# Patient Record
Sex: Female | Born: 1970
Health system: Southern US, Community
[De-identification: ages and names within clinical notes are randomized; demographics above are authoritative.]

## PROBLEM LIST (undated history)

## (undated) DIAGNOSIS — E785 Hyperlipidemia, unspecified: Secondary | ICD-10-CM

## (undated) DIAGNOSIS — C50919 Malignant neoplasm of unspecified site of unspecified female breast: Secondary | ICD-10-CM

## (undated) DIAGNOSIS — J45909 Unspecified asthma, uncomplicated: Secondary | ICD-10-CM

## (undated) DIAGNOSIS — M549 Dorsalgia, unspecified: Secondary | ICD-10-CM

## (undated) DIAGNOSIS — R51 Headache: Secondary | ICD-10-CM

## (undated) DIAGNOSIS — R519 Headache, unspecified: Secondary | ICD-10-CM

## (undated) DIAGNOSIS — C801 Malignant (primary) neoplasm, unspecified: Secondary | ICD-10-CM

## (undated) DIAGNOSIS — F329 Major depressive disorder, single episode, unspecified: Secondary | ICD-10-CM

## (undated) DIAGNOSIS — M810 Age-related osteoporosis without current pathological fracture: Secondary | ICD-10-CM

## (undated) DIAGNOSIS — I639 Cerebral infarction, unspecified: Secondary | ICD-10-CM

## (undated) DIAGNOSIS — T7840XA Allergy, unspecified, initial encounter: Secondary | ICD-10-CM

## (undated) DIAGNOSIS — F32A Depression, unspecified: Secondary | ICD-10-CM

## (undated) DIAGNOSIS — F419 Anxiety disorder, unspecified: Secondary | ICD-10-CM

## (undated) DIAGNOSIS — M8580 Other specified disorders of bone density and structure, unspecified site: Secondary | ICD-10-CM

## (undated) HISTORY — DX: Allergy, unspecified, initial encounter: T78.40XA

## (undated) HISTORY — DX: Age-related osteoporosis without current pathological fracture: M81.0

## (undated) HISTORY — DX: Major depressive disorder, single episode, unspecified: F32.9

## (undated) HISTORY — PX: LYMPHADENECTOMY: SHX15

## (undated) HISTORY — PX: ABDOMINAL HYSTERECTOMY: SHX81

## (undated) HISTORY — DX: Headache: R51

## (undated) HISTORY — DX: Headache, unspecified: R51.9

## (undated) HISTORY — DX: Anxiety disorder, unspecified: F41.9

## (undated) HISTORY — DX: Cerebral infarction, unspecified: I63.9

## (undated) HISTORY — PX: MASTECTOMY: SHX3

## (undated) HISTORY — PX: BREAST SURGERY: SHX581

## (undated) HISTORY — DX: Depression, unspecified: F32.A

## (undated) HISTORY — DX: Hyperlipidemia, unspecified: E78.5

## (undated) HISTORY — DX: Unspecified asthma, uncomplicated: J45.909

---

## 2000-04-27 ENCOUNTER — Emergency Department (HOSPITAL_COMMUNITY): Admission: EM | Admit: 2000-04-27 | Discharge: 2000-04-27 | Payer: Self-pay | Admitting: Emergency Medicine

## 2000-06-20 ENCOUNTER — Emergency Department (HOSPITAL_COMMUNITY): Admission: EM | Admit: 2000-06-20 | Discharge: 2000-06-20 | Payer: Self-pay | Admitting: *Deleted

## 2000-08-10 ENCOUNTER — Encounter: Payer: Self-pay | Admitting: *Deleted

## 2000-08-10 ENCOUNTER — Emergency Department (HOSPITAL_COMMUNITY): Admission: EM | Admit: 2000-08-10 | Discharge: 2000-08-10 | Payer: Self-pay | Admitting: *Deleted

## 2000-09-26 ENCOUNTER — Emergency Department (HOSPITAL_COMMUNITY): Admission: EM | Admit: 2000-09-26 | Discharge: 2000-09-26 | Payer: Self-pay | Admitting: Emergency Medicine

## 2001-04-17 ENCOUNTER — Emergency Department (HOSPITAL_COMMUNITY): Admission: EM | Admit: 2001-04-17 | Discharge: 2001-04-17 | Payer: Self-pay | Admitting: Emergency Medicine

## 2001-09-20 ENCOUNTER — Emergency Department (HOSPITAL_COMMUNITY): Admission: EM | Admit: 2001-09-20 | Discharge: 2001-09-20 | Payer: Self-pay | Admitting: Emergency Medicine

## 2002-04-04 ENCOUNTER — Emergency Department (HOSPITAL_COMMUNITY): Admission: EM | Admit: 2002-04-04 | Discharge: 2002-04-04 | Payer: Self-pay | Admitting: Emergency Medicine

## 2002-07-16 ENCOUNTER — Emergency Department (HOSPITAL_COMMUNITY): Admission: EM | Admit: 2002-07-16 | Discharge: 2002-07-16 | Payer: Self-pay | Admitting: Emergency Medicine

## 2002-07-16 ENCOUNTER — Encounter: Payer: Self-pay | Admitting: Emergency Medicine

## 2002-12-10 ENCOUNTER — Encounter: Admission: RE | Admit: 2002-12-10 | Discharge: 2002-12-10 | Payer: Self-pay | Admitting: Family Medicine

## 2002-12-10 ENCOUNTER — Encounter: Payer: Self-pay | Admitting: Family Medicine

## 2003-03-27 ENCOUNTER — Ambulatory Visit (HOSPITAL_COMMUNITY): Admission: RE | Admit: 2003-03-27 | Discharge: 2003-03-27 | Payer: Self-pay | Admitting: Obstetrics and Gynecology

## 2003-04-17 ENCOUNTER — Ambulatory Visit (HOSPITAL_COMMUNITY): Admission: AD | Admit: 2003-04-17 | Discharge: 2003-04-17 | Payer: Self-pay | Admitting: Internal Medicine

## 2003-06-04 ENCOUNTER — Ambulatory Visit (HOSPITAL_COMMUNITY): Admission: AD | Admit: 2003-06-04 | Discharge: 2003-06-04 | Payer: Self-pay | Admitting: Obstetrics and Gynecology

## 2003-07-05 ENCOUNTER — Ambulatory Visit (HOSPITAL_COMMUNITY): Admission: AD | Admit: 2003-07-05 | Discharge: 2003-07-05 | Payer: Self-pay | Admitting: Internal Medicine

## 2003-07-24 ENCOUNTER — Ambulatory Visit (HOSPITAL_COMMUNITY): Admission: RE | Admit: 2003-07-24 | Discharge: 2003-07-24 | Payer: Self-pay | Admitting: Obstetrics and Gynecology

## 2003-08-09 ENCOUNTER — Inpatient Hospital Stay (HOSPITAL_COMMUNITY): Admission: RE | Admit: 2003-08-09 | Discharge: 2003-08-12 | Payer: Self-pay | Admitting: Obstetrics and Gynecology

## 2004-07-27 ENCOUNTER — Emergency Department (HOSPITAL_COMMUNITY): Admission: EM | Admit: 2004-07-27 | Discharge: 2004-07-27 | Payer: Self-pay | Admitting: Family Medicine

## 2004-08-14 ENCOUNTER — Inpatient Hospital Stay (HOSPITAL_COMMUNITY): Admission: RE | Admit: 2004-08-14 | Discharge: 2004-08-17 | Payer: Self-pay | Admitting: Obstetrics and Gynecology

## 2004-09-26 ENCOUNTER — Ambulatory Visit (HOSPITAL_COMMUNITY): Admission: RE | Admit: 2004-09-26 | Discharge: 2004-09-26 | Payer: Self-pay | Admitting: Family Medicine

## 2005-03-21 ENCOUNTER — Ambulatory Visit: Payer: Self-pay | Admitting: Family Medicine

## 2005-03-28 ENCOUNTER — Ambulatory Visit: Payer: Self-pay | Admitting: Family Medicine

## 2005-04-05 ENCOUNTER — Ambulatory Visit: Payer: Self-pay | Admitting: Family Medicine

## 2005-04-24 ENCOUNTER — Encounter: Admission: RE | Admit: 2005-04-24 | Discharge: 2005-04-24 | Payer: Self-pay | Admitting: Family Medicine

## 2005-05-31 ENCOUNTER — Ambulatory Visit: Payer: Self-pay | Admitting: Family Medicine

## 2005-06-15 ENCOUNTER — Ambulatory Visit: Payer: Self-pay | Admitting: Family Medicine

## 2005-07-19 ENCOUNTER — Ambulatory Visit: Payer: Self-pay | Admitting: Family Medicine

## 2005-08-17 ENCOUNTER — Ambulatory Visit: Payer: Self-pay | Admitting: Family Medicine

## 2005-10-26 ENCOUNTER — Ambulatory Visit: Payer: Self-pay | Admitting: Family Medicine

## 2005-10-29 ENCOUNTER — Ambulatory Visit: Payer: Self-pay | Admitting: Family Medicine

## 2005-11-09 ENCOUNTER — Ambulatory Visit: Payer: Self-pay | Admitting: Family Medicine

## 2005-11-14 ENCOUNTER — Ambulatory Visit: Payer: Self-pay | Admitting: *Deleted

## 2006-02-06 ENCOUNTER — Ambulatory Visit: Payer: Self-pay | Admitting: Family Medicine

## 2006-02-12 ENCOUNTER — Ambulatory Visit: Payer: Self-pay | Admitting: Family Medicine

## 2006-02-27 ENCOUNTER — Ambulatory Visit: Payer: Self-pay | Admitting: *Deleted

## 2006-04-09 ENCOUNTER — Encounter (INDEPENDENT_AMBULATORY_CARE_PROVIDER_SITE_OTHER): Payer: Self-pay | Admitting: Internal Medicine

## 2006-04-09 LAB — CONVERTED CEMR LAB

## 2006-04-25 ENCOUNTER — Encounter: Admission: RE | Admit: 2006-04-25 | Discharge: 2006-04-25 | Payer: Self-pay | Admitting: Internal Medicine

## 2006-05-01 ENCOUNTER — Ambulatory Visit: Payer: Self-pay | Admitting: Family Medicine

## 2006-06-25 ENCOUNTER — Ambulatory Visit: Payer: Self-pay | Admitting: Internal Medicine

## 2006-07-12 ENCOUNTER — Ambulatory Visit: Payer: Self-pay | Admitting: Internal Medicine

## 2006-08-02 ENCOUNTER — Ambulatory Visit: Payer: Self-pay | Admitting: Internal Medicine

## 2006-09-02 ENCOUNTER — Emergency Department (HOSPITAL_COMMUNITY): Admission: EM | Admit: 2006-09-02 | Discharge: 2006-09-02 | Payer: Self-pay | Admitting: Family Medicine

## 2006-10-24 ENCOUNTER — Ambulatory Visit: Payer: Self-pay | Admitting: Internal Medicine

## 2006-10-25 ENCOUNTER — Encounter (INDEPENDENT_AMBULATORY_CARE_PROVIDER_SITE_OTHER): Payer: Self-pay | Admitting: Internal Medicine

## 2006-10-25 LAB — CONVERTED CEMR LAB
ALT: 33 units/L
AST: 21 units/L
Albumin: 4.6 g/dL
Alkaline Phosphatase: 44 units/L
BUN: 13 mg/dL
Basophils Absolute: 0 10*3/uL
Basophils Relative: 1 %
CO2: 24 meq/L
Calcium: 9.7 mg/dL
Chloride: 105 meq/L
Cholesterol: 144 mg/dL
Creatinine, Ser: 0.79 mg/dL
Eosinophils Absolute: 0.1 10*3/uL
Eosinophils Relative: 2 %
Glucose, Bld: 119 mg/dL
HCT: 44.4 %
HDL: 30 mg/dL
Hemoglobin: 13.6 g/dL
LDL Cholesterol: 79 mg/dL
Lymphocytes Relative: 25 %
Lymphs Abs: 1.9 10*3/uL
MCHC: 30.6 g/dL
MCV: 90.2 fL
Monocytes Absolute: 0.5 10*3/uL
Monocytes Relative: 7 %
Neutro Abs: 4.9 10*3/uL
Neutrophils Relative %: 67 %
Platelets: 289 10*3/uL
Potassium: 4.6 meq/L
RBC: 4.92 M/uL
RDW: 15.8 %
Sodium: 142 meq/L
Total Bilirubin: 0.7 mg/dL
Total CHOL/HDL Ratio: 4.8
Total Protein: 7.5 g/dL
Triglycerides: 176 mg/dL
VLDL: 35 mg/dL
WBC: 7.4 10*3/uL

## 2006-12-25 ENCOUNTER — Encounter (INDEPENDENT_AMBULATORY_CARE_PROVIDER_SITE_OTHER): Payer: Self-pay | Admitting: *Deleted

## 2006-12-26 ENCOUNTER — Emergency Department (HOSPITAL_COMMUNITY): Admission: EM | Admit: 2006-12-26 | Discharge: 2006-12-26 | Payer: Self-pay | Admitting: Emergency Medicine

## 2006-12-30 ENCOUNTER — Encounter (INDEPENDENT_AMBULATORY_CARE_PROVIDER_SITE_OTHER): Payer: Self-pay | Admitting: Internal Medicine

## 2006-12-30 DIAGNOSIS — F329 Major depressive disorder, single episode, unspecified: Secondary | ICD-10-CM

## 2006-12-30 DIAGNOSIS — Q078 Other specified congenital malformations of nervous system: Secondary | ICD-10-CM

## 2006-12-30 DIAGNOSIS — D539 Nutritional anemia, unspecified: Secondary | ICD-10-CM | POA: Insufficient documentation

## 2006-12-30 DIAGNOSIS — E119 Type 2 diabetes mellitus without complications: Secondary | ICD-10-CM

## 2006-12-30 DIAGNOSIS — M949 Disorder of cartilage, unspecified: Secondary | ICD-10-CM

## 2006-12-30 DIAGNOSIS — A6000 Herpesviral infection of urogenital system, unspecified: Secondary | ICD-10-CM | POA: Insufficient documentation

## 2006-12-30 DIAGNOSIS — M899 Disorder of bone, unspecified: Secondary | ICD-10-CM | POA: Insufficient documentation

## 2006-12-30 LAB — CONVERTED CEMR LAB
Bilirubin Urine: NEGATIVE
Blood in Urine, dipstick: NEGATIVE
Glucose, Urine, Semiquant: >=1000
Ketones, urine, test strip: NEGATIVE
Nitrite: NEGATIVE
Protein, U semiquant: NEGATIVE
Specific Gravity, Urine: >=1.03
Urobilinogen, UA: 0.2
WBC Urine, dipstick: NEGATIVE
pH: 6

## 2007-02-19 ENCOUNTER — Ambulatory Visit: Payer: Self-pay | Admitting: Nurse Practitioner

## 2007-02-19 DIAGNOSIS — K589 Irritable bowel syndrome without diarrhea: Secondary | ICD-10-CM

## 2007-02-19 DIAGNOSIS — G5600 Carpal tunnel syndrome, unspecified upper limb: Secondary | ICD-10-CM

## 2007-02-19 DIAGNOSIS — K219 Gastro-esophageal reflux disease without esophagitis: Secondary | ICD-10-CM

## 2007-02-19 DIAGNOSIS — F341 Dysthymic disorder: Secondary | ICD-10-CM

## 2007-02-19 DIAGNOSIS — J309 Allergic rhinitis, unspecified: Secondary | ICD-10-CM | POA: Insufficient documentation

## 2007-02-19 LAB — CONVERTED CEMR LAB
Blood Glucose, Fingerstick: 352
Hgb A1c MFr Bld: 8.7 %

## 2007-04-28 ENCOUNTER — Encounter (INDEPENDENT_AMBULATORY_CARE_PROVIDER_SITE_OTHER): Payer: Self-pay | Admitting: Internal Medicine

## 2007-04-30 ENCOUNTER — Encounter: Admission: RE | Admit: 2007-04-30 | Discharge: 2007-04-30 | Payer: Self-pay | Admitting: Family Medicine

## 2007-05-05 ENCOUNTER — Encounter (INDEPENDENT_AMBULATORY_CARE_PROVIDER_SITE_OTHER): Payer: Self-pay | Admitting: Diagnostic Radiology

## 2007-05-05 ENCOUNTER — Encounter: Admission: RE | Admit: 2007-05-05 | Discharge: 2007-05-05 | Payer: Self-pay | Admitting: Family Medicine

## 2007-06-27 ENCOUNTER — Encounter (INDEPENDENT_AMBULATORY_CARE_PROVIDER_SITE_OTHER): Payer: Self-pay | Admitting: Internal Medicine

## 2008-02-01 ENCOUNTER — Emergency Department (HOSPITAL_COMMUNITY): Admission: EM | Admit: 2008-02-01 | Discharge: 2008-02-01 | Payer: Self-pay | Admitting: Emergency Medicine

## 2008-02-12 ENCOUNTER — Emergency Department (HOSPITAL_COMMUNITY): Admission: EM | Admit: 2008-02-12 | Discharge: 2008-02-12 | Payer: Self-pay | Admitting: Emergency Medicine

## 2008-06-10 ENCOUNTER — Encounter: Admission: RE | Admit: 2008-06-10 | Discharge: 2008-06-10 | Payer: Self-pay | Admitting: Family Medicine

## 2008-07-19 ENCOUNTER — Encounter: Payer: Self-pay | Admitting: Internal Medicine

## 2008-07-22 ENCOUNTER — Ambulatory Visit (HOSPITAL_COMMUNITY): Admission: RE | Admit: 2008-07-22 | Discharge: 2008-07-22 | Payer: Self-pay | Admitting: Emergency Medicine

## 2008-09-03 ENCOUNTER — Inpatient Hospital Stay (HOSPITAL_COMMUNITY): Admission: EM | Admit: 2008-09-03 | Discharge: 2008-09-05 | Payer: Self-pay | Admitting: Emergency Medicine

## 2008-09-08 ENCOUNTER — Encounter: Payer: Self-pay | Admitting: Internal Medicine

## 2008-09-10 ENCOUNTER — Encounter: Payer: Self-pay | Admitting: Internal Medicine

## 2008-09-20 ENCOUNTER — Encounter: Payer: Self-pay | Admitting: Internal Medicine

## 2008-10-22 ENCOUNTER — Encounter: Admission: RE | Admit: 2008-10-22 | Discharge: 2008-10-22 | Payer: Self-pay | Admitting: Pediatrics

## 2008-10-28 ENCOUNTER — Ambulatory Visit: Payer: Self-pay | Admitting: Internal Medicine

## 2008-10-28 DIAGNOSIS — I10 Essential (primary) hypertension: Secondary | ICD-10-CM | POA: Insufficient documentation

## 2008-10-28 DIAGNOSIS — R0602 Shortness of breath: Secondary | ICD-10-CM

## 2008-11-09 ENCOUNTER — Ambulatory Visit (HOSPITAL_COMMUNITY): Admission: RE | Admit: 2008-11-09 | Discharge: 2008-11-09 | Payer: Self-pay | Admitting: Internal Medicine

## 2008-11-09 ENCOUNTER — Encounter: Payer: Self-pay | Admitting: Internal Medicine

## 2008-11-24 ENCOUNTER — Ambulatory Visit: Payer: Self-pay | Admitting: Internal Medicine

## 2008-11-30 ENCOUNTER — Encounter (HOSPITAL_COMMUNITY): Admission: RE | Admit: 2008-11-30 | Discharge: 2008-12-30 | Payer: Self-pay

## 2008-12-07 ENCOUNTER — Encounter: Payer: Self-pay | Admitting: Internal Medicine

## 2008-12-07 ENCOUNTER — Ambulatory Visit (HOSPITAL_COMMUNITY): Admission: RE | Admit: 2008-12-07 | Discharge: 2008-12-07 | Payer: Self-pay | Admitting: Internal Medicine

## 2008-12-09 ENCOUNTER — Ambulatory Visit: Payer: Self-pay | Admitting: Internal Medicine

## 2008-12-30 ENCOUNTER — Ambulatory Visit: Payer: Self-pay | Admitting: Internal Medicine

## 2009-02-04 ENCOUNTER — Encounter: Admission: RE | Admit: 2009-02-04 | Discharge: 2009-04-06 | Payer: Self-pay | Admitting: Family Medicine

## 2009-03-05 ENCOUNTER — Emergency Department (HOSPITAL_COMMUNITY): Admission: EM | Admit: 2009-03-05 | Discharge: 2009-03-06 | Payer: Self-pay | Admitting: Emergency Medicine

## 2009-05-24 ENCOUNTER — Encounter: Payer: Self-pay | Admitting: Endocrinology

## 2009-08-26 ENCOUNTER — Encounter: Payer: Self-pay | Admitting: Endocrinology

## 2009-09-29 ENCOUNTER — Emergency Department (HOSPITAL_COMMUNITY): Admission: EM | Admit: 2009-09-29 | Discharge: 2009-09-30 | Payer: Self-pay | Admitting: Emergency Medicine

## 2009-10-19 ENCOUNTER — Ambulatory Visit: Payer: Self-pay | Admitting: Endocrinology

## 2009-10-27 ENCOUNTER — Ambulatory Visit: Payer: Self-pay | Admitting: Endocrinology

## 2009-12-29 ENCOUNTER — Ambulatory Visit: Payer: Self-pay | Admitting: Endocrinology

## 2010-01-06 ENCOUNTER — Telehealth: Payer: Self-pay | Admitting: Endocrinology

## 2010-01-08 ENCOUNTER — Emergency Department (HOSPITAL_COMMUNITY): Admission: EM | Admit: 2010-01-08 | Discharge: 2010-01-08 | Payer: Self-pay | Admitting: Emergency Medicine

## 2010-01-08 ENCOUNTER — Encounter: Payer: Self-pay | Admitting: Endocrinology

## 2010-01-11 ENCOUNTER — Ambulatory Visit: Payer: Self-pay | Admitting: Endocrinology

## 2010-01-16 ENCOUNTER — Telehealth: Payer: Self-pay | Admitting: Internal Medicine

## 2010-01-24 ENCOUNTER — Emergency Department (HOSPITAL_COMMUNITY): Admission: EM | Admit: 2010-01-24 | Discharge: 2010-01-25 | Payer: Self-pay | Admitting: Emergency Medicine

## 2010-04-30 ENCOUNTER — Encounter: Payer: Self-pay | Admitting: Family Medicine

## 2010-05-09 NOTE — Assessment & Plan Note (Signed)
Summary: NEW ENDO CON/ DM/ MEDICARE/ MEDICAID [place]  /3   Vital Signs:  Patient profile:   40 year old female Menstrual status:  hysterectomy Height:      61 inches (154.94 cm) Weight:      242.50 pounds (110.23 kg) BMI:     45.99 O2 Sat:      98 % on Room air Temp:     98.2 degrees F (36.78 degrees C) oral Pulse rate:   79 / minute BP sitting:   132 / 76  (left arm) Cuff size:   large  Vitals Entered By: Brenton Grills MA (October 27, 2009 3:18 PM)  O2 Flow:  Room air CC: New Endo/Diabetes/wants to discuss insulin/pt is also taking Januvia and Diflucan/aj     Menstrual Status hysterectomy Last PAP Result Done   Referring Provider:  Dr. Leo Grosser Primary Provider:  Dr. Lynnea Ferrier  CC:  New Endo/Diabetes/wants to discuss insulin/pt is also taking Januvia and Diflucan/aj.  History of Present Illness: pt states 5 years h/o dm.  she is unaware of any chronic complications.  she has been on insulin x 2 years.  she takes lantus and novolog.  no cbg record, but states cbg's vary from 113-400.  she is unaware of any pattern throughout the day.  she says she does not check cbg enough to tell any pattern.     pt says his diet is "not good," and exercise is "fair."   symptomatically, pt states slight burning-quality pain of the feet in the context of walking, but no associated numbness.    Current Medications (verified): 1)  Nexium 40 Mg Cpdr (Esomeprazole Magnesium) .... Take 1 Tablet By Mouth Once A Day 2)  Actos 30 Mg Tabs (Pioglitazone Hcl) .... Take 1 Tablet By Mouth Once A Day 3)  Lantus 100 Unit/ml Soln (Insulin Glargine) .... 70 Units Twice A Day 4)  Novolog 100 Unit/ml Soln (Insulin Aspart) .... 25 Units Three Times A Day 5)  Lisinopril 10 Mg Tabs (Lisinopril) .... Take 1 Tablet By Mouth Once A Day 6)  Xanax 1 Mg Tabs (Alprazolam) .... Take 1 Tablet By Mouth Three Times A Day 7)  Tricor 145 Mg Tabs (Fenofibrate) .... Take 1 Tablet By Mouth Once A Day 8)   Metformin Hcl 850 Mg Tabs (Metformin Hcl) .Marland Kitchen.. 1 Tablet Once Daily 9)  Crestor 20 Mg Tabs (Rosuvastatin Calcium) .Marland Kitchen.. 1 Tablet Once Daily 10)  Pristiq 100 Mg Xr24h-Tab (Desvenlafaxine Succinate) .Marland Kitchen.. 1 Tablet Two Times A Day  Allergies (verified): 1)  ! Penicillin 2)  ! Zithromax 3)  ! Sulfa 4)  ! Macrobid  Past History:  Past Medical History: Last updated: 12/30/2008 #Asthma at age 66 and then grew out of it >negative methacholine 11/09/2008 >CPST 12/07/2008: Obesity cause of dyspnea #Allergic Rhinitis #Diabetes #Hyperlipidemia #Hypertension #Chronic Headaches #Morbid Obesity - BMI 51 >denies wt loss drugs #Breast CA...Marland KitchenDr Dennison Nancy Marcum >dx 05/06/2007. s/p double mastectomy >last chemo 07/29/2007 >in complete remission: last ct and echo at [facility] was 09/10/2008 - normal #Labs: 07/20/2008 Creatinine 0.59, Albumin 4.7, Hemoglobin 13.8,   LABS 09/20/2008 - outside spirometry normal  07/19/2008 - hgb 13.8gm%, creat 0.6mg %, alb >4gm%, TSH 1.28 09/10/08 echo - normal ([facility]) 09/10/2008 - ct chest, abd, pelvis ([facility], compared to 02/27/2008) - normal parencyhuma, no nodes  Family History: Reviewed history from 10/28/2008 and no changes required. Father-heart disease Mother- Breast Ca MGM-Breast CA MAunt-Breast CA MGF-pancreatic ASthma - 3 of  4 kids dm:  not  in immediate family  Social History: Reviewed history from 10/28/2008 and no changes required. Single Has four children Diability due to back pain and cancer Patient states former smoker.  Quit in 2006, smoke x 60yrs, avg. 1ppd Alcohol Use - no Regular Exercise - yes Does Patient Exercise:  yes  Review of Systems       The patient complains of weight gain, chest pain, dyspnea on exertion, headaches, and depression.         denies weight loss, blurry vision, urinary frequency, menopausal sxs, and rhinorrhea.  she reports nausea, but no vomiting.  she has leg cramps, memory loss, easy bruising, and excessive  diaphoresis.  Physical Exam  General:  morbidly obese.   Head:  head: no deformity eyes: no periorbital swelling, no proptosis external nose and ears are normal mouth: no lesion seen Neck:  Supple without thyroid enlargement or tenderness.  Lungs:  Clear to auscultation bilaterally. Normal respiratory effort.  Heart:  Regular rate and rhythm without murmurs or gallops noted. Normal S1,S2.   Abdomen:  abdomen is soft, nontender.  no hepatosplenomegaly.   not distended.  no hernia morbid obesity.   Msk:  muscle bulk and strength are grossly normal.  no obvious joint swelling.  gait is normal and steady  Pulses:  dorsalis pedis intact bilat.  no carotid bruit  Extremities:  no deformity.  no ulcer on the feet.  feet are of normal color and temp.  trace right pedal edema and trace left pedal edema.   Neurologic:  cn 2-12 grossly intact.   readily moves all 4's.   sensation is intact to touch on the feet  Skin:  normal texture and temp.  no rash.  not diaphoretic  Cervical Nodes:  No significant adenopathy.  Psych:  Alert and cooperative; normal mood and affect; normal attention span and concentration.   Additional Exam:  outside test results are reviewed:  a1c=7.8 tg over 400    Impression & Recommendations:  Problem # 1:  DM (ICD-250.00) needs increased rx the next step is to determine cbg pattern throughout the day  Problem # 2:  HYPERLIPIDEMIA (ICD-272.4) this will improve with improvement of dm  Problem # 3:  foot pain ? related to #1  Medications Added to Medication List This Visit: 1)  Metformin Hcl 850 Mg Tabs (Metformin hcl) .Marland Kitchen.. 1 tablet once daily 2)  Crestor 20 Mg Tabs (Rosuvastatin calcium) .Marland Kitchen.. 1 tablet once daily 3)  Pristiq 100 Mg Xr24h-tab (Desvenlafaxine succinate) .Marland Kitchen.. 1 tablet two times a day  Other Orders: New Patient Level IV (40981)  Patient Instructions: 1)  good diet and exercise habits significanly improve the control of your diabetes.   please let me know if you wish to be referred to a dietician.  high blood sugar is very risky to your health.  you should see an eye doctor every year. 2)  controlling your blood pressure and cholesterol drastically reduces the damage diabetes does to your body.  this also applies to quitting smoking.  please discuss these with your doctor.  you should take an aspirin every day, unless you have been advised by a doctor not to. 3)  check your blood sugar 1-2 times a day.  vary the time of day when you check, between before the 3 meals, and at bedtime.  also check if you have symptoms of your blood sugar being too high or too low.  please keep a record of the readings and bring it to your  next appointment here.  please call us sooner if you are having low blood sugar episodes. 4)  we will need to take this complex situation in stages. 5)  Please schedule a follow-up appointment in 2-3 weeks. 6)  please continue the same insulin for now.

## 2010-05-09 NOTE — Assessment & Plan Note (Signed)
Summary: elev sugar/#cd   Vital Signs:  Patient profile:   40 year old female Menstrual status:  hysterectomy Height:      61 inches (154.94 cm) Weight:      231.13 pounds (105.06 kg) BMI:     43.83 O2 Sat:      97 % Temp:     97.7 degrees F (36.50 degrees C) oral Pulse rate:   86 / minute BP sitting:   138 / 86  (left arm) Cuff size:   regular (left leg)  Vitals Entered By: Jarome Lamas (January 11, 2010 9:13 AM)  Referring Provider:  Dr. Leo Grosser Primary Provider:  Dr. Lynnea Ferrier   History of Present Illness: she takes levemir 180 units once daily.  she brings a record of her cbg's which i have reviewed today.  it varies from 245-594.  it is in general higher later in the day.  denies n/v/sob.    Current Medications (verified): 1)  Nexium 40 Mg Cpdr (Esomeprazole Magnesium) .... Take 1 Tablet By Mouth Once A Day 2)  Novolog 100 Unit/ml Soln (Insulin Aspart) .... 25 Units Three Times A Day 3)  Xanax 1 Mg Tabs (Alprazolam) .... Take 1 Tablet By Mouth Three Times A Day 4)  Tricor 145 Mg Tabs (Fenofibrate) .... Take 1 Tablet By Mouth Once A Day 5)  Crestor 20 Mg Tabs (Rosuvastatin Calcium) .Marland Kitchen.. 1 Tablet Once Daily 6)  Pristiq 100 Mg Xr24h-Tab (Desvenlafaxine Succinate) .Marland Kitchen.. 1 Tablet Two Times A Day 7)  Arimidex 1 Mg Tabs (Anastrozole) .Marland Kitchen.. 1 Tablet By Mouth Once Daily 8)  Vitamin D3 50000 Unit Caps (Cholecalciferol) .Marland Kitchen.. 1 By Mouth Once Weekly 9)  Vicodin 5-500 Mg Tabs (Hydrocodone-Acetaminophen) .Marland Kitchen.. 1 By Mouth As Needed For Pain 10)  Levemir Flexpen 100 Unit/ml Soln (Insulin Detemir) .Marland KitchenMarland KitchenMarland Kitchen 180 Units Each Am, and Pen Needles Once Daily 11)  Geodon 80 Mg Caps (Ziprasidone Hcl) .Marland Kitchen.. 1 By Mouth Once Daily  Allergies (verified): 1)  ! Penicillin 2)  ! Zithromax 3)  ! Sulfa 4)  ! Macrobid  Past History:  Past Medical History: Last updated: 12/30/2008 #Asthma at age 50 and then grew out of it >negative methacholine 11/09/2008 >CPST 12/07/2008: Obesity cause of  dyspnea #Allergic Rhinitis #Diabetes #Hyperlipidemia #Hypertension #Chronic Headaches #Morbid Obesity - BMI 51 >denies wt loss drugs #Breast CA...Marland KitchenDr Dennison Nancy Marcum >dx 05/06/2007. s/p double mastectomy >last chemo 07/29/2007 >in complete remission: last ct and echo at [facility] was 09/10/2008 - normal #Labs: 07/20/2008 Creatinine 0.59, Albumin 4.7, Hemoglobin 13.8,   LABS 09/20/2008 - outside spirometry normal  07/19/2008 - hgb 13.8gm%, creat 0.6mg %, alb >4gm%, TSH 1.28 09/10/08 echo - normal ([facility]) 09/10/2008 - ct chest, abd, pelvis ([facility], compared to 02/27/2008) - normal parencyhuma, no nodes  Review of Systems  The patient denies hypoglycemia.    Physical Exam  General:  obese.  no distress  Msk:  gait is normal and steady. Psych:  Alert and cooperative; normal mood and affect; normal attention span and concentration.     Impression & Recommendations:  Problem # 1:  DM (ICD-250.00) needs increased rx  Medications Added to Medication List This Visit: 1)  Vitamin D3 50000 Unit Caps (Cholecalciferol) .Marland Kitchen.. 1 by mouth once weekly 2)  Levemir Flexpen 100 Unit/ml Soln (Insulin detemir) .... 250 units each am, and pen needles once daily 3)  Geodon 80 Mg Caps (Ziprasidone hcl) .Marland Kitchen.. 1 by mouth once daily  Other Orders: Est. Patient Level III (16109)  Patient Instructions:  1)  increase levemir to 250 units each am. 2)  if blood sugar does not improve to 100's at some time of day, call sooner.  3)  Please schedule a follow-up appointment in 2 weeks.   Prescriptions: LEVEMIR FLEXPEN 100 UNIT/ML SOLN (INSULIN DETEMIR) 250 units each am, and pen needles once daily  #6 boxes x 11   Entered and Authorized by:   Minus Breeding MD   Signed by:   Minus Breeding MD on 01/11/2010   Method used:   Electronically to        [facility]  [address] [phone][facility] (retail)       [address]       [place], [place]  [address]       Ph: [phone]       Fax: [phone]   RxID:    [id number]

## 2010-05-09 NOTE — Progress Notes (Signed)
Summary: high BS  Phone Note Call from Patient Call back at Home Phone [phone]   Caller: Patient Call For: Dr. Everardo All Reason for Call: Talk to Doctor Summary of Call: Pt states Blood sugars are still running high. MD rx new insulin Levemir 150 units once daily. BS still running from 300-400. Pt states this am it was 489. Pls advise Initial call taken by: Orlan Leavens RMA,  January 06, 2010 3:24 PM  Follow-up for Phone Call        increase to 180 units each am.  ret as scheduled.  1 time only, take 20 units this afternoon, to improve it. Follow-up by: Minus Breeding MD,  January 06, 2010 3:43 PM  Additional Follow-up for Phone Call Additional follow up Details #1::        Notified pt with md recommendations Additional Follow-up by: Orlan Leavens RMA,  January 06, 2010 3:50 PM    New/Updated Medications: LEVEMIR FLEXPEN 100 UNIT/ML SOLN (INSULIN DETEMIR) 180 units each am, and pen needles once daily

## 2010-05-09 NOTE — Letter (Signed)
[facility]: [facility]   Imported By: Lester Wessington 01/13/2010 07:15:45  _____________________________________________________________________  External Attachment:    Type:   Image     Comment:   External Document

## 2010-05-09 NOTE — Assessment & Plan Note (Signed)
Summary: FU Natale Milch  #   Vital Signs:  Patient profile:   40 year old female Menstrual status:  hysterectomy Height:      61 inches (154.94 cm) Weight:      233.75 pounds (106.25 kg) BMI:     44.33 O2 Sat:      98 % on Room air Temp:     98.6 degrees F (37.00 degrees C) oral Pulse rate:   87 / minute BP sitting:   126 / 82 Cuff size:   large  Vitals Entered By: Brenton Grills MA (December 29, 2009 8:24 AM)  O2 Flow:  Room air CC: F/U appt A1C/pt is no longer taking Actos or Lisinopril/aj Is Patient Diabetic? Yes   Referring Provider:  Dr. Leo Grosser Primary Provider:  Dr. Lynnea Ferrier  CC:  F/U appt A1C/pt is no longer taking Actos or Lisinopril/aj.  History of Present Illness: pt says she forgets her insulin "a lot."  she wants to reduce insulin injections to once daily only.  she brings a record of her cbg's which i have reviewed today.  it varies from 147-500 (the 147 was after taking 50 units of novolog, in addition to lantus).  Current Medications (verified): 1)  Nexium 40 Mg Cpdr (Esomeprazole Magnesium) .... Take 1 Tablet By Mouth Once A Day 2)  Actos 30 Mg Tabs (Pioglitazone Hcl) .... Take 1 Tablet By Mouth Once A Day 3)  Lantus 100 Unit/ml Soln (Insulin Glargine) .... 70 Units Twice A Day 4)  Novolog 100 Unit/ml Soln (Insulin Aspart) .... 25 Units Three Times A Day 5)  Lisinopril 10 Mg Tabs (Lisinopril) .... Take 1 Tablet By Mouth Once A Day 6)  Xanax 1 Mg Tabs (Alprazolam) .... Take 1 Tablet By Mouth Three Times A Day 7)  Tricor 145 Mg Tabs (Fenofibrate) .... Take 1 Tablet By Mouth Once A Day 8)  Metformin Hcl 850 Mg Tabs (Metformin Hcl) .Marland Kitchen.. 1 Tablet Once Daily 9)  Crestor 20 Mg Tabs (Rosuvastatin Calcium) .Marland Kitchen.. 1 Tablet Once Daily 10)  Pristiq 100 Mg Xr24h-Tab (Desvenlafaxine Succinate) .Marland Kitchen.. 1 Tablet Two Times A Day 11)  Januvia 25 Mg Tabs (Sitagliptin Phosphate) .Marland Kitchen.. 1 By Mouth Once Daily 12)  Arimidex 1 Mg Tabs (Anastrozole) .Marland Kitchen.. 1 Tablet By Mouth Once  Daily 13)  Vitamin D 400 Unit Caps (Cholecalciferol) .Marland Kitchen.. 1 By Mouth Once Weekly 14)  Vicodin 5-500 Mg Tabs (Hydrocodone-Acetaminophen) .Marland Kitchen.. 1 By Mouth As Needed For Pain  Allergies (verified): 1)  ! Penicillin 2)  ! Zithromax 3)  ! Sulfa 4)  ! Macrobid  Past History:  Past Medical History: Last updated: 12/30/2008 #Asthma at age 68 and then grew out of it >negative methacholine 11/09/2008 >CPST 12/07/2008: Obesity cause of dyspnea #Allergic Rhinitis #Diabetes #Hyperlipidemia #Hypertension #Chronic Headaches #Morbid Obesity - BMI 51 >denies wt loss drugs #Breast CA...Marland KitchenDr Dennison Nancy Marcum >dx 05/06/2007. s/p double mastectomy >last chemo 07/29/2007 >in complete remission: last ct and echo at [facility] was 09/10/2008 - normal #Labs: 07/20/2008 Creatinine 0.59, Albumin 4.7, Hemoglobin 13.8,   LABS 09/20/2008 - outside spirometry normal  07/19/2008 - hgb 13.8gm%, creat 0.6mg %, alb >4gm%, TSH 1.28 09/10/08 echo - normal ([facility]) 09/10/2008 - ct chest, abd, pelvis ([facility], compared to 02/27/2008) - normal parencyhuma, no nodes  Review of Systems  The patient denies hypoglycemia.    Physical Exam  General:  obese.  no distress  Skin:  insulin injection sites at anterior abdomen are normal    Impression & Recommendations:  Problem #  1:  DM (ICD-250.00) therapy limited by noncompliance.  i'll do the best i can. she needs a simpler (once daily) regimen  Medications Added to Medication List This Visit: 1)  Januvia 25 Mg Tabs (Sitagliptin phosphate) .Marland Kitchen.. 1 by mouth once daily 2)  Arimidex 1 Mg Tabs (Anastrozole) .Marland Kitchen.. 1 tablet by mouth once daily 3)  Vitamin D 400 Unit Caps (Cholecalciferol) .Marland Kitchen.. 1 by mouth once weekly 4)  Vicodin 5-500 Mg Tabs (Hydrocodone-acetaminophen) .Marland Kitchen.. 1 by mouth as needed for pain 5)  Levemir Flexpen 100 Unit/ml Soln (Insulin detemir) .Marland KitchenMarland KitchenMarland Kitchen 150 units each am, and pen needles once daily  Other Orders: Est. Patient Level III (16109) Flu Vaccine 98yrs + MEDICARE  PATIENTS (U0454) Administration Flu vaccine - MCR (G0008) Est. Patient Level III (09811)  Patient Instructions: 1)  stop [place] and merformin. 2)  change lantus to levemir, 150 units each am. 3)  Please schedule a follow-up appointment in 2 weeks. Prescriptions: LEVEMIR FLEXPEN 100 UNIT/ML SOLN (INSULIN DETEMIR) 150 units each am, and pen needles once daily  #4 boxes x 11   Entered and Authorized by:   Minus Breeding MD   Signed by:   Minus Breeding MD on 12/29/2009   Method used:   [facility]        [facility]  [address] [phone][facility] (retail)       [address]       [place], [place]  [address]       Ph: [phone]       Fax: [phone]   RxID:   [id number]  Flu Vaccine Consent Questions     Do you have a history of severe allergic reactions to this vaccine? no    Any prior history of allergic reactions to egg and/or gelatin? no    Do you have a sensitivity to the preservative Thimersol? no    Do you have a past history of Guillan-Barre Syndrome? no    Do you currently have an acute febrile illness? no    Have you ever had a severe reaction to latex? no    Vaccine information given and explained to patient? yes    Are you currently pregnant? no    Lot Number:[id number]   Exp Date:10/07/2010   Site Given  Left Deltoid IMdflu

## 2010-05-09 NOTE — Progress Notes (Signed)
Summary: Med adj/SAE pt  Phone Note Call from Patient Call back at Home Phone [phone]   Caller: Patient Summary of Call: Pt called stating her CBGS are still elevated at 300+. Per last OV pt was advised to call back if CBG do not decrease to 100 at any point during the day. I call pt back and advised that SAE was out of office until Wednesday morning and pt should contact her PCP. Pt refused stating that her PCP will not "touch 250u if Levermir" and "what am I supposed to do, keep going to the ER?" I explained to pt that there is no other ENDO MD here only IM. Pt again refused to contact her PCP and requests that LB MD advise. Initial call taken by: Margaret Pyle, CMA,  January 16, 2010 9:03 AM  Follow-up for Phone Call        continue the levemir 250u/d and resume novolog 20units before each meal, may hold novolog if cbg<150 - i reviewed last OV from SAE and recognize pt only wants one insulin shot per day but this may not be possible - novolog is a short fix until SAE returns - may send erx for more novolog if pt does not still have novolog at home Follow-up by: Newt Lukes MD,  January 16, 2010 9:36 AM  Additional Follow-up for Phone Call Additional follow up Details #1::        Pt refused to use Novolog, stating that she will wait and call back to speak with SAE 01/18/10 Additional Follow-up by: Margaret Pyle, CMA,  January 16, 2010 9:44 AM    New/Updated Medications: NOVOLOG 100 UNIT/ML SOLN (INSULIN ASPART) 20units subcutaneously three times a day before each meal

## 2010-05-09 NOTE — Letter (Signed)
Summary: [facility]   Imported By: Lester Mission 01/13/2010 07:18:20  _____________________________________________________________________  External Attachment:    Type:   Image     Comment:   External Document

## 2010-05-09 NOTE — Letter (Signed)
Summary: [facility]  [facility]   Imported By: Sherian Rein 10/31/2009 11:16:14  _____________________________________________________________________  External Attachment:    Type:   Image     Comment:   External Document

## 2010-06-21 LAB — CBC
Hemoglobin: 13.7 g/dL (ref 12.0–15.0)
Hemoglobin: 12.9 g/dL (ref 12.0–15.0)
MCH: 29.1 pg (ref 26.0–34.0)
RBC: 4.43 MIL/uL (ref 3.87–5.11)
RDW: 15 % (ref 11.5–15.5)
WBC: 9.1 10*3/uL (ref 4.0–10.5)
WBC: 9.3 10*3/uL (ref 4.0–10.5)

## 2010-06-21 LAB — BASIC METABOLIC PANEL
BUN: 7 mg/dL (ref 6–23)
CO2: 26 mEq/L (ref 19–32)
CO2: 26 mEq/L (ref 19–32)
Calcium: 9.7 mg/dL (ref 8.4–10.5)
Chloride: 99 mEq/L (ref 96–112)
Creatinine, Ser: 0.52 mg/dL (ref 0.4–1.2)
GFR calc Af Amer: >60 mL/min (ref >60)
GFR calc Af Amer: >60 mL/min (ref >60)
GFR calc non Af Amer: >60 mL/min (ref >60)
GFR calc non Af Amer: >60 mL/min (ref >60)
Glucose, Bld: 406 mg/dL — ABNORMAL HIGH (ref 70–99)
Sodium: 134 mEq/L — ABNORMAL LOW (ref 135–145)
Sodium: 130 mEq/L — ABNORMAL LOW (ref 135–145)

## 2010-06-21 LAB — GLUCOSE, CAPILLARY
Glucose-Capillary: 426 mg/dL — ABNORMAL HIGH (ref 70–99)
Glucose-Capillary: 245 mg/dL — ABNORMAL HIGH (ref 70–99)
Glucose-Capillary: 304 mg/dL — ABNORMAL HIGH (ref 70–99)

## 2010-06-21 LAB — URINE MICROSCOPIC-ADD ON

## 2010-06-21 LAB — URINALYSIS, ROUTINE W REFLEX MICROSCOPIC
Glucose, UA: >1000 mg/dL — AB
Glucose, UA: >1000 mg/dL — AB
Hgb urine dipstick: NEGATIVE
Leukocytes, UA: NEGATIVE
Leukocytes, UA: NEGATIVE
Protein, ur: NEGATIVE mg/dL
Specific Gravity, Urine: 1.01 (ref 1.005–1.030)
pH: 5.5 (ref 5.0–8.0)
pH: 5.5 (ref 5.0–8.0)

## 2010-06-21 LAB — DIFFERENTIAL
Eosinophils Relative: 1 % (ref 0–5)
Lymphocytes Relative: 39 % (ref 12–46)
Lymphocytes Relative: 29 % (ref 12–46)
Lymphs Abs: 3.6 10*3/uL (ref 0.7–4.0)
Lymphs Abs: 2.7 10*3/uL (ref 0.7–4.0)
Monocytes Relative: 5 % (ref 3–12)
Neutro Abs: 5.1 10*3/uL (ref 1.7–7.7)
Neutro Abs: 6 10*3/uL (ref 1.7–7.7)
Neutrophils Relative %: 64 % (ref 43–77)

## 2010-06-21 LAB — POCT CARDIAC MARKERS: CKMB, poc: <1 ng/mL — ABNORMAL LOW (ref 1.0–8.0)

## 2010-06-25 LAB — URINALYSIS, ROUTINE W REFLEX MICROSCOPIC
Nitrite: NEGATIVE
Specific Gravity, Urine: >1.03 — ABNORMAL HIGH (ref 1.005–1.030)
Urobilinogen, UA: 0.2 mg/dL (ref 0.0–1.0)
pH: 5 (ref 5.0–8.0)

## 2010-06-25 LAB — URINE MICROSCOPIC-ADD ON

## 2010-07-12 LAB — URINALYSIS, ROUTINE W REFLEX MICROSCOPIC
Bilirubin Urine: NEGATIVE
Glucose, UA: NEGATIVE mg/dL
Hgb urine dipstick: NEGATIVE
Ketones, ur: NEGATIVE mg/dL
Nitrite: NEGATIVE
Protein, ur: NEGATIVE mg/dL
Specific Gravity, Urine: >1.03 — ABNORMAL HIGH (ref 1.005–1.030)
Urobilinogen, UA: 0.2 mg/dL (ref 0.0–1.0)
pH: 5 (ref 5.0–8.0)

## 2010-07-12 LAB — URINE CULTURE: Colony Count: >=100000

## 2010-07-18 LAB — GLUCOSE, CAPILLARY
Glucose-Capillary: 122 mg/dL — ABNORMAL HIGH (ref 70–99)
Glucose-Capillary: 142 mg/dL — ABNORMAL HIGH (ref 70–99)
Glucose-Capillary: 244 mg/dL — ABNORMAL HIGH (ref 70–99)
Glucose-Capillary: 247 mg/dL — ABNORMAL HIGH (ref 70–99)
Glucose-Capillary: 265 mg/dL — ABNORMAL HIGH (ref 70–99)
Glucose-Capillary: 305 mg/dL — ABNORMAL HIGH (ref 70–99)
Glucose-Capillary: 327 mg/dL — ABNORMAL HIGH (ref 70–99)

## 2010-07-18 LAB — COMPREHENSIVE METABOLIC PANEL
ALT: 29 U/L (ref 0–35)
AST: 23 U/L (ref 0–37)
Albumin: 3.5 g/dL (ref 3.5–5.2)
Alkaline Phosphatase: 54 U/L (ref 39–117)
BUN: 8 mg/dL (ref 6–23)
CO2: 30 mEq/L (ref 19–32)
Calcium: 8.9 mg/dL (ref 8.4–10.5)
Chloride: 99 mEq/L (ref 96–112)
Creatinine, Ser: 0.51 mg/dL (ref 0.4–1.2)
GFR calc Af Amer: >60 mL/min (ref >60)
GFR calc non Af Amer: >60 mL/min (ref >60)
Glucose, Bld: 261 mg/dL — ABNORMAL HIGH (ref 70–99)
Potassium: 3.8 mEq/L (ref 3.5–5.1)
Sodium: 135 mEq/L (ref 135–145)
Total Bilirubin: 0.1 mg/dL — ABNORMAL LOW (ref 0.3–1.2)
Total Protein: 6.1 g/dL (ref 6.0–8.3)

## 2010-07-18 LAB — BASIC METABOLIC PANEL
BUN: 11 mg/dL (ref 6–23)
BUN: 12 mg/dL (ref 6–23)
CO2: 24 mEq/L (ref 19–32)
CO2: 30 mEq/L (ref 19–32)
Calcium: 9.4 mg/dL (ref 8.4–10.5)
Calcium: 9.9 mg/dL (ref 8.4–10.5)
Chloride: 97 mEq/L (ref 96–112)
Chloride: 98 mEq/L (ref 96–112)
Creatinine, Ser: 0.59 mg/dL (ref 0.4–1.2)
Creatinine, Ser: 0.64 mg/dL (ref 0.4–1.2)
GFR calc Af Amer: >60 mL/min (ref >60)
GFR calc Af Amer: >60 mL/min (ref >60)
GFR calc non Af Amer: >60 mL/min (ref >60)
GFR calc non Af Amer: >60 mL/min (ref >60)
Glucose, Bld: 238 mg/dL — ABNORMAL HIGH (ref 70–99)
Glucose, Bld: 293 mg/dL — ABNORMAL HIGH (ref 70–99)
Potassium: 3.4 mEq/L — ABNORMAL LOW (ref 3.5–5.1)
Potassium: 3.6 mEq/L (ref 3.5–5.1)
Sodium: 132 mEq/L — ABNORMAL LOW (ref 135–145)
Sodium: 135 mEq/L (ref 135–145)

## 2010-07-18 LAB — CARDIAC PANEL(CRET KIN+CKTOT+MB+TROPI)
CK, MB: 0.8 ng/mL (ref 0.3–4.0)
CK, MB: 0.9 ng/mL (ref 0.3–4.0)
CK, MB: 0.9 ng/mL (ref 0.3–4.0)
Relative Index: INVALID (ref 0.0–2.5)
Relative Index: INVALID (ref 0.0–2.5)
Relative Index: INVALID (ref 0.0–2.5)
Total CK: 58 U/L (ref 7–177)
Total CK: 66 U/L (ref 7–177)
Total CK: 74 U/L (ref 7–177)
Troponin I: 0.01 ng/mL (ref 0.00–0.06)
Troponin I: 0.01 ng/mL (ref 0.00–0.06)
Troponin I: 0.03 ng/mL (ref 0.00–0.06)

## 2010-07-18 LAB — DIFFERENTIAL
Basophils Absolute: 0 10*3/uL (ref 0.0–0.1)
Basophils Absolute: 0 10*3/uL (ref 0.0–0.1)
Basophils Absolute: 0 10*3/uL (ref 0.0–0.1)
Basophils Relative: 0 % (ref 0–1)
Basophils Relative: 1 % (ref 0–1)
Basophils Relative: 1 % (ref 0–1)
Eosinophils Absolute: 0.1 10*3/uL (ref 0.0–0.7)
Eosinophils Absolute: 0.1 10*3/uL (ref 0.0–0.7)
Eosinophils Absolute: 0.1 10*3/uL (ref 0.0–0.7)
Eosinophils Relative: 2 % (ref 0–5)
Eosinophils Relative: 2 % (ref 0–5)
Eosinophils Relative: 2 % (ref 0–5)
Lymphocytes Relative: 30 % (ref 12–46)
Lymphocytes Relative: 38 % (ref 12–46)
Lymphocytes Relative: 39 % (ref 12–46)
Lymphs Abs: 2.3 10*3/uL (ref 0.7–4.0)
Lymphs Abs: 2.3 10*3/uL (ref 0.7–4.0)
Lymphs Abs: 2.8 10*3/uL (ref 0.7–4.0)
Monocytes Absolute: 0.3 10*3/uL (ref 0.1–1.0)
Monocytes Absolute: 0.4 10*3/uL (ref 0.1–1.0)
Monocytes Absolute: 0.5 10*3/uL (ref 0.1–1.0)
Monocytes Relative: 5 % (ref 3–12)
Monocytes Relative: 6 % (ref 3–12)
Monocytes Relative: 7 % (ref 3–12)
Neutro Abs: 3.1 10*3/uL (ref 1.7–7.7)
Neutro Abs: 3.9 10*3/uL (ref 1.7–7.7)
Neutro Abs: 4.7 10*3/uL (ref 1.7–7.7)
Neutrophils Relative %: 52 % (ref 43–77)
Neutrophils Relative %: 54 % (ref 43–77)
Neutrophils Relative %: 62 % (ref 43–77)

## 2010-07-18 LAB — BLOOD GAS, ARTERIAL
Acid-Base Excess: 1 mmol/L (ref 0.0–2.0)
Bicarbonate: 25.4 mEq/L — ABNORMAL HIGH (ref 20.0–24.0)
FIO2: 0.21 %
O2 Saturation: 91.1 %
Patient temperature: 98.6
pCO2 arterial: 43.1 mmHg (ref 35.0–45.0)
pH, Arterial: 7.388 (ref 7.350–7.400)
pO2, Arterial: 63 mmHg — ABNORMAL LOW (ref 80.0–100.0)

## 2010-07-18 LAB — POCT CARDIAC MARKERS
CKMB, poc: 1.1 ng/mL (ref 1.0–8.0)
CKMB, poc: <1 ng/mL — ABNORMAL LOW (ref 1.0–8.0)
Myoglobin, poc: 18.3 ng/mL (ref 12–200)
Myoglobin, poc: 55.2 ng/mL (ref 12–200)
Troponin i, poc: <0.05 ng/mL (ref 0.00–0.09)
Troponin i, poc: <0.05 ng/mL (ref 0.00–0.09)

## 2010-07-18 LAB — CBC
HCT: 36.1 % (ref 36.0–46.0)
HCT: 39.3 % (ref 36.0–46.0)
HCT: 41.6 % (ref 36.0–46.0)
Hemoglobin: 13.4 g/dL (ref 12.0–15.0)
Hemoglobin: 14.1 g/dL (ref 12.0–15.0)
Hemoglobin: 15 g/dL (ref 12.0–15.0)
MCHC: 35.8 g/dL (ref 30.0–36.0)
MCHC: 36.1 g/dL — ABNORMAL HIGH (ref 30.0–36.0)
MCHC: 37.2 g/dL — ABNORMAL HIGH (ref 30.0–36.0)
MCV: 81 fL (ref 78.0–100.0)
MCV: 81.2 fL (ref 78.0–100.0)
MCV: 82.5 fL (ref 78.0–100.0)
Platelets: 248 10*3/uL (ref 150–400)
Platelets: 253 10*3/uL (ref 150–400)
Platelets: 307 10*3/uL (ref 150–400)
RBC: 4.44 MIL/uL (ref 3.87–5.11)
RBC: 4.77 MIL/uL (ref 3.87–5.11)
RBC: 5.14 MIL/uL — ABNORMAL HIGH (ref 3.87–5.11)
RDW: 14.9 % (ref 11.5–15.5)
RDW: 15.1 % (ref 11.5–15.5)
RDW: 15.2 % (ref 11.5–15.5)
WBC: 5.9 10*3/uL (ref 4.0–10.5)
WBC: 7.3 10*3/uL (ref 4.0–10.5)
WBC: 7.6 10*3/uL (ref 4.0–10.5)

## 2010-07-18 LAB — PROTIME-INR
INR: 1 (ref 0.00–1.49)
Prothrombin Time: 13.3 seconds (ref 11.6–15.2)

## 2010-07-18 LAB — MAGNESIUM: Magnesium: 1.6 mg/dL (ref 1.5–2.5)

## 2010-07-18 LAB — PHOSPHORUS: Phosphorus: 4.8 mg/dL — ABNORMAL HIGH (ref 2.3–4.6)

## 2010-07-18 LAB — D-DIMER, QUANTITATIVE: D-Dimer, Quant: 0.44 ug/mL-FEU (ref 0.00–0.48)

## 2010-07-18 LAB — TSH: TSH: 1.072 u[IU]/mL (ref 0.350–4.500)

## 2010-07-18 LAB — APTT: aPTT: 30 seconds (ref 24–37)

## 2010-07-18 LAB — BRAIN NATRIURETIC PEPTIDE: Pro B Natriuretic peptide (BNP): <30 pg/mL (ref 0.0–100.0)

## 2010-08-22 NOTE — H&P (Signed)
Ann Lopez, Ann Lopez               ACCOUNT NO.:  [id number]   MEDICAL RECORD NO.:  [id number]         PATIENT TYPE:  PINP   LOCATION:  A323                          FACILITY:  [facility]   PHYSICIAN:  Dorris Singh, DO    DATE OF BIRTH:  08-Dec-1970   DATE OF ADMISSION:  09/03/2008  DATE OF DISCHARGE:  LH                              HISTORY & PHYSICAL   CHIEF COMPLAINT:  Shortness of breath.   The patient is a 40 year old Caucasian female who presented to the [facility] Emergency Room with the chief complaint of shortness of breath.  She said over the last week or so she has noticed a progression in left-  sided arm pain and right jaw pain as well shortness of breath with  exertion.  She says that it has been getting worse and is usually  aggravated by certain positions and at times she feels like her heart is  racing.  She also states that this has been concerning because it has  been increasing in intensity.   Her past medical history is significant for:  1. Diabetes.  2. Breast cancer.  3. Hypercholesterolemia.  4. Hypertension.   She has a family history of cancer.  She has had a hysterectomy and a  mastectomy.  Is nonsmoker, nondrinker, no drug abuse.   Her current medications:  She has allergies to MACRODANTIN, PENICILLINS,  SULFA, and ZITHROMAX.   She is on:  1. Cymbalta 60 mg p.o. once a day.  2. Nexium 40 mg p.o. once a day.  3. NovoLog and Lantus, no dose given.  4. Actos.   REVIEW OF SYSTEMS:  CONSTITUTIONALLY:  Positive for fatigue.  EYES:  Negative for changes in vision.  HEENT:  Negative.  CHEST:  Positive for  chest pain.  RESPIRATORY:  Positive for shortness of breath.  GASTROINTESTINAL:  Negative.  GENITOURINARY:  Negative.  MUSCULOSKELETAL:  Negative.  NEUROLOGICAL:  Negative.  PSYCHIATRIC:  Negative.   PHYSICAL EXAMINATION:  Her vitals are as follows:  Temperature 98.0,  heart rate 89, blood pressure 121/51, respirations 20.  GENERAL:  The patient is an  obese 40 year old Caucasian female who looks  her stated age.  She is well-developed, well-nourished.  HEENT:  Head is normocephalic, atraumatic.  Eyes are PERRL.  EOMI.  Ears:  TMs visualized bilaterally.  Nose:  Turbinates are moist.  Mouth:  Positive geographic tongue.  No erythema or exudate noted.  NECK:  Is supple.  No lymphadenopathy.  HEART:  Is regular rate and rhythm.  No gallops, rubs or murmurs.  CHEST:  Clear to auscultation bilaterally.  No wheezes, rales or  rhonchi.  ABDOMEN:  Soft, nontender.  EXTREMITIES:  Positive pendulous abdomen.  EXTREMITIES:  Positive pulses.  No edema, ecchymosis or cyanosis.   Her current labs are as follows:  White count 7.6, hemoglobin 15.0,  hematocrit 41.6, platelet count 300,000.  Sodium 132, potassium 3.6,  chloride 98, carbon dioxide 24, glucose 238, BUN 12 and creatinine 0.64.  Troponins:  First set is negative.  Her D-dimer is 0.44.  Her second set  is negative.  ASSESSMENT AND PLAN:  1. Atypical chest pain.  2. Hyponatremia.  3. Diabetes.  4. Hypertension.  5. Hypercholesterolemia.   PLAN:  Will be to admit the patient to service of [facility].  Will place her on 3A with telemetry.  We will also keep  her saturations above 90 using Venti non-rebreather BiPAP.  We will  place her on a heart healthy diabetic diet, and we will hydrate her  cautiously.  Will also do DVT and GI prophylaxis, and we will place her  on her home medications and will give her nebulizer treatments.  She  will need to be reviewed tomorrow to see if she is still having the same  chest pain, and they can make a decision as she needs to be seen at  [facility] or if she can be discharged to home with outpatient follow-  up.      Dorris Singh, DO  Electronically Signed     CB/MEDQ  D:  09/03/2008  T:  09/03/2008  Job:  [id number]

## 2010-08-25 NOTE — H&P (Signed)
Ann Lopez, Ann Lopez               ACCOUNT NO.:  [id number]   MEDICAL RECORD NO.:  [id number]          PATIENT TYPE:  AMB   LOCATION:                                FACILITY:  [facility]   PHYSICIAN:  Tilda Burrow, M.D. DATE OF BIRTH:  [birth year]   DATE OF ADMISSION:  08/14/2004  DATE OF DISCHARGE:  LH                                HISTORY & PHYSICAL   ADMISSION DIAGNOSES:  1.  Heavy menses.  2.  BRCA1 carrier.  Advised to consider hysterectomy and bilateral salpingo-      oophorectomy by [facility]  3.  Obesity with panniculus.   Scheduled for TAH, BSO and tummy tuck on Aug 14, 2004.   HISTORY OF PRESENT ILLNESS:  This 40 year old female, gravida 4, para 4,  status post two vaginal deliveries and two cesarean sections, is admitted at  this time for abdominal hysterectomy and bilateral salpingo-oophorectomy.  She is a known BRCA1 carrier with family history of breast cancer.  She is  followed by Dr. Erskine Speed of [facility].  Recent  consultation there concludes that she would be better off to have a  hysterectomy with removal of ovaries with add back hormonal therapy in order  to reduce her chances of breast cancer.  She wants a tummy tuck with her  hysterectomy and bilateral salpingo-oophorectomy.  The pros and cons of the  surgery have been discussed and the patient is scheduled for a hysterectomy  at this time on Aug 14, 2004.  Bowel prep is planned with DVT prophylaxis  with Flowtrons planned.  The patient is made specifically aware of the risks  of the procedure, including blood clots, bleeding and infection.  The  patient is aware that she has the option of surgery at [facility] and is  preferentially desiring to have her surgery here.   PAST MEDICAL HISTORY:  Essentially otherwise uneventful.  She has a history  of recurrent urinary tract infections.  She is a smoker.  She has a history  of HSV2 not currently requiring suppression.  She also has  been noted to  hypertriglyceridemia and is not currently on therapy.  She is seeing Dr.  Hal Hope in [place], [place], regarding this.   OBSTETRICAL HISTORY:  Notable for vaginal delivery x 2 and two cesarean  deliveries initially performed in 1999 for breech with baby #3.  She has had  a prior cesarean section in May of 2005 with generous wide excision of the  cicatrix at the time.  She desires this process to be continued.   FAMILY HISTORY:  Positive for breast cancer in the patient's mother and aunt  at age 1, as well as the aunt's daughter at age 38.   PHYSICAL EXAMINATION:  GENERAL APPEARANCE:  A large, stocky-framed,  Caucasian female.  HEIGHT:  5 feet 2 inches.  WEIGHT:  229 pounds.  VITAL SIGNS:  Blood pressure 120/82.  HEENT:  Pupils equal, round and reactive.  Extraocular movements intact.  CHEST:  Clear to auscultation.  ABDOMEN:  Moderate obesity.  Well-healed surgical C-section scar.  PELVIC:  Normal female anatomy.  Recent Pap class I.  Uterus within normal  limits and size.  EXTREMITIES:  Unremarkable.   LABORATORY DATA:  Hemoglobin 13.0.  Urinalysis normal.   ASSESSMENT:  1.  Menorrhagia.  2.  BRCA1 carrier status.   PLAN:  TAH and BSO on Aug 14, 2004.   [facility] CASE NUMBER:  [id number].      JVF/MEDQ  D:  08/09/2004  T:  08/09/2004  Job:  [id number]   cc:   Evorn Gong, M.D.  [facility]  Breast Oncology Clinic

## 2010-08-25 NOTE — H&P (Signed)
NAME:  Ann Lopez, Ann Lopez                         ACCOUNT NO.:  [id number]   MEDICAL RECORD NO.:  [id number]                   PATIENT TYPE:  AMB   LOCATION:  DAY                                  FACILITY:  [facility]   PHYSICIAN:  Tilda Burrow, M.D.              DATE OF BIRTH:  05-05-1970   DATE OF ADMISSION:  DATE OF DISCHARGE:                                HISTORY & PHYSICAL   ADMISSION DIAGNOSES:  1. Pregnancy at 38-1/[redacted] weeks gestation.  2. Prior cesarean section.  3. Now for trial of labor and elective permanent sterilization.   HISTORY OF PRESENT ILLNESS:  This 40 year old female, gravida 4, para 3, AB  0, LMP unknown with ultrasound- EDC of Aug 22, 2003, based on seven-week  ultrasound with ultrasound December 24, 2002, suggesting Aug 17, 2003.  She  is admitted at this time for repeat cesarean section and bilateral tubal  ligation.  The patient is now scheduled for repeat C section at 38-1/[redacted] weeks  gestation.  Most recently, she has been extremely uncomfortable due to upper  limits normal amniotic fluid volume.  She confirms desire for permanent  sterilization.   PAST MEDICAL HISTORY:  Significant that patient is a _________ carrier,  previously advised to consider hysterectomy with removal of ovaries.  She  sees Dr. Kyra Leyland at [facility].  Also positive for  asthma and hypertriglyceridemia.   PAST SURGICAL HISTORY:  1. C section in 1999.  2. Arm fracture requiring pinning.   ALLERGIES:  PENICILLIN, SULFA, AZITHROMYCIN, MACROBID, all of which cause a  rash.   MEDICATIONS:  Advair inhaler.   SOCIAL HISTORY:  Stable relationship with father of this child.   FAMILY HISTORY:  Positive for both mother and grandmother with breast cancer  as well as a first cousin, all of which are followed at [facility].   PHYSICAL EXAMINATION:  VITAL SIGNS:  Height 5 feet, 2 inches, weight 243.  GENERAL:  Large framed, stocky, Caucasian female alert and oriented x 3.  HEENT:  Pupils equal, round, and reactive.  NECK:  Supple.  Trachea midline.  CARDIOVASCULAR:  Exam unremarkable.  ABDOMEN:  42 cm fundal height with a protuberant, rotund abdomen.  Amniotic  fluid suspected upper limits normal.  Adnexa nontender.  Well-healed lower  abdominal Pfannenstiel type scar.   PLAN:  Repeat cesarean section and tubal ligation Aug 09, 2003.   ADDENDUM:  Blood type AB positive, antibody screen negative, rubella immune.  Present hemoglobin 14, hematocrit 44.  Hepatitis, HIV, GC, Chlamydia, and  RPR are all negative.  MSAFP normal at 1 in 4900 MSAFP risk for Down's  syndrome.     ___________________________________________                                         Tilda Burrow, M.D.  JVF/MEDQ  D:  08/06/2003  T:  08/06/2003  Job:  [id number]   cc:   Francoise Schaumann. Halm, D.O.  [address]  [place]  [place] [address]  Fax: [phone]   Marcos Eke. Hal Hope, M.D.  [address] [place]  [place] [address]  Fax: [phone]

## 2010-08-25 NOTE — Discharge Summary (Signed)
NAME:  Ann Lopez, Ann Lopez                         ACCOUNT NO.:  [id number]   MEDICAL RECORD NO.:  [id number]                   PATIENT TYPE:  INP   LOCATION:  A403                                 FACILITY:  [facility]   PHYSICIAN:  Tilda Burrow, M.D.              DATE OF BIRTH:  02-Jul-1970   DATE OF ADMISSION:  08/09/2003  DATE OF DISCHARGE:  08/12/2003                                 DISCHARGE SUMMARY   ADMITTING DIAGNOSES:  1. Pregnancy 38-1/[redacted] weeks gestation, prior cesarean section, not for trial     of labor.  2. Desire for elective permanent sterilization.   DISCHARGE DIAGNOSES:  1. Pregnancy 38-1/[redacted] weeks gestation, prior cesarean section, not for trial     of labor.  2. Desire for elective permanent sterilization.  3. Asthma.   PROCEDURE:  1. Repeat low transverse cervical cesarean section.  2. Bilateral partial salpingectomy.  3. Partial panniculectomy.   DISCHARGE MEDICATIONS:  1. Tylox 1-2 p.o. q.4h. p.r.n. pain, dispensed 2.  2. Advair Diskus 100/50 used b.i.d.  3. Albuterol handheld nebulizer p.r.n.  4. Prenatal vitamins 1 p.o. daily x30 days.   HOSPITAL SUMMARY:  This 40 year old female, gravida 4, para 3, AB 0 had 2  prior vaginal deliveries with subsequent C-sections; admitted for repeat C-  section and tubal ligation.   MEDICAL HISTORY:  Medical history was notable that she is a BRCA-1 carrier  and previously advised to consider hysterectomy and oophorectomy.  She is  followed by Dr. Kyra Leyland at [facility].  Medical history  also positive for asthma and hypertriglyceridemia.   SURGICAL HISTORY:  C-section 1999, __________ fracture.   ALLERGIES:  PENICILLIN, SULFA, AZITHROMYCIN, and MACROBID.   MEDICATIONS:  Advair inhaler, albuterol inhaler.   PHYSICAL EXAMINATION:  VITAL SIGNS:  Height 5 feet 2 inches.  Weight 243.  ABDOMEN:  42 cm fundal height, lax abdomen, protuberant, lax abdominal skin.   HOSPITAL COURSE:  The patient was admitted,  underwent C-section, tubal  ligation, and partial panniculectomy on day of admission, 08/09/2003.  Two  Jackson-Pratt drains were left in place.  Postoperatively she had a straight  forward course. She had a postoperative hemoglobin of 11.3, hematocrit 32.7  compared to admission hemoglobin 12.9 and hematocrit 37.3.  Arterial blood  gas, on the infant, pH 7.330, pCO2 47, pO2 11; on the arterial sample; on  cord blood.  The patient then had more pain than usual due to the abdominal  laxity.  We kept her a third day, therefore.  She was afebrile with a  tolerance of regular diet, adequate pain relief on oral medications and was  stable for discharge on 08/12/2003.  Follow up in 1 week for staple removal  and Jackson-Pratt drain removed.     ___________________________________________  Tilda Burrow, M.D.   JVF/MEDQ  D:  08/12/2003  T:  08/12/2003  Job:  [id number]

## 2010-08-25 NOTE — Op Note (Signed)
Ann Lopez, Ann Lopez               ACCOUNT NO.:  [id number]   MEDICAL RECORD NO.:  [id number]          PATIENT TYPE:  INP   LOCATION:  A417                          FACILITY:  [facility]   PHYSICIAN:  Tilda Burrow, M.D. DATE OF BIRTH:  02-Mar-1971   DATE OF PROCEDURE:  08/14/2004  DATE OF DISCHARGE:                                 OPERATIVE REPORT   PREOPERATIVE DIAGNOSIS:  Menorrhagia, BRCA1 carrier status, obesity with  panniculus.   POSTOPERATIVE DIAGNOSIS:  Menorrhagia, BRCA1 carrier status, obesity with  panniculus.   OPERATION PERFORMED:  Total abdominal hysterectomy, bilateral salpingo-  oophorectomy, panniculectomy.   SURGEON:  Tilda Burrow, M.D.   ASSISTANT:  Asencion Noble  _, CST-FA, [place], [place].   ANESTHESIA:  Kathrynn Speed, CRNA.   COMPLICATIONS:  None.   FINDINGS:  Mobile uterus with evidence of prior cesarean section, scarring  anteriorly, easily removed.  Obesity with panniculus.   DESCRIPTION OF PROCEDURE:  The patient was marked in the preop area,  confirmed as having no changes in status or patient request.  She was taken  to the operating room, prepped and draped with Foley catheter inserted,  vaginal prepping having been performed.  The abdominal fatty tissues to be  removed had been marked in the preop area.  We then started on the patient's  left side and removed a wedge of tissue consisting of lower panniculus roll,  removing approximately 15 cm wide x 30 cm long ellipse of skin and fatty  tissue on the left side.  This was taken around to the posterior superior  iliac crest, removed leaving a thin layer of fatty tissue above the fascia  to improve lymphatic flow later.  Hemostasis was achieved usually through  Bovie cautery but occasional ligature of identifiable large vessels or  anything arterial.  These were tied with 3-0 Dexon ties.  The area was  irrigated, reapproximated with interrupted 2-0 plain and the lateral two  thirds of this portion  closed with staples before proceeding to the opposite  side where a similar procedure was performed.  The middle portion was then  addressed by removing a less wide area of skin and fatty tissue from just  above the mons pubis to halfway to the umbilicus.  This completed the  panniculectomy and allowed access to the midline where a Pelosi type  incision was performed with careful entry of the peritoneal cavity using  elevation of the abdominal wall and careful  layer by layer opening using  sharp dissection.  There was no suspicious of intraperitoneal injury though  the bowel was right up against the peritoneum.  We then proceeded with  opening the peritoneum sagittally as well and elevated the bowel away from  the pelvis, packed it away with two moistened lap tapes and a moistened  green towel being careful to minimize the intra-abdominal pressure.  The  Balfour retractor was placed in position.  Bladder flap positioned and  attention to the uterus performed.  A Lahey thyroid tenaculum was placed in  the uterine fundus, the round ligament was doubly ligated and transected,  the  infundibulopelvic ligaments bilaterally isolated.  The ureters palpated  in the retroperitoneal space and confirmed as being out of harm's way, then  the infundibulopelvic ligament was clamped, cut and suture ligated. We then  proceeded with skeletonizing the broad ligament avascular spaces and  skeletonized the uterine vessels on either side of the uterus.  Bladder flap  was then developed anteriorly, sharply dissecting off the fibrosis from the  two cesarean sections.  We then proceeded with pushing the bladder flap  inferiorly with a nice clean lower uterine segment identified.  The upper  cardinal ligaments were clamped, cut and suture ligated incorporating the  uterine vessels in this pedicle and single ligature provided good  hemostasis.  The upper and lower cardinal ligaments were then clamped, cut  and  suture ligated bilateral and then lower cardinal ligaments treated  similarly.  This allowed access to the cuff where a staff incision in the  anterior cervical vaginal fornix allowed amputation of the cuff off of the  cervix.  The cuff was grasped with four Kocher clamps.  There was lots of  pulsatile bleeding on the left side from vaginal wall bleeders.  Aldridge  stitch was placed at each lateral vaginal angle connecting the lower  cardinal ligaments to the cuff and this resulted in significantly improved  hemostasis.  A posterior incision was made in the posterior cul-de-sac  reducing the size of the cuff side-to-side.  Then the remainder of the cuff  was closed interrupted fashion from side-to-side from Aldridge stitch to  [employer].  This resulted in good pelvic support.  Consideration was  given to placing a permanent suture pulling the uterosacral ligaments more  to the midline but it was eventually decided that the support was adequate  as is.  This was removed.  We then proceeded with irrigation of the pelvis  and checked for hemostasis, placing an additional suture on the cuff on the  patient's left side as well as on the right in the lower cardinal ligament  area.  In no time did think that the ureter was in harm's way, at any time  during this process. The cuff was finally hemostatic and loosely  reperitonealized with 2-0 chromic x2 interrupted sutures.  The pelvis was  irrigated, the anterior peritoneum was closed with continuous running 0  Prolene and then the panniculectomy completed by reapproximating the fatty  tissues, placement of flat Jackson-Pratt drains beneath the panniculus  reapproximation and aligned these to exit just on either side of the mons  pubis of the inferior aspects of the incision through separate stab  incisions.  These were sutured in place.  Skin edges were reapproximated using staples after two layers of 2-0 plain suture in the subcu fatty  space.  The patient tolerated the procedure well and went to recovery room in good  condition.  Sponge and needle counts were correct.  Antibiotics received  preoperatively.      JVF/MEDQ  D:  08/14/2004  T:  08/14/2004  Job:  [id number]

## 2010-08-25 NOTE — Op Note (Signed)
NAME:  Ann Lopez, Ann Lopez                         ACCOUNT NO.:  [id number]   MEDICAL RECORD NO.:  [id number]                   PATIENT TYPE:  INP   LOCATION:  A403                                 FACILITY:  [facility]   PHYSICIAN:  Tilda Burrow, M.D.              DATE OF BIRTH:  October 21, 1970   DATE OF PROCEDURE:  DATE OF DISCHARGE:                                 OPERATIVE REPORT   PREOPERATIVE DIAGNOSES:  1. Pregnancy at 39 weeks.  2. Repeat cesarean section, not for trial of labor.  3. Elective sterilization.   POSTOPERATIVE DIAGNOSES:  1. Pregnancy at 39 weeks.  2. Repeat cesarean section, not for trial of labor.  3. Elective sterilization.  4. Delivered.   PROCEDURE:  Repeat low transverse cervical cesarean section, bilateral  partial salpingectomy.   SURGEON:  Tilda Burrow, M.D.   ASSISTANTAmie Critchley, C.S.T.   ANESTHESIA:  Spinal.   COMPLICATIONS:  None.   ESTIMATED BLOOD LOSS:  800 to 1000 cc.   FINDINGS:  Extremely lax abdominal wall with marked panniculus crease.  9  pound 13 ounce female infant with Apgars of 9 and 9.   FLUIDS:  2900 cc of crystalloid.   URINE OUTPUT:  300 cc.   SPECIMENS:  Portion of left and right tubes.   DISCARDED TISSUE:  30 cm x 10 cm ellipse of skin and fatty tissue.   DESCRIPTION OF PROCEDURE:  The patient was taken to the operating room and  prepped and draped in the usual sterile fashion for lower abdominal surgery.   The old transverse crease was noted and that she had a very marked crease  approximately 10 below that.  A transverse cut was made halfway between the  old scar and the lower abdominal crease and extending transversely  approximately 30 cm in length.  The fatty tissue was cut down to the fascia  which was opened transversely and the peritoneal cavity entered in the  midline without difficulty.  The bladder flap was developed on the lower  uterine segment and a transverse nick made in the uterus, the index finger  traction technique used to open the lower uterine segment, and then the  membranes ruptured bluntly.  The fetal vertex was guided in the incision.  We could not get the baby out without using vacuum extractor to guide it  while fundal pressure was applied as the primary propulsive force.  The  amniotic fluid was clear without malodor.  Bulb suction was performed of the  head and then the baby's body delivered.  The infant was a stocky infant  cared for by Dr. Milford Lopez whose notes are dictated elsewhere.   A very thick, greater than 2-cm wide cord of average length was noted.  The  cord blood samples were obtained.  The placenta delivered by __________  massage, Schultze presentation.  The membranes were intact.  Uterine  irrigation with Ancef solution  was followed by single-layer running locking  closure of the uterine cavity and then 2-0 chromic closure of the bladder  flap, with good hemostasis confirmed.   Tubal ligation was then performed by excising a midsegment knuckle of each  tube, doubly ligating around the knuckle of tube, excising the specimen, and  sending the specimen for histology.  The patient then had wide excision of  the old cicatrix.  We took out a 10-cm wide ellipse of skin and underlying  fatty tissue from above the previous incision, using Bovie cautery as needed  for hemostasis and cutting technique.  The lower aspect of the incision was  mobilized.  We first closed the fascia side-to-side with 0 Vicryl after  trimming a 2-cm portion of the fascia because of its marked excessive  laxity.  We then took out the fatty tissue.  We then revised the fatty  tissue in such a way as to result in a more smooth skin edge approximation.  This will reduce the tendency of moisture trapping beneath the panniculus.  After three layers of 2-0 plain were placed in the subcutaneous fatty tissue  and two flat JP drains placed in the depth of the subcutaneous fatty tissue,  we closed the  skin with staples.   The patient went to the recovery room in good condition, with an estimated  blood loss of 800 to 1000 cc.      ___________________________________________                                            Tilda Burrow, M.D.   JVF/MEDQ  D:  08/09/2003  T:  08/09/2003  Job:  [id number]   cc:   Ann Lopez. Ann Lopez, D.O.  [address]  [place]  [place] [address]  Fax: [phone]   attn Dr. Tanna Furry [facility]

## 2010-08-25 NOTE — Discharge Summary (Signed)
Ann Lopez, Ann Lopez               ACCOUNT NO.:  [id number]   MEDICAL RECORD NO.:  [id number]          PATIENT TYPE:  INP   LOCATION:  A417                          FACILITY:  [facility]   PHYSICIAN:  Tilda Burrow, M.D. DATE OF BIRTH:  1970-11-17   DATE OF ADMISSION:  08/14/2004  DATE OF DISCHARGE:  05/11/2006LH                                 DISCHARGE SUMMARY   ADMISSION DIAGNOSES:  1.  Heavy menses (menorrhagia).  2.  BRCA-1 carrier, advised to consider total abdominal hysterectomy and      bilateral salpingo-oophorectomy by [facility].  3.  Obesity with panniculus.   DISCHARGE DIAGNOSES:  1.  Heavy menses (menorrhagia).  2.  BRCA-1 carrier, advised to consider total abdominal hysterectomy and      bilateral salpingo-oophorectomy by [facility].  3.  Obesity with panniculus.  4.  Possible Pseudomonas of the skin without evidence of wound infection.   PROCEDURE:  Total abdominal hysterectomy, bilateral salpingo-oophorectomy,  panniculectomy on Aug 14, 2004, Dr. Christin Bach.   DISCHARGE MEDICATIONS:  1.  Levaquin 500 mg p.o. daily x10 days.  2.  Tylox one q.4h. p.r.n. pain, dispense #40.  3.  Climara 0.1 mg patch p.o. every week.  4.  HCTZ 25 mg p.o. daily x30 days.  5.  Antera 130 mg p.o. daily for hypercholesterolemia.  6.  Zyban 150 mg p.o. daily.  7.  Singular 10 mg p.o. daily.   HISTORY OF PRESENT ILLNESS:  This 40 year old female, gravida 4, para 2,  presents with SVD x2, and cesarean section x2, admitted for total abdominal  hysterectomy and bilateral salpingo-oophorectomy as described in the  admitting history.   HOSPITAL COURSE:  The patient was admitted with height of 5 feet 2 inches,  weight of 229, with truncal obesity and pelvic examination showing uterus  within normal limits size, moderate pelvic laxity.  The patient underwent  TAH, BSO, and panniculectomy as described in the operative note with two  flat Jackson-Pratt drains  placed in the subcu space.  Postoperatively, the  first eight hours after there was generous serous output from the wound she  was being placed on antibiotics prophylaxis intraoperatively.  The following  day, hemoglobin was 11, hematocrit was 32, down only slightly from a  hemoglobin of 13, and a hematocrit of 38 on admission.  Then she had a  generalized fatigue on day #2, was kept an additional day.  She showered on  day #2, kept the dressing on, the following morning we had a sweet odor  suggestive of Pseudomonas in the dressing.  The wound itself looked quite  clean.  Culture of the dressing was performed.  Jackson-Pratt drains are  clearing very nicely and they are non-purulent without erythema.  The  patient is afebrile.  Her postoperative hemoglobin is 9.7, hematocrit 28.3  on postoperative day #3.  White count is 8800.  The patient tolerated a  regular diet, was passing gas, and was ready for discharge home, and will be  followed up in six days for staple removal.      JVF/MEDQ  D:  08/17/2004  T:  08/17/2004  Job:  [id number]

## 2010-09-26 ENCOUNTER — Emergency Department (HOSPITAL_COMMUNITY): Payer: Medicare Other

## 2010-09-26 ENCOUNTER — Emergency Department (HOSPITAL_COMMUNITY)
Admission: EM | Admit: 2010-09-26 | Discharge: 2010-09-26 | Disposition: A | Discharge status: Home / Self Care | Payer: Medicare Other | Attending: Emergency Medicine | Admitting: Emergency Medicine

## 2010-09-26 ENCOUNTER — Encounter (HOSPITAL_COMMUNITY): Payer: Self-pay | Admitting: Radiology

## 2010-09-26 DIAGNOSIS — E7800 Pure hypercholesterolemia, unspecified: Secondary | ICD-10-CM | POA: Insufficient documentation

## 2010-09-26 DIAGNOSIS — F411 Generalized anxiety disorder: Secondary | ICD-10-CM | POA: Insufficient documentation

## 2010-09-26 DIAGNOSIS — Z853 Personal history of malignant neoplasm of breast: Secondary | ICD-10-CM | POA: Insufficient documentation

## 2010-09-26 DIAGNOSIS — Z794 Long term (current) use of insulin: Secondary | ICD-10-CM | POA: Insufficient documentation

## 2010-09-26 DIAGNOSIS — F3289 Other specified depressive episodes: Secondary | ICD-10-CM | POA: Insufficient documentation

## 2010-09-26 DIAGNOSIS — Z79899 Other long term (current) drug therapy: Secondary | ICD-10-CM | POA: Insufficient documentation

## 2010-09-26 DIAGNOSIS — R071 Chest pain on breathing: Secondary | ICD-10-CM | POA: Insufficient documentation

## 2010-09-26 DIAGNOSIS — E119 Type 2 diabetes mellitus without complications: Secondary | ICD-10-CM | POA: Insufficient documentation

## 2010-09-26 DIAGNOSIS — F329 Major depressive disorder, single episode, unspecified: Secondary | ICD-10-CM | POA: Insufficient documentation

## 2010-09-26 HISTORY — DX: Malignant (primary) neoplasm, unspecified: C80.1

## 2010-10-01 ENCOUNTER — Emergency Department (HOSPITAL_COMMUNITY): Payer: Medicare Other

## 2010-10-01 ENCOUNTER — Emergency Department (HOSPITAL_COMMUNITY)
Admission: EM | Admit: 2010-10-01 | Discharge: 2010-10-02 | Disposition: A | Discharge status: Home / Self Care | Payer: Medicare Other | Attending: Emergency Medicine | Admitting: Emergency Medicine

## 2010-10-01 DIAGNOSIS — E7800 Pure hypercholesterolemia, unspecified: Secondary | ICD-10-CM | POA: Insufficient documentation

## 2010-10-01 DIAGNOSIS — Z79899 Other long term (current) drug therapy: Secondary | ICD-10-CM | POA: Insufficient documentation

## 2010-10-01 DIAGNOSIS — M7989 Other specified soft tissue disorders: Secondary | ICD-10-CM | POA: Insufficient documentation

## 2010-10-01 DIAGNOSIS — F410 Panic disorder [episodic paroxysmal anxiety] without agoraphobia: Secondary | ICD-10-CM | POA: Insufficient documentation

## 2010-10-01 DIAGNOSIS — R079 Chest pain, unspecified: Secondary | ICD-10-CM | POA: Insufficient documentation

## 2010-10-01 DIAGNOSIS — F3289 Other specified depressive episodes: Secondary | ICD-10-CM | POA: Insufficient documentation

## 2010-10-01 DIAGNOSIS — F329 Major depressive disorder, single episode, unspecified: Secondary | ICD-10-CM | POA: Insufficient documentation

## 2010-10-01 DIAGNOSIS — F411 Generalized anxiety disorder: Secondary | ICD-10-CM | POA: Insufficient documentation

## 2010-10-01 DIAGNOSIS — E119 Type 2 diabetes mellitus without complications: Secondary | ICD-10-CM | POA: Insufficient documentation

## 2010-10-02 ENCOUNTER — Emergency Department (HOSPITAL_COMMUNITY): Payer: Medicare Other

## 2010-10-02 LAB — BASIC METABOLIC PANEL
BUN: 14 mg/dL (ref 6–23)
Calcium: 10.4 mg/dL (ref 8.4–10.5)
Creatinine, Ser: 0.75 mg/dL (ref 0.50–1.10)
GFR calc Af Amer: >60 mL/min (ref >60)
GFR calc non Af Amer: >60 mL/min (ref >60)
Glucose, Bld: 120 mg/dL — ABNORMAL HIGH (ref 70–99)
Potassium: 4 mEq/L (ref 3.5–5.1)

## 2010-10-02 LAB — CBC
HCT: 36.8 % (ref 36.0–46.0)
Hemoglobin: 12.2 g/dL (ref 12.0–15.0)
MCH: 27.5 pg (ref 26.0–34.0)
MCHC: 33.2 g/dL (ref 30.0–36.0)
MCV: 83.1 fL (ref 78.0–100.0)
RDW: 14.6 % (ref 11.5–15.5)

## 2010-10-02 LAB — DIFFERENTIAL
Basophils Absolute: 0 10*3/uL (ref 0.0–0.1)
Eosinophils Relative: 3 % (ref 0–5)
Lymphocytes Relative: 31 % (ref 12–46)
Monocytes Absolute: 0.6 10*3/uL (ref 0.1–1.0)
Monocytes Relative: 6 % (ref 3–12)

## 2010-10-02 MED ORDER — IOHEXOL 350 MG/ML SOLN
100.0000 mL | Freq: Once | INTRAVENOUS | Status: AC | PRN
  Administered 2010-10-02: 100 mL via INTRAVENOUS

## 2010-11-25 ENCOUNTER — Encounter (HOSPITAL_COMMUNITY): Payer: Self-pay | Admitting: Emergency Medicine

## 2010-11-25 ENCOUNTER — Emergency Department (HOSPITAL_COMMUNITY)
Admission: EM | Admit: 2010-11-25 | Discharge: 2010-11-26 | Disposition: A | Discharge status: Home / Self Care | Payer: Medicare Other | Attending: Emergency Medicine | Admitting: Emergency Medicine

## 2010-11-25 ENCOUNTER — Emergency Department (HOSPITAL_COMMUNITY): Payer: Medicare Other

## 2010-11-25 ENCOUNTER — Other Ambulatory Visit: Payer: Self-pay

## 2010-11-25 DIAGNOSIS — Z87891 Personal history of nicotine dependence: Secondary | ICD-10-CM | POA: Insufficient documentation

## 2010-11-25 DIAGNOSIS — Z859 Personal history of malignant neoplasm, unspecified: Secondary | ICD-10-CM | POA: Insufficient documentation

## 2010-11-25 DIAGNOSIS — E119 Type 2 diabetes mellitus without complications: Secondary | ICD-10-CM | POA: Insufficient documentation

## 2010-11-25 DIAGNOSIS — R079 Chest pain, unspecified: Secondary | ICD-10-CM | POA: Insufficient documentation

## 2010-11-25 LAB — CBC
MCH: 27.3 pg (ref 26.0–34.0)
MCHC: 32.3 g/dL (ref 30.0–36.0)
MCV: 84.7 fL (ref 78.0–100.0)
Platelets: 247 10*3/uL (ref 150–400)
RDW: 15.2 % (ref 11.5–15.5)

## 2010-11-25 LAB — BASIC METABOLIC PANEL
Calcium: 9.1 mg/dL (ref 8.4–10.5)
Creatinine, Ser: 0.72 mg/dL (ref 0.50–1.10)
GFR calc non Af Amer: >60 mL/min (ref >60)
Sodium: 139 mEq/L (ref 135–145)

## 2010-11-25 NOTE — ED Provider Notes (Signed)
History     CSN: [id number] Arrival date & time: 11/25/2010 10:52 PM  Chief Complaint  Patient presents with  . Chest Pain   HPI  Past Medical History  Diagnosis Date  . Diabetes mellitus   . Cancer     History reviewed. No pertinent past surgical history.  History reviewed. No pertinent family history.  History  Substance Use Topics  . Smoking status: Former Games developer  . Smokeless tobacco: Not on file  . Alcohol Use: No    OB History    Grav Para Term Preterm Abortions TAB SAB Ect Mult Living                  Review of Systems  Physical Exam  Pulse 75  Temp(Src) 97.8 F (36.6 C) (Oral)  Resp 17  Ht 5\' 2"  (1.575 m)  Wt 250 lb (113.399 kg)  BMI 45.73 kg/m2  SpO2 98%  Physical Exam  ED Course  Procedures  MDM  Date: 11/25/2010 2248  Rate: 70 Rhythm: normal sinus rhythm  QRS Axis: normal  Intervals: normal  ST/T Wave abnormalities: normal  Conduction Disutrbances:none  Narrative Interpretation:   Old EKG Reviewed: unchanged Results for orders placed during the hospital encounter of 11/25/10  GLUCOSE, CAPILLARY      Component Value Range   Glucose-Capillary 161 (*) 70 - 99 (mg/dL)  CBC      Component Value Range   WBC 7.6  4.0 - 10.5 (K/uL)   RBC 4.50  3.87 - 5.11 (MIL/uL)   Hemoglobin 12.3  12.0 - 15.0 (g/dL)   HCT 78.2  95.6 - 21.3 (%)   MCV 84.7  78.0 - 100.0 (fL)   MCH 27.3  26.0 - 34.0 (pg)   MCHC 32.3  30.0 - 36.0 (g/dL)   RDW 08.6  57.8 - 46.9 (%)   Platelets 247  150 - 400 (K/uL)  BASIC METABOLIC PANEL      Component Value Range   Sodium 139  135 - 145 (mEq/L)   Potassium 3.6  3.5 - 5.1 (mEq/L)   Chloride 104  96 - 112 (mEq/L)   CO2 28  19 - 32 (mEq/L)   Glucose, Bld 161 (*) 70 - 99 (mg/dL)   BUN 10  6 - 23 (mg/dL)   Creatinine, Ser 6.29  0.50 - 1.10 (mg/dL)   Calcium 9.1  8.4 - 52.8 (mg/dL)   GFR calc non Af Amer >60  >60 (mL/min)   GFR calc Af Amer >60  >60 (mL/min)  CARDIAC PANEL(CRET KIN+CKTOT+MB+TROPI)      Component Value  Range   Total CK 66  7 - 177 (U/L)   CK, MB 2.1  0.3 - 4.0 (ng/mL)   Troponin I <0.30  <0.30 (ng/mL)   Relative Index RELATIVE INDEX IS INVALID  0.0 - 2.5   DIFFERENTIAL      Component Value Range   Neutrophils Relative 49  43 - 77 (%)   Neutro Abs 3.6  1.7 - 7.7 (K/uL)   Lymphocytes Relative 42  12 - 46 (%)   Lymphs Abs 3.1  0.7 - 4.0 (K/uL)   Monocytes Relative 8  3 - 12 (%)   Monocytes Absolute 0.6  0.1 - 1.0 (K/uL)   Eosinophils Relative 1  0 - 5 (%)   Eosinophils Absolute 0.1  0.0 - 0.7 (K/uL)   Basophils Relative 1  0 - 1 (%)   Basophils Absolute 0.0  0.0 - 0.1 (K/uL)  Chest Portable 1 View  11/25/2010  *RADIOLOGY REPORT*  Clinical Data: Sudden onset chest pain and pressure  PORTABLE CHEST - 1 VIEW  Comparison: Portable exam 2307 hours compared to 10/01/2010  Findings: Lordotic positioning. Normal heart size, mediastinal contours, and pulmonary vascularity. Minimal chronic peribronchial thickening. No pulmonary infiltrate, pleural effusion or pneumothorax. No acute osseous findings.  IMPRESSION: Chronic bronchitic changes. Resolution of left basilar atelectasis since previous study. No acute abnormalities.  Original Report Authenticated By: Lollie Marrow, M.D.   Patient who presented with chest pain, negative cardiac markers, chest xray and EKG. Labs unremarkable. Relief obtained with analgesics. Reviewed all results with patient. MDM Reviewed: nursing note, vitals and previous chart Reviewed previous: labs, ECG and x-ray Interpretation: labs, x-ray and ECG      Aurther Loft S. Colon Branch, MD 11/26/10 9604

## 2010-11-25 NOTE — ED Notes (Signed)
Sudden onset of chest pressure centralized at rest pt took a xanex thinking it was anxiety and pressure increased

## 2010-11-26 LAB — DIFFERENTIAL
Basophils Absolute: 0 10*3/uL (ref 0.0–0.1)
Lymphocytes Relative: 42 % (ref 12–46)
Neutro Abs: 3.6 10*3/uL (ref 1.7–7.7)
Neutrophils Relative %: 49 % (ref 43–77)

## 2010-11-26 LAB — CARDIAC PANEL(CRET KIN+CKTOT+MB+TROPI)
CK, MB: 2.1 ng/mL (ref 0.3–4.0)
Total CK: 66 U/L (ref 7–177)

## 2010-11-26 MED ORDER — DIPHENHYDRAMINE HCL 50 MG/ML IJ SOLN
25.0000 mg | Freq: Once | INTRAMUSCULAR | Status: AC
  Administered 2010-11-26: 25 mg via INTRAVENOUS
  Filled 2010-11-26: qty 1

## 2010-11-26 MED ORDER — ONDANSETRON HCL 4 MG/2ML IJ SOLN
4.0000 mg | Freq: Once | INTRAMUSCULAR | Status: AC
  Administered 2010-11-26: 4 mg via INTRAVENOUS
  Filled 2010-11-26: qty 2

## 2010-11-26 MED ORDER — MORPHINE SULFATE 2 MG/ML IJ SOLN
2.0000 mg | Freq: Once | INTRAMUSCULAR | Status: DC
  Filled 2010-11-26: qty 1

## 2010-11-26 MED ORDER — HYDROMORPHONE HCL 1 MG/ML IJ SOLN
1.0000 mg | Freq: Once | INTRAMUSCULAR | Status: AC
  Administered 2010-11-26: 1 mg via INTRAVENOUS
  Filled 2010-11-26: qty 1

## 2010-11-26 MED ORDER — KETOROLAC TROMETHAMINE 30 MG/ML IJ SOLN
30.0000 mg | Freq: Once | INTRAMUSCULAR | Status: AC
  Administered 2010-11-26: 30 mg via INTRAVENOUS
  Filled 2010-11-26: qty 1

## 2010-11-26 NOTE — ED Notes (Signed)
Dr. Colon Branch in to speak with patient.

## 2010-11-26 NOTE — ED Notes (Signed)
Patient requested something to drink; was given water after ok from Dr. Colon Branch.

## 2010-12-30 ENCOUNTER — Emergency Department (HOSPITAL_COMMUNITY)
Admission: EM | Admit: 2010-12-30 | Discharge: 2010-12-31 | Disposition: A | Discharge status: Home / Self Care | Payer: Medicare Other | Attending: Emergency Medicine | Admitting: Emergency Medicine

## 2010-12-30 ENCOUNTER — Encounter (HOSPITAL_COMMUNITY): Payer: Self-pay | Admitting: *Deleted

## 2010-12-30 ENCOUNTER — Emergency Department (HOSPITAL_COMMUNITY): Payer: Medicare Other

## 2010-12-30 DIAGNOSIS — R109 Unspecified abdominal pain: Secondary | ICD-10-CM | POA: Insufficient documentation

## 2010-12-30 DIAGNOSIS — T148XXA Other injury of unspecified body region, initial encounter: Secondary | ICD-10-CM | POA: Insufficient documentation

## 2010-12-30 DIAGNOSIS — Z9010 Acquired absence of unspecified breast and nipple: Secondary | ICD-10-CM | POA: Insufficient documentation

## 2010-12-30 DIAGNOSIS — Z859 Personal history of malignant neoplasm, unspecified: Secondary | ICD-10-CM | POA: Insufficient documentation

## 2010-12-30 DIAGNOSIS — Z87891 Personal history of nicotine dependence: Secondary | ICD-10-CM | POA: Insufficient documentation

## 2010-12-30 DIAGNOSIS — E119 Type 2 diabetes mellitus without complications: Secondary | ICD-10-CM | POA: Insufficient documentation

## 2010-12-30 DIAGNOSIS — R0789 Other chest pain: Secondary | ICD-10-CM

## 2010-12-30 DIAGNOSIS — X500XXA Overexertion from strenuous movement or load, initial encounter: Secondary | ICD-10-CM | POA: Insufficient documentation

## 2010-12-30 DIAGNOSIS — R071 Chest pain on breathing: Secondary | ICD-10-CM | POA: Insufficient documentation

## 2010-12-30 MED ORDER — MORPHINE SULFATE 10 MG/ML IJ SOLN: 10.0000 mg | Freq: Once | INTRAMUSCULAR | Status: DC

## 2010-12-30 MED ORDER — HYDROMORPHONE HCL 1 MG/ML IJ SOLN
1.0000 mg | Freq: Once | INTRAMUSCULAR | Status: AC
  Administered 2010-12-31: 1 mg via INTRAMUSCULAR
  Filled 2010-12-30: qty 1

## 2010-12-30 MED ORDER — ONDANSETRON HCL 4 MG PO TABS
4.0000 mg | ORAL_TABLET | Freq: Once | ORAL | Status: AC
Start: 2010-12-30 — End: 2010-12-31
  Administered 2010-12-31: 4 mg via ORAL
  Filled 2010-12-30: qty 1

## 2010-12-30 MED ORDER — DIAZEPAM 5 MG PO TABS
5.0000 mg | ORAL_TABLET | Freq: Once | ORAL | Status: AC
  Administered 2010-12-31: 5 mg via ORAL
  Filled 2010-12-30: qty 1

## 2010-12-30 NOTE — ED Provider Notes (Addendum)
History     CSN: [id number] Arrival date & time: 12/30/2010 10:16 PM  Chief Complaint  Patient presents with  . Rib Injury    HPI  (Consider location/radiation/quality/duration/timing/severity/associated sxs/prior treatment)  Patient is a 40 y.o. female presenting with chest pain. The history is provided by the patient.  Chest Pain The chest pain began yesterday. Chest pain occurs constantly. The chest pain is worsening. The severity of the pain is severe. The quality of the pain is described as aching. The pain does not radiate. Primary symptoms include nausea. Pertinent negatives for primary symptoms include no fever, no syncope, no shortness of breath, no cough, no wheezing, no palpitations, no abdominal pain and no dizziness.  Pertinent negatives for associated symptoms include no near-syncope and no weakness. She tried narcotics for the symptoms. Risk factors: cancer survivor.  Her past medical history is significant for diabetes.  Pertinent negatives for past medical history include no hypertension, no MI and no seizures. Past medical history comments: cancer     Past Medical History  Diagnosis Date  . Diabetes mellitus   . Cancer     Past Surgical History  Procedure Date  . Mastectomy   . Abdominal hysterectomy   . Lymphadenectomy   . Cesarean section     History reviewed. No pertinent family history.  History  Substance Use Topics  . Smoking status: Former Games developer  . Smokeless tobacco: Not on file  . Alcohol Use: No    OB History    Grav Para Term Preterm Abortions TAB SAB Ect Mult Living                  Review of Systems  Review of Systems  Constitutional: Negative for fever and activity change.       All ROS Neg except as noted in HPI  HENT: Negative for nosebleeds and neck pain.   Eyes: Negative for photophobia and discharge.  Respiratory: Negative for cough, shortness of breath and wheezing.   Cardiovascular: Positive for chest pain. Negative for  palpitations, syncope and near-syncope.  Gastrointestinal: Positive for nausea. Negative for abdominal pain and blood in stool.  Genitourinary: Negative for dysuria, frequency and hematuria.  Musculoskeletal: Negative for back pain and arthralgias.  Skin: Negative.   Neurological: Negative for dizziness, seizures, speech difficulty and weakness.  Psychiatric/Behavioral: Negative for hallucinations and confusion.    Allergies  Azithromycin; Nitrofurantoin; Penicillins; and Sulfonamide derivatives  Home Medications   Current Outpatient Rx  Name Route Sig Dispense Refill  . ALPRAZOLAM 1 MG PO TABS Oral Take 1 mg by mouth at bedtime as needed.      . ANASTROZOLE 1 MG PO TABS Oral Take 1 mg by mouth daily.      Marland Kitchen CLONAZEPAM 1 MG PO TABS Oral Take 1 mg by mouth 2 (two) times daily as needed.      . DESVENLAFAXINE SUCCINATE 100 MG PO TB24 Oral Take 100 mg by mouth daily.      Marland Kitchen ESOMEPRAZOLE MAGNESIUM 40 MG PO CPDR Oral Take 40 mg by mouth daily before breakfast.      . FENOFIBRATE 145 MG PO TABS Oral Take 145 mg by mouth daily.      Marland Kitchen HYDROCODONE-ACETAMINOPHEN 7.5-500 MG PO TABS Oral Take 1 tablet by mouth every 6 (six) hours as needed.      . INSULIN ASPART 100 UNIT/ML [place] SOLN Subcutaneous Inject into the skin 3 (three) times daily before meals.      . INSULIN GLARGINE 100  UNIT/ML [place] SOLN Subcutaneous Inject into the skin at bedtime.      Marland Kitchen METFORMIN HCL 1000 MG PO TABS Oral Take 1,000 mg by mouth 2 (two) times daily with a meal.      . ROSUVASTATIN CALCIUM 20 MG PO TABS Oral Take 20 mg by mouth daily.      Marland Kitchen ZOMETA IV Intravenous Inject into the vein.        Physical Exam    BP 131/64  Pulse 89  Temp(Src) 98.6 F (37 C) (Oral)  Resp 20  Ht 5\' 2"  (1.575 m)  Wt 248 lb (112.492 kg)  BMI 45.36 kg/m2  SpO2 99%  Physical Exam  Nursing note and vitals reviewed. Constitutional: She is oriented to person, place, and time. She appears well-developed and well-nourished.  Non-toxic  appearance.  HENT:  Head: Normocephalic.  Right Ear: Tympanic membrane and external ear normal.  Left Ear: Tympanic membrane and external ear normal.  Eyes: EOM and lids are normal. Pupils are equal, round, and reactive to light.  Neck: Normal range of motion. Neck supple. Carotid bruit is not present.  Cardiovascular: Normal rate, regular rhythm, normal heart sounds, intact distal pulses and normal pulses.   Pulmonary/Chest: Breath sounds normal. No respiratory distress.       Right chest wall pain; No crepitus. Not hot.  Abdominal: Soft. Bowel sounds are normal. There is no tenderness. There is no guarding.  Musculoskeletal: Normal range of motion.  Lymphadenopathy:       Head (right side): No submandibular adenopathy present.       Head (left side): No submandibular adenopathy present.    She has no cervical adenopathy.  Neurological: She is alert and oriented to person, place, and time. She has normal strength. No cranial nerve deficit or sensory deficit.  Skin: Skin is warm and dry.  Psychiatric: She has a normal mood and affect. Her speech is normal.    ED Course:Test results given to the patient. No c/o SOB, sweats, N/V or LOC. Safe for discharge.  Procedures (including critical care time)    Dx: Right chest wall pain  MDM I have reviewed nursing notes, vital signs, and all appropriate lab and imaging results for this patient.  Results for orders placed during the hospital encounter of 11/25/10  GLUCOSE, CAPILLARY      Component Value Range   Glucose-Capillary 161 (*) 70 - 99 (mg/dL)  CBC      Component Value Range   WBC 7.6  4.0 - 10.5 (K/uL)   RBC 4.50  3.87 - 5.11 (MIL/uL)   Hemoglobin 12.3  12.0 - 15.0 (g/dL)   HCT 45.4  09.8 - 11.9 (%)   MCV 84.7  78.0 - 100.0 (fL)   MCH 27.3  26.0 - 34.0 (pg)   MCHC 32.3  30.0 - 36.0 (g/dL)   RDW 14.7  82.9 - 56.2 (%)   Platelets 247  150 - 400 (K/uL)  BASIC METABOLIC PANEL      Component Value Range   Sodium 139  135 -  145 (mEq/L)   Potassium 3.6  3.5 - 5.1 (mEq/L)   Chloride 104  96 - 112 (mEq/L)   CO2 28  19 - 32 (mEq/L)   Glucose, Bld 161 (*) 70 - 99 (mg/dL)   BUN 10  6 - 23 (mg/dL)   Creatinine, Ser 1.30  0.50 - 1.10 (mg/dL)   Calcium 9.1  8.4 - 86.5 (mg/dL)   GFR calc non Af Amer >60  >  60 (mL/min)   GFR calc Af Amer >60  >60 (mL/min)  CARDIAC PANEL(CRET KIN+CKTOT+MB+TROPI)      Component Value Range   Total CK 66  7 - 177 (U/L)   CK, MB 2.1  0.3 - 4.0 (ng/mL)   Troponin I <0.30  <0.30 (ng/mL)   Relative Index RELATIVE INDEX IS INVALID  0.0 - 2.5   DIFFERENTIAL      Component Value Range   Neutrophils Relative 49  43 - 77 (%)   Neutro Abs 3.6  1.7 - 7.7 (K/uL)   Lymphocytes Relative 42  12 - 46 (%)   Lymphs Abs 3.1  0.7 - 4.0 (K/uL)   Monocytes Relative 8  3 - 12 (%)   Monocytes Absolute 0.6  0.1 - 1.0 (K/uL)   Eosinophils Relative 1  0 - 5 (%)   Eosinophils Absolute 0.1  0.0 - 0.7 (K/uL)   Basophils Relative 1  0 - 1 (%)   Basophils Absolute 0.0  0.0 - 0.1 (K/uL)   Dg Ribs Unilateral W/chest Right  12/30/2010  *RADIOLOGY REPORT*  Clinical Data: Mid anterior right rib pain status post lifting injury.  RIGHT RIBS AND CHEST - 3+ VIEW  Comparison: Chest radiograph performed 11/25/2010  Findings: No displaced rib fractures are seen.  The lungs are well-aerated and clear.  There is no evidence of focal opacification, pleural effusion or pneumothorax.  The cardiomediastinal silhouette is within normal limits.  No acute osseous abnormalities are seen.  Clips are noted overlying the lateral right chest wall.  IMPRESSION: No displaced rib fractures seen; no acute cardiopulmonary process identified.  Original Report Authenticated By: Tonia Ghent, M.D.         Kathie Dike, PA 12/31/10 0008  Kathie Dike, PA 01/08/11 5784  Kathie Dike, PA 01/15/11 6962  Kathie Dike, PA 01/29/11 1451

## 2010-12-30 NOTE — ED Notes (Signed)
Pt c/o right rib cage pain. Pt states she was putting a sheet on the bed and heard a pop and crackle.

## 2010-12-31 MED ORDER — DIAZEPAM 5 MG PO TABS: ORAL_TABLET | ORAL | Status: DC

## 2011-01-09 LAB — CBC
HCT: 35.5 — ABNORMAL LOW
Hemoglobin: 12.1
MCHC: 34.3
MCV: 83.1
Platelets: 252
RBC: 4.26
RDW: 15.2
WBC: 5.3

## 2011-01-09 LAB — DIFFERENTIAL
Basophils Absolute: 0
Basophils Relative: 0
Eosinophils Absolute: 0.1
Eosinophils Relative: 3
Lymphocytes Relative: 30
Lymphs Abs: 1.6
Monocytes Absolute: 0.4
Monocytes Relative: 7
Neutro Abs: 3.2
Neutrophils Relative %: 60

## 2011-01-09 LAB — BASIC METABOLIC PANEL
BUN: 13
CO2: 28
Calcium: 9.1
Chloride: 102
Creatinine, Ser: 0.64
GFR calc Af Amer: >60
GFR calc non Af Amer: >60
Glucose, Bld: 305 — ABNORMAL HIGH
Potassium: 4.1
Sodium: 137

## 2011-01-16 NOTE — ED Provider Notes (Signed)
Evaluation and management procedures were performed by the PA/NP under my supervision/collaboration.   Dione Booze, MD 01/16/11 2029

## 2011-01-30 NOTE — ED Provider Notes (Signed)
Evaluation and management procedures were performed by the PA/NP under my supervision/collaboration.   Dione Booze, MD 01/30/11 [phone]

## 2011-03-15 ENCOUNTER — Ambulatory Visit (INDEPENDENT_AMBULATORY_CARE_PROVIDER_SITE_OTHER): Payer: Medicare Other

## 2011-03-15 DIAGNOSIS — E785 Hyperlipidemia, unspecified: Secondary | ICD-10-CM

## 2011-03-15 DIAGNOSIS — E109 Type 1 diabetes mellitus without complications: Secondary | ICD-10-CM

## 2011-04-30 ENCOUNTER — Ambulatory Visit: Payer: Medicare Other | Admitting: Family Medicine

## 2011-05-08 ENCOUNTER — Telehealth: Payer: Self-pay | Admitting: Family Medicine

## 2011-05-08 NOTE — Telephone Encounter (Signed)
Pt states Dr Hal Hope is assisting her with info regarding GAP coverage and that Dr Wendee Copp nurse has been in contact with her regarding info required. Please call pt at [phone] to discuss

## 2011-05-09 NOTE — Telephone Encounter (Signed)
LMOM to CB with information about what insurance needs from Dr. Hal Hope. If a letter is needed, to call with information who to send it to. Also told pt I have gotten the NPI and Tax ID numbers that we needed.

## 2011-05-09 NOTE — Telephone Encounter (Signed)
Ann Lopez, Let's touch base regarding this patient!  Thanks! KR

## 2011-05-10 ENCOUNTER — Telehealth: Payer: Self-pay

## 2011-05-10 NOTE — Telephone Encounter (Signed)
Called Medicare for GAP exception for pt and was asked procedure codes/billing ?s for surgery and other MDs which we do not know. Representative suggested that surgeon and other specialists call themselves for GAP. Called Berlinda at Dr. Leeanne Mannan office and explained and gave her # to call. Called and [facility] for pt to CB so that she can be updated.

## 2011-05-11 NOTE — Telephone Encounter (Signed)
Pt notified that GAP could not be done by her surgeon. Pt verbalized understanding and said she would just pay the higher co-pay for surgery. Thanked [place] for our effort.

## 2011-06-12 ENCOUNTER — Other Ambulatory Visit: Payer: Self-pay | Admitting: Family Medicine

## 2011-07-14 ENCOUNTER — Other Ambulatory Visit: Payer: Self-pay | Admitting: Family Medicine

## 2011-07-14 MED ORDER — HYDROCODONE-ACETAMINOPHEN 7.5-500 MG PO TABS: 1.0000 | ORAL_TABLET | Freq: Two times a day (BID) | ORAL | Status: DC | PRN

## 2011-08-17 ENCOUNTER — Ambulatory Visit (INDEPENDENT_AMBULATORY_CARE_PROVIDER_SITE_OTHER): Payer: Medicare Other

## 2011-08-17 ENCOUNTER — Telehealth: Payer: Self-pay

## 2011-08-17 MED ORDER — GLUCOSE BLOOD VI STRP: ORAL_STRIP | Status: DC

## 2011-08-17 NOTE — Telephone Encounter (Signed)
Please tell patient no request was ever received by [place]. Refill sent in.

## 2011-08-17 NOTE — Telephone Encounter (Signed)
LMOM to call back

## 2011-08-17 NOTE — Telephone Encounter (Signed)
.  [facility] The patient stated that her pharmacy has sent of refill requests for her diabetic test strips four times and has received no response from [facility].  The patient has a OneTouch Ultra II meter. The patient would [facility] [address].  Please call the patient at [phone].

## 2011-08-17 NOTE — Telephone Encounter (Signed)
Can we fill this request?

## 2011-08-20 ENCOUNTER — Ambulatory Visit (INDEPENDENT_AMBULATORY_CARE_PROVIDER_SITE_OTHER): Payer: Medicare Other | Admitting: Family Medicine

## 2011-08-20 ENCOUNTER — Encounter: Payer: Self-pay | Admitting: Family Medicine

## 2011-08-20 VITALS — BP 144/93 | HR 84 | Temp 98.4°F | Resp 16 | Ht 62.0 in | Wt 211.4 lb

## 2011-08-20 DIAGNOSIS — K219 Gastro-esophageal reflux disease without esophagitis: Secondary | ICD-10-CM

## 2011-08-20 DIAGNOSIS — R634 Abnormal weight loss: Secondary | ICD-10-CM

## 2011-08-20 DIAGNOSIS — I10 Essential (primary) hypertension: Secondary | ICD-10-CM

## 2011-08-20 DIAGNOSIS — F411 Generalized anxiety disorder: Secondary | ICD-10-CM

## 2011-08-20 DIAGNOSIS — E119 Type 2 diabetes mellitus without complications: Secondary | ICD-10-CM

## 2011-08-20 DIAGNOSIS — C50919 Malignant neoplasm of unspecified site of unspecified female breast: Secondary | ICD-10-CM

## 2011-08-20 DIAGNOSIS — M545 Low back pain: Secondary | ICD-10-CM

## 2011-08-20 DIAGNOSIS — F341 Dysthymic disorder: Secondary | ICD-10-CM

## 2011-08-20 DIAGNOSIS — J309 Allergic rhinitis, unspecified: Secondary | ICD-10-CM

## 2011-08-20 DIAGNOSIS — E781 Pure hyperglyceridemia: Secondary | ICD-10-CM

## 2011-08-20 DIAGNOSIS — E785 Hyperlipidemia, unspecified: Secondary | ICD-10-CM

## 2011-08-20 LAB — POCT CBC
Hemoglobin: 13.9 g/dL (ref 12.2–16.2)
Lymph, poc: 3.6 — AB (ref 0.6–3.4)
MCHC: 33.1 g/dL (ref 31.8–35.4)
MID (cbc): 0.5 (ref 0–0.9)
MPV: 9.2 fL (ref 0–99.8)
POC Granulocyte: 6.8 (ref 2–6.9)
POC MID %: 4.6 %M (ref 0–12)
Platelet Count, POC: 288 10*3/uL (ref 142–424)
RDW, POC: 15.8 %

## 2011-08-20 MED ORDER — OMEPRAZOLE 20 MG PO CPDR: 20.0000 mg | DELAYED_RELEASE_CAPSULE | Freq: Every day | ORAL | Status: DC

## 2011-08-20 MED ORDER — GLUCOSE BLOOD VI STRP: ORAL_STRIP | Status: DC

## 2011-08-20 MED ORDER — CLONAZEPAM 1 MG PO TABS: 1.0000 mg | ORAL_TABLET | Freq: Two times a day (BID) | ORAL | Status: DC | PRN

## 2011-08-20 MED ORDER — DESVENLAFAXINE SUCCINATE ER 100 MG PO TB24: 100.0000 mg | ORAL_TABLET | Freq: Every day | ORAL | Status: DC

## 2011-08-20 MED ORDER — ALPRAZOLAM 1 MG PO TABS: 1.0000 mg | ORAL_TABLET | Freq: Every evening | ORAL | Status: DC | PRN

## 2011-08-20 MED ORDER — HYDROCODONE-ACETAMINOPHEN 7.5-500 MG PO TABS: 1.0000 | ORAL_TABLET | Freq: Two times a day (BID) | ORAL | Status: DC | PRN

## 2011-08-20 NOTE — Progress Notes (Signed)
  Subjective:    Patient ID: Ann Lopez, female    DOB: September 21, 1970, 41 y.o.   MRN: [id number]  HPI  Patient presents in routine follow up  Status post bariatric surgery(gastric sleeve) 2/13.  Adjusting well to dietary and exercise requirements. Pre surgery peak weight 258, current weight 211  IDDM- blood sugars trending down; currently off Lantus and Novolog insulin.  Controlling blood sugar            with Metformin ER 500 mg BID  Severe dyslipidemia- off all medications; not fasting today  Chronic anxiety- requesting medication refills  LBP- recent flair with increase activity  Seasonal allergies- congestion, PND and occasional cough.  Review of Systems     Objective:   Physical Exam  Constitutional: She appears well-developed.  HENT:       (B) TM's retracted; clear PND   Neck: Neck supple.  Cardiovascular: Normal rate, regular rhythm and normal heart sounds.   Pulmonary/Chest: Effort normal and breath sounds normal.  Abdominal: Soft. Bowel sounds are normal.  Lymphadenopathy:    She has cervical adenopathy (shoddy anterior nodes).  Neurological: She is alert.  Skin: Skin is warm.       Results for orders placed in visit on 08/20/11  POCT CBC      Component Value Range   WBC 10.9 (*) 4.6 - 10.2 (K/uL)   Lymph, poc 3.6 (*) 0.6 - 3.4    POC LYMPH PERCENT 32.9  10 - 50 (%L)   MID (cbc) 0.5  0 - 0.9    POC MID % 4.6  0 - 12 (%M)   POC Granulocyte 6.8  2 - 6.9    Granulocyte percent 62.5  37 - 80 (%G)   RBC 5.10  4.04 - 5.48 (M/uL)   Hemoglobin 13.9  12.2 - 16.2 (g/dL)   HCT, POC 40.9  81.1 - 47.9 (%)   MCV 82.4  80 - 97 (fL)   MCH, POC 27.3  27 - 31.2 (pg)   MCHC 33.1  31.8 - 35.4 (g/dL)   RDW, POC 91.4     Platelet Count, POC 288  142 - 424 (K/uL)   MPV 9.2  0 - 99.8 (fL)    Assessment & Plan:  Dramatic weight loss; s/p bariatric surgery Viral illness Type 2 DM Severe dyslipidemia Chronic Anxiety BRCA -1; s/p breast cancer; s/p  TAH/BSO LBP   See medication refills on AVS Follow up CPE

## 2011-08-22 ENCOUNTER — Other Ambulatory Visit: Payer: Self-pay | Admitting: Physician Assistant

## 2011-08-22 MED ORDER — GLUCOSE BLOOD VI STRP: ORAL_STRIP | Status: DC

## 2011-08-22 NOTE — Progress Notes (Signed)
Pharmacy requested written rx for test strips. Done

## 2011-09-02 ENCOUNTER — Ambulatory Visit (INDEPENDENT_AMBULATORY_CARE_PROVIDER_SITE_OTHER): Payer: Medicare Other | Admitting: Family Medicine

## 2011-09-02 VITALS — BP 162/89 | HR 74 | Temp 98.3°F | Resp 16 | Ht 62.0 in | Wt 209.0 lb

## 2011-09-02 DIAGNOSIS — E119 Type 2 diabetes mellitus without complications: Secondary | ICD-10-CM

## 2011-09-02 DIAGNOSIS — J45909 Unspecified asthma, uncomplicated: Secondary | ICD-10-CM

## 2011-09-02 LAB — POCT CBC
HCT, POC: 43.4 % (ref 37.7–47.9)
Hemoglobin: 14.1 g/dL (ref 12.2–16.2)
MCH, POC: 26.9 pg — AB (ref 27–31.2)
MCV: 82.8 fL (ref 80–97)
RBC: 5.24 M/uL (ref 4.04–5.48)
WBC: 10.2 10*3/uL (ref 4.6–10.2)

## 2011-09-02 MED ORDER — HYDROCODONE-HOMATROPINE 5-1.5 MG/5ML PO SYRP: 5.0000 mL | ORAL_SOLUTION | Freq: Three times a day (TID) | ORAL | Status: AC | PRN

## 2011-09-02 MED ORDER — PREDNISONE 20 MG PO TABS: ORAL_TABLET | ORAL | Status: DC

## 2011-09-02 MED ORDER — IPRATROPIUM BROMIDE 0.02 % IN SOLN
0.5000 mg | Freq: Once | RESPIRATORY_TRACT | Status: AC
  Administered 2011-09-02: 0.5 mg via RESPIRATORY_TRACT

## 2011-09-02 MED ORDER — ALBUTEROL SULFATE HFA 108 (90 BASE) MCG/ACT IN AERS: 1.0000 | INHALATION_SPRAY | Freq: Four times a day (QID) | RESPIRATORY_TRACT | Status: DC | PRN

## 2011-09-02 MED ORDER — FLUTICASONE PROPIONATE 50 MCG/ACT NA SUSP: 2.0000 | Freq: Every day | NASAL | Status: DC

## 2011-09-02 MED ORDER — ALBUTEROL SULFATE (2.5 MG/3ML) 0.083% IN NEBU
2.5000 mg | INHALATION_SOLUTION | Freq: Once | RESPIRATORY_TRACT | Status: AC
  Administered 2011-09-02: 2.5 mg via RESPIRATORY_TRACT

## 2011-09-02 NOTE — Progress Notes (Signed)
  Subjective:    Patient ID: Ann Lopez, female    DOB: 29-Jun-1970, 41 y.o.   MRN: [id number]  HPI  Patient presents with 7- 10 day history of facial congestion, pnd and cough. Cough productive of yellow sputum and present both during the day and night Intermittent ST  Secondary to persistent cough patient has developed chest wall pain. Using Nyquil without relief  Chills without fever  See extensive PMH Review of Systems    No wheezing Objective:   Physical Exam  Constitutional: She appears well-developed.  HENT:       Turbinates inflamed; clear drainage  Neck: Neck supple. No thyromegaly present.  Cardiovascular: Normal rate, regular rhythm and normal heart sounds.   Pulmonary/Chest: Effort normal.       Coarse BS; faint expiratory wheezes  Lymphadenopathy:    She has no cervical adenopathy.  Skin: Skin is warm.     Results for orders placed in visit on 09/02/11  POCT CBC      Component Value Range   WBC 10.2  4.6 - 10.2 (K/uL)   Lymph, poc 2.8  0.6 - 3.4    POC LYMPH PERCENT 27.1  10 - 50 (%L)   MID (cbc) 0.5  0 - 0.9    POC MID % 5.0  0 - 12 (%M)   POC Granulocyte 6.9  2 - 6.9    Granulocyte percent 67.9  37 - 80 (%G)   RBC 5.24  4.04 - 5.48 (M/uL)   Hemoglobin 14.1  12.2 - 16.2 (g/dL)   HCT, POC 40.9  81.1 - 47.9 (%)   MCV 82.8  80 - 97 (fL)   MCH, POC 26.9 (*) 27 - 31.2 (pg)   MCHC 32.5  31.8 - 35.4 (g/dL)   RDW, POC 91.4     Platelet Count, POC 263  142 - 424 (K/uL)   MPV 8.7  0 - 99.8 (fL)        Assessment & Plan:   1. Asthmatic bronchitis  POCT CBC, albuterol (PROVENTIL) (2.5 MG/3ML) 0.083% nebulizer solution 2.5 mg, ipratropium (ATROVENT) nebulizer solution 0.5 mg, predniSONE (DELTASONE) 20 MG tablet, albuterol (PROVENTIL HFA;VENTOLIN HFA) 108 (90 BASE) MCG/ACT inhaler, fluticasone (FLONASE) 50 MCG/ACT nasal spray

## 2011-09-10 ENCOUNTER — Ambulatory Visit (INDEPENDENT_AMBULATORY_CARE_PROVIDER_SITE_OTHER): Payer: Medicare Other | Admitting: Family Medicine

## 2011-09-10 ENCOUNTER — Encounter: Payer: Self-pay | Admitting: Family Medicine

## 2011-09-10 ENCOUNTER — Telehealth: Payer: Self-pay | Admitting: *Deleted

## 2011-09-10 VITALS — BP 145/96 | HR 68 | Temp 97.7°F | Resp 16 | Ht 62.0 in | Wt 206.8 lb

## 2011-09-10 DIAGNOSIS — F341 Dysthymic disorder: Secondary | ICD-10-CM

## 2011-09-10 DIAGNOSIS — Z1501 Genetic susceptibility to malignant neoplasm of breast: Secondary | ICD-10-CM | POA: Insufficient documentation

## 2011-09-10 DIAGNOSIS — E785 Hyperlipidemia, unspecified: Secondary | ICD-10-CM | POA: Insufficient documentation

## 2011-09-10 DIAGNOSIS — J309 Allergic rhinitis, unspecified: Secondary | ICD-10-CM

## 2011-09-10 DIAGNOSIS — Z1509 Genetic susceptibility to other malignant neoplasm: Secondary | ICD-10-CM

## 2011-09-10 DIAGNOSIS — Z0000 Encounter for general adult medical examination without abnormal findings: Secondary | ICD-10-CM

## 2011-09-10 DIAGNOSIS — M545 Low back pain: Secondary | ICD-10-CM

## 2011-09-10 DIAGNOSIS — K219 Gastro-esophageal reflux disease without esophagitis: Secondary | ICD-10-CM

## 2011-09-10 DIAGNOSIS — Z9884 Bariatric surgery status: Secondary | ICD-10-CM

## 2011-09-10 DIAGNOSIS — I10 Essential (primary) hypertension: Secondary | ICD-10-CM

## 2011-09-10 DIAGNOSIS — E119 Type 2 diabetes mellitus without complications: Secondary | ICD-10-CM

## 2011-09-10 DIAGNOSIS — E109 Type 1 diabetes mellitus without complications: Secondary | ICD-10-CM

## 2011-09-10 DIAGNOSIS — C50919 Malignant neoplasm of unspecified site of unspecified female breast: Secondary | ICD-10-CM | POA: Insufficient documentation

## 2011-09-10 MED ORDER — HYDROCODONE-ACETAMINOPHEN 7.5-500 MG PO TABS: 1.0000 | ORAL_TABLET | Freq: Two times a day (BID) | ORAL | Status: DC | PRN

## 2011-09-10 MED ORDER — HYDROCODONE-ACETAMINOPHEN 7.5-500 MG PO TABS: 1.0000 | ORAL_TABLET | Freq: Three times a day (TID) | ORAL | Status: DC | PRN
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
## 2011-09-10 NOTE — Telephone Encounter (Signed)
Patient called concerning medication Vicodin. [facility]-up medication and it was not called or Escribed. I spoke to Dr Hal Hope and was advised to call-in Rx. I called the pharmacy and spoked   to [facility] (pharmacist) I gave Rx for Vicodin 7.5-500 mg 1 po q 8 hrs prn for pain #30 w/ 1RF. Patient advised.

## 2011-09-11 LAB — LIPID PANEL
Cholesterol: 118 mg/dL (ref 0–200)
HDL: 28 mg/dL — ABNORMAL LOW (ref >39)
LDL Cholesterol: 28 mg/dL (ref 0–99)
Triglycerides: 309 mg/dL — ABNORMAL HIGH (ref <150)
VLDL: 62 mg/dL — ABNORMAL HIGH (ref 0–40)

## 2011-09-11 LAB — COMPREHENSIVE METABOLIC PANEL
AST: 20 U/L (ref 0–37)
Alkaline Phosphatase: 56 U/L (ref 39–117)
BUN: 9 mg/dL (ref 6–23)
Calcium: 9.6 mg/dL (ref 8.4–10.5)
Creat: 0.59 mg/dL (ref 0.50–1.10)
Glucose, Bld: 113 mg/dL — ABNORMAL HIGH (ref 70–99)

## 2011-09-15 ENCOUNTER — Encounter (HOSPITAL_COMMUNITY): Payer: Self-pay | Admitting: *Deleted

## 2011-09-15 ENCOUNTER — Emergency Department (HOSPITAL_COMMUNITY)
Admission: EM | Admit: 2011-09-15 | Discharge: 2011-09-15 | Disposition: A | Discharge status: Home / Self Care | Payer: Medicare Other | Attending: Emergency Medicine | Admitting: Emergency Medicine

## 2011-09-15 ENCOUNTER — Emergency Department (HOSPITAL_COMMUNITY): Payer: Medicare Other

## 2011-09-15 DIAGNOSIS — Z853 Personal history of malignant neoplasm of breast: Secondary | ICD-10-CM | POA: Insufficient documentation

## 2011-09-15 DIAGNOSIS — M47817 Spondylosis without myelopathy or radiculopathy, lumbosacral region: Secondary | ICD-10-CM | POA: Insufficient documentation

## 2011-09-15 DIAGNOSIS — E119 Type 2 diabetes mellitus without complications: Secondary | ICD-10-CM | POA: Insufficient documentation

## 2011-09-15 DIAGNOSIS — M5430 Sciatica, unspecified side: Secondary | ICD-10-CM | POA: Insufficient documentation

## 2011-09-15 DIAGNOSIS — M549 Dorsalgia, unspecified: Secondary | ICD-10-CM | POA: Insufficient documentation

## 2011-09-15 DIAGNOSIS — Z794 Long term (current) use of insulin: Secondary | ICD-10-CM | POA: Insufficient documentation

## 2011-09-15 HISTORY — DX: Other specified disorders of bone density and structure, unspecified site: M85.80

## 2011-09-15 HISTORY — DX: Malignant neoplasm of unspecified site of unspecified female breast: C50.919

## 2011-09-15 HISTORY — DX: Dorsalgia, unspecified: M54.9

## 2011-09-15 MED ORDER — PREDNISONE 20 MG PO TABS: ORAL_TABLET | ORAL | Status: AC

## 2011-09-15 MED ORDER — HYDROMORPHONE HCL PF 1 MG/ML IJ SOLN
1.0000 mg | Freq: Once | INTRAMUSCULAR | Status: AC
  Administered 2011-09-15: 1 mg via INTRAMUSCULAR
  Filled 2011-09-15: qty 1

## 2011-09-15 MED ORDER — DIAZEPAM 5 MG PO TABS: 5.0000 mg | ORAL_TABLET | Freq: Three times a day (TID) | ORAL | Status: AC | PRN

## 2011-09-15 MED ORDER — DIAZEPAM 5 MG PO TABS
5.0000 mg | ORAL_TABLET | Freq: Once | ORAL | Status: AC
  Administered 2011-09-15: 5 mg via ORAL
  Filled 2011-09-15: qty 1
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
## 2011-09-15 NOTE — ED Provider Notes (Signed)
Medical screening examination/treatment/procedure(s) were performed by non-physician practitioner and as supervising physician I was immediately available for consultation/collaboration.   Vence Lalor L Frederik Standley, MD 09/15/11 0751 

## 2011-10-04 ENCOUNTER — Other Ambulatory Visit: Payer: Self-pay

## 2011-10-04 MED ORDER — HYDROCODONE-ACETAMINOPHEN 7.5-500 MG PO TABS: 1.0000 | ORAL_TABLET | Freq: Four times a day (QID) | ORAL | Status: DC | PRN

## 2011-10-08 ENCOUNTER — Other Ambulatory Visit: Payer: Self-pay | Admitting: Family Medicine

## 2011-10-26 ENCOUNTER — Ambulatory Visit (INDEPENDENT_AMBULATORY_CARE_PROVIDER_SITE_OTHER): Payer: Medicare Other | Admitting: Family Medicine

## 2011-10-26 VITALS — HR 78 | Temp 97.8°F | Resp 17 | Ht 62.0 in | Wt 203.0 lb

## 2011-10-26 DIAGNOSIS — C50919 Malignant neoplasm of unspecified site of unspecified female breast: Secondary | ICD-10-CM

## 2011-10-26 DIAGNOSIS — E119 Type 2 diabetes mellitus without complications: Secondary | ICD-10-CM

## 2011-10-26 DIAGNOSIS — E669 Obesity, unspecified: Secondary | ICD-10-CM

## 2011-10-26 MED ORDER — HYDROCODONE-ACETAMINOPHEN 7.5-750 MG PO TABS: ORAL_TABLET | ORAL | Status: DC

## 2011-10-26 NOTE — Patient Instructions (Addendum)
Back exercise instruction booklet  Try to minimize the use of the hydrocodone.  Recommend following up with your back physician at [facility]  Continue to work hard on weight loss
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
## 2011-11-12 ENCOUNTER — Telehealth: Payer: Self-pay

## 2011-11-12 NOTE — Telephone Encounter (Signed)
Pt states she came into office and filled out a mrl form to have her records sent over to [facility] and they havent received her medical records yet and she really needs to see a dr. Please contact pt and let her know what's going on. [phone]

## 2011-11-13 NOTE — Telephone Encounter (Signed)
LMOM that I just sent over the records to [facility] and I was sorry that it took so long.

## 2011-11-14 ENCOUNTER — Ambulatory Visit (INDEPENDENT_AMBULATORY_CARE_PROVIDER_SITE_OTHER): Payer: Medicare Other | Admitting: Family Medicine

## 2011-11-14 VITALS — BP 120/80 | HR 79 | Temp 97.8°F | Resp 18 | Ht 62.0 in | Wt 204.0 lb

## 2011-11-14 DIAGNOSIS — M549 Dorsalgia, unspecified: Secondary | ICD-10-CM

## 2011-11-14 DIAGNOSIS — H60509 Unspecified acute noninfective otitis externa, unspecified ear: Secondary | ICD-10-CM

## 2011-11-14 DIAGNOSIS — G4700 Insomnia, unspecified: Secondary | ICD-10-CM

## 2011-11-14 DIAGNOSIS — H60549 Acute eczematoid otitis externa, unspecified ear: Secondary | ICD-10-CM

## 2011-11-14 MED ORDER — NEOMYCIN-POLYMYXIN-HC 3.5-10000-1 OT SOLN: 3.0000 [drp] | Freq: Three times a day (TID) | OTIC | Status: AC | PRN

## 2011-11-14 MED ORDER — HYDROCODONE-ACETAMINOPHEN 5-500 MG PO TABS: 1.0000 | ORAL_TABLET | Freq: Two times a day (BID) | ORAL | Status: DC | PRN

## 2011-11-14 MED ORDER — HYDROCODONE-ACETAMINOPHEN 7.5-500 MG PO TABS: 1.0000 | ORAL_TABLET | Freq: Two times a day (BID) | ORAL | Status: DC | PRN

## 2011-11-14 NOTE — Addendum Note (Signed)
Addended by: Elvina Sidle on: 11/14/2011 10:10 AM   Modules accepted: Orders

## 2011-11-14 NOTE — Patient Instructions (Signed)
Try melatonin 2 to 4 mg at night  Insomnia Insomnia is frequent trouble falling and/or staying asleep. Insomnia can be a long term problem or a short term problem. Both are common. Insomnia can be a short term problem when the wakefulness is related to a certain stress or worry. Long term insomnia is often related to ongoing stress during waking hours and/or poor sleeping habits. Overtime, sleep deprivation itself can make the problem worse. Every little thing feels more severe because you are overtired and your ability to cope is decreased. CAUSES   Stress, anxiety, and depression.   Poor sleeping habits.   Distractions such as TV in the bedroom.   Naps close to bedtime.   Engaging in emotionally charged conversations before bed.   Technical reading before sleep.   Alcohol and other sedatives. They may make the problem worse. They can hurt normal sleep patterns and normal dream activity.   Stimulants such as caffeine for several hours prior to bedtime.   Pain syndromes and shortness of breath can cause insomnia.   Exercise late at night.   Changing time zones may cause sleeping problems (jet lag).  It is sometimes helpful to have someone observe your sleeping patterns. They should look for periods of not breathing during the night (sleep apnea). They should also look to see how long those periods last. If you live alone or observers are uncertain, you can also be observed at a sleep clinic where your sleep patterns will be professionally monitored. Sleep apnea requires a checkup and treatment. Give your caregivers your medical history. Give your caregivers observations your family has made about your sleep.  SYMPTOMS   Not feeling rested in the morning.   Anxiety and restlessness at bedtime.   Difficulty falling and staying asleep.  TREATMENT   Your caregiver may prescribe treatment for an underlying medical disorders. Your caregiver can give advice or help if you are using  alcohol or other drugs for self-medication. Treatment of underlying problems will usually eliminate insomnia problems.   Medications can be prescribed for short time use. They are generally not recommended for lengthy use.   Over-the-counter sleep medicines are not recommended for lengthy use. They can be habit forming.   You can promote easier sleeping by making lifestyle changes such as:   Using relaxation techniques that help with breathing and reduce muscle tension.   Exercising earlier in the day.   Changing your diet and the time of your last meal. No night time snacks.   Establish a regular time to go to bed.   Counseling can help with stressful problems and worry.   Soothing music and white noise may be helpful if there are background noises you cannot remove.   Stop tedious detailed work at least one hour before bedtime.  HOME CARE INSTRUCTIONS   Keep a diary. Inform your caregiver about your progress. This includes any medication side effects. See your caregiver regularly. Take note of:   Times when you are asleep.   Times when you are awake during the night.   The quality of your sleep.   How you feel the next day.  This information will help your caregiver care for you.  Get out of bed if you are still awake after 15 minutes. Read or do some quiet activity. Keep the lights down. Wait until you feel sleepy and go back to bed.   Keep regular sleeping and waking hours. Avoid naps.   Exercise regularly.   Avoid  distractions at bedtime. Distractions include watching television or engaging in any intense or detailed activity like attempting to balance the household checkbook.   Develop a bedtime ritual. Keep a familiar routine of bathing, brushing your teeth, climbing into bed at the same time each night, listening to soothing music. Routines increase the success of falling to sleep faster.   Use relaxation techniques. This can be using breathing and muscle tension  release routines. It can also include visualizing peaceful scenes. You can also help control troubling or intruding thoughts by keeping your mind occupied with boring or repetitive thoughts like the old concept of counting sheep. You can make it more creative like imagining planting one beautiful flower after another in your backyard garden.   During your day, work to eliminate stress. When this is not possible use some of the previous suggestions to help reduce the anxiety that accompanies stressful situations.  MAKE SURE YOU:   Understand these instructions.   Will watch your condition.   Will get help right away if you are not doing well or get worse.  Document Released: 03/23/2000 Document Revised: 03/15/2011 Document Reviewed: 04/23/2007 [facility] Patient Information 2012 [place], [place].

## 2011-11-14 NOTE — Progress Notes (Signed)
41 yo woman with Degenerative Disc disease who has been on chronic narcotic medications.  She attributes some of the pain to her past chemo for breast ca.  She has been exercising more at [employer] and the [employer].  Objective:  Moves slowly, citing low back pain  SLR negative Reflexes:  Trace KJ and AJ No muscle wasting in legs Sensory exam normal  Assessment:  Chronic low back pain with chronic narcotic use and osteoporosis hx.  1. Back pain    Lately, her ears have been aching and itching.  No hearing loss.  No significant vertigo, but occasionally loses balance.  O:  TM's scant amount of clear fluid Canals are redded without exudates  A:  Eczema of ear canals  P:  Cortisporin otic tid prn  Also, she's having problems sleeping (falls asleep at 2 or 3 each night)

## 2011-11-28 ENCOUNTER — Encounter (HOSPITAL_COMMUNITY): Payer: Self-pay | Admitting: Neurology

## 2011-11-28 ENCOUNTER — Ambulatory Visit (INDEPENDENT_AMBULATORY_CARE_PROVIDER_SITE_OTHER): Payer: Medicare Other | Admitting: Family Medicine

## 2011-11-28 ENCOUNTER — Emergency Department (HOSPITAL_COMMUNITY): Payer: Medicare Other

## 2011-11-28 ENCOUNTER — Emergency Department (HOSPITAL_COMMUNITY)
Admission: EM | Admit: 2011-11-28 | Discharge: 2011-11-28 | Disposition: A | Discharge status: Home / Self Care | Payer: Medicare Other | Attending: Emergency Medicine | Admitting: Emergency Medicine

## 2011-11-28 VITALS — BP 147/92 | HR 98 | Temp 98.3°F | Resp 18 | Ht 62.0 in | Wt 205.0 lb

## 2011-11-28 DIAGNOSIS — M899 Disorder of bone, unspecified: Secondary | ICD-10-CM | POA: Insufficient documentation

## 2011-11-28 DIAGNOSIS — Z79899 Other long term (current) drug therapy: Secondary | ICD-10-CM | POA: Insufficient documentation

## 2011-11-28 DIAGNOSIS — E119 Type 2 diabetes mellitus without complications: Secondary | ICD-10-CM

## 2011-11-28 DIAGNOSIS — R079 Chest pain, unspecified: Secondary | ICD-10-CM

## 2011-11-28 DIAGNOSIS — Z853 Personal history of malignant neoplasm of breast: Secondary | ICD-10-CM | POA: Insufficient documentation

## 2011-11-28 DIAGNOSIS — M79609 Pain in unspecified limb: Secondary | ICD-10-CM

## 2011-11-28 DIAGNOSIS — Z794 Long term (current) use of insulin: Secondary | ICD-10-CM | POA: Insufficient documentation

## 2011-11-28 DIAGNOSIS — M542 Cervicalgia: Secondary | ICD-10-CM | POA: Insufficient documentation

## 2011-11-28 DIAGNOSIS — M25519 Pain in unspecified shoulder: Secondary | ICD-10-CM | POA: Insufficient documentation

## 2011-11-28 DIAGNOSIS — M79603 Pain in arm, unspecified: Secondary | ICD-10-CM

## 2011-11-28 DIAGNOSIS — M25512 Pain in left shoulder: Secondary | ICD-10-CM

## 2011-11-28 DIAGNOSIS — I10 Essential (primary) hypertension: Secondary | ICD-10-CM

## 2011-11-28 DIAGNOSIS — E785 Hyperlipidemia, unspecified: Secondary | ICD-10-CM

## 2011-11-28 LAB — CBC WITH DIFFERENTIAL/PLATELET
Basophils Absolute: 0.1 10*3/uL (ref 0.0–0.1)
Eosinophils Absolute: 0.1 10*3/uL (ref 0.0–0.7)
Eosinophils Relative: 1 % (ref 0–5)
HCT: 40.3 % (ref 36.0–46.0)
Lymphocytes Relative: 22 % (ref 12–46)
Lymphs Abs: 3.5 10*3/uL (ref 0.7–4.0)
MCH: 28 pg (ref 26.0–34.0)
MCV: 82.4 fL (ref 78.0–100.0)
Monocytes Absolute: 0.7 10*3/uL (ref 0.1–1.0)
Platelets: 238 10*3/uL (ref 150–400)
RDW: 14.9 % (ref 11.5–15.5)

## 2011-11-28 LAB — BASIC METABOLIC PANEL
CO2: 27 mEq/L (ref 19–32)
Calcium: 9.9 mg/dL (ref 8.4–10.5)
Creatinine, Ser: 0.62 mg/dL (ref 0.50–1.10)
GFR calc non Af Amer: >90 mL/min (ref >90)
Glucose, Bld: 139 mg/dL — ABNORMAL HIGH (ref 70–99)
Sodium: 139 mEq/L (ref 135–145)

## 2011-11-28 MED ORDER — ACETAMINOPHEN 325 MG PO TABS
650.0000 mg | ORAL_TABLET | Freq: Once | ORAL | Status: AC
  Administered 2011-11-28: 650 mg via ORAL
  Filled 2011-11-28: qty 2

## 2011-11-28 MED ORDER — ONDANSETRON HCL 4 MG/2ML IJ SOLN
4.0000 mg | Freq: Once | INTRAMUSCULAR | Status: AC
  Administered 2011-11-28: 4 mg via INTRAVENOUS
  Filled 2011-11-28: qty 2

## 2011-11-28 NOTE — Progress Notes (Signed)
Subjective:    Patient ID: Ann Lopez, female    DOB: 1970/11/20, 41 y.o.   MRN: [id number]  HPI Ann Lopez is a 41 y.o. female Hx of depression, dyslipidemia, htn, obesity, DM-insulin dependent, and hx of breast cancer may 2009, s/p bilateral mastectomy.  Today c/o chest pain - L sided and middle over past week, dull, pulled sensation, slight pressure. No sweating, lightheadedness, dizziness with chest pain..  Pain last night - felt move up to L neck and down the back of the L arm.  Dull ache/pain.  Unable to reproduce with pressing on area, or movement.  Nauseous earlier today - felt like was from not eating. No vomiting. Didn't take insulin dose this am as not eating. Different than rib pain or panic attacks in past.  Does not feel anxious. Chest pain last felt 3 days ago until here in office - about 3 out of 10 in nature.    Unknown if activity caused or changed this pain.  No recent travel or calf swelling/pain.  DM2 - home cbg's 120-130. Lowest - 88 about a month ago.   Gastric sleeve procedure February this year.   Last stress test about 3 years ago - told was normal.    No recent ASA.  No known early fh heart disease known - but unknown paternal FH.  Review of Systems  Constitutional: Negative for fever and chills.  Respiratory: Negative for shortness of breath.   Cardiovascular: Positive for chest pain. Negative for palpitations and leg swelling.       Objective:   Physical Exam  Constitutional: She is oriented to person, place, and time. She appears well-developed and well-nourished.  HENT:  Head: Normocephalic and atraumatic.  Eyes: Conjunctivae and EOM are normal. Pupils are equal, round, and reactive to light.  Neck: Carotid bruit is not present.  Cardiovascular: Normal rate, regular rhythm, normal heart sounds and intact distal pulses.   Pulmonary/Chest: Effort normal and breath sounds normal.       Unable to reproduce pain with palpation, or with neck or  shoulder rom.   Abdominal: Soft. She exhibits no pulsatile midline mass. There is no tenderness.  Musculoskeletal:       Left shoulder: She exhibits normal range of motion, no tenderness and no bony tenderness.       Cervical back: She exhibits normal range of motion and no tenderness.  Neurological: She is alert and oriented to person, place, and time.  Skin: Skin is warm and dry.  Psychiatric: She has a normal mood and affect. Her behavior is normal.    Results for orders placed in visit on 11/28/11  GLUCOSE, POCT (MANUAL RESULT ENTRY)      Component Value Range   POC Glucose 185 (*) 70 - 99 mg/dl       Assessment & Plan:  Ann Lopez is a 41 y.o. female 1. Chest pain  Electrocardiogram report  2. Arm pain    3. HTN (hypertension)    4. DM type 2 (diabetes mellitus, type 2)    5. Dyslipidemia     Midsternal and left sided chest pain for 1 week, with L neck and arm radiation since yesterday.This type of pain not experienced in past, and unable to reproduce with movement.  Risk factors for CAD include htn, dm, and hyperlipidemia.  Hx of mastectomy for breast cancer.  ASA 324mg  chewable given, and monitor placed 1535.  EM[facility]. IV placed L AC.  EMS assumed care at 1550.  Charge nurse advised.

## 2011-11-28 NOTE — ED Notes (Addendum)
Went to PCP this am for c/o pain to upper substernal cp x1 week - interm - now with rad into left neck, shoulder and scapula areas since last PM; denies any associated symptoms; denies any c/o pain at this time; has been given ASA 324 mg PO PTA as well as NTG x2 SL; states had 3/10 "pressure" prior to NTG - now relieved; presently c/o 5/10 posterior h/a rad into left temporal-parietal area; ccm showing SR rate 66 without ectopy

## 2011-11-28 NOTE — ED Notes (Signed)
Per ems- CP x 1 week, intermittent. Today developed radiation to neck and arm, went to PCP for evaluation. CP 3/10. Currently pain free. 324 aspirin given, 2 nitro. Denying any sob. Nausea this morning, none at this time. 20 L. AC. EKG unremarkable, no EKG changes. A x 4.

## 2011-11-28 NOTE — ED Provider Notes (Signed)
History     CSN: [id number]  Arrival date & time 11/28/11  1621   First MD Initiated Contact with Patient 11/28/11 1624      Chief Complaint  Patient presents with  . Chest Pain    (Consider location/radiation/quality/duration/timing/severity/associated sxs/prior treatment) HPI Comments: RASHAY BARNETTE 41 y.o. female   The chief complaint is: Patient presents with:   Chest Pain  Past medical history of diabetes and breast cancer    Patient presents today with chief complaint of left neck and shoulder pain that radiates down the posterior side of her arm. She states that for the past week she has had intermittent dull left-sided chest pain with pressure, not associated with activity. Episode of chest pain would last anywhere from 10-30 minutes. She has had no chest pain for the past day and a half. She denies any associated nausea vomiting or diaphoresis. Last night at approximately 8:00 she began having sharp left neck and shoulder pain that radiated down the posterior side of her left arm. It was most intense at the beginning for approximately 20 minutes.Severity Resolved when she laid down. Pain has gradually gotten better, but she still has left shoulder discomfort.  she had no associated chest pain. The patient went in [facility] and was referred to come to the emergency department today via ambulance. She denies any shortness of breath or dyspnea on exertion. She denies any previous cardiac history. She denies any fevers, chills, nausea, vomiting, abdominal pain, diarrhea.  Patient is a 41 y.o. female presenting with chest pain. The history is provided by the patient, medical records and the EMS personnel.  Chest Pain Pertinent negatives for primary symptoms include no fever, no palpitations, no abdominal pain, no nausea, no vomiting and no dizziness.  Pertinent negatives for associated symptoms include no numbness and no weakness.      Past Medical History  Diagnosis Date  . Diabetes mellitus   . Cancer   . Back pain   . Breast cancer   . Osteopenia     Past Surgical History  Procedure Date  . Mastectomy   . Abdominal hysterectomy   . Lymphadenectomy   . Cesarean section     No family history on file.  History  Substance Use Topics  . Smoking status: Former Games developer  . Smokeless tobacco: Not on file  . Alcohol Use: No    OB History    Grav Para Term Preterm Abortions TAB SAB Ect Mult Living                  Review of Systems  Constitutional: Negative for fever and chills.  HENT: Negative for hearing loss and neck stiffness.   Eyes: Negative for visual disturbance.  Cardiovascular: Negative for chest pain and palpitations.  Gastrointestinal: Negative for nausea, vomiting, abdominal pain, diarrhea and constipation.  Genitourinary: Negative for dysuria.  Musculoskeletal: Negative for back pain and gait problem.  Neurological: Negative for dizziness, tremors, weakness, numbness and headaches.  All other systems reviewed and are negative.    Allergies  Azithromycin; Morphine and related; Nitrofurantoin; Penicillins; and Sulfonamide derivatives  Home Medications   Current Outpatient Rx  Name Route Sig Dispense Refill  . ALBUTEROL SULFATE HFA 108 (90 BASE) MCG/ACT IN AERS Inhalation Inhale 1 puff into the lungs every 6 (six) hours as needed for wheezing. 1 Inhaler 0  . ALPRAZOLAM 1 MG PO TABS Oral Take 1 mg by mouth at bedtime  as needed. For insomnia    . CLONAZEPAM 1 MG PO TABS Oral Take 1 mg by mouth 2 (two) times daily as needed. For anxiety    . DESVENLAFAXINE SUCCINATE ER 100 MG PO TB24 Oral Take 1 tablet (100 mg total) by mouth daily. 90 tablet 2  . FLUTICASONE PROPIONATE 50 MCG/ACT NA SUSP Nasal Place 2 sprays into the nose daily. 1 g 6  . HYDROCODONE-ACETAMINOPHEN 7.5-500 MG PO TABS Oral Take 1 tablet by mouth every 6 (six) hours as needed. For pain    . INSULIN GLARGINE 100 UNIT/ML  [place] SOLN Subcutaneous Inject 20 Units into the skin 2 (two) times daily.     Marland Kitchen METFORMIN HCL 1000 MG PO TABS Oral Take 1,000 mg by mouth 2 (two) times daily with a meal.      . OMEPRAZOLE 20 MG PO CPDR Oral Take 1 capsule (20 mg total) by mouth daily. 90 capsule 2  . ZOMETA IV Intravenous Inject into the vein. Every 6 months    . HYDROCODONE-ACETAMINOPHEN 7.5-500 MG PO TABS Oral Take 1 tablet by mouth every 12 (twelve) hours as needed for pain. 60 tablet 3    BP 115/62  Pulse 79  Temp 97.2 F (36.2 C)  Resp 16  SpO2 96%  Physical Exam  Constitutional: She is oriented to person, place, and time. She appears well-developed and well-nourished. No distress.  HENT:  Head: Normocephalic and atraumatic.  Eyes: Conjunctivae are normal. No scleral icterus.  Neck: Normal range of motion.  Cardiovascular: Normal rate, regular rhythm and normal heart sounds.  Exam reveals no gallop and no friction rub.   No murmur heard. Pulmonary/Chest: Effort normal and breath sounds normal. No respiratory distress.       Bilateral mastectomy, no reproducible chest wall pain.  Abdominal: Soft. Bowel sounds are normal. She exhibits no distension and no mass. There is no tenderness. There is no guarding.  Neurological: She is alert and oriented to person, place, and time.  Skin: Skin is warm and dry. She is not diaphoretic.  Psychiatric: Her behavior is normal.    ED Course  Procedures (including critical care time) Results for orders placed during the hospital encounter of 11/28/11  CBC WITH DIFFERENTIAL      Component Value Range   WBC 15.7 (*) 4.0 - 10.5 K/uL   RBC 4.89  3.87 - 5.11 MIL/uL   Hemoglobin 13.7  12.0 - 15.0 g/dL   HCT 46.9  62.9 - 52.8 %   MCV 82.4  78.0 - 100.0 fL   MCH 28.0  26.0 - 34.0 pg   MCHC 34.0  30.0 - 36.0 g/dL   RDW 41.3  24.4 - 01.0 %   Platelets 238  150 - 400 K/uL   Neutrophils Relative 72  43 - 77 %   Neutro Abs 11.3 (*) 1.7 - 7.7 K/uL   Lymphocytes Relative 22  12 - 46  %   Lymphs Abs 3.5  0.7 - 4.0 K/uL   Monocytes Relative 5  3 - 12 %   Monocytes Absolute 0.7  0.1 - 1.0 K/uL   Eosinophils Relative 1  0 - 5 %   Eosinophils Absolute 0.1  0.0 - 0.7 K/uL   Basophils Relative 0  0 - 1 %   Basophils Absolute 0.1  0.0 - 0.1 K/uL  BASIC METABOLIC PANEL      Component Value Range   Sodium 139  135 - 145 mEq/L   Potassium 4.0  3.5 -  5.1 mEq/L   Chloride 101  96 - 112 mEq/L   CO2 27  19 - 32 mEq/L   Glucose, Bld 139 (*) 70 - 99 mg/dL   BUN 10  6 - 23 mg/dL   Creatinine, Ser 8.29  0.50 - 1.10 mg/dL   Calcium 9.9  8.4 - 56.2 mg/dL   GFR calc non Af Amer >90  >90 mL/min   GFR calc Af Amer >90  >90 mL/min  TROPONIN I      Component Value Range   Troponin I <0.30  <0.30 ng/mL   Results for orders placed during the hospital encounter of 11/28/11  CBC WITH DIFFERENTIAL      Component Value Range   WBC 15.7 (*) 4.0 - 10.5 K/uL   RBC 4.89  3.87 - 5.11 MIL/uL   Hemoglobin 13.7  12.0 - 15.0 g/dL   HCT 13.0  86.5 - 78.4 %   MCV 82.4  78.0 - 100.0 fL   MCH 28.0  26.0 - 34.0 pg   MCHC 34.0  30.0 - 36.0 g/dL   RDW 69.6  29.5 - 28.4 %   Platelets 238  150 - 400 K/uL   Neutrophils Relative 72  43 - 77 %   Neutro Abs 11.3 (*) 1.7 - 7.7 K/uL   Lymphocytes Relative 22  12 - 46 %   Lymphs Abs 3.5  0.7 - 4.0 K/uL   Monocytes Relative 5  3 - 12 %   Monocytes Absolute 0.7  0.1 - 1.0 K/uL   Eosinophils Relative 1  0 - 5 %   Eosinophils Absolute 0.1  0.0 - 0.7 K/uL   Basophils Relative 0  0 - 1 %   Basophils Absolute 0.1  0.0 - 0.1 K/uL  BASIC METABOLIC PANEL      Component Value Range   Sodium 139  135 - 145 mEq/L   Potassium 4.0  3.5 - 5.1 mEq/L   Chloride 101  96 - 112 mEq/L   CO2 27  19 - 32 mEq/L   Glucose, Bld 139 (*) 70 - 99 mg/dL   BUN 10  6 - 23 mg/dL   Creatinine, Ser 1.32  0.50 - 1.10 mg/dL   Calcium 9.9  8.4 - 44.0 mg/dL   GFR calc non Af Amer >90  >90 mL/min   GFR calc Af Amer >90  >90 mL/min  TROPONIN I      Component Value Range   Troponin I  <0.30  <0.30 ng/mL      Date: 11/28/2011  Rate:63  Rhythm: normal sinus rhythm  QRS Axis: normal  Intervals: normal  ST/T Wave abnormalities: normal  Conduction Disutrbances: none  Narrative Interpretation: normal EKG  Old EKG Reviewed: none available  5:06 PM BP 115/62  Pulse 79  Temp 97.2 F (36.2 C)  Resp 16  SpO2 96% Patient is resting comfortably without pain right now. EKG normal. I do not believe that she has pulmonary etiology for her pain or chest wall pain. Neurologic exam is grossly intact. Awaiting labs.  6:43 PM BP 123/51  Pulse 69  Temp 98.1 F (36.7 C) (Oral)  Resp 18  SpO2 97% Patient's labs are unremarkable. Her cardiac troponin level is negative almost 12 hours after onset of symptoms appeared. Dr. spinal examined the patient and agrees that she is safe for discharge. Chest x-ray is negative. Vital signs are stable. Elevated white count is likely in response to stress. Discussed with the patient and all questioned  fully answered.  Dg Chest 2 View  11/28/2011  *RADIOLOGY REPORT*  Clinical Data: Chest pain radiating down the left arm  CHEST - 2 VIEW  Comparison: 12/30/2010  Findings: Cardiomediastinal silhouette is within normal limits. The lungs are clear. No pleural effusion.  No pneumothorax.  No acute osseous abnormality.  IMPRESSION: Normal chest.   Original Report Authenticated By: Harrel Lemon, M.D.      1. Left shoulder pain   2. Neck pain on left side   3. Chest pain       MDM  Patient is safe to discharge with outpatient cardiology follow up.  Discussed reasons to seek immediate care. Patient expresses understanding and agrees with plan.        Arthor Captain, PA-C 11/28/11 1850

## 2011-12-03 ENCOUNTER — Other Ambulatory Visit (HOSPITAL_COMMUNITY): Payer: Self-pay | Admitting: Cardiology

## 2011-12-03 DIAGNOSIS — R079 Chest pain, unspecified: Secondary | ICD-10-CM

## 2011-12-03 NOTE — ED Provider Notes (Signed)
Medical screening examination/treatment/procedure(s) were performed by non-physician practitioner and as supervising physician I was immediately available for consultation/collaboration.   Suzi Roots, MD 12/03/11 [phone]

## 2011-12-14 ENCOUNTER — Ambulatory Visit (HOSPITAL_COMMUNITY)
Admission: RE | Admit: 2011-12-14 | Discharge: 2011-12-14 | Disposition: A | Discharge status: Home / Self Care | Payer: Medicare Other | Source: Ambulatory Visit | Attending: Cardiology | Admitting: Cardiology

## 2011-12-14 ENCOUNTER — Other Ambulatory Visit: Payer: Self-pay

## 2011-12-14 DIAGNOSIS — R079 Chest pain, unspecified: Secondary | ICD-10-CM

## 2011-12-14 MED ORDER — TECHNETIUM TC 99M TETROFOSMIN IV KIT
30.0000 | PACK | Freq: Once | INTRAVENOUS | Status: AC | PRN
  Administered 2011-12-14: 30 via INTRAVENOUS

## 2011-12-14 MED ORDER — TECHNETIUM TC 99M TETROFOSMIN IV KIT: 10.0000 | PACK | Freq: Once | INTRAVENOUS | Status: DC | PRN

## 2011-12-14 MED ORDER — TECHNETIUM TC 99M TETROFOSMIN IV KIT
10.0000 | PACK | Freq: Once | INTRAVENOUS | Status: AC | PRN
  Administered 2011-12-14: 10 via INTRAVENOUS

## 2011-12-14 MED ORDER — REGADENOSON 0.4 MG/5ML IV SOLN
INTRAVENOUS | Status: AC
  Administered 2011-12-14: 0.4 mg via INTRAVENOUS
  Filled 2011-12-14: qty 5

## 2011-12-14 MED ORDER — REGADENOSON 0.4 MG/5ML IV SOLN
0.4000 mg | Freq: Once | INTRAVENOUS | Status: AC
  Administered 2011-12-14: 0.4 mg via INTRAVENOUS

## 2011-12-25 ENCOUNTER — Encounter (HOSPITAL_COMMUNITY): Payer: Self-pay | Admitting: Emergency Medicine

## 2011-12-25 ENCOUNTER — Emergency Department (HOSPITAL_COMMUNITY)
Admission: EM | Admit: 2011-12-25 | Discharge: 2011-12-25 | Disposition: A | Discharge status: Home / Self Care | Payer: Medicare Other | Attending: Emergency Medicine | Admitting: Emergency Medicine

## 2011-12-25 DIAGNOSIS — Z79899 Other long term (current) drug therapy: Secondary | ICD-10-CM | POA: Insufficient documentation

## 2011-12-25 DIAGNOSIS — Z87891 Personal history of nicotine dependence: Secondary | ICD-10-CM | POA: Insufficient documentation

## 2011-12-25 DIAGNOSIS — Z853 Personal history of malignant neoplasm of breast: Secondary | ICD-10-CM | POA: Insufficient documentation

## 2011-12-25 DIAGNOSIS — I889 Nonspecific lymphadenitis, unspecified: Secondary | ICD-10-CM

## 2011-12-25 DIAGNOSIS — M899 Disorder of bone, unspecified: Secondary | ICD-10-CM | POA: Insufficient documentation

## 2011-12-25 DIAGNOSIS — Z859 Personal history of malignant neoplasm, unspecified: Secondary | ICD-10-CM | POA: Insufficient documentation

## 2011-12-25 DIAGNOSIS — E119 Type 2 diabetes mellitus without complications: Secondary | ICD-10-CM | POA: Insufficient documentation

## 2011-12-25 DIAGNOSIS — Z794 Long term (current) use of insulin: Secondary | ICD-10-CM | POA: Insufficient documentation

## 2011-12-25 MED ORDER — NAPROXEN 500 MG PO TABS: 500.0000 mg | ORAL_TABLET | Freq: Two times a day (BID) | ORAL | Status: DC

## 2011-12-25 MED ORDER — CLINDAMYCIN HCL 150 MG PO CAPS: 300.0000 mg | ORAL_CAPSULE | Freq: Three times a day (TID) | ORAL | Status: DC

## 2011-12-25 MED ORDER — NAPROXEN 250 MG PO TABS
500.0000 mg | ORAL_TABLET | Freq: Once | ORAL | Status: AC
  Administered 2011-12-25: 500 mg via ORAL
  Filled 2011-12-25: qty 2

## 2011-12-25 NOTE — ED Notes (Signed)
Pt complains of pain on the right side of her neck/tonsil area x3 days, states the muscle feels sore and it is tender to touch. Denies injury, states no radiation of the pain. Sates some general malaise over the past 3 days as well.

## 2011-12-25 NOTE — ED Provider Notes (Signed)
History     CSN: [id number]  Arrival date & time 12/25/11  0015   First MD Initiated Contact with Patient 12/25/11 0203      Chief Complaint  Patient presents with  . Neck Pain    (Consider location/radiation/quality/duration/timing/severity/associated sxs/prior treatment) HPI Comments: 41 year old female who states that she has had right submandibular swelling for approximately 3 days. It was gradual in onset, persistent, gradually worsening and not associated with fevers chills nausea vomiting. She has mild pain with swallowing but no difficulty opening her mouth or turning her head from side to side. She denies any toothaches, denies ear pain, denies changes in vision or rashes.  Patient is a 41 y.o. female presenting with neck pain. The history is provided by the patient.  Neck Pain     Past Medical History  Diagnosis Date  . Diabetes mellitus   . Cancer   . Back pain   . Breast cancer   . Osteopenia     Past Surgical History  Procedure Date  . Mastectomy   . Abdominal hysterectomy   . Lymphadenectomy   . Cesarean section     History reviewed. No pertinent family history.  History  Substance Use Topics  . Smoking status: Former Games developer  . Smokeless tobacco: Not on file  . Alcohol Use: No    OB History    Grav Para Term Preterm Abortions TAB SAB Ect Mult Living                  Review of Systems  Constitutional: Negative for fever.  HENT: Positive for neck pain. Negative for sore throat and dental problem.   Respiratory: Negative for cough and shortness of breath.     Allergies  Azithromycin; Morphine and related; Nitrofurantoin; Penicillins; and Sulfonamide derivatives  Home Medications   Current Outpatient Rx  Name Route Sig Dispense Refill  . ALBUTEROL SULFATE HFA 108 (90 BASE) MCG/ACT IN AERS Inhalation Inhale 1 puff into the lungs every 6 (six) hours as needed for wheezing. 1 Inhaler 0  . ALPRAZOLAM 1 MG PO TABS Oral Take 1 mg by mouth at  bedtime as needed. For insomnia    . CLONAZEPAM 1 MG PO TABS Oral Take 1 mg by mouth 2 (two) times daily as needed. For anxiety    . DESVENLAFAXINE SUCCINATE ER 100 MG PO TB24 Oral Take 1 tablet (100 mg total) by mouth daily. 90 tablet 2  . HYDROCODONE-ACETAMINOPHEN 7.5-500 MG PO TABS Oral Take 1 tablet by mouth every 6 (six) hours as needed. For pain    . INSULIN GLARGINE 100 UNIT/ML [place] SOLN Subcutaneous Inject 20 Units into the skin 2 (two) times daily.     Marland Kitchen METFORMIN HCL 1000 MG PO TABS Oral Take 1,000 mg by mouth 2 (two) times daily with a meal.      . OMEPRAZOLE 20 MG PO CPDR Oral Take 1 capsule (20 mg total) by mouth daily. 90 capsule 2  . CLINDAMYCIN HCL 150 MG PO CAPS Oral Take 2 capsules (300 mg total) by mouth 3 (three) times daily. May dispense as 150mg  capsules 60 capsule 0  . FLUTICASONE PROPIONATE 50 MCG/ACT NA SUSP Nasal Place 2 sprays into the nose daily. 1 g 6  . HYDROCODONE-ACETAMINOPHEN 7.5-500 MG PO TABS Oral Take 1 tablet by mouth every 12 (twelve) hours as needed for pain. 60 tablet 3  . NAPROXEN 500 MG PO TABS Oral Take 1 tablet (500 mg total) by mouth 2 (two) times daily  with a meal. 30 tablet 0  . ZOMETA IV Intravenous Inject into the vein. Every 6 months      BP 136/70  Pulse 67  Temp 98.2 F (36.8 C) (Oral)  Resp 18  Ht 5\' 3"  (1.6 m)  Wt 195 lb (88.451 kg)  BMI 34.54 kg/m2  SpO2 99%  Physical Exam  Nursing note and vitals reviewed. Constitutional: She appears well-developed and well-nourished. No distress.  HENT:  Head: Normocephalic and atraumatic.  Mouth/Throat: Oropharynx is clear and moist. No oropharyngeal exudate.       Mild dental disease, no tenderness over thesoft palate, tonsillar pillar on the right, cheek, sublingual area.  Eyes: Conjunctivae normal are normal. No scleral icterus.  Neck: Normal range of motion. Neck supple. No thyromegaly present.  Cardiovascular: Normal rate and regular rhythm.   Pulmonary/Chest: Effort normal and breath  sounds normal.  Lymphadenopathy:    She has cervical adenopathy ( isolated cervical adenopathy, less than 1 cm, mobile, rubbery, mildly tender, nonerythematous.).  Neurological: She is alert.  Skin: Skin is warm and dry. No rash noted. She is not diaphoretic.    ED Course  Procedures (including critical care time)  Labs Reviewed - No data to display No results found.   1. Lymphadenitis       MDM  The patient has no trismus, no torticollis, normal vital signs, suspect cervical lymphadenitis, patient appears stable for discharge on antibiotics, has penicillin allergy, will treat with clindamycin.        Vida Roller, MD 12/25/11 [phone]

## 2012-01-30 ENCOUNTER — Other Ambulatory Visit: Payer: Self-pay | Admitting: Family Medicine

## 2012-02-03 ENCOUNTER — Other Ambulatory Visit: Payer: Self-pay | Admitting: Family Medicine

## 2012-02-05 ENCOUNTER — Other Ambulatory Visit: Payer: Self-pay | Admitting: Radiology

## 2012-03-07 ENCOUNTER — Other Ambulatory Visit: Payer: Self-pay | Admitting: Physician Assistant

## 2012-03-07 ENCOUNTER — Other Ambulatory Visit: Payer: Self-pay | Admitting: Family Medicine

## 2012-03-07 NOTE — Telephone Encounter (Signed)
Needs office visit.

## 2012-03-08 ENCOUNTER — Other Ambulatory Visit: Payer: Self-pay | Admitting: Family Medicine

## 2012-03-08 ENCOUNTER — Ambulatory Visit (INDEPENDENT_AMBULATORY_CARE_PROVIDER_SITE_OTHER): Payer: Medicare Other | Admitting: Family Medicine

## 2012-03-08 VITALS — BP 118/74 | HR 88 | Temp 98.1°F | Resp 16 | Ht 62.0 in | Wt 201.0 lb

## 2012-03-08 DIAGNOSIS — J45909 Unspecified asthma, uncomplicated: Secondary | ICD-10-CM

## 2012-03-08 DIAGNOSIS — E119 Type 2 diabetes mellitus without complications: Secondary | ICD-10-CM

## 2012-03-08 DIAGNOSIS — M549 Dorsalgia, unspecified: Secondary | ICD-10-CM

## 2012-03-08 DIAGNOSIS — Z9884 Bariatric surgery status: Secondary | ICD-10-CM

## 2012-03-08 DIAGNOSIS — F341 Dysthymic disorder: Secondary | ICD-10-CM

## 2012-03-08 DIAGNOSIS — L659 Nonscarring hair loss, unspecified: Secondary | ICD-10-CM

## 2012-03-08 DIAGNOSIS — R413 Other amnesia: Secondary | ICD-10-CM

## 2012-03-08 DIAGNOSIS — F410 Panic disorder [episodic paroxysmal anxiety] without agoraphobia: Secondary | ICD-10-CM

## 2012-03-08 LAB — GLUCOSE, POCT (MANUAL RESULT ENTRY): POC Glucose: 124 mg/dl — AB (ref 70–99)

## 2012-03-08 LAB — POCT GLYCOSYLATED HEMOGLOBIN (HGB A1C): Hemoglobin A1C: 7.8

## 2012-03-08 MED ORDER — CLONAZEPAM 1 MG PO TABS: 1.0000 mg | ORAL_TABLET | Freq: Two times a day (BID) | ORAL | Status: DC | PRN

## 2012-03-08 MED ORDER — ALBUTEROL SULFATE HFA 108 (90 BASE) MCG/ACT IN AERS: 1.0000 | INHALATION_SPRAY | Freq: Four times a day (QID) | RESPIRATORY_TRACT | Status: DC | PRN

## 2012-03-08 MED ORDER — ALPRAZOLAM 1 MG PO TABS: 1.0000 mg | ORAL_TABLET | Freq: Every evening | ORAL | Status: DC | PRN

## 2012-03-08 MED ORDER — DESVENLAFAXINE SUCCINATE ER 100 MG PO TB24: 100.0000 mg | ORAL_TABLET | Freq: Every day | ORAL | Status: DC

## 2012-03-08 MED ORDER — HYDROCODONE-ACETAMINOPHEN 7.5-500 MG PO TABS: 1.0000 | ORAL_TABLET | Freq: Four times a day (QID) | ORAL | Status: DC | PRN

## 2012-03-08 MED ORDER — FLUTICASONE PROPIONATE 50 MCG/ACT NA SUSP: 2.0000 | Freq: Every day | NASAL | Status: DC

## 2012-03-08 MED ORDER — INSULIN GLARGINE 100 UNIT/ML ~~LOC~~ SOLN: 20.0000 [IU] | Freq: Two times a day (BID) | SUBCUTANEOUS | Status: DC

## 2012-03-08 MED ORDER — METFORMIN HCL 1000 MG PO TABS: 1000.0000 mg | ORAL_TABLET | Freq: Two times a day (BID) | ORAL | Status: DC

## 2012-03-08 NOTE — Progress Notes (Signed)
@[email or web address] @  Patient ID: Ann Lopez MRN: [id number], DOB: 03-26-1971, 41 y.o. Date of Encounter: 03/08/2012, 4:28 PM  Primary Physician: Elvina Sidle, MD  Chief Complaint: Diabetes follow up  HPI: 41 y.o. year old female with history below presents for follow up of diabetes mellitus. Doing well. No issues or complaints. Taking medications daily without adverse effects. No polydipsia, polyphagia, polyuria, or nocturia.  Blood sugars at home: 1100-140 Onset of diabetes:  2007 when she contracted the breast ca  She notes hair loss of the last several months.  Exercising regularly.  At the gym Last A1C:  6.5 (June 2013) Burning in feet has resolved since the gastric sleeve. Eye MD:  Due in December DDS:  Every six months (next Feb)  Influenza vaccine: 2013 Pneumococcal vaccine: 2012   Past Medical History  Diagnosis Date  . Diabetes mellitus   . Back pain   . Osteopenia   . Allergy   . Asthma   . Cancer   . Breast cancer   . Depression   . Anxiety   . Osteoporosis     osteopenia     Home Meds: Prior to Admission medications   Medication Sig Start Date End Date Taking? Authorizing Provider  albuterol (PROVENTIL HFA;VENTOLIN HFA) 108 (90 BASE) MCG/ACT inhaler Inhale 1 puff into the lungs every 6 (six) hours as needed for wheezing. 09/02/11 09/01/12 Yes Dois Davenport, MD  ALPRAZolam Prudy Feeler) 1 MG tablet TAKE ONE TABLET BY MOUTH AT BEDTIME AS NEEDED 03/07/12  Yes Ryan M Dunn, PA-C  clonazePAM (KLONOPIN) 1 MG tablet TAKE ONE TABLET BY MOUTH TWICE DAILY AS NEEDED 03/07/12  Yes Ryan M Dunn, PA-C  desvenlafaxine (PRISTIQ) 100 MG 24 hr tablet Take 1 tablet (100 mg total) by mouth daily. 08/20/11  Yes Dois Davenport, MD  fluticasone (FLONASE) 50 MCG/ACT nasal spray Place 2 sprays into the nose daily. 09/02/11 09/01/12 Yes Dois Davenport, MD  HYDROcodone-acetaminophen (LORTAB) 7.5-500 MG per tablet Take 1 tablet by mouth every 6 (six) hours as needed. For pain   Yes Historical  Provider, MD  insulin glargine (LANTUS) 100 UNIT/ML injection Inject 20 Units into the skin 2 (two) times daily.    Yes Historical Provider, MD  metFORMIN (GLUCOPHAGE) 1000 MG tablet Take 1,000 mg by mouth 2 (two) times daily with a meal.     Yes Historical Provider, MD  Multiple Vitamin (MULTIVITAMIN) tablet Take 1 tablet by mouth daily.   Yes Historical Provider, MD  omeprazole (PRILOSEC) 20 MG capsule Take 1 capsule (20 mg total) by mouth daily. 08/20/11  Yes Dois Davenport, MD  ONE TOUCH ULTRA TEST test strip USE AS DIRECTED 01/30/12  Yes Morrell Riddle, PA-C  Zoledronic Acid (ZOMETA IV) Inject into the vein. Every 6 months   Yes Historical Provider, MD  clindamycin (CLEOCIN) 150 MG capsule Take 2 capsules (300 mg total) by mouth 3 (three) times daily. May dispense as 150mg  capsules 12/25/11   Vida Roller, MD  HYDROcodone-acetaminophen (LORTAB 7.5) 7.5-500 MG per tablet Take 1 tablet by mouth every 12 (twelve) hours as needed for pain. 11/14/11 11/24/11  Elvina Sidle, MD  naproxen (NAPROSYN) 500 MG tablet Take 1 tablet (500 mg total) by mouth 2 (two) times daily with a meal. 12/25/11   Vida Roller, MD    Allergies:  Allergies  Allergen Reactions  . Azithromycin     Unknown  . Morphine And Related     Can tolerate hydrocodone and dilaudid without side effects  .  Nitrofurantoin     Unknown  . Penicillins     Unknown  . Sulfonamide Derivatives     Unknown    History   Social History  . Marital Status: Single    Spouse Name: N/A    Number of Children: N/A  . Years of Education: N/A   Occupational History  . Not on file.   Social History Main Topics  . Smoking status: Current Some Day Smoker    Types: Cigarettes  . Smokeless tobacco: Not on file  . Alcohol Use: No  . Drug Use: No  . Sexually Active: Not on file   Other Topics Concern  . Not on file   Social History Narrative  . No narrative on file     Review of Systems:  Constitutional: negative for chills,  fever, night sweats, weight changes, or fatigue  HEENT: negative for vision changes, hearing loss, congestion, rhinorrhea, or epistaxis Cardiovascular: negative for chest pain, palpitations, diaphoresis, DOE, orthopnea, or edema Respiratory: negative for hemoptysis, wheezing, shortness of breath, dyspnea, or cough Abdominal: negative for abdominal pain, nausea, vomiting, diarrhea, or constipation Dermatological: negative for rash, erythema, or wounds Neurologic: negative for headache, dizziness, or syncope Renal:  Negative for polyuria, polydipsia, or dysuria All other systems reviewed and are otherwise negative with the exception to those above and in the HPI.   Physical Exam:  Blood pressure 118/74, pulse 88, temperature 98.1 F (36.7 C), temperature source Oral, resp. rate 16, height 5\' 2"  (1.575 m), weight 201 lb (91.173 kg), SpO2 97.00%., Body mass index is 36.76 kg/(m^2). General: Well developed, well nourished, in no acute distress. Head: Normocephalic, atraumatic, eyes without discharge, sclera non-icteric, nares are without discharge. Bilateral auditory canals clear, TM's are without perforation, pearly grey and translucent with reflective cone of light bilaterally. Oral cavity moist, posterior pharynx without exudate, erythema, peritonsillar abscess, or post nasal drip.  Neck: Supple. No thyromegaly. Full ROM. No lymphadenopathy. Lungs: Clear bilaterally to auscultation without wheezes, rales, or rhonchi. Breathing is unlabored. Heart: RRR with S1 S2. No murmurs, rubs, or gallops appreciated. Abdomen: Soft, non-tender, non-distended with normoactive bowel sounds. No hepatosplenomegaly. No rebound/guarding. No obvious abdominal masses. Msk:  Strength and tone normal for age. Extremities/Skin: Warm and dry. No clubbing or cyanosis. No edema. No rashes, wounds, or suspicious lesions. Monofilament exam unremarkable bilaterally.  Neuro: Alert and oriented X 3. Moves all extremities  spontaneously. Gait is normal. CNII-XII grossly in tact. Psych:  Responds to questions appropriately with a normal affect.     ASSESSMENT AND PLAN:  41 y.o. year old female with diabetes, hair loss, and h/o breast ca.  she is interested in having breast reconstruction but this can happen only after 15 months postop and she is now only 9 months postop. She continues to lose weight with a goal of 175 pounds. She also like to get off of her insulin. -  Signed, Elvina Sidle, MD 03/08/2012 4:28 PM  Results for orders placed in visit on 03/08/12  GLUCOSE, POCT (MANUAL RESULT ENTRY)      Component Value Range   POC Glucose 124 (*) 70 - 99 mg/dl  POCT GLYCOSYLATED HEMOGLOBIN (HGB A1C)      Component Value Range   Hemoglobin A1C 7.8

## 2012-03-09 LAB — VITAMIN B12: Vitamin B-12: 446 pg/mL (ref 211–911)

## 2012-03-09 LAB — COMPREHENSIVE METABOLIC PANEL
ALT: 15 U/L (ref 0–35)
AST: 13 U/L (ref 0–37)
Albumin: 4.8 g/dL (ref 3.5–5.2)
Alkaline Phosphatase: 62 U/L (ref 39–117)
BUN: 5 mg/dL — ABNORMAL LOW (ref 6–23)
CO2: 27 mEq/L (ref 19–32)
Calcium: 9.5 mg/dL (ref 8.4–10.5)
Chloride: 103 mEq/L (ref 96–112)
Creat: 0.58 mg/dL (ref 0.50–1.10)
Glucose, Bld: 129 mg/dL — ABNORMAL HIGH (ref 70–99)
Potassium: 4.1 mEq/L (ref 3.5–5.3)
Sodium: 138 mEq/L (ref 135–145)
Total Bilirubin: 0.5 mg/dL (ref 0.3–1.2)
Total Protein: 7.9 g/dL (ref 6.0–8.3)

## 2012-03-09 LAB — LIPID PANEL
Cholesterol: 140 mg/dL (ref 0–200)
HDL: 27 mg/dL — ABNORMAL LOW (ref >39)
Total CHOL/HDL Ratio: 5.2 Ratio
Triglycerides: 531 mg/dL — ABNORMAL HIGH (ref <150)

## 2012-03-09 LAB — TSH: TSH: 1.174 u[IU]/mL (ref 0.350–4.500)

## 2012-03-10 LAB — VITAMIN D 25 HYDROXY (VIT D DEFICIENCY, FRACTURES): Vit D, 25-Hydroxy: 33 ng/mL (ref 30–89)

## 2012-07-09 ENCOUNTER — Ambulatory Visit (INDEPENDENT_AMBULATORY_CARE_PROVIDER_SITE_OTHER): Payer: Medicare Other | Admitting: Family Medicine

## 2012-07-09 VITALS — BP 110/70 | HR 86 | Temp 98.7°F | Resp 16 | Ht 63.75 in | Wt 210.0 lb

## 2012-07-09 DIAGNOSIS — N760 Acute vaginitis: Secondary | ICD-10-CM

## 2012-07-09 DIAGNOSIS — N898 Other specified noninflammatory disorders of vagina: Secondary | ICD-10-CM

## 2012-07-09 DIAGNOSIS — R300 Dysuria: Secondary | ICD-10-CM

## 2012-07-09 DIAGNOSIS — IMO0001 Reserved for inherently not codable concepts without codable children: Secondary | ICD-10-CM

## 2012-07-09 DIAGNOSIS — N390 Urinary tract infection, site not specified: Secondary | ICD-10-CM

## 2012-07-09 DIAGNOSIS — B9689 Other specified bacterial agents as the cause of diseases classified elsewhere: Secondary | ICD-10-CM

## 2012-07-09 DIAGNOSIS — A499 Bacterial infection, unspecified: Secondary | ICD-10-CM

## 2012-07-09 DIAGNOSIS — E1165 Type 2 diabetes mellitus with hyperglycemia: Secondary | ICD-10-CM

## 2012-07-09 LAB — POCT WET PREP WITH KOH
Clue Cells Wet Prep HPF POC: 100
Trichomonas, UA: NEGATIVE

## 2012-07-09 LAB — POCT URINALYSIS DIPSTICK
Bilirubin, UA: NEGATIVE
Glucose, UA: >=1000
Nitrite, UA: NEGATIVE
Protein, UA: NEGATIVE
Spec Grav, UA: >=1.03
Urobilinogen, UA: 0.2
pH, UA: 5.5

## 2012-07-09 LAB — POCT UA - MICROSCOPIC ONLY
Crystals, Ur, HPF, POC: NEGATIVE
Mucus, UA: NEGATIVE
Yeast, UA: NEGATIVE

## 2012-07-09 MED ORDER — CIPROFLOXACIN HCL 250 MG PO TABS: 250.0000 mg | ORAL_TABLET | Freq: Two times a day (BID) | ORAL | Status: DC

## 2012-07-09 MED ORDER — METRONIDAZOLE 500 MG PO TABS: ORAL_TABLET | ORAL | Status: DC

## 2012-07-09 NOTE — Progress Notes (Signed)
Subjective:    Patient ID: Ann Lopez, female    DOB: 09-05-1970, 42 y.o.   MRN: [id number]  HPI Ann Lopez is a 42 y.o. female PCP: Dr. Milus Glazier. Plans on following up in next few months.    Started about 4 days ago - felt urgency, frequency, and pain/pressure with urinating.   Hx of DM2 - home blood sugars 150-200. Hasn't checked yet today. Last night - 213, but forgot novolog last night. On lantus 20 units twice per day, then novolog sliding scale - forgets this a few times per week. Took novolog this morning. Fruit smoothie this afternoon,and did not take novolog.  last novolog with breakfast.   Vaginal discharge - white discharge, raw/itchy. Usually took diflucan once a week for recurrent vaginal yeast infection, but has had thrush as well. Ran out of diflucan in past 3 months.  Current sx's similar to prior yeast infections, but no yeast infection in years. No new rashes. Has had dry mucosa/skin in vaginal area.   Tx: cranberry juice.   Review of Systems  Constitutional: Negative for fever and chills.  Gastrointestinal: Negative for nausea, vomiting and abdominal pain.       Lower abdomen/bladder area  Genitourinary: Positive for dysuria, urgency, frequency and vaginal discharge. Negative for hematuria, vaginal bleeding, difficulty urinating, vaginal pain, menstrual problem and pelvic pain.  Musculoskeletal: Negative for back pain.  Skin: Negative for rash.      Objective:   Physical Exam  Vitals reviewed. Constitutional: She is oriented to person, place, and time. She appears well-developed and well-nourished.  HENT:  Head: Normocephalic and atraumatic.  Cardiovascular: Normal rate, regular rhythm, normal heart sounds and intact distal pulses.   Pulmonary/Chest: Effort normal and breath sounds normal.  Abdominal: Soft. Normal appearance. She exhibits no distension. There is no tenderness. There is no rebound, no guarding and no CVA tenderness.  Neurological: She  is alert and oriented to person, place, and time.  Skin: Skin is warm.  Psychiatric: She has a normal mood and affect. Her behavior is normal.      Results for orders placed in visit on 07/09/12  POCT URINALYSIS DIPSTICK      Result Value Range   Color, UA yellow     Clarity, UA cloudy     Glucose, UA >=1000     Bilirubin, UA neg     Ketones, UA trace     Spec Grav, UA >=1.030     Blood, UA trace-lysed     pH, UA 5.5     Protein, UA neg     Urobilinogen, UA 0.2     Nitrite, UA neg     Leukocytes, UA moderate (2+)    POCT UA - MICROSCOPIC ONLY      Result Value Range   WBC, Ur, HPF, POC tntc     RBC, urine, microscopic 0-1     Bacteria, U Microscopic 2+     Mucus, UA neg     Epithelial cells, urine per micros 10-15     Crystals, Ur, HPF, POC neg     Casts, Ur, LPF, POC renal tubular     Yeast, UA neg       Results for orders placed in visit on 07/09/12  POCT URINALYSIS DIPSTICK      Result Value Range   Color, UA yellow     Clarity, UA cloudy     Glucose, UA >=1000     Bilirubin, UA neg  Ketones, UA trace     Spec Grav, UA >=1.030     Blood, UA trace-lysed     pH, UA 5.5     Protein, UA neg     Urobilinogen, UA 0.2     Nitrite, UA neg     Leukocytes, UA moderate (2+)    POCT UA - MICROSCOPIC ONLY      Result Value Range   WBC, Ur, HPF, POC tntc     RBC, urine, microscopic 0-1     Bacteria, U Microscopic 2+     Mucus, UA neg     Epithelial cells, urine per micros 10-15     Crystals, Ur, HPF, POC neg     Casts, Ur, LPF, POC renal tubular     Yeast, UA neg    GLUCOSE, POCT (MANUAL RESULT ENTRY)      Result Value Range   POC Glucose 406 (*) 70 - 99 mg/dl  POCT GLYCOSYLATED HEMOGLOBIN (HGB A1C)      Result Value Range   Hemoglobin A1C 9.5    POCT WET PREP WITH KOH      Result Value Range   Trichomonas, UA Negative     Clue Cells Wet Prep HPF POC 100%     Epithelial Wet Prep HPF POC 5-10     Yeast Wet Prep HPF POC neg     Bacteria Wet Prep HPF POC  4+     RBC Wet Prep HPF POC neg     WBC Wet Prep HPF POC 1-6     KOH Prep POC Negative        Assessment & Plan:  Ann Lopez is a 42 y.o. female Dysuria - Plan: POCT urinalysis dipstick, POCT UA - Microscopic Only, Urine culture  Diabetes type 2, uncontrolled - Plan: POCT glucose (manual entry), POCT glycosylated hemoglobin (Hb A1C)  Vaginal discharge - Plan: POCT Wet Prep with KOH  UTI (urinary tract infection) - Plan: ciprofloxacin (CIPRO) 250 MG tablet  Bacterial vaginosis - Plan: metroNIDAZOLE (FLAGYL) 500 MG tablet   Diabetes - uncontrolled. Has had some missed novolog doses/nonadherence. Stressed adherence to regimen, check home blood sugars 3 times per day and keep a record of these readings and amount of novolog used and recheck in next 2 weeks to discuss medication regimen. Er precations overnight discussed.   UTI - complicated by uncontrolled dm2. Allergies noted.  Will start cipro 250mg  BID #14.  Check urine culture.   Bacterial vaginosis, with atrophic vagnitis by hx? Hx of candidal vaginitis, but not noted today.  vagisil if needed to dry areas and rtc to discuss further with PCP. Flagyl 500mg  BID in 1 week (after cipro).  rtc precautions.    Meds ordered this encounter  Medications  . insulin aspart (NOVOLOG) 100 UNIT/ML injection    Sig: Inject into the skin 3 (three) times daily before meals.  . ciprofloxacin (CIPRO) 250 MG tablet    Sig: Take 1 tablet (250 mg total) by mouth 2 (two) times daily.    Dispense:  14 tablet    Refill:  0  . metroNIDAZOLE (FLAGYL) 500 MG tablet    Sig: 1 pill by mouth twice per day.  Avoid any alcohol while taking this medicine.    Dispense:  14 tablet    Refill:  0   Patient Instructions  Start the cipro for the urinary infection, drink plenty of water/fluids. Check your blood sugars 3 times per day, and keep a record of these  and doses of Novolog given. Recheck in next 2 weeks.  If blood sugars over 350 once you are back on  your usual insulin regimen - follow up sooner. Start the flagyl for bacterial vaginosis in 1 week.  No alcohol at all while taking this medicine. Return to the clinic or go to the nearest emergency room if any of your symptoms worsen or new symptoms occur.   Bacterial Vaginosis Bacterial vaginosis (BV) is a vaginal infection where the normal balance of bacteria in the vagina is disrupted. The normal balance is then replaced by an overgrowth of certain bacteria. There are several different kinds of bacteria that can cause BV. BV is the most common vaginal infection in women of childbearing age. CAUSES   The cause of BV is not fully understood. BV develops when there is an increase or imbalance of harmful bacteria.  Some activities or behaviors can upset the normal balance of bacteria in the vagina and put women at increased risk including:  Having a new sex partner or multiple sex partners.  Douching.  Using an intrauterine device (IUD) for contraception.  It is not clear what role sexual activity plays in the development of BV. However, women that have never had sexual intercourse are rarely infected with BV. Women do not get BV from toilet seats, bedding, swimming pools or from touching objects around them.  SYMPTOMS   Grey vaginal discharge.  A fish-like odor with discharge, especially after sexual intercourse.  Itching or burning of the vagina and vulva.  Burning or pain with urination.  Some women have no signs or symptoms at all. DIAGNOSIS  Your caregiver must examine the vagina for signs of BV. Your caregiver will perform lab tests and look at the sample of vaginal fluid through a microscope. They will look for bacteria and abnormal cells (clue cells), a pH test higher than 4.5, and a positive amine test all associated with BV.  RISKS AND COMPLICATIONS   Pelvic inflammatory disease (PID).  Infections following gynecology surgery.  Developing HIV.  Developing herpes  virus. TREATMENT  Sometimes BV will clear up without treatment. However, all women with symptoms of BV should be treated to avoid complications, especially if gynecology surgery is planned. Female partners generally do not need to be treated. However, BV may spread between female sex partners so treatment is helpful in preventing a recurrence of BV.   BV may be treated with antibiotics. The antibiotics come in either pill or vaginal cream forms. Either can be used with nonpregnant or pregnant women, but the recommended dosages differ. These antibiotics are not harmful to the baby.  BV can recur after treatment. If this happens, a second round of antibiotics will often be prescribed.  Treatment is important for pregnant women. If not treated, BV can cause a premature delivery, especially for a pregnant woman who had a premature birth in the past. All pregnant women who have symptoms of BV should be checked and treated.  For chronic reoccurrence of BV, treatment with a type of prescribed gel vaginally twice a week is helpful. HOME CARE INSTRUCTIONS   Finish all medication as directed by your caregiver.  Do not have sex until treatment is completed.  Tell your sexual partner that you have a vaginal infection. They should see their caregiver and be treated if they have problems, such as a mild rash or itching.  Practice safe sex. Use condoms. Only have 1 sex partner. PREVENTION  Basic prevention steps can help reduce  the risk of upsetting the natural balance of bacteria in the vagina and developing BV:  Do not have sexual intercourse (be abstinent).  Do not douche.  Use all of the medicine prescribed for treatment of BV, even if the signs and symptoms go away.  Tell your sex partner if you have BV. That way, they can be treated, if needed, to prevent reoccurrence. SEEK MEDICAL CARE IF:   Your symptoms are not improving after 3 days of treatment.  You have increased discharge, pain, or  fever. MAKE SURE YOU:   Understand these instructions.  Will watch your condition.  Will get help right away if you are not doing well or get worse. FOR MORE INFORMATION  Division of STD Prevention (DSTDP), Centers for Disease Control and Prevention: [employer] American [employer] Health Association (ASHA): www.ashastd.org  Document Released: 03/26/2005 Document Revised: 06/18/2011 Document Reviewed: 09/16/2008 [facility] Patient Information 2013 [place], [place].   Urinary Tract Infection Urinary tract infections (UTIs) can develop anywhere along your urinary tract. Your urinary tract is your body's drainage system for removing wastes and extra water. Your urinary tract includes two kidneys, two ureters, a bladder, and a urethra. Your kidneys are a pair of bean-shaped organs. Each kidney is about the size of your fist. They are located below your ribs, one on each side of your spine. CAUSES Infections are caused by microbes, which are microscopic organisms, including fungi, viruses, and bacteria. These organisms are so small that they can only be seen through a microscope. Bacteria are the microbes that most commonly cause UTIs. SYMPTOMS  Symptoms of UTIs may vary by age and gender of the patient and by the location of the infection. Symptoms in young women typically include a frequent and intense urge to urinate and a painful, burning feeling in the bladder or urethra during urination. Older women and men are more likely to be tired, shaky, and weak and have muscle aches and abdominal pain. A fever may mean the infection is in your kidneys. Other symptoms of a kidney infection include pain in your back or sides below the ribs, nausea, and vomiting. DIAGNOSIS To diagnose a UTI, your caregiver will ask you about your symptoms. Your caregiver also will ask to provide a urine sample. The urine sample will be tested for bacteria and white blood cells. White blood cells are made by your body to help  fight infection. TREATMENT  Typically, UTIs can be treated with medication. Because most UTIs are caused by a bacterial infection, they usually can be treated with the use of antibiotics. The choice of antibiotic and length of treatment depend on your symptoms and the type of bacteria causing your infection. HOME CARE INSTRUCTIONS  If you were prescribed antibiotics, take them exactly as your caregiver instructs you. Finish the medication even if you feel better after you have only taken some of the medication.  Drink enough water and fluids to keep your urine clear or pale yellow.  Avoid caffeine, tea, and carbonated beverages. They tend to irritate your bladder.  Empty your bladder often. Avoid holding urine for long periods of time.  Empty your bladder before and after sexual intercourse.  After a bowel movement, women should cleanse from front to back. Use each tissue only once. SEEK MEDICAL CARE IF:   You have back pain.  You develop a fever.  Your symptoms do not begin to resolve within 3 days. SEEK IMMEDIATE MEDICAL CARE IF:   You have severe back pain or lower abdominal  pain.  You develop chills.  You have nausea or vomiting.  You have continued burning or discomfort with urination. MAKE SURE YOU:   Understand these instructions.  Will watch your condition.  Will get help right away if you are not doing well or get worse. Document Released: 01/03/2005 Document Revised: 09/25/2011 Document Reviewed: 05/04/2011 [facility] Patient Information 2013 [place], [place].    t

## 2012-07-09 NOTE — Patient Instructions (Signed)
Start the cipro for the urinary infection, drink plenty of water/fluids. Check your blood sugars 3 times per day, and keep a record of these and doses of Novolog given. Recheck in next 2 weeks.  If blood sugars over 350 once you are back on your usual insulin regimen - follow up sooner. Start the flagyl for bacterial vaginosis in 1 week.  No alcohol at all while taking this medicine. Return to the clinic or go to the nearest emergency room if any of your symptoms worsen or new symptoms occur.   Bacterial Vaginosis Bacterial vaginosis (BV) is a vaginal infection where the normal balance of bacteria in the vagina is disrupted. The normal balance is then replaced by an overgrowth of certain bacteria. There are several different kinds of bacteria that can cause BV. BV is the most common vaginal infection in women of childbearing age. CAUSES   The cause of BV is not fully understood. BV develops when there is an increase or imbalance of harmful bacteria.  Some activities or behaviors can upset the normal balance of bacteria in the vagina and put women at increased risk including:  Having a new sex partner or multiple sex partners.  Douching.  Using an intrauterine device (IUD) for contraception.  It is not clear what role sexual activity plays in the development of BV. However, women that have never had sexual intercourse are rarely infected with BV. Women do not get BV from toilet seats, bedding, swimming pools or from touching objects around them.  SYMPTOMS   Grey vaginal discharge.  A fish-like odor with discharge, especially after sexual intercourse.  Itching or burning of the vagina and vulva.  Burning or pain with urination.  Some women have no signs or symptoms at all. DIAGNOSIS  Your caregiver must examine the vagina for signs of BV. Your caregiver will perform lab tests and look at the sample of vaginal fluid through a microscope. They will look for bacteria and abnormal cells  (clue cells), a pH test higher than 4.5, and a positive amine test all associated with BV.  RISKS AND COMPLICATIONS   Pelvic inflammatory disease (PID).  Infections following gynecology surgery.  Developing HIV.  Developing herpes virus. TREATMENT  Sometimes BV will clear up without treatment. However, all women with symptoms of BV should be treated to avoid complications, especially if gynecology surgery is planned. Female partners generally do not need to be treated. However, BV may spread between female sex partners so treatment is helpful in preventing a recurrence of BV.   BV may be treated with antibiotics. The antibiotics come in either pill or vaginal cream forms. Either can be used with nonpregnant or pregnant women, but the recommended dosages differ. These antibiotics are not harmful to the baby.  BV can recur after treatment. If this happens, a second round of antibiotics will often be prescribed.  Treatment is important for pregnant women. If not treated, BV can cause a premature delivery, especially for a pregnant woman who had a premature birth in the past. All pregnant women who have symptoms of BV should be checked and treated.  For chronic reoccurrence of BV, treatment with a type of prescribed gel vaginally twice a week is helpful. HOME CARE INSTRUCTIONS   Finish all medication as directed by your caregiver.  Do not have sex until treatment is completed.  Tell your sexual partner that you have a vaginal infection. They should see their caregiver and be treated if they have problems, such as  a mild rash or itching.  Practice safe sex. Use condoms. Only have 1 sex partner. PREVENTION  Basic prevention steps can help reduce the risk of upsetting the natural balance of bacteria in the vagina and developing BV:  Do not have sexual intercourse (be abstinent).  Do not douche.  Use all of the medicine prescribed for treatment of BV, even if the signs and symptoms go  away.  Tell your sex partner if you have BV. That way, they can be treated, if needed, to prevent reoccurrence. SEEK MEDICAL CARE IF:   Your symptoms are not improving after 3 days of treatment.  You have increased discharge, pain, or fever. MAKE SURE YOU:   Understand these instructions.  Will watch your condition.  Will get help right away if you are not doing well or get worse. FOR MORE INFORMATION  Division of STD Prevention (DSTDP), Centers for Disease Control and Prevention: [employer] American Social Health Association (ASHA): www.ashastd.org  Document Released: 03/26/2005 Document Revised: 06/18/2011 Document Reviewed: 09/16/2008 [facility] Patient Information 2013 [place], [place].   Urinary Tract Infection Urinary tract infections (UTIs) can develop anywhere along your urinary tract. Your urinary tract is your body's drainage system for removing wastes and extra water. Your urinary tract includes two kidneys, two ureters, a bladder, and a urethra. Your kidneys are a pair of bean-shaped organs. Each kidney is about the size of your fist. They are located below your ribs, one on each side of your spine. CAUSES Infections are caused by microbes, which are microscopic organisms, including fungi, viruses, and bacteria. These organisms are so small that they can only be seen through a microscope. Bacteria are the microbes that most commonly cause UTIs. SYMPTOMS  Symptoms of UTIs may vary by age and gender of the patient and by the location of the infection. Symptoms in young women typically include a frequent and intense urge to urinate and a painful, burning feeling in the bladder or urethra during urination. Older women and men are more likely to be tired, shaky, and weak and have muscle aches and abdominal pain. A fever may mean the infection is in your kidneys. Other symptoms of a kidney infection include pain in your back or sides below the ribs, nausea, and  vomiting. DIAGNOSIS To diagnose a UTI, your caregiver will ask you about your symptoms. Your caregiver also will ask to provide a urine sample. The urine sample will be tested for bacteria and white blood cells. White blood cells are made by your body to help fight infection. TREATMENT  Typically, UTIs can be treated with medication. Because most UTIs are caused by a bacterial infection, they usually can be treated with the use of antibiotics. The choice of antibiotic and length of treatment depend on your symptoms and the type of bacteria causing your infection. HOME CARE INSTRUCTIONS  If you were prescribed antibiotics, take them exactly as your caregiver instructs you. Finish the medication even if you feel better after you have only taken some of the medication.  Drink enough water and fluids to keep your urine clear or pale yellow.  Avoid caffeine, tea, and carbonated beverages. They tend to irritate your bladder.  Empty your bladder often. Avoid holding urine for long periods of time.  Empty your bladder before and after sexual intercourse.  After a bowel movement, women should cleanse from front to back. Use each tissue only once. SEEK MEDICAL CARE IF:   You have back pain.  You develop a fever.  Your  symptoms do not begin to resolve within 3 days. SEEK IMMEDIATE MEDICAL CARE IF:   You have severe back pain or lower abdominal pain.  You develop chills.  You have nausea or vomiting.  You have continued burning or discomfort with urination. MAKE SURE YOU:   Understand these instructions.  Will watch your condition.  Will get help right away if you are not doing well or get worse. Document Released: 01/03/2005 Document Revised: 09/25/2011 Document Reviewed: 05/04/2011 [facility] Patient Information 2013 [place], [place].

## 2012-07-12 LAB — URINE CULTURE

## 2012-07-22 ENCOUNTER — Other Ambulatory Visit: Payer: Self-pay | Admitting: Family Medicine

## 2012-08-21 ENCOUNTER — Other Ambulatory Visit: Payer: Self-pay

## 2012-08-21 DIAGNOSIS — K219 Gastro-esophageal reflux disease without esophagitis: Secondary | ICD-10-CM

## 2012-08-21 MED ORDER — OMEPRAZOLE 20 MG PO CPDR: 20.0000 mg | DELAYED_RELEASE_CAPSULE | Freq: Every day | ORAL | Status: DC

## 2012-08-28 ENCOUNTER — Ambulatory Visit (INDEPENDENT_AMBULATORY_CARE_PROVIDER_SITE_OTHER): Payer: Medicare Other | Admitting: Family Medicine

## 2012-08-28 VITALS — BP 100/70 | HR 86 | Temp 97.6°F | Resp 16 | Ht 62.0 in | Wt 216.2 lb

## 2012-08-28 DIAGNOSIS — E119 Type 2 diabetes mellitus without complications: Secondary | ICD-10-CM

## 2012-08-28 DIAGNOSIS — M549 Dorsalgia, unspecified: Secondary | ICD-10-CM

## 2012-08-28 DIAGNOSIS — J45909 Unspecified asthma, uncomplicated: Secondary | ICD-10-CM

## 2012-08-28 DIAGNOSIS — R059 Cough, unspecified: Secondary | ICD-10-CM

## 2012-08-28 DIAGNOSIS — J029 Acute pharyngitis, unspecified: Secondary | ICD-10-CM

## 2012-08-28 DIAGNOSIS — R05 Cough: Secondary | ICD-10-CM

## 2012-08-28 DIAGNOSIS — F17200 Nicotine dependence, unspecified, uncomplicated: Secondary | ICD-10-CM

## 2012-08-28 LAB — POCT RAPID STREP A (OFFICE): Rapid Strep A Screen: NEGATIVE

## 2012-08-28 MED ORDER — HYDROCODONE-HOMATROPINE 5-1.5 MG/5ML PO SYRP: 5.0000 mL | ORAL_SOLUTION | Freq: Three times a day (TID) | ORAL | Status: DC | PRN

## 2012-08-28 MED ORDER — CLINDAMYCIN HCL 150 MG PO CAPS: 300.0000 mg | ORAL_CAPSULE | Freq: Three times a day (TID) | ORAL | Status: DC

## 2012-08-28 MED ORDER — BUPROPION HCL ER (SMOKING DET) 150 MG PO TB12
150.0000 mg | ORAL_TABLET | Freq: Two times a day (BID) | ORAL | Status: DC
Start: 2012-08-28 — End: 2012-11-08

## 2012-08-28 MED ORDER — ALBUTEROL SULFATE HFA 108 (90 BASE) MCG/ACT IN AERS: 1.0000 | INHALATION_SPRAY | Freq: Four times a day (QID) | RESPIRATORY_TRACT | Status: DC | PRN

## 2012-08-28 MED ORDER — METFORMIN HCL 500 MG PO TABS: 1000.0000 mg | ORAL_TABLET | Freq: Two times a day (BID) | ORAL | Status: DC

## 2012-08-28 MED ORDER — HYDROCODONE-ACETAMINOPHEN 7.5-325 MG PO TABS: 1.0000 | ORAL_TABLET | Freq: Three times a day (TID) | ORAL | Status: DC | PRN

## 2012-08-28 NOTE — Progress Notes (Signed)
42 yo woman with 2 weeks cough and 3 days of sore throat.  Associated: irritated right ear like something is crawling in therre  Sig Negs:  No known fever.  Rx:  Taking Delsym  Objective:  NAD HEENT:  Red throat TM's:  Normal Chest:  Few expiratory wheezes Skin: no rash Heart: reg, no murmur Ext:  No edema Abdomen:  Tender left lower ribs, otherwise no tenderness, no masses or HSM Results for orders placed in visit on 08/28/12  POCT RAPID STREP A (OFFICE)      Result Value Range   Rapid Strep A Screen Negative  Negative     Assessment:  Pharygitis, cough  Plan:  z pak hydromet

## 2012-08-30 LAB — CULTURE, GROUP A STREP

## 2012-09-02 ENCOUNTER — Telehealth: Payer: Self-pay | Admitting: Radiology

## 2012-09-02 NOTE — Telephone Encounter (Signed)
Please advise, pharmacy has filled the Zyban for her, but dispensed wellbutrin in error (they both have the same generic name ) patient has not taken the Wellbutrin, pharmacy wants to know if okay to fill the Zyban, because patient states she has had trouble with Wellbutrin in the past. Do you want to change this ? [phone]

## 2012-09-02 NOTE — Telephone Encounter (Signed)
Patient requested the Zyban.  Let's change to Zyban.  Can you call her? KL

## 2012-09-03 NOTE — Telephone Encounter (Signed)
Pt called back and stated that she did not have a problem when she used Zyban in the past, it was the Wellbutrin that caused a problem, and she was not on an anti-depressant then. She is going to try the Zyban but will stop it immediately if she has any problems and will let us know.

## 2012-09-03 NOTE — Telephone Encounter (Signed)
Called her, to advise and she states the Wellbutrin did not help, but she had suicidal thoughts with the Wellbutrin. I have advised her not to take the Wellbutrin/ Zyban at all. Please advise if there is an alternative. She is asking about Chantix. Please advise.

## 2012-09-03 NOTE — Telephone Encounter (Signed)
Called her about this. Left message for her to call me back.  

## 2012-09-03 NOTE — Telephone Encounter (Signed)
She should NOT use the Zyban, it is the same medication as Wellbutrin and she had suicidal thoughts when she was on this medication. Discussed with Dr Milus Glazier AND Avelino Leeds, I did advise her of this earlier.

## 2012-09-03 NOTE — Telephone Encounter (Signed)
Thanks, I left message for her to call me back, so I can advise.

## 2012-09-03 NOTE — Telephone Encounter (Signed)
Chantix also causes bad dreams and depression.  Probably should stick to e-cigarette, patches, or nicorette gum.

## 2012-09-09 NOTE — Telephone Encounter (Signed)
Called pt back and advised her that even though she has taken the Zyban w/out any problems in the past, Dr L does NOT want her to take it because she has had a problem w/Wellbutrin which is the same medication. Pt stated again that she didn't have a problem w/Zyban and also was not on an anti-depressant when she had the SI w/Wellbutrin. Advised pt that we just want her to be safe and that she can RTC to discuss w/Dr L further and discuss this and other alternatives. Pt agreed.

## 2012-09-09 NOTE — Telephone Encounter (Signed)
Is this done?

## 2012-09-25 ENCOUNTER — Ambulatory Visit (INDEPENDENT_AMBULATORY_CARE_PROVIDER_SITE_OTHER): Payer: Medicare Other | Admitting: Family Medicine

## 2012-09-25 ENCOUNTER — Ambulatory Visit: Payer: Medicare Other

## 2012-09-25 VITALS — BP 95/66 | HR 80 | Temp 99.0°F | Resp 16 | Ht 61.5 in | Wt 216.0 lb

## 2012-09-25 DIAGNOSIS — R079 Chest pain, unspecified: Secondary | ICD-10-CM

## 2012-09-25 DIAGNOSIS — E1165 Type 2 diabetes mellitus with hyperglycemia: Secondary | ICD-10-CM

## 2012-09-25 DIAGNOSIS — E119 Type 2 diabetes mellitus without complications: Secondary | ICD-10-CM

## 2012-09-25 LAB — POCT CBC
Granulocyte percent: 61.7 %G (ref 37–80)
HCT, POC: 41.2 % (ref 37.7–47.9)
Hemoglobin: 13.2 g/dL (ref 12.2–16.2)
Lymph, poc: 3.1 (ref 0.6–3.4)
MCH, POC: 28.3 pg (ref 27–31.2)
MCHC: 32 g/dL (ref 31.8–35.4)
MCV: 88.4 fL (ref 80–97)
MID (cbc): 0.6 (ref 0–0.9)
MPV: 8.5 fL (ref 0–99.8)
POC Granulocyte: 5.9 (ref 2–6.9)
POC LYMPH PERCENT: 32.3 %L (ref 10–50)
POC MID %: 6 %M (ref 0–12)
Platelet Count, POC: 236 10*3/uL (ref 142–424)
RBC: 4.66 M/uL (ref 4.04–5.48)
RDW, POC: 14.7 %
WBC: 9.6 10*3/uL (ref 4.6–10.2)

## 2012-09-25 LAB — POCT SEDIMENTATION RATE: POCT SED RATE: 24 mm/hr — AB (ref 0–22)

## 2012-09-25 LAB — GLUCOSE, POCT (MANUAL RESULT ENTRY): POC Glucose: 67 mg/dl — AB (ref 70–99)

## 2012-09-25 MED ORDER — CYCLOBENZAPRINE HCL 10 MG PO TABS: 10.0000 mg | ORAL_TABLET | Freq: Every evening | ORAL | Status: DC | PRN

## 2012-09-25 NOTE — Progress Notes (Addendum)
42 yo woman with persistent chronic cough who now has right chest pain.  She has some dizziness as she had last month.  Chest is nontender, but steady deep chest wall burning 7/10 in intensity.  No nausea  Objective:  NAD HEENT:  Unremarkable Neck:  Supple, no adenopathy Chest:  Clear Heart:  Regular, no murmur Skin:  Warm and dry [facility] reading (PRIMARY) by  Dr. Milus Glazier:  Post mastectomy with surgical clips, no infiltrate Results for orders placed in visit on 09/25/12  POCT CBC      Result Value Range   WBC 9.6  4.6 - 10.2 K/uL   Lymph, poc 3.1  0.6 - 3.4   POC LYMPH PERCENT 32.3  10 - 50 %L   MID (cbc) 0.6  0 - 0.9   POC MID % 6.0  0 - 12 %M   POC Granulocyte 5.9  2 - 6.9   Granulocyte percent 61.7  37 - 80 %G   RBC 4.66  4.04 - 5.48 M/uL   Hemoglobin 13.2  12.2 - 16.2 g/dL   HCT, POC 16.1  09.6 - 47.9 %   MCV 88.4  80 - 97 fL   MCH, POC 28.3  27 - 31.2 pg   MCHC 32.0  31.8 - 35.4 g/dL   RDW, POC 04.5     Platelet Count, POC 236  142 - 424 K/uL   MPV 8.5  0 - 99.8 fL  GLUCOSE, POCT (MANUAL RESULT ENTRY)      Result Value Range   POC Glucose 67 (*) 70 - 99 mg/dl   Assessment: most consistent with muscle strain  plan  Chest pain, unspecified - Plan: DG Chest 2 View, POCT CBC, POCT glucose (manual entry), POCT SEDIMENTATION RATE, cyclobenzaprine (FLEXERIL) 10 MG tablet  Diabetes type 2, uncontrolled  Signed, Elvina Sidle, MD

## 2012-10-26 ENCOUNTER — Other Ambulatory Visit: Payer: Self-pay | Admitting: Family Medicine

## 2012-10-28 ENCOUNTER — Telehealth: Payer: Self-pay | Admitting: Family Medicine

## 2012-10-28 NOTE — Telephone Encounter (Signed)
Molli Hazard from [facility] called stating request sent for alprazolam 1 mg 1 po qhs prn sleep and refill request denied but new rx to follow. They have not received new rx. Please advise

## 2012-10-29 NOTE — Telephone Encounter (Signed)
See Dr Cain Saupe note below

## 2012-10-29 NOTE — Telephone Encounter (Signed)
I would appreciate your calling in the alprazolam 1 mg prn hs, #90, 1 refill.  I am out of town.

## 2012-10-29 NOTE — Telephone Encounter (Signed)
rx faxed

## 2012-11-05 ENCOUNTER — Encounter (HOSPITAL_COMMUNITY): Payer: Self-pay | Admitting: Emergency Medicine

## 2012-11-05 ENCOUNTER — Emergency Department (HOSPITAL_COMMUNITY)
Admission: EM | Admit: 2012-11-05 | Discharge: 2012-11-06 | Disposition: A | Discharge status: Home / Self Care | Payer: Medicare Other | Attending: Emergency Medicine | Admitting: Emergency Medicine

## 2012-11-05 DIAGNOSIS — E119 Type 2 diabetes mellitus without complications: Secondary | ICD-10-CM | POA: Insufficient documentation

## 2012-11-05 DIAGNOSIS — F17200 Nicotine dependence, unspecified, uncomplicated: Secondary | ICD-10-CM | POA: Insufficient documentation

## 2012-11-05 DIAGNOSIS — Z853 Personal history of malignant neoplasm of breast: Secondary | ICD-10-CM | POA: Insufficient documentation

## 2012-11-05 DIAGNOSIS — Z880 Allergy status to penicillin: Secondary | ICD-10-CM | POA: Insufficient documentation

## 2012-11-05 DIAGNOSIS — H5789 Other specified disorders of eye and adnexa: Secondary | ICD-10-CM | POA: Insufficient documentation

## 2012-11-05 DIAGNOSIS — M810 Age-related osteoporosis without current pathological fracture: Secondary | ICD-10-CM | POA: Insufficient documentation

## 2012-11-05 DIAGNOSIS — L02211 Cutaneous abscess of abdominal wall: Secondary | ICD-10-CM

## 2012-11-05 DIAGNOSIS — F329 Major depressive disorder, single episode, unspecified: Secondary | ICD-10-CM | POA: Insufficient documentation

## 2012-11-05 DIAGNOSIS — Z794 Long term (current) use of insulin: Secondary | ICD-10-CM | POA: Insufficient documentation

## 2012-11-05 DIAGNOSIS — J45909 Unspecified asthma, uncomplicated: Secondary | ICD-10-CM | POA: Insufficient documentation

## 2012-11-05 DIAGNOSIS — F3289 Other specified depressive episodes: Secondary | ICD-10-CM | POA: Insufficient documentation

## 2012-11-05 DIAGNOSIS — L02219 Cutaneous abscess of trunk, unspecified: Secondary | ICD-10-CM | POA: Insufficient documentation

## 2012-11-05 DIAGNOSIS — F411 Generalized anxiety disorder: Secondary | ICD-10-CM | POA: Insufficient documentation

## 2012-11-05 DIAGNOSIS — IMO0002 Reserved for concepts with insufficient information to code with codable children: Secondary | ICD-10-CM | POA: Insufficient documentation

## 2012-11-05 DIAGNOSIS — Z79899 Other long term (current) drug therapy: Secondary | ICD-10-CM | POA: Insufficient documentation

## 2012-11-05 MED ORDER — IBUPROFEN 100 MG/5ML PO SUSP
800.0000 mg | Freq: Once | ORAL | Status: DC
  Filled 2012-11-05: qty 40

## 2012-11-05 MED ORDER — DOXYCYCLINE HYCLATE 100 MG PO CAPS: 100.0000 mg | ORAL_CAPSULE | Freq: Two times a day (BID) | ORAL | Status: DC

## 2012-11-05 MED ORDER — DOXYCYCLINE HYCLATE 100 MG PO TABS
100.0000 mg | ORAL_TABLET | Freq: Once | ORAL | Status: AC
  Administered 2012-11-06: 100 mg via ORAL
  Filled 2012-11-05: qty 1

## 2012-11-05 NOTE — ED Provider Notes (Signed)
CSN: [id number]     Arrival date & time 11/05/12  2320 History     First MD Initiated Contact with Patient 11/05/12 2331     Chief Complaint  Patient presents with  . Groin Swelling   (Consider location/radiation/quality/duration/timing/severity/associated sxs/prior Treatment) HPI HPI Comments: Ann Lopez is a 42 y.o. female who presents to the Emergency Department complaining of swollen painful areas to right groin/lower abdomen. She noticed a knot there two days ago and has since grown and is red and sore. Denies fever, chills.   PCP Dr. Milus Glazier  Past Medical History  Diagnosis Date  . Diabetes mellitus   . Back pain   . Osteopenia   . Allergy   . Asthma   . Cancer   . Breast cancer   . Depression   . Anxiety   . Osteoporosis     osteopenia   Past Surgical History  Procedure Laterality Date  . Mastectomy    . Abdominal hysterectomy    . Lymphadenectomy    . Cesarean section    . Breast surgery     Family History  Problem Relation Age of Onset  . Cancer Mother 36    breast cancer  . Hypertension Sister   . Hypothyroidism Brother   . Cancer Maternal Grandmother 66    breast cancer  . Cancer Maternal Grandfather 55    pancreatic cancer   History  Substance Use Topics  . Smoking status: Current Some Day Smoker    Types: Cigarettes  . Smokeless tobacco: Not on file  . Alcohol Use: No   OB History   Grav Para Term Preterm Abortions TAB SAB Ect Mult Living                 Review of Systems  Constitutional: Negative for fever.       10 Systems reviewed and are negative for acute change except as noted in the HPI.  HENT: Negative for congestion.   Eyes: Negative for discharge and redness.  Respiratory: Negative for cough and shortness of breath.   Cardiovascular: Negative for chest pain.  Gastrointestinal: Negative for vomiting and abdominal pain.  Musculoskeletal: Negative for back pain.  Skin: Negative for rash.       Lesion to groin/abdomen   Neurological: Negative for syncope, numbness and headaches.  Psychiatric/Behavioral:       No behavior change.    Allergies  Azithromycin; Morphine and related; Nitrofurantoin; Penicillins; and Sulfonamide derivatives  Home Medications   Current Outpatient Rx  Name  Route  Sig  Dispense  Refill  . albuterol (PROVENTIL HFA;VENTOLIN HFA) 108 (90 BASE) MCG/ACT inhaler   Inhalation   Inhale 1 puff into the lungs every 6 (six) hours as needed for wheezing.   1 Inhaler   10   . ALPRAZolam (XANAX) 1 MG tablet      TAKE 1 TABLET (1MG  TOTAL) BY MOUTH AT BEDTIME AS NEEDED FOR SLEEP   90 tablet   0     CYCLE FILL MEDICATION. Authorization is required f ...   . Blood Glucose Monitoring Suppl (ONE TOUCH ULTRA 2) W/DEVICE KIT      USE AS DIRECTED   1 each   0     No refills available. PT LOST MACHINE.   . clonazePAM (KLONOPIN) 1 MG tablet   Oral   Take 1 tablet (1 mg total) by mouth 2 (two) times daily as needed for anxiety.   180 tablet   1  Needs office visit   . desvenlafaxine (PRISTIQ) 100 MG 24 hr tablet   Oral   Take 1 tablet (100 mg total) by mouth daily.   90 tablet   3   . fluticasone (FLONASE) 50 MCG/ACT nasal spray   Nasal   Place 2 sprays into the nose daily.   1 g   12   . HYDROcodone-acetaminophen (NORCO) 7.5-325 MG per tablet   Oral   Take 1 tablet by mouth every 8 (eight) hours as needed for pain.   90 tablet   3   . insulin aspart (NOVOLOG) 100 UNIT/ML injection   Subcutaneous   Inject into the skin 3 (three) times daily before meals.         . insulin glargine (LANTUS) 100 UNIT/ML injection   Subcutaneous   Inject 20 Units into the skin 2 (two) times daily.   10 mL   11   . metFORMIN (GLUCOPHAGE) 500 MG tablet   Oral   Take 2 tablets (1,000 mg total) by mouth 2 (two) times daily with a meal.   240 tablet   3   . Multiple Vitamin (MULTIVITAMIN) tablet   Oral   Take 1 tablet by mouth daily.         Marland Kitchen omeprazole (PRILOSEC) 20  MG capsule   Oral   Take 1 capsule (20 mg total) by mouth daily.   30 capsule   1     PATIENT NEEDS OFFICE VISIT FOR ADDITIONAL REFILLS   . Zoledronic Acid (ZOMETA IV)   Intravenous   Inject into the vein. Every 6 months         . buPROPion (ZYBAN) 150 MG 12 hr tablet   Oral   Take 1 tablet (150 mg total) by mouth 2 (two) times daily.   60 tablet   3   . clindamycin (CLEOCIN) 150 MG capsule   Oral   Take 2 capsules (300 mg total) by mouth 3 (three) times daily. May dispense as 150mg  capsules   60 capsule   0   . cyclobenzaprine (FLEXERIL) 10 MG tablet   Oral   Take 1 tablet (10 mg total) by mouth at bedtime and may repeat dose one time if needed.   12 tablet   0   . HYDROcodone-homatropine (HYCODAN) 5-1.5 MG/5ML syrup   Oral   Take 5 mLs by mouth every 8 (eight) hours as needed for cough.   120 mL   0   . naproxen (NAPROSYN) 500 MG tablet   Oral   Take 1 tablet (500 mg total) by mouth 2 (two) times daily with a meal.   30 tablet   0    BP 116/72  Pulse 92  Temp(Src) 98 F (36.7 C) (Oral)  Resp 20  Ht 5\' 2"  (1.575 m)  Wt 210 lb (95.255 kg)  BMI 38.4 kg/m2  SpO2 97% Physical Exam  Nursing note and vitals reviewed. Constitutional: She appears well-developed and well-nourished.  Awake, alert, nontoxic appearance.  HENT:  Head: Normocephalic and atraumatic.  Eyes: EOM are normal. Pupils are equal, round, and reactive to light. Right eye exhibits discharge. Left eye exhibits discharge.  Neck: Normal range of motion. Neck supple.  Cardiovascular: Normal rate and intact distal pulses.   Pulmonary/Chest: Effort normal and breath sounds normal. She exhibits no tenderness.  Abdominal: Soft. Bowel sounds are normal. There is no tenderness. There is no rebound.  Musculoskeletal: She exhibits no tenderness.  Baseline ROM, no obvious new focal  weakness.  Neurological:  Mental status and motor strength appears baseline for patient and situation.  Skin: No rash  noted.  Lower abdomen under the pannus beginning abscess, 2 cm tender erythematous, non fluctuant mass.   Psychiatric: She has a normal mood and affect.    ED Course   Procedures (including critical care time)    MDM  Patient with beginning abscess on left side of lower abdomen/groin. Given ibuprofen and antibiotics. Advised to return to the ER for I&D if needed. Pt stable in ED with no significant deterioration in condition.The patient appears reasonably screened and/or stabilized for discharge and I doubt any other medical condition or other [facility] requiring further screening, evaluation, or treatment in the ED at this time prior to discharge.  MDM Reviewed: nursing note and vitals     Nicoletta Dress. Colon Branch, MD 11/06/12 8119

## 2012-11-05 NOTE — ED Notes (Signed)
Patient states she noticed a hard knot in her left groin about 3 weeks ago.  Patient states it was the size of a penny, but in the last 2 days it has grown.

## 2012-11-06 ENCOUNTER — Inpatient Hospital Stay (EMERGENCY_DEPARTMENT_HOSPITAL)
Admission: AD | Admit: 2012-11-06 | Discharge: 2012-11-06 | Disposition: A | Discharge status: Home / Self Care | Payer: Medicare Other | Source: Ambulatory Visit | Attending: Obstetrics & Gynecology | Admitting: Obstetrics & Gynecology

## 2012-11-06 ENCOUNTER — Encounter (HOSPITAL_COMMUNITY): Payer: Self-pay | Admitting: Family

## 2012-11-06 DIAGNOSIS — L02219 Cutaneous abscess of trunk, unspecified: Secondary | ICD-10-CM | POA: Insufficient documentation

## 2012-11-06 DIAGNOSIS — L02224 Furuncle of groin: Secondary | ICD-10-CM

## 2012-11-06 MED ORDER — PROMETHAZINE HCL 25 MG PO TABS: 25.0000 mg | ORAL_TABLET | Freq: Four times a day (QID) | ORAL | Status: DC | PRN

## 2012-11-06 MED ORDER — ONDANSETRON 8 MG PO TBDP
8.0000 mg | ORAL_TABLET | Freq: Once | ORAL | Status: AC
  Administered 2012-11-06: 8 mg via ORAL
  Filled 2012-11-06: qty 1

## 2012-11-06 MED ORDER — CLINDAMYCIN HCL 300 MG PO CAPS: 300.0000 mg | ORAL_CAPSULE | Freq: Three times a day (TID) | ORAL | Status: DC

## 2012-11-06 MED ORDER — CIPROFLOXACIN HCL 250 MG PO TABS
750.0000 mg | ORAL_TABLET | Freq: Once | ORAL | Status: AC
  Administered 2012-11-06: 750 mg via ORAL
  Filled 2012-11-06: qty 3

## 2012-11-06 MED ORDER — IBUPROFEN 800 MG PO TABS
ORAL_TABLET | ORAL | Status: AC
  Administered 2012-11-06: 800 mg
  Filled 2012-11-06: qty 1

## 2012-11-06 NOTE — MAU Provider Note (Signed)
History     CSN: [id number]  Arrival date and time: 11/06/12 2156   First Provider Initiated Contact with Patient 11/06/12 2236      Chief Complaint  Patient presents with  . Groin Swelling   HPI This is a 42 y.o. female who presents with c/o abscess on left groin. Was seen at [facility] yesterday and given Doxy, which has made her sick. Had fever and pain, with abscess increased in size over past week or so.   RN Note: Patient presents to MAU with c/o painful swollen area near groin. Reports she was seen at [facility] last night and was put on Doxycyline and told to come to ER if pain worsens.  Reports fever of 102.6 an hour after taking 800 mg ibuprofen at 2000 today.  Patient reports she had a knot in that area for 4 weeks, then Monday morning woke up and the area was much larger, progressing to painful yesterday.  Patient is 5 years out of chemo for breast cancer; double mastectomy in 2009.   OB History   Grav Para Term Preterm Abortions TAB SAB Ect Mult Living                  Past Medical History  Diagnosis Date  . Diabetes mellitus   . Back pain   . Osteopenia   . Allergy   . Asthma   . Cancer   . Breast cancer   . Depression   . Anxiety   . Osteoporosis     osteopenia    Past Surgical History  Procedure Laterality Date  . Mastectomy    . Abdominal hysterectomy    . Lymphadenectomy    . Cesarean section    . Breast surgery      Family History  Problem Relation Age of Onset  . Cancer Mother 9    breast cancer  . Hypertension Sister   . Hypothyroidism Brother   . Cancer Maternal Grandmother 20    breast cancer  . Cancer Maternal Grandfather 55    pancreatic cancer    History  Substance Use Topics  . Smoking status: Current Some Day Smoker    Types: Cigarettes  . Smokeless tobacco: Not on file  . Alcohol Use: No    Allergies:  Allergies  Allergen Reactions  . Azithromycin     Unknown  . Morphine And Related     Can tolerate  hydrocodone and dilaudid without side effects  . Nitrofurantoin     Unknown  . Penicillins     Unknown  . Sulfonamide Derivatives     Unknown    Prescriptions prior to admission  Medication Sig Dispense Refill  . albuterol (PROVENTIL HFA;VENTOLIN HFA) 108 (90 BASE) MCG/ACT inhaler Inhale 1 puff into the lungs every 6 (six) hours as needed for wheezing.  1 Inhaler  10  . ALPRAZolam (XANAX) 1 MG tablet TAKE 1 TABLET (1MG  TOTAL) BY MOUTH AT BEDTIME AS NEEDED FOR SLEEP  90 tablet  0  . Blood Glucose Monitoring Suppl (ONE TOUCH ULTRA 2) W/DEVICE KIT USE AS DIRECTED  1 each  0  . buPROPion (ZYBAN) 150 MG 12 hr tablet Take 1 tablet (150 mg total) by mouth 2 (two) times daily.  60 tablet  3  . clindamycin (CLEOCIN) 150 MG capsule Take 2 capsules (300 mg total) by mouth 3 (three) times daily. May dispense as 150mg  capsules  60 capsule  0  . clonazePAM (KLONOPIN) 1 MG tablet Take  1 tablet (1 mg total) by mouth 2 (two) times daily as needed for anxiety.  180 tablet  1  . cyclobenzaprine (FLEXERIL) 10 MG tablet Take 1 tablet (10 mg total) by mouth at bedtime and may repeat dose one time if needed.  12 tablet  0  . desvenlafaxine (PRISTIQ) 100 MG 24 hr tablet Take 1 tablet (100 mg total) by mouth daily.  90 tablet  3  . doxycycline (VIBRAMYCIN) 100 MG capsule Take 1 capsule (100 mg total) by mouth 2 (two) times daily.  20 capsule  0  . fluticasone (FLONASE) 50 MCG/ACT nasal spray Place 2 sprays into the nose daily.  1 g  12  . HYDROcodone-acetaminophen (NORCO) 7.5-325 MG per tablet Take 1 tablet by mouth every 8 (eight) hours as needed for pain.  90 tablet  3  . HYDROcodone-homatropine (HYCODAN) 5-1.5 MG/5ML syrup Take 5 mLs by mouth every 8 (eight) hours as needed for cough.  120 mL  0  . insulin aspart (NOVOLOG) 100 UNIT/ML injection Inject into the skin 3 (three) times daily before meals.      . insulin glargine (LANTUS) 100 UNIT/ML injection Inject 20 Units into the skin 2 (two) times daily.  10 mL   11  . metFORMIN (GLUCOPHAGE) 500 MG tablet Take 2 tablets (1,000 mg total) by mouth 2 (two) times daily with a meal.  240 tablet  3  . Multiple Vitamin (MULTIVITAMIN) tablet Take 1 tablet by mouth daily.      . naproxen (NAPROSYN) 500 MG tablet Take 1 tablet (500 mg total) by mouth 2 (two) times daily with a meal.  30 tablet  0  . omeprazole (PRILOSEC) 20 MG capsule Take 1 capsule (20 mg total) by mouth daily.  30 capsule  1  . Zoledronic Acid (ZOMETA IV) Inject into the vein. Every 6 months        Review of Systems  Constitutional: Positive for fever and malaise/fatigue. Negative for chills.  Gastrointestinal: Positive for nausea and vomiting. Negative for abdominal pain, diarrhea and constipation.  Musculoskeletal: Negative for myalgias.  Neurological: Negative for dizziness and headaches.   Physical Exam   Blood pressure 105/71, pulse 106, temperature 100.5 F (38.1 C), temperature source Oral, resp. rate 18, height 5\' 2"  (1.575 m), weight 95.255 kg (210 lb), SpO2 99.00%.  Physical Exam  Constitutional: She is oriented to person, place, and time. She appears well-developed and well-nourished. No distress.  HENT:  Head: Normocephalic.  Cardiovascular: Normal rate.   Respiratory: Effort normal.  GI: Soft. There is no tenderness.  Musculoskeletal: Normal range of motion.  Neurological: She is alert and oriented to person, place, and time.  Skin: Skin is warm and dry.  1-2 cm fluctuant area on left groin (anterior, to left of mons) with surrounding induration about 5cm. No pointing seen. Most of area is indurated.     MAU Course  Procedures  MDM Given one dose of Cipro here to give her time to get to pharmacy  Assessment and Plan  A:  Left groin abscess/furuncle  P:  Discussed with Dr Macon Large      Patient is allergic to Sulfa drugs (hives)      Initially planned to give her Cipro, but Up to Date states there is a risk of resistance with Cipro so they recommend Clindamycin to  cover possible MRSA and other aerobes and anaerobes.      Rx Clindamycin 300mg  tid for 10 days      If worsens,  may need surgical consult      Advised to call her family doctor tomorrow so he can monitor progress   [facility] 11/06/2012, 11:09 PM

## 2012-11-06 NOTE — MAU Note (Signed)
Patient presents to MAU with c/o painful swollen area near groin. Reports she was seen at [facility] last night and was put on Doxycyline and told to come to ER if pain worsens. Reports fever of 102.6 an hour after taking 800 mg ibuprofen at 2000 today.   Patient reports she had a knot in that area for 4 weeks, then Monday morning woke up and the area was much larger, progressing to painful yesterday.  Patient is 5 years out of chemo for breast cancer; double mastectomy in 2009.

## 2012-11-06 NOTE — MAU Note (Signed)
Pt reports she had a lump come up in her groin area about 4 weeks ago that was the size of a nickel, it did not get any bigger until about 3 days ago and it got a lot bigger and has been really painful. Seen in [facility] ER and given Doxycycline. Took one dose last pm and one this am. Has had a fever all day and nauseated. Temp 102 at home

## 2012-11-08 ENCOUNTER — Inpatient Hospital Stay (HOSPITAL_COMMUNITY)
Admission: EM | Admit: 2012-11-08 | Discharge: 2012-11-17 | DRG: 871 | Disposition: A | Discharge status: Home Health | Payer: Medicare Other | Attending: Internal Medicine | Admitting: Internal Medicine

## 2012-11-08 ENCOUNTER — Encounter (HOSPITAL_COMMUNITY): Payer: Self-pay

## 2012-11-08 ENCOUNTER — Emergency Department (HOSPITAL_COMMUNITY): Payer: Medicare Other

## 2012-11-08 DIAGNOSIS — Z1509 Genetic susceptibility to other malignant neoplasm: Secondary | ICD-10-CM

## 2012-11-08 DIAGNOSIS — D539 Nutritional anemia, unspecified: Secondary | ICD-10-CM

## 2012-11-08 DIAGNOSIS — K219 Gastro-esophageal reflux disease without esophagitis: Secondary | ICD-10-CM

## 2012-11-08 DIAGNOSIS — Z79899 Other long term (current) drug therapy: Secondary | ICD-10-CM

## 2012-11-08 DIAGNOSIS — M899 Disorder of bone, unspecified: Secondary | ICD-10-CM

## 2012-11-08 DIAGNOSIS — K589 Irritable bowel syndrome without diarrhea: Secondary | ICD-10-CM

## 2012-11-08 DIAGNOSIS — M549 Dorsalgia, unspecified: Secondary | ICD-10-CM

## 2012-11-08 DIAGNOSIS — Z803 Family history of malignant neoplasm of breast: Secondary | ICD-10-CM

## 2012-11-08 DIAGNOSIS — F411 Generalized anxiety disorder: Secondary | ICD-10-CM | POA: Diagnosis present

## 2012-11-08 DIAGNOSIS — E785 Hyperlipidemia, unspecified: Secondary | ICD-10-CM

## 2012-11-08 DIAGNOSIS — M810 Age-related osteoporosis without current pathological fracture: Secondary | ICD-10-CM | POA: Diagnosis present

## 2012-11-08 DIAGNOSIS — E669 Obesity, unspecified: Secondary | ICD-10-CM

## 2012-11-08 DIAGNOSIS — M949 Disorder of cartilage, unspecified: Secondary | ICD-10-CM

## 2012-11-08 DIAGNOSIS — L03319 Cellulitis of trunk, unspecified: Secondary | ICD-10-CM | POA: Diagnosis present

## 2012-11-08 DIAGNOSIS — E876 Hypokalemia: Secondary | ICD-10-CM | POA: Diagnosis present

## 2012-11-08 DIAGNOSIS — A6000 Herpesviral infection of urogenital system, unspecified: Secondary | ICD-10-CM

## 2012-11-08 DIAGNOSIS — Z9010 Acquired absence of unspecified breast and nipple: Secondary | ICD-10-CM

## 2012-11-08 DIAGNOSIS — Z853 Personal history of malignant neoplasm of breast: Secondary | ICD-10-CM

## 2012-11-08 DIAGNOSIS — Z881 Allergy status to other antibiotic agents status: Secondary | ICD-10-CM

## 2012-11-08 DIAGNOSIS — Z880 Allergy status to penicillin: Secondary | ICD-10-CM

## 2012-11-08 DIAGNOSIS — Z6838 Body mass index (BMI) 38.0-38.9, adult: Secondary | ICD-10-CM

## 2012-11-08 DIAGNOSIS — I10 Essential (primary) hypertension: Secondary | ICD-10-CM | POA: Diagnosis present

## 2012-11-08 DIAGNOSIS — Z1501 Genetic susceptibility to malignant neoplasm of breast: Secondary | ICD-10-CM

## 2012-11-08 DIAGNOSIS — Q078 Other specified congenital malformations of nervous system: Secondary | ICD-10-CM

## 2012-11-08 DIAGNOSIS — E871 Hypo-osmolality and hyponatremia: Secondary | ICD-10-CM | POA: Diagnosis present

## 2012-11-08 DIAGNOSIS — Z882 Allergy status to sulfonamides status: Secondary | ICD-10-CM

## 2012-11-08 DIAGNOSIS — E119 Type 2 diabetes mellitus without complications: Secondary | ICD-10-CM | POA: Diagnosis present

## 2012-11-08 DIAGNOSIS — Z794 Long term (current) use of insulin: Secondary | ICD-10-CM

## 2012-11-08 DIAGNOSIS — F341 Dysthymic disorder: Secondary | ICD-10-CM

## 2012-11-08 DIAGNOSIS — Z885 Allergy status to narcotic agent status: Secondary | ICD-10-CM

## 2012-11-08 DIAGNOSIS — R0602 Shortness of breath: Secondary | ICD-10-CM

## 2012-11-08 DIAGNOSIS — J189 Pneumonia, unspecified organism: Secondary | ICD-10-CM | POA: Diagnosis present

## 2012-11-08 DIAGNOSIS — F3289 Other specified depressive episodes: Secondary | ICD-10-CM | POA: Diagnosis present

## 2012-11-08 DIAGNOSIS — E860 Dehydration: Secondary | ICD-10-CM

## 2012-11-08 DIAGNOSIS — A419 Sepsis, unspecified organism: Principal | ICD-10-CM | POA: Diagnosis present

## 2012-11-08 DIAGNOSIS — J309 Allergic rhinitis, unspecified: Secondary | ICD-10-CM

## 2012-11-08 DIAGNOSIS — F329 Major depressive disorder, single episode, unspecified: Secondary | ICD-10-CM

## 2012-11-08 DIAGNOSIS — Z9884 Bariatric surgery status: Secondary | ICD-10-CM

## 2012-11-08 DIAGNOSIS — L02219 Cutaneous abscess of trunk, unspecified: Secondary | ICD-10-CM | POA: Diagnosis present

## 2012-11-08 DIAGNOSIS — L02214 Cutaneous abscess of groin: Secondary | ICD-10-CM | POA: Diagnosis present

## 2012-11-08 DIAGNOSIS — F17200 Nicotine dependence, unspecified, uncomplicated: Secondary | ICD-10-CM | POA: Diagnosis present

## 2012-11-08 LAB — BASIC METABOLIC PANEL
BUN: 10 mg/dL (ref 6–23)
CO2: 24 mEq/L (ref 19–32)
Calcium: 8.9 mg/dL (ref 8.4–10.5)
GFR calc non Af Amer: >90 mL/min (ref >90)
Glucose, Bld: 278 mg/dL — ABNORMAL HIGH (ref 70–99)
Sodium: 127 mEq/L — ABNORMAL LOW (ref 135–145)

## 2012-11-08 LAB — CBC WITH DIFFERENTIAL/PLATELET
Eosinophils Absolute: 0 10*3/uL (ref 0.0–0.7)
Eosinophils Relative: 0 % (ref 0–5)
HCT: 40.4 % (ref 36.0–46.0)
Lymphocytes Relative: 10 % — ABNORMAL LOW (ref 12–46)
Lymphs Abs: 2.4 10*3/uL (ref 0.7–4.0)
MCH: 29 pg (ref 26.0–34.0)
MCV: 84.2 fL (ref 78.0–100.0)
Monocytes Relative: 8 % (ref 3–12)

## 2012-11-08 LAB — GLUCOSE, CAPILLARY: Glucose-Capillary: 203 mg/dL — ABNORMAL HIGH (ref 70–99)

## 2012-11-08 MED ORDER — BIOTENE DRY MOUTH MT LIQD
15.0000 mL | Freq: Two times a day (BID) | OROMUCOSAL | Status: DC
  Administered 2012-11-08 – 2012-11-17 (×13): 15 mL via OROMUCOSAL

## 2012-11-08 MED ORDER — SODIUM CHLORIDE 0.9 % IV SOLN
INTRAVENOUS | Status: DC
  Administered 2012-11-09 – 2012-11-10 (×2): via INTRAVENOUS
  Administered 2012-11-10: 125 mL/h via INTRAVENOUS

## 2012-11-08 MED ORDER — VENLAFAXINE HCL ER 75 MG PO CP24
150.0000 mg | ORAL_CAPSULE | Freq: Every day | ORAL | Status: DC
  Administered 2012-11-09 – 2012-11-15 (×6): 150 mg via ORAL
  Filled 2012-11-08 (×6): qty 2

## 2012-11-08 MED ORDER — ACETAMINOPHEN 650 MG RE SUPP: 650.0000 mg | Freq: Four times a day (QID) | RECTAL | Status: DC | PRN

## 2012-11-08 MED ORDER — IPRATROPIUM BROMIDE 0.02 % IN SOLN
0.5000 mg | Freq: Once | RESPIRATORY_TRACT | Status: AC
  Administered 2012-11-08: 0.5 mg via RESPIRATORY_TRACT
  Filled 2012-11-08: qty 2.5

## 2012-11-08 MED ORDER — FLUTICASONE PROPIONATE 50 MCG/ACT NA SUSP
2.0000 | Freq: Every day | NASAL | Status: DC
  Administered 2012-11-08 – 2012-11-17 (×9): 2 via NASAL
  Filled 2012-11-08: qty 16

## 2012-11-08 MED ORDER — VANCOMYCIN HCL IN DEXTROSE 1-5 GM/200ML-% IV SOLN
1000.0000 mg | Freq: Once | INTRAVENOUS | Status: AC
  Administered 2012-11-08: 1000 mg via INTRAVENOUS
  Filled 2012-11-08: qty 200

## 2012-11-08 MED ORDER — LEVOFLOXACIN IN D5W 750 MG/150ML IV SOLN
750.0000 mg | INTRAVENOUS | Status: DC
  Administered 2012-11-08 – 2012-11-16 (×8): 750 mg via INTRAVENOUS
  Filled 2012-11-08 (×14): qty 150

## 2012-11-08 MED ORDER — ACETAMINOPHEN 500 MG PO TABS
1000.0000 mg | ORAL_TABLET | Freq: Once | ORAL | Status: AC
  Administered 2012-11-08: 1000 mg via ORAL
  Filled 2012-11-08: qty 2

## 2012-11-08 MED ORDER — HYDROMORPHONE HCL PF 1 MG/ML IJ SOLN
1.0000 mg | INTRAMUSCULAR | Status: DC | PRN
  Administered 2012-11-08 – 2012-11-17 (×40): 1 mg via INTRAVENOUS
  Filled 2012-11-08 (×40): qty 1

## 2012-11-08 MED ORDER — SODIUM CHLORIDE 0.9 % IV SOLN
Freq: Once | INTRAVENOUS | Status: AC
  Administered 2012-11-08: 11:00:00 via INTRAVENOUS

## 2012-11-08 MED ORDER — HYDROCODONE-ACETAMINOPHEN 5-325 MG PO TABS
1.0000 | ORAL_TABLET | ORAL | Status: DC | PRN
  Administered 2012-11-08 – 2012-11-11 (×5): 2 via ORAL
  Administered 2012-11-11: 1 via ORAL
  Administered 2012-11-12: 2 via ORAL
  Administered 2012-11-12: 1 via ORAL
  Administered 2012-11-12 – 2012-11-17 (×14): 2 via ORAL
  Filled 2012-11-08 (×13): qty 2
  Filled 2012-11-08: qty 1
  Filled 2012-11-08 (×8): qty 2

## 2012-11-08 MED ORDER — GUAIFENESIN-DM 100-10 MG/5ML PO SYRP
5.0000 mL | ORAL_SOLUTION | ORAL | Status: DC | PRN
  Administered 2012-11-08 – 2012-11-14 (×13): 5 mL via ORAL
  Filled 2012-11-08 (×14): qty 5

## 2012-11-08 MED ORDER — ALPRAZOLAM 0.5 MG PO TABS
1.0000 mg | ORAL_TABLET | Freq: Every evening | ORAL | Status: DC | PRN
  Administered 2012-11-12 – 2012-11-13 (×2): 1 mg via ORAL
  Filled 2012-11-08 (×2): qty 1

## 2012-11-08 MED ORDER — INSULIN ASPART 100 UNIT/ML ~~LOC~~ SOLN
30.0000 [IU] | Freq: Three times a day (TID) | SUBCUTANEOUS | Status: DC
  Administered 2012-11-08 – 2012-11-16 (×5): 30 [IU] via SUBCUTANEOUS

## 2012-11-08 MED ORDER — INSULIN ASPART 100 UNIT/ML ~~LOC~~ SOLN
0.0000 [IU] | Freq: Three times a day (TID) | SUBCUTANEOUS | Status: DC
  Administered 2012-11-08 – 2012-11-09 (×2): 5 [IU] via SUBCUTANEOUS
  Administered 2012-11-09: 3 [IU] via SUBCUTANEOUS
  Administered 2012-11-09: 5 [IU] via SUBCUTANEOUS
  Administered 2012-11-10: 3 [IU] via SUBCUTANEOUS
  Administered 2012-11-11 – 2012-11-12 (×3): 2 [IU] via SUBCUTANEOUS
  Administered 2012-11-14 – 2012-11-15 (×2): 5 [IU] via SUBCUTANEOUS
  Administered 2012-11-15: 3 [IU] via SUBCUTANEOUS
  Administered 2012-11-16: 2 [IU] via SUBCUTANEOUS

## 2012-11-08 MED ORDER — SODIUM CHLORIDE 0.9 % IV BOLUS (SEPSIS)
1000.0000 mL | Freq: Once | INTRAVENOUS | Status: AC
  Administered 2012-11-08: 1000 mL via INTRAVENOUS

## 2012-11-08 MED ORDER — HYDROMORPHONE HCL PF 1 MG/ML IJ SOLN
0.5000 mg | Freq: Once | INTRAMUSCULAR | Status: AC
  Administered 2012-11-08: 0.5 mg via INTRAVENOUS
  Filled 2012-11-08: qty 1

## 2012-11-08 MED ORDER — ALBUTEROL SULFATE (5 MG/ML) 0.5% IN NEBU
2.5000 mg | INHALATION_SOLUTION | Freq: Once | RESPIRATORY_TRACT | Status: AC
  Administered 2012-11-08: 2.5 mg via RESPIRATORY_TRACT
  Filled 2012-11-08: qty 0.5

## 2012-11-08 MED ORDER — INSULIN GLARGINE 100 UNIT/ML ~~LOC~~ SOLN
20.0000 [IU] | Freq: Two times a day (BID) | SUBCUTANEOUS | Status: DC
  Administered 2012-11-08 – 2012-11-16 (×12): 20 [IU] via SUBCUTANEOUS
  Filled 2012-11-08 (×19): qty 0.2

## 2012-11-08 MED ORDER — HYDROCOD POLST-CHLORPHEN POLST 10-8 MG/5ML PO LQCR
5.0000 mL | Freq: Once | ORAL | Status: AC
  Administered 2012-11-08: 5 mL via ORAL
  Filled 2012-11-08: qty 5

## 2012-11-08 MED ORDER — SODIUM CHLORIDE 0.9 % IV SOLN
INTRAVENOUS | Status: DC
  Administered 2012-11-08: 125 mL/h via INTRAVENOUS
  Administered 2012-11-12 – 2012-11-13 (×2): via INTRAVENOUS

## 2012-11-08 MED ORDER — GUAIFENESIN ER 600 MG PO TB12
600.0000 mg | ORAL_TABLET | Freq: Two times a day (BID) | ORAL | Status: DC
  Administered 2012-11-08 – 2012-11-17 (×18): 600 mg via ORAL
  Filled 2012-11-08 (×18): qty 1

## 2012-11-08 MED ORDER — CLONAZEPAM 0.5 MG PO TABS: 1.0000 mg | ORAL_TABLET | Freq: Two times a day (BID) | ORAL | Status: DC | PRN

## 2012-11-08 MED ORDER — ENOXAPARIN SODIUM 40 MG/0.4ML ~~LOC~~ SOLN
40.0000 mg | Freq: Every day | SUBCUTANEOUS | Status: DC
  Administered 2012-11-08 – 2012-11-17 (×10): 40 mg via SUBCUTANEOUS
  Filled 2012-11-08 (×9): qty 0.4

## 2012-11-08 MED ORDER — HYDROCOD POLST-CHLORPHEN POLST 10-8 MG/5ML PO LQCR
5.0000 mL | Freq: Two times a day (BID) | ORAL | Status: DC | PRN
  Administered 2012-11-08 – 2012-11-14 (×11): 5 mL via ORAL
  Filled 2012-11-08 (×11): qty 5

## 2012-11-08 MED ORDER — VANCOMYCIN HCL IN DEXTROSE 1-5 GM/200ML-% IV SOLN
1000.0000 mg | Freq: Three times a day (TID) | INTRAVENOUS | Status: DC
  Administered 2012-11-08 – 2012-11-10 (×5): 1000 mg via INTRAVENOUS
  Filled 2012-11-08 (×5): qty 200

## 2012-11-08 MED ORDER — SODIUM CHLORIDE 0.9 % IJ SOLN
3.0000 mL | Freq: Two times a day (BID) | INTRAMUSCULAR | Status: DC
  Administered 2012-11-10 – 2012-11-17 (×7): 3 mL via INTRAVENOUS

## 2012-11-08 MED ORDER — ACETAMINOPHEN 325 MG PO TABS
650.0000 mg | ORAL_TABLET | Freq: Four times a day (QID) | ORAL | Status: DC | PRN
  Administered 2012-11-09 – 2012-11-12 (×4): 650 mg via ORAL
  Filled 2012-11-08 (×4): qty 2

## 2012-11-08 MED ORDER — ALBUTEROL SULFATE (5 MG/ML) 0.5% IN NEBU
2.5000 mg | INHALATION_SOLUTION | RESPIRATORY_TRACT | Status: DC | PRN
  Administered 2012-11-08 – 2012-11-15 (×18): 2.5 mg via RESPIRATORY_TRACT
  Filled 2012-11-08 (×18): qty 0.5

## 2012-11-08 MED ORDER — ONDANSETRON HCL 4 MG/2ML IJ SOLN
4.0000 mg | Freq: Four times a day (QID) | INTRAMUSCULAR | Status: DC | PRN
  Administered 2012-11-08 – 2012-11-11 (×6): 4 mg via INTRAVENOUS
  Filled 2012-11-08 (×6): qty 2

## 2012-11-08 MED ORDER — ONDANSETRON HCL 4 MG PO TABS: 4.0000 mg | ORAL_TABLET | Freq: Four times a day (QID) | ORAL | Status: DC | PRN

## 2012-11-08 MED ORDER — PANTOPRAZOLE SODIUM 40 MG PO TBEC
40.0000 mg | DELAYED_RELEASE_TABLET | Freq: Every day | ORAL | Status: DC
  Administered 2012-11-08 – 2012-11-17 (×9): 40 mg via ORAL
  Filled 2012-11-08 (×8): qty 1

## 2012-11-08 MED ORDER — PNEUMOCOCCAL VAC POLYVALENT 25 MCG/0.5ML IJ INJ: 0.5000 mL | INJECTION | INTRAMUSCULAR | Status: DC

## 2012-11-08 MED ORDER — INSULIN ASPART 100 UNIT/ML ~~LOC~~ SOLN
0.0000 [IU] | Freq: Every day | SUBCUTANEOUS | Status: DC
  Administered 2012-11-14: 2 [IU] via SUBCUTANEOUS

## 2012-11-08 NOTE — H&P (Signed)
[facility] History and Physical  Ann Lopez:[id number] DOB: 1970/07/08 DOA: 11/08/2012  Referring physician: Ivery Quale, PA PCP: Elvina Sidle, MD  Specialists: Oncology at [facility]  Chief Complaint: cough  HPI: Ann Lopez is a 42 y.o. female with history of diabetes presents to the emergency room with several complaints. She reports noticing a coin sized lesion in her left groin approximately 4 weeks ago.  She had shown this to her oncologist, who did not feel that it was anything worrisome.  Suddenly, 2 weeks ago she noticed that this lesion was increasing in size.  Over the last week she has started to have fevers and has noted that this lesion has increased in size and had become tender, warm and red.  She had initially presented to the [facility] ED and was given a prescription for doxycycline.  She reports trying to take this, but felt nauseous and was vomiting.  She went to [facility] hospital for evaluation where they changed her antibiotics to clindamycin and told her that if her symptoms did not improve, then she may need a surgical consult.  She continued to have fevers and pain.  She noted that last night her lesion began to drain bloody, foul smelling discharge.  She also started developing cough overnight, as well as shortness of breath.  She denies any diarrhea, dysuria.  She has had nausea and vomiting.  She has a history of breast cancer 5 years ago and had undergone mastectomy.  She had a PET scan 2 weeks ago at [facility] and reports that this was read as "clear". Other prior surgeries include sleeve gastrectomy approximately a year ago.  She was evaluated in the ED and was found to be febrile, have a significant leukocytosis, CXR consistent with developing pneumonia and an abscess.  She has been referred for admission.  Review of Systems: pertinent positives as per HPI, otherwise negative  Past Medical History  Diagnosis Date  . Diabetes mellitus    . Back pain   . Osteopenia   . Allergy   . Asthma   . Cancer   . Breast cancer   . Depression   . Anxiety   . Osteoporosis     osteopenia   Past Surgical History  Procedure Laterality Date  . Mastectomy    . Abdominal hysterectomy    . Lymphadenectomy    . Cesarean section    . Breast surgery     Social History:  reports that she has been smoking Cigarettes.  She has been smoking about 0.00 packs per day. She does not have any smokeless tobacco history on file. She reports that she does not drink alcohol or use illicit drugs.   Allergies  Allergen Reactions  . Azithromycin     Unknown  . Morphine And Related     Can tolerate hydrocodone and dilaudid without side effects  . Nitrofurantoin     Unknown  . Penicillins Hives and Itching  . Sulfonamide Derivatives Hives    Family History  Problem Relation Age of Onset  . Cancer Mother 25    breast cancer  . Hypertension Sister   . Hypothyroidism Brother   . Cancer Maternal Grandmother 51    breast cancer  . Cancer Maternal Grandfather 21    pancreatic cancer    Prior to Admission medications   Medication Sig Start Date End Date Taking? Authorizing Provider  ALPRAZolam (XANAX) 1 MG tablet TAKE 1 TABLET (1MG  TOTAL) BY MOUTH AT BEDTIME  AS NEEDED FOR SLEEP 10/26/12  Yes Elvina Sidle, MD  clindamycin (CLEOCIN) 300 MG capsule Take 1 capsule (300 mg total) by mouth 3 (three) times daily. 11/06/12  Yes Aviva Signs, CNM  clonazePAM (KLONOPIN) 1 MG tablet Take 1 tablet (1 mg total) by mouth 2 (two) times daily as needed for anxiety. 03/08/12  Yes Elvina Sidle, MD  desvenlafaxine (PRISTIQ) 100 MG 24 hr tablet Take 1 tablet (100 mg total) by mouth daily. 03/08/12  Yes Elvina Sidle, MD  fluticasone [facility] 50 MCG/ACT nasal spray Place 2 sprays into the nose daily. 03/08/12 03/08/13 Yes Elvina Sidle, MD  HYDROcodone-acetaminophen (NORCO) 7.5-325 MG per tablet Take 1 tablet by mouth every 8 (eight) hours as needed  for pain. 08/28/12  Yes Elvina Sidle, MD  insulin aspart (NOVOLOG) 100 UNIT/ML injection Inject 30 Units into the skin 3 (three) times daily before meals.    Yes Historical Provider, MD  insulin glargine (LANTUS) 100 UNIT/ML injection Inject 20 Units into the skin 2 (two) times daily. 03/08/12  Yes Elvina Sidle, MD  metFORMIN (GLUCOPHAGE) 500 MG tablet Take 2 tablets (1,000 mg total) by mouth 2 (two) times daily with a meal. 08/28/12  Yes Elvina Sidle, MD  omeprazole (PRILOSEC) 20 MG capsule Take 1 capsule (20 mg total) by mouth daily. 08/21/12  Yes Elvina Sidle, MD  promethazine (PHENERGAN) 25 MG tablet Take 1 tablet (25 mg total) by mouth every 6 (six) hours as needed for nausea. 11/06/12  Yes Aviva Signs, CNM  albuterol (PROVENTIL HFA;VENTOLIN HFA) 108 (90 BASE) MCG/ACT inhaler Inhale 1 puff into the lungs every 6 (six) hours as needed for wheezing. 08/28/12 08/28/13  Elvina Sidle, MD  Blood Glucose Monitoring Suppl (ONE TOUCH ULTRA 2) W/DEVICE KIT USE AS DIRECTED 07/22/12   Shade Flood, MD  Zoledronic Acid (ZOMETA IV) Inject into the vein. Every 6 months    Historical Provider, MD   Physical Exam: Filed Vitals:   11/08/12 1127 11/08/12 1142 11/08/12 1306 11/08/12 1403  BP:  99/56 107/50 84/47  Pulse:  124 102 94  Temp:  102.6 F (39.2 C) 99.8 F (37.7 C) 99.5 F (37.5 C)  TempSrc:  Oral Oral Oral  Resp:   22 20  Height:    5\' 2"  (1.575 m)  Weight:    95.255 kg (210 lb)  SpO2: 95% 93% 96% 97%     General:  NAD  Eyes: PERRLA, EOMI  ENT: mucous membranes are dry  Neck: supple  Cardiovascular: s1, s2 tachycardic  Respiratory: crackles at right base  Abdomen: soft, tender in the left lower quadrant, bs+  Skin: large area of induration and draining abscess under pannus in left groin area, bloody discharge  Musculoskeletal: no pedal edema b/l  Psychiatric: normal affect, cooperative with exam  Neurologic: grossly intact, non focal  Labs on Admission:   Basic Metabolic Panel:  Recent Labs Lab 11/08/12 1144  NA 127*  K 3.6  CL 92*  CO2 24  GLUCOSE 278*  BUN 10  CREATININE 0.67  CALCIUM 8.9   Liver Function Tests: No results found for this basename: AST, ALT, ALKPHOS, BILITOT, PROT, ALBUMIN,  in the last 168 hours No results found for this basename: LIPASE, AMYLASE,  in the last 168 hours No results found for this basename: AMMONIA,  in the last 168 hours CBC:  Recent Labs Lab 11/08/12 1144  WBC 24.4*  NEUTROABS 20.1*  HGB 13.9  HCT 40.4  MCV 84.2  PLT 189  Cardiac Enzymes: No results found for this basename: CKTOTAL, CKMB, CKMBINDEX, TROPONINI,  in the last 168 hours  BNP (last 3 results) No results found for this basename: PROBNP,  in the last 8760 hours CBG: No results found for this basename: GLUCAP,  in the last 168 hours  Radiological Exams on Admission: Dg Chest 2 View  11/08/2012   *RADIOLOGY REPORT*  Clinical Data: Cough.  Fever.  CHEST - 2 VIEW  Comparison: Chest x-ray 09/25/2012.  Findings: There are ill-defined areas of interstitial prominence and subtle hazy airspace disease in the right mid to lower lung, which may represent early changes of bronchopneumonia. These are difficult to localize on the lateral projection.  The lungs are otherwise clear.  No pleural effusions.  No evidence of pulmonary edema.  Heart size is normal. The patient is rotated to the right on today's exam, resulting in distortion of the mediastinal contours and reduced diagnostic sensitivity and specificity for mediastinal pathology.  IMPRESSION: 1.  Findings, as above, concerning for early changes of bronchopneumonia in the right mid to lower lung.   Original Report Authenticated By: Trudie Reed, M.D.    Assessment/Plan Active Problems:   DM   HYPERTENSION   Sepsis   Community acquired pneumonia   Dehydration   Abscess of groin, left   1. Pneumonia.  Community acquired.  Patient has allergies to azithromycin and  penicillin.  Will use levaquin and continue with supportive measures. 2. Left groin abscess.  Will start the patient on vancomycin.  Culture has been sent by ED.  Will request surgery opinion.  Continue warm compressess 3. Sepsis due to 1 and 2.  Continue to treat with iv fluids and antibiotics.  Cultures have been sent and are pending. Lactic acid is reassuringly in normal range. 4. Diabetes.  Will continue lantus and novolog.  Supplement with sliding scale insulin 5. Hypertension, currently hypotensive.  Hold meds for now. 6. Hyponatremia.  Due to dehydration.  Will continue with if fluids.   Code Status: full code Family Communication: discussed with patient at the bedside  Disposition Plan: discharge home once improved  Time spent:  MEMON,JEHANZEB [facility] Pager [phone]  If 7PM-7AM, please contact night-coverage [email or web address] Password [facility] 11/08/2012, 2:18 PM

## 2012-11-08 NOTE — Progress Notes (Signed)
ANTIBIOTIC CONSULT NOTE  Pharmacy Consult for Vancomycin Indication: Cellulitis  Allergies  Allergen Reactions  . Azithromycin     Unknown  . Morphine And Related     Can tolerate hydrocodone and dilaudid without side effects  . Nitrofurantoin     Unknown  . Penicillins Hives and Itching  . Sulfonamide Derivatives Hives    Patient Measurements: Height: 5\' 2"  (157.5 cm) Weight: 210 lb (95.255 kg) IBW/kg (Calculated) : 50.1  Vital Signs: Temp: 99.5 F (37.5 C) (08/02 1403) Temp src: Oral (08/02 1403) BP: 84/47 mmHg (08/02 1403) Pulse Rate: 94 (08/02 1403) Intake/Output from previous day:   Intake/Output from this shift:    Labs:  Recent Labs  11/08/12 1144  WBC 24.4*  HGB 13.9  PLT 189  CREATININE 0.67   Estimated Creatinine Clearance: 100.6 ml/min (by C-G formula based on Cr of 0.67). No results found for this basename: VANCOTROUGH, Leodis Binet, VANCORANDOM, GENTTROUGH, GENTPEAK, GENTRANDOM, TOBRATROUGH, TOBRAPEAK, TOBRARND, AMIKACINPEAK, AMIKACINTROU, AMIKACIN,  in the last 72 hours   Microbiology: Recent Results (from the past 720 hour(s))  CULTURE, BLOOD (ROUTINE X 2)     Status: None   Collection Time    11/08/12 11:40 AM      Result Value Range Status   Specimen Description BLOOD BOTTLES DRAWN AEROBIC ONLY LEFT ANTECUBITAL   Final   Special Requests 8CC   Final   Culture PENDING   Incomplete   Report Status PENDING   Incomplete  CULTURE, BLOOD (ROUTINE X 2)     Status: None   Collection Time    11/08/12  1:25 PM      Result Value Range Status   Specimen Description     Final   Value: BLOOD BOTTLES DRAWN AEROBIC AND ANAEROBIC LEFT ANTECUBITAL   Special Requests [facility] EACH   Final   Culture PENDING   Incomplete   Report Status PENDING   Incomplete    Medical History: Past Medical History  Diagnosis Date  . Diabetes mellitus   . Back pain   . Osteopenia   . Allergy   . Asthma   . Cancer   . Breast cancer   . Depression   . Anxiety   .  Osteoporosis     osteopenia    Medications:  Scheduled:  . antiseptic oral rinse  15 mL Mouth Rinse BID  . enoxaparin (LOVENOX) injection  40 mg Subcutaneous Daily  . fluticasone  2 spray Each Nare Daily  . guaiFENesin  600 mg Oral BID  . insulin aspart  0-15 Units Subcutaneous TID WC  . insulin aspart  0-5 Units Subcutaneous QHS  . insulin aspart  30 Units Subcutaneous TID AC  . insulin glargine  20 Units Subcutaneous BID  . levofloxacin (LEVAQUIN) IV  750 mg Intravenous Q24H  . pantoprazole  40 mg Oral Daily  . sodium chloride  1,000 mL Intravenous Once  . sodium chloride  3 mL Intravenous Q12H  . vancomycin  1,000 mg Intravenous Once  . [START ON 11/09/2012] venlafaxine XR  150 mg Oral Q breakfast   Assessment: Okay for Protocol Vancomycin 1000mg  IV x 1 ordered in ED, administration pending.  Vancomycin 8/2 >> Levaquin 8/2 >>  Goal of Therapy:  Vancomycin trough level 10-15 mcg/ml  Plan:  Vancomycin 1000mg  IV every 8 hours. Measure antibiotic drug levels at steady state Follow up culture results  Mady Gemma 11/08/2012,2:44 PM

## 2012-11-08 NOTE — ED Notes (Signed)
Patient is asking for a blanket. Explained to patient that with a fever she can not have one. Patient is drinking ice water and this time. Family is at bedside.

## 2012-11-08 NOTE — ED Provider Notes (Signed)
CSN: [id number]     Arrival date & time 11/08/12  0941 History     First MD Initiated Contact with Patient 11/08/12 [phone]     Chief Complaint  Patient presents with  . Cough   (Consider location/radiation/quality/duration/timing/severity/associated sxs/prior Treatment) Patient is a 42 y.o. female presenting with cough. The history is provided by the patient.  Cough Cough characteristics:  Non-productive Severity:  Severe Onset quality:  Gradual Duration:  3 days Timing:  Intermittent Progression:  Worsening Chronicity:  New Smoker: yes   Context: upper respiratory infection and weather changes   Context: not sick contacts   Relieved by:  Nothing Worsened by:  Nothing tried Ineffective treatments:  None tried Associated symptoms: chills, myalgias, shortness of breath, sinus congestion and wheezing   Associated symptoms: no chest pain and no eye discharge     Past Medical History  Diagnosis Date  . Diabetes mellitus   . Back pain   . Osteopenia   . Allergy   . Asthma   . Cancer   . Breast cancer   . Depression   . Anxiety   . Osteoporosis     osteopenia   Past Surgical History  Procedure Laterality Date  . Mastectomy    . Abdominal hysterectomy    . Lymphadenectomy    . Cesarean section    . Breast surgery     Family History  Problem Relation Age of Onset  . Cancer Mother 39    breast cancer  . Hypertension Sister   . Hypothyroidism Brother   . Cancer Maternal Grandmother 48    breast cancer  . Cancer Maternal Grandfather 55    pancreatic cancer   History  Substance Use Topics  . Smoking status: Current Some Day Smoker    Types: Cigarettes  . Smokeless tobacco: Not on file  . Alcohol Use: No   OB History   Grav Para Term Preterm Abortions TAB SAB Ect Mult Living                 Review of Systems  Constitutional: Positive for chills. Negative for activity change.       All ROS Neg except as noted in HPI  HENT: Negative for nosebleeds and neck  pain.   Eyes: Negative for photophobia and discharge.  Respiratory: Positive for cough, shortness of breath and wheezing.   Cardiovascular: Negative for chest pain and palpitations.  Gastrointestinal: Negative for abdominal pain and blood in stool.  Genitourinary: Negative for dysuria, frequency and hematuria.  Musculoskeletal: Positive for myalgias and back pain. Negative for arthralgias.  Skin: Negative.        abscess  Neurological: Negative for dizziness, seizures and speech difficulty.  Psychiatric/Behavioral: Negative for hallucinations and confusion.    Allergies  Azithromycin; Morphine and related; Nitrofurantoin; Penicillins; and Sulfonamide derivatives  Home Medications   Current Outpatient Rx  Name  Route  Sig  Dispense  Refill  . ALPRAZolam (XANAX) 1 MG tablet      TAKE 1 TABLET (1MG  TOTAL) BY MOUTH AT BEDTIME AS NEEDED FOR SLEEP   90 tablet   0     CYCLE FILL MEDICATION. Authorization is required f ...   . clindamycin (CLEOCIN) 300 MG capsule   Oral   Take 1 capsule (300 mg total) by mouth 3 (three) times daily.   30 capsule   0   . clonazePAM (KLONOPIN) 1 MG tablet   Oral   Take 1 tablet (1 mg total) by mouth 2 (  two) times daily as needed for anxiety.   180 tablet   1     Needs office visit   . desvenlafaxine (PRISTIQ) 100 MG 24 hr tablet   Oral   Take 1 tablet (100 mg total) by mouth daily.   90 tablet   3   . fluticasone (FLONASE) 50 MCG/ACT nasal spray   Nasal   Place 2 sprays into the nose daily.   1 g   12   . HYDROcodone-acetaminophen (NORCO) 7.5-325 MG per tablet   Oral   Take 1 tablet by mouth every 8 (eight) hours as needed for pain.   90 tablet   3   . insulin aspart (NOVOLOG) 100 UNIT/ML injection   Subcutaneous   Inject 30 Units into the skin 3 (three) times daily before meals.          . insulin glargine (LANTUS) 100 UNIT/ML injection   Subcutaneous   Inject 20 Units into the skin 2 (two) times daily.   10 mL   11   .  metFORMIN (GLUCOPHAGE) 500 MG tablet   Oral   Take 2 tablets (1,000 mg total) by mouth 2 (two) times daily with a meal.   240 tablet   3   . omeprazole (PRILOSEC) 20 MG capsule   Oral   Take 1 capsule (20 mg total) by mouth daily.   30 capsule   1     PATIENT NEEDS OFFICE VISIT FOR ADDITIONAL REFILLS   . promethazine (PHENERGAN) 25 MG tablet   Oral   Take 1 tablet (25 mg total) by mouth every 6 (six) hours as needed for nausea.   30 tablet   0   . albuterol (PROVENTIL HFA;VENTOLIN HFA) 108 (90 BASE) MCG/ACT inhaler   Inhalation   Inhale 1 puff into the lungs every 6 (six) hours as needed for wheezing.   1 Inhaler   10   . Blood Glucose Monitoring Suppl (ONE TOUCH ULTRA 2) W/DEVICE KIT      USE AS DIRECTED   1 each   0     No refills available. PT LOST MACHINE.   Marland Kitchen Zoledronic Acid (ZOMETA IV)   Intravenous   Inject into the vein. Every 6 months          BP 97/81  Pulse 115  Temp(Src) 101.1 F (38.4 C) (Oral)  Resp 24  Ht 5\' 2"  (1.575 m)  Wt 210 lb (95.255 kg)  BMI 38.4 kg/m2  SpO2 93% Physical Exam  Nursing note and vitals reviewed. Constitutional: She is oriented to person, place, and time. She appears well-developed and well-nourished.  Non-toxic appearance.  HENT:  Head: Normocephalic.  Right Ear: Tympanic membrane and external ear normal.  Left Ear: Tympanic membrane and external ear normal.  Eyes: EOM and lids are normal. Pupils are equal, round, and reactive to light.  Neck: Normal range of motion. Neck supple. Carotid bruit is not present.  Cardiovascular: Regular rhythm, normal heart sounds, intact distal pulses and normal pulses.  Tachycardia present.   Pulmonary/Chest: Breath sounds normal. No respiratory distress.  A slight decrease breath sounds at the right base. There are rhonchi present on the right, more than the left. There is symmetrical rise and fall of the chest.  There is soreness to palpation of the right chest wall. Patient speaks  in her speech.  Abdominal: Soft. Bowel sounds are normal. There is no tenderness. There is no guarding.  Genitourinary:  Chaperone present during exam. Pt has  at warm, painful 18x5cm abscess with foul smelling drainage of the left groin.  Musculoskeletal: Normal range of motion.  Lymphadenopathy:       Head (right side): No submandibular adenopathy present.       Head (left side): No submandibular adenopathy present.    She has no cervical adenopathy.  Neurological: She is alert and oriented to person, place, and time. She has normal strength. No cranial nerve deficit or sensory deficit.  Skin: Skin is warm and dry.  Psychiatric: She has a normal mood and affect. Her speech is normal.    ED Course   Procedures (including critical care time)  Labs Reviewed - No data to display Dg Chest 2 View  11/08/2012   *RADIOLOGY REPORT*  Clinical Data: Cough.  Fever.  CHEST - 2 VIEW  Comparison: Chest x-ray 09/25/2012.  Findings: There are ill-defined areas of interstitial prominence and subtle hazy airspace disease in the right mid to lower lung, which may represent early changes of bronchopneumonia. These are difficult to localize on the lateral projection.  The lungs are otherwise clear.  No pleural effusions.  No evidence of pulmonary edema.  Heart size is normal. The patient is rotated to the right on today's exam, resulting in distortion of the mediastinal contours and reduced diagnostic sensitivity and specificity for mediastinal pathology.  IMPRESSION: 1.  Findings, as above, concerning for early changes of bronchopneumonia in the right mid to lower lung.   Original Report Authenticated By: Trudie Reed, M.D.   No diagnosis found.  MDM  *I have reviewed nursing notes, vital signs, and all appropriate lab and imaging results for this patient.** Chest xray reveals right mid and lower lung pneumonia. WBC 24.4, no reported bands. Bmet reveals a low sodium of 127, and glucose elevated at 278. Pt  recently finished clindamycin for an abscess in the groin area. Pt continues to have a 18x5cm abscess with drainage present.  Pt seen with me by Dr Fonnie Jarvis. Pt to admitted by [facility] for pneumonia and left groin abscess.  Kathie Dike, PA-C 11/08/12 1322

## 2012-11-08 NOTE — ED Notes (Signed)
Complain of non productive cough that started yesterday. Also, a fever and is being treated for an abscess on her left thigh

## 2012-11-08 NOTE — ED Provider Notes (Signed)
Medical screening examination/treatment/procedure(s) were performed by non-physician practitioner and as supervising physician I was immediately available for consultation/collaboration.   Rajvi Armentor M Michille Mcelrath, MD 11/08/12 1931 

## 2012-11-09 LAB — GLUCOSE, CAPILLARY
Glucose-Capillary: 231 mg/dL — ABNORMAL HIGH (ref 70–99)
Glucose-Capillary: 227 mg/dL — ABNORMAL HIGH (ref 70–99)
Glucose-Capillary: 196 mg/dL — ABNORMAL HIGH (ref 70–99)

## 2012-11-09 LAB — BASIC METABOLIC PANEL
BUN: 7 mg/dL (ref 6–23)
Calcium: 8.2 mg/dL — ABNORMAL LOW (ref 8.4–10.5)
GFR calc non Af Amer: >90 mL/min (ref >90)
Glucose, Bld: 213 mg/dL — ABNORMAL HIGH (ref 70–99)
Sodium: 130 mEq/L — ABNORMAL LOW (ref 135–145)

## 2012-11-09 LAB — CBC
MCH: 28.5 pg (ref 26.0–34.0)
MCHC: 33.7 g/dL (ref 30.0–36.0)
Platelets: 177 10*3/uL (ref 150–400)

## 2012-11-09 MED ORDER — INSULIN ASPART 100 UNIT/ML ~~LOC~~ SOLN
10.0000 [IU] | Freq: Once | SUBCUTANEOUS | Status: AC
  Administered 2012-11-09: 10 [IU] via SUBCUTANEOUS

## 2012-11-09 NOTE — Consult Note (Signed)
Reason for Consult: Left groin abscess Referring Physician: [facility]  Ann Lopez is an 42 y.o. female.  HPI: Patient is a 42 year old morbidly obese white female multiple medical problems who has had cellulitis in the left groin for approximately one month. It started as an infected small pimple, but has since worsened. It started draining yesterday evening. She states that this is her first episode. Oral antibiotics have not been helpful.  Past Medical History  Diagnosis Date  . Diabetes mellitus   . Back pain   . Osteopenia   . Allergy   . Asthma   . Cancer   . Breast cancer   . Depression   . Anxiety   . Osteoporosis     osteopenia    Past Surgical History  Procedure Laterality Date  . Mastectomy    . Abdominal hysterectomy    . Lymphadenectomy    . Cesarean section    . Breast surgery      Family History  Problem Relation Age of Onset  . Cancer Mother 50    breast cancer  . Hypertension Sister   . Hypothyroidism Brother   . Cancer Maternal Grandmother 15    breast cancer  . Cancer Maternal Grandfather 43    pancreatic cancer    Social History:  reports that she has been smoking Cigarettes.  She has been smoking about 0.00 packs per day. She does not have any smokeless tobacco history on file. She reports that she does not drink alcohol or use illicit drugs.  Allergies:  Allergies  Allergen Reactions  . Azithromycin     Unknown  . Morphine And Related     Can tolerate hydrocodone and dilaudid without side effects  . Nitrofurantoin     Unknown  . Penicillins Hives and Itching  . Sulfonamide Derivatives Hives    Medications: I have reviewed the patient's current medications.  Results for orders placed during the hospital encounter of 11/08/12 (from the past 48 hour(s))  CULTURE, BLOOD (ROUTINE X 2)     Status: None   Collection Time    11/08/12 11:40 AM      Result Value Range   Specimen Description BLOOD BOTTLES DRAWN AEROBIC ONLY LEFT  ANTECUBITAL     Special Requests 8CC     Culture PENDING     Report Status PENDING    CBC WITH DIFFERENTIAL     Status: Abnormal   Collection Time    11/08/12 11:44 AM      Result Value Range   WBC 24.4 (*) 4.0 - 10.5 K/uL   RBC 4.80  3.87 - 5.11 MIL/uL   Hemoglobin 13.9  12.0 - 15.0 g/dL   HCT 72.5  36.6 - 44.0 %   MCV 84.2  78.0 - 100.0 fL   MCH 29.0  26.0 - 34.0 pg   MCHC 34.4  30.0 - 36.0 g/dL   RDW 34.7  42.5 - 95.6 %   Platelets 189  150 - 400 K/uL   Neutrophils Relative % 83 (*) 43 - 77 %   Neutro Abs 20.1 (*) 1.7 - 7.7 K/uL   Lymphocytes Relative 10 (*) 12 - 46 %   Lymphs Abs 2.4  0.7 - 4.0 K/uL   Monocytes Relative 8  3 - 12 %   Monocytes Absolute 1.9 (*) 0.1 - 1.0 K/uL   Eosinophils Relative 0  0 - 5 %   Eosinophils Absolute 0.0  0.0 - 0.7 K/uL   Basophils Relative  0  0 - 1 %   Basophils Absolute 0.1  0.0 - 0.1 K/uL  BASIC METABOLIC PANEL     Status: Abnormal   Collection Time    11/08/12 11:44 AM      Result Value Range   Sodium 127 (*) 135 - 145 mEq/L   Potassium 3.6  3.5 - 5.1 mEq/L   Chloride 92 (*) 96 - 112 mEq/L   CO2 24  19 - 32 mEq/L   Glucose, Bld 278 (*) 70 - 99 mg/dL   BUN 10  6 - 23 mg/dL   Creatinine, Ser 1.61  0.50 - 1.10 mg/dL   Calcium 8.9  8.4 - 09.6 mg/dL   GFR calc non Af Amer >90  >90 mL/min   GFR calc Af Amer >90  >90 mL/min   Comment:            The eGFR has been calculated     using the CKD EPI equation.     This calculation has not been     validated in all clinical     situations.     eGFR's persistently     <90 mL/min signify     possible Chronic Kidney Disease.  CULTURE, ROUTINE-ABSCESS     Status: None   Collection Time    11/08/12  1:15 PM      Result Value Range   Specimen Description OTHER     Special Requests Normal     Gram Stain PENDING     Culture NO GROWTH 1 DAY     Report Status PENDING    LACTIC ACID, PLASMA     Status: None   Collection Time    11/08/12  1:24 PM      Result Value Range   Lactic Acid,  Venous 1.7  0.5 - 2.2 mmol/L  CULTURE, BLOOD (ROUTINE X 2)     Status: None   Collection Time    11/08/12  1:25 PM      Result Value Range   Specimen Description       Value: BLOOD BOTTLES DRAWN AEROBIC AND ANAEROBIC LEFT ANTECUBITAL   Special Requests 8CC EACH     Culture PENDING     Report Status PENDING    HEMOGLOBIN A1C     Status: Abnormal   Collection Time    11/08/12  2:26 PM      Result Value Range   Hemoglobin A1C 9.2 (*) <5.7 %   Comment: (NOTE)                                                                               According to the ADA Clinical Practice Recommendations for 2011, when     HbA1c is used as a screening test:      >=6.5%   Diagnostic of Diabetes Mellitus               (if abnormal result is confirmed)     5.7-6.4%   Increased risk of developing Diabetes Mellitus     References:Diagnosis and Classification of Diabetes Mellitus,Diabetes     Care,2011,34(Suppl 1):S62-S69 and Standards of Medical Care in  Diabetes - 2011,Diabetes Care,2011,34 (Suppl 1):S11-S61.   Mean Plasma Glucose 217 (*) <117 mg/dL  GLUCOSE, CAPILLARY     Status: Abnormal   Collection Time    11/08/12  4:33 PM      Result Value Range   Glucose-Capillary 203 (*) 70 - 99 mg/dL   Comment 1 Notify RN     Comment 2 Documented in Chart    GLUCOSE, CAPILLARY     Status: Abnormal   Collection Time    11/08/12  8:46 PM      Result Value Range   Glucose-Capillary 109 (*) 70 - 99 mg/dL   Comment 1 Notify RN    GLUCOSE, CAPILLARY     Status: Abnormal   Collection Time    11/09/12  4:34 AM      Result Value Range   Glucose-Capillary 218 (*) 70 - 99 mg/dL   Comment 1 Notify RN    BASIC METABOLIC PANEL     Status: Abnormal   Collection Time    11/09/12  6:16 AM      Result Value Range   Sodium 130 (*) 135 - 145 mEq/L   Potassium 4.1  3.5 - 5.1 mEq/L   Chloride 96  96 - 112 mEq/L   CO2 26  19 - 32 mEq/L   Glucose, Bld 213 (*) 70 - 99 mg/dL   BUN 7  6 - 23 mg/dL    Creatinine, Ser 4.09  0.50 - 1.10 mg/dL   Calcium 8.2 (*) 8.4 - 10.5 mg/dL   GFR calc non Af Amer >90  >90 mL/min   GFR calc Af Amer >90  >90 mL/min   Comment:            The eGFR has been calculated     using the CKD EPI equation.     This calculation has not been     validated in all clinical     situations.     eGFR's persistently     <90 mL/min signify     possible Chronic Kidney Disease.  CBC     Status: Abnormal   Collection Time    11/09/12  6:16 AM      Result Value Range   WBC 20.0 (*) 4.0 - 10.5 K/uL   RBC 4.03  3.87 - 5.11 MIL/uL   Hemoglobin 11.5 (*) 12.0 - 15.0 g/dL   Comment: DELTA CHECK NOTED     RESULT REPEATED AND VERIFIED   HCT 34.1 (*) 36.0 - 46.0 %   MCV 84.6  78.0 - 100.0 fL   MCH 28.5  26.0 - 34.0 pg   MCHC 33.7  30.0 - 36.0 g/dL   RDW 81.1  91.4 - 78.2 %   Platelets 177  150 - 400 K/uL  GLUCOSE, CAPILLARY     Status: Abnormal   Collection Time    11/09/12  7:25 AM      Result Value Range   Glucose-Capillary 231 (*) 70 - 99 mg/dL   Comment 1 Documented in Chart     Comment 2 Notify RN      Dg Chest 2 View  11/08/2012   *RADIOLOGY REPORT*  Clinical Data: Cough.  Fever.  CHEST - 2 VIEW  Comparison: Chest x-ray 09/25/2012.  Findings: There are ill-defined areas of interstitial prominence and subtle hazy airspace disease in the right mid to lower lung, which may represent early changes of bronchopneumonia. These are difficult to localize on the lateral projection.  The lungs  are otherwise clear.  No pleural effusions.  No evidence of pulmonary edema.  Heart size is normal. The patient is rotated to the right on today's exam, resulting in distortion of the mediastinal contours and reduced diagnostic sensitivity and specificity for mediastinal pathology.  IMPRESSION: 1.  Findings, as above, concerning for early changes of bronchopneumonia in the right mid to lower lung.   Original Report Authenticated By: Trudie Reed, M.D.    ROS: See chart Blood pressure  84/57, pulse 79, temperature 98 F (36.7 C), temperature source Oral, resp. rate 20, height 5\' 2"  (1.575 m), weight 95.255 kg (210 lb), SpO2 93.00%. Physical Exam: Morbidly obese white female in no acute distress. Left groin with an indurated, erythematous band extending medial to lateral along the left groin crease. A 1.5 cm fluctuant area is draining. No other fluctuant areas noted.  Assessment/Plan: Impression: Left groin abscess, draining. Initial cultures negative. No need for acute surgical intervention as the wound is draining. She also has a developing right-sided pneumonia which could also explain her leukocytosis, which is slowly improving. Would continue IV vancomycin as ordered. Wound care instructions have been given. We'll follow with you.  Christen Bedoya A 11/09/2012, 8:47 AM

## 2012-11-09 NOTE — MAU Provider Note (Signed)
Attestation of Attending Supervision of Advanced Practitioner (PA/CNM/NP): Evaluation and management procedures were performed by the Advanced Practitioner under my supervision and collaboration.  I have reviewed the Advanced Practitioner's note and chart, and I agree with the management and plan.  Oliviya Gilkison, MD, FACOG Attending Obstetrician & Gynecologist Faculty Practice, [facility]  

## 2012-11-09 NOTE — Progress Notes (Signed)
[facility] PROGRESS NOTE  Ann Lopez:[id number] DOB: June 12, 1970 DOA: 11/08/2012 PCP: Elvina Sidle, MD  Assessment/Plan: Pneumonia. Community acquired. Patient has allergies to azithromycin and penicillin. She is on levaquin. Will continue with supportive measures.   Left groin abscess. Patient on vancomycin. Culture has been sent by ED. General surgery assistance appreciated. Continue warm compressess   Sepsis due to 1 and 2. Blood pressure still on lower side, but clinically stable. Continue to treat with iv fluids and antibiotics. Cultures have been sent and are pending. Lactic acid is reassuringly in normal range.   Diabetes. Will continue lantus and novolog. Supplement with sliding scale insulin   Hypertension, currently hypotensive. Hold meds for now.   Hyponatremia. Due to dehydration. Will continue with iv fluids.  Left flank pain.  Patient is complaining of pain in flank and dark urine.  Will check urinalysis.   Code Status: full code Family Communication: discussed with patient Disposition Plan: discharge home once improved   Consultants:  General surgery  Procedures:  none  Antibiotics:  Vancomycin 8/2  Levofloxacin 8/2  HPI/Subjective: Has significant coughing, right chest pain with cough, has pain in left groin due to abscess.  Reports urine is very dark.  Objective: Filed Vitals:   11/09/12 0032 11/09/12 0309 11/09/12 0610 11/09/12 0754  BP: 98/64  84/57   Pulse: 86  79   Temp: 99 F (37.2 C)  98 F (36.7 C)   TempSrc: Oral  Oral   Resp: 20  20   Height:      Weight:      SpO2: 95% 96% 95% 93%    Intake/Output Summary (Last 24 hours) at 11/09/12 1213 Last data filed at 11/08/12 1700  Gross per 24 hour  Intake    240 ml  Output      0 ml  Net    240 ml   Filed Weights   11/08/12 0953 11/08/12 1403  Weight: 95.255 kg (210 lb) 95.255 kg (210 lb)    Exam:   General:  NAD  Cardiovascular: s1, s2, rrr  Respiratory:  crackles at right base  Abdomen: soft, tender in left lower quadrant  Musculoskeletal: left groin erythematous area with extensive induration and a draining area noted.     Data Reviewed: Basic Metabolic Panel:  Recent Labs Lab 11/08/12 1144 11/09/12 0616  NA 127* 130*  K 3.6 4.1  CL 92* 96  CO2 24 26  GLUCOSE 278* 213*  BUN 10 7  CREATININE 0.67 0.51  CALCIUM 8.9 8.2*   Liver Function Tests: No results found for this basename: AST, ALT, ALKPHOS, BILITOT, PROT, ALBUMIN,  in the last 168 hours No results found for this basename: LIPASE, AMYLASE,  in the last 168 hours No results found for this basename: AMMONIA,  in the last 168 hours CBC:  Recent Labs Lab 11/08/12 1144 11/09/12 0616  WBC 24.4* 20.0*  NEUTROABS 20.1*  --   HGB 13.9 11.5*  HCT 40.4 34.1*  MCV 84.2 84.6  PLT 189 177   Cardiac Enzymes: No results found for this basename: CKTOTAL, CKMB, CKMBINDEX, TROPONINI,  in the last 168 hours BNP (last 3 results) No results found for this basename: PROBNP,  in the last 8760 hours CBG:  Recent Labs Lab 11/08/12 2046 11/09/12 0434 11/09/12 0725 11/09/12 1025 11/09/12 1148  GLUCAP 109* 218* 231* 227* 201*    Recent Results (from the past 240 hour(s))  CULTURE, BLOOD (ROUTINE X 2)     Status: None  Collection Time    11/08/12 11:40 AM      Result Value Range Status   Specimen Description BLOOD BOTTLES DRAWN AEROBIC ONLY LEFT ANTECUBITAL   Final   Special Requests [facility]   Final   Culture PENDING   Incomplete   Report Status PENDING   Incomplete  CULTURE, ROUTINE-ABSCESS     Status: None   Collection Time    11/08/12  1:15 PM      Result Value Range Status   Specimen Description OTHER   Final   Special Requests Normal   Final   Gram Stain PENDING   Incomplete   Culture NO GROWTH 1 DAY   Final   Report Status PENDING   Incomplete  CULTURE, BLOOD (ROUTINE X 2)     Status: None   Collection Time    11/08/12  1:25 PM      Result Value Range Status    Specimen Description     Final   Value: BLOOD BOTTLES DRAWN AEROBIC AND ANAEROBIC LEFT ANTECUBITAL   Special Requests [facility]   Final   Culture PENDING   Incomplete   Report Status PENDING   Incomplete     Studies: Dg Chest 2 View  11/08/2012   *RADIOLOGY REPORT*  Clinical Data: Cough.  Fever.  CHEST - 2 VIEW  Comparison: Chest x-ray 09/25/2012.  Findings: There are ill-defined areas of interstitial prominence and subtle hazy airspace disease in the right mid to lower lung, which may represent early changes of bronchopneumonia. These are difficult to localize on the lateral projection.  The lungs are otherwise clear.  No pleural effusions.  No evidence of pulmonary edema.  Heart size is normal. The patient is rotated to the right on today's exam, resulting in distortion of the mediastinal contours and reduced diagnostic sensitivity and specificity for mediastinal pathology.  IMPRESSION: 1.  Findings, as above, concerning for early changes of bronchopneumonia in the right mid to lower lung.   Original Report Authenticated By: Trudie Reed, M.D.    Scheduled Meds: . antiseptic oral rinse  15 mL Mouth Rinse BID  . enoxaparin (LOVENOX) injection  40 mg Subcutaneous Daily  . fluticasone  2 spray Each Nare Daily  . guaiFENesin  600 mg Oral BID  . insulin aspart  0-15 Units Subcutaneous TID WC  . insulin aspart  0-5 Units Subcutaneous QHS  . insulin aspart  30 Units Subcutaneous TID AC  . insulin glargine  20 Units Subcutaneous BID  . levofloxacin (LEVAQUIN) IV  750 mg Intravenous Q24H  . pantoprazole  40 mg Oral Daily  . sodium chloride  3 mL Intravenous Q12H  . vancomycin  1,000 mg Intravenous Q8H  . venlafaxine XR  150 mg Oral Q breakfast   Continuous Infusions: . sodium chloride 125 mL/hr (11/08/12 1452)  . sodium chloride 125 mL/hr at 11/09/12 1028    Active Problems:   DM   HYPERTENSION   Sepsis   Community acquired pneumonia   Dehydration   Abscess of groin, left    Time  spent:    MEMON,JEHANZEB  [facility] Pager [phone]. If 7PM-7AM, please contact night-coverage at [email or web address], password [facility] 11/09/2012, 12:13 PM  LOS: 1 day

## 2012-11-10 LAB — BASIC METABOLIC PANEL
BUN: 5 mg/dL — ABNORMAL LOW (ref 6–23)
CO2: 27 mEq/L (ref 19–32)
Chloride: 97 mEq/L (ref 96–112)
Creatinine, Ser: 0.53 mg/dL (ref 0.50–1.10)

## 2012-11-10 LAB — CBC
HCT: 32.4 % — ABNORMAL LOW (ref 36.0–46.0)
MCV: 84.4 fL (ref 78.0–100.0)
RDW: 14 % (ref 11.5–15.5)
WBC: 16.5 10*3/uL — ABNORMAL HIGH (ref 4.0–10.5)

## 2012-11-10 LAB — URINALYSIS, ROUTINE W REFLEX MICROSCOPIC
Hgb urine dipstick: NEGATIVE
Ketones, ur: NEGATIVE mg/dL
Specific Gravity, Urine: >1.03 — ABNORMAL HIGH (ref 1.005–1.030)
Urobilinogen, UA: 4 mg/dL — ABNORMAL HIGH (ref 0.0–1.0)

## 2012-11-10 LAB — GLUCOSE, CAPILLARY
Glucose-Capillary: 183 mg/dL — ABNORMAL HIGH (ref 70–99)
Glucose-Capillary: 94 mg/dL (ref 70–99)

## 2012-11-10 MED ORDER — POTASSIUM CHLORIDE CRYS ER 20 MEQ PO TBCR
40.0000 meq | EXTENDED_RELEASE_TABLET | Freq: Once | ORAL | Status: AC
  Administered 2012-11-10: 40 meq via ORAL
  Filled 2012-11-10: qty 2

## 2012-11-10 MED ORDER — VANCOMYCIN HCL 10 G IV SOLR
1250.0000 mg | Freq: Three times a day (TID) | INTRAVENOUS | Status: DC
  Administered 2012-11-10 – 2012-11-17 (×21): 1250 mg via INTRAVENOUS
  Filled 2012-11-10 (×24): qty 1250

## 2012-11-10 NOTE — Progress Notes (Signed)
UR Chart Review Completed  

## 2012-11-10 NOTE — Progress Notes (Signed)
ANTIBIOTIC CONSULT NOTE  Pharmacy Consult for Vancomycin Indication: Cellulitis  Allergies  Allergen Reactions  . Azithromycin     Unknown  . Morphine And Related     Can tolerate hydrocodone and dilaudid without side effects  . Nitrofurantoin     Unknown  . Penicillins Hives and Itching  . Sulfonamide Derivatives Hives   Patient Measurements: Height: 5\' 2"  (157.5 cm) Weight: 210 lb (95.255 kg) IBW/kg (Calculated) : 50.1  Vital Signs: Temp: 99.1 F (37.3 C) (08/04 0559) Temp src: Oral (08/04 0559) BP: 89/59 mmHg (08/04 0559) Pulse Rate: 89 (08/04 0559) Intake/Output from previous day: 08/03 0701 - 08/04 0700 In: 120 [P.O.:120] Out: -  Intake/Output from this shift:    Labs:  Recent Labs  11/08/12 1144 11/09/12 0616 11/10/12 0549  WBC 24.4* 20.0* 16.5*  HGB 13.9 11.5* 10.8*  PLT 189 177 191  CREATININE 0.67 0.51 0.53   Estimated Creatinine Clearance: 100.6 ml/min (by C-G formula based on Cr of 0.53).  Recent Labs  11/10/12 0549  VANCOTROUGH 7.9*    Microbiology: Recent Results (from the past 720 hour(s))  CULTURE, BLOOD (ROUTINE X 2)     Status: None   Collection Time    11/08/12 11:40 AM      Result Value Range Status   Specimen Description BLOOD BOTTLES DRAWN AEROBIC ONLY LEFT ANTECUBITAL   Final   Special Requests [facility]   Final   Culture PENDING   Incomplete   Report Status PENDING   Incomplete  CULTURE, ROUTINE-ABSCESS     Status: None   Collection Time    11/08/12  1:15 PM      Result Value Range Status   Specimen Description OTHER   Final   Special Requests Normal   Final   Gram Stain PENDING   Incomplete   Culture MULTIPLE ORGANISMS PRESENT, NONE PREDOMINANT   Final   Report Status PENDING   Incomplete  CULTURE, BLOOD (ROUTINE X 2)     Status: None   Collection Time    11/08/12  1:25 PM      Result Value Range Status   Specimen Description     Final   Value: BLOOD BOTTLES DRAWN AEROBIC AND ANAEROBIC LEFT ANTECUBITAL   Special Requests  [facility] EACH   Final   Culture PENDING   Incomplete   Report Status PENDING   Incomplete   Medical History: Past Medical History  Diagnosis Date  . Diabetes mellitus   . Back pain   . Osteopenia   . Allergy   . Asthma   . Cancer   . Breast cancer   . Depression   . Anxiety   . Osteoporosis     osteopenia   Medications:  Scheduled:  . antiseptic oral rinse  15 mL Mouth Rinse BID  . enoxaparin (LOVENOX) injection  40 mg Subcutaneous Daily  . fluticasone  2 spray Each Nare Daily  . guaiFENesin  600 mg Oral BID  . insulin aspart  0-15 Units Subcutaneous TID WC  . insulin aspart  0-5 Units Subcutaneous QHS  . insulin aspart  30 Units Subcutaneous TID AC  . insulin glargine  20 Units Subcutaneous BID  . levofloxacin (LEVAQUIN) IV  750 mg Intravenous Q24H  . pantoprazole  40 mg Oral Daily  . sodium chloride  3 mL Intravenous Q12H  . vancomycin  1,000 mg Intravenous Q8H  . venlafaxine XR  150 mg Oral Q breakfast   Assessment: 41yo female on Vancomycin 1gm IV q8hrs  for cellulitis.  Renal fxn is stable.  Trough level is below goal.  Pt has excellent Vancomycin clearance.  Cultures non-contributory.  Vancomycin 8/2 >> Levaquin 8/2 >>  Goal of Therapy:  Vancomycin trough level 10-15 mcg/ml  Plan:  Increase Vancomycin to 1250mg  IV every 8 hours. Measure antibiotic drug levels at steady state Follow up culture results Duration of therapy per MD  Valrie Hart A 11/10/2012,7:49 AM

## 2012-11-10 NOTE — Progress Notes (Signed)
Notified Dr. Kerry Hough that the patient refuses to have her PICC line placed.

## 2012-11-10 NOTE — Progress Notes (Signed)
[facility] PROGRESS NOTE  Ann Lopez:[id number] DOB: 11-15-1970 DOA: 11/08/2012 PCP: Elvina Sidle, MD  Assessment/Plan: Pneumonia. Community acquired. Patient has allergies to azithromycin and penicillin. She is on levaquin. Will continue with supportive measures. She is beginning to produce sputum.  Left groin abscess. Patient on vancomycin. Culture has been sent by ED. General surgery assistance appreciated. Continue warm compressess   Sepsis due to 1 and 2. Blood pressure still on lower side, but clinically stable. Continue to treat with iv fluids and antibiotics. Cultures have been sent and are pending. Lactic acid is reassuringly in normal range.   Diabetes. Will continue lantus and novolog. Supplement with sliding scale insulin   Hypertension, currently hypotensive. Hold meds for now. Patient reports that her blood pressure normally runs on the lower side and on average is 90/50  Hyponatremia. Due to dehydration. Will continue with iv fluids.  Hypokalemia, replace  IV access. Patient had lost IV access in her right arm yesterday.  New IV was placed in the left arm.  Since patient had mastectomy with lymph node dissection on the left, will try to move IV back to right side. Patient was offered PICC line, but has declined.  She does not state the reason why she does not want a PICC line.  Code Status: full code Family Communication: discussed with patient Disposition Plan: discharge home once improved   Consultants:  General surgery  Procedures:  none  Antibiotics:  Vancomycin 8/2  Levofloxacin 8/2  HPI/Subjective: Has significant coughing, right chest pain with cough, has pain in left groin due to abscess.  Reports urine is very dark.  Objective: Filed Vitals:   11/10/12 0354 11/10/12 0559 11/10/12 0856 11/10/12 1207  BP:  89/59 88/56   Pulse:  89 90   Temp:  99.1 F (37.3 C)    TempSrc:  Oral    Resp:  20    Height:      Weight:      SpO2:  92% 94%  95%    Intake/Output Summary (Last 24 hours) at 11/10/12 1359 Last data filed at 11/09/12 1800  Gross per 24 hour  Intake    120 ml  Output      0 ml  Net    120 ml   Filed Weights   11/08/12 0953 11/08/12 1403  Weight: 95.255 kg (210 lb) 95.255 kg (210 lb)    Exam:   General:  NAD  Cardiovascular: s1, s2, rrr  Respiratory: crackles at right base  Abdomen: soft, tender in left lower quadrant  Musculoskeletal: left groin erythematous area with extensive induration and a draining area noted.     Data Reviewed: Basic Metabolic Panel:  Recent Labs Lab 11/08/12 1144 11/09/12 0616 11/10/12 0549  NA 127* 130* 133*  K 3.6 4.1 3.3*  CL 92* 96 97  CO2 24 26 27   GLUCOSE 278* 213* 179*  BUN 10 7 5*  CREATININE 0.67 0.51 0.53  CALCIUM 8.9 8.2* 8.4   Liver Function Tests: No results found for this basename: AST, ALT, ALKPHOS, BILITOT, PROT, ALBUMIN,  in the last 168 hours No results found for this basename: LIPASE, AMYLASE,  in the last 168 hours No results found for this basename: AMMONIA,  in the last 168 hours CBC:  Recent Labs Lab 11/08/12 1144 11/09/12 0616 11/10/12 0549  WBC 24.4* 20.0* 16.5*  NEUTROABS 20.1*  --   --   HGB 13.9 11.5* 10.8*  HCT 40.4 34.1* 32.4*  MCV 84.2 84.6 84.4  PLT 189 177 191   Cardiac Enzymes: No results found for this basename: CKTOTAL, CKMB, CKMBINDEX, TROPONINI,  in the last 168 hours BNP (last 3 results) No results found for this basename: PROBNP,  in the last 8760 hours CBG:  Recent Labs Lab 11/09/12 1148 11/09/12 1706 11/09/12 2018 11/10/12 0744 11/10/12 1136  GLUCAP 201* 180* 196* 183* 94    Recent Results (from the past 240 hour(s))  CULTURE, BLOOD (ROUTINE X 2)     Status: None   Collection Time    11/08/12 11:40 AM      Result Value Range Status   Specimen Description BLOOD BOTTLES DRAWN AEROBIC ONLY LEFT ANTECUBITAL   Final   Special Requests 8[facility]   Final   Culture NO GROWTH 2 DAYS   Final    Report Status PENDING   Incomplete  CULTURE, ROUTINE-ABSCESS     Status: None   Collection Time    11/08/12  1:15 PM      Result Value Range Status   Specimen Description OTHER   Final   Special Requests Normal   Final   Gram Stain PENDING   Incomplete   Culture MULTIPLE ORGANISMS PRESENT, NONE PREDOMINANT   Final   Report Status PENDING   Incomplete  CULTURE, BLOOD (ROUTINE X 2)     Status: None   Collection Time    11/08/12  1:25 PM      Result Value Range Status   Specimen Description     Final   Value: BLOOD BOTTLES DRAWN AEROBIC AND ANAEROBIC LEFT ANTECUBITAL   Special Requests 8[facility] EACH   Final   Culture NO GROWTH 2 DAYS   Final   Report Status PENDING   Incomplete     Studies: No results found.  Scheduled Meds: . antiseptic oral rinse  15 mL Mouth Rinse BID  . enoxaparin (LOVENOX) injection  40 mg Subcutaneous Daily  . fluticasone  2 spray Each Nare Daily  . guaiFENesin  600 mg Oral BID  . insulin aspart  0-15 Units Subcutaneous TID WC  . insulin aspart  0-5 Units Subcutaneous QHS  . insulin aspart  30 Units Subcutaneous TID AC  . insulin glargine  20 Units Subcutaneous BID  . levofloxacin (LEVAQUIN) IV  750 mg Intravenous Q24H  . pantoprazole  40 mg Oral Daily  . sodium chloride  3 mL Intravenous Q12H  . vancomycin  1,250 mg Intravenous Q8H  . venlafaxine XR  150 mg Oral Q breakfast   Continuous Infusions: . sodium chloride 125 mL/hr (11/08/12 1452)  . sodium chloride 125 mL/hr (11/10/12 0856)    Active Problems:   DM   HYPERTENSION   Sepsis   Community acquired pneumonia   Dehydration   Abscess of groin, left    Time spent:    Sang Blount  [facility] Pager [phone]. If 7PM-7AM, please contact night-coverage at [email or web address], password [facility] 11/10/2012, 1:59 PM  LOS: 2 days

## 2012-11-10 NOTE — Progress Notes (Signed)
  Subjective: Left groin wound less tender today. Continues to drain.  Objective: Vital signs in last 24 hours: Temp:  [99.1 F (37.3 C)-100.5 F (38.1 C)] 99.1 F (37.3 C) (08/04 0559) Pulse Rate:  [89-96] 90 (08/04 0856) Resp:  [20] 20 (08/04 0559) BP: (88-89)/(56-59) 88/56 mmHg (08/04 0856) SpO2:  [84 %-96 %] 94 % (08/04 0559)    Intake/Output from previous day: 08/03 0701 - 08/04 0700 In: 120 [P.O.:120] Out: -  Intake/Output this shift:    General appearance: alert, cooperative and no distress Skin: Less induration and erythema noted in the left groin. Still with small draining pocket.  Lab Results:   Recent Labs  11/09/12 0616 11/10/12 0549  WBC 20.0* 16.5*  HGB 11.5* 10.8*  HCT 34.1* 32.4*  PLT 177 191   BMET  Recent Labs  11/09/12 0616 11/10/12 0549  NA 130* 133*  K 4.1 3.3*  CL 96 97  CO2 26 27  GLUCOSE 213* 179*  BUN 7 5*  CREATININE 0.51 0.53  CALCIUM 8.2* 8.4   PT/INR No results found for this basename: LABPROT, INR,  in the last 72 hours  Studies/Results: Dg Chest 2 View  11/08/2012   *RADIOLOGY REPORT*  Clinical Data: Cough.  Fever.  CHEST - 2 VIEW  Comparison: Chest x-ray 09/25/2012.  Findings: There are ill-defined areas of interstitial prominence and subtle hazy airspace disease in the right mid to lower lung, which may represent early changes of bronchopneumonia. These are difficult to localize on the lateral projection.  The lungs are otherwise clear.  No pleural effusions.  No evidence of pulmonary edema.  Heart size is normal. The patient is rotated to the right on today's exam, resulting in distortion of the mediastinal contours and reduced diagnostic sensitivity and specificity for mediastinal pathology.  IMPRESSION: 1.  Findings, as above, concerning for early changes of bronchopneumonia in the right mid to lower lung.   Original Report Authenticated By: Trudie Reed, M.D.    Anti-infectives: Anti-infectives   Start     Dose/Rate  Route Frequency Ordered Stop   11/10/12 1500  vancomycin (VANCOCIN) 1,250 mg in sodium chloride 0.9 % 250 mL IVPB     1,250 mg 166.7 mL/hr over 90 Minutes Intravenous Every 8 hours 11/10/12 0754     11/08/12 2300  vancomycin (VANCOCIN) IVPB 1000 mg/200 mL premix  Status:  Discontinued     1,000 mg 200 mL/hr over 60 Minutes Intravenous Every 8 hours 11/08/12 1447 11/10/12 0754   11/08/12 1330  vancomycin (VANCOCIN) IVPB 1000 mg/200 mL premix     1,000 mg 200 mL/hr over 60 Minutes Intravenous  Once 11/08/12 1319 11/08/12 1605   11/08/12 1130  levofloxacin (LEVAQUIN) IVPB 750 mg     750 mg 100 mL/hr over 90 Minutes Intravenous Every 24 hours 11/08/12 1119        Assessment/Plan: Impression: Cellulitis with small abscess, left groin, improving. Leukocytosis is resolving. Plan: Continue current wound therapy and IV vancomycin.  LOS: 2 days    Santiel Topper A 11/10/2012

## 2012-11-11 DIAGNOSIS — E860 Dehydration: Secondary | ICD-10-CM

## 2012-11-11 LAB — BASIC METABOLIC PANEL
BUN: 5 mg/dL — ABNORMAL LOW (ref 6–23)
Chloride: 99 mEq/L (ref 96–112)
GFR calc non Af Amer: >90 mL/min (ref >90)
Glucose, Bld: 155 mg/dL — ABNORMAL HIGH (ref 70–99)
Potassium: 3.4 mEq/L — ABNORMAL LOW (ref 3.5–5.1)

## 2012-11-11 LAB — CBC
HCT: 29.6 % — ABNORMAL LOW (ref 36.0–46.0)
Hemoglobin: 9.9 g/dL — ABNORMAL LOW (ref 12.0–15.0)
MCHC: 33.4 g/dL (ref 30.0–36.0)
MCV: 84.3 fL (ref 78.0–100.0)

## 2012-11-11 LAB — CULTURE, ROUTINE-ABSCESS

## 2012-11-11 LAB — GLUCOSE, CAPILLARY: Glucose-Capillary: 136 mg/dL — ABNORMAL HIGH (ref 70–99)

## 2012-11-11 MED ORDER — POTASSIUM CHLORIDE CRYS ER 20 MEQ PO TBCR
40.0000 meq | EXTENDED_RELEASE_TABLET | Freq: Once | ORAL | Status: AC
  Administered 2012-11-11: 40 meq via ORAL
  Filled 2012-11-11: qty 2

## 2012-11-11 MED ORDER — POLYETHYLENE GLYCOL 3350 17 G PO PACK
17.0000 g | PACK | Freq: Every day | ORAL | Status: DC
  Administered 2012-11-11 – 2012-11-17 (×6): 17 g via ORAL
  Filled 2012-11-11 (×6): qty 1

## 2012-11-11 NOTE — Progress Notes (Signed)
  Subjective: Pain in left groin still present, but decreased.  Objective: Vital signs in last 24 hours: Temp:  [98.5 F (36.9 C)-102.8 F (39.3 C)] 99.4 F (37.4 C) (08/05 0609) Pulse Rate:  [85-110] 110 (08/05 0453) Resp:  [20] 20 (08/05 0453) BP: (81-102)/(53-64) 102/64 mmHg (08/05 0453) SpO2:  [92 %-98 %] 93 % (08/05 0453)    Intake/Output from previous day: 08/04 0701 - 08/05 0700 In: 6702.1 [I.V.:5502.1; IV Piggyback:1200] Out: -  Intake/Output this shift:    Skin: Induration and erythema slightly less. Still in a bandlike pattern with some serosanguineous drainage noted.  Lab Results:   Recent Labs  11/10/12 0549 11/11/12 0550  WBC 16.5* 13.3*  HGB 10.8* 9.9*  HCT 32.4* 29.6*  PLT 191 232   BMET  Recent Labs  11/10/12 0549 11/11/12 0550  NA 133* 133*  K 3.3* 3.4*  CL 97 99  CO2 27 27  GLUCOSE 179* 155*  BUN 5* 5*  CREATININE 0.53 0.56  CALCIUM 8.4 8.4   PT/INR No results found for this basename: LABPROT, INR,  in the last 72 hours  Studies/Results: No results found.  Anti-infectives: Anti-infectives   Start     Dose/Rate Route Frequency Ordered Stop   11/10/12 1500  vancomycin (VANCOCIN) 1,250 mg in sodium chloride 0.9 % 250 mL IVPB     1,250 mg 166.7 mL/hr over 90 Minutes Intravenous Every 8 hours 11/10/12 0754     11/08/12 2300  vancomycin (VANCOCIN) IVPB 1000 mg/200 mL premix  Status:  Discontinued     1,000 mg 200 mL/hr over 60 Minutes Intravenous Every 8 hours 11/08/12 1447 11/10/12 0754   11/08/12 1330  vancomycin (VANCOCIN) IVPB 1000 mg/200 mL premix     1,000 mg 200 mL/hr over 60 Minutes Intravenous  Once 11/08/12 1319 11/08/12 1605   11/08/12 1130  levofloxacin (LEVAQUIN) IVPB 750 mg     750 mg 100 mL/hr over 90 Minutes Intravenous Every 24 hours 11/08/12 1119        Assessment/Plan: Impression: Cellulitis with small abscess, left groin. Improving. Leukocytosis resolving. Plan: Continue current wound therapy.  LOS: 3 days     Arlester Keehan A 11/11/2012

## 2012-11-11 NOTE — Progress Notes (Signed)
[facility] PROGRESS NOTE  Ann Lopez ZOX:[id number] DOB: 05/13/1970 DOA: 11/08/2012 PCP: Elvina Sidle, MD  Assessment/Plan: Pneumonia. Community acquired. Patient has allergies to azithromycin and penicillin. She is on levaquin. Will continue with supportive measures. She is beginning to produce sputum. Slowly improving.  Continue current treatments.  Left groin abscess. Patient on vancomycin. Culture from wound did not have any specific growth. General surgery assistance appreciated. Continue warm compressess   Sepsis due to 1 and 2. On antibiotics and IV fluids. She did have another fever spike overnight.  Leukocytosis is improving.  Will continue to follow.   Diabetes. Will continue lantus and novolog. Supplement with sliding scale insulin   Hypertension, currently hypotensive. Hold meds for now. Patient reports that her blood pressure normally runs on the lower side and on average is 90/50  Hyponatremia. Due to dehydration. Will continue with iv fluids.  Hypokalemia, replace  IV access. Patient had lost IV access in her right arm yesterday.  New IV was placed in the left arm.  Since patient had mastectomy with lymph node dissection on the left, will try to move IV back to right side. Patient was offered PICC line, but has declined.  She does not state the reason why she does not want a PICC line.  Code Status: full code Family Communication: discussed with patient Disposition Plan: discharge home once improved   Consultants:  General surgery  Procedures:  none  Antibiotics:  Vancomycin 8/2  Levofloxacin 8/2  HPI/Subjective: Feeling about the same.  Still short of breath and has right sided chest pain with cough.  Wound is still draining serous fluid and pain is extending from front of abdomen around to her back.  Objective: Filed Vitals:   11/10/12 1831 11/10/12 2025 11/11/12 0453 11/11/12 0609  BP:  81/53 102/64   Pulse:  88 110   Temp:  98.5 F  (36.9 C) 102.8 F (39.3 C) 99.4 F (37.4 C)  TempSrc:  Oral Oral Oral  Resp:  20 20   Height:      Weight:      SpO2: 98% 92% 93%     Intake/Output Summary (Last 24 hours) at 11/11/12 1226 Last data filed at 11/11/12 0900  Gross per 24 hour  Intake 6902.08 ml  Output      0 ml  Net 6902.08 ml   Filed Weights   11/08/12 0953 11/08/12 1403  Weight: 95.255 kg (210 lb) 95.255 kg (210 lb)    Exam:   General:  NAD  Cardiovascular: s1, s2, rrr  Respiratory: crackles at right base  Abdomen: soft, tender in left lower quadrant  Musculoskeletal: erythema has improved. Serous drainage still noted. Induration is extensive and in band like area     Data Reviewed: Basic Metabolic Panel:  Recent Labs Lab 11/08/12 1144 11/09/12 0616 11/10/12 0549 11/11/12 0550  NA 127* 130* 133* 133*  K 3.6 4.1 3.3* 3.4*  CL 92* 96 97 99  CO2 24 26 27 27   GLUCOSE 278* 213* 179* 155*  BUN 10 7 5* 5*  CREATININE 0.67 0.51 0.53 0.56  CALCIUM 8.9 8.2* 8.4 8.4   Liver Function Tests: No results found for this basename: AST, ALT, ALKPHOS, BILITOT, PROT, ALBUMIN,  in the last 168 hours No results found for this basename: LIPASE, AMYLASE,  in the last 168 hours No results found for this basename: AMMONIA,  in the last 168 hours CBC:  Recent Labs Lab 11/08/12 1144 11/09/12 0616 11/10/12 0549 11/11/12 0550  WBC 24.4* 20.0* 16.5* 13.3*  NEUTROABS 20.1*  --   --   --   HGB 13.9 11.5* 10.8* 9.9*  HCT 40.4 34.1* 32.4* 29.6*  MCV 84.2 84.6 84.4 84.3  PLT 189 177 191 232   Cardiac Enzymes: No results found for this basename: CKTOTAL, CKMB, CKMBINDEX, TROPONINI,  in the last 168 hours BNP (last 3 results) No results found for this basename: PROBNP,  in the last 8760 hours CBG:  Recent Labs Lab 11/10/12 1136 11/10/12 1605 11/10/12 2102 11/11/12 0701 11/11/12 1103  GLUCAP 94 109* 100* 136* 136*    Recent Results (from the past 240 hour(s))  CULTURE, BLOOD (ROUTINE X 2)      Status: None   Collection Time    11/08/12 11:40 AM      Result Value Range Status   Specimen Description BLOOD LEFT ANTECUBITAL   Final   Special Requests BOTTLES DRAWN AEROBIC ONLY 8CC   Final   Culture NO GROWTH 3 DAYS   Final   Report Status PENDING   Incomplete  CULTURE, ROUTINE-ABSCESS     Status: None   Collection Time    11/08/12  1:15 PM      Result Value Range Status   Specimen Description ABSCESS LEFT GROIN   Final   Special Requests NONE   Final   Gram Stain     Final   Value: FEW WBC PRESENT, PREDOMINANTLY PMN     NO SQUAMOUS EPITHELIAL CELLS SEEN     FEW GRAM POSITIVE COCCI IN PAIRS     Performed at [facility]   Culture     Final   Value: MULTIPLE ORGANISMS PRESENT, NONE PREDOMINANT     Note: NO STAPHYLOCOCCUS AUREUS ISOLATED NO GROUP A STREP (S.PYOGENES) ISOLATED     Performed at [facility]   Report Status 11/11/2012 FINAL   Final  CULTURE, BLOOD (ROUTINE X 2)     Status: None   Collection Time    11/08/12  1:25 PM      Result Value Range Status   Specimen Description BLOOD LEFT ANTECUBITAL   Final   Special Requests BOTTLES DRAWN AEROBIC AND ANAEROBIC 8CC   Final   Culture NO GROWTH 3 DAYS   Final   Report Status PENDING   Incomplete     Studies: No results found.  Scheduled Meds: . antiseptic oral rinse  15 mL Mouth Rinse BID  . enoxaparin (LOVENOX) injection  40 mg Subcutaneous Daily  . fluticasone  2 spray Each Nare Daily  . guaiFENesin  600 mg Oral BID  . insulin aspart  0-15 Units Subcutaneous TID WC  . insulin aspart  0-5 Units Subcutaneous QHS  . insulin aspart  30 Units Subcutaneous TID AC  . insulin glargine  20 Units Subcutaneous BID  . levofloxacin (LEVAQUIN) IV  750 mg Intravenous Q24H  . pantoprazole  40 mg Oral Daily  . sodium chloride  3 mL Intravenous Q12H  . vancomycin  1,250 mg Intravenous Q8H  . venlafaxine XR  150 mg Oral Q breakfast   Continuous Infusions: . sodium chloride 125 mL/hr (11/08/12 1452)  .  sodium chloride 125 mL/hr at 11/10/12 2056    Active Problems:   DM   HYPERTENSION   Sepsis   Community acquired pneumonia   Dehydration   Abscess of groin, left    Time spent:    Alby Schwabe  [facility] Pager [phone]. If 7PM-7AM, please contact night-coverage at [email or web address], password [facility]  11/11/2012, 12:26 PM  LOS: 3 days

## 2012-11-12 ENCOUNTER — Inpatient Hospital Stay (HOSPITAL_COMMUNITY): Payer: Medicare Other

## 2012-11-12 LAB — CBC
HCT: 27.8 % — ABNORMAL LOW (ref 36.0–46.0)
MCV: 84.5 fL (ref 78.0–100.0)
RBC: 3.29 MIL/uL — ABNORMAL LOW (ref 3.87–5.11)
WBC: 14.3 10*3/uL — ABNORMAL HIGH (ref 4.0–10.5)

## 2012-11-12 LAB — BASIC METABOLIC PANEL
CO2: 27 mEq/L (ref 19–32)
Chloride: 99 mEq/L (ref 96–112)
Sodium: 135 mEq/L (ref 135–145)

## 2012-11-12 LAB — GLUCOSE, CAPILLARY
Glucose-Capillary: 132 mg/dL — ABNORMAL HIGH (ref 70–99)
Glucose-Capillary: 61 mg/dL — ABNORMAL LOW (ref 70–99)

## 2012-11-12 NOTE — Progress Notes (Signed)
  Subjective: States that she still has some tenderness in the left groin region.  Objective: Vital signs in last 24 hours: Temp:  [98.4 F (36.9 C)-99.9 F (37.7 C)] 99.6 F (37.6 C) (08/06 0458) Pulse Rate:  [88-93] 93 (08/06 0458) Resp:  [20] 20 (08/06 0458) BP: (84-104)/(45-61) 84/45 mmHg (08/06 0458) SpO2:  [82 %-96 %] 95 % (08/06 0731) Last BM Date:  (unsure)  Intake/Output from previous day: 08/05 0701 - 08/06 0700 In: 4337.9 [P.O.:590; I.V.:2997.9; IV Piggyback:750] Out: 200 [Urine:200] Intake/Output this shift:    General appearance: alert, cooperative and no distress Skin: Minimal drainage from open wound medially. The band of induration seems to be more fluctuant with his pain is still present. It does seem to extend more laterally over the left hip.  Lab Results:   Recent Labs  11/11/12 0550 11/12/12 0541  WBC 13.3* 14.3*  HGB 9.9* 9.2*  HCT 29.6* 27.8*  PLT 232 239   BMET  Recent Labs  11/11/12 0550 11/12/12 0541  NA 133* 135  K 3.4* 3.3*  CL 99 99  CO2 27 27  GLUCOSE 155* 151*  BUN 5* 4*  CREATININE 0.56 0.54  CALCIUM 8.4 8.5   PT/INR No results found for this basename: LABPROT, INR,  in the last 72 hours  Studies/Results: No results found.  Anti-infectives: Anti-infectives   Start     Dose/Rate Route Frequency Ordered Stop   11/10/12 1500  vancomycin (VANCOCIN) 1,250 mg in sodium chloride 0.9 % 250 mL IVPB     1,250 mg 166.7 mL/hr over 90 Minutes Intravenous Every 8 hours 11/10/12 0754     11/08/12 2300  vancomycin (VANCOCIN) IVPB 1000 mg/200 mL premix  Status:  Discontinued     1,000 mg 200 mL/hr over 60 Minutes Intravenous Every 8 hours 11/08/12 1447 11/10/12 0754   11/08/12 1330  vancomycin (VANCOCIN) IVPB 1000 mg/200 mL premix     1,000 mg 200 mL/hr over 60 Minutes Intravenous  Once 11/08/12 1319 11/08/12 1605   11/08/12 1130  levofloxacin (LEVAQUIN) IVPB 750 mg     750 mg 100 mL/hr over 90 Minutes Intravenous Every 24 hours  11/08/12 1119        Assessment/Plan: Impression: Cellulitis with draining abscess, left groin. Plan: We'll get ultrasound of the left groin to see if there any more abscess cavities present. Further management is pending those results.  LOS: 4 days    Merritt Mccravy A 11/12/2012

## 2012-11-12 NOTE — Progress Notes (Signed)
Hypoglycemic Event  CBG: 61  Treatment: 15 GM carbohydrate snack  Symptoms: None  Follow-up CBG: Time:1154   CBG Result:88  Possible Reasons for Event: Unknown  Comments/MD notified:text paged Dr. Kerry Hough.     Sheryn Bison  Remember to initiate Hypoglycemia Order Set & complete

## 2012-11-12 NOTE — Progress Notes (Signed)
[facility] PROGRESS NOTE  Ann Lopez ZOX:[id number] DOB: December 19, 1970 DOA: 11/08/2012 PCP: Elvina Sidle, MD  Assessment/Plan: Pneumonia. Community acquired. Patient has allergies to azithromycin and penicillin. She is on levaquin. Will continue with supportive measures. She is beginning to produce sputum. Slowly improving.  Continue current treatments. She will need repeat chest xray in 2-3 weeks to ensure clearing.  Consider repeating it sooner if patient does not continue to improve.  Left groin abscess. Patient on vancomycin. Culture from wound did not have any specific growth. General surgery assistance appreciated. Continue warm compressess.  Plans are for ultrasound of left groin today to assess if further I and D is needed.     Sepsis due to 1 and 2. On antibiotics and IV fluids. Continues to have low grade temps.  Leukocytosis has plateaued.  Will continue to follow.   Diabetes. Will continue lantus and novolog. Supplement with sliding scale insulin   Hypotension. Patient reports that her blood pressure normally runs on the lower side and on average is 90/50  Hyponatremia. Due to dehydration. Will continue with iv fluids.  Hypokalemia, replace  IV access. Patient had lost IV access in her right arm yesterday.  New IV was placed in the left arm.  Since patient had mastectomy with lymph node dissection on the left, will try to move IV back to right side. Patient was offered PICC line, but has declined.  She does not state the reason why she does not want a PICC line.  Code Status: full code Family Communication: discussed with patient Disposition Plan: discharge home once improved   Consultants:  General surgery  Procedures:  none  Antibiotics:  Vancomycin 8/2 -   Levofloxacin 8/2 -   HPI/Subjective: Still having pain in the left groin, radiating around back and into left thigh.  Still coughing and feels short of breath.  Objective: Filed Vitals:   11/11/12 2008 11/12/12 0323 11/12/12 0458 11/12/12 0731  BP: 84/50  84/45   Pulse: 88  93   Temp: 99.9 F (37.7 C)  99.6 F (37.6 C)   TempSrc: Oral  Oral   Resp: 20  20   Height:      Weight:      SpO2: 96% 82% 94% 95%    Intake/Output Summary (Last 24 hours) at 11/12/12 1055 Last data filed at 11/12/12 8119  Gross per 24 hour  Intake 4137.92 ml  Output    200 ml  Net 3937.92 ml   Filed Weights   11/08/12 0953 11/08/12 1403  Weight: 95.255 kg (210 lb) 95.255 kg (210 lb)    Exam:   General:  NAD  Cardiovascular: s1, s2, rrr  Respiratory: crackles at right base  Abdomen: soft, tender in left lower quadrant  Musculoskeletal: erythema still present large band like area of induration and blistering in left groin area  Data Reviewed: Basic Metabolic Panel:  Recent Labs Lab 11/08/12 1144 11/09/12 0616 11/10/12 0549 11/11/12 0550 11/12/12 0541  NA 127* 130* 133* 133* 135  K 3.6 4.1 3.3* 3.4* 3.3*  CL 92* 96 97 99 99  CO2 24 26 27 27 27   GLUCOSE 278* 213* 179* 155* 151*  BUN 10 7 5* 5* 4*  CREATININE 0.67 0.51 0.53 0.56 0.54  CALCIUM 8.9 8.2* 8.4 8.4 8.5   Liver Function Tests: No results found for this basename: AST, ALT, ALKPHOS, BILITOT, PROT, ALBUMIN,  in the last 168 hours No results found for this basename: LIPASE, AMYLASE,  in the last 168  hours No results found for this basename: AMMONIA,  in the last 168 hours CBC:  Recent Labs Lab 11/08/12 1144 11/09/12 0616 11/10/12 0549 11/11/12 0550 11/12/12 0541  WBC 24.4* 20.0* 16.5* 13.3* 14.3*  NEUTROABS 20.1*  --   --   --   --   HGB 13.9 11.5* 10.8* 9.9* 9.2*  HCT 40.4 34.1* 32.4* 29.6* 27.8*  MCV 84.2 84.6 84.4 84.3 84.5  PLT 189 177 191 232 239   Cardiac Enzymes: No results found for this basename: CKTOTAL, CKMB, CKMBINDEX, TROPONINI,  in the last 168 hours BNP (last 3 results) No results found for this basename: PROBNP,  in the last 8760 hours CBG:  Recent Labs Lab 11/11/12 0701  11/11/12 1103 11/11/12 1629 11/11/12 2106 11/12/12 0732  GLUCAP 136* 136* 117* 155* 132*    Recent Results (from the past 240 hour(s))  CULTURE, BLOOD (ROUTINE X 2)     Status: None   Collection Time    11/08/12 11:40 AM      Result Value Range Status   Specimen Description BLOOD LEFT ANTECUBITAL   Final   Special Requests BOTTLES DRAWN AEROBIC ONLY 8CC   Final   Culture NO GROWTH 4 DAYS   Final   Report Status PENDING   Incomplete  CULTURE, ROUTINE-ABSCESS     Status: None   Collection Time    11/08/12  1:15 PM      Result Value Range Status   Specimen Description ABSCESS LEFT GROIN   Final   Special Requests NONE   Final   Gram Stain     Final   Value: FEW WBC PRESENT, PREDOMINANTLY PMN     NO SQUAMOUS EPITHELIAL CELLS SEEN     FEW GRAM POSITIVE COCCI IN PAIRS     Performed at [facility]   Culture     Final   Value: MULTIPLE ORGANISMS PRESENT, NONE PREDOMINANT     Note: NO STAPHYLOCOCCUS AUREUS ISOLATED NO GROUP A STREP (S.PYOGENES) ISOLATED     Performed at [facility]   Report Status 11/11/2012 FINAL   Final  CULTURE, BLOOD (ROUTINE X 2)     Status: None   Collection Time    11/08/12  1:25 PM      Result Value Range Status   Specimen Description BLOOD LEFT ANTECUBITAL   Final   Special Requests BOTTLES DRAWN AEROBIC AND ANAEROBIC 8CC   Final   Culture NO GROWTH 4 DAYS   Final   Report Status PENDING   Incomplete     Studies: No results found.  Scheduled Meds: . antiseptic oral rinse  15 mL Mouth Rinse BID  . enoxaparin (LOVENOX) injection  40 mg Subcutaneous Daily  . fluticasone  2 spray Each Nare Daily  . guaiFENesin  600 mg Oral BID  . insulin aspart  0-15 Units Subcutaneous TID WC  . insulin aspart  0-5 Units Subcutaneous QHS  . insulin aspart  30 Units Subcutaneous TID AC  . insulin glargine  20 Units Subcutaneous BID  . levofloxacin (LEVAQUIN) IV  750 mg Intravenous Q24H  . pantoprazole  40 mg Oral Daily  . polyethylene glycol  17 g  Oral Daily  . sodium chloride  3 mL Intravenous Q12H  . vancomycin  1,250 mg Intravenous Q8H  . venlafaxine XR  150 mg Oral Q breakfast   Continuous Infusions: . sodium chloride 125 mL/hr (11/08/12 1452)  . sodium chloride 125 mL/hr at 11/10/12 2056    Active Problems:  DM   HYPERTENSION   Sepsis   Community acquired pneumonia   Dehydration   Abscess of groin, left    Time spent:    Toshie Demelo  [facility] Pager [phone]. If 7PM-7AM, please contact night-coverage at [email or web address], password [facility] 11/12/2012, 10:55 AM  LOS: 4 days

## 2012-11-13 DIAGNOSIS — E669 Obesity, unspecified: Secondary | ICD-10-CM

## 2012-11-13 LAB — BASIC METABOLIC PANEL
BUN: 3 mg/dL — ABNORMAL LOW (ref 6–23)
Calcium: 8.7 mg/dL (ref 8.4–10.5)
GFR calc Af Amer: >90 mL/min (ref >90)
GFR calc non Af Amer: >90 mL/min (ref >90)
Potassium: 3.2 mEq/L — ABNORMAL LOW (ref 3.5–5.1)

## 2012-11-13 LAB — CBC
HCT: 29.3 % — ABNORMAL LOW (ref 36.0–46.0)
MCHC: 33.1 g/dL (ref 30.0–36.0)
RDW: 14.4 % (ref 11.5–15.5)

## 2012-11-13 LAB — CULTURE, BLOOD (ROUTINE X 2)

## 2012-11-13 LAB — GLUCOSE, CAPILLARY: Glucose-Capillary: 88 mg/dL (ref 70–99)

## 2012-11-13 MED ORDER — CHLORHEXIDINE GLUCONATE 4 % EX LIQD
1.0000 | Freq: Once | CUTANEOUS | Status: AC
Start: 2012-11-13 — End: 2012-11-13
  Administered 2012-11-13: 1 via TOPICAL
  Filled 2012-11-13: qty 15

## 2012-11-13 MED ORDER — POTASSIUM CHLORIDE IN NACL 40-0.9 MEQ/L-% IV SOLN
INTRAVENOUS | Status: DC
  Administered 2012-11-13 – 2012-11-15 (×4): via INTRAVENOUS

## 2012-11-13 MED ORDER — POTASSIUM CHLORIDE 10 MEQ/100ML IV SOLN
10.0000 meq | INTRAVENOUS | Status: AC
  Administered 2012-11-13 (×4): 10 meq via INTRAVENOUS
  Filled 2012-11-13: qty 400

## 2012-11-13 NOTE — Progress Notes (Signed)
Ultrasound of left groin reveals multiple abscesses. The patient be taken to the operating room tomorrow for incision and drainage of left groin abscess. The risks and benefits of the procedure were fully explained to the patient, who gave informed consent.

## 2012-11-13 NOTE — Progress Notes (Signed)
Ann Lopez ZOX:[id number] DOB: January 26, 1971 DOA: 11/08/2012 PCP: Elvina Sidle, MD   Subjective: This lady has a cough. She has no fever. She is due to have incision and drainage of the left inguinal abscesses tomorrow.           Physical Exam: Blood pressure 83/47, pulse 81, temperature 98.3 F (36.8 C), temperature source Oral, resp. rate 20, height 5\' 2"  (1.575 m), weight 95.255 kg (210 lb), SpO2 95.00%. She looks systemically well. She is not toxic or septic clinically. Heart sounds are present and normal without murmurs or added sounds. Lung fields are clear. She clearly has significant left groin abscess/cellulitis. She is alert and orientated.   Investigations:  Recent Results (from the past 240 hour(s))  CULTURE, BLOOD (ROUTINE X 2)     Status: None   Collection Time    11/08/12 11:40 AM      Result Value Range Status   Specimen Description BLOOD LEFT ANTECUBITAL   Final   Special Requests BOTTLES DRAWN AEROBIC ONLY 8CC   Final   Culture NO GROWTH 4 DAYS   Final   Report Status PENDING   Incomplete  CULTURE, ROUTINE-ABSCESS     Status: None   Collection Time    11/08/12  1:15 PM      Result Value Range Status   Specimen Description ABSCESS LEFT GROIN   Final   Special Requests NONE   Final   Gram Stain     Final   Value: FEW WBC PRESENT, PREDOMINANTLY PMN     NO SQUAMOUS EPITHELIAL CELLS SEEN     FEW GRAM POSITIVE COCCI IN PAIRS     Performed at [facility]   Culture     Final   Value: MULTIPLE ORGANISMS PRESENT, NONE PREDOMINANT     Note: NO STAPHYLOCOCCUS AUREUS ISOLATED NO GROUP A STREP (S.PYOGENES) ISOLATED     Performed at [facility]   Report Status 11/11/2012 FINAL   Final  CULTURE, BLOOD (ROUTINE X 2)     Status: None   Collection Time    11/08/12  1:25 PM      Result Value Range Status   Specimen Description BLOOD LEFT ANTECUBITAL   Final   Special Requests BOTTLES DRAWN AEROBIC AND ANAEROBIC 8CC   Final   Culture NO GROWTH  4 DAYS   Final   Report Status PENDING   Incomplete     Basic Metabolic Panel:  Recent Labs  81/19/14 0541 11/13/12 0507  NA 135 136  K 3.3* 3.2*  CL 99 101  CO2 27 28  GLUCOSE 151* 111*  BUN 4* 3*  CREATININE 0.54 0.48*  CALCIUM 8.5 8.7       CBC:  Recent Labs  11/12/12 0541 11/13/12 0507  WBC 14.3* 16.0*  HGB 9.2* 9.7*  HCT 27.8* 29.3*  MCV 84.5 85.2  PLT 239 279    [place] Extrem Low Left Ltd  11/12/2012   *RADIOLOGY REPORT*  Clinical Data: Left groin cellulitis, evaluate for abscess  ULTRASOUND LEFT LOWER EXTREMITY LIMITED  Technique:  Ultrasound examination of the region of interest in the left lower extremity was performed.  Comparison:  None.  Findings: Complex well defined fluid collection in the left groin measures 3.4 x 1.6 x 4.2 cm. There is a second more superficial fluid collection just under the skin surface and superficial adipose tissue which is less well defined and contains a mixture of complex fluid and echogenic reflectors likely representing gas.  IMPRESSION:  There are at least two complex fluid collections in the left groin concerning for abscesses.  The more superficial fluid collection is less well defined contains a mixture of complex fluid and gas.  The slightly deeper complex fluid collection is more well defined and measures 4.2 x 3.4 x 1.6 cm.  These results were called by telephone on 11/12/2012 at 12:35 p.m. to Dr. Franky Macho, who verbally acknowledged these results.   Original Report Authenticated By: Malachy Moan, M.D.      Medications: I have reviewed the patient's current medications.  Impression: 1. Community-acquired pneumonia, clinically stable from this point of view. 2. Left groin abscesses, for surgery tomorrow. 3. Diabetes mellitus. 4. Hypertension. 5. Obesity. 6. Hypokalemia.     Plan: 1. Continue with intravenous Levaquin and vancomycin. 2. Replete potassium with IV fluids. 3. Await surgery  tomorrow.  Consultants:  Dr. Lovell Sheehan, surgery.   Procedures:  None.   Antibiotics:  Intravenous vancomycin started 11/10/2012.  Intravenous Levaquin started 11/08/2012.                  Code Status: Full code.  Family Communication: Discussed plan with patient at the bedside.   Disposition Plan: Home when medically stable.  Time spent: 15 minutes.   LOS: 5 days   Wilson Singer Pager [phone]  11/13/2012, 10:17 AM

## 2012-11-13 NOTE — Clinical Documentation Improvement (Signed)
THIS DOCUMENT IS NOT A PERMANENT PART OF THE MEDICAL RECORD  Please update your documentation with the medical record to reflect your response to this query. If you need help knowing how to do this please call [phone]   11/13/12  Dear Dr. Karilyn Cota Marton Redwood  A review of the patient medical record has revealed the following indicators.  Based on your clinical judgment, please clarify and document in a progress note and/or discharge summary the clinical condition associated with the following supporting information:  Admitted with sepsis, pneumonia, dehydration & abscess  Labs: H&H 8/2 = 13.9/40.4 8/3 = 11.5/34.1 8/4 =  10.8/32.4 8/5 = 9.9/29.6 8/6 = 9.2/27.8 8/7 = 9.7/29.3  Treatments IV NS @ 174ml/h Daily CBC monitoring  Possible Clinical Conditions?  " Iron deficiency anemia " Chronic blood loss anemia " Anemia due to chronic disease " Acute Blood Loss Anemia " Precipitous drop in Hematocrit " Other Condition____________ " Cannot Clinically Determine   You may use possible, probable, or suspect with inpatient documentation. Possible, probable, suspected diagnoses MUST be documented at the time of discharge.  Reviewed:  no additional documentation provided  Thank Angelene Giovanni RN Clinical Documentation Specialist: [phone] [facility] Health Information Management [place]

## 2012-11-13 NOTE — Care Management Note (Signed)
    Page 1 of 1   11/17/2012     10:45:23 AM   CARE MANAGEMENT NOTE 11/17/2012  Patient:  Ann Lopez, Ann Lopez   Account Number:  [id number]  Date Initiated:  11/13/2012  Documentation initiated by:  Rosemary Holms  Subjective/Objective Assessment:   Pt admitted from home. Lives with her sons. Plans to DC home when stable.     Action/Plan:   Anticipated DC Date:  11/15/2012   Anticipated DC Plan:  HOME W HOME HEALTH SERVICES      DC Planning Services  CM consult      Choice offered to / List presented to:             Status of service:  Completed, signed off Medicare Important Message given?  YES (If response is "NO", the following Medicare IM given date fields will be blank) Date Medicare IM given:  11/17/2012 Date Additional Medicare IM given:    Discharge Disposition:  HOME/SELF CARE  Per UR Regulation:    If discussed at Long Length of Stay Meetings, dates discussed:   11/13/2012    Comments:  11/17/12 1045 Arlyss Queen, RN BSN CM Pt discharged home today. Pt will return three times a week for pulse lavage at [facility] Pt dept. Pt stated that she has transportation and that would be fine with her. No HH arranged and pt is aware. No other CM needs noted.  11/13/12 Rosemary Holms RN  BSN CM

## 2012-11-14 ENCOUNTER — Encounter (HOSPITAL_COMMUNITY)
Admission: EM | Disposition: A | Discharge status: Home Health | Payer: Self-pay | Source: Home / Self Care | Attending: Internal Medicine

## 2012-11-14 ENCOUNTER — Inpatient Hospital Stay (HOSPITAL_COMMUNITY): Payer: Medicare Other | Admitting: Anesthesiology

## 2012-11-14 ENCOUNTER — Encounter (HOSPITAL_COMMUNITY): Payer: Self-pay | Admitting: *Deleted

## 2012-11-14 ENCOUNTER — Encounter (HOSPITAL_COMMUNITY): Payer: Self-pay | Admitting: Anesthesiology

## 2012-11-14 ENCOUNTER — Inpatient Hospital Stay (HOSPITAL_COMMUNITY): Payer: Medicare Other

## 2012-11-14 HISTORY — PX: INCISION AND DRAINAGE ABSCESS: SHX5864

## 2012-11-14 LAB — BASIC METABOLIC PANEL
CO2: 28 mEq/L (ref 19–32)
Calcium: 8.6 mg/dL (ref 8.4–10.5)
Chloride: 99 mEq/L (ref 96–112)
Glucose, Bld: 92 mg/dL (ref 70–99)
Potassium: 3.3 mEq/L — ABNORMAL LOW (ref 3.5–5.1)
Sodium: 137 mEq/L (ref 135–145)

## 2012-11-14 LAB — CBC WITH DIFFERENTIAL/PLATELET
Eosinophils Absolute: 0.2 10*3/uL (ref 0.0–0.7)
Eosinophils Relative: 2 % (ref 0–5)
HCT: 31.5 % — ABNORMAL LOW (ref 36.0–46.0)
Hemoglobin: 10.2 g/dL — ABNORMAL LOW (ref 12.0–15.0)
Lymphocytes Relative: 14 % (ref 12–46)
Lymphs Abs: 2.1 10*3/uL (ref 0.7–4.0)
MCH: 27.6 pg (ref 26.0–34.0)
MCV: 85.1 fL (ref 78.0–100.0)
Monocytes Relative: 8 % (ref 3–12)
RBC: 3.7 MIL/uL — ABNORMAL LOW (ref 3.87–5.11)

## 2012-11-14 LAB — VANCOMYCIN, TROUGH: Vancomycin Tr: 13.4 ug/mL (ref 10.0–20.0)

## 2012-11-14 LAB — GLUCOSE, CAPILLARY
Glucose-Capillary: 88 mg/dL (ref 70–99)
Glucose-Capillary: 229 mg/dL — ABNORMAL HIGH (ref 70–99)

## 2012-11-14 SURGERY — INCISION AND DRAINAGE, ABSCESS
Anesthesia: General | Site: Groin | Laterality: Left | Wound class: Dirty or Infected

## 2012-11-14 MED ORDER — DEXAMETHASONE SODIUM PHOSPHATE 4 MG/ML IJ SOLN
INTRAMUSCULAR | Status: AC
  Filled 2012-11-14: qty 1

## 2012-11-14 MED ORDER — PROPOFOL 10 MG/ML IV BOLUS
INTRAVENOUS | Status: DC | PRN
  Administered 2012-11-14: 50 mg via INTRAVENOUS
  Administered 2012-11-14: 100 mg via INTRAVENOUS
  Administered 2012-11-14: 150 mg via INTRAVENOUS

## 2012-11-14 MED ORDER — LACTATED RINGERS IV SOLN
INTRAVENOUS | Status: DC
  Administered 2012-11-14: 1000 mL via INTRAVENOUS

## 2012-11-14 MED ORDER — BUPIVACAINE HCL (PF) 0.5 % IJ SOLN
INTRAMUSCULAR | Status: AC
  Filled 2012-11-14: qty 30

## 2012-11-14 MED ORDER — SODIUM CHLORIDE 0.9 % IN NEBU
INHALATION_SOLUTION | RESPIRATORY_TRACT | Status: AC
  Filled 2012-11-14: qty 3

## 2012-11-14 MED ORDER — KETOROLAC TROMETHAMINE 30 MG/ML IJ SOLN
INTRAMUSCULAR | Status: AC
  Filled 2012-11-14: qty 1

## 2012-11-14 MED ORDER — ALBUTEROL SULFATE HFA 108 (90 BASE) MCG/ACT IN AERS
INHALATION_SPRAY | RESPIRATORY_TRACT | Status: AC
  Filled 2012-11-14: qty 6.7

## 2012-11-14 MED ORDER — ALBUTEROL SULFATE (5 MG/ML) 0.5% IN NEBU
2.5000 mg | INHALATION_SOLUTION | Freq: Once | RESPIRATORY_TRACT | Status: AC
  Administered 2012-11-14: 2.5 mg via RESPIRATORY_TRACT

## 2012-11-14 MED ORDER — SUCCINYLCHOLINE CHLORIDE 20 MG/ML IJ SOLN
INTRAMUSCULAR | Status: AC
  Filled 2012-11-14: qty 1

## 2012-11-14 MED ORDER — ALBUTEROL SULFATE HFA 108 (90 BASE) MCG/ACT IN AERS
INHALATION_SPRAY | RESPIRATORY_TRACT | Status: DC | PRN
  Administered 2012-11-14: 3 via RESPIRATORY_TRACT

## 2012-11-14 MED ORDER — GLYCOPYRROLATE 0.2 MG/ML IJ SOLN
INTRAMUSCULAR | Status: DC | PRN
  Administered 2012-11-14: 0.2 mg via INTRAVENOUS

## 2012-11-14 MED ORDER — FENTANYL CITRATE 0.05 MG/ML IJ SOLN
INTRAMUSCULAR | Status: AC
  Filled 2012-11-14: qty 2

## 2012-11-14 MED ORDER — LIDOCAINE HCL (CARDIAC) 10 MG/ML IV SOLN
INTRAVENOUS | Status: DC | PRN
  Administered 2012-11-14: 20 mg via INTRAVENOUS

## 2012-11-14 MED ORDER — GLYCOPYRROLATE 0.2 MG/ML IJ SOLN
INTRAMUSCULAR | Status: AC
  Filled 2012-11-14: qty 1

## 2012-11-14 MED ORDER — KETOROLAC TROMETHAMINE 30 MG/ML IJ SOLN
30.0000 mg | Freq: Once | INTRAMUSCULAR | Status: AC
  Administered 2012-11-14: 30 mg via INTRAVENOUS

## 2012-11-14 MED ORDER — MIDAZOLAM HCL 2 MG/2ML IJ SOLN
INTRAMUSCULAR | Status: AC
  Filled 2012-11-14: qty 2

## 2012-11-14 MED ORDER — ONDANSETRON HCL 4 MG/2ML IJ SOLN
INTRAMUSCULAR | Status: AC
  Filled 2012-11-14: qty 2

## 2012-11-14 MED ORDER — ONDANSETRON HCL 4 MG/2ML IJ SOLN
4.0000 mg | Freq: Once | INTRAMUSCULAR | Status: AC
  Administered 2012-11-14: 4 mg via INTRAVENOUS

## 2012-11-14 MED ORDER — GLYCOPYRROLATE 0.2 MG/ML IJ SOLN
0.2000 mg | Freq: Once | INTRAMUSCULAR | Status: AC
  Administered 2012-11-14: 0.2 mg via INTRAVENOUS

## 2012-11-14 MED ORDER — SUCCINYLCHOLINE CHLORIDE 20 MG/ML IJ SOLN
INTRAMUSCULAR | Status: DC | PRN
  Administered 2012-11-14: 120 mg via INTRAVENOUS

## 2012-11-14 MED ORDER — LIDOCAINE HCL (PF) 1 % IJ SOLN
INTRAMUSCULAR | Status: AC
  Filled 2012-11-14: qty 5

## 2012-11-14 MED ORDER — ENOXAPARIN SODIUM 40 MG/0.4ML ~~LOC~~ SOLN
SUBCUTANEOUS | Status: AC
  Filled 2012-11-14: qty 0.4

## 2012-11-14 MED ORDER — FENTANYL CITRATE 0.05 MG/ML IJ SOLN
INTRAMUSCULAR | Status: DC | PRN
  Administered 2012-11-14 (×2): 50 ug via INTRAVENOUS

## 2012-11-14 MED ORDER — PROPOFOL 10 MG/ML IV EMUL
INTRAVENOUS | Status: AC
  Filled 2012-11-14: qty 20

## 2012-11-14 MED ORDER — ONDANSETRON HCL 4 MG/2ML IJ SOLN: 4.0000 mg | Freq: Once | INTRAMUSCULAR | Status: DC | PRN

## 2012-11-14 MED ORDER — DEXAMETHASONE SODIUM PHOSPHATE 4 MG/ML IJ SOLN
4.0000 mg | Freq: Once | INTRAMUSCULAR | Status: AC
  Administered 2012-11-14: 4 mg via INTRAVENOUS

## 2012-11-14 MED ORDER — MIDAZOLAM HCL 2 MG/2ML IJ SOLN
1.0000 mg | INTRAMUSCULAR | Status: DC | PRN
  Administered 2012-11-14 (×2): 2 mg via INTRAVENOUS

## 2012-11-14 MED ORDER — FENTANYL CITRATE 0.05 MG/ML IJ SOLN: 25.0000 ug | INTRAMUSCULAR | Status: DC | PRN

## 2012-11-14 MED ORDER — SODIUM CHLORIDE 0.9 % IR SOLN
Status: DC | PRN
  Administered 2012-11-14: 1000 mL
  Administered 2012-11-14: 3000 mL

## 2012-11-14 MED ORDER — ALBUTEROL SULFATE (5 MG/ML) 0.5% IN NEBU
INHALATION_SOLUTION | RESPIRATORY_TRACT | Status: AC
  Filled 2012-11-14: qty 0.5

## 2012-11-14 SURGICAL SUPPLY — 32 items
BAG HAMPER (MISCELLANEOUS) ×2 IMPLANT
BANDAGE CONFORM 3  STR LF (GAUZE/BANDAGES/DRESSINGS) ×1 IMPLANT
CLOTH BEACON ORANGE TIMEOUT ST (SAFETY) ×2 IMPLANT
COVER LIGHT HANDLE STERIS (MISCELLANEOUS) ×4 IMPLANT
ELECT REM PT RETURN 9FT ADLT (ELECTROSURGICAL) ×2
ELECTRODE REM PT RTRN 9FT ADLT (ELECTROSURGICAL) ×1 IMPLANT
GLOVE BIOGEL M STRL SZ7.5 (GLOVE) ×2 IMPLANT
GLOVE BIOGEL PI IND STRL 7.0 (GLOVE) IMPLANT
GLOVE BIOGEL PI INDICATOR 7.0 (GLOVE) ×1
GLOVE ECLIPSE 6.5 STRL STRAW (GLOVE) ×1 IMPLANT
GLOVE EXAM NITRILE LRG STRL (GLOVE) ×1 IMPLANT
GLOVE SS BIOGEL STRL SZ 6.5 (GLOVE) IMPLANT
GLOVE SUPERSENSE BIOGEL SZ 6.5 (GLOVE) ×1
GOWN STRL REIN XL XLG (GOWN DISPOSABLE) ×4 IMPLANT
HANDPIECE INTERPULSE COAX TIP (DISPOSABLE) ×2
IV NS IRRIG 3000ML ARTHROMATIC (IV SOLUTION) ×1 IMPLANT
KIT ROOM TURNOVER APOR (KITS) ×2 IMPLANT
MANIFOLD NEPTUNE II (INSTRUMENTS) ×2 IMPLANT
MARKER SKIN DUAL TIP RULER LAB (MISCELLANEOUS) ×2 IMPLANT
NS IRRIG 1000ML POUR BTL (IV SOLUTION) ×2 IMPLANT
PACK BASIC LIMB (CUSTOM PROCEDURE TRAY) IMPLANT
PACK MINOR (CUSTOM PROCEDURE TRAY) ×2 IMPLANT
PAD ABD 5X9 TENDERSORB (GAUZE/BANDAGES/DRESSINGS) ×1 IMPLANT
PAD ARMBOARD 7.5X6 YLW CONV (MISCELLANEOUS) ×2 IMPLANT
SET BASIN LINEN APH (SET/KITS/TRAYS/PACK) ×2 IMPLANT
SET HNDPC FAN SPRY TIP SCT (DISPOSABLE) IMPLANT
SPONGE GAUZE 4X4 12PLY (GAUZE/BANDAGES/DRESSINGS) IMPLANT
SPONGE LAP 18X18 X RAY DECT (DISPOSABLE) ×1 IMPLANT
SWAB CULTURE LIQ STUART DBL (MISCELLANEOUS) ×1 IMPLANT
SYR BULB IRRIGATION 50ML (SYRINGE) ×2 IMPLANT
TAPE MEDIFIX FOAM 3 (GAUZE/BANDAGES/DRESSINGS) ×1 IMPLANT
TUBE ANAEROBIC PORT A CUL  W/M (MISCELLANEOUS) ×1 IMPLANT

## 2012-11-14 NOTE — Anesthesia Procedure Notes (Addendum)
Procedure Name: LMA Insertion Date/Time: 11/14/2012 11:40 AM Performed by: Franco Nones Pre-anesthesia Checklist: Patient identified, Patient being monitored, Emergency Drugs available, Timeout performed and Suction available Patient Re-evaluated:Patient Re-evaluated prior to inductionOxygen Delivery Method: Circle System Utilized Preoxygenation: Pre-oxygenation with 100% oxygen Intubation Type: IV induction Ventilation: Mask ventilation without difficulty LMA: LMA inserted LMA Size: 4.0 Number of attempts: 1 Placement Confirmation: positive ETCO2 and breath sounds checked- equal and bilateral   Procedure Name: Intubation Date/Time: 11/14/2012 11:52 AM Performed by: Franco Nones Pre-anesthesia Checklist: Patient identified, Patient being monitored, Timeout performed, Emergency Drugs available and Suction available Patient Re-evaluated:Patient Re-evaluated prior to inductionOxygen Delivery Method: Circle System Utilized Preoxygenation: Pre-oxygenation with 100% oxygen Intubation Type: IV induction Ventilation: Mask ventilation with difficulty and Oral airway inserted - appropriate to patient size Laryngoscope Size: Miller and 2 Grade View: Grade I Tube type: Oral Tube size: 7.0 mm Number of attempts: 1 Airway Equipment and Method: stylet Placement Confirmation: ETT inserted through vocal cords under direct vision,  positive ETCO2 and breath sounds checked- equal and bilateral Secured at: 22 cm Tube secured with: Tape Dental Injury: Teeth and Oropharynx as per pre-operative assessment

## 2012-11-14 NOTE — Progress Notes (Signed)
Late Entry: 1930 Patient sitting up in bed in no apparent distress. Patient is alert with family at the bedside. Patient does report pain to groin rated 8/10. -pt. indicated she was attempting to hold out as long as she could wherein the pain is concerned -pt. advice against this practice and informed it can sometimes take a greater effort to get the pain under control when it is at its worst -pt. indicated understanding

## 2012-11-14 NOTE — Progress Notes (Signed)
Ann Lopez EAV:[id number] DOB: 07/24/1970 DOA: 11/08/2012 PCP: Elvina Sidle, MD   Subjective: This lady has a cough. She has no fever. She is due to have incision and drainage of the left inguinal abscesses today.           Physical Exam: Blood pressure 98/56, pulse 80, temperature 98.8 F (37.1 C), temperature source Oral, resp. rate 20, height 5\' 2"  (1.575 m), weight 95.255 kg (210 lb), SpO2 91.00%. She looks systemically well. She is not toxic or septic clinically. Heart sounds are present and normal without murmurs or added sounds. Lung fields are clear. She clearly has significant left groin abscess/cellulitis. She is alert and orientated.   Investigations:  Recent Results (from the past 240 hour(s))  CULTURE, BLOOD (ROUTINE X 2)     Status: None   Collection Time    11/08/12 11:40 AM      Result Value Range Status   Specimen Description BLOOD LEFT ANTECUBITAL   Final   Special Requests BOTTLES DRAWN AEROBIC ONLY 8CC   Final   Culture NO GROWTH 5 DAYS   Final   Report Status 11/13/2012 FINAL   Final  CULTURE, ROUTINE-ABSCESS     Status: None   Collection Time    11/08/12  1:15 PM      Result Value Range Status   Specimen Description ABSCESS LEFT GROIN   Final   Special Requests NONE   Final   Gram Stain     Final   Value: FEW WBC PRESENT, PREDOMINANTLY PMN     NO SQUAMOUS EPITHELIAL CELLS SEEN     FEW GRAM POSITIVE COCCI IN PAIRS     Performed at [facility]   Culture     Final   Value: MULTIPLE ORGANISMS PRESENT, NONE PREDOMINANT     Note: NO STAPHYLOCOCCUS AUREUS ISOLATED NO GROUP A STREP (S.PYOGENES) ISOLATED     Performed at [facility]   Report Status 11/11/2012 FINAL   Final  CULTURE, BLOOD (ROUTINE X 2)     Status: None   Collection Time    11/08/12  1:25 PM      Result Value Range Status   Specimen Description BLOOD LEFT ANTECUBITAL   Final   Special Requests BOTTLES DRAWN AEROBIC AND ANAEROBIC 8CC   Final   Culture NO GROWTH  5 DAYS   Final   Report Status 11/13/2012 FINAL   Final  SURGICAL PCR SCREEN     Status: None   Collection Time    11/13/12  4:25 PM      Result Value Range Status   MRSA, PCR NEGATIVE  NEGATIVE Final   Staphylococcus aureus NEGATIVE  NEGATIVE Final   Comment:            The Xpert SA Assay (FDA     approved for NASAL specimens     in patients over 79 years of age),     is one component of     a comprehensive surveillance     program.  Test performance has     been validated by [facility] for patients greater     than or equal to 58 year old.     It is not intended     to diagnose infection nor to     guide or monitor treatment.     Basic Metabolic Panel:  Recent Labs  78/29/56 0507 11/14/12 0548  NA 136 137  K 3.2* 3.3*  CL 101  99  CO2 28 28  GLUCOSE 111* 92  BUN 3* 3*  CREATININE 0.48* 0.55  CALCIUM 8.7 8.6       CBC:  Recent Labs  11/13/12 0507 11/14/12 0548  WBC 16.0* 15.2*  NEUTROABS  --  11.6*  HGB 9.7* 10.2*  HCT 29.3* 31.5*  MCV 85.2 85.1  PLT 279 364    [place] Extrem Low Left Ltd  11/12/2012   *RADIOLOGY REPORT*  Clinical Data: Left groin cellulitis, evaluate for abscess  ULTRASOUND LEFT LOWER EXTREMITY LIMITED  Technique:  Ultrasound examination of the region of interest in the left lower extremity was performed.  Comparison:  None.  Findings: Complex well defined fluid collection in the left groin measures 3.4 x 1.6 x 4.2 cm. There is a second more superficial fluid collection just under the skin surface and superficial adipose tissue which is less well defined and contains a mixture of complex fluid and echogenic reflectors likely representing gas.  IMPRESSION:  There are at least two complex fluid collections in the left groin concerning for abscesses.  The more superficial fluid collection is less well defined contains a mixture of complex fluid and gas.  The slightly deeper complex fluid collection is more well defined and measures 4.2 x 3.4 x  1.6 cm.  These results were called by telephone on 11/12/2012 at 12:35 p.m. to Dr. Franky Macho, who verbally acknowledged these results.   Original Report Authenticated By: Malachy Moan, M.D.      Medications: I have reviewed the patient's current medications.  Impression: 1. Community-acquired pneumonia, clinically stable from this point of view. 2. Left groin abscesses, for surgery today. 3. Diabetes mellitus. 4. Hypertension. 5. Obesity. 6. Hypokalemia.     Plan: 1. Continue with intravenous Levaquin and vancomycin. 2. Replete potassium with IV fluids. 3. Await surgery today.  Consultants:  Dr. Lovell Sheehan, surgery.   Procedures:  None.   Antibiotics:  Intravenous vancomycin started 11/10/2012.  Intravenous Levaquin started 11/08/2012.                  Code Status: Full code.  Family Communication: Discussed plan with patient at the bedside.   Disposition Plan: Home when medically stable.  Time spent: 15 minutes.   LOS: 6 days   Wilson Singer Pager [phone]  11/14/2012, 8:20 AM

## 2012-11-14 NOTE — Anesthesia Postprocedure Evaluation (Signed)
Anesthesia Post Note  Patient: Ann Lopez  Procedure(s) Performed: Procedure(s) (LRB): INCISION AND DRAINAGE ABSCESS (Left)  Anesthesia type: General  Patient location: PACU  Post pain: Pain level controlled  Post assessment: Post-op Vital signs reviewed, Patient's Cardiovascular Status Stable, Respiratory Function Stable, Patent Airway, No signs of Nausea or vomiting and Pain level controlled  Last Vitals:  Filed Vitals:   11/14/12 1235  BP: 109/62  Pulse: 106  Temp: 36.8 C  Resp: 22    Post vital signs: Reviewed and stable  Level of consciousness: awake and alert   Complications: No apparent anesthesia complications

## 2012-11-14 NOTE — Anesthesia Preprocedure Evaluation (Addendum)
Anesthesia Evaluation  Patient identified by MRN, date of birth, ID band Patient awake    Reviewed: Allergy & Precautions, H&P , NPO status , Patient's Chart, lab work & pertinent test results  Airway Mallampati: II TM Distance: >3 FB Neck ROM: Full    Dental  (+) Teeth Intact   Pulmonary shortness of breath, asthma , pneumonia -, Current Smoker,    + wheezing      Cardiovascular hypertension, Pt. on medications Rhythm:Regular Rate:Normal     Neuro/Psych PSYCHIATRIC DISORDERS Anxiety Depression  Neuromuscular disease    GI/Hepatic   Endo/Other  diabetes, Type 2, Insulin DependentMorbid obesity  Renal/GU      Musculoskeletal   Abdominal   Peds  Hematology   Anesthesia Other Findings   Reproductive/Obstetrics                          Anesthesia Physical Anesthesia Plan  ASA: III  Anesthesia Plan: General   Post-op Pain Management:    Induction: Intravenous  Airway Management Planned: LMA  Additional Equipment:   Intra-op Plan:   Post-operative Plan: Extubation in OR  Informed Consent: I have reviewed the patients History and Physical, chart, labs and discussed the procedure including the risks, benefits and alternatives for the proposed anesthesia with the patient or authorized representative who has indicated his/her understanding and acceptance.     Plan Discussed with:   Anesthesia Plan Comments:         Anesthesia Quick Evaluation

## 2012-11-14 NOTE — Transfer of Care (Signed)
Immediate Anesthesia Transfer of Care Note  Patient: Ann Lopez  Procedure(s) Performed: Procedure(s) (LRB): INCISION AND DRAINAGE ABSCESS (Left)  Patient Location: PACU  Anesthesia Type: General  Level of Consciousness: awake  Airway & Oxygen Therapy: Patient Spontanous Breathing and non-rebreather face mask  Post-op Assessment: Report given to PACU RN, Post -op Vital signs reviewed and stable and Patient moving all extremities  Post vital signs: Reviewed and stable  Complications: No apparent anesthesia complications

## 2012-11-14 NOTE — Progress Notes (Signed)
Albuterol nebulizer tx given as ordered, tolerated well.

## 2012-11-14 NOTE — Progress Notes (Signed)
Reported attempted to [name] in the ICU she stated she had the information she needed.  I told her to call me with any further questions.  She verbalized understanding.

## 2012-11-14 NOTE — Op Note (Signed)
Patient:  Ann Lopez  DOB:  [birth year]  MRN:  [id number]   Preop Diagnosis:  Abscess, left groin  Postop Diagnosis:  Same  Procedure:  Incision and drainage of abscess, left groin  Surgeon:  Franky Macho, M.D.  Anes:  General endotracheal  Indications:  Patient is a 42 year old obese white female who has had an indurated area along the left groin region with a small draining sinus. This has since worsened over the last few days and she now presents for incision and drainage of the left groin abscess. The risks and benefits of the procedure were fully explained to the patient, who gave informed consent.  Procedure note:  The patient was placed in the supine position. After general anesthesia was administered, the left groin region was prepped and draped using usual sterile technique with Betadine. Surgical site confirmation was performed.  The area involved was in a linear pattern along the left groin crease and pannus. It was greater than 15 cm in length. An incision was made along this area. Aerobic and anaerobic cultures were taken and sent to microbiology. Pulse lavage was used to irrigate the wound. A bleeding was controlled using Bovie electrocautery. Any necrotic tissue was debrided. The wound was packed with Betadine impregnated three-inch clean. A dry sterile dressing was then applied.  All tape and needle counts were correct the end of the procedure. The patient was extubated in the operating room and transferred to PACU in stable condition.    Complications:  None  EBL:  Minimal  Specimen:  Aerobic and anaerobic cultures of left groin abscess

## 2012-11-14 NOTE — Progress Notes (Signed)
ANTIBIOTIC CONSULT NOTE  Pharmacy Consult for Vancomycin Indication: Cellulitis  Allergies  Allergen Reactions  . Azithromycin     Unknown  . Morphine And Related     Can tolerate hydrocodone and dilaudid without side effects  . Nitrofurantoin     Unknown  . Penicillins Hives and Itching  . Sulfonamide Derivatives Hives   Patient Measurements: Height: 5\' 2"  (157.5 cm) Weight: 210 lb (95.255 kg) IBW/kg (Calculated) : 50.1  Vital Signs: Temp: 98.8 F (37.1 C) (08/08 0543) Temp src: Oral (08/08 0543) BP: 98/56 mmHg (08/08 0543) Pulse Rate: 80 (08/08 0543) Intake/Output from previous day: 08/07 0701 - 08/08 0700 In: 360 [P.O.:360] Out: 2650 [Urine:2650] Intake/Output from this shift:    Labs:  Recent Labs  11/12/12 0541 11/13/12 0507 11/14/12 0548  WBC 14.3* 16.0* 15.2*  HGB 9.2* 9.7* 10.2*  PLT 239 279 364  CREATININE 0.54 0.48* 0.55   Estimated Creatinine Clearance: 100.6 ml/min (by C-G formula based on Cr of 0.55).  Recent Labs  11/14/12 0548  VANCOTROUGH 13.4    Microbiology: Recent Results (from the past 720 hour(s))  CULTURE, BLOOD (ROUTINE X 2)     Status: None   Collection Time    11/08/12 11:40 AM      Result Value Range Status   Specimen Description BLOOD LEFT ANTECUBITAL   Final   Special Requests BOTTLES DRAWN AEROBIC ONLY 8CC   Final   Culture NO GROWTH 5 DAYS   Final   Report Status 11/13/2012 FINAL   Final  CULTURE, ROUTINE-ABSCESS     Status: None   Collection Time    11/08/12  1:15 PM      Result Value Range Status   Specimen Description ABSCESS LEFT GROIN   Final   Special Requests NONE   Final   Gram Stain     Final   Value: FEW WBC PRESENT, PREDOMINANTLY PMN     NO SQUAMOUS EPITHELIAL CELLS SEEN     FEW GRAM POSITIVE COCCI IN PAIRS     Performed at [facility]   Culture     Final   Value: MULTIPLE ORGANISMS PRESENT, NONE PREDOMINANT     Note: NO STAPHYLOCOCCUS AUREUS ISOLATED NO GROUP A STREP (S.PYOGENES) ISOLATED     Performed at [facility]   Report Status 11/11/2012 FINAL   Final  CULTURE, BLOOD (ROUTINE X 2)     Status: None   Collection Time    11/08/12  1:25 PM      Result Value Range Status   Specimen Description BLOOD LEFT ANTECUBITAL   Final   Special Requests BOTTLES DRAWN AEROBIC AND ANAEROBIC 8CC   Final   Culture NO GROWTH 5 DAYS   Final   Report Status 11/13/2012 FINAL   Final  SURGICAL PCR SCREEN     Status: None   Collection Time    11/13/12  4:25 PM      Result Value Range Status   MRSA, PCR NEGATIVE  NEGATIVE Final   Staphylococcus aureus NEGATIVE  NEGATIVE Final   Comment:            The Xpert SA Assay (FDA     approved for NASAL specimens     in patients over 46 years of age),     is one component of     a comprehensive surveillance     program.  Test performance has     been validated by [employer] for patients  greater     than or equal to 93 year old.     It is not intended     to diagnose infection nor to     guide or monitor treatment.   Medical History: Past Medical History  Diagnosis Date  . Diabetes mellitus   . Back pain   . Osteopenia   . Allergy   . Asthma   . Cancer   . Breast cancer   . Depression   . Anxiety   . Osteoporosis     osteopenia   Medications:  Scheduled:  . antiseptic oral rinse  15 mL Mouth Rinse BID  . enoxaparin (LOVENOX) injection  40 mg Subcutaneous Daily  . fluticasone  2 spray Each Nare Daily  . guaiFENesin  600 mg Oral BID  . insulin aspart  0-15 Units Subcutaneous TID WC  . insulin aspart  0-5 Units Subcutaneous QHS  . insulin aspart  30 Units Subcutaneous TID AC  . insulin glargine  20 Units Subcutaneous BID  . levofloxacin (LEVAQUIN) IV  750 mg Intravenous Q24H  . pantoprazole  40 mg Oral Daily  . polyethylene glycol  17 g Oral Daily  . sodium chloride  3 mL Intravenous Q12H  . vancomycin  1,250 mg Intravenous Q8H  . venlafaxine XR  150 mg Oral Q breakfast   Assessment: 42 yo F on day#7 Vancomycin  & Levaquin for left groin abscesses/cellulitis.   Renal fxn is stable.  Trough level at goal today.  Cultures are non-contributory. Plan for I&D today.   Vancomycin 8/2 >> Levaquin 8/2 >>  Goal of Therapy:  Vancomycin trough 12-20 mcg/ml  Plan:  Continue Vancomycin to 1250mg  IV every 8 hours. Measure antibiotic drug levels at steady state Follow up culture results, renal function, and patient progress Duration of therapy per MD  Elson Clan 11/14/2012,7:56 AM

## 2012-11-14 NOTE — Progress Notes (Signed)
Late Entry: 2130 Patient reported experiencing some SOA after ambulating to the toilet. -note: patient did not appear to be in any distress at the time of her complaint RT called and informed of the patient's current status.   2135 RT at the bedside.  2145 Patient is resting in apparent distress and watching television.

## 2012-11-14 NOTE — Progress Notes (Addendum)
Patient having heart rate in 40-50s since transfer to ICU. Her HR had been in 70-80 s since admission. She is asymptomatic, sitting up in bed taking with family. Dr Karilyn Cota notified by phone. No new orders at this time. Will continue to monitor.

## 2012-11-14 NOTE — Progress Notes (Signed)
Patient reports: -pain to LLE rated 7/10 Patient medicated.

## 2012-11-15 LAB — GLUCOSE, CAPILLARY
Glucose-Capillary: 62 mg/dL — ABNORMAL LOW (ref 70–99)
Glucose-Capillary: 69 mg/dL — ABNORMAL LOW (ref 70–99)
Glucose-Capillary: 71 mg/dL (ref 70–99)
Glucose-Capillary: 145 mg/dL — ABNORMAL HIGH (ref 70–99)

## 2012-11-15 LAB — COMPREHENSIVE METABOLIC PANEL
ALT: 21 U/L (ref 0–35)
AST: 24 U/L (ref 0–37)
Albumin: 2.2 g/dL — ABNORMAL LOW (ref 3.5–5.2)
Alkaline Phosphatase: 172 U/L — ABNORMAL HIGH (ref 39–117)
Potassium: 4.8 mEq/L (ref 3.5–5.1)
Sodium: 138 mEq/L (ref 135–145)
Total Protein: 6.3 g/dL (ref 6.0–8.3)

## 2012-11-15 LAB — CBC
MCHC: 32.4 g/dL (ref 30.0–36.0)
RDW: 14.5 % (ref 11.5–15.5)
WBC: 12.7 10*3/uL — ABNORMAL HIGH (ref 4.0–10.5)

## 2012-11-15 MED ORDER — INSULIN ASPART 100 UNIT/ML ~~LOC~~ SOLN
15.0000 [IU] | Freq: Once | SUBCUTANEOUS | Status: AC
  Administered 2012-11-15: 15 [IU] via SUBCUTANEOUS

## 2012-11-15 MED ORDER — DESVENLAFAXINE SUCCINATE ER 50 MG PO TB24
100.0000 mg | ORAL_TABLET | Freq: Every day | ORAL | Status: DC
  Administered 2012-11-15 – 2012-11-17 (×3): 100 mg via ORAL
  Filled 2012-11-15 (×2): qty 1
  Filled 2012-11-15 (×3): qty 2

## 2012-11-15 MED ORDER — TRAZODONE HCL 50 MG PO TABS: 50.0000 mg | ORAL_TABLET | Freq: Every evening | ORAL | Status: DC | PRN

## 2012-11-15 MED ORDER — CLONAZEPAM 0.5 MG PO TABS
1.0000 mg | ORAL_TABLET | Freq: Two times a day (BID) | ORAL | Status: DC
  Administered 2012-11-15 – 2012-11-17 (×5): 1 mg via ORAL
  Filled 2012-11-15 (×2): qty 1
  Filled 2012-11-15 (×4): qty 2

## 2012-11-15 NOTE — Anesthesia Postprocedure Evaluation (Signed)
Anesthesia Post Note  Patient: Ann Lopez  Procedure(s) Performed: Procedure(s) (LRB): INCISION AND DRAINAGE ABSCESS LEFT GROIN (Left)  Anesthesia type: General  Patient location:  ICU 1 Post pain: Pain level controlled  Post assessment: Post-op Vital signs reviewed, Patient's Cardiovascular Status Stable, Respiratory Function Stable, Patent Airway, No signs of Nausea or vomiting and Pain level controlled  Last Vitals:  Filed Vitals:   11/15/12 1015  BP: 118/63  Pulse: 41  Temp:   Resp: 22    Post vital signs: Reviewed and stable  Level of consciousness: awake and alert   Complications: No apparent anesthesia complications

## 2012-11-15 NOTE — Progress Notes (Signed)
Patient transferred to room 221. Report given to Lennie Odor. Vital signs stable. Bradycardia persists, but asymptomatic.

## 2012-11-15 NOTE — Progress Notes (Signed)
Hypoglycemic Event  CBG: 69  Treatment: 15 GM carbohydrate snack  Symptoms: None  Follow-up CBG: Time:17:17 CBG Result:71  Possible Reasons for Event: Inadequate meal intake  Comments/MD notified: MD notified    Ann Lopez R  Remember to initiate Hypoglycemia Order Set & complete

## 2012-11-15 NOTE — Progress Notes (Signed)
1 Day Post-Op  Subjective: Stable from respiratory standpoint.  Objective: Vital signs in last 24 hours: Temp:  [97.5 F (36.4 C)-98.5 F (36.9 C)] 97.6 F (36.4 C) (08/09 0800) Pulse Rate:  [35-106] 41 (08/09 1015) Resp:  [14-33] 22 (08/09 1015) BP: (85-132)/(42-83) 118/63 mmHg (08/09 1015) SpO2:  [92 %-100 %] 96 % (08/09 1015) Weight:  [90.5 kg (199 lb 8.3 oz)] 90.5 kg (199 lb 8.3 oz) (08/09 0500) Last BM Date: 11/11/12  Intake/Output from previous day: 08/08 0701 - 08/09 0700 In: 4303.8 [I.V.:2553.8; IV Piggyback:1750] Out: 50 [Blood:50] Intake/Output this shift:    General appearance: alert, cooperative, no distress and morbidly obese Skin: Left groin packing removed. No significant purulent drainage present. Erythema much less.  Lab Results:   Recent Labs  11/14/12 0548 11/15/12 0448  WBC 15.2* 12.7*  HGB 10.2* 9.5*  HCT 31.5* 29.3*  PLT 364 382   BMET  Recent Labs  11/14/12 0548 11/15/12 0448  NA 137 138  K 3.3* 4.8  CL 99 102  CO2 28 27  GLUCOSE 92 229*  BUN 3* 6  CREATININE 0.55 0.43*  CALCIUM 8.6 8.9   PT/INR No results found for this basename: LABPROT, INR,  in the last 72 hours  Studies/Results: Dg Chest 1 View  11/14/2012   *RADIOLOGY REPORT*  Clinical Data: Pneumonia follow up  CHEST - 1 VIEW  Comparison: 11/11/2010  Findings: Cardiomediastinal silhouette is stable.  The study is limited by poor inspiration.  Mild perihilar increased bronchial markings without focal infiltrate or pulmonary edema.  Surgical clips bilateral axilla.  IMPRESSION: Mild perihilar increased bronchial markings without segmental infiltrate or pulmonary edema.  Limited study by poor inspiration.   Original Report Authenticated By: Natasha Mead, M.D.    Anti-infectives: Anti-infectives   Start     Dose/Rate Route Frequency Ordered Stop   11/10/12 1500  vancomycin (VANCOCIN) 1,250 mg in sodium chloride 0.9 % 250 mL IVPB     1,250 mg 166.7 mL/hr over 90 Minutes  Intravenous Every 8 hours 11/10/12 0754     11/08/12 2300  vancomycin (VANCOCIN) IVPB 1000 mg/200 mL premix  Status:  Discontinued     1,000 mg 200 mL/hr over 60 Minutes Intravenous Every 8 hours 11/08/12 1447 11/10/12 0754   11/08/12 1330  vancomycin (VANCOCIN) IVPB 1000 mg/200 mL premix     1,000 mg 200 mL/hr over 60 Minutes Intravenous  Once 11/08/12 1319 11/08/12 1605   11/08/12 1130  levofloxacin (LEVAQUIN) IVPB 750 mg     750 mg 100 mL/hr over 90 Minutes Intravenous Every 24 hours 11/08/12 1119        Assessment/Plan: s/p Procedure(s): INCISION AND DRAINAGE ABSCESS LEFT GROIN Impression: Status post incision and drainage of left groin abscess. Initial Gram stain of cultures negative for bacteria. Wound care orders written. She will need home health for left groin wound care.  LOS: 7 days    Ann Lopez A 11/15/2012

## 2012-11-15 NOTE — Progress Notes (Signed)
Hypoglycemic Event  CBG: 62  Treatment: 15 GM carbohydrate snack  Symptoms: None  Follow-up CBG: Time:16:51 CBG Result:69  Possible Reasons for Event: Inadequate meal intake  Comments/MD notified: MD notified    Perlie Scheuring R  Remember to initiate Hypoglycemia Order Set & complete

## 2012-11-15 NOTE — Progress Notes (Addendum)
Ann Lopez EAV:[id number] DOB: Feb 14, 1971 DOA: 11/08/2012 PCP: Elvina Sidle, MD   Subjective: This lady underwent incision and drainage yesterday by Dr. Lovell Sheehan of the left groin abscesses. She was then transferred to the step down unit because of oxygen desaturation. She appears to been stable overnight. The wound was left open. She denies any dyspnea this morning.           Physical Exam: Blood pressure 116/73, pulse 36, temperature 97.5 F (36.4 C), temperature source Oral, resp. rate 16, height 5\' 2"  (1.575 m), weight 90.5 kg (199 lb 8.3 oz), SpO2 98.00%. She looks systemically well. She is not toxic or septic clinically. Heart sounds are present and normal without murmurs or added sounds. Lung fields are clear. There is no increased work of breathing.  She is alert and orientated.   Investigations:  Recent Results (from the past 240 hour(s))  CULTURE, BLOOD (ROUTINE X 2)     Status: None   Collection Time    11/08/12 11:40 AM      Result Value Range Status   Specimen Description BLOOD LEFT ANTECUBITAL   Final   Special Requests BOTTLES DRAWN AEROBIC ONLY 8CC   Final   Culture NO GROWTH 5 DAYS   Final   Report Status 11/13/2012 FINAL   Final  CULTURE, ROUTINE-ABSCESS     Status: None   Collection Time    11/08/12  1:15 PM      Result Value Range Status   Specimen Description ABSCESS LEFT GROIN   Final   Special Requests NONE   Final   Gram Stain     Final   Value: FEW WBC PRESENT, PREDOMINANTLY PMN     NO SQUAMOUS EPITHELIAL CELLS SEEN     FEW GRAM POSITIVE COCCI IN PAIRS     Performed at [facility]   Culture     Final   Value: MULTIPLE ORGANISMS PRESENT, NONE PREDOMINANT     Note: NO STAPHYLOCOCCUS AUREUS ISOLATED NO GROUP A STREP (S.PYOGENES) ISOLATED     Performed at [facility]   Report Status 11/11/2012 FINAL   Final  CULTURE, BLOOD (ROUTINE X 2)     Status: None   Collection Time    11/08/12  1:25 PM      Result Value Range Status    Specimen Description BLOOD LEFT ANTECUBITAL   Final   Special Requests BOTTLES DRAWN AEROBIC AND ANAEROBIC 8CC   Final   Culture NO GROWTH 5 DAYS   Final   Report Status 11/13/2012 FINAL   Final  SURGICAL PCR SCREEN     Status: None   Collection Time    11/13/12  4:25 PM      Result Value Range Status   MRSA, PCR NEGATIVE  NEGATIVE Final   Staphylococcus aureus NEGATIVE  NEGATIVE Final   Comment:            The Xpert SA Assay (FDA     approved for NASAL specimens     in patients over 3 years of age),     is one component of     a comprehensive surveillance     program.  Test performance has     been validated by [employer] for patients greater     than or equal to 30 year old.     It is not intended     to diagnose infection nor to     guide or monitor treatment.  CULTURE, ROUTINE-ABSCESS     Status: None   Collection Time    11/14/12 12:02 PM      Result Value Range Status   Specimen Description ABSCESS LEFT GROIN   Final   Special Requests VANCOMYCIN 1250mg    Final   Gram Stain     Final   Value: MODERATE WBC PRESENT, PREDOMINANTLY PMN     NO SQUAMOUS EPITHELIAL CELLS SEEN     NO ORGANISMS SEEN     Performed at [facility]   Culture     Final   Value: NO GROWTH 1 DAY     Performed at [facility]   Report Status PENDING   Incomplete  ANAEROBIC CULTURE     Status: None   Collection Time    11/14/12 12:02 PM      Result Value Range Status   Specimen Description ABSCESS LEFT GROIN   Final   Special Requests VANCOMYCIN 1250mg    Final   Gram Stain     Final   Value: MODERATE WBC PRESENT, PREDOMINANTLY PMN     NO SQUAMOUS EPITHELIAL CELLS SEEN     NO ORGANISMS SEEN     Performed at [facility]   Culture PENDING   Incomplete   Report Status PENDING   Incomplete     Basic Metabolic Panel:  Recent Labs  81/19/14 0548 11/15/12 0448  NA 137 138  K 3.3* 4.8  CL 99 102  CO2 28 27  GLUCOSE 92 229*  BUN 3* 6  CREATININE 0.55 0.43*   CALCIUM 8.6 8.9       CBC:  Recent Labs  11/14/12 0548 11/15/12 0448  WBC 15.2* 12.7*  NEUTROABS 11.6*  --   HGB 10.2* 9.5*  HCT 31.5* 29.3*  MCV 85.1 85.7  PLT 364 382    Dg Chest 1 View  11/14/2012   *RADIOLOGY REPORT*  Clinical Data: Pneumonia follow up  CHEST - 1 VIEW  Comparison: 11/11/2010  Findings: Cardiomediastinal silhouette is stable.  The study is limited by poor inspiration.  Mild perihilar increased bronchial markings without focal infiltrate or pulmonary edema.  Surgical clips bilateral axilla.  IMPRESSION: Mild perihilar increased bronchial markings without segmental infiltrate or pulmonary edema.  Limited study by poor inspiration.   Original Report Authenticated By: Natasha Mead, M.D.      Medications: I have reviewed the patient's current medications.  Impression: 1. Community-acquired pneumonia, clinically stable from this point of view. 2. Left groin abscesses, status post incision and drainage. 3. Diabetes mellitus. 4. Hypertension. 5. Obesity.      Plan: 1. Continue with intravenous Levaquin and vancomycin. 2. Reduce IV fluids and encourage oral intake. Check TSH. 3. Patient can transfer to the medical floor now. She should be encouraged to mobilize if okay with surgery.  Consultants:  Dr. Lovell Sheehan, surgery.   Procedures:  None.   Antibiotics:  Intravenous vancomycin started 11/10/2012.  Intravenous Levaquin started 11/08/2012.                  Code Status: Full code.  Family Communication: Discussed plan with patient at the bedside.   Disposition Plan: Home when medically stable.  Time spent: 15 minutes.   LOS: 7 days   Wilson Singer Pager [phone]  11/15/2012, 8:00 AM

## 2012-11-16 ENCOUNTER — Inpatient Hospital Stay (HOSPITAL_COMMUNITY): Payer: Medicare Other

## 2012-11-16 LAB — GLUCOSE, CAPILLARY: Glucose-Capillary: 99 mg/dL (ref 70–99)

## 2012-11-16 LAB — TSH: TSH: 1.882 u[IU]/mL (ref 0.350–4.500)

## 2012-11-16 LAB — COMPREHENSIVE METABOLIC PANEL
AST: 33 U/L (ref 0–37)
Albumin: 2.2 g/dL — ABNORMAL LOW (ref 3.5–5.2)
BUN: 8 mg/dL (ref 6–23)
Calcium: 8.6 mg/dL (ref 8.4–10.5)
Chloride: 104 mEq/L (ref 96–112)
Creatinine, Ser: 0.56 mg/dL (ref 0.50–1.10)
Total Protein: 6.1 g/dL (ref 6.0–8.3)

## 2012-11-16 LAB — CBC
HCT: 32.2 % — ABNORMAL LOW (ref 36.0–46.0)
MCH: 27.5 pg (ref 26.0–34.0)
MCHC: 32 g/dL (ref 30.0–36.0)
MCV: 86.1 fL (ref 78.0–100.0)
Platelets: 458 10*3/uL — ABNORMAL HIGH (ref 150–400)
RDW: 14.6 % (ref 11.5–15.5)
WBC: 13.6 10*3/uL — ABNORMAL HIGH (ref 4.0–10.5)

## 2012-11-16 MED ORDER — INSULIN GLARGINE 100 UNIT/ML ~~LOC~~ SOLN
20.0000 [IU] | Freq: Every day | SUBCUTANEOUS | Status: DC
  Administered 2012-11-16: 20 [IU] via SUBCUTANEOUS
  Filled 2012-11-16 (×2): qty 0.2

## 2012-11-16 MED ORDER — METFORMIN HCL 500 MG PO TABS
1000.0000 mg | ORAL_TABLET | Freq: Two times a day (BID) | ORAL | Status: DC
  Administered 2012-11-16 – 2012-11-17 (×3): 1000 mg via ORAL
  Filled 2012-11-16 (×3): qty 2

## 2012-11-16 NOTE — Progress Notes (Signed)
2 Days Post-Op  Subjective: No change.  Objective: Vital signs in last 24 hours: Temp:  [97.6 F (36.4 C)-97.7 F (36.5 C)] 97.6 F (36.4 C) (08/10 0601) Pulse Rate:  [44-45] 45 (08/10 0601) Resp:  [20-22] 20 (08/10 0601) BP: (96-112)/(56-73) 96/56 mmHg (08/10 0601) SpO2:  [93 %-95 %] 93 % (08/10 0601) Last BM Date: 11/11/12  Intake/Output from previous day: 08/09 0701 - 08/10 0700 In: 1798.3 [P.O.:480; I.V.:1318.3] Out: -  Intake/Output this shift: Total I/O In: 3 [I.V.:3] Out: -   General appearance: alert, cooperative and no distress Skin: Left groin wound healing by secondary intention. Granulation tissue noted at base. Some serosanguineous fluid and exudate present at base.  Lab Results:   Recent Labs  11/15/12 0448 11/16/12 0514  WBC 12.7* 13.6*  HGB 9.5* 10.3*  HCT 29.3* 32.2*  PLT 382 458*   BMET  Recent Labs  11/15/12 0448 11/16/12 0514  NA 138 139  K 4.8 3.9  CL 102 104  CO2 27 28  GLUCOSE 229* 136*  BUN 6 8  CREATININE 0.43* 0.56  CALCIUM 8.9 8.6   PT/INR No results found for this basename: LABPROT, INR,  in the last 72 hours  Studies/Results: Dg Chest 1 View  11/14/2012   *RADIOLOGY REPORT*  Clinical Data: Pneumonia follow up  CHEST - 1 VIEW  Comparison: 11/11/2010  Findings: Cardiomediastinal silhouette is stable.  The study is limited by poor inspiration.  Mild perihilar increased bronchial markings without focal infiltrate or pulmonary edema.  Surgical clips bilateral axilla.  IMPRESSION: Mild perihilar increased bronchial markings without segmental infiltrate or pulmonary edema.  Limited study by poor inspiration.   Original Report Authenticated By: Natasha Mead, M.D.    Anti-infectives: Anti-infectives   Start     Dose/Rate Route Frequency Ordered Stop   11/10/12 1500  vancomycin (VANCOCIN) 1,250 mg in sodium chloride 0.9 % 250 mL IVPB     1,250 mg 166.7 mL/hr over 90 Minutes Intravenous Every 8 hours 11/10/12 0754     11/08/12 2300   vancomycin (VANCOCIN) IVPB 1000 mg/200 mL premix  Status:  Discontinued     1,000 mg 200 mL/hr over 60 Minutes Intravenous Every 8 hours 11/08/12 1447 11/10/12 0754   11/08/12 1330  vancomycin (VANCOCIN) IVPB 1000 mg/200 mL premix     1,000 mg 200 mL/hr over 60 Minutes Intravenous  Once 11/08/12 1319 11/08/12 1605   11/08/12 1130  levofloxacin (LEVAQUIN) IVPB 750 mg     750 mg 100 mL/hr over 90 Minutes Intravenous Every 24 hours 11/08/12 1119        Assessment/Plan: s/p Procedure(s): INCISION AND DRAINAGE ABSCESS LEFT GROIN Impression: Left groin wound, healing by secondary intention. Cultures negative thus far. We'll get pulse lavage therapy tomorrow.  LOS: 8 days    Sultan Pargas A 11/16/2012

## 2012-11-16 NOTE — Progress Notes (Signed)
Ann Lopez [id number] DOB: 06/21/1970 DOA: 11/08/2012 PCP: Elvina Sidle, MD   Subjective: This lady underwent incision and drainage  by Dr. Lovell Sheehan of the left groin abscesses. She was then transferred to the step down unit because of oxygen desaturation. She was doing well yesterday and transferred back to the floor. She has been mobilizing more than she did previously. She is complaining of bilateral leg swelling, more on the left than the right. There is no chest pain or dyspnea of significance.           Physical Exam: Blood pressure 96/56, pulse 45, temperature 97.6 F (36.4 C), temperature source Oral, resp. rate 20, height 5\' 2"  (1.575 m), weight 90.5 kg (199 lb 8.3 oz), SpO2 93.00%. She looks systemically well. She is not toxic or septic clinically. Heart sounds are present and normal without murmurs or added sounds. Lung fields are clear. There is no increased work of breathing.  She is alert and orientated. Both her legs are swollen, possibly the left more than the right. She remains alert and orientated.   Investigations:  Recent Results (from the past 240 hour(s))  CULTURE, BLOOD (ROUTINE X 2)     Status: None   Collection Time    11/08/12 11:40 AM      Result Value Range Status   Specimen Description BLOOD LEFT ANTECUBITAL   Final   Special Requests BOTTLES DRAWN AEROBIC ONLY 8CC   Final   Culture NO GROWTH 5 DAYS   Final   Report Status 11/13/2012 FINAL   Final  CULTURE, ROUTINE-ABSCESS     Status: None   Collection Time    11/08/12  1:15 PM      Result Value Range Status   Specimen Description ABSCESS LEFT GROIN   Final   Special Requests NONE   Final   Gram Stain     Final   Value: FEW WBC PRESENT, PREDOMINANTLY PMN     NO SQUAMOUS EPITHELIAL CELLS SEEN     FEW GRAM POSITIVE COCCI IN PAIRS     Performed at [facility]   Culture     Final   Value: MULTIPLE ORGANISMS PRESENT, NONE PREDOMINANT     Note: NO STAPHYLOCOCCUS AUREUS ISOLATED NO  GROUP A STREP (S.PYOGENES) ISOLATED     Performed at [facility]   Report Status 11/11/2012 FINAL   Final  CULTURE, BLOOD (ROUTINE X 2)     Status: None   Collection Time    11/08/12  1:25 PM      Result Value Range Status   Specimen Description BLOOD LEFT ANTECUBITAL   Final   Special Requests BOTTLES DRAWN AEROBIC AND ANAEROBIC 8CC   Final   Culture NO GROWTH 5 DAYS   Final   Report Status 11/13/2012 FINAL   Final  SURGICAL PCR SCREEN     Status: None   Collection Time    11/13/12  4:25 PM      Result Value Range Status   MRSA, PCR NEGATIVE  NEGATIVE Final   Staphylococcus aureus NEGATIVE  NEGATIVE Final   Comment:            The Xpert SA Assay (FDA     approved for NASAL specimens     in patients over 8 years of age),     is one component of     a comprehensive surveillance     program.  Test performance has     been validated by [facility] for patients greater     than or equal to 73 year old.     It is not intended     to diagnose infection nor to     guide or monitor treatment.  CULTURE, ROUTINE-ABSCESS     Status: None   Collection Time    11/14/12 12:02 PM      Result Value Range Status   Specimen Description ABSCESS LEFT GROIN   Final   Special Requests VANCOMYCIN 1250mg    Final   Gram Stain     Final   Value: MODERATE WBC PRESENT, PREDOMINANTLY PMN     NO SQUAMOUS EPITHELIAL CELLS SEEN     NO ORGANISMS SEEN     Performed at [facility]   Culture     Final   Value: NO GROWTH 2 DAYS     Performed at [facility]   Report Status PENDING   Incomplete  ANAEROBIC CULTURE     Status: None   Collection Time    11/14/12 12:02 PM      Result Value Range Status   Specimen Description ABSCESS LEFT GROIN   Final   Special Requests VANCOMYCIN 1250mg    Final   Gram Stain     Final   Value: MODERATE WBC PRESENT, PREDOMINANTLY PMN     NO SQUAMOUS EPITHELIAL CELLS SEEN     NO ORGANISMS SEEN     Performed at [facility]   Culture      Final   Value: NO ANAEROBES ISOLATED; CULTURE IN PROGRESS FOR 5 DAYS     Performed at [facility]   Report Status PENDING   Incomplete     Basic Metabolic Panel:  Recent Labs  16/10/96 0448 11/16/12 0514  NA 138 139  K 4.8 3.9  CL 102 104  CO2 27 28  GLUCOSE 229* 136*  BUN 6 8  CREATININE 0.43* 0.56  CALCIUM 8.9 8.6       CBC:  Recent Labs  11/14/12 0548 11/15/12 0448 11/16/12 0514  WBC 15.2* 12.7* 13.6*  NEUTROABS 11.6*  --   --   HGB 10.2* 9.5* 10.3*  HCT 31.5* 29.3* 32.2*  MCV 85.1 85.7 86.1  PLT 364 382 458*    Dg Chest 1 View  11/14/2012   *RADIOLOGY REPORT*  Clinical Data: Pneumonia follow up  CHEST - 1 VIEW  Comparison: 11/11/2010  Findings: Cardiomediastinal silhouette is stable.  The study is limited by poor inspiration.  Mild perihilar increased bronchial markings without focal infiltrate or pulmonary edema.  Surgical clips bilateral axilla.  IMPRESSION: Mild perihilar increased bronchial markings without segmental infiltrate or pulmonary edema.  Limited study by poor inspiration.   Original Report Authenticated By: Natasha Mead, M.D.      Medications: I have reviewed the patient's current medications.  Impression: 1. Community-acquired pneumonia, clinically stable from this point of view. 2. Left groin abscesses, status post incision and drainage. 3. Diabetes mellitus. 4. Hypertension. 5. Obesity. 6. Bilateral leg swelling.      Plan: 1. Continue with intravenous Levaquin and vancomycin. 2. Bilateral venous Dopplers to make sure she does not have DVTs. 3. Physical therapy evaluation.  Consultants:  Dr. Lovell Sheehan, surgery.   Procedures:  None.   Antibiotics:  Intravenous vancomycin started 11/10/2012.  Intravenous Levaquin started 11/08/2012.                  Code Status: Full code.  Family Communication: Discussed plan with patient at the  bedside.   Disposition Plan: Home when medically stable.  Time spent: 15  minutes.   LOS: 8 days   Wilson Singer Pager [phone]  11/16/2012, 10:09 AM

## 2012-11-16 NOTE — Progress Notes (Signed)
Hypoglycemic Event  CBG: 51  Treatment: 15 GM carbohydrate snack  Symptoms: None  Follow-up CBG: Time:12:22 CBG Result:77  Possible Reasons for Event: Inadequate meal intake  Comments/MD notified: MD notified; lunch was on the way after the first blood sugar check and she was also given a 4 ounces of orange juice    Sueanne Maniaci R  Remember to initiate Hypoglycemia Order Set & complete

## 2012-11-17 ENCOUNTER — Encounter (HOSPITAL_COMMUNITY): Payer: Self-pay | Admitting: General Surgery

## 2012-11-17 ENCOUNTER — Emergency Department (HOSPITAL_COMMUNITY): Payer: Medicare Other

## 2012-11-17 ENCOUNTER — Other Ambulatory Visit: Payer: Self-pay

## 2012-11-17 ENCOUNTER — Inpatient Hospital Stay (HOSPITAL_COMMUNITY)
Admission: EM | Admit: 2012-11-17 | Discharge: 2012-11-20 | DRG: 641 | Disposition: A | Discharge status: Home Health | Payer: Medicare Other | Attending: Family Medicine | Admitting: Family Medicine

## 2012-11-17 DIAGNOSIS — R609 Edema, unspecified: Secondary | ICD-10-CM

## 2012-11-17 DIAGNOSIS — L02219 Cutaneous abscess of trunk, unspecified: Secondary | ICD-10-CM

## 2012-11-17 DIAGNOSIS — Z9884 Bariatric surgery status: Secondary | ICD-10-CM

## 2012-11-17 DIAGNOSIS — F411 Generalized anxiety disorder: Secondary | ICD-10-CM | POA: Diagnosis present

## 2012-11-17 DIAGNOSIS — E119 Type 2 diabetes mellitus without complications: Secondary | ICD-10-CM

## 2012-11-17 DIAGNOSIS — Z79899 Other long term (current) drug therapy: Secondary | ICD-10-CM

## 2012-11-17 DIAGNOSIS — L02214 Cutaneous abscess of groin: Secondary | ICD-10-CM

## 2012-11-17 DIAGNOSIS — L03319 Cellulitis of trunk, unspecified: Secondary | ICD-10-CM

## 2012-11-17 DIAGNOSIS — Z9010 Acquired absence of unspecified breast and nipple: Secondary | ICD-10-CM

## 2012-11-17 DIAGNOSIS — J189 Pneumonia, unspecified organism: Secondary | ICD-10-CM | POA: Diagnosis present

## 2012-11-17 DIAGNOSIS — R0989 Other specified symptoms and signs involving the circulatory and respiratory systems: Secondary | ICD-10-CM

## 2012-11-17 DIAGNOSIS — E440 Moderate protein-calorie malnutrition: Secondary | ICD-10-CM

## 2012-11-17 DIAGNOSIS — E8779 Other fluid overload: Principal | ICD-10-CM | POA: Diagnosis present

## 2012-11-17 DIAGNOSIS — K219 Gastro-esophageal reflux disease without esophagitis: Secondary | ICD-10-CM | POA: Diagnosis present

## 2012-11-17 DIAGNOSIS — I498 Other specified cardiac arrhythmias: Secondary | ICD-10-CM | POA: Diagnosis present

## 2012-11-17 DIAGNOSIS — R601 Generalized edema: Secondary | ICD-10-CM

## 2012-11-17 DIAGNOSIS — R0602 Shortness of breath: Secondary | ICD-10-CM

## 2012-11-17 DIAGNOSIS — R0600 Dyspnea, unspecified: Secondary | ICD-10-CM

## 2012-11-17 DIAGNOSIS — F3289 Other specified depressive episodes: Secondary | ICD-10-CM | POA: Diagnosis present

## 2012-11-17 DIAGNOSIS — R5381 Other malaise: Secondary | ICD-10-CM | POA: Diagnosis present

## 2012-11-17 DIAGNOSIS — Z853 Personal history of malignant neoplasm of breast: Secondary | ICD-10-CM

## 2012-11-17 DIAGNOSIS — F329 Major depressive disorder, single episode, unspecified: Secondary | ICD-10-CM | POA: Diagnosis present

## 2012-11-17 LAB — BASIC METABOLIC PANEL
BUN: 5 mg/dL — ABNORMAL LOW (ref 6–23)
Chloride: 103 mEq/L (ref 96–112)
GFR calc Af Amer: >90 mL/min (ref >90)
Glucose, Bld: 95 mg/dL (ref 70–99)
Potassium: 4.2 mEq/L (ref 3.5–5.1)
Sodium: 141 mEq/L (ref 135–145)

## 2012-11-17 LAB — CULTURE, ROUTINE-ABSCESS: Culture: NO GROWTH

## 2012-11-17 LAB — CBC
HCT: 34.4 % — ABNORMAL LOW (ref 36.0–46.0)
Hemoglobin: 11.7 g/dL — ABNORMAL LOW (ref 12.0–15.0)
RDW: 14.7 % (ref 11.5–15.5)
WBC: 12.6 10*3/uL — ABNORMAL HIGH (ref 4.0–10.5)

## 2012-11-17 MED ORDER — INSULIN GLARGINE 100 UNIT/ML ~~LOC~~ SOLN: 20.0000 [IU] | Freq: Every day | SUBCUTANEOUS | Status: DC

## 2012-11-17 MED ORDER — LEVOFLOXACIN 750 MG PO TABS: 750.0000 mg | ORAL_TABLET | Freq: Every day | ORAL | Status: DC

## 2012-11-17 MED ORDER — HYDROCODONE-ACETAMINOPHEN 7.5-325 MG PO TABS: 1.0000 | ORAL_TABLET | Freq: Three times a day (TID) | ORAL | Status: DC | PRN

## 2012-11-17 NOTE — Progress Notes (Signed)
Patient with orders to be discharge to home. Discharge instructions given, patient verbalized understanding. Prescriptions given. Patient in stable condition. Patient left with son in private vehicle.

## 2012-11-17 NOTE — ED Provider Notes (Signed)
CSN: [id number]     Arrival date & time 11/17/12  2101 History     First MD Initiated Contact with Patient 11/17/12 2340     Chief Complaint  Patient presents with  . Shortness of Breath  . Groin Pain   (Consider location/radiation/quality/duration/timing/severity/associated sxs/prior Treatment) HPI Hx per PT -  SOB and inc pain s/p I/D of abscess groin. Was discharged home from [facility] hospital today. She went home, took hydrocodone and now feel SOB, worse with walking/ She was admitted for PNA and is now taking Levaquin. No F/C, no CP, her cough is improving. She also c/o ankle swelling "all the way up my legs" which is new from her. Symptoms MOD in severity, pain is harp in quality.   Past Medical History  Diagnosis Date  . Diabetes mellitus   . Back pain   . Osteopenia   . Allergy   . Asthma   . Cancer   . Breast cancer   . Depression   . Anxiety   . Osteoporosis     osteopenia   Past Surgical History  Procedure Laterality Date  . Mastectomy    . Abdominal hysterectomy    . Lymphadenectomy    . Cesarean section    . Breast surgery    . Incision and drainage abscess Left 11/14/2012    Procedure: INCISION AND DRAINAGE ABSCESS LEFT GROIN;  Surgeon: Dalia Heading, MD;  Location: [facility];  Service: General;  Laterality: Left;   Family History  Problem Relation Age of Onset  . Cancer Mother 53    breast cancer  . Hypertension Sister   . Hypothyroidism Brother   . Cancer Maternal Grandmother 91    breast cancer  . Cancer Maternal Grandfather 55    pancreatic cancer   History  Substance Use Topics  . Smoking status: Current Some Day Smoker    Types: Cigarettes  . Smokeless tobacco: Not on file  . Alcohol Use: No   OB History   Grav Para Term Preterm Abortions TAB SAB Ect Mult Living                 Review of Systems  Constitutional: Negative for fever and chills.  HENT: Negative for neck pain and neck stiffness.   Eyes: Negative for visual disturbance.   Respiratory: Positive for shortness of breath.   Cardiovascular: Positive for leg swelling. Negative for chest pain.  Gastrointestinal: Negative for vomiting and abdominal pain.  Genitourinary: Negative for dysuria.  Musculoskeletal: Negative for back pain.  Skin: Positive for wound. Negative for rash.  Neurological: Negative for headaches.  All other systems reviewed and are negative.    Allergies  Azithromycin; Morphine and related; Nitrofurantoin; Penicillins; and Sulfonamide derivatives  Home Medications   Current Outpatient Rx  Name  Route  Sig  Dispense  Refill  . albuterol (PROVENTIL HFA;VENTOLIN HFA) 108 (90 BASE) MCG/ACT inhaler   Inhalation   Inhale 1 puff into the lungs every 6 (six) hours as needed for wheezing.   1 Inhaler   10   . ALPRAZolam (XANAX) 1 MG tablet      TAKE 1 TABLET (1MG  TOTAL) BY MOUTH AT BEDTIME AS NEEDED FOR SLEEP   90 tablet   0     CYCLE FILL MEDICATION. Authorization is required f ...   . clonazePAM (KLONOPIN) 1 MG tablet   Oral   Take 1 tablet (1 mg total) by mouth 2 (two) times daily as needed for anxiety.  180 tablet   1     Needs office visit   . desvenlafaxine (PRISTIQ) 100 MG 24 hr tablet   Oral   Take 1 tablet (100 mg total) by mouth daily.   90 tablet   3   . fluticasone (FLONASE) 50 MCG/ACT nasal spray   Nasal   Place 2 sprays into the nose daily.   1 g   12   . HYDROcodone-acetaminophen (NORCO) 7.5-325 MG per tablet   Oral   Take 1 tablet by mouth every 8 (eight) hours as needed for pain.   90 tablet   3   . insulin glargine (LANTUS) 100 UNIT/ML injection   Subcutaneous   Inject 0.2 mLs (20 Units total) into the skin daily.   10 mL   11   . levofloxacin (LEVAQUIN) 750 MG tablet   Oral   Take 1 tablet (750 mg total) by mouth daily.   10 tablet   0   . metFORMIN (GLUCOPHAGE) 500 MG tablet   Oral   Take 2 tablets (1,000 mg total) by mouth 2 (two) times daily with a meal.   240 tablet   3   .  omeprazole (PRILOSEC) 20 MG capsule   Oral   Take 1 capsule (20 mg total) by mouth daily.   30 capsule   1     PATIENT NEEDS OFFICE VISIT FOR ADDITIONAL REFILLS   . promethazine (PHENERGAN) 25 MG tablet   Oral   Take 1 tablet (25 mg total) by mouth every 6 (six) hours as needed for nausea.   30 tablet   0   . Zoledronic Acid (ZOMETA IV)   Intravenous   Inject into the vein. Every 6 months          BP 127/64  Pulse 65  Temp(Src) 98 F (36.7 C)  Resp 14  Ht 5\' 2"  (1.575 m)  Wt 199 lb (90.266 kg)  BMI 36.39 kg/m2  SpO2 95% Physical Exam  Constitutional: She is oriented to person, place, and time. She appears well-developed and well-nourished.  HENT:  Head: Normocephalic and atraumatic.  Eyes: EOM are normal. Pupils are equal, round, and reactive to light.  Neck: Neck supple.  Cardiovascular: Regular rhythm and intact distal pulses.   bradycardic  Pulmonary/Chest: Effort normal. No respiratory distress.  Mildly dec bilat breath sounds  Abdominal: Soft. She exhibits no distension. There is no tenderness.  Genitourinary:  L Groin abscess with packing in place  Musculoskeletal: Normal range of motion.  BLE edema non pitting  Neurological: She is alert and oriented to person, place, and time.  Skin: Skin is warm and dry.    ED Course   Procedures (including critical care time)  Labs Reviewed  CBC - Abnormal; Notable for the following:    WBC 12.6 (*)    Hemoglobin 11.7 (*)    HCT 34.4 (*)    Platelets 455 (*)    All other components within normal limits  BASIC METABOLIC PANEL - Abnormal; Notable for the following:    BUN 5 (*)    All other components within normal limits  CBC  PRO B NATRIURETIC PEPTIDE   Dg Chest 2 View  11/17/2012   *RADIOLOGY REPORT*  Clinical Data: Shortness of breath  CHEST - 2 VIEW  Comparison: 11/14/12, 11/08/2012  Findings: Mild enlargement of the cardiac silhouette is re- identified with central vascular congestion.  No overt edema.  Persistent hypoaeration with mild improvement at the lung bases. Trace pleural  fluid.  No focal pulmonary opacity.  IMPRESSION: No focal opacity allowing for hypoaeration.  Trace pleural effusions.   Original Report Authenticated By: Christiana Pellant, M.D.   US Venous Img Lower Bilateral  11/16/2012   *RADIOLOGY REPORT*  Clinical Data: Bilateral lower extremity swelling.  BILATERAL LOWER EXTREMITY VENOUS DUPLEX ULTRASOUND  Technique:  Gray-scale sonography with graded compression, as well as color Doppler and duplex ultrasound, were performed to evaluate the deep venous system of both lower extremities from the level of the common femoral vein through the popliteal and proximal calf veins.  Spectral Doppler was utilized to evaluate flow at rest and with distal augmentation maneuvers.  Comparison:  None  Findings:  Normal compressibility of bilateral common femoral, superficial femoral, and popliteal veins is demonstrated, as well as the visualized proximal calf veins.  No filling defects to suggest DVT on grayscale or color Doppler imaging.  Doppler waveforms show normal direction of venous flow, normal respiratory phasicity and response to augmentation.  IMPRESSION: No evidence of deep vein thrombosis in either lower extremity.   Original Report Authenticated By: Harmon Pier, M.D.    Date: 11/18/2012  Rate: 43  Rhythm: sinus bradycardia  QRS Axis: normal  Intervals: normal  ST/T Wave abnormalities: nonspecific ST changes  Conduction Disutrbances:none  Narrative Interpretation:   Old EKG Reviewed: changes noted more bradycardic versus prior  12:08 AM d/w [facility] resident on call, plan admit OBS, BNP  Pending and will check pulse ox with ambulation if PT is able to.    MDM  Dyspnea and persistent pain found to be bradycardic  Pleural effusions on CXR  ECG and labs as above, improved leukocytosis is still on ABx  MED admit     Sunnie Nielsen, MD 11/18/12 [phone]

## 2012-11-17 NOTE — ED Notes (Addendum)
Discharged post I&D of left groin and pneumonia today from [facility]. Presents with increase in left groin pain and continued SOB. "they just pulled me off oxygen and sent me out the door. I was getting pain medication every 2 hours and they just sent me with one prescription. I was in the hospital for over one week" bilateral breath sounds clear. Bilateral leg swelling, non pitting, pt is tearful.

## 2012-11-17 NOTE — Progress Notes (Signed)
Physical Therapy Wound Treatment Patient Details  Name: Ann Lopez MRN: [id number] Date of Birth: [birth year]  Today's Date: 11/17/2012 Time:  -     Subjective  Subjective: Pt states that this wound started as a very small area of inflammation over her left hip about 6 weeks ago.  She states that it has gradually enlarged to the point where it is now. Patient and Family Stated Goals: wound free Date of Onset: 10/07/12  Pain Score: Pain Score: 8   Wound Assessment  Wound 11/08/12 Other (Comment) Groin Left red area under the left abdomen - groin area (Active)  Site / Wound Assessment Painful;Pink 11/17/2012 11:22 AM  % Wound base Red or Granulating 100% 11/16/2012  8:30 PM  Peri-wound Assessment Induration;Erythema (blanchable) 11/17/2012 11:22 AM  Drainage Amount Minimal 11/17/2012 11:22 AM  Drainage Description Serosanguineous 11/16/2012  8:30 PM  Treatment Other (Comment) 11/14/2012  8:00 PM  Dressing Type Moist to moist;Tape dressing 11/17/2012 11:22 AM  Dressing Changed Changed 11/16/2012  8:30 PM  Dressing Status New drainage 11/16/2012  8:30 PM     Incision 11/14/12 Groin Left (Active)  Site / Wound Assessment Clean 11/17/2012 11:22 AM  Incision Length (cm) 12.5 cm 11/17/2012 11:22 AM  Margins Unattacted edges (unapproximated) 11/17/2012 11:22 AM  Closure None 11/17/2012 11:22 AM  Drainage Amount Moderate 11/17/2012 11:22 AM  Drainage Description Serosanguineous 11/17/2012 11:22 AM  Dressing Type ABD;Tape dressing;Moist to moist 11/17/2012 11:22 AM  Dressing Changed 11/17/2012 11:22 AM   Hydrotherapy Pulsed lavage therapy - wound location: Surgical wound is present over left lateral groin Pulsed Lavage with Suction (psi): 4 psi Pulsed Lavage with Suction - Normal Saline Used: 1000 mL Pulsed Lavage Tip: Tip with splash shield   Wound Assessment and Plan  Wound Therapy - Assess/Plan/Recommendations Wound Therapy - Clinical Statement: Pt underwent I & D of left groin infection on  11-14-12.  The wound is fully granulated with no eschar evident.  There is erythema over the left buttock and pt c/o mederate to severe pain.  There is tunneling at medial and lateral segments of the wound ( 6 cm laterally, 3 cm medially) .  Pulse Lavage was done to the wound and it was packed with saline soaked 3" conform gauze.  It was covered with 4" gauze pads and then ABD pad.  Pt was instructed in how to replace outer dressing if it should come off at home.  She appears to understand teaching. Wound Therapy - Functional Problem List: wound creates painful mobility, especially in transfers in/out of bed Factors Delaying/Impairing Wound Healing: Diabetes Mellitus Hydrotherapy Plan: Dressing change;Patient/family education;Pulsatile lavage with suction Wound Therapy - Frequency: 3X / week Wound Therapy - Current Recommendations: PT Wound Therapy - Follow Up Recommendations: Wound Care Center Wound Plan: pulse lavage, moist to moist dressing, cover with ABD pad  Wound Therapy Goals- Improve the function of patient's integumentary system by progressing the wound(s) through the phases of wound healing (inflammation - proliferation - remodeling) by: Decrease Length/Width/Depth by (cm): decrease tunneling to 0 cm  Goals will be updated until maximal potential achieved or discharge criteria met.  Discharge criteria: when goals achieved, discharge from hospital, MD decision/surgical intervention, no progress towards goals, refusal/missing three consecutive treatments without notification or medical reason.  GP     Myrlene Broker L 11/17/2012, 11:40 AM

## 2012-11-17 NOTE — Progress Notes (Signed)
UR chart review completed.  

## 2012-11-17 NOTE — ED Notes (Signed)
Patient left from [facility], treated for PNA, abscess drain, pain not under control with home medication.  SOB, Pain 9/10, 2+ pitting edema BLE, Patient crying.  Has packed dressing on LLQ Abdomen, has follow up appt on 8/14

## 2012-11-17 NOTE — Discharge Summary (Signed)
Physician Discharge Summary  Ann Lopez:[id number] DOB: [birth year] DOA: 11/08/2012  PCP: Elvina Sidle, MD  Admit date: 11/08/2012 Discharge date: 11/17/2012  Time spent: Greater than 30 minutes  Recommendations for Outpatient Follow-up:  1. Follow with Dr. Lovell Sheehan, surgery in approximately one week. 2. Follow with primary care physician in 2 weeks.  3. Home health care for care of postoperative wound.  Discharge Diagnoses:  1. Multiple abscesses left groin, status post incision and drainage by Dr. Lovell Sheehan on 11/14/2012. 2. Community acquired pneumonia right lung, clinically improved. 3. Type 2 diabetes mellitus, controlled, medications have been adjusted. 4. Sepsis syndrome secondary to #1 and #2, improved. 5. Obesity, morbid.  Discharge Condition: Stable and improved.  Diet recommendation: Carbohydrate modified diet.  Filed Weights   11/08/12 0953 11/08/12 1403 11/15/12 0500  Weight: 95.255 kg (210 lb) 95.255 kg (210 lb) 90.5 kg (199 lb 8.3 oz)    History of present illness:  This 42 year old lady presented to the hospital with symptoms of cough and abscess in the left groin. Please see initial history as outlined below: HPI: Ann Lopez is a 42 y.o. female with history of diabetes presents to the emergency room with several complaints. She reports noticing a coin sized lesion in her left groin approximately 4 weeks ago. She had shown this to her oncologist, who did not feel that it was anything worrisome. Suddenly, 2 weeks ago she noticed that this lesion was increasing in size. Over the last week she has started to have fevers and has noted that this lesion has increased in size and had become tender, warm and red. She had initially presented to the [facility] ED and was given a prescription for doxycycline. She reports trying to take this, but felt nauseous and was vomiting. She went to [facility] hospital for evaluation where they changed her antibiotics to clindamycin and  told her that if her symptoms did not improve, then she may need a surgical consult. She continued to have fevers and pain. She noted that last night her lesion began to drain bloody, foul smelling discharge. She also started developing cough overnight, as well as shortness of breath. She denies any diarrhea, dysuria. She has had nausea and vomiting. She has a history of breast cancer 5 years ago and had undergone mastectomy. She had a PET scan 2 weeks ago at [facility] and reports that this was read as "clear". Other prior surgeries include sleeve gastrectomy approximately a year ago.  She was evaluated in the ED and was found to be febrile, have a significant leukocytosis, CXR consistent with developing pneumonia and an abscess. She has been referred for admission.  Hospital Course:  This lady was admitted and started on intravenous antibiotics for her pneumonia and also was clearly abscess in the left groin. Ultrasound confirmed multiple abscesses. She was seen by surgery, Dr. Lovell Sheehan who took her to the operating room on August 8 and performed incision and drainage. He has been managing postoperative care with local wound care. She is continued on antibiotics. Her pneumonia clinically is much improved. Her diabetes has been very well controlled, in fact we have had to cut back on her doses of home medications, I suspect that she has been brought in noncompliant with nutrition, requiring higher doses at home. She is now stable for discharge and she will need close followup with Dr. Lovell Sheehan, surgery as well as home health care, which we will arrange. She will need a further ten-day course of antibiotics and  I have given her prescription for this.  Procedures:  None.  Consultations:  Surgery, Dr. Lovell Sheehan.  Discharge Exam: Filed Vitals:   11/17/12 0555  BP: 112/67  Pulse: 49  Temp: 98.3 F (36.8 C)  Resp: 18    General: She looks systemically well. Cardiovascular: Heart sounds are  present without murmurs or added sounds. Respiratory: Lung fields clear. She is alert and orientated without any focal neurological signs. The left groin area has been dressed today.  Discharge Instructions  Discharge Orders   Future Orders Complete By Expires     Diet - low sodium heart healthy  As directed     Increase activity slowly  As directed         Medication List    STOP taking these medications       clindamycin 300 MG capsule  Commonly known as:  CLEOCIN     insulin aspart 100 UNIT/ML injection  Commonly known as:  novoLOG      TAKE these medications       albuterol 108 (90 BASE) MCG/ACT inhaler  Commonly known as:  PROVENTIL HFA;VENTOLIN HFA  Inhale 1 puff into the lungs every 6 (six) hours as needed for wheezing.     ALPRAZolam 1 MG tablet  Commonly known as:  XANAX  TAKE 1 TABLET (1MG  TOTAL) BY MOUTH AT BEDTIME AS NEEDED FOR SLEEP     clonazePAM 1 MG tablet  Commonly known as:  KLONOPIN  Take 1 tablet (1 mg total) by mouth 2 (two) times daily as needed for anxiety.     desvenlafaxine 100 MG 24 hr tablet  Commonly known as:  PRISTIQ  Take 1 tablet (100 mg total) by mouth daily.     fluticasone 50 MCG/ACT nasal spray  Commonly known as:  FLONASE  Place 2 sprays into the nose daily.     HYDROcodone-acetaminophen 7.5-325 MG per tablet  Commonly known as:  NORCO  Take 1 tablet by mouth every 8 (eight) hours as needed for pain.     insulin glargine 100 UNIT/ML injection  Commonly known as:  LANTUS  Inject 0.2 mLs (20 Units total) into the skin daily.     levofloxacin 750 MG tablet  Commonly known as:  LEVAQUIN  Take 1 tablet (750 mg total) by mouth daily.     metFORMIN 500 MG tablet  Commonly known as:  GLUCOPHAGE  Take 2 tablets (1,000 mg total) by mouth 2 (two) times daily with a meal.     omeprazole 20 MG capsule  Commonly known as:  PRILOSEC  Take 1 capsule (20 mg total) by mouth daily.     ONE TOUCH ULTRA 2 W/DEVICE Kit  USE AS DIRECTED      promethazine 25 MG tablet  Commonly known as:  PHENERGAN  Take 1 tablet (25 mg total) by mouth every 6 (six) hours as needed for nausea.     ZOMETA IV  Inject into the vein. Every 6 months       Allergies  Allergen Reactions  . Azithromycin     Unknown  . Morphine And Related     Can tolerate hydrocodone and dilaudid without side effects  . Nitrofurantoin     Unknown  . Penicillins Hives and Itching  . Sulfonamide Derivatives Hives       Follow-up Information   Follow up with Franky Macho A, MD. Schedule an appointment as soon as possible for a visit in 1 week.   Contact information:   [id number] [place] [address] [phone]       Follow up with Elvina Sidle, MD. Schedule an appointment as soon as possible for a visit in 2 weeks.   Contact information:   [address] [place] [address] [phone]        The results of significant diagnostics from this hospitalization (including imaging, microbiology, ancillary and laboratory) are listed below for reference.    Significant Diagnostic Studies: Dg Chest 1 View  11/14/2012   *RADIOLOGY REPORT*  Clinical Data: Pneumonia follow up  CHEST - 1 VIEW  Comparison: 11/11/2010  Findings: Cardiomediastinal silhouette is stable.  The study is limited by poor inspiration.  Mild perihilar increased bronchial markings without focal infiltrate or pulmonary edema.  Surgical clips bilateral axilla.  IMPRESSION: Mild perihilar increased bronchial markings without segmental infiltrate or pulmonary edema.  Limited study by poor inspiration.   Original Report Authenticated By: Natasha Mead, M.D.   Dg Chest 2 View  11/08/2012   *RADIOLOGY REPORT*  Clinical Data: Cough.  Fever.  CHEST - 2 VIEW  Comparison: Chest x-ray 09/25/2012.  Findings: There are ill-defined areas of interstitial prominence and subtle hazy airspace disease in the right mid to lower lung, which may represent early changes of bronchopneumonia. These  are difficult to localize on the lateral projection.  The lungs are otherwise clear.  No pleural effusions.  No evidence of pulmonary edema.  Heart size is normal. The patient is rotated to the right on today's exam, resulting in distortion of the mediastinal contours and reduced diagnostic sensitivity and specificity for mediastinal pathology.  IMPRESSION: 1.  Findings, as above, concerning for early changes of bronchopneumonia in the right mid to lower lung.   Original Report Authenticated By: Trudie Reed, M.D.   US Venous Img Lower Bilateral  11/16/2012   *RADIOLOGY REPORT*  Clinical Data: Bilateral lower extremity swelling.  BILATERAL LOWER EXTREMITY VENOUS DUPLEX ULTRASOUND  Technique:  Gray-scale sonography with graded compression, as well as color Doppler and duplex ultrasound, were performed to evaluate the deep venous system of both lower extremities from the level of the common femoral vein through the popliteal and proximal calf veins.  Spectral Doppler was utilized to evaluate flow at rest and with distal augmentation maneuvers.  Comparison:  None  Findings:  Normal compressibility of bilateral common femoral, superficial femoral, and popliteal veins is demonstrated, as well as the visualized proximal calf veins.  No filling defects to suggest DVT on grayscale or color Doppler imaging.  Doppler waveforms show normal direction of venous flow, normal respiratory phasicity and response to augmentation.  IMPRESSION: No evidence of deep vein thrombosis in either lower extremity.   Original Report Authenticated By: Harmon Pier, M.D.   [place] Extrem Low Left Ltd  11/12/2012   *RADIOLOGY REPORT*  Clinical Data: Left groin cellulitis, evaluate for abscess  ULTRASOUND LEFT LOWER EXTREMITY LIMITED  Technique:  Ultrasound examination of the region of interest in the left lower extremity was performed.  Comparison:  None.  Findings: Complex well defined fluid collection in the left groin measures 3.4 x 1.6 x 4.2  cm. There is a second more superficial fluid collection just under the skin surface and superficial adipose tissue which is less well defined and contains a mixture of complex fluid and echogenic reflectors likely representing gas.  IMPRESSION:  There are at least two complex fluid collections in the left groin concerning for abscesses.  The more superficial fluid collection is less well defined contains a mixture of complex fluid  and gas.  The slightly deeper complex fluid collection is more well defined and measures 4.2 x 3.4 x 1.6 cm.  These results were called by telephone on 11/12/2012 at 12:35 p.m. to Dr. Franky Macho, who verbally acknowledged these results.   Original Report Authenticated By: Malachy Moan, M.D.    Microbiology: Recent Results (from the past 240 hour(s))  CULTURE, BLOOD (ROUTINE X 2)     Status: None   Collection Time    11/08/12 11:40 AM      Result Value Range Status   Specimen Description BLOOD LEFT ANTECUBITAL   Final   Special Requests BOTTLES DRAWN AEROBIC ONLY 8CC   Final   Culture NO GROWTH 5 DAYS   Final   Report Status 11/13/2012 FINAL   Final  CULTURE, ROUTINE-ABSCESS     Status: None   Collection Time    11/08/12  1:15 PM      Result Value Range Status   Specimen Description ABSCESS LEFT GROIN   Final   Special Requests NONE   Final   Gram Stain     Final   Value: FEW WBC PRESENT, PREDOMINANTLY PMN     NO SQUAMOUS EPITHELIAL CELLS SEEN     FEW GRAM POSITIVE COCCI IN PAIRS     Performed at [facility]   Culture     Final   Value: MULTIPLE ORGANISMS PRESENT, NONE PREDOMINANT     Note: NO STAPHYLOCOCCUS AUREUS ISOLATED NO GROUP A STREP (S.PYOGENES) ISOLATED     Performed at [facility]   Report Status 11/11/2012 FINAL   Final  CULTURE, BLOOD (ROUTINE X 2)     Status: None   Collection Time    11/08/12  1:25 PM      Result Value Range Status   Specimen Description BLOOD LEFT ANTECUBITAL   Final   Special Requests BOTTLES DRAWN  AEROBIC AND ANAEROBIC 8CC   Final   Culture NO GROWTH 5 DAYS   Final   Report Status 11/13/2012 FINAL   Final  SURGICAL PCR SCREEN     Status: None   Collection Time    11/13/12  4:25 PM      Result Value Range Status   MRSA, PCR NEGATIVE  NEGATIVE Final   Staphylococcus aureus NEGATIVE  NEGATIVE Final   Comment:            The Xpert SA Assay (FDA     approved for NASAL specimens     in patients over 1 years of age),     is one component of     a comprehensive surveillance     program.  Test performance has     been validated by [employer] for patients greater     than or equal to 53 year old.     It is not intended     to diagnose infection nor to     guide or monitor treatment.  CULTURE, ROUTINE-ABSCESS     Status: None   Collection Time    11/14/12 12:02 PM      Result Value Range Status   Specimen Description ABSCESS LEFT GROIN   Final   Special Requests VANCOMYCIN 1250mg    Final   Gram Stain     Final   Value: MODERATE WBC PRESENT, PREDOMINANTLY PMN     NO SQUAMOUS EPITHELIAL CELLS SEEN     NO ORGANISMS SEEN     Performed at [facility]  Culture     Final   Value: NO GROWTH 3 DAYS     Performed at [facility]   Report Status 11/17/2012 FINAL   Final  ANAEROBIC CULTURE     Status: None   Collection Time    11/14/12 12:02 PM      Result Value Range Status   Specimen Description ABSCESS LEFT GROIN   Final   Special Requests VANCOMYCIN 1250mg    Final   Gram Stain     Final   Value: MODERATE WBC PRESENT, PREDOMINANTLY PMN     NO SQUAMOUS EPITHELIAL CELLS SEEN     NO ORGANISMS SEEN     Performed at [facility]   Culture     Final   Value: NO ANAEROBES ISOLATED; CULTURE IN PROGRESS FOR 5 DAYS     Performed at [facility]   Report Status PENDING   Incomplete     Labs: Basic Metabolic Panel:  Recent Labs Lab 11/12/12 0541 11/13/12 0507 11/14/12 0548 11/15/12 0448 11/16/12 0514  NA 135 136 137 138 139  K 3.3* 3.2*  3.3* 4.8 3.9  CL 99 101 99 102 104  CO2 27 28 28 27 28   GLUCOSE 151* 111* 92 229* 136*  BUN 4* 3* 3* 6 8  CREATININE 0.54 0.48* 0.55 0.43* 0.56  CALCIUM 8.5 8.7 8.6 8.9 8.6   Liver Function Tests:  Recent Labs Lab 11/15/12 0448 11/16/12 0514  AST 24 33  ALT 21 24  ALKPHOS 172* 180*  BILITOT 0.7 0.6  PROT 6.3 6.1  ALBUMIN 2.2* 2.2*     CBC:  Recent Labs Lab 11/12/12 0541 11/13/12 0507 11/14/12 0548 11/15/12 0448 11/16/12 0514  WBC 14.3* 16.0* 15.2* 12.7* 13.6*  NEUTROABS  --   --  11.6*  --   --   HGB 9.2* 9.7* 10.2* 9.5* 10.3*  HCT 27.8* 29.3* 31.5* 29.3* 32.2*  MCV 84.5 85.2 85.1 85.7 86.1  PLT 239 279 364 382 458*    CBG:  Recent Labs Lab 11/16/12 0749 11/16/12 1146 11/16/12 1221 11/16/12 1635 11/16/12 2058  GLUCAP 140* 51* 77 99 86       Signed:  Tynesha Free C  [facility] 11/17/2012, 8:54 AM

## 2012-11-17 NOTE — Progress Notes (Signed)
ANTIBIOTIC CONSULT NOTE  Pharmacy Consult for Vancomycin Indication: Cellulitis  Allergies  Allergen Reactions  . Azithromycin     Unknown  . Morphine And Related     Can tolerate hydrocodone and dilaudid without side effects  . Nitrofurantoin     Unknown  . Penicillins Hives and Itching  . Sulfonamide Derivatives Hives   Patient Measurements: Height: 5\' 2"  (157.5 cm) Weight: 199 lb 8.3 oz (90.5 kg) IBW/kg (Calculated) : 50.1  Vital Signs: Temp: 98.3 F (36.8 C) (08/11 0555) Temp src: Oral (08/11 0555) BP: 112/67 mmHg (08/11 0555) Pulse Rate: 49 (08/11 0555) Intake/Output from previous day: 08/10 0701 - 08/11 0700 In: 843 [P.O.:840; I.V.:3] Out: -  Intake/Output from this shift:    Labs:  Recent Labs  11/15/12 0448 11/16/12 0514  WBC 12.7* 13.6*  HGB 9.5* 10.3*  PLT 382 458*  CREATININE 0.43* 0.56   Estimated Creatinine Clearance: 97.8 ml/min (by C-G formula based on Cr of 0.56). No results found for this basename: VANCOTROUGH, Leodis Binet, VANCORANDOM, GENTTROUGH, GENTPEAK, GENTRANDOM, TOBRATROUGH, TOBRAPEAK, TOBRARND, AMIKACINPEAK, AMIKACINTROU, AMIKACIN,  in the last 72 hours  Microbiology: Recent Results (from the past 720 hour(s))  CULTURE, BLOOD (ROUTINE X 2)     Status: None   Collection Time    11/08/12 11:40 AM      Result Value Range Status   Specimen Description BLOOD LEFT ANTECUBITAL   Final   Special Requests BOTTLES DRAWN AEROBIC ONLY 8CC   Final   Culture NO GROWTH 5 DAYS   Final   Report Status 11/13/2012 FINAL   Final  CULTURE, ROUTINE-ABSCESS     Status: None   Collection Time    11/08/12  1:15 PM      Result Value Range Status   Specimen Description ABSCESS LEFT GROIN   Final   Special Requests NONE   Final   Gram Stain     Final   Value: FEW WBC PRESENT, PREDOMINANTLY PMN     NO SQUAMOUS EPITHELIAL CELLS SEEN     FEW GRAM POSITIVE COCCI IN PAIRS     Performed at [facility]   Culture     Final   Value: MULTIPLE ORGANISMS  PRESENT, NONE PREDOMINANT     Note: NO STAPHYLOCOCCUS AUREUS ISOLATED NO GROUP A STREP (S.PYOGENES) ISOLATED     Performed at [facility]   Report Status 11/11/2012 FINAL   Final  CULTURE, BLOOD (ROUTINE X 2)     Status: None   Collection Time    11/08/12  1:25 PM      Result Value Range Status   Specimen Description BLOOD LEFT ANTECUBITAL   Final   Special Requests BOTTLES DRAWN AEROBIC AND ANAEROBIC 8CC   Final   Culture NO GROWTH 5 DAYS   Final   Report Status 11/13/2012 FINAL   Final  SURGICAL PCR SCREEN     Status: None   Collection Time    11/13/12  4:25 PM      Result Value Range Status   MRSA, PCR NEGATIVE  NEGATIVE Final   Staphylococcus aureus NEGATIVE  NEGATIVE Final   Comment:            The Xpert SA Assay (FDA     approved for NASAL specimens     in patients over 44 years of age),     is one component of     a comprehensive surveillance     program.  Test performance has  been validated by [facility] for patients greater     than or equal to 79 year old.     It is not intended     to diagnose infection nor to     guide or monitor treatment.  CULTURE, ROUTINE-ABSCESS     Status: None   Collection Time    11/14/12 12:02 PM      Result Value Range Status   Specimen Description ABSCESS LEFT GROIN   Final   Special Requests VANCOMYCIN 1250mg    Final   Gram Stain     Final   Value: MODERATE WBC PRESENT, PREDOMINANTLY PMN     NO SQUAMOUS EPITHELIAL CELLS SEEN     NO ORGANISMS SEEN     Performed at [facility]   Culture     Final   Value: NO GROWTH 3 DAYS     Performed at [facility]   Report Status 11/17/2012 FINAL   Final  ANAEROBIC CULTURE     Status: None   Collection Time    11/14/12 12:02 PM      Result Value Range Status   Specimen Description ABSCESS LEFT GROIN   Final   Special Requests VANCOMYCIN 1250mg    Final   Gram Stain     Final   Value: MODERATE WBC PRESENT, PREDOMINANTLY PMN     NO SQUAMOUS EPITHELIAL CELLS  SEEN     NO ORGANISMS SEEN     Performed at [facility]   Culture     Final   Value: NO ANAEROBES ISOLATED; CULTURE IN PROGRESS FOR 5 DAYS     Performed at [facility]   Report Status PENDING   Incomplete   Medical History: Past Medical History  Diagnosis Date  . Diabetes mellitus   . Back pain   . Osteopenia   . Allergy   . Asthma   . Cancer   . Breast cancer   . Depression   . Anxiety   . Osteoporosis     osteopenia   Medications:  Scheduled:  . antiseptic oral rinse  15 mL Mouth Rinse BID  . clonazePAM  1 mg Oral BID  . desvenlafaxine  100 mg Oral Daily  . enoxaparin (LOVENOX) injection  40 mg Subcutaneous Daily  . fluticasone  2 spray Each Nare Daily  . guaiFENesin  600 mg Oral BID  . insulin aspart  0-15 Units Subcutaneous TID WC  . insulin aspart  0-5 Units Subcutaneous QHS  . insulin glargine  20 Units Subcutaneous QHS  . levofloxacin (LEVAQUIN) IV  750 mg Intravenous Q24H  . metFORMIN  1,000 mg Oral BID WC  . pantoprazole  40 mg Oral Daily  . polyethylene glycol  17 g Oral Daily  . sodium chloride  3 mL Intravenous Q12H  . vancomycin  1,250 mg Intravenous Q8H   Assessment: 42 yo F on day#10 Vancomycin & Levaquin for left groin abscesses/cellulitis.   Renal fxn is stable.  Trough level at goal.  Cultures are non-contributory.  Vancomycin 8/2 >> Levaquin 8/2 >>  Goal of Therapy:  Vancomycin trough 12-20 mcg/ml  Plan:  Continue Vancomycin 1250mg  IV every 8 hours. Measure antibiotic drug levels at steady state Follow up culture results, renal function, and patient progress Duration of therapy per MD  Valrie Hart A 11/17/2012,8:59 AM

## 2012-11-18 ENCOUNTER — Encounter (HOSPITAL_COMMUNITY): Payer: Self-pay | Admitting: *Deleted

## 2012-11-18 DIAGNOSIS — Z9884 Bariatric surgery status: Secondary | ICD-10-CM

## 2012-11-18 DIAGNOSIS — R601 Generalized edema: Secondary | ICD-10-CM | POA: Diagnosis present

## 2012-11-18 DIAGNOSIS — R0602 Shortness of breath: Secondary | ICD-10-CM

## 2012-11-18 LAB — URINALYSIS, ROUTINE W REFLEX MICROSCOPIC
Nitrite: NEGATIVE
Specific Gravity, Urine: 1.012 (ref 1.005–1.030)
Urobilinogen, UA: 1 mg/dL (ref 0.0–1.0)

## 2012-11-18 LAB — COMPREHENSIVE METABOLIC PANEL
Alkaline Phosphatase: 124 U/L — ABNORMAL HIGH (ref 39–117)
BUN: 5 mg/dL — ABNORMAL LOW (ref 6–23)
Chloride: 101 mEq/L (ref 96–112)
GFR calc Af Amer: >90 mL/min (ref >90)
Glucose, Bld: 111 mg/dL — ABNORMAL HIGH (ref 70–99)
Potassium: 3.3 mEq/L — ABNORMAL LOW (ref 3.5–5.1)
Total Bilirubin: 0.5 mg/dL (ref 0.3–1.2)
Total Protein: 6 g/dL (ref 6.0–8.3)

## 2012-11-18 LAB — CBC
HCT: 35 % — ABNORMAL LOW (ref 36.0–46.0)
Hemoglobin: 11.8 g/dL — ABNORMAL LOW (ref 12.0–15.0)
MCHC: 33.7 g/dL (ref 30.0–36.0)
Platelets: 415 10*3/uL — ABNORMAL HIGH (ref 150–400)
RBC: 4.21 MIL/uL (ref 3.87–5.11)
RDW: 14.7 % (ref 11.5–15.5)
WBC: 10.6 10*3/uL — ABNORMAL HIGH (ref 4.0–10.5)

## 2012-11-18 LAB — GLUCOSE, CAPILLARY: Glucose-Capillary: 117 mg/dL — ABNORMAL HIGH (ref 70–99)

## 2012-11-18 LAB — POCT I-STAT, CHEM 8
BUN: <3 mg/dL — ABNORMAL LOW (ref 6–23)
Chloride: 101 mEq/L (ref 96–112)
Creatinine, Ser: 0.7 mg/dL (ref 0.50–1.10)
Sodium: 141 mEq/L (ref 135–145)
TCO2: 28 mmol/L (ref 0–100)

## 2012-11-18 LAB — URINE MICROSCOPIC-ADD ON

## 2012-11-18 LAB — POCT I-STAT TROPONIN I: Troponin i, poc: 0 ng/mL (ref 0.00–0.08)

## 2012-11-18 MED ORDER — GLUCERNA SHAKE PO LIQD
237.0000 mL | Freq: Every day | ORAL | Status: DC
  Administered 2012-11-18: 237 mL via ORAL

## 2012-11-18 MED ORDER — ENOXAPARIN SODIUM 60 MG/0.6ML ~~LOC~~ SOLN
50.0000 mg | SUBCUTANEOUS | Status: DC
  Administered 2012-11-19 – 2012-11-20 (×2): 50 mg via SUBCUTANEOUS
  Filled 2012-11-18 (×3): qty 0.6

## 2012-11-18 MED ORDER — ACETAMINOPHEN 650 MG RE SUPP: 650.0000 mg | Freq: Four times a day (QID) | RECTAL | Status: DC | PRN

## 2012-11-18 MED ORDER — INSULIN ASPART 100 UNIT/ML ~~LOC~~ SOLN
0.0000 [IU] | Freq: Three times a day (TID) | SUBCUTANEOUS | Status: DC
  Administered 2012-11-19 (×2): 2 [IU] via SUBCUTANEOUS

## 2012-11-18 MED ORDER — VENLAFAXINE HCL ER 150 MG PO CP24
150.0000 mg | ORAL_CAPSULE | Freq: Every day | ORAL | Status: DC
  Administered 2012-11-18 – 2012-11-20 (×3): 150 mg via ORAL
  Filled 2012-11-18 (×4): qty 1

## 2012-11-18 MED ORDER — POTASSIUM CHLORIDE CRYS ER 20 MEQ PO TBCR
20.0000 meq | EXTENDED_RELEASE_TABLET | Freq: Once | ORAL | Status: AC
  Administered 2012-11-19: 20 meq via ORAL
  Filled 2012-11-18: qty 1

## 2012-11-18 MED ORDER — FUROSEMIDE 10 MG/ML IJ SOLN
40.0000 mg | Freq: Once | INTRAMUSCULAR | Status: AC
  Administered 2012-11-19: 40 mg via INTRAVENOUS

## 2012-11-18 MED ORDER — ONDANSETRON HCL 4 MG PO TABS: 4.0000 mg | ORAL_TABLET | Freq: Four times a day (QID) | ORAL | Status: DC | PRN

## 2012-11-18 MED ORDER — INSULIN GLARGINE 100 UNIT/ML ~~LOC~~ SOLN
20.0000 [IU] | Freq: Every day | SUBCUTANEOUS | Status: DC
  Administered 2012-11-18 – 2012-11-20 (×3): 20 [IU] via SUBCUTANEOUS
  Filled 2012-11-18 (×3): qty 0.2

## 2012-11-18 MED ORDER — ACETAMINOPHEN 325 MG PO TABS: 650.0000 mg | ORAL_TABLET | Freq: Four times a day (QID) | ORAL | Status: DC | PRN

## 2012-11-18 MED ORDER — ONDANSETRON HCL 4 MG/2ML IJ SOLN: 4.0000 mg | Freq: Four times a day (QID) | INTRAMUSCULAR | Status: DC | PRN

## 2012-11-18 MED ORDER — INSULIN ASPART 100 UNIT/ML ~~LOC~~ SOLN: 0.0000 [IU] | Freq: Every day | SUBCUTANEOUS | Status: DC

## 2012-11-18 MED ORDER — CLONAZEPAM 0.5 MG PO TABS: 1.0000 mg | ORAL_TABLET | Freq: Three times a day (TID) | ORAL | Status: DC | PRN

## 2012-11-18 MED ORDER — ENOXAPARIN SODIUM 40 MG/0.4ML ~~LOC~~ SOLN: 40.0000 mg | SUBCUTANEOUS | Status: DC

## 2012-11-18 MED ORDER — PRO-STAT SUGAR FREE PO LIQD
30.0000 mL | Freq: Two times a day (BID) | ORAL | Status: DC
  Administered 2012-11-18 – 2012-11-20 (×5): 30 mL via ORAL
  Filled 2012-11-18 (×6): qty 30

## 2012-11-18 MED ORDER — HYDROCODONE-ACETAMINOPHEN 5-325 MG PO TABS
2.0000 | ORAL_TABLET | ORAL | Status: DC | PRN
  Administered 2012-11-18 – 2012-11-20 (×9): 2 via ORAL
  Filled 2012-11-18 (×11): qty 2

## 2012-11-18 MED ORDER — ADULT MULTIVITAMIN W/MINERALS CH
1.0000 | ORAL_TABLET | Freq: Every day | ORAL | Status: DC
  Administered 2012-11-18 – 2012-11-20 (×3): 1 via ORAL
  Filled 2012-11-18 (×3): qty 1

## 2012-11-18 MED ORDER — PANTOPRAZOLE SODIUM 40 MG PO TBEC
40.0000 mg | DELAYED_RELEASE_TABLET | Freq: Every day | ORAL | Status: DC
  Administered 2012-11-18 – 2012-11-20 (×3): 40 mg via ORAL
  Filled 2012-11-18 (×3): qty 1

## 2012-11-18 MED ORDER — HYDROCODONE-ACETAMINOPHEN 5-325 MG PO TABS
2.0000 | ORAL_TABLET | Freq: Four times a day (QID) | ORAL | Status: DC | PRN
  Administered 2012-11-18: 2 via ORAL
  Filled 2012-11-18: qty 2

## 2012-11-18 MED ORDER — SODIUM CHLORIDE 0.9 % IJ SOLN
3.0000 mL | Freq: Two times a day (BID) | INTRAMUSCULAR | Status: DC
  Administered 2012-11-18 – 2012-11-20 (×6): 3 mL via INTRAVENOUS

## 2012-11-18 MED ORDER — FUROSEMIDE 10 MG/ML IJ SOLN
40.0000 mg | Freq: Once | INTRAMUSCULAR | Status: AC
  Administered 2012-11-18: 40 mg via INTRAVENOUS
  Filled 2012-11-18: qty 4

## 2012-11-18 MED ORDER — SODIUM CHLORIDE 0.9 % IV SOLN: 250.0000 mL | INTRAVENOUS | Status: DC | PRN

## 2012-11-18 MED ORDER — SODIUM CHLORIDE 0.9 % IJ SOLN: 3.0000 mL | INTRAMUSCULAR | Status: DC | PRN

## 2012-11-18 MED ORDER — SODIUM CHLORIDE 0.9 % IJ SOLN
3.0000 mL | Freq: Two times a day (BID) | INTRAMUSCULAR | Status: DC
  Administered 2012-11-19: 3 mL via INTRAVENOUS

## 2012-11-18 MED ORDER — LEVOFLOXACIN IN D5W 750 MG/150ML IV SOLN
750.0000 mg | INTRAVENOUS | Status: DC
  Administered 2012-11-18: 750 mg via INTRAVENOUS
  Filled 2012-11-18 (×2): qty 150

## 2012-11-18 NOTE — H&P (Signed)
Seen and examined.  Discussed with Dr. Casper Harrison.  Agree with this admit and his documentation and management.  Briefly 42 yo female admitted just hours after a hospitalization for groin abscess and pneumonia.  Admitted because of swelling and SOB.  Issues. 1. Anasarca.  Likely due to over vigorous iv hydration.  If we can correlate home and hospital weights, her dry weight is 199 and she was 235 on admit.  No Hx of CHF, liver or renal disease.  Agree with check TSH and echo.  Diuresis 2. Dyspnea.  Likely secondary to #1 plus deconditioning.  Needs PT. 3. Groin abscess, clean and dry.  Wound care has seen and recommended vac for deep pocket. 4. Protein calorie malnutrition, moderate.  Can't go by weights due to fluid retention.  Low protein noted.  Poor appetite since ill for past 2 weeks. 5. Pneumonia seems to be resolved.

## 2012-11-18 NOTE — Progress Notes (Signed)
Echo Lab  2D Echocardiogram completed.  Aprile Dickenson L Shirel Mallis, RDCS 11/18/2012 10:20 AM

## 2012-11-18 NOTE — Consult Note (Addendum)
WOC consult Note Reason for Consult: evaluation of wound left groin. This patient underwent I&D at [facility] 11/09/12, was discharged with plans for follow up with outpatient PT for wound care and hydrotherapy at [facility]. She has returned to [facility] with current complaints of increasing groin pain and SOB.  Packing removed without difficulty.  Wound type: surgical Pressure Ulcer POA: No Measurement: 11cm x 2cm x 4cm with tunnel at 3 o'clock that is 7cm and tunnel at 9 o'clock that is 5 cm. Wound FAO:ZHYQM with some evidence of cautery in the base, non necrotic, some slight skin slough at the wound edges.  Drainage (amount, consistency, odor) minimal serosanguinous, non purulent. Periwound: intact, she does have some erythema at the wound edge most distal on the hip but it is not indurated and the patient reports it has improved. Dressing procedure/placement/frequency: Will start NPWT VAC for this wound, it will promote closure and manage exudate, encourage granulation tissue. Explained dressing change and rationale for dressing change to the patient, she is in agreement with this plan.  Discussed POC with Dr. Leveda Anna and with CM department for discharge planning. Will need HHRN for VAC dressing changes at discharge.   WOC will follow along with you for [facility] dressing changes, due to the tunneling of this wound.  Teresa Nicodemus RN,CWOCN [phone]  1300 NPWT dressing placed to the left groin, pt premedicated with PO pain meds prior to dressing placement.  1pc of black granufoam placed in the wound bed with foam cut to facilitate packing to the tunneled areas. Seal obtained at . Pt tolerated well.  Nethan Caudillo [facility] RN,CWOCN

## 2012-11-18 NOTE — ED Notes (Signed)
Pt ambulated with stand by assist in room. Maintained o2 stats of 97-100% RA. Pt had difficulty due to pain. Pt tearful during ambulation.

## 2012-11-18 NOTE — Evaluation (Signed)
Physical Therapy Evaluation Patient Details Name: Ann Lopez MRN: [id number] DOB: 1970-05-12 Today's Date: 11/18/2012 Time: 5621-3086 PT Time Calculation (min): 20 min  PT Assessment / Plan / Recommendation History of Present Illness  Ann y.o. year old female presenting with shortness of breath, pain in recent left groin wound (I&D at [facility] by Dr. Lovell Sheehan of surgery), and anasarca. No prior history of heart failure. PMH significant for DM type II, depression, hx of gastric bypass surgery, hx of breast cancer s/p bilateral mastectomy; discharged 8/Ann from [facility] for groin abscesses and CAP with sepsis.  Clinical Impression  Pt progressing well. Pt tolerating ambulation. Suspect pt to be safe for d/c home without PT follow once medically stable.     PT Assessment  Patient needs continued PT services    Follow Up Recommendations  No PT follow up;Supervision - Intermittent    Does the patient have the potential to tolerate intense rehabilitation      Barriers to Discharge        Equipment Recommendations  None recommended by PT    Recommendations for Other Services     Frequency Min 3X/week    Precautions / Restrictions Precautions Precaution Comments: wound vac to L groin Restrictions Weight Bearing Restrictions: No   Pertinent Vitals/Pain 7/10 L groin pain      Mobility  Bed Mobility Bed Mobility: Supine to Sit Supine to Sit: 6: Modified independent (Device/Increase time);With rails;HOB elevated Details for Bed Mobility Assistance: definite use of bed rail Transfers Transfers: Sit to Stand;Stand to Sit Sit to Stand: 4: Min guard;With upper extremity assist;From bed Stand to Sit: 4: Min guard;With upper extremity assist;To chair/3-in-1 Details for Transfer Assistance: definite use of hands Ambulation/Gait Ambulation/Gait Assistance: 4: Min guard Ambulation Distance (Feet): 150 Feet Assistive device: None Ambulation/Gait Assistance Details: pt  occasionally held onto hallway rails Gait Pattern: Step-through pattern;Decreased stride length Gait velocity: decreased Stairs: No    Exercises     PT Diagnosis: Difficulty walking  PT Problem List: Decreased strength;Decreased activity tolerance;Decreased mobility PT Treatment Interventions: Gait training;Stair training;Functional mobility training;Therapeutic activities;Therapeutic exercise     PT Goals(Current goals can be found in the care plan section) Acute Rehab PT Goals Patient Stated Goal: home PT Goal Formulation: With patient Time For Goal Achievement: 11/25/12 Potential to Achieve Goals: Good  Visit Information  Last PT Received On: 11/18/12 Assistance Needed: +1 History of Present Illness: Ann y.o. year old female presenting with shortness of breath, pain in recent left groin wound (I&D at [facility] by Dr. Lovell Sheehan of surgery), and anasarca. No prior history of heart failure. PMH significant for DM type II, depression, hx of gastric bypass surgery, hx of breast cancer s/p bilateral mastectomy; discharged 8/Ann from [facility] for groin abscesses and CAP with sepsis.       Prior Functioning  Home Living Family/patient expects to be discharged to:: Private residence Living Arrangements: Children Available Help at Discharge: Available 24 hours/day (children) Type of Home: Mobile home Home Access: Stairs to enter [employer] of Steps: 3 Entrance Stairs-Rails: Can reach both Home Layout: One level Prior Function Level of Independence: Independent Communication Communication: No difficulties Dominant Hand: Right    Cognition  Cognition Arousal/Alertness: Awake/alert Behavior During Therapy: WFL for tasks assessed/performed Overall Cognitive Status: Within Functional Limits for tasks assessed    Extremity/Trunk Assessment Upper Extremity Assessment Upper Extremity Assessment: Overall WFL for tasks assessed Lower Extremity Assessment Lower Extremity  Assessment: LLE deficits/detail LLE Deficits / Details: not tested  due to pain/wound vac at groin Cervical / Trunk Assessment Cervical / Trunk Assessment: Normal   Balance    End of Session PT - End of Session Equipment Utilized During Treatment: Gait belt Activity Tolerance: Patient tolerated treatment well Patient left: in bed;with call bell/phone within reach Nurse Communication: Mobility status  GP     Marcene Brawn 11/18/2012, 3:35 PM  Lewis Shock, PT, DPT Pager #: [phone] Office #: [phone]

## 2012-11-18 NOTE — Progress Notes (Signed)
INITIAL NUTRITION ASSESSMENT  DOCUMENTATION CODES Per approved criteria  -Severe malnutrition in the context of acute illness or injury -Morbid Obesity   INTERVENTION: Add 30 ml Prostat po BID, each supplement provides 100 kcal and 15 grams protein. Add Glucerna Shake po daily, each supplement provides 220 kcal and 10 grams of protein. MVI daily. Due to hx of gastric bypass, recommend additional supplementation of calcium citrate with vitamin D. RD to continue to follow nutrition care plan.  NUTRITION DIAGNOSIS: Inadequate oral intake related to poor appetite as evidenced by dietary recall.   Goal: Intake to meet >90% of estimated nutrition needs.  Monitor:  weight trends, lab trends, I/O's, PO intake, supplement tolerance  Reason for Assessment: MD Consult  42 y.o. female  Admitting Dx: Anasarca  ASSESSMENT: PMHx significant for DMII, depression, hx of gastric bypass and breast cancer. Admitted with SOB, recent L groin wound (I&D at [facility]), and anasarca.  MD suspects that her anasarca is a result of over-aggressive fluid resuscitation at [facility] and possible poor nutrition. Pt confirms that she was eating "next to nothing" for the past two weeks. Pt is very sleepy during my interview, falling asleep several times. Pt states that she had gastric bypass surgery in March of 2013, highest weight prior to surgery was approximately 250-260 lb.  WOC RN saw pt this morning for surgical L groin wound. Plan to place [facility] soon.  Currently ordered for Carbohydrate Modified Medium diet.  RD attempted to complete physical assessment however pt with significant volume overload and it is difficult to distinguish fat/muscle mass loss.  Pt meets criteria for severe MALNUTRITION in the context of acute illness as evidenced by <50% of estimated energy intake x at least 5 days and moderate to severe fluid accumulation.   Height: Ht Readings from Last 1 Encounters:  11/18/12 5\' 2"  (1.575 m)     Weight: Wt Readings from Last 1 Encounters:  11/18/12 229 lb 14.4 oz (104.282 kg)  Dry weight is approximately 199 lb  Ideal Body Weight: 110 lb  % Ideal Body Weight: 208%  Wt Readings from Last 20 Encounters:  11/18/12 229 lb 14.4 oz (104.282 kg)  11/15/12 199 lb 8.3 oz (90.5 kg)  11/15/12 199 lb 8.3 oz (90.5 kg)  11/06/12 210 lb (95.255 kg)  11/05/12 210 lb (95.255 kg)  09/25/12 216 lb (97.977 kg)  08/28/12 216 lb 3.2 oz (98.068 kg)  07/09/12 210 lb (95.255 kg)  03/08/12 201 lb (91.173 kg)  12/25/11 195 lb (88.451 kg)  11/28/11 205 lb (92.987 kg)  11/14/11 204 lb (92.534 kg)  10/26/11 203 lb (92.08 kg)  09/15/11 198 lb (89.812 kg)  09/10/11 206 lb 12.8 oz (93.804 kg)  09/02/11 209 lb (94.802 kg)  08/20/11 211 lb 6.4 oz (95.89 kg)  12/30/10 248 lb (112.492 kg)  11/25/10 250 lb (113.399 kg)  01/11/10 231 lb 2.1 oz (104.84 kg)     Usual Body Weight: 199 lb  % Usual Body Weight: 115%  BMI:  Body mass index is 42.04 kg/(m^2). Obese Class III (not accurate 2/2 fluid overload)  Estimated Nutritional Needs: Kcal: 1700 - 2000 Protein: 75 - 90 g Fluid: 1.8 - 2 liters daily  Skin: L groin incision  Diet Order: Carb Control Medium  EDUCATION NEEDS: -No education needs identified at this time   Intake/Output Summary (Last 24 hours) at 11/18/12 1047 Last data filed at 11/18/12 0954  Gross per 24 hour  Intake    360 ml  Output  3500 ml  Net  -3140 ml    Last BM: 8/11  Labs:   Recent Labs Lab 11/16/12 0514 11/17/12 2236 11/18/12 0007 11/18/12 0750  NA 139 141 141 142  K 3.9 4.2 3.8 3.3*  CL 104 103 101 101  CO2 28 26  --  30  BUN 8 5* <3* 5*  CREATININE 0.56 0.59 0.70 0.66  CALCIUM 8.6 9.3  --  9.3  GLUCOSE 136* 95 89 111*    CBG (last 3)   Recent Labs  11/16/12 2058 11/17/12 0718 11/18/12 0627  GLUCAP 86 72 75    Scheduled Meds: . [START ON 11/19/2012] enoxaparin (LOVENOX) injection  50 mg Subcutaneous Q24H  . insulin aspart  0-15  Units Subcutaneous TID WC  . insulin aspart  0-5 Units Subcutaneous QHS  . insulin glargine  20 Units Subcutaneous Daily  . levofloxacin (LEVAQUIN) IV  750 mg Intravenous Q24H  . pantoprazole  40 mg Oral Daily  . sodium chloride  3 mL Intravenous Q12H  . sodium chloride  3 mL Intravenous Q12H  . venlafaxine XR  150 mg Oral Q breakfast    Continuous Infusions:   Past Medical History  Diagnosis Date  . Diabetes mellitus   . Back pain   . Osteopenia   . Allergy   . Asthma   . Cancer   . Breast cancer   . Depression   . Anxiety   . Osteoporosis     osteopenia    Past Surgical History  Procedure Laterality Date  . Mastectomy    . Abdominal hysterectomy    . Lymphadenectomy    . Cesarean section    . Breast surgery    . Incision and drainage abscess Left 11/14/2012    Procedure: INCISION AND DRAINAGE ABSCESS LEFT GROIN;  Surgeon: Dalia Heading, MD;  Location: [facility];  Service: General;  Laterality: Left;    Jarold Motto MS, RD, LDN Pager: [phone] After-hours pager: [phone]

## 2012-11-18 NOTE — Care Management Note (Unsigned)
    Page 1 of 2   11/18/2012     1:47:08 PM   CARE MANAGEMENT NOTE 11/18/2012  Patient:  AVERLY, ERICSON   Account Number:  [id number]  Date Initiated:  11/18/2012  Documentation initiated by:  Tera Mater  Subjective/Objective Assessment:   42 y.o. year old female presenting with shortness of breath, pain in recent left groin wound.  Pt. lives at home with her children in [place]     Action/Plan:   discharge planning   Anticipated DC Date:  11/21/2012   Anticipated DC Plan:  HOME W HOME HEALTH SERVICES      DC Planning Services  CM consult      [facility] Choice  DURABLE MEDICAL EQUIPMENT  HOME HEALTH   Choice offered to / List presented to:     DME arranged  [facility]      DME agency  [employer]        Status of service:  In process, will continue to follow Medicare Important Message given?   (If response is "NO", the following Medicare IM given date fields will be blank) Date Medicare IM given:   Date Additional Medicare IM given:    Discharge Disposition:    Per UR Regulation:  Reviewed for med. necessity/level of care/duration of stay  If discussed at Long Length of Stay Meetings, dates discussed:    Comments:  11/18/12 1100 Noted WOC notes and CM referral for wound vac. [employer] forms placed on shadow chart for MD to complete.  Pt. will need home health as well to manage wound vac.  Will fax [employer] forms once completed. NCM to follow. Tera Mater, RN, BSN NCM [phone]

## 2012-11-18 NOTE — H&P (Signed)
Family Medicine Teaching [facility] Admission History and Physical Service Pager: [phone]  Patient name: Ann Lopez Medical record number: [id number] Date of birth: 12-Jun-1970 Age: 42 y.o. Gender: female  Primary Care Provider: Elvina Sidle, MD Consultants: none to date Code Status: Full  Chief Complaint: shortness of breath, pain in I&D wound, body/limb swelling  Assessment and Plan: Ann Lopez is a 42 y.o. year old female presenting with shortness of breath, pain in recent left groin wound (I&D at [facility] by Dr. Lovell Sheehan of surgery), and anasarca. No prior history of heart failure. PMH significant for DM type II, depression, hx of gastric bypass surgery, hx of breast cancer s/p bilateral mastectomy; discharged 8/11 from [facility] for groin abscesses and CAP with sepsis.  #Anasarca - believe due to over-aggressive fluid resuscitation during prior admission -no history of heart failure; question component of poor nutrition (pt had recent albumins in the 2's, poor intake, etc) -will start gentle diuresis with Lasix 40 mg x1, then assess output and consider further IV/PO diuretics [ ]  strict I&O, daily weights [ ]  consider Lasix daily or BID, dose to be determined [ ]  f/u echocardiogram to evaluate for any new/undiagnosed heart failure  #Recent PNA - CXR here with trace effusions and congestion, but no frank infiltrate -likely covered adequately with recent abx, pt instructed at discharge to continue Levaquin 10 more days -ordered for Levaquin IV; if pt has worsening SOB despite diuresis and/or spikes fevers, will consider broadening  #Recent groin wound - I&D in the OR at [facility] by Dr. Lovell Sheehan on 8/8, on Levaquin as above -pt set up at prior discharge for home health PT for ?hydrotherapy -wound currently packed without frank drainage -consulted WOC nurse, though will likely need to consult surgery here at least for local care rec's [ ]  follow up  consult(s)  #Dyspnea on exertion / generalized weakness / etc - likely multifactorial due to above -will consult PT/OT for further recs [ ]  f/u consults  #Bradycardia - uncertain cause; pt not on any medications that would obviously decrease rate -not obviously symptomatic, but possibly contributing to the above [ ]  repeat EKG in the AM  #DM - last A1c 9.2 last week, previously 9.5 in April -on Lantus 20 and metformin at home -hold metformin, ordered for home Lantus and moderate SSI qAC-HS [ ]  monitor CBG's  #Depression/anxiety - on Pristiq and Klonopin (during the day) and Xanax (qHS), at home -ordered for Effexor (formulary sub for Pristiq) and Klonopin while here  #FEN/GI: saline lock IV, carb-modified diet #Prophylaxis: Lovenox per pharmacy consult  Disposition: Admit to inpatient, attending Dr. Leveda Anna (day attending Dr. McDiarmid), with management as above  -anticipate discharge home, possibly with home health PT, based on clinical improvement in SOB/DOE/edema  History of Present Illness: Ann Lopez is a 42 y.o. year old female presenting with shortness of breath, pain in recent left groin wound (I&D at [facility] by Dr. Lovell Sheehan of surgery), and diffuse body/limb swelling. No prior history of heart failure; pt has seen a cardiologist in the past but is unsure who. PMH significant for DM type II, depression, hx of gastric bypass surgery, hx of breast cancer s/p bilateral mastectomy; discharged 8/11 from [facility] for groin abscesses and CAP with sepsis. Pt was treated there with vanc and Levaquin IV, then discharged with Levaquin PO and took one Levaquin between discharge and presenting to the ED here. Pt states she was treated with "tons of fluid" and "only urinated  twice" and states that the swelling in her legs has gotten worse over the past several days starting with when she was getting fluids IV at [facility]. She has also felt short of breath and has had severe pain from her  groin wound that was not well controlled prior to her discharge with Norco, and she was given an Rx for Norco only enough for 1 tablet q8. Pt came to the ED mostly due to pain not controlled with Norco, but also feels "very weak" all over, easily winded, and "like something just isn't right." Pt states she was 199 lbs at discharge from [facility] and was 235 on the scale for her admission weight here. She was found in the ED to have bradycardia around 40's-50's.  She generally denies fevers, abdominal pain, current nausea/vomiting, chest pain, leg pain with her leg swelling, or headache; pt states she has some of these symptoms (fever, vomiting, abdominal discomfort) which resolved with antibiotics for her PNA/abscesses.  Review Of Systems: Per HPI. Otherwise 12 point review of systems was performed and was unremarkable.  Patient Active Problem List   Diagnosis Date Noted  . Anasarca 11/18/2012  . Sepsis 11/08/2012  . Community acquired pneumonia 11/08/2012  . Dehydration 11/08/2012  . Abscess of groin, left 11/08/2012  . Dyslipidemia 09/10/2011  . BRCA1 positive 09/10/2011  . Breast cancer 09/10/2011  . H/O gastric bypass; 05/14/11([facility]) 09/10/2011  . HYPERTENSION 10/28/2008  . SHORTNESS OF BREATH (SOB) 10/28/2008  . OBESITY 02/19/2007  . DEPRESSION/ANXIETY 02/19/2007  . CARPAL TUNNEL SYNDROME, RIGHT 02/19/2007  . ALLERGIC RHINITIS 02/19/2007  . GERD 02/19/2007  . IRRITABLE BOWEL SYNDROME 02/19/2007  . HERPES GENITALIS 12/30/2006  . DM 12/30/2006  . Other and Unspecified Hyperlipidemia 12/30/2006  . MICROCYTIC ANEMIA 12/30/2006  . DEPRESSION 12/30/2006  . OSTEOPENIA 12/30/2006  . ANOMALY, CONGENITAL, NERVOUS SYSTEM NEC 12/30/2006   Past Medical History: Past Medical History  Diagnosis Date  . Diabetes mellitus   . Back pain   . Osteopenia   . Allergy   . Asthma   . Cancer   . Breast cancer   . Depression   . Anxiety   . Osteoporosis     osteopenia   Past Surgical  History: Past Surgical History  Procedure Laterality Date  . Mastectomy    . Abdominal hysterectomy    . Lymphadenectomy    . Cesarean section    . Breast surgery    . Incision and drainage abscess Left 11/14/2012    Procedure: INCISION AND DRAINAGE ABSCESS LEFT GROIN;  Surgeon: Dalia Heading, MD;  Location: [facility];  Service: General;  Laterality: Left;   Social History: History  Substance Use Topics  . Smoking status: Former Smoker    Types: Cigarettes    Quit date: 04/21/2007  . Smokeless tobacco: Not on file  . Alcohol Use: No   Additional social history: Please also refer to relevant sections of EMR.  Family History: Family History  Problem Relation Age of Onset  . Cancer Mother 30    breast cancer  . Hypertension Sister   . Hypothyroidism Brother   . Cancer Maternal Grandmother 57    breast cancer  . Cancer Maternal Grandfather 55    pancreatic cancer   Allergies and Medications: Allergies  Allergen Reactions  . Azithromycin     Unknown  . Morphine And Related     Can tolerate hydrocodone and dilaudid without side effects  . Nitrofurantoin     Unknown  .  Penicillins Hives and Itching  . Sulfonamide Derivatives Hives   No current facility-administered medications on file prior to encounter.   Current Outpatient Prescriptions on File Prior to Encounter  Medication Sig Dispense Refill  . albuterol (PROVENTIL HFA;VENTOLIN HFA) 108 (90 BASE) MCG/ACT inhaler Inhale 1 puff into the lungs every 6 (six) hours as needed for wheezing.  1 Inhaler  10  . ALPRAZolam (XANAX) 1 MG tablet TAKE 1 TABLET (1MG  TOTAL) BY MOUTH AT BEDTIME AS NEEDED FOR SLEEP  90 tablet  0  . clonazePAM (KLONOPIN) 1 MG tablet Take 1 tablet (1 mg total) by mouth 2 (two) times daily as needed for anxiety.  180 tablet  1  . desvenlafaxine (PRISTIQ) 100 MG 24 hr tablet Take 1 tablet (100 mg total) by mouth daily.  90 tablet  3  . fluticasone (FLONASE) 50 MCG/ACT nasal spray Place 2 sprays into the nose  daily.  1 g  12  . HYDROcodone-acetaminophen (NORCO) 7.5-325 MG per tablet Take 1 tablet by mouth every 8 (eight) hours as needed for pain.  90 tablet  3  . insulin glargine (LANTUS) 100 UNIT/ML injection Inject 0.2 mLs (20 Units total) into the skin daily.  10 mL  11  . levofloxacin (LEVAQUIN) 750 MG tablet Take 1 tablet (750 mg total) by mouth daily.  10 tablet  0  . metFORMIN (GLUCOPHAGE) 500 MG tablet Take 2 tablets (1,000 mg total) by mouth 2 (two) times daily with a meal.  240 tablet  3  . omeprazole (PRILOSEC) 20 MG capsule Take 1 capsule (20 mg total) by mouth daily.  30 capsule  1  . promethazine (PHENERGAN) 25 MG tablet Take 1 tablet (25 mg total) by mouth every 6 (six) hours as needed for nausea.  30 tablet  0  . Zoledronic Acid (ZOMETA IV) Inject into the vein. Every 6 months        Objective: BP 119/59  Pulse 56  Temp(Src) 97.9 F (36.6 C) (Oral)  Resp 18  Ht 5\' 2"  (1.575 m)  Wt 235 lb 3.7 oz (106.7 kg)  BMI 43.01 kg/m2  SpO2 96% Exam: General: adult female in mild/moderate distress at times, occasionally tearful, but speaking in full sentences on room air  Able to move/sit up without assistance, though somewhat slowed due to discomfort HEENT: [place]/AT, PERRLA, EOMI, MMM, TM's not well-visualized Cardiovascular: bradycardic but clinically regular, no murmur appreciated; sounds slightly distant due to habitus Respiratory: generally CTAB, very faint crackles at bases Abdomen: obese, soft, generally nontender Extremities: frank 3+ pitting edema from feet/ankles up to above knees bilaterally  Similar edema to upper extremities and hands  Distal pulses difficult to palpate secondary to swelling  Extremities not frankly tender or red/warm to the touch MSK: diffuse lumbar bony tenderness, no frank muscle spasm, no CVA tenderness Skin: warm, dry; left lateral buttock diffusely red without distinct border or induration; no fluctuance  Left groin wound with large dressing and packing  in place, edges of wound erythematous  No frank wound drainage or bleeding Neuro: grossly intact, strength 5/5 and symmetric bilaterally  Alert/oriented  Labs and Imaging: CBC BMET   Recent Labs Lab 11/17/12 2359 11/18/12 0007  WBC 12.4*  --   HGB 11.8* 11.2*  HCT 35.0* 33.0*  PLT 430*  --     Recent Labs Lab 11/17/12 2236 11/18/12 0007  NA 141 141  K 4.2 3.8  CL 103 101  CO2 26  --   BUN 5* <3*  CREATININE  0.59 0.70  GLUCOSE 95 89  CALCIUM 9.3  --      Pro-BNP 535.3 (no previous) POC troponin negative  CXR 8/11 @2142 : trace pleural effusion, central congestion without frank opacity/infiltrate EKG: sinus bradycardia, rate 51, otherwise unremarkable  Bobbye Morton, MD 11/18/2012, 5:14 AM PGY-2, [facility] FPTS Intern pager: [phone], text pages welcome

## 2012-11-18 NOTE — Progress Notes (Addendum)
MEDICATION RELATED CONSULT NOTE - INITIAL   Pharmacy Consult for lovenox Indication: VTE px  Allergies  Allergen Reactions  . Azithromycin     Unknown  . Morphine And Related     Can tolerate hydrocodone and dilaudid without side effects  . Nitrofurantoin     Unknown  . Penicillins Hives and Itching  . Sulfonamide Derivatives Hives    Patient Measurements: Height: 5\' 2"  (157.5 cm) Weight: 235 lb 3.7 oz (106.7 kg) (rn was notified and re weighed patient.) IBW/kg (Calculated) : 50.1 Body Weight: 106.7 kg  Vital Signs: Temp: 97.9 F (36.6 C) (08/12 0120) Temp src: Oral (08/12 0120) BP: 119/59 mmHg (08/12 0120) Pulse Rate: 56 (08/12 0120) Intake/Output from previous day:   Intake/Output from this shift:    Labs:  Recent Labs  11/15/12 0448 11/16/12 0514 11/17/12 2236 11/17/12 2359 11/18/12 0007  WBC 12.7* 13.6* 12.6* 12.4*  --   HGB 9.5* 10.3* 11.7* 11.8* 11.2*  HCT 29.3* 32.2* 34.4* 35.0* 33.0*  PLT 382 458* 455* 430*  --   CREATININE 0.43* 0.56 0.59  --  0.70  ALBUMIN 2.2* 2.2*  --   --   --   PROT 6.3 6.1  --   --   --   AST 24 33  --   --   --   ALT 21 24  --   --   --   ALKPHOS 172* 180*  --   --   --   BILITOT 0.7 0.6  --   --   --    Estimated Creatinine Clearance: 107.3 ml/min (by C-G formula based on Cr of 0.7).   Microbiology: Recent Results (from the past 720 hour(s))  CULTURE, BLOOD (ROUTINE X 2)     Status: None   Collection Time    11/08/12 11:40 AM      Result Value Range Status   Specimen Description BLOOD LEFT ANTECUBITAL   Final   Special Requests BOTTLES DRAWN AEROBIC ONLY 8CC   Final   Culture NO GROWTH 5 DAYS   Final   Report Status 11/13/2012 FINAL   Final  CULTURE, ROUTINE-ABSCESS     Status: None   Collection Time    11/08/12  1:15 PM      Result Value Range Status   Specimen Description ABSCESS LEFT GROIN   Final   Special Requests NONE   Final   Gram Stain     Final   Value: FEW WBC PRESENT, PREDOMINANTLY PMN     NO  SQUAMOUS EPITHELIAL CELLS SEEN     FEW GRAM POSITIVE COCCI IN PAIRS     Performed at [facility]   Culture     Final   Value: MULTIPLE ORGANISMS PRESENT, NONE PREDOMINANT     Note: NO STAPHYLOCOCCUS AUREUS ISOLATED NO GROUP A STREP (S.PYOGENES) ISOLATED     Performed at [facility]   Report Status 11/11/2012 FINAL   Final  CULTURE, BLOOD (ROUTINE X 2)     Status: None   Collection Time    11/08/12  1:25 PM      Result Value Range Status   Specimen Description BLOOD LEFT ANTECUBITAL   Final   Special Requests BOTTLES DRAWN AEROBIC AND ANAEROBIC 8CC   Final   Culture NO GROWTH 5 DAYS   Final   Report Status 11/13/2012 FINAL   Final  SURGICAL PCR SCREEN     Status: None   Collection Time    11/13/12  4:25 PM      Result Value Range Status   MRSA, PCR NEGATIVE  NEGATIVE Final   Staphylococcus aureus NEGATIVE  NEGATIVE Final   Comment:            The Xpert SA Assay (FDA     approved for NASAL specimens     in patients over 32 years of age),     is one component of     a comprehensive surveillance     program.  Test performance has     been validated by [employer] for patients greater     than or equal to 62 year old.     It is not intended     to diagnose infection nor to     guide or monitor treatment.  CULTURE, ROUTINE-ABSCESS     Status: None   Collection Time    11/14/12 12:02 PM      Result Value Range Status   Specimen Description ABSCESS LEFT GROIN   Final   Special Requests VANCOMYCIN 1250mg    Final   Gram Stain     Final   Value: MODERATE WBC PRESENT, PREDOMINANTLY PMN     NO SQUAMOUS EPITHELIAL CELLS SEEN     NO ORGANISMS SEEN     Performed at [facility]   Culture     Final   Value: NO GROWTH 3 DAYS     Performed at [facility]   Report Status 11/17/2012 FINAL   Final  ANAEROBIC CULTURE     Status: None   Collection Time    11/14/12 12:02 PM      Result Value Range Status   Specimen Description ABSCESS LEFT GROIN    Final   Special Requests VANCOMYCIN 1250mg    Final   Gram Stain     Final   Value: MODERATE WBC PRESENT, PREDOMINANTLY PMN     NO SQUAMOUS EPITHELIAL CELLS SEEN     NO ORGANISMS SEEN     Performed at [facility]   Culture     Final   Value: NO ANAEROBES ISOLATED; CULTURE IN PROGRESS FOR 5 DAYS     Performed at [facility]   Report Status PENDING   Incomplete    Medical History: Past Medical History  Diagnosis Date  . Diabetes mellitus   . Back pain   . Osteopenia   . Allergy   . Asthma   . Cancer   . Breast cancer   . Depression   . Anxiety   . Osteoporosis     osteopenia    Medications:  Prescriptions prior to admission  Medication Sig Dispense Refill  . albuterol (PROVENTIL HFA;VENTOLIN HFA) 108 (90 BASE) MCG/ACT inhaler Inhale 1 puff into the lungs every 6 (six) hours as needed for wheezing.  1 Inhaler  10  . ALPRAZolam (XANAX) 1 MG tablet TAKE 1 TABLET (1MG  TOTAL) BY MOUTH AT BEDTIME AS NEEDED FOR SLEEP  90 tablet  0  . clonazePAM (KLONOPIN) 1 MG tablet Take 1 tablet (1 mg total) by mouth 2 (two) times daily as needed for anxiety.  180 tablet  1  . desvenlafaxine (PRISTIQ) 100 MG 24 hr tablet Take 1 tablet (100 mg total) by mouth daily.  90 tablet  3  . fluticasone (FLONASE) 50 MCG/ACT nasal spray Place 2 sprays into the nose daily.  1 g  12  . HYDROcodone-acetaminophen (NORCO) 7.5-325 MG per tablet Take 1 tablet by  mouth every 8 (eight) hours as needed for pain.  90 tablet  3  . insulin glargine (LANTUS) 100 UNIT/ML injection Inject 0.2 mLs (20 Units total) into the skin daily.  10 mL  11  . levofloxacin (LEVAQUIN) 750 MG tablet Take 1 tablet (750 mg total) by mouth daily.  10 tablet  0  . metFORMIN (GLUCOPHAGE) 500 MG tablet Take 2 tablets (1,000 mg total) by mouth 2 (two) times daily with a meal.  240 tablet  3  . omeprazole (PRILOSEC) 20 MG capsule Take 1 capsule (20 mg total) by mouth daily.  30 capsule  1  . promethazine (PHENERGAN) 25 MG tablet  Take 1 tablet (25 mg total) by mouth every 6 (six) hours as needed for nausea.  30 tablet  0  . Zoledronic Acid (ZOMETA IV) Inject into the vein. Every 6 months        Assessment: 42 yo lady to continue lovenox for VTE px.  Her CrCl >100 and weight 106.7 kg.  She received lovenox 40 mg sq yesterday at [facility].  Goal of Therapy:  Prevention of thromboembolism  Plan:  Lovenox 50 mg sq q24 hours. Check CBC q 3 days while on lovenox Pharmacy to sign off  Seay, Lora Poteet 11/18/2012,2:34 AM

## 2012-11-19 LAB — URINALYSIS, ROUTINE W REFLEX MICROSCOPIC
Bilirubin Urine: NEGATIVE
Glucose, UA: NEGATIVE mg/dL
Hgb urine dipstick: NEGATIVE
Ketones, ur: NEGATIVE mg/dL
Protein, ur: NEGATIVE mg/dL

## 2012-11-19 LAB — ANAEROBIC CULTURE

## 2012-11-19 LAB — GLUCOSE, CAPILLARY: Glucose-Capillary: 123 mg/dL — ABNORMAL HIGH (ref 70–99)

## 2012-11-19 LAB — CBC
HCT: 33.3 % — ABNORMAL LOW (ref 36.0–46.0)
MCHC: 33.3 g/dL (ref 30.0–36.0)
MCV: 83.9 fL (ref 78.0–100.0)
RDW: 15.1 % (ref 11.5–15.5)

## 2012-11-19 LAB — BASIC METABOLIC PANEL
BUN: 9 mg/dL (ref 6–23)
Creatinine, Ser: 0.74 mg/dL (ref 0.50–1.10)
GFR calc Af Amer: >90 mL/min (ref >90)
GFR calc non Af Amer: >90 mL/min (ref >90)

## 2012-11-19 NOTE — Progress Notes (Signed)
Pt's wound Vac leaking -obtained new dressing materials. Cleansed lightly and repacked wound with Black Foam. New wound vac placed to site and back to canister continuous suction at 125 pressure as ordered.

## 2012-11-19 NOTE — Discharge Summary (Signed)
Family Medicine Teaching [facility] Discharge Summary  Patient name: Ann Lopez Medical record number: [id number] Date of birth: 1971-02-11 Age: 42 y.o. Gender: female Date of Admission: 11/17/2012  Date of Discharge: 11/20/12 Admitting Physician: Sanjuana Letters, MD  Primary Care Provider: Elvina Sidle, MD Consultants: Wound care nurse   Indication for Hospitalization: Groin abscess, Volume overload   Discharge Diagnoses/Problem List:  Anasarca  Abscess of groin, left  Bradycardia  Depression  Anxiety  T2DM  Disposition: Home health   Discharge Condition: stable   Brief Hospital Course:  Ann Lopez is a 42 y.o. year old female presenting with shortness of breath, pain in recent left groin wound (I&D at [facility] by Dr. Lovell Sheehan of surgery), and anasarca. No prior history of heart failure. PMH significant for DM type II, depression, hx of gastric bypass surgery, hx of breast cancer s/p bilateral mastectomy; discharged 8/11 from [facility] for groin abscesses and CAP with sepsis.   #Anasarca - believe due to over-aggressive fluid resuscitation during prior admission  -no history of heart failure; question component of poor nutrition (pt had recent albumins in the 2's, poor intake, etc)  -Lasix was started for diuresis  - Weight decreased from 235 lbs to 214 lbs on discharge, her baseline is 199 lbs  - ECHO LV EF: 55% - 60%  - nutrition added Prostat, Glucerna, MVI, also recommended Calcium citrate with Vitamin D due to hx of gastric bypass   #Recent PNA - CXR here with trace effusions and congestion, but no frank infiltrate  -finished a 10 day course of Levaquin    #Recent groin wound - I&D in the OR at [facility] by Dr. Lovell Sheehan on 8/8, on Levaquin as above  - she was sent home on wound vac with home health to change 3 days a week  - she has follow up with the surgeon that performed I&D   #Dyspnea on exertion / generalized weakness / etc - likely  multifactorial due to above  - no PT f/u; supervision-intermittent  - OT signed off due to Pt reporting that she does not foresee any issues with BADLs and if she does have trouble one of her kids or her sister can help her.   #Bradycardia - uncertain cause; pt not on any medications that would obviously decrease rate  -not obviously symptomatic, but possibly contributing to the above   #DM - last A1c 9.2 last week, previously 9.5 in April  -on Lantus 20 and metformin at home   #Depression/anxiety - on Pristiq and Klonopin (during the day) and Xanax (qHS), at home   Issues for Follow Up:  1. Anasarca: presented with malnutrition, Lasix was initiated, 214 lbs at discharge, 199 lbs is baseline  2. Left groin abscess: ensure that she is receiving home health to change the wound vac 3 times a week. Proper healing  3. Bradycardia: Echo revealed normal structure and LV EF 55-60%, HR 40's-50's  4. T2DM: needs better control. Hgb A1C 9.2 last week   Significant Procedures: none   Significant Labs and Imaging:   Recent Labs Lab 11/17/12 2359 11/18/12 0007 11/18/12 0750 11/19/12 0430  WBC 12.4*  --  10.6* 9.0  HGB 11.8* 11.2* 11.3* 11.1*  HCT 35.0* 33.0* 33.8* 33.3*  PLT 430*  --  415* 377    Recent Labs Lab 11/15/12 0448 11/16/12 0514 11/17/12 2236 11/18/12 0007 11/18/12 0750 11/19/12 0430  NA 138 139 141 141 142 142  K 4.8 3.9 4.2 3.8  3.3* 3.8  CL 102 104 103 101 101 103  CO2 27 28 26   --  30 29  GLUCOSE 229* 136* 95 89 111* 107*  BUN 6 8 5* <3* 5* 9  CREATININE 0.43* 0.56 0.59 0.70 0.66 0.74  CALCIUM 8.9 8.6 9.3  --  9.3 9.5  ALKPHOS 172* 180*  --   --  124*  --   AST 24 33  --   --  18  --   ALT 21 24  --   --  20  --   ALBUMIN 2.2* 2.2*  --   --  2.4*  --     Recent Labs Lab 11/19/12 1110 11/19/12 1604 11/19/12 2124 11/20/12 0600 11/20/12 1124  GLUCAP 130* 123* 114* 112* 112*    CXR 8/11 @2142 : trace pleural effusion, central congestion without frank  opacity/infiltrate EKG: sinus bradycardia, rate 51, otherwise unremarkable  ECHO: LV EF: 55% - 60%   Outstanding Results: none  Discharge Medications:    Medication List    STOP taking these medications       HYDROcodone-acetaminophen 7.5-325 MG per tablet  Commonly known as:  NORCO  Replaced by:  HYDROcodone-acetaminophen 5-325 MG per tablet     levofloxacin 750 MG tablet  Commonly known as:  LEVAQUIN      TAKE these medications       albuterol 108 (90 BASE) MCG/ACT inhaler  Commonly known as:  PROVENTIL HFA;VENTOLIN HFA  Inhale 1 puff into the lungs every 6 (six) hours as needed for wheezing.     ALPRAZolam 1 MG tablet  Commonly known as:  XANAX  TAKE 1 TABLET (1MG  TOTAL) BY MOUTH AT BEDTIME AS NEEDED FOR SLEEP     clonazePAM 1 MG tablet  Commonly known as:  KLONOPIN  Take 1 tablet (1 mg total) by mouth 2 (two) times daily as needed for anxiety.     desvenlafaxine 100 MG 24 hr tablet  Commonly known as:  PRISTIQ  Take 1 tablet (100 mg total) by mouth daily.     fluticasone 50 MCG/ACT nasal spray  Commonly known as:  FLONASE  Place 2 sprays into the nose daily.     HYDROcodone-acetaminophen 5-325 MG per tablet  Commonly known as:  NORCO/VICODIN  Take 2 tablets by mouth every 4 (four) hours as needed.     insulin glargine 100 UNIT/ML injection  Commonly known as:  LANTUS  Inject 0.2 mLs (20 Units total) into the skin daily.     metFORMIN 500 MG tablet  Commonly known as:  GLUCOPHAGE  Take 2 tablets (1,000 mg total) by mouth 2 (two) times daily with a meal.     omeprazole 20 MG capsule  Commonly known as:  PRILOSEC  Take 1 capsule (20 mg total) by mouth daily.     promethazine 25 MG tablet  Commonly known as:  PHENERGAN  Take 1 tablet (25 mg total) by mouth every 6 (six) hours as needed for nausea.     ZOMETA IV  Inject into the vein. Every 6 months        Discharge Instructions: Please refer to Patient Instructions section of EMR for full details.   Patient was counseled important signs and symptoms that should prompt return to medical care, changes in medications, dietary instructions, activity restrictions, and follow up appointments.   Follow-Up Appointments: Follow-up Information   Follow up with Dalia Heading, MD On 11/27/2012. (10:30 AM)    Specialty:  General Surgery   Contact  information:   [address] [place] [address] [phone]       Schedule an appointment as soon as possible for a visit with Primary Care office.   Contact information:   [address]   [place], [place] [address] [phone]      Follow up with [facility]. (home health nurse)    Contact information:   [phone]      Clare Gandy, MD 11/20/2012, 10:15 PM PGY-1, [facility]

## 2012-11-19 NOTE — Progress Notes (Signed)
Family Medicine Teaching Service Daily Progress Note Intern Pager: [phone]  Patient name: Ann Lopez Medical record number: [id number] Date of birth: 07/17/1970 Age: 42 y.o. Gender: female  Primary Care Provider: Elvina Sidle, MD Consultants: none  Code Status: Full   Pt Overview and Major Events to Date:  8/11 - 199 lbs 8/12 - 235-229 lbs  8/13 - dec 226 lbs   Assessment and Plan: Ann Lopez is a 42 y.o. year old female presenting with shortness of breath, pain in recent left groin wound (I&D at [facility] by Dr. Lovell Sheehan of surgery), and anasarca. No prior history of heart failure. PMH significant for DM type II, depression, hx of gastric bypass surgery, hx of breast cancer s/p bilateral mastectomy; discharged 8/11 from [facility] for groin abscesses and CAP with sepsis.   #Anasarca - believe due to over-aggressive fluid resuscitation during prior admission  -no history of heart failure; question component of poor nutrition (pt had recent albumins in the 2's, poor intake, etc)  -will start gentle diuresis with Lasix 40 mg x1, then assess output and consider further IV/PO diuretics  - ECHO LV EF: 55% - 60% - nutrition added Prostat, Glucerna, MVI, also recommended Calcium citrate with Vitamin D due to hx of gastric bypass [ ]  consider Lasix daily or BID, dose to be determined     #Recent PNA - CXR here with trace effusions and congestion, but no frank infiltrate  -likely covered adequately with recent abx, pt instructed at discharge to continue Levaquin 10 more days  -ordered for Levaquin IV; if pt has worsening SOB despite diuresis and/or spikes fevers, will consider broadening   #Recent groin wound - I&D in the OR at [facility] by Dr. Lovell Sheehan on 8/8, on Levaquin as above  -pt set up at prior discharge for home health PT for ?hydrotherapy  -wound currently packed without frank drainage  -consulted WOC nurse: NPWT VAC  [ ]  follow up consult(s)   #Dyspnea on exertion /  generalized weakness / etc - likely multifactorial due to above  - no PT f/u; supervision-intermittent  -will consult PT/OT for further recs  [ ]  f/u consults   #Bradycardia - uncertain cause; pt not on any medications that would obviously decrease rate  -not obviously symptomatic, but possibly contributing to the above  [ ]  repeat EKG in the AM   #DM - last A1c 9.2 last week, previously 9.5 in April  -on Lantus 20 and metformin at home  -hold metformin, ordered for home Lantus and moderate SSI qAC-HS; has not received Novolog   [ ]  monitor CBG's   #Depression/anxiety - on Pristiq and Klonopin (during the day) and Xanax (qHS), at home  -ordered for Effexor (formulary sub for Pristiq) and Klonopin while here   #FEN/GI: saline lock IV, carb-modified diet  #Prophylaxis: Lovenox per pharmacy consult  Disposition: pending improvement   Subjective: Patient had no overnight events. She is feeling better. She denies cough, SOB.   Objective: Temp:  [97.5 F (36.4 C)-97.9 F (36.6 C)] 97.8 F (36.6 C) (08/13 0832) Pulse Rate:  [44-59] 44 (08/13 0832) Resp:  [18] 18 (08/13 0832) BP: (100-151)/(42-83) 146/83 mmHg (08/13 0832) SpO2:  [95 %-96 %] 96 % (08/13 0832) Weight:  [226 lb 10.1 oz (102.8 kg)] 226 lb 10.1 oz (102.8 kg) (08/13 0517)  I/O: 720/1825 UOP: 0.7  Physical Exam: General: adult female in NAD,Able to move/sit up without assistance, though somewhat slowed due to discomfort  Cardiovascular: bradycardic but clinically  regular, no murmur appreciated; sounds slightly distant due to habitus  Respiratory: generally CTAB, very faint crackles at bases  Abdomen: obese, soft, generally nontender  Extremities: frank 1+ pitting edema from feet/ankles up to above knees bilaterally   Distal pulses difficult to palpate secondary to swelling  Extremities not frankly tender or red/warm to the touch  MSK: FROM  Skin: Wound Vac in place in right groin.  Neuro: grossly intact,  Alert/oriented  Laboratory:  Recent Labs Lab 11/17/12 2359 11/18/12 0007 11/18/12 0750 11/19/12 0430  WBC 12.4*  --  10.6* 9.0  HGB 11.8* 11.2* 11.3* 11.1*  HCT 35.0* 33.0* 33.8* 33.3*  PLT 430*  --  415* 377    Recent Labs Lab 11/15/12 0448 11/16/12 0514 11/17/12 2236 11/18/12 0007 11/18/12 0750 11/19/12 0430  NA 138 139 141 141 142 142  K 4.8 3.9 4.2 3.8 3.3* 3.8  CL 102 104 103 101 101 103  CO2 27 28 26   --  30 29  BUN 6 8 5* <3* 5* 9  CREATININE 0.43* 0.56 0.59 0.70 0.66 0.74  CALCIUM 8.9 8.6 9.3  --  9.3 9.5  PROT 6.3 6.1  --   --  6.0  --   BILITOT 0.7 0.6  --   --  0.5  --   ALKPHOS 172* 180*  --   --  124*  --   ALT 21 24  --   --  20  --   AST 24 33  --   --  18  --   GLUCOSE 229* 136* 95 89 111* 107*    Recent Labs Lab 11/18/12 0627 11/18/12 1100 11/18/12 1618 11/18/12 2111 11/19/12 0628  GLUCAP 75 88 117* 91 99     Imaging/Diagnostic Tests: CXR 8/11 @2142 : trace pleural effusion, central congestion without frank opacity/infiltrate EKG: sinus bradycardia, rate 51, otherwise unremarkable ECHO: LV EF: 55% - 60%  Clare Gandy, MD 11/19/2012, 9:53 AM PGY-1, [place] [facility] FPTS Intern pager: [phone], text pages welcome

## 2012-11-19 NOTE — Progress Notes (Signed)
OT Cancellation Note  Patient Details Name: Ann Lopez MRN: [id number] DOB: [birth year]   Cancelled Treatment:    Reason Eval/Treat Not Completed: OT screened, no needs identified, will sign off. Pt reports that she does not foresee any issues with BADLs and if she does have trouble one of her kids or her sister can help her. She had already figured out that she needed to wear more of a boxer brief or boxers as underwear and will wear elastic waist pants. No OT needs identified, will sign off.  Evette Georges [phone] 11/19/2012, 10:30 AM

## 2012-11-19 NOTE — Progress Notes (Signed)
Physical Therapy Treatment Patient Details Name: Ann Lopez MRN: [id number] DOB: 1970-12-18 Today's Date: 11/19/2012 Time: 4098-1191 PT Time Calculation (min): 25 min  PT Assessment / Plan / Recommendation  History of Present Illness 42 y.o. year old female presenting with shortness of breath, pain in recent left groin wound (I&D at [facility] by Dr. Lovell Sheehan of surgery), and anasarca. No prior history of heart failure. PMH significant for DM type II, depression, hx of gastric bypass surgery, hx of breast cancer s/p bilateral mastectomy; discharged 8/11 from [facility] for groin abscesses and CAP with sepsis.   PT Comments   Pt moving well.    Follow Up Recommendations  No PT follow up;Supervision - Intermittent     Does the patient have the potential to tolerate intense rehabilitation     Barriers to Discharge        Equipment Recommendations  None recommended by PT    Recommendations for Other Services    Frequency Min 3X/week   Progress towards PT Goals    Plan Current plan remains appropriate    Precautions / Restrictions Precautions Precaution Comments: wound vac to L groin Restrictions Weight Bearing Restrictions: No   Pertinent Vitals/Pain No pain reported.     Mobility  Bed Mobility Bed Mobility: Supine to Sit;Sitting - Scoot to Edge of Bed;Sit to Supine Supine to Sit: 7: Independent Sitting - Scoot to Edge of Bed: 7: Independent Sit to Supine: 7: Independent Transfers Transfers: Sit to Stand;Stand to Sit Sit to Stand: 5: Supervision;With upper extremity assist;From bed Stand to Sit: 6: Modified independent (Device/Increase time);With upper extremity assist;To bed Ambulation/Gait Ambulation/Gait Assistance: 5: Supervision Ambulation Distance (Feet): 260 Feet Assistive device: None Ambulation/Gait Assistance Details: Pt did not require UE support today.  Steady with no LOB noted.   Gait Pattern: Step-through pattern Stairs: Yes Stairs Assistance: 5:  Supervision Stairs Assistance Details (indicate cue type and reason): Supervision for safety.   Stair Management Technique: One rail Right;Alternating pattern;Forwards Number of Stairs: 4 Wheelchair Mobility Wheelchair Mobility: No      PT Goals (current goals can now be found in the care plan section) Acute Rehab PT Goals Patient Stated Goal: home PT Goal Formulation: With patient Time For Goal Achievement: 11/25/12 Potential to Achieve Goals: Good  Visit Information  Last PT Received On: 11/19/12 Assistance Needed: +1 History of Present Illness: 42 y.o. year old female presenting with shortness of breath, pain in recent left groin wound (I&D at [facility] by Dr. Lovell Sheehan of surgery), and anasarca. No prior history of heart failure. PMH significant for DM type II, depression, hx of gastric bypass surgery, hx of breast cancer s/p bilateral mastectomy; discharged 8/11 from [facility] for groin abscesses and CAP with sepsis.    Subjective Data  Patient Stated Goal: home   Cognition  Cognition Arousal/Alertness: Awake/alert Behavior During Therapy: WFL for tasks assessed/performed Overall Cognitive Status: Within Functional Limits for tasks assessed    Balance     End of Session PT - End of Session Activity Tolerance: Patient tolerated treatment well Patient left: in bed;with call bell/phone within reach Nurse Communication: Mobility status   GP     Lara Mulch 11/19/2012, 2:32 PM   Verdell Face, PTA [phone] 11/19/2012

## 2012-11-20 ENCOUNTER — Ambulatory Visit (HOSPITAL_COMMUNITY): Payer: Self-pay | Admitting: Physical Therapy

## 2012-11-20 LAB — GLUCOSE, CAPILLARY
Glucose-Capillary: 112 mg/dL — ABNORMAL HIGH (ref 70–99)
Glucose-Capillary: 112 mg/dL — ABNORMAL HIGH (ref 70–99)

## 2012-11-20 MED ORDER — HYDROCODONE-ACETAMINOPHEN 5-325 MG PO TABS: 2.0000 | ORAL_TABLET | ORAL | Status: DC | PRN

## 2012-11-20 MED ORDER — FUROSEMIDE 40 MG PO TABS
40.0000 mg | ORAL_TABLET | Freq: Every day | ORAL | Status: DC
  Administered 2012-11-20: 40 mg via ORAL
  Filled 2012-11-20: qty 1

## 2012-11-20 NOTE — Progress Notes (Signed)
11/19/12 1500 Spoke Rickie, representative from [employer], to assist with completing forms for wound vac.  Spoke with physician to sign [employer] wound vac forms.  Faxed paperwork to [facility] [phone]) for approval.  Pt. to dc home tomorrow with wound vac and HH RN.   Tera Mater, RN, BSN NCM [phone]

## 2012-11-20 NOTE — Progress Notes (Signed)
11/20/12 1000 In to speak with pt. about [employer] wound vac, and home health RN.  Pt. chose [facility].  TC to Lupita Leash, with [facility], to give referral for [facility] RN.  Spoke with [employer] Ricki [phone]), and pt. should receive the wound vac this afternoon.  Pt. to dc home today with family. Tera Mater, RN, BSN NCM [phone]

## 2012-11-20 NOTE — Progress Notes (Signed)
I discussed with  Dr Schmitz.  I agree with their plans documented in their progress note for today. 

## 2012-11-20 NOTE — Progress Notes (Signed)
[employer] delivered all products needed for wound VAC for home use.  Awaiting discharge completion orders from [facility] medicine team.

## 2012-11-20 NOTE — Progress Notes (Signed)
Pt.ischarged per w/c accompanied by volunteer and pt's son. Has all equipment to go with home Wound Vac. Pt has all instructions on what to do. HH will see pt tomorrow to manage wound vac. If problems pt has [facility] phone #. Pt has all personal belongings including cell phone , personal laptop and all chargers.

## 2012-11-20 NOTE — Progress Notes (Signed)
Went over all discharge instructions with pt aware of home meds, pain pills -sent with prescription & follow up appts. Wound RN called to instruct pt on home wound vac here to see her.

## 2012-11-20 NOTE — Progress Notes (Signed)
Family Medicine Teaching Service Daily Progress Note Intern Pager: [phone]  Patient name: Ann Lopez Medical record number: [id number] Date of birth: [birth year] Age: 42 y.o. Gender: female  Primary Care Provider: Elvina Sidle, MD Consultants: none  Code Status: Full   Pt Overview and Major Events to Date:  8/11 - 199 lbs  8/12 - 235-229 lbs  8/13 - dec 226 lbs  8/14 - dec 214 lbs   Assessment and Plan: Ann Lopez is a 42 y.o. year old female presenting with shortness of breath, pain in recent left groin wound (I&D at [facility] by Dr. Lovell Lopez of surgery), and anasarca. No prior history of heart failure. PMH significant for DM type II, depression, hx of gastric bypass surgery, hx of breast cancer s/p bilateral mastectomy; discharged 8/11 from [facility] for groin abscesses and CAP with sepsis.   #Anasarca - believe due to over-aggressive fluid resuscitation during prior admission  -no history of heart failure; question component of poor nutrition (pt had recent albumins in the 2's, poor intake, etc)  - ? D/c with lasix due to increased output  - ECHO LV EF: 55% - 60%  - nutrition added Prostat, Glucerna, MVI, also recommended Calcium citrate with Vitamin D due to hx of gastric bypass  - 6150 output; UOP: 2.6  #Recent PNA - CXR here with trace effusions and congestion, but no frank infiltrate  -likely covered adequately with recent abx, pt instructed at discharge to continue Levaquin 10 more days  -ordered for Levaquin IV; if pt has worsening SOB despite diuresis and/or spikes fevers, will consider broadening   #Recent groin wound - I&D in the OR at [facility] by Dr. Lovell Lopez on 8/8, on Levaquin as above   -pt set up at prior discharge for home health PT for ?hydrotherapy  -wound currently packed without frank drainage  -consulted WOC nurse: NPWT VAC   #Dyspnea on exertion / generalized weakness / etc - likely multifactorial due to above  - no PT f/u;  supervision-intermittent    #Bradycardia - uncertain cause; pt not on any medications that would obviously decrease rate  -not obviously symptomatic, but possibly contributing to the above   #DM - last A1c 9.2 last week, previously 9.5 in April  -on Lantus 20 and metformin at home  -hold metformin, ordered for home Lantus and moderate SSI qAC-HS; has not received Novolog   #Depression/anxiety - on Pristiq and Klonopin (during the day) and Xanax (qHS), at home  -ordered for Effexor (formulary sub for Pristiq) and Klonopin while here   #FEN/GI: saline lock IV, carb-modified diet  #Prophylaxis: Lovenox per pharmacy consult  Disposition: home pending improvement   Subjective: She had no overnight events. She has been using the bathroom a lot. Has not problems lying down. Has had no shortness of breath or coughing. She is ready to go home. She says the swelling is much better in her legs and her wrists and hands are back to normal.   Objective: Temp:  [97.8 F (36.6 C)-98.1 F (36.7 C)] 97.8 F (36.6 C) (08/14 0557) Pulse Rate:  [46-52] 52 (08/14 0557) Resp:  [18] 18 (08/14 0557) BP: (120-140)/(52-62) 126/52 mmHg (08/14 0557) SpO2:  [93 %-96 %] 93 % (08/14 0557) Weight:  [214 lb 15.2 oz (97.5 kg)] 214 lb 15.2 oz (97.5 kg) (08/14 0557) Physical Exam: General: adult female in NAD,Able to move/sit up without assistance, though somewhat slowed due to discomfort  Cardiovascular: bradycardic but clinically regular, no murmur appreciated; sounds  slightly distant due to habitus  Respiratory: generally CTAB, very faint crackles at bases  Abdomen: obese, soft, generally nontender  Extremities: frank 1+ pitting edema from feet/ankles up to above knees bilaterally  Distal pulses difficult to palpate secondary to swelling  Extremities not frankly tender or red/warm to the touch  MSK: FROM  Skin: Wound Vac in place in right groin.  Neuro: grossly intact, Alert/oriented  Laboratory:  Recent  Labs Lab 11/17/12 2359 11/18/12 0007 11/18/12 0750 11/19/12 0430  WBC 12.4*  --  10.6* 9.0  HGB 11.8* 11.2* 11.3* 11.1*  HCT 35.0* 33.0* 33.8* 33.3*  PLT 430*  --  415* 377    Recent Labs Lab 11/15/12 0448 11/16/12 0514 11/17/12 2236 11/18/12 0007 11/18/12 0750 11/19/12 0430  NA 138 139 141 141 142 142  K 4.8 3.9 4.2 3.8 3.3* 3.8  CL 102 104 103 101 101 103  CO2 27 28 26   --  30 29  BUN 6 8 5* <3* 5* 9  CREATININE 0.43* 0.56 0.59 0.70 0.66 0.74  CALCIUM 8.9 8.6 9.3  --  9.3 9.5  PROT 6.3 6.1  --   --  6.0  --   BILITOT 0.7 0.6  --   --  0.5  --   ALKPHOS 172* 180*  --   --  124*  --   ALT 21 24  --   --  20  --   AST 24 33  --   --  18  --   GLUCOSE 229* 136* 95 89 111* 107*    Recent Labs Lab 11/19/12 0628 11/19/12 1110 11/19/12 1604 11/19/12 2124 11/20/12 0600  GLUCAP 99 130* 123* 114* 112*    Imaging/Diagnostic Tests: CXR 8/11 @2142 : trace pleural effusion, central congestion without frank opacity/infiltrate EKG: sinus bradycardia, rate 51, otherwise unremarkable  ECHO: LV EF: 55% - 60%   Ann Gandy, MD 11/20/2012, 9:26 AM PGY-1, [facility] FPTS Intern pager: [phone], text pages welcome

## 2012-11-20 NOTE — Progress Notes (Signed)
I discussed with  Dr Jordan Likes.  I agree with their plans documented in their progress note for today.

## 2012-11-21 NOTE — Discharge Summary (Signed)
I discussed with  Dr Schmitz.  I agree with their plans documented in their discharge note for today.  

## 2012-12-01 ENCOUNTER — Ambulatory Visit: Payer: Medicare Other

## 2012-12-01 ENCOUNTER — Ambulatory Visit (INDEPENDENT_AMBULATORY_CARE_PROVIDER_SITE_OTHER): Payer: Medicare Other | Admitting: Family Medicine

## 2012-12-01 VITALS — BP 110/74 | HR 86 | Temp 98.2°F | Resp 16 | Ht 61.5 in | Wt 206.0 lb

## 2012-12-01 DIAGNOSIS — L02214 Cutaneous abscess of groin: Secondary | ICD-10-CM

## 2012-12-01 DIAGNOSIS — L02219 Cutaneous abscess of trunk, unspecified: Secondary | ICD-10-CM

## 2012-12-01 DIAGNOSIS — J189 Pneumonia, unspecified organism: Secondary | ICD-10-CM

## 2012-12-01 DIAGNOSIS — E119 Type 2 diabetes mellitus without complications: Secondary | ICD-10-CM

## 2012-12-01 DIAGNOSIS — B356 Tinea cruris: Secondary | ICD-10-CM

## 2012-12-01 LAB — POCT CBC
Granulocyte percent: 56.4 %G (ref 37–80)
HCT, POC: 45.1 % (ref 37.7–47.9)
Hemoglobin: 14.4 g/dL (ref 12.2–16.2)
Lymph, poc: 3.6 — AB (ref 0.6–3.4)
MCH, POC: 28 pg (ref 27–31.2)
MCHC: 31.9 g/dL (ref 31.8–35.4)
MCV: 87.6 fL (ref 80–97)
MID (cbc): 0.8 (ref 0–0.9)
MPV: 9 fL (ref 0–99.8)
POC Granulocyte: 5.7 (ref 2–6.9)
POC LYMPH PERCENT: 35.2 %L (ref 10–50)
POC MID %: 8.4 %M (ref 0–12)
Platelet Count, POC: 274 10*3/uL (ref 142–424)
RBC: 5.15 M/uL (ref 4.04–5.48)
RDW, POC: 15.8 %
WBC: 10.1 10*3/uL (ref 4.6–10.2)

## 2012-12-01 MED ORDER — TRAMADOL HCL 50 MG PO TABS: 50.0000 mg | ORAL_TABLET | Freq: Three times a day (TID) | ORAL | Status: DC | PRN

## 2012-12-01 MED ORDER — DOXYCYCLINE HYCLATE 100 MG PO TABS: 100.0000 mg | ORAL_TABLET | Freq: Two times a day (BID) | ORAL | Status: DC

## 2012-12-01 MED ORDER — KETOCONAZOLE 200 MG PO TABS: 200.0000 mg | ORAL_TABLET | Freq: Every day | ORAL | Status: DC

## 2012-12-01 MED ORDER — KETOCONAZOLE 2 % EX CREA: TOPICAL_CREAM | Freq: Every day | CUTANEOUS | Status: DC

## 2012-12-01 NOTE — Patient Instructions (Addendum)
Recheck 1 week?

## 2012-12-01 NOTE — Progress Notes (Signed)
42 yo woman with recent pneumonia and groin abscess. She had a surgical drainage by Dr. Lovell Sheehan, and then she had wound care.  Today the nurse found the sinus tract to be 3 cm deep.  Objective:  NAD Skin:  Wound evacuator in place suctioning serosanguinous fluid, left groin  1 cm abrasion right lateral hip  Groin erythema with excoriations Chest:  Few ronchi right chest. Alert, appropriate Heart:  Without murmur, regular, no gallop Results for orders placed in visit on 12/01/12  POCT CBC      Result Value Range   WBC 10.1  4.6 - 10.2 K/uL   Lymph, poc 3.6 (*) 0.6 - 3.4   POC LYMPH PERCENT 35.2  10 - 50 %L   MID (cbc) 0.8  0 - 0.9   POC MID % 8.4  0 - 12 %M   POC Granulocyte 5.7  2 - 6.9   Granulocyte percent 56.4  37 - 80 %G   RBC 5.15  4.04 - 5.48 M/uL   Hemoglobin 14.4  12.2 - 16.2 g/dL   HCT, POC 91.4  78.2 - 47.9 %   MCV 87.6  80 - 97 fL   MCH, POC 28.0  27 - 31.2 pg   MCHC 31.9  31.8 - 35.4 g/dL   RDW, POC 95.6     Platelet Count, POC 274  142 - 424 K/uL   MPV 9.0  0 - 99.8 fL   [facility] reading (PRIMARY) by  Dr. Milus Glazier:  CXR:  Right post surgical clips, no infiltrate, borderline cardiomegaly.   Assessment:  Cellulitis right hip, persistent abscess, tinea cruris.

## 2013-04-23 ENCOUNTER — Other Ambulatory Visit: Payer: Self-pay

## 2013-04-23 DIAGNOSIS — F410 Panic disorder [episodic paroxysmal anxiety] without agoraphobia: Secondary | ICD-10-CM

## 2013-04-23 NOTE — Telephone Encounter (Signed)
Pharm reqs RF of clonazepam 1 mg, 1 tab twice daily as needed. She is due for f/up. I have pended 1 mos w/note pt needs OV if you want to give 1 RF, Dr L.

## 2013-04-23 NOTE — Telephone Encounter (Signed)
Pharm also sent req to RF alprazolam. Please review both pending RFs.

## 2013-06-26 ENCOUNTER — Encounter (HOSPITAL_COMMUNITY): Payer: Self-pay | Admitting: Emergency Medicine

## 2013-06-26 ENCOUNTER — Emergency Department (HOSPITAL_COMMUNITY): Payer: Medicare Other

## 2013-06-26 ENCOUNTER — Other Ambulatory Visit: Payer: Self-pay

## 2013-06-26 ENCOUNTER — Emergency Department (HOSPITAL_COMMUNITY)
Admission: EM | Admit: 2013-06-26 | Discharge: 2013-06-26 | Disposition: A | Discharge status: Home / Self Care | Payer: Medicare Other | Attending: Emergency Medicine | Admitting: Emergency Medicine

## 2013-06-26 DIAGNOSIS — Z853 Personal history of malignant neoplasm of breast: Secondary | ICD-10-CM | POA: Insufficient documentation

## 2013-06-26 DIAGNOSIS — J40 Bronchitis, not specified as acute or chronic: Secondary | ICD-10-CM

## 2013-06-26 DIAGNOSIS — Z880 Allergy status to penicillin: Secondary | ICD-10-CM | POA: Insufficient documentation

## 2013-06-26 DIAGNOSIS — F411 Generalized anxiety disorder: Secondary | ICD-10-CM | POA: Insufficient documentation

## 2013-06-26 DIAGNOSIS — E119 Type 2 diabetes mellitus without complications: Secondary | ICD-10-CM | POA: Insufficient documentation

## 2013-06-26 DIAGNOSIS — Z792 Long term (current) use of antibiotics: Secondary | ICD-10-CM | POA: Insufficient documentation

## 2013-06-26 DIAGNOSIS — IMO0002 Reserved for concepts with insufficient information to code with codable children: Secondary | ICD-10-CM | POA: Insufficient documentation

## 2013-06-26 DIAGNOSIS — J45909 Unspecified asthma, uncomplicated: Secondary | ICD-10-CM | POA: Insufficient documentation

## 2013-06-26 DIAGNOSIS — Z794 Long term (current) use of insulin: Secondary | ICD-10-CM | POA: Insufficient documentation

## 2013-06-26 DIAGNOSIS — Z79899 Other long term (current) drug therapy: Secondary | ICD-10-CM | POA: Insufficient documentation

## 2013-06-26 DIAGNOSIS — M810 Age-related osteoporosis without current pathological fracture: Secondary | ICD-10-CM | POA: Insufficient documentation

## 2013-06-26 DIAGNOSIS — I10 Essential (primary) hypertension: Secondary | ICD-10-CM | POA: Insufficient documentation

## 2013-06-26 DIAGNOSIS — Z8659 Personal history of other mental and behavioral disorders: Secondary | ICD-10-CM | POA: Insufficient documentation

## 2013-06-26 DIAGNOSIS — Z87891 Personal history of nicotine dependence: Secondary | ICD-10-CM | POA: Insufficient documentation

## 2013-06-26 LAB — CBC
HEMATOCRIT: 42.8 % (ref 36.0–46.0)
Hemoglobin: 14.7 g/dL (ref 12.0–15.0)
MCH: 28.8 pg (ref 26.0–34.0)
MCHC: 34.3 g/dL (ref 30.0–36.0)
MCV: 83.8 fL (ref 78.0–100.0)
PLATELETS: 216 10*3/uL (ref 150–400)
RBC: 5.11 MIL/uL (ref 3.87–5.11)
RDW: 15.2 % (ref 11.5–15.5)
WBC: 10.8 10*3/uL — AB (ref 4.0–10.5)

## 2013-06-26 LAB — DIFFERENTIAL
BAND NEUTROPHILS: 0 % (ref 0–10)
BASOS PCT: 1 % (ref 0–1)
Basophils Absolute: 0.1 10*3/uL (ref 0.0–0.1)
Blasts: 0 %
EOS PCT: 1 % (ref 0–5)
Eosinophils Absolute: 0.2 10*3/uL (ref 0.0–0.7)
LYMPHS PCT: 32 % (ref 12–46)
Lymphs Abs: 3.7 10*3/uL (ref 0.7–4.0)
MONOS PCT: 7 % (ref 3–12)
Metamyelocytes Relative: 0 %
Monocytes Absolute: 0.7 10*3/uL (ref 0.1–1.0)
Myelocytes: 0 %
Neutro Abs: 6.8 10*3/uL (ref 1.7–7.7)
Neutrophils Relative %: 59 % (ref 43–77)
Promyelocytes Absolute: 0 %
nRBC: 0 /100 WBC

## 2013-06-26 LAB — COMPREHENSIVE METABOLIC PANEL
ALT: 16 U/L (ref 0–35)
AST: 13 U/L (ref 0–37)
Albumin: 3.4 g/dL — ABNORMAL LOW (ref 3.5–5.2)
Alkaline Phosphatase: 80 U/L (ref 39–117)
BILIRUBIN TOTAL: 0.3 mg/dL (ref 0.3–1.2)
BUN: 9 mg/dL (ref 6–23)
CHLORIDE: 94 meq/L — AB (ref 96–112)
CO2: 26 mEq/L (ref 19–32)
CREATININE: 0.5 mg/dL (ref 0.50–1.10)
Calcium: 9.3 mg/dL (ref 8.4–10.5)
GFR calc Af Amer: >90 mL/min (ref >90)
Glucose, Bld: 364 mg/dL — ABNORMAL HIGH (ref 70–99)
Potassium: 3.8 mEq/L (ref 3.7–5.3)
Sodium: 133 mEq/L — ABNORMAL LOW (ref 137–147)
Total Protein: 7.2 g/dL (ref 6.0–8.3)

## 2013-06-26 LAB — TROPONIN I

## 2013-06-26 LAB — PRO B NATRIURETIC PEPTIDE: Pro B Natriuretic peptide (BNP): <5 pg/mL (ref 0–125)

## 2013-06-26 MED ORDER — IPRATROPIUM-ALBUTEROL 0.5-2.5 (3) MG/3ML IN SOLN
3.0000 mL | Freq: Once | RESPIRATORY_TRACT | Status: DC
Start: 2013-06-26 — End: 2013-06-26

## 2013-06-26 MED ORDER — ALBUTEROL SULFATE (2.5 MG/3ML) 0.083% IN NEBU: 2.5000 mg | INHALATION_SOLUTION | Freq: Four times a day (QID) | RESPIRATORY_TRACT | Status: DC | PRN

## 2013-06-26 MED ORDER — PREDNISONE 10 MG PO TABS
60.0000 mg | ORAL_TABLET | Freq: Once | ORAL | Status: DC
Start: 2013-06-26 — End: 2013-06-26

## 2013-06-26 MED ORDER — PREDNISONE 50 MG PO TABS
60.0000 mg | ORAL_TABLET | Freq: Once | ORAL | Status: AC
  Administered 2013-06-26: 60 mg via ORAL
  Filled 2013-06-26 (×2): qty 1

## 2013-06-26 MED ORDER — IPRATROPIUM-ALBUTEROL 0.5-2.5 (3) MG/3ML IN SOLN
3.0000 mL | Freq: Once | RESPIRATORY_TRACT | Status: AC
  Administered 2013-06-26: 3 mL via RESPIRATORY_TRACT
  Filled 2013-06-26: qty 3

## 2013-06-26 MED ORDER — PREDNISONE 50 MG PO TABS: 50.0000 mg | ORAL_TABLET | Freq: Every day | ORAL | Status: DC

## 2013-06-26 NOTE — Discharge Instructions (Signed)
Use the Albuterol with your child's nebulizer.  Bronchitis Bronchitis is inflammation of the airways that extend from the windpipe into the lungs (bronchi). The inflammation often causes mucus to develop, which leads to a cough. If the inflammation becomes severe, it may cause shortness of breath. CAUSES  Bronchitis may be caused by:   Viral infections.   Bacteria.   Cigarette smoke.   Allergens, pollutants, and other irritants.  SIGNS AND SYMPTOMS  The most common symptom of bronchitis is a frequent cough that produces mucus. Other symptoms include:  Fever.   Body aches.   Chest congestion.   Chills.   Shortness of breath.   Sore throat.  DIAGNOSIS  Bronchitis is usually diagnosed through a medical history and physical exam. Tests, such as chest X-rays, are sometimes done to rule out other conditions.  TREATMENT  You may need to avoid contact with whatever caused the problem (smoking, for example). Medicines are sometimes needed. These may include:  Antibiotics. These may be prescribed if the condition is caused by bacteria.  Cough suppressants. These may be prescribed for relief of cough symptoms.   Inhaled medicines. These may be prescribed to help open your airways and make it easier for you to breathe.   Steroid medicines. These may be prescribed for those with recurrent (chronic) bronchitis. HOME CARE INSTRUCTIONS  Get plenty of rest.   Drink enough fluids to keep your urine clear or pale yellow (unless you have a medical condition that requires fluid restriction). Increasing fluids may help thin your secretions and will prevent dehydration.   Only take over-the-counter or prescription medicines as directed by your health care provider.  Only take antibiotics as directed. Make sure you finish them even if you start to feel better.  Avoid secondhand smoke, irritating chemicals, and strong fumes. These will make bronchitis worse. If you are a  smoker, quit smoking. Consider using nicotine gum or skin patches to help control withdrawal symptoms. Quitting smoking will help your lungs heal faster.   Put a cool-mist humidifier in your bedroom at night to moisten the air. This may help loosen mucus. Change the water in the humidifier daily. You can also run the hot water in your shower and sit in the bathroom with the door closed for 5 10 minutes.   Follow up with your health care provider as directed.   Wash your hands frequently to avoid catching bronchitis again or spreading an infection to others.  SEEK MEDICAL CARE IF: Your symptoms do not improve after 1 week of treatment.  SEEK IMMEDIATE MEDICAL CARE IF:  Your fever increases.  You have chills.   You have chest pain.   You have worsening shortness of breath.   You have bloody sputum.  You faint.  You have lightheadedness.  You have a severe headache.   You vomit repeatedly. MAKE SURE YOU:   Understand these instructions.  Will watch your condition.  Will get help right away if you are not doing well or get worse. Document Released: 03/26/2005 Document Revised: 01/14/2013 Document Reviewed: 11/18/2012 [facility] Patient Information 2014 [place].  Prednisone tablets What is this medicine? PREDNISONE (PRED ni sone) is a corticosteroid. It is commonly used to treat inflammation of the skin, joints, lungs, and other organs. Common conditions treated include asthma, allergies, and arthritis. It is also used for other conditions, such as blood disorders and diseases of the adrenal glands. This medicine may be used for other purposes; ask your health care provider or pharmacist  if you have questions. COMMON BRAND NAME(S): Deltasone, Predone, Sterapred DS, Sterapred What should I tell my health care provider before I take this medicine? They need to know if you have any of these conditions: -Cushing's syndrome -diabetes -glaucoma -heart  disease -high blood pressure -infection (especially a virus infection such as chickenpox, cold sores, or herpes) -kidney disease -liver disease -mental illness -myasthenia gravis -osteoporosis -seizures -stomach or intestine problems -thyroid disease -an unusual or allergic reaction to lactose, prednisone, other medicines, foods, dyes, or preservatives -pregnant or trying to get pregnant -breast-feeding How should I use this medicine? Take this medicine by mouth with a glass of water. Follow the directions on the prescription label. Take this medicine with food. If you are taking this medicine once a day, take it in the morning. Do not take more medicine than you are told to take. Do not suddenly stop taking your medicine because you may develop a severe reaction. Your doctor will tell you how much medicine to take. If your doctor wants you to stop the medicine, the dose may be slowly lowered over time to avoid any side effects. Talk to your pediatrician regarding the use of this medicine in children. Special care may be needed. Overdosage: If you think you have taken too much of this medicine contact a poison control center or emergency room at once. NOTE: This medicine is only for you. Do not share this medicine with others. What if I miss a dose? If you miss a dose, take it as soon as you can. If it is almost time for your next dose, talk to your doctor or health care professional. You may need to miss a dose or take an extra dose. Do not take double or extra doses without advice. What may interact with this medicine? Do not take this medicine with any of the following medications: -metyrapone -mifepristone This medicine may also interact with the following medications: -aminoglutethimide -amphotericin B -aspirin and aspirin-like medicines -barbiturates -certain medicines for diabetes, like glipizide or glyburide -cholestyramine -cholinesterase  inhibitors -cyclosporine -digoxin -diuretics -ephedrine -female hormones, like estrogens and birth control pills -isoniazid -ketoconazole -NSAIDS, medicines for pain and inflammation, like ibuprofen or naproxen -phenytoin -rifampin -toxoids -vaccines -warfarin This list may not describe all possible interactions. Give your health care provider a list of all the medicines, herbs, non-prescription drugs, or dietary supplements you use. Also tell them if you smoke, drink alcohol, or use illegal drugs. Some items may interact with your medicine. What should I watch for while using this medicine? Visit your doctor or health care professional for regular checks on your progress. If you are taking this medicine over a prolonged period, carry an identification card with your name and address, the type and dose of your medicine, and your doctor's name and address. This medicine may increase your risk of getting an infection. Tell your doctor or health care professional if you are around anyone with measles or chickenpox, or if you develop sores or blisters that do not heal properly. If you are going to have surgery, tell your doctor or health care professional that you have taken this medicine within the last twelve months. Ask your doctor or health care professional about your diet. You may need to lower the amount of salt you eat. This medicine may affect blood sugar levels. If you have diabetes, check with your doctor or health care professional before you change your diet or the dose of your diabetic medicine. What side effects may I  notice from receiving this medicine? Side effects that you should report to your doctor or health care professional as soon as possible: -allergic reactions like skin rash, itching or hives, swelling of the face, lips, or tongue -changes in emotions or moods -changes in vision -depressed mood -eye pain -fever or chills, cough, sore throat, pain or difficulty  passing urine -increased thirst -swelling of ankles, feet Side effects that usually do not require medical attention (report to your doctor or health care professional if they continue or are bothersome): -confusion, excitement, restlessness -headache -nausea, vomiting -skin problems, acne, thin and shiny skin -trouble sleeping -weight gain This list may not describe all possible side effects. Call your doctor for medical advice about side effects. You may report side effects to FDA at 1-800-FDA-1088. Where should I keep my medicine? Keep out of the reach of children. Store at room temperature between 15 and 30 degrees C (59 and 86 degrees F). Protect from light. Keep container tightly closed. Throw away any unused medicine after the expiration date. NOTE: This sheet is a summary. It may not cover all possible information. If you have questions about this medicine, talk to your doctor, pharmacist, or health care provider.  2014, [employer]vier/[employer] Standard. (2010-11-09 10:57:14)  Albuterol inhalation solution What is this medicine? ALBUTEROL (al [place]ean [place]) is a bronchodilator. It helps to open up the airways in your lungs to make it easier to breathe. This medicine is used to treat and to prevent bronchospasm. This medicine may be used for other purposes; ask your health care provider or pharmacist if you have questions. COMMON BRAND NAME(S): Accuneb, Proventil What should I tell my health care provider before I take this medicine? They need to know if you have any of the following conditions: -diabetes -heart disease or irregular heartbeat -high blood pressure -pheochromocytoma -seizures -thyroid disease -an unusual or allergic reaction to albuterol, levalbuterol, sulfites, other medicines, foods, dyes, or preservatives -pregnant or trying to get pregnant -breast-feeding How should I use this medicine? This medicine is used in a nebulizer. Nebulizers make a liquid into an aerosol  that you breathe in through your mouth or your mouth and nose into your lungs. You will be taught how to use your nebulizer. Follow the directions on your prescription label. Take your medicine at regular intervals. Do not use more often than directed. Talk to your pediatrician regarding the use of this medicine in children. Special care may be needed. Overdosage: If you think you have taken too much of this medicine contact a poison control center or emergency room at once. NOTE: This medicine is only for you. Do not share this medicine with others. What if I miss a dose? If you miss a dose, use it as soon as you can. If it is almost time for your next dose, use only that dose. Do not use double or extra doses. What may interact with this medicine? -anti-infectives like chloroquine and pentamidine -caffeine -cisapride -diuretics -medicines for colds -medicines for depression or emotional or psychotic conditions -medicines for weight loss including some herbal products -methadone -some antibiotics like clarithromycin, erythromycin, levofloxacin, and linezolid -some heart medicines -steroid hormones like dexamethasone, cortisone, hydrocortisone -theophylline -thyroid hormones This list may not describe all possible interactions. Give your health care provider a list of all the medicines, herbs, non-prescription drugs, or dietary supplements you use. Also tell them if you smoke, drink alcohol, or use illegal drugs. Some items may interact with your medicine. What should I watch for  while using this medicine? Tell your doctor or health care professional if your symptoms do not improve. Do not use extra albuterol. Call your doctor right away if your asthma or bronchitis gets worse while you are using this medicine. If your mouth gets dry try chewing sugarless gum or sucking hard candy. Drink water as directed. What side effects may I notice from receiving this medicine? Side effects that you  should report to your doctor or health care professional as soon as possible: -allergic reactions like skin rash, itching or hives, swelling of the face, lips, or tongue -breathing problems -chest pain -feeling faint or lightheaded, falls -high blood pressure -irregular heartbeat -fever -muscle cramps or weakness -pain, tingling, numbness in the hands or feet -vomiting Side effects that usually do not require medical attention (report to your doctor or health care professional if they continue or are bothersome): -cough -difficulty sleeping -headache -nervousness, trembling -stomach upset -stuffy or runny nose -throat irritation -unusual taste This list may not describe all possible side effects. Call your doctor for medical advice about side effects. You may report side effects to FDA at 1-800-FDA-1088. Where should I keep my medicine? Keep out of the reach of children. Store between 2 and 25 degrees C (36 and 77 degrees F). Do not freeze. Protect from light. Throw away any unused medicine after the expiration date. Most products are kept in the foil package until time of use. Some products can be used up to 1 week after they are removed from the foil pouch. Check the instructions that come with your medicine. NOTE: This sheet is a summary. It may not cover all possible information. If you have questions about this medicine, talk to your doctor, pharmacist, or health care provider.  2014, Elsevier/Gold Standard. (2010-12-15 15:19:55)

## 2013-06-26 NOTE — ED Notes (Signed)
X-ray performed at bedside

## 2013-06-26 NOTE — ED Provider Notes (Signed)
CSN: [id number]     Arrival date & time 06/26/13  0117 History   First MD Initiated Contact with Patient 06/26/13 0208     Chief Complaint  Patient presents with  . Shortness of Breath    with cough and back pain x 2 weeks     (Consider location/radiation/quality/duration/timing/severity/associated sxs/prior Treatment) Patient is a 43 y.o. female presenting with shortness of breath. The history is provided by the patient.  Shortness of Breath She has been sick for about 2 weeks with a cough. Cough was initially productive but is now nonproductive. She saw her PCP who diagnosed her with bronchitis and gave her a prescription for prednisone and Levaquin. She states that there was some initial improvement but she then started getting worse again. CV came significantly dyspneic tonight and has also had some tight feeling in her chest. There is no nausea or vomiting. She's had chills but no fever or sweats. She denies arthralgias or myalgias.  Past Medical History  Diagnosis Date  . Diabetes mellitus   . Back pain   . Osteopenia   . Allergy   . Asthma   . Cancer   . Breast cancer   . Depression   . Anxiety   . Osteoporosis     osteopenia   Past Surgical History  Procedure Laterality Date  . Mastectomy    . Abdominal hysterectomy    . Lymphadenectomy    . Cesarean section    . Breast surgery    . Incision and drainage abscess Left 11/14/2012    Procedure: INCISION AND DRAINAGE ABSCESS LEFT GROIN;  Surgeon: Jamesetta So, MD;  Location: [facility];  Service: General;  Laterality: Left;   Family History  Problem Relation Age of Onset  . Cancer Mother 27    breast cancer  . Hypertension Sister   . Hypothyroidism Brother   . Cancer Maternal Grandmother 35    breast cancer  . Cancer Maternal Grandfather 55    pancreatic cancer   History  Substance Use Topics  . Smoking status: Former Smoker    Types: Cigarettes    Quit date: 04/21/2007  . Smokeless tobacco: Not on file  .  Alcohol Use: No   OB History   Grav Para Term Preterm Abortions TAB SAB Ect Mult Living                 Review of Systems  Respiratory: Positive for shortness of breath.   All other systems reviewed and are negative.      Allergies  Azithromycin; Morphine and related; Nitrofurantoin; Penicillins; and Sulfonamide derivatives  Home Medications   Current Outpatient Rx  Name  Route  Sig  Dispense  Refill  . albuterol (PROVENTIL HFA;VENTOLIN HFA) 108 (90 BASE) MCG/ACT inhaler   Inhalation   Inhale 1 puff into the lungs every 6 (six) hours as needed for wheezing.   1 Inhaler   10   . ALPRAZolam (XANAX) 1 MG tablet      TAKE 1 TABLET (1MG  TOTAL) BY MOUTH AT BEDTIME AS NEEDED FOR SLEEP   90 tablet   0     CYCLE FILL MEDICATION. Authorization is required f ...   . clonazePAM (KLONOPIN) 1 MG tablet   Oral   Take 1 tablet (1 mg total) by mouth 2 (two) times daily as needed for anxiety.   180 tablet   1     Needs office visit   . desvenlafaxine (PRISTIQ) 100 MG 24 hr tablet  Oral   Take 1 tablet (100 mg total) by mouth daily.   90 tablet   3   . doxycycline (VIBRA-TABS) 100 MG tablet   Oral   Take 1 tablet (100 mg total) by mouth 2 (two) times daily.   20 tablet   0   . EXPIRED: fluticasone (FLONASE) 50 MCG/ACT nasal spray   Nasal   Place 2 sprays into the nose daily.   1 g   12   . HYDROcodone-acetaminophen (NORCO/VICODIN) 5-325 MG per tablet   Oral   Take 2 tablets by mouth every 4 (four) hours as needed.   80 tablet   0   . insulin glargine (LANTUS) 100 UNIT/ML injection   Subcutaneous   Inject 0.2 mLs (20 Units total) into the skin daily.   10 mL   11   . ketoconazole (NIZORAL) 2 % cream   Topical   Apply topically daily.   15 g   0   . ketoconazole (NIZORAL) 200 MG tablet   Oral   Take 1 tablet (200 mg total) by mouth daily.   10 tablet   0   . metFORMIN (GLUCOPHAGE) 500 MG tablet   Oral   Take 2 tablets (1,000 mg total) by mouth 2  (two) times daily with a meal.   240 tablet   3   . omeprazole (PRILOSEC) 20 MG capsule   Oral   Take 1 capsule (20 mg total) by mouth daily.   30 capsule   1     PATIENT NEEDS OFFICE VISIT FOR ADDITIONAL REFILLS   . promethazine (PHENERGAN) 25 MG tablet   Oral   Take 1 tablet (25 mg total) by mouth every 6 (six) hours as needed for nausea.   30 tablet   0   . traMADol (ULTRAM) 50 MG tablet   Oral   Take 1 tablet (50 mg total) by mouth every 8 (eight) hours as needed for pain.   30 tablet   0   . Zoledronic Acid (ZOMETA IV)   Intravenous   Inject into the vein. Every 6 months          BP 111/72  Pulse 85  Temp(Src) 98.2 F (36.8 C) (Oral)  Resp 20  Ht 5\' 2"  (1.575 m)  Wt 196 lb (88.905 kg)  BMI 35.84 kg/m2  SpO2 94% Physical Exam  Nursing note and vitals reviewed.  43 year old female, resting comfortably and in no acute distress. Vital signs are normal. Oxygen saturation is 94%, which is normal. Head is normocephalic and atraumatic. PERRLA, EOMI. Oropharynx is clear. Neck is nontender and supple without adenopathy or JVD. Back is nontender and there is no CVA tenderness. Lungs have moderate expiratory wheezing. There no rales or rhonchi. Chest is nontender. Heart has regular rate and rhythm without murmur. Abdomen is soft, flat, nontender without masses or hepatosplenomegaly and peristalsis is normoactive. Extremities have no cyanosis or edema, full range of motion is present. Skin is warm and dry without rash. Neurologic: Mental status is normal, cranial nerves are intact, there are no motor or sensory deficits.  ED Course  Procedures (including critical care time) Labs Review Results for orders placed during the hospital encounter of 06/26/13  CBC      Result Value Ref Range   WBC 10.8 (*) 4.0 - 10.5 K/uL   RBC 5.11  3.87 - 5.11 MIL/uL   Hemoglobin 14.7  12.0 - 15.0 g/dL   HCT 42.8  36.0 - 46.0 %  MCV 83.8  78.0 - 100.0 fL   MCH 28.8  26.0 - 34.0  pg   MCHC 34.3  30.0 - 36.0 g/dL   RDW 15.2  11.5 - 15.5 %   Platelets 216  150 - 400 K/uL  COMPREHENSIVE METABOLIC PANEL      Result Value Ref Range   Sodium 133 (*) 137 - 147 mEq/L   Potassium 3.8  3.7 - 5.3 mEq/L   Chloride 94 (*) 96 - 112 mEq/L   CO2 26  19 - 32 mEq/L   Glucose, Bld 364 (*) 70 - 99 mg/dL   BUN 9  6 - 23 mg/dL   Creatinine, Ser 0.50  0.50 - 1.10 mg/dL   Calcium 9.3  8.4 - 10.5 mg/dL   Total Protein 7.2  6.0 - 8.3 g/dL   Albumin 3.4 (*) 3.5 - 5.2 g/dL   AST 5  0 - 37 U/L   ALT 5  0 - 35 U/L   Alkaline Phosphatase 80  39 - 117 U/L   Total Bilirubin 0.3  0.3 - 1.2 mg/dL   GFR calc non Af Amer >90  >90 mL/min   GFR calc Af Amer >90  >90 mL/min  PRO B NATRIURETIC PEPTIDE      Result Value Ref Range   Pro B Natriuretic peptide (BNP) <5.0  0 - 125 pg/mL  TROPONIN I      Result Value Ref Range   Troponin I <0.30  <0.30 ng/mL  DIFFERENTIAL      Result Value Ref Range   Neutro Abs 6.8  1.7 - 7.7 K/uL   Lymphs Abs 3.7  0.7 - 4.0 K/uL   Monocytes Absolute 0.7  0.1 - 1.0 K/uL   Eosinophils Absolute 0.2  0.0 - 0.7 K/uL   Basophils Absolute 0.1  0.0 - 0.1 K/uL   Neutrophils Relative % 59  43 - 77 %   Lymphocytes Relative 32  12 - 46 %   Monocytes Relative 7  3 - 12 %   Eosinophils Relative 1  0 - 5 %   Basophils Relative 1  0 - 1 %   Band Neutrophils 0  0 - 10 %   Metamyelocytes Relative 0     Myelocytes 0     Promyelocytes Absolute 0     Blasts 0     nRBC 0  0 /100 WBC   RBC Morphology TEARDROP CELLS     WBC Morphology ATYPICAL LYMPHOCYTES      Imaging Review Dg Chest Portable 1 View  06/26/2013   CLINICAL DATA:  Chest pain  EXAM: PORTABLE CHEST - 1 VIEW  COMPARISON:  Prior radiograph from 04/10/2013  FINDINGS: The cardiac and mediastinal silhouettes are stable in size and contour, and remain within normal limits.  The lungs are normally inflated. No airspace consolidation, pleural effusion, or pulmonary edema is identified. There is no pneumothorax.  No  acute osseous abnormality identified. Few surgical clips overlie the left chest wall.  IMPRESSION: No acute cardiopulmonary abnormality.   Electronically Signed   By: Jeannine Boga M.D.   On: 06/26/2013 02:12   Images viewed by me.   EKG Interpretation   Date/Time:  Friday June 26 2013 01:31:32 EDT Ventricular Rate:  87 PR Interval:  158 QRS Duration: 78 QT Interval:  360 QTC Calculation: 433 R Axis:   76 Text Interpretation:  Normal sinus rhythm Septal infarct , age  undetermined Abnormal ECG When compared with ECG of 11/17/2012, No  significant change was found Confirmed by [facility]  MD, Inanna Telford ([id number]) on  06/26/2013 2:21:25 AM      MDM   Final diagnoses:  Bronchitis    Acute bronchitis with which failed to respond to initial course of prednisone and antibiotics. Chest x-ray has been obtained and I do not see any evidence of pneumonia and do not anticipate giving her a different antibiotic. She is given a dose of prednisone and she's given an albuterol with ipratropium nebulizer treatment in the ED.  Following nebulizer treatment, she felt considerably better and lungs almost completely clear. Good second nebulizer treatment was ordered the patient stated she felt jittery from the first one and did not want another one. She states she does have albuterol inhaler at home but does not have a nebulizer for anterior child has a nebulizer but she did not want to use her child's medication. She is discharged with prescription for prednisone and a prescription for albuterol solution for nebulizer. She is to followup with her PCP.  Delora Fuel, MD [phone]

## 2013-06-26 NOTE — ED Notes (Signed)
Pt states she has been having cough and cold like sx x2 weeks was seen by PCP and dx with bronchitis, pt was given levaquin and prednisone which she has finished. Pt states today she started feeling short of breath

## 2014-01-24 ENCOUNTER — Encounter (HOSPITAL_COMMUNITY): Payer: Self-pay | Admitting: Emergency Medicine

## 2014-01-24 ENCOUNTER — Emergency Department (HOSPITAL_COMMUNITY): Payer: Medicare Other

## 2014-01-24 ENCOUNTER — Emergency Department (HOSPITAL_COMMUNITY)
Admission: EM | Admit: 2014-01-24 | Discharge: 2014-01-24 | Disposition: A | Discharge status: Home / Self Care | Payer: Medicare Other | Attending: Emergency Medicine | Admitting: Emergency Medicine

## 2014-01-24 DIAGNOSIS — J45909 Unspecified asthma, uncomplicated: Secondary | ICD-10-CM | POA: Insufficient documentation

## 2014-01-24 DIAGNOSIS — F329 Major depressive disorder, single episode, unspecified: Secondary | ICD-10-CM | POA: Diagnosis not present

## 2014-01-24 DIAGNOSIS — F419 Anxiety disorder, unspecified: Secondary | ICD-10-CM | POA: Insufficient documentation

## 2014-01-24 DIAGNOSIS — Z9049 Acquired absence of other specified parts of digestive tract: Secondary | ICD-10-CM | POA: Insufficient documentation

## 2014-01-24 DIAGNOSIS — Z794 Long term (current) use of insulin: Secondary | ICD-10-CM | POA: Insufficient documentation

## 2014-01-24 DIAGNOSIS — Z853 Personal history of malignant neoplasm of breast: Secondary | ICD-10-CM | POA: Insufficient documentation

## 2014-01-24 DIAGNOSIS — Z792 Long term (current) use of antibiotics: Secondary | ICD-10-CM | POA: Insufficient documentation

## 2014-01-24 DIAGNOSIS — Z8739 Personal history of other diseases of the musculoskeletal system and connective tissue: Secondary | ICD-10-CM | POA: Insufficient documentation

## 2014-01-24 DIAGNOSIS — Z9889 Other specified postprocedural states: Secondary | ICD-10-CM | POA: Insufficient documentation

## 2014-01-24 DIAGNOSIS — L03115 Cellulitis of right lower limb: Secondary | ICD-10-CM | POA: Insufficient documentation

## 2014-01-24 DIAGNOSIS — R1031 Right lower quadrant pain: Secondary | ICD-10-CM | POA: Diagnosis present

## 2014-01-24 DIAGNOSIS — Z880 Allergy status to penicillin: Secondary | ICD-10-CM | POA: Diagnosis not present

## 2014-01-24 DIAGNOSIS — M549 Dorsalgia, unspecified: Secondary | ICD-10-CM | POA: Diagnosis not present

## 2014-01-24 DIAGNOSIS — Z7952 Long term (current) use of systemic steroids: Secondary | ICD-10-CM | POA: Insufficient documentation

## 2014-01-24 DIAGNOSIS — E119 Type 2 diabetes mellitus without complications: Secondary | ICD-10-CM | POA: Insufficient documentation

## 2014-01-24 DIAGNOSIS — Z87891 Personal history of nicotine dependence: Secondary | ICD-10-CM | POA: Insufficient documentation

## 2014-01-24 DIAGNOSIS — Z79899 Other long term (current) drug therapy: Secondary | ICD-10-CM | POA: Diagnosis not present

## 2014-01-24 LAB — URINALYSIS, ROUTINE W REFLEX MICROSCOPIC
Bilirubin Urine: NEGATIVE
Glucose, UA: >1000 mg/dL — AB
HGB URINE DIPSTICK: NEGATIVE
KETONES UR: NEGATIVE mg/dL
Leukocytes, UA: NEGATIVE
Nitrite: NEGATIVE
PROTEIN: NEGATIVE mg/dL
Specific Gravity, Urine: 1.01 (ref 1.005–1.030)
Urobilinogen, UA: 0.2 mg/dL (ref 0.0–1.0)
pH: 5.5 (ref 5.0–8.0)

## 2014-01-24 LAB — CBC WITH DIFFERENTIAL/PLATELET
BASOS ABS: 0.1 10*3/uL (ref 0.0–0.1)
Basophils Relative: 1 % (ref 0–1)
Eosinophils Absolute: 0.3 10*3/uL (ref 0.0–0.7)
Eosinophils Relative: 3 % (ref 0–5)
HEMATOCRIT: 46.4 % — AB (ref 36.0–46.0)
HEMOGLOBIN: 15.7 g/dL — AB (ref 12.0–15.0)
LYMPHS ABS: 4.5 10*3/uL — AB (ref 0.7–4.0)
LYMPHS PCT: 40 % (ref 12–46)
MCH: 29 pg (ref 26.0–34.0)
MCHC: 33.8 g/dL (ref 30.0–36.0)
MCV: 85.8 fL (ref 78.0–100.0)
MONO ABS: 0.7 10*3/uL (ref 0.1–1.0)
Monocytes Relative: 6 % (ref 3–12)
NEUTROS ABS: 5.6 10*3/uL (ref 1.7–7.7)
Neutrophils Relative %: 50 % (ref 43–77)
Platelets: 291 10*3/uL (ref 150–400)
RBC: 5.41 MIL/uL — AB (ref 3.87–5.11)
RDW: 14.6 % (ref 11.5–15.5)
WBC: 11.1 10*3/uL — AB (ref 4.0–10.5)

## 2014-01-24 LAB — URINE MICROSCOPIC-ADD ON

## 2014-01-24 LAB — COMPREHENSIVE METABOLIC PANEL
ALT: 22 U/L (ref 0–35)
AST: 16 U/L (ref 0–37)
Albumin: 4.3 g/dL (ref 3.5–5.2)
Alkaline Phosphatase: 51 U/L (ref 39–117)
Anion gap: 12 (ref 5–15)
BILIRUBIN TOTAL: 0.3 mg/dL (ref 0.3–1.2)
BUN: 13 mg/dL (ref 6–23)
CHLORIDE: 101 meq/L (ref 96–112)
CO2: 28 meq/L (ref 19–32)
Calcium: 10.5 mg/dL (ref 8.4–10.5)
Creatinine, Ser: 0.72 mg/dL (ref 0.50–1.10)
GFR calc Af Amer: >90 mL/min (ref >90)
Glucose, Bld: 172 mg/dL — ABNORMAL HIGH (ref 70–99)
Potassium: 3.6 mEq/L — ABNORMAL LOW (ref 3.7–5.3)
Sodium: 141 mEq/L (ref 137–147)
Total Protein: 8.6 g/dL — ABNORMAL HIGH (ref 6.0–8.3)

## 2014-01-24 MED ORDER — VANCOMYCIN HCL IN DEXTROSE 1-5 GM/200ML-% IV SOLN
1000.0000 mg | Freq: Once | INTRAVENOUS | Status: AC
  Administered 2014-01-24: 1000 mg via INTRAVENOUS
  Filled 2014-01-24: qty 200

## 2014-01-24 MED ORDER — HYDROMORPHONE HCL 1 MG/ML IJ SOLN
1.0000 mg | Freq: Once | INTRAMUSCULAR | Status: AC
  Administered 2014-01-24: 1 mg via INTRAVENOUS
  Filled 2014-01-24: qty 1

## 2014-01-24 MED ORDER — IOHEXOL 300 MG/ML  SOLN
50.0000 mL | Freq: Once | INTRAMUSCULAR | Status: AC | PRN
  Administered 2014-01-24: 50 mL via ORAL

## 2014-01-24 MED ORDER — HYDROCODONE-ACETAMINOPHEN 5-325 MG PO TABS: 1.0000 | ORAL_TABLET | Freq: Four times a day (QID) | ORAL | Status: DC | PRN

## 2014-01-24 MED ORDER — ONDANSETRON HCL 4 MG/2ML IJ SOLN
4.0000 mg | Freq: Once | INTRAMUSCULAR | Status: AC
  Administered 2014-01-24: 4 mg via INTRAVENOUS
  Filled 2014-01-24: qty 2

## 2014-01-24 MED ORDER — IOHEXOL 300 MG/ML  SOLN
100.0000 mL | Freq: Once | INTRAMUSCULAR | Status: AC | PRN
  Administered 2014-01-24: 100 mL via INTRAVENOUS

## 2014-01-24 MED ORDER — DOXYCYCLINE HYCLATE 100 MG PO CAPS: 100.0000 mg | ORAL_CAPSULE | Freq: Two times a day (BID) | ORAL | Status: DC

## 2014-01-24 NOTE — Discharge Instructions (Signed)
Follow up with your md this week. °

## 2014-01-24 NOTE — ED Provider Notes (Signed)
CSN: [id number]     Arrival date & time 01/24/14  0020 History  This chart was scribed for Ann Diego, MD by Jeanell Sparrow, ED Scribe. This patient was seen in room [facility] and the patient's care was started at 12:34 AM.   Chief Complaint  Patient presents with  . Abdominal Pain    denies vomiting and diarrhea. pain to ruq  . Abscess    knot to right groin which comes and goes away.    Patient is a 43 y.o. female presenting with abdominal pain and abscess. The history is provided by the patient. No language interpreter was used.  Abdominal Pain Pain location:  RLQ Pain quality: sharp and stabbing   Pain radiates to:  Back Pain severity:  Moderate Onset quality:  Sudden Duration:  1 day Timing:  Constant Progression:  Unchanged Chronicity:  New Relieved by:  None tried Worsened by:  Nothing tried Ineffective treatments:  None tried Associated symptoms: no chest pain, no cough, no diarrhea, no fatigue, no hematuria and no vomiting   Abscess Associated symptoms: no fatigue, no headaches and no vomiting    HPI Comments: Ann Lopez is a 43 y.o. female who presents to the Emergency Department complaining of constant moderate RLQ abdominal pain that started this morning. She describes the pain as a sharp then stabbing sensation. She reports no modifying factors. She reports that she has some associated back pain. She states that there is a possible abscess in her groin area. She states that she has a hx of abdominal surgeries. She denies any hx of cholecystectomy. She denies any emesis or diarrhea.   Past Medical History  Diagnosis Date  . Diabetes mellitus   . Back pain   . Osteopenia   . Allergy   . Asthma   . Cancer   . Breast cancer   . Depression   . Anxiety   . Osteoporosis     osteopenia   Past Surgical History  Procedure Laterality Date  . Mastectomy    . Abdominal hysterectomy    . Lymphadenectomy    . Cesarean section    . Breast surgery    .  Incision and drainage abscess Left 11/14/2012    Procedure: INCISION AND DRAINAGE ABSCESS LEFT GROIN;  Surgeon: Jamesetta So, MD;  Location: [facility];  Service: General;  Laterality: Left;   Family History  Problem Relation Age of Onset  . Cancer Mother 16    breast cancer  . Hypertension Sister   . Hypothyroidism Brother   . Cancer Maternal Grandmother 42    breast cancer  . Cancer Maternal Grandfather 55    pancreatic cancer   History  Substance Use Topics  . Smoking status: Former Smoker    Types: Cigarettes    Quit date: 04/21/2007  . Smokeless tobacco: Not on file  . Alcohol Use: No   OB History   Grav Para Term Preterm Abortions TAB SAB Ect Mult Living                 Review of Systems  Constitutional: Negative for appetite change and fatigue.  HENT: Negative for congestion, ear discharge and sinus pressure.   Eyes: Negative for discharge.  Respiratory: Negative for cough.   Cardiovascular: Negative for chest pain.  Gastrointestinal: Positive for abdominal pain. Negative for vomiting and diarrhea.  Genitourinary: Negative for frequency and hematuria.  Musculoskeletal: Positive for back pain.  Skin: Negative for rash.  Neurological: Negative for seizures  and headaches.  Psychiatric/Behavioral: Negative for hallucinations.    Allergies  Azithromycin; Morphine and related; Nitrofurantoin; Penicillins; and Sulfonamide derivatives  Home Medications   Prior to Admission medications   Medication Sig Start Date End Date Taking? Authorizing Provider  albuterol (PROVENTIL HFA;VENTOLIN HFA) 108 (90 BASE) MCG/ACT inhaler Inhale 1 puff into the lungs every 6 (six) hours as needed for wheezing. 08/28/12 08/28/13  Robyn Haber, MD  albuterol (PROVENTIL) (2.5 MG/3ML) 0.083% nebulizer solution Take 3 mLs (2.5 mg total) by nebulization every 6 (six) hours as needed for wheezing or shortness of breath. 5/36/64   Delora Fuel, MD  ALPRAZolam Duanne Moron) 1 MG tablet TAKE 1 TABLET (1MG   TOTAL) BY MOUTH AT BEDTIME AS NEEDED FOR SLEEP 10/26/12   Robyn Haber, MD  clonazePAM (KLONOPIN) 1 MG tablet Take 1 tablet (1 mg total) by mouth 2 (two) times daily as needed for anxiety. 03/08/12   Robyn Haber, MD  desvenlafaxine (PRISTIQ) 100 MG 24 hr tablet Take 1 tablet (100 mg total) by mouth daily. 03/08/12   Robyn Haber, MD  doxycycline (VIBRA-TABS) 100 MG tablet Take 1 tablet (100 mg total) by mouth 2 (two) times daily. 12/01/12   Robyn Haber, MD  fluticasone (FLONASE) 50 MCG/ACT nasal spray Place 2 sprays into the nose daily. 03/08/12 03/08/13  Robyn Haber, MD  HYDROcodone-acetaminophen (NORCO/VICODIN) 5-325 MG per tablet Take 2 tablets by mouth every 4 (four) hours as needed. 11/20/12   [place], MD  insulin glargine (LANTUS) 100 UNIT/ML injection Inject 0.2 mLs (20 Units total) into the skin daily. 11/17/12   Nimish Luther Parody, MD  ketoconazole (NIZORAL) 2 % cream Apply topically daily. 12/01/12   Robyn Haber, MD  ketoconazole (NIZORAL) 200 MG tablet Take 1 tablet (200 mg total) by mouth daily. 12/01/12   Robyn Haber, MD  metFORMIN (GLUCOPHAGE) 500 MG tablet Take 2 tablets (1,000 mg total) by mouth 2 (two) times daily with a meal. 08/28/12   Robyn Haber, MD  omeprazole (PRILOSEC) 20 MG capsule Take 1 capsule (20 mg total) by mouth daily. 08/21/12   Robyn Haber, MD  predniSONE (DELTASONE) 50 MG tablet Take 1 tablet (50 mg total) by mouth daily. 07/10/45   Delora Fuel, MD  promethazine (PHENERGAN) 25 MG tablet Take 1 tablet (25 mg total) by mouth every 6 (six) hours as needed for nausea. 11/06/12   Seabron Spates, CNM  traMADol (ULTRAM) 50 MG tablet Take 1 tablet (50 mg total) by mouth every 8 (eight) hours as needed for pain. 12/01/12   Robyn Haber, MD  Zoledronic Acid (ZOMETA IV) Inject into the vein. Every 6 months    Historical Provider, MD   BP 111/55  Pulse 54  Resp 20  Ht 5\' 2"  (1.575 m)  Wt 189 lb (85.73 kg)  BMI 34.56 kg/m2  SpO2  100% Physical Exam  Nursing note and vitals reviewed. Constitutional: She is oriented to person, place, and time. She appears well-developed.  HENT:  Head: Normocephalic.  Eyes: Conjunctivae and EOM are normal. No scleral icterus.  Neck: Neck supple. No thyromegaly present.  Cardiovascular: Normal rate and regular rhythm.  Exam reveals no gallop and no friction rub.   No murmur heard. Pulmonary/Chest: No stridor. She has no wheezes. She has no rales. She exhibits no tenderness.  Abdominal: She exhibits no distension. There is tenderness. There is no rebound.  Moderate RLQ TTP. Minimal right flank TTP. TTP in right groin area with swelling 1 cm in diameter.   Musculoskeletal: Normal range  of motion. She exhibits no edema.  Lymphadenopathy:    She has no cervical adenopathy.  Neurological: She is oriented to person, place, and time. She exhibits normal muscle tone. Coordination normal.  Skin: No rash noted. No erythema.  Psychiatric: She has a normal mood and affect. Her behavior is normal.    ED Course  Procedures (including critical care time) DIAGNOSTIC STUDIES: Oxygen Saturation is 100% on RA, normal by my interpretation.    COORDINATION OF CARE: 12:39 AM- Pt advised of plan for treatment which includes medication and labs and pt agrees.  Labs Review Labs Reviewed - No data to display  Imaging Review No results found.   EKG Interpretation None      MDM   Final diagnoses:  None    Cellulitis,  tx with doxy  Ann Diego, MD 01/24/14 [phone]

## 2014-03-02 ENCOUNTER — Ambulatory Visit (INDEPENDENT_AMBULATORY_CARE_PROVIDER_SITE_OTHER): Payer: 59 | Admitting: Family Medicine

## 2014-03-02 VITALS — BP 105/80 | HR 89 | Temp 98.1°F | Resp 16 | Ht 62.0 in | Wt 192.0 lb

## 2014-03-02 DIAGNOSIS — M5489 Other dorsalgia: Secondary | ICD-10-CM

## 2014-03-02 DIAGNOSIS — L02214 Cutaneous abscess of groin: Secondary | ICD-10-CM

## 2014-03-02 MED ORDER — HYDROCODONE-ACETAMINOPHEN 7.5-325 MG PO TABS: 1.0000 | ORAL_TABLET | Freq: Two times a day (BID) | ORAL | Status: DC | PRN

## 2014-03-02 MED ORDER — DOXYCYCLINE HYCLATE 100 MG PO CAPS: 100.0000 mg | ORAL_CAPSULE | Freq: Two times a day (BID) | ORAL | Status: DC

## 2014-03-02 NOTE — Progress Notes (Signed)
[facility] [address], [place] [address] [phone]  Date:  03/02/2014   Name:  Ann Lopez   DOB:  29-Nov-1970   MRN:  [id number]  PCP:  Hayden Rasmussen., MD    Chief Complaint: Back Pain and abcess   History of Present Illness:  Ann Lopez is a 43 y.o. very pleasant female patient who presents with the following:  She is here today because "iIm falling apart."   She notes an abscess on the right medial thigh. She as in the ER last month and had a CT scan which showed just the infection in her groin.  She was treated with doxycyline.   Over the last year or so she saw Dr. Darron Doom "4 times" and was treated with abx which would help temporarily.   However Dr. Darron Doom is no longer at her last practice so she came here to see [place]. Also, she would like a RF on her norco that she uses for chronic back pain/    She notes that 2 weeks ago she pressed on the area in her groin and a lot of pus came out. This am early she noted pain. Pressed on the area and "pus hit the ceiling."  She thinks she got all the pus out but wanted to come in to be checked in case she neede dabx   She has not measured a fever but has felt "like my eyes were on fire a couple of times."  She would use iburpfoen for this as needed  BP Readings from Last 3 Encounters:  03/02/14 92/70  01/24/14 111/55  06/26/13 111/72       Patient Active Problem List   Diagnosis Date Noted  . Anasarca 11/18/2012  . Sepsis 11/08/2012  . Community acquired pneumonia 11/08/2012  . Dehydration 11/08/2012  . Abscess of groin, left 11/08/2012  . Dyslipidemia 09/10/2011  . BRCA1 positive 09/10/2011  . Breast cancer 09/10/2011  . H/O gastric bypass; 05/14/11([facility]) 09/10/2011  . HYPERTENSION 10/28/2008  . SHORTNESS OF BREATH (SOB) 10/28/2008  . OBESITY 02/19/2007  . DEPRESSION/ANXIETY 02/19/2007  . CARPAL TUNNEL SYNDROME, RIGHT 02/19/2007  . ALLERGIC RHINITIS 02/19/2007  . GERD 02/19/2007  .  IRRITABLE BOWEL SYNDROME 02/19/2007  . HERPES GENITALIS 12/30/2006  . DM 12/30/2006  . Other and Unspecified Hyperlipidemia 12/30/2006  . MICROCYTIC ANEMIA 12/30/2006  . DEPRESSION 12/30/2006  . OSTEOPENIA 12/30/2006  . ANOMALY, CONGENITAL, NERVOUS SYSTEM NEC 12/30/2006    Past Medical History  Diagnosis Date  . Diabetes mellitus   . Back pain   . Osteopenia   . Allergy   . Asthma   . Cancer   . Breast cancer   . Depression   . Anxiety   . Osteoporosis     osteopenia    Past Surgical History  Procedure Laterality Date  . Mastectomy    . Abdominal hysterectomy    . Lymphadenectomy    . Cesarean section    . Breast surgery    . Incision and drainage abscess Left 11/14/2012    Procedure: INCISION AND DRAINAGE ABSCESS LEFT GROIN;  Surgeon: Jamesetta So, MD;  Location: [facility];  Service: General;  Laterality: Left;    History  Substance Use Topics  . Smoking status: Former Smoker    Types: Cigarettes    Quit date: 04/21/2007  . Smokeless tobacco: Not on file  . Alcohol Use: No    Family History  Problem Relation Age of Onset  .  Cancer Mother 73    breast cancer  . Hypertension Sister   . Hypothyroidism Brother   . Cancer Maternal Grandmother 26    breast cancer  . Cancer Maternal Grandfather 55    pancreatic cancer    Allergies  Allergen Reactions  . Azithromycin     Unknown  . Morphine And Related     Can tolerate hydrocodone and dilaudid without side effects  . Nitrofurantoin     Unknown  . Penicillins Hives and Itching  . Sulfonamide Derivatives Hives    Medication list has been reviewed and updated.  Current Outpatient Prescriptions on File Prior to Visit  Medication Sig Dispense Refill  . albuterol (PROVENTIL) (2.5 MG/3ML) 0.083% nebulizer solution Take 3 mLs (2.5 mg total) by nebulization every 6 (six) hours as needed for wheezing or shortness of breath. 75 mL 12  . ALPRAZolam (XANAX) 1 MG tablet TAKE 1 TABLET (1MG TOTAL) BY MOUTH AT BEDTIME  AS NEEDED FOR SLEEP 90 tablet 0  . clonazePAM (KLONOPIN) 1 MG tablet Take 1 tablet (1 mg total) by mouth 2 (two) times daily as needed for anxiety. 180 tablet 1  . desvenlafaxine (PRISTIQ) 100 MG 24 hr tablet Take 1 tablet (100 mg total) by mouth daily. 90 tablet 3  . ketoconazole (NIZORAL) 2 % cream Apply topically daily. 15 g 0  . omeprazole (PRILOSEC) 20 MG capsule Take 1 capsule (20 mg total) by mouth daily. 30 capsule 1  . Zoledronic Acid (ZOMETA IV) Inject into the vein. Every 6 months    . albuterol (PROVENTIL HFA;VENTOLIN HFA) 108 (90 BASE) MCG/ACT inhaler Inhale 1 puff into the lungs every 6 (six) hours as needed for wheezing. 1 Inhaler 10  . fluticasone (FLONASE) 50 MCG/ACT nasal spray Place 2 sprays into the nose daily. 1 g 12   No current facility-administered medications on file prior to visit.    Review of Systems:  As per HPI- otherwise negative.   Physical Examination: Filed Vitals:   03/02/14 1022  BP: 92/70  Pulse: 89  Temp: 98.1 F (36.7 C)  Resp: 16   Filed Vitals:   03/02/14 1022  Height: 5' 2" (1.575 m)  Weight: 192 lb (87.091 kg)   Body mass index is 35.11 kg/(m^2). Ideal Body Weight: Weight in (lb) to have BMI = 25: 136.4  GEN: WDWN, NAD, Non-toxic, A & O x 3, obese, looks well HEENT: Atraumatic, Normocephalic. Neck supple. No masses, No LAD. Ears and Nose: No external deformity. CV: RRR, No M/G/R. No JVD. No thrill. No extra heart sounds. PULM: CTA B, no wheezes, crackles, rhonchi. No retractions. No resp. distress. No accessory muscle use. ABD: S, NT, ND, +BS. No rebound. No HSM. EXTR: No c/c/e NEURO Normal gait.  PSYCH: Normally interactive. Conversant. Not depressed or anxious appearing.  Calm demeanor.  Right groin: there is a small hole where an abscess has drained already.  Was able to express just enough purulence to get a culture.  The area is located directly over the femoral artery so do not plan to enlarge the hold  Assessment and  Plan: Abscess of right groin - Plan: Wound culture, doxycycline (VIBRAMYCIN) 100 MG capsule  Other back pain - Plan: HYDROcodone-acetaminophen (NORCO) 7.5-325 MG per tablet  Doxycycline for recurrent abscess of right groin and doxycycline Refilled her norco for one month. She takes it twice a day  Meds ordered this encounter  Medications  . DISCONTD: HYDROcodone-acetaminophen (NORCO) 7.5-325 MG per tablet    Sig: Take  1 tablet by mouth every 6 (six) hours as needed for moderate pain.  Marland Kitchen lamoTRIgine (LAMICTAL) 100 MG tablet    Sig: Take 100 mg by mouth daily.  . canagliflozin (INVOKANA) 300 MG TABS tablet    Sig: Take 300 mg by mouth daily before breakfast.  . HYDROcodone-acetaminophen (NORCO) 7.5-325 MG per tablet    Sig: Take 1 tablet by mouth 2 (two) times daily as needed for moderate pain.    Dispense:  60 tablet    Refill:  0  . doxycycline (VIBRAMYCIN) 100 MG capsule    Sig: Take 1 capsule (100 mg total) by mouth 2 (two) times daily.    Dispense:  20 capsule    Refill:  0   She plans to re-establish with one of her previous doctors for further maintenance care   Signed Lamar Blinks, MD

## 2014-03-02 NOTE — Patient Instructions (Signed)
I will be in touch with your would culture.  Use a warm compresses on your wound 2 or 3 x a day.  Take the doxycycline with food twice a day You can continue to use the pain medication as needed

## 2014-03-04 LAB — WOUND CULTURE
GRAM STAIN: NONE SEEN
GRAM STAIN: NONE SEEN
Gram Stain: NONE SEEN

## 2014-03-05 ENCOUNTER — Encounter: Payer: Self-pay | Admitting: Family Medicine

## 2014-03-10 ENCOUNTER — Emergency Department (HOSPITAL_COMMUNITY): Payer: Medicare Other

## 2014-03-10 ENCOUNTER — Emergency Department (HOSPITAL_COMMUNITY)
Admission: EM | Admit: 2014-03-10 | Discharge: 2014-03-10 | Disposition: A | Discharge status: Home / Self Care | Payer: Medicare Other | Attending: Emergency Medicine | Admitting: Emergency Medicine

## 2014-03-10 ENCOUNTER — Encounter (HOSPITAL_COMMUNITY): Payer: Self-pay | Admitting: *Deleted

## 2014-03-10 DIAGNOSIS — R0789 Other chest pain: Secondary | ICD-10-CM | POA: Diagnosis not present

## 2014-03-10 DIAGNOSIS — R509 Fever, unspecified: Secondary | ICD-10-CM | POA: Diagnosis not present

## 2014-03-10 DIAGNOSIS — Z87891 Personal history of nicotine dependence: Secondary | ICD-10-CM | POA: Diagnosis not present

## 2014-03-10 DIAGNOSIS — F329 Major depressive disorder, single episode, unspecified: Secondary | ICD-10-CM | POA: Insufficient documentation

## 2014-03-10 DIAGNOSIS — R69 Illness, unspecified: Secondary | ICD-10-CM

## 2014-03-10 DIAGNOSIS — Z853 Personal history of malignant neoplasm of breast: Secondary | ICD-10-CM | POA: Insufficient documentation

## 2014-03-10 DIAGNOSIS — Z7952 Long term (current) use of systemic steroids: Secondary | ICD-10-CM | POA: Diagnosis not present

## 2014-03-10 DIAGNOSIS — F419 Anxiety disorder, unspecified: Secondary | ICD-10-CM | POA: Diagnosis not present

## 2014-03-10 DIAGNOSIS — Z79899 Other long term (current) drug therapy: Secondary | ICD-10-CM | POA: Insufficient documentation

## 2014-03-10 DIAGNOSIS — Z880 Allergy status to penicillin: Secondary | ICD-10-CM | POA: Insufficient documentation

## 2014-03-10 DIAGNOSIS — E119 Type 2 diabetes mellitus without complications: Secondary | ICD-10-CM | POA: Insufficient documentation

## 2014-03-10 DIAGNOSIS — J45909 Unspecified asthma, uncomplicated: Secondary | ICD-10-CM | POA: Insufficient documentation

## 2014-03-10 DIAGNOSIS — R197 Diarrhea, unspecified: Secondary | ICD-10-CM | POA: Diagnosis not present

## 2014-03-10 DIAGNOSIS — Z8739 Personal history of other diseases of the musculoskeletal system and connective tissue: Secondary | ICD-10-CM | POA: Diagnosis not present

## 2014-03-10 DIAGNOSIS — J111 Influenza due to unidentified influenza virus with other respiratory manifestations: Secondary | ICD-10-CM

## 2014-03-10 LAB — LIPASE, BLOOD: LIPASE: 24 U/L (ref 11–59)

## 2014-03-10 LAB — CBC WITH DIFFERENTIAL/PLATELET
BASOS ABS: 0 10*3/uL (ref 0.0–0.1)
BASOS PCT: 0 % (ref 0–1)
EOS ABS: 0.1 10*3/uL (ref 0.0–0.7)
EOS PCT: 0 % (ref 0–5)
HCT: 49.5 % — ABNORMAL HIGH (ref 36.0–46.0)
Hemoglobin: 16.7 g/dL — ABNORMAL HIGH (ref 12.0–15.0)
Lymphocytes Relative: 9 % — ABNORMAL LOW (ref 12–46)
Lymphs Abs: 1.2 10*3/uL (ref 0.7–4.0)
MCH: 28.9 pg (ref 26.0–34.0)
MCHC: 33.7 g/dL (ref 30.0–36.0)
MCV: 85.6 fL (ref 78.0–100.0)
Monocytes Absolute: 0.6 10*3/uL (ref 0.1–1.0)
Monocytes Relative: 4 % (ref 3–12)
Neutro Abs: 11.6 10*3/uL — ABNORMAL HIGH (ref 1.7–7.7)
Neutrophils Relative %: 87 % — ABNORMAL HIGH (ref 43–77)
PLATELETS: 226 10*3/uL (ref 150–400)
RBC: 5.78 MIL/uL — ABNORMAL HIGH (ref 3.87–5.11)
RDW: 14.8 % (ref 11.5–15.5)
WBC: 13.5 10*3/uL — AB (ref 4.0–10.5)

## 2014-03-10 LAB — COMPREHENSIVE METABOLIC PANEL
ALT: 20 U/L (ref 0–35)
AST: 16 U/L (ref 0–37)
Albumin: 4.3 g/dL (ref 3.5–5.2)
Alkaline Phosphatase: 70 U/L (ref 39–117)
Anion gap: 15 (ref 5–15)
BUN: 9 mg/dL (ref 6–23)
CALCIUM: 9.5 mg/dL (ref 8.4–10.5)
CO2: 24 mEq/L (ref 19–32)
CREATININE: 0.6 mg/dL (ref 0.50–1.10)
Chloride: 96 mEq/L (ref 96–112)
GFR calc Af Amer: >90 mL/min (ref >90)
GFR calc non Af Amer: >90 mL/min (ref >90)
Glucose, Bld: 164 mg/dL — ABNORMAL HIGH (ref 70–99)
Potassium: 4 mEq/L (ref 3.7–5.3)
Sodium: 135 mEq/L — ABNORMAL LOW (ref 137–147)
Total Bilirubin: 1.4 mg/dL — ABNORMAL HIGH (ref 0.3–1.2)
Total Protein: 8.5 g/dL — ABNORMAL HIGH (ref 6.0–8.3)

## 2014-03-10 LAB — I-STAT CG4 LACTIC ACID, ED: LACTIC ACID, VENOUS: 1.24 mmol/L (ref 0.5–2.2)

## 2014-03-10 MED ORDER — SODIUM CHLORIDE 0.9 % IV BOLUS (SEPSIS)
1000.0000 mL | Freq: Once | INTRAVENOUS | Status: AC
  Administered 2014-03-10: 1000 mL via INTRAVENOUS

## 2014-03-10 MED ORDER — ONDANSETRON 4 MG PO TBDP: 4.0000 mg | ORAL_TABLET | Freq: Three times a day (TID) | ORAL | Status: DC | PRN

## 2014-03-10 MED ORDER — OSELTAMIVIR PHOSPHATE 75 MG PO CAPS: 75.0000 mg | ORAL_CAPSULE | Freq: Every day | ORAL | Status: AC

## 2014-03-10 MED ORDER — KETOROLAC TROMETHAMINE 30 MG/ML IJ SOLN
30.0000 mg | Freq: Once | INTRAMUSCULAR | Status: AC
  Administered 2014-03-10: 30 mg via INTRAVENOUS
  Filled 2014-03-10: qty 1

## 2014-03-10 MED ORDER — OSELTAMIVIR PHOSPHATE 75 MG PO CAPS
75.0000 mg | ORAL_CAPSULE | Freq: Once | ORAL | Status: AC
  Administered 2014-03-10: 75 mg via ORAL
  Filled 2014-03-10: qty 1

## 2014-03-10 NOTE — ED Provider Notes (Signed)
CSN: [id number]     Arrival date & time 03/10/14  1659 History  This chart was scribed for Carmin Muskrat, MD by Tula Nakayama, ED Scribe. This patient was seen in room APA12/APA12 and the patient's care was started at 5:14 PM.    Chief Complaint  Patient presents with  . Fever   The history is provided by the patient. No language interpreter was used.    HPI Comments: Ann Lopez is a 43 y.o. female with a history of DM and back pain who presents to the Emergency Department complaining of constant, generalized body aches that started 12 hours. She notes a fever that measured 103, abdominal cramping, diarrhea, chest tightness and decreased energy as associated symptoms. She did not get her flu shot this year. Pt denies a history of CAD, CVA, MI and HTN. She also denies cough, syncope and confusion as associated symptoms.  Past Medical History  Diagnosis Date  . Diabetes mellitus   . Back pain   . Osteopenia   . Allergy   . Asthma   . Cancer   . Breast cancer   . Depression   . Anxiety   . Osteoporosis     osteopenia   Past Surgical History  Procedure Laterality Date  . Mastectomy    . Abdominal hysterectomy    . Lymphadenectomy    . Cesarean section    . Breast surgery    . Incision and drainage abscess Left 11/14/2012    Procedure: INCISION AND DRAINAGE ABSCESS LEFT GROIN;  Surgeon: Jamesetta So, MD;  Location: [facility];  Service: General;  Laterality: Left;   Family History  Problem Relation Age of Onset  . Cancer Mother 50    breast cancer  . Hypertension Sister   . Hypothyroidism Brother   . Cancer Maternal Grandmother 34    breast cancer  . Cancer Maternal Grandfather 55    pancreatic cancer   History  Substance Use Topics  . Smoking status: Former Smoker    Types: Cigarettes    Quit date: 04/21/2007  . Smokeless tobacco: Not on file  . Alcohol Use: No   OB History    No data available     Review of Systems  Constitutional: Positive for fever.        Per HPI, otherwise negative  HENT:       Per HPI, otherwise negative  Respiratory: Positive for chest tightness. Negative for cough.        Per HPI, otherwise negative  Cardiovascular:       Per HPI, otherwise negative  Gastrointestinal: Positive for abdominal pain and diarrhea. Negative for vomiting.  Endocrine:       Negative aside from HPI  Genitourinary:       Neg aside from HPI   Musculoskeletal:       Per HPI, otherwise negative  Skin: Negative.   Neurological: Negative for syncope.  Psychiatric/Behavioral: Negative for confusion.    Allergies  Azithromycin; Morphine and related; Nitrofurantoin; Penicillins; and Sulfonamide derivatives  Home Medications   Prior to Admission medications   Medication Sig Start Date End Date Taking? Authorizing Provider  albuterol (PROVENTIL HFA;VENTOLIN HFA) 108 (90 BASE) MCG/ACT inhaler Inhale 1 puff into the lungs every 6 (six) hours as needed for wheezing or shortness of breath.   Yes Historical Provider, MD  ALPRAZolam Duanne Moron) 1 MG tablet TAKE 1 TABLET (1MG  TOTAL) BY MOUTH AT BEDTIME AS NEEDED FOR SLEEP 10/26/12  Yes Robyn Haber, MD  canagliflozin (INVOKANA) 300 MG TABS tablet Take 300 mg by mouth daily before breakfast.   Yes Historical Provider, MD  clonazePAM (KLONOPIN) 1 MG tablet Take 1 tablet (1 mg total) by mouth 2 (two) times daily as needed for anxiety. 03/08/12  Yes Robyn Haber, MD  desvenlafaxine (PRISTIQ) 100 MG 24 hr tablet Take 1 tablet (100 mg total) by mouth daily. 03/08/12  Yes Robyn Haber, MD  doxycycline (VIBRAMYCIN) 100 MG capsule Take 1 capsule (100 mg total) by mouth 2 (two) times daily. 03/02/14  Yes Gay Filler Copland, MD  HYDROcodone-acetaminophen (NORCO) 7.5-325 MG per tablet Take 1 tablet by mouth 2 (two) times daily as needed for moderate pain. 03/02/14  Yes Gay Filler Copland, MD  lamoTRIgine (LAMICTAL) 100 MG tablet Take 100 mg by mouth daily.   Yes Historical Provider, MD  omeprazole (PRILOSEC) 20 MG  capsule Take 1 capsule (20 mg total) by mouth daily. 08/21/12  Yes Robyn Haber, MD  albuterol (PROVENTIL HFA;VENTOLIN HFA) 108 (90 BASE) MCG/ACT inhaler Inhale 1 puff into the lungs every 6 (six) hours as needed for wheezing. Patient not taking: Reported on 03/10/2014 08/28/12 08/28/13  Robyn Haber, MD  albuterol (PROVENTIL) (2.5 MG/3ML) 0.083% nebulizer solution Take 3 mLs (2.5 mg total) by nebulization every 6 (six) hours as needed for wheezing or shortness of breath. 7/82/95   Delora Fuel, MD  fluticasone [facility]) 50 MCG/ACT nasal spray Place 2 sprays into the nose daily. Patient not taking: Reported on 03/10/2014 03/08/12 03/08/13  Robyn Haber, MD  ketoconazole (NIZORAL) 2 % cream Apply topically daily. 12/01/12   Robyn Haber, MD  Zoledronic Acid (ZOMETA IV) Inject into the vein. Every 6 months    Historical Provider, MD   BP 116/81 mmHg  Pulse 120  Temp(Src) 99.9 F (37.7 C) (Oral)  Resp 17  Ht 5\' 2"  (1.575 m)  Wt 189 lb (85.73 kg)  BMI 34.56 kg/m2  SpO2 97% Physical Exam  Constitutional: She is oriented to person, place, and time. She appears well-developed and well-nourished. No distress.  HENT:  Head: Normocephalic and atraumatic.  Eyes: Conjunctivae and EOM are normal.  Cardiovascular: Normal rate and regular rhythm.   Pulmonary/Chest: Effort normal and breath sounds normal. No stridor. No respiratory distress.  Abdominal: Soft. She exhibits no distension. There is tenderness. There is no rebound and no guarding.  Minimal tender abdomen all over, but not peritoneal  Musculoskeletal: She exhibits no edema.  Neurological: She is alert and oriented to person, place, and time. No cranial nerve deficit.  Skin: Skin is warm and dry.  Psychiatric: She has a normal mood and affect.  Nursing note and vitals reviewed.   ED Course  Procedures (including critical care time)   COORDINATION OF CARE: 5:21 PM Discussed treatment plan with pt which includes lab work. Pt  agreed to plan.  7:14 PM Discussed unremarkable test results with pt and will treat for flu symptoms. Pt agreed to plan.  Labs Review Labs Reviewed  COMPREHENSIVE METABOLIC PANEL - Abnormal; Notable for the following:    Sodium 135 (*)    Glucose, Bld 164 (*)    Total Protein 8.5 (*)    Total Bilirubin 1.4 (*)    All other components within normal limits  CBC WITH DIFFERENTIAL - Abnormal; Notable for the following:    WBC 13.5 (*)    RBC 5.78 (*)    Hemoglobin 16.7 (*)    HCT 49.5 (*)    Neutrophils Relative % 87 (*)  Neutro Abs 11.6 (*)    Lymphocytes Relative 9 (*)    All other components within normal limits  LIPASE, BLOOD  I-STAT CG4 LACTIC ACID, ED    Imaging Review Dg Chest 2 View  03/10/2014   CLINICAL DATA:  Fever, body aches  EXAM: CHEST  2 VIEW  COMPARISON:  06/26/2013  FINDINGS: Cardiomediastinal silhouette is stable. No acute infiltrate or pleural effusion. No pulmonary edema. Bony thorax is unremarkable.  IMPRESSION: No active cardiopulmonary disease.   Electronically Signed   By: Lahoma Crocker M.D.   On: 03/10/2014 18:12   7:34 PM On re-exam the patient is awake and alert, appropriately interactive. She continues to c/o diffuse myalgia. We discussed all findings, return precautions, follow-up instructions.  MDM  She presents with multiple concerns, but most notably, myalgia, nausea, anorexia, diarrhea, congestion. No evidence for pneumonia, and labs are largely reassuring, though there is mild leukocytosis. Given the patient's diffuse myalgia, influenza-like illness, patient was treated empirically for influenza. No evidence for sepsis, bacteremia, coronary ischemia, or other acute new pathology, the reassuring lactic acid level, and the absence of substantial hemodynamically instability.   I personally performed the services described in this documentation, which was scribed in my presence. The recorded information has been reviewed and is  accurate.       Carmin Muskrat, MD 03/10/14 [phone]

## 2014-03-10 NOTE — ED Notes (Signed)
Pt co fever and body aches since 0300 this morning, last motrin was at 1300 today.

## 2014-03-10 NOTE — Discharge Instructions (Signed)
As discussed, you are being treated for possible influenza.  Please be sure to take all medication as directed, and follow up with her primary care physician.  Return here for concerning changes in your condition.

## 2014-03-24 ENCOUNTER — Ambulatory Visit (INDEPENDENT_AMBULATORY_CARE_PROVIDER_SITE_OTHER): Payer: 59 | Admitting: Family Medicine

## 2014-03-24 VITALS — BP 112/70 | HR 87 | Temp 98.0°F | Resp 20 | Ht 62.0 in | Wt 190.4 lb

## 2014-03-24 DIAGNOSIS — M5489 Other dorsalgia: Secondary | ICD-10-CM

## 2014-03-24 DIAGNOSIS — E119 Type 2 diabetes mellitus without complications: Secondary | ICD-10-CM

## 2014-03-24 DIAGNOSIS — K4190 Unilateral femoral hernia, without obstruction or gangrene, not specified as recurrent: Secondary | ICD-10-CM

## 2014-03-24 DIAGNOSIS — E785 Hyperlipidemia, unspecified: Secondary | ICD-10-CM

## 2014-03-24 DIAGNOSIS — J4520 Mild intermittent asthma, uncomplicated: Secondary | ICD-10-CM

## 2014-03-24 DIAGNOSIS — F410 Panic disorder [episodic paroxysmal anxiety] without agoraphobia: Secondary | ICD-10-CM

## 2014-03-24 DIAGNOSIS — F341 Dysthymic disorder: Secondary | ICD-10-CM

## 2014-03-24 LAB — POCT GLYCOSYLATED HEMOGLOBIN (HGB A1C): Hemoglobin A1C: 7.3

## 2014-03-24 MED ORDER — ALBUTEROL SULFATE HFA 108 (90 BASE) MCG/ACT IN AERS: 1.0000 | INHALATION_SPRAY | Freq: Four times a day (QID) | RESPIRATORY_TRACT | Status: DC | PRN

## 2014-03-24 MED ORDER — CLONAZEPAM 1 MG PO TABS: 1.0000 mg | ORAL_TABLET | Freq: Two times a day (BID) | ORAL | Status: DC | PRN

## 2014-03-24 MED ORDER — CANAGLIFLOZIN 300 MG PO TABS: 300.0000 mg | ORAL_TABLET | Freq: Every day | ORAL | Status: DC

## 2014-03-24 MED ORDER — FENOFIBRATE 145 MG PO TABS: 145.0000 mg | ORAL_TABLET | Freq: Every day | ORAL | Status: DC

## 2014-03-24 MED ORDER — DESVENLAFAXINE SUCCINATE ER 100 MG PO TB24: 100.0000 mg | ORAL_TABLET | Freq: Every day | ORAL | Status: DC

## 2014-03-24 MED ORDER — HYDROCODONE-ACETAMINOPHEN 7.5-325 MG PO TABS: 1.0000 | ORAL_TABLET | Freq: Three times a day (TID) | ORAL | Status: DC | PRN

## 2014-03-24 MED ORDER — ALPRAZOLAM 1 MG PO TABS: ORAL_TABLET | ORAL | Status: DC

## 2014-03-24 NOTE — Patient Instructions (Addendum)
Recheck 3 months  IMPRESSION: 1.  Mild degenerative disc disease and facet arthrosis as described above. No significant nerve root encroachment. 2.  No evidence of metastatic disease.   Result Narrative  INDICATION: 724.2: Lumbago 338.29: Other chronic pain.  STUDY: MRI of the lumbar spine without and with intravenous contrast performed on 08/21/2013 9:58 AM. No comparison available.  TECHNIQUE: Multiplanar, multisequence MR imaging obtained through the lumbar spine without and with contrast on 08/21/2013 9:58 AM.  Contrast: 18 mL of MultiHance was utilized for this examination.  FINDINGS: #  Osseous structures: Vertebral body heights are maintained. No acute fracture or concerning marrow signal abnormality. #  Alignment:Alignment maintained. #  Conus medullaris/cauda equina: Spinal cord signal and termination are within normal limits.  #  Lower thoracic spine: No significant spinal canal or foraminal stenosis.  #  T12-L1: No significant disc herniation, spinal canal, or neuroforaminal compromise. #  L1-L2: No significant disc herniation, spinal canal, or neural foraminal compromise. #  L2-L3: No significant disc herniation, spinal canal, or neural foraminal compromise. #  L3-L4: Mild disc desiccation and mild loss of disc height without a significant disc herniation, spinal canal or neural foraminal compromise. #  L4-L5: No significant disc herniation, spinal canal or neuroforaminal compromise. There is minimal facet hypertrophy with fluid present in the RIGHT facet joint. #  L5-S1: No significant disc herniation, spinal canal or neuroforaminal compromise.  #  Paraspinal tissues: Unremarkable.  #  Additional comments: No abnormal enhancement.

## 2014-03-24 NOTE — Progress Notes (Signed)
44 year old woman is here for refill on her medication. She has chronic back pain without significant abnormalities on imaging. She's been on the medicine for a long time.  Patient notes no change in sensation or motor function in the lower extremities.  Patient denies chest pain, shortness of breath, dizziness, headache.  Objective: No acute distress Obese woman with good range of motion of legs and arms, normal gait Chest: Clear HEENT: Unremarkable Heart: Regular no murmur Abdomen: Soft, no HSM, no mass palpated although obesity hinders exam Skin: Unremarkable feet with good pedal pulses, normal sensation in the feet Extremities: No edema      ICD-9-CM ICD-10-CM   1. Other back pain 724.5 M54.89 HYDROcodone-acetaminophen (NORCO) 7.5-325 MG per tablet  2. Panic anxiety syndrome 300.01 F41.0 clonazePAM (KLONOPIN) 1 MG tablet  3. DEPRESSION/ANXIETY 300.4 F34.1 desvenlafaxine (PRISTIQ) 100 MG 24 hr tablet     ALPRAZolam (XANAX) 1 MG tablet  4. Type 2 diabetes mellitus without complication 975.30 Y51.1 canagliflozin (INVOKANA) 300 MG TABS tablet     POCT glycosylated hemoglobin (Hb A1C)     Lipid panel     Microalbumin, urine  5. Asthma, chronic, mild intermittent, uncomplicated 021.11 N35.67 albuterol (PROVENTIL HFA;VENTOLIN HFA) 108 (90 BASE) MCG/ACT inhaler  6. Hyperlipidemia 272.4 E78.5 fenofibrate (TRICOR) 145 MG tablet  7. Unilateral femoral hernia without obstruction or gangrene, recurrence not specified 553.00 K41.90      Signed, Robyn Haber, MD

## 2014-03-25 LAB — LIPID PANEL
Cholesterol: 162 mg/dL (ref 0–200)
HDL: 31 mg/dL — ABNORMAL LOW (ref >39)
LDL Cholesterol: 61 mg/dL (ref 0–99)
Total CHOL/HDL Ratio: 5.2 Ratio
Triglycerides: 348 mg/dL — ABNORMAL HIGH (ref <150)
VLDL: 70 mg/dL — ABNORMAL HIGH (ref 0–40)

## 2014-03-25 LAB — MICROALBUMIN, URINE: Microalb, Ur: 0.5 mg/dL (ref <2.0)

## 2014-05-09 ENCOUNTER — Ambulatory Visit (INDEPENDENT_AMBULATORY_CARE_PROVIDER_SITE_OTHER): Payer: Medicare Other | Admitting: Family Medicine

## 2014-05-09 VITALS — BP 100/67 | HR 80 | Temp 98.3°F | Resp 24 | Ht 62.0 in | Wt 192.0 lb

## 2014-05-09 DIAGNOSIS — E781 Pure hyperglyceridemia: Secondary | ICD-10-CM

## 2014-05-09 DIAGNOSIS — J0101 Acute recurrent maxillary sinusitis: Secondary | ICD-10-CM | POA: Diagnosis not present

## 2014-05-09 DIAGNOSIS — M5489 Other dorsalgia: Secondary | ICD-10-CM | POA: Diagnosis not present

## 2014-05-09 MED ORDER — HYDROCODONE-ACETAMINOPHEN 7.5-325 MG PO TABS: 1.0000 | ORAL_TABLET | Freq: Three times a day (TID) | ORAL | Status: DC | PRN

## 2014-05-09 MED ORDER — CHOLINE FENOFIBRATE 135 MG PO CPDR: 135.0000 mg | DELAYED_RELEASE_CAPSULE | Freq: Every day | ORAL | Status: DC

## 2014-05-09 MED ORDER — LEVOFLOXACIN 500 MG PO TABS: 500.0000 mg | ORAL_TABLET | Freq: Every day | ORAL | Status: DC

## 2014-05-09 NOTE — Patient Instructions (Signed)
Results for orders placed or performed in visit on 03/24/14  Lipid panel  Result Value Ref Range   Cholesterol 162 0 - 200 mg/dL   Triglycerides 348 (H) <150 mg/dL   HDL 31 (L) >39 mg/dL   Total CHOL/HDL Ratio 5.2 Ratio   VLDL 70 (H) 0 - 40 mg/dL   LDL Cholesterol 61 0 - 99 mg/dL  Microalbumin, urine  Result Value Ref Range   Microalb, Ur 0.5 <2.0 mg/dL  POCT glycosylated hemoglobin (Hb A1C)  Result Value Ref Range   Hemoglobin A1C 7.3

## 2014-05-09 NOTE — Progress Notes (Signed)
° °  Subjective:    Patient ID: Ann Lopez, female    DOB: [birth year], 44 y.o.   MRN: [id number] This chart was scribed for Robyn Haber, MD by Zola Button, Medical Scribe. This patient was seen in Room 11 and the patient's care was started at 4:11 PM.   HPI HPI Comments: Ann Lopez is a 44 y.o. female with a hx of DM, HTN, depression/anxiety and breast cancer who presents to the [facility] complaining of sinus pressure that started 1 month ago. She reports having some mild cough. Patient has tried several OTC treatments including Flonase without relief. She denies fever and epistaxis. Patient states she is able to tolerate Levaquin. She also requests a refill for her pain medication.  Patient is on disabilities and does not currently work. She is disabled following cancer because of chronic pain and anxiety.  Allergies  Allergen Reactions   Azithromycin     Unknown   Morphine And Related     Can tolerate hydrocodone and dilaudid without side effects   Nitrofurantoin     Unknown   Penicillins Hives and Itching   Sulfonamide Derivatives Hives    Review of Systems  Constitutional: Negative for fever.  HENT: Positive for sinus pressure. Negative for nosebleeds.   Respiratory: Positive for cough.        Objective:   Physical Exam CONSTITUTIONAL: Well developed/well nourished HEAD: Normocephalic/atraumatic EYES: EOM/PERRL ENMT: Mucous membranes moist NECK: supple no meningeal signs SPINE: entire spine nontender CV: S1/S2 noted, no murmurs/rubs/gallops noted LUNGS: Lungs are clear to auscultation bilaterally, no apparent distress ABDOMEN: soft, nontender, no rebound or guarding GU: no cva tenderness NEURO: Pt is awake/alert, moves all extremitiesx4 EXTREMITIES: pulses normal, full ROM SKIN: warm, color normal PSYCH: no abnormalities of mood noted   Results for orders placed or performed in visit on 03/24/14  Lipid panel  Result Value Ref  Range   Cholesterol 162 0 - 200 mg/dL   Triglycerides 348 (H) <150 mg/dL   HDL 31 (L) >39 mg/dL   Total CHOL/HDL Ratio 5.2 Ratio   VLDL 70 (H) 0 - 40 mg/dL   LDL Cholesterol 61 0 - 99 mg/dL  Microalbumin, urine  Result Value Ref Range   Microalb, Ur 0.5 <2.0 mg/dL  POCT glycosylated hemoglobin (Hb A1C)  Result Value Ref Range   Hemoglobin A1C 7.3         Assessment & Plan:   This chart was scribed in my presence and reviewed by me personally.    ICD-9-CM ICD-10-CM   1. Other back pain 724.5 M54.89 HYDROcodone-acetaminophen (NORCO) 7.5-325 MG per tablet  2. Acute recurrent maxillary sinusitis 461.0 J01.01 levofloxacin (LEVAQUIN) 500 MG tablet  3. Hypertriglyceridemia 272.1 E78.1 Choline Fenofibrate 135 MG capsule     Signed, Robyn Haber, MD

## 2014-06-02 DIAGNOSIS — M8580 Other specified disorders of bone density and structure, unspecified site: Secondary | ICD-10-CM | POA: Diagnosis not present

## 2014-06-09 ENCOUNTER — Ambulatory Visit (INDEPENDENT_AMBULATORY_CARE_PROVIDER_SITE_OTHER): Payer: Medicare Other | Admitting: Family Medicine

## 2014-06-09 ENCOUNTER — Other Ambulatory Visit: Payer: Self-pay

## 2014-06-09 VITALS — BP 100/70 | HR 67 | Temp 98.0°F | Ht 61.75 in | Wt 190.5 lb

## 2014-06-09 DIAGNOSIS — M5489 Other dorsalgia: Secondary | ICD-10-CM | POA: Diagnosis not present

## 2014-06-09 DIAGNOSIS — J0100 Acute maxillary sinusitis, unspecified: Secondary | ICD-10-CM

## 2014-06-09 MED ORDER — LEVOFLOXACIN 750 MG PO TABS: 750.0000 mg | ORAL_TABLET | Freq: Every day | ORAL | Status: DC

## 2014-06-09 MED ORDER — HYDROCODONE-ACETAMINOPHEN 7.5-325 MG PO TABS
1.0000 | ORAL_TABLET | Freq: Three times a day (TID) | ORAL | Status: DC | PRN
Start: 2014-06-09 — End: 2014-07-09

## 2014-06-09 NOTE — Progress Notes (Signed)
@UMFCLOGO @  This chart was scribed for Robyn Haber, MD by Hilda Lias, ED Scribe. The patient's care was started at 7:57 PM.   Patient ID: Ann Lopez MRN: [id number], DOB: 22-Dec-1970, 44 y.o. Date of Encounter: 06/09/2014, 7:56 PM  Primary Physician: Hayden Rasmussen., MD  Chief Complaint:  Chief Complaint  Patient presents with   URI    C/O hoarseness, sore throat, cough, head congestion, headache, blowing out yellow, & PND x 1 month. Completed Levaquin early February.     HPI: 44 y.o. year old female with history below presents with a sore throat with associated right ear pain that has been present for about 6 weeks. . She seemed to be better for a few days and then the sinus congestion returned to its pretreatment level  She's disabled with back pain. She's reluctant to go back to her pain clinic because they want to inject her back and she is unwilling to have them stick needles in her back.   Past Medical History  Diagnosis Date   Diabetes mellitus    Back pain    Osteopenia    Allergy    Asthma    Cancer    Breast cancer    Depression    Anxiety    Osteoporosis     osteopenia     Home Meds: Prior to Admission medications   Medication Sig Start Date End Date Taking? Authorizing Provider  albuterol (PROVENTIL HFA;VENTOLIN HFA) 108 (90 BASE) MCG/ACT inhaler Inhale 1 puff into the lungs every 6 (six) hours as needed for wheezing or shortness of breath. 03/24/14  Yes Robyn Haber, MD  albuterol (PROVENTIL) (2.5 MG/3ML) 0.083% nebulizer solution Take 3 mLs (2.5 mg total) by nebulization every 6 (six) hours as needed for wheezing or shortness of breath. 3/47/42  Yes Delora Fuel, MD  ALPRAZolam Duanne Moron) 1 MG tablet TAKE 1 TABLET (1MG  TOTAL) BY MOUTH AT BEDTIME AS NEEDED FOR SLEEP 03/24/14  Yes Robyn Haber, MD  canagliflozin [facility]) 300 MG TABS tablet Take 300 mg by mouth daily before breakfast. 03/24/14  Yes Robyn Haber, MD  Choline  Fenofibrate 135 MG capsule Take 1 capsule (135 mg total) by mouth daily. 05/09/14  Yes Robyn Haber, MD  clonazePAM (KLONOPIN) 1 MG tablet Take 1 tablet (1 mg total) by mouth 2 (two) times daily as needed for anxiety. 03/24/14  Yes Robyn Haber, MD  desvenlafaxine (PRISTIQ) 100 MG 24 hr tablet Take 1 tablet (100 mg total) by mouth daily. 03/24/14  Yes Robyn Haber, MD  HYDROcodone-acetaminophen (NORCO) 7.5-325 MG per tablet Take 1 tablet by mouth every 8 (eight) hours as needed for moderate pain. 05/09/14  Yes Robyn Haber, MD  omeprazole (PRILOSEC) 20 MG capsule Take 1 capsule (20 mg total) by mouth daily. 08/21/12  Yes Robyn Haber, MD  Zoledronic Acid (ZOMETA IV) Inject into the vein. Every 6 months   Yes Historical Provider, MD    Allergies:  Allergies  Allergen Reactions   Azithromycin     Unknown   Morphine And Related     Can tolerate hydrocodone and dilaudid without side effects   Nitrofurantoin     Unknown   Penicillins Hives and Itching   Sulfonamide Derivatives Hives    History   Social History   Marital Status: Single    Spouse Name: N/A   Number of Children: N/A   Years of Education: N/A   Occupational History   Not on file.   Social History Main Topics   Smoking  status: Former Smoker    Types: Cigarettes    Quit date: 04/21/2007   Smokeless tobacco: Not on file   Alcohol Use: No   Drug Use: No   Sexual Activity: Not Currently   Other Topics Concern   Not on file   Social History Narrative     Review of Systems: Constitutional: negative for chills, fever, night sweats, weight changes, or fatigue  HEENT: negative for vision changes, hearing loss, congestion, rhinorrhea, ST, epistaxis, or sinus pressure, positive sore throat, positive ear pain Cardiovascular: negative for chest pain or palpitations Respiratory: negative for hemoptysis, wheezing, shortness of breath, or cough Abdominal: negative for abdominal pain, nausea,  vomiting, diarrhea, or constipation Dermatological: negative for rash Neurologic: negative for headache, dizziness, or syncope All other systems reviewed and are otherwise negative with the exception to those above and in the HPI.   Physical Exam: Blood pressure 100/70, pulse 67, temperature 98 F (36.7 C), temperature source Oral, height 5' 1.75" (1.568 m), weight 190 lb 8 oz (86.41 kg), SpO2 97 %., Body mass index is 35.15 kg/(m^2). General: Well developed, well nourished, in no acute distress. Head: Normocephalic, atraumatic, eyes without discharge, sclera non-icteric, nares are without discharge. Bilateral auditory canals clear, TM's are without perforation, pearly grey and translucent with reflective cone of light bilaterally. Oral cavity moist, posterior pharynx without exudate, erythema, peritonsillar abscess. Positive post-nasal drainage  Neck: Supple. No thyromegaly. Full ROM. No lymphadenopathy. Lungs: Clear bilaterally to auscultation without wheezes, rales, or rhonchi. Breathing is unlabored. Heart: RRR with S1 S2. No murmurs, rubs, or gallops appreciated. Abdomen: Soft, non-tender, non-distended with normoactive bowel sounds. No hepatomegaly. No rebound/guarding. No obvious abdominal masses. Msk:  Strength and tone normal for age. Extremities/Skin: Warm and dry. No clubbing or cyanosis. No edema. No rashes or suspicious lesions. Neuro: Alert and oriented X 3. Moves all extremities spontaneously. Gait is normal. CNII-XII grossly in tact. Psych:  Responds to questions appropriately with a normal affect.   Labs:   ASSESSMENT AND PLAN:  44 y.o. year old female with sinusitis and back pain  This chart was scribed in my presence and reviewed by me personally.    ICD-9-CM ICD-10-CM   1. Other back pain 724.5 M54.89 HYDROcodone-acetaminophen (NORCO) 7.5-325 MG per tablet  2. Acute maxillary sinusitis, recurrence not specified 461.0 J01.00 levofloxacin (LEVAQUIN) 750 MG tablet      Signed, Robyn Haber, MD

## 2014-06-09 NOTE — Telephone Encounter (Signed)
Pt requesting hydrocodone refill   Best phone [phone]

## 2014-06-09 NOTE — Patient Instructions (Signed)

## 2014-06-21 ENCOUNTER — Other Ambulatory Visit: Payer: Self-pay | Admitting: Family Medicine

## 2014-07-08 NOTE — Telephone Encounter (Signed)
Pt requesting refill on HYDROcodone-acetaminophen (NORCO) 7.5-325 MG per tablet [[id number]]

## 2014-07-09 MED ORDER — HYDROCODONE-ACETAMINOPHEN 7.5-325 MG PO TABS: 1.0000 | ORAL_TABLET | Freq: Three times a day (TID) | ORAL | Status: DC | PRN

## 2014-07-09 NOTE — Telephone Encounter (Signed)
Pt got a Rx on 06/09/14. This is a new request for April.

## 2014-07-12 ENCOUNTER — Telehealth: Payer: Self-pay

## 2014-07-12 NOTE — Telephone Encounter (Signed)
Called pt, advised Rx ready to pick up. Hydrocodone.

## 2014-07-14 NOTE — Telephone Encounter (Signed)
Pt has picked up already.

## 2014-08-08 ENCOUNTER — Telehealth: Payer: Self-pay

## 2014-08-08 NOTE — Telephone Encounter (Signed)
The patient called to request refill of Norco 7.5/325mg .  The patient may be reached at [phone].

## 2014-08-10 NOTE — Telephone Encounter (Signed)
While I would like to oblige patient with refills, federal guidelines require patient to follow up with pain specialist for ongoing narcotic prescriptions.  We can refer her to a different pain clinic or orthopedist if she would like.

## 2014-08-10 NOTE — Telephone Encounter (Signed)
Gave pt message. She would like for you to refer her. She wants to know what she is supposed to do in the meantime regarding her Norco.

## 2014-08-10 NOTE — Telephone Encounter (Signed)
Patient needs to try a variety of pain relieving strategies:  Hot baths, massage, gentle ROM, ice packs. Referral pending

## 2014-08-11 NOTE — Telephone Encounter (Signed)
lmom of below message. 

## 2014-09-04 ENCOUNTER — Other Ambulatory Visit: Payer: Self-pay | Admitting: Family Medicine

## 2014-09-04 DIAGNOSIS — G894 Chronic pain syndrome: Secondary | ICD-10-CM

## 2014-09-04 NOTE — Telephone Encounter (Signed)
Patient called checking status of referral to pain clinic. She is in extreme pain and can't do anything. She wasn't given anything for the pain. She's tried hot baths and other things but they just don't work. Referral was supposed to be placed about 3 weeks ago and she has not heard anything. Dr. Pauletta Browns note below says referral pending, but I don't see an order for referral. Can we get a referral set up? She is very upset that she hasn't heard anything in 3 weeks. Cb# [phone]

## 2014-09-04 NOTE — Telephone Encounter (Signed)
Can you please place referral and advise as to what pt can do in the meantime. Thanks

## 2014-09-05 ENCOUNTER — Encounter (HOSPITAL_COMMUNITY): Payer: Self-pay

## 2014-09-05 ENCOUNTER — Emergency Department (HOSPITAL_COMMUNITY)
Admission: EM | Admit: 2014-09-05 | Discharge: 2014-09-06 | Disposition: A | Discharge status: Home / Self Care | Payer: Medicare Other | Attending: Emergency Medicine | Admitting: Emergency Medicine

## 2014-09-05 DIAGNOSIS — M549 Dorsalgia, unspecified: Secondary | ICD-10-CM

## 2014-09-05 DIAGNOSIS — Z792 Long term (current) use of antibiotics: Secondary | ICD-10-CM | POA: Diagnosis not present

## 2014-09-05 DIAGNOSIS — Z79899 Other long term (current) drug therapy: Secondary | ICD-10-CM | POA: Insufficient documentation

## 2014-09-05 DIAGNOSIS — Z880 Allergy status to penicillin: Secondary | ICD-10-CM | POA: Insufficient documentation

## 2014-09-05 DIAGNOSIS — F419 Anxiety disorder, unspecified: Secondary | ICD-10-CM | POA: Insufficient documentation

## 2014-09-05 DIAGNOSIS — M62830 Muscle spasm of back: Secondary | ICD-10-CM | POA: Diagnosis not present

## 2014-09-05 DIAGNOSIS — M810 Age-related osteoporosis without current pathological fracture: Secondary | ICD-10-CM | POA: Diagnosis not present

## 2014-09-05 DIAGNOSIS — Z853 Personal history of malignant neoplasm of breast: Secondary | ICD-10-CM | POA: Insufficient documentation

## 2014-09-05 DIAGNOSIS — F329 Major depressive disorder, single episode, unspecified: Secondary | ICD-10-CM | POA: Insufficient documentation

## 2014-09-05 DIAGNOSIS — M545 Low back pain: Secondary | ICD-10-CM | POA: Insufficient documentation

## 2014-09-05 DIAGNOSIS — E119 Type 2 diabetes mellitus without complications: Secondary | ICD-10-CM | POA: Insufficient documentation

## 2014-09-05 DIAGNOSIS — J45909 Unspecified asthma, uncomplicated: Secondary | ICD-10-CM | POA: Insufficient documentation

## 2014-09-05 DIAGNOSIS — Z87891 Personal history of nicotine dependence: Secondary | ICD-10-CM | POA: Insufficient documentation

## 2014-09-05 DIAGNOSIS — G8929 Other chronic pain: Secondary | ICD-10-CM | POA: Diagnosis not present

## 2014-09-05 NOTE — ED Notes (Signed)
Patient states that she is having back pain, in her lower back. Started about 3 weeks ago, yesterday it got worse.

## 2014-09-05 NOTE — ED Provider Notes (Signed)
CSN: [id number]     Arrival date & time 09/05/14  2157 History   First MD Initiated Contact with Patient 09/05/14 2322     Chief Complaint  Patient presents with  . Back Pain     (Consider location/radiation/quality/duration/timing/severity/associated sxs/prior Treatment) HPI Ann Lopez is a 44 y.o. female with hx of chronic back pain presents to the ED tonight with low back pain. She reports that the pain started about 3 weeks ago and yesterday got worse. Patient has been seeing Dr. Patterson Hammersmith and has been getting Norco 7.5/325 mg. 90 tablets. On her last visit he told her she needs to try other pain management such as hot baths and relaxation and go to pain management. Appointment pending. Patient called back to the office yesterday and per note in the chart Dr. Patterson Hammersmith noted that while he would like to oblige her with refills that federal guidelines require patient's with chronic pain needing ongoing pain management be treated in a pain management clinic.     Past Medical History  Diagnosis Date  . Diabetes mellitus   . Back pain   . Osteopenia   . Allergy   . Asthma   . Cancer   . Breast cancer   . Depression   . Anxiety   . Osteoporosis     osteopenia   Past Surgical History  Procedure Laterality Date  . Mastectomy    . Abdominal hysterectomy    . Lymphadenectomy    . Cesarean section    . Breast surgery    . Incision and drainage abscess Left 11/14/2012    Procedure: INCISION AND DRAINAGE ABSCESS LEFT GROIN;  Surgeon: Jamesetta So, MD;  Location: [facility];  Service: General;  Laterality: Left;   Family History  Problem Relation Age of Onset  . Cancer Mother 72    breast cancer  . Hypertension Sister   . Hypothyroidism Brother   . Cancer Maternal Grandmother 41    breast cancer  . Cancer Maternal Grandfather 55    pancreatic cancer   History  Substance Use Topics  . Smoking status: Former Smoker    Types: Cigarettes    Quit date: 04/21/2007  . Smokeless  tobacco: Not on file  . Alcohol Use: No   OB History    No data available     Review of Systems  Musculoskeletal: Positive for back pain.  all other systems negative    Allergies  Azithromycin; Morphine and related; Nitrofurantoin; Penicillins; and Sulfonamide derivatives  Home Medications   Prior to Admission medications   Medication Sig Start Date End Date Taking? Authorizing Provider  albuterol (PROVENTIL HFA;VENTOLIN HFA) 108 (90 BASE) MCG/ACT inhaler Inhale 1 puff into the lungs every 6 (six) hours as needed for wheezing or shortness of breath. 03/24/14   Robyn Haber, MD  albuterol (PROVENTIL) (2.5 MG/3ML) 0.083% nebulizer solution Take 3 mLs (2.5 mg total) by nebulization every 6 (six) hours as needed for wheezing or shortness of breath. 0/17/51   Delora Fuel, MD  ALPRAZolam Duanne Moron) 1 MG tablet TAKE 1 TABLET (1MG  TOTAL) BY MOUTH AT BEDTIME AS NEEDED FOR SLEEP 03/24/14   Robyn Haber, MD  canagliflozin [facility]) 300 MG TABS tablet Take 300 mg by mouth daily before breakfast. 03/24/14   Robyn Haber, MD  Choline Fenofibrate 135 MG capsule Take 1 capsule (135 mg total) by mouth daily. 05/09/14   Robyn Haber, MD  clonazePAM (KLONOPIN) 1 MG tablet Take 1 tablet (1 mg total) by mouth 2 (  two) times daily as needed for anxiety. 03/24/14   Robyn Haber, MD  cyclobenzaprine (FLEXERIL) 10 MG tablet Take 1 tablet (10 mg total) by mouth 2 (two) times daily as needed for muscle spasms. 09/06/14   Naven Giambalvo Bunnie Pion, NP  desvenlafaxine (PRISTIQ) 100 MG 24 hr tablet Take 1 tablet (100 mg total) by mouth daily. 03/24/14   Robyn Haber, MD  HYDROcodone-acetaminophen (NORCO) 7.5-325 MG per tablet Take 1 tablet by mouth every 8 (eight) hours as needed for moderate pain. 07/09/14   Robyn Haber, MD  levofloxacin (LEVAQUIN) 750 MG tablet Take 1 tablet (750 mg total) by mouth daily. 06/09/14   Robyn Haber, MD  omeprazole (PRILOSEC) 20 MG capsule Take 1 capsule (20 mg total) by mouth  daily. 08/21/12   Robyn Haber, MD  Zoledronic Acid (ZOMETA IV) Inject into the vein. Every 6 months    Historical Provider, MD   BP 89/51 mmHg  Pulse 64  Temp(Src) 98.3 F (36.8 C) (Oral)  Resp 20  SpO2 97% Physical Exam  Constitutional: She is oriented to person, place, and time. She appears well-developed and well-nourished. No distress.  HENT:  Head: Normocephalic and atraumatic.  Right Ear: Tympanic membrane normal.  Left Ear: Tympanic membrane normal.  Nose: Nose normal.  Mouth/Throat: Uvula is midline, oropharynx is clear and moist and mucous membranes are normal.  Eyes: EOM are normal.  Neck: Normal range of motion. Neck supple.  Cardiovascular: Normal rate and regular rhythm.   Pulmonary/Chest: Effort normal. She has no wheezes. She has no rales.  Abdominal: Soft. Bowel sounds are normal. There is no tenderness.  Musculoskeletal: Normal range of motion.       Lumbar back: She exhibits tenderness, pain and spasm. She exhibits normal pulse.  Neurological: She is alert and oriented to person, place, and time. She has normal strength. No cranial nerve deficit or sensory deficit. Gait normal.  Reflex Scores:      Bicep reflexes are 2+ on the right side and 2+ on the left side.      Brachioradialis reflexes are 2+ on the right side and 2+ on the left side.      Patellar reflexes are 2+ on the right side and 2+ on the left side.      Achilles reflexes are 2+ on the right side and 2+ on the left side. Skin: Skin is warm and dry.  Psychiatric: She has a normal mood and affect. Her behavior is normal.  Nursing note and vitals reviewed.   ED Course  Procedures  MDM  44 y.o. female with chronic pain and has been getting her medications from Dr. Joseph Art is requesting pain management because he has told her she will need to be in a pain management clinic for her narcotics. She has been taking Hydrocodone 7.5 mg and gets 90 at a time. She reports that they are no longer helping  her pain and she feels she needs something stronger. I discussed with the patient that she will need to return to her PCP to discuss further pain management. Explained that chronic pain is not managed in the ED. Patient voices understanding.   Final diagnoses:  Chronic back pain      [facility], NP 09/07/14 [place], MD 09/08/14 [phone]

## 2014-09-06 DIAGNOSIS — M545 Low back pain: Secondary | ICD-10-CM | POA: Diagnosis not present

## 2014-09-06 MED ORDER — KETOROLAC TROMETHAMINE 60 MG/2ML IM SOLN
30.0000 mg | Freq: Once | INTRAMUSCULAR | Status: AC
  Administered 2014-09-06: 30 mg via INTRAMUSCULAR
  Filled 2014-09-06: qty 2

## 2014-09-06 MED ORDER — CYCLOBENZAPRINE HCL 10 MG PO TABS
10.0000 mg | ORAL_TABLET | Freq: Two times a day (BID) | ORAL | Status: DC | PRN
Start: 2014-09-06 — End: 2014-09-19

## 2014-09-06 MED ORDER — CYCLOBENZAPRINE HCL 10 MG PO TABS
10.0000 mg | ORAL_TABLET | Freq: Once | ORAL | Status: AC
  Administered 2014-09-06: 10 mg via ORAL
  Filled 2014-09-06: qty 1

## 2014-09-06 NOTE — Discharge Instructions (Signed)
Your will need to call your doctor's office tomorrow to discuss pain management until you can get into a pain clinic.

## 2014-09-10 ENCOUNTER — Telehealth: Payer: Self-pay | Admitting: *Deleted

## 2014-09-10 NOTE — Telephone Encounter (Signed)
Per Dr. Pauletta Browns request, I phoned patient (and talked for 45 minutes).  She is still in a lot of pain (NOTHING seems to be working), is still immobile secondary to the pain and while she would prefer to see [facility], with the amount of pain she is in, she can't wait that long (not back until the 10th, per schedule).  She intends to be here Monday to see J. Copland at the walk-in clinic.

## 2014-09-19 ENCOUNTER — Ambulatory Visit (INDEPENDENT_AMBULATORY_CARE_PROVIDER_SITE_OTHER): Payer: Medicare Other | Admitting: Family Medicine

## 2014-09-19 VITALS — BP 140/82 | HR 86 | Temp 98.1°F | Resp 18 | Ht 61.0 in | Wt 190.0 lb

## 2014-09-19 DIAGNOSIS — K219 Gastro-esophageal reflux disease without esophagitis: Secondary | ICD-10-CM | POA: Diagnosis not present

## 2014-09-19 DIAGNOSIS — E119 Type 2 diabetes mellitus without complications: Secondary | ICD-10-CM | POA: Diagnosis not present

## 2014-09-19 DIAGNOSIS — J4520 Mild intermittent asthma, uncomplicated: Secondary | ICD-10-CM

## 2014-09-19 DIAGNOSIS — F341 Dysthymic disorder: Secondary | ICD-10-CM | POA: Diagnosis not present

## 2014-09-19 DIAGNOSIS — M5489 Other dorsalgia: Secondary | ICD-10-CM | POA: Diagnosis not present

## 2014-09-19 DIAGNOSIS — F410 Panic disorder [episodic paroxysmal anxiety] without agoraphobia: Secondary | ICD-10-CM | POA: Diagnosis not present

## 2014-09-19 LAB — COMPLETE METABOLIC PANEL WITH GFR
ALT: 15 U/L (ref 0–35)
AST: 12 U/L (ref 0–37)
Albumin: 4.4 g/dL (ref 3.5–5.2)
Alkaline Phosphatase: 69 U/L (ref 39–117)
BUN: 10 mg/dL (ref 6–23)
CO2: 26 mEq/L (ref 19–32)
Calcium: 9.6 mg/dL (ref 8.4–10.5)
Chloride: 100 mEq/L (ref 96–112)
Creat: 0.52 mg/dL (ref 0.50–1.10)
GFR, Est African American: >89 mL/min
GFR, Est Non African American: >89 mL/min
Glucose, Bld: 273 mg/dL — ABNORMAL HIGH (ref 70–99)
Potassium: 4.5 mEq/L (ref 3.5–5.3)
Sodium: 136 mEq/L (ref 135–145)
Total Bilirubin: 0.6 mg/dL (ref 0.2–1.2)
Total Protein: 7.4 g/dL (ref 6.0–8.3)

## 2014-09-19 LAB — POCT CBC
Granulocyte percent: 66.7 %G (ref 37–80)
HCT, POC: 46.4 % (ref 37.7–47.9)
Hemoglobin: 15.8 g/dL (ref 12.2–16.2)
Lymph, poc: 2.6 (ref 0.6–3.4)
MCH, POC: 28.3 pg (ref 27–31.2)
MCHC: 34 g/dL (ref 31.8–35.4)
MCV: 83.4 fL (ref 80–97)
MID (cbc): 0.5 (ref 0–0.9)
MPV: 7.5 fL (ref 0–99.8)
POC Granulocyte: 6.2 (ref 2–6.9)
POC LYMPH PERCENT: 28.2 %L (ref 10–50)
POC MID %: 5.1 %M (ref 0–12)
Platelet Count, POC: 279 10*3/uL (ref 142–424)
RBC: 5.56 M/uL — AB (ref 4.04–5.48)
RDW, POC: 14.1 %
WBC: 9.3 10*3/uL (ref 4.6–10.2)

## 2014-09-19 LAB — POCT SEDIMENTATION RATE: POCT SED RATE: 18 mm/hr (ref 0–22)

## 2014-09-19 LAB — POCT GLYCOSYLATED HEMOGLOBIN (HGB A1C): Hemoglobin A1C: 9.5

## 2014-09-19 MED ORDER — ALBUTEROL SULFATE HFA 108 (90 BASE) MCG/ACT IN AERS: 1.0000 | INHALATION_SPRAY | Freq: Four times a day (QID) | RESPIRATORY_TRACT | Status: DC | PRN

## 2014-09-19 MED ORDER — DESVENLAFAXINE SUCCINATE ER 100 MG PO TB24: 100.0000 mg | ORAL_TABLET | Freq: Every day | ORAL | Status: DC

## 2014-09-19 MED ORDER — CLONAZEPAM 1 MG PO TABS: 1.0000 mg | ORAL_TABLET | Freq: Two times a day (BID) | ORAL | Status: DC | PRN

## 2014-09-19 MED ORDER — CANAGLIFLOZIN 300 MG PO TABS: 300.0000 mg | ORAL_TABLET | Freq: Every day | ORAL | Status: DC

## 2014-09-19 MED ORDER — HYDROCODONE-ACETAMINOPHEN 7.5-325 MG PO TABS: 1.0000 | ORAL_TABLET | Freq: Two times a day (BID) | ORAL | Status: DC | PRN

## 2014-09-19 MED ORDER — OMEPRAZOLE 20 MG PO CPDR: 20.0000 mg | DELAYED_RELEASE_CAPSULE | Freq: Every day | ORAL | Status: DC

## 2014-09-19 NOTE — Patient Instructions (Addendum)
I am referring you to a pain clinic.  I have refilled your current medications.  Your blood sugars are running too high. Immediate back on the intracondylar and we need to recheck how things are going in one month.

## 2014-09-19 NOTE — Progress Notes (Signed)
Patient ID: Ann Lopez, female   DOB: 05/07/1970, 44 y.o.   MRN: [id number]   This chart was scribed for Ann Haber, MD by [facility], medical scribe at [facility].The patient was seen in exam room 11 and the patient's care was started at 10:38 AM.  Patient ID: Ann Lopez MRN: [id number], DOB: 05/16/1970, 45 y.o. Date of Encounter: 09/19/2014  Primary Physician: Hayden Rasmussen., MD  Chief Complaint:  Chief Complaint  Patient presents with   Back Pain    ongoing issue   HPI:  Ann Lopez is a 44 y.o. female who presents to [facility] complaining of chronic lower back pain.  Back pain: Chronic back pain, left worse than right. She has a CT scan in October 2015 which showed a left sided hernia. Seen at [facility] and had injections which were not helpful. She has taken gabapentin for no relief. Takes Zometa IV every six months at [facility] for bone loss. Pt lives with her children 75, 3, 62, and 54. Pt was a Engineer, technical sales.  Anxiety: She has had several panic attacks recently, she says this is mostly due to her pain. She takes klonopin, and PRISTIQ for relief. No suicidal ideation or self injury. She has 4 children, 2 of which are in college  Asthma: She needs her albuterol inhaler refilled.   Diabetes: Pt has not been checking her blood sugar. Last A1c was in December 2015  Past Medical History  Diagnosis Date   Diabetes mellitus    Back pain    Osteopenia    Allergy    Asthma    Cancer    Breast cancer    Depression    Anxiety    Osteoporosis     osteopenia    Home Meds: Prior to Admission medications   Medication Sig Start Date End Date Taking? Authorizing Provider  albuterol (PROVENTIL HFA;VENTOLIN HFA) 108 (90 BASE) MCG/ACT inhaler Inhale 1 puff into the lungs every 6 (six) hours as needed for wheezing or shortness of breath. 03/24/14  Yes Ann Haber, MD  albuterol (PROVENTIL) (2.5 MG/3ML)  0.083% nebulizer solution Take 3 mLs (2.5 mg total) by nebulization every 6 (six) hours as needed for wheezing or shortness of breath. [birth year]49  Yes Delora Fuel, MD  ALPRAZolam Duanne Moron) 1 MG tablet TAKE 1 TABLET (1MG  TOTAL) BY MOUTH AT BEDTIME AS NEEDED FOR SLEEP 03/24/14  Yes Ann Haber, MD  canagliflozin [facility]) 300 MG TABS tablet Take 300 mg by mouth daily before breakfast. 03/24/14  Yes Ann Haber, MD  Choline Fenofibrate 135 MG capsule Take 1 capsule (135 mg total) by mouth daily. 05/09/14  Yes Ann Haber, MD  clonazePAM (KLONOPIN) 1 MG tablet Take 1 tablet (1 mg total) by mouth 2 (two) times daily as needed for anxiety. 03/24/14  Yes Ann Haber, MD  cyclobenzaprine (FLEXERIL) 10 MG tablet Take 1 tablet (10 mg total) by mouth 2 (two) times daily as needed for muscle spasms. 09/06/14  Yes Hope Bunnie Pion, NP  desvenlafaxine (PRISTIQ) 100 MG 24 hr tablet Take 1 tablet (100 mg total) by mouth daily. 03/24/14  Yes Ann Haber, MD  omeprazole (PRILOSEC) 20 MG capsule Take 1 capsule (20 mg total) by mouth daily. 08/21/12  Yes Ann Haber, MD  HYDROcodone-acetaminophen (NORCO) 7.5-325 MG per tablet Take 1 tablet by mouth every 8 (eight) hours as needed for moderate pain. Patient not taking: Reported on 09/19/2014 07/09/14   Ann Haber,  MD  levofloxacin (LEVAQUIN) 750 MG tablet Take 1 tablet (750 mg total) by mouth daily. Patient not taking: Reported on 09/19/2014 06/09/14   Ann Haber, MD  Zoledronic Acid (ZOMETA IV) Inject into the vein. Every 6 months    Historical Provider, MD   Allergies:  Allergies  Allergen Reactions   Azithromycin     Unknown   Morphine And Related     Can tolerate hydrocodone and dilaudid without side effects   Nitrofurantoin     Unknown   Penicillins Hives and Itching   Sulfonamide Derivatives Hives   History   Social History   Marital Status: Single    Spouse Name: N/A   Number of Children: N/A   Years of Education: N/A    Occupational History   Not on file.   Social History Main Topics   Smoking status: Former Smoker    Types: Cigarettes    Quit date: 04/21/2007   Smokeless tobacco: Not on file   Alcohol Use: No   Drug Use: No   Sexual Activity: Not Currently   Other Topics Concern   Not on file   Social History Narrative    Review of Systems: Constitutional: negative for chills, fever, night sweats, weight changes, or fatigue  HEENT: negative for vision changes, hearing loss, congestion, rhinorrhea, ST, epistaxis, or sinus pressure Cardiovascular: negative for chest pain or palpitations Respiratory: negative for hemoptysis, wheezing, shortness of breath, or cough Abdominal: negative for abdominal pain, nausea, vomiting, diarrhea, or constipation Dermatological: negative for rash Msk: Positive for back pain. Neurologic: negative for headache, dizziness, or syncope Psych: Positive for anxiety. All other systems reviewed and are otherwise negative with the exception to those above and in the HPI.  Physical Exam: Blood pressure 140/82, pulse 86, temperature 98.1 F (36.7 C), temperature source Oral, resp. rate 18, height 5\' 1"  (1.549 m), weight 190 lb (86.183 kg), SpO2 98 %., Body mass index is 35.92 kg/(m^2). General: Well developed, well nourished, tearful in the exam room. Head: Normocephalic, atraumatic, eyes without discharge, sclera non-icteric, nares are without discharge. Bilateral auditory canals clear, TM's are without perforation, pearly grey and translucent with reflective cone of light bilaterally. Oral cavity moist, posterior pharynx without exudate, erythema, peritonsillar abscess, or post nasal drip.  Neck: Supple. No thyromegaly. Full ROM. No lymphadenopathy. Lungs: Clear bilaterally to auscultation without wheezes, rales, or rhonchi. Breathing is unlabored. Heart: RRR with S1 S2. No murmurs, rubs, or gallops appreciated. Abdomen: Soft, non-tender, non-distended with  normoactive bowel sounds. No hepatomegaly. No rebound/guarding. No obvious abdominal masses. Msk:  Strength and tone normal for age. Extremities/Skin: Warm and dry. No clubbing or cyanosis. No edema. No rashes or suspicious lesions. Neuro: Alert and oriented X 3. Moves all extremities spontaneously. Gait is normal. CNII-XII grossly in tact. Reflexes are normal in her ankles and knees although she does have some pain with left leg straight leg raising Psych:  Responds to questions appropriately with a depressed affect, crying frequently during the interview    Labs: Results for orders placed or performed in visit on 09/19/14  POCT CBC  Result Value Ref Range   WBC 9.3 4.6 - 10.2 K/uL   Lymph, poc 2.6 0.6 - 3.4   POC LYMPH PERCENT 28.2 10 - 50 %L   MID (cbc) 0.5 0 - 0.9   POC MID % 5.1 0 - 12 %M   POC Granulocyte 6.2 2 - 6.9   Granulocyte percent 66.7 37 - 80 %G   RBC 5.56 (  A) 4.04 - 5.48 M/uL   Hemoglobin 15.8 12.2 - 16.2 g/dL   HCT, POC 46.4 37.7 - 47.9 %   MCV 83.4 80 - 97 fL   MCH, POC 28.3 27 - 31.2 pg   MCHC 34.0 31.8 - 35.4 g/dL   RDW, POC 14.1 %   Platelet Count, POC 279 142 - 424 K/uL   MPV 7.5 0 - 99.8 fL  POCT glycosylated hemoglobin (Hb A1C)  Result Value Ref Range   Hemoglobin A1C 9.5      ASSESSMENT AND PLAN:  44 y.o. year old female with   This chart was scribed in my presence and reviewed by me personally.    ICD-9-CM ICD-10-CM   1. Gastroesophageal reflux disease without esophagitis 530.81 K21.9 omeprazole (PRILOSEC) 20 MG capsule     POCT CBC  2. DEPRESSION/ANXIETY 300.4 F34.1 desvenlafaxine (PRISTIQ) 100 MG 24 hr tablet  3. Panic 300.01 F41.0 desvenlafaxine (PRISTIQ) 100 MG 24 hr tablet  4. Panic anxiety syndrome 300.01 F41.0 clonazePAM (KLONOPIN) 1 MG tablet  5. Asthma, chronic, mild intermittent, uncomplicated 968.86 Y84.72 albuterol (PROVENTIL HFA;VENTOLIN HFA) 108 (90 BASE) MCG/ACT inhaler  6. Type 2 diabetes mellitus without complication 072.18 C88.3  canagliflozin (INVOKANA) 300 MG TABS tablet     POCT glycosylated hemoglobin (Hb A1C)     COMPLETE METABOLIC PANEL WITH GFR  7. Other back pain 724.5 M54.89 HYDROcodone-acetaminophen (NORCO) 7.5-325 MG per tablet     POCT SEDIMENTATION RATE     Ambulatory referral to Pain Clinic    Signed, Ann Haber, MD 09/19/2014 10:38 AM

## 2014-10-08 IMAGING — CR DG CHEST 2V
2 series · 2 of 2 positions shown · non-contrast
Comparison: Chest x-ray 09/25/2012.

CLINICAL DATA: Cough.  Fever.

CHEST - 2 VIEW

[view not recorded (1 of 2)]
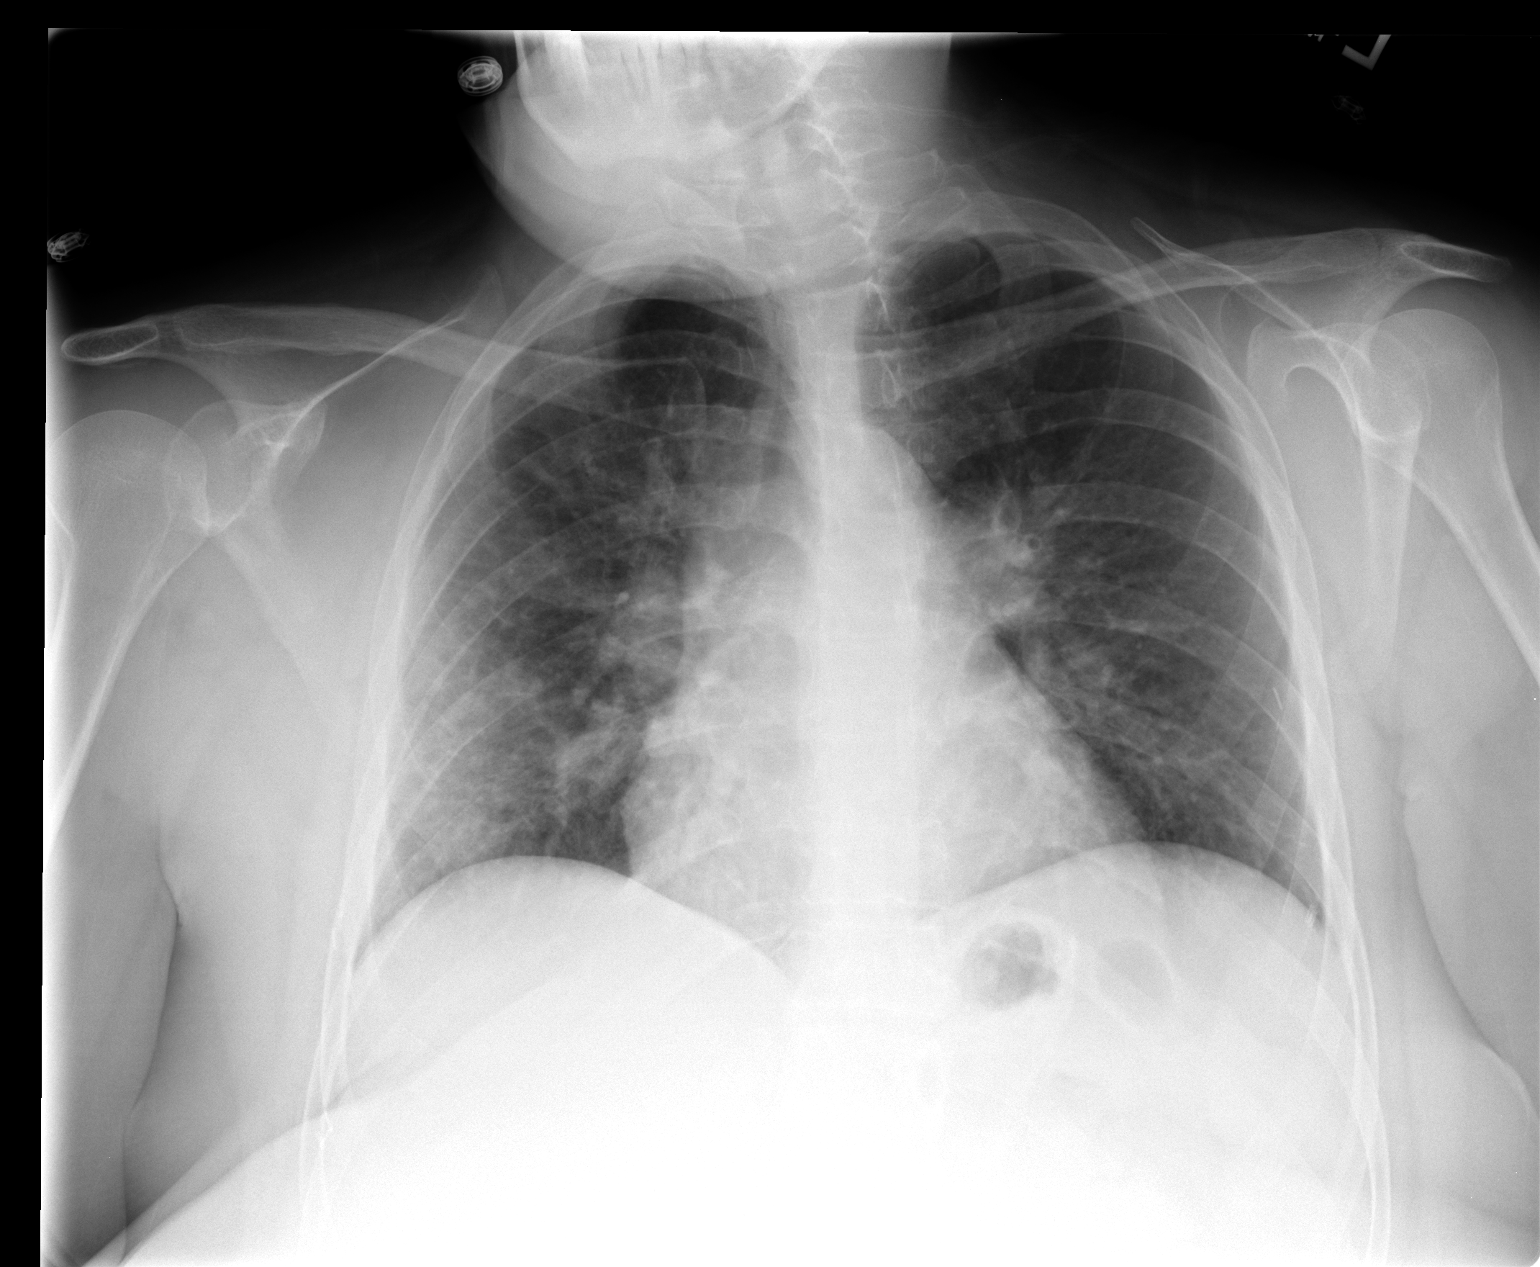

[view not recorded (2 of 2)]
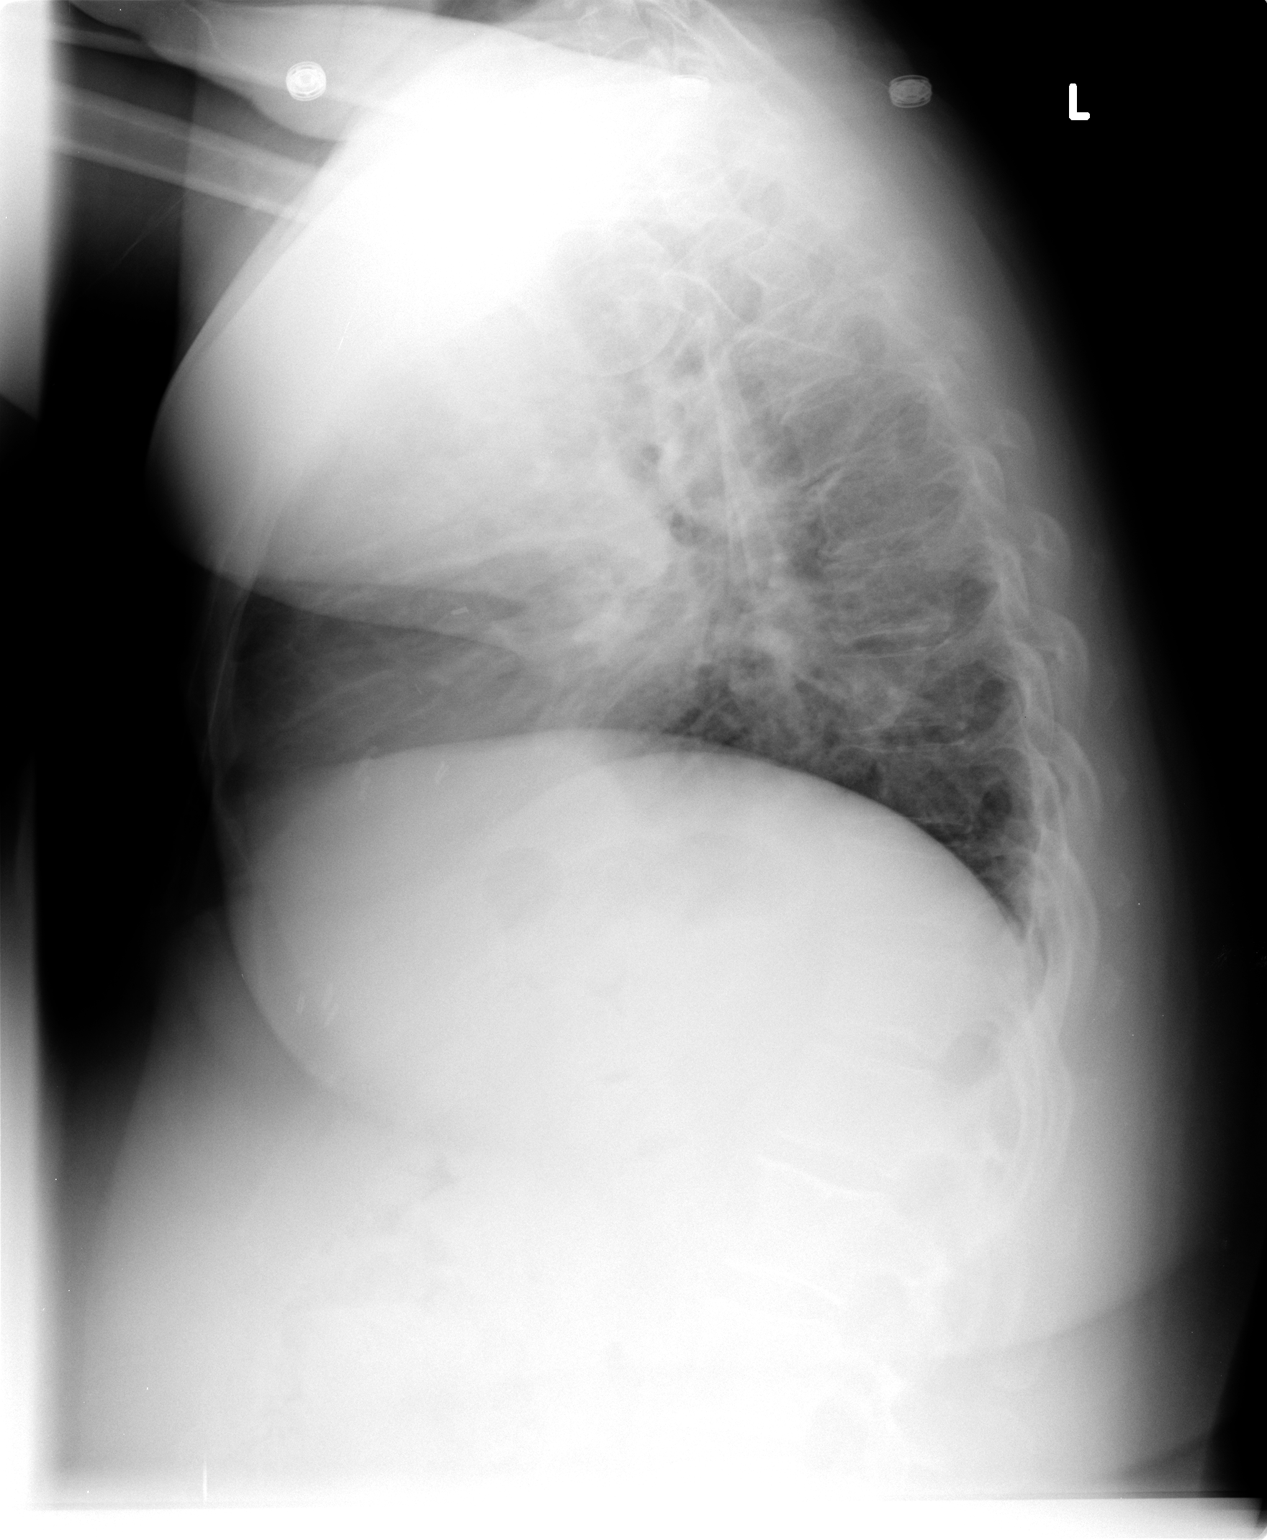

[2 of 2 positions shown; findings below may reference images not displayed]

FINDINGS: There are ill-defined areas of interstitial prominence
and subtle hazy airspace disease in the right mid to lower lung,
which may represent early changes of bronchopneumonia. These are
difficult to localize on the lateral projection.  The lungs are
otherwise clear.  No pleural effusions.  No evidence of pulmonary
edema.  Heart size is normal. The patient is rotated to the right
on today's exam, resulting in distortion of the mediastinal
contours and reduced diagnostic sensitivity and specificity for
mediastinal pathology.
IMPRESSION: 1.  Findings, as above, concerning for early changes of
bronchopneumonia in the right mid to lower lung.

## 2014-10-24 ENCOUNTER — Ambulatory Visit (INDEPENDENT_AMBULATORY_CARE_PROVIDER_SITE_OTHER): Payer: Medicare Other | Admitting: Family Medicine

## 2014-10-24 VITALS — BP 90/60 | HR 72 | Temp 98.0°F | Resp 20 | Ht 61.75 in | Wt 188.2 lb

## 2014-10-24 DIAGNOSIS — G43009 Migraine without aura, not intractable, without status migrainosus: Secondary | ICD-10-CM | POA: Diagnosis not present

## 2014-10-24 DIAGNOSIS — R350 Frequency of micturition: Secondary | ICD-10-CM | POA: Diagnosis not present

## 2014-10-24 DIAGNOSIS — G8929 Other chronic pain: Secondary | ICD-10-CM | POA: Diagnosis not present

## 2014-10-24 DIAGNOSIS — J0100 Acute maxillary sinusitis, unspecified: Secondary | ICD-10-CM | POA: Diagnosis not present

## 2014-10-24 DIAGNOSIS — F410 Panic disorder [episodic paroxysmal anxiety] without agoraphobia: Secondary | ICD-10-CM

## 2014-10-24 DIAGNOSIS — M545 Low back pain: Secondary | ICD-10-CM

## 2014-10-24 DIAGNOSIS — M5489 Other dorsalgia: Secondary | ICD-10-CM | POA: Diagnosis not present

## 2014-10-24 LAB — POCT URINALYSIS DIPSTICK
Bilirubin, UA: NEGATIVE
Glucose, UA: >1000
Ketones, UA: NEGATIVE
Nitrite, UA: NEGATIVE
Spec Grav, UA: 1.015
Urobilinogen, UA: 1
pH, UA: 7

## 2014-10-24 LAB — POCT UA - MICROSCOPIC ONLY
Bacteria, U Microscopic: NEGATIVE
Casts, Ur, LPF, POC: NEGATIVE
Crystals, Ur, HPF, POC: NEGATIVE
Mucus, UA: NEGATIVE
Yeast, UA: NEGATIVE

## 2014-10-24 MED ORDER — LEVOFLOXACIN 500 MG PO TABS: 500.0000 mg | ORAL_TABLET | Freq: Every day | ORAL | Status: DC

## 2014-10-24 MED ORDER — HYDROCODONE-ACETAMINOPHEN 7.5-325 MG PO TABS: 1.0000 | ORAL_TABLET | Freq: Two times a day (BID) | ORAL | Status: DC | PRN

## 2014-10-24 NOTE — Progress Notes (Signed)
Patient ID: Ann Lopez, female   DOB: January 10, 1971, 44 y.o.   MRN: [id number]   This chart was scribed for Ann Haber, MD by [facility], medical scribe at [facility].The patient was seen in exam room 02 and the patient's care was started at 1:12 PM.  Patient ID: Ann Lopez MRN: [id number], DOB: [birth year], 44 y.o. Date of Encounter: 10/24/2014  Primary Physician: Hayden Rasmussen., MD  Chief Complaint:  Chief Complaint  Patient presents with   Follow-up   Medication Refill    Norco   Depression    Per Triage Screen   HPI:  Ann Lopez is a 44 y.o. female who presents to [facility] for a follow up  Back pain: Pain not as bad since she has been on her medication, very much improved from last month.  Dysuria and increased urinary frequency.  This has been ongoing for one week.  She has had a complete hysterectomy.  Diabetes: Blood sugar has been running around 140-160, since she has gotten back on her pain medication and able to be more active.  Headaches: She complains of daily headaches for the past two weeks. She has occasional photophobia. She does have sinus congestion and bilateral ear drainage. Nausea associated with her headache, on bad days.  Anxiety: This past month she complains of increased panic attacks.   Past Medical History  Diagnosis Date   Diabetes mellitus    Back pain    Osteopenia    Allergy    Asthma    Cancer    Breast cancer    Depression    Anxiety    Osteoporosis     osteopenia    Home Meds: Prior to Admission medications   Medication Sig Start Date End Date Taking? Authorizing Provider  albuterol (PROVENTIL HFA;VENTOLIN HFA) 108 (90 BASE) MCG/ACT inhaler Inhale 1 puff into the lungs every 6 (six) hours as needed for wheezing or shortness of breath. 09/19/14  Yes Ann Haber, MD  albuterol (PROVENTIL) (2.5 MG/3ML) 0.083% nebulizer solution Take 3 mLs (2.5 mg total) by  nebulization every 6 (six) hours as needed for wheezing or shortness of breath. 2/42/68  Yes Delora Fuel, MD  canagliflozin [facility]) 300 MG TABS tablet Take 300 mg by mouth daily before breakfast. 09/19/14  Yes Ann Haber, MD  Choline Fenofibrate 135 MG capsule Take 1 capsule (135 mg total) by mouth daily. 05/09/14  Yes Ann Haber, MD  clonazePAM (KLONOPIN) 1 MG tablet Take 1 tablet (1 mg total) by mouth 2 (two) times daily as needed for anxiety. 09/19/14  Yes Ann Haber, MD  desvenlafaxine (PRISTIQ) 100 MG 24 hr tablet Take 1 tablet (100 mg total) by mouth daily. 09/19/14  Yes Ann Haber, MD  HYDROcodone-acetaminophen (NORCO) 7.5-325 MG per tablet Take 1 tablet by mouth 2 (two) times daily as needed for moderate pain. 09/19/14  Yes Ann Haber, MD  omeprazole (PRILOSEC) 20 MG capsule Take 1 capsule (20 mg total) by mouth daily. 09/19/14  Yes Ann Haber, MD   Allergies:  Allergies  Allergen Reactions   Azithromycin     Unknown   Morphine And Related     Can tolerate hydrocodone and dilaudid without side effects   Nitrofurantoin     Unknown   Penicillins Hives and Itching   Sulfonamide Derivatives Hives   History   Social History   Marital Status: Single    Spouse Name: N/A   Number of Children: N/A  Years of Education: N/A   Occupational History   Not on file.   Social History Main Topics   Smoking status: Former Smoker    Types: Cigarettes    Quit date: 04/21/2007   Smokeless tobacco: Never Used   Alcohol Use: No   Drug Use: No   Sexual Activity: Not Currently   Other Topics Concern   Not on file   Social History Narrative    Review of Systems: Constitutional: negative for chills, fever, night sweats, weight changes, or fatigue  HEENT: negative for hearing loss,  rhinorrhea, ST, epistaxis. Positive for sinus congestion and ear drainage. Cardiovascular: negative for chest pain or palpitations Respiratory: negative for  hemoptysis, wheezing, shortness of breath, or cough Abdominal: negative for abdominal pain, vomiting, diarrhea, or constipation. Positive for nausea. Genitourinary: Positive for dysuria and increased urinary frequency. Msk: Positive for back pain. Dermatological: negative for rash Neurologic negative for dizziness, or syncope. Positive for headache and photophobia. Psychological: Positive of anxiety. All other systems reviewed and are otherwise negative with the exception to those above and in the HPI.  Physical Exam: Blood pressure 90/60, pulse 72, temperature 98 F (36.7 C), temperature source Oral, resp. rate 20, height 5' 1.75" (1.568 m), weight 188 lb 4 oz (85.39 kg), SpO2 98 %., Body mass index is 34.73 kg/(m^2). General: Well developed, well nourished, in no acute distress. Head: Normocephalic, atraumatic, eyes without discharge, sclera non-icteric, nares are without discharge. Bilateral auditory canals clear, TM's are without perforation, pearly grey and translucent with reflective cone of light bilaterally. Oral cavity moist, posterior pharynx without exudate, erythema, peritonsillar abscess, or post nasal drip.  Neck: Supple. No thyromegaly. Full ROM. No lymphadenopathy. Lungs: Clear bilaterally to auscultation without wheezes, rales, or rhonchi. Breathing is unlabored. Heart: RRR with S1 S2. No murmurs, rubs, or gallops appreciated. Abdomen: Soft, non-tender, non-distended with normoactive bowel sounds. No hepatomegaly. No rebound/guarding. No obvious abdominal masses. Msk:  Strength and tone normal for age. Extremities/Skin: Warm and dry. No clubbing or cyanosis. No edema. No rashes or suspicious lesions. Neuro: Alert and oriented X 3. Moves all extremities spontaneously. Gait is normal. CNII-XII grossly in tact. Psych:  Responds to questions appropriately with a normal affect.   Labs: Results for orders placed or performed in visit on 10/24/14  POCT UA - Microscopic Only    Result Value Ref Range   WBC, Ur, HPF, POC mod    RBC, urine, microscopic man    Bacteria, U Microscopic neg    Mucus, UA neg    Epithelial cells, urine per micros 0-6    Crystals, Ur, HPF, POC neg    Casts, Ur, LPF, POC neg    Yeast, UA neg   POCT urinalysis dipstick  Result Value Ref Range   Color, UA yellow    Clarity, UA cloudy    Glucose, UA >1000    Bilirubin, UA neg    Ketones, UA neg    Spec Grav, UA 1.015    Blood, UA trace    pH, UA 7.0    Protein, UA nrg    Urobilinogen, UA 1.0    Nitrite, UA neg    Leukocytes, UA Trace (A) Negative     ASSESSMENT AND PLAN:  44 y.o. year old female with  This chart was scribed in my presence and reviewed by me personally.    ICD-9-CM ICD-10-CM   1. Frequency of urination 788.41 R35.0 POCT UA - Microscopic Only     POCT urinalysis dipstick  Urine culture     levofloxacin (LEVAQUIN) 500 MG tablet  2. Migraine without aura and without status migrainosus, not intractable 346.10 G43.009   3. Panic attacks 300.01 F41.0   4. Chronic low back pain 724.2 M54.5 HYDROcodone-acetaminophen (NORCO) 7.5-325 MG per tablet   338.29 G89.29   5. Other back pain 724.5 M54.89 HYDROcodone-acetaminophen (NORCO) 7.5-325 MG per tablet  6. Subacute maxillary sinusitis 461.0 J01.00 levofloxacin (LEVAQUIN) 500 MG tablet     Signed, Ann Haber, MD   Signed, Ann Haber, MD 10/24/2014 12:59 PM

## 2014-10-24 NOTE — Patient Instructions (Signed)
If by Tuesday you are not seeing relief from the headaches, let me know and I will prescribe Topamax or Zonisamide.

## 2014-10-27 ENCOUNTER — Other Ambulatory Visit: Payer: Self-pay | Admitting: Family Medicine

## 2014-10-27 DIAGNOSIS — R519 Headache, unspecified: Secondary | ICD-10-CM

## 2014-10-27 DIAGNOSIS — R51 Headache: Principal | ICD-10-CM

## 2014-10-27 LAB — URINE CULTURE: Colony Count: >=100000

## 2014-11-24 ENCOUNTER — Encounter: Payer: Self-pay | Admitting: Neurology

## 2014-11-24 ENCOUNTER — Ambulatory Visit (INDEPENDENT_AMBULATORY_CARE_PROVIDER_SITE_OTHER): Payer: Medicare Other | Admitting: Neurology

## 2014-11-24 ENCOUNTER — Ambulatory Visit (INDEPENDENT_AMBULATORY_CARE_PROVIDER_SITE_OTHER): Payer: Medicare Other | Admitting: Family Medicine

## 2014-11-24 VITALS — BP 122/70 | HR 98 | Temp 98.2°F | Resp 16 | Ht 61.75 in | Wt 192.0 lb

## 2014-11-24 VITALS — BP 92/64 | HR 68 | Ht 61.75 in | Wt 191.0 lb

## 2014-11-24 DIAGNOSIS — G43709 Chronic migraine without aura, not intractable, without status migrainosus: Secondary | ICD-10-CM

## 2014-11-24 DIAGNOSIS — F329 Major depressive disorder, single episode, unspecified: Secondary | ICD-10-CM | POA: Diagnosis not present

## 2014-11-24 DIAGNOSIS — F32A Depression, unspecified: Secondary | ICD-10-CM

## 2014-11-24 DIAGNOSIS — R0982 Postnasal drip: Secondary | ICD-10-CM | POA: Diagnosis not present

## 2014-11-24 DIAGNOSIS — Z853 Personal history of malignant neoplasm of breast: Secondary | ICD-10-CM | POA: Diagnosis not present

## 2014-11-24 DIAGNOSIS — G8929 Other chronic pain: Secondary | ICD-10-CM

## 2014-11-24 DIAGNOSIS — M545 Low back pain: Secondary | ICD-10-CM

## 2014-11-24 DIAGNOSIS — M5489 Other dorsalgia: Secondary | ICD-10-CM | POA: Diagnosis not present

## 2014-11-24 DIAGNOSIS — IMO0002 Reserved for concepts with insufficient information to code with codable children: Secondary | ICD-10-CM

## 2014-11-24 MED ORDER — HYDROCODONE-ACETAMINOPHEN 7.5-325 MG PO TABS: 1.0000 | ORAL_TABLET | Freq: Two times a day (BID) | ORAL | Status: DC | PRN

## 2014-11-24 MED ORDER — IPRATROPIUM BROMIDE 0.03 % NA SOLN: 2.0000 | Freq: Four times a day (QID) | NASAL | Status: DC

## 2014-11-24 NOTE — Progress Notes (Signed)
[facility] [address], [place] [address] [phone]  Date:  11/24/2014   Name:  Ann Lopez   DOB:  Nov 29, 1970   MRN:  [id number]  PCP:  Robyn Haber, MD    Chief Complaint: Medication Refill and Nasal Congestion   History of Present Illness:  Ann Lopez is a 44 y.o. very pleasant female patient who presents with the following:  Reviewed controlled substance database- it appear that Dr. Joseph Art has been treating her with hydrocodone apap 7.5 #60 as a 30 day supply for the last few months, as well as a script for clonazepam and alprazolam at the end of March 2016.  She takes this for her back pain.  She is due for a RF today She also has noted some nasal congestion- pressure in her sinuses, treated with levaquin a month ago. She does have PND.  No fever,  Her ears do hurt some. She has some sneezing and some cough.  She uses an inhaler as needed for cough  Patient Active Problem List   Diagnosis Date Noted  . Femoral hernia 03/24/2014  . Dyslipidemia 09/10/2011  . BRCA1 positive 09/10/2011  . Breast cancer 09/10/2011  . H/O gastric bypass; 05/14/11([facility]) 09/10/2011  . HYPERTENSION 10/28/2008  . DEPRESSION/ANXIETY 02/19/2007  . CARPAL TUNNEL SYNDROME, RIGHT 02/19/2007  . ALLERGIC RHINITIS 02/19/2007  . GERD 02/19/2007  . IRRITABLE BOWEL SYNDROME 02/19/2007  . HERPES GENITALIS 12/30/2006  . DM 12/30/2006  . OSTEOPENIA 12/30/2006  . ANOMALY, CONGENITAL, NERVOUS SYSTEM NEC 12/30/2006    Past Medical History  Diagnosis Date  . Diabetes mellitus   . Back pain   . Osteopenia   . Allergy   . Asthma   . Cancer   . Breast cancer   . Depression   . Anxiety   . Osteoporosis     osteopenia  . Headache   . Hyperlipemia     Past Surgical History  Procedure Laterality Date  . Mastectomy Bilateral   . Abdominal hysterectomy    . Lymphadenectomy    . Cesarean section    . Breast surgery    . Incision and drainage abscess Left  11/14/2012    Procedure: INCISION AND DRAINAGE ABSCESS LEFT GROIN;  Surgeon: Jamesetta So, MD;  Location: [facility];  Service: General;  Laterality: Left;    Social History  Substance Use Topics  . Smoking status: Former Smoker    Types: Cigarettes    Quit date: 04/21/2007  . Smokeless tobacco: Never Used  . Alcohol Use: No    Family History  Problem Relation Age of Onset  . Cancer Mother 43    breast cancer  . Hypertension Sister   . Hypothyroidism Brother   . Cancer Maternal Grandmother 62    breast cancer  . Cancer Maternal Grandfather 55    pancreatic cancer    Allergies  Allergen Reactions  . Azithromycin     Unknown  . Morphine And Related     Can tolerate hydrocodone and dilaudid without side effects  . Nitrofurantoin     Unknown  . Penicillins Hives and Itching  . Sulfonamide Derivatives Hives    Medication list has been reviewed and updated.  Current Outpatient Prescriptions on File Prior to Visit  Medication Sig Dispense Refill  . albuterol (PROVENTIL HFA;VENTOLIN HFA) 108 (90 BASE) MCG/ACT inhaler Inhale 1 puff into the lungs every 6 (six) hours as needed for wheezing or shortness of breath. 1 Inhaler 6  .  albuterol (PROVENTIL) (2.5 MG/3ML) 0.083% nebulizer solution Take 3 mLs (2.5 mg total) by nebulization every 6 (six) hours as needed for wheezing or shortness of breath. 75 mL 12  . canagliflozin (INVOKANA) 300 MG TABS tablet Take 300 mg by mouth daily before breakfast. 90 tablet 3  . Choline Fenofibrate 135 MG capsule Take 1 capsule (135 mg total) by mouth daily. 90 capsule 3  . clonazePAM (KLONOPIN) 1 MG tablet Take 1 tablet (1 mg total) by mouth 2 (two) times daily as needed for anxiety. 180 tablet 1  . desvenlafaxine (PRISTIQ) 100 MG 24 hr tablet Take 1 tablet (100 mg total) by mouth daily. 90 tablet 3  . HYDROcodone-acetaminophen (NORCO) 7.5-325 MG per tablet Take 1 tablet by mouth 2 (two) times daily as needed for moderate pain. 60 tablet 0  .  omeprazole (PRILOSEC) 20 MG capsule Take 1 capsule (20 mg total) by mouth daily. 30 capsule 1  . [DISCONTINUED] fluticasone (FLONASE) 50 MCG/ACT nasal spray Place 2 sprays into the nose daily. (Patient not taking: Reported on 03/10/2014) 1 g 12   No current facility-administered medications on file prior to visit.    Review of Systems:  As per HPI- otherwise negative.   Physical Examination: Filed Vitals:   11/24/14 1004  BP: 122/70  Pulse: 98  Temp: 98.2 F (36.8 C)  Resp: 16   Filed Vitals:   11/24/14 1004  Height: 5' 1.75" (1.568 m)  Weight: 192 lb (87.091 kg)   Body mass index is 35.42 kg/(m^2). Ideal Body Weight: Weight in (lb) to have BMI = 25: 135.3  GEN: WDWN, NAD, Non-toxic, A & O x 3 HEENT: Atraumatic, Normocephalic. Neck supple. No masses, No LAD.  Bilateral TM wnl, oropharynx normal.  PEERL,EOMI.   Nasal cavity is inflamed c/w allergies Ears and Nose: No external deformity. CV: RRR, No M/G/R. No JVD. No thrill. No extra heart sounds. PULM: CTA B, no wheezes, crackles, rhonchi. No retractions. No resp. distress. No accessory muscle use. EXTR: No c/c/e NEURO Normal gait.  PSYCH: Normally interactive. Conversant. Not depressed or anxious appearing.  Calm demeanor.    Assessment and Plan: Other back pain - Plan: HYDROcodone-acetaminophen (NORCO) 7.5-325 MG per tablet  Chronic low back pain - Plan: HYDROcodone-acetaminophen (NORCO) 7.5-325 MG per tablet  PND (post-nasal drip) - Plan: ipratropium (ATROVENT) 0.03 % nasal spray  Refilled her chronic pain medication, will try atrovent nasal for chronic nasal congestion, irritation and PND She is able to tolerate hydrocodone but not morphine   Signed Lamar Blinks, MD

## 2014-11-24 NOTE — Patient Instructions (Signed)
I refilled your usual hydrocodone today- continue to use as needed Let's try atrovent nasal for your nasal symptoms- I hope that it will help you, let me know if it does not!

## 2014-11-24 NOTE — Progress Notes (Signed)
PATIENT: Ann Lopez DOB: Nov 30, 1970  Chief Complaint  Patient presents with  . Headache    Reports having at least 5 headache days per week.  She has Norco prescribed to her for back pain that also helps with the more severe headaches.       HISTORICAL  Ann Lopez is a 44 years old right-handed female, seen in refer by her primary care physician Dr. Robyn Haber for evaluation of headaches   She had past medical history of left breast cancer, status post bilateral mastectomy, followed by chemotherapy in 2009, diabetes, hyperlipidemia, depression anxiety  She only has occasionally bifrontal headaches in the past, began to have increased headaches since May 2016, to almost daily bases, constant mild to moderate 5 out of 10 bilateral frontal, temporal, vortex region pressure headaches, couple times each week, is exacerbated to a more severe pounding headaches, lateralized, with associated light noise sensitivity, nauseous, lasting for 1 day, she is taking Norco for her chronic low back pain, only helped temporarily, ibuprofen is not helpful,   Her sister has history of migraine, she denies lateralized motor or sensory deficit  REVIEW OF SYSTEMS: Full 14 system review of systems performed and notable only for memory loss, headaches, depression, anxiety, decreased energy, disinteresting activities  ALLERGIES: Allergies  Allergen Reactions  . Azithromycin     Unknown  . Morphine And Related     Can tolerate hydrocodone and dilaudid without side effects  . Nitrofurantoin     Unknown  . Penicillins Hives and Itching  . Sulfonamide Derivatives Hives    HOME MEDICATIONS: Current Outpatient Prescriptions  Medication Sig Dispense Refill  . albuterol (PROVENTIL HFA;VENTOLIN HFA) 108 (90 BASE) MCG/ACT inhaler Inhale 1 puff into the lungs every 6 (six) hours as needed for wheezing or shortness of breath. 1 Inhaler 6  . albuterol (PROVENTIL) (2.5 MG/3ML) 0.083% nebulizer  solution Take 3 mLs (2.5 mg total) by nebulization every 6 (six) hours as needed for wheezing or shortness of breath. 75 mL 12  . canagliflozin (INVOKANA) 300 MG TABS tablet Take 300 mg by mouth daily before breakfast. 90 tablet 3  . Choline Fenofibrate 135 MG capsule Take 1 capsule (135 mg total) by mouth daily. 90 capsule 3  . clonazePAM (KLONOPIN) 1 MG tablet Take 1 tablet (1 mg total) by mouth 2 (two) times daily as needed for anxiety. 180 tablet 1  . desvenlafaxine (PRISTIQ) 100 MG 24 hr tablet Take 1 tablet (100 mg total) by mouth daily. 90 tablet 3  . HYDROcodone-acetaminophen (NORCO) 7.5-325 MG per tablet Take 1 tablet by mouth 2 (two) times daily as needed for moderate pain. 60 tablet 0  . omeprazole (PRILOSEC) 20 MG capsule Take 1 capsule (20 mg total) by mouth daily. 30 capsule 1  . [DISCONTINUED] fluticasone (FLONASE) 50 MCG/ACT nasal spray Place 2 sprays into the nose daily. (Patient not taking: Reported on 03/10/2014) 1 g 12   No current facility-administered medications for this visit.    PAST MEDICAL HISTORY: Past Medical History  Diagnosis Date  . Diabetes mellitus   . Back pain   . Osteopenia   . Allergy   . Asthma   . Cancer   . Breast cancer   . Depression   . Anxiety   . Osteoporosis     osteopenia  . Headache   . Hyperlipemia     PAST SURGICAL HISTORY: Past Surgical History  Procedure Laterality Date  . Mastectomy Bilateral   . Abdominal  hysterectomy    . Lymphadenectomy    . Cesarean section    . Breast surgery    . Incision and drainage abscess Left 11/14/2012    Procedure: INCISION AND DRAINAGE ABSCESS LEFT GROIN;  Surgeon: Jamesetta So, MD;  Location: [facility];  Service: General;  Laterality: Left;    FAMILY HISTORY: Family History  Problem Relation Age of Onset  . Cancer Mother 65    breast cancer  . Hypertension Sister   . Hypothyroidism Brother   . Cancer Maternal Grandmother 43    breast cancer  . Cancer Maternal Grandfather 50     pancreatic cancer    SOCIAL HISTORY:  Social History   Social History  . Marital Status: Single    Spouse Name: N/A  . Number of Children: 4  . Years of Education: 13   Occupational History  . Disabled    Social History Main Topics  . Smoking status: Former Smoker    Types: Cigarettes    Quit date: 04/21/2007  . Smokeless tobacco: Never Used  . Alcohol Use: No  . Drug Use: No  . Sexual Activity: Not Currently   Other Topics Concern  . Not on file   Social History Narrative   Lives at home with her four sons.   Right-handed.   2-3 cups caffeine daily.     PHYSICAL EXAM   Filed Vitals:   11/24/14 0800  BP: 92/64  Pulse: 68  Height: 5' 1.75" (1.568 m)  Weight: 191 lb (86.637 kg)    Not recorded      Body mass index is 35.24 kg/(m^2).  PHYSICAL EXAMNIATION:  Gen: NAD, conversant, well nourised, obese, well groomed                     Cardiovascular: Regular rate rhythm, no peripheral edema, warm, nontender. Eyes: Conjunctivae clear without exudates or hemorrhage Neck: Supple, no carotid bruise. Pulmonary: Clear to auscultation bilaterally   NEUROLOGICAL EXAM:  MENTAL STATUS: Speech:    Speech is normal; fluent and spontaneous with normal comprehension.  Cognition:     Orientation to time, place and person     Normal recent and remote memory     Normal Attention span and concentration     Normal Language, naming, repeating,spontaneous speech     Fund of knowledge   CRANIAL NERVES: CN II: Visual fields are full to confrontation. Fundoscopic exam is normal with sharp discs and no vascular changes. Pupils are round equal and briskly reactive to light. CN III, IV, VI: extraocular movement are normal. No ptosis. CN V: Facial sensation is intact to pinprick in all 3 divisions bilaterally. Corneal responses are intact.  CN VII: Face is symmetric with normal eye closure and smile. CN VIII: Hearing is normal to rubbing fingers CN IX, X: Palate elevates  symmetrically. Phonation is normal. CN XI: Head turning and shoulder shrug are intact CN XII: Tongue is midline with normal movements and no atrophy.  MOTOR: There is no pronator drift of out-stretched arms. Muscle bulk and tone are normal. Muscle strength is normal.  REFLEXES: Reflexes are 2+ and symmetric at the biceps, triceps, knees, and ankles. Plantar responses are flexor.  SENSORY: Intact to light touch, pinprick, position sense, and vibration sense are intact in fingers and toes.  COORDINATION: Rapid alternating movements and fine finger movements are intact. There is no dysmetria on finger-to-nose and heel-knee-shin.    GAIT/STANCE: Posture is normal. Gait is steady with normal steps,  base, arm swing, and turning. Heel and toe walking are normal. Tandem gait is normal.  Romberg is absent.   DIAGNOSTIC DATA (LABS, IMAGING, TESTING) - I reviewed patient records, labs, notes, testing and imaging myself where available.   ASSESSMENT AND PLAN  Ann Lopez is a 44 y.o. female   Chronic migraine  Start preventive medications, Topamax, 50 mg titrating to 2 tablets twice a day  Maxalt as needed History of breast cancer Depression anxiety  Marcial Pacas, M.D. Ph.D.  [facility] [address], [place], [place] [address] Ph: [phone] Fax: [phone]  CC: Robyn Haber, MD

## 2014-12-23 ENCOUNTER — Ambulatory Visit (INDEPENDENT_AMBULATORY_CARE_PROVIDER_SITE_OTHER): Payer: Medicare Other | Admitting: Family Medicine

## 2014-12-23 VITALS — BP 96/62 | HR 68 | Temp 98.1°F | Resp 16 | Ht 61.0 in | Wt 189.2 lb

## 2014-12-23 DIAGNOSIS — C4491 Basal cell carcinoma of skin, unspecified: Secondary | ICD-10-CM

## 2014-12-23 DIAGNOSIS — M545 Low back pain: Secondary | ICD-10-CM

## 2014-12-23 DIAGNOSIS — G8929 Other chronic pain: Secondary | ICD-10-CM

## 2014-12-23 DIAGNOSIS — M5489 Other dorsalgia: Secondary | ICD-10-CM | POA: Diagnosis not present

## 2014-12-23 DIAGNOSIS — J0121 Acute recurrent ethmoidal sinusitis: Secondary | ICD-10-CM | POA: Diagnosis not present

## 2014-12-23 MED ORDER — LEVOFLOXACIN 500 MG PO TABS: 500.0000 mg | ORAL_TABLET | Freq: Every day | ORAL | Status: DC

## 2014-12-23 MED ORDER — HYDROCODONE-ACETAMINOPHEN 7.5-325 MG PO TABS: 1.0000 | ORAL_TABLET | Freq: Two times a day (BID) | ORAL | Status: DC | PRN

## 2014-12-23 NOTE — Patient Instructions (Signed)
I'm referring you to a plastic surgeon in [place] for the basal cell on the right side of her nose  You have sinusitis and were ordering a antibiotic for that.  I am refilling her pain medicine while you get evaluated for the back pain.

## 2014-12-23 NOTE — Progress Notes (Signed)
This chart was scribed for Ann Haber, MD by Ann Lopez, medical scribe at [facility].The patient was seen in exam room 10 and the patient's care was started at 12:59 PM.  Patient ID: [name] MRN: [id number], DOB: 1971-01-21, 44 y.o. Date of Encounter: 12/23/2014  Primary Physician: Ann Haber, MD  Chief Complaint:  Chief Complaint  Patient presents with  . Back Pain    upper back x yesterday  . facial congestion    x 1 week  . Sore Throat    x 1 week    HPI:  Ann Lopez is a 44 y.o. female who presents to [facility] complaining of facial congestion with sore throat for past week.  She has some drainage in the back of her throat, and feels scratchy. She had some yellowish phlegm with "funny" smell. She also has some cough. She denies smoking and fever. She also has back pain starting yesterday.   She has a small bump on the right side of her nose. She was washing her face and thought she scratched it. The area started pouring out Lopez. She has h/o cancer.   She went to the neurologist for migraines and was started on topamax.   Past Medical History  Diagnosis Date  . Diabetes mellitus   . Back pain   . Osteopenia   . Allergy   . Asthma   . Cancer   . Breast cancer   . Depression   . Anxiety   . Osteoporosis     osteopenia  . Headache   . Hyperlipemia      Home Meds: Prior to Admission medications   Medication Sig Start Date End Date Taking? Authorizing Provider  albuterol (PROVENTIL HFA;VENTOLIN HFA) 108 (90 BASE) MCG/ACT inhaler Inhale 1 puff into the lungs every 6 (six) hours as needed for wheezing or shortness of breath. 09/19/14   Ann Haber, MD  albuterol (PROVENTIL) (2.5 MG/3ML) 0.083% nebulizer solution Take 3 mLs (2.5 mg total) by nebulization every 6 (six) hours as needed for wheezing or shortness of breath. 7/79/39   Ann Fuel, MD  canagliflozin [facility]) 300 MG TABS tablet Take 300 mg  by mouth daily before breakfast. 09/19/14   Ann Haber, MD  Choline Fenofibrate 135 MG capsule Take 1 capsule (135 mg total) by mouth daily. 05/09/14   Ann Haber, MD  clonazePAM (KLONOPIN) 1 MG tablet Take 1 tablet (1 mg total) by mouth 2 (two) times daily as needed for anxiety. 09/19/14   Ann Haber, MD  desvenlafaxine (PRISTIQ) 100 MG 24 hr tablet Take 1 tablet (100 mg total) by mouth daily. 09/19/14   Ann Haber, MD  HYDROcodone-acetaminophen (Keddie) 7.5-325 MG per tablet Take 1 tablet by mouth 2 (two) times daily as needed for moderate pain. 11/24/14   Ann Filler Copland, MD  ipratropium (ATROVENT) 0.03 % nasal spray Place 2 sprays into the nose 4 (four) times daily. 11/24/14   Ann Filler Copland, MD  omeprazole (PRILOSEC) 20 MG capsule Take 1 capsule (20 mg total) by mouth daily. 09/19/14   Ann Haber, MD    Allergies:  Allergies  Allergen Reactions  . Azithromycin     Unknown  . Morphine And Related     Can tolerate hydrocodone and dilaudid without side effects  . Nitrofurantoin     Unknown  . Penicillins Hives and Itching  . Sulfonamide Derivatives Hives    Social History   Social History  . Marital  Status: Single    Spouse Name: N/A  . Number of Children: 4  . Years of Education: 13   Occupational History  . Disabled    Social History Main Topics  . Smoking status: Former Smoker    Types: Cigarettes    Quit date: 04/21/2007  . Smokeless tobacco: Never Used  . Alcohol Use: No  . Drug Use: No  . Sexual Activity: Not Currently   Other Topics Concern  . Not on file   Social History Narrative   Lives at home with her four sons.   Right-handed.   2-3 cups caffeine daily.     Review of Systems: Constitutional: negative for chills, fever, night sweats, weight changes, or fatigue  HEENT: negative for vision changes, hearing loss, rhinorrhea, epistaxis; positive for sore throat, congestion, sinus pressure Cardiovascular: negative for chest pain or  palpitations Respiratory: negative for hemoptysis, wheezing, shortness of breath; positive for cough Abdominal: negative for abdominal pain, nausea, vomiting, diarrhea, or constipation Dermatological: negative for rash Neurologic: negative for headache, dizziness, or syncope; positive for bump (nose)  Musc: positive for back pain All other systems reviewed and are otherwise negative with the exception to those above and in the HPI.  Physical Exam: Lopez pressure 96/62, pulse 68, temperature 98.1 F (36.7 C), temperature source Oral, resp. rate 16, height 5\' 1"  (1.549 m), weight 189 lb 3.2 oz (85.821 kg), SpO2 98 %., Body mass index is 35.77 kg/(m^2). General: Well developed, well nourished, in no acute distress. Head: Normocephalic, atraumatic, eyes without discharge, sclera non-icteric, nares are without discharge. Bilateral auditory canals clear, TM's are without perforation, pearly grey (right TM is dull)  and translucent with reflective cone of light bilaterally. Oral cavity moist, posterior pharynx shows erythema, but no exudate or peritonsillar abscess. There is, however, post nasal drip.  Neck: Supple. No thyromegaly. Full ROM. No lymphadenopathy. Lungs: Few faint bilateral expiratory wheezes. Breathing is unlabored Heart: RRR with S1 S2. No murmurs, rubs, or gallops appreciated. Abdomen: Soft, non-tender, non-distended with normoactive bowel sounds. No hepatomegaly. No rebound/guarding. No obvious abdominal masses. Msk:  Strength and tone normal for age. Extremities/Skin: Warm and dry. No clubbing or cyanosis. No edema. Basal cell cancer on her nose measuring 3-4 mm with central ulceration and pearly border Neuro: Alert and oriented X 3. Moves all extremities spontaneously. Gait is normal. CNII-XII grossly in tact. Psych:  Responds to questions appropriately with a normal affect.    ASSESSMENT AND PLAN:  44 y.o. year old female with  This chart was scribed in my presence and reviewed  by me personally.    ICD-9-CM ICD-10-CM   1. Other back pain 724.5 M54.89 HYDROcodone-acetaminophen (NORCO) 7.5-325 MG per tablet  2. Chronic low back pain 724.2 M54.5 HYDROcodone-acetaminophen (NORCO) 7.5-325 MG per tablet   338.29 G89.29   3. Recurrent ethmoidal sinusitis, unspecified chronicity 461.2 J01.21 levofloxacin (LEVAQUIN) 500 MG tablet  4. Basal cell carcinoma 173.91 C44.91 Ambulatory referral to Plastic Surgery     Signed, Ann Haber, MD 12/23/2014 12:59 PM

## 2015-01-10 DIAGNOSIS — C50919 Malignant neoplasm of unspecified site of unspecified female breast: Secondary | ICD-10-CM | POA: Diagnosis not present

## 2015-01-20 ENCOUNTER — Ambulatory Visit (INDEPENDENT_AMBULATORY_CARE_PROVIDER_SITE_OTHER): Payer: Medicare Other | Admitting: Family Medicine

## 2015-01-20 VITALS — BP 100/64 | HR 72 | Temp 98.6°F | Resp 16 | Ht 61.0 in | Wt 191.8 lb

## 2015-01-20 DIAGNOSIS — M5489 Other dorsalgia: Secondary | ICD-10-CM

## 2015-01-20 DIAGNOSIS — H65191 Other acute nonsuppurative otitis media, right ear: Secondary | ICD-10-CM | POA: Diagnosis not present

## 2015-01-20 DIAGNOSIS — G8929 Other chronic pain: Secondary | ICD-10-CM

## 2015-01-20 DIAGNOSIS — Z23 Encounter for immunization: Secondary | ICD-10-CM | POA: Diagnosis not present

## 2015-01-20 DIAGNOSIS — J0121 Acute recurrent ethmoidal sinusitis: Secondary | ICD-10-CM | POA: Diagnosis not present

## 2015-01-20 DIAGNOSIS — M545 Low back pain: Secondary | ICD-10-CM

## 2015-01-20 MED ORDER — MOMETASONE FUROATE 50 MCG/ACT NA SUSP: 2.0000 | Freq: Every day | NASAL | Status: DC

## 2015-01-20 MED ORDER — HYDROCODONE-ACETAMINOPHEN 7.5-325 MG PO TABS: 1.0000 | ORAL_TABLET | Freq: Two times a day (BID) | ORAL | Status: DC | PRN

## 2015-01-20 MED ORDER — LEVOFLOXACIN 500 MG PO TABS: 500.0000 mg | ORAL_TABLET | Freq: Every day | ORAL | Status: DC

## 2015-01-20 NOTE — Patient Instructions (Addendum)
I am giving you a nasal spray that's a little bit different from the Atrovent. Hopefully nasal spray along with the pneumonia vaccine will help stop these recurrences.  Pneumococcal Vaccine, Polyvalent suspension for injection What is this medicine? PNEUMOCOCCAL VACCINE (NEU mo KOK al vak SEEN) is a vaccine used to prevent pneumococcus bacterial infections. These bacteria can cause serious infections like pneumonia, meningitis, and blood infections. This vaccine will lower your chance of getting pneumonia. If you do get pneumonia, it can make your symptoms milder and your illness shorter. This vaccine will not treat an infection and will not cause infection. This vaccine is recommended for infants and young children, adults with certain medical conditions, and adults 76 years or older. This medicine may be used for other purposes; ask your health care provider or pharmacist if you have questions. What should I tell my health care provider before I take this medicine? They need to know if you have any of these conditions: -bleeding problems -fever -immune system problems -an unusual or allergic reaction to pneumococcal vaccine, diphtheria toxoid, other vaccines, latex, other medicines, foods, dyes, or preservatives -pregnant or trying to get pregnant -breast-feeding How should I use this medicine? This vaccine is for injection into a muscle. It is given by a health care professional. A copy of Vaccine Information Statements will be given before each vaccination. Read this sheet carefully each time. The sheet may change frequently. Talk to your pediatrician regarding the use of this medicine in children. While this drug may be prescribed for children as young as 67 weeks old for selected conditions, precautions do apply. Overdosage: If you think you have taken too much of this medicine contact a poison control center or emergency room at once. NOTE: This medicine is only for you. Do not share this  medicine with others. What if I miss a dose? It is important not to miss your dose. Call your doctor or health care professional if you are unable to keep an appointment. What may interact with this medicine? -medicines for cancer chemotherapy -medicines that suppress your immune function -steroid medicines like prednisone or cortisone This list may not describe all possible interactions. Give your health care provider a list of all the medicines, herbs, non-prescription drugs, or dietary supplements you use. Also tell them if you smoke, drink alcohol, or use illegal drugs. Some items may interact with your medicine. What should I watch for while using this medicine? Mild fever and pain should go away in 3 days or less. Report any unusual symptoms to your doctor or health care professional. What side effects may I notice from receiving this medicine? Side effects that you should report to your doctor or health care professional as soon as possible: -allergic reactions like skin rash, itching or hives, swelling of the face, lips, or tongue -breathing problems -confused -fast or irregular heartbeat -fever over 102 degrees F -seizures -unusual bleeding or bruising -unusual muscle weakness Side effects that usually do not require medical attention (report to your doctor or health care professional if they continue or are bothersome): -aches and pains -diarrhea -fever of 102 degrees F or less -headache -irritable -loss of appetite -pain, tender at site where injected -trouble sleeping This list may not describe all possible side effects. Call your doctor for medical advice about side effects. You may report side effects to FDA at 1-800-FDA-1088. Where should I keep my medicine? This does not apply. This vaccine is given in a clinic, pharmacy, doctor's office, or other  health care setting and will not be stored at home. NOTE: This sheet is a summary. It may not cover all possible  information. If you have questions about this medicine, talk to your doctor, pharmacist, or health care provider.    2016, Elsevier/Gold Standard. (2013-12-31 10:27:27)

## 2015-01-20 NOTE — Progress Notes (Signed)
This chart was scribed for Robyn Haber, MD by Moises Blood, medical scribe at [facility].The patient was seen in exam room 11 and the patient's care was started at 1:29 PM.  Patient ID: Ann Lopez MRN: [id number], DOB: 04/27/1970, 44 y.o. Date of Encounter: 01/20/2015  Primary Physician: Robyn Haber, MD  Chief Complaint:  Chief Complaint  Patient presents with  . Cough    x 1 week  . Generalized Body Aches    x 1 week  . Ear Pain    right, x 1 week  . Shortness of Breath    x 1 week  . Back Pain    between shoulder baldes  . Sore Throat    today  . Medication Refill    HPI:  Ann Lopez is a 44 y.o. female who presents to [facility] complaining of multiple concerns.  She noticed a cough with shortness of breath, myalgia (general) with back pain, and right ear pain for a week now. She noticed a sore throat starting today. She reports that these symptoms have gotten worse in the last 2 days.   She also needs medication refill.   She is currently not working and is on disability.   Past Medical History  Diagnosis Date  . Diabetes mellitus   . Back pain   . Osteopenia   . Allergy   . Asthma   . Cancer ([place])   . Breast cancer ([place])   . Depression   . Anxiety   . Osteoporosis     osteopenia  . Headache   . Hyperlipemia      Home Meds: Prior to Admission medications   Medication Sig Start Date End Date Taking? Authorizing Provider  albuterol (PROVENTIL HFA;VENTOLIN HFA) 108 (90 BASE) MCG/ACT inhaler Inhale 1 puff into the lungs every 6 (six) hours as needed for wheezing or shortness of breath. 09/19/14  Yes Robyn Haber, MD  albuterol (PROVENTIL) (2.5 MG/3ML) 0.083% nebulizer solution Take 3 mLs (2.5 mg total) by nebulization every 6 (six) hours as needed for wheezing or shortness of breath. 9/41/74  Yes Delora Fuel, MD  canagliflozin [facility]) 300 MG TABS tablet Take 300 mg by mouth daily before breakfast.  09/19/14  Yes Robyn Haber, MD  Choline Fenofibrate 135 MG capsule Take 1 capsule (135 mg total) by mouth daily. 05/09/14  Yes Robyn Haber, MD  clonazePAM (KLONOPIN) 1 MG tablet Take 1 tablet (1 mg total) by mouth 2 (two) times daily as needed for anxiety. 09/19/14  Yes Robyn Haber, MD  desvenlafaxine (PRISTIQ) 100 MG 24 hr tablet Take 1 tablet (100 mg total) by mouth daily. 09/19/14  Yes Robyn Haber, MD  HYDROcodone-acetaminophen (NORCO) 7.5-325 MG per tablet Take 1 tablet by mouth 2 (two) times daily as needed for moderate pain. 12/23/14  Yes Robyn Haber, MD  ipratropium (ATROVENT) 0.03 % nasal spray Place 2 sprays into the nose 4 (four) times daily. 11/24/14  Yes Gay Filler Copland, MD  omeprazole (PRILOSEC) 20 MG capsule Take 1 capsule (20 mg total) by mouth daily. 09/19/14  Yes Robyn Haber, MD  topiramate (TOPAMAX) 15 MG capsule Take 15 mg by mouth 2 (two) times daily.   Yes Historical Provider, MD  levofloxacin (LEVAQUIN) 500 MG tablet Take 1 tablet (500 mg total) by mouth daily. Patient not taking: Reported on 01/20/2015 12/23/14   Robyn Haber, MD    Allergies:  Allergies  Allergen Reactions  . Azithromycin  Unknown  . Morphine And Related     Can tolerate hydrocodone and dilaudid without side effects  . Nitrofurantoin     Unknown  . Penicillins Hives and Itching  . Sulfonamide Derivatives Hives    Social History   Social History  . Marital Status: Single    Spouse Name: N/A  . Number of Children: 4  . Years of Education: 13   Occupational History  . Disabled    Social History Main Topics  . Smoking status: Former Smoker    Types: Cigarettes    Quit date: 04/21/2007  . Smokeless tobacco: Never Used  . Alcohol Use: No  . Drug Use: No  . Sexual Activity: Not Currently   Other Topics Concern  . Not on file   Social History Narrative   Lives at home with her four sons.   Right-handed.   2-3 cups caffeine daily.     Review of  Systems: Constitutional: negative for chills, fever, night sweats, weight changes, or fatigue  HEENT: negative for vision changes, hearing loss, congestion, rhinorrhea, epistaxis, or sinus pressure; positive for ear pain (right), sore throat Cardiovascular: negative for chest pain or palpitations Respiratory: negative for hemoptysis, wheezing; positive for cough, shortness of breath Abdominal: negative for abdominal pain, nausea, vomiting, diarrhea, or constipation Dermatological: negative for rash Neurologic: negative for headache, dizziness, or syncope Musc: positive for myalgia (general body), back pain All other systems reviewed and are otherwise negative with the exception to those above and in the HPI.  Physical Exam: Blood pressure 100/64, pulse 72, temperature 98.6 F (37 C), temperature source Oral, resp. rate 16, height 5\' 1"  (1.549 m), weight 191 lb 12.8 oz (87 kg), SpO2 98 %., Body mass index is 36.26 kg/(m^2). General: Well developed, well nourished, in no acute distress. Head: Normocephalic, atraumatic, eyes without discharge, sclera non-icteric, nares are without discharge. Oral cavity moist, posterior pharynx without exudate, erythema, peritonsillar abscess, or post nasal drip. Right eardrum retracted, throat moderately erythematous Neck: Supple. No thyromegaly. Full ROM. No lymphadenopathy. Lungs: Clear bilaterally to auscultation without wheezes, rales, or rhonchi. Breathing is unlabored. Heart: RRR with S1 S2. No murmurs, rubs, or gallops appreciated. Abdomen: Soft, non-tender, non-distended with normoactive bowel sounds. No hepatomegaly. No rebound/guarding. No obvious abdominal masses. Msk:  Strength and tone normal for age. Extremities/Skin: Warm and dry. No clubbing or cyanosis. No edema. No rashes or suspicious lesions. Neuro: Alert and oriented X 3. Moves all extremities spontaneously. Gait is normal. CNII-XII grossly in tact. Psych:  Responds to questions  appropriately with a normal affect.     ASSESSMENT AND PLAN:  44 y.o. year old female with  This chart was scribed in my presence and reviewed by me personally.    ICD-9-CM ICD-10-CM   1. Need for prophylactic vaccination against Streptococcus pneumoniae (pneumococcus) V03.82 Z23 Pneumococcal conjugate vaccine 13-valent IM  2. Subacute nonsuppurative otitis media of right ear 381.00 H65.191   3. Recurrent ethmoidal sinusitis, unspecified chronicity 461.2 J01.21 levofloxacin (LEVAQUIN) 500 MG tablet     mometasone (NASONEX) 50 MCG/ACT nasal spray  4. Other back pain 724.5 M54.89 HYDROcodone-acetaminophen (NORCO) 7.5-325 MG tablet  5. Chronic low back pain 724.2 M54.5 HYDROcodone-acetaminophen (NORCO) 7.5-325 MG tablet   338.29 G89.29       By signing my name below, I, Moises Blood, attest that this documentation has been prepared under the direction and in the presence of Robyn Haber, MD. Electronically Signed: Moises Blood, Scribe. 01/20/2015 , 1:29 PM .  Signed, Synetta Shadow  Rona Tomson, MD 01/20/2015 1:29 PM

## 2015-01-24 DIAGNOSIS — C50912 Malignant neoplasm of unspecified site of left female breast: Secondary | ICD-10-CM | POA: Diagnosis not present

## 2015-01-25 ENCOUNTER — Ambulatory Visit: Payer: Self-pay | Admitting: Neurology

## 2015-02-01 ENCOUNTER — Ambulatory Visit: Payer: Self-pay | Admitting: Neurology

## 2015-02-04 ENCOUNTER — Emergency Department (HOSPITAL_COMMUNITY): Payer: Medicare Other

## 2015-02-04 ENCOUNTER — Encounter (HOSPITAL_COMMUNITY): Payer: Self-pay | Admitting: *Deleted

## 2015-02-04 ENCOUNTER — Emergency Department (HOSPITAL_COMMUNITY)
Admission: EM | Admit: 2015-02-04 | Discharge: 2015-02-04 | Disposition: A | Discharge status: Home / Self Care | Payer: Medicare Other | Attending: Emergency Medicine | Admitting: Emergency Medicine

## 2015-02-04 DIAGNOSIS — Z880 Allergy status to penicillin: Secondary | ICD-10-CM | POA: Diagnosis not present

## 2015-02-04 DIAGNOSIS — F329 Major depressive disorder, single episode, unspecified: Secondary | ICD-10-CM | POA: Diagnosis not present

## 2015-02-04 DIAGNOSIS — Z8739 Personal history of other diseases of the musculoskeletal system and connective tissue: Secondary | ICD-10-CM | POA: Insufficient documentation

## 2015-02-04 DIAGNOSIS — R0981 Nasal congestion: Secondary | ICD-10-CM | POA: Diagnosis not present

## 2015-02-04 DIAGNOSIS — Z853 Personal history of malignant neoplasm of breast: Secondary | ICD-10-CM | POA: Diagnosis not present

## 2015-02-04 DIAGNOSIS — R630 Anorexia: Secondary | ICD-10-CM | POA: Diagnosis not present

## 2015-02-04 DIAGNOSIS — J45901 Unspecified asthma with (acute) exacerbation: Secondary | ICD-10-CM | POA: Insufficient documentation

## 2015-02-04 DIAGNOSIS — R0602 Shortness of breath: Secondary | ICD-10-CM | POA: Diagnosis not present

## 2015-02-04 DIAGNOSIS — R0789 Other chest pain: Secondary | ICD-10-CM | POA: Diagnosis not present

## 2015-02-04 DIAGNOSIS — Z79899 Other long term (current) drug therapy: Secondary | ICD-10-CM | POA: Diagnosis not present

## 2015-02-04 DIAGNOSIS — Z3202 Encounter for pregnancy test, result negative: Secondary | ICD-10-CM | POA: Diagnosis not present

## 2015-02-04 DIAGNOSIS — J3489 Other specified disorders of nose and nasal sinuses: Secondary | ICD-10-CM | POA: Diagnosis not present

## 2015-02-04 DIAGNOSIS — F419 Anxiety disorder, unspecified: Secondary | ICD-10-CM | POA: Insufficient documentation

## 2015-02-04 DIAGNOSIS — E119 Type 2 diabetes mellitus without complications: Secondary | ICD-10-CM | POA: Diagnosis not present

## 2015-02-04 DIAGNOSIS — Z720 Tobacco use: Secondary | ICD-10-CM | POA: Insufficient documentation

## 2015-02-04 DIAGNOSIS — F17210 Nicotine dependence, cigarettes, uncomplicated: Secondary | ICD-10-CM | POA: Diagnosis not present

## 2015-02-04 DIAGNOSIS — R079 Chest pain, unspecified: Secondary | ICD-10-CM | POA: Diagnosis not present

## 2015-02-04 LAB — URINALYSIS, ROUTINE W REFLEX MICROSCOPIC
BILIRUBIN URINE: NEGATIVE
Glucose, UA: >1000 mg/dL — AB
HGB URINE DIPSTICK: NEGATIVE
Ketones, ur: NEGATIVE mg/dL
Leukocytes, UA: NEGATIVE
Nitrite: NEGATIVE
PROTEIN: NEGATIVE mg/dL
SPECIFIC GRAVITY, URINE: 1.01 (ref 1.005–1.030)
UROBILINOGEN UA: 0.2 mg/dL (ref 0.0–1.0)
pH: 7 (ref 5.0–8.0)

## 2015-02-04 LAB — COMPREHENSIVE METABOLIC PANEL
ALT: 21 U/L (ref 14–54)
ANION GAP: 12 (ref 5–15)
AST: <5 U/L — ABNORMAL LOW (ref 15–41)
Albumin: 4.3 g/dL (ref 3.5–5.0)
Alkaline Phosphatase: 76 U/L (ref 38–126)
BUN: 10 mg/dL (ref 6–20)
CHLORIDE: 101 mmol/L (ref 101–111)
CO2: 22 mmol/L (ref 22–32)
Calcium: 9.3 mg/dL (ref 8.9–10.3)
Creatinine, Ser: 0.68 mg/dL (ref 0.44–1.00)
GFR calc non Af Amer: >60 mL/min (ref >60)
Glucose, Bld: 335 mg/dL — ABNORMAL HIGH (ref 65–99)
Potassium: 4 mmol/L (ref 3.5–5.1)
SODIUM: 135 mmol/L (ref 135–145)
Total Bilirubin: 0.7 mg/dL (ref 0.3–1.2)
Total Protein: 8.3 g/dL — ABNORMAL HIGH (ref 6.5–8.1)

## 2015-02-04 LAB — URINE MICROSCOPIC-ADD ON

## 2015-02-04 LAB — CBC WITH DIFFERENTIAL/PLATELET
BASOS PCT: 0 %
Basophils Absolute: 0.1 10*3/uL (ref 0.0–0.1)
EOS ABS: 0.2 10*3/uL (ref 0.0–0.7)
EOS PCT: 1 %
HCT: 46.9 % — ABNORMAL HIGH (ref 36.0–46.0)
Hemoglobin: 16.1 g/dL — ABNORMAL HIGH (ref 12.0–15.0)
LYMPHS ABS: 3 10*3/uL (ref 0.7–4.0)
Lymphocytes Relative: 25 %
MCH: 29.8 pg (ref 26.0–34.0)
MCHC: 34.3 g/dL (ref 30.0–36.0)
MCV: 86.7 fL (ref 78.0–100.0)
MONOS PCT: 6 %
Monocytes Absolute: 0.7 10*3/uL (ref 0.1–1.0)
Neutro Abs: 8.1 10*3/uL — ABNORMAL HIGH (ref 1.7–7.7)
Neutrophils Relative %: 68 %
PLATELETS: 217 10*3/uL (ref 150–400)
RBC: 5.41 MIL/uL — AB (ref 3.87–5.11)
RDW: 14.1 % (ref 11.5–15.5)
WBC: 11.9 10*3/uL — AB (ref 4.0–10.5)

## 2015-02-04 LAB — D-DIMER, QUANTITATIVE (NOT AT ARMC): D DIMER QUANT: 0.29 ug{FEU}/mL (ref 0.00–0.48)

## 2015-02-04 LAB — RAPID STREP SCREEN (MED CTR MEBANE ONLY): STREPTOCOCCUS, GROUP A SCREEN (DIRECT): NEGATIVE

## 2015-02-04 LAB — TROPONIN I: Troponin I: <0.03 ng/mL (ref <0.031)

## 2015-02-04 LAB — PREGNANCY, URINE: PREG TEST UR: NEGATIVE

## 2015-02-04 MED ORDER — IOHEXOL 350 MG/ML SOLN
100.0000 mL | Freq: Once | INTRAVENOUS | Status: AC | PRN
  Administered 2015-02-04: 100 mL via INTRAVENOUS

## 2015-02-04 MED ORDER — NAPROXEN 500 MG PO TABS: 500.0000 mg | ORAL_TABLET | Freq: Two times a day (BID) | ORAL | Status: DC

## 2015-02-04 MED ORDER — KETOROLAC TROMETHAMINE 30 MG/ML IJ SOLN
30.0000 mg | Freq: Once | INTRAMUSCULAR | Status: AC
  Administered 2015-02-04: 30 mg via INTRAVENOUS
  Filled 2015-02-04: qty 1

## 2015-02-04 NOTE — ED Provider Notes (Signed)
CSN: [id number]     Arrival date & time 02/04/15  1200 History   First MD Initiated Contact with Patient 02/04/15 1210     Chief Complaint  Patient presents with  . Chest Pain     (Consider location/radiation/quality/duration/timing/severity/associated sxs/prior Treatment) HPI Comments: Patient presents with pain to her left upper back, shoulder blade and chest that onset this morning when she woke up around 7:30. The pain is constant and waxes and wanes in intensity never goes away. It is not worse with palpation. She feels somewhat better lying down. She's never had this pain before. She states she's been sick with respiratory symptoms for the past several weeks and saw her doctor who gave her Levaquin which she completed. She's had a dry cough and sore throat since then. Denies any headache or fever. She denies having the pain on the chest and back when she went to see her doctor. Denies any history of heart problems. She has a remote history of breast cancer. She also has a history of diabetes and asthma. She's had a cough productive of clear mucus. No abdominal pain, nausea or vomiting.  The history is provided by the patient and a relative.    Past Medical History  Diagnosis Date  . Diabetes mellitus   . Back pain   . Osteopenia   . Allergy   . Asthma   . Cancer ([place])   . Breast cancer ([place])   . Depression   . Anxiety   . Osteoporosis     osteopenia  . Headache   . Hyperlipemia    Past Surgical History  Procedure Laterality Date  . Mastectomy Bilateral   . Abdominal hysterectomy    . Lymphadenectomy    . Cesarean section    . Breast surgery    . Incision and drainage abscess Left 11/14/2012    Procedure: INCISION AND DRAINAGE ABSCESS LEFT GROIN;  Surgeon: Jamesetta So, MD;  Location: [facility];  Service: General;  Laterality: Left;   Family History  Problem Relation Age of Onset  . Cancer Mother 59    breast cancer  . Hypertension Sister   . Hypothyroidism Brother    . Cancer Maternal Grandmother 28    breast cancer  . Cancer Maternal Grandfather 18    pancreatic cancer   Social History  Substance Use Topics  . Smoking status: Current Some Day Smoker    Types: Cigarettes  . Smokeless tobacco: Never Used  . Alcohol Use: No   OB History    No data available     Review of Systems  Constitutional: Positive for activity change, appetite change and fatigue. Negative for fever.  HENT: Positive for congestion and rhinorrhea.   Eyes: Negative for visual disturbance.  Respiratory: Positive for cough, chest tightness and shortness of breath.   Cardiovascular: Positive for chest pain. Negative for leg swelling.  Gastrointestinal: Negative for nausea, vomiting and abdominal pain.  Genitourinary: Negative for dysuria, vaginal bleeding and vaginal discharge.  Musculoskeletal: Negative for myalgias and arthralgias.  Skin: Negative for rash.  Neurological: Negative for dizziness, weakness and headaches.  A complete 10 system review of systems was obtained and all systems are negative except as noted in the HPI and PMH.      Allergies  Azithromycin; Morphine and related; Nitrofurantoin; Penicillins; and Sulfonamide derivatives  Home Medications   Prior to Admission medications   Medication Sig Start Date End Date Taking? Authorizing Provider  albuterol (PROVENTIL HFA;VENTOLIN HFA) 108 (90 BASE)  MCG/ACT inhaler Inhale 1 puff into the lungs every 6 (six) hours as needed for wheezing or shortness of breath. 09/19/14  Yes Robyn Haber, MD  albuterol (PROVENTIL) (2.5 MG/3ML) 0.083% nebulizer solution Take 3 mLs (2.5 mg total) by nebulization every 6 (six) hours as needed for wheezing or shortness of breath. 07/27/35  Yes Delora Fuel, MD  canagliflozin [facility] 300 MG TABS tablet Take 300 mg by mouth daily before breakfast. 09/19/14  Yes Robyn Haber, MD  Choline Fenofibrate 135 MG capsule Take 1 capsule (135 mg total) by mouth daily. 05/09/14  Yes Robyn Haber, MD  clonazePAM (KLONOPIN) 1 MG tablet Take 1 tablet (1 mg total) by mouth 2 (two) times daily as needed for anxiety. 09/19/14  Yes Robyn Haber, MD  desvenlafaxine (PRISTIQ) 100 MG 24 hr tablet Take 1 tablet (100 mg total) by mouth daily. 09/19/14  Yes Robyn Haber, MD  HYDROcodone-acetaminophen (NORCO) 7.5-325 MG tablet Take 1 tablet by mouth 2 (two) times daily as needed for moderate pain. 01/20/15  Yes Robyn Haber, MD  mometasone (NASONEX) 50 MCG/ACT nasal spray Place 2 sprays into the nose daily. 01/20/15  Yes Robyn Haber, MD  omeprazole (PRILOSEC) 20 MG capsule Take 1 capsule (20 mg total) by mouth daily. 09/19/14  Yes Robyn Haber, MD  topiramate (TOPAMAX) 15 MG capsule Take 15 mg by mouth 2 (two) times daily.   Yes Historical Provider, MD  levofloxacin (LEVAQUIN) 500 MG tablet Take 1 tablet (500 mg total) by mouth daily. Patient not taking: Reported on 02/04/2015 01/20/15   Robyn Haber, MD  naproxen (NAPROSYN) 500 MG tablet Take 1 tablet (500 mg total) by mouth 2 (two) times daily. 02/04/15   Ezequiel Essex, MD   BP 113/74 mmHg  Pulse 88  Temp(Src) 97.6 F (36.4 C) (Oral)  Resp 18  Ht 5\' 2"  (1.575 m)  Wt 185 lb (83.915 kg)  BMI 33.83 kg/m2  SpO2 97% Physical Exam  Constitutional: She is oriented to person, place, and time. She appears well-developed and well-nourished. No distress.  HENT:  Head: Normocephalic and atraumatic.  Mouth/Throat: Oropharynx is clear and moist. No oropharyngeal exudate.  Eyes: Conjunctivae and EOM are normal. Pupils are equal, round, and reactive to light.  Neck: Normal range of motion. Neck supple.  No meningismus.  Cardiovascular: Normal rate, regular rhythm, normal heart sounds and intact distal pulses.   No murmur heard. Pulmonary/Chest: Effort normal and breath sounds normal. No respiratory distress. She exhibits no tenderness.  Patient states there is pain to her left scapula, left ribs and upper chest. This is not  reproducible. It is not worse with arm movement. There is no rash.  Abdominal: Soft. There is no tenderness. There is no rebound and no guarding.  Musculoskeletal: Normal range of motion. She exhibits no edema or tenderness.  Neurological: She is alert and oriented to person, place, and time. No cranial nerve deficit. She exhibits normal muscle tone. Coordination normal.  No ataxia on finger to nose bilaterally. No pronator drift. 5/5 strength throughout. CN 2-12 intact. Negative Romberg. Equal grip strength. Sensation intact. Gait is normal.   Skin: Skin is warm.  Psychiatric: She has a normal mood and affect. Her behavior is normal.  Nursing note and vitals reviewed.   ED Course  Procedures (including critical care time) Labs Review Labs Reviewed  CBC WITH DIFFERENTIAL/PLATELET - Abnormal; Notable for the following:    WBC 11.9 (*)    RBC 5.41 (*)    Hemoglobin 16.1 (*)  HCT 46.9 (*)    Neutro Abs 8.1 (*)    All other components within normal limits  COMPREHENSIVE METABOLIC PANEL - Abnormal; Notable for the following:    Glucose, Bld 335 (*)    Total Protein 8.3 (*)    AST <5 (*)    All other components within normal limits  URINALYSIS, ROUTINE W REFLEX MICROSCOPIC (NOT AT [facility]) - Abnormal; Notable for the following:    Glucose, UA >1000 (*)    All other components within normal limits  RAPID STREP SCREEN (NOT AT [facility])  CULTURE, GROUP A STREP  TROPONIN I  D-DIMER, QUANTITATIVE (NOT AT [facility])  PREGNANCY, URINE  URINE MICROSCOPIC-ADD ON  TROPONIN I    Imaging Review Dg Chest 2 View  02/04/2015  CLINICAL DATA:  Chest pain EXAM: CHEST  2 VIEW COMPARISON:  03/10/2014 FINDINGS: The heart size and mediastinal contours are within normal limits. Both lungs are clear. The visualized skeletal structures are unremarkable. IMPRESSION: No active cardiopulmonary disease. Electronically Signed   By: Kerby Moors M.D.   On: 02/04/2015 14:04   Ct Angio Chest Pe W/cm &/or Wo  Cm  02/04/2015  CLINICAL DATA:  Left-sided chest pain. History of upper respiratory infection for 2 weeks and sore throat. EXAM: CT ANGIOGRAPHY CHEST WITH CONTRAST TECHNIQUE: Multidetector CT imaging of the chest was performed using the standard protocol during bolus administration of intravenous contrast. Multiplanar CT image reconstructions and MIPs were obtained to evaluate the vascular anatomy. CONTRAST:  151mL OMNIPAQUE IOHEXOL 350 MG/ML SOLN COMPARISON:  Chest CT 10/02/2010 FINDINGS: Negative for pulmonary embolism. Normal caliber of the thoracic aorta without aneurysm and no evidence for dissection. There is a bovine type arch. Proximal great vessels are patent. No significant pericardial or pleural fluid. There is no evidence for chest lymphadenopathy. Evidence for bilateral mastectomies. Images of the upper abdomen are unremarkable. The trachea and mainstem bronchi are patent. Linear densities along the medial right upper lobe are suggestive for atelectasis or scarring. Hazy ground-glass densities in superior segment of the right lower lobe probably represent atelectasis. Mild haziness in the left lower lobe and dependent aspect of the left upper lobe are suggestive for volume loss. No large areas of airspace disease or consolidation. No suspicious bony lesions. Review of the MIP images confirms the above findings. IMPRESSION: Negative for pulmonary embolism. Subtle ground-glass densities in both lungs with linear densities in the right upper lobe. Findings are most compatible with areas of volume loss. No significant airspace disease or consolidation. Electronically Signed   By: Markus Daft M.D.   On: 02/04/2015 14:53   I have personally reviewed and evaluated these images and lab results as part of my medical decision-making.   EKG Interpretation   Date/Time:  Friday February 04 2015 12:12:08 EDT Ventricular Rate:  78 PR Interval:  153 QRS Duration: 88 QT Interval:  381 QTC Calculation: 434 R  Axis:   63 Text Interpretation:  Sinus rhythm Probable anteroseptal infarct, old  Baseline wander in lead(s) V1 No significant change was found Confirmed by  Wyvonnia Dusky  MD, Letta Cargile [phone]) on 02/04/2015 12:38:42 PM      MDM   Final diagnoses:  Atypical chest pain   Left upper back and chest pain constant for the past 5 hours. EKG normal rhythm. Septal Q waves are stable. Remote history of breast cancer so pulmonary embolism is considered but she is not hypoxic or tachycardic.  Low suspicion for ACS given ongoing pain for the past 5 hours but  we'll check enzymes. Final negative. D-dimer negative. Chest x-ray negative.  Given patient's history of cancer we'll check CT scan to evaluate for pulmonary embolism. CT is negative for aortic dissection or pulmonary embolism. No airspace disease.  Patient able to her without desaturation. Suspect musculoskeletal chest pain. Is atypical for ACS as he has been ongoing for the past 5 hours and troponin is negative. Pain is somewhat worse with sitting up and movement. It is not reproducible worse with arm movement.  Second troponin pending at time of sign out. Dr. Jeneen Rinks to discharge.  I personally performed the services described in this documentation, which was scribed in my presence. The recorded information has been reviewed and is accurate.     Ezequiel Essex, MD 02/04/15 1620

## 2015-02-04 NOTE — ED Notes (Signed)
Patient upset over not receiving a prescription for a  narcotic for pain control. Rationale for Naprosyn explained.

## 2015-02-04 NOTE — ED Notes (Signed)
Pt states pain to left upper chest and left shoulder blade began this morning. Pt states she has been sick with resp symptoms x 2 weeks with sore throat starting yesterday as well. NAD.

## 2015-02-04 NOTE — Discharge Instructions (Signed)
Nonspecific Chest Pain  There is no evidence of heart attack or blood clot in the lung. Follow up with your doctor. Return to the eD if you develop new or worsening symptoms. Chest pain can be caused by many different conditions. There is always a chance that your pain could be related to something serious, such as a heart attack or a blood clot in your lungs. Chest pain can also be caused by conditions that are not life-threatening. If you have chest pain, it is very important to follow up with your health care provider. CAUSES  Chest pain can be caused by:  Heartburn.  Pneumonia or bronchitis.  Anxiety or stress.  Inflammation around your heart (pericarditis) or lung (pleuritis or pleurisy).  A blood clot in your lung.  A collapsed lung (pneumothorax). It can develop suddenly on its own (spontaneous pneumothorax) or from trauma to the chest.  Shingles infection (varicella-zoster virus).  Heart attack.  Damage to the bones, muscles, and cartilage that make up your chest wall. This can include:  Bruised bones due to injury.  Strained muscles or cartilage due to frequent or repeated coughing or overwork.  Fracture to one or more ribs.  Sore cartilage due to inflammation (costochondritis). RISK FACTORS  Risk factors for chest pain may include:  Activities that increase your risk for trauma or injury to your chest.  Respiratory infections or conditions that cause frequent coughing.  Medical conditions or overeating that can cause heartburn.  Heart disease or family history of heart disease.  Conditions or health behaviors that increase your risk of developing a blood clot.  Having had chicken pox (varicella zoster). SIGNS AND SYMPTOMS Chest pain can feel like:  Burning or tingling on the surface of your chest or deep in your chest.  Crushing, pressure, aching, or squeezing pain.  Dull or sharp pain that is worse when you move, cough, or take a deep breath.  Pain  that is also felt in your back, neck, shoulder, or arm, or pain that spreads to any of these areas. Your chest pain may come and go, or it may stay constant. DIAGNOSIS Lab tests or other studies may be needed to find the cause of your pain. Your health care provider may have you take a test called an ambulatory ECG (electrocardiogram). An ECG records your heartbeat patterns at the time the test is performed. You may also have other tests, such as:  Transthoracic echocardiogram (TTE). During echocardiography, sound waves are used to create a picture of all of the heart structures and to look at how blood flows through your heart.  Transesophageal echocardiogram (TEE).This is a more advanced imaging test that obtains images from inside your body. It allows your health care provider to see your heart in finer detail.  Cardiac monitoring. This allows your health care provider to monitor your heart rate and rhythm in real time.  Holter monitor. This is a portable device that records your heartbeat and can help to diagnose abnormal heartbeats. It allows your health care provider to track your heart activity for several days, if needed.  Stress tests. These can be done through exercise or by taking medicine that makes your heart beat more quickly.  Blood tests.  Imaging tests. TREATMENT  Your treatment depends on what is causing your chest pain. Treatment may include:  Medicines. These may include:  Acid blockers for heartburn.  Anti-inflammatory medicine.  Pain medicine for inflammatory conditions.  Antibiotic medicine, if an infection is present.  Medicines  to dissolve blood clots.  Medicines to treat coronary artery disease.  Supportive care for conditions that do not require medicines. This may include:  Resting.  Applying heat or cold packs to injured areas.  Limiting activities until pain decreases. HOME CARE INSTRUCTIONS  If you were prescribed an antibiotic medicine,  finish it all even if you start to feel better.  Avoid any activities that bring on chest pain.  Do not use any tobacco products, including cigarettes, chewing tobacco, or electronic cigarettes. If you need help quitting, ask your health care provider.  Do not drink alcohol.  Take medicines only as directed by your health care provider.  Keep all follow-up visits as directed by your health care provider. This is important. This includes any further testing if your chest pain does not go away.  If heartburn is the cause for your chest pain, you may be told to keep your head raised (elevated) while sleeping. This reduces the chance that acid will go from your stomach into your esophagus.  Make lifestyle changes as directed by your health care provider. These may include:  Getting regular exercise. Ask your health care provider to suggest some activities that are safe for you.  Eating a heart-healthy diet. A registered dietitian can help you to learn healthy eating options.  Maintaining a healthy weight.  Managing diabetes, if necessary.  Reducing stress. SEEK MEDICAL CARE IF:  Your chest pain does not go away after treatment.  You have a rash with blisters on your chest.  You have a fever. SEEK IMMEDIATE MEDICAL CARE IF:   Your chest pain is worse.  You have an increasing cough, or you cough up blood.  You have severe abdominal pain.  You have severe weakness.  You faint.  You have chills.  You have sudden, unexplained chest discomfort.  You have sudden, unexplained discomfort in your arms, back, neck, or jaw.  You have shortness of breath at any time.  You suddenly start to sweat, or your skin gets clammy.  You feel nauseous or you vomit.  You suddenly feel light-headed or dizzy.  Your heart begins to beat quickly, or it feels like it is skipping beats. These symptoms may represent a serious problem that is an emergency. Do not wait to see if the symptoms  will go away. Get medical help right away. Call your local emergency services (911 in the [place] Do not drive yourself to the hospital.   This information is not intended to replace advice given to you by your health care provider. Make sure you discuss any questions you have with your health care provider.   Document Released: 01/03/2005 Document Revised: 04/16/2014 Document Reviewed: 10/30/2013 Elsevier Interactive Patient Education [employer].

## 2015-02-04 NOTE — ED Notes (Signed)
Ambulated around nursing station once. No complaints stated she felt "fine". O2 95%

## 2015-02-07 LAB — CULTURE, GROUP A STREP: Strep A Culture: NEGATIVE

## 2015-02-15 ENCOUNTER — Telehealth: Payer: Self-pay | Admitting: Family Medicine

## 2015-02-15 NOTE — Telephone Encounter (Signed)
Spoke with patient and she is coming in on Wed November 16 for a hospital follow up with Dr. Lorelei Pont and then she is coming in January for a complete physical with Dr. Lorelei Pont.

## 2015-02-18 ENCOUNTER — Ambulatory Visit (INDEPENDENT_AMBULATORY_CARE_PROVIDER_SITE_OTHER): Payer: Medicare Other | Admitting: Family Medicine

## 2015-02-18 VITALS — BP 117/82 | HR 92 | Temp 99.0°F | Resp 20 | Ht 62.0 in | Wt 188.0 lb

## 2015-02-18 DIAGNOSIS — K0889 Other specified disorders of teeth and supporting structures: Secondary | ICD-10-CM | POA: Diagnosis not present

## 2015-02-18 DIAGNOSIS — J0121 Acute recurrent ethmoidal sinusitis: Secondary | ICD-10-CM

## 2015-02-18 DIAGNOSIS — M545 Low back pain: Secondary | ICD-10-CM

## 2015-02-18 DIAGNOSIS — M5489 Other dorsalgia: Secondary | ICD-10-CM

## 2015-02-18 DIAGNOSIS — G8929 Other chronic pain: Secondary | ICD-10-CM

## 2015-02-18 MED ORDER — HYDROCODONE-ACETAMINOPHEN 7.5-325 MG PO TABS: 1.0000 | ORAL_TABLET | Freq: Two times a day (BID) | ORAL | Status: DC | PRN

## 2015-02-18 MED ORDER — LEVOFLOXACIN 500 MG PO TABS: 500.0000 mg | ORAL_TABLET | Freq: Every day | ORAL | Status: DC

## 2015-02-18 NOTE — Patient Instructions (Signed)
I want you to keep your dental appointment next week and discuss the best way to proceed with her teeth.

## 2015-02-18 NOTE — Progress Notes (Signed)
@UMFCLOGO @  This chart was scribed for Robyn Haber, MD by Thea Alken, ED Scribe. This patient was seen in room 2 and the patient's care was started at 4:02 PM.  Patient ID: Ann Lopez MRN: [id number], DOB: 10/04/1970, 44 y.o. Date of Encounter: 02/18/2015, 4:02 PM  Primary Physician: Robyn Haber, MD  Chief Complaint:  Chief Complaint  Patient presents with   Jaw Pain    swollen    Cough    HPI: 44 y.o. year old female with history below presents with gradually worsening, right lower dental pain for 4 days. She also has jaw pain and swelling and right neck pain. She has seen a dentist in the past and was supposed to have tooth extracted but instead was prescribed abx with temporary relief. She tried to make an appointment with her dentist but is not able to be seen for another week. She cannot afford a root canal. She has taken hydrocodone for the pain but has ran out. She has multiple drug allergies but is able to take Levaquin.   Pt is disable and cannot work.  Past Medical History  Diagnosis Date   Diabetes mellitus    Back pain    Osteopenia    Allergy    Asthma    Cancer ([place])    Breast cancer ([place])    Depression    Anxiety    Osteoporosis     osteopenia   Headache    Hyperlipemia      Home Meds: Prior to Admission medications   Medication Sig Start Date End Date Taking? Authorizing Provider  albuterol (PROVENTIL HFA;VENTOLIN HFA) 108 (90 BASE) MCG/ACT inhaler Inhale 1 puff into the lungs every 6 (six) hours as needed for wheezing or shortness of breath. 09/19/14  Yes Robyn Haber, MD  albuterol (PROVENTIL) (2.5 MG/3ML) 0.083% nebulizer solution Take 3 mLs (2.5 mg total) by nebulization every 6 (six) hours as needed for wheezing or shortness of breath. [id number]  Yes Delora Fuel, MD  canagliflozin [facility]) 300 MG TABS tablet Take 300 mg by mouth daily before breakfast. 09/19/14  Yes Robyn Haber, MD  Choline Fenofibrate 135 MG capsule  Take 1 capsule (135 mg total) by mouth daily. 05/09/14  Yes Robyn Haber, MD  clonazePAM (KLONOPIN) 1 MG tablet Take 1 tablet (1 mg total) by mouth 2 (two) times daily as needed for anxiety. 09/19/14  Yes Robyn Haber, MD  desvenlafaxine (PRISTIQ) 100 MG 24 hr tablet Take 1 tablet (100 mg total) by mouth daily. 09/19/14  Yes Robyn Haber, MD  HYDROcodone-acetaminophen (NORCO) 7.5-325 MG tablet Take 1 tablet by mouth 2 (two) times daily as needed for moderate pain. 01/20/15  Yes Robyn Haber, MD  mometasone (NASONEX) 50 MCG/ACT nasal spray Place 2 sprays into the nose daily. 01/20/15  Yes Robyn Haber, MD  naproxen (NAPROSYN) 500 MG tablet Take 1 tablet (500 mg total) by mouth 2 (two) times daily. 02/04/15  Yes Ezequiel Essex, MD  omeprazole (PRILOSEC) 20 MG capsule Take 1 capsule (20 mg total) by mouth daily. 09/19/14  Yes Robyn Haber, MD  topiramate (TOPAMAX) 15 MG capsule Take 15 mg by mouth 2 (two) times daily.   Yes Historical Provider, MD  levofloxacin (LEVAQUIN) 500 MG tablet Take 1 tablet (500 mg total) by mouth daily. Patient not taking: Reported on 02/04/2015 01/20/15   Robyn Haber, MD    Allergies:  Allergies  Allergen Reactions   Azithromycin     Unknown   Morphine And Related  Can tolerate hydrocodone and dilaudid without side effects   Nitrofurantoin     Unknown   Penicillins Hives and Itching   Sulfonamide Derivatives Hives    Social History   Social History   Marital Status: Single    Spouse Name: N/A   Number of Children: 4   Years of Education: 67   Occupational History   Disabled    Social History Main Topics   Smoking status: Current Some Day Smoker    Types: Cigarettes   Smokeless tobacco: Never Used   Alcohol Use: No   Drug Use: No   Sexual Activity: Not Currently   Other Topics Concern   Not on file   Social History Narrative   Lives at home with her four sons.   Right-handed.   2-3 cups caffeine daily.      Review of Systems: Constitutional: negative for chills, fever, night sweats, weight changes, or fatigue  HEENT: negative for vision changes, hearing loss, congestion, rhinorrhea, ST, epistaxis, or sinus pressure Cardiovascular: negative for chest pain or palpitations Respiratory: negative for hemoptysis, wheezing, shortness of breath, or cough Abdominal: negative for abdominal pain, nausea, vomiting, diarrhea, or constipation Dermatological: negative for rash Neurologic: negative for headache, dizziness, or syncope All other systems reviewed and are otherwise negative with the exception to those above and in the HPI.   Physical Exam: Blood pressure 117/82, pulse 92, temperature 99 F (37.2 C), temperature source Oral, resp. rate 20, height 5\' 2"  (1.575 m), weight 188 lb (85.276 kg), SpO2 97 %., Body mass index is 34.38 kg/(m^2). General: Well developed, well nourished, in no acute distress. Head: Normocephalic, atraumatic, eyes without discharge, sclera non-icteric, nares are without discharge. Bilateral auditory canals clear, TM's are without perforation, pearly grey and translucent with reflective cone of light bilaterally. Oral cavity moist, posterior pharynx without exudate, erythema, peritonsillar abscess, or post nasal drip.  Msk:  Strength and tone normal for age. Extremities/Skin: Warm and dry. No clubbing or cyanosis. No edema. No rashes or suspicious lesions. Neuro: Alert and oriented X 3. Moves all extremities spontaneously. Gait is normal. CNII-XII grossly in tact. Psych:  Responds to questions appropriately with a normal affect.    ASSESSMENT AND PLAN:  44 y.o. year old female with     ICD-9-CM ICD-10-CM   1. Other back pain 724.5 M54.89   2. Chronic low back pain 724.2 M54.5    338.29 G89.29   3. Recurrent ethmoidal sinusitis, unspecified chronicity 461.2 J01.21   4. Pain, dental 525.9 K08.89    This chart was scribed in my presence and reviewed by me  personally.   By signing my name below, I, Raven Small, attest that this documentation has been prepared under the direction and in the presence of Robyn Haber, MD.  Electronically Signed: Thea Alken, ED Scribe. 02/18/2015. 4:11 PM.  Signed, Robyn Haber, MD 02/18/2015 4:02 PM

## 2015-02-23 ENCOUNTER — Inpatient Hospital Stay: Payer: Self-pay | Admitting: Family Medicine

## 2015-03-05 DIAGNOSIS — B0052 Herpesviral keratitis: Secondary | ICD-10-CM | POA: Diagnosis not present

## 2015-03-07 DIAGNOSIS — B0052 Herpesviral keratitis: Secondary | ICD-10-CM | POA: Diagnosis not present

## 2015-03-18 DIAGNOSIS — D3132 Benign neoplasm of left choroid: Secondary | ICD-10-CM | POA: Diagnosis not present

## 2015-03-18 DIAGNOSIS — E119 Type 2 diabetes mellitus without complications: Secondary | ICD-10-CM | POA: Diagnosis not present

## 2015-03-18 LAB — HM DIABETES EYE EXAM

## 2015-03-23 ENCOUNTER — Ambulatory Visit (INDEPENDENT_AMBULATORY_CARE_PROVIDER_SITE_OTHER): Payer: Medicare Other | Admitting: Family Medicine

## 2015-03-23 VITALS — BP 122/72 | HR 105 | Temp 98.3°F | Resp 17 | Ht 62.0 in | Wt 190.0 lb

## 2015-03-23 DIAGNOSIS — G8929 Other chronic pain: Secondary | ICD-10-CM | POA: Diagnosis not present

## 2015-03-23 DIAGNOSIS — K0889 Other specified disorders of teeth and supporting structures: Secondary | ICD-10-CM | POA: Diagnosis not present

## 2015-03-23 DIAGNOSIS — K219 Gastro-esophageal reflux disease without esophagitis: Secondary | ICD-10-CM

## 2015-03-23 DIAGNOSIS — M545 Low back pain: Secondary | ICD-10-CM | POA: Diagnosis not present

## 2015-03-23 DIAGNOSIS — R51 Headache: Secondary | ICD-10-CM | POA: Diagnosis not present

## 2015-03-23 DIAGNOSIS — M5489 Other dorsalgia: Secondary | ICD-10-CM | POA: Diagnosis not present

## 2015-03-23 MED ORDER — LEVOFLOXACIN 500 MG PO TABS: 500.0000 mg | ORAL_TABLET | Freq: Every day | ORAL | Status: DC

## 2015-03-23 MED ORDER — OMEPRAZOLE 20 MG PO CPDR: 20.0000 mg | DELAYED_RELEASE_CAPSULE | Freq: Every day | ORAL | Status: DC

## 2015-03-23 MED ORDER — ZONISAMIDE 100 MG PO CAPS: 100.0000 mg | ORAL_CAPSULE | Freq: Every day | ORAL | Status: DC

## 2015-03-23 MED ORDER — HYDROCODONE-ACETAMINOPHEN 7.5-325 MG PO TABS: 1.0000 | ORAL_TABLET | Freq: Two times a day (BID) | ORAL | Status: DC | PRN

## 2015-03-23 NOTE — Patient Instructions (Signed)
Please return Dec 26 or 27 to review headache.

## 2015-03-23 NOTE — Progress Notes (Signed)
Subjective:  This chart was scribed for Robyn Haber MD, by Tamsen Roers, at [facility].  This patient was seen in room 9 and the patient's care was started at 11:58 AM.    Patient ID: Ann Lopez, female    DOB: Apr 06, 1971, 44 y.o.   MRN: [id number] Chief Complaint  Patient presents with  . Medication Refill     prilosec, norco   . Cough  . Nasal Congestion  . Depression    HPI  HPI Comments: Ann Lopez is a 44 y.o. female who presents to the [facility] for medication refill (Omeprazole, Hydrocodone).  She is also complaining of an intermittent cough which she describes it to be at the point of not being able to breathe and has to catch her breath onset three weeks ago.  Her cough has led her to feel tightness in her chest afterwards and can hear herself wheezing.  She has associated symptoms of nasal congestion and states her right ear feels stuffy with pressure.  Patient uses Flonase.  She is currently taking Topamax (for her migraines)  but feels like her memory has been worse than before.  Patient is no longer working.   Depression/ anxiety: She states that the Xanax she used to take would help her sleep.  Her panic attacks have come back since she has stopped taking it and is wondering if she can take another Klonopin (which she already takes two times/day)  She states that she is getting very stressed out and feels like she is taking it out on her children. Her panic attacks have increased to the point where she is not able to go into a store. Patient notes that she slept two hours last night. Patient notes that she feels that she has been depressed for "a couple of years".     Migraine: She has headaches mainly when she wakes up in the mornings but thinks that her other headaches are due to not drinking enough water.   Patient has a history of breast cancer.   She is having difficulty remembering to take her medication.     Patient Active Problem List   Diagnosis Date Noted  . Femoral hernia 03/24/2014  . Dyslipidemia 09/10/2011  . BRCA1 positive 09/10/2011  . Breast cancer ([place]) 09/10/2011  . H/O gastric bypass; 05/14/11([facility]) 09/10/2011  . HYPERTENSION 10/28/2008  . DEPRESSION/ANXIETY 02/19/2007  . CARPAL TUNNEL SYNDROME, RIGHT 02/19/2007  . ALLERGIC RHINITIS 02/19/2007  . GERD 02/19/2007  . IRRITABLE BOWEL SYNDROME 02/19/2007  . HERPES GENITALIS 12/30/2006  . DM 12/30/2006  . OSTEOPENIA 12/30/2006  . ANOMALY, CONGENITAL, NERVOUS SYSTEM NEC 12/30/2006   Past Medical History  Diagnosis Date  . Diabetes mellitus   . Back pain   . Osteopenia   . Allergy   . Asthma   . Cancer ([place])   . Breast cancer ([place])   . Depression   . Anxiety   . Osteoporosis     osteopenia  . Headache   . Hyperlipemia    Past Surgical History  Procedure Laterality Date  . Mastectomy Bilateral   . Abdominal hysterectomy    . Lymphadenectomy    . Cesarean section    . Breast surgery    . Incision and drainage abscess Left 11/14/2012    Procedure: INCISION AND DRAINAGE ABSCESS LEFT GROIN;  Surgeon: Jamesetta So, MD;  Location: [facility];  Service: General;  Laterality: Left;   Allergies  Allergen Reactions  . Azithromycin     Unknown  . Morphine And Related     Can tolerate hydrocodone and dilaudid without side effects  . Nitrofurantoin     Unknown  . Penicillins Hives and Itching  . Sulfonamide Derivatives Hives   Prior to Admission medications   Medication Sig Start Date End Date Taking? Authorizing Provider  albuterol (PROVENTIL HFA;VENTOLIN HFA) 108 (90 BASE) MCG/ACT inhaler Inhale 1 puff into the lungs every 6 (six) hours as needed for wheezing or shortness of breath. 09/19/14  Yes Robyn Haber, MD  albuterol (PROVENTIL) (2.5 MG/3ML) 0.083% nebulizer solution Take 3 mLs (2.5 mg total) by nebulization every 6 (six) hours as needed for wheezing or shortness of breath. 6/81/27  Yes Delora Fuel, MD   canagliflozin [facility]) 300 MG TABS tablet Take 300 mg by mouth daily before breakfast. 09/19/14  Yes Robyn Haber, MD  Choline Fenofibrate 135 MG capsule Take 1 capsule (135 mg total) by mouth daily. 05/09/14  Yes Robyn Haber, MD  clonazePAM (KLONOPIN) 1 MG tablet Take 1 tablet (1 mg total) by mouth 2 (two) times daily as needed for anxiety. 09/19/14  Yes Robyn Haber, MD  desvenlafaxine (PRISTIQ) 100 MG 24 hr tablet Take 1 tablet (100 mg total) by mouth daily. 09/19/14  Yes Robyn Haber, MD  HYDROcodone-acetaminophen (NORCO) 7.5-325 MG tablet Take 1 tablet by mouth 2 (two) times daily as needed for moderate pain. 02/18/15  Yes Robyn Haber, MD  levofloxacin (LEVAQUIN) 500 MG tablet Take 1 tablet (500 mg total) by mouth daily. 02/18/15  Yes Robyn Haber, MD  mometasone (NASONEX) 50 MCG/ACT nasal spray Place 2 sprays into the nose daily. 01/20/15  Yes Robyn Haber, MD  naproxen (NAPROSYN) 500 MG tablet Take 1 tablet (500 mg total) by mouth 2 (two) times daily. 02/04/15  Yes Ezequiel Essex, MD  omeprazole (PRILOSEC) 20 MG capsule Take 1 capsule (20 mg total) by mouth daily. 09/19/14  Yes Robyn Haber, MD  topiramate (TOPAMAX) 15 MG capsule Take 15 mg by mouth 2 (two) times daily.   Yes Historical Provider, MD   Social History   Social History  . Marital Status: Single    Spouse Name: N/A  . Number of Children: 4  . Years of Education: 13   Occupational History  . Disabled    Social History Main Topics  . Smoking status: Current Some Day Smoker    Types: Cigarettes  . Smokeless tobacco: Never Used  . Alcohol Use: No  . Drug Use: No  . Sexual Activity: Not Currently   Other Topics Concern  . Not on file   Social History Narrative   Lives at home with her four sons.   Right-handed.   2-3 cups caffeine daily.     Review of Systems  Constitutional: Negative for fever and chills.  HENT: Positive for congestion and ear pain.   Eyes: Negative for pain,  redness and itching.  Respiratory: Positive for cough, chest tightness and wheezing.   Gastrointestinal: Negative for nausea and vomiting.  Musculoskeletal: Negative for neck pain and neck stiffness.  Neurological: Negative for syncope and speech difficulty.       Objective:   Physical Exam  Filed Vitals:   03/23/15 1140  BP: 122/72  Pulse: 105  Temp: 98.3 F (36.8 C)  TempSrc: Oral  Resp: 17  Height: 5' 2"  (1.575 m)  Weight: 190 lb (86.183 kg)  SpO2: 97%    CONSTITUTIONAL: Well developed/well nourished HEAD: Normocephalic/atraumatic EYES: EOMI/PERRL ENMT:  Mucous membranes moist, swollen nasal passages.  NECK: supple no meningeal signs SPINE/BACK:entire spine nontender CV: S1/S2 noted, no murmurs/rubs/gallops noted LUNGS: Lungs are clear to auscultation bilaterally, no apparent distress NEURO: Pt is awake/alert/appropriate, moves all extremitiesx4.  No facial droop.   EXTREMITIES: pulses normal/equal, full ROM SKIN: warm, color normal        Assessment & Plan:   This chart was scribed in my presence and reviewed by me personally.    ICD-9-CM ICD-10-CM   1. Other back pain 724.5 M54.89 HYDROcodone-acetaminophen (NORCO) 7.5-325 MG tablet  2. Chronic low back pain 724.2 M54.5 HYDROcodone-acetaminophen (NORCO) 7.5-325 MG tablet   338.29 G89.29   3. Pain, dental 525.9 K08.89 HYDROcodone-acetaminophen (NORCO) 7.5-325 MG tablet     levofloxacin (LEVAQUIN) 500 MG tablet  4. Gastroesophageal reflux disease without esophagitis 530.81 K21.9 omeprazole (PRILOSEC) 20 MG capsule  5. Chronic nonintractable headache, unspecified headache type 784.0 R51 zonisamide (ZONEGRAN) 100 MG capsule     Signed, Robyn Haber, MD

## 2015-03-29 DIAGNOSIS — C50912 Malignant neoplasm of unspecified site of left female breast: Secondary | ICD-10-CM | POA: Diagnosis not present

## 2015-03-29 DIAGNOSIS — Z1502 Genetic susceptibility to malignant neoplasm of ovary: Secondary | ICD-10-CM | POA: Diagnosis not present

## 2015-03-29 DIAGNOSIS — M8580 Other specified disorders of bone density and structure, unspecified site: Secondary | ICD-10-CM | POA: Diagnosis not present

## 2015-03-29 DIAGNOSIS — Z1501 Genetic susceptibility to malignant neoplasm of breast: Secondary | ICD-10-CM | POA: Diagnosis not present

## 2015-04-25 ENCOUNTER — Ambulatory Visit (INDEPENDENT_AMBULATORY_CARE_PROVIDER_SITE_OTHER): Payer: Medicare Other | Admitting: Family Medicine

## 2015-04-25 VITALS — BP 128/86 | HR 89 | Temp 98.4°F | Resp 20 | Wt 188.0 lb

## 2015-04-25 DIAGNOSIS — E119 Type 2 diabetes mellitus without complications: Secondary | ICD-10-CM

## 2015-04-25 DIAGNOSIS — M5489 Other dorsalgia: Secondary | ICD-10-CM

## 2015-04-25 DIAGNOSIS — K0889 Other specified disorders of teeth and supporting structures: Secondary | ICD-10-CM

## 2015-04-25 DIAGNOSIS — G8929 Other chronic pain: Secondary | ICD-10-CM

## 2015-04-25 DIAGNOSIS — M545 Low back pain: Secondary | ICD-10-CM | POA: Diagnosis not present

## 2015-04-25 LAB — GLUCOSE, POCT (MANUAL RESULT ENTRY): POC Glucose: 217 mg/dl — AB (ref 70–99)

## 2015-04-25 LAB — POCT GLYCOSYLATED HEMOGLOBIN (HGB A1C): Hemoglobin A1C: 8.7

## 2015-04-25 MED ORDER — HYDROCODONE-ACETAMINOPHEN 7.5-325 MG PO TABS: 1.0000 | ORAL_TABLET | Freq: Two times a day (BID) | ORAL | Status: DC | PRN

## 2015-04-25 MED ORDER — LEVOFLOXACIN 500 MG PO TABS: 500.0000 mg | ORAL_TABLET | Freq: Every day | ORAL | Status: DC

## 2015-04-25 NOTE — Progress Notes (Signed)
By signing my name below, I, Moises Blood, attest that this documentation has been prepared under the direction and in the presence of Robyn Haber, MD. Electronically Signed: Moises Blood, [place]. 04/25/2015 , 5:50 PM .  Patient was seen in room 8 .   Patient ID: Ann Lopez MRN: [id number], DOB: 1970-12-11, 45 y.o. Date of Encounter: 04/25/2015  Primary Physician: Robyn Haber, MD  Chief Complaint:  Chief Complaint  Patient presents with  . Dental Pain    right side  . Medication Refill    Norco    HPI:  Ann Lopez is a 45 y.o. female who presents to [facility] complaining of right side dental pain. When she brushed her teeth last night, it hit the area and she states that "white stuff gushed out, and then blood". She felt low grade fever last night without measurement.   Her sugar got too high because she didn't take her medication. She felt some numbness in her right leg with some pain. When she tried to stand, it wasn't able to as her right foot would roll inward. She says that this occurred 2 days ago and it has resolved today.   She mentions that her headaches have improved.   She also needs medication refill on norco.   Past Medical History  Diagnosis Date  . Diabetes mellitus   . Back pain   . Osteopenia   . Allergy   . Asthma   . Cancer ([place])   . Breast cancer ([place])   . Depression   . Anxiety   . Osteoporosis     osteopenia  . Headache   . Hyperlipemia      Home Meds: Prior to Admission medications   Medication Sig Start Date End Date Taking? Authorizing Provider  albuterol (PROVENTIL HFA;VENTOLIN HFA) 108 (90 BASE) MCG/ACT inhaler Inhale 1 puff into the lungs every 6 (six) hours as needed for wheezing or shortness of breath. 09/19/14   Robyn Haber, MD  albuterol (PROVENTIL) (2.5 MG/3ML) 0.083% nebulizer solution Take 3 mLs (2.5 mg total) by nebulization every 6 (six) hours as needed for wheezing or shortness of  breath. Q000111Q   Delora Fuel, MD  canagliflozin [facility]) 300 MG TABS tablet Take 300 mg by mouth daily before breakfast. 09/19/14   Robyn Haber, MD  Choline Fenofibrate 135 MG capsule Take 1 capsule (135 mg total) by mouth daily. 05/09/14   Robyn Haber, MD  clonazePAM (KLONOPIN) 1 MG tablet Take 1 tablet (1 mg total) by mouth 2 (two) times daily as needed for anxiety. 09/19/14   Robyn Haber, MD  desvenlafaxine (PRISTIQ) 100 MG 24 hr tablet Take 1 tablet (100 mg total) by mouth daily. 09/19/14   Robyn Haber, MD  HYDROcodone-acetaminophen (NORCO) 7.5-325 MG tablet Take 1 tablet by mouth 2 (two) times daily as needed for moderate pain. 03/23/15   Robyn Haber, MD  levofloxacin (LEVAQUIN) 500 MG tablet Take 1 tablet (500 mg total) by mouth daily. 03/23/15   Robyn Haber, MD  mometasone (NASONEX) 50 MCG/ACT nasal spray Place 2 sprays into the nose daily. 01/20/15   Robyn Haber, MD  naproxen (NAPROSYN) 500 MG tablet Take 1 tablet (500 mg total) by mouth 2 (two) times daily. 02/04/15   Ezequiel Essex, MD  omeprazole (PRILOSEC) 20 MG capsule Take 1 capsule (20 mg total) by mouth daily. 03/23/15   Robyn Haber, MD  zonisamide (ZONEGRAN) 100 MG capsule Take 1 capsule (100 mg total) by mouth daily. 03/23/15  Robyn Haber, MD    Allergies:  Allergies  Allergen Reactions  . Azithromycin     Unknown  . Morphine And Related     Can tolerate hydrocodone and dilaudid without side effects  . Nitrofurantoin     Unknown  . Penicillins Hives and Itching  . Sulfonamide Derivatives Hives    Social History   Social History  . Marital Status: Single    Spouse Name: N/A  . Number of Children: 4  . Years of Education: 13   Occupational History  . Disabled    Social History Main Topics  . Smoking status: Current Some Day Smoker    Types: Cigarettes  . Smokeless tobacco: Never Used  . Alcohol Use: No  . Drug Use: No  . Sexual Activity: Not Currently   Other Topics  Concern  . Not on file   Social History Narrative   Lives at home with her four sons.   Right-handed.   2-3 cups caffeine daily.     Review of Systems: Constitutional: negative for chills, night sweats, weight changes, or fatigue; positive for fever (low grade) HEENT: negative for vision changes, hearing loss, congestion, rhinorrhea, ST, epistaxis, or sinus pressure; positive for dental pain (right side) Cardiovascular: negative for chest pain or palpitations Respiratory: negative for hemoptysis, wheezing, shortness of breath, or cough Abdominal: negative for abdominal pain, nausea, vomiting, diarrhea, or constipation Dermatological: negative for rash Neurologic: negative for headache, dizziness, or syncope All other systems reviewed and are otherwise negative with the exception to those above and in the HPI.  Physical Exam: Blood pressure 128/86, pulse 89, temperature 98.4 F (36.9 C), temperature source Oral, resp. rate 20, weight 188 lb (85.276 kg), SpO2 98 %., Body mass index is 34.38 kg/(m^2). General: Well developed, well nourished, in no acute distress. Head: Normocephalic, atraumatic, eyes without discharge, sclera non-icteric, nares are without discharge. Bilateral auditory canals clear, TM's are without perforation, pearly grey and translucent with reflective cone of light bilaterally. Right 1st molar, tooth #33 abscess Neck: Supple. No thyromegaly. Full ROM. No lymphadenopathy. Lungs: Clear bilaterally to auscultation without wheezes, rales, or rhonchi. Breathing is unlabored. Heart: RRR with S1 S2. No murmurs, rubs, or gallops appreciated. Msk:  Strength and tone normal for age. Extremities/Skin: Warm and dry. No clubbing or cyanosis. No edema. No rashes or suspicious lesions. Neuro: Alert and oriented X 3. Moves all extremities spontaneously. Gait is normal. CNII-XII grossly in tact. Psych:  Responds to questions appropriately with a normal affect.   Labs: Results for  orders placed or performed in visit on 04/25/15  POCT glycosylated hemoglobin (Hb A1C)  Result Value Ref Range   Hemoglobin A1C 8.7   POCT glucose (manual entry)  Result Value Ref Range   POC Glucose 217 (A) 70 - 99 mg/dl     ASSESSMENT AND PLAN:  45 y.o. year old female with Pain, dental - Plan: levofloxacin (LEVAQUIN) 500 MG tablet, HYDROcodone-acetaminophen (NORCO) 7.5-325 MG tablet, POCT glucose (manual entry)  Other back pain - Plan: HYDROcodone-acetaminophen (NORCO) 7.5-325 MG tablet, POCT glucose (manual entry)  Chronic low back pain - Plan: HYDROcodone-acetaminophen (NORCO) 7.5-325 MG tablet, POCT glucose (manual entry)  Type 2 diabetes mellitus without complication, without long-term current use of insulin (HCC) - Plan: POCT glycosylated hemoglobin (Hb A1C), Microalbumin, urine, COMPLETE METABOLIC PANEL WITH GFR, POCT glucose (manual entry), Ambulatory referral to Endocrinology      Signed, Robyn Haber, MD 04/25/2015 6:09 PM

## 2015-04-25 NOTE — Patient Instructions (Signed)
We need to get your diabetes under better control. Otherwise she will start having more more problems with your strength and nerve function. I'm sending you to a diabetes specialist to review your care and see if we can offer a better combination of medications

## 2015-04-26 LAB — COMPLETE METABOLIC PANEL WITH GFR
ALT: 16 U/L (ref 6–29)
AST: 13 U/L (ref 10–30)
Albumin: 4.3 g/dL (ref 3.6–5.1)
Alkaline Phosphatase: 63 U/L (ref 33–115)
BUN: 12 mg/dL (ref 7–25)
CO2: 25 mmol/L (ref 20–31)
Calcium: 9.7 mg/dL (ref 8.6–10.2)
Chloride: 99 mmol/L (ref 98–110)
Creat: 0.94 mg/dL (ref 0.50–1.10)
GFR, Est African American: 86 mL/min (ref >=60)
GFR, Est Non African American: 75 mL/min (ref >=60)
Glucose, Bld: 230 mg/dL — ABNORMAL HIGH (ref 65–99)
Potassium: 4.2 mmol/L (ref 3.5–5.3)
Sodium: 136 mmol/L (ref 135–146)
Total Bilirubin: 0.5 mg/dL (ref 0.2–1.2)
Total Protein: 7.4 g/dL (ref 6.1–8.1)

## 2015-04-26 LAB — MICROALBUMIN, URINE: Microalb, Ur: 0.3 mg/dL

## 2015-05-04 ENCOUNTER — Encounter: Payer: Self-pay | Admitting: Family Medicine

## 2015-05-14 ENCOUNTER — Other Ambulatory Visit: Payer: Self-pay | Admitting: *Deleted

## 2015-05-14 ENCOUNTER — Other Ambulatory Visit: Payer: Self-pay | Admitting: Family Medicine

## 2015-05-24 ENCOUNTER — Ambulatory Visit (INDEPENDENT_AMBULATORY_CARE_PROVIDER_SITE_OTHER): Payer: Medicare Other | Admitting: Family Medicine

## 2015-05-24 VITALS — BP 102/64 | HR 79 | Temp 98.2°F | Resp 16 | Ht 62.0 in | Wt 192.0 lb

## 2015-05-24 DIAGNOSIS — J209 Acute bronchitis, unspecified: Secondary | ICD-10-CM | POA: Diagnosis not present

## 2015-05-24 DIAGNOSIS — C4491 Basal cell carcinoma of skin, unspecified: Secondary | ICD-10-CM

## 2015-05-24 DIAGNOSIS — M5489 Other dorsalgia: Secondary | ICD-10-CM

## 2015-05-24 DIAGNOSIS — G8929 Other chronic pain: Secondary | ICD-10-CM | POA: Diagnosis not present

## 2015-05-24 DIAGNOSIS — M545 Low back pain: Secondary | ICD-10-CM | POA: Diagnosis not present

## 2015-05-24 DIAGNOSIS — K0889 Other specified disorders of teeth and supporting structures: Secondary | ICD-10-CM

## 2015-05-24 MED ORDER — MOXIFLOXACIN HCL 400 MG PO TABS: 400.0000 mg | ORAL_TABLET | Freq: Every day | ORAL | Status: DC

## 2015-05-24 MED ORDER — HYDROCODONE-ACETAMINOPHEN 7.5-325 MG PO TABS
1.0000 | ORAL_TABLET | Freq: Two times a day (BID) | ORAL | Status: DC | PRN
Start: 2015-05-24 — End: 2015-06-28

## 2015-05-24 NOTE — Progress Notes (Addendum)
By signing my name below, I, Ann Lopez, attest that this documentation has been prepared under the direction and in the presence of Ann Haber, MD. Electronically Signed: Moises Lopez, [place]. 05/24/2015 , 4:15 PM .  Patient was seen in room 10 .   Patient ID: AYSLINN GROSHEK MRN: [id number], DOB: 1970-09-30, 45 y.o. Date of Encounter: 05/24/2015  Primary Physician: Ann Haber, MD  Chief Complaint:  Chief Complaint  Patient presents with  . Cough    x 1 week  . Sore Throat  . Generalized Body Aches    HPI:  Ann Lopez is a 45 y.o. female who presents to [facility] complaining of a bad, dry cough that started a week ago with sore throat and myalgia. She coughed up some yellowish phlegm, but it's been mostly dry. She's tried taking dayquil, nyquil and liquid gel pills without relief.   Her tooth is feeling better. She's yet to see anyone for the dental pain though.   She recently had her breasts examined and she's been cancer free for 8 years now.   Past Medical History  Diagnosis Date  . Diabetes mellitus   . Back pain   . Osteopenia   . Allergy   . Asthma   . Cancer ([place])   . Breast cancer ([place])   . Depression   . Anxiety   . Osteoporosis     osteopenia  . Headache   . Hyperlipemia      Home Meds: Prior to Admission medications   Medication Sig Start Date End Date Taking? Authorizing Provider  albuterol (PROVENTIL HFA;VENTOLIN HFA) 108 (90 BASE) MCG/ACT inhaler Inhale 1 puff into the lungs every 6 (six) hours as needed for wheezing or shortness of breath. 09/19/14   Ann Haber, MD  albuterol (PROVENTIL) (2.5 MG/3ML) 0.083% nebulizer solution Take 3 mLs (2.5 mg total) by nebulization every 6 (six) hours as needed for wheezing or shortness of breath. Q000111Q   Ann Fuel, MD  canagliflozin [facility]) 300 MG TABS tablet Take 300 mg by mouth daily before breakfast. 09/19/14   Ann Haber, MD  Choline Fenofibrate  (FENOFIBRIC ACID) 135 MG CPDR TAKE ONE CAPSULE EVERY DAY 05/18/15   Ann Haber, MD  clonazePAM (KLONOPIN) 1 MG tablet Take 1 tablet (1 mg total) by mouth 2 (two) times daily as needed for anxiety. 09/19/14   Ann Haber, MD  desvenlafaxine (PRISTIQ) 100 MG 24 hr tablet Take 1 tablet (100 mg total) by mouth daily. 09/19/14   Ann Haber, MD  HYDROcodone-acetaminophen (NORCO) 7.5-325 MG tablet Take 1 tablet by mouth 2 (two) times daily as needed for moderate pain. 04/25/15   Ann Haber, MD  levofloxacin (LEVAQUIN) 500 MG tablet Take 1 tablet (500 mg total) by mouth daily. 04/25/15   Ann Haber, MD  mometasone (NASONEX) 50 MCG/ACT nasal spray Place 2 sprays into the nose daily. 01/20/15   Ann Haber, MD  naproxen (NAPROSYN) 500 MG tablet Take 1 tablet (500 mg total) by mouth 2 (two) times daily. 02/04/15   Ann Essex, MD  omeprazole (PRILOSEC) 20 MG capsule Take 1 capsule (20 mg total) by mouth daily. 03/23/15   Ann Haber, MD  zonisamide (ZONEGRAN) 100 MG capsule Take 1 capsule (100 mg total) by mouth daily. 03/23/15   Ann Haber, MD    Allergies:  Allergies  Allergen Reactions  . Azithromycin     Unknown  . Morphine And Related     Can tolerate hydrocodone and dilaudid without side  effects  . Nitrofurantoin     Unknown  . Penicillins Hives and Itching  . Sulfonamide Derivatives Hives    Social History   Social History  . Marital Status: Single    Spouse Name: N/A  . Number of Children: 4  . Years of Education: 13   Occupational History  . Disabled    Social History Main Topics  . Smoking status: Current Some Day Smoker    Types: Cigarettes  . Smokeless tobacco: Never Used  . Alcohol Use: No  . Drug Use: No  . Sexual Activity: Not Currently   Other Topics Concern  . Not on file   Social History Narrative   Lives at home with her four sons.   Right-handed.   2-3 cups caffeine daily.     Review of Systems: Constitutional: negative  for fever, chills, night sweats, weight changes, or fatigue  HEENT: negative for vision changes, hearing loss, congestion, rhinorrhea, epistaxis, or sinus pressure; positive for sore throat Cardiovascular: negative for chest pain or palpitations Respiratory: negative for hemoptysis, wheezing, shortness of breath; positive for cough Abdominal: negative for abdominal pain, nausea, vomiting, diarrhea, or constipation Dermatological: negative for rash Musc: positive for myalgia (general body) Neurologic: negative for headache, dizziness, or syncope All other systems reviewed and are otherwise negative with the exception to those above and in the HPI.  Physical Exam: Lopez pressure 102/64, pulse 79, temperature 98.2 F (36.8 C), resp. rate 16, height 5\' 2"  (1.575 m), weight 192 lb (87.091 kg), SpO2 98 %., Body mass index is 35.11 kg/(m^2). General: Well developed, well nourished, in no acute distress. Head: Normocephalic, atraumatic, eyes without discharge, sclera non-icteric, nares are without discharge. Bilateral auditory canals clear. Oral cavity moist, posterior pharynx without exudate, erythema, peritonsillar abscess, or post nasal drip.  Neck: Supple. No thyromegaly. Full ROM. No lymphadenopathy. Lungs: Clear bilaterally to auscultation without wheezes, rales. Breathing is unlabored; few rhonchi Heart: RRR with S1 S2. No murmurs, rubs, or gallops appreciated. Msk:  Strength and tone normal for age. Extremities/Skin: Warm and dry. 29mm umbilicated papule with ulceration at the top located nasal labia fold Neuro: Alert and oriented X 3. Moves all extremities spontaneously. Gait is normal. CNII-XII grossly in tact. Psych:  Responds to questions appropriately with a normal affect.   ASSESSMENT AND PLAN:  45 y.o. year old female with  This chart was scribed in my presence and reviewed by me personally.    ICD-9-CM ICD-10-CM   1. Acute bronchitis, unspecified organism 466.0 J20.9 moxifloxacin  (AVELOX) 400 MG tablet  2. Other back pain 724.5 M54.89 HYDROcodone-acetaminophen (NORCO) 7.5-325 MG tablet     Ambulatory referral to Pain Clinic  3. Chronic low back pain 724.2 M54.5 HYDROcodone-acetaminophen (NORCO) 7.5-325 MG tablet   338.29 G89.29 Ambulatory referral to Pain Clinic  4. Pain, dental 525.9 K08.89 HYDROcodone-acetaminophen (NORCO) 7.5-325 MG tablet  5. Skin cancer, basal cell 173.91 C44.91 Ambulatory referral to Dermatology  6. Chronic pain 338.29 G89.29 Ambulatory referral to Pain Clinic     Doctor Izora Ribas, dermatology, [phone] is who I prefer.   Signed, Ann Haber, MD 05/24/2015 4:15 PM

## 2015-05-24 NOTE — Patient Instructions (Signed)
I'm referring one skin to [facility] because of the basal cell skin cancer on your right nose. He can return here in a couple weeks and I'll take off the skin lesion on your left shoulder.  Is important that you keep your appointment with [facility].  Since I'm retiring in a couple months, will be important for you to start getting pain medicines at a pain clinic. I'll refer you in the next few days so that you don't have an interruption in your pain control medication

## 2015-06-08 ENCOUNTER — Other Ambulatory Visit: Payer: Self-pay | Admitting: Family Medicine

## 2015-06-08 DIAGNOSIS — G894 Chronic pain syndrome: Secondary | ICD-10-CM

## 2015-06-17 ENCOUNTER — Ambulatory Visit (INDEPENDENT_AMBULATORY_CARE_PROVIDER_SITE_OTHER): Payer: Medicare Other | Admitting: Family Medicine

## 2015-06-17 ENCOUNTER — Ambulatory Visit (INDEPENDENT_AMBULATORY_CARE_PROVIDER_SITE_OTHER): Payer: Medicare Other

## 2015-06-17 ENCOUNTER — Encounter (HOSPITAL_COMMUNITY): Payer: Self-pay

## 2015-06-17 ENCOUNTER — Emergency Department (HOSPITAL_COMMUNITY)
Admission: EM | Admit: 2015-06-17 | Discharge: 2015-06-18 | Disposition: A | Discharge status: Home / Self Care | Payer: Medicare Other | Attending: Emergency Medicine | Admitting: Emergency Medicine

## 2015-06-17 VITALS — BP 99/66 | HR 103 | Temp 99.5°F | Resp 18 | Ht 62.0 in | Wt 191.2 lb

## 2015-06-17 DIAGNOSIS — Z3202 Encounter for pregnancy test, result negative: Secondary | ICD-10-CM | POA: Diagnosis not present

## 2015-06-17 DIAGNOSIS — D72829 Elevated white blood cell count, unspecified: Secondary | ICD-10-CM

## 2015-06-17 DIAGNOSIS — Z8701 Personal history of pneumonia (recurrent): Secondary | ICD-10-CM

## 2015-06-17 DIAGNOSIS — Z853 Personal history of malignant neoplasm of breast: Secondary | ICD-10-CM | POA: Insufficient documentation

## 2015-06-17 DIAGNOSIS — R05 Cough: Secondary | ICD-10-CM | POA: Diagnosis not present

## 2015-06-17 DIAGNOSIS — J101 Influenza due to other identified influenza virus with other respiratory manifestations: Secondary | ICD-10-CM | POA: Insufficient documentation

## 2015-06-17 DIAGNOSIS — R404 Transient alteration of awareness: Secondary | ICD-10-CM | POA: Diagnosis not present

## 2015-06-17 DIAGNOSIS — R509 Fever, unspecified: Secondary | ICD-10-CM | POA: Diagnosis present

## 2015-06-17 DIAGNOSIS — F329 Major depressive disorder, single episode, unspecified: Secondary | ICD-10-CM | POA: Insufficient documentation

## 2015-06-17 DIAGNOSIS — E118 Type 2 diabetes mellitus with unspecified complications: Secondary | ICD-10-CM | POA: Diagnosis not present

## 2015-06-17 DIAGNOSIS — Z880 Allergy status to penicillin: Secondary | ICD-10-CM | POA: Insufficient documentation

## 2015-06-17 DIAGNOSIS — J45909 Unspecified asthma, uncomplicated: Secondary | ICD-10-CM | POA: Insufficient documentation

## 2015-06-17 DIAGNOSIS — E1165 Type 2 diabetes mellitus with hyperglycemia: Secondary | ICD-10-CM | POA: Diagnosis not present

## 2015-06-17 DIAGNOSIS — Z79899 Other long term (current) drug therapy: Secondary | ICD-10-CM | POA: Insufficient documentation

## 2015-06-17 DIAGNOSIS — Z8739 Personal history of other diseases of the musculoskeletal system and connective tissue: Secondary | ICD-10-CM | POA: Diagnosis not present

## 2015-06-17 DIAGNOSIS — R531 Weakness: Secondary | ICD-10-CM | POA: Diagnosis not present

## 2015-06-17 DIAGNOSIS — J111 Influenza due to unidentified influenza virus with other respiratory manifestations: Secondary | ICD-10-CM | POA: Diagnosis not present

## 2015-06-17 DIAGNOSIS — IMO0002 Reserved for concepts with insufficient information to code with codable children: Secondary | ICD-10-CM

## 2015-06-17 DIAGNOSIS — Z7951 Long term (current) use of inhaled steroids: Secondary | ICD-10-CM | POA: Insufficient documentation

## 2015-06-17 DIAGNOSIS — R69 Illness, unspecified: Principal | ICD-10-CM

## 2015-06-17 DIAGNOSIS — E119 Type 2 diabetes mellitus without complications: Secondary | ICD-10-CM | POA: Insufficient documentation

## 2015-06-17 DIAGNOSIS — F419 Anxiety disorder, unspecified: Secondary | ICD-10-CM | POA: Insufficient documentation

## 2015-06-17 DIAGNOSIS — F17210 Nicotine dependence, cigarettes, uncomplicated: Secondary | ICD-10-CM | POA: Insufficient documentation

## 2015-06-17 DIAGNOSIS — Z7984 Long term (current) use of oral hypoglycemic drugs: Secondary | ICD-10-CM | POA: Insufficient documentation

## 2015-06-17 LAB — BASIC METABOLIC PANEL
Anion gap: 8 (ref 5–15)
BUN: 8 mg/dL (ref 6–20)
CALCIUM: 8.5 mg/dL — AB (ref 8.9–10.3)
CO2: 22 mmol/L (ref 22–32)
CREATININE: 0.55 mg/dL (ref 0.44–1.00)
Chloride: 103 mmol/L (ref 101–111)
GFR calc Af Amer: >60 mL/min (ref >60)
GFR calc non Af Amer: >60 mL/min (ref >60)
GLUCOSE: 264 mg/dL — AB (ref 65–99)
Potassium: 3.6 mmol/L (ref 3.5–5.1)
Sodium: 133 mmol/L — ABNORMAL LOW (ref 135–145)

## 2015-06-17 LAB — POCT CBC
Granulocyte percent: 81.6 %G — AB (ref 37–80)
HEMATOCRIT: 44.8 % (ref 37.7–47.9)
Hemoglobin: 15.7 g/dL (ref 12.2–16.2)
LYMPH, POC: 2.3 (ref 0.6–3.4)
MCH, POC: 29.4 pg (ref 27–31.2)
MCHC: 35.1 g/dL (ref 31.8–35.4)
MCV: 83.8 fL (ref 80–97)
MID (CBC): 0.3 (ref 0–0.9)
MPV: 7.7 fL (ref 0–99.8)
POC GRANULOCYTE: 11.5 — AB (ref 2–6.9)
POC LYMPH %: 16.1 % (ref 10–50)
POC MID %: 2.3 %M (ref 0–12)
Platelet Count, POC: 193 10*3/uL (ref 142–424)
RBC: 5.34 M/uL (ref 4.04–5.48)
RDW, POC: 13.8 %
WBC: 14.1 10*3/uL — AB (ref 4.6–10.2)

## 2015-06-17 LAB — URINE MICROSCOPIC-ADD ON

## 2015-06-17 LAB — CBC WITH DIFFERENTIAL/PLATELET
BASOS ABS: 0.1 10*3/uL (ref 0.0–0.1)
Basophils Relative: 1 %
EOS ABS: 0 10*3/uL (ref 0.0–0.7)
Eosinophils Relative: 0 %
HCT: 41.3 % (ref 36.0–46.0)
HEMOGLOBIN: 13.5 g/dL (ref 12.0–15.0)
LYMPHS ABS: 2.9 10*3/uL (ref 0.7–4.0)
LYMPHS PCT: 26 %
MCH: 28.4 pg (ref 26.0–34.0)
MCHC: 32.7 g/dL (ref 30.0–36.0)
MCV: 86.9 fL (ref 78.0–100.0)
Monocytes Absolute: 0.9 10*3/uL (ref 0.1–1.0)
Monocytes Relative: 8 %
NEUTROS PCT: 65 %
Neutro Abs: 7.1 10*3/uL (ref 1.7–7.7)
Platelets: 197 10*3/uL (ref 150–400)
RBC: 4.75 MIL/uL (ref 3.87–5.11)
RDW: 14.2 % (ref 11.5–15.5)
WBC: 10.9 10*3/uL — AB (ref 4.0–10.5)

## 2015-06-17 LAB — POCT URINALYSIS DIP (MANUAL ENTRY)
BILIRUBIN UA: NEGATIVE
Blood, UA: NEGATIVE
Glucose, UA: 500 — AB
Ketones, POC UA: NEGATIVE
LEUKOCYTES UA: NEGATIVE
NITRITE UA: NEGATIVE
PH UA: 5
PROTEIN UA: NEGATIVE
Spec Grav, UA: 1.01
Urobilinogen, UA: 0.2

## 2015-06-17 LAB — URINALYSIS, ROUTINE W REFLEX MICROSCOPIC
BILIRUBIN URINE: NEGATIVE
Glucose, UA: >1000 mg/dL — AB
Hgb urine dipstick: NEGATIVE
Ketones, ur: NEGATIVE mg/dL
Leukocytes, UA: NEGATIVE
NITRITE: NEGATIVE
PROTEIN: NEGATIVE mg/dL
SPECIFIC GRAVITY, URINE: 1.028 (ref 1.005–1.030)
pH: 5.5 (ref 5.0–8.0)

## 2015-06-17 LAB — POCT URINE PREGNANCY: Preg Test, Ur: NEGATIVE

## 2015-06-17 LAB — POCT GLYCOSYLATED HEMOGLOBIN (HGB A1C): Hemoglobin A1C: 11.1

## 2015-06-17 LAB — POCT INFLUENZA A/B
INFLUENZA A, POC: NEGATIVE
INFLUENZA B, POC: POSITIVE — AB

## 2015-06-17 LAB — GLUCOSE, POCT (MANUAL RESULT ENTRY): POC GLUCOSE: 326 mg/dL — AB (ref 70–99)

## 2015-06-17 LAB — I-STAT CG4 LACTIC ACID, ED: LACTIC ACID, VENOUS: 1.04 mmol/L (ref 0.5–2.0)

## 2015-06-17 MED ORDER — IBUPROFEN 800 MG PO TABS: 800.0000 mg | ORAL_TABLET | Freq: Three times a day (TID) | ORAL | Status: DC

## 2015-06-17 MED ORDER — OSELTAMIVIR PHOSPHATE 75 MG PO CAPS
75.0000 mg | ORAL_CAPSULE | Freq: Once | ORAL | Status: AC
  Administered 2015-06-17: 75 mg via ORAL
  Filled 2015-06-17: qty 1

## 2015-06-17 MED ORDER — SODIUM CHLORIDE 0.9 % IV BOLUS (SEPSIS)
1000.0000 mL | Freq: Once | INTRAVENOUS | Status: AC
  Administered 2015-06-17: 1000 mL via INTRAVENOUS

## 2015-06-17 MED ORDER — DM-GUAIFENESIN ER 30-600 MG PO TB12: 1.0000 | ORAL_TABLET | Freq: Two times a day (BID) | ORAL | Status: DC

## 2015-06-17 MED ORDER — KETOROLAC TROMETHAMINE 30 MG/ML IJ SOLN
30.0000 mg | Freq: Once | INTRAMUSCULAR | Status: AC
Start: 2015-06-17 — End: 2015-06-17
  Administered 2015-06-17: 30 mg via INTRAVENOUS
  Filled 2015-06-17: qty 1

## 2015-06-17 MED ORDER — CEFTRIAXONE SODIUM 1 G IJ SOLR
1.0000 g | Freq: Once | INTRAMUSCULAR | Status: AC
  Administered 2015-06-17: 1 g via INTRAMUSCULAR

## 2015-06-17 MED ORDER — OSELTAMIVIR PHOSPHATE 75 MG PO CAPS: 75.0000 mg | ORAL_CAPSULE | Freq: Two times a day (BID) | ORAL | Status: DC

## 2015-06-17 NOTE — ED Notes (Signed)
Per EMS pt presents from PCP office where she tested + for flu.  Per EMS pt given 1500 ml's of NS as well as 1 g of tylenol at PCP office.  Per EMS PCP wanted pt transported for further evaluation.

## 2015-06-17 NOTE — Discharge Instructions (Signed)
You were seen in the ER today for evaluation of the flu. Your labs today were unremarkable. We gave you IV fluids and the first dose of Tamiflu. I will give you several prescriptions to help with your symptoms. Take as prescribed. Follow up with your primary care provider next week. Return to the ER for new or worsening symptoms.   Influenza, Adult Influenza ("the flu") is a viral infection of the respiratory tract. It occurs more often in winter months because people spend more time in close contact with one another. Influenza can make you feel very sick. Influenza easily spreads from person to person (contagious). CAUSES  Influenza is caused by a virus that infects the respiratory tract. You can catch the virus by breathing in droplets from an infected person's cough or sneeze. You can also catch the virus by touching something that was recently contaminated with the virus and then touching your mouth, nose, or eyes. RISKS AND COMPLICATIONS You may be at risk for a more severe case of influenza if you smoke cigarettes, have diabetes, have chronic heart disease (such as heart failure) or lung disease (such as asthma), or if you have a weakened immune system. Elderly people and pregnant women are also at risk for more serious infections. The most common problem of influenza is a lung infection (pneumonia). Sometimes, this problem can require emergency medical care and may be life threatening. SIGNS AND SYMPTOMS  Symptoms typically last 4 to 10 days and may include:  Fever.  Chills.  Headache, body aches, and muscle aches.  Sore throat.  Chest discomfort and cough.  Poor appetite.  Weakness or feeling tired.  Dizziness.  Nausea or vomiting. DIAGNOSIS  Diagnosis of influenza is often made based on your history and a physical exam. A nose or throat swab test can be done to confirm the diagnosis. TREATMENT  In mild cases, influenza goes away on its own. Treatment is directed at relieving  symptoms. For more severe cases, your health care provider may prescribe antiviral medicines to shorten the sickness. Antibiotic medicines are not effective because the infection is caused by a virus, not by bacteria. HOME CARE INSTRUCTIONS  Take medicines only as directed by your health care provider.  Use a cool mist humidifier to make breathing easier.  Get plenty of rest until your temperature returns to normal. This usually takes 3 to 4 days.  Drink enough fluid to keep your urine clear or pale yellow.  Cover yourmouth and nosewhen coughing or sneezing,and wash your handswellto prevent thevirusfrom spreading.  Stay homefromwork orschool untilthe fever is gonefor at least 67full day. PREVENTION  An annual influenza vaccination (flu shot) is the best way to avoid getting influenza. An annual flu shot is now routinely recommended for all adults in the [place] IF:  You experiencechest pain, yourcough worsens,or you producemore mucus.  Youhave nausea,vomiting, ordiarrhea.  Your fever returns or gets worse. SEEK IMMEDIATE MEDICAL CARE IF:  You havetrouble breathing, you become short of breath,or your skin ornails becomebluish.  You have severe painor stiffnessin the neck.  You develop a sudden headache, or pain in the face or ear.  You have nausea or vomiting that you cannot control. MAKE SURE YOU:   Understand these instructions.  Will watch your condition.  Will get help right away if you are not doing well or get worse.   This information is not intended to replace advice given to you by your health care provider. Make sure you discuss  any questions you have with your health care provider.   Document Released: 03/23/2000 Document Revised: 04/16/2014 Document Reviewed: 06/25/2011 Elsevier Interactive Patient Education [employer].

## 2015-06-17 NOTE — ED Notes (Signed)
Entry at 2328: wrong entry.

## 2015-06-17 NOTE — ED Provider Notes (Signed)
CSN: [id number]     Arrival date & time 06/17/15  2027 History   First MD Initiated Contact with Patient 06/17/15 2122     Chief Complaint  Patient presents with  . Flu like sx     HPI  [name] is an 45 y.o. female with history of DM, asthma, HLD, breast cancer who presents tot he ED for evaluation of flu. She was seen in PCP office today, tested positive for influenza B. Her symptoms began two days ago and included dry cough, body aches, fever, chills. She is sent to the ED for possible obs admission given pt's clinical presentation and concern for complications as she is a diabetic. EMR review reveals pt was febrile to 102.4, tachycardic to 122 on initial eval at PCP. Temp and HR improved with tylenol and 1.5L NS bolus, though pressures remained soft in the 90s/60s. Pt apparently also was afraid to go home because of how bad she felt. CXR at PCP was negative for pneumonia. Leukocytosis of 14. She was given 1g rocephin in office.  In the ED now pt reports she feels extremely tired and body aches all over. She states her throat hurts from coughing so much. States she is only mildly nauseated. Denies any abdominal pain, emesis, or diarrhea. She states her sugars have been high at home. On oral DM regimen only.   Past Medical History  Diagnosis Date  . Diabetes mellitus   . Back pain   . Osteopenia   . Allergy   . Asthma   . Cancer ([place])   . Breast cancer ([place])   . Depression   . Anxiety   . Osteoporosis     osteopenia  . Headache   . Hyperlipemia    Past Surgical History  Procedure Laterality Date  . Mastectomy Bilateral   . Abdominal hysterectomy    . Lymphadenectomy    . Cesarean section    . Breast surgery    . Incision and drainage abscess Left 11/14/2012    Procedure: INCISION AND DRAINAGE ABSCESS LEFT GROIN;  Surgeon: Jamesetta So, MD;  Location: [facility];  Service: General;  Laterality: Left;   Family History  Problem Relation Age of Onset  . Cancer Mother 14    breast  cancer  . Hypertension Sister   . Hypothyroidism Brother   . Cancer Maternal Grandmother 16    breast cancer  . Cancer Maternal Grandfather 44    pancreatic cancer   Social History  Substance Use Topics  . Smoking status: Current Some Day Smoker    Types: Cigarettes  . Smokeless tobacco: Never Used  . Alcohol Use: No   OB History    No data available     Review of Systems  All other systems reviewed and are negative.     Allergies  Azithromycin; Morphine and related; Nitrofurantoin; Penicillins; and Sulfonamide derivatives  Home Medications   Prior to Admission medications   Medication Sig Start Date End Date Taking? Authorizing Provider  albuterol (PROVENTIL HFA;VENTOLIN HFA) 108 (90 BASE) MCG/ACT inhaler Inhale 1 puff into the lungs every 6 (six) hours as needed for wheezing or shortness of breath. 09/19/14  Yes Robyn Haber, MD  albuterol (PROVENTIL) (2.5 MG/3ML) 0.083% nebulizer solution Take 3 mLs (2.5 mg total) by nebulization every 6 (six) hours as needed for wheezing or shortness of breath. Q000111Q  Yes Delora Fuel, MD  canagliflozin [facility]) 300 MG TABS tablet Take 300 mg by mouth daily before breakfast. 09/19/14  Yes Robyn Haber, MD  Choline Fenofibrate (FENOFIBRIC ACID) 135 MG CPDR TAKE ONE CAPSULE EVERY DAY 05/18/15  Yes Robyn Haber, MD  clonazePAM (KLONOPIN) 1 MG tablet Take 1 tablet (1 mg total) by mouth 2 (two) times daily as needed for anxiety. 09/19/14  Yes Robyn Haber, MD  desvenlafaxine (PRISTIQ) 100 MG 24 hr tablet Take 1 tablet (100 mg total) by mouth daily. 09/19/14  Yes Robyn Haber, MD  HYDROcodone-acetaminophen (NORCO) 7.5-325 MG tablet Take 1 tablet by mouth 2 (two) times daily as needed for moderate pain. 05/24/15  Yes Robyn Haber, MD  ibuprofen (ADVIL,MOTRIN) 200 MG tablet Take 800 mg by mouth every 6 (six) hours as needed for fever.   Yes Historical Provider, MD  mometasone (NASONEX) 50 MCG/ACT nasal spray Place 2 sprays into the  nose daily. 01/20/15  Yes Robyn Haber, MD  omeprazole (PRILOSEC) 20 MG capsule Take 1 capsule (20 mg total) by mouth daily. 03/23/15  Yes Robyn Haber, MD  zonisamide (ZONEGRAN) 100 MG capsule Take 1 capsule (100 mg total) by mouth daily. 03/23/15  Yes Robyn Haber, MD   BP 98/70 mmHg  Pulse 93  Temp(Src) 99.1 F (37.3 C) (Oral)  Resp 16  Ht 5\' 2"  (1.575 m)  Wt 83.915 kg  BMI 33.83 kg/m2  SpO2 96% Physical Exam  Constitutional: She is oriented to person, place, and time. She appears ill.  HENT:  Right Ear: Tympanic membrane and external ear normal.  Left Ear: Tympanic membrane and external ear normal.  Nose: Nose normal.  Mouth/Throat: Mucous membranes are dry. No trismus in the jaw. Posterior oropharyngeal erythema present. No oropharyngeal exudate or posterior oropharyngeal edema.  Eyes: Conjunctivae and EOM are normal. Pupils are equal, round, and reactive to light.  Neck: Normal range of motion. Neck supple.  Cardiovascular: Normal rate, regular rhythm, normal heart sounds and intact distal pulses.   Pulmonary/Chest: Effort normal and breath sounds normal. No respiratory distress. She has no wheezes. She has no rales.  Abdominal: Soft. Bowel sounds are normal. She exhibits no distension. There is no tenderness. There is no rebound and no guarding.  Musculoskeletal: She exhibits no edema.  Neurological: She is alert and oriented to person, place, and time. No cranial nerve deficit.  Skin: Skin is warm and dry. There is pallor.  Psychiatric: She has a normal mood and affect.  Nursing note and vitals reviewed.  Filed Vitals:   06/17/15 2039 06/17/15 2326  BP: 98/70 108/70  Pulse: 93 90  Temp: 99.1 F (37.3 C)   TempSrc: Oral   Resp: 16 18  Height: 5\' 2"  (1.575 m)   Weight: 83.915 kg   SpO2: 96% 98%     ED Course  Procedures (including critical care time) Labs Review Labs Reviewed  BASIC METABOLIC PANEL - Abnormal; Notable for the following:    Sodium 133  (*)    Glucose, Bld 264 (*)    Calcium 8.5 (*)    All other components within normal limits  URINALYSIS, ROUTINE W REFLEX MICROSCOPIC (NOT AT [facility]) - Abnormal; Notable for the following:    Glucose, UA >1000 (*)    All other components within normal limits  CBC WITH DIFFERENTIAL/PLATELET - Abnormal; Notable for the following:    WBC 10.9 (*)    All other components within normal limits  URINE MICROSCOPIC-ADD ON - Abnormal; Notable for the following:    Squamous Epithelial / LPF 0-5 (*)    Bacteria, UA RARE (*)    All other components  within normal limits  CULTURE, BLOOD (ROUTINE X 2)  CULTURE, BLOOD (ROUTINE X 2)  URINE CULTURE  I-STAT CG4 LACTIC ACID, ED  I-STAT CG4 LACTIC ACID, ED    Imaging Review Dg Chest 2 View  06/17/2015  CLINICAL DATA:  Cough and short of breath.  History of pneumonia EXAM: CHEST  2 VIEW COMPARISON:  02/04/2015 FINDINGS: The heart size and mediastinal contours are within normal limits. Both lungs are clear. The visualized skeletal structures are unremarkable. IMPRESSION: No active cardiopulmonary disease. Electronically Signed   By: Franchot Gallo M.D.   On: 06/17/2015 19:00   I have personally reviewed and evaluated these images and lab results as part of my medical decision-making.   EKG Interpretation None      MDM   Final diagnoses:  Influenza B    Pt is an 45 y.o. female with history of DM, asthma who presents to teh ED from PCP for evaluation of influenza. PCP was concerned pt would need admission for obs for complicated influenza. Labs here unremarkable. Mild leukocytosis of 10.9. No lactic acid elevation. She has remained afebrile with no tachycardia. BP on the soft side but this is typical for pt. She has no hypoxia. No increased WOB or tachypnea. She is tolerating PO. I have no indication for admission at this time. I discussed this with pt. I reviewed ER return precautions. Rx given for tamiflu and supportive meds. She verbalized her agreement  and understanding with plan.     Anne Ng, PA-C 06/18/15 0010  Charlesetta Shanks, MD 06/22/15 1016

## 2015-06-17 NOTE — Progress Notes (Signed)
06/17/2015 7:38 PM   DOB: [birth year] / MRN: [id number]  SUBJECTIVE:  Ann Lopez is a 45 y.o. female with a history of uncontrolled diabetes presenting for fever, nausea, myalgia and cough that started this morning.  She has a greater than 10 pack year history of smoking.  She has not had the flu shot.  She tried taking 4 ibuprofen at 4 pm to reduce her fever and this did not work. She feels this is the sickest she has ever been.     She is allergic to azithromycin; morphine and related; nitrofurantoin; penicillins; and sulfonamide derivatives.   She  has a past medical history of Diabetes mellitus; Back pain; Osteopenia; Allergy; Asthma; Cancer ([place]); Breast cancer ([place]); Depression; Anxiety; Osteoporosis; Headache; and Hyperlipemia.    She  reports that she has been smoking Cigarettes.  She has never used smokeless tobacco. She reports that she does not drink alcohol or use illicit drugs. She  reports that she does not currently engage in sexual activity. The patient  has past surgical history that includes Mastectomy (Bilateral); Abdominal hysterectomy; Lymphadenectomy; Cesarean section; Breast surgery; and Incision and drainage abscess (Left, 11/14/2012).  Her family history includes Cancer (age of onset: 13) in her mother; Cancer (age of onset: 32) in her maternal grandfather and maternal grandmother; Hypertension in her sister; Hypothyroidism in her brother.  Review of Systems  Constitutional: Positive for malaise/fatigue. Negative for fever, chills and diaphoresis.  HENT: Positive for congestion and sore throat.   Respiratory: Positive for cough. Negative for hemoptysis, shortness of breath and wheezing.   Cardiovascular: Negative for chest pain.  Gastrointestinal: Negative for nausea.  Skin: Negative for rash.  Neurological: Positive for dizziness. Negative for weakness.  Endo/Heme/Allergies: Negative for polydipsia.    Problem list and medications reviewed and updated by myself  where necessary, and exist elsewhere in the encounter.   OBJECTIVE:  BP 99/66 mmHg  Pulse 103  Temp(Src) 99.5 F (37.5 C) (Oral)  Resp 18  Ht 5\' 2"  (1.575 m)  Wt 191 lb 3.2 oz (86.728 kg)  BMI 34.96 kg/m2  SpO2 95%  Physical Exam  Constitutional: She is oriented to person, place, and time. She appears well-nourished. She appears ill.  Eyes: EOM are normal. Pupils are equal, round, and reactive to light.  Cardiovascular: Regular rhythm.  Tachycardia present.   Pulmonary/Chest: Effort normal.  Abdominal: She exhibits no distension.  Neurological: She is alert and oriented to person, place, and time. No cranial nerve deficit or sensory deficit. Gait normal. GCS eye subscore is 4. GCS verbal subscore is 5. GCS motor subscore is 6.  Skin: Skin is dry. No rash noted. She is not diaphoretic. No erythema. There is pallor.  Psychiatric: She has a normal mood and affect.  Vitals reviewed.   Results for orders placed or performed in visit on 06/17/15 (from the past 72 hour(s))  POCT CBC     Status: Abnormal   Collection Time: 06/17/15  6:42 PM  Result Value Ref Range   WBC 14.1 (A) 4.6 - 10.2 K/uL   Lymph, poc 2.3 0.6 - 3.4   POC LYMPH PERCENT 16.1 10 - 50 %L   MID (cbc) 0.3 0 - 0.9   POC MID % 2.3 0 - 12 %M   POC Granulocyte 11.5 (A) 2 - 6.9   Granulocyte percent 81.6 (A) 37 - 80 %G   RBC 5.34 4.04 - 5.48 M/uL   Hemoglobin 15.7 12.2 - 16.2 g/dL   HCT, POC  44.8 37.7 - 47.9 %   MCV 83.8 80 - 97 fL   MCH, POC 29.4 27 - 31.2 pg   MCHC 35.1 31.8 - 35.4 g/dL   RDW, POC 13.8 %   Platelet Count, POC 193 142 - 424 K/uL   MPV 7.7 0 - 99.8 fL  POCT glucose (manual entry)     Status: Abnormal   Collection Time: 06/17/15  6:42 PM  Result Value Ref Range   POC Glucose 326 (A) 70 - 99 mg/dl  POCT urinalysis dipstick     Status: Abnormal   Collection Time: 06/17/15  6:42 PM  Result Value Ref Range   Color, UA yellow yellow   Clarity, UA clear clear   Glucose, UA =500 (A) negative    Bilirubin, UA negative negative   Ketones, POC UA negative negative   Spec Grav, UA 1.010    Blood, UA negative negative   pH, UA 5.0    Protein Ur, POC negative negative   Urobilinogen, UA 0.2    Nitrite, UA Negative Negative   Leukocytes, UA Negative Negative  POCT urine pregnancy     Status: None   Collection Time: 06/17/15  6:42 PM  Result Value Ref Range   Preg Test, Ur Negative Negative  POCT Influenza A/B     Status: Abnormal   Collection Time: 06/17/15  6:42 PM  Result Value Ref Range   Influenza A, POC Negative Negative   Influenza B, POC Positive (A) Negative   Lab Results  Component Value Date   CREATININE 0.94 04/25/2015   Orthostatic VS for the past 24 hrs:  BP- Lying Pulse- Lying BP- Sitting Pulse- Sitting BP- Standing at 0 minutes Pulse- Standing at 0 minutes  06/17/15 1936 99/67 mmHg 90 103/71 mmHg 103 95/66 mmHg 103     Dg Chest 2 View  06/17/2015  CLINICAL DATA:  Cough and short of breath.  History of pneumonia EXAM: CHEST  2 VIEW COMPARISON:  02/04/2015 FINDINGS: The heart size and mediastinal contours are within normal limits. Both lungs are clear. The visualized skeletal structures are unremarkable. IMPRESSION: No active cardiopulmonary disease. Electronically Signed   By: Franchot Gallo M.D.   On: 06/17/2015 19:00    ASSESSMENT AND PLAN  [name] was seen today for cough, shortness of breath and fever.  Diagnoses and all orders for this visit:  Influenza-like illness: She is positive for influenza B and may be mounting a superimposed bacterial infection given her CBC.  She is an uncontrolled diabetic. We have given her 1000 mg of tylenol and 2 liters here with little symptomatic improvement.  She is mildly tachycardic however the orthostatics are holding up. Her temp has come down from 102 to 99. We have given her 1 gram of ceftriaxone here. I am very concerned about sending her home and treating her outpatient. She has also expressed concern about going  home and not getting the care she needs..  Will send her to the ED for further for care.     -     POCT CBC -     POCT glucose (manual entry) -     POCT urinalysis dipstick -     POCT urine pregnancy -     POCT Influenza A/B -     DG Chest 2 View; Future -     POCT glycosylated hemoglobin (Hb A1C) -     COMPLETE METABOLIC PANEL WITH GFR  History of pneumonia: See problem 1.  Uncontrolled type  2 diabetes mellitus with complication, without long-term current use of insulin ([place]): See problem 1.      The patient was advised to call or return to clinic if she does not see an improvement in symptoms or to seek the care of the closest emergency department if she worsens with the above plan.   Philis Fendt, MHS, PA-C [facility] 06/17/2015 7:38 PM

## 2015-06-17 NOTE — Patient Instructions (Addendum)
Because you received an x-ray today, you will receive an invoice from [facility]. Please contact [facility] at [phone] with questions or concerns regarding your invoice. Our billing staff will not be able to assist you with those questions. IF you received an x-ray today, you will receive an invoice from [facility]. Please contact [facility] at [phone] with questions or concerns regarding your invoice.   IF you received labwork today, you will receive an invoice from [employer]. Please contact [facility] at [phone] with questions or concerns regarding your invoice.   Our billing staff will not be able to assist you with questions regarding bills from these companies.  You will be contacted with the lab results as soon as they are available. The fastest way to get your results is to activate your My Chart account. Instructions are located on the last page of this paperwork. If you have not heard from [place] regarding the results in 2 weeks, please contact this office.

## 2015-06-18 LAB — COMPLETE METABOLIC PANEL WITH GFR
ALBUMIN: 4.2 g/dL (ref 3.6–5.1)
ALK PHOS: 76 U/L (ref 33–115)
ALT: 18 U/L (ref 6–29)
AST: 20 U/L (ref 10–30)
BILIRUBIN TOTAL: 0.8 mg/dL (ref 0.2–1.2)
BUN: 9 mg/dL (ref 7–25)
CALCIUM: 9.4 mg/dL (ref 8.6–10.2)
CHLORIDE: 97 mmol/L — AB (ref 98–110)
CO2: 25 mmol/L (ref 20–31)
CREATININE: 0.7 mg/dL (ref 0.50–1.10)
GFR, Est Non African American: >89 mL/min (ref >=60)
Glucose, Bld: 328 mg/dL — ABNORMAL HIGH (ref 65–99)
Potassium: 4.2 mmol/L (ref 3.5–5.3)
Sodium: 132 mmol/L — ABNORMAL LOW (ref 135–146)
TOTAL PROTEIN: 7.1 g/dL (ref 6.1–8.1)

## 2015-06-19 LAB — URINE CULTURE

## 2015-06-22 LAB — CULTURE, BLOOD (ROUTINE X 2)
Culture: NO GROWTH
Culture: NO GROWTH

## 2015-06-22 NOTE — Progress Notes (Signed)
Patient ID: Ann Lopez, female   DOB: December 27, 1970, 45 y.o.   MRN: [id number] Reviewed documentation and agree w/ assessment and plan. Delman Cheadle, MD MPH

## 2015-06-28 ENCOUNTER — Ambulatory Visit (INDEPENDENT_AMBULATORY_CARE_PROVIDER_SITE_OTHER): Payer: Medicare Other | Admitting: Family Medicine

## 2015-06-28 ENCOUNTER — Telehealth: Payer: Self-pay

## 2015-06-28 VITALS — BP 104/72 | HR 76 | Temp 98.2°F | Resp 17 | Ht 62.0 in | Wt 187.0 lb

## 2015-06-28 DIAGNOSIS — K0889 Other specified disorders of teeth and supporting structures: Secondary | ICD-10-CM

## 2015-06-28 DIAGNOSIS — M5489 Other dorsalgia: Secondary | ICD-10-CM

## 2015-06-28 DIAGNOSIS — G8929 Other chronic pain: Secondary | ICD-10-CM | POA: Diagnosis not present

## 2015-06-28 DIAGNOSIS — F410 Panic disorder [episodic paroxysmal anxiety] without agoraphobia: Secondary | ICD-10-CM | POA: Diagnosis not present

## 2015-06-28 DIAGNOSIS — M545 Low back pain, unspecified: Secondary | ICD-10-CM

## 2015-06-28 DIAGNOSIS — E119 Type 2 diabetes mellitus without complications: Secondary | ICD-10-CM

## 2015-06-28 MED ORDER — HYDROCODONE-ACETAMINOPHEN 7.5-325 MG PO TABS: 1.0000 | ORAL_TABLET | Freq: Two times a day (BID) | ORAL | Status: DC | PRN

## 2015-06-28 MED ORDER — CANAGLIFLOZIN 300 MG PO TABS: 300.0000 mg | ORAL_TABLET | Freq: Every day | ORAL | Status: DC

## 2015-06-28 MED ORDER — CLONAZEPAM 1 MG PO TABS: 1.0000 mg | ORAL_TABLET | Freq: Two times a day (BID) | ORAL | Status: DC | PRN

## 2015-06-28 MED ORDER — GLUCOSE BLOOD VI STRP: ORAL_STRIP | Status: DC

## 2015-06-28 NOTE — Progress Notes (Signed)
By signing my name below, I, Moises Blood, attest that this documentation has been prepared under the direction and in the presence of Robyn Haber, MD. Electronically Signed: Moises Blood, [place]. 06/28/2015 , 11:53 AM .  Patient was seen in room 12 .   Patient ID: Ann Lopez MRN: [id number], DOB: 03/05/1971, 45 y.o. Date of Encounter: 06/28/2015  Primary Physician: Robyn Haber, MD  Chief Complaint:  Chief Complaint  Patient presents with  . Medication Refill    norco     HPI:  Ann Lopez is a 45 y.o. female who presents to [facility] for medication refill on her norco. She needs an MRI done before she can be seen at the pain clinic.   She was previously seen by Philis Fendt, PA-C on 06/17/15 for bad case of the flu. She was sent out by EMS to [facility] ED. She reports that she had a bad experience at [facility]   Her sugar was running higher than usual during her illness. She has noticed some numbness in her left leg, pointing to the lateral aspect of her upper leg. She denies any issues with her vision.   Past Medical History  Diagnosis Date  . Diabetes mellitus   . Back pain   . Osteopenia   . Allergy   . Asthma   . Cancer ([place])   . Breast cancer ([place])   . Depression   . Anxiety   . Osteoporosis     osteopenia  . Headache   . Hyperlipemia      Home Meds: Prior to Admission medications   Medication Sig Start Date End Date Taking? Authorizing Provider  albuterol (PROVENTIL HFA;VENTOLIN HFA) 108 (90 BASE) MCG/ACT inhaler Inhale 1 puff into the lungs every 6 (six) hours as needed for wheezing or shortness of breath. 09/19/14   Robyn Haber, MD  albuterol (PROVENTIL) (2.5 MG/3ML) 0.083% nebulizer solution Take 3 mLs (2.5 mg total) by nebulization every 6 (six) hours as needed for wheezing or shortness of breath. Q000111Q   Delora Fuel, MD  canagliflozin [facility]) 300 MG TABS tablet Take 300 mg by mouth daily before  breakfast. 09/19/14   Robyn Haber, MD  Choline Fenofibrate (FENOFIBRIC ACID) 135 MG CPDR TAKE ONE CAPSULE EVERY DAY 05/18/15   Robyn Haber, MD  clonazePAM (KLONOPIN) 1 MG tablet Take 1 tablet (1 mg total) by mouth 2 (two) times daily as needed for anxiety. 09/19/14   Robyn Haber, MD  desvenlafaxine (PRISTIQ) 100 MG 24 hr tablet Take 1 tablet (100 mg total) by mouth daily. 09/19/14   Robyn Haber, MD  dextromethorphan-guaiFENesin [facility] DM) 30-600 MG 12hr tablet Take 1 tablet by mouth 2 (two) times daily. 06/17/15   Anne Ng, PA-C  HYDROcodone-acetaminophen (NORCO) 7.5-325 MG tablet Take 1 tablet by mouth 2 (two) times daily as needed for moderate pain. 05/24/15   Robyn Haber, MD  ibuprofen (ADVIL,MOTRIN) 200 MG tablet Take 800 mg by mouth every 6 (six) hours as needed for fever.    Historical Provider, MD  ibuprofen (ADVIL,MOTRIN) 800 MG tablet Take 1 tablet (800 mg total) by mouth 3 (three) times daily. 06/17/15   Olivia Canter Sam, PA-C  mometasone (NASONEX) 50 MCG/ACT nasal spray Place 2 sprays into the nose daily. 01/20/15   Robyn Haber, MD  omeprazole (PRILOSEC) 20 MG capsule Take 1 capsule (20 mg total) by mouth daily. 03/23/15   Robyn Haber, MD  oseltamivir (TAMIFLU) 75 MG capsule Take 1 capsule (75  mg total) by mouth every 12 (twelve) hours. 06/17/15   Olivia Canter Sam, PA-C  zonisamide (ZONEGRAN) 100 MG capsule Take 1 capsule (100 mg total) by mouth daily. 03/23/15   Robyn Haber, MD    Allergies:  Allergies  Allergen Reactions  . Azithromycin     Unknown  . Morphine And Related     Can tolerate hydrocodone and dilaudid without side effects  . Nitrofurantoin     Unknown  . Penicillins Hives and Itching  . Sulfonamide Derivatives Hives    Social History   Social History  . Marital Status: Single    Spouse Name: N/A  . Number of Children: 4  . Years of Education: 13   Occupational History  . Disabled    Social History Main Topics  . Smoking status:  Current Some Day Smoker    Types: Cigarettes  . Smokeless tobacco: Never Used  . Alcohol Use: No  . Drug Use: No  . Sexual Activity: Not Currently   Other Topics Concern  . Not on file   Social History Narrative   Lives at home with her four sons.   Right-handed.   2-3 cups caffeine daily.     Review of Systems: Constitutional: negative for fever, chills, night sweats, weight changes, or fatigue  HEENT: negative for vision changes, hearing loss, congestion, rhinorrhea, ST, epistaxis, or sinus pressure Cardiovascular: negative for chest pain or palpitations Respiratory: negative for hemoptysis, wheezing, shortness of breath, or cough Abdominal: negative for abdominal pain, nausea, vomiting, diarrhea, or constipation Dermatological: negative for rash Neurologic: negative for headache, dizziness, or syncope; positive for numbness (left leg)  All other systems reviewed and are otherwise negative with the exception to those above and in the HPI.  Physical Exam: Blood pressure 110/76, pulse 76, temperature 98.2 F (36.8 C), temperature source Oral, resp. rate 17, height 5\' 2"  (1.575 m), weight 187 lb (84.823 kg), SpO2 98 %., Body mass index is 34.19 kg/(m^2). General: Well developed, well nourished, in no acute distress. Head: Normocephalic, atraumatic, eyes without discharge, sclera non-icteric, nares are without discharge. Bilateral auditory canals clear, TM's are without perforation, pearly grey and translucent with reflective cone of light bilaterally. Oral cavity moist, posterior pharynx without exudate, erythema, peritonsillar abscess, or post nasal drip.  Neck: Supple. No thyromegaly. Full ROM. No lymphadenopathy. Lungs: Clear bilaterally to auscultation without wheezes, rales, or rhonchi. Breathing is unlabored. Heart: RRR with S1 S2. No murmurs, rubs, or gallops appreciated. Msk:  Strength and tone normal for age. Extremities/Skin: Warm and dry. No clubbing or cyanosis. No  edema. No rashes or suspicious lesions.  The feet appear to be normal with normal sensation and no skin breaks or calluses Neuro: Alert and oriented X 3. Moves all extremities spontaneously. Gait is normal. CNII-XII grossly in tact. Psych:  Responds to questions appropriately with a normal affect.     ASSESSMENT AND PLAN:  45 y.o. year old female with  This chart was scribed in my presence and reviewed by me personally.    ICD-9-CM ICD-10-CM   1. Chronic low back pain 724.2 M54.5 MR Lumbar Spine Wo Contrast   338.29 G89.29 HYDROcodone-acetaminophen (NORCO) 7.5-325 MG tablet  2. Type 2 diabetes mellitus without complication, without long-term current use of insulin (H[facility]) 250.00 E11.9 canagliflozin (INVOKANA) 300 MG TABS tablet     glucose blood (CHOICE DM FORA G20 TEST STRIPS) test strip  3. Panic anxiety syndrome 300.01 F41.0 clonazePAM (KLONOPIN) 1 MG tablet  4. Other back pain 724.5  M54.89 HYDROcodone-acetaminophen (NORCO) 7.5-325 MG tablet  5. Pain, dental 525.9 K08.89 HYDROcodone-acetaminophen (NORCO) 7.5-325 MG tablet       Signed, Robyn Haber, MD 06/28/2015 11:53 AM

## 2015-06-28 NOTE — Patient Instructions (Signed)
I believe Dr. Horald Pollen is working and [place] now with Dr. Claris Gower.  I believe Dr. Lamar Blinks is working on the corner of [address] and [address], Systems developer, in the [place] office

## 2015-06-28 NOTE — Telephone Encounter (Signed)
Amber from [facility] called in unsure of what-CHOICE DM FORA G20 TEST STRIPS) test strip [id number]. CB # [phone]

## 2015-07-09 ENCOUNTER — Inpatient Hospital Stay: Admission: RE | Admit: 2015-07-09 | Payer: Self-pay | Source: Ambulatory Visit

## 2015-07-28 ENCOUNTER — Ambulatory Visit (INDEPENDENT_AMBULATORY_CARE_PROVIDER_SITE_OTHER): Payer: Medicare Other | Admitting: Family Medicine

## 2015-07-28 VITALS — BP 116/72 | HR 88 | Temp 97.9°F | Resp 18 | Ht 62.0 in | Wt 190.0 lb

## 2015-07-28 DIAGNOSIS — M545 Low back pain, unspecified: Secondary | ICD-10-CM

## 2015-07-28 DIAGNOSIS — K0889 Other specified disorders of teeth and supporting structures: Secondary | ICD-10-CM

## 2015-07-28 DIAGNOSIS — F341 Dysthymic disorder: Secondary | ICD-10-CM | POA: Diagnosis not present

## 2015-07-28 DIAGNOSIS — F410 Panic disorder [episodic paroxysmal anxiety] without agoraphobia: Secondary | ICD-10-CM | POA: Diagnosis not present

## 2015-07-28 DIAGNOSIS — J4520 Mild intermittent asthma, uncomplicated: Secondary | ICD-10-CM | POA: Diagnosis not present

## 2015-07-28 DIAGNOSIS — E119 Type 2 diabetes mellitus without complications: Secondary | ICD-10-CM | POA: Diagnosis not present

## 2015-07-28 DIAGNOSIS — R413 Other amnesia: Secondary | ICD-10-CM

## 2015-07-28 DIAGNOSIS — M5489 Other dorsalgia: Secondary | ICD-10-CM

## 2015-07-28 DIAGNOSIS — G8929 Other chronic pain: Secondary | ICD-10-CM | POA: Diagnosis not present

## 2015-07-28 LAB — POCT GLYCOSYLATED HEMOGLOBIN (HGB A1C): Hemoglobin A1C: 11.3

## 2015-07-28 MED ORDER — HYDROCODONE-ACETAMINOPHEN 7.5-325 MG PO TABS: 1.0000 | ORAL_TABLET | Freq: Two times a day (BID) | ORAL | Status: DC | PRN

## 2015-07-28 MED ORDER — DESVENLAFAXINE SUCCINATE ER 100 MG PO TB24: 100.0000 mg | ORAL_TABLET | Freq: Every day | ORAL | Status: DC

## 2015-07-28 MED ORDER — ALBUTEROL SULFATE HFA 108 (90 BASE) MCG/ACT IN AERS: 1.0000 | INHALATION_SPRAY | Freq: Four times a day (QID) | RESPIRATORY_TRACT | Status: DC | PRN

## 2015-07-28 MED ORDER — PRENATAL VITAMINS 28-0.8 MG PO TABS: 1.0000 | ORAL_TABLET | Freq: Every day | ORAL | Status: DC

## 2015-07-28 NOTE — Patient Instructions (Addendum)
I'm counting on you to remember to take your medicine for diabetes. If you don't, will going to have to go back to shots. Otherwise the high blood sugars will have an impact on your general health and possibly even your memory.

## 2015-07-28 NOTE — Progress Notes (Signed)
Ann Lopez is a 45 y.o. female who presents to the [facility] for medication refill (Omeprazole, Hydrocodone). She is also complaining of an intermittent cough which she describes it to be at the point of not being able to breathe and has to catch her breath onset three weeks ago. Her cough has led her to feel tightness in her chest afterwards and can hear herself wheezing. She has associated symptoms of nasal congestion and states her right ear feels stuffy with pressure. Patient uses Flonase. She is currently taking Topamax (for her migraines) but feels like her memory has been worse than before. Patient is no longer working.   Depression/ anxiety: She states that the Xanax she used to take would help her sleep. Her panic attacks have come back since she has stopped taking it and is wondering if she can take another Klonopin (which she already takes two times/day) She states that she is getting very stressed out and feels like she is taking it out on her children. Her panic attacks have increased to the point where she is not able to go into a store. Patient notes that she slept two hours last night. Patient notes that she feels that she has been depressed for "a couple of years". Not sleeping well as a rule.    Migraine: She has headaches mainly when she wakes up in the mornings but thinks that her other headaches are due to not drinking enough water.   We took her off her old migraine medicine to see if it would help her memory problems.  She gets every other day left occipital headaches.  Patient has a history of breast cancer.  She has memory problems since her chemotherapy.  She cannot read books because she cannot remember what she just read.  Diabetes:  She forgets to take her medicine sometimes.  No polyuria, dizziness, blurred vision, or dry mouth  Objective: Patient is cheerful and appropriate.vs BP 116/72 mmHg  Pulse 88  Temp(Src) 97.9 F (36.6 C)  (Oral)  Resp 18  Ht 5\' 2"  (1.575 m)  Wt 190 lb (86.183 kg)  BMI 34.74 kg/m2  SpO2 98% Chest: Clear Heart: Regular no murmur Results for orders placed or performed in visit on 07/28/15  POCT glycosylated hemoglobin (Hb A1C)  Result Value Ref Range   Hemoglobin A1C 11.3      Panic anxiety syndrome - Plan: Prenatal Vit-Fe Fumarate-FA (PRENATAL VITAMINS) 28-0.8 MG TABS  Asthma, chronic, mild intermittent, uncomplicated - Plan: albuterol (PROVENTIL HFA;VENTOLIN HFA) 108 (90 Base) MCG/ACT inhaler, Prenatal Vit-Fe Fumarate-FA (PRENATAL VITAMINS) 28-0.8 MG TABS  Type 2 diabetes mellitus without complication, without long-term current use of insulin (HCC) - Plan: Prenatal Vit-Fe Fumarate-FA (PRENATAL VITAMINS) 28-0.8 MG TABS, POCT glycosylated hemoglobin (Hb A1C)  Other back pain - Plan: HYDROcodone-acetaminophen (NORCO) 7.5-325 MG tablet, Prenatal Vit-Fe Fumarate-FA (PRENATAL VITAMINS) 28-0.8 MG TABS  Chronic low back pain - Plan: HYDROcodone-acetaminophen (NORCO) 7.5-325 MG tablet, Prenatal Vit-Fe Fumarate-FA (PRENATAL VITAMINS) 28-0.8 MG TABS  Pain, dental - Plan: HYDROcodone-acetaminophen (NORCO) 7.5-325 MG tablet, Prenatal Vit-Fe Fumarate-FA (PRENATAL VITAMINS) 28-0.8 MG TABS  DEPRESSION/ANXIETY - Plan: desvenlafaxine (PRISTIQ) 100 MG 24 hr tablet, Prenatal Vit-Fe Fumarate-FA (PRENATAL VITAMINS) 28-0.8 MG TABS  Panic - Plan: desvenlafaxine (PRISTIQ) 100 MG 24 hr tablet, Prenatal Vit-Fe Fumarate-FA (PRENATAL VITAMINS) 28-0.8 MG TABS  Memory loss - Plan: Ambulatory referral to Neurology  Robyn Haber, MD

## 2015-08-25 ENCOUNTER — Encounter: Payer: Self-pay | Admitting: Neurology

## 2015-08-25 ENCOUNTER — Ambulatory Visit (INDEPENDENT_AMBULATORY_CARE_PROVIDER_SITE_OTHER): Payer: Medicare Other | Admitting: Neurology

## 2015-08-25 ENCOUNTER — Other Ambulatory Visit (INDEPENDENT_AMBULATORY_CARE_PROVIDER_SITE_OTHER): Payer: Medicare Other

## 2015-08-25 VITALS — BP 102/60 | HR 85 | Ht 62.0 in | Wt 184.0 lb

## 2015-08-25 DIAGNOSIS — F329 Major depressive disorder, single episode, unspecified: Secondary | ICD-10-CM

## 2015-08-25 DIAGNOSIS — R413 Other amnesia: Secondary | ICD-10-CM | POA: Diagnosis not present

## 2015-08-25 DIAGNOSIS — F32A Depression, unspecified: Secondary | ICD-10-CM

## 2015-08-25 LAB — VITAMIN B12: Vitamin B-12: 312 pg/mL (ref 211–911)

## 2015-08-25 LAB — TSH: TSH: 1.02 u[IU]/mL (ref 0.35–4.50)

## 2015-08-25 NOTE — Progress Notes (Signed)
NEUROLOGY CONSULTATION NOTE  Ann Lopez MRN: [id number] DOB: 1970/09/29  Referring provider: Dr. Joseph Art Primary care provider: Dr. Joseph Art  Reason for consult:  memory  HISTORY OF PRESENT ILLNESS: Ann Lopez is a 45 year old right-handed woman with depression, anxiety, migraine, diabetes and history of breast cancer who presents for memory problems.  History obtained by patient and PCP note.  Labs reviewed.  She reports that she started having memory problems since undergoing chemotherapy for breast cancer in 2009.  At first, they told her it was "chemo brain" but it never improved.  However, it hasn't gotten worse either.  She reports short-term memory problems.  She forgets everything people tell her.  Sometimes, she thinks her teenage son takes advantage of it by saying that she told him it was okay to do something when she doesn't remember.  She loses her train of thought midway during a conversation and will have to stop talking and backtrack.  She quickly forgets what she just reads.  She lives with her sons and pays the bills.  She reports she often forgets some payments.  She often forgets to take her medications or does not remember taking them, despite having tried a pillbox in the past.  She also reports increased difficulty remembering old recipes while cooking.  She does not get lost driving on familiar routes.  She denies problems recognizing friends and family.  She is able to perform all activities of daily living.  She reports significant depression.  She takes Pristiq, which she feels is no longer helpful.  She takes Klonopin daily for anxiety.  She has chronic back pain, for which she takes Norco daily.    She had some college but never graduated.  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  She currently is taking an on-line accounting course and reports difficulty recalling and retaining the information.   She has no known family history of dementia.  Recent CBC was unremarkable.  Recent BMP showed Na 133 and glucose 254.  A1c was 11.3.    B12 from 2013 was 446 and TSH from 2014 was 1.882.  PAST MEDICAL HISTORY: Past Medical History  Diagnosis Date  . Diabetes mellitus   . Back pain   . Osteopenia   . Allergy   . Asthma   . Cancer ([place])   . Breast cancer ([place])   . Depression   . Anxiety   . Osteoporosis     osteopenia  . Headache   . Hyperlipemia     PAST SURGICAL HISTORY: Past Surgical History  Procedure Laterality Date  . Mastectomy Bilateral   . Abdominal hysterectomy    . Lymphadenectomy    . Cesarean section    . Breast surgery    . Incision and drainage abscess Left 11/14/2012    Procedure: INCISION AND DRAINAGE ABSCESS LEFT GROIN;  Surgeon: Jamesetta So, MD;  Location: [facility];  Service: General;  Laterality: Left;    MEDICATIONS: Current Outpatient Prescriptions on File Prior to Visit  Medication Sig Dispense Refill  . albuterol (PROVENTIL HFA;VENTOLIN HFA) 108 (90 Base) MCG/ACT inhaler Inhale 1 puff into the lungs every 6 (six) hours as needed for wheezing or shortness of breath. 1 Inhaler 6  . albuterol (PROVENTIL) (2.5 MG/3ML) 0.083% nebulizer solution Take 3 mLs (2.5 mg total) by nebulization every 6 (six) hours as needed for wheezing or shortness of breath. 75 mL 12  .  canagliflozin (INVOKANA) 300 MG TABS tablet Take 1 tablet (300 mg total) by mouth daily before breakfast. 90 tablet 3  . Choline Fenofibrate (FENOFIBRIC ACID) 135 MG CPDR TAKE ONE CAPSULE EVERY DAY 90 capsule 0  . clonazePAM (KLONOPIN) 1 MG tablet Take 1 tablet (1 mg total) by mouth 2 (two) times daily as needed for anxiety. 180 tablet 1  . desvenlafaxine (PRISTIQ) 100 MG 24 hr tablet Take 1 tablet (100 mg total) by mouth daily. 90 tablet 3  . glucose blood (CHOICE DM FORA G20 TEST STRIPS) test strip Use as instructed 100 each 12  . HYDROcodone-acetaminophen (NORCO) 7.5-325 MG tablet Take 1  tablet by mouth 2 (two) times daily as needed for moderate pain. 60 tablet 0  . ibuprofen (ADVIL,MOTRIN) 200 MG tablet Take 800 mg by mouth every 6 (six) hours as needed for fever. Reported on 07/28/2015    . ibuprofen (ADVIL,MOTRIN) 800 MG tablet Take 1 tablet (800 mg total) by mouth 3 (three) times daily. 21 tablet 0  . mometasone (NASONEX) 50 MCG/ACT nasal spray Place 2 sprays into the nose daily. 17 g 12  . omeprazole (PRILOSEC) 20 MG capsule Take 1 capsule (20 mg total) by mouth daily. 30 capsule 1  . Prenatal Vit-Fe Fumarate-FA (PRENATAL VITAMINS) 28-0.8 MG TABS Take 1 tablet by mouth daily. 30 tablet 3  . [DISCONTINUED] fluticasone (FLONASE) 50 MCG/ACT nasal spray Place 2 sprays into the nose daily. (Patient not taking: Reported on 03/10/2014) 1 g 12   No current facility-administered medications on file prior to visit.    ALLERGIES: Allergies  Allergen Reactions  . Azithromycin     Unknown  . Morphine And Related     Can tolerate hydrocodone and dilaudid without side effects  . Nitrofurantoin     Unknown  . Penicillins Hives and Itching  . Sulfonamide Derivatives Hives    FAMILY HISTORY: Family History  Problem Relation Age of Onset  . Cancer Mother 29    breast cancer  . Hypertension Sister   . Hypothyroidism Brother   . Cancer Maternal Grandmother 58    breast cancer  . Cancer Maternal Grandfather 50    pancreatic cancer    SOCIAL HISTORY: Social History   Social History  . Marital Status: Single    Spouse Name: N/A  . Number of Children: 4  . Years of Education: 13   Occupational History  . Disabled    Social History Main Topics  . Smoking status: Current Some Day Smoker    Types: Cigarettes  . Smokeless tobacco: Never Used  . Alcohol Use: No  . Drug Use: No  . Sexual Activity: Not Currently   Other Topics Concern  . Not on file   Social History Narrative   Lives at home with her four sons.   Right-handed.   2-3 cups caffeine daily.    REVIEW  OF SYSTEMS: Constitutional: No fevers, chills, or sweats, no generalized fatigue, change in appetite Eyes: No visual changes, double vision, eye pain Ear, nose and throat: No hearing loss, ear pain, nasal congestion, sore throat Cardiovascular: No chest pain, palpitations Respiratory:  No shortness of breath at rest or with exertion, wheezes GastrointestinaI: No nausea, vomiting, diarrhea, abdominal pain, fecal incontinence Genitourinary:  No dysuria, urinary retention or frequency Musculoskeletal:  No neck pain, back pain Integumentary: No rash, pruritus, skin lesions Neurological: as above Psychiatric: depression, insomnia, anxiety Endocrine: No palpitations, fatigue, diaphoresis, mood swings, change in appetite, change in weight, increased thirst Hematologic/Lymphatic:  No purpura, petechiae. Allergic/Immunologic: no itchy/runny eyes, nasal congestion, recent allergic reactions, rashes  PHYSICAL EXAM: Filed Vitals:   08/25/15 1000  BP: 102/60  Pulse: 85   General: No acute distress.  Patient appears well-groomed.  Head:  Normocephalic/atraumatic Eyes:  fundi examined but not visualized Neck: supple, no paraspinal tenderness, full range of motion Back: No paraspinal tenderness Heart: regular rate and rhythm Lungs: Clear to auscultation bilaterally. Vascular: No carotid bruits. Neurological Exam: Mental status: alert and oriented to person, place, and time, delayed recall 3/5, remote memory intact, fund of knowledge intact, attention and concentration intact, speech fluent and not dysarthric, language intact.  Deficiency in executive functioning is demonstrated with incorrectly completing the Trail Making Test, copying a cube and difficulty with naming fluency.   Montreal Cognitive Assessment  08/25/2015  Visuospatial/ Executive (0/5) 2  Naming (0/3) 3  Attention: Read list of digits (0/2) 2  Attention: Read list of letters (0/1) 1  Attention: Serial 7 subtraction starting at 100  (0/3) 3  Language: Repeat phrase (0/2) 1  Language : Fluency (0/1) 0  Abstraction (0/2) 2  Delayed Recall (0/5) 3  Orientation (0/6) 6  Total 23  Adjusted Score (based on education) 23   Cranial nerves: CN I: not tested CN II: pupils equal, round and reactive to light, visual fields intact CN III, IV, VI:  full range of motion, no nystagmus, no ptosis CN V: facial sensation intact CN VII: upper and lower face symmetric CN VIII: hearing intact CN IX, X: gag intact, uvula midline CN XI: sternocleidomastoid and trapezius muscles intact CN XII: tongue midline Bulk & Tone: normal, no fasciculations. Motor:  5/5 throughout  Sensation: temperature and vibration sensation intact. Deep Tendon Reflexes:  2+ throughout, toes downgoing.  Finger to nose testing:  Without dysmetria.  Heel to shin:  Without dysmetria.  Gait:  Normal station and stride.  Able to turn and tandem walk. Romberg negative.  IMPRESSION: Memory deficits.  Testing demonstrates deficiency primarily with executive functioning.  She has risk factor for cerebrovascular disease in regards to uncontrolled diabetes, so this potentially could be a cause.  However, at this point I really don't suspect organic cognitive impairment.  I think depression and pharmacologic effect are the likely causes (Klonopin, Norco).    PLAN: 1.  Neuropsych testing 2.  Check B12 and TSH 3.  Follow up afterwards  Thank you for allowing me to take part in the care of this patient.  Metta Clines, DO  CC:  Robyn Haber, MD

## 2015-08-25 NOTE — Patient Instructions (Signed)
I think the memory problems are due to depression and side effects of medication.  However, since it is affecting your life in a negative way, we will evaluate further with a neuropsychological test.  Follow up with me afterwards.  I also would like to check B12 and TSH.

## 2015-08-29 ENCOUNTER — Telehealth: Payer: Self-pay

## 2015-08-29 NOTE — Telephone Encounter (Signed)
Message relayed to patient. Verbalized understanding and denied questions.   

## 2015-08-29 NOTE — Telephone Encounter (Signed)
-----   Message from Pieter Partridge, DO sent at 08/25/2015  8:23 PM EDT ----- Thyroid is normal. B12 is normal but on the low-end of normal. I recommend taking B12 1021mcg daily as low B12 may cause memory problems

## 2015-08-30 ENCOUNTER — Ambulatory Visit (INDEPENDENT_AMBULATORY_CARE_PROVIDER_SITE_OTHER): Payer: Medicare Other | Admitting: Family Medicine

## 2015-08-30 VITALS — BP 104/66 | HR 95 | Temp 98.3°F | Resp 16 | Ht 62.0 in | Wt 184.0 lb

## 2015-08-30 DIAGNOSIS — M545 Low back pain: Secondary | ICD-10-CM

## 2015-08-30 DIAGNOSIS — K0889 Other specified disorders of teeth and supporting structures: Secondary | ICD-10-CM

## 2015-08-30 DIAGNOSIS — M5489 Other dorsalgia: Secondary | ICD-10-CM

## 2015-08-30 DIAGNOSIS — G8929 Other chronic pain: Secondary | ICD-10-CM | POA: Diagnosis not present

## 2015-08-30 DIAGNOSIS — F32A Depression, unspecified: Secondary | ICD-10-CM

## 2015-08-30 DIAGNOSIS — F329 Major depressive disorder, single episode, unspecified: Secondary | ICD-10-CM | POA: Diagnosis not present

## 2015-08-30 MED ORDER — DULOXETINE HCL 30 MG PO CPEP: 30.0000 mg | ORAL_CAPSULE | Freq: Every day | ORAL | Status: DC

## 2015-08-30 MED ORDER — HYDROCODONE-ACETAMINOPHEN 7.5-325 MG PO TABS: 1.0000 | ORAL_TABLET | Freq: Two times a day (BID) | ORAL | Status: DC | PRN

## 2015-08-30 NOTE — Progress Notes (Signed)
45 yo woman who recently saw neurologist for memory lapses, initially thought by patient to be from chemotherapy following treatment for breast cancer.  Neurology felt patient may have problems with executive function secondary to narcotic and benzodiazepine use.  <B12 and thyroid function wnl>  In addition, her type 2 diabetes has not been well controlled.  Past surgeries include: H/o gastric bypass in 2013  The last month or so patient has experienced increasing depression. She can't sleep, is having panic attacks in the afternoons more 5 times a week. She's getting more irritable with her children.  Patient has been treated by Dr. Reece Levy who she felt was just overmedicating her with very heavy antipsychotics. She's not done well with Wellbutrin, and has failed treatment with Lexapro and Abilify. She was going to go to [employer] [facility] [employer] [facility] but her breast cancer and back pain have interrupted her studies.  Objective:  NAD Patient is tearful when discussing her back pain. I spent over 30 minutes face-to-face the patient trying to review her medical history and get the best anti- depressant for her.  The stenotic take some work to get her in better shape.  Assessment:  We went to change her medication. I will have her follow-up in 3 weeks.  Other back pain - Plan: HYDROcodone-acetaminophen (NORCO) 7.5-325 MG tablet  Chronic low back pain - Plan: HYDROcodone-acetaminophen (NORCO) 7.5-325 MG tablet  Pain, dental - Plan: HYDROcodone-acetaminophen (NORCO) 7.5-325 MG tablet  Depression - Plan: DULoxetine (CYMBALTA) 30 MG capsule  Robyn Haber, MD

## 2015-08-30 NOTE — Patient Instructions (Addendum)
Please follow-up in 3 weeks with Dr. Brigitte Pulse. Let us know if you have nausea or the anxiety is getting to be too much.

## 2015-09-07 ENCOUNTER — Other Ambulatory Visit: Payer: Self-pay

## 2015-09-22 ENCOUNTER — Encounter: Payer: Self-pay | Admitting: Psychology

## 2015-09-29 ENCOUNTER — Ambulatory Visit (INDEPENDENT_AMBULATORY_CARE_PROVIDER_SITE_OTHER): Payer: Medicare Other | Admitting: Family Medicine

## 2015-09-29 VITALS — BP 122/78 | HR 88 | Temp 98.1°F | Resp 17 | Ht 62.0 in | Wt 188.0 lb

## 2015-09-29 DIAGNOSIS — M545 Low back pain, unspecified: Secondary | ICD-10-CM

## 2015-09-29 DIAGNOSIS — K0889 Other specified disorders of teeth and supporting structures: Secondary | ICD-10-CM

## 2015-09-29 DIAGNOSIS — F329 Major depressive disorder, single episode, unspecified: Secondary | ICD-10-CM | POA: Diagnosis not present

## 2015-09-29 DIAGNOSIS — Z114 Encounter for screening for human immunodeficiency virus [HIV]: Secondary | ICD-10-CM

## 2015-09-29 DIAGNOSIS — L298 Other pruritus: Secondary | ICD-10-CM | POA: Diagnosis not present

## 2015-09-29 DIAGNOSIS — J0101 Acute recurrent maxillary sinusitis: Secondary | ICD-10-CM

## 2015-09-29 DIAGNOSIS — G8929 Other chronic pain: Secondary | ICD-10-CM

## 2015-09-29 DIAGNOSIS — B3731 Acute candidiasis of vulva and vagina: Secondary | ICD-10-CM

## 2015-09-29 DIAGNOSIS — N898 Other specified noninflammatory disorders of vagina: Secondary | ICD-10-CM

## 2015-09-29 DIAGNOSIS — B373 Candidiasis of vulva and vagina: Secondary | ICD-10-CM

## 2015-09-29 DIAGNOSIS — M5489 Other dorsalgia: Secondary | ICD-10-CM

## 2015-09-29 DIAGNOSIS — E119 Type 2 diabetes mellitus without complications: Secondary | ICD-10-CM

## 2015-09-29 DIAGNOSIS — F32A Depression, unspecified: Secondary | ICD-10-CM

## 2015-09-29 LAB — POCT WET + KOH PREP
TRICH BY WET PREP: ABSENT
Yeast by KOH: ABSENT

## 2015-09-29 LAB — POCT CBC
Granulocyte percent: 63.8 %G (ref 37–80)
HEMATOCRIT: 46.3 % (ref 37.7–47.9)
HEMOGLOBIN: 16.6 g/dL — AB (ref 12.2–16.2)
LYMPH, POC: 3.5 — AB (ref 0.6–3.4)
MCH, POC: 29.5 pg (ref 27–31.2)
MCHC: 35.7 g/dL — AB (ref 31.8–35.4)
MCV: 82.6 fL (ref 80–97)
MID (cbc): 0.5 (ref 0–0.9)
MPV: 7.7 fL (ref 0–99.8)
POC GRANULOCYTE: 7.1 — AB (ref 2–6.9)
POC LYMPH %: 31.5 % (ref 10–50)
POC MID %: 4.7 % (ref 0–12)
Platelet Count, POC: 265 10*3/uL (ref 142–424)
RBC: 5.6 M/uL — AB (ref 4.04–5.48)
RDW, POC: 14.5 %
WBC: 11.1 10*3/uL — AB (ref 4.6–10.2)

## 2015-09-29 LAB — GLUCOSE, POCT (MANUAL RESULT ENTRY): POC Glucose: 208 mg/dl — AB (ref 70–99)

## 2015-09-29 MED ORDER — HYDROCODONE-ACETAMINOPHEN 7.5-325 MG PO TABS: 1.0000 | ORAL_TABLET | Freq: Two times a day (BID) | ORAL | Status: DC | PRN

## 2015-09-29 MED ORDER — DULOXETINE HCL 60 MG PO CPEP: 60.0000 mg | ORAL_CAPSULE | Freq: Every day | ORAL | Status: DC

## 2015-09-29 MED ORDER — LEVOFLOXACIN 750 MG PO TABS: 750.0000 mg | ORAL_TABLET | Freq: Every day | ORAL | Status: DC

## 2015-09-29 MED ORDER — TERCONAZOLE 0.4 % VA CREA: 1.0000 | TOPICAL_CREAM | Freq: Every day | VAGINAL | Status: DC

## 2015-09-29 MED ORDER — FLUCONAZOLE 150 MG PO TABS: 150.0000 mg | ORAL_TABLET | Freq: Once | ORAL | Status: DC

## 2015-09-29 NOTE — Progress Notes (Signed)
Subjective:    Patient ID: Ann Lopez, female    DOB: 1970-09-12, 45 y.o.   MRN: [id number]  09/29/2015  URI   HPI This 45 y.o. female presents for evaluation of the following:   1. Cold symptoms:  Head congestion.  Has taken OTC medications for one week.  No fever but +cold sweats.  +chronic headaches; no worsening. L ear pain.  +nasal congestion; +chest tightness.  +wheezing; +albuterol twice or three times.  Has been using Dayquil and [place] sinus.  Mucinex.  2. Yeast infection: onset four days ago.    1.  DMII: Patient reports good compliance with medication, good tolerance to medication, and good symptom control.  Invokana 320m/fenofibrate/  2.  Asthma: Patient reports good compliance with medication, good tolerance to medication, and good symptom control.    3.  Anxiety and depression: Patient reports good compliance with medication, good tolerance to medication, and good symptom control.  Has severe panic attacks.  Previous management by Dr. RReece Levy  Dr. RDarron Doomfound combination of Cymbalta and Klonopin.  Previous Pristiq with RForensic psychologist  Dr. LJoseph Artrequired patient pick Xanax OR Klonopin; picked Klonopin.  Does not sleep well.  Was having one panic attack weekly or every other week.  Just recently weaned Xanax in six months ago.  Just started Cymbalta 3108mone month ago; not helping.  No side effects to Cymbalta.  Refuses to use Geodon; was drowsy for 18 hours.    4.  Allergic Rhinitis: Patient reports good compliance with medication, good tolerance to medication, and good symptom control.    5.  GERD: Patient reports good compliance with medication, good tolerance to medication, and good symptom control.    6.  Lower back pain:  Duration for years.  Has osteopenia.  Followed once monthly; awaiting appointment for MRI lumbar spine.  Referred to pain management on 06/08/2015.  Hydrocodone bid.  7. Osteopenia/osteoporosis: onset after hysterectomy  8. History of breast  cancer/BRCA+: s/p hysterectomy.  S/p chemotherapy; s[place] B [place]astectomy. [place]   9. Memory loss: referred to neurology; neurology feels that depression is cause of memory loss.  Oncology blamed chemo brain for memory loss.     Review of Systems  Constitutional: Negative for fever, chills, diaphoresis and fatigue.  HENT: Positive for congestion and ear pain.   Eyes: Negative for visual disturbance.  Respiratory: Positive for chest tightness and wheezing. Negative for cough and shortness of breath.   Cardiovascular: Negative for chest pain, palpitations and leg swelling.  Gastrointestinal: Negative for nausea, vomiting, abdominal pain, diarrhea and constipation.  Endocrine: Negative for cold intolerance, heat intolerance, polydipsia, polyphagia and polyuria.  Genitourinary: Positive for vaginal discharge. Negative for vaginal pain.  Neurological: Negative for dizziness, tremors, seizures, syncope, facial asymmetry, speech difficulty, weakness, light-headedness, numbness and headaches.  Psychiatric/Behavioral: Positive for dysphoric mood. Negative for suicidal ideas, sleep disturbance and self-injury. The patient is not nervous/anxious.     Past Medical History  Diagnosis Date  . Diabetes mellitus   . Back pain   . Osteopenia   . Allergy   . Asthma   . [place]  . [place]  . Depression   . Anxiety   . Osteoporosis     osteopenia  . Headache   . Hyperlipemia    Past Surgical History  Procedure Laterality Date  . Mastectomy Bilateral   . Lymphadenectomy    . Cesarean section    . Breast surgery    .  Incision and drainage abscess Left 11/14/2012    Procedure: INCISION AND DRAINAGE ABSCESS LEFT GROIN;  Surgeon: Mark A Jenkins, MD;  Location: [facility];  Service: General;  Laterality: Left;  . Abdominal hysterectomy      B oophorectomy for BRCA1 gene   Allergies  Allergen Reactions  . Azithromycin     Unknown  . Morphine And Related     Can tolerate  hydrocodone and dilaudid without side effects  . Nitrofurantoin     Unknown  . Penicillins Hives and Itching  . Sulfonamide Derivatives Hives    Social History   Social History  . Marital Status: Single    Spouse Name: N/A  . Number of Children: 4  . Years of Education: 13   Occupational History  . Disabled    Social History Main Topics  . Smoking status: Current Some Day Smoker    Types: Cigarettes  . Smokeless tobacco: Never Used  . Alcohol Use: No  . Drug Use: No  . Sexual Activity: Not Currently   Other Topics Concern  . Not on file   Social History Narrative   Marital status: single      Lives: at home with her four sons.      Employment: disability for breast cancer, DDD lumbar, anxiety/panic attacks      Tobacco: 1 ppd      Alcohol: none         Right-handed.   2-3 cups caffeine daily.   Family History  Problem Relation Age of Onset  . Cancer Mother 32    breast cancer  . Hypertension Sister   . Hypothyroidism Brother   . Cancer Maternal Grandmother 55    breast cancer  . Cancer Maternal Grandfather 55    pancreatic cancer       Objective:    BP 122/78 mmHg  Pulse 88  Temp(Src) 98.1 F (36.7 C) (Oral)  Resp 17  Ht 5' 2" (1.575 m)  Wt 188 lb (85.276 kg)  BMI 34.38 kg/m2  SpO2 95% Physical Exam  Constitutional: She is oriented to person, place, and time. She appears well-developed and well-nourished. No distress.  HENT:  Head: Normocephalic and atraumatic.  Right Ear: External ear normal.  Left Ear: External ear normal.  Nose: Nose normal.  Mouth/Throat: Oropharynx is clear and moist.  Eyes: Conjunctivae and EOM are normal. Pupils are equal, round, and reactive to light.  Neck: Normal range of motion. Neck supple. Carotid bruit is not present. No thyromegaly present.  Cardiovascular: Normal rate, regular rhythm, normal heart sounds and intact distal pulses.  Exam reveals no gallop and no friction rub.   No murmur heard. Pulmonary/Chest:  Effort normal and breath sounds normal. She has no wheezes. She has no rales.  Abdominal: Soft. Bowel sounds are normal. She exhibits no distension and no mass. There is no tenderness. There is no rebound and no guarding.  Genitourinary: There is rash on the right labia. There is no tenderness, lesion or injury on the right labia. There is rash on the left labia. There is no tenderness, lesion or injury on the left labia. No erythema or bleeding in the vagina. No foreign body around the vagina. No signs of injury around the vagina. No vaginal discharge found.  Lymphadenopathy:    She has no cervical adenopathy.  Neurological: She is alert and oriented to person, place, and time. No cranial nerve deficit.  Skin: Skin is warm and dry. No rash noted. She is not   diaphoretic. No erythema. No pallor.  Psychiatric: She has a normal mood and affect. Her behavior is normal.        Assessment & Plan:   1. Depression   2. Acute recurrent maxillary sinusitis   3. Type 2 diabetes mellitus without complication, without long-term current use of insulin ([place])   4. Screening for HIV (human immunodeficiency virus)   5. Vaginal itching   6. Candidiasis of vulva   7. Other back pain   8. Chronic low back pain   9. Pain, dental     Orders Placed This Encounter  Procedures  . Comprehensive metabolic panel  . HIV antibody  . POCT Wet + KOH Prep  . POCT CBC  . POCT glucose (manual entry)   Meds ordered this encounter  Medications  . DULoxetine (CYMBALTA) 60 MG capsule    Sig: Take 1 capsule (60 mg total) by mouth daily.    Dispense:  30 capsule    Refill:  2  . levofloxacin (LEVAQUIN) 750 MG tablet    Sig: Take 1 tablet (750 mg total) by mouth daily.    Dispense:  7 tablet    Refill:  0  . terconazole (TERAZOL 7) 0.4 % vaginal cream    Sig: Place 1 applicator vaginally at bedtime.    Dispense:  45 g    Refill:  0  . HYDROcodone-acetaminophen (NORCO) 7.5-325 MG tablet    Sig: Take 1 tablet by  mouth 2 (two) times daily as needed for moderate pain.    Dispense:  60 tablet    Refill:  0  . fluconazole (DIFLUCAN) 150 MG tablet    Sig: Take 1 tablet (150 mg total) by mouth once. Repeat if needed    Dispense:  2 tablet    Refill:  0    Return in about 4 weeks (around 10/27/2015) for recheck.    Narya Beavin Elayne Guerin, M.D. [facility] [address] [place], [place]  [address] [phone] phone [phone] fax

## 2015-09-29 NOTE — Patient Instructions (Addendum)
[facility] Policy for Prescribing Controlled Substances (Revised 02/2012) 1. Prescriptions for controlled substances will be filled by ONE provider at [facility] with whom you have established and developed a plan for your care, including follow-up. 2. You are encouraged to schedule an appointment with your prescriber at our appointment center for follow-up visits whenever possible. 3. If you request a prescription for the controlled substance while at [facility] for an acute problem (with someone other than your regular prescriber), you MAY be given a ONE-TIME prescription for a 30-day supply of the controlled substance, to allow time for you to return to see your regular prescriber for additional prescriptions.    IF you received an x-ray today, you will receive an invoice from [facility]. Please contact [facility] at [phone] with questions or concerns regarding your invoice.   IF you received labwork today, you will receive an invoice from [employer]. Please contact [facility] at [phone] with questions or concerns regarding your invoice.   Our billing staff will not be able to assist you with questions regarding bills from these companies.  You will be contacted with the lab results as soon as they are available. The fastest way to get your results is to activate your My Chart account. Instructions are located on the last page of this paperwork. If you have not heard from [place] regarding the results in 2 weeks, please contact this office.    Sinusitis, Adult Sinusitis is redness, soreness, and inflammation of the paranasal sinuses. Paranasal sinuses are air pockets within the bones of your face. They are located beneath your eyes, in the middle of your forehead, and above your eyes. In healthy paranasal sinuses, mucus is able to drain out, and air is able to circulate through them by way of your nose. However, when your paranasal sinuses are inflamed, mucus and  air can become trapped. This can allow bacteria and other germs to grow and cause infection. Sinusitis can develop quickly and last only a short time (acute) or continue over a long period (chronic). Sinusitis that lasts for more than 12 weeks is considered chronic. CAUSES Causes of sinusitis include:  Allergies.  Structural abnormalities, such as displacement of the cartilage that separates your nostrils (deviated septum), which can decrease the air flow through your nose and sinuses and affect sinus drainage.  Functional abnormalities, such as when the small hairs (cilia) that line your sinuses and help remove mucus do not work properly or are not present. SIGNS AND SYMPTOMS Symptoms of acute and chronic sinusitis are the same. The primary symptoms are pain and pressure around the affected sinuses. Other symptoms include:  Upper toothache.  Earache.  Headache.  Bad breath.  Decreased sense of smell and taste.  A cough, which worsens when you are lying flat.  Fatigue.  Fever.  Thick drainage from your nose, which often is green and may contain pus (purulent).  Swelling and warmth over the affected sinuses. DIAGNOSIS Your health care provider will perform a physical exam. During your exam, your health care provider may perform any of the following to help determine if you have acute sinusitis or chronic sinusitis:  Look in your nose for signs of abnormal growths in your nostrils (nasal polyps).  Tap over the affected sinus to check for signs of infection.  View the inside of your sinuses using an imaging device that has a light attached (endoscope). If your health care provider suspects that you have chronic sinusitis, one or more of the  following tests may be recommended:  Allergy tests.  Nasal culture. A sample of mucus is taken from your nose, sent to a lab, and screened for bacteria.  Nasal cytology. A sample of mucus is taken from your nose and examined by your  health care provider to determine if your sinusitis is related to an allergy. TREATMENT Most cases of acute sinusitis are related to a viral infection and will resolve on their own within 10 days. Sometimes, medicines are prescribed to help relieve symptoms of both acute and chronic sinusitis. These may include pain medicines, decongestants, nasal steroid sprays, or saline sprays. However, for sinusitis related to a bacterial infection, your health care provider will prescribe antibiotic medicines. These are medicines that will help kill the bacteria causing the infection. Rarely, sinusitis is caused by a fungal infection. In these cases, your health care provider will prescribe antifungal medicine. For some cases of chronic sinusitis, surgery is needed. Generally, these are cases in which sinusitis recurs more than 3 times per year, despite other treatments. HOME CARE INSTRUCTIONS  Drink plenty of water. Water helps thin the mucus so your sinuses can drain more easily.  Use a humidifier.  Inhale steam 3-4 times a day (for example, sit in the bathroom with the shower running).  Apply a warm, moist washcloth to your face 3-4 times a day, or as directed by your health care provider.  Use saline nasal sprays to help moisten and clean your sinuses.  Take medicines only as directed by your health care provider.  If you were prescribed either an antibiotic or antifungal medicine, finish it all even if you start to feel better. SEEK IMMEDIATE MEDICAL CARE IF:  You have increasing pain or severe headaches.  You have nausea, vomiting, or drowsiness.  You have swelling around your face.  You have vision problems.  You have a stiff neck.  You have difficulty breathing.   This information is not intended to replace advice given to you by your health care provider. Make sure you discuss any questions you have with your health care provider.   Document Released: 03/26/2005 Document Revised:  04/16/2014 Document Reviewed: 04/10/2011 Elsevier Interactive Patient Education [employer].

## 2015-09-30 LAB — COMPREHENSIVE METABOLIC PANEL
ALK PHOS: 85 U/L (ref 33–115)
ALT: 16 U/L (ref 6–29)
AST: 17 U/L (ref 10–30)
Albumin: 4.5 g/dL (ref 3.6–5.1)
BUN: 14 mg/dL (ref 7–25)
CALCIUM: 9.8 mg/dL (ref 8.6–10.2)
CHLORIDE: 97 mmol/L — AB (ref 98–110)
CO2: 25 mmol/L (ref 20–31)
Creat: 0.86 mg/dL (ref 0.50–1.10)
Glucose, Bld: 182 mg/dL — ABNORMAL HIGH (ref 65–99)
POTASSIUM: 4.6 mmol/L (ref 3.5–5.3)
Sodium: 133 mmol/L — ABNORMAL LOW (ref 135–146)
TOTAL PROTEIN: 7.9 g/dL (ref 6.1–8.1)
Total Bilirubin: 0.5 mg/dL (ref 0.2–1.2)

## 2015-09-30 LAB — HIV ANTIBODY (ROUTINE TESTING W REFLEX): HIV: NONREACTIVE

## 2015-10-13 ENCOUNTER — Encounter: Payer: Self-pay | Admitting: Psychology

## 2015-10-24 ENCOUNTER — Encounter: Payer: Self-pay | Admitting: Family Medicine

## 2015-10-27 ENCOUNTER — Ambulatory Visit (INDEPENDENT_AMBULATORY_CARE_PROVIDER_SITE_OTHER): Payer: Medicare Other | Admitting: Family Medicine

## 2015-10-27 VITALS — BP 102/64 | HR 94 | Temp 98.9°F | Resp 16 | Ht 61.5 in | Wt 185.0 lb

## 2015-10-27 DIAGNOSIS — F419 Anxiety disorder, unspecified: Secondary | ICD-10-CM

## 2015-10-27 DIAGNOSIS — M545 Low back pain, unspecified: Secondary | ICD-10-CM

## 2015-10-27 DIAGNOSIS — K0889 Other specified disorders of teeth and supporting structures: Secondary | ICD-10-CM

## 2015-10-27 DIAGNOSIS — G8929 Other chronic pain: Secondary | ICD-10-CM

## 2015-10-27 DIAGNOSIS — F418 Other specified anxiety disorders: Secondary | ICD-10-CM | POA: Diagnosis not present

## 2015-10-27 DIAGNOSIS — Z720 Tobacco use: Secondary | ICD-10-CM | POA: Diagnosis not present

## 2015-10-27 DIAGNOSIS — M5489 Other dorsalgia: Secondary | ICD-10-CM | POA: Diagnosis not present

## 2015-10-27 DIAGNOSIS — K219 Gastro-esophageal reflux disease without esophagitis: Secondary | ICD-10-CM

## 2015-10-27 DIAGNOSIS — E119 Type 2 diabetes mellitus without complications: Secondary | ICD-10-CM | POA: Diagnosis not present

## 2015-10-27 DIAGNOSIS — F329 Major depressive disorder, single episode, unspecified: Secondary | ICD-10-CM

## 2015-10-27 LAB — GLUCOSE, POCT (MANUAL RESULT ENTRY): POC GLUCOSE: 203 mg/dL — AB (ref 70–99)

## 2015-10-27 LAB — POCT GLYCOSYLATED HEMOGLOBIN (HGB A1C): HEMOGLOBIN A1C: 11.1

## 2015-10-27 MED ORDER — OMEPRAZOLE 20 MG PO CPDR: 20.0000 mg | DELAYED_RELEASE_CAPSULE | Freq: Every day | ORAL | Status: DC

## 2015-10-27 MED ORDER — TIZANIDINE HCL 4 MG PO TABS: 4.0000 mg | ORAL_TABLET | Freq: Three times a day (TID) | ORAL | Status: DC | PRN

## 2015-10-27 MED ORDER — VARENICLINE TARTRATE 1 MG PO TABS: 1.0000 mg | ORAL_TABLET | Freq: Two times a day (BID) | ORAL | Status: DC

## 2015-10-27 MED ORDER — HYDROCODONE-ACETAMINOPHEN 7.5-325 MG PO TABS: 1.0000 | ORAL_TABLET | Freq: Two times a day (BID) | ORAL | Status: DC | PRN

## 2015-10-27 MED ORDER — VARENICLINE TARTRATE 0.5 MG X 11 & 1 MG X 42 PO MISC: ORAL | Status: DC

## 2015-10-27 MED ORDER — OMEPRAZOLE 40 MG PO CPDR: 40.0000 mg | DELAYED_RELEASE_CAPSULE | Freq: Every day | ORAL | Status: DC

## 2015-10-27 MED ORDER — ONETOUCH ULTRASOFT LANCETS MISC: Status: DC

## 2015-10-27 MED ORDER — GLUCOSE BLOOD VI STRP: ORAL_STRIP | Status: DC

## 2015-10-27 NOTE — Progress Notes (Signed)
Subjective:    Patient ID: Ann Lopez, female    DOB: March 26, 1971, 45 y.o.   MRN: [id number]  10/27/2015  Medication Refill (prilosec, hydrocodone, lancets)   HPI This 45 y.o. female presents for one month follow-up:  1. Decreased appetite, abdominal pain:  If eats, has pain.  No fever/chills/sweats.  +nausea; no vomiting.  No diarrhea; constipation chronic; having three attacks with stomach.  Taking prilosec daily; only daily.  Regurgitation last week.  Wt Readings from Last 3 Encounters:  10/27/15 185 lb (83.9 kg)  09/29/15 188 lb (85.3 kg)  08/30/15 184 lb (83.5 kg)   2.  L scalp region: getting sharp pains; duration 2 seconds; onset 2 weeks ago.  No neck pain.  No vision changes or headaches.    3.  Anxiety disorder: increased Cymbalta to 60m daily at last visit.  Clonazepam bid. Stresses about metastasis breast.  4.  DDD lumbar spine: taking hydrocodone bid.    Having higher back pain than chronic lower back pain; not sure etiology.  5. Asthma: albuterol as needed.  6.  DMII: Invokana 3061m   7.  Hyperlipidemia: fenofibrate  8.  L thoracic spine pain: onset two weeks ago; pain with movement; worried about metastatic breast cancer. Mother had mets to brain and lungs from breast.  Grandmother had mets to ribs.    Review of Systems  Constitutional: Negative for fever, chills, diaphoresis and fatigue.  Eyes: Negative for visual disturbance.  Respiratory: Negative for cough and shortness of breath.   Cardiovascular: Negative for chest pain, palpitations and leg swelling.  Gastrointestinal: Negative for nausea, vomiting, abdominal pain, diarrhea and constipation.  Endocrine: Negative for cold intolerance, heat intolerance, polydipsia, polyphagia and polyuria.  Musculoskeletal: Positive for arthralgias, back pain and myalgias. Negative for gait problem.  Neurological: Negative for dizziness, tremors, seizures, syncope, facial asymmetry, speech difficulty, weakness,  light-headedness, numbness and headaches.  Psychiatric/Behavioral: Negative for self-injury, sleep disturbance and suicidal ideas. The patient is nervous/anxious.     Past Medical History:  Diagnosis Date  . Allergy   . Anxiety   . Asthma   . Back pain   [place]  [place]  . Depression   . Diabetes mellitus   . Headache   . Hyperlipemia   . Osteopenia   . Osteoporosis    osteopenia   Past Surgical History:  Procedure Laterality Date  . ABDOMINAL HYSTERECTOMY     B oophorectomy for BRCA1 gene  . BREAST SURGERY    . CESAREAN SECTION    . INCISION AND DRAINAGE ABSCESS Left 11/14/2012   Procedure: INCISION AND DRAINAGE ABSCESS LEFT GROIN;  Surgeon: MaJamesetta SoMD;  Location: [facility];  Service: General;  Laterality: Left;  . LYMPHADENECTOMY    . MASTECTOMY Bilateral    Allergies  Allergen Reactions  . Azithromycin     Unknown  . Morphine And Related     Can tolerate hydrocodone and dilaudid without side effects  . Nitrofurantoin     Unknown  . Penicillins Hives and Itching  . Sulfonamide Derivatives Hives    Social History   Social History  . Marital status: Single    Spouse name: N/A  . Number of children: 4  . Years of education: 1356 Occupational History  . Disabled    Social History Main Topics  . Smoking status: Current Some Day Smoker    Types: Cigarettes  . Smokeless tobacco: Never Used  . Alcohol  use No  . Drug use: No  . Sexual activity: Not Currently   Other Topics Concern  . Not on file   Social History Narrative   Marital status: single      Lives: at home with her four sons.      Employment: disability for breast cancer, DDD lumbar, anxiety/panic attacks      Tobacco: 1 ppd      Alcohol: none         Right-handed.   2-3 cups caffeine daily.   Family History  Problem Relation Age of Onset  . Cancer Mother 54    breast cancer  . Hypertension Sister   . Hypothyroidism Brother   . Cancer Maternal Grandmother  63    breast cancer  . Cancer Maternal Grandfather 55    pancreatic cancer       Objective:    BP 102/64   Pulse 94   Temp 98.9 F (37.2 C)   Resp 16   Ht 5' 1.5" (1.562 m)   Wt 185 lb (83.9 kg)   SpO2 95%   BMI 34.39 kg/m   Physical Exam  Constitutional: She is oriented to person, place, and time. She appears well-developed and well-nourished. No distress.  HENT:  Head: Normocephalic and atraumatic.  Right Ear: External ear normal.  Left Ear: External ear normal.  Nose: Nose normal.  Mouth/Throat: Oropharynx is clear and moist.  Eyes: Conjunctivae and EOM are normal. Pupils are equal, round, and reactive to light.  Neck: Normal range of motion. Neck supple. Carotid bruit is not present. No thyromegaly present.  Cardiovascular: Normal rate, regular rhythm, normal heart sounds and intact distal pulses.  Exam reveals no gallop and no friction rub.   No murmur heard. Pulmonary/Chest: Effort normal and breath sounds normal. She has no wheezes. She has no rales.  Abdominal: Soft. Bowel sounds are normal. She exhibits no distension and no mass. There is no tenderness. There is no rebound and no guarding.  Musculoskeletal:       Right shoulder: Normal. She exhibits normal range of motion and no tenderness.       Left shoulder: Normal. She exhibits normal range of motion, no tenderness, no bony tenderness, no pain and no spasm.       Cervical back: Normal. She exhibits normal range of motion, no tenderness, no bony tenderness and no swelling.       Thoracic back: She exhibits pain. She exhibits normal range of motion, no tenderness, no bony tenderness and no spasm.       Lumbar back: She exhibits pain and spasm. She exhibits normal range of motion, no tenderness and no bony tenderness.  Lymphadenopathy:    She has no cervical adenopathy.  Neurological: She is alert and oriented to person, place, and time. No cranial nerve deficit.  Skin: Skin is warm and dry. No rash noted. She is  not diaphoretic. No erythema. No pallor.  Psychiatric: She has a normal mood and affect. Her behavior is normal.   Results for orders placed or performed in visit on 10/27/15  CBC with Differential/Platelet  Result Value Ref Range   WBC 11.4 (H) 3.8 - 10.8 K/uL   RBC 5.54 (H) 3.80 - 5.10 MIL/uL   Hemoglobin 16.2 (H) 11.7 - 15.5 g/dL   HCT 47.0 (H) 35.0 - 45.0 %   MCV 84.8 80.0 - 100.0 fL   MCH 29.2 27.0 - 33.0 pg   MCHC 34.5 32.0 - 36.0 g/dL  RDW 15.0 11.0 - 15.0 %   Platelets 246 140 - 400 K/uL   MPV 11.2 7.5 - 12.5 fL   Neutro Abs 7,752 1,500 - 7,800 cells/uL   Lymphs Abs 3,078 850 - 3,900 cells/uL   Monocytes Absolute 456 200 - 950 cells/uL   Eosinophils Absolute 114 15 - 500 cells/uL   Basophils Absolute 0 0 - 200 cells/uL   Neutrophils Relative % 68 %   Lymphocytes Relative 27 %   Monocytes Relative 4 %   Eosinophils Relative 1 %   Basophils Relative 0 %   Smear Review Criteria for review not met   Comprehensive metabolic panel  Result Value Ref Range   Sodium 136 135 - 146 mmol/L   Potassium 4.2 3.5 - 5.3 mmol/L   Chloride 100 98 - 110 mmol/L   CO2 24 20 - 31 mmol/L   Glucose, Bld 216 (H) 65 - 99 mg/dL   BUN 9 7 - 25 mg/dL   Creat 0.60 0.50 - 1.10 mg/dL   Total Bilirubin 0.6 0.2 - 1.2 mg/dL   Alkaline Phosphatase 81 33 - 115 U/L   AST 13 10 - 30 U/L   ALT 17 6 - 29 U/L   Total Protein 7.4 6.1 - 8.1 g/dL   Albumin 4.2 3.6 - 5.1 g/dL   Calcium 8.9 8.6 - 10.2 mg/dL  POCT glucose (manual entry)  Result Value Ref Range   POC Glucose 203 (A) 70 - 99 mg/dl  POCT glycosylated hemoglobin (Hb A1C)  Result Value Ref Range   Hemoglobin A1C 11.1        Assessment & Plan:   1. Type 2 diabetes mellitus without complication, without long-term current use of insulin ([place])   2. Chronic low back pain   3. Gastroesophageal reflux disease without esophagitis   4. Other back pain   5. Pain, dental   6. Anxiety and depression   7. Tobacco abuse     Orders Placed This  Encounter  Procedures  . CBC with Differential/Platelet  . Comprehensive metabolic panel  . POCT glucose (manual entry)  . POCT glycosylated hemoglobin (Hb A1C)  . POCT urinalysis dipstick  . POCT Microscopic Urinalysis ([facility])   Meds ordered this encounter  Medications  . DISCONTD: omeprazole (PRILOSEC) 20 MG capsule    Sig: Take 1 capsule (20 mg total) by mouth daily.    Dispense:  30 capsule    Refill:  11  . HYDROcodone-acetaminophen (NORCO) 7.5-325 MG tablet    Sig: Take 1 tablet by mouth 2 (two) times daily as needed for moderate pain.    Dispense:  60 tablet    Refill:  0  . omeprazole (PRILOSEC) 40 MG capsule    Sig: Take 1 capsule (40 mg total) by mouth daily.    Dispense:  30 capsule    Refill:  5  . varenicline (CHANTIX STARTING MONTH PAK) 0.5 MG X 11 & 1 MG X 42 tablet    Sig: Take one 0.5 mg tablet by mouth once daily for 3 days, then increase to one 0.5 mg tablet twice daily for 4 days, then increase to one 1 mg tablet twice daily.    Dispense:  53 tablet    Refill:  0  . varenicline (CHANTIX CONTINUING MONTH PAK) 1 MG tablet    Sig: Take 1 tablet (1 mg total) by mouth 2 (two) times daily.    Dispense:  60 tablet    Refill:  2  . tiZANidine (  ZANAFLEX) 4 MG tablet    Sig: Take 1 tablet (4 mg total) by mouth every 8 (eight) hours as needed for muscle spasms.    Dispense:  45 tablet    Refill:  0  . glucose blood (CHOICE DM FORA G20 TEST STRIPS) test strip    Sig: Use as instructed    Dispense:  100 each    Refill:  12  . Lancets (ONETOUCH ULTRASOFT) lancets    Sig: Use as instructed; check sugar once daily    Dispense:  100 each    Refill:  12    Return in about 4 weeks (around 11/24/2015) for recheck.    Ann Lopez, M.D. [facility] [address] [place], [place]  [address] [phone] phone [phone] fax

## 2015-10-27 NOTE — Patient Instructions (Signed)
     IF you received an x-ray today, you will receive an invoice from [facility]. Please contact [facility] at [phone] with questions or concerns regarding your invoice.   IF you received labwork today, you will receive an invoice from [facility]. Please contact [facility] at [phone] with questions or concerns regarding your invoice.   Our billing staff will not be able to assist you with questions regarding bills from these companies.  You will be contacted with the lab results as soon as they are available. The fastest way to get your results is to activate your My Chart account. Instructions are located on the last page of this paperwork. If you have not heard from us regarding the results in 2 weeks, please contact this office.      

## 2015-10-28 LAB — COMPREHENSIVE METABOLIC PANEL
ALT: 17 U/L (ref 6–29)
AST: 13 U/L (ref 10–30)
Albumin: 4.2 g/dL (ref 3.6–5.1)
Alkaline Phosphatase: 81 U/L (ref 33–115)
BILIRUBIN TOTAL: 0.6 mg/dL (ref 0.2–1.2)
BUN: 9 mg/dL (ref 7–25)
CHLORIDE: 100 mmol/L (ref 98–110)
CO2: 24 mmol/L (ref 20–31)
CREATININE: 0.6 mg/dL (ref 0.50–1.10)
Calcium: 8.9 mg/dL (ref 8.6–10.2)
GLUCOSE: 216 mg/dL — AB (ref 65–99)
Potassium: 4.2 mmol/L (ref 3.5–5.3)
SODIUM: 136 mmol/L (ref 135–146)
Total Protein: 7.4 g/dL (ref 6.1–8.1)

## 2015-10-28 LAB — CBC WITH DIFFERENTIAL/PLATELET
Basophils Absolute: 0 cells/uL (ref 0–200)
Basophils Relative: 0 %
EOS PCT: 1 %
Eosinophils Absolute: 114 cells/uL (ref 15–500)
HCT: 47 % — ABNORMAL HIGH (ref 35.0–45.0)
HEMOGLOBIN: 16.2 g/dL — AB (ref 11.7–15.5)
LYMPHS ABS: 3078 {cells}/uL (ref 850–3900)
LYMPHS PCT: 27 %
MCH: 29.2 pg (ref 27.0–33.0)
MCHC: 34.5 g/dL (ref 32.0–36.0)
MCV: 84.8 fL (ref 80.0–100.0)
MONOS PCT: 4 %
MPV: 11.2 fL (ref 7.5–12.5)
Monocytes Absolute: 456 cells/uL (ref 200–950)
NEUTROS PCT: 68 %
Neutro Abs: 7752 cells/uL (ref 1500–7800)
PLATELETS: 246 10*3/uL (ref 140–400)
RBC: 5.54 MIL/uL — AB (ref 3.80–5.10)
RDW: 15 % (ref 11.0–15.0)
WBC: 11.4 10*3/uL — AB (ref 3.8–10.8)

## 2015-11-28 ENCOUNTER — Telehealth: Payer: Self-pay

## 2015-11-28 DIAGNOSIS — M5489 Other dorsalgia: Secondary | ICD-10-CM

## 2015-11-28 DIAGNOSIS — K0889 Other specified disorders of teeth and supporting structures: Secondary | ICD-10-CM

## 2015-11-28 DIAGNOSIS — G8929 Other chronic pain: Secondary | ICD-10-CM

## 2015-11-28 DIAGNOSIS — M545 Low back pain: Secondary | ICD-10-CM

## 2015-11-28 NOTE — Telephone Encounter (Signed)
Pt is needing to get a refill on norco 7.5 mg and she still needs her lab results  [facility] [phone]

## 2015-11-29 NOTE — Telephone Encounter (Signed)
Lab letter was sent out on 7/17 for June labs, but I do not see any review of 7/20 labs yet. Dr Tamala Julian, please review and advise. Pended Rx for norco.

## 2015-11-30 NOTE — Telephone Encounter (Signed)
Patient stated she is in pain. She need her medication. I informed the patient her message is routed over to Dr. Tamala Julian and just waiting for a response.

## 2015-12-06 ENCOUNTER — Ambulatory Visit (INDEPENDENT_AMBULATORY_CARE_PROVIDER_SITE_OTHER): Payer: Medicare Other

## 2015-12-06 ENCOUNTER — Encounter: Payer: Self-pay | Admitting: Family Medicine

## 2015-12-06 ENCOUNTER — Ambulatory Visit (INDEPENDENT_AMBULATORY_CARE_PROVIDER_SITE_OTHER): Payer: Medicare Other | Admitting: Family Medicine

## 2015-12-06 VITALS — BP 110/66 | HR 87 | Temp 98.3°F | Resp 18 | Ht 61.5 in | Wt 185.0 lb

## 2015-12-06 DIAGNOSIS — Z23 Encounter for immunization: Secondary | ICD-10-CM | POA: Diagnosis not present

## 2015-12-06 DIAGNOSIS — K219 Gastro-esophageal reflux disease without esophagitis: Secondary | ICD-10-CM

## 2015-12-06 DIAGNOSIS — M5489 Other dorsalgia: Secondary | ICD-10-CM

## 2015-12-06 DIAGNOSIS — G8929 Other chronic pain: Secondary | ICD-10-CM

## 2015-12-06 DIAGNOSIS — K0889 Other specified disorders of teeth and supporting structures: Secondary | ICD-10-CM

## 2015-12-06 DIAGNOSIS — M545 Low back pain, unspecified: Secondary | ICD-10-CM

## 2015-12-06 DIAGNOSIS — R300 Dysuria: Secondary | ICD-10-CM | POA: Diagnosis not present

## 2015-12-06 DIAGNOSIS — Z1501 Genetic susceptibility to malignant neoplasm of breast: Secondary | ICD-10-CM

## 2015-12-06 DIAGNOSIS — E119 Type 2 diabetes mellitus without complications: Secondary | ICD-10-CM | POA: Diagnosis not present

## 2015-12-06 DIAGNOSIS — F341 Dysthymic disorder: Secondary | ICD-10-CM

## 2015-12-06 DIAGNOSIS — F32A Depression, unspecified: Secondary | ICD-10-CM

## 2015-12-06 DIAGNOSIS — F4010 Social phobia, unspecified: Secondary | ICD-10-CM | POA: Diagnosis not present

## 2015-12-06 DIAGNOSIS — Z9889 Other specified postprocedural states: Secondary | ICD-10-CM | POA: Diagnosis not present

## 2015-12-06 DIAGNOSIS — E785 Hyperlipidemia, unspecified: Secondary | ICD-10-CM | POA: Diagnosis not present

## 2015-12-06 DIAGNOSIS — Z9884 Bariatric surgery status: Secondary | ICD-10-CM

## 2015-12-06 DIAGNOSIS — M419 Scoliosis, unspecified: Secondary | ICD-10-CM | POA: Diagnosis not present

## 2015-12-06 DIAGNOSIS — F329 Major depressive disorder, single episode, unspecified: Secondary | ICD-10-CM | POA: Diagnosis not present

## 2015-12-06 DIAGNOSIS — Z15060 Genetic susceptibility to colorectal cancer: Secondary | ICD-10-CM

## 2015-12-06 DIAGNOSIS — Z1509 Genetic susceptibility to other malignant neoplasm: Secondary | ICD-10-CM

## 2015-12-06 LAB — POCT URINALYSIS DIP (MANUAL ENTRY)
BILIRUBIN UA: NEGATIVE
Bilirubin, UA: NEGATIVE
Glucose, UA: 500 — AB
Nitrite, UA: POSITIVE — AB
Nitrite, UA: POSITIVE — AB
PH UA: 5
PROTEIN UA: NEGATIVE
Protein Ur, POC: NEGATIVE
Spec Grav, UA: 1.015
Spec Grav, UA: 1.015
UROBILINOGEN UA: 0.2
Urobilinogen, UA: 0.2
pH, UA: 5

## 2015-12-06 LAB — POC MICROSCOPIC URINALYSIS (UMFC): Mucus: ABSENT

## 2015-12-06 LAB — GLUCOSE, POCT (MANUAL RESULT ENTRY): POC GLUCOSE: 316 mg/dL — AB (ref 70–99)

## 2015-12-06 MED ORDER — CIPROFLOXACIN HCL 500 MG PO TABS: 500.0000 mg | ORAL_TABLET | Freq: Two times a day (BID) | ORAL | 0 refills | Status: DC

## 2015-12-06 MED ORDER — HYDROCODONE-ACETAMINOPHEN 7.5-325 MG PO TABS: 1.0000 | ORAL_TABLET | Freq: Two times a day (BID) | ORAL | 0 refills | Status: DC | PRN

## 2015-12-06 MED ORDER — INSULIN GLARGINE 100 UNIT/ML SOLOSTAR PEN: 10.0000 [IU] | PEN_INJECTOR | Freq: Every day | SUBCUTANEOUS | 99 refills | Status: DC

## 2015-12-06 MED ORDER — DULOXETINE HCL 60 MG PO CPEP: 120.0000 mg | ORAL_CAPSULE | Freq: Every day | ORAL | 3 refills | Status: DC

## 2015-12-06 NOTE — Progress Notes (Signed)
By signing my name below, I, Mesha Guinyard, attest that this documentation has been prepared under the direction and in the presence of [employer]  Electronically Signed: Verlee Monte, Medical Scribe. 12/06/15. 11:40 AM.  Subjective:    Patient ID: Ann Lopez, female    DOB: [birth year], 45 y.o.   MRN: [id number]  12/06/2015  Medication Refill (All meds); Back Pain; and Dysuria  HPI  HPI Comments: Ann Lopez is a 45 y.o. female who presents to the [facility] complaining of pain with urination. Pt was referred to pain managaemnt 3/1 by Dr. Joseph Art. Pt was referred 3/20 for neurology and saw Dr Lenna Sciara from neuro 5/13 for memory issues. Her cognitive test was a 23 and she was referred to neuro for psych testing as well as checking on her b12 and thyroid. Pt wasn't suffering from anything organic, and her B12 was low and recommended a b12 supplement.  Pt would like to get a refill on all of her medications. Pt mentions she hasn't heard anything from the neurologist. Pt last had a MRI at [facility] from [place] in 2012  Back Pain: Pt mentions she's hurting every where, but mainly in her back. Pt has had lower back pain for 10 years but "it didn't start out that bad". Pt went to a chiropractor and ended up with a fracture rib. Pt went up when the provider pushed down and she had osteopenia. Pt states she fell a few days ago. Pt denies having radiating pain to her legs. Pt states the pain medication doesn't take the pain away but it allows her to move. Pt mentions she's had physical therapy for her back before.  Dysuria: Pt states it hurts when she urinates, and has residual vaginal pain even after she's done urinating. Pt noticed pain with urinating 3-4 days ago, but ti started out with dark urine. Pt reports associated symptoms of nausea, and strong odor to her urine. Pt doesn't drink enough water but started drinking more since seeing dark urine. Pt denies  fever, chills, and diaphoresis.  Cough: Pt reports hacking cough.  BM: Pt doesn't have a daily bm, but mentions she has always had constipation.  DM: Pt mentions her sugars. Pt mentions Metformin doesn't do anything for her. Pt fasting sugars is 160-170, but through the day is 230. Pt likes being on Novalog since it's fast acting for her. Pt has been on Lantus in the past. Pt mentions she was on 400 units after her Breast CA. Pt mentions she stopped Lantus since her sugars were dropping too low. Pt mentions she feels bad a lot. Lab Results  Component Value Date   HGBA1C 11.1 10/27/2015   Immunizations: Pt would like to get her PNA vaccine. Immunization History  Administered Date(s) Administered  . Influenza Whole 12/29/2009  . Influenza,inj,Quad PF,36+ Mos 12/06/2015  . Pneumococcal Conjugate-13 01/20/2015  . Td 06/07/2005   Anxiety: Pt mentions her panic attacks have tripled to a couple a day when she used to have a couple a week. Pt doesn't think Klonopin is helping her anxiety. Pt states she has a hard headed 45 y.o, and she buts heads with her children, (45 y/o, and 88 y/o) and she states a lot of things bothers her. Pt states after her breast CA she's been having worse panic attacks and she tired of going to the hospital for her symptoms of diaphoresis, arm pain, and others. Pt mentions just going out triggers her panic  attacks. Pt has tried counseling and it didn't help her panic attacks and depression. Pt drink about 1 drink a day. Pt denies thoughts of self harm, suicidal ideation, or homicidal ideation. Pt smokes, but she has just started chantix. Depression screen [facility] 2/9 12/06/2015 10/27/2015 09/29/2015 08/30/2015 07/28/2015  Decreased Interest 0 0 0 0 0  Down, Depressed, Hopeless 0 1 0 0 0  PHQ - 2 Score 0 1 0 0 0  Altered sleeping - - - - -  Tired, decreased energy - - - - -  Change in appetite - - - - -  Feeling bad or failure about yourself  - - - - -  Trouble concentrating - -  - - -  Moving slowly or fidgety/restless - - - - -  Suicidal thoughts - - - - -  PHQ-9 Score - - - - -  Difficult doing work/chores - - - - -   Review of Systems  Constitutional: Negative for chills, diaphoresis, fatigue and fever.  Eyes: Negative for visual disturbance.  Respiratory: Negative for cough and shortness of breath.   Cardiovascular: Negative for chest pain, palpitations and leg swelling.  Gastrointestinal: Positive for constipation. Negative for abdominal pain, diarrhea, nausea and vomiting.  Endocrine: Negative for cold intolerance, heat intolerance, polydipsia, polyphagia and polyuria.  Genitourinary: Positive for vaginal pain. Negative for dysuria.  Musculoskeletal: Positive for back pain.  Neurological: Negative for dizziness, tremors, seizures, syncope, facial asymmetry, speech difficulty, weakness, light-headedness, numbness and headaches.  Psychiatric/Behavioral: Negative for self-injury and suicidal ideas. The patient is nervous/anxious.    Past Medical History:  Diagnosis Date  . Allergy   . Anxiety   . Asthma   . Back pain   . Breast cancer ([place])   . Cancer ([place])   . Depression   . Diabetes mellitus   . Headache   . Hyperlipemia   . Osteopenia   . Osteoporosis    osteopenia   Past Surgical History:  Procedure Laterality Date  . ABDOMINAL HYSTERECTOMY     B oophorectomy for BRCA1 gene  . BREAST SURGERY    . CESAREAN SECTION    . INCISION AND DRAINAGE ABSCESS Left 11/14/2012   Procedure: INCISION AND DRAINAGE ABSCESS LEFT GROIN;  Surgeon: Jamesetta So, MD;  Location: [facility];  Service: General;  Laterality: Left;  . LYMPHADENECTOMY    . MASTECTOMY Bilateral    Allergies  Allergen Reactions  . Azithromycin     Unknown  . Morphine And Related     Can tolerate hydrocodone and dilaudid without side effects  . Nitrofurantoin     Unknown  . Penicillins Hives and Itching  . Sulfonamide Derivatives Hives    Social History   Social History  .  Marital status: Single    Spouse name: N/A  . Number of children: 4  . Years of education: 31   Occupational History  . Disabled    Social History Main Topics  . Smoking status: Current Some Day Smoker    Types: Cigarettes  . Smokeless tobacco: Never Used  . Alcohol use No  . Drug use: No  . Sexual activity: Not Currently   Other Topics Concern  . Not on file   Social History Narrative   Marital status: single      Lives: at home with her four sons (12, 23, 63, 68); no grandchildren      Employment: disability for breast cancer, DDD lumbar, anxiety/panic attacks      Tobacco:  1 ppd       Alcohol: none         Right-handed.   2-3 cups caffeine daily.   Family History  Problem Relation Age of Onset  . Cancer Mother 28    breast cancer  . Hypertension Sister   . Hypothyroidism Brother   . Cancer Maternal Grandmother 57    breast cancer  . Cancer Maternal Grandfather 55    pancreatic cancer       Objective:    BP 110/66 (BP Location: Right Arm, Patient Position: Sitting, Cuff Size: Small)   Pulse 87   Temp 98.3 F (36.8 C) (Oral)   Resp 18   Ht 5' 1.5" (1.562 m)   Wt 185 lb (83.9 kg)   SpO2 98%   BMI 34.39 kg/m  Physical Exam  Constitutional: She is oriented to person, place, and time. She appears well-developed and well-nourished. No distress.  HENT:  Head: Normocephalic and atraumatic.  Right Ear: External ear normal.  Left Ear: External ear normal.  Nose: Nose normal.  Mouth/Throat: Oropharynx is clear and moist.  Eyes: Conjunctivae and EOM are normal. Pupils are equal, round, and reactive to light.  Neck: Normal range of motion. Neck supple. Carotid bruit is not present. No thyromegaly present.  Cardiovascular: Normal rate, regular rhythm, normal heart sounds and intact distal pulses.  Exam reveals no gallop and no friction rub.   No murmur heard. Pulmonary/Chest: Effort normal and breath sounds normal. No respiratory distress. She has no wheezes. She  has no rales.  Abdominal: Soft. Bowel sounds are normal. She exhibits no distension and no mass. There is no tenderness. There is no rebound and no guarding.  Musculoskeletal:       Thoracic back: Normal. She exhibits normal range of motion, no tenderness, no bony tenderness, no pain and no spasm.       Lumbar back: She exhibits pain. She exhibits normal range of motion, no tenderness, no bony tenderness, no spasm and normal pulse.  Lymphadenopathy:    She has no cervical adenopathy.  Neurological: She is alert and oriented to person, place, and time. No cranial nerve deficit.  Skin: Skin is warm and dry. No rash noted. She is not diaphoretic. No erythema. No pallor.  Psychiatric: She has a normal mood and affect. Her behavior is normal.  Nursing note and vitals reviewed.  Results for orders placed or performed in visit on 12/06/15  POCT glucose (manual entry)  Result Value Ref Range   POC Glucose 316 (A) 70 - 99 mg/dl  POCT urinalysis dipstick  Result Value Ref Range   Color, UA yellow yellow   Clarity, UA cloudy (A) clear   Glucose, UA =500 (A) negative   Bilirubin, UA negative negative   Ketones, POC UA trace (5) (A) negative   Spec Grav, UA 1.015    Blood, UA trace-lysed (A) negative   pH, UA 5.0    Protein Ur, POC negative negative   Urobilinogen, UA 0.2    Nitrite, UA Positive (A) Negative   Leukocytes, UA Trace (A) Negative  POCT Microscopic Urinalysis ([facility])  Result Value Ref Range   WBC,UR,HPF,POC Too numerous to count  (A) None WBC/hpf   RBC,UR,HPF,POC Few (A) None RBC/hpf   Bacteria Many (A) None, Too numerous to count   Mucus Absent Absent   Epithelial Cells, UR Per Microscopy Few (A) None, Too numerous to count cells/hpf  POCT urinalysis dipstick  Result Value Ref Range   Color, UA  yellow yellow   Clarity, UA cloudy (A) clear   Glucose, UA =500 (A) negative   Bilirubin, UA negative negative   Ketones, POC UA trace (5) (A) negative   Spec Grav, UA 1.015     Blood, UA trace-lysed (A) negative   pH, UA 5.0    Protein Ur, POC negative negative   Urobilinogen, UA 0.2    Nitrite, UA Positive (A) Negative   Leukocytes, UA Trace (A) Negative   Dg Lumbar Spine Complete  Result Date: 12/06/2015 CLINICAL DATA:  Back pain. EXAM: LUMBAR SPINE - COMPLETE 4+ VIEW COMPARISON:  CT 01/24/2014 . FINDINGS: Mild scoliosis concave right. Diffuse mild degenerative change. No acute bony abnormality identified. Pelvic calcifications consistent with phleboliths. IMPRESSION: Mild scoliosis and diffuse degenerative change. No acute abnormality. Electronically Signed   By: Marcello Moores  Register   On: 12/06/2015 12:19    Assessment & Plan:   1. Type 2 diabetes mellitus without complication, without long-term current use of insulin ([place])   2. DEPRESSION/ANXIETY   3. BRCA1 positive   4. Dyslipidemia   5. H/O gastric bypass; 05/14/11([facility])   6. Gastroesophageal reflux disease without esophagitis   7. Chronic lower back pain   8. Dysuria   9. Encounter for immunization   10. Depression   11. Social anxiety disorder    -uncontrolled diabetes; rx for Lantus to initiate 10units daily and titrate up by 2 units every 72 hours if fasting sugars remain above 120.  Do not increase Lantus above 20 units daily. -patient reports chronic daily back pain; refer to ortho for further evaluation.  S/p lumbar spine films in office.  Counseled patient that we need to undergo further evaluation of chronic back pain; I would like to decrease daily narcotic use. -also advised patient of current guidelines that recommend avoiding benzo use with daily narcotic use; recommend pt wean Klonopin to off over the upcoming month; I will not continue to refill this medication; increase Cymbalta to 135m daily which should improve anxiety/depression and chronic pain. -obtain urine culture for dysuria symptoms.   Orders Placed This Encounter  Procedures  . Urine culture  . DG Lumbar Spine Complete     Standing Status:   Future    Standing Expiration Date:   12/05/2016    Order Specific Question:   Reason for Exam (SYMPTOM  OR DIAGNOSIS REQUIRED)    Answer:   chronic lower back pain    Order Specific Question:   Is the patient pregnant?    Answer:   No    Order Specific Question:   Preferred imaging location?    Answer:   External  . Flu Vaccine QUAD 36+ mos IM  . Pneumococcal polysaccharide vaccine 23-valent greater than or equal to 2yo subcutaneous/IM  . Comprehensive metabolic panel    Order Specific Question:   Has the patient fasted?    Answer:   Yes  . Lipid panel    Order Specific Question:   Has the patient fasted?    Answer:   Yes  . POCT glucose (manual entry)  . POCT urinalysis dipstick  . POCT Microscopic Urinalysis ([facility])  . POCT urinalysis dipstick   Meds ordered this encounter  Medications  . Insulin Glargine (LANTUS SOLOSTAR) 100 UNIT/ML Solostar Pen    Sig: Inject 10-20 Units into the skin daily at 10 pm.    Dispense:  5 pen    Refill:  PRN  . DULoxetine (CYMBALTA) 60 MG capsule    Sig: Take 2  capsules (120 mg total) by mouth daily.    Dispense:  180 capsule    Refill:  3    No Follow-up on file.  I personally performed the services described in this documentation, which was scribed in my presence. The recorded information has been reviewed and considered.  Aldean Suddeth Elayne Guerin, M.D. [facility] [address] [place], [place]  [address] [phone] phone [phone] fax

## 2015-12-06 NOTE — Patient Instructions (Signed)
     IF you received an x-ray today, you will receive an invoice from [facility]. Please contact [facility] at [phone] with questions or concerns regarding your invoice.   IF you received labwork today, you will receive an invoice from [facility]. Please contact [facility] at [phone] with questions or concerns regarding your invoice.   Our billing staff will not be able to assist you with questions regarding bills from these companies.  You will be contacted with the lab results as soon as they are available. The fastest way to get your results is to activate your My Chart account. Instructions are located on the last page of this paperwork. If you have not heard from us regarding the results in 2 weeks, please contact this office.      

## 2015-12-07 LAB — COMPREHENSIVE METABOLIC PANEL
ALBUMIN: 4.2 g/dL (ref 3.6–5.1)
ALK PHOS: 81 U/L (ref 33–115)
ALT: 20 U/L (ref 6–29)
AST: 13 U/L (ref 10–30)
BILIRUBIN TOTAL: 0.5 mg/dL (ref 0.2–1.2)
BUN: 8 mg/dL (ref 7–25)
CO2: 23 mmol/L (ref 20–31)
CREATININE: 0.57 mg/dL (ref 0.50–1.10)
Calcium: 9.1 mg/dL (ref 8.6–10.2)
Chloride: 100 mmol/L (ref 98–110)
Glucose, Bld: 327 mg/dL — ABNORMAL HIGH (ref 65–99)
Potassium: 4 mmol/L (ref 3.5–5.3)
SODIUM: 134 mmol/L — AB (ref 135–146)
TOTAL PROTEIN: 7.4 g/dL (ref 6.1–8.1)

## 2015-12-07 LAB — LIPID PANEL
CHOLESTEROL: 234 mg/dL — AB (ref 125–200)
HDL: 25 mg/dL — ABNORMAL LOW (ref >=46)
TRIGLYCERIDES: 1292 mg/dL — AB (ref <150)
Total CHOL/HDL Ratio: 9.4 Ratio — ABNORMAL HIGH (ref <=5.0)

## 2015-12-08 LAB — URINE CULTURE: Organism ID, Bacteria: 10000

## 2015-12-09 NOTE — Telephone Encounter (Signed)
Refill of hydrocodone provided at 12/06/15 visit.

## 2015-12-19 ENCOUNTER — Emergency Department (HOSPITAL_COMMUNITY): Payer: Medicare Other

## 2015-12-19 ENCOUNTER — Observation Stay (HOSPITAL_COMMUNITY)
Admission: EM | Admit: 2015-12-19 | Discharge: 2015-12-20 | Disposition: A | Discharge status: Home / Self Care | Payer: Medicare Other | Attending: Internal Medicine | Admitting: Internal Medicine

## 2015-12-19 ENCOUNTER — Observation Stay (HOSPITAL_COMMUNITY): Payer: Medicare Other

## 2015-12-19 ENCOUNTER — Encounter (HOSPITAL_COMMUNITY): Payer: Self-pay

## 2015-12-19 DIAGNOSIS — R51 Headache: Secondary | ICD-10-CM | POA: Diagnosis not present

## 2015-12-19 DIAGNOSIS — Z79899 Other long term (current) drug therapy: Secondary | ICD-10-CM | POA: Insufficient documentation

## 2015-12-19 DIAGNOSIS — I10 Essential (primary) hypertension: Secondary | ICD-10-CM | POA: Diagnosis not present

## 2015-12-19 DIAGNOSIS — R200 Anesthesia of skin: Secondary | ICD-10-CM | POA: Diagnosis not present

## 2015-12-19 DIAGNOSIS — K219 Gastro-esophageal reflux disease without esophagitis: Secondary | ICD-10-CM | POA: Diagnosis not present

## 2015-12-19 DIAGNOSIS — Z7984 Long term (current) use of oral hypoglycemic drugs: Secondary | ICD-10-CM | POA: Diagnosis not present

## 2015-12-19 DIAGNOSIS — F17210 Nicotine dependence, cigarettes, uncomplicated: Secondary | ICD-10-CM | POA: Diagnosis not present

## 2015-12-19 DIAGNOSIS — Z791 Long term (current) use of non-steroidal anti-inflammatories (NSAID): Secondary | ICD-10-CM | POA: Insufficient documentation

## 2015-12-19 DIAGNOSIS — E119 Type 2 diabetes mellitus without complications: Secondary | ICD-10-CM | POA: Diagnosis not present

## 2015-12-19 DIAGNOSIS — Z794 Long term (current) use of insulin: Secondary | ICD-10-CM

## 2015-12-19 DIAGNOSIS — R079 Chest pain, unspecified: Principal | ICD-10-CM | POA: Diagnosis present

## 2015-12-19 DIAGNOSIS — E118 Type 2 diabetes mellitus with unspecified complications: Secondary | ICD-10-CM

## 2015-12-19 DIAGNOSIS — J45909 Unspecified asthma, uncomplicated: Secondary | ICD-10-CM | POA: Insufficient documentation

## 2015-12-19 DIAGNOSIS — R4781 Slurred speech: Secondary | ICD-10-CM | POA: Diagnosis not present

## 2015-12-19 LAB — CBC
HEMATOCRIT: 44.7 % (ref 36.0–46.0)
HEMOGLOBIN: 15.3 g/dL — AB (ref 12.0–15.0)
MCH: 29.7 pg (ref 26.0–34.0)
MCHC: 34.2 g/dL (ref 30.0–36.0)
MCV: 86.8 fL (ref 78.0–100.0)
Platelets: 285 10*3/uL (ref 150–400)
RBC: 5.15 MIL/uL — ABNORMAL HIGH (ref 3.87–5.11)
RDW: 13.9 % (ref 11.5–15.5)
WBC: 15.3 10*3/uL — ABNORMAL HIGH (ref 4.0–10.5)

## 2015-12-19 LAB — CREATININE, SERUM
Creatinine, Ser: 0.57 mg/dL (ref 0.44–1.00)
GFR calc Af Amer: >60 mL/min (ref >60)
GFR calc non Af Amer: >60 mL/min (ref >60)

## 2015-12-19 LAB — CBC WITH DIFFERENTIAL/PLATELET
Basophils Absolute: 0.1 10*3/uL (ref 0.0–0.1)
Basophils Relative: 1 %
EOS ABS: 0.1 10*3/uL (ref 0.0–0.7)
EOS PCT: 1 %
HCT: 44.5 % (ref 36.0–46.0)
Hemoglobin: 15.3 g/dL — ABNORMAL HIGH (ref 12.0–15.0)
LYMPHS ABS: 2.8 10*3/uL (ref 0.7–4.0)
Lymphocytes Relative: 29 %
MCH: 29.5 pg (ref 26.0–34.0)
MCHC: 34.4 g/dL (ref 30.0–36.0)
MCV: 85.9 fL (ref 78.0–100.0)
MONO ABS: 0.7 10*3/uL (ref 0.1–1.0)
MONOS PCT: 7 %
NEUTROS PCT: 62 %
Neutro Abs: 6 10*3/uL (ref 1.7–7.7)
PLATELETS: 254 10*3/uL (ref 150–400)
RBC: 5.18 MIL/uL — ABNORMAL HIGH (ref 3.87–5.11)
RDW: 14 % (ref 11.5–15.5)
WBC: 9.7 10*3/uL (ref 4.0–10.5)

## 2015-12-19 LAB — BASIC METABOLIC PANEL
Anion gap: 9 (ref 5–15)
BUN: 6 mg/dL (ref 6–20)
CALCIUM: 9.3 mg/dL (ref 8.9–10.3)
CO2: 27 mmol/L (ref 22–32)
Chloride: 99 mmol/L — ABNORMAL LOW (ref 101–111)
Creatinine, Ser: 0.44 mg/dL (ref 0.44–1.00)
GFR calc Af Amer: >60 mL/min (ref >60)
GLUCOSE: 285 mg/dL — AB (ref 65–99)
Potassium: 3.7 mmol/L (ref 3.5–5.1)
Sodium: 135 mmol/L (ref 135–145)

## 2015-12-19 LAB — GLUCOSE, RANDOM: Glucose, Bld: 360 mg/dL — ABNORMAL HIGH (ref 65–99)

## 2015-12-19 LAB — TROPONIN I
Troponin I: <0.03 ng/mL (ref <0.03)
Troponin I: <0.03 ng/mL (ref <0.03)
Troponin I: <0.03 ng/mL (ref <0.03)

## 2015-12-19 LAB — GLUCOSE, CAPILLARY
GLUCOSE-CAPILLARY: 396 mg/dL — AB (ref 65–99)
GLUCOSE-CAPILLARY: 490 mg/dL — AB (ref 65–99)

## 2015-12-19 LAB — RAPID URINE DRUG SCREEN, HOSP PERFORMED
Amphetamines: NOT DETECTED
BARBITURATES: NOT DETECTED
Benzodiazepines: NOT DETECTED
Cocaine: NOT DETECTED
Opiates: NOT DETECTED
Tetrahydrocannabinol: NOT DETECTED

## 2015-12-19 LAB — TSH: TSH: 0.939 u[IU]/mL (ref 0.350–4.500)

## 2015-12-19 MED ORDER — ONDANSETRON HCL 4 MG/2ML IJ SOLN
4.0000 mg | Freq: Four times a day (QID) | INTRAMUSCULAR | Status: DC | PRN
  Administered 2015-12-20 (×2): 4 mg via INTRAVENOUS
  Filled 2015-12-19 (×2): qty 2

## 2015-12-19 MED ORDER — ASPIRIN EC 325 MG PO TBEC
325.0000 mg | DELAYED_RELEASE_TABLET | Freq: Every day | ORAL | Status: DC
  Administered 2015-12-19 – 2015-12-20 (×2): 325 mg via ORAL
  Filled 2015-12-19 (×2): qty 1

## 2015-12-19 MED ORDER — GI COCKTAIL ~~LOC~~: 30.0000 mL | Freq: Four times a day (QID) | ORAL | Status: DC | PRN

## 2015-12-19 MED ORDER — HYDROCODONE-ACETAMINOPHEN 7.5-325 MG PO TABS
1.0000 | ORAL_TABLET | Freq: Two times a day (BID) | ORAL | Status: DC | PRN
  Administered 2015-12-19 – 2015-12-20 (×2): 1 via ORAL
  Filled 2015-12-19 (×3): qty 1

## 2015-12-19 MED ORDER — PANTOPRAZOLE SODIUM 40 MG PO TBEC
40.0000 mg | DELAYED_RELEASE_TABLET | Freq: Every day | ORAL | Status: DC
  Administered 2015-12-19 – 2015-12-20 (×2): 40 mg via ORAL
  Filled 2015-12-19 (×2): qty 1

## 2015-12-19 MED ORDER — INSULIN ASPART 100 UNIT/ML ~~LOC~~ SOLN
0.0000 [IU] | Freq: Every day | SUBCUTANEOUS | Status: DC
  Administered 2015-12-20: 5 [IU] via SUBCUTANEOUS

## 2015-12-19 MED ORDER — TIZANIDINE HCL 4 MG PO TABS: 4.0000 mg | ORAL_TABLET | Freq: Three times a day (TID) | ORAL | Status: DC | PRN

## 2015-12-19 MED ORDER — DULOXETINE HCL 60 MG PO CPEP
60.0000 mg | ORAL_CAPSULE | Freq: Every day | ORAL | Status: DC
  Administered 2015-12-19 – 2015-12-20 (×2): 60 mg via ORAL
  Filled 2015-12-19 (×2): qty 1

## 2015-12-19 MED ORDER — ASPIRIN 81 MG PO CHEW
324.0000 mg | CHEWABLE_TABLET | Freq: Once | ORAL | Status: AC
  Administered 2015-12-19: 324 mg via ORAL
  Filled 2015-12-19: qty 4

## 2015-12-19 MED ORDER — ALBUTEROL SULFATE (2.5 MG/3ML) 0.083% IN NEBU
2.5000 mg | INHALATION_SOLUTION | Freq: Four times a day (QID) | RESPIRATORY_TRACT | Status: DC | PRN
Start: 2015-12-19 — End: 2015-12-20

## 2015-12-19 MED ORDER — INSULIN ASPART 100 UNIT/ML ~~LOC~~ SOLN
0.0000 [IU] | Freq: Three times a day (TID) | SUBCUTANEOUS | Status: DC
  Administered 2015-12-19: 15 [IU] via SUBCUTANEOUS
  Administered 2015-12-20: 8 [IU] via SUBCUTANEOUS

## 2015-12-19 MED ORDER — INSULIN GLARGINE 100 UNIT/ML ~~LOC~~ SOLN
10.0000 [IU] | Freq: Every day | SUBCUTANEOUS | Status: DC
  Administered 2015-12-19: 10 [IU] via SUBCUTANEOUS
  Filled 2015-12-19 (×2): qty 0.1

## 2015-12-19 MED ORDER — ACETAMINOPHEN 325 MG PO TABS
650.0000 mg | ORAL_TABLET | ORAL | Status: DC | PRN
  Administered 2015-12-19: 650 mg via ORAL
  Filled 2015-12-19: qty 2

## 2015-12-19 MED ORDER — FENOFIBRATE 160 MG PO TABS
160.0000 mg | ORAL_TABLET | Freq: Every day | ORAL | Status: DC
  Administered 2015-12-19 – 2015-12-20 (×2): 160 mg via ORAL
  Filled 2015-12-19 (×2): qty 1

## 2015-12-19 MED ORDER — IOPAMIDOL (ISOVUE-370) INJECTION 76%
100.0000 mL | Freq: Once | INTRAVENOUS | Status: AC | PRN
  Administered 2015-12-19: 100 mL via INTRAVENOUS

## 2015-12-19 MED ORDER — ENOXAPARIN SODIUM 40 MG/0.4ML ~~LOC~~ SOLN
40.0000 mg | SUBCUTANEOUS | Status: DC
  Administered 2015-12-19: 40 mg via SUBCUTANEOUS
  Filled 2015-12-19: qty 0.4

## 2015-12-19 NOTE — H&P (Signed)
History and Physical    Ann Lopez:[id number] DOB: [birth year] DOA: 12/19/2015  PCP: [facility]  Patient coming from: home  Chief Complaint: chest pain/left arm numbness  HPI: Ann Lopez is a 45 y.o. female with medical history significant of hypertension, diabetes, presents to the emergency room with chest pain/left arm numbness. Patient reports her symptoms started approximately 6 AM this morning. She was resting at that time. She describes initially experiencing pressure type sensation in her chest which lasted approximately 10 minutes. It was not associated with diaphoresis, shortness of breath. She did have nausea, but has been having that for over 2 weeks now. This was followed by development of numbness in her left shoulder. Numbness radiated down her left arm, across her left chest and up to her left jaw. She said that symptoms have been persistent since onset. She did not have any changes in vision, difficulty speech, no difficulty walking. She's not had any cough, fever, shortness of breath or any other complaints.  ED Course: She was evaluated in the emergency room where EKG was found to be unremarkable. Cardiac enzymes be negative. CT head did not show any acute findings. She was referred for admission.  Review of Systems: As per HPI otherwise 10 point review of systems negative.    Past Medical History:  Diagnosis Date  . Allergy   . Anxiety   . Asthma   . Back pain   . Breast cancer ([place])   . Cancer ([place])   . Depression   . Diabetes mellitus   . Headache   . Hyperlipemia   . Osteopenia   . Osteoporosis    osteopenia    Past Surgical History:  Procedure Laterality Date  . ABDOMINAL HYSTERECTOMY     B oophorectomy for BRCA1 gene  . BREAST SURGERY    . CESAREAN SECTION    . INCISION AND DRAINAGE ABSCESS Left 11/14/2012   Procedure: INCISION AND DRAINAGE ABSCESS LEFT GROIN;  Surgeon: Jamesetta So, MD;  Location: [facility];  Service:  General;  Laterality: Left;  . LYMPHADENECTOMY    . MASTECTOMY Bilateral      reports that she has been smoking Cigarettes.  She has been smoking about 0.50 packs per day. She has never used smokeless tobacco. She reports that she does not drink alcohol or use drugs.  Allergies  Allergen Reactions  . Azithromycin     Unknown  . Morphine And Related     Can tolerate hydrocodone and dilaudid without side effects  . Nitrofurantoin     Unknown  . Penicillins Hives and Itching    Has patient had a PCN reaction causing immediate rash, facial/tongue/throat swelling, SOB or lightheadedness with hypotension: Yes Has patient had a PCN reaction causing severe rash involving mucus membranes or skin necrosis: No Has patient had a PCN reaction that required hospitalization No Has patient had a PCN reaction occurring within the last 10 years: No If all of the above answers are "NO", then may proceed with Cephalosporin use.   . Sulfonamide Derivatives Hives    Family History  Problem Relation Age of Onset  . Cancer Mother 3    breast cancer  . Hypertension Sister   . Hypothyroidism Sister   . Cancer Sister   . Hypothyroidism Brother   . Cancer Maternal Grandmother 61    breast cancer  . Cancer Maternal Grandfather 68    pancreatic cancer     Prior to Admission  medications   Medication Sig Start Date End Date Taking? Authorizing Provider  albuterol (PROVENTIL HFA;VENTOLIN HFA) 108 (90 Base) MCG/ACT inhaler Inhale 1 puff into the lungs every 6 (six) hours as needed for wheezing or shortness of breath. 07/28/15  Yes Robyn Haber, MD  albuterol (PROVENTIL) (2.5 MG/3ML) 0.083% nebulizer solution Take 3 mLs (2.5 mg total) by nebulization every 6 (six) hours as needed for wheezing or shortness of breath. 4/40/10  Yes Delora Fuel, MD  canagliflozin [facility]) 300 MG TABS tablet Take 1 tablet (300 mg total) by mouth daily before breakfast. 06/28/15  Yes Robyn Haber, MD  Choline Fenofibrate  (FENOFIBRIC ACID) 135 MG CPDR TAKE ONE CAPSULE EVERY DAY 05/18/15  Yes Robyn Haber, MD  DULoxetine (CYMBALTA) 60 MG capsule Take 2 capsules (120 mg total) by mouth daily. Patient taking differently: Take 60 mg by mouth daily.  12/06/15  Yes Wardell Honour, MD  HYDROcodone-acetaminophen (NORCO) 7.5-325 MG tablet Take 1 tablet by mouth 2 (two) times daily as needed for moderate pain. 12/06/15  Yes Wardell Honour, MD  ibuprofen (ADVIL,MOTRIN) 200 MG tablet Take 800 mg by mouth every 6 (six) hours as needed for fever. Reported on 07/28/2015   Yes Historical Provider, MD  Insulin Glargine (LANTUS SOLOSTAR) 100 UNIT/ML Solostar Pen Inject 10-20 Units into the skin daily at 10 pm. 12/06/15  Yes Wardell Honour, MD  mometasone (NASONEX) 50 MCG/ACT nasal spray Place 2 sprays into the nose daily. 01/20/15  Yes Robyn Haber, MD  omeprazole (PRILOSEC) 40 MG capsule Take 1 capsule (40 mg total) by mouth daily. 10/27/15  Yes Wardell Honour, MD  tiZANidine (ZANAFLEX) 4 MG tablet Take 1 tablet (4 mg total) by mouth every 8 (eight) hours as needed for muscle spasms. 10/27/15  Yes Wardell Honour, MD  varenicline (CHANTIX CONTINUING MONTH PAK) 1 MG tablet Take 1 tablet (1 mg total) by mouth 2 (two) times daily. 10/27/15  Yes Wardell Honour, MD  glucose blood (CHOICE DM FORA G20 TEST STRIPS) test strip Use as instructed 10/27/15   Wardell Honour, MD  Lancets [facility] ULTRASOFT) lancets Use as instructed; check sugar once daily 10/27/15   Wardell Honour, MD    Physical Exam: Vitals:   12/19/15 1000 12/19/15 1100 12/19/15 1130 12/19/15 1345  BP: 99/70 123/89 124/83 100/64  Pulse: (!) 56 66 61 70  Resp: _0 Temp:    98 F (36.7 C)  TempSrc:    Oral  SpO2: 98% 96% 100% 99%  Weight:    82.9 kg (182 lb 11.2 oz)  Height:    _1  (1.575 m)      Constitutional: NAD, calm, comfortable Vitals:   12/19/15 1000 12/19/15 1100 12/19/15 1130 12/19/15 1345  BP: 99/70 123/89 124/83 100/64  Pulse: (!) 56 66 61 70    Resp: _2 Temp:    98 F (36.7 C)  TempSrc:    Oral  SpO2: 98% 96% 100% 99%  Weight:    82.9 kg (182 lb 11.2 oz)  Height:    _3  (1.575 m)   Eyes: PERRL, lids and conjunctivae normal ENMT: Mucous membranes are moist. Posterior pharynx clear of any exudate or lesions.Normal dentition.  Neck: normal, supple, no masses, no thyromegaly Respiratory: clear to auscultation bilaterally, no wheezing, no crackles. Normal respiratory effort. No accessory muscle use.  Cardiovascular: Regular rate and rhythm, no murmurs / rubs / gallops. No extremity edema. 2+ pedal pulses. No  carotid bruits.  Abdomen: no tenderness, no masses palpated. No hepatosplenomegaly. Bowel sounds positive.  Musculoskeletal: no clubbing / cyanosis. No joint deformity upper and lower extremities. Good ROM, no contractures. Normal muscle tone.  Skin: no rashes, lesions, ulcers. No induration Neurologic: CN 2-12 grossly intact. DTR normal. Strength 5/5 in all 4.  Psychiatric: Normal judgment and insight. Alert and oriented x 3. Normal mood.     Labs on Admission: I have personally reviewed following labs and imaging studies  CBC:  Recent Labs Lab 12/19/15 0755  WBC 9.7  NEUTROABS 6.0  HGB 15.3*  HCT 44.5  MCV 85.9  PLT 903   Basic Metabolic Panel:  Recent Labs Lab 12/19/15 0755  NA 135  K 3.7  CL 99*  CO2 27  GLUCOSE 285*  BUN 6  CREATININE 0.44  CALCIUM 9.3   GFR: Estimated Creatinine Clearance: 90.5 mL/min (by C-G formula based on SCr of 0.8 mg/dL). Liver Function Tests: No results for input(s): AST, ALT, ALKPHOS, BILITOT, PROT, ALBUMIN in the last 168 hours. No results for input(s): LIPASE, AMYLASE in the last 168 hours. No results for input(s): AMMONIA in the last 168 hours. Coagulation Profile: No results for input(s): INR, PROTIME in the last 168 hours. Cardiac Enzymes:  Recent Labs Lab 12/19/15 0755  TROPONINI <0.03   BNP (last 3 results) No results for input(s): PROBNP in  the last 8760 hours. HbA1C: No results for input(s): HGBA1C in the last 72 hours. CBG: No results for input(s): GLUCAP in the last 168 hours. Lipid Profile: No results for input(s): CHOL, HDL, LDLCALC, TRIG, CHOLHDL, LDLDIRECT in the last 72 hours. Thyroid Function Tests: No results for input(s): TSH, T4TOTAL, FREET4, T3FREE, THYROIDAB in the last 72 hours. Anemia Panel: No results for input(s): VITAMINB12, FOLATE, FERRITIN, TIBC, IRON, RETICCTPCT in the last 72 hours. Urine analysis:    Component Value Date/Time   COLORURINE YELLOW 06/17/2015 2202   APPEARANCEUR CLEAR 06/17/2015 2202   LABSPEC 1.028 06/17/2015 2202   PHURINE 5.5 06/17/2015 2202   GLUCOSEU >1000 (A) 06/17/2015 2202   HGBUR NEGATIVE 06/17/2015 2202   HGBUR negative 12/30/2006 1457   BILIRUBINUR negative 12/06/2015 1204   BILIRUBINUR neg 10/24/2014 1332   KETONESUR trace (5) (A) 12/06/2015 1204   KETONESUR NEGATIVE 06/17/2015 2202   PROTEINUR negative 12/06/2015 1204   PROTEINUR NEGATIVE 06/17/2015 2202   UROBILINOGEN 0.2 12/06/2015 1204   UROBILINOGEN 0.2 02/04/2015 1413   NITRITE Positive (A) 12/06/2015 1204   NITRITE NEGATIVE 06/17/2015 2202   LEUKOCYTESUR Trace (A) 12/06/2015 1204   Sepsis Labs: !!!!!!!!!!!!!!!!!!!!!!!!!!!!!!!!!!!!!!!!!!!! _0 (procalcitonin:4,lacticidven:4) )No results found for this or any previous visit (from the past 240 hour(s)).   Radiological Exams on Admission: Dg Chest 2 View  Result Date: 12/19/2015 CLINICAL DATA:  Left-sided chest pain EXAM: CHEST  2 VIEW COMPARISON:  06/17/2015 FINDINGS: The heart size and mediastinal contours are within normal limits. Both lungs are clear. Coarsened interstitial markings are noted bilaterally. The visualized skeletal structures are unremarkable. IMPRESSION: No active cardiopulmonary disease. Electronically Signed   By: Kerby Moors M.D.   On: 12/19/2015 08:22   Ct Head Wo Contrast  Result Date: 12/19/2015 CLINICAL DATA:  Numbness in  the left face, arm, and hand. EXAM: CT HEAD WITHOUT CONTRAST TECHNIQUE: Contiguous axial images were obtained from the base of the skull through the vertex without intravenous contrast. COMPARISON:  None. FINDINGS: Brain: No evidence of acute infarction, hemorrhage, hydrocephalus, extra-axial collection or mass lesion/mass effect. Subtle rounded density superior to the planum sphenoidale  is likely volume averaging when correlated with reformats. Presumed dilated perivascular space below the right putamen. Vascular: No hyperdense vessel or unexpected calcification. Skull: Normal. Negative for fracture or focal lesion. Sinuses/Orbits: Negative IMPRESSION: Negative.  No explanation for symptoms. Electronically Signed   By: Monte Fantasia M.D.   On: 12/19/2015 08:41    EKG: Independently reviewed. Sinus rhythm without acute changes  Assessment/Plan Active Problems:   Diabetes [facility])   Essential hypertension   GERD   Chest pain   Numbness     1. Chest pain. Etiology is unclear. Appears to be atypical. We will monitor on telemetry. Cycle cardiac markers. She does have risk factors for coronary disease. She reports having a stress test in 2013 that did not have any evidence of inducible ischemia. Will check echocardiogram. Continue on aspirin.  2. Left arm/chest/jaw numbness. CT scan of the head was unremarkable. Will check MRI brain. Check TSH and B-12.  3. GERD. Continue proton pump inhibitors  4. Hypertension. Stable. Continue outpatient regimen.   5. Diabetes. Hold oral agents. Start on sliding scale insulin and continue basal Lantus.   DVT prophylaxis: lovenox Code Status: full Family Communication: multiple children at the bedside Disposition Plan: discharge home once improved Consults called:  Admission status: observation   MEMON,JEHANZEB MD [facility] Pager [phone]  If 7PM-7AM, please contact night-coverage [email or web address] Password [email or web address]  12/19/2015, 4:31 PM

## 2015-12-19 NOTE — ED Provider Notes (Signed)
[facility] DEPT Provider Note   CSN: [id number] Arrival date & time: 12/19/15  1937     History   Chief Complaint Chief Complaint  Patient presents with  . Chest Pain    HPI Ann Lopez is a 45 y.o. female.  Patient presents waking up with central chest pain and pressure about 5 AM and lasted about 15 minutes.. She's had this before in the setting of panic attacks and this resulted in its own. About an hour later she developed numbness and tingling to her left jaw, left chest and left shoulder which has persisted. Denies any weakness in her arm. Denies difficulty speaking or swallowing. Denies any weakness or numbness in her legs. The chest pain has resolved. The chest pain did radiate to the left arm and left jaw. She reports no cardiac history. Last stress test in 2013. History of diabetes, hypertension, hyperlipidemia and remote breast cancer. She denies any head or neck pain. Denies Vision change. The pain is not worse with palpation or movement.   The history is provided by the patient.  Chest Pain   Associated symptoms include nausea and numbness. Pertinent negatives include no abdominal pain, no cough, no dizziness, no fever, no headaches, no shortness of breath, no vomiting and no weakness.    Past Medical History:  Diagnosis Date  . Allergy   . Anxiety   . Asthma   . Back pain   . Breast cancer ([place])   . Cancer ([place])   . Depression   . Diabetes mellitus   . Headache   . Hyperlipemia   . Osteopenia   . Osteoporosis    osteopenia    Patient Active Problem List   Diagnosis Date Noted  . Femoral hernia 03/24/2014  . Dyslipidemia 09/10/2011  . BRCA1 positive 09/10/2011  . Breast cancer ([place]) 09/10/2011  . H/O gastric bypass; 05/14/11([facility]) 09/10/2011  . HYPERTENSION 10/28/2008  . DEPRESSION/ANXIETY 02/19/2007  . CARPAL TUNNEL SYNDROME, RIGHT 02/19/2007  . ALLERGIC RHINITIS 02/19/2007  . GERD 02/19/2007  . IRRITABLE BOWEL SYNDROME 02/19/2007  . HERPES  GENITALIS 12/30/2006  . Diabetes ([place]) 12/30/2006  . OSTEOPENIA 12/30/2006  . ANOMALY, CONGENITAL, NERVOUS SYSTEM NEC 12/30/2006    Past Surgical History:  Procedure Laterality Date  . ABDOMINAL HYSTERECTOMY     B oophorectomy for BRCA1 gene  . BREAST SURGERY    . CESAREAN SECTION    . INCISION AND DRAINAGE ABSCESS Left 11/14/2012   Procedure: INCISION AND DRAINAGE ABSCESS LEFT GROIN;  Surgeon: Jamesetta So, MD;  Location: [facility];  Service: General;  Laterality: Left;  . LYMPHADENECTOMY    . MASTECTOMY Bilateral     OB History    No data available       Home Medications    Prior to Admission medications   Medication Sig Start Date End Date Taking? Authorizing Provider  albuterol (PROVENTIL HFA;VENTOLIN HFA) 108 (90 Base) MCG/ACT inhaler Inhale 1 puff into the lungs every 6 (six) hours as needed for wheezing or shortness of breath. 07/28/15   Robyn Haber, MD  albuterol (PROVENTIL) (2.5 MG/3ML) 0.083% nebulizer solution Take 3 mLs (2.5 mg total) by nebulization every 6 (six) hours as needed for wheezing or shortness of breath. 12/10/38   Delora Fuel, MD  canagliflozin [facility]) 300 MG TABS tablet Take 1 tablet (300 mg total) by mouth daily before breakfast. 06/28/15   Robyn Haber, MD  Choline Fenofibrate (FENOFIBRIC ACID) 135 MG CPDR TAKE ONE CAPSULE EVERY DAY 05/18/15   Synetta Shadow  Lauenstein, MD  ciprofloxacin (CIPRO) 500 MG tablet Take 1 tablet (500 mg total) by mouth 2 (two) times daily. 12/06/15   Wardell Honour, MD  clonazePAM (KLONOPIN) 1 MG tablet Take 1 tablet (1 mg total) by mouth 2 (two) times daily as needed for anxiety. 06/28/15   Robyn Haber, MD  DULoxetine (CYMBALTA) 60 MG capsule Take 2 capsules (120 mg total) by mouth daily. 12/06/15   Wardell Honour, MD  glucose blood (CHOICE DM FORA G20 TEST STRIPS) test strip Use as instructed 10/27/15   Wardell Honour, MD  HYDROcodone-acetaminophen (NORCO) 7.5-325 MG tablet Take 1 tablet by mouth 2 (two) times daily as needed for  moderate pain. 12/06/15   Wardell Honour, MD  ibuprofen (ADVIL,MOTRIN) 200 MG tablet Take 800 mg by mouth every 6 (six) hours as needed for fever. Reported on 07/28/2015    Historical Provider, MD  Insulin Glargine (LANTUS SOLOSTAR) 100 UNIT/ML Solostar Pen Inject 10-20 Units into the skin daily at 10 pm. 12/06/15   Wardell Honour, MD  Lancets [facility] ULTRASOFT) lancets Use as instructed; check sugar once daily 10/27/15   Wardell Honour, MD  mometasone (NASONEX) 50 MCG/ACT nasal spray Place 2 sprays into the nose daily. 01/20/15   Robyn Haber, MD  omeprazole (PRILOSEC) 40 MG capsule Take 1 capsule (40 mg total) by mouth daily. 10/27/15   Wardell Honour, MD  Prenatal Vit-Fe Fumarate-FA (PRENATAL VITAMINS) 28-0.8 MG TABS Take 1 tablet by mouth daily. 07/28/15   Robyn Haber, MD  terconazole (TERAZOL 7) 0.4 % vaginal cream Place 1 applicator vaginally at bedtime. 09/29/15   Wardell Honour, MD  tiZANidine (ZANAFLEX) 4 MG tablet Take 1 tablet (4 mg total) by mouth every 8 (eight) hours as needed for muscle spasms. 10/27/15   Wardell Honour, MD  varenicline (CHANTIX CONTINUING MONTH PAK) 1 MG tablet Take 1 tablet (1 mg total) by mouth 2 (two) times daily. 10/27/15   Wardell Honour, MD  varenicline (CHANTIX STARTING MONTH PAK) 0.5 MG X 11 & 1 MG X 42 tablet Take one 0.5 mg tablet by mouth once daily for 3 days, then increase to one 0.5 mg tablet twice daily for 4 days, then increase to one 1 mg tablet twice daily. 10/27/15   Wardell Honour, MD    Family History Family History  Problem Relation Age of Onset  . Cancer Mother 45    breast cancer  . Hypertension Sister   . Hypothyroidism Brother   . Cancer Maternal Grandmother 83    breast cancer  . Cancer Maternal Grandfather 32    pancreatic cancer    Social History Social History  Substance Use Topics  . Smoking status: Current Some Day Smoker    Types: Cigarettes  . Smokeless tobacco: Never Used  . Alcohol use No     Allergies     Azithromycin; Morphine and related; Nitrofurantoin; Penicillins; and Sulfonamide derivatives   Review of Systems Review of Systems  Constitutional: Negative for activity change, appetite change and fever.  HENT: Negative for congestion and rhinorrhea.   Eyes: Negative for visual disturbance.  Respiratory: Positive for chest tightness. Negative for cough and shortness of breath.   Cardiovascular: Positive for chest pain.  Gastrointestinal: Positive for nausea. Negative for abdominal pain and vomiting.  Genitourinary: Negative for dysuria and hematuria.  Musculoskeletal: Negative for arthralgias, neck pain and neck stiffness.  Neurological: Positive for numbness. Negative for dizziness, speech difficulty, weakness, light-headedness and headaches.  A complete 10 system review of systems was obtained and all systems are negative except as noted in the HPI and PMH.   Physical Exam Updated Vital Signs BP 132/84 (BP Location: Right Arm)   Pulse 62   Temp 98.4 F (36.9 C) (Oral)   Resp 18   Ht 5' 2"  (1.575 m)   Wt 185 lb (83.9 kg)   SpO2 98%   BMI 33.84 kg/m   Physical Exam  Constitutional: She is oriented to person, place, and time. She appears well-developed and well-nourished. No distress.  HENT:  Head: Normocephalic and atraumatic.  Mouth/Throat: Oropharynx is clear and moist. No oropharyngeal exudate.  Eyes: Conjunctivae and EOM are normal. Pupils are equal, round, and reactive to light.  Neck: Normal range of motion. Neck supple.  No meningismus.  Cardiovascular: Normal rate, regular rhythm, normal heart sounds and intact distal pulses.   No murmur heard. Pulmonary/Chest: Effort normal and breath sounds normal. No respiratory distress. She exhibits no tenderness.  Abdominal: Soft. There is no tenderness. There is no rebound and no guarding.  Musculoskeletal: Normal range of motion. She exhibits no edema or tenderness.  Intact radial pulses bilaterally. Intact Grip  strengths. There is subjective decreased sensation to the left jaw, left neck, left chest and shoulder to about mid humerus.  Neurological: She is alert and oriented to person, place, and time. No cranial nerve deficit. She exhibits normal muscle tone. Coordination normal.  No ataxia on finger to nose bilaterally. No pronator drift. 5/5 strength throughout. CN 2-12 intact.Equal grip strength. Sensation intact.   Skin: Skin is warm.  Psychiatric: She has a normal mood and affect. Her behavior is normal.  Nursing note and vitals reviewed.    ED Treatments / Results  Labs (all labs ordered are listed, but only abnormal results are displayed) Labs Reviewed  CBC WITH DIFFERENTIAL/PLATELET - Abnormal; Notable for the following:       Result Value   RBC 5.18 (*)    Hemoglobin 15.3 (*)    All other components within normal limits  BASIC METABOLIC PANEL - Abnormal; Notable for the following:    Chloride 99 (*)    Glucose, Bld 285 (*)    All other components within normal limits  CBC - Abnormal; Notable for the following:    WBC 15.3 (*)    RBC 5.15 (*)    Hemoglobin 15.3 (*)    All other components within normal limits  GLUCOSE, CAPILLARY - Abnormal; Notable for the following:    Glucose-Capillary 396 (*)    All other components within normal limits  TROPONIN I  URINE RAPID DRUG SCREEN, HOSP PERFORMED  TROPONIN I  CREATININE, SERUM  TROPONIN I  TROPONIN I  TSH  VITAMIN B12    EKG  EKG Interpretation  Date/Time:  Monday December 19 2015 07:43:09 EDT Ventricular Rate:  64 PR Interval:    QRS Duration: 94 QT Interval:  400 QTC Calculation: 413 R Axis:   78 Text Interpretation:  Sinus rhythm Anteroseptal infarct, old No significant change was found Confirmed by Wyvonnia Dusky  MD, Gerad Cornelio [phone]) on 12/19/2015 7:56:22 AM       Radiology Dg Chest 2 View  Result Date: 12/19/2015 CLINICAL DATA:  Left-sided chest pain EXAM: CHEST  2 VIEW COMPARISON:  06/17/2015 FINDINGS: The heart  size and mediastinal contours are within normal limits. Both lungs are clear. Coarsened interstitial markings are noted bilaterally. The visualized skeletal structures are unremarkable. IMPRESSION: No active cardiopulmonary disease. Electronically Signed  By: Kerby Moors M.D.   On: 12/19/2015 08:22   Ct Head Wo Contrast  Result Date: 12/19/2015 CLINICAL DATA:  Numbness in the left face, arm, and hand. EXAM: CT HEAD WITHOUT CONTRAST TECHNIQUE: Contiguous axial images were obtained from the base of the skull through the vertex without intravenous contrast. COMPARISON:  None. FINDINGS: Brain: No evidence of acute infarction, hemorrhage, hydrocephalus, extra-axial collection or mass lesion/mass effect. Subtle rounded density superior to the planum sphenoidale is likely volume averaging when correlated with reformats. Presumed dilated perivascular space below the right putamen. Vascular: No hyperdense vessel or unexpected calcification. Skull: Normal. Negative for fracture or focal lesion. Sinuses/Orbits: Negative IMPRESSION: Negative.  No explanation for symptoms. Electronically Signed   By: Monte Fantasia M.D.   On: 12/19/2015 08:41   Mr Brain Wo Contrast  Result Date: 12/19/2015 CLINICAL DATA:  LEFT-sided weakness and speech difficulty for one-day. History of breast cancer, diabetes, hypertension. EXAM: MRI HEAD WITHOUT CONTRAST TECHNIQUE: Multiplanar, multiecho pulse sequences of the brain and surrounding structures were obtained without intravenous contrast. COMPARISON:  CT HEAD December 19, 2015 at 0827 hours FINDINGS: Multiple sequences are mildly motion degraded. Fast sequences were use. BRAIN: No reduced diffusion to suggest acute ischemia. No susceptibility artifact to suggest hemorrhage. The ventricles and sulci are normal for patient's age. No suspicious parenchymal signal, masses or mass effect. No abnormal extra-axial fluid collections. No extra-axial masses though, contrast enhanced sequences  would be more sensitive. VASCULAR: Normal major intracranial vascular flow voids present at skull base. SKULL AND UPPER CERVICAL SPINE: No abnormal sellar expansion. No suspicious calvarial bone marrow signal. Craniocervical junction maintained. SINUSES/ORBITS: The included ocular globes and orbital contents are non-suspicious. Trace ethmoid mucosal thickening. Mastoid air cells are well aerated. OTHER: None. IMPRESSION: Negative mildly motion degraded MRI head without contrast. Electronically Signed   By: Elon Alas M.D.   On: 12/19/2015 17:28   Ct Angio Chest/abd/pel For Dissection W And/or Wo Contrast  Result Date: 12/19/2015 CLINICAL DATA:  Woke up this morning with chest pain; then her LT shoulder went numb; now c/o numbness to LT side of face, LT arm and LT hand.; diabetes EXAM: CT ANGIOGRAPHY CHEST, ABDOMEN AND PELVIS TECHNIQUE: Multidetector CT imaging through the chest, abdomen and pelvis was performed using the standard protocol during bolus administration of intravenous contrast. Multiplanar reconstructed images and MIPs were obtained and reviewed to evaluate the vascular anatomy. CONTRAST:  100 mL Isovue COMPARISON:  None. FINDINGS: CTA CHEST FINDINGS Cardiovascular: No contour abnormality of the descending, transverse or descending aorta to suggest dissection or aneurysm. Great vessels are normal. No pericardial fluid. No mediastinal hematoma. No central pulmonary embolism. Mediastinum/Nodes: No axillary supraclavicular adenopathy. No mediastinal hilar adenopathy. Lungs/Pleura: No suspicious pulmonary nodules. No airspace disease or pneumothorax. No pleural fluid. No infarction. Musculoskeletal: No acute osseous findings Review of the MIP images confirms the above findings. CTA ABDOMEN AND PELVIS FINDINGS Hepatobiliary: No focal hepatic lesion. No biliary duct dilatation. Gallbladder is normal. Common bile duct is normal. Pancreas: Pancreas is normal. No ductal dilatation. No pancreatic  inflammation. Spleen: Normal spleen Adrenals/urinary tract: Adrenal glands and kidneys are normal. The ureters and bladder normal. Stomach/Bowel: Stomach, small bowel, appendix, and cecum are normal. The colon and rectosigmoid colon are normal. Vascular/Lymphatic: Abdominal aorta is normal caliber. No aneurysm or dissection present. The iliac arteries are normal. The branches of the abdominal aorta are normal including 2 LEFT renal arteries. Reproductive: Post hysterectomy anatomy.  No adnexal abnormality. Other: There is a LEFT inguinal  hernia which contains a approximately 12 cm segment of nonobstructed small bowel. Musculoskeletal: No aggressive osseous lesion. Review of the MIP images confirms the above findings. IMPRESSION: Chest Impression: 1. No evidence of aortic dissection or aneurysm. 2. No acute pulmonary parenchymal findings. Abdomen / Pelvis Impression: 1. No evidence of abdominal aortic aneurysm or dissection. 2. LEFT inguinal hernia contains a 12 cm segment of nonobstructed small bowel. Electronically Signed   By: Suzy Bouchard M.D.   On: 12/19/2015 18:01    Procedures Procedures (including critical care time)  Medications Ordered in ED Medications  aspirin chewable tablet 324 mg (324 mg Oral Given 12/19/15 0756)     Initial Impression / Assessment and Plan / ED Course  I have reviewed the triage vital signs and the nursing notes.  Pertinent labs & imaging results that were available during my care of the patient were reviewed by me and considered in my medical decision making (see chart for details).  Clinical Course   Episode of chest pain which is since resolved now with numbness and tingling in left jaw and shoulder. Equal grip strengths. EKG normal sinus rhythm, septal Q waves are unchanged  Troponin negative. CXR negative.  CT head obtained given patient's left shoulder and jaw numbness. This is negative. Low suspicion for TIA or CVA as this appears to be atypical  distribution. More concern for possible ACS. Patient's heart score is 4.  Code stroke not activated due to minimal deficits. CTA negative for aortic dissection.  Numbness to L jaw and shoulder persists. Chest pain has resolved. Admission d/w Dr. Roderic Palau.  Final Clinical Impressions(s) / ED Diagnoses   Final diagnoses:  Nonspecific chest pain  Numbness    New Prescriptions New Prescriptions   No medications on file     Ezequiel Essex, MD 12/19/15 1825

## 2015-12-19 NOTE — Progress Notes (Signed)
During admission assessment, PT c/o numbness to left arm, hand, neck and left of chest.

## 2015-12-19 NOTE — ED Triage Notes (Signed)
Pt reports nausea x 1 week and this morning woke up at 5 am with chest pain.  Pt says she also started having pain and numbness in left chest, arm, and jaw.

## 2015-12-19 NOTE — Progress Notes (Signed)
Pt's CBG at 2053 was 490. Stat lab verification was ordered. [facility] was notified. Awaiting results and order.

## 2015-12-20 ENCOUNTER — Observation Stay (HOSPITAL_BASED_OUTPATIENT_CLINIC_OR_DEPARTMENT_OTHER): Payer: Medicare Other

## 2015-12-20 DIAGNOSIS — I10 Essential (primary) hypertension: Secondary | ICD-10-CM | POA: Diagnosis not present

## 2015-12-20 DIAGNOSIS — K219 Gastro-esophageal reflux disease without esophagitis: Secondary | ICD-10-CM | POA: Diagnosis not present

## 2015-12-20 DIAGNOSIS — R079 Chest pain, unspecified: Secondary | ICD-10-CM

## 2015-12-20 DIAGNOSIS — E119 Type 2 diabetes mellitus without complications: Secondary | ICD-10-CM | POA: Diagnosis not present

## 2015-12-20 LAB — ECHOCARDIOGRAM COMPLETE
E decel time: 239 msec
E/e' ratio: 14.36
FS: 38 % (ref 28–44)
Height: 62 in
IV/PV OW: 0.75
LA ID, A-P, ES: 35 mm
LA vol A4C: 44.7 ml
LADIAMINDEX: 1.9 cm/m2
LDCA: 3.14 cm2
LEFT ATRIUM END SYS DIAM: 35 mm
LV TDI E'MEDIAL: 6.53
LV e' LATERAL: 6.42 cm/s
LVEEAVG: 14.36
LVEEMED: 14.36
LVOTD: 20 mm
MV Dec: 239
MV Peak grad: 3 mmHg
MV pk A vel: 66 m/s
MV pk E vel: 92.2 m/s
PW: 12.1 mm — AB (ref 0.6–1.1)
RV TAPSE: 20 mm
TDI e' lateral: 6.42
WEIGHTICAEL: 2923.2 [oz_av]

## 2015-12-20 LAB — GLUCOSE, CAPILLARY
GLUCOSE-CAPILLARY: 329 mg/dL — AB (ref 65–99)
Glucose-Capillary: 262 mg/dL — ABNORMAL HIGH (ref 65–99)

## 2015-12-20 LAB — VITAMIN B12: VITAMIN B 12: 253 pg/mL (ref 180–914)

## 2015-12-20 MED ORDER — INSULIN ASPART 100 UNIT/ML ~~LOC~~ SOLN: 0.0000 [IU] | Freq: Every day | SUBCUTANEOUS | Status: DC

## 2015-12-20 MED ORDER — INSULIN GLARGINE 100 UNIT/ML ~~LOC~~ SOLN
20.0000 [IU] | Freq: Every day | SUBCUTANEOUS | Status: DC
  Filled 2015-12-20: qty 0.2

## 2015-12-20 MED ORDER — INSULIN ASPART 100 UNIT/ML ~~LOC~~ SOLN
0.0000 [IU] | Freq: Three times a day (TID) | SUBCUTANEOUS | Status: DC
  Administered 2015-12-20: 15 [IU] via SUBCUTANEOUS

## 2015-12-20 MED ORDER — ASPIRIN EC 81 MG PO TBEC: 81.0000 mg | DELAYED_RELEASE_TABLET | Freq: Every day | ORAL | 0 refills | Status: AC

## 2015-12-20 MED ORDER — INSULIN GLARGINE 100 UNIT/ML ~~LOC~~ SOLN
10.0000 [IU] | SUBCUTANEOUS | Status: AC
  Administered 2015-12-20: 10 [IU] via SUBCUTANEOUS
  Filled 2015-12-20: qty 0.1

## 2015-12-20 MED ORDER — INSULIN GLARGINE 100 UNIT/ML SOLOSTAR PEN: 20.0000 [IU] | PEN_INJECTOR | Freq: Every day | SUBCUTANEOUS | 99 refills | Status: DC

## 2015-12-20 NOTE — Progress Notes (Signed)
Inpatient Diabetes Program Recommendations  AACE/ADA: New Consensus Statement on Inpatient Glycemic Control (2015)  Target Ranges:  Prepandial:   less than 140 mg/dL      Peak postprandial:   less than 180 mg/dL (1-2 hours)      Critically ill patients:  140 - 180 mg/dL   Lab Results  Component Value Date   GLUCAP 262 (H) 12/20/2015   HGBA1C 11.1 10/27/2015    Review of Glycemic Control  Results for Ann Lopez, Ann Lopez (MRN [id number]) as of 12/20/2015 10:16  Ref. Range 12/19/2015 16:39 12/19/2015 20:53 12/20/2015 07:43  Glucose-Capillary Latest Ref Range: 65 - 99 mg/dL 396 (H) 490 (H) 262 (H)   Diabetes history: Type 2 Outpatient Diabetes medications: Lantus 10-20 units qhs, Invokana 300mg  qam  Current orders for Inpatient glycemic control: Lantus 10 units qhs, Novolog 0-15 units tid, Novolog 0-5 units qhs  Inpatient Diabetes Program Recommendations:  Fasting blood sugar remains elevated- consider increasing Lantus to 15 units qhs. (A1C 11.1%)  Gentry Fitz, RN, BA, [place], CDE Diabetes Coordinator Inpatient Diabetes Program  [phone] (Team Pager) [phone] ([place]) 12/20/2015 10:23 AM

## 2015-12-20 NOTE — Progress Notes (Signed)
Patient with orders to be discharge home. Discharge instructions given, patient verbalized understanding. Patient stable. Patient left in private vehicle with family.  

## 2015-12-20 NOTE — Progress Notes (Signed)
*  PRELIMINARY RESULTS* Echocardiogram 2D Echocardiogram has been performed.  Leavy Cella 12/20/2015, 2:07 PM

## 2015-12-20 NOTE — Discharge Summary (Signed)
Physician Discharge Summary  Ann Lopez [id number] DOB: [birth year] DOA: 12/19/2015  PCP: [facility] date: 12/19/2015 Discharge date: 12/20/2015  Admitted From: home Disposition:  home  Recommendations for Outpatient Follow-up:  1. Follow up with PCP in 1-2 weeks 2. Please obtain BMP/CBC in one week 3. Please follow up on the following pending results:  Home Health:  Equipment/Devices:  Discharge Condition: stable CODE STATUS: full code Diet recommendation: Heart Healthy  Brief/Interim Summary: 45 y/o female with history of Diabetes, hypertension, hyperlipidemia, presents to the emergency room with complaints of substernal chest pressure. She subsequently developed left chest/left arm/left neck numbness. Her chest pressure lasted approximately 10 minutes. Resolved spontaneously. Left arm numbness was present through the majority of the day of her admission. The following day, she reports that her numbness had improved. She did not have any further chest pressure. Workup in the hospital was unremarkable. EKG did not show any acute findings. Cardiac enzymes were negative, arguing against ACS. Echocardiogram was found to be unremarkable. She did have MRI of the brain that did not reveal any underlying. She is feeling significantly better and requests discharge home. She'll be set up with cardiology as an outpatient for further evaluation of chest pain. Remainder of medical issues remained stable.  Discharge Diagnoses:  Active Problems:   Diabetes [facility])   Essential hypertension   GERD   Chest pain   Numbness    Discharge Instructions  Discharge Instructions    Diet - low sodium heart healthy    Complete by:  As directed   Diet Carb Modified    Complete by:  As directed   Increase activity slowly    Complete by:  As directed       Medication List    TAKE these medications   albuterol (2.5 MG/3ML) 0.083% nebulizer solution Commonly known as:   PROVENTIL Take 3 mLs (2.5 mg total) by nebulization every 6 (six) hours as needed for wheezing or shortness of breath.   albuterol 108 (90 Base) MCG/ACT inhaler Commonly known as:  PROVENTIL HFA;VENTOLIN HFA Inhale 1 puff into the lungs every 6 (six) hours as needed for wheezing or shortness of breath.   aspirin EC 81 MG tablet Take 1 tablet (81 mg total) by mouth daily.   canagliflozin 300 MG Tabs tablet Commonly known as:  INVOKANA Take 1 tablet (300 mg total) by mouth daily before breakfast.   DULoxetine 60 MG capsule Commonly known as:  CYMBALTA Take 2 capsules (120 mg total) by mouth daily. What changed:  how much to take   Fenofibric Acid 135 MG Cpdr TAKE ONE CAPSULE EVERY DAY   glucose blood test strip Commonly known as:  CHOICE DM FORA G20 TEST STRIPS Use as instructed   HYDROcodone-acetaminophen 7.5-325 MG tablet Commonly known as:  NORCO Take 1 tablet by mouth 2 (two) times daily as needed for moderate pain.   ibuprofen 200 MG tablet Commonly known as:  ADVIL,MOTRIN Take 800 mg by mouth every 6 (six) hours as needed for fever. Reported on 07/28/2015   Insulin Glargine 100 UNIT/ML Solostar Pen Commonly known as:  LANTUS SOLOSTAR Inject 20 Units into the skin daily at 10 pm. What changed:  how much to take   mometasone 50 MCG/ACT nasal spray Commonly known as:  NASONEX Place 2 sprays into the nose daily.   omeprazole 40 MG capsule Commonly known as:  PRILOSEC Take 1 capsule (40 mg total) by mouth daily.   onetouch  ultrasoft lancets Use as instructed; check sugar once daily   tiZANidine 4 MG tablet Commonly known as:  ZANAFLEX Take 1 tablet (4 mg total) by mouth every 8 (eight) hours as needed for muscle spasms.   varenicline 1 MG tablet Commonly known as:  CHANTIX CONTINUING MONTH PAK Take 1 tablet (1 mg total) by mouth 2 (two) times daily.       Allergies  Allergen Reactions  . Azithromycin     Unknown  . Morphine And Related     Can tolerate  hydrocodone and dilaudid without side effects  . Nitrofurantoin     Unknown  . Penicillins Hives and Itching    Has patient had a PCN reaction causing immediate rash, facial/tongue/throat swelling, SOB or lightheadedness with hypotension: Yes Has patient had a PCN reaction causing severe rash involving mucus membranes or skin necrosis: No Has patient had a PCN reaction that required hospitalization No Has patient had a PCN reaction occurring within the last 10 years: No If all of the above answers are "NO", then may proceed with Cephalosporin use.   . Sulfonamide Derivatives Hives    Consultations:     Procedures/Studies: Dg Chest 2 View  Result Date: 12/19/2015 CLINICAL DATA:  Left-sided chest pain EXAM: CHEST  2 VIEW COMPARISON:  06/17/2015 FINDINGS: The heart size and mediastinal contours are within normal limits. Both lungs are clear. Coarsened interstitial markings are noted bilaterally. The visualized skeletal structures are unremarkable. IMPRESSION: No active cardiopulmonary disease. Electronically Signed   By: Kerby Moors M.D.   On: 12/19/2015 08:22   Dg Lumbar Spine Complete  Result Date: 12/06/2015 CLINICAL DATA:  Back pain. EXAM: LUMBAR SPINE - COMPLETE 4+ VIEW COMPARISON:  CT 01/24/2014 . FINDINGS: Mild scoliosis concave right. Diffuse mild degenerative change. No acute bony abnormality identified. Pelvic calcifications consistent with phleboliths. IMPRESSION: Mild scoliosis and diffuse degenerative change. No acute abnormality. Electronically Signed   By: Marcello Moores  Register   On: 12/06/2015 12:19   Ct Head Wo Contrast  Result Date: 12/19/2015 CLINICAL DATA:  Numbness in the left face, arm, and hand. EXAM: CT HEAD WITHOUT CONTRAST TECHNIQUE: Contiguous axial images were obtained from the base of the skull through the vertex without intravenous contrast. COMPARISON:  None. FINDINGS: Brain: No evidence of acute infarction, hemorrhage, hydrocephalus, extra-axial collection or  mass lesion/mass effect. Subtle rounded density superior to the planum sphenoidale is likely volume averaging when correlated with reformats. Presumed dilated perivascular space below the right putamen. Vascular: No hyperdense vessel or unexpected calcification. Skull: Normal. Negative for fracture or focal lesion. Sinuses/Orbits: Negative IMPRESSION: Negative.  No explanation for symptoms. Electronically Signed   By: Monte Fantasia M.D.   On: 12/19/2015 08:41   Mr Brain Wo Contrast  Result Date: 12/19/2015 CLINICAL DATA:  LEFT-sided weakness and speech difficulty for one-day. History of breast cancer, diabetes, hypertension. EXAM: MRI HEAD WITHOUT CONTRAST TECHNIQUE: Multiplanar, multiecho pulse sequences of the brain and surrounding structures were obtained without intravenous contrast. COMPARISON:  CT HEAD December 19, 2015 at 0827 hours FINDINGS: Multiple sequences are mildly motion degraded. Fast sequences were use. BRAIN: No reduced diffusion to suggest acute ischemia. No susceptibility artifact to suggest hemorrhage. The ventricles and sulci are normal for patient's age. No suspicious parenchymal signal, masses or mass effect. No abnormal extra-axial fluid collections. No extra-axial masses though, contrast enhanced sequences would be more sensitive. VASCULAR: Normal major intracranial vascular flow voids present at skull base. SKULL AND UPPER CERVICAL SPINE: No abnormal sellar expansion. No suspicious  calvarial bone marrow signal. Craniocervical junction maintained. SINUSES/ORBITS: The included ocular globes and orbital contents are non-suspicious. Trace ethmoid mucosal thickening. Mastoid air cells are well aerated. OTHER: None. IMPRESSION: Negative mildly motion degraded MRI head without contrast. Electronically Signed   By: Elon Alas M.D.   On: 12/19/2015 17:28   Ct Angio Chest/abd/pel For Dissection W And/or Wo Contrast  Result Date: 12/19/2015 CLINICAL DATA:  Woke up this morning with  chest pain; then her LT shoulder went numb; now c/o numbness to LT side of face, LT arm and LT hand.; diabetes EXAM: CT ANGIOGRAPHY CHEST, ABDOMEN AND PELVIS TECHNIQUE: Multidetector CT imaging through the chest, abdomen and pelvis was performed using the standard protocol during bolus administration of intravenous contrast. Multiplanar reconstructed images and MIPs were obtained and reviewed to evaluate the vascular anatomy. CONTRAST:  100 mL Isovue COMPARISON:  None. FINDINGS: CTA CHEST FINDINGS Cardiovascular: No contour abnormality of the descending, transverse or descending aorta to suggest dissection or aneurysm. Great vessels are normal. No pericardial fluid. No mediastinal hematoma. No central pulmonary embolism. Mediastinum/Nodes: No axillary supraclavicular adenopathy. No mediastinal hilar adenopathy. Lungs/Pleura: No suspicious pulmonary nodules. No airspace disease or pneumothorax. No pleural fluid. No infarction. Musculoskeletal: No acute osseous findings Review of the MIP images confirms the above findings. CTA ABDOMEN AND PELVIS FINDINGS Hepatobiliary: No focal hepatic lesion. No biliary duct dilatation. Gallbladder is normal. Common bile duct is normal. Pancreas: Pancreas is normal. No ductal dilatation. No pancreatic inflammation. Spleen: Normal spleen Adrenals/urinary tract: Adrenal glands and kidneys are normal. The ureters and bladder normal. Stomach/Bowel: Stomach, small bowel, appendix, and cecum are normal. The colon and rectosigmoid colon are normal. Vascular/Lymphatic: Abdominal aorta is normal caliber. No aneurysm or dissection present. The iliac arteries are normal. The branches of the abdominal aorta are normal including 2 LEFT renal arteries. Reproductive: Post hysterectomy anatomy.  No adnexal abnormality. Other: There is a LEFT inguinal hernia which contains a approximately 12 cm segment of nonobstructed small bowel. Musculoskeletal: No aggressive osseous lesion. Review of the MIP  images confirms the above findings. IMPRESSION: Chest Impression: 1. No evidence of aortic dissection or aneurysm. 2. No acute pulmonary parenchymal findings. Abdomen / Pelvis Impression: 1. No evidence of abdominal aortic aneurysm or dissection. 2. LEFT inguinal hernia contains a 12 cm segment of nonobstructed small bowel. Electronically Signed   By: Suzy Bouchard M.D.   On: 12/19/2015 18:01    Echo: - Left ventricle: The cavity size was normal. Wall thickness was   normal. Systolic function was normal. The estimated ejection   fraction was in the range of 60% to 65%. Wall motion was normal;   there were no regional wall motion abnormalities.   Subjective: Numbness has resolved.  Discharge Exam: Vitals:   12/20/15 0600 12/20/15 1506  BP: (!) 100/55 110/69  Pulse: 63 70  Resp:  20  Temp: 98 F (36.7 C) 98.1 F (36.7 C)   Vitals:   12/19/15 2200 12/20/15 0200 12/20/15 0600 12/20/15 1506  BP: 107/63 (!) 102/59 (!) 100/55 110/69  Pulse: 91 (!) 57 63 70  Resp: 20   20  Temp: 98.7 F (37.1 C) 97.8 F (36.6 C) 98 F (36.7 C) 98.1 F (36.7 C)  TempSrc: Oral Oral Oral Oral  SpO2: 96% 95% 97% 100%  Weight:      Height:        General: Pt is alert, awake, not in acute distress Cardiovascular: RRR, S1/S2 +, no rubs, no gallops Respiratory: CTA bilaterally, no  wheezing, no rhonchi Abdominal: Soft, NT, ND, bowel sounds + Extremities: no edema, no cyanosis    The results of significant diagnostics from this hospitalization (including imaging, microbiology, ancillary and laboratory) are listed below for reference.     Microbiology: No results found for this or any previous visit (from the past 240 hour(s)).   Labs: BNP (last 3 results) No results for input(s): BNP in the last 8760 hours. Basic Metabolic Panel:  Recent Labs Lab 12/19/15 0755 12/19/15 1649 12/19/15 2345  NA 135  --   --   K 3.7  --   --   CL 99*  --   --   CO2 27  --   --   GLUCOSE 285*  --  360*   BUN 6  --   --   CREATININE 0.44 0.57  --   CALCIUM 9.3  --   --    Liver Function Tests: No results for input(s): AST, ALT, ALKPHOS, BILITOT, PROT, ALBUMIN in the last 168 hours. No results for input(s): LIPASE, AMYLASE in the last 168 hours. No results for input(s): AMMONIA in the last 168 hours. CBC:  Recent Labs Lab 12/19/15 0755 12/19/15 1649  WBC 9.7 15.3*  NEUTROABS 6.0  --   HGB 15.3* 15.3*  HCT 44.5 44.7  MCV 85.9 86.8  PLT 254 285   Cardiac Enzymes:  Recent Labs Lab 12/19/15 0755 12/19/15 1649 12/19/15 2027 12/19/15 2213  TROPONINI <0.03 <0.03 <0.03 <0.03   BNP: Invalid input(s): POCBNP CBG:  Recent Labs Lab 12/19/15 1639 12/19/15 2053 12/20/15 0743 12/20/15 1114  GLUCAP 396* 490* 262* 329*   D-Dimer No results for input(s): DDIMER in the last 72 hours. Hgb A1c No results for input(s): HGBA1C in the last 72 hours. Lipid Profile No results for input(s): CHOL, HDL, LDLCALC, TRIG, CHOLHDL, LDLDIRECT in the last 72 hours. Thyroid function studies  Recent Labs  12/19/15 1649  TSH 0.939   Anemia work up No results for input(s): VITAMINB12, FOLATE, FERRITIN, TIBC, IRON, RETICCTPCT in the last 72 hours. Urinalysis    Component Value Date/Time   COLORURINE YELLOW 06/17/2015 2202   APPEARANCEUR CLEAR 06/17/2015 2202   LABSPEC 1.028 06/17/2015 2202   PHURINE 5.5 06/17/2015 2202   GLUCOSEU >1000 (A) 06/17/2015 2202   HGBUR NEGATIVE 06/17/2015 2202   HGBUR negative 12/30/2006 1457   BILIRUBINUR negative 12/06/2015 1204   BILIRUBINUR neg 10/24/2014 1332   KETONESUR trace (5) (A) 12/06/2015 1204   KETONESUR NEGATIVE 06/17/2015 2202   PROTEINUR negative 12/06/2015 1204   PROTEINUR NEGATIVE 06/17/2015 2202   UROBILINOGEN 0.2 12/06/2015 1204   UROBILINOGEN 0.2 02/04/2015 1413   NITRITE Positive (A) 12/06/2015 1204   NITRITE NEGATIVE 06/17/2015 2202   LEUKOCYTESUR Trace (A) 12/06/2015 1204   Sepsis Labs Invalid input(s): PROCALCITONIN,  WBC,   LACTICIDVEN Microbiology No results found for this or any previous visit (from the past 240 hour(s)).   Time coordinating discharge: Over 30 minutes  SIGNED:   Kathie Dike, MD  [facility] 12/20/2015, 3:42 PM Pager   If 7PM-7AM, please contact night-coverage [email or web address] Password [email or web address]

## 2015-12-20 NOTE — Care Management Obs Status (Signed)
[facility] NOTIFICATION   Patient Details  Name: Ann Lopez MRN: [id number] Date of Birth: [birth year]   Medicare Observation Status Notification Given:  Yes    Alioune Hodgkin, Chauncey Reading, RN 12/20/2015, 8:44 AM

## 2015-12-20 NOTE — Progress Notes (Signed)
Stat glucose verification 360, as per medlevel 5 units of nonLog given.

## 2015-12-23 ENCOUNTER — Encounter: Payer: Self-pay | Admitting: Internal Medicine

## 2015-12-23 ENCOUNTER — Ambulatory Visit: Payer: Self-pay | Admitting: Internal Medicine

## 2016-01-04 ENCOUNTER — Ambulatory Visit (INDEPENDENT_AMBULATORY_CARE_PROVIDER_SITE_OTHER): Payer: Medicare Other

## 2016-01-04 ENCOUNTER — Ambulatory Visit (INDEPENDENT_AMBULATORY_CARE_PROVIDER_SITE_OTHER): Payer: Medicare Other | Admitting: Family Medicine

## 2016-01-04 ENCOUNTER — Encounter: Payer: Self-pay | Admitting: Family Medicine

## 2016-01-04 VITALS — BP 112/68 | HR 84 | Temp 98.4°F | Resp 16 | Ht 62.0 in | Wt 181.2 lb

## 2016-01-04 DIAGNOSIS — M542 Cervicalgia: Secondary | ICD-10-CM | POA: Diagnosis not present

## 2016-01-04 DIAGNOSIS — F341 Dysthymic disorder: Secondary | ICD-10-CM

## 2016-01-04 DIAGNOSIS — R072 Precordial pain: Secondary | ICD-10-CM | POA: Diagnosis not present

## 2016-01-04 DIAGNOSIS — M545 Low back pain: Secondary | ICD-10-CM | POA: Diagnosis not present

## 2016-01-04 DIAGNOSIS — R5381 Other malaise: Secondary | ICD-10-CM

## 2016-01-04 DIAGNOSIS — E559 Vitamin D deficiency, unspecified: Secondary | ICD-10-CM | POA: Diagnosis not present

## 2016-01-04 DIAGNOSIS — G8929 Other chronic pain: Secondary | ICD-10-CM

## 2016-01-04 DIAGNOSIS — K0889 Other specified disorders of teeth and supporting structures: Secondary | ICD-10-CM

## 2016-01-04 DIAGNOSIS — M5489 Other dorsalgia: Secondary | ICD-10-CM | POA: Diagnosis not present

## 2016-01-04 DIAGNOSIS — E119 Type 2 diabetes mellitus without complications: Secondary | ICD-10-CM

## 2016-01-04 DIAGNOSIS — R202 Paresthesia of skin: Secondary | ICD-10-CM

## 2016-01-04 DIAGNOSIS — R5383 Other fatigue: Secondary | ICD-10-CM

## 2016-01-04 DIAGNOSIS — E785 Hyperlipidemia, unspecified: Secondary | ICD-10-CM | POA: Diagnosis not present

## 2016-01-04 LAB — COMPREHENSIVE METABOLIC PANEL
ALT: 15 U/L (ref 6–29)
AST: 14 U/L (ref 10–30)
Albumin: 4.1 g/dL (ref 3.6–5.1)
Alkaline Phosphatase: 66 U/L (ref 33–115)
BILIRUBIN TOTAL: 0.7 mg/dL (ref 0.2–1.2)
BUN: 7 mg/dL (ref 7–25)
CO2: 21 mmol/L (ref 20–31)
CREATININE: 0.56 mg/dL (ref 0.50–1.10)
Calcium: 9.2 mg/dL (ref 8.6–10.2)
Chloride: 101 mmol/L (ref 98–110)
GLUCOSE: 260 mg/dL — AB (ref 65–99)
Potassium: 4.1 mmol/L (ref 3.5–5.3)
SODIUM: 135 mmol/L (ref 135–146)
Total Protein: 7 g/dL (ref 6.1–8.1)

## 2016-01-04 LAB — CBC WITH DIFFERENTIAL/PLATELET
BASOS PCT: 0 %
Basophils Absolute: 0 cells/uL (ref 0–200)
EOS PCT: 1 %
Eosinophils Absolute: 119 cells/uL (ref 15–500)
HCT: 46 % — ABNORMAL HIGH (ref 35.0–45.0)
Hemoglobin: 15.6 g/dL — ABNORMAL HIGH (ref 11.7–15.5)
Lymphocytes Relative: 21 %
Lymphs Abs: 2499 cells/uL (ref 850–3900)
MCH: 29.2 pg (ref 27.0–33.0)
MCHC: 33.9 g/dL (ref 32.0–36.0)
MCV: 86.1 fL (ref 80.0–100.0)
MONOS PCT: 8 %
MPV: 10.2 fL (ref 7.5–12.5)
Monocytes Absolute: 952 cells/uL — ABNORMAL HIGH (ref 200–950)
Neutro Abs: 8330 cells/uL — ABNORMAL HIGH (ref 1500–7800)
Neutrophils Relative %: 70 %
PLATELETS: 260 10*3/uL (ref 140–400)
RBC: 5.34 MIL/uL — ABNORMAL HIGH (ref 3.80–5.10)
RDW: 14.4 % (ref 11.0–15.0)
WBC: 11.9 10*3/uL — ABNORMAL HIGH (ref 3.8–10.8)

## 2016-01-04 LAB — GLUCOSE, POCT (MANUAL RESULT ENTRY): POC Glucose: 232 mg/dl — AB (ref 70–99)

## 2016-01-04 LAB — LIPID PANEL
CHOL/HDL RATIO: 7.3 ratio — AB (ref <=5.0)
CHOLESTEROL: 191 mg/dL (ref 125–200)
HDL: 26 mg/dL — AB (ref >=46)
Triglycerides: 830 mg/dL — ABNORMAL HIGH (ref <150)

## 2016-01-04 LAB — VITAMIN D 25 HYDROXY (VIT D DEFICIENCY, FRACTURES): Vit D, 25-Hydroxy: 15 ng/mL — ABNORMAL LOW (ref 30–100)

## 2016-01-04 MED ORDER — HYDROCODONE-ACETAMINOPHEN 7.5-325 MG PO TABS: 1.0000 | ORAL_TABLET | Freq: Two times a day (BID) | ORAL | 0 refills | Status: DC | PRN

## 2016-01-04 MED ORDER — DESVENLAFAXINE SUCCINATE ER 50 MG PO TB24: 50.0000 mg | ORAL_TABLET | Freq: Every day | ORAL | 5 refills | Status: DC

## 2016-01-04 MED ORDER — INSULIN PEN NEEDLE 32G X 4 MM MISC: 1.0000 "application " | Freq: Every day | 6 refills | Status: DC

## 2016-01-04 NOTE — Progress Notes (Signed)
By signing my name below, I, Mesha Guinyard, attest that this documentation has been prepared under the direction and in the presence of Reginia Forts, MD.  Electronically Signed: Verlee Monte, Medical Scribe. 01/04/2016. 10:31 AM.  Subjective:    Patient ID: Ann Lopez, female    DOB: Aug 16, 1970, 45 y.o.   MRN: [id number]  01/04/2016  Medication Refill (Hydrocodone, insulin needles )   HPI  HPI Comments: Ann Lopez is a 45 y.o. female who presents to the [facility] for DM and pain management follow-up. Pt's sugars are poorly controlled so we started lantus 10-20 untis daily, increased her cymbalta to 2 tablets daily. I referred her to orthopedics for chronic back pain. Pt is taking daily pain medication and is on benzodiazepines. Clonazapam was prescribed by Dr. Joseph Art May 3rd. She was admitted to the ED 9/12 for chest pain. Cardiac enzymes were negative, EKG were non-acute, echocardiogram was unremarkable, MRI of the brain was negative. They increased her lantus to 20 units QHS. Pt had a banana around 2-3 am.  Pt has an appt with cardiology 01/20/16. Pt has an appt with Dr. Delilah Shan, ortho, 01/13/16. Pt was told to rest and avoid strenuous activities. Pt is still having intermittent chest pain without triggers or other occurring symptoms. Pt states she only had numbness in her left shoulder 3 days ago, as well as episodic sharp pain in her neck, and episodic left elbow pain. Pt reports associated symptoms of fatigue, and dizziness with walking. Pt has been in the bed for fatigue, and she feels like she's going to pass out. Pt doesn't want to drive because of the near-syncopal episodes. Pt states she feels like she's been put out on the road and felt like trucks have been running over her. Pt states her left upper arm was numb then radiated up her neck to her cheek for +24 hours. Pt states her heart would drop to 47 while in the ED, up to 58-60, and then drop again.  Pt's last stress test was 3-4 years ago. Pt states her bp has always been 90-100/60. Pt states she ate before her triglycerides were checked, but mentions she has always had a problem with her triglycerides. Pt recalls being told she had a high risk of premature heart disease at 45 y/o due to high triglycerides. Pt states she has always had a problem with her Vit D levels.  DM: Pt is not taking lantus because she doesn't have the pens for injections- pt has lantus in her refrigerator. Pt's blood sugar at 2-3am was 168, and she ate a banana shortly afterwards. Lab Results  Component Value Date   HGBA1C 11.1 10/27/2015   Lab Results  Component Value Date   MICROALBUR 0.3 04/25/2015   Depression/Anxiety: Pt stopped taking cymbalta yesterday because it's been making her depression worse. Pt was taking it for a 4-5 months. Pt would like to get back on pristiq until she can get with a psychiatrist. Pt went to the therapist yesterday, and was told she took cymbalta in the past and it didn't work for her. Pt was told to call [facility], but her [facility] insurance won't cover her anymore. Pt was on Geodon 80 mg and she was out for 18 hours and she couldn't get woken up. It also brought her blood sugar up to 600, and it took 5 hours to get her blood sugar down. Depression screen [facility] 2/9 01/04/2016 12/06/2015 10/27/2015 09/29/2015 08/30/2015  Decreased Interest 0  0 0 0 0  Down, Depressed, Hopeless 0 0 1 0 0  PHQ - 2 Score 0 0 1 0 0  Altered sleeping - - - - -  Tired, decreased energy - - - - -  Change in appetite - - - - -  Feeling bad or failure about yourself  - - - - -  Trouble concentrating - - - - -  Moving slowly or fidgety/restless - - - - -  Suicidal thoughts - - - - -  PHQ-9 Score - - - - -  Difficult doing work/chores - - - - -   Review of Systems  Constitutional: Positive for fatigue. Negative for chills, diaphoresis and fever.  Eyes: Negative for visual disturbance.  Respiratory:  Negative for cough and shortness of breath.   Cardiovascular: Positive for chest pain. Negative for palpitations and leg swelling.  Gastrointestinal: Negative for abdominal pain, constipation, diarrhea, nausea and vomiting.  Endocrine: Negative for cold intolerance, heat intolerance, polydipsia, polyphagia and polyuria.  Musculoskeletal: Positive for arthralgias and neck pain.  Neurological: Positive for dizziness, light-headedness and numbness. Negative for tremors, seizures, syncope, facial asymmetry, speech difficulty, weakness and headaches.    Past Medical History:  Diagnosis Date  . Allergy   . Anxiety   . Asthma   . Back pain   . Breast cancer ([place])   . Cancer ([place])   . Depression   . Diabetes mellitus   . Headache   . Hyperlipemia   . Osteopenia   . Osteoporosis    osteopenia   Past Surgical History:  Procedure Laterality Date  . ABDOMINAL HYSTERECTOMY     B oophorectomy for BRCA1 gene  . BREAST SURGERY    . CESAREAN SECTION    . INCISION AND DRAINAGE ABSCESS Left 11/14/2012   Procedure: INCISION AND DRAINAGE ABSCESS LEFT GROIN;  Surgeon: Jamesetta So, MD;  Location: [facility];  Service: General;  Laterality: Left;  . LYMPHADENECTOMY    . MASTECTOMY Bilateral    Allergies  Allergen Reactions  . Azithromycin     Unknown  . Morphine And Related     Can tolerate hydrocodone and dilaudid without side effects  . Nitrofurantoin     Unknown  . Penicillins Hives and Itching    Has patient had a PCN reaction causing immediate rash, facial/tongue/throat swelling, SOB or lightheadedness with hypotension: Yes Has patient had a PCN reaction causing severe rash involving mucus membranes or skin necrosis: No Has patient had a PCN reaction that required hospitalization No Has patient had a PCN reaction occurring within the last 10 years: No If all of the above answers are "NO", then may proceed with Cephalosporin use.   . Sulfonamide Derivatives Hives    Social History    Social History  . Marital status: Single    Spouse name: N/A  . Number of children: 4  . Years of education: 51   Occupational History  . Disabled    Social History Main Topics  . Smoking status: Current Some Day Smoker    Packs/day: 0.50    Types: Cigarettes  . Smokeless tobacco: Never Used  . Alcohol use No  . Drug use: No  . Sexual activity: Not Currently   Other Topics Concern  . Not on file   Social History Narrative   Marital status: single      Lives: at home with her four sons (12, 92, 14, 70); no grandchildren      Employment: disability for  breast cancer, DDD lumbar, anxiety/panic attacks      Tobacco: 1 ppd       Alcohol: none         Right-handed.   2-3 cups caffeine daily.   Family History  Problem Relation Age of Onset  . Cancer Mother 71    breast cancer  . Hypertension Sister   . Hypothyroidism Sister   . Cancer Sister   . Hypothyroidism Brother   . Cancer Maternal Grandmother 95    breast cancer  . Cancer Maternal Grandfather 55    pancreatic cancer       Objective:    BP 112/68 (BP Location: Right Arm, Patient Position: Sitting, Cuff Size: Normal)   Pulse 84   Temp 98.4 F (36.9 C) (Oral)   Resp 16   Ht _0  (1.575 m)   Wt 181 lb 3.2 oz (82.2 kg)   SpO2 97%   BMI 33.14 kg/m  Physical Exam  Constitutional: She is oriented to person, place, and time. She appears well-developed and well-nourished. No distress.  HENT:  Head: Normocephalic and atraumatic.  Right Ear: External ear normal.  Left Ear: External ear normal.  Nose: Nose normal.  Mouth/Throat: Oropharynx is clear and moist.  Eyes: Conjunctivae and EOM are normal. Pupils are equal, round, and reactive to light.  Neck: Normal range of motion. Neck supple. Carotid bruit is not present. No thyromegaly present.  c-spine non tender  Cardiovascular: Normal rate, regular rhythm, normal heart sounds and intact distal pulses.  Exam reveals no gallop and no friction rub.   No  murmur heard. Pulmonary/Chest: Effort normal and breath sounds normal. She has no wheezes. She has no rales.  Abdominal: Soft. Bowel sounds are normal. She exhibits no distension and no mass. There is no tenderness. There is no rebound and no guarding.  Musculoskeletal:  shoulder non tender  Lymphadenopathy:    She has no cervical adenopathy.  Neurological: She is alert and oriented to person, place, and time. No cranial nerve deficit.  Skin: Skin is warm and dry. No rash noted. She is not diaphoretic. No erythema. No pallor.  Psychiatric: She has a normal mood and affect. Her behavior is normal.  Nursing note and vitals reviewed.    No results found. Assessment & Plan:   1. Type 2 diabetes mellitus without complication, without long-term current use of insulin ([place])   2. DEPRESSION/ANXIETY   3. Precordial pain   4. Paresthesias   5. Malaise and fatigue   6. Dyslipidemia   7. Other back pain   8. Chronic low back pain   9. Pain, dental   10. Neck pain on left side    - Neck x-ray ordered due to current neck and upper extremity symptoms/pain. - rx needles to take lantus - Referral to ortho for back pain and neck pain; discussed that pt cannot be maintained on benzos and narcotics; need to start weaning off of Clonazepam; I will no longer prescribe an benzos.  I am concerned of daily narcotic use in such a young patient.  No recent work up for chronic pain back; no recent physical therapy of trial of injections.  Refill of hydrocodone provided. - Take lantus tonight and start taking a multi-vitamin -  note for school - Stop taking Cymbalta and start taking pristiq instead - Call [facility] to see if they take your insurace   Orders Placed This Encounter  Procedures  . DG Cervical Spine Complete    Standing Status:  Future    Number of Occurrences:   1    Standing Expiration Date:   01/03/2017    Order Specific Question:   Reason for Exam (SYMPTOM  OR DIAGNOSIS REQUIRED)     Answer:   l SHOULDER AND NECK NUMBNESS; L CHEST PAIN    Order Specific Question:   Is the patient pregnant?    Answer:   No    Comments:   HYSTERECTOMY    Order Specific Question:   Preferred imaging location?    Answer:   External  . Comprehensive metabolic panel  . CBC with Differential/Platelet  . Lipid panel    Order Specific Question:   Has the patient fasted?    Answer:   Yes  . VITAMIN D 25 Hydroxy (Vit-D Deficiency, Fractures)  . Ambulatory referral to Orthopedic Surgery    Referral Priority:   Routine    Referral Type:   Surgical    Referral Reason:   Specialty Services Required    Requested Specialty:   Orthopedic Surgery    Number of Visits Requested:   1  . POCT glucose (manual entry)   Meds ordered this encounter  Medications  . Insulin Pen Needle 32G X 4 MM MISC    Sig: 1 application by Does not apply route daily.    Dispense:  100 each    Refill:  6    PLEASE ADMINISTER INSULIN NEEDLE TO USE WITH LANTUS  . HYDROcodone-acetaminophen (NORCO) 7.5-325 MG tablet    Sig: Take 1 tablet by mouth 2 (two) times daily as needed for moderate pain.    Dispense:  60 tablet    Refill:  0  . desvenlafaxine (PRISTIQ) 50 MG 24 hr tablet    Sig: Take 1 tablet (50 mg total) by mouth daily.    Dispense:  30 tablet    Refill:  5    Return in about 4 weeks (around 02/01/2016) for recheck .   I personally performed the services described in this documentation, which was scribed in my presence. The recorded information has been reviewed and considered.   Gladyce Mcray Elayne Guerin, M.D. [facility] [address] [place], [place]  [address] [phone] phone [phone] fax

## 2016-01-04 NOTE — Patient Instructions (Addendum)
1.  Start taking Lantus 20 units at bedtime. 2.  Discuss neck numbness and chronic lower back pain with Dr. Delilah Shan.     IF you received an x-ray today, you will receive an invoice from [facility]. Please contact [facility] at [phone] with questions or concerns regarding your invoice.   IF you received labwork today, you will receive an invoice from [employer]. Please contact [facility] at [phone] with questions or concerns regarding your invoice.   Our billing staff will not be able to assist you with questions regarding bills from these companies.  You will be contacted with the lab results as soon as they are available. The fastest way to get your results is to activate your My Chart account. Instructions are located on the last page of this paperwork. If you have not heard from [place] regarding the results in 2 weeks, please contact this office.

## 2016-01-11 DIAGNOSIS — E119 Type 2 diabetes mellitus without complications: Secondary | ICD-10-CM | POA: Diagnosis not present

## 2016-01-13 DIAGNOSIS — M545 Low back pain: Secondary | ICD-10-CM | POA: Diagnosis not present

## 2016-01-13 DIAGNOSIS — M5137 Other intervertebral disc degeneration, lumbosacral region: Secondary | ICD-10-CM | POA: Diagnosis not present

## 2016-01-18 DIAGNOSIS — J989 Respiratory disorder, unspecified: Secondary | ICD-10-CM | POA: Diagnosis not present

## 2016-01-19 NOTE — Progress Notes (Deleted)
Cardiology Office Note   Date:  01/19/2016   ID:  Ann Lopez, DOB 09/23/1970, MRN [id number]  PCP:  [facility]  Cardiologist:   Jenkins Rouge, MD   No chief complaint on file.     History of Present Illness: Ann Lopez is a 45 y.o. female who presents for evaluation of chest pain. CRF;s IDDM, elevated lipids , smoking and  ? HTN not on ACE.  Seen in [facility] 12/20/15 with [facility]. Radiation to left amr, neck with paresthesias. Lasted 10 minutes and  Resolved spontaneously. Numbness in arm lasted most of day. R/O negative troponin no acute ECG changes and MRI Negative for any stroke.  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 Allergy   . Anxiety   . Asthma   . Back pain   . Breast cancer ([place])   . Cancer ([place])   . Depression   . Diabetes mellitus   . Headache   . Hyperlipemia   . Osteopenia   . Osteoporosis    osteopenia    Past Surgical History:  Procedure Laterality Date  . ABDOMINAL HYSTERECTOMY     B oophorectomy for BRCA1 gene  . BREAST SURGERY    . CESAREAN SECTION    . INCISION AND DRAINAGE ABSCESS Left 11/14/2012   Procedure: INCISION AND DRAINAGE ABSCESS LEFT GROIN;  Surgeon: Jamesetta So, MD;  Location: [facility];  Service: General;  Laterality: Left;  . LYMPHADENECTOMY    . MASTECTOMY Bilateral      Current Outpatient Prescriptions  Medication Sig Dispense Refill  . albuterol (PROVENTIL HFA;VENTOLIN HFA) 108 (90 Base) MCG/ACT inhaler Inhale 1 puff into the lungs every 6 (six) hours as needed for wheezing or shortness of breath. 1 Inhaler 6  . albuterol (PROVENTIL) (2.5 MG/3ML) 0.083% nebulizer solution Take 3 mLs (2.5 mg total) by nebulization every 6 (six) hours as needed for wheezing or shortness of breath. 75 mL 12  . aspirin EC 81 MG tablet Take 1  tablet (81 mg total) by mouth daily. 30 tablet 0  . canagliflozin (INVOKANA) 300 MG TABS tablet Take 1 tablet (300 mg total) by mouth daily before breakfast. 90 tablet 3  . Choline Fenofibrate (FENOFIBRIC ACID) 135 MG CPDR TAKE ONE CAPSULE EVERY DAY 90 capsule 0  . desvenlafaxine (PRISTIQ) 50 MG 24 hr tablet Take 1 tablet (50 mg total) by mouth daily. 30 tablet 5  . DULoxetine (CYMBALTA) 60 MG capsule Take 2 capsules (120 mg total) by mouth daily. (Patient taking differently: Take 60 mg by mouth daily. ) 180 capsule 3  . glucose blood (CHOICE DM FORA G20 TEST STRIPS) test strip Use as instructed 100 each 12  . HYDROcodone-acetaminophen (NORCO) 7.5-325 MG tablet Take 1 tablet by mouth 2 (two) times daily as needed for moderate pain. 60 tablet 0  . ibuprofen (ADVIL,MOTRIN) 200 MG tablet Take 800 mg by mouth every 6 (six) hours as needed for fever. Reported on 07/28/2015    . Insulin Glargine (LANTUS SOLOSTAR) 100 UNIT/ML Solostar Pen Inject 20 Units into the skin daily at 10 pm. 5 pen PRN  . Insulin Pen Needle 32G X 4 MM MISC 1 application by Does not apply route daily. 100 each 6  . Lancets (ONETOUCH ULTRASOFT) lancets Use as instructed; check sugar  once daily 100 each 12  . mometasone (NASONEX) 50 MCG/ACT nasal spray Place 2 sprays into the nose daily. 17 g 12  . omeprazole (PRILOSEC) 40 MG capsule Take 1 capsule (40 mg total) by mouth daily. 30 capsule 5  . tiZANidine (ZANAFLEX) 4 MG tablet Take 1 tablet (4 mg total) by mouth every 8 (eight) hours as needed for muscle spasms. 45 tablet 0  . varenicline (CHANTIX CONTINUING MONTH PAK) 1 MG tablet Take 1 tablet (1 mg total) by mouth 2 (two) times daily. 60 tablet 2   No current facility-administered medications for this visit.     Allergies:   Azithromycin; Morphine and related; Nitrofurantoin; Penicillins; and Sulfonamide derivatives    Social History:  The patient  reports that she has been smoking Cigarettes.  She has been smoking about 0.50  packs per day. She has never used smokeless tobacco. She reports that she does not drink alcohol or use drugs.   Family History:  The patient's family history includes Cancer in her sister; Cancer (age of onset: 41) in her mother; Cancer (age of onset: 57) in her maternal grandfather and maternal grandmother; Hypertension in her sister; Hypothyroidism in her brother and sister.    ROS:  Please see the history of present illness.   Otherwise, review of systems are positive for none.   All other systems are reviewed and negative.    PHYSICAL EXAM: VS:  There were no vitals taken for this visit. , BMI There is no height or weight on file to calculate BMI. Affect appropriate Healthy:  appears stated age 24: normal Neck supple with no adenopathy JVP normal no bruits no thyromegaly Lungs clear with no wheezing and good diaphragmatic motion Heart:  S1/S2 no murmur, no rub, gallop or click PMI normal Abdomen: benighn, BS positve, no tenderness, no AAA no bruit.  No HSM or HJR Distal pulses intact with no bruits No edema Neuro non-focal Skin warm and dry No muscular weakness    EKG:   SR rate 64 normal 12/20/15   Recent Labs: 12/19/2015: TSH 0.939 01/04/2016: ALT 15; BUN 7; Creat 0.56; Hemoglobin 15.6; Platelets 260; Potassium 4.1; Sodium 135    Lipid Panel    Component Value Date/Time   CHOL 191 01/04/2016 1044   TRIG 830 (H) 01/04/2016 1044   HDL 26 (L) 01/04/2016 1044   CHOLHDL 7.3 (H) 01/04/2016 1044   VLDL NOT CALC 01/04/2016 1044   LDLCALC NOT CALC 01/04/2016 1044      Wt Readings from Last 3 Encounters:  01/04/16 82.2 kg (181 lb 3.2 oz)  12/19/15 82.9 kg (182 lb 11.2 oz)  12/06/15 83.9 kg (185 lb)      Other studies Reviewed: Additional studies/ records that were reviewed today include: ER/hospital notes 9/12 Echo ECG Imaging studies neck CXR.    ASSESSMENT AND PLAN:  1.  Chest Pain;   Current medicines are reviewed at length with the patient today.  The  patient does not have concerns regarding medicines.  The following changes have been made:  no change  Labs/ tests ordered today include: lexiscan myovue  No orders of the defined types were placed in this encounter.    Disposition:   FU with PRN if myovue normal      Signed, Jenkins Rouge, MD  01/19/2016 12:26 PM    [facility] [address], [place], [place]  [address] Phone: [phone]; Fax: [phone]

## 2016-01-20 ENCOUNTER — Encounter: Payer: Self-pay | Admitting: Cardiovascular Disease

## 2016-01-28 DIAGNOSIS — M545 Low back pain: Secondary | ICD-10-CM | POA: Diagnosis not present

## 2016-01-30 DIAGNOSIS — M545 Low back pain: Secondary | ICD-10-CM | POA: Diagnosis not present

## 2016-01-30 DIAGNOSIS — M5137 Other intervertebral disc degeneration, lumbosacral region: Secondary | ICD-10-CM | POA: Diagnosis not present

## 2016-01-30 DIAGNOSIS — G8929 Other chronic pain: Secondary | ICD-10-CM | POA: Diagnosis not present

## 2016-01-31 ENCOUNTER — Ambulatory Visit: Payer: Medicare Other | Admitting: Family Medicine

## 2016-02-26 ENCOUNTER — Encounter (HOSPITAL_COMMUNITY): Payer: Self-pay | Admitting: *Deleted

## 2016-02-26 ENCOUNTER — Emergency Department (HOSPITAL_COMMUNITY)
Admission: EM | Admit: 2016-02-26 | Discharge: 2016-02-26 | Disposition: A | Discharge status: Home / Self Care | Payer: Medicare Other | Attending: Emergency Medicine | Admitting: Emergency Medicine

## 2016-02-26 ENCOUNTER — Emergency Department (HOSPITAL_COMMUNITY): Payer: Medicare Other

## 2016-02-26 DIAGNOSIS — Z853 Personal history of malignant neoplasm of breast: Secondary | ICD-10-CM | POA: Insufficient documentation

## 2016-02-26 DIAGNOSIS — I10 Essential (primary) hypertension: Secondary | ICD-10-CM | POA: Diagnosis not present

## 2016-02-26 DIAGNOSIS — R51 Headache: Secondary | ICD-10-CM | POA: Diagnosis not present

## 2016-02-26 DIAGNOSIS — R05 Cough: Secondary | ICD-10-CM | POA: Insufficient documentation

## 2016-02-26 DIAGNOSIS — F17210 Nicotine dependence, cigarettes, uncomplicated: Secondary | ICD-10-CM | POA: Insufficient documentation

## 2016-02-26 DIAGNOSIS — R110 Nausea: Secondary | ICD-10-CM | POA: Insufficient documentation

## 2016-02-26 DIAGNOSIS — J45909 Unspecified asthma, uncomplicated: Secondary | ICD-10-CM | POA: Diagnosis not present

## 2016-02-26 DIAGNOSIS — R079 Chest pain, unspecified: Secondary | ICD-10-CM | POA: Insufficient documentation

## 2016-02-26 DIAGNOSIS — Z794 Long term (current) use of insulin: Secondary | ICD-10-CM | POA: Diagnosis not present

## 2016-02-26 DIAGNOSIS — E119 Type 2 diabetes mellitus without complications: Secondary | ICD-10-CM | POA: Insufficient documentation

## 2016-02-26 DIAGNOSIS — Z79899 Other long term (current) drug therapy: Secondary | ICD-10-CM | POA: Insufficient documentation

## 2016-02-26 DIAGNOSIS — R0789 Other chest pain: Secondary | ICD-10-CM | POA: Diagnosis not present

## 2016-02-26 DIAGNOSIS — Z7982 Long term (current) use of aspirin: Secondary | ICD-10-CM | POA: Insufficient documentation

## 2016-02-26 LAB — CBC WITH DIFFERENTIAL/PLATELET
Basophils Absolute: 0.1 10*3/uL (ref 0.0–0.1)
Basophils Relative: 1 %
EOS ABS: 0.1 10*3/uL (ref 0.0–0.7)
EOS PCT: 1 %
HCT: 46.8 % — ABNORMAL HIGH (ref 36.0–46.0)
Hemoglobin: 16.5 g/dL — ABNORMAL HIGH (ref 12.0–15.0)
LYMPHS ABS: 2.9 10*3/uL (ref 0.7–4.0)
Lymphocytes Relative: 30 %
MCH: 30.3 pg (ref 26.0–34.0)
MCHC: 35.3 g/dL (ref 30.0–36.0)
MCV: 85.9 fL (ref 78.0–100.0)
MONOS PCT: 5 %
Monocytes Absolute: 0.5 10*3/uL (ref 0.1–1.0)
Neutro Abs: 6.1 10*3/uL (ref 1.7–7.7)
Neutrophils Relative %: 63 %
PLATELETS: 267 10*3/uL (ref 150–400)
RBC: 5.45 MIL/uL — ABNORMAL HIGH (ref 3.87–5.11)
RDW: 14 % (ref 11.5–15.5)
WBC: 9.7 10*3/uL (ref 4.0–10.5)

## 2016-02-26 LAB — BASIC METABOLIC PANEL
Anion gap: 11 (ref 5–15)
BUN: 5 mg/dL — AB (ref 6–20)
CHLORIDE: 96 mmol/L — AB (ref 101–111)
CO2: 25 mmol/L (ref 22–32)
CREATININE: 0.57 mg/dL (ref 0.44–1.00)
Calcium: 9.4 mg/dL (ref 8.9–10.3)
GFR calc Af Amer: >60 mL/min (ref >60)
GFR calc non Af Amer: >60 mL/min (ref >60)
Glucose, Bld: 342 mg/dL — ABNORMAL HIGH (ref 65–99)
Potassium: 4 mmol/L (ref 3.5–5.1)
SODIUM: 132 mmol/L — AB (ref 135–145)

## 2016-02-26 LAB — TROPONIN I

## 2016-02-26 LAB — D-DIMER, QUANTITATIVE: D-Dimer, Quant: 0.28 ug/mL-FEU (ref 0.00–0.50)

## 2016-02-26 MED ORDER — NAPROXEN 500 MG PO TABS: 500.0000 mg | ORAL_TABLET | Freq: Two times a day (BID) | ORAL | 0 refills | Status: DC

## 2016-02-26 MED ORDER — CYCLOBENZAPRINE HCL 10 MG PO TABS: 10.0000 mg | ORAL_TABLET | Freq: Two times a day (BID) | ORAL | 0 refills | Status: DC | PRN

## 2016-02-26 MED ORDER — OXYCODONE-ACETAMINOPHEN 5-325 MG PO TABS
1.0000 | ORAL_TABLET | Freq: Once | ORAL | Status: AC
  Administered 2016-02-26: 1 via ORAL
  Filled 2016-02-26: qty 1

## 2016-02-26 NOTE — ED Notes (Signed)
Pt given pillow.  Noted to be crying.  Pain assessed and pt states "what strength percocet did she give me?" Advised pt of strength to which pt stated "that isn't going to do anything for me."  Lights turned down per request.

## 2016-02-26 NOTE — Discharge Instructions (Signed)
Tests showed no evidence of a heart attack or a blood clot in your lung.  Prescription for pain medicine and muscle relaxer. Follow-up your primary care doctor.

## 2016-02-26 NOTE — ED Notes (Signed)
Patient is resting comfortably. 

## 2016-02-26 NOTE — ED Triage Notes (Addendum)
Pt c/o chest pain that started a few hours ago with nausea, pain is described as being "sharp" pt also c/o headache

## 2016-02-26 NOTE — ED Provider Notes (Signed)
[facility] DEPT Provider Note   CSN: [id number] Arrival date & time: 02/26/16  0541     History   Chief Complaint Chief Complaint  Patient presents with  . Chest Pain    HPI Ann Lopez is a 45 y.o. female.  HPI  This is a 45 year old female with a history of diabetes, hyperlipidemia, breast cancer who presents with chest pain. Patient reports 6 hour history of chest pain. Initially she states that it felt pressure-like. It is currently stabbing. She also has had a 2 day history of left scapular pain. Worse with reading. Denies fever. Does endorse cough. No history of blood clots or lower extremity swelling. Denies any shortness of breath. Does endorse headache over the last 2-3 hours. She describes it as pressure and nonradiating.  Past Medical History:  Diagnosis Date  . Allergy   . Anxiety   . Asthma   . Back pain   . Breast cancer ([place])   . Cancer ([place])   . Depression   . Diabetes mellitus   . Headache   . Hyperlipemia   . Osteopenia   . Osteoporosis    osteopenia    Patient Active Problem List   Diagnosis Date Noted  . Chest pain 12/19/2015  . Numbness 12/19/2015  . Femoral hernia 03/24/2014  . Dyslipidemia 09/10/2011  . BRCA1 positive 09/10/2011  . Breast cancer ([place]) 09/10/2011  . H/O gastric bypass; 05/14/11([facility]) 09/10/2011  . Essential hypertension 10/28/2008  . DEPRESSION/ANXIETY 02/19/2007  . CARPAL TUNNEL SYNDROME, RIGHT 02/19/2007  . ALLERGIC RHINITIS 02/19/2007  . GERD 02/19/2007  . IRRITABLE BOWEL SYNDROME 02/19/2007  . HERPES GENITALIS 12/30/2006  . Diabetes ([place]) 12/30/2006  . OSTEOPENIA 12/30/2006  . ANOMALY, CONGENITAL, NERVOUS SYSTEM NEC 12/30/2006    Past Surgical History:  Procedure Laterality Date  . ABDOMINAL HYSTERECTOMY     B oophorectomy for BRCA1 gene  . BREAST SURGERY    . CESAREAN SECTION    . INCISION AND DRAINAGE ABSCESS Left 11/14/2012   Procedure: INCISION AND DRAINAGE ABSCESS LEFT GROIN;  Surgeon: Jamesetta So, MD;  Location: [facility];  Service: General;  Laterality: Left;  . LYMPHADENECTOMY    . MASTECTOMY Bilateral     OB History    No data available       Home Medications    Prior to Admission medications   Medication Sig Start Date End Date Taking? Authorizing Provider  albuterol (PROVENTIL HFA;VENTOLIN HFA) 108 (90 Base) MCG/ACT inhaler Inhale 1 puff into the lungs every 6 (six) hours as needed for wheezing or shortness of breath. 07/28/15   Robyn Haber, MD  albuterol (PROVENTIL) (2.5 MG/3ML) 0.083% nebulizer solution Take 3 mLs (2.5 mg total) by nebulization every 6 (six) hours as needed for wheezing or shortness of breath. 7/82/95   Delora Fuel, MD  aspirin EC 81 MG tablet Take 1 tablet (81 mg total) by mouth daily. 12/20/15   Kathie Dike, MD  canagliflozin (INVOKANA) 300 MG TABS tablet Take 1 tablet (300 mg total) by mouth daily before breakfast. 06/28/15   Robyn Haber, MD  Choline Fenofibrate (FENOFIBRIC ACID) 135 MG CPDR TAKE ONE CAPSULE EVERY DAY 05/18/15   Robyn Haber, MD  desvenlafaxine (PRISTIQ) 50 MG 24 hr tablet Take 1 tablet (50 mg total) by mouth daily. 01/04/16   Wardell Honour, MD  DULoxetine (CYMBALTA) 60 MG capsule Take 2 capsules (120 mg total) by mouth daily. Patient taking differently: Take 60 mg by mouth daily.  12/06/15   Steffanie Dunn  Koren Bound, MD  glucose blood (CHOICE DM FORA G20 TEST STRIPS) test strip Use as instructed 10/27/15   Wardell Honour, MD  HYDROcodone-acetaminophen (NORCO) 7.5-325 MG tablet Take 1 tablet by mouth 2 (two) times daily as needed for moderate pain. 01/04/16   Wardell Honour, MD  ibuprofen (ADVIL,MOTRIN) 200 MG tablet Take 800 mg by mouth every 6 (six) hours as needed for fever. Reported on 07/28/2015    Historical Provider, MD  Insulin Glargine (LANTUS SOLOSTAR) 100 UNIT/ML Solostar Pen Inject 20 Units into the skin daily at 10 pm. 12/20/15   Kathie Dike, MD  Insulin Pen Needle 32G X 4 MM MISC 1 application by Does not apply route  daily. 01/04/16   Wardell Honour, MD  Lancets [employer] ULTRASOFT) lancets Use as instructed; check sugar once daily 10/27/15   Wardell Honour, MD  mometasone (NASONEX) 50 MCG/ACT nasal spray Place 2 sprays into the nose daily. 01/20/15   Robyn Haber, MD  omeprazole (PRILOSEC) 40 MG capsule Take 1 capsule (40 mg total) by mouth daily. 10/27/15   Wardell Honour, MD  tiZANidine (ZANAFLEX) 4 MG tablet Take 1 tablet (4 mg total) by mouth every 8 (eight) hours as needed for muscle spasms. 10/27/15   Wardell Honour, MD  varenicline (CHANTIX CONTINUING MONTH PAK) 1 MG tablet Take 1 tablet (1 mg total) by mouth 2 (two) times daily. 10/27/15   Wardell Honour, MD    Family History Family History  Problem Relation Age of Onset  . Cancer Mother 43    breast cancer  . Hypertension Sister   . Hypothyroidism Sister   . Cancer Sister   . Hypothyroidism Brother   . Cancer Maternal Grandmother 79    breast cancer  . Cancer Maternal Grandfather 37    pancreatic cancer    Social History Social History  Substance Use Topics  . Smoking status: Current Some Day Smoker    Packs/day: 0.50    Types: Cigarettes  . Smokeless tobacco: Never Used  . Alcohol use No     Allergies   Azithromycin; Morphine and related; Nitrofurantoin; Penicillins; and Sulfonamide derivatives   Review of Systems Review of Systems  Constitutional: Negative for fever.  Respiratory: Positive for cough. Negative for shortness of breath.   Cardiovascular: Positive for chest pain.  Gastrointestinal: Positive for nausea. Negative for abdominal pain, diarrhea and vomiting.  Neurological: Positive for headaches. Negative for weakness.  All other systems reviewed and are negative.    Physical Exam Updated Vital Signs BP 104/80   Pulse 81   Temp 98 F (36.7 C) (Oral)   Resp 22   Ht _0  (1.575 m)   Wt 178 lb (80.7 kg)   SpO2 98%   BMI 32.56 kg/m   Physical Exam  Constitutional: She is oriented to person, place, and  time. She appears well-developed and well-nourished.  HENT:  Head: Normocephalic and atraumatic.  Cardiovascular: Normal rate, regular rhythm and normal heart sounds.   No murmur heard. Pulmonary/Chest: Effort normal and breath sounds normal. No respiratory distress. She has no wheezes. She exhibits no tenderness.  Abdominal: Soft. Bowel sounds are normal. There is no tenderness. There is no guarding.  Musculoskeletal: She exhibits no edema.  Neurological: She is alert and oriented to person, place, and time.  Skin: Skin is warm and dry.  Psychiatric: She has a normal mood and affect.  Nursing note and vitals reviewed.    ED Treatments / Results  Labs (  all labs ordered are listed, but only abnormal results are displayed) Labs Reviewed  CBC WITH DIFFERENTIAL/PLATELET  BASIC METABOLIC PANEL  TROPONIN I  D-DIMER, QUANTITATIVE (NOT AT [facility])    EKG  EKG Interpretation  Date/Time:  Sunday February 26 2016 05:50:30 EST Ventricular Rate:  85 PR Interval:    QRS Duration: 88 QT Interval:  382 QTC Calculation: 455 R Axis:   65 Text Interpretation:  Sinus rhythm Probable anteroseptal infarct, old No significant change since last tracing Confirmed by Jaylah Goodlow  MD, [place] ([id number]) on 02/26/2016 5:53:27 AM       Radiology Dg Chest 2 View  Result Date: 02/26/2016 CLINICAL DATA:  Chest pain, several hours duration. EXAM: CHEST  2 VIEW COMPARISON:  None 02/2016 FINDINGS: The lungs are clear. The pulmonary vasculature is normal. Heart size is normal. Hilar and mediastinal contours are unremarkable. There is no pleural effusion. IMPRESSION: No active cardiopulmonary disease. Electronically Signed   By: Andreas Newport M.D.   On: 02/26/2016 06:34    Procedures Procedures (including critical care time)  Medications Ordered in ED Medications  oxyCODONE-acetaminophen (PERCOCET/ROXICET) 5-325 MG per tablet 1 tablet (1 tablet Oral Given 02/26/16 3494)     Initial Impression /  Assessment and Plan / ED Course  I have reviewed the triage vital signs and the nursing notes.  Pertinent labs & imaging results that were available during my care of the patient were reviewed by me and considered in my medical decision making (see chart for details).  Clinical Course     Patient presents with chest pain. History of similar symptoms in the past. She is nontoxic. Vital signs are reassuring. Physical exam is benign. No classic ACS symptoms. EKG is nonischemic. Denies abdominal symptoms. Given pain with breathing, will obtain d-dimer. Troponin and d-dimer are pending. Chest x-ray reassuring. Signed out to oncoming physician. If workup negative, feel patient is low risk enough to be discharged with outpatient follow-up.    Final Clinical Impressions(s) / ED Diagnoses   Final diagnoses:  None    New Prescriptions New Prescriptions   No medications on file     Merryl Hacker, MD 02/26/16 2342

## 2016-02-26 NOTE — ED Provider Notes (Signed)
Patient rechecked at 2035:  She was sleeping comfortably. Discussed normal troponin and d-dimer. Discharge medication Naprosyn 500 mg and Flexeril 10 mg   Nat Christen, MD 02/26/16 [phone]

## 2016-03-07 DIAGNOSIS — M545 Low back pain: Secondary | ICD-10-CM | POA: Diagnosis not present

## 2016-03-13 DIAGNOSIS — Z0271 Encounter for disability determination: Secondary | ICD-10-CM

## 2016-03-28 DIAGNOSIS — C50912 Malignant neoplasm of unspecified site of left female breast: Secondary | ICD-10-CM | POA: Diagnosis not present

## 2016-03-28 DIAGNOSIS — Z170 Estrogen receptor positive status [ER+]: Secondary | ICD-10-CM | POA: Diagnosis not present

## 2016-04-04 DIAGNOSIS — E11319 Type 2 diabetes mellitus with unspecified diabetic retinopathy without macular edema: Secondary | ICD-10-CM | POA: Diagnosis not present

## 2016-04-11 DIAGNOSIS — E119 Type 2 diabetes mellitus without complications: Secondary | ICD-10-CM | POA: Diagnosis not present

## 2016-04-11 DIAGNOSIS — B349 Viral infection, unspecified: Secondary | ICD-10-CM | POA: Diagnosis not present

## 2016-04-11 DIAGNOSIS — J029 Acute pharyngitis, unspecified: Secondary | ICD-10-CM | POA: Diagnosis not present

## 2016-04-13 ENCOUNTER — Encounter: Payer: Self-pay | Admitting: Family Medicine

## 2016-04-13 MED ORDER — FENOFIBRATE 145 MG PO TABS
145.0000 mg | ORAL_TABLET | Freq: Every day | ORAL | 1 refills | Status: DC
Start: 2016-04-13 — End: 2019-03-31

## 2016-04-13 NOTE — Addendum Note (Signed)
Addended by: Wardell Honour on: 04/13/2016 04:02 PM   Modules accepted: Orders

## 2016-05-07 DIAGNOSIS — E1140 Type 2 diabetes mellitus with diabetic neuropathy, unspecified: Secondary | ICD-10-CM | POA: Diagnosis not present

## 2016-05-09 DIAGNOSIS — E782 Mixed hyperlipidemia: Secondary | ICD-10-CM | POA: Diagnosis not present

## 2016-05-09 DIAGNOSIS — E119 Type 2 diabetes mellitus without complications: Secondary | ICD-10-CM | POA: Diagnosis not present

## 2016-06-25 DIAGNOSIS — M545 Low back pain: Secondary | ICD-10-CM | POA: Diagnosis not present

## 2016-06-25 DIAGNOSIS — M791 Myalgia: Secondary | ICD-10-CM | POA: Diagnosis not present

## 2016-06-25 DIAGNOSIS — M5416 Radiculopathy, lumbar region: Secondary | ICD-10-CM | POA: Diagnosis not present

## 2016-06-25 DIAGNOSIS — G894 Chronic pain syndrome: Secondary | ICD-10-CM | POA: Diagnosis not present

## 2016-07-11 DIAGNOSIS — E119 Type 2 diabetes mellitus without complications: Secondary | ICD-10-CM | POA: Diagnosis not present

## 2016-07-11 DIAGNOSIS — E1140 Type 2 diabetes mellitus with diabetic neuropathy, unspecified: Secondary | ICD-10-CM | POA: Diagnosis not present

## 2016-07-23 DIAGNOSIS — M545 Low back pain: Secondary | ICD-10-CM | POA: Diagnosis not present

## 2016-07-23 DIAGNOSIS — G894 Chronic pain syndrome: Secondary | ICD-10-CM | POA: Diagnosis not present

## 2016-08-20 DIAGNOSIS — M545 Low back pain: Secondary | ICD-10-CM | POA: Diagnosis not present

## 2016-08-20 DIAGNOSIS — G894 Chronic pain syndrome: Secondary | ICD-10-CM | POA: Diagnosis not present

## 2016-08-22 ENCOUNTER — Emergency Department (HOSPITAL_COMMUNITY): Payer: Medicare Other

## 2016-08-22 ENCOUNTER — Encounter (HOSPITAL_COMMUNITY): Payer: Self-pay | Admitting: *Deleted

## 2016-08-22 ENCOUNTER — Emergency Department (HOSPITAL_COMMUNITY)
Admission: EM | Admit: 2016-08-22 | Discharge: 2016-08-22 | Disposition: A | Discharge status: Home / Self Care | Payer: Medicare Other | Attending: Emergency Medicine | Admitting: Emergency Medicine

## 2016-08-22 ENCOUNTER — Other Ambulatory Visit: Payer: Self-pay

## 2016-08-22 DIAGNOSIS — R202 Paresthesia of skin: Secondary | ICD-10-CM | POA: Diagnosis not present

## 2016-08-22 DIAGNOSIS — R0981 Nasal congestion: Secondary | ICD-10-CM | POA: Diagnosis not present

## 2016-08-22 DIAGNOSIS — R072 Precordial pain: Secondary | ICD-10-CM | POA: Diagnosis not present

## 2016-08-22 DIAGNOSIS — Z794 Long term (current) use of insulin: Secondary | ICD-10-CM | POA: Diagnosis not present

## 2016-08-22 DIAGNOSIS — E1165 Type 2 diabetes mellitus with hyperglycemia: Secondary | ICD-10-CM | POA: Diagnosis not present

## 2016-08-22 DIAGNOSIS — F17210 Nicotine dependence, cigarettes, uncomplicated: Secondary | ICD-10-CM | POA: Diagnosis not present

## 2016-08-22 DIAGNOSIS — Z7982 Long term (current) use of aspirin: Secondary | ICD-10-CM | POA: Diagnosis not present

## 2016-08-22 DIAGNOSIS — R079 Chest pain, unspecified: Secondary | ICD-10-CM | POA: Diagnosis not present

## 2016-08-22 DIAGNOSIS — J45909 Unspecified asthma, uncomplicated: Secondary | ICD-10-CM | POA: Insufficient documentation

## 2016-08-22 DIAGNOSIS — Z853 Personal history of malignant neoplasm of breast: Secondary | ICD-10-CM | POA: Diagnosis not present

## 2016-08-22 DIAGNOSIS — Z79899 Other long term (current) drug therapy: Secondary | ICD-10-CM | POA: Insufficient documentation

## 2016-08-22 DIAGNOSIS — R0789 Other chest pain: Secondary | ICD-10-CM | POA: Diagnosis present

## 2016-08-22 DIAGNOSIS — R05 Cough: Secondary | ICD-10-CM | POA: Insufficient documentation

## 2016-08-22 DIAGNOSIS — R739 Hyperglycemia, unspecified: Secondary | ICD-10-CM

## 2016-08-22 LAB — BASIC METABOLIC PANEL
ANION GAP: 9 (ref 5–15)
BUN: 7 mg/dL (ref 6–20)
CALCIUM: 9.1 mg/dL (ref 8.9–10.3)
CO2: 28 mmol/L (ref 22–32)
CREATININE: 0.59 mg/dL (ref 0.44–1.00)
Chloride: 99 mmol/L — ABNORMAL LOW (ref 101–111)
GLUCOSE: 338 mg/dL — AB (ref 65–99)
Potassium: 3.9 mmol/L (ref 3.5–5.1)
Sodium: 136 mmol/L (ref 135–145)

## 2016-08-22 LAB — CBC WITH DIFFERENTIAL/PLATELET
BASOS ABS: 0.1 10*3/uL (ref 0.0–0.1)
BASOS PCT: 0 %
EOS ABS: 0.1 10*3/uL (ref 0.0–0.7)
Eosinophils Relative: 1 %
HCT: 45.2 % (ref 36.0–46.0)
Hemoglobin: 15.6 g/dL — ABNORMAL HIGH (ref 12.0–15.0)
Lymphocytes Relative: 17 %
Lymphs Abs: 2.8 10*3/uL (ref 0.7–4.0)
MCH: 29.8 pg (ref 26.0–34.0)
MCHC: 34.5 g/dL (ref 30.0–36.0)
MCV: 86.4 fL (ref 78.0–100.0)
MONO ABS: 0.5 10*3/uL (ref 0.1–1.0)
MONOS PCT: 3 %
Neutro Abs: 12.6 10*3/uL — ABNORMAL HIGH (ref 1.7–7.7)
Neutrophils Relative %: 79 %
PLATELETS: 184 10*3/uL (ref 150–400)
RBC: 5.23 MIL/uL — ABNORMAL HIGH (ref 3.87–5.11)
RDW: 13.8 % (ref 11.5–15.5)
WBC: 16.1 10*3/uL — ABNORMAL HIGH (ref 4.0–10.5)

## 2016-08-22 LAB — TROPONIN I

## 2016-08-22 MED ORDER — ALBUTEROL SULFATE (2.5 MG/3ML) 0.083% IN NEBU
5.0000 mg | INHALATION_SOLUTION | Freq: Once | RESPIRATORY_TRACT | Status: AC
  Administered 2016-08-22: 5 mg via RESPIRATORY_TRACT
  Filled 2016-08-22: qty 6

## 2016-08-22 NOTE — Discharge Instructions (Signed)

## 2016-08-22 NOTE — ED Triage Notes (Signed)
Pt c/o central chest pain that started a couple of hours ago

## 2016-08-22 NOTE — ED Notes (Signed)
Patient has had EKG done on 12 lead cardiac monitoring at this time.
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
## 2016-08-22 NOTE — ED Notes (Signed)
Patient ripped off EKG leads, sticker, and blood pressure cuff, unable to monitor.

## 2016-09-17 DIAGNOSIS — G894 Chronic pain syndrome: Secondary | ICD-10-CM | POA: Diagnosis not present

## 2016-09-17 DIAGNOSIS — M545 Low back pain: Secondary | ICD-10-CM | POA: Diagnosis not present

## 2016-10-16 DIAGNOSIS — G894 Chronic pain syndrome: Secondary | ICD-10-CM | POA: Diagnosis not present

## 2016-10-16 DIAGNOSIS — M545 Low back pain: Secondary | ICD-10-CM | POA: Diagnosis not present

## 2016-12-03 DIAGNOSIS — Z79891 Long term (current) use of opiate analgesic: Secondary | ICD-10-CM | POA: Diagnosis not present

## 2016-12-03 DIAGNOSIS — M545 Low back pain: Secondary | ICD-10-CM | POA: Diagnosis not present

## 2016-12-03 DIAGNOSIS — G894 Chronic pain syndrome: Secondary | ICD-10-CM | POA: Diagnosis not present

## 2016-12-04 DIAGNOSIS — I10 Essential (primary) hypertension: Secondary | ICD-10-CM | POA: Diagnosis not present

## 2016-12-04 DIAGNOSIS — H6990 Unspecified Eustachian tube disorder, unspecified ear: Secondary | ICD-10-CM | POA: Diagnosis not present

## 2016-12-04 DIAGNOSIS — G8929 Other chronic pain: Secondary | ICD-10-CM | POA: Diagnosis not present

## 2016-12-04 DIAGNOSIS — E1165 Type 2 diabetes mellitus with hyperglycemia: Secondary | ICD-10-CM | POA: Diagnosis not present

## 2016-12-11 DIAGNOSIS — E781 Pure hyperglyceridemia: Secondary | ICD-10-CM | POA: Diagnosis not present

## 2016-12-11 DIAGNOSIS — I10 Essential (primary) hypertension: Secondary | ICD-10-CM | POA: Diagnosis not present

## 2016-12-11 DIAGNOSIS — G8929 Other chronic pain: Secondary | ICD-10-CM | POA: Diagnosis not present

## 2016-12-11 DIAGNOSIS — E1165 Type 2 diabetes mellitus with hyperglycemia: Secondary | ICD-10-CM | POA: Diagnosis not present

## 2017-01-02 DIAGNOSIS — Z79891 Long term (current) use of opiate analgesic: Secondary | ICD-10-CM | POA: Diagnosis not present

## 2017-01-02 DIAGNOSIS — M545 Low back pain: Secondary | ICD-10-CM | POA: Diagnosis not present

## 2017-01-02 DIAGNOSIS — G894 Chronic pain syndrome: Secondary | ICD-10-CM | POA: Diagnosis not present

## 2017-01-09 DIAGNOSIS — R109 Unspecified abdominal pain: Secondary | ICD-10-CM | POA: Diagnosis not present

## 2017-01-09 DIAGNOSIS — G8929 Other chronic pain: Secondary | ICD-10-CM | POA: Diagnosis not present

## 2017-01-09 DIAGNOSIS — E781 Pure hyperglyceridemia: Secondary | ICD-10-CM | POA: Diagnosis not present

## 2017-01-09 DIAGNOSIS — E1165 Type 2 diabetes mellitus with hyperglycemia: Secondary | ICD-10-CM | POA: Diagnosis not present

## 2017-01-25 DIAGNOSIS — E781 Pure hyperglyceridemia: Secondary | ICD-10-CM | POA: Diagnosis not present

## 2017-01-25 DIAGNOSIS — E1140 Type 2 diabetes mellitus with diabetic neuropathy, unspecified: Secondary | ICD-10-CM | POA: Diagnosis not present

## 2017-01-25 DIAGNOSIS — I10 Essential (primary) hypertension: Secondary | ICD-10-CM | POA: Diagnosis not present

## 2017-01-25 DIAGNOSIS — B349 Viral infection, unspecified: Secondary | ICD-10-CM | POA: Diagnosis not present

## 2017-01-25 DIAGNOSIS — E1165 Type 2 diabetes mellitus with hyperglycemia: Secondary | ICD-10-CM | POA: Diagnosis not present

## 2017-01-28 DIAGNOSIS — D72829 Elevated white blood cell count, unspecified: Secondary | ICD-10-CM | POA: Diagnosis not present

## 2017-01-28 DIAGNOSIS — E1165 Type 2 diabetes mellitus with hyperglycemia: Secondary | ICD-10-CM | POA: Diagnosis not present

## 2017-01-28 DIAGNOSIS — E781 Pure hyperglyceridemia: Secondary | ICD-10-CM | POA: Diagnosis not present

## 2017-01-28 DIAGNOSIS — Z23 Encounter for immunization: Secondary | ICD-10-CM | POA: Diagnosis not present

## 2017-01-28 DIAGNOSIS — J329 Chronic sinusitis, unspecified: Secondary | ICD-10-CM | POA: Diagnosis not present

## 2017-01-30 DIAGNOSIS — G894 Chronic pain syndrome: Secondary | ICD-10-CM | POA: Diagnosis not present

## 2017-01-30 DIAGNOSIS — Z79891 Long term (current) use of opiate analgesic: Secondary | ICD-10-CM | POA: Diagnosis not present

## 2017-01-30 DIAGNOSIS — M545 Low back pain: Secondary | ICD-10-CM | POA: Diagnosis not present

## 2017-02-01 DIAGNOSIS — D72829 Elevated white blood cell count, unspecified: Secondary | ICD-10-CM | POA: Diagnosis not present

## 2017-02-01 DIAGNOSIS — E1165 Type 2 diabetes mellitus with hyperglycemia: Secondary | ICD-10-CM | POA: Diagnosis not present

## 2017-02-01 DIAGNOSIS — R509 Fever, unspecified: Secondary | ICD-10-CM | POA: Diagnosis not present

## 2017-02-01 DIAGNOSIS — J329 Chronic sinusitis, unspecified: Secondary | ICD-10-CM | POA: Diagnosis not present

## 2017-02-06 DIAGNOSIS — Z853 Personal history of malignant neoplasm of breast: Secondary | ICD-10-CM | POA: Diagnosis not present

## 2017-02-08 DIAGNOSIS — D72829 Elevated white blood cell count, unspecified: Secondary | ICD-10-CM | POA: Diagnosis not present

## 2017-02-08 DIAGNOSIS — E781 Pure hyperglyceridemia: Secondary | ICD-10-CM | POA: Diagnosis not present

## 2017-02-08 DIAGNOSIS — G8929 Other chronic pain: Secondary | ICD-10-CM | POA: Diagnosis not present

## 2017-02-14 DIAGNOSIS — C50919 Malignant neoplasm of unspecified site of unspecified female breast: Secondary | ICD-10-CM | POA: Diagnosis not present

## 2017-02-18 DIAGNOSIS — Z853 Personal history of malignant neoplasm of breast: Secondary | ICD-10-CM | POA: Diagnosis not present

## 2017-02-19 DIAGNOSIS — Z853 Personal history of malignant neoplasm of breast: Secondary | ICD-10-CM | POA: Diagnosis not present

## 2017-02-22 DIAGNOSIS — I10 Essential (primary) hypertension: Secondary | ICD-10-CM | POA: Diagnosis not present

## 2017-03-06 DIAGNOSIS — Z79891 Long term (current) use of opiate analgesic: Secondary | ICD-10-CM | POA: Diagnosis not present

## 2017-03-06 DIAGNOSIS — M545 Low back pain: Secondary | ICD-10-CM | POA: Diagnosis not present

## 2017-03-06 DIAGNOSIS — G894 Chronic pain syndrome: Secondary | ICD-10-CM | POA: Diagnosis not present

## 2017-03-20 DIAGNOSIS — Z170 Estrogen receptor positive status [ER+]: Secondary | ICD-10-CM | POA: Diagnosis not present

## 2017-03-20 DIAGNOSIS — C50912 Malignant neoplasm of unspecified site of left female breast: Secondary | ICD-10-CM | POA: Diagnosis not present

## 2017-03-27 DIAGNOSIS — C50912 Malignant neoplasm of unspecified site of left female breast: Secondary | ICD-10-CM | POA: Diagnosis not present

## 2017-03-27 DIAGNOSIS — Z170 Estrogen receptor positive status [ER+]: Secondary | ICD-10-CM | POA: Diagnosis not present

## 2017-03-28 DIAGNOSIS — E781 Pure hyperglyceridemia: Secondary | ICD-10-CM | POA: Diagnosis not present

## 2017-03-28 DIAGNOSIS — Z794 Long term (current) use of insulin: Secondary | ICD-10-CM | POA: Diagnosis not present

## 2017-03-28 DIAGNOSIS — E1140 Type 2 diabetes mellitus with diabetic neuropathy, unspecified: Secondary | ICD-10-CM | POA: Diagnosis not present

## 2017-03-28 DIAGNOSIS — I10 Essential (primary) hypertension: Secondary | ICD-10-CM | POA: Diagnosis not present

## 2017-03-28 DIAGNOSIS — Z720 Tobacco use: Secondary | ICD-10-CM | POA: Diagnosis not present

## 2017-03-28 DIAGNOSIS — E119 Type 2 diabetes mellitus without complications: Secondary | ICD-10-CM | POA: Diagnosis not present

## 2017-04-05 DIAGNOSIS — R079 Chest pain, unspecified: Secondary | ICD-10-CM | POA: Diagnosis not present

## 2017-04-05 DIAGNOSIS — I10 Essential (primary) hypertension: Secondary | ICD-10-CM | POA: Diagnosis not present

## 2017-04-05 DIAGNOSIS — E1140 Type 2 diabetes mellitus with diabetic neuropathy, unspecified: Secondary | ICD-10-CM | POA: Diagnosis not present

## 2017-04-05 DIAGNOSIS — E781 Pure hyperglyceridemia: Secondary | ICD-10-CM | POA: Diagnosis not present

## 2017-04-10 DIAGNOSIS — Z903 Acquired absence of stomach [part of]: Secondary | ICD-10-CM | POA: Diagnosis not present

## 2017-04-10 DIAGNOSIS — Z794 Long term (current) use of insulin: Secondary | ICD-10-CM | POA: Diagnosis not present

## 2017-04-10 DIAGNOSIS — E1100 Type 2 diabetes mellitus with hyperosmolarity without nonketotic hyperglycemic-hyperosmolar coma (NKHHC): Secondary | ICD-10-CM | POA: Diagnosis not present

## 2017-04-10 DIAGNOSIS — K912 Postsurgical malabsorption, not elsewhere classified: Secondary | ICD-10-CM | POA: Diagnosis not present

## 2017-04-15 DIAGNOSIS — K4090 Unilateral inguinal hernia, without obstruction or gangrene, not specified as recurrent: Secondary | ICD-10-CM | POA: Diagnosis not present

## 2017-04-15 DIAGNOSIS — M545 Low back pain: Secondary | ICD-10-CM | POA: Diagnosis not present

## 2017-04-15 DIAGNOSIS — Z79891 Long term (current) use of opiate analgesic: Secondary | ICD-10-CM | POA: Diagnosis not present

## 2017-04-15 DIAGNOSIS — G894 Chronic pain syndrome: Secondary | ICD-10-CM | POA: Diagnosis not present

## 2017-04-15 DIAGNOSIS — Z170 Estrogen receptor positive status [ER+]: Secondary | ICD-10-CM | POA: Diagnosis not present

## 2017-04-15 DIAGNOSIS — C50912 Malignant neoplasm of unspecified site of left female breast: Secondary | ICD-10-CM | POA: Diagnosis not present

## 2017-05-13 DIAGNOSIS — M545 Low back pain: Secondary | ICD-10-CM | POA: Diagnosis not present

## 2017-05-13 DIAGNOSIS — G894 Chronic pain syndrome: Secondary | ICD-10-CM | POA: Diagnosis not present

## 2017-05-13 DIAGNOSIS — Z79891 Long term (current) use of opiate analgesic: Secondary | ICD-10-CM | POA: Diagnosis not present

## 2017-05-23 DIAGNOSIS — E1100 Type 2 diabetes mellitus with hyperosmolarity without nonketotic hyperglycemic-hyperosmolar coma (NKHHC): Secondary | ICD-10-CM | POA: Diagnosis not present

## 2017-05-23 DIAGNOSIS — L987 Excessive and redundant skin and subcutaneous tissue: Secondary | ICD-10-CM | POA: Diagnosis not present

## 2017-05-23 DIAGNOSIS — I10 Essential (primary) hypertension: Secondary | ICD-10-CM | POA: Diagnosis not present

## 2017-05-30 DIAGNOSIS — E1140 Type 2 diabetes mellitus with diabetic neuropathy, unspecified: Secondary | ICD-10-CM | POA: Diagnosis not present

## 2017-05-30 DIAGNOSIS — Z720 Tobacco use: Secondary | ICD-10-CM | POA: Diagnosis not present

## 2017-05-30 DIAGNOSIS — R7301 Impaired fasting glucose: Secondary | ICD-10-CM | POA: Diagnosis not present

## 2017-05-30 DIAGNOSIS — B349 Viral infection, unspecified: Secondary | ICD-10-CM | POA: Diagnosis not present

## 2017-06-10 DIAGNOSIS — G894 Chronic pain syndrome: Secondary | ICD-10-CM | POA: Diagnosis not present

## 2017-06-10 DIAGNOSIS — Z79891 Long term (current) use of opiate analgesic: Secondary | ICD-10-CM | POA: Diagnosis not present

## 2017-06-10 DIAGNOSIS — M545 Low back pain: Secondary | ICD-10-CM | POA: Diagnosis not present

## 2017-07-10 DIAGNOSIS — M545 Low back pain: Secondary | ICD-10-CM | POA: Diagnosis not present

## 2017-07-10 DIAGNOSIS — G894 Chronic pain syndrome: Secondary | ICD-10-CM | POA: Diagnosis not present

## 2017-07-10 DIAGNOSIS — Z79891 Long term (current) use of opiate analgesic: Secondary | ICD-10-CM | POA: Diagnosis not present

## 2017-08-02 DIAGNOSIS — R0600 Dyspnea, unspecified: Secondary | ICD-10-CM | POA: Diagnosis not present

## 2017-08-02 DIAGNOSIS — N390 Urinary tract infection, site not specified: Secondary | ICD-10-CM | POA: Diagnosis not present

## 2017-08-02 DIAGNOSIS — R079 Chest pain, unspecified: Secondary | ICD-10-CM | POA: Diagnosis not present

## 2017-08-02 DIAGNOSIS — J45909 Unspecified asthma, uncomplicated: Secondary | ICD-10-CM | POA: Diagnosis not present

## 2017-08-02 DIAGNOSIS — E1140 Type 2 diabetes mellitus with diabetic neuropathy, unspecified: Secondary | ICD-10-CM | POA: Diagnosis not present

## 2017-08-02 DIAGNOSIS — R300 Dysuria: Secondary | ICD-10-CM | POA: Diagnosis not present

## 2017-08-05 DIAGNOSIS — Z720 Tobacco use: Secondary | ICD-10-CM | POA: Diagnosis not present

## 2017-08-05 DIAGNOSIS — I10 Essential (primary) hypertension: Secondary | ICD-10-CM | POA: Diagnosis not present

## 2017-08-05 DIAGNOSIS — E781 Pure hyperglyceridemia: Secondary | ICD-10-CM | POA: Diagnosis not present

## 2017-08-05 DIAGNOSIS — E1140 Type 2 diabetes mellitus with diabetic neuropathy, unspecified: Secondary | ICD-10-CM | POA: Diagnosis not present

## 2017-08-05 DIAGNOSIS — E785 Hyperlipidemia, unspecified: Secondary | ICD-10-CM | POA: Diagnosis not present

## 2017-08-05 DIAGNOSIS — E119 Type 2 diabetes mellitus without complications: Secondary | ICD-10-CM | POA: Diagnosis not present

## 2017-08-07 DIAGNOSIS — G894 Chronic pain syndrome: Secondary | ICD-10-CM | POA: Diagnosis not present

## 2017-08-07 DIAGNOSIS — Z79891 Long term (current) use of opiate analgesic: Secondary | ICD-10-CM | POA: Diagnosis not present

## 2017-08-07 DIAGNOSIS — M545 Low back pain: Secondary | ICD-10-CM | POA: Diagnosis not present

## 2017-08-20 DIAGNOSIS — E1100 Type 2 diabetes mellitus with hyperosmolarity without nonketotic hyperglycemic-hyperosmolar coma (NKHHC): Secondary | ICD-10-CM | POA: Diagnosis not present

## 2017-08-20 DIAGNOSIS — Z794 Long term (current) use of insulin: Secondary | ICD-10-CM | POA: Diagnosis not present

## 2017-08-20 DIAGNOSIS — Z903 Acquired absence of stomach [part of]: Secondary | ICD-10-CM | POA: Diagnosis not present

## 2017-08-27 DIAGNOSIS — E1140 Type 2 diabetes mellitus with diabetic neuropathy, unspecified: Secondary | ICD-10-CM | POA: Diagnosis not present

## 2017-08-27 DIAGNOSIS — E781 Pure hyperglyceridemia: Secondary | ICD-10-CM | POA: Diagnosis not present

## 2017-08-29 ENCOUNTER — Encounter: Payer: Self-pay | Admitting: Family Medicine

## 2017-09-06 DIAGNOSIS — G894 Chronic pain syndrome: Secondary | ICD-10-CM | POA: Diagnosis not present

## 2017-09-06 DIAGNOSIS — Z79891 Long term (current) use of opiate analgesic: Secondary | ICD-10-CM | POA: Diagnosis not present

## 2017-09-06 DIAGNOSIS — M545 Low back pain: Secondary | ICD-10-CM | POA: Diagnosis not present

## 2017-10-04 DIAGNOSIS — Z79891 Long term (current) use of opiate analgesic: Secondary | ICD-10-CM | POA: Diagnosis not present

## 2017-10-04 DIAGNOSIS — G894 Chronic pain syndrome: Secondary | ICD-10-CM | POA: Diagnosis not present

## 2017-10-04 DIAGNOSIS — M545 Low back pain: Secondary | ICD-10-CM | POA: Diagnosis not present

## 2017-11-06 DIAGNOSIS — G894 Chronic pain syndrome: Secondary | ICD-10-CM | POA: Diagnosis not present

## 2017-11-06 DIAGNOSIS — M545 Low back pain: Secondary | ICD-10-CM | POA: Diagnosis not present

## 2017-11-06 DIAGNOSIS — Z79891 Long term (current) use of opiate analgesic: Secondary | ICD-10-CM | POA: Diagnosis not present

## 2017-11-12 DIAGNOSIS — E119 Type 2 diabetes mellitus without complications: Secondary | ICD-10-CM | POA: Diagnosis not present

## 2017-11-12 DIAGNOSIS — E1140 Type 2 diabetes mellitus with diabetic neuropathy, unspecified: Secondary | ICD-10-CM | POA: Diagnosis not present

## 2017-11-12 DIAGNOSIS — I10 Essential (primary) hypertension: Secondary | ICD-10-CM | POA: Diagnosis not present

## 2017-11-12 DIAGNOSIS — Z720 Tobacco use: Secondary | ICD-10-CM | POA: Diagnosis not present

## 2017-11-20 DIAGNOSIS — E781 Pure hyperglyceridemia: Secondary | ICD-10-CM | POA: Diagnosis not present

## 2017-11-20 DIAGNOSIS — E1140 Type 2 diabetes mellitus with diabetic neuropathy, unspecified: Secondary | ICD-10-CM | POA: Diagnosis not present

## 2017-11-20 DIAGNOSIS — G8929 Other chronic pain: Secondary | ICD-10-CM | POA: Diagnosis not present

## 2017-12-04 DIAGNOSIS — Z79891 Long term (current) use of opiate analgesic: Secondary | ICD-10-CM | POA: Diagnosis not present

## 2017-12-04 DIAGNOSIS — M545 Low back pain: Secondary | ICD-10-CM | POA: Diagnosis not present

## 2017-12-04 DIAGNOSIS — G894 Chronic pain syndrome: Secondary | ICD-10-CM | POA: Diagnosis not present

## 2017-12-13 DIAGNOSIS — Z720 Tobacco use: Secondary | ICD-10-CM | POA: Diagnosis not present

## 2017-12-13 DIAGNOSIS — E1140 Type 2 diabetes mellitus with diabetic neuropathy, unspecified: Secondary | ICD-10-CM | POA: Diagnosis not present

## 2017-12-13 DIAGNOSIS — L0291 Cutaneous abscess, unspecified: Secondary | ICD-10-CM | POA: Diagnosis not present

## 2018-01-01 DIAGNOSIS — G894 Chronic pain syndrome: Secondary | ICD-10-CM | POA: Diagnosis not present

## 2018-01-01 DIAGNOSIS — M545 Low back pain: Secondary | ICD-10-CM | POA: Diagnosis not present

## 2018-01-01 DIAGNOSIS — Z79891 Long term (current) use of opiate analgesic: Secondary | ICD-10-CM | POA: Diagnosis not present

## 2018-01-15 DIAGNOSIS — K3184 Gastroparesis: Secondary | ICD-10-CM | POA: Diagnosis not present

## 2018-01-15 DIAGNOSIS — E781 Pure hyperglyceridemia: Secondary | ICD-10-CM | POA: Diagnosis not present

## 2018-01-15 DIAGNOSIS — E1140 Type 2 diabetes mellitus with diabetic neuropathy, unspecified: Secondary | ICD-10-CM | POA: Diagnosis not present

## 2018-01-15 DIAGNOSIS — B3781 Candidal esophagitis: Secondary | ICD-10-CM | POA: Diagnosis not present

## 2018-01-15 DIAGNOSIS — Z23 Encounter for immunization: Secondary | ICD-10-CM | POA: Diagnosis not present

## 2018-01-28 DIAGNOSIS — E119 Type 2 diabetes mellitus without complications: Secondary | ICD-10-CM | POA: Diagnosis not present

## 2018-01-28 DIAGNOSIS — N760 Acute vaginitis: Secondary | ICD-10-CM | POA: Diagnosis not present

## 2018-01-29 DIAGNOSIS — Z79891 Long term (current) use of opiate analgesic: Secondary | ICD-10-CM | POA: Diagnosis not present

## 2018-01-29 DIAGNOSIS — M545 Low back pain: Secondary | ICD-10-CM | POA: Diagnosis not present

## 2018-01-29 DIAGNOSIS — G894 Chronic pain syndrome: Secondary | ICD-10-CM | POA: Diagnosis not present

## 2018-02-25 DIAGNOSIS — C50912 Malignant neoplasm of unspecified site of left female breast: Secondary | ICD-10-CM | POA: Diagnosis not present

## 2018-02-25 DIAGNOSIS — Z9011 Acquired absence of right breast and nipple: Secondary | ICD-10-CM | POA: Diagnosis not present

## 2018-02-26 DIAGNOSIS — M545 Low back pain: Secondary | ICD-10-CM | POA: Diagnosis not present

## 2018-02-26 DIAGNOSIS — Z79891 Long term (current) use of opiate analgesic: Secondary | ICD-10-CM | POA: Diagnosis not present

## 2018-02-26 DIAGNOSIS — G894 Chronic pain syndrome: Secondary | ICD-10-CM | POA: Diagnosis not present

## 2018-02-27 DIAGNOSIS — C50912 Malignant neoplasm of unspecified site of left female breast: Secondary | ICD-10-CM | POA: Diagnosis not present

## 2018-02-27 DIAGNOSIS — Z9011 Acquired absence of right breast and nipple: Secondary | ICD-10-CM | POA: Diagnosis not present

## 2018-03-12 DIAGNOSIS — C50912 Malignant neoplasm of unspecified site of left female breast: Secondary | ICD-10-CM | POA: Diagnosis not present

## 2018-03-18 DIAGNOSIS — E1140 Type 2 diabetes mellitus with diabetic neuropathy, unspecified: Secondary | ICD-10-CM | POA: Diagnosis not present

## 2018-03-18 DIAGNOSIS — G8929 Other chronic pain: Secondary | ICD-10-CM | POA: Diagnosis not present

## 2018-03-18 DIAGNOSIS — I10 Essential (primary) hypertension: Secondary | ICD-10-CM | POA: Diagnosis not present

## 2018-03-18 DIAGNOSIS — E781 Pure hyperglyceridemia: Secondary | ICD-10-CM | POA: Diagnosis not present

## 2018-03-18 DIAGNOSIS — B349 Viral infection, unspecified: Secondary | ICD-10-CM | POA: Diagnosis not present

## 2018-03-26 DIAGNOSIS — L0291 Cutaneous abscess, unspecified: Secondary | ICD-10-CM | POA: Diagnosis not present

## 2018-03-27 DIAGNOSIS — I10 Essential (primary) hypertension: Secondary | ICD-10-CM | POA: Diagnosis not present

## 2018-03-27 DIAGNOSIS — E119 Type 2 diabetes mellitus without complications: Secondary | ICD-10-CM | POA: Diagnosis not present

## 2018-03-27 DIAGNOSIS — X398XXA Other exposure to forces of nature, initial encounter: Secondary | ICD-10-CM | POA: Diagnosis not present

## 2018-03-27 DIAGNOSIS — E1140 Type 2 diabetes mellitus with diabetic neuropathy, unspecified: Secondary | ICD-10-CM | POA: Diagnosis not present

## 2018-03-27 DIAGNOSIS — Z720 Tobacco use: Secondary | ICD-10-CM | POA: Diagnosis not present

## 2018-03-27 DIAGNOSIS — L0291 Cutaneous abscess, unspecified: Secondary | ICD-10-CM | POA: Diagnosis not present

## 2018-03-27 DIAGNOSIS — L02214 Cutaneous abscess of groin: Secondary | ICD-10-CM | POA: Diagnosis not present

## 2018-03-28 DIAGNOSIS — L0291 Cutaneous abscess, unspecified: Secondary | ICD-10-CM | POA: Diagnosis not present

## 2018-03-28 DIAGNOSIS — I10 Essential (primary) hypertension: Secondary | ICD-10-CM | POA: Diagnosis not present

## 2018-03-28 DIAGNOSIS — Z720 Tobacco use: Secondary | ICD-10-CM | POA: Diagnosis not present

## 2018-03-28 DIAGNOSIS — E1140 Type 2 diabetes mellitus with diabetic neuropathy, unspecified: Secondary | ICD-10-CM | POA: Diagnosis not present

## 2018-04-03 DIAGNOSIS — L0291 Cutaneous abscess, unspecified: Secondary | ICD-10-CM | POA: Diagnosis not present

## 2018-04-03 DIAGNOSIS — L02214 Cutaneous abscess of groin: Secondary | ICD-10-CM | POA: Diagnosis not present

## 2018-04-03 DIAGNOSIS — E1140 Type 2 diabetes mellitus with diabetic neuropathy, unspecified: Secondary | ICD-10-CM | POA: Diagnosis not present

## 2018-04-24 DIAGNOSIS — G894 Chronic pain syndrome: Secondary | ICD-10-CM | POA: Diagnosis not present

## 2018-04-24 DIAGNOSIS — M545 Low back pain: Secondary | ICD-10-CM | POA: Diagnosis not present

## 2018-04-24 DIAGNOSIS — Z79891 Long term (current) use of opiate analgesic: Secondary | ICD-10-CM | POA: Diagnosis not present

## 2018-06-18 DIAGNOSIS — C50812 Malignant neoplasm of overlapping sites of left female breast: Secondary | ICD-10-CM | POA: Diagnosis not present

## 2018-06-18 DIAGNOSIS — Z170 Estrogen receptor positive status [ER+]: Secondary | ICD-10-CM | POA: Diagnosis not present

## 2018-06-18 DIAGNOSIS — M546 Pain in thoracic spine: Secondary | ICD-10-CM | POA: Diagnosis not present

## 2018-06-18 DIAGNOSIS — C44311 Basal cell carcinoma of skin of nose: Secondary | ICD-10-CM | POA: Diagnosis not present

## 2018-06-19 DIAGNOSIS — Z79891 Long term (current) use of opiate analgesic: Secondary | ICD-10-CM | POA: Diagnosis not present

## 2018-06-19 DIAGNOSIS — G894 Chronic pain syndrome: Secondary | ICD-10-CM | POA: Diagnosis not present

## 2018-06-19 DIAGNOSIS — M545 Low back pain: Secondary | ICD-10-CM | POA: Diagnosis not present

## 2018-07-02 DIAGNOSIS — M546 Pain in thoracic spine: Secondary | ICD-10-CM | POA: Diagnosis not present

## 2018-07-02 DIAGNOSIS — C50812 Malignant neoplasm of overlapping sites of left female breast: Secondary | ICD-10-CM | POA: Diagnosis not present

## 2018-07-02 DIAGNOSIS — Z170 Estrogen receptor positive status [ER+]: Secondary | ICD-10-CM | POA: Diagnosis not present

## 2018-08-18 DIAGNOSIS — E781 Pure hyperglyceridemia: Secondary | ICD-10-CM | POA: Diagnosis not present

## 2018-08-18 DIAGNOSIS — E119 Type 2 diabetes mellitus without complications: Secondary | ICD-10-CM | POA: Diagnosis not present

## 2018-09-17 DIAGNOSIS — Z79891 Long term (current) use of opiate analgesic: Secondary | ICD-10-CM | POA: Diagnosis not present

## 2018-09-17 DIAGNOSIS — G894 Chronic pain syndrome: Secondary | ICD-10-CM | POA: Diagnosis not present

## 2018-09-17 DIAGNOSIS — M545 Low back pain: Secondary | ICD-10-CM | POA: Diagnosis not present

## 2018-09-30 DIAGNOSIS — Z23 Encounter for immunization: Secondary | ICD-10-CM | POA: Diagnosis not present

## 2018-09-30 DIAGNOSIS — E119 Type 2 diabetes mellitus without complications: Secondary | ICD-10-CM | POA: Diagnosis not present

## 2018-09-30 DIAGNOSIS — E781 Pure hyperglyceridemia: Secondary | ICD-10-CM | POA: Diagnosis not present

## 2018-09-30 DIAGNOSIS — H539 Unspecified visual disturbance: Secondary | ICD-10-CM | POA: Diagnosis not present

## 2018-09-30 DIAGNOSIS — L0291 Cutaneous abscess, unspecified: Secondary | ICD-10-CM | POA: Diagnosis not present

## 2018-09-30 DIAGNOSIS — K4190 Unilateral femoral hernia, without obstruction or gangrene, not specified as recurrent: Secondary | ICD-10-CM | POA: Diagnosis not present

## 2018-10-20 DIAGNOSIS — G894 Chronic pain syndrome: Secondary | ICD-10-CM | POA: Diagnosis not present

## 2018-10-20 DIAGNOSIS — M545 Low back pain: Secondary | ICD-10-CM | POA: Diagnosis not present

## 2018-10-20 DIAGNOSIS — Z79891 Long term (current) use of opiate analgesic: Secondary | ICD-10-CM | POA: Diagnosis not present

## 2018-11-13 DIAGNOSIS — G8929 Other chronic pain: Secondary | ICD-10-CM | POA: Diagnosis not present

## 2019-01-05 DIAGNOSIS — M545 Low back pain: Secondary | ICD-10-CM | POA: Diagnosis not present

## 2019-01-05 DIAGNOSIS — Z79891 Long term (current) use of opiate analgesic: Secondary | ICD-10-CM | POA: Diagnosis not present

## 2019-01-05 DIAGNOSIS — G894 Chronic pain syndrome: Secondary | ICD-10-CM | POA: Diagnosis not present

## 2019-01-15 DIAGNOSIS — E119 Type 2 diabetes mellitus without complications: Secondary | ICD-10-CM | POA: Diagnosis not present

## 2019-01-15 DIAGNOSIS — Z23 Encounter for immunization: Secondary | ICD-10-CM | POA: Diagnosis not present

## 2019-01-15 DIAGNOSIS — E1140 Type 2 diabetes mellitus with diabetic neuropathy, unspecified: Secondary | ICD-10-CM | POA: Diagnosis not present

## 2019-01-15 DIAGNOSIS — L0291 Cutaneous abscess, unspecified: Secondary | ICD-10-CM | POA: Diagnosis not present

## 2019-01-15 DIAGNOSIS — H539 Unspecified visual disturbance: Secondary | ICD-10-CM | POA: Diagnosis not present

## 2019-01-15 DIAGNOSIS — E781 Pure hyperglyceridemia: Secondary | ICD-10-CM | POA: Diagnosis not present

## 2019-01-27 DIAGNOSIS — E1165 Type 2 diabetes mellitus with hyperglycemia: Secondary | ICD-10-CM | POA: Diagnosis not present

## 2019-02-05 DIAGNOSIS — C50812 Malignant neoplasm of overlapping sites of left female breast: Secondary | ICD-10-CM | POA: Diagnosis not present

## 2019-02-05 DIAGNOSIS — Z853 Personal history of malignant neoplasm of breast: Secondary | ICD-10-CM | POA: Diagnosis not present

## 2019-02-05 DIAGNOSIS — Z9013 Acquired absence of bilateral breasts and nipples: Secondary | ICD-10-CM | POA: Diagnosis not present

## 2019-02-05 DIAGNOSIS — Z170 Estrogen receptor positive status [ER+]: Secondary | ICD-10-CM | POA: Diagnosis not present

## 2019-02-17 DIAGNOSIS — N760 Acute vaginitis: Secondary | ICD-10-CM | POA: Diagnosis not present

## 2019-02-17 DIAGNOSIS — E1140 Type 2 diabetes mellitus with diabetic neuropathy, unspecified: Secondary | ICD-10-CM | POA: Diagnosis not present

## 2019-02-24 DIAGNOSIS — E1100 Type 2 diabetes mellitus with hyperosmolarity without nonketotic hyperglycemic-hyperosmolar coma (NKHHC): Secondary | ICD-10-CM | POA: Diagnosis not present

## 2019-02-24 DIAGNOSIS — Z903 Acquired absence of stomach [part of]: Secondary | ICD-10-CM | POA: Diagnosis not present

## 2019-02-24 DIAGNOSIS — Z794 Long term (current) use of insulin: Secondary | ICD-10-CM | POA: Diagnosis not present

## 2019-03-02 ENCOUNTER — Encounter (HOSPITAL_COMMUNITY)
Admission: EM | Disposition: A | Discharge status: Rehabilitation Facility | Payer: Self-pay | Source: Home / Self Care | Attending: Internal Medicine

## 2019-03-02 ENCOUNTER — Inpatient Hospital Stay (HOSPITAL_COMMUNITY): Payer: Medicare Other

## 2019-03-02 ENCOUNTER — Emergency Department (HOSPITAL_COMMUNITY): Payer: Medicare Other

## 2019-03-02 ENCOUNTER — Inpatient Hospital Stay (HOSPITAL_COMMUNITY): Payer: Medicare Other | Admitting: Anesthesiology

## 2019-03-02 ENCOUNTER — Inpatient Hospital Stay (HOSPITAL_COMMUNITY)
Admission: EM | Admit: 2019-03-02 | Discharge: 2019-03-09 | DRG: 023 | Disposition: A | Discharge status: Rehabilitation Facility | Payer: Medicare Other | Attending: Internal Medicine | Admitting: Internal Medicine

## 2019-03-02 ENCOUNTER — Other Ambulatory Visit: Payer: Self-pay

## 2019-03-02 ENCOUNTER — Encounter (HOSPITAL_COMMUNITY): Payer: Self-pay | Admitting: *Deleted

## 2019-03-02 DIAGNOSIS — R29712 NIHSS score 12: Secondary | ICD-10-CM | POA: Diagnosis not present

## 2019-03-02 DIAGNOSIS — Z888 Allergy status to other drugs, medicaments and biological substances status: Secondary | ICD-10-CM | POA: Diagnosis not present

## 2019-03-02 DIAGNOSIS — E872 Acidosis: Secondary | ICD-10-CM | POA: Diagnosis present

## 2019-03-02 DIAGNOSIS — Z9013 Acquired absence of bilateral breasts and nipples: Secondary | ICD-10-CM | POA: Diagnosis not present

## 2019-03-02 DIAGNOSIS — G8194 Hemiplegia, unspecified affecting left nondominant side: Secondary | ICD-10-CM | POA: Diagnosis not present

## 2019-03-02 DIAGNOSIS — I9581 Postprocedural hypotension: Secondary | ICD-10-CM

## 2019-03-02 DIAGNOSIS — Z8249 Family history of ischemic heart disease and other diseases of the circulatory system: Secondary | ICD-10-CM

## 2019-03-02 DIAGNOSIS — E1165 Type 2 diabetes mellitus with hyperglycemia: Secondary | ICD-10-CM | POA: Diagnosis present

## 2019-03-02 DIAGNOSIS — R52 Pain, unspecified: Secondary | ICD-10-CM | POA: Diagnosis not present

## 2019-03-02 DIAGNOSIS — J449 Chronic obstructive pulmonary disease, unspecified: Secondary | ICD-10-CM | POA: Diagnosis not present

## 2019-03-02 DIAGNOSIS — I69319 Unspecified symptoms and signs involving cognitive functions following cerebral infarction: Secondary | ICD-10-CM | POA: Diagnosis not present

## 2019-03-02 DIAGNOSIS — I69391 Dysphagia following cerebral infarction: Secondary | ICD-10-CM | POA: Diagnosis not present

## 2019-03-02 DIAGNOSIS — Z853 Personal history of malignant neoplasm of breast: Secondary | ICD-10-CM

## 2019-03-02 DIAGNOSIS — Z743 Need for continuous supervision: Secondary | ICD-10-CM | POA: Diagnosis not present

## 2019-03-02 DIAGNOSIS — Z720 Tobacco use: Secondary | ICD-10-CM

## 2019-03-02 DIAGNOSIS — I6611 Occlusion and stenosis of right anterior cerebral artery: Secondary | ICD-10-CM | POA: Diagnosis present

## 2019-03-02 DIAGNOSIS — R0682 Tachypnea, not elsewhere classified: Secondary | ICD-10-CM

## 2019-03-02 DIAGNOSIS — D72829 Elevated white blood cell count, unspecified: Secondary | ICD-10-CM | POA: Diagnosis present

## 2019-03-02 DIAGNOSIS — Z7982 Long term (current) use of aspirin: Secondary | ICD-10-CM | POA: Diagnosis not present

## 2019-03-02 DIAGNOSIS — Z79899 Other long term (current) drug therapy: Secondary | ICD-10-CM

## 2019-03-02 DIAGNOSIS — Z20828 Contact with and (suspected) exposure to other viral communicable diseases: Secondary | ICD-10-CM | POA: Diagnosis not present

## 2019-03-02 DIAGNOSIS — Z6832 Body mass index (BMI) 32.0-32.9, adult: Secondary | ICD-10-CM

## 2019-03-02 DIAGNOSIS — R1310 Dysphagia, unspecified: Secondary | ICD-10-CM | POA: Diagnosis present

## 2019-03-02 DIAGNOSIS — E041 Nontoxic single thyroid nodule: Secondary | ICD-10-CM | POA: Diagnosis not present

## 2019-03-02 DIAGNOSIS — R4189 Other symptoms and signs involving cognitive functions and awareness: Secondary | ICD-10-CM | POA: Diagnosis not present

## 2019-03-02 DIAGNOSIS — Z4682 Encounter for fitting and adjustment of non-vascular catheter: Secondary | ICD-10-CM | POA: Diagnosis not present

## 2019-03-02 DIAGNOSIS — I63511 Cerebral infarction due to unspecified occlusion or stenosis of right middle cerebral artery: Secondary | ICD-10-CM

## 2019-03-02 DIAGNOSIS — Z882 Allergy status to sulfonamides status: Secondary | ICD-10-CM | POA: Diagnosis not present

## 2019-03-02 DIAGNOSIS — G9349 Other encephalopathy: Secondary | ICD-10-CM | POA: Diagnosis present

## 2019-03-02 DIAGNOSIS — F17210 Nicotine dependence, cigarettes, uncomplicated: Secondary | ICD-10-CM | POA: Diagnosis present

## 2019-03-02 DIAGNOSIS — Z880 Allergy status to penicillin: Secondary | ICD-10-CM

## 2019-03-02 DIAGNOSIS — E1169 Type 2 diabetes mellitus with other specified complication: Secondary | ICD-10-CM

## 2019-03-02 DIAGNOSIS — I69392 Facial weakness following cerebral infarction: Secondary | ICD-10-CM | POA: Diagnosis not present

## 2019-03-02 DIAGNOSIS — Z9221 Personal history of antineoplastic chemotherapy: Secondary | ICD-10-CM | POA: Diagnosis not present

## 2019-03-02 DIAGNOSIS — G43909 Migraine, unspecified, not intractable, without status migrainosus: Secondary | ICD-10-CM | POA: Diagnosis not present

## 2019-03-02 DIAGNOSIS — I6521 Occlusion and stenosis of right carotid artery: Secondary | ICD-10-CM | POA: Diagnosis present

## 2019-03-02 DIAGNOSIS — F418 Other specified anxiety disorders: Secondary | ICD-10-CM | POA: Diagnosis present

## 2019-03-02 DIAGNOSIS — G936 Cerebral edema: Secondary | ICD-10-CM | POA: Diagnosis present

## 2019-03-02 DIAGNOSIS — I63411 Cerebral infarction due to embolism of right middle cerebral artery: Principal | ICD-10-CM | POA: Diagnosis present

## 2019-03-02 DIAGNOSIS — I959 Hypotension, unspecified: Secondary | ICD-10-CM | POA: Diagnosis present

## 2019-03-02 DIAGNOSIS — F5105 Insomnia due to other mental disorder: Secondary | ICD-10-CM | POA: Diagnosis not present

## 2019-03-02 DIAGNOSIS — E785 Hyperlipidemia, unspecified: Secondary | ICD-10-CM | POA: Diagnosis present

## 2019-03-02 DIAGNOSIS — Z4659 Encounter for fitting and adjustment of other gastrointestinal appliance and device: Secondary | ICD-10-CM

## 2019-03-02 DIAGNOSIS — I6601 Occlusion and stenosis of right middle cerebral artery: Secondary | ICD-10-CM | POA: Diagnosis not present

## 2019-03-02 DIAGNOSIS — Z794 Long term (current) use of insulin: Secondary | ICD-10-CM

## 2019-03-02 DIAGNOSIS — Z885 Allergy status to narcotic agent status: Secondary | ICD-10-CM | POA: Diagnosis not present

## 2019-03-02 DIAGNOSIS — I63231 Cerebral infarction due to unspecified occlusion or stenosis of right carotid arteries: Secondary | ICD-10-CM | POA: Diagnosis not present

## 2019-03-02 DIAGNOSIS — I10 Essential (primary) hypertension: Secondary | ICD-10-CM | POA: Diagnosis not present

## 2019-03-02 DIAGNOSIS — E876 Hypokalemia: Secondary | ICD-10-CM

## 2019-03-02 DIAGNOSIS — R001 Bradycardia, unspecified: Secondary | ICD-10-CM | POA: Diagnosis not present

## 2019-03-02 DIAGNOSIS — R29818 Other symptoms and signs involving the nervous system: Secondary | ICD-10-CM | POA: Diagnosis not present

## 2019-03-02 DIAGNOSIS — M7989 Other specified soft tissue disorders: Secondary | ICD-10-CM | POA: Diagnosis not present

## 2019-03-02 DIAGNOSIS — R414 Neurologic neglect syndrome: Secondary | ICD-10-CM | POA: Diagnosis present

## 2019-03-02 DIAGNOSIS — Z9884 Bariatric surgery status: Secondary | ICD-10-CM

## 2019-03-02 DIAGNOSIS — R6510 Systemic inflammatory response syndrome (SIRS) of non-infectious origin without acute organ dysfunction: Secondary | ICD-10-CM

## 2019-03-02 DIAGNOSIS — F39 Unspecified mood [affective] disorder: Secondary | ICD-10-CM | POA: Diagnosis not present

## 2019-03-02 DIAGNOSIS — G8929 Other chronic pain: Secondary | ICD-10-CM | POA: Diagnosis not present

## 2019-03-02 DIAGNOSIS — E669 Obesity, unspecified: Secondary | ICD-10-CM | POA: Diagnosis present

## 2019-03-02 DIAGNOSIS — Z803 Family history of malignant neoplasm of breast: Secondary | ICD-10-CM

## 2019-03-02 DIAGNOSIS — F4001 Agoraphobia with panic disorder: Secondary | ICD-10-CM | POA: Diagnosis not present

## 2019-03-02 DIAGNOSIS — Z881 Allergy status to other antibiotic agents status: Secondary | ICD-10-CM | POA: Diagnosis not present

## 2019-03-02 DIAGNOSIS — I6389 Other cerebral infarction: Secondary | ICD-10-CM | POA: Diagnosis not present

## 2019-03-02 DIAGNOSIS — G4489 Other headache syndrome: Secondary | ICD-10-CM | POA: Diagnosis not present

## 2019-03-02 DIAGNOSIS — R29810 Facial weakness: Secondary | ICD-10-CM | POA: Diagnosis present

## 2019-03-02 DIAGNOSIS — I69354 Hemiplegia and hemiparesis following cerebral infarction affecting left non-dominant side: Secondary | ICD-10-CM | POA: Diagnosis not present

## 2019-03-02 DIAGNOSIS — K219 Gastro-esophageal reflux disease without esophagitis: Secondary | ICD-10-CM | POA: Diagnosis present

## 2019-03-02 DIAGNOSIS — G3184 Mild cognitive impairment, so stated: Secondary | ICD-10-CM | POA: Diagnosis not present

## 2019-03-02 DIAGNOSIS — I671 Cerebral aneurysm, nonruptured: Secondary | ICD-10-CM | POA: Diagnosis not present

## 2019-03-02 DIAGNOSIS — R45851 Suicidal ideations: Secondary | ICD-10-CM | POA: Diagnosis present

## 2019-03-02 DIAGNOSIS — G441 Vascular headache, not elsewhere classified: Secondary | ICD-10-CM | POA: Diagnosis not present

## 2019-03-02 DIAGNOSIS — Z818 Family history of other mental and behavioral disorders: Secondary | ICD-10-CM | POA: Diagnosis not present

## 2019-03-02 DIAGNOSIS — Z800 Family history of malignant neoplasm of digestive organs: Secondary | ICD-10-CM

## 2019-03-02 DIAGNOSIS — F331 Major depressive disorder, recurrent, moderate: Secondary | ICD-10-CM | POA: Diagnosis present

## 2019-03-02 DIAGNOSIS — I63031 Cerebral infarction due to thrombosis of right carotid artery: Secondary | ICD-10-CM | POA: Diagnosis not present

## 2019-03-02 DIAGNOSIS — R7309 Other abnormal glucose: Secondary | ICD-10-CM | POA: Diagnosis not present

## 2019-03-02 DIAGNOSIS — I639 Cerebral infarction, unspecified: Secondary | ICD-10-CM | POA: Diagnosis present

## 2019-03-02 DIAGNOSIS — R1311 Dysphagia, oral phase: Secondary | ICD-10-CM | POA: Diagnosis not present

## 2019-03-02 HISTORY — PX: IR PERCUTANEOUS ART THROMBECTOMY/INFUSION INTRACRANIAL INC DIAG ANGIO: IMG6087

## 2019-03-02 HISTORY — PX: IR CT HEAD LTD: IMG2386

## 2019-03-02 HISTORY — PX: IR ANGIO VERTEBRAL SEL SUBCLAVIAN INNOMINATE UNI R MOD SED: IMG5365

## 2019-03-02 HISTORY — PX: IR INTRAVSC STENT CERV CAROTID W/O EMB-PROT MOD SED INC ANGIO: IMG2304

## 2019-03-02 HISTORY — PX: RADIOLOGY WITH ANESTHESIA: SHX6223

## 2019-03-02 LAB — GLUCOSE, CAPILLARY
Glucose-Capillary: 288 mg/dL — ABNORMAL HIGH (ref 70–99)
Glucose-Capillary: 188 mg/dL — ABNORMAL HIGH (ref 70–99)
Glucose-Capillary: 181 mg/dL — ABNORMAL HIGH (ref 70–99)

## 2019-03-02 LAB — BASIC METABOLIC PANEL
Anion gap: 10 (ref 5–15)
BUN: 6 mg/dL (ref 6–20)
CO2: 18 mmol/L — ABNORMAL LOW (ref 22–32)
Calcium: 7.9 mg/dL — ABNORMAL LOW (ref 8.9–10.3)
Chloride: 109 mmol/L (ref 98–111)
Creatinine, Ser: 0.64 mg/dL (ref 0.44–1.00)
GFR calc Af Amer: >60 mL/min (ref >60)
GFR calc non Af Amer: >60 mL/min (ref >60)
Glucose, Bld: 255 mg/dL — ABNORMAL HIGH (ref 70–99)
Potassium: 4 mmol/L (ref 3.5–5.1)
Sodium: 137 mmol/L (ref 135–145)

## 2019-03-02 LAB — I-STAT CHEM 8, ED
BUN: 8 mg/dL (ref 6–20)
Calcium, Ion: 1.11 mmol/L — ABNORMAL LOW (ref 1.15–1.40)
Chloride: 103 mmol/L (ref 98–111)
Creatinine, Ser: 0.5 mg/dL (ref 0.44–1.00)
Glucose, Bld: 247 mg/dL — ABNORMAL HIGH (ref 70–99)
HCT: 45 % (ref 36.0–46.0)
Hemoglobin: 15.3 g/dL — ABNORMAL HIGH (ref 12.0–15.0)
Potassium: 4.1 mmol/L (ref 3.5–5.1)
Sodium: 136 mmol/L (ref 135–145)
TCO2: 24 mmol/L (ref 22–32)

## 2019-03-02 LAB — DIFFERENTIAL
Abs Immature Granulocytes: 0.08 10*3/uL — ABNORMAL HIGH (ref 0.00–0.07)
Basophils Absolute: 0.1 10*3/uL (ref 0.0–0.1)
Basophils Relative: 1 %
Eosinophils Absolute: 0 10*3/uL (ref 0.0–0.5)
Eosinophils Relative: 0 %
Immature Granulocytes: 1 %
Lymphocytes Relative: 10 %
Lymphs Abs: 1.7 10*3/uL (ref 0.7–4.0)
Monocytes Absolute: 0.9 10*3/uL (ref 0.1–1.0)
Monocytes Relative: 6 %
Neutro Abs: 14 10*3/uL — ABNORMAL HIGH (ref 1.7–7.7)
Neutrophils Relative %: 82 %

## 2019-03-02 LAB — CK TOTAL AND CKMB (NOT AT ARMC)
CK, MB: 1.8 ng/mL (ref 0.5–5.0)
Relative Index: INVALID (ref 0.0–2.5)
Total CK: 65 U/L (ref 38–234)

## 2019-03-02 LAB — CBG MONITORING, ED: Glucose-Capillary: 239 mg/dL — ABNORMAL HIGH (ref 70–99)

## 2019-03-02 LAB — POCT I-STAT 7, (LYTES, BLD GAS, ICA,H+H)
Acid-base deficit: 8 mmol/L — ABNORMAL HIGH (ref 0.0–2.0)
Bicarbonate: 18.9 mmol/L — ABNORMAL LOW (ref 20.0–28.0)
Calcium, Ion: 1.13 mmol/L — ABNORMAL LOW (ref 1.15–1.40)
HCT: 40 % (ref 36.0–46.0)
Hemoglobin: 13.6 g/dL (ref 12.0–15.0)
O2 Saturation: 100 %
Patient temperature: 97.7
Potassium: 3.9 mmol/L (ref 3.5–5.1)
Sodium: 138 mmol/L (ref 135–145)
TCO2: 20 mmol/L — ABNORMAL LOW (ref 22–32)
pCO2 arterial: 39.9 mmHg (ref 32.0–48.0)
pH, Arterial: 7.282 — ABNORMAL LOW (ref 7.350–7.450)
pO2, Arterial: 216 mmHg — ABNORMAL HIGH (ref 83.0–108.0)

## 2019-03-02 LAB — URINALYSIS, ROUTINE W REFLEX MICROSCOPIC
Bilirubin Urine: NEGATIVE
Glucose, UA: >=500 mg/dL — AB
Hgb urine dipstick: NEGATIVE
Ketones, ur: 5 mg/dL — AB
Nitrite: NEGATIVE
Protein, ur: NEGATIVE mg/dL
Specific Gravity, Urine: >1.046 — ABNORMAL HIGH (ref 1.005–1.030)
pH: 5 (ref 5.0–8.0)

## 2019-03-02 LAB — COMPREHENSIVE METABOLIC PANEL
ALT: 17 U/L (ref 0–44)
AST: 8 U/L — ABNORMAL LOW (ref 15–41)
Albumin: 3.6 g/dL (ref 3.5–5.0)
Alkaline Phosphatase: 50 U/L (ref 38–126)
Anion gap: 9 (ref 5–15)
BUN: 8 mg/dL (ref 6–20)
CO2: 23 mmol/L (ref 22–32)
Calcium: 8.8 mg/dL — ABNORMAL LOW (ref 8.9–10.3)
Chloride: 103 mmol/L (ref 98–111)
Creatinine, Ser: 0.53 mg/dL (ref 0.44–1.00)
GFR calc Af Amer: >60 mL/min (ref >60)
GFR calc non Af Amer: >60 mL/min (ref >60)
Glucose, Bld: 250 mg/dL — ABNORMAL HIGH (ref 70–99)
Potassium: 4.1 mmol/L (ref 3.5–5.1)
Sodium: 135 mmol/L (ref 135–145)
Total Bilirubin: 0.7 mg/dL (ref 0.3–1.2)
Total Protein: 6.7 g/dL (ref 6.5–8.1)

## 2019-03-02 LAB — CBC
HCT: 44.6 % (ref 36.0–46.0)
HCT: 41.4 % (ref 36.0–46.0)
Hemoglobin: 14.9 g/dL (ref 12.0–15.0)
Hemoglobin: 14.2 g/dL (ref 12.0–15.0)
MCH: 29.7 pg (ref 26.0–34.0)
MCH: 30 pg (ref 26.0–34.0)
MCHC: 33.4 g/dL (ref 30.0–36.0)
MCHC: 34.3 g/dL (ref 30.0–36.0)
MCV: 89 fL (ref 80.0–100.0)
MCV: 87.3 fL (ref 80.0–100.0)
Platelets: 235 10*3/uL (ref 150–400)
Platelets: 289 10*3/uL (ref 150–400)
RBC: 5.01 MIL/uL (ref 3.87–5.11)
RBC: 4.74 MIL/uL (ref 3.87–5.11)
RDW: 13.2 % (ref 11.5–15.5)
RDW: 13.4 % (ref 11.5–15.5)
WBC: 16.9 10*3/uL — ABNORMAL HIGH (ref 4.0–10.5)
WBC: 22.4 10*3/uL — ABNORMAL HIGH (ref 4.0–10.5)
nRBC: 0 % (ref 0.0–0.2)
nRBC: 0 % (ref 0.0–0.2)

## 2019-03-02 LAB — POC SARS CORONAVIRUS 2 AG -  ED: SARS Coronavirus 2 Ag: NEGATIVE

## 2019-03-02 LAB — SARS CORONAVIRUS 2 BY RT PCR (HOSPITAL ORDER, PERFORMED IN ~~LOC~~ HOSPITAL LAB): SARS Coronavirus 2: NEGATIVE

## 2019-03-02 LAB — ETHANOL: Alcohol, Ethyl (B): <10 mg/dL (ref <10)

## 2019-03-02 LAB — RAPID URINE DRUG SCREEN, HOSP PERFORMED
Amphetamines: POSITIVE — AB
Barbiturates: NOT DETECTED
Benzodiazepines: NOT DETECTED
Cocaine: NOT DETECTED
Opiates: POSITIVE — AB
Tetrahydrocannabinol: NOT DETECTED

## 2019-03-02 LAB — MRSA PCR SCREENING: MRSA by PCR: NEGATIVE

## 2019-03-02 LAB — PROTIME-INR
INR: 1 (ref 0.8–1.2)
Prothrombin Time: 12.6 seconds (ref 11.4–15.2)

## 2019-03-02 LAB — HEMOGLOBIN A1C
Hgb A1c MFr Bld: 9.2 % — ABNORMAL HIGH (ref 4.8–5.6)
Mean Plasma Glucose: 217.34 mg/dL

## 2019-03-02 LAB — I-STAT BETA HCG BLOOD, ED (MC, WL, AP ONLY): I-stat hCG, quantitative: <5 m[IU]/mL (ref <5)

## 2019-03-02 LAB — LACTIC ACID, PLASMA: Lactic Acid, Venous: 3.4 mmol/L (ref 0.5–1.9)

## 2019-03-02 LAB — APTT: aPTT: 27 seconds (ref 24–36)

## 2019-03-02 LAB — HIV ANTIBODY (ROUTINE TESTING W REFLEX): HIV Screen 4th Generation wRfx: NONREACTIVE

## 2019-03-02 SURGERY — IR WITH ANESTHESIA
Anesthesia: General

## 2019-03-02 MED ORDER — NITROGLYCERIN 1 MG/10 ML FOR IR/CATH LAB
INTRA_ARTERIAL | Status: AC
  Filled 2019-03-02: qty 10

## 2019-03-02 MED ORDER — GLYCOPYRROLATE 0.2 MG/ML IJ SOLN
INTRAMUSCULAR | Status: DC | PRN
  Administered 2019-03-02: 0.2 mg via INTRAVENOUS

## 2019-03-02 MED ORDER — VANCOMYCIN HCL 1000 MG IV SOLR
INTRAVENOUS | Status: DC | PRN
  Administered 2019-03-02: 1000 mg via INTRAVENOUS

## 2019-03-02 MED ORDER — ACETAMINOPHEN 325 MG PO TABS: 650.0000 mg | ORAL_TABLET | ORAL | Status: DC | PRN

## 2019-03-02 MED ORDER — FENTANYL CITRATE (PF) 100 MCG/2ML IJ SOLN
INTRAMUSCULAR | Status: DC | PRN
  Administered 2019-03-02 (×2): 50 ug via INTRAVENOUS

## 2019-03-02 MED ORDER — INSULIN ASPART 100 UNIT/ML ~~LOC~~ SOLN
0.0000 [IU] | SUBCUTANEOUS | Status: DC
  Administered 2019-03-02 (×2): 3 [IU] via SUBCUTANEOUS
  Administered 2019-03-02: 8 [IU] via SUBCUTANEOUS
  Administered 2019-03-03 (×3): 3 [IU] via SUBCUTANEOUS
  Administered 2019-03-03 – 2019-03-04 (×3): 2 [IU] via SUBCUTANEOUS
  Administered 2019-03-04 (×2): 5 [IU] via SUBCUTANEOUS
  Administered 2019-03-04: 3 [IU] via SUBCUTANEOUS
  Administered 2019-03-04: 2 [IU] via SUBCUTANEOUS
  Administered 2019-03-04: 5 [IU] via SUBCUTANEOUS
  Administered 2019-03-05 (×2): 2 [IU] via SUBCUTANEOUS
  Administered 2019-03-05 (×2): 3 [IU] via SUBCUTANEOUS
  Administered 2019-03-05: 2 [IU] via SUBCUTANEOUS
  Administered 2019-03-06 – 2019-03-08 (×8): 3 [IU] via SUBCUTANEOUS
  Administered 2019-03-08 – 2019-03-09 (×3): 2 [IU] via SUBCUTANEOUS

## 2019-03-02 MED ORDER — INSULIN ASPART 100 UNIT/ML ~~LOC~~ SOLN
SUBCUTANEOUS | Status: AC
  Filled 2019-03-02: qty 1

## 2019-03-02 MED ORDER — LIDOCAINE 2% (20 MG/ML) 5 ML SYRINGE
INTRAMUSCULAR | Status: DC | PRN
  Administered 2019-03-02: 40 mg via INTRAVENOUS

## 2019-03-02 MED ORDER — ASPIRIN 81 MG PO CHEW
81.0000 mg | CHEWABLE_TABLET | Freq: Every day | ORAL | Status: DC
  Administered 2019-03-05 – 2019-03-09 (×5): 81 mg via ORAL
  Filled 2019-03-02 (×5): qty 1

## 2019-03-02 MED ORDER — ASPIRIN 300 MG RE SUPP: 300.0000 mg | Freq: Every day | RECTAL | Status: DC

## 2019-03-02 MED ORDER — ONDANSETRON HCL 4 MG/2ML IJ SOLN
4.0000 mg | Freq: Four times a day (QID) | INTRAMUSCULAR | Status: DC | PRN
  Administered 2019-03-03: 4 mg via INTRAVENOUS
  Filled 2019-03-02: qty 2

## 2019-03-02 MED ORDER — EPTIFIBATIDE 20 MG/10ML IV SOLN
INTRAVENOUS | Status: AC | PRN
  Administered 2019-03-02 (×5): 1.5 mg via INTRAVENOUS

## 2019-03-02 MED ORDER — INSULIN ASPART 100 UNIT/ML ~~LOC~~ SOLN
SUBCUTANEOUS | Status: DC | PRN
  Administered 2019-03-02: 10 [IU] via SUBCUTANEOUS

## 2019-03-02 MED ORDER — PROPOFOL 10 MG/ML IV BOLUS
INTRAVENOUS | Status: DC | PRN
  Administered 2019-03-02: 150 mg via INTRAVENOUS

## 2019-03-02 MED ORDER — SODIUM CHLORIDE 0.9 % IV SOLN
INTRAVENOUS | Status: DC
  Administered 2019-03-02 – 2019-03-03 (×4): via INTRAVENOUS

## 2019-03-02 MED ORDER — PHENYLEPHRINE HCL-NACL 10-0.9 MG/250ML-% IV SOLN
25.0000 ug/min | INTRAVENOUS | Status: DC
  Filled 2019-03-02 (×2): qty 250

## 2019-03-02 MED ORDER — TIROFIBAN HCL 3.75 MG/15ML IV CONC
INTRAVENOUS | Status: AC | PRN
  Administered 2019-03-02 (×2): 1000 ug via INTRA_ARTERIAL

## 2019-03-02 MED ORDER — TICAGRELOR 90 MG PO TABS
90.0000 mg | ORAL_TABLET | Freq: Two times a day (BID) | ORAL | Status: DC
  Administered 2019-03-03 – 2019-03-04 (×3): 90 mg
  Filled 2019-03-02 (×5): qty 1

## 2019-03-02 MED ORDER — ACETAMINOPHEN 160 MG/5ML PO SOLN: 650.0000 mg | ORAL | Status: DC | PRN

## 2019-03-02 MED ORDER — VANCOMYCIN HCL IN DEXTROSE 1-5 GM/200ML-% IV SOLN
INTRAVENOUS | Status: AC
  Filled 2019-03-02: qty 200

## 2019-03-02 MED ORDER — STROKE: EARLY STAGES OF RECOVERY BOOK
Freq: Once | Status: DC
  Filled 2019-03-02: qty 1

## 2019-03-02 MED ORDER — CHLORHEXIDINE GLUCONATE CLOTH 2 % EX PADS
6.0000 | MEDICATED_PAD | Freq: Every day | CUTANEOUS | Status: DC
  Administered 2019-03-02 – 2019-03-09 (×5): 6 via TOPICAL

## 2019-03-02 MED ORDER — TICAGRELOR 60 MG PO TABS
ORAL_TABLET | ORAL | Status: AC | PRN
  Administered 2019-03-02: 180 mg

## 2019-03-02 MED ORDER — ASPIRIN 81 MG PO CHEW
81.0000 mg | CHEWABLE_TABLET | Freq: Every day | ORAL | Status: DC
  Administered 2019-03-03 – 2019-03-04 (×2): 81 mg
  Filled 2019-03-02 (×2): qty 1

## 2019-03-02 MED ORDER — TIROFIBAN HCL IN NACL 5-0.9 MG/100ML-% IV SOLN
INTRAVENOUS | Status: AC
  Filled 2019-03-02: qty 100

## 2019-03-02 MED ORDER — IOHEXOL 350 MG/ML SOLN
100.0000 mL | Freq: Once | INTRAVENOUS | Status: AC | PRN
  Administered 2019-03-02: 100 mL via INTRAVENOUS

## 2019-03-02 MED ORDER — PHENYLEPHRINE HCL-NACL 10-0.9 MG/250ML-% IV SOLN
INTRAVENOUS | Status: AC
  Filled 2019-03-02: qty 250

## 2019-03-02 MED ORDER — EPHEDRINE SULFATE 50 MG/ML IJ SOLN
INTRAMUSCULAR | Status: DC | PRN
  Administered 2019-03-02 (×4): 5 mg via INTRAVENOUS

## 2019-03-02 MED ORDER — IOHEXOL 300 MG/ML  SOLN
150.0000 mL | Freq: Once | INTRAMUSCULAR | Status: AC | PRN
  Administered 2019-03-02: 75 mL via INTRA_ARTERIAL

## 2019-03-02 MED ORDER — ASPIRIN 81 MG PO CHEW
CHEWABLE_TABLET | ORAL | Status: AC | PRN
  Administered 2019-03-02: 81 mg

## 2019-03-02 MED ORDER — EPTIFIBATIDE 20 MG/10ML IV SOLN
INTRAVENOUS | Status: AC
  Filled 2019-03-02: qty 10

## 2019-03-02 MED ORDER — IOHEXOL 300 MG/ML  SOLN
150.0000 mL | Freq: Once | INTRAMUSCULAR | Status: AC | PRN
  Administered 2019-03-02: 25 mL via INTRA_ARTERIAL

## 2019-03-02 MED ORDER — SUCCINYLCHOLINE CHLORIDE 20 MG/ML IJ SOLN
INTRAMUSCULAR | Status: DC | PRN
  Administered 2019-03-02: 120 mg via INTRAVENOUS

## 2019-03-02 MED ORDER — PHENYLEPHRINE HCL-NACL 10-0.9 MG/250ML-% IV SOLN
0.0000 ug/min | INTRAVENOUS | Status: DC
  Administered 2019-03-02: 20 ug/min via INTRAVENOUS

## 2019-03-02 MED ORDER — PHENYLEPHRINE HCL (PRESSORS) 10 MG/ML IV SOLN
INTRAVENOUS | Status: DC | PRN
  Administered 2019-03-02: 40 ug via INTRAVENOUS
  Administered 2019-03-02: 80 ug via INTRAVENOUS
  Administered 2019-03-02: 40 ug via INTRAVENOUS
  Administered 2019-03-02: 80 ug via INTRAVENOUS
  Administered 2019-03-02: 40 ug via INTRAVENOUS
  Administered 2019-03-02: 80 ug via INTRAVENOUS

## 2019-03-02 MED ORDER — ACETAMINOPHEN 325 MG PO TABS
650.0000 mg | ORAL_TABLET | ORAL | Status: DC | PRN
  Administered 2019-03-05 – 2019-03-08 (×4): 650 mg via ORAL
  Filled 2019-03-02 (×4): qty 2

## 2019-03-02 MED ORDER — DEXAMETHASONE SODIUM PHOSPHATE 10 MG/ML IJ SOLN
INTRAMUSCULAR | Status: DC | PRN
  Administered 2019-03-02: 10 mg via INTRAVENOUS

## 2019-03-02 MED ORDER — CLEVIDIPINE BUTYRATE 0.5 MG/ML IV EMUL
0.0000 mg/h | INTRAVENOUS | Status: AC
  Filled 2019-03-02: qty 50

## 2019-03-02 MED ORDER — CLOPIDOGREL BISULFATE 300 MG PO TABS
ORAL_TABLET | ORAL | Status: AC
  Filled 2019-03-02: qty 1

## 2019-03-02 MED ORDER — TICAGRELOR 90 MG PO TABS
ORAL_TABLET | ORAL | Status: AC
  Filled 2019-03-02: qty 2

## 2019-03-02 MED ORDER — NITROGLYCERIN 1 MG/10 ML FOR IR/CATH LAB
INTRA_ARTERIAL | Status: AC | PRN
  Administered 2019-03-02: 25 ug via INTRA_ARTERIAL

## 2019-03-02 MED ORDER — SODIUM CHLORIDE 0.9 % IV SOLN: 250.0000 mL | INTRAVENOUS | Status: DC

## 2019-03-02 MED ORDER — ACETAMINOPHEN 650 MG RE SUPP: 650.0000 mg | RECTAL | Status: DC | PRN

## 2019-03-02 MED ORDER — TICAGRELOR 90 MG PO TABS
90.0000 mg | ORAL_TABLET | Freq: Two times a day (BID) | ORAL | Status: DC
  Administered 2019-03-04 – 2019-03-09 (×10): 90 mg via ORAL
  Filled 2019-03-02 (×10): qty 1

## 2019-03-02 MED ORDER — PHENYLEPHRINE HCL-NACL 10-0.9 MG/250ML-% IV SOLN
INTRAVENOUS | Status: DC | PRN
  Administered 2019-03-02: 25 ug/min via INTRAVENOUS

## 2019-03-02 MED ORDER — ROCURONIUM BROMIDE 50 MG/5ML IV SOSY
PREFILLED_SYRINGE | INTRAVENOUS | Status: DC | PRN
  Administered 2019-03-02: 50 mg via INTRAVENOUS
  Administered 2019-03-02: 20 mg via INTRAVENOUS

## 2019-03-02 MED ORDER — GLYCOPYRROLATE PF 0.2 MG/ML IJ SOSY
PREFILLED_SYRINGE | INTRAMUSCULAR | Status: DC | PRN
  Administered 2019-03-02: .2 mg via INTRAVENOUS

## 2019-03-02 MED ORDER — ASPIRIN 325 MG PO TABS: 325.0000 mg | ORAL_TABLET | Freq: Every day | ORAL | Status: DC

## 2019-03-02 MED ORDER — ACETAMINOPHEN 160 MG/5ML PO SOLN
650.0000 mg | ORAL | Status: DC | PRN
  Administered 2019-03-03 – 2019-03-05 (×5): 650 mg
  Filled 2019-03-02 (×5): qty 20.3

## 2019-03-02 MED ORDER — ASPIRIN 81 MG PO CHEW
CHEWABLE_TABLET | ORAL | Status: AC
  Filled 2019-03-02: qty 1

## 2019-03-02 MED ORDER — SUGAMMADEX SODIUM 500 MG/5ML IV SOLN
INTRAVENOUS | Status: DC | PRN
  Administered 2019-03-02: 300 mg via INTRAVENOUS

## 2019-03-02 MED ORDER — DULOXETINE HCL 60 MG PO CPEP
60.0000 mg | ORAL_CAPSULE | Freq: Every day | ORAL | Status: DC
  Administered 2019-03-05 – 2019-03-09 (×5): 60 mg via ORAL
  Filled 2019-03-02 (×6): qty 1

## 2019-03-02 MED ORDER — SODIUM CHLORIDE 0.9 % IV SOLN
INTRAVENOUS | Status: DC
  Administered 2019-03-05: 14:00:00 via INTRAVENOUS

## 2019-03-02 NOTE — Procedures (Signed)
S/P rt COMMON CAROTID ARTERIOGRAM,FOLLOWED BY COMPLETE REVASCULAR IZATION OF OCCLUDED  RT ICA TERMINUS,RT MCA AND RT ACA WITH X PASS WITH 4MMX 40 MM SOLITAIRE X fr RETRIEVER DEVICE AND PENUMBRA ASPIRATION ACHIEVING A tici 2c REVASCULARIZATION . S/P STENT ASSISTED ANGIOPLASTY OF SYMPTOTMATIC PROX RT ICA STENOSIS.Arlean Hopping md

## 2019-03-02 NOTE — ED Triage Notes (Addendum)
Patient presents to ed via [place] he was last seen by her at 2300 last pm states that's when she went to bed, she was c/o being light headed. Patient woke up at 0545 this am and walked to the bathroom, was able to walk to the bathroom sat on the commode and after voiding she has a terrible headache on the right side of her head and fell off the commode to the left  And wasn't able to get up. States she was able to pull herself to her room and call for help.

## 2019-03-02 NOTE — Sedation Documentation (Signed)
Right femoral sheath removed. 53fr Angioseal closure device used. Quick clot dressing applied.

## 2019-03-02 NOTE — Anesthesia Preprocedure Evaluation (Signed)
Anesthesia Evaluation  Patient identified by MRN, date of birth, ID band Patient awake    Reviewed: Allergy & Precautions, NPO status , Patient's Chart, lab work & pertinent test results  Airway Mallampati: II  TM Distance: >3 FB Neck ROM: Full    Dental  (+) Teeth Intact, Dental Advisory Given   Pulmonary Current Smoker,    breath sounds clear to auscultation       Cardiovascular  Rhythm:Regular Rate:Normal     Neuro/Psych    GI/Hepatic   Endo/Other  diabetes  Renal/GU      Musculoskeletal   Abdominal   Peds  Hematology   Anesthesia Other Findings   Reproductive/Obstetrics                             Anesthesia Physical Anesthesia Plan  ASA: IV and emergent  Anesthesia Plan: General   Post-op Pain Management:    Induction: Intravenous  PONV Risk Score and Plan: Ondansetron  Airway Management Planned: Oral ETT  Additional Equipment:   Intra-op Plan:   Post-operative Plan: Possible Post-op intubation/ventilation  Informed Consent: I have reviewed the patients History and Physical, chart, labs and discussed the procedure including the risks, benefits and alternatives for the proposed anesthesia with the patient or authorized representative who has indicated his/her understanding and acceptance.       Plan Discussed with: CRNA and Anesthesiologist  Anesthesia Plan Comments:         Anesthesia Quick Evaluation

## 2019-03-02 NOTE — Transfer of Care (Signed)
Immediate Anesthesia Transfer of Care Note  Patient: Ann Lopez  Procedure(s) Performed: IR WITH ANESTHESIA (N/A )  Patient Location: ICU  Anesthesia Type:General  Level of Consciousness: awake, alert  and oriented  Airway & Oxygen Therapy: Patient Spontanous Breathing and Patient connected to nasal cannula oxygen  Post-op Assessment: Report given to RN, Post -op Vital signs reviewed and stable and Patient moving all extremities X 4  Post vital signs: Reviewed and stable  Last Vitals:  Vitals Value Taken Time  BP 101/58 03/02/19 1222  Temp    Pulse 87 03/02/19 1226  Resp 23 03/02/19 1223  SpO2 98 % 03/02/19 1226  Vitals shown include unvalidated device data.  Last Pain: There were no vitals filed for this visit.       Complications: No apparent anesthesia complications

## 2019-03-02 NOTE — Progress Notes (Signed)
Inpatient Diabetes Program Recommendations  AACE/ADA: New Consensus Statement on Inpatient Glycemic Control (2015)  Target Ranges:  Prepandial:   less than 140 mg/dL      Peak postprandial:   less than 180 mg/dL (1-2 hours)      Critically ill patients:  140 - 180 mg/dL   Lab Results  Component Value Date   GLUCAP 239 (H) 03/02/2019   HGBA1C 9.2 (H) 03/02/2019    Review of Glycemic Control Results for Ann Lopez, Ann Lopez (MRN [id number]) as of 03/02/2019 13:59  Ref. Range 03/02/2019 08:20  Glucose-Capillary Latest Ref Range: 70 - 99 mg/dL 239 (H)   Diabetes history: DM 2 Outpatient Diabetes medications:  Lantus 20 units q HS Current orders for Inpatient glycemic control:  Novolog 0-15 units q 4 hours  Inpatient Diabetes Program Recommendations:    Please consider adding home dose of basal insulin, Lantus 20 units q HS.  Will follow.   Thanks  Adah Perl, RN, BC-ADM Inpatient Diabetes Coordinator Pager [phone] (8a-5p)

## 2019-03-02 NOTE — Progress Notes (Signed)
Patient ID: Ann Lopez, female   DOB: 11-01-1970, 48 y.o.   MRN: [id number] INR. Post treatment CT brain reveals contrast blush in the caudate and putamen nuclei. NO gross mass effect or ICH seen.. RT groin soft after manual compression.Distal pulses Palpable DPs and dopplerable PTs. Extubated .  S.Dylynn Ketner MD

## 2019-03-02 NOTE — Anesthesia Procedure Notes (Signed)
Procedure Name: Intubation Date/Time: 03/02/2019 9:00 AM Performed by: Neldon Newport, CRNA Pre-anesthesia Checklist: Timeout performed, Patient being monitored, Suction available, Emergency Drugs available and Patient identified Patient Re-evaluated:Patient Re-evaluated prior to induction Oxygen Delivery Method: Ambu bag Preoxygenation: Pre-oxygenation with 100% oxygen Induction Type: IV induction, Rapid sequence and Cricoid Pressure applied Laryngoscope Size: Mac and 3 Grade View: Grade I Tube type: Oral Tube size: 7.0 mm Number of attempts: 1 Placement Confirmation: breath sounds checked- equal and bilateral,  CO2 detector,  positive ETCO2 and ETT inserted through vocal cords under direct vision Secured at: 22 cm Tube secured with: Tape Dental Injury: Teeth and Oropharynx as per pre-operative assessment  Comments: Induction with intubation in isolation room in Radiology department. VSS.  SBP on induction 119/68  HR 79.

## 2019-03-02 NOTE — Consult Note (Addendum)
NAME:  Ann Lopez, MRN:  [id number], DOB:  Nov 27, 1970, LOS: 0 ADMISSION DATE:  03/02/2019, CONSULTATION DATE:  03/02/2019 REFERRING MD:  Roland Rack, MD CHIEF COMPLAINT:  Hypotension s/p R MCA embolectomy   Brief History   48 y/o female with PMH as below presented to ED 11/23 with left hemiparesis, left-sided neglect, and right gaze, NIHSS 12 --> IR MCA occlusion s/p embolectomy.  History of present illness   This 48 y/o female with PMH as below presented via EMS to the ED 11/23 am with left hemiparesis, left-sided neglect, and right gaze, NIHSS 12.  She was last known normal at 2300 11/22, awoke in am 11/23 and was able to ambulate to the bathroom but then could not stand from the toilet and fell to the ground.  She was taken to IR for emergent R MCA embolectomy.  When she arrived in 4N ICU, she was minimally responsive, exhibited seizure-like activity upon stimulation, and was hypotensive requiring pressor support.  Past Medical History  Anxiety/Depression, Asthma,Breast CA 2009 s/p bilat mastectomy Type 2 DM,HTN,HLD,Osteopenia,Gastric bypass 2013,GERD IBS Significant Hospital Events     Consults:  11/23 Neuro 11/23 PCCM  Procedures:  11/23 MCA embolectomy  Significant Diagnostic Tests:  11/23 CT Head w/o contrast> no hemorrhage 11/23 CTA/CTP >There is occlusive or near occlusive thrombus within the right ICA terminus with occlusive/near occlusive thrombus extending into the M1 right middle cerebral artery and A1 right anterior cerebral artery. There is some reconstitution of flow within proximal M2 and more distal right MCA branches. However, there are additional high-grade stenoses within proximal to mid right M2 branches, likely reflecting additional thrombus. 11/23 stat CT brain>>> 11/24 MRI brain>>> 11/23 EEG>>> Micro Data:  SARS-CoV-2 negative  Antimicrobials:     Interim history/subjective:  Unable to obtain.  Objective   Blood pressure 109/72, pulse  81, resp. rate (Abnormal) 23, height _0  (1.626 m), weight 83.3 kg, SpO2 98 %.        Intake/Output Summary (Last 24 hours) at 03/02/2019 1246 Last data filed at 03/02/2019 1226 Gross per 24 hour  Intake 3265 ml  Output 1950 ml  Net 1315 ml   Filed Weights   03/02/19 0800 03/02/19 0905  Weight: 83.3 kg 83.3 kg    Examination: General: Lying supine, on NRB mask, only responding to tactile stim at first but a little more responsive w/ time (responding to voice but still not following commands) HENT: PERRL, mucous membranes pink/moist. Soft collar in place  Lungs: Clear bilaterally. Occasionally snoring respirations; this improved w/ nasal airway  Cardiovascular: RRR, S1/S2, no MGR. Abdomen: Soft, nttp, +BS Extremities: Warm, dry, distal pulses 2+, R fem sheath dsg CDI, no hematoma Neuro: Noxious stimuli produces persistent right focal upper extremity non-rhythmic jerking, left foot almost appears to decorticate contract  GU: Clear yellow urine .  Resolved Hospital Problem list     Assessment & Plan:  Acute metabolic enecphalopathy after CVA - right M1 occlusion. Now s/p thrombectomy 11/23 R/o seizure Seizure-like activity with stimulation->appears more like myoclonus Plan Stat CT head Stat CBC Neuro checks q 1 hour EEG Echocardiogram (per the stroke team) SBP goal 120-140 per IR protocol  titration of peripheral neo for now BUT might be best to place CVL as IV access scarce  Tele Pending MRI brain without contrast when stable PT/OT/SLP when able  Hypotension. Etiology unclear. ? pharmacological from residual anesthesia. ? Neurogenic (seems unlikely) Plan Titrate neo for SBP 120 to 140 Hold home antihypertensives  Hold  diuretics Tele  Ck stat CBC; low threshold to image to r/o RPH  Non-anion gap Metabolic acidosis  Plan Repeat chemistry Ck lactic acid, also send total CKs Gentle hydration   HLD Plan Cont home tricor  Type 2 DM w/ hyperglycemia A1C 9.2  Plan SSI  GERD Plan Cont home prilosec  Hx anxiety/depression Plan Cont home duloxetine  Best practice:  Diet: NPO Pain/Anxiety/Delirium protocol (if indicated): NA VAP protocol (if indicated): NA  DVT prophylaxis: SCD GI prophylaxis: PPI Glucose control: ssi Mobility: BR Code Status: full code  Family Communication: pending  Disposition:  ICU  Labs   CBC: Recent Labs  Lab 03/02/19 0823 03/02/19 0826  WBC 16.9*  --   NEUTROABS 14.0*  --   HGB 14.9 15.3*  HCT 44.6 45.0  MCV 89.0  --   PLT 235  --     Basic Metabolic Panel: Recent Labs  Lab 03/02/19 0823 03/02/19 0826  NA 135 136  K 4.1 4.1  CL 103 103  CO2 23  --   GLUCOSE 250* 247*  BUN 8 8  CREATININE 0.53 0.50  CALCIUM 8.8*  --    GFR: Estimated Creatinine Clearance: 91.7 mL/min (by C-G formula based on SCr of 0.5 mg/dL). Recent Labs  Lab 03/02/19 0823  WBC 16.9*    Liver Function Tests: Recent Labs  Lab 03/02/19 0823  AST 8*  ALT 17  ALKPHOS 50  BILITOT 0.7  PROT 6.7  ALBUMIN 3.6   No results for input(s): LIPASE, AMYLASE in the last 168 hours. No results for input(s): AMMONIA in the last 168 hours.  ABG    Component Value Date/Time   PHART 7.388 09/04/2008 0600   PCO2ART 43.1 09/04/2008 0600   PO2ART 63.0 (L) 09/04/2008 0600   HCO3 25.4 (H) 09/04/2008 0600   TCO2 24 03/02/2019 0826   O2SAT 91.1 09/04/2008 0600     Coagulation Profile: Recent Labs  Lab 03/02/19 0823  INR 1.0    Cardiac Enzymes: No results for input(s): CKTOTAL, CKMB, CKMBINDEX, TROPONINI in the last 168 hours.  HbA1C: Hemoglobin A1C  Date/Time Value Ref Range Status  10/27/2015 07:22 PM 11.1  Final  07/28/2015 05:01 PM 11.3  Final   Hgb A1c MFr Bld  Date/Time Value Ref Range Status  11/08/2012 02:26 PM 9.2 (H) <5.7 % Final    Comment:    (NOTE)                                                                       According to the ADA Clinical Practice Recommendations for 2011, when HbA1c is  used as a screening test:  >=6.5%   Diagnostic of Diabetes Mellitus           (if abnormal result is confirmed) 5.7-6.4%   Increased risk of developing Diabetes Mellitus References:Diagnosis and Classification of Diabetes Mellitus,Diabetes KNLZ,7673,41(PFXTK 1):S62-S69 and Standards of Medical Care in         Diabetes - 2011,Diabetes WIOX,7353,29 (Suppl 1):S11-S61.  02/19/2007 08:41 AM 8.7 %     CBG: Recent Labs  Lab 03/02/19 0820  GLUCAP 239*    Review of Systems:   Not able   Past Medical History  She,  has a past medical history of  Allergy, Anxiety, Asthma, Back pain, Breast cancer ([place]), Cancer ([place]), Depression, Diabetes mellitus, Headache, Hyperlipemia, Osteopenia, and Osteoporosis.   Surgical History    Past Surgical History:  Procedure Laterality Date  . ABDOMINAL HYSTERECTOMY     B oophorectomy for BRCA1 gene  . BREAST SURGERY    . CESAREAN SECTION    . INCISION AND DRAINAGE ABSCESS Left 11/14/2012   Procedure: INCISION AND DRAINAGE ABSCESS LEFT GROIN;  Surgeon: Jamesetta So, MD;  Location: [facility];  Service: General;  Laterality: Left;  . LYMPHADENECTOMY    . MASTECTOMY Bilateral      Social History   reports that she has been smoking cigarettes. She has been smoking about 0.50 packs per day. She has never used smokeless tobacco. She reports that she does not drink alcohol or use drugs.   Family History   Her family history includes Cancer in her sister; Cancer (age of onset: 38) in her mother; Cancer (age of onset: 33) in her maternal grandfather and maternal grandmother; Hypertension in her sister; Hypothyroidism in her brother and sister.   Allergies Allergies  Allergen Reactions  . Azithromycin     Unknown  . Morphine And Related     Can tolerate hydrocodone and dilaudid without side effects  . Nitrofurantoin     Unknown  . Penicillins Hives and Itching    Has patient had a PCN reaction causing immediate rash, facial/tongue/throat swelling, SOB or  lightheadedness with hypotension: Yes Has patient had a PCN reaction causing severe rash involving mucus membranes or skin necrosis: No Has patient had a PCN reaction that required hospitalization No Has patient had a PCN reaction occurring within the last 10 years: No If all of the above answers are "NO", then may proceed with Cephalosporin use.   . Sulfonamide Derivatives Hives     Home Medications  Prior to Admission medications   Medication Sig Start Date End Date Taking? Authorizing Provider  albuterol (PROVENTIL HFA;VENTOLIN HFA) 108 (90 Base) MCG/ACT inhaler Inhale 1 puff into the lungs every 6 (six) hours as needed for wheezing or shortness of breath. 07/28/15   Robyn Haber, MD  albuterol (PROVENTIL) (2.5 MG/3ML) 0.083% nebulizer solution Take 3 mLs (2.5 mg total) by nebulization every 6 (six) hours as needed for wheezing or shortness of breath. 1/88/41   Delora Fuel, MD  aspirin EC 81 MG tablet Take 1 tablet (81 mg total) by mouth daily. 12/20/15   Kathie Dike, MD  DULoxetine (CYMBALTA) 60 MG capsule Take 2 capsules (120 mg total) by mouth daily. Patient taking differently: Take 60 mg by mouth daily.  12/06/15   Wardell Honour, MD  fenofibrate (TRICOR) 145 MG tablet Take 1 tablet (145 mg total) by mouth daily. 04/13/16   Wardell Honour, MD  glucose blood (CHOICE DM FORA G20 TEST STRIPS) test strip Use as instructed 10/27/15   Wardell Honour, MD  HYDROcodone-acetaminophen (NORCO) 7.5-325 MG tablet Take 1 tablet by mouth 2 (two) times daily as needed for moderate pain. 01/04/16   Wardell Honour, MD  ibuprofen (ADVIL,MOTRIN) 200 MG tablet Take 800 mg by mouth every 6 (six) hours as needed for fever. Reported on 07/28/2015    [provider]  Insulin Glargine (LANTUS SOLOSTAR) 100 UNIT/ML Solostar Pen Inject 20 Units into the skin daily at 10 pm. 12/20/15   Kathie Dike, MD  Insulin Pen Needle 32G X 4 MM MISC 1 application by Does not apply route daily. 01/04/16   Reginia Forts  M, MD  Lancets Glory Rosebush ULTRASOFT) lancets Use as instructed; check sugar once daily 10/27/15   Wardell Honour, MD  mometasone (NASONEX) 50 MCG/ACT nasal spray Place 2 sprays into the nose daily. 01/20/15   Robyn Haber, MD  naproxen (NAPROSYN) 500 MG tablet Take 1 tablet (500 mg total) by mouth 2 (two) times daily. 02/26/16   Nat Christen, MD  omeprazole (PRILOSEC) 40 MG capsule Take 1 capsule (40 mg total) by mouth daily. 10/27/15   Wardell Honour, MD  fluticasone Asencion Islam) 50 MCG/ACT nasal spray Place 2 sprays into the nose daily. Patient not taking: Reported on 03/10/2014 03/08/12 03/10/14  Robyn Haber, MD     Critical care time: 86 min      Erick Colace ACNP-BC [place] Pager # [phone] OR # [phone] if no answer

## 2019-03-02 NOTE — Progress Notes (Signed)
NIR.  Patient with history of acute CVA secondary to right ICA terminus occlusion, right MCA occlusion, and right ACA occlusion s/p emergent mechanical thrombectomy achieving a TICI 2C revascularization, along with stent assisted angioplasty of symptomatic proximal right ICA stenosis via right femoral approach 03/02/2019 by Dr. Estanislado Pandy.  Assessed patient bedside in neuro ICU following procedure. Patient laying in bed with NRB mask in place, does not respond to voice and does not follow simple commands. Accompanied by son at bedside.  She does not respond to voice and does not follow simple commands. PERRL bilaterally. Can spontaneously move right side, minimal movements of LLE (foot mainly), no movements of LUE. Positive clonus of bilateral lower extremities. Distal pulses- DPs 2+ bilaterally, PTs palpable bilaterally with Doppler. Right groin incision soft without active bleeding or hematoma.  Right groin stable. Awaiting head CT and EEG. Further plans per neurology and CCM- appreciate and agree with management. NIR to follow.   Bea Graff Louk, PA-C 03/02/2019, 2:36 PM

## 2019-03-02 NOTE — Anesthesia Procedure Notes (Signed)
Arterial Line Insertion Start/End11/23/2020 9:10 AM, 03/02/2019 9:20 AM Performed by: Roberts Gaudy, MD, anesthesiologist  Preanesthetic checklist: patient identified, IV checked, site marked, risks and benefits discussed, surgical consent, monitors and equipment checked, pre-op evaluation and timeout performed Left, radial was placed Catheter size: 20 G Hand hygiene performed  and maximum sterile barriers used   Attempts: 1 Procedure performed without using ultrasound guided technique. Following insertion, dressing applied. Post procedure assessment: normal and unchanged  Patient tolerated the procedure well with no immediate complications.

## 2019-03-02 NOTE — Progress Notes (Signed)
Patient ID: Ann Lopez, female   DOB: 11-11-1970, 48 y.o.   MRN: [id number] INR 49 Y RT H F LSW 11pm LN  New onset of RT gaze deviation,lt sided weakness and neglect. CT brain no ICH ASPECTS 9 CTA occluded RT ICA terminus aRT MCA and RT ACA. Also Severe RT ICA prox stenosis.. CTP Core 61ml v penumbra of 65 ml . Endovascular option D/W patient . Reasons,alternatives reviewed. Risks of of ICH of 10 % ,wrsening neuro df\eficit death and inability to revascularize reviewed. Patient expressed understanding of the medical condition and consented to proceed. S.Sharalee Witman MD

## 2019-03-02 NOTE — Code Documentation (Signed)
48 yo female coming from home with complaints of new onset of left sided weakness. Pt was LKW at 2300 last night by her 72 year old son. Pt woke up this morning at 0545 and walked to the bathroom. While on the toilet, pt reported being unable to get off. She fell to the ground and the patient had to move in the floor to her phone. Her son found her down and called EMS. EMS activated a Code Stroke. Patient arrived at 62. Stroke Team met patient in CT. Initial NIHSS 12 due to drowsiness, left sided arm and leg weakness, right gaze preference, and left sided neglect - See Stroke Assessment Flowsheet for details. CT Head completed. CTA/CTP completed. IR called at 0839 to activate. Pt taken directly to [place] [facility] and arrived at [phone]. Pt intubated and transferred to IR Suite 2. Handoff given to [place], Therapist, sports.   31 yo son present shortly after arrival. Placed in waiting room by neurologist. Older son, Braulio Conte, arrived after procedure began and taken to be with his brother.

## 2019-03-02 NOTE — Progress Notes (Signed)
She has increased tone in the left leg after coming back from intervention. More movement in the left arm than prior to procedure. Eyes are midline and she has purposeful movement on the right. Low suspicion for seizure, but will exclude this. Also, she had what I suspect is contrast staining on her post-op scan, will repeat to make sure this is not hemorrhagic conversion.   1) Stat head CT.  2) EEG  Roland Rack, MD [facility] [phone]  If 7pm- 7am, please page neurology on call as listed in [place].

## 2019-03-02 NOTE — Procedures (Signed)
Echo attempted. EEG with patient. Will attempt again. 

## 2019-03-02 NOTE — ED Provider Notes (Signed)
[facility] EMERGENCY DEPARTMENT Provider Note   CSN: [id number] Arrival date & time: 03/02/19  4098  An emergency department physician performed an initial assessment on this suspected stroke patient at 0822.  History   Chief Complaint Chief Complaint  Patient presents with   Code Stroke    HPI Ann Lopez is a 48 y.o. female.     HPI Patient came in as a code stroke.  Last normal initially reported 545 this morning when she got up and went to the bathroom.  Developed a headache while going to the bathroom.  Son had seen her at 11:00 last night.  Reportedly fell off the toilet.  Was able to pull her self into the other room.  Not on anticoagulation.  No chest pain.  Still has headache.  History of breast cancer. Past Medical History:  Diagnosis Date   Allergy    Anxiety    Asthma    Back pain    Breast cancer ([place])    Cancer ([place])    Depression    Diabetes mellitus    Headache    Hyperlipemia    Osteopenia    Osteoporosis    osteopenia    Patient Active Problem List   Diagnosis Date Noted   Stroke (cerebrum) ([place]) 03/02/2019   Chest pain 12/19/2015   Numbness 12/19/2015   Femoral hernia 03/24/2014   Dyslipidemia 09/10/2011   BRCA1 positive 09/10/2011   Breast cancer ([place]) 09/10/2011   H/O gastric bypass; 05/14/11([facility]) 09/10/2011   Essential hypertension 10/28/2008   DEPRESSION/ANXIETY 02/19/2007   CARPAL TUNNEL SYNDROME, RIGHT 02/19/2007   ALLERGIC RHINITIS 02/19/2007   GERD 02/19/2007   IRRITABLE BOWEL SYNDROME 02/19/2007   HERPES GENITALIS 12/30/2006   Diabetes ([place]) 12/30/2006   OSTEOPENIA 12/30/2006   ANOMALY, CONGENITAL, NERVOUS SYSTEM NEC 12/30/2006    Past Surgical History:  Procedure Laterality Date   ABDOMINAL HYSTERECTOMY     B oophorectomy for BRCA1 gene   BREAST SURGERY     CESAREAN SECTION     INCISION AND DRAINAGE ABSCESS Left 11/14/2012   Procedure: INCISION AND DRAINAGE ABSCESS  LEFT GROIN;  Surgeon: Jamesetta So, MD;  Location: [facility];  Service: General;  Laterality: Left;   LYMPHADENECTOMY     MASTECTOMY Bilateral      OB History   No obstetric history on file.      Home Medications    Prior to Admission medications   Medication Sig Start Date End Date Taking? Authorizing Provider  albuterol (PROVENTIL HFA;VENTOLIN HFA) 108 (90 Base) MCG/ACT inhaler Inhale 1 puff into the lungs every 6 (six) hours as needed for wheezing or shortness of breath. 07/28/15   Robyn Haber, MD  albuterol (PROVENTIL) (2.5 MG/3ML) 0.083% nebulizer solution Take 3 mLs (2.5 mg total) by nebulization every 6 (six) hours as needed for wheezing or shortness of breath. 04/27/12   Delora Fuel, MD  aspirin EC 81 MG tablet Take 1 tablet (81 mg total) by mouth daily. 12/20/15   Kathie Dike, MD  DULoxetine (CYMBALTA) 60 MG capsule Take 2 capsules (120 mg total) by mouth daily. Patient taking differently: Take 60 mg by mouth daily.  12/06/15   Wardell Honour, MD  fenofibrate (TRICOR) 145 MG tablet Take 1 tablet (145 mg total) by mouth daily. 04/13/16   Wardell Honour, MD  glucose blood (CHOICE DM FORA G20 TEST STRIPS) test strip Use as instructed 10/27/15   Wardell Honour, MD  HYDROcodone-acetaminophen [facility]) 7.5-325 MG tablet  Take 1 tablet by mouth 2 (two) times daily as needed for moderate pain. 01/04/16   Wardell Honour, MD  ibuprofen (ADVIL,MOTRIN) 200 MG tablet Take 800 mg by mouth every 6 (six) hours as needed for fever. Reported on 07/28/2015    [provider]  Insulin Glargine (LANTUS SOLOSTAR) 100 UNIT/ML Solostar Pen Inject 20 Units into the skin daily at 10 pm. 12/20/15   Kathie Dike, MD  Insulin Pen Needle 32G X 4 MM MISC 1 application by Does not apply route daily. 01/04/16   Wardell Honour, MD  Lancets [facility] ULTRASOFT) lancets Use as instructed; check sugar once daily 10/27/15   Wardell Honour, MD  mometasone (NASONEX) 50 MCG/ACT nasal spray Place 2 sprays  into the nose daily. 01/20/15   Robyn Haber, MD  naproxen (NAPROSYN) 500 MG tablet Take 1 tablet (500 mg total) by mouth 2 (two) times daily. 02/26/16   Nat Christen, MD  omeprazole (PRILOSEC) 40 MG capsule Take 1 capsule (40 mg total) by mouth daily. 10/27/15   Wardell Honour, MD  fluticasone Asencion Islam) 50 MCG/ACT nasal spray Place 2 sprays into the nose daily. Patient not taking: Reported on 03/10/2014 03/08/12 03/10/14  Robyn Haber, MD    Family History Family History  Problem Relation Age of Onset   Cancer Mother 57       breast cancer   Hypertension Sister    Hypothyroidism Sister    Cancer Sister    Hypothyroidism Brother    Cancer Maternal Grandmother 21       breast cancer   Cancer Maternal Grandfather 65       pancreatic cancer    Social History Social History   Tobacco Use   Smoking status: Current Some Day Smoker    Packs/day: 0.50    Types: Cigarettes   Smokeless tobacco: Never Used  Substance Use Topics   Alcohol use: No    Alcohol/week: 0.0 standard drinks   Drug use: No     Allergies   Azithromycin, Morphine and related, Nitrofurantoin, Penicillins, and Sulfonamide derivatives   Review of Systems Review of Systems  Constitutional: Negative for diaphoresis.  Respiratory: Negative for shortness of breath.   Cardiovascular: Negative for chest pain.  Gastrointestinal: Negative for abdominal pain.  Genitourinary: Negative for enuresis.  Musculoskeletal: Negative for back pain.  Neurological: Positive for weakness and headaches.  Psychiatric/Behavioral: Negative for confusion.     Physical Exam Updated Vital Signs BP 109/72    Pulse 81    Resp (!) 23    Ht _0  (1.626 m)    Wt 83.3 kg    SpO2 98%    BMI 31.52 kg/m   Physical Exam Vitals signs and nursing note reviewed.  HENT:     Head: Normocephalic.  Eyes:     Comments: Patient will look to the left.  Neck:     Musculoskeletal: Neck supple.  Cardiovascular:     Rate and  Rhythm: Regular rhythm.  Pulmonary:     Breath sounds: No wheezing or rhonchi.  Abdominal:     Tenderness: There is no abdominal tenderness.  Musculoskeletal:        General: No tenderness.  Skin:    Capillary Refill: Capillary refill takes less than 2 seconds.  Neurological:     Mental Status: She is alert and oriented to person, place, and time.     Comments: Patient is awake and appropriate.  However does have left-sided neglect.  Will not move left  side to command.  However will look to the side.  Normal speech.  Complete NIH scoring done by neurology.      ED Treatments / Results  Labs (all labs ordered are listed, but only abnormal results are displayed) Labs Reviewed  CBC - Abnormal; Notable for the following components:      Result Value   WBC 16.9 (*)    All other components within normal limits  DIFFERENTIAL - Abnormal; Notable for the following components:   Neutro Abs 14.0 (*)    Abs Immature Granulocytes 0.08 (*)    All other components within normal limits  COMPREHENSIVE METABOLIC PANEL - Abnormal; Notable for the following components:   Glucose, Bld 250 (*)    Calcium 8.8 (*)    AST 8 (*)    All other components within normal limits  I-STAT CHEM 8, ED - Abnormal; Notable for the following components:   Glucose, Bld 247 (*)    Calcium, Ion 1.11 (*)    Hemoglobin 15.3 (*)    All other components within normal limits  CBG MONITORING, ED - Abnormal; Notable for the following components:   Glucose-Capillary 239 (*)    All other components within normal limits  SARS CORONAVIRUS 2 BY RT PCR (HOSPITAL ORDER, [facility])  ETHANOL  PROTIME-INR  APTT  RAPID URINE DRUG SCREEN, HOSP PERFORMED  URINALYSIS, ROUTINE W REFLEX MICROSCOPIC  HIV ANTIBODY (ROUTINE TESTING W REFLEX)  HEMOGLOBIN A1C  I-STAT BETA HCG BLOOD, ED ([facility], [facility], [facility] ONLY)  POC SARS CORONAVIRUS 2 AG -  ED    EKG None  Radiology Ct Angio Head W Or Wo Contrast  Result  Date: 03/02/2019 CLINICAL DATA:  Stroke code.  Left-sided weakness EXAM: CT ANGIOGRAPHY HEAD AND NECK CT PERFUSION BRAIN TECHNIQUE: Multidetector CT imaging of the head and neck was performed using the standard protocol during bolus administration of intravenous contrast. Multiplanar CT image reconstructions and MIPs were obtained to evaluate the vascular anatomy. Carotid stenosis measurements (when applicable) are obtained utilizing NASCET criteria, using the distal internal carotid diameter as the denominator. Multiphase CT imaging of the brain was performed following IV bolus contrast injection. Subsequent parametric perfusion maps were calculated using RAPID software. CONTRAST:  120m OMNIPAQUE IOHEXOL 350 MG/ML SOLN COMPARISON:  Noncontrast head CT performed earlier the same day. FINDINGS: CTA NECK FINDINGS Aortic arch: Standard branching. Right carotid system: CCA widely patent. Abnormal hypodensity within the origin of the right ICA and within the right carotid bulb, likely reflecting a combination of soft plaque and adherent thrombus with resultant high-grade stenosis. Distal to this, the right ICA is asymmetrically small in caliber within the neck, but patent. Left carotid system: CCA widely patent. Mild soft plaque at the carotid bifurcation and within the proximal ICA without significant stenosis of the proximal ICA (50% or greater). Distal to this, the left ICA is widely patent within the neck without stenosis. Vertebral arteries: Codominant. Streak artifact from a dense left-sided contrast bolus somewhat limits evaluation of the V1 left vertebral artery. Within this limitation, the bilateral vertebral arteries are patent throughout the neck without significant stenosis (50% or greater). Skeleton: No acute bony abnormality. Congenital C3-C4 vertebral body fusion Other neck: No soft tissue neck mass or pathologically enlarged cervical chain lymph nodes. 1.7 cm nodule within the right thyroid lobe. Upper  chest: No consolidation within the visualized lung apices. Review of the MIP images confirms the above findings CTA HEAD FINDINGS Anterior circulation: The right internal carotid  artery is patent through the siphon region. There is occlusive or near occlusive thrombus within the right ICA terminus with occlusive/near occlusive thrombus extending into the M1 right middle cerebral artery and A1 right anterior cerebral artery. There is some reconstitution of flow within proximal M2 and more distal right MCA branches. However, there are additional high-grade stenoses within proximal to mid right M2 branches, likely reflecting additional thrombus Reconstitution of flow within the distal A1 right ACA, likely from cross flow via the anterior communicating artery. The A1 left ACA is patent without significant stenosis. The A2 and more distal anterior cerebral arteries are patent. The intracranial left internal carotid arteries patent without significant stenosis. The left middle cerebral artery is patent without significant proximal stenosis. No intracranial aneurysm is identified. Posterior circulation: The intracranial vertebral arteries are patent without significant stenosis, as is the basilar artery. The bilateral posterior cerebral arteries are patent without significant proximal stenosis. A sizable right posterior communicating artery is present and patent, although the direction of flow within this vessel is uncertain. Venous sinuses: Within limitations of contrast timing, no convincing thrombus. Anatomic variants: As described. Review of the MIP images confirms the above findings CT Brain Perfusion Findings: CBF (<30%) Volume: 42m Perfusion (Tmax>6.0s) volume: 750mMismatch Volume: 658mnfarction Location: Right MCA vascular territory IMPRESSION: CTA neck: 1. Prominent abnormal hypodensity within the origin of the right internal carotid artery and right carotid bulb, likely reflecting a combination of soft plaque  and adherent thrombus. Resultant high-grade stenosis at this site. 2. Left common and internal carotid arteries patent within the neck without significant stenosis. 3. Bilateral vertebral arteries patent within the neck without significant stenosis. 4. 1.7 cm left thyroid lobe nodule. Nonemergent thyroid ultrasound recommended for further evaluation. CTA head: 1. Occlusive/near occlusive thrombus within the right ICA terminus, extending into the M1 right middle cerebral artery and A1 right anterior cerebral artery. There is some reconstitution of flow within proximal M2 and more distal right MCA branches. However, there are additional high-grade stenoses within proximal to mid right M2 branches, likely reflecting additional thrombus. Opacification of the distal A1 right ACA and more distal right ACA, likely from cross flow via the anterior communicating artery. 2. No other intracranial large vessel occlusion or proximal high-grade arterial stenosis. CT perfusion head: 5 mL core infarct within the right MCA vascular territory. 70 mL region of hypoperfused parenchyma within the right MCA vascular territory. Reported mismatch volume 65 mL. Electronically Signed   By: KylKellie Simmering   On: 03/02/2019 09:31   Ct Angio Neck W Or Wo Contrast  Result Date: 03/02/2019 CLINICAL DATA:  Stroke code.  Left-sided weakness. EXAM: CT ANGIOGRAPHY HEAD AND NECK CT PERFUSION BRAIN TECHNIQUE: Multidetector CT imaging of the head and neck was performed using the standard protocol during bolus administration of intravenous contrast. Multiplanar CT image reconstructions and MIPs were obtained to evaluate the vascular anatomy. Carotid stenosis measurements (when applicable) are obtained utilizing NASCET criteria, using the distal internal carotid diameter as the denominator. Multiphase CT imaging of the brain was performed following IV bolus contrast injection. Subsequent parametric perfusion maps were calculated using RAPID software.  CONTRAST:  Intravenous contrast given not known at this time COMPARISON:  Noncontrast head CT FINDINGS: CTA NECK FINDINGS Aortic arch: Standard branching. Right carotid system: CCA widely patent. Abnormal hypodensity within the origin of the right ICA and within the right carotid bulb, likely reflecting a combination of soft plaque and adherent thrombus with resultant high-grade stenosis. Distal to this,  the right ICA is asymmetrically small in caliber within the neck, but patent. Left carotid system: CCA widely patent. Mild soft plaque at the carotid bifurcation and within the proximal ICA without significant stenosis of the proximal ICA (50% or greater). Distal to this, the left ICA is widely patent within the neck without stenosis. Vertebral arteries: Codominant. Streak artifact from a dense left-sided contrast bolus somewhat limits evaluation of the V1 left vertebral artery. Within this limitation, the bilateral vertebral arteries are patent throughout the neck without significant stenosis (50% or greater). Skeleton: No acute bony abnormality. Congenital C3-C4 vertebral body fusion Other neck: No soft tissue neck mass or pathologically enlarged cervical chain lymph nodes. 1.7 cm nodule within the right thyroid lobe. Upper chest: No consolidation within the visualized lung apices. Review of the MIP images confirms the above findings CTA HEAD FINDINGS Anterior circulation: The right internal carotid artery is patent through the siphon region. There is occlusive or near occlusive thrombus within the right ICA terminus with occlusive/near occlusive thrombus extending into the M1 right middle cerebral artery and A1 right anterior cerebral artery. There is some reconstitution of flow within proximal M2 and more distal right MCA branches. However, there are additional high-grade stenoses within proximal to mid right M2 branches, likely reflecting additional thrombus Reconstitution of flow within the distal A1 right  ACA, likely from cross flow via the anterior communicating artery. The A1 left ACA is patent without significant stenosis. The A2 and more distal anterior cerebral arteries are patent. The intracranial left internal carotid arteries patent without significant stenosis. The left middle cerebral artery is patent without significant proximal stenosis. No intracranial aneurysm is identified. Posterior circulation: The intracranial vertebral arteries are patent without significant stenosis, as is the basilar artery. The bilateral posterior cerebral arteries are patent without significant proximal stenosis. A sizable right posterior communicating artery is present and patent, although the direction of flow within this vessel is uncertain. Venous sinuses: Within limitations of contrast timing, no convincing thrombus. Anatomic variants: As described. Review of the MIP images confirms the above findings CT Brain Perfusion Findings: CBF (<30%) Volume: 34m Perfusion (Tmax>6.0s) volume: 759mMismatch Volume: 6540mnfarction Location:Right MCA vascular territory These results were called by telephone at the time of interpretation on 03/02/2019 at 8:46 am to provider MCNEILL KIRCerritos Surgery Centerwho verbally acknowledged these results. IMPRESSION: CTA neck: 1. Prominent abnormal hypodensity within the origin of the right internal carotid artery and right carotid bulb, likely reflecting a combination of soft plaque and adherent thrombus. Resultant high-grade stenosis at this site. 2. Left common and internal carotid arteries patent within the neck without significant stenosis. 3. Bilateral vertebral arteries patent within the neck without significant stenosis. 4. 1.7 cm left thyroid lobe nodule. Nonemergent thyroid ultrasound recommended for further evaluation. CTA head: 1. Occlusive/near occlusive thrombus within the right ICA terminus, extending into the M1 right middle cerebral artery and A1 right anterior cerebral artery. There is  some reconstitution of flow within proximal M2 and more distal right MCA branches. However, there are additional high-grade stenoses within proximal to mid right M2 branches, likely reflecting additional thrombus. Opacification of the distal A1 right ACA and more distal right ACA, likely from cross flow via the anterior communicating artery. 2. No other intracranial large vessel occlusion or proximal high-grade arterial stenosis. CT perfusion head: 5 mL core infarct within the right MCA vascular territory. 70 mL region of hypoperfused parenchyma within the right MCA vascular territory. Reported mismatch volume 65 mL. Electronically Signed  By: Kellie Simmering DO   On: 03/02/2019 09:08   Ct Cerebral Perfusion W Contrast  Result Date: 03/02/2019 CLINICAL DATA:  Stroke code.  Left-sided weakness EXAM: CT ANGIOGRAPHY HEAD AND NECK CT PERFUSION BRAIN TECHNIQUE: Multidetector CT imaging of the head and neck was performed using the standard protocol during bolus administration of intravenous contrast. Multiplanar CT image reconstructions and MIPs were obtained to evaluate the vascular anatomy. Carotid stenosis measurements (when applicable) are obtained utilizing NASCET criteria, using the distal internal carotid diameter as the denominator. Multiphase CT imaging of the brain was performed following IV bolus contrast injection. Subsequent parametric perfusion maps were calculated using RAPID software. CONTRAST:  172m OMNIPAQUE IOHEXOL 350 MG/ML SOLN COMPARISON:  Noncontrast head CT performed earlier the same day. FINDINGS: CTA NECK FINDINGS Aortic arch: Standard branching. Right carotid system: CCA widely patent. Abnormal hypodensity within the origin of the right ICA and within the right carotid bulb, likely reflecting a combination of soft plaque and adherent thrombus with resultant high-grade stenosis. Distal to this, the right ICA is asymmetrically small in caliber within the neck, but patent. Left carotid system:  CCA widely patent. Mild soft plaque at the carotid bifurcation and within the proximal ICA without significant stenosis of the proximal ICA (50% or greater). Distal to this, the left ICA is widely patent within the neck without stenosis. Vertebral arteries: Codominant. Streak artifact from a dense left-sided contrast bolus somewhat limits evaluation of the V1 left vertebral artery. Within this limitation, the bilateral vertebral arteries are patent throughout the neck without significant stenosis (50% or greater). Skeleton: No acute bony abnormality. Congenital C3-C4 vertebral body fusion Other neck: No soft tissue neck mass or pathologically enlarged cervical chain lymph nodes. 1.7 cm nodule within the right thyroid lobe. Upper chest: No consolidation within the visualized lung apices. Review of the MIP images confirms the above findings CTA HEAD FINDINGS Anterior circulation: The right internal carotid artery is patent through the siphon region. There is occlusive or near occlusive thrombus within the right ICA terminus with occlusive/near occlusive thrombus extending into the M1 right middle cerebral artery and A1 right anterior cerebral artery. There is some reconstitution of flow within proximal M2 and more distal right MCA branches. However, there are additional high-grade stenoses within proximal to mid right M2 branches, likely reflecting additional thrombus Reconstitution of flow within the distal A1 right ACA, likely from cross flow via the anterior communicating artery. The A1 left ACA is patent without significant stenosis. The A2 and more distal anterior cerebral arteries are patent. The intracranial left internal carotid arteries patent without significant stenosis. The left middle cerebral artery is patent without significant proximal stenosis. No intracranial aneurysm is identified. Posterior circulation: The intracranial vertebral arteries are patent without significant stenosis, as is the basilar  artery. The bilateral posterior cerebral arteries are patent without significant proximal stenosis. A sizable right posterior communicating artery is present and patent, although the direction of flow within this vessel is uncertain. Venous sinuses: Within limitations of contrast timing, no convincing thrombus. Anatomic variants: As described. Review of the MIP images confirms the above findings CT Brain Perfusion Findings: CBF (<30%) Volume: 56mPerfusion (Tmax>6.0s) volume: 44m51mismatch Volume: 29m60mfarction Location: Right MCA vascular territory IMPRESSION: CTA neck: 1. Prominent abnormal hypodensity within the origin of the right internal carotid artery and right carotid bulb, likely reflecting a combination of soft plaque and adherent thrombus. Resultant high-grade stenosis at this site. 2. Left common and internal carotid arteries patent within the  neck without significant stenosis. 3. Bilateral vertebral arteries patent within the neck without significant stenosis. 4. 1.7 cm left thyroid lobe nodule. Nonemergent thyroid ultrasound recommended for further evaluation. CTA head: 1. Occlusive/near occlusive thrombus within the right ICA terminus, extending into the M1 right middle cerebral artery and A1 right anterior cerebral artery. There is some reconstitution of flow within proximal M2 and more distal right MCA branches. However, there are additional high-grade stenoses within proximal to mid right M2 branches, likely reflecting additional thrombus. Opacification of the distal A1 right ACA and more distal right ACA, likely from cross flow via the anterior communicating artery. 2. No other intracranial large vessel occlusion or proximal high-grade arterial stenosis. CT perfusion head: 5 mL core infarct within the right MCA vascular territory. 70 mL region of hypoperfused parenchyma within the right MCA vascular territory. Reported mismatch volume 65 mL. Electronically Signed   By: Kellie Simmering DO   On:  03/02/2019 09:31   Ct Head Code Stroke Wo Contrast  Result Date: 03/02/2019 CLINICAL DATA:  Code stroke. Altered level of consciousness (LOC), unexplained. Last known normal 2300, left-sided weakness. EXAM: CT HEAD WITHOUT CONTRAST TECHNIQUE: Contiguous axial images were obtained from the base of the skull through the vertex without intravenous contrast. COMPARISON:  Brain MRI 12/19/2015, head CT 12/19/2015 FINDINGS: Brain: Moderate to severe motion degradation, significantly limiting evaluation. No acute intracranial hemorrhage or acute demarcated cortical infarct definitively identified. No intracranial mass identified. No midline shift. No extra-axial fluid collection identified. Vascular: No hyperdense vessel identified Skull: No calvarial fracture or suspicious osseous lesion identified. Sinuses/Orbits: Globes unremarkable within described limitations. Ethmoid sinus mucosal thickening. No significant mastoid effusion. ASPECTS [facility] Stroke Program Early CT Score) difficult to report with confidence given degree of motion degradation. These results were communicated to Dr. Leonel Ramsay At 8:36 amon 11/23/2020by text page via the [facility] messaging system. IMPRESSION: 1. Moderate to severe motion degradation significantly limits evaluation. 2. No acute intracranial hemorrhage or demarcated cortical infarction definitively identified. Electronically Signed   By: Kellie Simmering DO   On: 03/02/2019 08:36    Procedures Procedures (including critical care time)  Medications Ordered in ED Medications  tirofiban (AGGRASTAT) 5-0.9 MG/100ML-% injection (has no administration in time range)  aspirin 81 MG chewable tablet (has no administration in time range)  ticagrelor (BRILINTA) 90 MG tablet (has no administration in time range)  clopidogrel (PLAVIX) 300 MG tablet (has no administration in time range)  eptifibatide (INTEGRILIN) 20 MG/10ML injection (has no administration in time range)  vancomycin  (VANCOCIN) 1-5 GM/200ML-% IVPB (has no administration in time range)  nitroGLYCERIN 100 mcg/mL intra-arterial injection (has no administration in time range)   stroke: mapping our early stages of recovery book (has no administration in time range)  0.9 %  sodium chloride infusion ( Intravenous Anesthesia Volume Adjustment 03/02/19 1036)  acetaminophen (TYLENOL) tablet 650 mg (has no administration in time range)    Or  acetaminophen (TYLENOL) 160 MG/5ML solution 650 mg (has no administration in time range)    Or  acetaminophen (TYLENOL) suppository 650 mg (has no administration in time range)  aspirin suppository 300 mg (has no administration in time range)    Or  aspirin tablet 325 mg (has no administration in time range)  ticagrelor (BRILINTA) tablet (180 mg Per Tube Given 03/02/19 0934)  aspirin chewable tablet (81 mg Per Tube Given 03/02/19 0934)  insulin aspart (novoLOG) injection 0-15 Units (has no administration in time range)  iohexol (OMNIPAQUE) 300 MG/ML solution 150 mL (  has no administration in time range)  DULoxetine (CYMBALTA) DR capsule 60 mg (has no administration in time range)  nitroGLYCERIN 1 mg/10 mL (100 mcg/mL) - IR/CATH LAB (25 mcg Intra-arterial Given 03/02/19 1009)  eptifibatide (INTEGRILIN) injection (1.5 mg Intravenous Given 03/02/19 1042)  iohexol (OMNIPAQUE) 350 MG/ML injection 100 mL (100 mLs Intravenous Contrast Given 03/02/19 0833)  iohexol (OMNIPAQUE) 300 MG/ML solution 150 mL (75 mLs Intra-arterial Contrast Given 03/02/19 1029)  insulin aspart (novoLOG) 100 UNIT/ML injection (  Override pull for Anesthesia 03/02/19 1047)     Initial Impression / Assessment and Plan / ED Course  I have reviewed the triage vital signs and the nursing notes.  Pertinent labs & imaging results that were available during my care of the patient were reviewed by me and considered in my medical decision making (see chart for details).      Patient came in as a code stroke.   Left-sided weakness and neglect.  Due to the neglect difficult to know when the symptoms started.  Last normal can be confirmed at 11 PM last night however.  Initial head CT reassuring but CTA and perfusion did show occlusion.  Taken emergently to interventional radiology.  CRITICAL CARE Performed by: Davonna Belling Total critical care time: 30 minutes Critical care time was exclusive of separately billable procedures and treating other patients. Critical care was necessary to treat or prevent imminent or life-threatening deterioration. Critical care was time spent personally by me on the following activities: development of treatment plan with patient and/or surrogate as well as nursing, discussions with consultants, evaluation of patient's response to treatment, examination of patient, obtaining history from patient or surrogate, ordering and performing treatments and interventions, ordering and review of laboratory studies, ordering and review of radiographic studies, pulse oximetry and re-evaluation of patient's condition.  Final Clinical Impressions(s) / ED Diagnoses   Final diagnoses:  Cerebrovascular accident (CVA), unspecified mechanism [facility])    ED Discharge Orders    None       Davonna Belling, MD 03/02/19 1056

## 2019-03-02 NOTE — Progress Notes (Signed)
NIR.  Patient with history of acute CVA secondary to right ICA terminus occlusion, right MCA occlusion, and right ACA occlusion s/p emergent mechanical thrombectomy achieving a TICI 2C revascularization, along with stent assisted angioplasty of symptomatic proximal right ICA stenosis via right femoral approach 03/02/2019 by Dr. Estanislado Pandy.  Repeat head CT from today reviewed- no acute hemorrhage.  Assessed patient bedside in neuro ICU alongside Dr. Estanislado Pandy. Patient laying in bed with NRB mask in place, more responsive than earlier (see prior note)- responds to voice and follows some simple commands.  Alert x3. Responds to voice and follows simple commands. PERRL bilaterally. Can spontaneously move right side and LUE, can bend left knee. Positive clonus of LLE. Distal pulses- DPs 2+ bilaterally, PTs palpable bilaterally with Doppler. Right groin incision soft without active bleeding or hematoma.  Right groin stable. Awaiting EEG. Further plans per neurology and CCM- appreciate and agree with management. NIR to follow.   Ann Graff Malaia Buchta, PA-C 03/02/2019, 3:48 PM

## 2019-03-02 NOTE — Progress Notes (Signed)
PT Cancellation Note  Patient Details Name: Ann Lopez MRN: [id number] DOB: 09/29/1970   Cancelled Treatment:    Reason Eval/Treat Not Completed: Active bedrest order  Noted sheath pulled and on bedrest until 03/03/19 0700.   Barry Brunner, PT Pager [phone]  Rexanne Mano 03/02/2019, 4:36 PM

## 2019-03-02 NOTE — Progress Notes (Signed)
Per MD Deveshwar- ok to have Paxton raised to no more that 15 degrees /HOB flat with reverse trendelenberg until 0700 11/24.

## 2019-03-02 NOTE — Anesthesia Postprocedure Evaluation (Signed)
Anesthesia Post Note  Patient: Ann Lopez  Procedure(s) Performed: IR WITH ANESTHESIA (N/A )     Patient location during evaluation: ICU Anesthesia Type: General Level of consciousness: sedated, responds to stimulation and confused Pain management: pain level controlled Vital Signs Assessment: post-procedure vital signs reviewed and stable Respiratory status: spontaneous breathing, nonlabored ventilation, respiratory function stable and patient connected to nasal cannula oxygen Cardiovascular status: blood pressure returned to baseline and stable Postop Assessment: no apparent nausea or vomiting Anesthetic complications: no    Last Vitals:  Vitals:   03/02/19 0833 03/02/19 0839  BP: 116/71 109/72  Pulse: 80 81  Resp: 18 (!) 23  SpO2: 98% 98%    Last Pain: There were no vitals filed for this visit.               Audry Pili

## 2019-03-02 NOTE — H&P (Signed)
Neurology H&P  CC: Left-sided weakness  History is obtained from: Patient, son  HPI: Ann Lopez is a 48 y.o. female with a history of diabetes, breast cancer who presents with left-sided weakness that started abruptly this morning.  She does have neglect, making her history unreliable.  She was last seen by her son at 56 PM last night and then she awoke and was able to walk to be bathroom this morning.  Though this could indicate that she was well at that time, I do not feel certain that she was asymptomatic given the degree of her neglect.  On arrival, she was noted to have left-sided weakness with right gaze and was taken for emergent CT/CT perfusion.  CT perfusion indicated that she had a relatively large penumbra with a small core and an M1 occlusion and therefore she was taken for emergent thrombectomy. There is a small amount of edema in the insula, otherwise no clear stroke on CT.   The son who accompanied her is a minor, and I tried calling her adult son to discuss consent.  I was unable to reach him, and the patient was alert and oriented and wanted to proceed with the procedure.  Proceeding with thrombectomy is also standard of care at this point and therefore could be undertaken with emergency two-physician consent as well(Dr. Kathi Ludwig agrees).  LKW: 11 PM tpa given?: No, outside of window IR Thrombectomy?  Yes Modified Rankin Scale: 0-Completely asymptomatic and back to baseline post- stroke NIHSS: 12   ROS: A complete ROS was performed and is negative except as noted in the HPI.  Past Medical History:  Diagnosis Date  . Allergy   . Anxiety   . Asthma   . Back pain   . Breast cancer ([place])   . Cancer ([place])   . Depression   . Diabetes mellitus   . Headache   . Hyperlipemia   . Osteopenia   . Osteoporosis    osteopenia   Breast Cancer 2009- - T2N0M0, s/p surgical excision   Family History  Problem Relation Age of Onset  . Cancer Mother 48       breast cancer   . Hypertension Sister   . Hypothyroidism Sister   . Cancer Sister   . Hypothyroidism Brother   . Cancer Maternal Grandmother 35       breast cancer  . Cancer Maternal Grandfather 62       pancreatic cancer     Social History:  reports that she has been smoking cigarettes. She has been smoking about 0.50 packs per day. She has never used smokeless tobacco. She reports that she does not drink alcohol or use drugs.   Prior to Admission medications   Medication Sig Start Date End Date Taking? Authorizing Provider  albuterol (PROVENTIL HFA;VENTOLIN HFA) 108 (90 Base) MCG/ACT inhaler Inhale 1 puff into the lungs every 6 (six) hours as needed for wheezing or shortness of breath. 07/28/15   Robyn Haber, MD  albuterol (PROVENTIL) (2.5 MG/3ML) 0.083% nebulizer solution Take 3 mLs (2.5 mg total) by nebulization every 6 (six) hours as needed for wheezing or shortness of breath. [id number]   Delora Fuel, MD  aspirin EC 81 MG tablet Take 1 tablet (81 mg total) by mouth daily. 12/20/15   Kathie Dike, MD  DULoxetine (CYMBALTA) 60 MG capsule Take 2 capsules (120 mg total) by mouth daily. Patient taking differently: Take 60 mg by mouth daily.  12/06/15   Wardell Honour, MD  fenofibrate (TRICOR) 145 MG tablet Take 1 tablet (145 mg total) by mouth daily. 04/13/16   Wardell Honour, MD  glucose blood (CHOICE DM FORA G20 TEST STRIPS) test strip Use as instructed 10/27/15   Wardell Honour, MD  HYDROcodone-acetaminophen (NORCO) 7.5-325 MG tablet Take 1 tablet by mouth 2 (two) times daily as needed for moderate pain. 01/04/16   Wardell Honour, MD  ibuprofen (ADVIL,MOTRIN) 200 MG tablet Take 800 mg by mouth every 6 (six) hours as needed for fever. Reported on 07/28/2015    [provider]  Insulin Glargine (LANTUS SOLOSTAR) 100 UNIT/ML Solostar Pen Inject 20 Units into the skin daily at 10 pm. 12/20/15   Kathie Dike, MD  Insulin Pen Needle 32G X 4 MM MISC 1 application by Does not apply route daily.  01/04/16   Wardell Honour, MD  Lancets [facility] ULTRASOFT) lancets Use as instructed; check sugar once daily 10/27/15   Wardell Honour, MD  mometasone (NASONEX) 50 MCG/ACT nasal spray Place 2 sprays into the nose daily. 01/20/15   Robyn Haber, MD  naproxen (NAPROSYN) 500 MG tablet Take 1 tablet (500 mg total) by mouth 2 (two) times daily. 02/26/16   Nat Christen, MD  omeprazole (PRILOSEC) 40 MG capsule Take 1 capsule (40 mg total) by mouth daily. 10/27/15   Wardell Honour, MD  fluticasone Asencion Islam) 50 MCG/ACT nasal spray Place 2 sprays into the nose daily. Patient not taking: Reported on 03/10/2014 03/08/12 03/10/14  Robyn Haber, MD     Exam: Current vital signs: BP 109/72   Pulse 81   Resp (!) 23   Ht 5\' 4"  ([id number] m)   Wt 83.3 kg   SpO2 98%   BMI 31.52 kg/m    Physical Exam  Constitutional: Appears well-developed and well-nourished.  Psych: Affect appropriate to situation Eyes: No scleral injection HENT: No OP obstrucion Head: Normocephalic.  Cardiovascular: Normal rate and regular rhythm.  Respiratory: Effort normal and breath sounds normal to anterior ascultation GI: Soft.  No distension. There is no tenderness.  Skin: WDI  Neuro: Mental Status: Patient is awake, alert, oriented to person, place, month, year, and situation. Patient is able to give a clear and coherent history. No signs of aphasia. She has severe neglect.  Cranial Nerves: II: Visual Fields are full. Pupils are equal, round, and reactive to light.   III,IV, VI: She has a right gaze preference but is able to cross midline to the left V: Facial sensation is diminished on the left VII: Facial movement with left facial weakness VIII: hearing is intact to voice Motor: Bulk is normal. 5/5 strength was present on the right, she has no voluntary movement on the left though she does have some tone in both the arm and leg, and does move it when she yawns. Sensory: Sensation is diminished on the  left Cerebellar: No clear ataxia on the right   I have reviewed labs in epic and the pertinent results are: Glucose 250, CMP otherwise unremarkable She has leukocytosis at 16.9  I have reviewed the images obtained: CT/CTP-  Primary Diagnosis:  Cerebral infarction due to embolism of  right middle cerebral artery.   Secondary Diagnosis: Cerebral edema, Type 2 diabetes mellitus with hyperglycemia , Obesity and Possible UTI   Impression: 48 year old female with right M1 occlusion likely secondary to an artery to artery embolism.  Due to unclear last known well, she is not a candidate for IV thrombolytics.  Likely, she is a candidate for  mechanical thrombectomy and she is being taken for this emergently.  She will be admitted to the intensive care unit for close monitoring following the procedure.  Etiology of leukocytosis is currently unclear, I will check a UA to rule out urinary tract infection.  Plan: Stroke- - HgbA1c, fasting lipid panel - MRI of the brain without contrast - Frequent neuro checks - Echocardiogram - Prophylactic therapy-depending on procedure - Risk factor modification - Telemetry monitoring - PT consult, OT consult, Speech consult - Stroke team to follow  Leukocytosis- -Urinalysis -CXR  Diabetes- -SSI  Continue home duloxetine  This patient is critically ill and at significant risk of neurological worsening, death and care requires constant monitoring of vital signs, hemodynamics,respiratory and cardiac monitoring, neurological assessment, discussion with family, other specialists and medical decision making of high complexity. I spent 45 minutes of neurocritical care time  in the care of  this patient. This was time spent independent of any time provided by nurse practitioner or PA.  Roland Rack, MD [facility] [phone]  If 7pm- 7am, please page neurology on call as listed in [place].

## 2019-03-02 NOTE — Procedures (Signed)
Patient Name: Ann Lopez  MRN: [id number]  Epilepsy Attending: Lora Havens  Referring Physician/Provider: Salvadore Dom, NP Date: 03/02/2019  Duration: 23.73mins  Patient history: 48yo F with Right MCA stroke and increased tone in left leg. EEG to evaluate for seizure.  Level of alertness: asleep/sedated  AEDs during EEG study: None  Technical aspects: This EEG study was done with scalp electrodes positioned according to the 10-20 International system of electrode placement. Electrical activity was acquired at a sampling rate of 500Hz  and reviewed with a high frequency filter of 70Hz  and a low frequency filter of 1Hz . EEG data were recorded continuously and digitally stored.   DESCRIPTION: EEG showed continuous generalized 2-3hz  delta slowing admixed with 15 to 18 Hz, 2-3 uV beta activity with irregular morphology distributed symmetrically and diffusely.  EEG was reactive to verbal stimuli. Hyperventilation and photic stimulation were not performed.  ABNORMALITY - Continuous slow, generalized  IMPRESSION: This study is suggestive of profound diffuse encephalopathy, non specific to etiology but could be secondary to sedation.  No seizures or epileptiform discharges were seen throughout the recording.

## 2019-03-02 NOTE — Progress Notes (Signed)
Portable EEG completed, results pending. 

## 2019-03-03 ENCOUNTER — Encounter (HOSPITAL_COMMUNITY): Payer: Self-pay | Admitting: Interventional Radiology

## 2019-03-03 ENCOUNTER — Inpatient Hospital Stay (HOSPITAL_COMMUNITY): Payer: Medicare Other

## 2019-03-03 DIAGNOSIS — I6389 Other cerebral infarction: Secondary | ICD-10-CM

## 2019-03-03 LAB — COMPREHENSIVE METABOLIC PANEL
ALT: 17 U/L (ref 0–44)
AST: 17 U/L (ref 15–41)
Albumin: 3.1 g/dL — ABNORMAL LOW (ref 3.5–5.0)
Alkaline Phosphatase: 47 U/L (ref 38–126)
Anion gap: 9 (ref 5–15)
BUN: 7 mg/dL (ref 6–20)
CO2: 21 mmol/L — ABNORMAL LOW (ref 22–32)
Calcium: 8.4 mg/dL — ABNORMAL LOW (ref 8.9–10.3)
Chloride: 112 mmol/L — ABNORMAL HIGH (ref 98–111)
Creatinine, Ser: 0.68 mg/dL (ref 0.44–1.00)
GFR calc Af Amer: >60 mL/min (ref >60)
GFR calc non Af Amer: >60 mL/min (ref >60)
Glucose, Bld: 122 mg/dL — ABNORMAL HIGH (ref 70–99)
Potassium: 3.2 mmol/L — ABNORMAL LOW (ref 3.5–5.1)
Sodium: 142 mmol/L (ref 135–145)
Total Bilirubin: 0.8 mg/dL (ref 0.3–1.2)
Total Protein: 5.9 g/dL — ABNORMAL LOW (ref 6.5–8.1)

## 2019-03-03 LAB — BASIC METABOLIC PANEL
Anion gap: 6 (ref 5–15)
BUN: 8 mg/dL (ref 6–20)
CO2: 22 mmol/L (ref 22–32)
Calcium: 8.3 mg/dL — ABNORMAL LOW (ref 8.9–10.3)
Chloride: 113 mmol/L — ABNORMAL HIGH (ref 98–111)
Creatinine, Ser: 0.61 mg/dL (ref 0.44–1.00)
GFR calc Af Amer: >60 mL/min (ref >60)
GFR calc non Af Amer: >60 mL/min (ref >60)
Glucose, Bld: 141 mg/dL — ABNORMAL HIGH (ref 70–99)
Potassium: 3.3 mmol/L — ABNORMAL LOW (ref 3.5–5.1)
Sodium: 141 mmol/L (ref 135–145)

## 2019-03-03 LAB — ECHOCARDIOGRAM COMPLETE
Height: 64 in
Weight: 2938.29 oz

## 2019-03-03 LAB — LIPID PANEL
Cholesterol: 166 mg/dL (ref 0–200)
HDL: 23 mg/dL — ABNORMAL LOW (ref >40)
LDL Cholesterol: UNDETERMINED mg/dL (ref 0–99)
Total CHOL/HDL Ratio: 7.2 RATIO
Triglycerides: 515 mg/dL — ABNORMAL HIGH (ref <150)
VLDL: UNDETERMINED mg/dL (ref 0–40)

## 2019-03-03 LAB — GLUCOSE, CAPILLARY
Glucose-Capillary: 160 mg/dL — ABNORMAL HIGH (ref 70–99)
Glucose-Capillary: 140 mg/dL — ABNORMAL HIGH (ref 70–99)
Glucose-Capillary: 112 mg/dL — ABNORMAL HIGH (ref 70–99)
Glucose-Capillary: 143 mg/dL — ABNORMAL HIGH (ref 70–99)
Glucose-Capillary: 193 mg/dL — ABNORMAL HIGH (ref 70–99)
Glucose-Capillary: 163 mg/dL — ABNORMAL HIGH (ref 70–99)

## 2019-03-03 LAB — CBC WITH DIFFERENTIAL/PLATELET
Abs Immature Granulocytes: 0.11 10*3/uL — ABNORMAL HIGH (ref 0.00–0.07)
Basophils Absolute: 0.1 10*3/uL (ref 0.0–0.1)
Basophils Relative: 0 %
Eosinophils Absolute: 0 10*3/uL (ref 0.0–0.5)
Eosinophils Relative: 0 %
HCT: 37.3 % (ref 36.0–46.0)
Hemoglobin: 12.4 g/dL (ref 12.0–15.0)
Immature Granulocytes: 1 %
Lymphocytes Relative: 15 %
Lymphs Abs: 3.1 10*3/uL (ref 0.7–4.0)
MCH: 29.7 pg (ref 26.0–34.0)
MCHC: 33.2 g/dL (ref 30.0–36.0)
MCV: 89.4 fL (ref 80.0–100.0)
Monocytes Absolute: 1.4 10*3/uL — ABNORMAL HIGH (ref 0.1–1.0)
Monocytes Relative: 7 %
Neutro Abs: 15.4 10*3/uL — ABNORMAL HIGH (ref 1.7–7.7)
Neutrophils Relative %: 77 %
Platelets: 222 10*3/uL (ref 150–400)
RBC: 4.17 MIL/uL (ref 3.87–5.11)
RDW: 14.3 % (ref 11.5–15.5)
WBC: 20 10*3/uL — ABNORMAL HIGH (ref 4.0–10.5)
nRBC: 0 % (ref 0.0–0.2)

## 2019-03-03 LAB — LDL CHOLESTEROL, DIRECT: Direct LDL: 48.7 mg/dL (ref 0–99)

## 2019-03-03 LAB — MAGNESIUM: Magnesium: 1.6 mg/dL — ABNORMAL LOW (ref 1.7–2.4)

## 2019-03-03 LAB — LACTIC ACID, PLASMA: Lactic Acid, Venous: 1.8 mmol/L (ref 0.5–1.9)

## 2019-03-03 LAB — PHOSPHORUS: Phosphorus: 2.8 mg/dL (ref 2.5–4.6)

## 2019-03-03 LAB — PLATELET INHIBITION P2Y12: Platelet Function  P2Y12: 15 [PRU] — ABNORMAL LOW (ref 182–335)

## 2019-03-03 LAB — HEMOGLOBIN A1C
Hgb A1c MFr Bld: 9 % — ABNORMAL HIGH (ref 4.8–5.6)
Mean Plasma Glucose: 211.6 mg/dL

## 2019-03-03 MED ORDER — POTASSIUM CHLORIDE 20 MEQ PO PACK
40.0000 meq | PACK | Freq: Two times a day (BID) | ORAL | Status: DC
  Filled 2019-03-03 (×2): qty 2

## 2019-03-03 MED ORDER — POTASSIUM CHLORIDE 20 MEQ PO PACK
40.0000 meq | PACK | Freq: Two times a day (BID) | ORAL | Status: AC
  Administered 2019-03-03 – 2019-03-04 (×2): 40 meq
  Filled 2019-03-03: qty 2

## 2019-03-03 MED ORDER — LORAZEPAM 2 MG/ML IJ SOLN
1.0000 mg | Freq: Once | INTRAMUSCULAR | Status: AC
  Administered 2019-03-03: 11:00:00 1 mg via INTRAVENOUS

## 2019-03-03 MED ORDER — LORAZEPAM 2 MG/ML IJ SOLN
1.0000 mg | Freq: Once | INTRAMUSCULAR | Status: AC
  Administered 2019-03-03: 1 mg via INTRAVENOUS

## 2019-03-03 MED ORDER — LORAZEPAM 2 MG/ML IJ SOLN
1.0000 mg | Freq: Once | INTRAMUSCULAR | Status: AC
  Administered 2019-03-03: 1 mg via INTRAVENOUS
  Filled 2019-03-03: qty 1

## 2019-03-03 MED ORDER — CHLORHEXIDINE GLUCONATE 0.12 % MT SOLN
15.0000 mL | Freq: Two times a day (BID) | OROMUCOSAL | Status: DC
  Administered 2019-03-03 – 2019-03-09 (×13): 15 mL via OROMUCOSAL
  Filled 2019-03-03 (×8): qty 15

## 2019-03-03 MED ORDER — QUETIAPINE FUMARATE 25 MG PO TABS
25.0000 mg | ORAL_TABLET | Freq: Once | ORAL | Status: AC
  Administered 2019-03-03: 25 mg
  Filled 2019-03-03: qty 1

## 2019-03-03 MED ORDER — INSULIN GLARGINE 100 UNIT/ML ~~LOC~~ SOLN
10.0000 [IU] | Freq: Every day | SUBCUTANEOUS | Status: DC
  Administered 2019-03-03: 10 [IU] via SUBCUTANEOUS
  Filled 2019-03-03 (×2): qty 0.1

## 2019-03-03 MED ORDER — PRO-STAT SUGAR FREE PO LIQD
30.0000 mL | Freq: Two times a day (BID) | ORAL | Status: DC
  Administered 2019-03-03 – 2019-03-04 (×3): 30 mL
  Filled 2019-03-03 (×4): qty 30

## 2019-03-03 MED ORDER — HALOPERIDOL LACTATE 5 MG/ML IJ SOLN
2.0000 mg | Freq: Once | INTRAMUSCULAR | Status: AC | PRN
  Administered 2019-03-03: 2 mg via INTRAVENOUS
  Filled 2019-03-03: qty 1

## 2019-03-03 MED ORDER — VITAL HIGH PROTEIN PO LIQD
1000.0000 mL | ORAL | Status: DC
  Administered 2019-03-03: 1000 mL

## 2019-03-03 MED ORDER — ORAL CARE MOUTH RINSE
15.0000 mL | Freq: Two times a day (BID) | OROMUCOSAL | Status: DC
  Administered 2019-03-03 – 2019-03-09 (×7): 15 mL via OROMUCOSAL

## 2019-03-03 MED ORDER — LORAZEPAM 2 MG/ML IJ SOLN
INTRAMUSCULAR | Status: AC
  Administered 2019-03-03: 1 mg via INTRAVENOUS
  Filled 2019-03-03: qty 1

## 2019-03-03 NOTE — Progress Notes (Signed)
NAME:  Ann Lopez, MRN:  [id number], DOB:  06/29/1970, LOS: 1 ADMISSION DATE:  03/02/2019, CONSULTATION DATE:  03/02/2019 REFERRING MD:  Roland Rack, MD CHIEF COMPLAINT:  Hypotension s/p R MCA embolectomy   Brief History   48 y/o female with PMH as below presented to ED 11/23 with left hemiparesis, left-sided neglect, and right gaze, NIHSS 12 --> IR MCA occlusion s/p embolectomy.  History of present illness   This 48 y/o female with PMH as below presented via EMS to the ED 11/23 am with left hemiparesis, left-sided neglect, and right gaze, NIHSS 12.  She was last known normal at 2300 11/22, awoke in am 11/23 and was able to ambulate to the bathroom but then could not stand from the toilet and fell to the ground.  She was taken to IR for emergent R MCA embolectomy.  When she arrived in 4N ICU, she was minimally responsive, exhibited seizure-like activity upon stimulation, and was hypotensive requiring pressor support.  Past Medical History  Anxiety/Depression, Asthma,Breast CA 2009 s/p bilat mastectomy Type 2 DM,HTN,HLD,Osteopenia,Gastric bypass 2013,GERD IBS Significant Hospital Events     Consults:  11/23 Neuro 11/23 PCCM  Procedures:  11/23 MCA embolectomy  Significant Diagnostic Tests:  11/23 CT Head w/o contrast> no hemorrhage 11/23 CTA/CTP >There is occlusive or near occlusive thrombus within the right ICA terminus with occlusive/near occlusive thrombus extending into the M1 right middle cerebral artery and A1 right anterior cerebral artery. There is some reconstitution of flow within proximal M2 and more distal right MCA branches. However, there are additional high-grade stenoses within proximal to mid right M2 branches, likely reflecting additional thrombus. 11/23 stat CT brain>>> occlusive thrombus within the right ICA terminus, extending to the M1 right middle cerebral artery and A1 right anterior cerebral artery 11/24 MRI brain>>> 11/23 EEG>>> consistent with  profound diffuse encephalopathic nonspecific Micro Data:  SARS-CoV-2 negative  Antimicrobials:     Interim history/subjective:  Unable to obtain.  Objective   Blood pressure (!) 129/105, pulse 68, temperature 98.7 F (37.1 C), temperature source Axillary, resp. rate 20, height 5\' 4"  (1.626 m), weight 83.3 kg, SpO2 93 %.        Intake/Output Summary (Last 24 hours) at 03/03/2019 0815 Last data filed at 03/03/2019 0600 Gross per 24 hour  Intake 5001.99 ml  Output 2475 ml  Net 2526.99 ml   Filed Weights   03/02/19 0800 03/02/19 0905  Weight: 83.3 kg 83.3 kg    Examination: General: Well-nourished well-developed female HEENT: MM pink/moist, pupils equal reactive light, extraocular movement is unremarkable Neuro: Follows commands moves all extremities CV: Heart sounds are regular regular rate rhythm sinus bradycardia PULM: Diminished in the bases currently on room air with sats 96% Vent none FIO2 21% GI: soft, bsx4 active  GU: Foley with yellow urine Extremities: warm/dry,  edema  Skin: no rashes or lesions   Resolved Hospital Problem list     Assessment & Plan:  Acute metabolic enecphalopathy after CVA - right M1 occlusion. Now s/p thrombectomy 11/23 R/o seizure Seizure-like activity with stimulation->appears more like myoclonus Plan Per neurology EEG Echocardiogram (per the stroke team) Systolic blood pressure goal 120-140 per interventional radiology Currently intensive care unit Pending MRI of the brain We will need swallowing evaluation  Hypotension. Etiology unclear. ? pharmacological from residual anesthesia. ? Neurogenic (seems unlikely) Plan Wean Neo-Synephrine for systolic blood pressure goal of 1 20-1 40 Hold diuretics and home antihypertensives   Non-anion gap Metabolic acidosis  Plan No chemistries as  of 03/03/2019 We will repeat chemistries check lactic acid I suspect this is has resolved. Follow serial chemistries for in a.m. also  03/04/2019  HLD Plan Continue home [facility]  Type 2 DM w/ hyperglycemia A1C 9.2 Plan Sliding-scale insulin protocol Resume home nocturnal of Lantus 20 units nightly will decrease to 10 units at this time and increase as needed.  GERD Plan Continue home Prilosec  Hx anxiety/depression Plan Continue home Prozac  Best practice:  Diet: NPO Pain/Anxiety/Delirium protocol (if indicated): NA VAP protocol (if indicated): NA  DVT prophylaxis: SCD GI prophylaxis: PPI Glucose control: ssi Mobility: BR Code Status: full code  Family Communication: 03/03/2019 patient updated at bedside Disposition:  ICU  Labs   CBC: Recent Labs  Lab 03/02/19 0823 03/02/19 0826 03/02/19 1251 03/02/19 1254  WBC 16.9*  --  22.4*  --   NEUTROABS 14.0*  --   --   --   HGB 14.9 15.3* 14.2 13.6  HCT 44.6 45.0 41.4 40.0  MCV 89.0  --  87.3  --   PLT 235  --  289  --     Basic Metabolic Panel: Recent Labs  Lab 03/02/19 0823 03/02/19 0826 03/02/19 1254 03/02/19 1255  NA 135 136 138 137  K 4.1 4.1 3.9 4.0  CL 103 103  --  109  CO2 23  --   --  18*  GLUCOSE 250* 247*  --  255*  BUN 8 8  --  6  CREATININE 0.53 0.50  --  0.64  CALCIUM 8.8*  --   --  7.9*   GFR: Estimated Creatinine Clearance: 91.7 mL/min (by C-G formula based on SCr of 0.64 mg/dL). Recent Labs  Lab 03/02/19 0823 03/02/19 1251 03/02/19 1432  WBC 16.9* 22.4*  --   LATICACIDVEN  --   --  3.4*    Liver Function Tests: Recent Labs  Lab 03/02/19 0823  AST 8*  ALT 17  ALKPHOS 50  BILITOT 0.7  PROT 6.7  ALBUMIN 3.6   No results for input(s): LIPASE, AMYLASE in the last 168 hours. No results for input(s): AMMONIA in the last 168 hours.  ABG    Component Value Date/Time   PHART 7.282 (L) 03/02/2019 1254   PCO2ART 39.9 03/02/2019 1254   PO2ART 216.0 (H) 03/02/2019 1254   HCO3 18.9 (L) 03/02/2019 1254   TCO2 20 (L) 03/02/2019 1254   ACIDBASEDEF 8.0 (H) 03/02/2019 1254   O2SAT 100.0 03/02/2019 1254      Coagulation Profile: Recent Labs  Lab 03/02/19 0823  INR 1.0    Cardiac Enzymes: Recent Labs  Lab 03/02/19 1255  CKTOTAL 65  CKMB 1.8    HbA1C: Hemoglobin A1C  Date/Time Value Ref Range Status  10/27/2015 07:22 PM 11.1  Final  07/28/2015 05:01 PM 11.3  Final   Hgb A1c MFr Bld  Date/Time Value Ref Range Status  03/02/2019 08:23 AM 9.2 (H) 4.8 - 5.6 % Final    Comment:    (NOTE) Pre diabetes:          5.7%-6.4% Diabetes:              >6.4% Glycemic control for   <7.0% adults with diabetes   11/08/2012 02:26 PM 9.2 (H) <5.7 % Final    Comment:    (NOTE)  According to the ADA Clinical Practice Recommendations for 2011, when HbA1c is used as a screening test:  >=6.5%   Diagnostic of Diabetes Mellitus           (if abnormal result is confirmed) 5.7-6.4%   Increased risk of developing Diabetes Mellitus References:Diagnosis and Classification of Diabetes Mellitus,Diabetes [id number] 1):S62-S69 and Standards of Medical Care in         Diabetes - 2011,Diabetes [id number] (Suppl 1):S11-S61.    CBG: Recent Labs  Lab 03/02/19 1610 03/02/19 1937 03/02/19 2311 03/03/19 0304 03/03/19 0737  GLUCAP 288* 188* 181* 160* 140*      Critical care time: 30 min      Richardson Landry Jabarri Stefanelli ACNP Acute Care Nurse Practitioner [place] Please consult Amion 03/03/2019, 8:15 AM

## 2019-03-03 NOTE — Progress Notes (Signed)
  Echocardiogram 2D Echocardiogram was attempted but the patient was in MRI. We will try again later.   [name] 03/03/2019, 10:50 AM

## 2019-03-03 NOTE — Progress Notes (Signed)
PT Cancellation Note  Patient Details Name: Ann Lopez MRN: [id number] DOB: 1971-02-29   Cancelled Treatment:    Reason Eval/Treat Not Completed: Patient not medically ready.  Patient not medically ready Rn requesting to hold at this time due to ativan and hadol provided to patient. 03/03/2019  Donnella Sham, [place] [phone]  (pager) [phone]  (office)   Tessie Fass Clementine Soulliere 03/03/2019, 3:10 PM

## 2019-03-03 NOTE — Progress Notes (Signed)
  Echocardiogram 2D Echocardiogram has been performed.  [name] 03/03/2019, 2:15 PM

## 2019-03-03 NOTE — Progress Notes (Addendum)
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 Estanislado Pandy. Patient laying in bed resting comfortably. She responds to voice, answers questions appropriately, and follows simple commands. Moving all extremities with minimal movements of LUE (can lift a few inches off bed but cannot straighten arm/fingers or lift arm to chest level). Right groin incision c/d/i.   Allergies: Azithromycin, Morphine and related, Nitrofurantoin, Penicillins, and Sulfonamide derivatives  Medications: Prior to Admission medications   Medication Sig Start Date End Date Taking? Authorizing Provider  albuterol (PROVENTIL HFA;VENTOLIN HFA) 108 (90 Base) MCG/ACT inhaler Inhale 1 puff into the lungs every 6 (six) hours as needed for wheezing or shortness of breath. 07/28/15   Robyn Haber, MD  albuterol (PROVENTIL) (2.5 MG/3ML) 0.083% nebulizer solution Take 3 mLs (2.5 mg total) by nebulization every 6 (six) hours as needed for wheezing or shortness of breath. [id number]   Delora Fuel, MD  aspirin EC 81 MG tablet Take 1 tablet (81 mg total) by mouth daily. 12/20/15   Kathie Dike, MD  canagliflozin (INVOKANA) 300 MG TABS tablet Take 300 mg by mouth daily before breakfast.    [provider]  Choline Fenofibrate (FENOFIBRIC ACID) 135 MG CPDR Take 1 capsule by mouth daily. 02/13/19   [provider]  clonazePAM (KLONOPIN) 0.5 MG tablet Take 0.25 mg by mouth 2 (two) times daily as needed for anxiety.  11/13/18   [provider]  divalproex (DEPAKOTE ER) 250 MG 24  hr tablet Take 250 mg by mouth 2 (two) times daily. 12/31/18   [provider]  DULoxetine (CYMBALTA) 60 MG capsule Take 2 capsules (120 mg total) by mouth daily. Patient taking differently: Take 60 mg by mouth daily.  12/06/15   Wardell Honour, MD  fenofibrate (TRICOR) 145 MG tablet Take 1 tablet (145 mg total) by mouth daily. Patient not taking: Reported on 03/02/2019 04/13/16   Wardell Honour, MD  furosemide (LASIX) 20 MG tablet Take 20 mg by mouth. 02/19/19   [provider]  glucose blood (CHOICE DM FORA G20 TEST STRIPS) test strip Use as instructed 10/27/15   Wardell Honour, MD  HYDROcodone-acetaminophen [facility]) 10-325 MG tablet Take 1 tablet by mouth every 6 (six) hours as needed. 02/03/19   [provider]  HYDROcodone-acetaminophen (NORCO) 7.5-325 MG tablet Take 1 tablet by mouth 2 (two) times daily as needed for moderate pain. Patient not taking: Reported on 03/02/2019 01/04/16   Wardell Honour, MD  hydrOXYzine (ATARAX/VISTARIL) 10 MG tablet Take 10 mg by mouth 3 (three) times daily. 02/24/19   [provider]  ibuprofen (ADVIL,MOTRIN) 200 MG tablet Take 800 mg by mouth every 6 (six) hours as needed for fever. Reported on 07/28/2015    [provider]  Insulin Glargine (LANTUS SOLOSTAR) 100 UNIT/ML Solostar Pen Inject 20 Units into the skin daily at 10 pm. 12/20/15   Kathie Dike, MD  Insulin Pen Needle 32G X 4 MM MISC 1 application by Does not apply route daily. 01/04/16   Wardell Honour, MD  Lancets [facility] ULTRASOFT) lancets Use as instructed; check sugar once daily 10/27/15   Wardell Honour, MD  mometasone (NASONEX) 50 MCG/ACT nasal spray Place 2 sprays into the nose daily. 01/20/15   Robyn Haber, MD  naproxen (NAPROSYN) 500 MG tablet Take 1 tablet (500 mg total) by mouth 2 (two) times daily. 02/26/16   Nat Christen, MD  omeprazole (PRILOSEC) 40 MG capsule Take 1 capsule (40 mg total) by mouth daily. 10/27/15   Wardell Honour, MD    fluticasone Asencion Islam) 50 MCG/ACT nasal spray Place 2 sprays into the nose daily. Patient not taking: Reported on 03/10/2014 03/08/12 03/10/14  Robyn Haber, MD     Vital Signs: BP (!) 129/105    Pulse 68    Temp 98.8 F (37.1 C) (Axillary)    Resp 20    Ht 5\' 4"  (1.626 m)    Wt 183 lb 10.3 oz (83.3 kg)    SpO2 93%    BMI 31.52 kg/m   Physical Exam Vitals signs and nursing note reviewed.  Constitutional:      General: She is not in acute distress.    Appearance: Normal appearance.  Pulmonary:     Effort: Pulmonary effort is normal. No respiratory distress.  Skin:    General: Skin is warm and dry.     Comments: Right groin incision soft without active bleeding or hematoma.  Neurological:     Mental Status: She is alert.     Comments: Alert, awake, and oriented x3. Speech and comprehension intact. PERRL bilaterally. No facial asymmetry. Moving all extremities with minimal movements of LUE (can lift a few inches off bed but cannot straighten arm/fingers or lift arm to chest level). Distal pulses- DPs 2+ bilaterally, PTs palpable bilaterally with Doppler.     Imaging: Ct Angio Head W Or Wo Contrast  Result Date: 03/02/2019 CLINICAL DATA:  Stroke code.  Left-sided weakness EXAM: CT ANGIOGRAPHY HEAD AND NECK CT PERFUSION BRAIN TECHNIQUE: Multidetector CT imaging of the head and neck was performed using the standard protocol during bolus administration of intravenous contrast. Multiplanar CT image reconstructions and MIPs were obtained to evaluate the vascular anatomy. Carotid stenosis measurements (when applicable) are obtained utilizing NASCET criteria, using the distal internal carotid diameter as the denominator. Multiphase CT imaging of the brain was performed following IV bolus contrast injection. Subsequent parametric perfusion maps were calculated using RAPID software. CONTRAST:  148mL OMNIPAQUE IOHEXOL 350 MG/ML SOLN COMPARISON:  Noncontrast head CT performed earlier the same  day. FINDINGS: CTA NECK FINDINGS Aortic arch: Standard branching. Right carotid system: CCA widely patent. Abnormal hypodensity within the origin of the right ICA and within the right carotid bulb, likely reflecting a combination of soft plaque and adherent thrombus with resultant high-grade stenosis. Distal to this, the right ICA is asymmetrically small in caliber within the neck, but patent. Left carotid system: CCA widely patent. Mild soft plaque at the carotid bifurcation and within the proximal ICA without significant stenosis of the proximal ICA (50% or greater). Distal to this, the left ICA is widely patent within the neck without stenosis. Vertebral arteries: Codominant. Streak artifact from a dense left-sided contrast bolus somewhat limits evaluation of the V1 left vertebral artery. Within this limitation, the bilateral vertebral arteries are patent throughout the neck without significant stenosis (50% or greater). Skeleton: No acute bony abnormality. Congenital C3-C4 vertebral body fusion Other neck: No soft tissue neck mass or pathologically enlarged cervical chain lymph nodes. 1.7 cm nodule within the right thyroid lobe. Upper chest: No consolidation within the visualized lung apices. Review of the MIP images confirms the above findings  CTA HEAD FINDINGS Anterior circulation: The right internal carotid artery is patent through the siphon region. There is occlusive or near occlusive thrombus within the right ICA terminus with occlusive/near occlusive thrombus extending into the M1 right middle cerebral artery and A1 right anterior cerebral artery. There is some reconstitution of flow within proximal M2 and more distal right MCA branches. However, there are additional high-grade stenoses within proximal to mid right M2 branches, likely reflecting additional thrombus Reconstitution of flow within the distal A1 right ACA, likely from cross flow via the anterior communicating artery. The A1 left ACA is patent  without significant stenosis. The A2 and more distal anterior cerebral arteries are patent. The intracranial left internal carotid arteries patent without significant stenosis. The left middle cerebral artery is patent without significant proximal stenosis. No intracranial aneurysm is identified. Posterior circulation: The intracranial vertebral arteries are patent without significant stenosis, as is the basilar artery. The bilateral posterior cerebral arteries are patent without significant proximal stenosis. A sizable right posterior communicating artery is present and patent, although the direction of flow within this vessel is uncertain. Venous sinuses: Within limitations of contrast timing, no convincing thrombus. Anatomic variants: As described. Review of the MIP images confirms the above findings CT Brain Perfusion Findings: CBF (<30%) Volume: 72mL Perfusion (Tmax>6.0s) volume: 15mL Mismatch Volume: 53mL Infarction Location: Right MCA vascular territory IMPRESSION: CTA neck: 1. Prominent abnormal hypodensity within the origin of the right internal carotid artery and right carotid bulb, likely reflecting a combination of soft plaque and adherent thrombus. Resultant high-grade stenosis at this site. 2. Left common and internal carotid arteries patent within the neck without significant stenosis. 3. Bilateral vertebral arteries patent within the neck without significant stenosis. 4. 1.7 cm left thyroid lobe nodule. Nonemergent thyroid ultrasound recommended for further evaluation. CTA head: 1. Occlusive/near occlusive thrombus within the right ICA terminus, extending into the M1 right middle cerebral artery and A1 right anterior cerebral artery. There is some reconstitution of flow within proximal M2 and more distal right MCA branches. However, there are additional high-grade stenoses within proximal to mid right M2 branches, likely reflecting additional thrombus. Opacification of the distal A1 right ACA and more  distal right ACA, likely from cross flow via the anterior communicating artery. 2. No other intracranial large vessel occlusion or proximal high-grade arterial stenosis. CT perfusion head: 5 mL core infarct within the right MCA vascular territory. 70 mL region of hypoperfused parenchyma within the right MCA vascular territory. Reported mismatch volume 65 mL. Electronically Signed   By: Kellie Simmering DO   On: 03/02/2019 09:31   Dg Abd 1 View  Result Date: 03/02/2019 CLINICAL DATA:  NG tube placement EXAM: ABDOMEN - 1 VIEW COMPARISON:  None. FINDINGS: Esophageal tube tip overlies the gastric body. Nonobstructed bowel-gas pattern IMPRESSION: Esophageal tube tip overlies the gastric body Electronically Signed   By: Donavan Foil M.D.   On: 03/02/2019 19:04   Ct Head Wo Contrast  Result Date: 03/02/2019 CLINICAL DATA:  Status post revascularization of the occluded right ICA terminus. EXAM: CT HEAD WITHOUT CONTRAST TECHNIQUE: Contiguous axial images were obtained from the base of the skull through the vertex without intravenous contrast. COMPARISON:  CT head and CT perfusion 03/02/2019 FINDINGS: Brain: There is contrast staining within the core infarct area of the right lentiform nucleus. There is also some contrast staining within the body of the right caudate lobe. No other focal areas of contrast enhancement is present. Gray-white differentiation is otherwise normal. Acute hemorrhage or  mass lesion is present. There is some mass effect on the right lateral ventricle. Vascular: No hyperdense vessel or unexpected calcification.  The Skull: Calvarium is intact. No focal lytic or blastic lesions are present. Sinuses/Orbits: Mild mucosal thickening is present inferior right maxillary sinus. Is scattered ethmoid opacification and some mucosal thickening in the inferior right frontal sinus. No fluid levels are present. Mastoid air cells are clear. Globes and orbits are within normal limits. IMPRESSION: 1. Contrast  staining in the right lentiform nucleus and caudate following revascularization. This is consistent with areas infarction. 2. Remainder of right MCA territory is within normal limits. 3. No acute hemorrhage. 4. Mild sinus disease. Electronically Signed   By: San Morelle M.D.   On: 03/02/2019 14:08   Ct Angio Neck W Or Wo Contrast  Result Date: 03/02/2019 CLINICAL DATA:  Stroke code.  Left-sided weakness. EXAM: CT ANGIOGRAPHY HEAD AND NECK CT PERFUSION BRAIN TECHNIQUE: Multidetector CT imaging of the head and neck was performed using the standard protocol during bolus administration of intravenous contrast. Multiplanar CT image reconstructions and MIPs were obtained to evaluate the vascular anatomy. Carotid stenosis measurements (when applicable) are obtained utilizing NASCET criteria, using the distal internal carotid diameter as the denominator. Multiphase CT imaging of the brain was performed following IV bolus contrast injection. Subsequent parametric perfusion maps were calculated using RAPID software. CONTRAST:  Intravenous contrast given not known at this time COMPARISON:  Noncontrast head CT FINDINGS: CTA NECK FINDINGS Aortic arch: Standard branching. Right carotid system: CCA widely patent. Abnormal hypodensity within the origin of the right ICA and within the right carotid bulb, likely reflecting a combination of soft plaque and adherent thrombus with resultant high-grade stenosis. Distal to this, the right ICA is asymmetrically small in caliber within the neck, but patent. Left carotid system: CCA widely patent. Mild soft plaque at the carotid bifurcation and within the proximal ICA without significant stenosis of the proximal ICA (50% or greater). Distal to this, the left ICA is widely patent within the neck without stenosis. Vertebral arteries: Codominant. Streak artifact from a dense left-sided contrast bolus somewhat limits evaluation of the V1 left vertebral artery. Within this  limitation, the bilateral vertebral arteries are patent throughout the neck without significant stenosis (50% or greater). Skeleton: No acute bony abnormality. Congenital C3-C4 vertebral body fusion Other neck: No soft tissue neck mass or pathologically enlarged cervical chain lymph nodes. 1.7 cm nodule within the right thyroid lobe. Upper chest: No consolidation within the visualized lung apices. Review of the MIP images confirms the above findings CTA HEAD FINDINGS Anterior circulation: The right internal carotid artery is patent through the siphon region. There is occlusive or near occlusive thrombus within the right ICA terminus with occlusive/near occlusive thrombus extending into the M1 right middle cerebral artery and A1 right anterior cerebral artery. There is some reconstitution of flow within proximal M2 and more distal right MCA branches. However, there are additional high-grade stenoses within proximal to mid right M2 branches, likely reflecting additional thrombus Reconstitution of flow within the distal A1 right ACA, likely from cross flow via the anterior communicating artery. The A1 left ACA is patent without significant stenosis. The A2 and more distal anterior cerebral arteries are patent. The intracranial left internal carotid arteries patent without significant stenosis. The left middle cerebral artery is patent without significant proximal stenosis. No intracranial aneurysm is identified. Posterior circulation: The intracranial vertebral arteries are patent without significant stenosis, as is the basilar artery. The bilateral posterior cerebral arteries  are patent without significant proximal stenosis. A sizable right posterior communicating artery is present and patent, although the direction of flow within this vessel is uncertain. Venous sinuses: Within limitations of contrast timing, no convincing thrombus. Anatomic variants: As described. Review of the MIP images confirms the above findings  CT Brain Perfusion Findings: CBF (<30%) Volume: 33mL Perfusion (Tmax>6.0s) volume: 18mL Mismatch Volume: 71mL Infarction Location:Right MCA vascular territory These results were called by telephone at the time of interpretation on 03/02/2019 at 8:46 am to provider [facility] , who verbally acknowledged these results. IMPRESSION: CTA neck: 1. Prominent abnormal hypodensity within the origin of the right internal carotid artery and right carotid bulb, likely reflecting a combination of soft plaque and adherent thrombus. Resultant high-grade stenosis at this site. 2. Left common and internal carotid arteries patent within the neck without significant stenosis. 3. Bilateral vertebral arteries patent within the neck without significant stenosis. 4. 1.7 cm left thyroid lobe nodule. Nonemergent thyroid ultrasound recommended for further evaluation. CTA head: 1. Occlusive/near occlusive thrombus within the right ICA terminus, extending into the M1 right middle cerebral artery and A1 right anterior cerebral artery. There is some reconstitution of flow within proximal M2 and more distal right MCA branches. However, there are additional high-grade stenoses within proximal to mid right M2 branches, likely reflecting additional thrombus. Opacification of the distal A1 right ACA and more distal right ACA, likely from cross flow via the anterior communicating artery. 2. No other intracranial large vessel occlusion or proximal high-grade arterial stenosis. CT perfusion head: 5 mL core infarct within the right MCA vascular territory. 70 mL region of hypoperfused parenchyma within the right MCA vascular territory. Reported mismatch volume 65 mL. Electronically Signed   By: Kellie Simmering DO   On: 03/02/2019 09:08   Ct Cerebral Perfusion W Contrast  Result Date: 03/02/2019 CLINICAL DATA:  Stroke code.  Left-sided weakness EXAM: CT ANGIOGRAPHY HEAD AND NECK CT PERFUSION BRAIN TECHNIQUE: Multidetector CT imaging of the head  and neck was performed using the standard protocol during bolus administration of intravenous contrast. Multiplanar CT image reconstructions and MIPs were obtained to evaluate the vascular anatomy. Carotid stenosis measurements (when applicable) are obtained utilizing NASCET criteria, using the distal internal carotid diameter as the denominator. Multiphase CT imaging of the brain was performed following IV bolus contrast injection. Subsequent parametric perfusion maps were calculated using RAPID software. CONTRAST:  135mL OMNIPAQUE IOHEXOL 350 MG/ML SOLN COMPARISON:  Noncontrast head CT performed earlier the same day. FINDINGS: CTA NECK FINDINGS Aortic arch: Standard branching. Right carotid system: CCA widely patent. Abnormal hypodensity within the origin of the right ICA and within the right carotid bulb, likely reflecting a combination of soft plaque and adherent thrombus with resultant high-grade stenosis. Distal to this, the right ICA is asymmetrically small in caliber within the neck, but patent. Left carotid system: CCA widely patent. Mild soft plaque at the carotid bifurcation and within the proximal ICA without significant stenosis of the proximal ICA (50% or greater). Distal to this, the left ICA is widely patent within the neck without stenosis. Vertebral arteries: Codominant. Streak artifact from a dense left-sided contrast bolus somewhat limits evaluation of the V1 left vertebral artery. Within this limitation, the bilateral vertebral arteries are patent throughout the neck without significant stenosis (50% or greater). Skeleton: No acute bony abnormality. Congenital C3-C4 vertebral body fusion Other neck: No soft tissue neck mass or pathologically enlarged cervical chain lymph nodes. 1.7 cm nodule within the right thyroid lobe. Upper chest:  No consolidation within the visualized lung apices. Review of the MIP images confirms the above findings CTA HEAD FINDINGS Anterior circulation: The right internal  carotid artery is patent through the siphon region. There is occlusive or near occlusive thrombus within the right ICA terminus with occlusive/near occlusive thrombus extending into the M1 right middle cerebral artery and A1 right anterior cerebral artery. There is some reconstitution of flow within proximal M2 and more distal right MCA branches. However, there are additional high-grade stenoses within proximal to mid right M2 branches, likely reflecting additional thrombus Reconstitution of flow within the distal A1 right ACA, likely from cross flow via the anterior communicating artery. The A1 left ACA is patent without significant stenosis. The A2 and more distal anterior cerebral arteries are patent. The intracranial left internal carotid arteries patent without significant stenosis. The left middle cerebral artery is patent without significant proximal stenosis. No intracranial aneurysm is identified. Posterior circulation: The intracranial vertebral arteries are patent without significant stenosis, as is the basilar artery. The bilateral posterior cerebral arteries are patent without significant proximal stenosis. A sizable right posterior communicating artery is present and patent, although the direction of flow within this vessel is uncertain. Venous sinuses: Within limitations of contrast timing, no convincing thrombus. Anatomic variants: As described. Review of the MIP images confirms the above findings CT Brain Perfusion Findings: CBF (<30%) Volume: 69mL Perfusion (Tmax>6.0s) volume: 80mL Mismatch Volume: 21mL Infarction Location: Right MCA vascular territory IMPRESSION: CTA neck: 1. Prominent abnormal hypodensity within the origin of the right internal carotid artery and right carotid bulb, likely reflecting a combination of soft plaque and adherent thrombus. Resultant high-grade stenosis at this site. 2. Left common and internal carotid arteries patent within the neck without significant stenosis. 3.  Bilateral vertebral arteries patent within the neck without significant stenosis. 4. 1.7 cm left thyroid lobe nodule. Nonemergent thyroid ultrasound recommended for further evaluation. CTA head: 1. Occlusive/near occlusive thrombus within the right ICA terminus, extending into the M1 right middle cerebral artery and A1 right anterior cerebral artery. There is some reconstitution of flow within proximal M2 and more distal right MCA branches. However, there are additional high-grade stenoses within proximal to mid right M2 branches, likely reflecting additional thrombus. Opacification of the distal A1 right ACA and more distal right ACA, likely from cross flow via the anterior communicating artery. 2. No other intracranial large vessel occlusion or proximal high-grade arterial stenosis. CT perfusion head: 5 mL core infarct within the right MCA vascular territory. 70 mL region of hypoperfused parenchyma within the right MCA vascular territory. Reported mismatch volume 65 mL. Electronically Signed   By: Kellie Simmering DO   On: 03/02/2019 09:31   Ct Head Code Stroke Wo Contrast  Result Date: 03/02/2019 CLINICAL DATA:  Code stroke. Altered level of consciousness (LOC), unexplained. Last known normal 2300, left-sided weakness. EXAM: CT HEAD WITHOUT CONTRAST TECHNIQUE: Contiguous axial images were obtained from the base of the skull through the vertex without intravenous contrast. COMPARISON:  Brain MRI 12/19/2015, head CT 12/19/2015 FINDINGS: Brain: Moderate to severe motion degradation, significantly limiting evaluation. No acute intracranial hemorrhage or acute demarcated cortical infarct definitively identified. No intracranial mass identified. No midline shift. No extra-axial fluid collection identified. Vascular: No hyperdense vessel identified Skull: No calvarial fracture or suspicious osseous lesion identified. Sinuses/Orbits: Globes unremarkable within described limitations. Ethmoid sinus mucosal thickening. No  significant mastoid effusion. ASPECTS [facility] Stroke Program Early CT Score) difficult to report with confidence given degree of motion degradation. These results  were communicated to Dr. Leonel Ramsay At 8:36 amon 11/23/2020by text page via the [facility] messaging system. IMPRESSION: 1. Moderate to severe motion degradation significantly limits evaluation. 2. No acute intracranial hemorrhage or demarcated cortical infarction definitively identified. Electronically Signed   By: Kellie Simmering DO   On: 03/02/2019 08:36    Labs:  CBC: Recent Labs    03/02/19 0823 03/02/19 0826 03/02/19 1251 03/02/19 1254  WBC 16.9*  --  22.4*  --   HGB 14.9 15.3* 14.2 13.6  HCT 44.6 45.0 41.4 40.0  PLT 235  --  289  --     COAGS: Recent Labs    03/02/19 0823  INR 1.0  APTT 27    BMP: Recent Labs    03/02/19 0823 03/02/19 0826 03/02/19 1254 03/02/19 1255  NA 135 136 138 137  K 4.1 4.1 3.9 4.0  CL 103 103  --  109  CO2 23  --   --  18*  GLUCOSE 250* 247*  --  255*  BUN 8 8  --  6  CALCIUM 8.8*  --   --  7.9*  CREATININE 0.53 0.50  --  0.64  GFRNONAA >60  --   --  >60  GFRAA >60  --   --  >60    LIVER FUNCTION TESTS: Recent Labs    03/02/19 0823  BILITOT 0.7  AST 8*  ALT 17  ALKPHOS 50  PROT 6.7  ALBUMIN 3.6    Assessment and Plan:  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 Estanislado Pandy. Patient's condition improving- more responsive today, A&Ox3, moving all extremities with minimal movements of LUE (can lift a few inches off bed but cannot straighten arm/fingers or lift arm to chest level). Right groin incision soft, distal pulses intact bilaterally. Continue taking Brilinta 90 mg twice daily and Aspirin 81 mg once daily-  will obtain P2Y12 today to check effectiveness of DAPT. Plan for MRI/MRA brain/head today. Plan to follow-up with Dr. Estanislado Pandy in clinic 4 weeks after discharge- order placed to facilitate this. Further plans per neurology/CCM- appreciate and agree with management.  NIR to follow.   Electronically Signed: Earley Abide, PA-C 03/03/2019, 8:57 AM   I spent a total of 25 Minutes at the the patient's bedside AND on the patient's hospital floor or unit, greater than 50% of which was counseling/coordinating care for right ICA terminus occlusion, right MCA occlusion, right ACA occlusion, and proximal right ICA stenosis s/p revascularization.

## 2019-03-03 NOTE — Progress Notes (Signed)
OT Cancellation Note  Patient Details Name: Ann Lopez MRN: [id number] DOB: 09/23/1970   Cancelled Treatment:    Reason Eval/Treat Not Completed: Patient not medically ready Rn requesting to hold at this time due to ativan and hadol provided to patient.   Billey Chang, OTR/L  Acute Rehabilitation Services Pager: [phone] Office: [phone] .  03/03/2019, 3:03 PM

## 2019-03-03 NOTE — Evaluation (Addendum)
Clinical/Bedside Swallow Evaluation Patient Details  Name: Ann Lopez MRN: [id number] Date of Birth: May 03, 1970  Today's Date: 03/03/2019 Time: SLP Start Time (ACUTE ONLY): 1225 SLP Stop Time (ACUTE ONLY): 1237 SLP Time Calculation (min) (ACUTE ONLY): 12 min  Past Medical History:  Past Medical History:  Diagnosis Date  . Allergy   . Anxiety   . Asthma   . Back pain   . Breast cancer ([place])   . Cancer ([place])   . Depression   . Diabetes mellitus   . Headache   . Hyperlipemia   . Osteopenia   . Osteoporosis    osteopenia   Past Surgical History:  Past Surgical History:  Procedure Laterality Date  . ABDOMINAL HYSTERECTOMY     B oophorectomy for BRCA1 gene  . BREAST SURGERY    . CESAREAN SECTION    . INCISION AND DRAINAGE ABSCESS Left 11/14/2012   Procedure: INCISION AND DRAINAGE ABSCESS LEFT GROIN;  Surgeon: Jamesetta So, MD;  Location: [facility];  Service: General;  Laterality: Left;  . LYMPHADENECTOMY    . MASTECTOMY Bilateral   . RADIOLOGY WITH ANESTHESIA N/A 03/02/2019   Procedure: IR WITH ANESTHESIA;  Surgeon: Luanne Bras, MD;  Location: [place] [facility];  Service: Radiology;  Laterality: N/A;   HPI:   Ann Lopez is a 48 y.o. female with a history of diabetes, breast cancer who presents with left-sided weakness that started abruptly this morning.  Patient with history of acute CVA secondary to right ICA terminus occlusion, right MCA occlusion, and right ACA occlusion.  No previous dysphagia hx documented.    Assessment / Plan / Recommendation Clinical Impression  Pt was encountered asleep in bed with NG tube in place and her eyes remained closed throughout this evaluation.  Son was present at bedside for this evaluation.  RN reported that lethargy is likely secondary to administration of Ativan earlier today when pt was taken for an MRI.  Pt with upper extremity movements and head turn away when oral care was completed by RN.  Pt with poor bolus acceptance of minimal po  trials.  She exhibited minimal labial movement in response to ice chip being presented and she exhibited L anterior labial spillage when presented with 1/4 tsp of water.  No lingual manipulation was observed and oral cavity was suctioned following po trials secondary to aspiration risk.  Recommend continuation of NPO with frequent oral care and alternative means of nutrition at this time.  Hopeful that pt will have improved po tolerance when she is more alert.    SLP Visit Diagnosis: Dysphagia, unspecified (R13.10)    Aspiration Risk  Moderate aspiration risk    Diet Recommendation NPO;Alternative means - temporary   Medication Administration: Via alternative means    Other  Recommendations Oral Care Recommendations: Oral care QID;Staff/trained caregiver to provide oral care   Follow up Recommendations (TBD)      Frequency and Duration min 2x/week  2 weeks       Prognosis Prognosis for Safe Diet Advancement: Good      Swallow Study   General HPI:  Ann Lopez is a 48 y.o. female with a history of diabetes, breast cancer who presents with left-sided weakness that started abruptly this morning.  Patient with history of acute CVA secondary to right ICA terminus occlusion, right MCA occlusion, and right ACA occlusion.  No previous dysphagia hx documented.  Type of Study: Bedside Swallow Evaluation Previous Swallow Assessment: None  Diet Prior to this Study:  NPO;NG Tube Temperature Spikes Noted: Yes Respiratory Status: Room air History of Recent Intubation: Yes Length of Intubations (days): (less than 1 day for procedure ) Date extubated: 03/02/19 Behavior/Cognition: Lethargic/Drowsy Oral Cavity Assessment: (Unable to evaluate) Oral Care Completed by SLP: Recent completion by staff Patient Positioning: Upright in bed Baseline Vocal Quality: Not observed Volitional Swallow: Unable to elicit(secondary to lethargy )    Oral/Motor/Sensory Function Overall Oral Motor/Sensory  Function: (Unable to evaluate)   Ice Chips Ice chips: Impaired Presentation: Spoon Oral Phase Impairments: Reduced lingual movement/coordination;Poor awareness of bolus   Thin Liquid Thin Liquid: Impaired Presentation: Spoon Oral Phase Impairments: Reduced labial seal;Reduced lingual movement/coordination;Poor awareness of bolus Oral Phase Functional Implications: Oral holding;Left anterior spillage    Nectar Thick Nectar Thick Liquid: Not tested   Honey Thick Honey Thick Liquid: Not tested   Puree Puree: Not tested   Solid     Solid: Not tested     Ann Lopez M.S., CCC-SLP Acute Rehabilitation Services Office: [phone]  Ann Lopez 03/03/2019,12:50 PM

## 2019-03-03 NOTE — Progress Notes (Signed)
Unsuccessful attempt at completing MRI x2 and CT head x1. Pt continuing to place arm on head, hitting scanner, unable to follow instructions to keep still, and turning excessively making completing scan unsafe for pt at this time despite ativan given. MD Leonie Man has been made aware.

## 2019-03-03 NOTE — Progress Notes (Signed)
SLP Cancellation Note  Patient Details Name: Ann Lopez MRN: [id number] DOB: [birth year]   Cancelled treatment:       Reason Eval/Treat Not Completed: SLE order acknowledged; however, pt is not appropriate to participate at this time secondary to lethargy.  RN reported that pt received ativan for imaging earlier this morning.  SLP will f/u as appropriate.    Ann Lopez 03/03/2019, 12:31 PM

## 2019-03-03 NOTE — Progress Notes (Signed)
STROKE TEAM PROGRESS NOTE   INTERVAL HISTORY I have personally reviewed history of presenting illness, electronic medical records and imaging films in PACS.  Patient underwent successful thrombectomy of right M1 occlusion with right ICA rescue stent placement yesterday.  She was extubated.  She remains drowsy but arousable but has persistent right gaze preference and dense left hemiplegia.  Blood pressure has been adequately controlled but requiring low-dose vasopressors..  Triglycerides and hemoglobin A1c is significantly elevated.  Urine drug screen is positive for amphetamines  Vitals:   03/03/19 0500 03/03/19 0600 03/03/19 0700 03/03/19 0800  BP: (!) 114/54 (!) 124/57 (!) 129/105   Pulse: (!) 52 (!) 59 68   Resp: 20 (!) 23 20   Temp:    98.8 F (37.1 C)  TempSrc:    Axillary  SpO2: 93% 93% 93%   Weight:      Height:        CBC:  Recent Labs  Lab 03/02/19 0823  03/02/19 1251 03/02/19 1254  WBC 16.9*  --  22.4*  --   NEUTROABS 14.0*  --   --   --   HGB 14.9   < > 14.2 13.6  HCT 44.6   < > 41.4 40.0  MCV 89.0  --  87.3  --   PLT 235  --  289  --    < > = values in this interval not displayed.    Basic Metabolic Panel:  Recent Labs  Lab 03/02/19 0823 03/02/19 0826 03/02/19 1254 03/02/19 1255  NA 135 136 138 137  K 4.1 4.1 3.9 4.0  CL 103 103  --  109  CO2 23  --   --  18*  GLUCOSE 250* 247*  --  255*  BUN 8 8  --  6  CREATININE 0.53 0.50  --  0.64  CALCIUM 8.8*  --   --  7.9*   Lipid Panel:     Component Value Date/Time   CHOL 191 01/04/2016 1044   TRIG 830 (H) 01/04/2016 1044   HDL 26 (L) 01/04/2016 1044   CHOLHDL 7.3 (H) 01/04/2016 1044   VLDL NOT CALC 01/04/2016 1044   LDLCALC NOT CALC 01/04/2016 1044   HgbA1c:  Lab Results  Component Value Date   HGBA1C 9.2 (H) 03/02/2019   Urine Drug Screen:     Component Value Date/Time   LABOPIA POSITIVE (A) 03/02/2019 1240   COCAINSCRNUR NONE DETECTED 03/02/2019 1240   LABBENZ NONE DETECTED 03/02/2019 1240    AMPHETMU POSITIVE (A) 03/02/2019 1240   THCU NONE DETECTED 03/02/2019 1240   LABBARB NONE DETECTED 03/02/2019 1240    Alcohol Level     Component Value Date/Time   ETH <10 03/02/2019 [id number]    IMAGING Ct Angio Head W Or Wo Contrast  Result Date: 03/02/2019 CLINICAL DATA:  Stroke code.  Left-sided weakness EXAM: CT ANGIOGRAPHY HEAD AND NECK CT PERFUSION BRAIN TECHNIQUE: Multidetector CT imaging of the head and neck was performed using the standard protocol during bolus administration of intravenous contrast. Multiplanar CT image reconstructions and MIPs were obtained to evaluate the vascular anatomy. Carotid stenosis measurements (when applicable) are obtained utilizing NASCET criteria, using the distal internal carotid diameter as the denominator. Multiphase CT imaging of the brain was performed following IV bolus contrast injection. Subsequent parametric perfusion maps were calculated using RAPID software. CONTRAST:  180mL OMNIPAQUE IOHEXOL 350 MG/ML SOLN COMPARISON:  Noncontrast head CT performed earlier the same day. FINDINGS: CTA NECK FINDINGS Aortic arch: Standard branching.  Right carotid system: CCA widely patent. Abnormal hypodensity within the origin of the right ICA and within the right carotid bulb, likely reflecting a combination of soft plaque and adherent thrombus with resultant high-grade stenosis. Distal to this, the right ICA is asymmetrically small in caliber within the neck, but patent. Left carotid system: CCA widely patent. Mild soft plaque at the carotid bifurcation and within the proximal ICA without significant stenosis of the proximal ICA (50% or greater). Distal to this, the left ICA is widely patent within the neck without stenosis. Vertebral arteries: Codominant. Streak artifact from a dense left-sided contrast bolus somewhat limits evaluation of the V1 left vertebral artery. Within this limitation, the bilateral vertebral arteries are patent throughout the neck without  significant stenosis (50% or greater). Skeleton: No acute bony abnormality. Congenital C3-C4 vertebral body fusion Other neck: No soft tissue neck mass or pathologically enlarged cervical chain lymph nodes. 1.7 cm nodule within the right thyroid lobe. Upper chest: No consolidation within the visualized lung apices. Review of the MIP images confirms the above findings CTA HEAD FINDINGS Anterior circulation: The right internal carotid artery is patent through the siphon region. There is occlusive or near occlusive thrombus within the right ICA terminus with occlusive/near occlusive thrombus extending into the M1 right middle cerebral artery and A1 right anterior cerebral artery. There is some reconstitution of flow within proximal M2 and more distal right MCA branches. However, there are additional high-grade stenoses within proximal to mid right M2 branches, likely reflecting additional thrombus Reconstitution of flow within the distal A1 right ACA, likely from cross flow via the anterior communicating artery. The A1 left ACA is patent without significant stenosis. The A2 and more distal anterior cerebral arteries are patent. The intracranial left internal carotid arteries patent without significant stenosis. The left middle cerebral artery is patent without significant proximal stenosis. No intracranial aneurysm is identified. Posterior circulation: The intracranial vertebral arteries are patent without significant stenosis, as is the basilar artery. The bilateral posterior cerebral arteries are patent without significant proximal stenosis. A sizable right posterior communicating artery is present and patent, although the direction of flow within this vessel is uncertain. Venous sinuses: Within limitations of contrast timing, no convincing thrombus. Anatomic variants: As described. Review of the MIP images confirms the above findings CT Brain Perfusion Findings: CBF (<30%) Volume: 60mL Perfusion (Tmax>6.0s) volume:  79mL Mismatch Volume: 34mL Infarction Location: Right MCA vascular territory IMPRESSION: CTA neck: 1. Prominent abnormal hypodensity within the origin of the right internal carotid artery and right carotid bulb, likely reflecting a combination of soft plaque and adherent thrombus. Resultant high-grade stenosis at this site. 2. Left common and internal carotid arteries patent within the neck without significant stenosis. 3. Bilateral vertebral arteries patent within the neck without significant stenosis. 4. 1.7 cm left thyroid lobe nodule. Nonemergent thyroid ultrasound recommended for further evaluation. CTA head: 1. Occlusive/near occlusive thrombus within the right ICA terminus, extending into the M1 right middle cerebral artery and A1 right anterior cerebral artery. There is some reconstitution of flow within proximal M2 and more distal right MCA branches. However, there are additional high-grade stenoses within proximal to mid right M2 branches, likely reflecting additional thrombus. Opacification of the distal A1 right ACA and more distal right ACA, likely from cross flow via the anterior communicating artery. 2. No other intracranial large vessel occlusion or proximal high-grade arterial stenosis. CT perfusion head: 5 mL core infarct within the right MCA vascular territory. 70 mL region of hypoperfused parenchyma within  the right MCA vascular territory. Reported mismatch volume 65 mL. Electronically Signed   By: Kellie Simmering DO   On: 03/02/2019 09:31   Dg Abd 1 View  Result Date: 03/02/2019 CLINICAL DATA:  NG tube placement EXAM: ABDOMEN - 1 VIEW COMPARISON:  None. FINDINGS: Esophageal tube tip overlies the gastric body. Nonobstructed bowel-gas pattern IMPRESSION: Esophageal tube tip overlies the gastric body Electronically Signed   By: Donavan Foil M.D.   On: 03/02/2019 19:04   Ct Head Wo Contrast  Result Date: 03/02/2019 CLINICAL DATA:  Status post revascularization of the occluded right ICA  terminus. EXAM: CT HEAD WITHOUT CONTRAST TECHNIQUE: Contiguous axial images were obtained from the base of the skull through the vertex without intravenous contrast. COMPARISON:  CT head and CT perfusion 03/02/2019 FINDINGS: Brain: There is contrast staining within the core infarct area of the right lentiform nucleus. There is also some contrast staining within the body of the right caudate lobe. No other focal areas of contrast enhancement is present. Gray-white differentiation is otherwise normal. Acute hemorrhage or mass lesion is present. There is some mass effect on the right lateral ventricle. Vascular: No hyperdense vessel or unexpected calcification.  The Skull: Calvarium is intact. No focal lytic or blastic lesions are present. Sinuses/Orbits: Mild mucosal thickening is present inferior right maxillary sinus. Is scattered ethmoid opacification and some mucosal thickening in the inferior right frontal sinus. No fluid levels are present. Mastoid air cells are clear. Globes and orbits are within normal limits. IMPRESSION: 1. Contrast staining in the right lentiform nucleus and caudate following revascularization. This is consistent with areas infarction. 2. Remainder of right MCA territory is within normal limits. 3. No acute hemorrhage. 4. Mild sinus disease. Electronically Signed   By: San Morelle M.D.   On: 03/02/2019 14:08   Ct Angio Neck W Or Wo Contrast  Result Date: 03/02/2019 CLINICAL DATA:  Stroke code.  Left-sided weakness. EXAM: CT ANGIOGRAPHY HEAD AND NECK CT PERFUSION BRAIN TECHNIQUE: Multidetector CT imaging of the head and neck was performed using the standard protocol during bolus administration of intravenous contrast. Multiplanar CT image reconstructions and MIPs were obtained to evaluate the vascular anatomy. Carotid stenosis measurements (when applicable) are obtained utilizing NASCET criteria, using the distal internal carotid diameter as the denominator. Multiphase CT  imaging of the brain was performed following IV bolus contrast injection. Subsequent parametric perfusion maps were calculated using RAPID software. CONTRAST:  Intravenous contrast given not known at this time COMPARISON:  Noncontrast head CT FINDINGS: CTA NECK FINDINGS Aortic arch: Standard branching. Right carotid system: CCA widely patent. Abnormal hypodensity within the origin of the right ICA and within the right carotid bulb, likely reflecting a combination of soft plaque and adherent thrombus with resultant high-grade stenosis. Distal to this, the right ICA is asymmetrically small in caliber within the neck, but patent. Left carotid system: CCA widely patent. Mild soft plaque at the carotid bifurcation and within the proximal ICA without significant stenosis of the proximal ICA (50% or greater). Distal to this, the left ICA is widely patent within the neck without stenosis. Vertebral arteries: Codominant. Streak artifact from a dense left-sided contrast bolus somewhat limits evaluation of the V1 left vertebral artery. Within this limitation, the bilateral vertebral arteries are patent throughout the neck without significant stenosis (50% or greater). Skeleton: No acute bony abnormality. Congenital C3-C4 vertebral body fusion Other neck: No soft tissue neck mass or pathologically enlarged cervical chain lymph nodes. 1.7 cm nodule within the right thyroid  lobe. Upper chest: No consolidation within the visualized lung apices. Review of the MIP images confirms the above findings CTA HEAD FINDINGS Anterior circulation: The right internal carotid artery is patent through the siphon region. There is occlusive or near occlusive thrombus within the right ICA terminus with occlusive/near occlusive thrombus extending into the M1 right middle cerebral artery and A1 right anterior cerebral artery. There is some reconstitution of flow within proximal M2 and more distal right MCA branches. However, there are additional  high-grade stenoses within proximal to mid right M2 branches, likely reflecting additional thrombus Reconstitution of flow within the distal A1 right ACA, likely from cross flow via the anterior communicating artery. The A1 left ACA is patent without significant stenosis. The A2 and more distal anterior cerebral arteries are patent. The intracranial left internal carotid arteries patent without significant stenosis. The left middle cerebral artery is patent without significant proximal stenosis. No intracranial aneurysm is identified. Posterior circulation: The intracranial vertebral arteries are patent without significant stenosis, as is the basilar artery. The bilateral posterior cerebral arteries are patent without significant proximal stenosis. A sizable right posterior communicating artery is present and patent, although the direction of flow within this vessel is uncertain. Venous sinuses: Within limitations of contrast timing, no convincing thrombus. Anatomic variants: As described. Review of the MIP images confirms the above findings CT Brain Perfusion Findings: CBF (<30%) Volume: 63mL Perfusion (Tmax>6.0s) volume: 57mL Mismatch Volume: 24mL Infarction Location:Right MCA vascular territory These results were called by telephone at the time of interpretation on 03/02/2019 at 8:46 am to provider [facility] , who verbally acknowledged these results. IMPRESSION: CTA neck: 1. Prominent abnormal hypodensity within the origin of the right internal carotid artery and right carotid bulb, likely reflecting a combination of soft plaque and adherent thrombus. Resultant high-grade stenosis at this site. 2. Left common and internal carotid arteries patent within the neck without significant stenosis. 3. Bilateral vertebral arteries patent within the neck without significant stenosis. 4. 1.7 cm left thyroid lobe nodule. Nonemergent thyroid ultrasound recommended for further evaluation. CTA head: 1. Occlusive/near  occlusive thrombus within the right ICA terminus, extending into the M1 right middle cerebral artery and A1 right anterior cerebral artery. There is some reconstitution of flow within proximal M2 and more distal right MCA branches. However, there are additional high-grade stenoses within proximal to mid right M2 branches, likely reflecting additional thrombus. Opacification of the distal A1 right ACA and more distal right ACA, likely from cross flow via the anterior communicating artery. 2. No other intracranial large vessel occlusion or proximal high-grade arterial stenosis. CT perfusion head: 5 mL core infarct within the right MCA vascular territory. 70 mL region of hypoperfused parenchyma within the right MCA vascular territory. Reported mismatch volume 65 mL. Electronically Signed   By: Kellie Simmering DO   On: 03/02/2019 09:08   Ct Cerebral Perfusion W Contrast  Result Date: 03/02/2019 CLINICAL DATA:  Stroke code.  Left-sided weakness EXAM: CT ANGIOGRAPHY HEAD AND NECK CT PERFUSION BRAIN TECHNIQUE: Multidetector CT imaging of the head and neck was performed using the standard protocol during bolus administration of intravenous contrast. Multiplanar CT image reconstructions and MIPs were obtained to evaluate the vascular anatomy. Carotid stenosis measurements (when applicable) are obtained utilizing NASCET criteria, using the distal internal carotid diameter as the denominator. Multiphase CT imaging of the brain was performed following IV bolus contrast injection. Subsequent parametric perfusion maps were calculated using RAPID software. CONTRAST:  157mL OMNIPAQUE IOHEXOL 350 MG/ML SOLN COMPARISON:  Noncontrast head CT performed earlier the same day. FINDINGS: CTA NECK FINDINGS Aortic arch: Standard branching. Right carotid system: CCA widely patent. Abnormal hypodensity within the origin of the right ICA and within the right carotid bulb, likely reflecting a combination of soft plaque and adherent thrombus  with resultant high-grade stenosis. Distal to this, the right ICA is asymmetrically small in caliber within the neck, but patent. Left carotid system: CCA widely patent. Mild soft plaque at the carotid bifurcation and within the proximal ICA without significant stenosis of the proximal ICA (50% or greater). Distal to this, the left ICA is widely patent within the neck without stenosis. Vertebral arteries: Codominant. Streak artifact from a dense left-sided contrast bolus somewhat limits evaluation of the V1 left vertebral artery. Within this limitation, the bilateral vertebral arteries are patent throughout the neck without significant stenosis (50% or greater). Skeleton: No acute bony abnormality. Congenital C3-C4 vertebral body fusion Other neck: No soft tissue neck mass or pathologically enlarged cervical chain lymph nodes. 1.7 cm nodule within the right thyroid lobe. Upper chest: No consolidation within the visualized lung apices. Review of the MIP images confirms the above findings CTA HEAD FINDINGS Anterior circulation: The right internal carotid artery is patent through the siphon region. There is occlusive or near occlusive thrombus within the right ICA terminus with occlusive/near occlusive thrombus extending into the M1 right middle cerebral artery and A1 right anterior cerebral artery. There is some reconstitution of flow within proximal M2 and more distal right MCA branches. However, there are additional high-grade stenoses within proximal to mid right M2 branches, likely reflecting additional thrombus Reconstitution of flow within the distal A1 right ACA, likely from cross flow via the anterior communicating artery. The A1 left ACA is patent without significant stenosis. The A2 and more distal anterior cerebral arteries are patent. The intracranial left internal carotid arteries patent without significant stenosis. The left middle cerebral artery is patent without significant proximal stenosis. No  intracranial aneurysm is identified. Posterior circulation: The intracranial vertebral arteries are patent without significant stenosis, as is the basilar artery. The bilateral posterior cerebral arteries are patent without significant proximal stenosis. A sizable right posterior communicating artery is present and patent, although the direction of flow within this vessel is uncertain. Venous sinuses: Within limitations of contrast timing, no convincing thrombus. Anatomic variants: As described. Review of the MIP images confirms the above findings CT Brain Perfusion Findings: CBF (<30%) Volume: 34mL Perfusion (Tmax>6.0s) volume: 62mL Mismatch Volume: 33mL Infarction Location: Right MCA vascular territory IMPRESSION: CTA neck: 1. Prominent abnormal hypodensity within the origin of the right internal carotid artery and right carotid bulb, likely reflecting a combination of soft plaque and adherent thrombus. Resultant high-grade stenosis at this site. 2. Left common and internal carotid arteries patent within the neck without significant stenosis. 3. Bilateral vertebral arteries patent within the neck without significant stenosis. 4. 1.7 cm left thyroid lobe nodule. Nonemergent thyroid ultrasound recommended for further evaluation. CTA head: 1. Occlusive/near occlusive thrombus within the right ICA terminus, extending into the M1 right middle cerebral artery and A1 right anterior cerebral artery. There is some reconstitution of flow within proximal M2 and more distal right MCA branches. However, there are additional high-grade stenoses within proximal to mid right M2 branches, likely reflecting additional thrombus. Opacification of the distal A1 right ACA and more distal right ACA, likely from cross flow via the anterior communicating artery. 2. No other intracranial large vessel occlusion or proximal high-grade arterial stenosis. CT perfusion head: 5  mL core infarct within the right MCA vascular territory. 70 mL region  of hypoperfused parenchyma within the right MCA vascular territory. Reported mismatch volume 65 mL. Electronically Signed   By: Kellie Simmering DO   On: 03/02/2019 09:31   Ct Head Code Stroke Wo Contrast  Result Date: 03/02/2019 CLINICAL DATA:  Code stroke. Altered level of consciousness (LOC), unexplained. Last known normal 2300, left-sided weakness. EXAM: CT HEAD WITHOUT CONTRAST TECHNIQUE: Contiguous axial images were obtained from the base of the skull through the vertex without intravenous contrast. COMPARISON:  Brain MRI 12/19/2015, head CT 12/19/2015 FINDINGS: Brain: Moderate to severe motion degradation, significantly limiting evaluation. No acute intracranial hemorrhage or acute demarcated cortical infarct definitively identified. No intracranial mass identified. No midline shift. No extra-axial fluid collection identified. Vascular: No hyperdense vessel identified Skull: No calvarial fracture or suspicious osseous lesion identified. Sinuses/Orbits: Globes unremarkable within described limitations. Ethmoid sinus mucosal thickening. No significant mastoid effusion. ASPECTS [facility] Stroke Program Early CT Score) difficult to report with confidence given degree of motion degradation. These results were communicated to Dr. Leonel Ramsay At 8:36 amon 11/23/2020by text page via the [facility] messaging system. IMPRESSION: 1. Moderate to severe motion degradation significantly limits evaluation. 2. No acute intracranial hemorrhage or demarcated cortical infarction definitively identified. Electronically Signed   By: Kellie Simmering DO   On: 03/02/2019 08:36   Cerebral Angio  01/30/2019 S/P rt COMMON CAROTID ARTERIOGRAM,FOLLOWED BY COMPLETE REVASCULAR IZATION OF OCCLUDED  RT ICA TERMINUS,RT MCA AND RT ACA WITH X PASS WITH 4MMX 40 MM SOLITAIRE X fr RETRIEVER DEVICE AND PENUMBRA ASPIRATION ACHIEVING A tici 2c REVASCULARIZATION . S/P STENT ASSISTED ANGIOPLASTY OF SYMPTOTMATIC PROX RT ICA  STENOSIS.Marland Kitchen  EEG 03/02/2019 This study is suggestive of profound diffuse encephalopathy, non specific to etiology but could be secondary to sedation. No seizures or epileptiform discharges were seen throughout the recording.   PHYSICAL EXAM Middle-aged Caucasian lady is not in distress but appears restless and slightly agitated. . Afebrile. Head is nontraumatic. Neck is supple without bruit.    Cardiac exam no murmur or gallop. Lungs are clear to auscultation. Distal pulses are well felt. Neurological Exam :  Patient is drowsy but can arouse easily.  She has right gaze preference and can look partially to the left.  She has dense left homonymous hemianopsia.  She has dysarthria but can be easily understood.  She follows commands well.  There is left lower facial weakness.  There is left hemineglect.  There is left dense hemiplegia with 1/5 left upper extremity and 2/5 left lower extremity strength.  She has normal strength on the right.  Sensation is diminished on the left.  Tone is diminished on the left normal on the right.  Plantars left is upgoing right is downgoing. ASSESSMENT/PLAN Ann Lopez is a 48 y.o. female with history of DB, HLD and breast cancer who woke with L sided weakness, R gaze and neuro neglect. CT showed a large penumbra w/ small core and R M1 occlusion taken for mechanical thrombectomy.   Stroke:   R MCA infarct s/p IR w/ R ICA stent placement in setting of large vessel disease  Code Stroke CT head No acute abnormality.  CTA head R ICA terminus near occlusion extending into R M1 and R A1. High grade stenoses R M2 branches.  CTA neck R ICA origin/carotid bulb hypodensity w/ high grade stenosis. 1.7cm L thyroid nodule.  CT perfusion R MCA 68mL core infarct w/ 67mL penumbra. Mismatch vol 65 mL.  Cerebral Angio R ICA, R MCA and R AVA occlusion w/ TICI2C revascularization w/ solitaire and penumbra. Stent angioplasty R ICA stenosis    Post IR CT contrast blush in the  caudate and putamen nuclei. NO gross mass effect or ICH seen.  MRI  (clausophobia, given Haldol and ativan on call)     MRA C CTA  2D Echo pending  EEG neg sz  LDL pending   HgbA1c 9.2  SCDs for VTE prophylaxis  aspirin 81 mg daily prior to admission, now on aspirin 81 mg daily and Brilinta (ticagrelor) 90 mg bid. Continue DAPT.  Therapy recommendations:  Pending. Ok to be OOB  Disposition:  pending   Hypotension  Home meds:  On lasix, no hx HTN BP goal per IR x 24h following IR Currently on neo BP on the low side (BP cuff on leg d/t double mastectomies) Would not bolus. Wean neo. Increase SBP goal to < 180 once 24h up  Hyperlipidemia  Home meds:  tricor 145  LDL pending, goal < 70  Consider statin based on LDL results  Diabetes type II Uncontrolled  Home meds:  lantus 20    Now on lantus 10  HgbA1c 9.2, goal < 7.0  CBGs  SSI  Dysphagia . Secondary to stroke . NPO . Speech on board   Other Stroke Risk Factors  Cigarette smoker, advised to stop smoking  Substance abuse - pt denies use of speed, is on narcs for chronic pain - UDS positive for amphetamines and opiates   Obesity, Body mass index is 31.52 kg/m., recommend weight loss, diet and exercise as appropriate   Migraine. Seen by DDr. Krista Blue in 11/2014 and put on topamax 50 bid and maxalt prn  Other Active Problems  Breast cancer 2009 s/p surgical  Post chemo memory deficit. Seen by Dr. Tomi Likens who recommended neuro psych testing. No f/u since 08/2015    1.7cm L thyroid nodule - non emergent [place] recommended for f/u  Anxiety/depression. On Cymbalta  Lactic acid 3.4. check UA. D/c foley.  Hypokalemia 4.0->3.2  Hospital day # 1 I have personally obtained history,examined this patient, reviewed notes, independently viewed imaging studies, participated in medical decision making and plan of care.ROS completed by me personally and pertinent positives fully documented  I have made any additions or  clarifications directly to the above note.  She presented with sudden onset of left hemiplegia and gaze deviation secondary to right MCA occlusion due to right carotid stenosis with distal embolization.  She underwent successful thrombectomy along with placement of rescue right ICA stent.  She needs strict blood pressure control as per post intervention protocol and close neurological monitoring.  Mobilize out of bed.  Therapy consults.  Wean vasopressors.  Check MRI scan of the brain later today.  Speech therapy for swallow eval.  She will need aspirin and Brilinta due to her carotid stent.  No family available at the bedside for discussion.  Discussed with Dr. Estanislado Pandy and Dr. Kipp Brood critical care medicine. This patient is critically ill and at significant risk of neurological worsening, death and care requires constant monitoring of vital signs, hemodynamics,respiratory and cardiac monitoring, extensive review of multiple databases, frequent neurological assessment, discussion with family, other specialists and medical decision making of high complexity.I have made any additions or clarifications directly to the above note.This critical care time does not reflect procedure time, or teaching time or supervisory time of PA/NP/Med Resident etc but could involve care discussion time.  I spent 40  minutes of neurocritical care time  in the care of  this patient.      Antony Contras, MD Medical Director [facility] Pager: [phone] 03/03/2019 3:23 PM   To contact Stroke Continuity provider, please refer to [email or web address] After hours, contact General Neurology

## 2019-03-04 ENCOUNTER — Inpatient Hospital Stay (HOSPITAL_COMMUNITY): Payer: Medicare Other

## 2019-03-04 DIAGNOSIS — Z853 Personal history of malignant neoplasm of breast: Secondary | ICD-10-CM

## 2019-03-04 DIAGNOSIS — E876 Hypokalemia: Secondary | ICD-10-CM

## 2019-03-04 DIAGNOSIS — R6510 Systemic inflammatory response syndrome (SIRS) of non-infectious origin without acute organ dysfunction: Secondary | ICD-10-CM

## 2019-03-04 DIAGNOSIS — R4189 Other symptoms and signs involving cognitive functions and awareness: Secondary | ICD-10-CM

## 2019-03-04 DIAGNOSIS — R0682 Tachypnea, not elsewhere classified: Secondary | ICD-10-CM

## 2019-03-04 DIAGNOSIS — E1169 Type 2 diabetes mellitus with other specified complication: Secondary | ICD-10-CM

## 2019-03-04 DIAGNOSIS — I63231 Cerebral infarction due to unspecified occlusion or stenosis of right carotid arteries: Secondary | ICD-10-CM

## 2019-03-04 DIAGNOSIS — I10 Essential (primary) hypertension: Secondary | ICD-10-CM

## 2019-03-04 DIAGNOSIS — Z720 Tobacco use: Secondary | ICD-10-CM

## 2019-03-04 DIAGNOSIS — E669 Obesity, unspecified: Secondary | ICD-10-CM

## 2019-03-04 DIAGNOSIS — I69391 Dysphagia following cerebral infarction: Secondary | ICD-10-CM

## 2019-03-04 LAB — BASIC METABOLIC PANEL
Anion gap: 10 (ref 5–15)
BUN: 10 mg/dL (ref 6–20)
CO2: 21 mmol/L — ABNORMAL LOW (ref 22–32)
Calcium: 8.7 mg/dL — ABNORMAL LOW (ref 8.9–10.3)
Chloride: 109 mmol/L (ref 98–111)
Creatinine, Ser: 0.59 mg/dL (ref 0.44–1.00)
GFR calc Af Amer: >60 mL/min (ref >60)
GFR calc non Af Amer: >60 mL/min (ref >60)
Glucose, Bld: 177 mg/dL — ABNORMAL HIGH (ref 70–99)
Potassium: 3.6 mmol/L (ref 3.5–5.1)
Sodium: 140 mmol/L (ref 135–145)

## 2019-03-04 LAB — GLUCOSE, CAPILLARY
Glucose-Capillary: 164 mg/dL — ABNORMAL HIGH (ref 70–99)
Glucose-Capillary: 202 mg/dL — ABNORMAL HIGH (ref 70–99)
Glucose-Capillary: 195 mg/dL — ABNORMAL HIGH (ref 70–99)
Glucose-Capillary: 211 mg/dL — ABNORMAL HIGH (ref 70–99)
Glucose-Capillary: 228 mg/dL — ABNORMAL HIGH (ref 70–99)
Glucose-Capillary: 121 mg/dL — ABNORMAL HIGH (ref 70–99)

## 2019-03-04 LAB — LDL CHOLESTEROL, DIRECT: Direct LDL: 39.4 mg/dL (ref 0–99)

## 2019-03-04 LAB — PHOSPHORUS
Phosphorus: 2.9 mg/dL (ref 2.5–4.6)
Phosphorus: 2.9 mg/dL (ref 2.5–4.6)

## 2019-03-04 LAB — MAGNESIUM
Magnesium: 1.7 mg/dL (ref 1.7–2.4)
Magnesium: 1.6 mg/dL — ABNORMAL LOW (ref 1.7–2.4)

## 2019-03-04 MED ORDER — HYDRALAZINE HCL 20 MG/ML IJ SOLN
INTRAMUSCULAR | Status: AC
  Administered 2019-03-04: 20 mg via INTRAVENOUS
  Filled 2019-03-04: qty 1

## 2019-03-04 MED ORDER — GLUCERNA 1.2 CAL PO LIQD
1000.0000 mL | ORAL | Status: DC
  Administered 2019-03-04: 1000 mL
  Filled 2019-03-04 (×3): qty 1000

## 2019-03-04 MED ORDER — LORAZEPAM 2 MG/ML IJ SOLN
1.0000 mg | Freq: Once | INTRAMUSCULAR | Status: AC
  Administered 2019-03-04: 1 mg via INTRAVENOUS
  Filled 2019-03-04: qty 1

## 2019-03-04 MED ORDER — INSULIN GLARGINE 100 UNIT/ML ~~LOC~~ SOLN
15.0000 [IU] | Freq: Every day | SUBCUTANEOUS | Status: DC
  Administered 2019-03-04 – 2019-03-08 (×5): 15 [IU] via SUBCUTANEOUS
  Filled 2019-03-04 (×8): qty 0.15

## 2019-03-04 MED ORDER — QUETIAPINE FUMARATE 25 MG PO TABS
12.5000 mg | ORAL_TABLET | Freq: Every day | ORAL | Status: DC
  Administered 2019-03-04 – 2019-03-09 (×4): 12.5 mg
  Filled 2019-03-04 (×4): qty 1

## 2019-03-04 MED ORDER — QUETIAPINE FUMARATE 25 MG PO TABS
25.0000 mg | ORAL_TABLET | Freq: Every day | ORAL | Status: DC
  Administered 2019-03-04 – 2019-03-05 (×2): 25 mg
  Filled 2019-03-04 (×5): qty 1

## 2019-03-04 MED ORDER — QUETIAPINE FUMARATE 25 MG PO TABS: 25.0000 mg | ORAL_TABLET | Freq: Every day | ORAL | Status: DC

## 2019-03-04 MED ORDER — DEXMEDETOMIDINE HCL IN NACL 400 MCG/100ML IV SOLN
0.4000 ug/kg/h | INTRAVENOUS | Status: DC
  Administered 2019-03-04: 0.4 ug/kg/h via INTRAVENOUS
  Filled 2019-03-04: qty 100

## 2019-03-04 MED ORDER — QUETIAPINE FUMARATE 25 MG PO TABS: 12.5000 mg | ORAL_TABLET | Freq: Every day | ORAL | Status: DC

## 2019-03-04 MED ORDER — HYDRALAZINE HCL 20 MG/ML IJ SOLN
10.0000 mg | Freq: Four times a day (QID) | INTRAMUSCULAR | Status: DC | PRN
  Administered 2019-03-04: 12:00:00 20 mg via INTRAVENOUS

## 2019-03-04 NOTE — Evaluation (Signed)
Physical Therapy Evaluation Patient Details Name: Ann Lopez MRN: [id number] DOB: 24-Jul-1970 Today's Date: 03/04/2019   History of Present Illness  48 yo female admitted 03/02/19 with R MCA s/p fall from commode due to L side weakness NIH 12 R gaze preference and L neglect. pt under went R common carotid aterigogram complete revascularixation of R ICA  R MCA and R ACA via emergent mechanical thrombectomy  PMH Breast CA DM asthma depression current smoker gastric bypass 2013 IBS  Clinical Impression   Pt admitted with above diagnosis. Patient has varied between restless and lethargic and was arousable, but very sleepy during assessment. She has very strong rt gaze preference. Noted to have associated reactions in both LUE and LLE. Patient unable to provide history due to lethargy, however appears to have been independent with mobility prior to CVA.  Pt currently with functional limitations due to the deficits listed below (see PT Problem List). Pt will benefit from skilled PT to increase their independence and safety with mobility to allow discharge to the venue listed below.       Follow Up Recommendations CIR    Equipment Recommendations  Other (comment)(TBD)    Recommendations for Other Services Rehab consult     Precautions / Restrictions Precautions Precautions: Fall      Mobility  Bed Mobility Overal bed mobility: Needs Assistance Bed Mobility: Rolling;Sidelying to Sit;Sit to Sidelying Rolling: Total assist;+2 for safety/equipment;+2 for physical assistance Sidelying to sit: Total assist;+2 for physical assistance;+2 for safety/equipment     Sit to sidelying: Total assist;+2 for physical assistance;+2 for safety/equipment General bed mobility comments: arousable, but very sleepy  Transfers Overall transfer level: Needs assistance   Transfers: Sit to/from Stand Sit to Stand: Mod assist;+2 physical assistance;+2 safety/equipment;From elevated surface(from ICU bed)          General transfer comment: pt initiated coming forward as if to stand; once therapists in position (blocking each knee) pt able to come to near fully standing with left knee giving, but not flacid/buckling  Ambulation/Gait                Stairs            Wheelchair Mobility    Modified Rankin (Stroke Patients Only) Modified Rankin (Stroke Patients Only) Pre-Morbid Rankin Score: No symptoms Modified Rankin: Severe disability     Balance Overall balance assessment: Needs assistance Sitting-balance support: No upper extremity supported;Feet unsupported Sitting balance-Leahy Scale: Zero   Postural control: Left lateral lean Standing balance support: No upper extremity supported Standing balance-Leahy Scale: Zero Standing balance comment: not fully extending hips or knees, head hanging forward                             Pertinent Vitals/Pain Pain Assessment: Faces Faces Pain Scale: No hurt Pain Intervention(s): Monitored during session;Repositioned    Home Living Family/patient expects to be discharged to:: Private residence Living Arrangements: Spouse/significant other;Children               Additional Comments: information per chart due to pt too lethargic to address    Prior Function Level of Independence: Independent               Hand Dominance        Extremity/Trunk Assessment   Upper Extremity Assessment Upper Extremity Assessment: Defer to OT evaluation    Lower Extremity Assessment Lower Extremity Assessment: LLE deficits/detail LLE Deficits / Details: noted supination  of foot with stimulation of donning sock; did not fully buckle in standing (able to bear some weight) otherwise too lethargic to asssess    Cervical / Trunk Assessment Cervical / Trunk Assessment: Other exceptions Cervical / Trunk Exceptions: obese  Communication      Cognition Arousal/Alertness: Lethargic Behavior During Therapy: Flat  affect(progressed to restless) Overall Cognitive Status: Difficult to assess Area of Impairment: Attention;Following commands;Awareness                   Current Attention Level: Sustained   Following Commands: Follows one step commands inconsistently;Follows one step commands with increased time   Awareness: Intellectual   General Comments: pt required max verbal and tactile stimulation to stay awake (even at EOB) with frequent eye-closing while speaking      General Comments General comments (skin integrity, edema, etc.): RN reported no sedation given this morning. Place in chair position with multiple pillows to her left to prevent left lean.    Exercises     Assessment/Plan    PT Assessment Patient needs continued PT services  PT Problem List Decreased strength;Decreased activity tolerance;Decreased balance;Decreased mobility;Decreased cognition;Decreased knowledge of use of DME;Decreased safety awareness;Decreased knowledge of precautions;Impaired sensation;Impaired tone;Obesity       PT Treatment Interventions DME instruction;Gait training;Functional mobility training;Therapeutic activities;Balance training;Neuromuscular re-education;Cognitive remediation;Patient/family education    PT Goals (Current goals can be found in the Care Plan section)  Acute Rehab PT Goals Patient Stated Goal: unable PT Goal Formulation: Patient unable to participate in goal setting Time For Goal Achievement: 03/18/19 Potential to Achieve Goals: Good    Frequency Min 4X/week   Barriers to discharge Other (comment) unknown support system    Co-evaluation PT/OT/SLP Co-Evaluation/Treatment: Yes Reason for Co-Treatment: Complexity of the patient's impairments (multi-system involvement);For patient/therapist safety;To address functional/ADL transfers PT goals addressed during session: Mobility/safety with mobility;Balance         AM-PAC PT "6 Clicks" Mobility  Outcome Measure Help  needed turning from your back to your side while in a flat bed without using bedrails?: Total Help needed moving from lying on your back to sitting on the side of a flat bed without using bedrails?: Total Help needed moving to and from a bed to a chair (including a wheelchair)?: Total Help needed standing up from a chair using your arms (e.g., wheelchair or bedside chair)?: Total Help needed to walk in hospital room?: Total Help needed climbing 3-5 steps with a railing? : Total 6 Click Score: 6    End of Session Equipment Utilized During Treatment: Gait belt Activity Tolerance: Patient limited by lethargy Patient left: in bed;with nursing/sitter in room(chair position) Nurse Communication: Mobility status PT Visit Diagnosis: Hemiplegia and hemiparesis Hemiplegia - Right/Left: Left Hemiplegia - dominant/non-dominant: Non-dominant Hemiplegia - caused by: Cerebral infarction    Time: 1037-1100 PT Time Calculation (min) (ACUTE ONLY): 23 min   Charges:   PT Evaluation $PT Eval Moderate Complexity: 1 Mod           Barry Brunner, PT Pager [phone]   Rexanne Mano 03/04/2019, 12:03 PM

## 2019-03-04 NOTE — Progress Notes (Signed)
  Speech Language Pathology Treatment: Dysphagia  Patient Details Name: Ann Lopez MRN: [id number] DOB: 09/09/1970 Today's Date: 03/04/2019 Time: [id number] SLP Time Calculation (min) (ACUTE ONLY): 23 min  Assessment / Plan / Recommendation Clinical Impression  Waxing and waning alertness, attention and restless throughout however improved ability to participate versus yesterday. Answering questions and following commands. Multiple straw sips thin did not yield overt s/s aspiration despite larger bore NG, CVA and lethargy all increasing aspiration risk. Increased work of breathing. Mildly prolonged mastication with cracker- liquid assisted transit (no throat clear or coughing subsequently). Significant CN VII impairment affecting ROM > labial residual with sensation. SLP will order Dys 2, thin only to be attempted when she is appropriately awake with full supervision/assist only, meds whole in puree, straws allowed. Following swallow assessment she could not maintain alertness to participate in speech-language-cognitive evaluation- will follow and continue intervention.    HPI HPI:  Ann Lopez is a 48 y.o. female with a history of diabetes, breast cancer who presents with left-sided weakness that started abruptly this morning.  Patient with history of acute CVA secondary to right ICA terminus occlusion, right MCA occlusion, and right ACA occlusion.  No previous dysphagia hx documented.       SLP Plan  Continue with current plan of care       Recommendations  Diet recommendations: Dysphagia 2 (fine chop);Thin liquid Liquids provided via: Cup;Straw Medication Administration: Whole meds with puree Supervision: Patient able to self feed;Full supervision/cueing for compensatory strategies;Staff to assist with self feeding Compensations: Minimize environmental distractions;Slow rate;Small sips/bites;Lingual sweep for clearance of pocketing Postural Changes and/or Swallow Maneuvers:  Seated upright 90 degrees                General recommendations: Rehab consult Oral Care Recommendations: Oral care BID Follow up Recommendations: Inpatient Rehab SLP Visit Diagnosis: Dysphagia, unspecified (R13.10) Plan: Continue with current plan of care       GO                Ann Lopez 03/04/2019, 9:44 AM  Ann Lopez.Ed Risk analyst [phone] Office [phone]

## 2019-03-04 NOTE — Progress Notes (Addendum)
`  NAME:  Ann Lopez, MRN:  [id number], DOB:  05/29/1970, LOS: 2 ADMISSION DATE:  03/02/2019, CONSULTATION DATE:  03/02/2019 REFERRING MD:  Roland Rack, MD CHIEF COMPLAINT:  Hypotension s/p R MCA embolectomy   Brief History   48 y/o female with PMH as below presented to ED 11/23 with left hemiparesis, left-sided neglect, and right gaze, NIHSS 12 --> IR MCA occlusion s/p embolectomy.  History of present illness   This 48 y/o female with PMH as below presented via EMS to the ED 11/23 am with left hemiparesis, left-sided neglect, and right gaze, NIHSS 12.  She was last known normal at 2300 11/22, awoke in am 11/23 and was able to ambulate to the bathroom but then could not stand from the toilet and fell to the ground.  She was taken to IR for emergent R MCA embolectomy.  When she arrived in 4N ICU, she was minimally responsive, exhibited seizure-like activity upon stimulation, and was hypotensive requiring pressor support.  Past Medical History  Anxiety/Depression, Asthma,Breast CA 2009 s/p bilat mastectomy Type 2 DM,HTN,HLD,Osteopenia,Gastric bypass 2013,GERD IBS Significant Hospital Events     Consults:  11/23 Neuro 11/23 PCCM  Procedures:  11/23 MCA embolectomy  Significant Diagnostic Tests:  11/23 CT Head w/o contrast> no hemorrhage 11/23 CTA/CTP >There is occlusive or near occlusive thrombus within the right ICA terminus with occlusive/near occlusive thrombus extending into the M1 right middle cerebral artery and A1 right anterior cerebral artery. There is some reconstitution of flow within proximal M2 and more distal right MCA branches. However, there are additional high-grade stenoses within proximal to mid right M2 branches, likely reflecting additional thrombus. 11/23 stat CT brain>>> occlusive thrombus within the right ICA terminus, extending to the M1 right middle cerebral artery and A1 right anterior cerebral artery 11/24 MRI brain>>> 11/23 EEG>>> consistent with  profound diffuse encephalopathic nonspecific 11/25 CT >> shows small R basal ganglia stroke with some edema and compression of the ventricle (personal review) Micro Data:  SARS-CoV-2 negative  Antimicrobials:   none  Interim history/subjective:  Remains somnolent, but will move both sides and follow commands and passed swallow evaluation.  Objective   Blood pressure (!) 159/78, pulse 62, temperature 98.4 F (36.9 C), temperature source Axillary, resp. rate (!) 25, height 5\' 4"  (1.626 m), weight 83.3 kg, SpO2 97 %.        Intake/Output Summary (Last 24 hours) at 03/04/2019 0934 Last data filed at 03/04/2019 0800 Gross per 24 hour  Intake 2275.86 ml  Output 650 ml  Net 1625.86 ml   Filed Weights   03/02/19 0800 03/02/19 0905  Weight: 83.3 kg 83.3 kg    Examination: General: Well-nourished well-developed female HEENT: MM pink/moist, pupils equal reactive light, extraocular movement is unremarkable Neuro: Follows commands moves all extremities CV: Heart sounds are regular regular rate rhythm sinus bradycardia PULM: Diminished in the bases currently on room air with sats 96% Vent none FIO2 21% GI: soft, bsx4 active  GU: Foley with yellow urine Extremities: warm/dry,  edema  Skin: no rashes or lesions   Resolved Hospital Problem list   Hypotension  Assessment & Plan:   Acute metabolic enecphalopathy after CVA - right M1 occlusion. Now s/p thrombectomy 11/23 HLD Type 2 DM w/ hyperglycemia Hx anxiety/depression GERD  Favorable imaging and improvement following intervention. Small infarct core in basal ganglia may be contributing to somnolence.  Decreasing neurovital frequency will also help sleep deprivation.  Continue to monitor for worsening cerebral edema.   Best practice:  Diet: NPO Pain/Anxiety/Delirium protocol (if indicated): NA VAP protocol (if indicated): NA  DVT prophylaxis: SCD GI prophylaxis: PPI Glucose control: ssi Mobility: BR Code Status: full  code  Family Communication: 03/03/2019 patient updated at bedside Disposition:  ICU  Labs   CBC: Recent Labs  Lab 03/02/19 0823 03/02/19 0826 03/02/19 1251 03/02/19 1254 03/03/19 1453  WBC 16.9*  --  22.4*  --  20.0*  NEUTROABS 14.0*  --   --   --  15.4*  HGB 14.9 15.3* 14.2 13.6 12.4  HCT 44.6 45.0 41.4 40.0 37.3  MCV 89.0  --  87.3  --  89.4  PLT 235  --  289  --  [id number]    Basic Metabolic Panel: Recent Labs  Lab 03/02/19 0823 03/02/19 0826 03/02/19 1254 03/02/19 1255 03/03/19 0846 03/03/19 1453 03/04/19 0417  NA 135 136 138 137 142 141 140  K 4.1 4.1 3.9 4.0 3.2* 3.3* 3.6  CL 103 103  --  109 112* 113* 109  CO2 23  --   --  18* 21* 22 21*  GLUCOSE 250* 247*  --  255* 122* 141* 177*  BUN 8 8  --  6 7 8 10   CREATININE 0.53 0.50  --  0.64 0.68 0.61 0.59  CALCIUM 8.8*  --   --  7.9* 8.4* 8.3* 8.7*  MG  --   --   --   --   --  1.6* 1.7  PHOS  --   --   --   --   --  2.8 2.9   GFR: Estimated Creatinine Clearance: 91.7 mL/min (by C-G formula based on SCr of 0.59 mg/dL). Recent Labs  Lab 03/02/19 0823 03/02/19 1251 03/02/19 1432 03/03/19 0846 03/03/19 1453  WBC 16.9* 22.4*  --   --  20.0*  LATICACIDVEN  --   --  3.4* 1.8  --     Liver Function Tests: Recent Labs  Lab 03/02/19 0823 03/03/19 0846  AST 8* 17  ALT 17 17  ALKPHOS 50 47  BILITOT 0.7 0.8  PROT 6.7 5.9*  ALBUMIN 3.6 3.1*   No results for input(s): LIPASE, AMYLASE in the last 168 hours. No results for input(s): AMMONIA in the last 168 hours.  ABG    Component Value Date/Time   PHART 7.282 (L) 03/02/2019 1254   PCO2ART 39.9 03/02/2019 1254   PO2ART 216.0 (H) 03/02/2019 1254   HCO3 18.9 (L) 03/02/2019 1254   TCO2 20 (L) 03/02/2019 1254   ACIDBASEDEF 8.0 (H) 03/02/2019 1254   O2SAT 100.0 03/02/2019 1254     Coagulation Profile: Recent Labs  Lab 03/02/19 0823  INR 1.0    Cardiac Enzymes: Recent Labs  Lab 03/02/19 1255  CKTOTAL 65  CKMB 1.8    HbA1C: Hgb A1c MFr Bld   Date/Time Value Ref Range Status  03/03/2019 02:46 PM 9.0 (H) 4.8 - 5.6 % Final    Comment:    (NOTE) Pre diabetes:          5.7%-6.4% Diabetes:              >6.4% Glycemic control for   <7.0% adults with diabetes   03/02/2019 08:23 AM 9.2 (H) 4.8 - 5.6 % Final    Comment:    (NOTE) Pre diabetes:          5.7%-6.4% Diabetes:              >6.4% Glycemic control for   <7.0% adults with diabetes  CBG: Recent Labs  Lab 03/03/19 1525 03/03/19 1948 03/03/19 2304 03/04/19 0306 03/04/19 0729  GLUCAP 143* 193* [place]*    Kipp Brood, MD [facility] ICU Physician [place]  Pager: [phone] Mobile: [phone] After hours: [phone].  03/04/2019, 3:23 PM

## 2019-03-04 NOTE — Progress Notes (Signed)
Initial Nutrition Assessment RD working remotely.  DOCUMENTATION CODES:   Obesity unspecified  INTERVENTION:    Glucerna 1.2 at 30 ml/h, increase by 10 ml every 4 hours to goal rate of 60 ml/h.   Provides 1728 kcal, 86 gm protein, 1159 ml free water daily.  NUTRITION DIAGNOSIS:   Inadequate oral intake related to dysphagia, lethargy/confusion as evidenced by NPO status.  GOAL:   Patient will meet greater than or equal to 90% of their needs  MONITOR:   TF tolerance, Diet advancement, Labs, Skin  REASON FOR ASSESSMENT:   Consult Enteral/tube feeding initiation and management  ASSESSMENT:   48 yo female admitted with left-sided weakness, cerebral infarction due to embolism of right middle cerebral artery. S/P emergent angiogram with revascularization on admission. PMH includes DM, breast cancer, smoker, osteopenia, osteoporosis, HLD.   SLP following for dysphagia. Remains NPO at this time. Received MD Consult for TF initiation and management. 16 FR NG tube in place.  Labs reviewed. Calcium 8.7 (L), corrected calcium WNL CBG's: 164-202  Medications reviewed and include novolog, lantus.  NUTRITION - FOCUSED PHYSICAL EXAM:  unable to complete  Diet Order:   Diet Order            Diet NPO time specified  Diet effective now              EDUCATION NEEDS:   Not appropriate for education at this time  Skin:  Skin Assessment: Skin Integrity Issues: Skin Integrity Issues:: Incisions Incisions: R groin  Last BM:  no BM documented  Height:   Ht Readings from Last 1 Encounters:  03/02/19 5\' 4"  (1.626 m)    Weight:   Wt Readings from Last 1 Encounters:  03/02/19 83.3 kg    Ideal Body Weight:  54.5 kg  BMI:  Body mass index is 31.52 kg/m.  Estimated Nutritional Needs:   Kcal:  1600-1800  Protein:  80-90 gm  Fluid:  >/= 1.6 L    Molli Barrows, RD, LDN, [place] Pager [phone] After Hours Pager [phone]

## 2019-03-04 NOTE — Progress Notes (Signed)
STROKE TEAM PROGRESS NOTE   INTERVAL HISTORY Patient was unable to complete either CT scan or MRI yesterday due to agitation despite being sedated with IV Haldol and Ativan.  She has been restless and agitated in bed wanting to eat or drink.  She has a NG tube and has been given her medicines through that.  She will be evaluated this morning by speech therapy for swallow eval.  Vitals:   03/04/19 0800 03/04/19 0900 03/04/19 1000 03/04/19 1100  BP: (!) 159/78 (!) 142/67 (!) 157/66 (!) 178/83  Pulse: 62 63 63 (!) 56  Resp:   (!) 30 (!) 24  Temp: 98.4 F (36.9 C)     TempSrc: Axillary     SpO2: 97% 97% 99% 97%  Weight:      Height:        CBC:  Recent Labs  Lab 03/02/19 0823  03/02/19 1251 03/02/19 1254 03/03/19 1453  WBC 16.9*  --  22.4*  --  20.0*  NEUTROABS 14.0*  --   --   --  15.4*  HGB 14.9   < > 14.2 13.6 12.4  HCT 44.6   < > 41.4 40.0 37.3  MCV 89.0  --  87.3  --  89.4  PLT 235  --  289  --  222   < > = values in this interval not displayed.    Basic Metabolic Panel:  Recent Labs  Lab 03/03/19 1453 03/04/19 0417  NA 141 140  K 3.3* 3.6  CL 113* 109  CO2 22 21*  GLUCOSE 141* 177*  BUN 8 10  CREATININE 0.61 0.59  CALCIUM 8.3* 8.7*  MG 1.6* 1.7  PHOS 2.8 2.9   Lipid Panel:     Component Value Date/Time   CHOL 166 03/03/2019 0446   TRIG 515 (H) 03/03/2019 0446   HDL 23 (L) 03/03/2019 0446   CHOLHDL 7.2 03/03/2019 0446   VLDL UNABLE TO CALCULATE IF TRIGLYCERIDE OVER 400 mg/dL 03/03/2019 0446   LDLCALC UNABLE TO CALCULATE IF TRIGLYCERIDE OVER 400 mg/dL 03/03/2019 0446   HgbA1c:  Lab Results  Component Value Date   HGBA1C 9.0 (H) 03/03/2019   Urine Drug Screen:     Component Value Date/Time   LABOPIA POSITIVE (A) 03/02/2019 1240   COCAINSCRNUR NONE DETECTED 03/02/2019 1240   LABBENZ NONE DETECTED 03/02/2019 1240   AMPHETMU POSITIVE (A) 03/02/2019 1240   THCU NONE DETECTED 03/02/2019 1240   LABBARB NONE DETECTED 03/02/2019 1240    Alcohol Level      Component Value Date/Time   ETH <10 03/02/2019 0823    IMAGING Dg Abd 1 View  Result Date: 03/03/2019 CLINICAL DATA:  Nasogastric tube placement. EXAM: ABDOMEN - 1 VIEW COMPARISON:  One view abdomen 03/02/2019. FINDINGS: 1640 hours. Tip of the nasogastric tube is in the right upper quadrant of the abdomen, likely in the distal stomach or proximal duodenum. The visualized bowel gas pattern is normal. There are no suspicious abdominal calcifications or acute osseous findings. IMPRESSION: Nasogastric tube tip in the distal stomach or proximal duodenum. Electronically Signed   By: Richardean Sale M.D.   On: 03/03/2019 16:54   Dg Abd 1 View  Result Date: 03/02/2019 CLINICAL DATA:  NG tube placement EXAM: ABDOMEN - 1 VIEW COMPARISON:  None. FINDINGS: Esophageal tube tip overlies the gastric body. Nonobstructed bowel-gas pattern IMPRESSION: Esophageal tube tip overlies the gastric body Electronically Signed   By: Donavan Foil M.D.   On: 03/02/2019 19:04   Ct Head  Wo Contrast  Result Date: 03/02/2019 CLINICAL DATA:  Status post revascularization of the occluded right ICA terminus. EXAM: CT HEAD WITHOUT CONTRAST TECHNIQUE: Contiguous axial images were obtained from the base of the skull through the vertex without intravenous contrast. COMPARISON:  CT head and CT perfusion 03/02/2019 FINDINGS: Brain: There is contrast staining within the core infarct area of the right lentiform nucleus. There is also some contrast staining within the body of the right caudate lobe. No other focal areas of contrast enhancement is present. Gray-white differentiation is otherwise normal. Acute hemorrhage or mass lesion is present. There is some mass effect on the right lateral ventricle. Vascular: No hyperdense vessel or unexpected calcification.  The Skull: Calvarium is intact. No focal lytic or blastic lesions are present. Sinuses/Orbits: Mild mucosal thickening is present inferior right maxillary sinus. Is scattered  ethmoid opacification and some mucosal thickening in the inferior right frontal sinus. No fluid levels are present. Mastoid air cells are clear. Globes and orbits are within normal limits. IMPRESSION: 1. Contrast staining in the right lentiform nucleus and caudate following revascularization. This is consistent with areas infarction. 2. Remainder of right MCA territory is within normal limits. 3. No acute hemorrhage. 4. Mild sinus disease. Electronically Signed   By: San Morelle M.D.   On: 03/02/2019 14:08   Cerebral Angio  01/30/2019 S/P rt COMMON CAROTID ARTERIOGRAM,FOLLOWED BY COMPLETE REVASCULAR IZATION OF OCCLUDED  RT ICA TERMINUS,RT MCA AND RT ACA WITH X PASS WITH 4MMX 40 MM SOLITAIRE X fr RETRIEVER DEVICE AND PENUMBRA ASPIRATION ACHIEVING A tici 2c REVASCULARIZATION . S/P STENT ASSISTED ANGIOPLASTY OF SYMPTOTMATIC PROX RT ICA STENOSIS.Marland Kitchen  EEG 03/02/2019 This study is suggestive of profound diffuse encephalopathy, non specific to etiology but could be secondary to sedation. No seizures or epileptiform discharges were seen throughout the recording.  2D Echocardiogram  1. Left ventricular ejection fraction, by visual estimation, is 60 to 65%. The left ventricle has normal function. There is no left ventricular hypertrophy.  2. The left ventricle has no regional wall motion abnormalities.  3. Global right ventricle has normal systolic function.The right ventricular size is normal. No increase in right ventricular wall thickness.  4. Left atrial size was normal.  5. Right atrial size was normal.  6. The mitral valve is normal in structure. No evidence of mitral valve regurgitation. No evidence of mitral stenosis.  7. The tricuspid valve is normal in structure. Tricuspid valve regurgitation is not demonstrated.  8. The aortic valve is normal in structure. Aortic valve regurgitation is not visualized. No evidence of aortic valve sclerosis or stenosis.  9. The pulmonic valve was normal in  structure. Pulmonic valve regurgitation is not visualized. 10. Normal pulmonary artery systolic pressure. 11. The inferior vena cava is dilated in size with >50% respiratory variability, suggesting right atrial pressure of 8 mmHg.  PHYSICAL EXAM   Middle-aged Caucasian lady is not in distress but appears restless and slightly agitated. . Afebrile. Head is nontraumatic. Neck is supple without bruit.    Cardiac exam no murmur or gallop. Lungs are clear to auscultation. Distal pulses are well felt. Neurological Exam :  Patient is drowsy but can arouse easily.  She has right gaze preference and can look partially to the left.  She has dense left homonymous hemianopsia.  She has dysarthria but can be easily understood.  She follows commands well.  There is left lower facial weakness.  There is left hemineglect.  There is left dense hemiplegia with 1/5 left upper extremity  and 2/5 left lower extremity strength.  She has normal strength on the right.  Sensation is diminished on the left.  Tone is diminished on the left normal on the right.  Plantars left is upgoing right is downgoing. ASSESSMENT/PLAN Ann Lopez is a 48 y.o. female with history of DB, HLD and breast cancer who woke with L sided weakness, R gaze and neuro neglect. CT showed a large penumbra w/ small core and R M1 occlusion taken for mechanical thrombectomy.   Stroke:   R MCA infarct s/p IR w/ R ICA stent placement in setting of large vessel disease  Code Stroke CT head No acute abnormality.  CTA head R ICA terminus near occlusion extending into R M1 and R A1. High grade stenoses R M2 branches.  CTA neck R ICA origin/carotid bulb hypodensity w/ high grade stenosis. 1.7cm L thyroid nodule.  CT perfusion R MCA 25mL core infarct w/ 7mL penumbra. Mismatch vol 65 mL.  Cerebral Angio R ICA, R MCA and R AVA occlusion w/ TICI2C revascularization w/ solitaire and penumbra. Stent angioplasty R ICA stenosis    Post IR CT contrast blush in  the caudate and putamen nuclei. NO gross mass effect or ICH seen.  MRI  (clausophobia, given Haldol and ativan on call). cancel MRI   Repeat CT head to confirm stroke w/ sedation plan for later today  2D Echo EF 60-65%. No source of embolus   EEG neg sz  LDL UTC d/t TG 515. direct LDL pending   HgbA1c 9.2  SCDs for VTE prophylaxis  aspirin 81 mg daily prior to admission, now on aspirin 81 mg daily and Brilinta (ticagrelor) 90 mg bid. Continue DAPT.  Therapy recommendations:  CIR. Consult placed.    Disposition:  pending   Transfer to the floor  Hypotension, resolved  Home meds:  On lasix, no hx HTN Off neo BP goal < 180 BP 150s  (BP cuff on leg d/t double mastectomies)  Hyperlipidemia  Home meds:  tricor 145  LDL UTC d/t high TG 515, LDL goal < 70. TC 166   Check direct LDL pending   Consider statin based on LDL results  Diabetes type II Uncontrolled  Home meds:  lantus 20    Now on lantus 10  HgbA1c 9.2, goal < 7.0  CBGs  SSI  Increase lantus. Continue towards home dose if po intake good today  Dysphagia . Secondary to stroke . Speech on board . Cleared for D2 thin . D/c NG placed yesterday   Other Stroke Risk Factors  Cigarette smoker, advised to stop smoking  Substance abuse - pt denies use of speed, is on narcs for chronic pain - UDS positive for amphetamines and opiates   Obesity, Body mass index is 31.52 kg/m., recommend weight loss, diet and exercise as appropriate   Migraine. Seen by DDr. Krista Blue in 11/2014 and put on topamax 50 bid and maxalt prn  Other Active Problems  Breast cancer 2009 s/p surgical  Post chemo memory deficit. Seen by Dr. Tomi Likens who recommended neuro psych testing. No f/u since 08/2015    1.7cm L thyroid nodule - non emergent [place] recommended for f/u  Anxiety/depression. On Cymbalta  Lactic acid 3.4. UCx w/ 50K ecoli. No tx at this time.  Hypokalemia, resolved 4.0->3.2->3.3->3.6   Patient agitated, constantly  moving. Per family, behavior is abnormal at baseline. Continue to monitor.   Leukocytosis WBC 22.4->20.0  Hospital day # 2 Patient has been quite restless and agitated  and not cooperated with follow-up imaging studies.  Discussed with critical care medicine.  Plan starting her on IV Precedex drip and getting imaging done when she is sedated.  Discontinue the drip later today.  Speech therapy swallow eval of and hopefully start her on a diet.  Mobilize out of bed.  Physical occupational therapy consults and rehab consults.  May consider transfer to floor later when she is off sedation.  Discussed with patient and Dr. Lynetta Mare critical care medicine.  Greater than 50% time during this 35-minute visit was spent on counseling and coordination of care and discussion with care team  Antony Contras, MD Medical Director [facility] Pager: [phone] 03/04/2019 12:05 PM   To contact Stroke Continuity provider, please refer to [email or web address] After hours, contact General Neurology

## 2019-03-04 NOTE — Progress Notes (Signed)
Rehab Admissions Coordinator Note:  Per SLP recommendation, patient was screened by Michel Santee for appropriateness for an Inpatient Acute Rehab Consult.  Await evaluations from either PT or OT to determine if pt has needs across >1 discipline for CIR.    Michel Santee 03/04/2019, 9:54 AM  I can be reached at [phone]

## 2019-03-04 NOTE — Progress Notes (Signed)
Inpatient Rehab Admissions:  Inpatient Rehab Consult received.  Attempted to meet pt at bedside but she was off the floor for testing.  We will follow up for CIR assessment on Friday.   Signed: Shann Medal, PT, DPT Admissions Coordinator [phone] 03/04/19  5:16 PM

## 2019-03-04 NOTE — Evaluation (Signed)
Occupational Therapy Evaluation Patient Details Name: Ann Lopez MRN: [id number] DOB: 10/30/1970 Today's Date: 03/04/2019    History of Present Illness 48 yo female admitted 03/02/19 with R MCA s/p fall from commode due to L side weakness NIH 12 R gaze preference and L neglect. pt under went R common carotid aterigogram complete revascularixation of R ICA  R MCA and R ACA via emergent mechanical thrombectomy  PMH Breast CA DM asthma depression current smoker gastric bypass 2013 IBS   Clinical Impression   PT admitted with R MCA CVA. Pt currently with functional limitiations due to the deficits listed below (see OT problem list). Pt currently min - mod (A) sitting eob with R neck rotation, weakness L side of core and pulling with R UE on foot board of bed. Pt total +2 max to complete SIt<>stand. Pt verbalizes during session and follows commands with increase time. Pt keeping eyes closes majority of session with extreme R gaze preference noted nystagmus.  Pt will benefit from skilled OT to increase their independence and safety with adls and balance to allow discharge CIR.     Follow Up Recommendations  CIR    Equipment Recommendations  3 in 1 bedside commode;Wheelchair (measurements OT);Wheelchair cushion (measurements OT)    Recommendations for Other Services Rehab consult     Precautions / Restrictions Precautions Precautions: Fall      Mobility Bed Mobility Overal bed mobility: Needs Assistance Bed Mobility: Rolling;Sidelying to Sit;Sit to Sidelying Rolling: Total assist;+2 for safety/equipment;+2 for physical assistance Sidelying to sit: Total assist;+2 for physical assistance;+2 for safety/equipment     Sit to sidelying: Total assist;+2 for physical assistance;+2 for safety/equipment General bed mobility comments: arousable, but very sleepy  Transfers Overall transfer level: Needs assistance   Transfers: Sit to/from Stand Sit to Stand: Mod assist;+2 physical  assistance;+2 safety/equipment;From elevated surface(from ICU bed)         General transfer comment: pt initiated coming forward as if to stand; once therapists in position (blocking each knee) pt able to come to near fully standing with left knee giving, but not flacid/buckling    Balance Overall balance assessment: Needs assistance Sitting-balance support: No upper extremity supported;Feet unsupported Sitting balance-Leahy Scale: Zero   Postural control: Left lateral lean Standing balance support: No upper extremity supported Standing balance-Leahy Scale: Zero Standing balance comment: not fully extending hips or knees, head hanging forward                           ADL either performed or assessed with clinical judgement   ADL Overall ADL's : Needs assistance/impaired Eating/Feeding: NPO Eating/Feeding Details (indicate cue type and reason): fixated on drinking and taking small sip of water during session Grooming: Maximal assistance   Upper Body Bathing: Maximal assistance   Lower Body Bathing: Total assistance   Upper Body Dressing : Maximal assistance   Lower Body Dressing: Total assistance   Toilet Transfer: +2 for physical assistance;Maximal assistance             General ADL Comments: pt initiates sit<Stand with LLE blocked and facilitation at core for upright posture     Vision   Additional Comments: noted to R gaze with nystagmus but not sustaining eyes open long enough to assess further     Perception     Praxis      Pertinent Vitals/Pain Pain Assessment: No/denies pain Faces Pain Scale: No hurt Pain Intervention(s): Monitored during session;Repositioned  Hand Dominance Right   Extremity/Trunk Assessment Upper Extremity Assessment Upper Extremity Assessment: RUE deficits/detail;LUE deficits/detail RUE Deficits / Details: AROM in all planes LUE Deficits / Details: noted to adducted toward chest, no attempt to reposition,  spontaneous hand movement. pt squeezing with palm pressure   Lower Extremity Assessment Lower Extremity Assessment: Defer to PT evaluation LLE Deficits / Details: noted to have supination of foot with don of socks   Cervical / Trunk Assessment Cervical / Trunk Assessment: Other exceptions Cervical / Trunk Exceptions: obese   Communication Communication Communication: No difficulties   Cognition Arousal/Alertness: Lethargic Behavior During Therapy: Flat affect(progressed to restless) Overall Cognitive Status: Difficult to assess Area of Impairment: Attention;Following commands;Awareness                   Current Attention Level: Sustained   Following Commands: Follows one step commands inconsistently;Follows one step commands with increased time   Awareness: Intellectual   General Comments: pt required max verbal and tactile stimulation to stay awake (even at EOB) with frequent eye-closing while speaking   General Comments  no sedation this AM in prep for therapy arrival. positined in chair position    Exercises     Shoulder Instructions      Home Living Family/patient expects to be discharged to:: Private residence Living Arrangements: Spouse/significant other;Children Available Help at Discharge: Available 24 hours/day Type of Home: Mobile home Home Access: Stairs to enter [employer] of Steps: 3 Entrance Stairs-Rails: Can reach both Home Layout: One level                   Additional Comments: information per chart from PT session. pt does not sustain attention to questions. pt does report she needs to get out of here to work to pay her mortage she cant loose her house      Prior Functioning/Environment Level of Independence: Independent                 OT Problem List: Decreased strength;Decreased activity tolerance;Impaired balance (sitting and/or standing);Decreased coordination;Decreased cognition;Decreased safety  awareness;Decreased knowledge of use of DME or AE;Decreased knowledge of precautions;Pain;Impaired UE functional use;Obesity;Impaired tone;Impaired sensation;Impaired vision/perception      OT Treatment/Interventions: Self-care/ADL training;Therapeutic exercise;Neuromuscular education;Energy conservation;DME and/or AE instruction;Manual therapy;Modalities;Therapeutic activities;Cognitive remediation/compensation;Visual/perceptual remediation/compensation;Patient/family education;Balance training    OT Goals(Current goals can be found in the care plan section) Acute Rehab OT Goals Patient Stated Goal: unable OT Goal Formulation: Patient unable to participate in goal setting Time For Goal Achievement: 03/18/19 Potential to Achieve Goals: Good  OT Frequency: Min 2X/week   Barriers to D/C: Decreased caregiver support          Co-evaluation PT/OT/SLP Co-Evaluation/Treatment: Yes Reason for Co-Treatment: Complexity of the patient's impairments (multi-system involvement);Necessary to address cognition/behavior during functional activity;For patient/therapist safety;To address functional/ADL transfers PT goals addressed during session: Mobility/safety with mobility;Balance OT goals addressed during session: ADL's and self-care;Proper use of Adaptive equipment and DME;Strengthening/ROM      AM-PAC OT "6 Clicks" Daily Activity     Outcome Measure Help from another person eating meals?: Total Help from another person taking care of personal grooming?: Total Help from another person toileting, which includes using toliet, bedpan, or urinal?: Total Help from another person bathing (including washing, rinsing, drying)?: Total Help from another person to put on and taking off regular upper body clothing?: Total Help from another person to put on and taking off regular lower body clothing?: Total 6 Click Score: 6   End of  Session Equipment Utilized During Treatment: Gait belt Nurse Communication:  Mobility status;Precautions  Activity Tolerance: Patient tolerated treatment well Patient left: in bed;with call bell/phone within reach;with bed alarm set;with nursing/sitter in room;with restraints reapplied;with SCD's reapplied  OT Visit Diagnosis: Unsteadiness on feet (R26.81);Muscle weakness (generalized) (M62.81)                Time: 1037-1100 OT Time Calculation (min): 23 min Charges:  OT General Charges $OT Visit: 1 Visit OT Evaluation $OT Eval Moderate Complexity: 1 Mod   Brynn, OTR/L  Acute Rehabilitation Services Pager: [phone] Office: [phone] .   Jeri Modena 03/04/2019, 12:43 PM

## 2019-03-04 NOTE — Consult Note (Signed)
Physical Medicine and Rehabilitation Consult   Reason for Consult: Stroke with functional deficits.  Referring Physician: Dr.    Harrison Mons: AMBRI MILTNER is a 48 y.o. female with history of T2DM, HTN, breast cancer s/p bilateral mastectomy, post chemo memory deficits,  back pain presented on 03/02/2019 after a fall from who was admitted 03/02/19 after a fall from the commode and noted to have left-sided weakness, neglect, and right gaze preference.  History taken from chart review and nursing due to lethargy.  UDS was positive for opioids and amphetamines.  CTA/P head done revealing near occlusive thrombus within right ICA terminus extending into M1 R-MCA and A1 R-ACA  with core infarct R-MCA and large penumbra. She was taken for cerbral angio with emergent thrombectomy and complete revascularization of occluded R-ICA terminus, R-MCA and R-ACA with stent assisted angioplasty of symptomatic proximal R-ICA stenosis by Dr. Estanislado Pandy.  Post procedure with hypotension felt to be due to carotid manipulation. She did have decrease in LOC as well as increase in tone LLE post procedure with concerns of seizure.  EEG performed, showing diffuse encephalopathy.  Repeat head CT ordered, personally reviewed, stable/improved.  Echocardiogram showing ejection fraction of 60 to 65% with no wall motion abnormalities  She tolerated extubation without difficulty but has had issues bouts of agitation with lethargy.  She was started on dysphagia to thin liquids when awake as lethargy increases aspiration risk. Therapy evaluation completed today and patient limited by waxing/waning of MS limiting cognitive evaluation, significant facial weakness with increase WOB,  right gaze preference with left neglect, LLE weakness.  Hospital course further complicated by tachypnea, fevers, leukocytosis, hypokalemia. CIR recommended due to functional deficits.   Review of Systems  Unable to perform ROS: Mental acuity   Past Medical  History:  Diagnosis Date  . Allergy   . Anxiety   . Asthma   . Back pain   . Breast cancer ([place])   . Cancer ([place])   . Depression   . Diabetes mellitus   . Headache   . Hyperlipemia   . Osteopenia   . Osteoporosis    osteopenia    Past Surgical History:  Procedure Laterality Date  . ABDOMINAL HYSTERECTOMY     B oophorectomy for BRCA1 gene  . BREAST SURGERY    . CESAREAN SECTION    . INCISION AND DRAINAGE ABSCESS Left 11/14/2012   Procedure: INCISION AND DRAINAGE ABSCESS LEFT GROIN;  Surgeon: Jamesetta So, MD;  Location: [facility];  Service: General;  Laterality: Left;  . LYMPHADENECTOMY    . MASTECTOMY Bilateral   . RADIOLOGY WITH ANESTHESIA N/A 03/02/2019   Procedure: IR WITH ANESTHESIA;  Surgeon: Luanne Bras, MD;  Location: [facility];  Service: Radiology;  Laterality: N/A;    Family History  Problem Relation Age of Onset  . Cancer Mother 34       breast cancer  . Hypertension Sister   . Hypothyroidism Sister   . Cancer Sister   . Hypothyroidism Brother   . Cancer Maternal Grandmother 20       breast cancer  . Cancer Maternal Grandfather 63       pancreatic cancer    Social History:  Per reports she has been smoking cigarettes. She has been smoking about 0.50 packs per day. She has never used smokeless tobacco. Per reports she does not drink alcohol or use drugs.    Allergies  Allergen Reactions  . Azithromycin Other (See Comments)  Unknown reaction  . Morphine And Related Other (See Comments)    Unknown reaction - Can tolerate hydrocodone and dilaudid without side effects  . Nitrofurantoin Other (See Comments)    Unknown reaction  . Penicillins Hives and Itching    Has patient had a PCN reaction causing immediate rash, facial/tongue/throat swelling, SOB or lightheadedness with hypotension: Yes Has patient had a PCN reaction causing severe rash involving mucus membranes or skin necrosis: No Has patient had a PCN reaction that required hospitalization No  Has patient had a PCN reaction occurring within the last 10 years: No If all of the above answers are "NO", then may proceed with Cephalosporin use.   . Sulfonamide Derivatives Hives    Medications Prior to Admission  Medication Sig Dispense Refill  . HYDROcodone-acetaminophen (NORCO) 10-325 MG tablet Take 1 tablet by mouth every 6 (six) hours as needed.    . hydrOXYzine (ATARAX/VISTARIL) 10 MG tablet Take 10 mg by mouth 3 (three) times daily.    Marland Kitchen albuterol (PROVENTIL HFA;VENTOLIN HFA) 108 (90 Base) MCG/ACT inhaler Inhale 1 puff into the lungs every 6 (six) hours as needed for wheezing or shortness of breath. 1 Inhaler 6  . albuterol (PROVENTIL) (2.5 MG/3ML) 0.083% nebulizer solution Take 3 mLs (2.5 mg total) by nebulization every 6 (six) hours as needed for wheezing or shortness of breath. 75 mL 12  . aspirin EC 81 MG tablet Take 1 tablet (81 mg total) by mouth daily. 30 tablet 0  . canagliflozin (INVOKANA) 300 MG TABS tablet Take 300 mg by mouth daily before breakfast.    . Choline Fenofibrate (FENOFIBRIC ACID) 135 MG CPDR Take 1 capsule by mouth daily.    . clonazePAM (KLONOPIN) 0.5 MG tablet Take 0.25 mg by mouth 2 (two) times daily as needed for anxiety.     . divalproex (DEPAKOTE ER) 250 MG 24 hr tablet Take 250 mg by mouth 2 (two) times daily.    . DULoxetine (CYMBALTA) 60 MG capsule Take 2 capsules (120 mg total) by mouth daily. (Patient taking differently: Take 60 mg by mouth daily. ) 180 capsule 3  . fenofibrate (TRICOR) 145 MG tablet Take 1 tablet (145 mg total) by mouth daily. 90 tablet 1  . furosemide (LASIX) 20 MG tablet Take 20 mg by mouth.    Marland Kitchen glucose blood (CHOICE DM FORA G20 TEST STRIPS) test strip Use as instructed 100 each 12  . ibuprofen (ADVIL,MOTRIN) 200 MG tablet Take 800 mg by mouth every 6 (six) hours as needed for fever. Reported on 07/28/2015    . Insulin Glargine (LANTUS SOLOSTAR) 100 UNIT/ML Solostar Pen Inject 20 Units into the skin daily at 10 pm. 5 pen PRN  .  Insulin Pen Needle 32G X 4 MM MISC 1 application by Does not apply route daily. 100 each 6  . Lancets (ONETOUCH ULTRASOFT) lancets Use as instructed; check sugar once daily 100 each 12  . mometasone (NASONEX) 50 MCG/ACT nasal spray Place 2 sprays into the nose daily. 17 g 12  . naproxen (NAPROSYN) 500 MG tablet Take 1 tablet (500 mg total) by mouth 2 (two) times daily. 20 tablet 0  . omeprazole (PRILOSEC) 40 MG capsule Take 1 capsule (40 mg total) by mouth daily. 30 capsule 5    Home: Home Living Family/patient expects to be discharged to:: Private residence Living Arrangements: Spouse/significant other, Children Additional Comments: information per chart due to pt too lethargic to address  Functional History: Prior Function Level of Independence: Independent Functional Status:  Mobility: Bed Mobility Overal bed mobility: Needs Assistance Bed Mobility: Rolling, Sidelying to Sit, Sit to Sidelying Rolling: Total assist, +2 for safety/equipment, +2 for physical assistance Sidelying to sit: Total assist, +2 for physical assistance, +2 for safety/equipment Sit to sidelying: Total assist, +2 for physical assistance, +2 for safety/equipment General bed mobility comments: arousable, but very sleepy Transfers Overall transfer level: Needs assistance Transfers: Sit to/from Stand Sit to Stand: Mod assist, +2 physical assistance, +2 safety/equipment, From elevated surface(from ICU bed) General transfer comment: pt initiated coming forward as if to stand; once therapists in position (blocking each knee) pt able to come to near fully standing with left knee giving, but not flacid/buckling      ADL:    Cognition: Cognition Overall Cognitive Status: Difficult to assess Orientation Level: Oriented X4 Cognition Arousal/Alertness: Lethargic Behavior During Therapy: Flat affect(progressed to restless) Overall Cognitive Status: Difficult to assess Area of Impairment: Attention, Following  commands, Awareness Current Attention Level: Sustained Following Commands: Follows one step commands inconsistently, Follows one step commands with increased time Awareness: Intellectual General Comments: pt required max verbal and tactile stimulation to stay awake (even at EOB) with frequent eye-closing while speaking Difficult to assess due to: Level of arousal   Blood pressure (!) 178/83, pulse (!) 56, temperature (!) 101.7 F (38.7 C), temperature source Axillary, resp. rate (!) 24, height 5' 4"  (1.626 m), weight 83.3 kg, SpO2 97 %. Physical Exam  Vitals reviewed. Constitutional: She appears well-developed.  Obese Patient recently received Precedex per nursing  HENT:  Head: Normocephalic and atraumatic.  + NG  Eyes: Right eye exhibits no discharge. Left eye exhibits no discharge.  Keeps eyes closed  Neck: No tracheal deviation present. No thyromegaly present.  Respiratory: Effort normal. No respiratory distress.  GI: She exhibits no distension.  Musculoskeletal:     Comments: Right hand edema and tenderness  Neurological:  Extremely lethargic, difficult to arouse (recently received medications) Some movements noted on right side to painful stimuli, no movements noted on left  Skin:  Right hand with erythema  Psychiatric:  Unable to assess due to lethargy    Results for orders placed or performed during the hospital encounter of 03/02/19 (from the past 24 hour(s))  Hemoglobin A1c     Status: Abnormal   Collection Time: 03/03/19  2:46 PM  Result Value Ref Range   Hgb A1c MFr Bld 9.0 (H) 4.8 - 5.6 %   Mean Plasma Glucose 211.6 mg/dL  CBC with Differential/Platelet     Status: Abnormal   Collection Time: 03/03/19  2:53 PM  Result Value Ref Range   WBC 20.0 (H) 4.0 - 10.5 K/uL   RBC 4.17 3.87 - 5.11 MIL/uL   Hemoglobin 12.4 12.0 - 15.0 g/dL   HCT 37.3 36.0 - 46.0 %   MCV 89.4 80.0 - 100.0 fL   MCH 29.7 26.0 - 34.0 pg   MCHC 33.2 30.0 - 36.0 g/dL   RDW 14.3 11.5 - 15.5  %   Platelets 222 150 - 400 K/uL   nRBC 0.0 0.0 - 0.2 %   Neutrophils Relative % 77 %   Neutro Abs 15.4 (H) 1.7 - 7.7 K/uL   Lymphocytes Relative 15 %   Lymphs Abs 3.1 0.7 - 4.0 K/uL   Monocytes Relative 7 %   Monocytes Absolute 1.4 (H) 0.1 - 1.0 K/uL   Eosinophils Relative 0 %   Eosinophils Absolute 0.0 0.0 - 0.5 K/uL   Basophils Relative 0 %   Basophils Absolute  0.1 0.0 - 0.1 K/uL   Immature Granulocytes 1 %   Abs Immature Granulocytes 0.11 (H) 0.00 - 0.07 K/uL  Basic metabolic panel     Status: Abnormal   Collection Time: 03/03/19  2:53 PM  Result Value Ref Range   Sodium 141 135 - 145 mmol/L   Potassium 3.3 (L) 3.5 - 5.1 mmol/L   Chloride 113 (H) 98 - 111 mmol/L   CO2 22 22 - 32 mmol/L   Glucose, Bld 141 (H) 70 - 99 mg/dL   BUN 8 6 - 20 mg/dL   Creatinine, Ser 0.61 0.44 - 1.00 mg/dL   Calcium 8.3 (L) 8.9 - 10.3 mg/dL   GFR calc non Af Amer >60 >60 mL/min   GFR calc Af Amer >60 >60 mL/min   Anion gap 6 5 - 15  Magnesium     Status: Abnormal   Collection Time: 03/03/19  2:53 PM  Result Value Ref Range   Magnesium 1.6 (L) 1.7 - 2.4 mg/dL  Phosphorus     Status: None   Collection Time: 03/03/19  2:53 PM  Result Value Ref Range   Phosphorus 2.8 2.5 - 4.6 mg/dL  Glucose, capillary     Status: Abnormal   Collection Time: 03/03/19  3:25 PM  Result Value Ref Range   Glucose-Capillary 143 (H) 70 - 99 mg/dL   Comment 1 Notify RN    Comment 2 Document in Chart   Glucose, capillary     Status: Abnormal   Collection Time: 03/03/19  7:48 PM  Result Value Ref Range   Glucose-Capillary 193 (H) 70 - 99 mg/dL  Glucose, capillary     Status: Abnormal   Collection Time: 03/03/19 11:04 PM  Result Value Ref Range   Glucose-Capillary 163 (H) 70 - 99 mg/dL  Glucose, capillary     Status: Abnormal   Collection Time: 03/04/19  3:06 AM  Result Value Ref Range   Glucose-Capillary 164 (H) 70 - 99 mg/dL  AM Basic metabolic panel     Status: Abnormal   Collection Time: 03/04/19  4:17 AM   Result Value Ref Range   Sodium 140 135 - 145 mmol/L   Potassium 3.6 3.5 - 5.1 mmol/L   Chloride 109 98 - 111 mmol/L   CO2 21 (L) 22 - 32 mmol/L   Glucose, Bld 177 (H) 70 - 99 mg/dL   BUN 10 6 - 20 mg/dL   Creatinine, Ser 0.59 0.44 - 1.00 mg/dL   Calcium 8.7 (L) 8.9 - 10.3 mg/dL   GFR calc non Af Amer >60 >60 mL/min   GFR calc Af Amer >60 >60 mL/min   Anion gap 10 5 - 15  Magnesium     Status: None   Collection Time: 03/04/19  4:17 AM  Result Value Ref Range   Magnesium 1.7 1.7 - 2.4 mg/dL  Phosphorus     Status: None   Collection Time: 03/04/19  4:17 AM  Result Value Ref Range   Phosphorus 2.9 2.5 - 4.6 mg/dL  Glucose, capillary     Status: Abnormal   Collection Time: 03/04/19  7:29 AM  Result Value Ref Range   Glucose-Capillary 202 (H) 70 - 99 mg/dL   Comment 1 Notify RN    Comment 2 Document in Chart   Glucose, capillary     Status: Abnormal   Collection Time: 03/04/19 11:21 AM  Result Value Ref Range   Glucose-Capillary 195 (H) 70 - 99 mg/dL   Comment 1 Notify RN  Comment 2 Document in Chart    Dg Abd 1 View  Result Date: 03/03/2019 CLINICAL DATA:  Nasogastric tube placement. EXAM: ABDOMEN - 1 VIEW COMPARISON:  One view abdomen 03/02/2019. FINDINGS: 1640 hours. Tip of the nasogastric tube is in the right upper quadrant of the abdomen, likely in the distal stomach or proximal duodenum. The visualized bowel gas pattern is normal. There are no suspicious abdominal calcifications or acute osseous findings. IMPRESSION: Nasogastric tube tip in the distal stomach or proximal duodenum. Electronically Signed   By: Richardean Sale M.D.   On: 03/03/2019 16:54   Dg Abd 1 View  Result Date: 03/02/2019 CLINICAL DATA:  NG tube placement EXAM: ABDOMEN - 1 VIEW COMPARISON:  None. FINDINGS: Esophageal tube tip overlies the gastric body. Nonobstructed bowel-gas pattern IMPRESSION: Esophageal tube tip overlies the gastric body Electronically Signed   By: Donavan Foil M.D.   On:  03/02/2019 19:04   Ct Head Wo Contrast  Result Date: 03/02/2019 CLINICAL DATA:  Status post revascularization of the occluded right ICA terminus. EXAM: CT HEAD WITHOUT CONTRAST TECHNIQUE: Contiguous axial images were obtained from the base of the skull through the vertex without intravenous contrast. COMPARISON:  CT head and CT perfusion 03/02/2019 FINDINGS: Brain: There is contrast staining within the core infarct area of the right lentiform nucleus. There is also some contrast staining within the body of the right caudate lobe. No other focal areas of contrast enhancement is present. Gray-white differentiation is otherwise normal. Acute hemorrhage or mass lesion is present. There is some mass effect on the right lateral ventricle. Vascular: No hyperdense vessel or unexpected calcification.  The Skull: Calvarium is intact. No focal lytic or blastic lesions are present. Sinuses/Orbits: Mild mucosal thickening is present inferior right maxillary sinus. Is scattered ethmoid opacification and some mucosal thickening in the inferior right frontal sinus. No fluid levels are present. Mastoid air cells are clear. Globes and orbits are within normal limits. IMPRESSION: 1. Contrast staining in the right lentiform nucleus and caudate following revascularization. This is consistent with areas infarction. 2. Remainder of right MCA territory is within normal limits. 3. No acute hemorrhage. 4. Mild sinus disease. Electronically Signed   By: San Morelle M.D.   On: 03/02/2019 14:08    Assessment/Plan: Diagnosis: Right ICA occlusion Labs and images (see above) independently reviewed.  Records reviewed and summated above. Stroke: Continue secondary stroke prophylaxis and Risk Factor Modification listed below:   Antiplatelet therapy:   Blood Pressure Management:  Continue current medication with prn's with permisive HTN per primary team Statin Agent:   Diabetes management:   Tobacco abuse: Counseled on  appropriate Left sided hemiparesis: fit for orthosis to prevent contractures (resting hand splint for day, wrist cock up splint at night, PRAFO, etc) PT/OT for mobility, ADL training  Motor recovery: Fluoxetine  1. Does the need for close, 24 hr/day medical supervision in concert with the patient's rehab needs make it unreasonable for this patient to be served in a less intensive setting? Yes  2. Co-Morbidities requiring supervision/potential complications: tachypnea (monitor RR and O2 Sats with increased physical exertion), SIRS with fevers and leukocytosis (repeat labs, cont to monitor for signs and symptoms of infection, further workup if indicated), hypokalemia (continue to monitor and replete as necessary), T2 DM (Monitor in accordance with exercise and adjust meds as necessary), HTN (transitioned from IV to oral meds when appropriate, monitor and provide prns in accordance with increased physical exertion and pain), breast cancer s/p bilateral mastectomy, post chemo  memory deficits,  back pain, post stroke dysphagia (advance diet as tolerated). 3. Due to bladder management, bowel management, safety, disease management, medication administration and patient education, does the patient require 24 hr/day rehab nursing? Yes 4. Does the patient require coordinated care of a physician, rehab nurse, therapy disciplines of PT/OT/SLP.  To address physical and functional deficits in the context of the above medical diagnosis(es)? Yes Addressing deficits in the following areas: balance, endurance, locomotion, strength, transferring, bathing, dressing, toileting, cognition, speech, swallowing and psychosocial support 5. Can the patient actively participate in an intensive therapy program of at least 3 hrs of therapy per day at least 5 days per week? Potentially 6. The potential for patient to make measurable gains while on inpatient rehab is excellent 7. Anticipated functional outcomes upon discharge from  inpatient rehab are min assist  with PT, min assist with OT, supervision and min assist with SLP. 8. Estimated rehab length of stay to reach the above functional goals is: 18-22 days. 9. Anticipated discharge destination: Home 10. Overall Rehab/Functional Prognosis: good  RECOMMENDATIONS: This patient's condition is appropriate for continued rehabilitative care in the following setting: CIR when medically stable and able to tolerate 3 hours of therapy a day. Patient has agreed to participate in recommended program. Potentially Note that insurance prior authorization may be required for reimbursement for recommended care.  Comment: Rehab Admissions Coordinator to follow up.  I have personally performed a face to face diagnostic evaluation, including, but not limited to relevant history and physical exam findings, of this patient and developed relevant assessment and plan.  Additionally, I have reviewed and concur with the physician assistant's documentation above.   Delice Lesch, MD, ABPMR Bary Leriche, PA-C 03/04/2019

## 2019-03-04 NOTE — Progress Notes (Signed)
Referring Physician(s): DR Leonie Man  Supervising Physician: Luanne Bras  Patient Status:  [facility] - In-pt  Chief Complaint:  CVA  Subjective:  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 Estanislado Pandy.  Pt still agitated in bed Extubated Answering some questions--- but not focused    Allergies: Azithromycin, Morphine and related, Nitrofurantoin, Penicillins, and Sulfonamide derivatives  Medications: Prior to Admission medications   Medication Sig Start Date End Date Taking? Authorizing Provider  HYDROcodone-acetaminophen (NORCO) 10-325 MG tablet Take 1 tablet by mouth every 6 (six) hours as needed. 02/03/19  Yes [provider]  hydrOXYzine (ATARAX/VISTARIL) 10 MG tablet Take 10 mg by mouth 3 (three) times daily. 02/24/19  Yes [provider]  albuterol (PROVENTIL HFA;VENTOLIN HFA) 108 (90 Base) MCG/ACT inhaler Inhale 1 puff into the lungs every 6 (six) hours as needed for wheezing or shortness of breath. 07/28/15   Robyn Haber, MD  albuterol (PROVENTIL) (2.5 MG/3ML) 0.083% nebulizer solution Take 3 mLs (2.5 mg total) by nebulization every 6 (six) hours as needed for wheezing or shortness of breath. [id number]   Delora Fuel, MD  aspirin EC 81 MG tablet Take 1 tablet (81 mg total) by mouth daily. 12/20/15   Kathie Dike, MD  canagliflozin (INVOKANA) 300 MG TABS tablet Take 300 mg by mouth daily before breakfast.    [provider]  Choline Fenofibrate (FENOFIBRIC ACID) 135 MG CPDR Take 1 capsule by mouth daily. 02/13/19   [provider]  clonazePAM (KLONOPIN) 0.5 MG tablet Take 0.25 mg by mouth 2 (two) times daily as needed for anxiety.  11/13/18   [provider]    divalproex (DEPAKOTE ER) 250 MG 24 hr tablet Take 250 mg by mouth 2 (two) times daily. 12/31/18   [provider]  DULoxetine (CYMBALTA) 60 MG capsule Take 2 capsules (120 mg total) by mouth daily. Patient taking differently: Take 60 mg by mouth daily.  12/06/15   Wardell Honour, MD  fenofibrate (TRICOR) 145 MG tablet Take 1 tablet (145 mg total) by mouth daily. 04/13/16   Wardell Honour, MD  furosemide (LASIX) 20 MG tablet Take 20 mg by mouth. 02/19/19   [provider]  glucose blood (CHOICE DM FORA G20 TEST STRIPS) test strip Use as instructed 10/27/15   Wardell Honour, MD  ibuprofen (ADVIL,MOTRIN) 200 MG tablet Take 800 mg by mouth every 6 (six) hours as needed for fever. Reported on 07/28/2015    [provider]  Insulin Glargine (LANTUS SOLOSTAR) 100 UNIT/ML Solostar Pen Inject 20 Units into the skin daily at 10 pm. 12/20/15   Kathie Dike, MD  Insulin Pen Needle 32G X 4 MM MISC 1 application by Does not apply route daily. 01/04/16   Wardell Honour, MD  Lancets [facility] ULTRASOFT) lancets Use as instructed; check sugar once daily 10/27/15   Wardell Honour, MD  mometasone (NASONEX) 50 MCG/ACT nasal spray Place 2 sprays into the nose daily. 01/20/15   Robyn Haber, MD  naproxen (NAPROSYN) 500 MG tablet Take 1 tablet (500 mg total) by mouth 2 (two) times daily. 02/26/16   Nat Christen, MD  omeprazole (PRILOSEC) 40 MG capsule Take 1 capsule (40 mg total) by mouth daily. 10/27/15   Wardell Honour, MD  fluticasone Asencion Islam) 50 MCG/ACT nasal  spray Place 2 sprays into the nose daily. Patient not taking: Reported on 03/10/2014 03/08/12 03/10/14  Robyn Haber, MD     Vital Signs: BP (!) 159/78 (BP Location: Left Leg)    Pulse 62    Temp 98.4 F (36.9 C) (Axillary)    Resp (!) 25    Ht 5\' 4"  (1.626 m)    Wt 183 lb 10.3 oz (83.3 kg)    SpO2 97%    BMI 31.52 kg/m   Physical Exam Vitals signs reviewed.  Constitutional:      Comments: Not focused Communicating  little Does not answer questions directly  Musculoskeletal:     Comments: Agitated and moving in bed Moving right well Left leg slow to move to command Nothing on left arm    Psychiatric:     Comments: Agitated Flailing in bed Speech is incoherent     Imaging: Ct Angio Head W Or Wo Contrast  Result Date: 03/02/2019 CLINICAL DATA:  Stroke code.  Left-sided weakness EXAM: CT ANGIOGRAPHY HEAD AND NECK CT PERFUSION BRAIN TECHNIQUE: Multidetector CT imaging of the head and neck was performed using the standard protocol during bolus administration of intravenous contrast. Multiplanar CT image reconstructions and MIPs were obtained to evaluate the vascular anatomy. Carotid stenosis measurements (when applicable) are obtained utilizing NASCET criteria, using the distal internal carotid diameter as the denominator. Multiphase CT imaging of the brain was performed following IV bolus contrast injection. Subsequent parametric perfusion maps were calculated using RAPID software. CONTRAST:  143mL OMNIPAQUE IOHEXOL 350 MG/ML SOLN COMPARISON:  Noncontrast head CT performed earlier the same day. FINDINGS: CTA NECK FINDINGS Aortic arch: Standard branching. Right carotid system: CCA widely patent. Abnormal hypodensity within the origin of the right ICA and within the right carotid bulb, likely reflecting a combination of soft plaque and adherent thrombus with resultant high-grade stenosis. Distal to this, the right ICA is asymmetrically small in caliber within the neck, but patent. Left carotid system: CCA widely patent. Mild soft plaque at the carotid bifurcation and within the proximal ICA without significant stenosis of the proximal ICA (50% or greater). Distal to this, the left ICA is widely patent within the neck without stenosis. Vertebral arteries: Codominant. Streak artifact from a dense left-sided contrast bolus somewhat limits evaluation of the V1 left vertebral artery. Within this limitation, the  bilateral vertebral arteries are patent throughout the neck without significant stenosis (50% or greater). Skeleton: No acute bony abnormality. Congenital C3-C4 vertebral body fusion Other neck: No soft tissue neck mass or pathologically enlarged cervical chain lymph nodes. 1.7 cm nodule within the right thyroid lobe. Upper chest: No consolidation within the visualized lung apices. Review of the MIP images confirms the above findings CTA HEAD FINDINGS Anterior circulation: The right internal carotid artery is patent through the siphon region. There is occlusive or near occlusive thrombus within the right ICA terminus with occlusive/near occlusive thrombus extending into the M1 right middle cerebral artery and A1 right anterior cerebral artery. There is some reconstitution of flow within proximal M2 and more distal right MCA branches. However, there are additional high-grade stenoses within proximal to mid right M2 branches, likely reflecting additional thrombus Reconstitution of flow within the distal A1 right ACA, likely from cross flow via the anterior communicating artery. The A1 left ACA is patent without significant stenosis. The A2 and more distal anterior cerebral arteries are patent. The intracranial left internal carotid arteries patent without significant stenosis. The left middle cerebral artery is patent without significant proximal  stenosis. No intracranial aneurysm is identified. Posterior circulation: The intracranial vertebral arteries are patent without significant stenosis, as is the basilar artery. The bilateral posterior cerebral arteries are patent without significant proximal stenosis. A sizable right posterior communicating artery is present and patent, although the direction of flow within this vessel is uncertain. Venous sinuses: Within limitations of contrast timing, no convincing thrombus. Anatomic variants: As described. Review of the MIP images confirms the above findings CT Brain  Perfusion Findings: CBF (<30%) Volume: 93mL Perfusion (Tmax>6.0s) volume: 46mL Mismatch Volume: 63mL Infarction Location: Right MCA vascular territory IMPRESSION: CTA neck: 1. Prominent abnormal hypodensity within the origin of the right internal carotid artery and right carotid bulb, likely reflecting a combination of soft plaque and adherent thrombus. Resultant high-grade stenosis at this site. 2. Left common and internal carotid arteries patent within the neck without significant stenosis. 3. Bilateral vertebral arteries patent within the neck without significant stenosis. 4. 1.7 cm left thyroid lobe nodule. Nonemergent thyroid ultrasound recommended for further evaluation. CTA head: 1. Occlusive/near occlusive thrombus within the right ICA terminus, extending into the M1 right middle cerebral artery and A1 right anterior cerebral artery. There is some reconstitution of flow within proximal M2 and more distal right MCA branches. However, there are additional high-grade stenoses within proximal to mid right M2 branches, likely reflecting additional thrombus. Opacification of the distal A1 right ACA and more distal right ACA, likely from cross flow via the anterior communicating artery. 2. No other intracranial large vessel occlusion or proximal high-grade arterial stenosis. CT perfusion head: 5 mL core infarct within the right MCA vascular territory. 70 mL region of hypoperfused parenchyma within the right MCA vascular territory. Reported mismatch volume 65 mL. Electronically Signed   By: Kellie Simmering DO   On: 03/02/2019 09:31   Dg Abd 1 View  Result Date: 03/03/2019 CLINICAL DATA:  Nasogastric tube placement. EXAM: ABDOMEN - 1 VIEW COMPARISON:  One view abdomen 03/02/2019. FINDINGS: 1640 hours. Tip of the nasogastric tube is in the right upper quadrant of the abdomen, likely in the distal stomach or proximal duodenum. The visualized bowel gas pattern is normal. There are no suspicious abdominal calcifications  or acute osseous findings. IMPRESSION: Nasogastric tube tip in the distal stomach or proximal duodenum. Electronically Signed   By: Richardean Sale M.D.   On: 03/03/2019 16:54   Dg Abd 1 View  Result Date: 03/02/2019 CLINICAL DATA:  NG tube placement EXAM: ABDOMEN - 1 VIEW COMPARISON:  None. FINDINGS: Esophageal tube tip overlies the gastric body. Nonobstructed bowel-gas pattern IMPRESSION: Esophageal tube tip overlies the gastric body Electronically Signed   By: Donavan Foil M.D.   On: 03/02/2019 19:04   Ct Head Wo Contrast  Result Date: 03/02/2019 CLINICAL DATA:  Status post revascularization of the occluded right ICA terminus. EXAM: CT HEAD WITHOUT CONTRAST TECHNIQUE: Contiguous axial images were obtained from the base of the skull through the vertex without intravenous contrast. COMPARISON:  CT head and CT perfusion 03/02/2019 FINDINGS: Brain: There is contrast staining within the core infarct area of the right lentiform nucleus. There is also some contrast staining within the body of the right caudate lobe. No other focal areas of contrast enhancement is present. Gray-white differentiation is otherwise normal. Acute hemorrhage or mass lesion is present. There is some mass effect on the right lateral ventricle. Vascular: No hyperdense vessel or unexpected calcification.  The Skull: Calvarium is intact. No focal lytic or blastic lesions are present. Sinuses/Orbits: Mild mucosal thickening is present  inferior right maxillary sinus. Is scattered ethmoid opacification and some mucosal thickening in the inferior right frontal sinus. No fluid levels are present. Mastoid air cells are clear. Globes and orbits are within normal limits. IMPRESSION: 1. Contrast staining in the right lentiform nucleus and caudate following revascularization. This is consistent with areas infarction. 2. Remainder of right MCA territory is within normal limits. 3. No acute hemorrhage. 4. Mild sinus disease. Electronically Signed    By: San Morelle M.D.   On: 03/02/2019 14:08   Ct Angio Neck W Or Wo Contrast  Result Date: 03/02/2019 CLINICAL DATA:  Stroke code.  Left-sided weakness. EXAM: CT ANGIOGRAPHY HEAD AND NECK CT PERFUSION BRAIN TECHNIQUE: Multidetector CT imaging of the head and neck was performed using the standard protocol during bolus administration of intravenous contrast. Multiplanar CT image reconstructions and MIPs were obtained to evaluate the vascular anatomy. Carotid stenosis measurements (when applicable) are obtained utilizing NASCET criteria, using the distal internal carotid diameter as the denominator. Multiphase CT imaging of the brain was performed following IV bolus contrast injection. Subsequent parametric perfusion maps were calculated using RAPID software. CONTRAST:  Intravenous contrast given not known at this time COMPARISON:  Noncontrast head CT FINDINGS: CTA NECK FINDINGS Aortic arch: Standard branching. Right carotid system: CCA widely patent. Abnormal hypodensity within the origin of the right ICA and within the right carotid bulb, likely reflecting a combination of soft plaque and adherent thrombus with resultant high-grade stenosis. Distal to this, the right ICA is asymmetrically small in caliber within the neck, but patent. Left carotid system: CCA widely patent. Mild soft plaque at the carotid bifurcation and within the proximal ICA without significant stenosis of the proximal ICA (50% or greater). Distal to this, the left ICA is widely patent within the neck without stenosis. Vertebral arteries: Codominant. Streak artifact from a dense left-sided contrast bolus somewhat limits evaluation of the V1 left vertebral artery. Within this limitation, the bilateral vertebral arteries are patent throughout the neck without significant stenosis (50% or greater). Skeleton: No acute bony abnormality. Congenital C3-C4 vertebral body fusion Other neck: No soft tissue neck mass or pathologically enlarged  cervical chain lymph nodes. 1.7 cm nodule within the right thyroid lobe. Upper chest: No consolidation within the visualized lung apices. Review of the MIP images confirms the above findings CTA HEAD FINDINGS Anterior circulation: The right internal carotid artery is patent through the siphon region. There is occlusive or near occlusive thrombus within the right ICA terminus with occlusive/near occlusive thrombus extending into the M1 right middle cerebral artery and A1 right anterior cerebral artery. There is some reconstitution of flow within proximal M2 and more distal right MCA branches. However, there are additional high-grade stenoses within proximal to mid right M2 branches, likely reflecting additional thrombus Reconstitution of flow within the distal A1 right ACA, likely from cross flow via the anterior communicating artery. The A1 left ACA is patent without significant stenosis. The A2 and more distal anterior cerebral arteries are patent. The intracranial left internal carotid arteries patent without significant stenosis. The left middle cerebral artery is patent without significant proximal stenosis. No intracranial aneurysm is identified. Posterior circulation: The intracranial vertebral arteries are patent without significant stenosis, as is the basilar artery. The bilateral posterior cerebral arteries are patent without significant proximal stenosis. A sizable right posterior communicating artery is present and patent, although the direction of flow within this vessel is uncertain. Venous sinuses: Within limitations of contrast timing, no convincing thrombus. Anatomic variants: As described. Review  of the MIP images confirms the above findings CT Brain Perfusion Findings: CBF (<30%) Volume: 73mL Perfusion (Tmax>6.0s) volume: 60mL Mismatch Volume: 52mL Infarction Location:Right MCA vascular territory These results were called by telephone at the time of interpretation on 03/02/2019 at 8:46 am to provider  [facility] , who verbally acknowledged these results. IMPRESSION: CTA neck: 1. Prominent abnormal hypodensity within the origin of the right internal carotid artery and right carotid bulb, likely reflecting a combination of soft plaque and adherent thrombus. Resultant high-grade stenosis at this site. 2. Left common and internal carotid arteries patent within the neck without significant stenosis. 3. Bilateral vertebral arteries patent within the neck without significant stenosis. 4. 1.7 cm left thyroid lobe nodule. Nonemergent thyroid ultrasound recommended for further evaluation. CTA head: 1. Occlusive/near occlusive thrombus within the right ICA terminus, extending into the M1 right middle cerebral artery and A1 right anterior cerebral artery. There is some reconstitution of flow within proximal M2 and more distal right MCA branches. However, there are additional high-grade stenoses within proximal to mid right M2 branches, likely reflecting additional thrombus. Opacification of the distal A1 right ACA and more distal right ACA, likely from cross flow via the anterior communicating artery. 2. No other intracranial large vessel occlusion or proximal high-grade arterial stenosis. CT perfusion head: 5 mL core infarct within the right MCA vascular territory. 70 mL region of hypoperfused parenchyma within the right MCA vascular territory. Reported mismatch volume 65 mL. Electronically Signed   By: Kellie Simmering DO   On: 03/02/2019 09:08   Ct Cerebral Perfusion W Contrast  Result Date: 03/02/2019 CLINICAL DATA:  Stroke code.  Left-sided weakness EXAM: CT ANGIOGRAPHY HEAD AND NECK CT PERFUSION BRAIN TECHNIQUE: Multidetector CT imaging of the head and neck was performed using the standard protocol during bolus administration of intravenous contrast. Multiplanar CT image reconstructions and MIPs were obtained to evaluate the vascular anatomy. Carotid stenosis measurements (when applicable) are obtained  utilizing NASCET criteria, using the distal internal carotid diameter as the denominator. Multiphase CT imaging of the brain was performed following IV bolus contrast injection. Subsequent parametric perfusion maps were calculated using RAPID software. CONTRAST:  129mL OMNIPAQUE IOHEXOL 350 MG/ML SOLN COMPARISON:  Noncontrast head CT performed earlier the same day. FINDINGS: CTA NECK FINDINGS Aortic arch: Standard branching. Right carotid system: CCA widely patent. Abnormal hypodensity within the origin of the right ICA and within the right carotid bulb, likely reflecting a combination of soft plaque and adherent thrombus with resultant high-grade stenosis. Distal to this, the right ICA is asymmetrically small in caliber within the neck, but patent. Left carotid system: CCA widely patent. Mild soft plaque at the carotid bifurcation and within the proximal ICA without significant stenosis of the proximal ICA (50% or greater). Distal to this, the left ICA is widely patent within the neck without stenosis. Vertebral arteries: Codominant. Streak artifact from a dense left-sided contrast bolus somewhat limits evaluation of the V1 left vertebral artery. Within this limitation, the bilateral vertebral arteries are patent throughout the neck without significant stenosis (50% or greater). Skeleton: No acute bony abnormality. Congenital C3-C4 vertebral body fusion Other neck: No soft tissue neck mass or pathologically enlarged cervical chain lymph nodes. 1.7 cm nodule within the right thyroid lobe. Upper chest: No consolidation within the visualized lung apices. Review of the MIP images confirms the above findings CTA HEAD FINDINGS Anterior circulation: The right internal carotid artery is patent through the siphon region. There is occlusive or near occlusive thrombus within the  right ICA terminus with occlusive/near occlusive thrombus extending into the M1 right middle cerebral artery and A1 right anterior cerebral artery.  There is some reconstitution of flow within proximal M2 and more distal right MCA branches. However, there are additional high-grade stenoses within proximal to mid right M2 branches, likely reflecting additional thrombus Reconstitution of flow within the distal A1 right ACA, likely from cross flow via the anterior communicating artery. The A1 left ACA is patent without significant stenosis. The A2 and more distal anterior cerebral arteries are patent. The intracranial left internal carotid arteries patent without significant stenosis. The left middle cerebral artery is patent without significant proximal stenosis. No intracranial aneurysm is identified. Posterior circulation: The intracranial vertebral arteries are patent without significant stenosis, as is the basilar artery. The bilateral posterior cerebral arteries are patent without significant proximal stenosis. A sizable right posterior communicating artery is present and patent, although the direction of flow within this vessel is uncertain. Venous sinuses: Within limitations of contrast timing, no convincing thrombus. Anatomic variants: As described. Review of the MIP images confirms the above findings CT Brain Perfusion Findings: CBF (<30%) Volume: 4mL Perfusion (Tmax>6.0s) volume: 29mL Mismatch Volume: 32mL Infarction Location: Right MCA vascular territory IMPRESSION: CTA neck: 1. Prominent abnormal hypodensity within the origin of the right internal carotid artery and right carotid bulb, likely reflecting a combination of soft plaque and adherent thrombus. Resultant high-grade stenosis at this site. 2. Left common and internal carotid arteries patent within the neck without significant stenosis. 3. Bilateral vertebral arteries patent within the neck without significant stenosis. 4. 1.7 cm left thyroid lobe nodule. Nonemergent thyroid ultrasound recommended for further evaluation. CTA head: 1. Occlusive/near occlusive thrombus within the right ICA  terminus, extending into the M1 right middle cerebral artery and A1 right anterior cerebral artery. There is some reconstitution of flow within proximal M2 and more distal right MCA branches. However, there are additional high-grade stenoses within proximal to mid right M2 branches, likely reflecting additional thrombus. Opacification of the distal A1 right ACA and more distal right ACA, likely from cross flow via the anterior communicating artery. 2. No other intracranial large vessel occlusion or proximal high-grade arterial stenosis. CT perfusion head: 5 mL core infarct within the right MCA vascular territory. 70 mL region of hypoperfused parenchyma within the right MCA vascular territory. Reported mismatch volume 65 mL. Electronically Signed   By: Kellie Simmering DO   On: 03/02/2019 09:31   Ct Head Code Stroke Wo Contrast  Result Date: 03/02/2019 CLINICAL DATA:  Code stroke. Altered level of consciousness (LOC), unexplained. Last known normal 2300, left-sided weakness. EXAM: CT HEAD WITHOUT CONTRAST TECHNIQUE: Contiguous axial images were obtained from the base of the skull through the vertex without intravenous contrast. COMPARISON:  Brain MRI 12/19/2015, head CT 12/19/2015 FINDINGS: Brain: Moderate to severe motion degradation, significantly limiting evaluation. No acute intracranial hemorrhage or acute demarcated cortical infarct definitively identified. No intracranial mass identified. No midline shift. No extra-axial fluid collection identified. Vascular: No hyperdense vessel identified Skull: No calvarial fracture or suspicious osseous lesion identified. Sinuses/Orbits: Globes unremarkable within described limitations. Ethmoid sinus mucosal thickening. No significant mastoid effusion. ASPECTS [facility] Stroke Program Early CT Score) difficult to report with confidence given degree of motion degradation. These results were communicated to Dr. Leonel Ramsay At 8:36 amon 11/23/2020by text page via the [facility]  messaging system. IMPRESSION: 1. Moderate to severe motion degradation significantly limits evaluation. 2. No acute intracranial hemorrhage or demarcated cortical infarction definitively identified. Electronically Signed  By: Kellie Simmering DO   On: 03/02/2019 08:36    Labs:  CBC: Recent Labs    03/02/19 0823 03/02/19 0826 03/02/19 1251 03/02/19 1254 03/03/19 1453  WBC 16.9*  --  22.4*  --  20.0*  HGB 14.9 15.3* 14.2 13.6 12.4  HCT 44.6 45.0 41.4 40.0 37.3  PLT 235  --  289  --  222    COAGS: Recent Labs    03/02/19 0823  INR 1.0  APTT 27    BMP: Recent Labs    03/02/19 1255 03/03/19 0846 03/03/19 1453 03/04/19 0417  NA 137 142 141 140  K 4.0 3.2* 3.3* 3.6  CL 109 112* 113* 109  CO2 18* 21* 22 21*  GLUCOSE 255* 122* 141* 177*  BUN 6 7 8 10   CALCIUM 7.9* 8.4* 8.3* 8.7*  CREATININE 0.64 0.68 0.61 0.59  GFRNONAA >60 >60 >60 >60  GFRAA >60 >60 >60 >60    LIVER FUNCTION TESTS: Recent Labs    03/02/19 0823 03/03/19 0846  BILITOT 0.7 0.8  AST 8* 17  ALT 17 17  ALKPHOS 50 47  PROT 6.7 5.9*  ALBUMIN 3.6 3.1*    Assessment and Plan:  No real changes Will re attempt CT today Plan per Dr Leonie Man   Electronically Signed: Lavonia Drafts, PA-C 03/04/2019, 8:53 AM   I spent a total of 15 Minutes at the the patient's bedside AND on the patient's hospital floor or unit, greater than 50% of which was counseling/coordinating care for Rt ICA; MCA and ACA revascularization

## 2019-03-05 DIAGNOSIS — I6601 Occlusion and stenosis of right middle cerebral artery: Secondary | ICD-10-CM

## 2019-03-05 LAB — BASIC METABOLIC PANEL
Anion gap: 9 (ref 5–15)
BUN: 9 mg/dL (ref 6–20)
CO2: 19 mmol/L — ABNORMAL LOW (ref 22–32)
Calcium: 8.3 mg/dL — ABNORMAL LOW (ref 8.9–10.3)
Chloride: 110 mmol/L (ref 98–111)
Creatinine, Ser: 0.64 mg/dL (ref 0.44–1.00)
GFR calc Af Amer: >60 mL/min (ref >60)
GFR calc non Af Amer: >60 mL/min (ref >60)
Glucose, Bld: 116 mg/dL — ABNORMAL HIGH (ref 70–99)
Potassium: 3.4 mmol/L — ABNORMAL LOW (ref 3.5–5.1)
Sodium: 138 mmol/L (ref 135–145)

## 2019-03-05 LAB — URINE CULTURE
Culture: 50000 — AB
Special Requests: NORMAL

## 2019-03-05 LAB — GLUCOSE, CAPILLARY
Glucose-Capillary: 127 mg/dL — ABNORMAL HIGH (ref 70–99)
Glucose-Capillary: 139 mg/dL — ABNORMAL HIGH (ref 70–99)
Glucose-Capillary: 159 mg/dL — ABNORMAL HIGH (ref 70–99)
Glucose-Capillary: 184 mg/dL — ABNORMAL HIGH (ref 70–99)
Glucose-Capillary: 157 mg/dL — ABNORMAL HIGH (ref 70–99)
Glucose-Capillary: 145 mg/dL — ABNORMAL HIGH (ref 70–99)

## 2019-03-05 LAB — CBC
HCT: 35.5 % — ABNORMAL LOW (ref 36.0–46.0)
Hemoglobin: 11.8 g/dL — ABNORMAL LOW (ref 12.0–15.0)
MCH: 29.3 pg (ref 26.0–34.0)
MCHC: 33.2 g/dL (ref 30.0–36.0)
MCV: 88.1 fL (ref 80.0–100.0)
Platelets: 202 10*3/uL (ref 150–400)
RBC: 4.03 MIL/uL (ref 3.87–5.11)
RDW: 13.5 % (ref 11.5–15.5)
WBC: 15.4 10*3/uL — ABNORMAL HIGH (ref 4.0–10.5)
nRBC: 0 % (ref 0.0–0.2)

## 2019-03-05 MED ORDER — POTASSIUM CHLORIDE 20 MEQ PO PACK
20.0000 meq | PACK | Freq: Once | ORAL | Status: AC
  Administered 2019-03-05: 20 meq via ORAL
  Filled 2019-03-05: qty 1

## 2019-03-05 MED ORDER — GLUCERNA 1.2 CAL PO LIQD
1000.0000 mL | ORAL | Status: DC
  Administered 2019-03-05: 1000 mL
  Filled 2019-03-05: qty 1000

## 2019-03-05 MED ORDER — HYDROCODONE-ACETAMINOPHEN 10-325 MG PO TABS: 1.0000 | ORAL_TABLET | Freq: Three times a day (TID) | ORAL | Status: DC | PRN

## 2019-03-05 MED ORDER — MAGNESIUM CHLORIDE 64 MG PO TBEC
1.0000 | DELAYED_RELEASE_TABLET | Freq: Every day | ORAL | Status: AC
  Administered 2019-03-05: 64 mg via ORAL
  Filled 2019-03-05: qty 1

## 2019-03-05 MED ORDER — HYDROCODONE-ACETAMINOPHEN 5-325 MG PO TABS
1.0000 | ORAL_TABLET | Freq: Four times a day (QID) | ORAL | Status: DC | PRN
  Administered 2019-03-05 – 2019-03-09 (×7): 1 via ORAL
  Filled 2019-03-05 (×7): qty 1

## 2019-03-05 NOTE — Progress Notes (Signed)
NAME:  Ann Lopez, MRN:  [id number], DOB:  1970/11/25, LOS: 3 ADMISSION DATE:  03/02/2019, CONSULTATION DATE:  03/02/2019 REFERRING MD:  Roland Rack, MD CHIEF COMPLAINT:  Hypotension s/p R MCA embolectomy   Brief History   48 y/o female with PMH as below presented to ED 11/23 with left hemiparesis, left-sided neglect, and right gaze, NIHSS 12 --> IR MCA occlusion s/p embolectomy.  History of present illness   This 48 y/o female with PMH as below presented via EMS to the ED 11/23 am with left hemiparesis, left-sided neglect, and right gaze, NIHSS 12.  She was last known normal at 2300 11/22, awoke in am 11/23 and was able to ambulate to the bathroom but then could not stand from the toilet and fell to the ground.  She was taken to IR for emergent R MCA embolectomy.  When she arrived in 4N ICU, she was minimally responsive, exhibited seizure-like activity upon stimulation, and was hypotensive requiring pressor support.  Past Medical History  Anxiety/Depression, Asthma,Breast CA 2009 s/p bilat mastectomy Type 2 DM,HTN,HLD,Osteopenia,Gastric bypass 2013,GERD IBS Significant Hospital Events     Consults:  11/23 Neuro 11/23 PCCM  Procedures:  11/23 MCA embolectomy  Significant Diagnostic Tests:  11/23 CT Head w/o contrast> no hemorrhage 11/23 CTA/CTP >There is occlusive or near occlusive thrombus within the right ICA terminus with occlusive/near occlusive thrombus extending into the M1 right middle cerebral artery and A1 right anterior cerebral artery. There is some reconstitution of flow within proximal M2 and more distal right MCA branches. However, there are additional high-grade stenoses within proximal to mid right M2 branches, likely reflecting additional thrombus. 11/23 stat CT brain>>> occlusive thrombus within the right ICA terminus, extending to the M1 right middle cerebral artery and A1 right anterior cerebral artery 11/24 MRI brain>>> 11/23 EEG>>> consistent with  profound diffuse encephalopathic nonspecific Micro Data:  SARS-CoV-2 negative  Antimicrobials:     Interim history/subjective:  Awake and agitated. Wants NG tube out Tolerating Dysphagia II diet, and thin liquids  Objective   Blood pressure (!) 145/57, pulse 66, temperature 99.3 F (37.4 C), temperature source Oral, resp. rate 18, height 5\' 4"  (1.626 m), weight 87.1 kg, SpO2 99 %.        Intake/Output Summary (Last 24 hours) at 03/05/2019 [id number] Last data filed at 03/05/2019 0700 Gross per 24 hour  Intake 811.99 ml  Output 2125 ml  Net -1313.01 ml   Filed Weights   03/02/19 0905 03/04/19 2022 03/05/19 0500  Weight: 83.3 kg 86.1 kg 87.1 kg    Examination: General: Well-nourished well-developed female, right facial droop HEENT: MM pink/moist, pupils equal reactive light, extraocular movement is unremarkable Neuro: Follows commands moves all extremities CV: S1, S2, RRR No RMG PULM: Bilateral chest excursion, clear throughout, diminished per bases, sats are 99% on RA GI: soft, NT, ND,  bsx4 active  Extremities: warm/dry,  edema , no obvious deformities, brisk cap refill Skin: no rashes or lesions, skinf warm and dry   Resolved Hospital Problem list     Assessment & Plan:  Acute metabolic enecphalopathy after CVA - right M1 occlusion. Now s/p thrombectomy 11/23 R/o seizure Seizure-like activity with stimulation->appears more like myoclonus>> resolved Remains confused  Plan Per neurology EEG>> see above Echocardiogram (per the stroke team) Systolic blood pressure goal 120-140 per interventional radiology Floor with rehab per neuro   Hypotension. Resolved Plan Off pressors>>  systolic blood pressure goal of 120-140    Non-anion gap Metabolic acidosis >> resolved Hypokalemia  Hypomag Plan Lactate cleared 11/24 Trend chemistries Replete electrolytes as needed  HLD Plan Continue home TriCor  Type 2 DM w/ hyperglycemia A1C 9.2 Plan Trend CBG  Sliding-scale insulin protocol Resume home nocturnal of Lantus 20 units nightly will decrease to 10 units at this time and increase as needed.  GERD Plan Continue home Prilosec  Hx anxiety/depression Plan Continue home Prozac  Best practice:  Diet:  Pain/Anxiety/Delirium protocol (if indicated): NA VAP protocol (if indicated): NA  DVT prophylaxis: SCD GI prophylaxis: PPI Glucose control: ssi Mobility: BR Code Status: full code  Family Communication: 03/03/2019 patient updated at bedside Disposition:  ICU   Pt is still receiving  Tube feeds per large bore NG tube. She has passed her swallow eval and is tolerating a Dysphagia II diet I will DC NG and tube feeds, as th NG tube is making it difficult for her to eat. If she is unable to eat adequate amounts po we will then place Cor Track and resume TF if needed.  She is protecting her airway without difficulty .  Labs   CBC: Recent Labs  Lab 03/02/19 0823 03/02/19 0826 03/02/19 1251 03/02/19 1254 03/03/19 1453 03/05/19 0343  WBC 16.9*  --  22.4*  --  20.0* 15.4*  NEUTROABS 14.0*  --   --   --  15.4*  --   HGB 14.9 15.3* 14.2 13.6 12.4 11.8*  HCT 44.6 45.0 41.4 40.0 37.3 35.5*  MCV 89.0  --  87.3  --  89.4 88.1  PLT 235  --  289  --  222 [id number]    Basic Metabolic Panel: Recent Labs  Lab 03/02/19 1255 03/03/19 0846 03/03/19 1453 03/04/19 0417 03/04/19 1612 03/05/19 0343  NA 137 142 141 140  --  138  K 4.0 3.2* 3.3* 3.6  --  3.4*  CL 109 112* 113* 109  --  110  CO2 18* 21* 22 21*  --  19*  GLUCOSE 255* 122* 141* 177*  --  116*  BUN 6 7 8 10   --  9  CREATININE 0.64 0.68 0.61 0.59  --  0.64  CALCIUM 7.9* 8.4* 8.3* 8.7*  --  8.3*  MG  --   --  1.6* 1.7 1.6*  --   PHOS  --   --  2.8 2.9 2.9  --    GFR: Estimated Creatinine Clearance: 93.9 mL/min (by C-G formula based on SCr of 0.64 mg/dL). Recent Labs  Lab 03/02/19 0823 03/02/19 1251 03/02/19 1432 03/03/19 0846 03/03/19 1453 03/05/19 0343  WBC 16.9*  22.4*  --   --  20.0* 15.4*  LATICACIDVEN  --   --  3.4* 1.8  --   --     Liver Function Tests: Recent Labs  Lab 03/02/19 0823 03/03/19 0846  AST 8* 17  ALT 17 17  ALKPHOS 50 47  BILITOT 0.7 0.8  PROT 6.7 5.9*  ALBUMIN 3.6 3.1*   No results for input(s): LIPASE, AMYLASE in the last 168 hours. No results for input(s): AMMONIA in the last 168 hours.  ABG    Component Value Date/Time   PHART 7.282 (L) 03/02/2019 1254   PCO2ART 39.9 03/02/2019 1254   PO2ART 216.0 (H) 03/02/2019 1254   HCO3 18.9 (L) 03/02/2019 1254   TCO2 20 (L) 03/02/2019 1254   ACIDBASEDEF 8.0 (H) 03/02/2019 1254   O2SAT 100.0 03/02/2019 1254     Coagulation Profile: Recent Labs  Lab 03/02/19 0823  INR 1.0    Cardiac Enzymes:  Recent Labs  Lab 03/02/19 1255  CKTOTAL 65  CKMB 1.8    HbA1C: Hgb A1c MFr Bld  Date/Time Value Ref Range Status  03/03/2019 02:46 PM 9.0 (H) 4.8 - 5.6 % Final    Comment:    (NOTE) Pre diabetes:          5.7%-6.4% Diabetes:              >6.4% Glycemic control for   <7.0% adults with diabetes   03/02/2019 08:23 AM 9.2 (H) 4.8 - 5.6 % Final    Comment:    (NOTE) Pre diabetes:          5.7%-6.4% Diabetes:              >6.4% Glycemic control for   <7.0% adults with diabetes     CBG: Recent Labs  Lab 03/04/19 1524 03/04/19 1935 03/04/19 2300 03/05/19 0330 03/05/19 0757  GLUCAP 211* 228* 121* 127* 139*      Critical care time: 30 min     Magdalen Spatz, MSN, AGACNP-BC [place] [facility] Pager # [phone] After 4 pm please call [phone] 03/05/2019, 9:37 AM

## 2019-03-05 NOTE — Progress Notes (Addendum)
  Speech Language Pathology Treatment: Dysphagia  Patient Details Name: Ann Lopez MRN: [id number] DOB: 02/24/1971 Today's Date: 03/05/2019 Time: [id number] SLP Time Calculation (min) (ACUTE ONLY): 22 min  Assessment / Plan / Recommendation Clinical Impression  Pt today is fully alert = and frustrated with current lines - feeding tube, restraints.  She demonstrates decreased sustained attention = easily diverted by her own needs, desires to go home.  SLP observed her with intake of juice and yogurt - No indication of aspiration with all po observed.  Pt is impulsive and takes large sequential boluses thus increasing her aspiration risk.  IN addition, decreased sustained attention may impact her intake.  Pt responds to total cueing to eat/drink.  Surprisingly pt is able to swallow against her NG tube - Given voice is very strong and ability to swallow with tube, improvement will be present with tube removed.    Pt is mildly dysarthric and very impulsive.  She will require full supervision with po to diminish aspiration risk.  Cog evaluation pending, will be helpful to establish baseline given possibly some h/o cognitive deficits per chart review.    HPI HPI:  Ann Lopez is a 48 y.o. female with a history of diabetes, breast cancer who presents with left-sided weakness that started abruptly this morning.  Patient with history of acute CVA secondary to right ICA terminus occlusion, right MCA occlusion, and right ACA occlusion.  No previous dysphagia hx documented.       SLP Plan  Continue with current plan of care       Recommendations  Diet recommendations: Dysphagia 3 (mechanical soft);Thin liquid Liquids provided via: Cup;Straw Medication Administration: Whole meds with puree Supervision: Patient able to self feed;Full supervision/cueing for compensatory strategies;Staff to assist with self feeding Compensations: Minimize environmental distractions;Slow rate;Small  sips/bites;Lingual sweep for clearance of pocketing Postural Changes and/or Swallow Maneuvers: Seated upright 90 degrees                Oral Care Recommendations: Oral care BID Follow up Recommendations: Inpatient Rehab SLP Visit Diagnosis: Dysphagia, unspecified (R13.10) Plan: Continue with current plan of care       GO                Macario Golds 03/05/2019, 9:58 AM  Luanna Salk, MS [facility] SLP Acute Rehab Services Pager [phone] Office [phone]

## 2019-03-05 NOTE — Progress Notes (Signed)
STROKE TEAM PROGRESS NOTE   INTERVAL HISTORY Patient patient had CT scan of the brain done yesterday which confirmed right basal ganglia subinsular infarct.  She was transferred to the neurology floor bed yesterday.  She is awake alert and interactive today.  She is currently getting bedside swallow eval by speech therapy and will likely pass and NG tube can come out.  Vitals:   03/05/19 0329 03/05/19 0500 03/05/19 0723 03/05/19 1113  BP: (!) 114/94  (!) 145/57 (!) 151/65  Pulse: 81  66 (!) 56  Resp: 19  18 18   Temp: 97.7 F (36.5 C)  99.3 F (37.4 C) 97.7 F (36.5 C)  TempSrc: Oral  Oral Oral  SpO2: 99%   100%  Weight:  87.1 kg    Height:        CBC:  Recent Labs  Lab 03/02/19 0823  03/03/19 1453 03/05/19 0343  WBC 16.9*   < > 20.0* 15.4*  NEUTROABS 14.0*  --  15.4*  --   HGB 14.9   < > 12.4 11.8*  HCT 44.6   < > 37.3 35.5*  MCV 89.0   < > 89.4 88.1  PLT 235   < > 222 202   < > = values in this interval not displayed.    Basic Metabolic Panel:  Recent Labs  Lab 03/04/19 0417 03/04/19 1612 03/05/19 0343  NA 140  --  138  K 3.6  --  3.4*  CL 109  --  110  CO2 21*  --  19*  GLUCOSE 177*  --  116*  BUN 10  --  9  CREATININE 0.59  --  0.64  CALCIUM 8.7*  --  8.3*  MG 1.7 1.6*  --   PHOS 2.9 2.9  --    Lipid Panel:     Component Value Date/Time   CHOL 166 03/03/2019 0446   TRIG 515 (H) 03/03/2019 0446   HDL 23 (L) 03/03/2019 0446   CHOLHDL 7.2 03/03/2019 0446   VLDL UNABLE TO CALCULATE IF TRIGLYCERIDE OVER 400 mg/dL 03/03/2019 0446   LDLCALC UNABLE TO CALCULATE IF TRIGLYCERIDE OVER 400 mg/dL 03/03/2019 0446   HgbA1c:  Lab Results  Component Value Date   HGBA1C 9.0 (H) 03/03/2019   Urine Drug Screen:     Component Value Date/Time   LABOPIA POSITIVE (A) 03/02/2019 1240   COCAINSCRNUR NONE DETECTED 03/02/2019 1240   LABBENZ NONE DETECTED 03/02/2019 1240   AMPHETMU POSITIVE (A) 03/02/2019 1240   THCU NONE DETECTED 03/02/2019 1240   LABBARB NONE  DETECTED 03/02/2019 1240    Alcohol Level     Component Value Date/Time   ETH <10 03/02/2019 0823    IMAGING Dg Abd 1 View  Result Date: 03/03/2019 CLINICAL DATA:  Nasogastric tube placement. EXAM: ABDOMEN - 1 VIEW COMPARISON:  One view abdomen 03/02/2019. FINDINGS: 1640 hours. Tip of the nasogastric tube is in the right upper quadrant of the abdomen, likely in the distal stomach or proximal duodenum. The visualized bowel gas pattern is normal. There are no suspicious abdominal calcifications or acute osseous findings. IMPRESSION: Nasogastric tube tip in the distal stomach or proximal duodenum. Electronically Signed   By: Richardean Sale M.D.   On: 03/03/2019 16:54   Ct Head Wo Contrast  Result Date: 03/04/2019 CLINICAL DATA:  Stroke, follow-up. EXAM: CT HEAD WITHOUT CONTRAST TECHNIQUE: Contiguous axial images were obtained from the base of the skull through the vertex without intravenous contrast. COMPARISON:  CT head without contrast 03/02/2019  CTA head and neck 03/02/2019 FINDINGS: Brain: There is residual hyperdensity within the infarcted right lentiform nucleus. This likely represents resolving confluent petechial hemorrhage surrounding edema is again noted. There is partial effacement of the right lateral ventricle without midline shift. No new cortical infarct is present. Left hemisphere is unremarkable. Brainstem and cerebellum are normal. Vascular: No hyperdense vessel or unexpected calcification. Skull: Calvarium is intact. No focal lytic or blastic lesions are present. Sinuses/Orbits: Ethmoid sinus opacification is stable. No new sinus disease is present. The globes and orbits are within normal limits. IMPRESSION: 1. Residual hyperdensity within the infarcted right lentiform nucleus compatible with resolving confluent petechial hemorrhage, PH2. 2. No new cortical infarct. 3. Persistent partial effacement of the right lateral ventricle without midline shift. Electronically Signed   By:  San Morelle M.D.   On: 03/04/2019 15:36   Cerebral Angio  01/30/2019 S/P rt COMMON CAROTID ARTERIOGRAM,FOLLOWED BY COMPLETE REVASCULAR IZATION OF OCCLUDED  RT ICA TERMINUS,RT MCA AND RT ACA WITH X PASS WITH 4MMX 40 MM SOLITAIRE X fr RETRIEVER DEVICE AND PENUMBRA ASPIRATION ACHIEVING A tici 2c REVASCULARIZATION . S/P STENT ASSISTED ANGIOPLASTY OF SYMPTOTMATIC PROX RT ICA STENOSIS.Marland Kitchen  EEG 03/02/2019 This study is suggestive of profound diffuse encephalopathy, non specific to etiology but could be secondary to sedation. No seizures or epileptiform discharges were seen throughout the recording.  2D Echocardiogram  1. Left ventricular ejection fraction, by visual estimation, is 60 to 65%. The left ventricle has normal function. There is no left ventricular hypertrophy.  2. The left ventricle has no regional wall motion abnormalities.  3. Global right ventricle has normal systolic function.The right ventricular size is normal. No increase in right ventricular wall thickness.  4. Left atrial size was normal.  5. Right atrial size was normal.  6. The mitral valve is normal in structure. No evidence of mitral valve regurgitation. No evidence of mitral stenosis.  7. The tricuspid valve is normal in structure. Tricuspid valve regurgitation is not demonstrated.  8. The aortic valve is normal in structure. Aortic valve regurgitation is not visualized. No evidence of aortic valve sclerosis or stenosis.  9. The pulmonic valve was normal in structure. Pulmonic valve regurgitation is not visualized. 10. Normal pulmonary artery systolic pressure. 11. The inferior vena cava is dilated in size with >50% respiratory variability, suggesting right atrial pressure of 8 mmHg.  PHYSICAL EXAM   Middle-aged Caucasian lady is not in distress. Afebrile. Head is nontraumatic. Neck is supple without bruit.    Cardiac exam no murmur or gallop. Lungs are clear to auscultation. Distal pulses are well  felt. Neurological Exam :  Patient is awake alert and interactive.  Speech is normal.  There is no dysarthria or aphasia.  She has right gaze preference and can look partially to the left.  She has dense left homonymous hemianopsia.  She has dysarthria but can be easily understood.  She follows commands well.  There is left lower facial weakness.  There is left hemineglect.  There is left dense hemiplegia with 1/5 left upper extremity and 2/5 left lower extremity strength.  She has normal strength on the right.  Sensation is diminished on the left.  Tone is diminished on the left normal on the right.  Plantars left is upgoing right is downgoing. ASSESSMENT/PLAN [name] is a 48 y.o. female with history of DB, HLD and breast cancer who woke with L sided weakness, R gaze and neuro neglect. CT showed a large penumbra w/ small core  and R M1 occlusion taken for mechanical thrombectomy.   Stroke:   R MCA infarct s/p IR w/ R ICA stent placement in setting of large vessel disease  Code Stroke CT head No acute abnormality.  CTA head R ICA terminus near occlusion extending into R M1 and R A1. High grade stenoses R M2 branches.  CTA neck R ICA origin/carotid bulb hypodensity w/ high grade stenosis. 1.7cm L thyroid nodule.  CT perfusion R MCA 50mL core infarct w/ 6mL penumbra. Mismatch vol 65 mL.  Cerebral Angio R ICA, R MCA and R AVA occlusion w/ TICI2C revascularization w/ solitaire and penumbra. Stent angioplasty R ICA stenosis    Post IR CT contrast blush in the caudate and putamen nuclei. NO gross mass effect or ICH seen.  MRI  (clausophobia, given Haldol and ativan on call). cancel MRI   Repeat CT head to confirm stroke w/ sedation plan for later today  2D Echo EF 60-65%. No source of embolus   EEG neg sz  LDL UTC d/t TG 515. direct LDL pending   HgbA1c 9.2  SCDs for VTE prophylaxis  aspirin 81 mg daily prior to admission, now on aspirin 81 mg daily and Brilinta (ticagrelor) 90  mg bid. Continue DAPT.  Therapy recommendations:  CIR. Consult placed.    Disposition:  pending   Transfer to the floor  Hypotension, resolved  Home meds:  On lasix, no hx HTN Off neo BP goal < 180 BP 150s  (BP cuff on leg d/t double mastectomies)  Hyperlipidemia  Home meds:  tricor 145  LDL UTC d/t high TG 515, LDL goal < 70. TC 166   Check direct LDL pending   Consider statin based on LDL results  Diabetes type II Uncontrolled  Home meds:  lantus 20    Now on lantus 10  HgbA1c 9.2, goal < 7.0  CBGs  SSI  Increase lantus. Continue towards home dose if po intake good today  Dysphagia . Secondary to stroke . Speech on board . Cleared for D2 thin . D/c NG placed yesterday   Other Stroke Risk Factors  Cigarette smoker, advised to stop smoking  Substance abuse - pt denies use of speed, is on narcs for chronic pain - UDS positive for amphetamines and opiates   Obesity, Body mass index is 32.96 kg/m., recommend weight loss, diet and exercise as appropriate   Migraine. Seen by DDr. Krista Blue in 11/2014 and put on topamax 50 bid and maxalt prn  Other Active Problems  Breast cancer 2009 s/p surgical  Post chemo memory deficit. Seen by Dr. Tomi Likens who recommended neuro psych testing. No f/u since 08/2015    1.7cm L thyroid nodule - non emergent [place] recommended for f/u  Anxiety/depression. On Cymbalta  Lactic acid 3.4. UCx w/ 50K ecoli. No tx at this time.  Hypokalemia, resolved 4.0->3.2->3.3->3.6   Patient agitated, constantly moving. Per family, behavior is abnormal at baseline. Continue to monitor.   Leukocytosis WBC 22.4->20.0  Hospital day # 3 Plan discontinue NG tube and start on dysphagia 3 diet as per bedside swallow eval by speech therapy.  Mobilize out of bed.  Continue physical occupational therapy.  Inpatient rehab consult..  Continue aggressive risk factor modification.  Greater than 50% time during this 25-minute visit was spent in counseling and  coordination of care and discussion with care team and answering questions.  Antony Contras, MD Medical Director [facility] Pager: [phone] 03/05/2019 11:39 AM   To contact Stroke Continuity  provider, please refer to [email or web address] After hours, contact General Neurology

## 2019-03-05 NOTE — Progress Notes (Addendum)
RN has went into patients room several time because patient is screaming "help me" at the top of her lungs.  RN goes in and patient asks for a sip of drink and went I put the cup to her mouth she will not drink it.  We have done this multiple times.  Several other staff members have went in to check on patient to find patient safe in bed screaming "help me!"  RN stood at door, unobserved, and assessed patient for over 10 mins and patients vitals were normal; pt has purewick in place, patient safely restrained in bed, food/drink has been offered.

## 2019-03-05 NOTE — Progress Notes (Signed)
PCCM to [facility] transfer:  Patient with h/o HLD; FM; and breast cancer who presented on 11/23 with left-sided weakness, found to have a right MCA occlusion who had a mechanical thrombectomy.  She is admitted to the Stroke Team but PCCM has been consulting and they would request that we assume consultative services.  She has been improving from the intervention, is awake, alert, moving everything, and following commands.  She will need medical management for non-stroke needs (DM, GERD, depression/anxiety).  TRH will consult as of 11/27.   Carlyon Shadow, M.D.

## 2019-03-06 ENCOUNTER — Encounter (HOSPITAL_COMMUNITY): Payer: Self-pay | Admitting: Interventional Radiology

## 2019-03-06 DIAGNOSIS — Z853 Personal history of malignant neoplasm of breast: Secondary | ICD-10-CM

## 2019-03-06 DIAGNOSIS — E876 Hypokalemia: Secondary | ICD-10-CM

## 2019-03-06 LAB — COMPREHENSIVE METABOLIC PANEL
ALT: 36 U/L (ref 0–44)
AST: 28 U/L (ref 15–41)
Albumin: 2.7 g/dL — ABNORMAL LOW (ref 3.5–5.0)
Alkaline Phosphatase: 87 U/L (ref 38–126)
Anion gap: 8 (ref 5–15)
BUN: 6 mg/dL (ref 6–20)
CO2: 23 mmol/L (ref 22–32)
Calcium: 8.3 mg/dL — ABNORMAL LOW (ref 8.9–10.3)
Chloride: 109 mmol/L (ref 98–111)
Creatinine, Ser: 0.49 mg/dL (ref 0.44–1.00)
GFR calc Af Amer: >60 mL/min (ref >60)
GFR calc non Af Amer: >60 mL/min (ref >60)
Glucose, Bld: 97 mg/dL (ref 70–99)
Potassium: 3.8 mmol/L (ref 3.5–5.1)
Sodium: 140 mmol/L (ref 135–145)
Total Bilirubin: 1.5 mg/dL — ABNORMAL HIGH (ref 0.3–1.2)
Total Protein: 5.7 g/dL — ABNORMAL LOW (ref 6.5–8.1)

## 2019-03-06 LAB — CBC
HCT: 34.4 % — ABNORMAL LOW (ref 36.0–46.0)
Hemoglobin: 11.5 g/dL — ABNORMAL LOW (ref 12.0–15.0)
MCH: 29.6 pg (ref 26.0–34.0)
MCHC: 33.4 g/dL (ref 30.0–36.0)
MCV: 88.7 fL (ref 80.0–100.0)
Platelets: 216 10*3/uL (ref 150–400)
RBC: 3.88 MIL/uL (ref 3.87–5.11)
RDW: 13.5 % (ref 11.5–15.5)
WBC: 12.6 10*3/uL — ABNORMAL HIGH (ref 4.0–10.5)
nRBC: 0 % (ref 0.0–0.2)

## 2019-03-06 LAB — GLUCOSE, CAPILLARY
Glucose-Capillary: 102 mg/dL — ABNORMAL HIGH (ref 70–99)
Glucose-Capillary: 103 mg/dL — ABNORMAL HIGH (ref 70–99)
Glucose-Capillary: 112 mg/dL — ABNORMAL HIGH (ref 70–99)
Glucose-Capillary: 118 mg/dL — ABNORMAL HIGH (ref 70–99)
Glucose-Capillary: 155 mg/dL — ABNORMAL HIGH (ref 70–99)
Glucose-Capillary: 158 mg/dL — ABNORMAL HIGH (ref 70–99)
Glucose-Capillary: 88 mg/dL (ref 70–99)

## 2019-03-06 LAB — MAGNESIUM: Magnesium: 1.7 mg/dL (ref 1.7–2.4)

## 2019-03-06 NOTE — Evaluation (Signed)
Speech Language Pathology Evaluation Patient Details Name: Ann Lopez MRN: [id number] DOB: 24-Oct-1970 Today's Date: 03/06/2019 Time: 5784-6962 SLP Time Calculation (min) (ACUTE ONLY): 11 min  Problem List:  Patient Active Problem List   Diagnosis Date Noted  . Tachypnea   . SIRS (systemic inflammatory response syndrome) (HCC)   . Hypokalemia   . Diabetes mellitus type 2 in obese ([place])   . Tobacco abuse   . History of breast cancer   . Cognitive deficits   . Dysphagia, post-stroke   . Stroke (cerebrum) ([place]) 03/02/2019  . Middle cerebral artery embolism, right 03/02/2019  . Chest pain 12/19/2015  . Numbness 12/19/2015  . Femoral hernia 03/24/2014  . Dyslipidemia 09/10/2011  . BRCA1 positive 09/10/2011  . Breast cancer ([place]) 09/10/2011  . H/O gastric bypass; 05/14/11([facility]) 09/10/2011  . Essential hypertension 10/28/2008  . DEPRESSION/ANXIETY 02/19/2007  . CARPAL TUNNEL SYNDROME, RIGHT 02/19/2007  . ALLERGIC RHINITIS 02/19/2007  . GERD 02/19/2007  . IRRITABLE BOWEL SYNDROME 02/19/2007  . HERPES GENITALIS 12/30/2006  . Diabetes ([place]) 12/30/2006  . OSTEOPENIA 12/30/2006  . ANOMALY, CONGENITAL, NERVOUS SYSTEM NEC 12/30/2006   Past Medical History:  Past Medical History:  Diagnosis Date  . Allergy   . Anxiety   . Asthma   . Back pain   . Breast cancer ([place])   . Cancer ([place])   . Depression   . Diabetes mellitus   . Headache   . Hyperlipemia   . Osteopenia   . Osteoporosis    osteopenia   Past Surgical History:  Past Surgical History:  Procedure Laterality Date  . ABDOMINAL HYSTERECTOMY     B oophorectomy for BRCA1 gene  . BREAST SURGERY    . CESAREAN SECTION    . INCISION AND DRAINAGE ABSCESS Left 11/14/2012   Procedure: INCISION AND DRAINAGE ABSCESS LEFT GROIN;  Surgeon: Jamesetta So, MD;  Location: [facility];  Service: General;  Laterality: Left;  . IR ANGIO VERTEBRAL SEL SUBCLAVIAN INNOMINATE UNI R MOD SED  03/02/2019  . IR CT HEAD LTD  03/02/2019  . IR  INTRAVSC STENT CERV CAROTID W/O EMB-PROT MOD SED INC ANGIO  03/02/2019  . IR PERCUTANEOUS ART THROMBECTOMY/INFUSION INTRACRANIAL INC DIAG ANGIO  03/02/2019  . LYMPHADENECTOMY    . MASTECTOMY Bilateral   . RADIOLOGY WITH ANESTHESIA N/A 03/02/2019   Procedure: IR WITH ANESTHESIA;  Surgeon: Luanne Bras, MD;  Location: [facility];  Service: Radiology;  Laterality: N/A;   HPI:   Ann Lopez is a 48 y.o. female with a history of diabetes, breast cancer who presents with left-sided weakness that started abruptly this morning.  Patient with history of acute CVA secondary to right ICA terminus occlusion, right MCA occlusion, and right ACA occlusion.  No previous dysphagia hx documented.    Assessment / Plan / Recommendation Clinical Impression  Pt was received alert and in recliner. SLP attempted formal cognitive evaluation but unable to administer given pt's fluctuating participation. throughout the informal evaluation, it was difficult to differentiate between delayed processing and poor task tolerance. Pt frequently answered questions after more than a reasonable amount of time and she also frequently didn't answer questions at all. Generally it appeared that pt answered more questions about her family/sons with a delay but didn't answer questions related to her level of education and current employment. She also didn't answer any formal questions (such as those targeted in [facility]). Pt was able to state that her physical deficits were preventing her from walking, sitting edge of  bed and from "going home." When asked about more therapy within Inpatient setting she responded "It is what it is." Her 57 year old son currently resides with pt and pt is unsure if her family will be able to help her. She never responded when asked who her 26 year old son currently stay with. ST to continue to follow for formal cognitive assessment.     SLP Assessment  SLP Recommendation/Assessment: Patient needs continued  Speech Lanaguage Pathology Services SLP Visit Diagnosis: Cognitive communication deficit (R41.841)    Follow Up Recommendations  Inpatient Rehab    Frequency and Duration min 2x/week  2 weeks      SLP Evaluation Cognition  Overall Cognitive Status: Impaired/Different from baseline Arousal/Alertness: Awake/alert Orientation Level: Oriented X4 Attention: Sustained Sustained Attention: Impaired Sustained Attention Impairment: Verbal basic;Functional basic Memory: (difficult assess) Awareness: Impaired Awareness Impairment: Intellectual impairment;Emergent impairment Problem Solving: Impaired Problem Solving Impairment: Verbal basic;Functional basic Behaviors: Restless;Impulsive;Poor frustration tolerance Safety/Judgment: Impaired       Comprehension  Auditory Comprehension Overall Auditory Comprehension: (difficult to assess)    Expression Expression Primary Mode of Expression: Verbal Verbal Expression Overall Verbal Expression: Appears within functional limits for tasks assessed Initiation: Impaired Pragmatics: Impairment Impairments: Abnormal affect;Eye contact;Turn Taking Interfering Components: Attention Effective Techniques: Open ended questions Non-Verbal Means of Communication: Not applicable Written Expression Dominant Hand: Right Written Expression: Not tested   Oral / Motor  Motor Speech Overall Motor Speech: Appears within functional limits for tasks assessed Respiration: Within functional limits Phonation: Normal Resonance: Within functional limits Articulation: Within functional limitis Intelligibility: Intelligible Motor Planning: Witnin functional limits Motor Speech Errors: Not applicable   GO                    Ann Lopez 03/06/2019, 2:15 PM

## 2019-03-06 NOTE — Progress Notes (Signed)
Patient refusing to take Seroquel. MD paged.  Gwendolyn Grant, RN

## 2019-03-06 NOTE — Progress Notes (Signed)
PROGRESS NOTE  Ann Lopez [id number] DOB: July 29, 1970 DOA: 03/02/2019 PCP: Hayden Rasmussen, MD  Brief History   This 48 y/o female with PMH as below presented via EMS to the ED 11/23 am with left hemiparesis, left-sided neglect, and right gaze, NIHSS 12.  She was last known normal at 2300 11/22, awoke in am 11/23 and was able to ambulate to the bathroom but then could not stand from the toilet and fell to the ground.  She was taken to IR for emergent R MCA embolectomy.  When she arrived in 4[facility] ICU, she was minimally responsive, exhibited seizure-like activity upon stimulation, and was hypotensive requiring pressor support.  11/23 CT Head w/o contrast> no hemorrhage 11/23 CTA/CTP >There is occlusive or near occlusive thrombus within the right ICA terminus with occlusive/near occlusive thrombus extending into the M1 right middle cerebral artery and A1 right anterior cerebral artery. There is some reconstitution of flow within proximal M2 and more distal right MCA branches. However, there are additional high-grade stenoses within proximal to mid right M2 branches, likely reflecting additional thrombus. 11/23 stat CT brain>>> occlusive thrombus within the right ICA terminus, extending to the M1 right middle cerebral artery and A1 right anterior cerebral artery 11/24 MRI brain>>> 11/23 EEG>>> consistent with profound diffuse encephalopathic nonspecific  Consultants  Hospitalists . PCCM  Procedures  Emergent Thrombectomy achieving a TICI 2C revascularization, along with stent assisted angioplasty of symptomatic proximal right ICA stenosis via right femoral approach 03/02/2019 by Dr. Estanislado Pandy.  Antibiotics   Anti-infectives (From admission, onward)   Start     Dose/Rate Route Frequency Ordered Stop   03/02/19 0853  vancomycin (VANCOCIN) 1-5 GM/200ML-% IVPB    Note to Pharmacy: [name]   : cabinet override      03/02/19 0853 03/02/19 2059    .  Subjective  The patient is  resting comfortably. No new complaints.   Objective   Vitals:  Vitals:   03/06/19 1200 03/06/19 1653  BP: (!) 123/56 103/71  Pulse: (!) 58 61  Resp: 18 17  Temp: 98.7 F (37.1 C) 98.1 F (36.7 C)  SpO2: 98% 96%   Exam:  Constitutional:  . The patient is awake and alert. No acute distress. Eyes:  . PERRLA . EOMBI . Normal lids and conjunctivae Respiratory:  . No increased work of breathing. . No wheezes, rales, or rhonchi . No tactile fremitus Cardiovascular:  . Regular rate and rhythm . No murmurs, ectopy, or gallups. . No lateral PMI. No thrills. Abdomen:  . Abdomen is soft, non-tender, non-distended . No hernias, masses, or organomegaly . Normoactive bowel sounds.  Musculoskeletal:  . No cyanosis, clubbing, or edema Skin:  . No rashes, lesions, ulcers . palpation of skin: no induration or nodules Psychiatric:  . Mental status o Mood, affect appropriate  I have personally reviewed the following:   Today's Data  . Vitals, CMP, CBC  Scheduled Meds: .  stroke: mapping our early stages of recovery book   Does not apply Once  . aspirin  81 mg Oral Daily   Or  . aspirin  81 mg Per Tube Daily  . chlorhexidine  15 mL Mouth Rinse BID  . Chlorhexidine Gluconate Cloth  6 each Topical Daily  . DULoxetine  60 mg Oral Daily  . insulin aspart  0-15 Units Subcutaneous Q4H  . insulin glargine  15 Units Subcutaneous QHS  . mouth rinse  15 mL Mouth Rinse q12n4p  . QUEtiapine  25 mg Per Tube  QHS   And  . QUEtiapine  12.5 mg Per Tube Daily  . ticagrelor  90 mg Oral BID   Or  . ticagrelor  90 mg Per Tube BID   Continuous Infusions: . sodium chloride 75 mL/hr at 03/05/19 1330  . sodium chloride    . phenylephrine (NEO-SYNEPHRINE) Adult infusion Stopped (03/03/19 1221)    Active Problems:   Stroke (cerebrum) (HCC)   Middle cerebral artery embolism, right   Tachypnea   SIRS (systemic inflammatory response syndrome) (HCC)   Hypokalemia   Diabetes mellitus type 2  in obese (HCC)   Tobacco abuse   History of breast cancer   Cognitive deficits   Dysphagia, post-stroke   LOS: 4 days   A & P  Acute metabolic enecphalopathy after CVA - right M1 occlusion. Now s/p thrombectomy 11/23. R/o seizure: Seizure-like activity with stimulation vs myoclonus. Resolved.  resolved The patient remains confused . Per neurology. Echocardiogram was obtained which demonstrated EF 60-65% No regional wall motion abnormalities. Normal global RV systolic function. Normal diastolic parameters. Systolic blood pressure goal 120-140 per interventional radiology.   Hypotension:  Resolved. Systolic blood pressure goal of 120-140.  Non-anion gap Metabolic acidosis: Resolved  Hypokalemia: Supplemented and monitor. Hypomagnesemia: Supplemented and monitor.  Hyperlipidemia: Continue home [facility]  Type 2 DM w/ hyperglycemia: A1C 9.2 The patient is receiving Lantus 10 units qhs with correction insulin.  GERD: Continue Prilosec.  Hx anxiety/depression: Continue home Prozac   I have seen and examined this patient myself. I have spent 32 minutes in her evaluation and care.  Jemmie Rhinehart, DO [facility] Direct contact: see [email or web address]  7PM-7AM contact night coverage as above 03/06/2019, 5:56 PM  LOS: 4 days

## 2019-03-06 NOTE — Progress Notes (Signed)
Physical Therapy Treatment Patient Details Name: Ann Lopez MRN: [id number] DOB: [birth year] Today's Date: 03/06/2019    History of Present Illness 48 yo female admitted 03/02/19 with R MCA s/p fall from commode due to L side weakness NIH 12 R gaze preference and L neglect. pt under went R common carotid aterigogram complete revascularixation of R ICA  R MCA and R ACA via emergent mechanical thrombectomy  PMH Breast CA DM asthma depression current smoker gastric bypass 2013 IBS    PT Comments    Pt supine in bed on arrival and eager to "walk"  PTA had to educate patient on her deficits and explain the need to be able to stand with good control before walking.  She continues to present with weakness in L leg and LUE.  PTA facilitated use of LUE as it remains flaccid.  Continue to recommend CIR based on functional limitations.     Follow Up Recommendations  CIR     Equipment Recommendations  Other (comment)(TBD)    Recommendations for Other Services Rehab consult     Precautions / Restrictions Precautions Precautions: Fall Restrictions Weight Bearing Restrictions: No    Mobility  Bed Mobility Overal bed mobility: Needs Assistance Bed Mobility: Rolling;Sidelying to Sit;Sit to Sidelying Rolling: Mod assist Sidelying to sit: Mod assist       General bed mobility comments: Pt rolled to L side of bed and able to reach for railing with R arm to help advance to edge of bed.  Used of bed pad to move L side forward.  Transfers Overall transfer level: Needs assistance Equipment used: Ambulation equipment used(sara stedy) Transfers: Sit to/from Stand Sit to Stand: Mod assist         General transfer comment: Pt able to achieve standing with use of sara stedy.  With use of sara stedy the lift equipment blocked L knee and PTA assisted in facilitation on L arm.  Once in standing focused on lateral weight shifting.  Ambulation/Gait Ambulation/Gait assistance: (NT performed pre  gt in stedy with weight shifting laterally.)               Stairs             Wheelchair Mobility    Modified Rankin (Stroke Patients Only) Modified Rankin (Stroke Patients Only) Pre-Morbid Rankin Score: No symptoms Modified Rankin: Severe disability     Balance                                            Cognition Arousal/Alertness: Awake/alert Behavior During Therapy: Anxious;Impulsive Overall Cognitive Status: Impaired/Different from baseline Area of Impairment: Following commands;Safety/judgement;Problem solving                       Following Commands: Follows one step commands inconsistently;Follows one step commands with increased time Safety/Judgement: Decreased awareness of safety;Decreased awareness of deficits Awareness: Intellectual Problem Solving: Difficulty sequencing;Requires verbal cues;Requires tactile cues General Comments: Pt wanting to walk and get in the shower despite L side with falcidity.  Required max VCs and education on the progression of standing first before walking.      Exercises      General Comments        Pertinent Vitals/Pain Pain Assessment: No/denies pain Faces Pain Scale: No hurt    Home Living     Available Help at Discharge: Available  24 hours/day Type of Home: Mobile home              Prior Function            PT Goals (current goals can now be found in the care plan section) Acute Rehab PT Goals Patient Stated Goal: unable Potential to Achieve Goals: Good Progress towards PT goals: Progressing toward goals    Frequency    Min 4X/week      PT Plan Current plan remains appropriate    Co-evaluation              AM-PAC PT "6 Clicks" Mobility   Outcome Measure  Help needed turning from your back to your side while in a flat bed without using bedrails?: Total Help needed moving from lying on your back to sitting on the side of a flat bed without using  bedrails?: Total Help needed moving to and from a bed to a chair (including a wheelchair)?: Total Help needed standing up from a chair using your arms (e.g., wheelchair or bedside chair)?: Total Help needed to walk in hospital room?: Total Help needed climbing 3-5 steps with a railing? : Total 6 Click Score: 6    End of Session Equipment Utilized During Treatment: Gait belt Activity Tolerance: Patient tolerated treatment well Patient left: in bed;with nursing/sitter in room Nurse Communication: Mobility status PT Visit Diagnosis: Hemiplegia and hemiparesis Hemiplegia - Right/Left: Left Hemiplegia - dominant/non-dominant: Non-dominant Hemiplegia - caused by: Cerebral infarction     [id number] PT Time Calculation (min) (ACUTE ONLY): 28 min  Charges:  $Therapeutic Activity: 23-37 mins                     Erasmo Leventhal , PTA Acute Rehabilitation Services Pager [phone] Office [phone]     Morrison Mcbryar Eli Hose 03/06/2019, 2:19 PM

## 2019-03-06 NOTE — Care Management Important Message (Signed)
Important Message  Patient Details  Name: ANACRISTINA PORTANOVA MRN: [id number] Date of Birth: 03/01/1971   Medicare Important Message Given:  Yes     Shelda Altes 03/06/2019, 2:23 PM

## 2019-03-06 NOTE — Progress Notes (Signed)
STROKE TEAM PROGRESS NOTE   INTERVAL HISTORY Patient doing well today.  She is sitting up in bed.  She still has significant left hemiplegia.  She has lot of questions about her stroke which I answered.  She has been evaluated by therapy and rehab and felt to be a good patient for inpatient rehab..  Vitals:   03/05/19 1950 03/05/19 2257 03/06/19 0259 03/06/19 0727  BP: 115/66 118/63 (!) 112/101 (!) 122/58  Pulse: (!) 52 (!) 54 (!) 50 (!) 57  Resp: 18 17 16 18   Temp: (!) 97.3 F (36.3 C) (!) 97.5 F (36.4 C) 98.2 F (36.8 C) 97.8 F (36.6 C)  TempSrc: Oral Oral Oral Oral  SpO2: 99% 99% 100% 99%  Weight:      Height:        CBC:  Recent Labs  Lab 03/02/19 0823  03/03/19 1453 03/05/19 0343 03/06/19 0312  WBC 16.9*   < > 20.0* 15.4* 12.6*  NEUTROABS 14.0*  --  15.4*  --   --   HGB 14.9   < > 12.4 11.8* 11.5*  HCT 44.6   < > 37.3 35.5* 34.4*  MCV 89.0   < > 89.4 88.1 88.7  PLT 235   < > 222 202 216   < > = values in this interval not displayed.    Basic Metabolic Panel:  Recent Labs  Lab 03/04/19 0417 03/04/19 1612 03/05/19 0343 03/06/19 0312  NA 140  --  138 140  K 3.6  --  3.4* 3.8  CL 109  --  110 109  CO2 21*  --  19* 23  GLUCOSE 177*  --  116* 97  BUN 10  --  9 6  CREATININE 0.59  --  0.64 0.49  CALCIUM 8.7*  --  8.3* 8.3*  MG 1.7 1.6*  --  1.7  PHOS 2.9 2.9  --   --    Lipid Panel:     Component Value Date/Time   CHOL 166 03/03/2019 0446   TRIG 515 (H) 03/03/2019 0446   HDL 23 (L) 03/03/2019 0446   CHOLHDL 7.2 03/03/2019 0446   VLDL UNABLE TO CALCULATE IF TRIGLYCERIDE OVER 400 mg/dL 03/03/2019 0446   LDLCALC UNABLE TO CALCULATE IF TRIGLYCERIDE OVER 400 mg/dL 03/03/2019 0446   HgbA1c:  Lab Results  Component Value Date   HGBA1C 9.0 (H) 03/03/2019   Urine Drug Screen:     Component Value Date/Time   LABOPIA POSITIVE (A) 03/02/2019 1240   COCAINSCRNUR NONE DETECTED 03/02/2019 1240   LABBENZ NONE DETECTED 03/02/2019 1240   AMPHETMU POSITIVE (A)  03/02/2019 1240   THCU NONE DETECTED 03/02/2019 1240   LABBARB NONE DETECTED 03/02/2019 1240    Alcohol Level     Component Value Date/Time   ETH <10 03/02/2019 0823    IMAGING  Ct Head Wo Contrast  Result Date: 03/04/2019 CLINICAL DATA:  Stroke, follow-up. EXAM: CT HEAD WITHOUT CONTRAST TECHNIQUE: Contiguous axial images were obtained from the base of the skull through the vertex without intravenous contrast. COMPARISON:  CT head without contrast 03/02/2019 CTA head and neck 03/02/2019 FINDINGS: Brain: There is residual hyperdensity within the infarcted right lentiform nucleus. This likely represents resolving confluent petechial hemorrhage surrounding edema is again noted. There is partial effacement of the right lateral ventricle without midline shift. No new cortical infarct is present. Left hemisphere is unremarkable. Brainstem and cerebellum are normal. Vascular: No hyperdense vessel or unexpected calcification. Skull: Calvarium is intact. No focal lytic  or blastic lesions are present. Sinuses/Orbits: Ethmoid sinus opacification is stable. No new sinus disease is present. The globes and orbits are within normal limits. IMPRESSION: 1. Residual hyperdensity within the infarcted right lentiform nucleus compatible with resolving confluent petechial hemorrhage, PH2. 2. No new cortical infarct. 3. Persistent partial effacement of the right lateral ventricle without midline shift. Electronically Signed   By: San Morelle M.D.   On: 03/04/2019 15:36   Cerebral Angio  01/30/2019 S/P rt COMMON CAROTID ARTERIOGRAM,FOLLOWED BY COMPLETE REVASCULAR IZATION OF OCCLUDED  RT ICA TERMINUS,RT MCA AND RT ACA WITH X PASS WITH 4MMX 40 MM SOLITAIRE X fr RETRIEVER DEVICE AND PENUMBRA ASPIRATION ACHIEVING A tici 2c REVASCULARIZATION . S/P STENT ASSISTED ANGIOPLASTY OF SYMPTOTMATIC PROX RT ICA STENOSIS.Marland Kitchen  EEG 03/02/2019 This study is suggestive of profound diffuse encephalopathy, non specific to etiology  but could be secondary to sedation. No seizures or epileptiform discharges were seen throughout the recording.  2D Echocardiogram  1. Left ventricular ejection fraction, by visual estimation, is 60 to 65%. The left ventricle has normal function. There is no left ventricular hypertrophy.  2. The left ventricle has no regional wall motion abnormalities.  3. Global right ventricle has normal systolic function.The right ventricular size is normal. No increase in right ventricular wall thickness.  4. Left atrial size was normal.  5. Right atrial size was normal.  6. The mitral valve is normal in structure. No evidence of mitral valve regurgitation. No evidence of mitral stenosis.  7. The tricuspid valve is normal in structure. Tricuspid valve regurgitation is not demonstrated.  8. The aortic valve is normal in structure. Aortic valve regurgitation is not visualized. No evidence of aortic valve sclerosis or stenosis.  9. The pulmonic valve was normal in structure. Pulmonic valve regurgitation is not visualized. 10. Normal pulmonary artery systolic pressure. 11. The inferior vena cava is dilated in size with >50% respiratory variability, suggesting right atrial pressure of 8 mmHg.  PHYSICAL EXAM   Middle-aged Caucasian lady is not in distress. Afebrile. Head is nontraumatic. Neck is supple without bruit.    Cardiac exam no murmur or gallop. Lungs are clear to auscultation. Distal pulses are well felt. Neurological Exam :  Patient is awake alert and interactive.  Speech is normal.  There is no dysarthria or aphasia.  She has right gaze preference and can look partially to the left.  She has dense left homonymous hemianopsia.  She has dysarthria but can be easily understood.  She follows commands well.  There is left lower facial weakness.  There is left hemineglect.  There is left dense hemiplegia with 1/5 left upper extremity and 2/5 left lower extremity strength.  She has normal strength on the right.   Sensation is diminished on the left.  Tone is diminished on the left normal on the right.  Plantars left is upgoing right is downgoing.  ASSESSMENT/PLAN Ann Lopez is a 48 y.o. female with history of DB, HLD and breast cancer who woke with L sided weakness, R gaze and neuro neglect. CT showed a large penumbra w/ small core and R M1 occlusion taken for mechanical thrombectomy.   Stroke:   R MCA infarct s/p IR w/ R ICA stent placement in setting of large vessel disease  Code Stroke CT head No acute abnormality.  CTA head R ICA terminus near occlusion extending into R M1 and R A1. High grade stenoses R M2 branches.  CTA neck R ICA origin/carotid bulb hypodensity w/ high grade  stenosis. 1.7cm L thyroid nodule.  CT perfusion R MCA 28mL core infarct w/ 45mL penumbra. Mismatch vol 65 mL.  Cerebral Angio R ICA, R MCA and R AVA occlusion w/ TICI2C revascularization w/ solitaire and penumbra. Stent angioplasty R ICA stenosis    Post IR CT contrast blush in the caudate and putamen nuclei. NO gross mass effect or ICH seen.  MRI  (clausophobia, given Haldol and ativan on call). cancel MRI   Repeat CT head to confirm stroke w/ sedation plan for later today  2D Echo EF 60-65%. No source of embolus   EEG neg sz  LDL UTC d/t TG 515. direct LDL pending   HgbA1c 9.2  SCDs for VTE prophylaxis  aspirin 81 mg daily prior to admission, now on aspirin 81 mg daily and Brilinta (ticagrelor) 90 mg bid. Continue DAPT.  Therapy recommendations:  CIR. Consult placed.    Disposition:  pending   Transfer to the floor  Hypotension, resolved  Home meds:  On lasix, no hx HTN  Off neo  BP goal < 180  BP 150s  (BP cuff on leg d/t double mastectomies)  Hyperlipidemia  Home meds:  tricor 145  LDL UTC d/t high TG 515, LDL goal < 70. TC 166   Check direct LDL pending   Consider statin based on LDL results  Diabetes type II Uncontrolled  Home meds:  lantus 20    Now on lantus  10  HgbA1c 9.2, goal < 7.0  CBGs  SSI  Increase lantus. Continue towards home dose if po intake good today  Dysphagia . Secondary to stroke . Speech on board . Cleared for D2 thin . D/c NG placed yesterday   Other Stroke Risk Factors  Cigarette smoker, advised to stop smoking  Substance abuse - pt denies use of speed, is on narcs for chronic pain - UDS positive for amphetamines and opiates   Obesity, Body mass index is 32.96 kg/m., recommend weight loss, diet and exercise as appropriate   Migraine. Seen by DDr. Krista Blue in 11/2014 and put on topamax 50 bid and maxalt prn  Other Active Problems  Breast cancer 2009 s/p surgical  Post chemo memory deficit. Seen by Dr. Tomi Likens who recommended neuro psych testing. No f/u since 08/2015    1.7cm L thyroid nodule - non emergent [place] recommended for f/u  Anxiety/depression. On Cymbalta  Lactic acid 3.4. UCx w/ 50K ecoli. No tx at this time.  Hypokalemia, resolved 4.0->3.2->3.3->3.6->3.4->3.8  Patient agitated, constantly moving. Per family, behavior is abnormal at baseline. Continue to monitor.   Leukocytosis WBC 22.4->20.0->15.4->12.6  Bradycardia - 50's  Mild anemia - 11.5  UDS - positive for opiates (Rx) and amphetamines  Hospital day # 4 Plan patient agreeable to transfer to inpatient rehab when bed available hopefully in the next few days after insurance approval.  I spoke to the patient and her son and answered questions.  Hab consult..  Continue aggressive risk factor modification.  Greater than 50% time during this 25-minute visit was spent in counseling and coordination of care and discussion with care team and answering questions.  Antony Contras, MD  To contact Stroke Continuity provider, please refer to [email or web address] After hours, contact General Neurology

## 2019-03-06 NOTE — Progress Notes (Signed)
Inpatient Rehab Admissions:   I met with pt at the bedside for rehabilitation assessment and to discuss goals and expectations of an inpatient rehab admission. Pt aware of program and willing to pursue intensive rehab if her insurance approves. I spoke with her about expectations, anticipated length of stay and anticipated DC needs. With pt permission I spoke with her son to clarify DC support and see what was available to her. Pt anticipates he can get 24/7 A at DC and plans to confirm this by Monday. AC will begin insurance authorization process for possible admit.   Will follow up Monday.   Jhonnie Garner, OTR/L  Rehab Admissions Coordinator  [phone] 03/06/2019 12:03 PM

## 2019-03-06 NOTE — PMR Pre-admission (Signed)
PMR Admission Coordinator Pre-Admission Assessment  Patient: Ann Lopez is an 48 y.o., female MRN: [id number] DOB: [birth year] Height: _0  (162.6 cm) Weight: 87.1 kg              Insurance Information HMO: yes    PPO:   PCP:      IPA:      80/20:      OTHER:  PRIMARY: [place] Medicare (dual complete)      Policy#: [id number]      Subscriber: Patient CM Name: Ann Lopez      Phone#: [phone]     Fax#: [phone] Pre-Cert#: [id number]      Employer:    Bernadene Bell case number: [id number] [place] provided by Ann Lopez at [facility] for admit to CIR. Pt is approved for 7 days. (initial start start date 11/28 - next review date 12/4). Per Ann Lopez, review date will change once admit date confirmed-as pt is approved for 7 days from day of admit. Concurrent reviews are due to (f): [phone] with heading "for continued stay."  Benefits:  Phone #: online     Name: [email or web address] Eff. Date: 04/09/2018 - 04/09/2019     Deduct: does not have      Out of Pocket Max: $6,700 ($0 met)      Life Max: NA CIR: $225/admission co-pay      SNF: $0/day co-pay for days 1-20, $176/day co-pay for days 21-100; limited to 100 days/cal yr Outpatient: limited by medical necessity     Co-Pay: $0/visit co-pay Home Health: 100% coverage; limited by medical necessity      Co-Pay: 0% co-insurance DME: 80% coverage     Co-Pay: 20% co-insurance Providers:   *QMB Status-if enrolled in Medicaid as a QMB, patient's co-pays and co-insurance are covered.  SECONDARY: Medicaid [place]      Policy#: [id number]      Subscriber: Patient CM Name:       Phone#:      Fax#:  Pre-Cert#:       Employer:  Benefits:  Phone #: [phone]     Name: verified eligibility via [employer] on 03/09/2019 Eff. Date: verified on 03/09/2019     Deduct:       Out of Pocket Max:       Life Max:  CIR:       SNF:  Outpatient:      Co-Pay:  Home Health:       Co-Pay:  DME:      Co-Pay:   Medicaid Application Date:       Case Manager:  Disability Application Date:        Case Worker:   The "Data Collection Information Summary" for patients in Inpatient Rehabilitation Facilities with attached "Privacy Act [place] Records" was provided and verbally reviewed with: Patient  Emergency Contact Information Contact Information    Name Relation Home Work [place] Son [phone]  [phone]   Farrel Gobble   [phone]     Current Medical History  Patient Admitting Diagnosis: Right ICA occlusion   History of Present Illness: Ann Lopez is a 48 year old female with history of T2DM, HTN, gastric bypass, breast cancer s/p bilateral mastectomy and post chemo memory deficits, tobacco use, migraines, back pain who was admitted on 03/02/19 after fall from the commode and found to have left sided weakness with left neglect and right gaze preference. UDS positive with opioids and amphetamines.  CTA/P head revealed near occlusive thrombus within R-ICA terminus extending into  M1  R-MCA with A1 R-ACA with core infarct in R-MCA and large penumbra as well as incidental finding of left thyroid nodule. She was taken for cerebral angio with emergent thrombectomy and complete revascularization of occluded R- ICA terminus, R-MCA and R-ACA with stent assisted angioplasty of symptomatic proximal R-ICA stenosis by DR. Deveshwar. Post procedure with hypotension felt to be due to carotid manipulation as well as decrease in LOC with increase in LLE tone. EEG showed diffuse encephalopathy and repeat CT head showed some mass effect on right lateral ventricle and was negative for hemorrhage.    She tolerated extubation without difficulty but had bouts of agitation alternating with lethargy. 2D echo showed EF 60-65% with no LVH and no wall abnormality.  Most recent CT head 11/25 showed residual hyperdensity within infarcted right lentiform nucleus c/w resolving confluent petechial hemorrhage and persistent partial effacement of right lateral ventricle. Dr. Erlinda Hong  felt that stroke embolic due to large vessel stenosis but PAF not fully ruled out--30 day cardiac event monitor recommended on outpatient basis. Patient with resultant left facial droop with cognitive deficits, left sided weakness and right gaze preference with left neglect affecting mobility and ADLs. CIR recommended due to functional decline. Pt is to admit to CIR on 03/09/2019.   Complete NIHSS TOTAL: 7 Glasgow Coma Scale Score: 15  Past Medical History  Past Medical History:  Diagnosis Date  . Allergy   . Anxiety   . Asthma   . Back pain   . Breast cancer ([place])   . Cancer ([place])   . Depression   . Diabetes mellitus   . Headache   . Hyperlipemia   . Osteopenia   . Osteoporosis    osteopenia    Family History  family history includes Cancer in her sister; Cancer (age of onset: 70) in her mother; Cancer (age of onset: 75) in her maternal grandfather and maternal grandmother; Hypertension in her sister; Hypothyroidism in her brother and sister.  Prior Rehab/Hospitalizations:  Has the patient had prior rehab or hospitalizations prior to admission? No  Has the patient had major surgery during 100 days prior to admission? Yes  Current Medications   Current Facility-Administered Medications:  .   stroke: mapping our early stages of recovery book, , Does not apply, Once, Roland Rack P, MD .  0.9 %  sodium chloride infusion, 250 mL, Intravenous, Continuous, Kary Kos, Peter E, NP .  0.9 %  sodium chloride infusion, , Intravenous, Continuous, Regalado, Belkys A, MD, Last Rate: 100 mL/hr at 03/09/19 0854 .  acetaminophen (TYLENOL) tablet 650 mg, 650 mg, Oral, Q4H PRN, 650 mg at 03/08/19 2230 **OR** acetaminophen (TYLENOL) 160 MG/5ML solution 650 mg, 650 mg, Per Tube, Q4H PRN, 650 mg at 03/05/19 0502 **OR** acetaminophen (TYLENOL) suppository 650 mg, 650 mg, Rectal, Q4H PRN, Greta Doom, MD .  aspirin chewable tablet 81 mg, 81 mg, Oral, Daily, 81 mg at 03/09/19 0856  **OR** aspirin chewable tablet 81 mg, 81 mg, Per Tube, Daily, Deveshwar, Sanjeev, MD, 81 mg at 03/04/19 0917 .  chlorhexidine (PERIDEX) 0.12 % solution 15 mL, 15 mL, Mouth Rinse, BID, Deveshwar, Sanjeev, MD, 15 mL at 03/09/19 0854 .  Chlorhexidine Gluconate Cloth 2 % PADS 6 each, 6 each, Topical, Daily, Luanne Bras, MD, 6 each at 03/09/19 0856 .  DULoxetine (CYMBALTA) DR capsule 60 mg, 60 mg, Oral, Daily, Greta Doom, MD, 60 mg at 03/09/19 0856 .  fenofibrate tablet 160 mg, 160 mg, Oral, Daily, Rosalin Hawking, MD, 160 mg at  03/09/19 0854 .  HYDROcodone-acetaminophen (NORCO/VICODIN) 5-325 MG per tablet 1 tablet, 1 tablet, Oral, Q6H PRN, Candee Furbish, MD, 1 tablet at 03/09/19 0407 .  insulin aspart (novoLOG) injection 0-15 Units, 0-15 Units, Subcutaneous, Q4H, Greta Doom, MD, 2 Units at 03/09/19 0108 .  insulin glargine (LANTUS) injection 15 Units, 15 Units, Subcutaneous, QHS, Biby, Sharon L, NP, 15 Units at 03/08/19 2204 .  MEDLINE mouth rinse, 15 mL, Mouth Rinse, q12n4p, Deveshwar, Sanjeev, MD, 15 mL at 03/07/19 1540 .  pantoprazole (PROTONIX) EC tablet 40 mg, 40 mg, Oral, Daily, Swayze, Ava, DO, 40 mg at 03/09/19 0854 .  QUEtiapine (SEROQUEL) tablet 25 mg, 25 mg, Per Tube, QHS, 25 mg at 03/05/19 2210 **AND** QUEtiapine (SEROQUEL) tablet 12.5 mg, 12.5 mg, Per Tube, Daily, Biby, Sharon L, NP, 12.5 mg at 03/09/19 0855 .  senna-docusate (Senokot-S) tablet 1 tablet, 1 tablet, Oral, BID, Regalado, Belkys A, MD .  temazepam (RESTORIL) capsule 15 mg, 15 mg, Oral, QHS PRN, Swayze, Ava, DO .  ticagrelor (BRILINTA) tablet 90 mg, 90 mg, Oral, BID, 90 mg at 03/09/19 0855 **OR** ticagrelor (BRILINTA) tablet 90 mg, 90 mg, Per Tube, BID, Deveshwar, Sanjeev, MD, 90 mg at 03/04/19 2947  Patients Current Diet:  Diet Order            DIET DYS 2 Room service appropriate? Yes; Fluid consistency: Thin  Diet effective now              Precautions /  Restrictions Precautions Precautions: Fall Other Brace: L ankle wrapped with ACE for df assist Restrictions Weight Bearing Restrictions: No   Has the patient had 2 or more falls or a fall with injury in the past year?No  Prior Activity Level Community (5-7x/wk): worked as a Optometrist 2x/week for elderly gentleman; very active, has 2 sons in the house. drives and was Independent PTA.   Prior Functional Level Prior Function Level of Independence: Independent  Self Care: Did the patient need help bathing, dressing, using the toilet or eating?  Independent  Indoor Mobility: Did the patient need assistance with walking from room to room (with or without device)? Independent  Stairs: Did the patient need assistance with internal or external stairs (with or without device)? Independent  Functional Cognition: Did the patient need help planning regular tasks such as shopping or remembering to take medications? Independent  Home Assistive Devices / Equipment Home Assistive Devices/Equipment: Eyeglasses  Prior Device Use: Indicate devices/aids used by the patient prior to current illness, exacerbation or injury? None of the above  Current Functional Level Cognition  Arousal/Alertness: Awake/alert Overall Cognitive Status: Impaired/Different from baseline Difficult to assess due to: Level of arousal Current Attention Level: Sustained Orientation Level: Oriented X4 Following Commands: Follows one step commands inconsistently, Follows one step commands with increased time Safety/Judgement: Decreased awareness of safety, Decreased awareness of deficits General Comments: When asked, she can remember to attend to L side, but does not do so without cues.  Attention: Sustained Sustained Attention: Impaired Sustained Attention Impairment: Verbal basic, Functional basic Memory: (difficult assess) Awareness: Impaired Awareness Impairment: Intellectual impairment, Emergent impairment Problem  Solving: Impaired Problem Solving Impairment: Verbal basic, Functional basic Behaviors: Restless, Impulsive, Poor frustration tolerance Safety/Judgment: Impaired    Extremity Assessment (includes Sensation/Coordination)  Upper Extremity Assessment: RUE deficits/detail, LUE deficits/detail RUE Deficits / Details: AROM in all planes LUE Deficits / Details: noted to adducted toward chest, no attempt to reposition, spontaneous hand movement. pt squeezing with palm pressure  Lower Extremity Assessment:  Defer to PT evaluation LLE Deficits / Details: noted to have supination of foot with don of socks    ADLs  Overall ADL's : Needs assistance/impaired Eating/Feeding: NPO Eating/Feeding Details (indicate cue type and reason): fixated on drinking and taking small sip of water during session Grooming: Maximal assistance Upper Body Bathing: Maximal assistance Lower Body Bathing: Total assistance Upper Body Dressing : Maximal assistance Lower Body Dressing: Total assistance Toilet Transfer: +2 for physical assistance, Maximal assistance General ADL Comments: pt initiates sit<Stand with LLE blocked and facilitation at core for upright posture    Mobility  Overal bed mobility: Needs Assistance Bed Mobility: Rolling, Sidelying to Sit, Sit to Sidelying Rolling: Mod assist Sidelying to sit: Mod assist Sit to sidelying: Total assist, +2 for physical assistance, +2 for safety/equipment General bed mobility comments: pt received in recliner after bathing with nursing    Transfers  Overall transfer level: Needs assistance Equipment used: Left platform walker Transfers: Sit to/from Stand Sit to Stand: +2 safety/equipment, Mod assist General transfer comment: mod A +2 for power up and maintaining midline, L knee not blocked with transfer    Ambulation / Gait / Stairs / Wheelchair Mobility  Ambulation/Gait Ambulation/Gait assistance: Mod assist, +2 physical assistance, +2 safety/equipment Gait  Distance (Feet): 18 Feet Assistive device: Left platform walker Gait Pattern/deviations: Step-to pattern, Decreased stride length, Decreased dorsiflexion - left, Decreased weight shift to left, Narrow base of support, Trunk flexed, Scissoring General Gait Details: At first needed facilitation to not scissor with LLE, but then was able to correct with vc's. Support needed at L knee to prevent buckling and hyperextension, needed more support as she fatigued. Needed mod A to guide RW, without assist, veered to R. vc's for stopping frequently to correct posture  Gait velocity: decreased Gait velocity interpretation: <1.31 ft/sec, indicative of household ambulator    Posture / Balance Balance Overall balance assessment: Needs assistance Sitting-balance support: No upper extremity supported, Feet unsupported Sitting balance-Leahy Scale: Poor Postural control: Left lateral lean Standing balance support: No upper extremity supported Standing balance-Leahy Scale: Poor Standing balance comment: with vc's to push down through RW with RUE, able to maintain static standing within RW with min A. Mod A +2 needed for dynamics    Special needs/care consideration BiPAP/CPAP: no CPM: no Continuous Drip IV: 0.9% sodium chloride infusion  Dialysis: no        Days: no Life Vest: no Oxygen: no Special Bed: no Trach Size: no Wound Vac (area): no      Location: NA Skin: right hand ecchymosis, right groin incision       Bowel mgmt:continent, last BM 03/07/2019 Bladder mgmt: external urinary catheter Diabetic mgmt: yes Behavioral consideration : no Chemo/radiation : not currently (history of chemo for breast cancer-has had double mastectomy).      Previous Home Environment (from acute therapy documentation) Living Arrangements: Spouse/significant other, Children  Lives With: Son(6 year old) Available Help at Discharge: Available 24 hours/day Type of Home: Mobile home Home Layout: One level Home Access:  Stairs to enter Entrance Stairs-Rails: Can reach both Entrance Stairs-Number of Steps: 3 Home Care Services: No Additional Comments: information per chart from PT session. pt does not sustain attention to questions. pt does report she needs to get out of here to work to pay her mortage she cant loose her house  Discharge Living Setting Plans for Discharge Living Setting: Other (Comment)(plan is to go to her son's house ([place])) Type of Home at Discharge: House Discharge Home Layout:  One level Discharge Home Access: Stairs to enter Entrance Stairs-Rails: Can reach both Entrance Stairs-Number of Steps: 2 Discharge Bathroom Shower/Tub: Tub/shower unit, Walk-in shower(grab bars near shower and toilet) Discharge Bathroom Toilet: Standard Discharge Bathroom Accessibility: Yes How Accessible: Accessible via walker Does the patient have any problems obtaining your medications?: No  Social/Family/Support Systems 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 Information: son Larkin Ina): [phone]; Son Marcene Brawn): [phone] Anticipated Caregiver: two older sons (not the ones she lives with) Anticipated Caregiver's Contact Information: see above Ability/Limitations of Caregiver: Min/Mod A Caregiver Availability: 24/7 Discharge Plan Discussed with Primary Caregiver: Yes(two older sons plan to rotate scheduled shifts to accomodate) Is Caregiver In Agreement with Plan?: Yes Does Caregiver/Family have Issues with Lodging/Transportation while Pt is in Rehab?: No   Goals/Additional Needs Patient/Family Goal for Rehab: PT/OT: Min A; SLP: Supervision/Min A Expected length of stay: 18-22 days Cultural Considerations: NA Dietary Needs: DYS 2, thin liquids Equipment Needs: TBD Pt/Family Agrees to Admission and willing to participate: Yes Program Orientation Provided & Reviewed with Pt/Caregiver Including Roles  & Responsibilities: Yes(pt, Larkin Ina and Jesus (two grown sons))  Barriers to  Discharge: Home environment access/layout  Barriers to Discharge Comments: steps to enter; Jesus and Larkin Ina to arrange their schedules for 24/7; they are aware of no SNF option after CIR   Decrease burden of Care through IP rehab admission: NA   Possible need for SNF placement upon discharge:Not anticipated; pt's family has confirmed 24/7 A at DC and understand the plan is for pt to go home with assist once she has completed the CIR program. Pt has great support and two sons who can physically assist as needed. Anticipate pt will make good progress through CIR level program.    Patient Condition: This patient's medical and functional status has changed since the consult dated: 03/04/2019 in which the Rehabilitation Physician determined and documented that the patient's condition is appropriate for intensive rehabilitative care in an inpatient rehabilitation facility. See "History of Present Illness" (above) for medical update. Functional changes are: improvement in bed mobility from Total A +2 to Mod A, and an ability to tolerate gait with Mod A + 2 for 81f. Patient's medical and functional status update has been discussed with the Rehabilitation physician and patient remains appropriate for inpatient rehabilitation. Will admit to inpatient rehab today.  Preadmission Screen Completed By:  KRaechel Ache OT, 03/09/2019 11:36 AM ______________________________________________________________________   Discussed status with Dr. SNaaman Plummeron 03/09/2019 at 11:36AM and received approval for admission today.  Admission Coordinator:  KRaechel Ache time 11:36AM/Date 03/09/2019

## 2019-03-07 DIAGNOSIS — I69391 Dysphagia following cerebral infarction: Secondary | ICD-10-CM

## 2019-03-07 DIAGNOSIS — E669 Obesity, unspecified: Secondary | ICD-10-CM

## 2019-03-07 DIAGNOSIS — I63511 Cerebral infarction due to unspecified occlusion or stenosis of right middle cerebral artery: Secondary | ICD-10-CM

## 2019-03-07 DIAGNOSIS — Z720 Tobacco use: Secondary | ICD-10-CM

## 2019-03-07 LAB — COMPREHENSIVE METABOLIC PANEL
ALT: 28 U/L (ref 0–44)
AST: 20 U/L (ref 15–41)
Albumin: 2.9 g/dL — ABNORMAL LOW (ref 3.5–5.0)
Alkaline Phosphatase: 98 U/L (ref 38–126)
Anion gap: 11 (ref 5–15)
BUN: 8 mg/dL (ref 6–20)
CO2: 21 mmol/L — ABNORMAL LOW (ref 22–32)
Calcium: 8.8 mg/dL — ABNORMAL LOW (ref 8.9–10.3)
Chloride: 108 mmol/L (ref 98–111)
Creatinine, Ser: 0.54 mg/dL (ref 0.44–1.00)
GFR calc Af Amer: >60 mL/min (ref >60)
GFR calc non Af Amer: >60 mL/min (ref >60)
Glucose, Bld: 87 mg/dL (ref 70–99)
Potassium: 3.8 mmol/L (ref 3.5–5.1)
Sodium: 140 mmol/L (ref 135–145)
Total Bilirubin: 1.1 mg/dL (ref 0.3–1.2)
Total Protein: 6.1 g/dL — ABNORMAL LOW (ref 6.5–8.1)

## 2019-03-07 LAB — CBC WITH DIFFERENTIAL/PLATELET
Abs Immature Granulocytes: 0.12 10*3/uL — ABNORMAL HIGH (ref 0.00–0.07)
Basophils Absolute: 0.1 10*3/uL (ref 0.0–0.1)
Basophils Relative: 1 %
Eosinophils Absolute: 0.3 10*3/uL (ref 0.0–0.5)
Eosinophils Relative: 3 %
HCT: 36.1 % (ref 36.0–46.0)
Hemoglobin: 12 g/dL (ref 12.0–15.0)
Immature Granulocytes: 1 %
Lymphocytes Relative: 16 %
Lymphs Abs: 2 10*3/uL (ref 0.7–4.0)
MCH: 29.2 pg (ref 26.0–34.0)
MCHC: 33.2 g/dL (ref 30.0–36.0)
MCV: 87.8 fL (ref 80.0–100.0)
Monocytes Absolute: 1 10*3/uL (ref 0.1–1.0)
Monocytes Relative: 8 %
Neutro Abs: 9.1 10*3/uL — ABNORMAL HIGH (ref 1.7–7.7)
Neutrophils Relative %: 71 %
Platelets: 238 10*3/uL (ref 150–400)
RBC: 4.11 MIL/uL (ref 3.87–5.11)
RDW: 13.4 % (ref 11.5–15.5)
WBC: 12.6 10*3/uL — ABNORMAL HIGH (ref 4.0–10.5)
nRBC: 0 % (ref 0.0–0.2)

## 2019-03-07 LAB — GLUCOSE, CAPILLARY
Glucose-Capillary: 88 mg/dL (ref 70–99)
Glucose-Capillary: 91 mg/dL (ref 70–99)
Glucose-Capillary: 92 mg/dL (ref 70–99)
Glucose-Capillary: 170 mg/dL — ABNORMAL HIGH (ref 70–99)
Glucose-Capillary: 170 mg/dL — ABNORMAL HIGH (ref 70–99)
Glucose-Capillary: 161 mg/dL — ABNORMAL HIGH (ref 70–99)

## 2019-03-07 LAB — MAGNESIUM: Magnesium: 1.9 mg/dL (ref 1.7–2.4)

## 2019-03-07 MED ORDER — ZOLPIDEM TARTRATE 5 MG PO TABS
5.0000 mg | ORAL_TABLET | Freq: Every evening | ORAL | Status: DC | PRN
  Filled 2019-03-07: qty 1

## 2019-03-07 MED ORDER — PANTOPRAZOLE SODIUM 40 MG PO TBEC
40.0000 mg | DELAYED_RELEASE_TABLET | Freq: Every day | ORAL | Status: DC
  Administered 2019-03-07 – 2019-03-09 (×3): 40 mg via ORAL
  Filled 2019-03-07 (×3): qty 1

## 2019-03-07 MED ORDER — FENOFIBRATE 160 MG PO TABS
160.0000 mg | ORAL_TABLET | Freq: Every day | ORAL | Status: DC
  Administered 2019-03-07 – 2019-03-09 (×3): 160 mg via ORAL
  Filled 2019-03-07 (×3): qty 1

## 2019-03-07 NOTE — Progress Notes (Signed)
Physical Therapy Treatment Patient Details Name: Ann Lopez MRN: [id number] DOB: 01-Oct-1970 Today's Date: 03/07/2019    History of Present Illness 48 yo female admitted 03/02/19 with R MCA s/p fall from commode due to L side weakness NIH 12 R gaze preference and L neglect. pt under went R common carotid aterigogram complete revascularixation of R ICA  R MCA and R ACA via emergent mechanical thrombectomy  PMH Breast CA DM asthma depression current smoker gastric bypass 2013 IBS    PT Comments    Pt performed progression to gt training today.  She required L PFRW and ace wrap to LLE to achieve L dorsiflexion.  Pt continues to be motivated to improve and remains an excellent candidate for aggressive therapies at CIR.  Pt seated in recliner post session.  She require mod-max assistance with +2 for safety.     Follow Up Recommendations  CIR     Equipment Recommendations  Other (comment)(TBD)    Recommendations for Other Services Rehab consult     Precautions / Restrictions Precautions Precautions: Fall Restrictions Weight Bearing Restrictions: No    Mobility  Bed Mobility               General bed mobility comments: Pt seated in recliner, min assistance to elevate trunk to sit unsupported from back of chair.  Transfers Overall transfer level: Needs assistance Equipment used: Left platform walker Transfers: Sit to/from Stand Sit to Stand: Max assist;+2 safety/equipment         General transfer comment: Max assistance to achieve standing.  Tech brought [place] closer to patient.  Ambulation/Gait Ambulation/Gait assistance: Mod assist;+2 safety/equipment(for close chair follow.) Gait Distance (Feet): 15 Feet Assistive device: Left platform walker Gait Pattern/deviations: Step-to pattern;Decreased stride length;Decreased dorsiflexion - left;Decreased weight shift to left;Narrow base of support;Trunk flexed     General Gait Details: Constance cues for upright  posture, increased BOS of L to center her self in [place].  L LE ankle ace wrapped into dorsiflexion.   Stairs             Wheelchair Mobility    Modified Rankin (Stroke Patients Only) Modified Rankin (Stroke Patients Only) Pre-Morbid Rankin Score: No symptoms Modified Rankin: Moderately severe disability     Balance Overall balance assessment: Needs assistance Sitting-balance support: No upper extremity supported;Feet unsupported Sitting balance-Leahy Scale: Poor   Postural control: Left lateral lean   Standing balance-Leahy Scale: Poor Standing balance comment: not fully extending hips or knees, head hanging forward, with cues and focus she can correct this.                            Cognition Arousal/Alertness: Awake/alert Behavior During Therapy: Anxious;Impulsive Overall Cognitive Status: Impaired/Different from baseline Area of Impairment: Following commands;Safety/judgement;Problem solving                   Current Attention Level: Sustained   Following Commands: Follows one step commands inconsistently;Follows one step commands with increased time Safety/Judgement: Decreased awareness of safety;Decreased awareness of deficits Awareness: Intellectual Problem Solving: Difficulty sequencing;Requires verbal cues;Requires tactile cues General Comments: Pt eager to improved but remains unaware of her deficits and very poor safety awareness noted.      Exercises      General Comments        Pertinent Vitals/Pain Pain Assessment: No/denies pain Faces Pain Scale: No hurt    Home Living  Prior Function            PT Goals (current goals can now be found in the care plan section) Acute Rehab PT Goals Patient Stated Goal: unable PT Goal Formulation: Patient unable to participate in goal setting Potential to Achieve Goals: Good Progress towards PT goals: Progressing toward goals    Frequency    Min  4X/week      PT Plan Current plan remains appropriate    Co-evaluation              AM-PAC PT "6 Clicks" Mobility   Outcome Measure  Help needed turning from your back to your side while in a flat bed without using bedrails?: Total Help needed moving from lying on your back to sitting on the side of a flat bed without using bedrails?: Total Help needed moving to and from a bed to a chair (including a wheelchair)?: Total Help needed standing up from a chair using your arms (e.g., wheelchair or bedside chair)?: Total Help needed to walk in hospital room?: Total Help needed climbing 3-5 steps with a railing? : Total 6 Click Score: 6    End of Session Equipment Utilized During Treatment: Gait belt Activity Tolerance: Patient tolerated treatment well Patient left: in bed;with nursing/sitter in room Nurse Communication: Mobility status PT Visit Diagnosis: Hemiplegia and hemiparesis Hemiplegia - Right/Left: Left Hemiplegia - dominant/non-dominant: Non-dominant Hemiplegia - caused by: Cerebral infarction     Time: [id number] PT Time Calculation (min) (ACUTE ONLY): 43 min  Charges:  $Gait Training: 8-22 mins $Therapeutic Activity: 23-37 mins                     Erasmo Leventhal , PTA Acute Rehabilitation Services Pager [phone] Office [phone]     [name] 03/07/2019, 12:38 PM

## 2019-03-07 NOTE — Progress Notes (Signed)
STROKE TEAM PROGRESS NOTE   INTERVAL HISTORY Patient is sitting in chair, still has left facial droop and left UE flaccid. LLE 3-/5 some improvement. Pending CIR. Denies heart palpitation.   Vitals:   03/06/19 2030 03/06/19 2336 03/07/19 0430 03/07/19 0738  BP: (!) 92/54 92/60 122/80 123/66  Pulse: 64 (!) 59 (!) 54 (!) 53  Resp:  18 16 18   Temp:  97.7 F (36.5 C) 98 F (36.7 C) 97.9 F (36.6 C)  TempSrc:  Oral  Oral  SpO2:   100% 96%  Weight:      Height:        CBC:  Recent Labs  Lab 03/03/19 1453  03/06/19 0312 03/07/19 0350  WBC 20.0*   < > 12.6* 12.6*  NEUTROABS 15.4*  --   --  9.1*  HGB 12.4   < > 11.5* 12.0  HCT 37.3   < > 34.4* 36.1  MCV 89.4   < > 88.7 87.8  PLT 222   < > 216 238   < > = values in this interval not displayed.    Basic Metabolic Panel:  Recent Labs  Lab 03/04/19 0417 03/04/19 1612  03/06/19 0312 03/07/19 0350  NA 140  --    < > 140 140  K 3.6  --    < > 3.8 3.8  CL 109  --    < > 109 108  CO2 21*  --    < > 23 21*  GLUCOSE 177*  --    < > 97 87  BUN 10  --    < > 6 8  CREATININE 0.59  --    < > 0.49 0.54  CALCIUM 8.7*  --    < > 8.3* 8.8*  MG 1.7 1.6*  --  1.7 1.9  PHOS 2.9 2.9  --   --   --    < > = values in this interval not displayed.   Lipid Panel:     Component Value Date/Time   CHOL 166 03/03/2019 0446   TRIG 515 (H) 03/03/2019 0446   HDL 23 (L) 03/03/2019 0446   CHOLHDL 7.2 03/03/2019 0446   VLDL UNABLE TO CALCULATE IF TRIGLYCERIDE OVER 400 mg/dL 03/03/2019 0446   LDLCALC UNABLE TO CALCULATE IF TRIGLYCERIDE OVER 400 mg/dL 03/03/2019 0446   HgbA1c:  Lab Results  Component Value Date   HGBA1C 9.0 (H) 03/03/2019   Urine Drug Screen:     Component Value Date/Time   LABOPIA POSITIVE (A) 03/02/2019 1240   COCAINSCRNUR NONE DETECTED 03/02/2019 1240   LABBENZ NONE DETECTED 03/02/2019 1240   AMPHETMU POSITIVE (A) 03/02/2019 1240   THCU NONE DETECTED 03/02/2019 1240   LABBARB NONE DETECTED 03/02/2019 1240    Alcohol  Level     Component Value Date/Time   ETH <10 03/02/2019 0823    IMAGING  No results found. Cerebral Angio  01/30/2019 S/P rt COMMON CAROTID ARTERIOGRAM,FOLLOWED BY COMPLETE REVASCULAR IZATION OF OCCLUDED  RT ICA TERMINUS,RT MCA AND RT ACA WITH X PASS WITH 4MMX 40 MM SOLITAIRE X fr RETRIEVER DEVICE AND PENUMBRA ASPIRATION ACHIEVING A tici 2c REVASCULARIZATION . S/P STENT ASSISTED ANGIOPLASTY OF SYMPTOTMATIC PROX RT ICA STENOSIS.Marland Kitchen  EEG 03/02/2019 This study is suggestive of profound diffuse encephalopathy, non specific to etiology but could be secondary to sedation. No seizures or epileptiform discharges were seen throughout the recording.  2D Echocardiogram  1. Left ventricular ejection fraction, by visual estimation, is 60 to 65%. The left ventricle  has normal function. There is no left ventricular hypertrophy.  2. The left ventricle has no regional wall motion abnormalities.  3. Global right ventricle has normal systolic function.The right ventricular size is normal. No increase in right ventricular wall thickness.  4. Left atrial size was normal.  5. Right atrial size was normal.  6. The mitral valve is normal in structure. No evidence of mitral valve regurgitation. No evidence of mitral stenosis.  7. The tricuspid valve is normal in structure. Tricuspid valve regurgitation is not demonstrated.  8. The aortic valve is normal in structure. Aortic valve regurgitation is not visualized. No evidence of aortic valve sclerosis or stenosis.  9. The pulmonic valve was normal in structure. Pulmonic valve regurgitation is not visualized. 10. Normal pulmonary artery systolic pressure. 11. The inferior vena cava is dilated in size with >50% respiratory variability, suggesting right atrial pressure of 8 mmHg.  PHYSICAL EXAM    Temp:  [97.6 F (36.4 C)-99.1 F (37.3 C)] 99.1 F (37.3 C) (11/28 1109) Pulse Rate:  [48-65] 48 (11/28 1109) Resp:  [16-18] 18 (11/28 1109) BP: (85-124)/(50-80)  124/56 (11/28 1109) SpO2:  [96 %-100 %] 96 % (11/28 0738)  General - Well nourished, well developed, in no apparent distress.  Ophthalmologic - fundi not visualized due to noncooperation.  Cardiovascular - Regular rhythm and rate.  Mental Status -  Level of arousal and orientation to time, place, and person were intact. Language including expression, naming, repetition, comprehension was assessed and found intact. Fund of Knowledge was assessed and was intact.  Cranial Nerves II - XII - II - Visual field intact OU. III, IV, VI - Extraocular movements intact, slight right gaze preference, with left gaze not complete. V - Facial sensation intact bilaterally on light touch, but stated left face feeling numb. VII - left facial droop. VIII - Hearing & vestibular intact bilaterally. X - Palate elevates symmetrically. XI - Chin turning & shoulder shrug intact bilaterally. XII - Tongue protrusion intact.  Motor Strength - The patient's strength was normal in right upper and lower extremities, however, left UE flaccid, left LE proximal 3-/5, knee flexion 3/5 and foot DF 3/5.  Bulk was normal and fasciculations were absent.   Motor Tone - Muscle tone was assessed at the neck and appendages and was normal.  Reflexes - The patient's reflexes were 1+ in the left UE and LE and she had left babinski positive.  Sensory - Light touch, temperature/pinprick were assessed and were symmetrical.    Coordination - The patient had normal movements in the right FTN with no ataxia or dysmetria.  Tremor was absent.  Gait and Station - deferred.   ASSESSMENT/PLAN Ms. NICOL USREY is a 48 y.o. female with history of DB, HLD and breast cancer who woke with L sided weakness, R gaze and neuro neglect. CT showed a large penumbra w/ small core and R M1 occlusion taken for mechanical thrombectomy.   Stroke:  R MCA infarct due to right tICA, MCA and ACA occlusion, s/p IR w/ TICI2c reperfusion and R proximal  ICA stent placement - embolic pattern could be due to large vessel source but PAF can not be completely ruled out  CT head No acute abnormality.  CTA head R ICA terminus near occlusion extending into R M1 and R A1. High grade stenoses R M2 branches.  CTA neck R ICA origin/carotid bulb hypodensity w/ high grade stenosis.  CT perfusion positive for penumbra. Mismatch vol 65 mL.  Cerebral Angio  R ICA, R MCA and R AVA occlusion w/ TICI2c revascularizationStent angioplasty R ICA stenosis    MRI  (clausophobia, given Haldol and ativan on call) - cancelled   Repeat CT head 11/25 Residual hyperdensity within the infarcted right lentiform nucleus compatible with resolving confluent petechial hemorrhag, PH2.  2D Echo EF 60-65%. No source of embolus   Recommend 30 day cardiac event monitoring to rule out occult afib as outpt  EEG neg sz  LDL 34.6  TG 515    HgbA1c 9.2  SCDs for VTE prophylaxis  aspirin 81 mg daily prior to admission, now on aspirin 81 mg daily and Brilinta (ticagrelor) 90 mg bid. Continue DAPT for stent.  Therapy recommendations:  CIR   Disposition:  pending   Hypotension, resolved  Home meds:  On lasix, no hx HTN  Off neo  Now on no BP meds  BP goal < 180  BP long term goal normotensive  Hyperlipidemia  Home meds:  tricor 145  LDL 34.6, LDL goal < 70.   TG 515 - resume tricor 160  Continue tricor on discharge  Diabetes type II Uncontrolled  Home meds:  lantus 20    Now on lantus 15  HgbA1c 9.2, goal < 7.0  CBGs  SSI  Close PCP follow up and better DM control  Dysphagia . Secondary to stroke . Speech on board . Cleared for D2 thin  Tobacco abuse  Current smoker  Smoking cessation counseling provided  Pt is willing to quit   Other Stroke Risk Factors  Substance abuse - UDS positive for amphetamines and opiates   Obesity, Body mass index is 32.96 kg/m., recommend weight loss, diet and exercise as appropriate   Migraine.  Seen by Dr. Krista Blue in 11/2014 and put on topamax 50 bid and maxalt prn  Other Active Problems  Breast cancer 2009 s/p surgery and chemo  Post chemo memory deficit. Seen by Dr. Tomi Likens who recommended neuro psych testing. No f/u since 08/2015    1.7cm L thyroid nodule - non emergent [place] recommended for f/u  Anxiety/depression. On Cymbalta  Hypokalemia, resolved 4.0->3.2->3.3->3.6->3.4->3.8->3.8  Leukocytosis WBC 22.4->20.0->15.4->12.6->12.6  Hospital day # 5  Neurology will sign off. Please call with questions. Pt will follow up with stroke clinic NP at [facility] in about 4 weeks. Thanks for the consult.  Rosalin Hawking, MD PhD Stroke Neurology 03/07/2019 2:24 PM  To contact Stroke Continuity provider, please refer to [email or web address] After hours, contact General Neurology

## 2019-03-07 NOTE — Progress Notes (Signed)
PROGRESS NOTE  Ann Lopez [id number] DOB: [birth year] DOA: 03/02/2019 PCP: Hayden Rasmussen, MD  Brief History   This 48 y/o female with PMH as below presented via EMS to the ED 11/23 am with left hemiparesis, left-sided neglect, and right gaze, NIHSS 12.  She was last known normal at 2300 11/22, awoke in am 11/23 and was able to ambulate to the bathroom but then could not stand from the toilet and fell to the ground.  She was taken to IR for emergent R MCA embolectomy.  When she arrived in 4N ICU, she was minimally responsive, exhibited seizure-like activity upon stimulation, and was hypotensive requiring pressor support.  11/23 CT Head w/o contrast> no hemorrhage 11/23 CTA/CTP >There is occlusive or near occlusive thrombus within the right ICA terminus with occlusive/near occlusive thrombus extending into the M1 right middle cerebral artery and A1 right anterior cerebral artery. There is some reconstitution of flow within proximal M2 and more distal right MCA branches. However, there are additional high-grade stenoses within proximal to mid right M2 branches, likely reflecting additional thrombus. 11/23 stat CT brain>>> occlusive thrombus within the right ICA terminus, extending to the M1 right middle cerebral artery and A1 right anterior cerebral artery 11/24 MRI brain>>> 11/23 EEG>>> consistent with profound diffuse encephalopathic nonspecific  Consultants  Hospitalists . PCCM  Procedures  Emergent Thrombectomy achieving a TICI 2C revascularization, along with stent assisted angioplasty of symptomatic proximal right ICA stenosis via right femoral approach 03/02/2019 by Dr. Estanislado Pandy.  Antibiotics   Anti-infectives (From admission, onward)   Start     Dose/Rate Route Frequency Ordered Stop   03/02/19 0853  vancomycin (VANCOCIN) 1-5 GM/200ML-% IVPB    Note to Pharmacy: [name]   : cabinet override      03/02/19 0853 03/02/19 2059     Subjective  The patient is  resting comfortably. She is requesting something to help her sleep. She is refusing Seroquel for sleep.   Objective   Vitals:  Vitals:   03/07/19 1109 03/07/19 1552  BP: (!) 124/56 (!) 132/58  Pulse: (!) 48 (!) 55  Resp: 18 18  Temp: 99.1 F (37.3 C) 99.1 F (37.3 C)  SpO2:  98%   Exam:  Constitutional:  . The patient is awake and alert. No acute distress. Eyes:  . PERRLA . EOMBI . Normal lids and conjunctivae Respiratory:  . No increased work of breathing. . No wheezes, rales, or rhonchi . No tactile fremitus Cardiovascular:  . Regular rate and rhythm . No murmurs, ectopy, or gallups. . No lateral PMI. No thrills. Abdomen:  . Abdomen is soft, non-tender, non-distended . No hernias, masses, or organomegaly . Normoactive bowel sounds.  Musculoskeletal:  . No cyanosis, clubbing, or edema Skin:  . No rashes, lesions, ulcers . palpation of skin: no induration or nodules Psychiatric:  . Mental status o Mood, affect appropriate  I have personally reviewed the following:   Today's Data  . Vitals, CMP, CBC  Scheduled Meds: .  stroke: mapping our early stages of recovery book   Does not apply Once  . aspirin  81 mg Oral Daily   Or  . aspirin  81 mg Per Tube Daily  . chlorhexidine  15 mL Mouth Rinse BID  . Chlorhexidine Gluconate Cloth  6 each Topical Daily  . DULoxetine  60 mg Oral Daily  . fenofibrate  160 mg Oral Daily  . insulin aspart  0-15 Units Subcutaneous Q4H  . insulin glargine  15 Units  Subcutaneous QHS  . mouth rinse  15 mL Mouth Rinse q12n4p  . pantoprazole  40 mg Oral Daily  . QUEtiapine  25 mg Per Tube QHS   And  . QUEtiapine  12.5 mg Per Tube Daily  . ticagrelor  90 mg Oral BID   Or  . ticagrelor  90 mg Per Tube BID   Continuous Infusions: . sodium chloride    . phenylephrine (NEO-SYNEPHRINE) Adult infusion Stopped (03/03/19 1221)    Active Problems:   Stroke (cerebrum) (HCC)   Middle cerebral artery embolism, right   Tachypnea    SIRS (systemic inflammatory response syndrome) (HCC)   Hypokalemia   Diabetes mellitus type 2 in obese (HCC)   Tobacco abuse   History of breast cancer   Cognitive deficits   Dysphagia, post-stroke   LOS: 5 days   A & P  Acute metabolic enecphalopathy after CVA: Due to right M1 occlusion. Now s/p thrombectomy 11/23. R/o seizure: Seizure-like activity with stimulation vs myoclonus. Resolved. The patient's sensorium appears to be improving .Echocardiogram was obtained which demonstrated EF 60-65% No regional wall motion abnormalities. Normal global RV systolic function. Normal diastolic parameters. Systolic blood pressure goal 120-140 per interventional radiology.   Hypotension:  Resolved. Systolic blood pressure goal of 120-140.  Non-anion gap Metabolic acidosis: Resolved  Hypokalemia: Supplemented and monitor.  Hypomagnesemia: Supplemented and monitor.  Hyperlipidemia: Continue home [employer]  Type 2 DM w/ hyperglycemia: A1C 9.2 The patient is receiving Lantus 10 units qhs with correction insulin. Glucoses have been running 88-92.  GERD: Continue Prilosec.  Hx anxiety/depression: Continue home Prozac  I have seen and examined this patient myself. I have spent 30 minutes in her evaluation and care.  Landyn Lorincz, DO [facility] Direct contact: see [email or web address]  7PM-7AM contact night coverage as above 03/07/2019, 4:39 PM  LOS: 4 days

## 2019-03-08 LAB — CBC WITH DIFFERENTIAL/PLATELET
Abs Immature Granulocytes: 0.13 10*3/uL — ABNORMAL HIGH (ref 0.00–0.07)
Basophils Absolute: 0.1 10*3/uL (ref 0.0–0.1)
Basophils Relative: 1 %
Eosinophils Absolute: 0.4 10*3/uL (ref 0.0–0.5)
Eosinophils Relative: 4 %
HCT: 35.3 % — ABNORMAL LOW (ref 36.0–46.0)
Hemoglobin: 12 g/dL (ref 12.0–15.0)
Immature Granulocytes: 1 %
Lymphocytes Relative: 20 %
Lymphs Abs: 2.1 10*3/uL (ref 0.7–4.0)
MCH: 29.5 pg (ref 26.0–34.0)
MCHC: 34 g/dL (ref 30.0–36.0)
MCV: 86.7 fL (ref 80.0–100.0)
Monocytes Absolute: 0.9 10*3/uL (ref 0.1–1.0)
Monocytes Relative: 8 %
Neutro Abs: 7.2 10*3/uL (ref 1.7–7.7)
Neutrophils Relative %: 66 %
Platelets: 278 10*3/uL (ref 150–400)
RBC: 4.07 MIL/uL (ref 3.87–5.11)
RDW: 13.5 % (ref 11.5–15.5)
WBC: 10.8 10*3/uL — ABNORMAL HIGH (ref 4.0–10.5)
nRBC: 0 % (ref 0.0–0.2)

## 2019-03-08 LAB — BASIC METABOLIC PANEL
Anion gap: 10 (ref 5–15)
BUN: 8 mg/dL (ref 6–20)
CO2: 22 mmol/L (ref 22–32)
Calcium: 8.8 mg/dL — ABNORMAL LOW (ref 8.9–10.3)
Chloride: 107 mmol/L (ref 98–111)
Creatinine, Ser: 0.47 mg/dL (ref 0.44–1.00)
GFR calc Af Amer: >60 mL/min (ref >60)
GFR calc non Af Amer: >60 mL/min (ref >60)
Glucose, Bld: 124 mg/dL — ABNORMAL HIGH (ref 70–99)
Potassium: 3.7 mmol/L (ref 3.5–5.1)
Sodium: 139 mmol/L (ref 135–145)

## 2019-03-08 LAB — GLUCOSE, CAPILLARY
Glucose-Capillary: 115 mg/dL — ABNORMAL HIGH (ref 70–99)
Glucose-Capillary: 121 mg/dL — ABNORMAL HIGH (ref 70–99)
Glucose-Capillary: 125 mg/dL — ABNORMAL HIGH (ref 70–99)
Glucose-Capillary: 159 mg/dL — ABNORMAL HIGH (ref 70–99)
Glucose-Capillary: 166 mg/dL — ABNORMAL HIGH (ref 70–99)
Glucose-Capillary: 197 mg/dL — ABNORMAL HIGH (ref 70–99)

## 2019-03-08 MED ORDER — TEMAZEPAM 15 MG PO CAPS: 15.0000 mg | ORAL_CAPSULE | Freq: Every evening | ORAL | Status: DC | PRN

## 2019-03-08 MED ORDER — SODIUM CHLORIDE 0.9 % IV BOLUS
500.0000 mL | Freq: Once | INTRAVENOUS | Status: AC
  Administered 2019-03-08: 500 mL via INTRAVENOUS

## 2019-03-08 NOTE — Progress Notes (Signed)
PROGRESS NOTE  Ann Lopez [id number] DOB: Jul 15, 1970 DOA: 03/02/2019 PCP: Hayden Rasmussen, MD  Brief History   This 48 y/o female with PMH as below presented via EMS to the ED 11/23 am with left hemiparesis, left-sided neglect, and right gaze, NIHSS 12.  She was last known normal at 2300 11/22, awoke in am 11/23 and was able to ambulate to the bathroom but then could not stand from the toilet and fell to the ground.  She was taken to IR for emergent R MCA embolectomy.  When she arrived in 4N ICU, she was minimally responsive, exhibited seizure-like activity upon stimulation, and was hypotensive requiring pressor support.  11/23 CT Head w/o contrast> no hemorrhage 11/23 CTA/CTP >There is occlusive or near occlusive thrombus within the right ICA terminus with occlusive/near occlusive thrombus extending into the M1 right middle cerebral artery and A1 right anterior cerebral artery. There is some reconstitution of flow within proximal M2 and more distal right MCA branches. However, there are additional high-grade stenoses within proximal to mid right M2 branches, likely reflecting additional thrombus. 11/23 stat CT brain>>> occlusive thrombus within the right ICA terminus, extending to the M1 right middle cerebral artery and A1 right anterior cerebral artery 11/24 MRI brain>>> 11/23 EEG>>> consistent with profound diffuse encephalopathic nonspecific  Consultants  Hospitalists . PCCM  Procedures  Emergent Thrombectomy achieving a TICI 2C revascularization, along with stent assisted angioplasty of symptomatic proximal right ICA stenosis via right femoral approach 03/02/2019 by Dr. Estanislado Pandy.  Antibiotics   Anti-infectives (From admission, onward)   Start     Dose/Rate Route Frequency Ordered Stop   03/02/19 0853  vancomycin (VANCOCIN) 1-5 GM/200ML-% IVPB    Note to Pharmacy: [name]   : cabinet override      03/02/19 0853 03/02/19 2059     Subjective  The patient is  sitting up at bedside. No new complaints.  Objective   Vitals:  Vitals:   03/08/19 1233 03/08/19 1620  BP: (!) 101/56 (!) 115/54  Pulse: 64 62  Resp: 18 18  Temp: 98 F (36.7 C) 98.8 F (37.1 C)  SpO2: 100% 96%   Exam:  Constitutional:  . The patient is awake and alert. No acute distress. Eyes:  . PERRLA . EOMBI . Normal lids and conjunctivae Respiratory:  . No increased work of breathing. . No wheezes, rales, or rhonchi . No tactile fremitus Cardiovascular:  . Regular rate and rhythm . No murmurs, ectopy, or gallups. . No lateral PMI. No thrills. Abdomen:  . Abdomen is soft, non-tender, non-distended . No hernias, masses, or organomegaly . Normoactive bowel sounds.  Musculoskeletal:  . No cyanosis, clubbing, or edema Skin:  . No rashes, lesions, ulcers . palpation of skin: no induration or nodules Psychiatric:  . Mental status o Mood, affect appropriate  I have personally reviewed the following:   Today's Data  . Vitals, BMP, CBC, Glucoses  Scheduled Meds: .  stroke: mapping our early stages of recovery book   Does not apply Once  . aspirin  81 mg Oral Daily   Or  . aspirin  81 mg Per Tube Daily  . chlorhexidine  15 mL Mouth Rinse BID  . Chlorhexidine Gluconate Cloth  6 each Topical Daily  . DULoxetine  60 mg Oral Daily  . fenofibrate  160 mg Oral Daily  . insulin aspart  0-15 Units Subcutaneous Q4H  . insulin glargine  15 Units Subcutaneous QHS  . mouth rinse  15 mL Mouth Rinse  q12n4p  . pantoprazole  40 mg Oral Daily  . QUEtiapine  25 mg Per Tube QHS   And  . QUEtiapine  12.5 mg Per Tube Daily  . ticagrelor  90 mg Oral BID   Or  . ticagrelor  90 mg Per Tube BID   Continuous Infusions: . sodium chloride    . phenylephrine (NEO-SYNEPHRINE) Adult infusion Stopped (03/03/19 1221)    Active Problems:   Stroke (cerebrum) (HCC)   Middle cerebral artery embolism, right   Tachypnea   SIRS (systemic inflammatory response syndrome) (HCC)    Hypokalemia   Diabetes mellitus type 2 in obese (HCC)   Tobacco abuse   History of breast cancer   Cognitive deficits   Dysphagia, post-stroke   LOS: 6 days   A & P  Acute metabolic enecphalopathy after CVA: Due to right M1 occlusion. Now s/p thrombectomy 11/23. R/o seizure: Seizure-like activity with stimulation vs myoclonus. Resolved. The patient's sensorium appears to be improving .Echocardiogram was obtained which demonstrated EF 60-65% No regional wall motion abnormalities. Normal global RV systolic function. Normal diastolic parameters. Systolic blood pressure goal 120-140 per interventional radiology.   Hypotension:  Blood pressures remain low. Will give a bolus. Systolic blood pressure goal of 120-140.  Non-anion gap Metabolic acidosis: Resolved  Hypokalemia: Supplemented and monitor.  Hypomagnesemia: Supplemented and monitor.  Hyperlipidemia: Continue home [employer]Cor  Type 2 DM w/ hyperglycemia: A1C 9.2 The patient is receiving Lantus 10 units qhs with correction insulin. Glucoses have been running 115-161.  GERD: Continue Prilosec.  Hx anxiety/depression: Continue home Prozac  I have seen and examined this patient myself. I have spent 32 minutes in her evaluation and care.  Vercie Pokorny, DO [facility] Direct contact: see [email or web address]  7PM-7AM contact night coverage as above 03/08/2019, 5:29 PM  LOS: 4 days

## 2019-03-08 NOTE — Progress Notes (Signed)
Physical Therapy Treatment Patient Details Name: Ann Lopez MRN: [id number] DOB: February 25, 1971 Today's Date: 03/08/2019    History of Present Illness 48 yo female admitted 03/02/19 with R MCA s/p fall from commode due to L side weakness NIH 12 R gaze preference and L neglect. pt under went R common carotid aterigogram complete revascularixation of R ICA  R MCA and R ACA via emergent mechanical thrombectomy  PMH Breast CA DM asthma depression current smoker gastric bypass 2013 IBS    PT Comments    Pt making progress towards mobility goals, continues to need cues to attend to L side and worked on some AAROM exercises that she can perform on her own as she relays feeling very frustrated and wanting to have something to do when she's in the room alone to help her rehab. Mod A +2 for sit<>stand in RW, mod A +2 forn ambulating 18' with assist at L knee to prevent buckling/ hyperextension. PT will continue to follow.     Follow Up Recommendations  CIR     Equipment Recommendations  Other (comment)(TBD)    Recommendations for Other Services Rehab consult     Precautions / Restrictions Precautions Precautions: Fall Required Braces or Orthoses: Other Brace Other Brace: L ankle wrapped with ACE for df assist Restrictions Weight Bearing Restrictions: No    Mobility  Bed Mobility               General bed mobility comments: pt received in recliner after bathing with nursing  Transfers Overall transfer level: Needs assistance Equipment used: Left platform walker Transfers: Sit to/from Stand Sit to Stand: +2 safety/equipment;Mod assist         General transfer comment: mod A +2 for power up and maintaining midline, L knee not blocked with transfer  Ambulation/Gait Ambulation/Gait assistance: Mod assist;+2 physical assistance;+2 safety/equipment Gait Distance (Feet): 18 Feet Assistive device: Left platform walker Gait Pattern/deviations: Step-to pattern;Decreased stride  length;Decreased dorsiflexion - left;Decreased weight shift to left;Narrow base of support;Trunk flexed;Scissoring Gait velocity: decreased Gait velocity interpretation: <1.31 ft/sec, indicative of household ambulator General Gait Details: At first needed facilitation to not scissor with LLE, but then was able to correct with vc's. Support needed at L knee to prevent buckling and hyperextension, needed more support as she fatigued. Needed mod A to guide RW, without assist, veered to R. vc's for stopping frequently to correct posture    Stairs             Wheelchair Mobility    Modified Rankin (Stroke Patients Only) Modified Rankin (Stroke Patients Only) Pre-Morbid Rankin Score: No symptoms Modified Rankin: Moderately severe disability     Balance Overall balance assessment: Needs assistance Sitting-balance support: No upper extremity supported;Feet unsupported Sitting balance-Leahy Scale: Poor   Postural control: Left lateral lean Standing balance support: No upper extremity supported Standing balance-Leahy Scale: Poor Standing balance comment: with vc's to push down through RW with RUE, able to maintain static standing within RW with min A. Mod A +2 needed for dynamics                            Cognition Arousal/Alertness: Awake/alert Behavior During Therapy: Anxious;Impulsive Overall Cognitive Status: Impaired/Different from baseline Area of Impairment: Following commands;Safety/judgement;Problem solving                   Current Attention Level: Sustained   Following Commands: Follows one step commands inconsistently;Follows one step commands with  increased time Safety/Judgement: Decreased awareness of safety;Decreased awareness of deficits Awareness: Intellectual Problem Solving: Difficulty sequencing;Requires verbal cues;Requires tactile cues General Comments: When asked, she can remember to attend to L side, but does not do so without cues.        Exercises General Exercises - Lower Extremity Long Arc Quad: AAROM;Left;10 reps;Seated(RLE for AAROM)    General Comments General comments (skin integrity, edema, etc.): with cues, pt able to remember to ask for LUE to be propped on pillow      Pertinent Vitals/Pain Pain Assessment: No/denies pain Faces Pain Scale: No hurt    Home Living                      Prior Function            PT Goals (current goals can now be found in the care plan section) Acute Rehab PT Goals Patient Stated Goal: get better PT Goal Formulation: Patient unable to participate in goal setting Time For Goal Achievement: 03/18/19 Potential to Achieve Goals: Good Progress towards PT goals: Progressing toward goals    Frequency    Min 4X/week      PT Plan Current plan remains appropriate    Co-evaluation              AM-PAC PT "6 Clicks" Mobility   Outcome Measure  Help needed turning from your back to your side while in a flat bed without using bedrails?: A Lot Help needed moving from lying on your back to sitting on the side of a flat bed without using bedrails?: A Lot Help needed moving to and from a bed to a chair (including a wheelchair)?: A Lot Help needed standing up from a chair using your arms (e.g., wheelchair or bedside chair)?: A Lot Help needed to walk in hospital room?: A Lot Help needed climbing 3-5 steps with a railing? : Total 6 Click Score: 11    End of Session Equipment Utilized During Treatment: Gait belt Activity Tolerance: Patient tolerated treatment well Patient left: in chair;with chair alarm set;with call bell/phone within reach Nurse Communication: Mobility status PT Visit Diagnosis: Hemiplegia and hemiparesis Hemiplegia - Right/Left: Left Hemiplegia - dominant/non-dominant: Non-dominant Hemiplegia - caused by: Cerebral infarction     Time: 0940-1008 PT Time Calculation (min) (ACUTE ONLY): 28 min  Charges:  $Gait Training: 23-37  mins                     Leighton Roach, [place]  Pager [phone] [facility] [place] 03/08/2019, 11:16 AM

## 2019-03-09 ENCOUNTER — Encounter (HOSPITAL_COMMUNITY): Payer: Self-pay | Admitting: *Deleted

## 2019-03-09 ENCOUNTER — Other Ambulatory Visit: Payer: Self-pay

## 2019-03-09 ENCOUNTER — Inpatient Hospital Stay (HOSPITAL_COMMUNITY)
Admission: RE | Admit: 2019-03-09 | Discharge: 2019-03-31 | DRG: 057 | Disposition: A | Discharge status: Home Health | Payer: Medicare Other | Source: Intra-hospital | Attending: Physical Medicine & Rehabilitation | Admitting: Physical Medicine & Rehabilitation

## 2019-03-09 ENCOUNTER — Inpatient Hospital Stay (HOSPITAL_COMMUNITY): Payer: Medicare Other

## 2019-03-09 DIAGNOSIS — Z9884 Bariatric surgery status: Secondary | ICD-10-CM

## 2019-03-09 DIAGNOSIS — Z9221 Personal history of antineoplastic chemotherapy: Secondary | ICD-10-CM | POA: Diagnosis not present

## 2019-03-09 DIAGNOSIS — Z803 Family history of malignant neoplasm of breast: Secondary | ICD-10-CM

## 2019-03-09 DIAGNOSIS — F4001 Agoraphobia with panic disorder: Secondary | ICD-10-CM | POA: Diagnosis present

## 2019-03-09 DIAGNOSIS — E1165 Type 2 diabetes mellitus with hyperglycemia: Secondary | ICD-10-CM | POA: Diagnosis not present

## 2019-03-09 DIAGNOSIS — I69392 Facial weakness following cerebral infarction: Secondary | ICD-10-CM | POA: Diagnosis not present

## 2019-03-09 DIAGNOSIS — Z888 Allergy status to other drugs, medicaments and biological substances status: Secondary | ICD-10-CM

## 2019-03-09 DIAGNOSIS — R414 Neurologic neglect syndrome: Secondary | ICD-10-CM | POA: Diagnosis not present

## 2019-03-09 DIAGNOSIS — E119 Type 2 diabetes mellitus without complications: Secondary | ICD-10-CM | POA: Diagnosis not present

## 2019-03-09 DIAGNOSIS — E1169 Type 2 diabetes mellitus with other specified complication: Secondary | ICD-10-CM

## 2019-03-09 DIAGNOSIS — G4700 Insomnia, unspecified: Secondary | ICD-10-CM | POA: Diagnosis present

## 2019-03-09 DIAGNOSIS — I69391 Dysphagia following cerebral infarction: Secondary | ICD-10-CM

## 2019-03-09 DIAGNOSIS — K5909 Other constipation: Secondary | ICD-10-CM | POA: Diagnosis present

## 2019-03-09 DIAGNOSIS — E041 Nontoxic single thyroid nodule: Secondary | ICD-10-CM | POA: Diagnosis present

## 2019-03-09 DIAGNOSIS — F17210 Nicotine dependence, cigarettes, uncomplicated: Secondary | ICD-10-CM | POA: Diagnosis present

## 2019-03-09 DIAGNOSIS — I959 Hypotension, unspecified: Secondary | ICD-10-CM | POA: Diagnosis not present

## 2019-03-09 DIAGNOSIS — I63511 Cerebral infarction due to unspecified occlusion or stenosis of right middle cerebral artery: Secondary | ICD-10-CM | POA: Diagnosis present

## 2019-03-09 DIAGNOSIS — Z7982 Long term (current) use of aspirin: Secondary | ICD-10-CM | POA: Diagnosis not present

## 2019-03-09 DIAGNOSIS — Z818 Family history of other mental and behavioral disorders: Secondary | ICD-10-CM | POA: Diagnosis not present

## 2019-03-09 DIAGNOSIS — Z880 Allergy status to penicillin: Secondary | ICD-10-CM

## 2019-03-09 DIAGNOSIS — J449 Chronic obstructive pulmonary disease, unspecified: Secondary | ICD-10-CM | POA: Diagnosis present

## 2019-03-09 DIAGNOSIS — R001 Bradycardia, unspecified: Secondary | ICD-10-CM

## 2019-03-09 DIAGNOSIS — F418 Other specified anxiety disorders: Secondary | ICD-10-CM | POA: Diagnosis present

## 2019-03-09 DIAGNOSIS — E669 Obesity, unspecified: Secondary | ICD-10-CM

## 2019-03-09 DIAGNOSIS — Z882 Allergy status to sulfonamides status: Secondary | ICD-10-CM

## 2019-03-09 DIAGNOSIS — G8194 Hemiplegia, unspecified affecting left nondominant side: Secondary | ICD-10-CM

## 2019-03-09 DIAGNOSIS — R7309 Other abnormal glucose: Secondary | ICD-10-CM | POA: Diagnosis not present

## 2019-03-09 DIAGNOSIS — F331 Major depressive disorder, recurrent, moderate: Secondary | ICD-10-CM | POA: Diagnosis present

## 2019-03-09 DIAGNOSIS — Z885 Allergy status to narcotic agent status: Secondary | ICD-10-CM

## 2019-03-09 DIAGNOSIS — F5105 Insomnia due to other mental disorder: Secondary | ICD-10-CM | POA: Diagnosis not present

## 2019-03-09 DIAGNOSIS — G8929 Other chronic pain: Secondary | ICD-10-CM

## 2019-03-09 DIAGNOSIS — R45851 Suicidal ideations: Secondary | ICD-10-CM | POA: Diagnosis present

## 2019-03-09 DIAGNOSIS — Z794 Long term (current) use of insulin: Secondary | ICD-10-CM

## 2019-03-09 DIAGNOSIS — Z881 Allergy status to other antibiotic agents status: Secondary | ICD-10-CM

## 2019-03-09 DIAGNOSIS — M7989 Other specified soft tissue disorders: Secondary | ICD-10-CM

## 2019-03-09 DIAGNOSIS — G3184 Mild cognitive impairment, so stated: Secondary | ICD-10-CM | POA: Diagnosis not present

## 2019-03-09 DIAGNOSIS — Z9013 Acquired absence of bilateral breasts and nipples: Secondary | ICD-10-CM | POA: Diagnosis not present

## 2019-03-09 DIAGNOSIS — I10 Essential (primary) hypertension: Secondary | ICD-10-CM | POA: Diagnosis present

## 2019-03-09 DIAGNOSIS — I639 Cerebral infarction, unspecified: Secondary | ICD-10-CM

## 2019-03-09 DIAGNOSIS — I69319 Unspecified symptoms and signs involving cognitive functions following cerebral infarction: Secondary | ICD-10-CM | POA: Diagnosis not present

## 2019-03-09 DIAGNOSIS — R29810 Facial weakness: Secondary | ICD-10-CM | POA: Diagnosis present

## 2019-03-09 DIAGNOSIS — G43909 Migraine, unspecified, not intractable, without status migrainosus: Secondary | ICD-10-CM | POA: Diagnosis present

## 2019-03-09 DIAGNOSIS — I69354 Hemiplegia and hemiparesis following cerebral infarction affecting left non-dominant side: Secondary | ICD-10-CM | POA: Diagnosis not present

## 2019-03-09 DIAGNOSIS — G441 Vascular headache, not elsewhere classified: Secondary | ICD-10-CM

## 2019-03-09 DIAGNOSIS — Z853 Personal history of malignant neoplasm of breast: Secondary | ICD-10-CM | POA: Diagnosis not present

## 2019-03-09 DIAGNOSIS — R1311 Dysphagia, oral phase: Secondary | ICD-10-CM | POA: Diagnosis not present

## 2019-03-09 DIAGNOSIS — M25519 Pain in unspecified shoulder: Secondary | ICD-10-CM | POA: Diagnosis present

## 2019-03-09 LAB — GLUCOSE, CAPILLARY
Glucose-Capillary: 113 mg/dL — ABNORMAL HIGH (ref 70–99)
Glucose-Capillary: 159 mg/dL — ABNORMAL HIGH (ref 70–99)
Glucose-Capillary: 134 mg/dL — ABNORMAL HIGH (ref 70–99)
Glucose-Capillary: 243 mg/dL — ABNORMAL HIGH (ref 70–99)
Glucose-Capillary: 138 mg/dL — ABNORMAL HIGH (ref 70–99)

## 2019-03-09 MED ORDER — SENNOSIDES-DOCUSATE SODIUM 8.6-50 MG PO TABS: 1.0000 | ORAL_TABLET | Freq: Two times a day (BID) | ORAL | Status: DC

## 2019-03-09 MED ORDER — ASPIRIN EC 81 MG PO TBEC
81.0000 mg | DELAYED_RELEASE_TABLET | Freq: Every day | ORAL | Status: DC
  Administered 2019-03-10 – 2019-03-31 (×22): 81 mg via ORAL
  Filled 2019-03-09 (×22): qty 1

## 2019-03-09 MED ORDER — TICAGRELOR 90 MG PO TABS
90.0000 mg | ORAL_TABLET | Freq: Two times a day (BID) | ORAL | Status: DC
  Administered 2019-03-09 – 2019-03-31 (×44): 90 mg via ORAL
  Filled 2019-03-09 (×43): qty 1

## 2019-03-09 MED ORDER — FENOFIBRATE 160 MG PO TABS
160.0000 mg | ORAL_TABLET | Freq: Every day | ORAL | Status: DC
  Administered 2019-03-10 – 2019-03-31 (×22): 160 mg via ORAL
  Filled 2019-03-09 (×23): qty 1

## 2019-03-09 MED ORDER — PANTOPRAZOLE SODIUM 40 MG PO TBEC: 40.0000 mg | DELAYED_RELEASE_TABLET | Freq: Every day | ORAL | 0 refills | Status: DC

## 2019-03-09 MED ORDER — HYDROCODONE-ACETAMINOPHEN 5-325 MG PO TABS
1.0000 | ORAL_TABLET | Freq: Four times a day (QID) | ORAL | Status: DC | PRN
  Administered 2019-03-09 – 2019-03-31 (×37): 1 via ORAL
  Filled 2019-03-09 (×38): qty 1

## 2019-03-09 MED ORDER — SODIUM CHLORIDE 0.9 % IV SOLN
INTRAVENOUS | Status: DC
  Administered 2019-03-09: 09:00:00 via INTRAVENOUS

## 2019-03-09 MED ORDER — INSULIN GLARGINE 100 UNIT/ML ~~LOC~~ SOLN
15.0000 [IU] | Freq: Every day | SUBCUTANEOUS | Status: DC
  Administered 2019-03-09 – 2019-03-30 (×22): 15 [IU] via SUBCUTANEOUS
  Filled 2019-03-09 (×23): qty 0.15

## 2019-03-09 MED ORDER — ALUM & MAG HYDROXIDE-SIMETH 200-200-20 MG/5ML PO SUSP: 30.0000 mL | ORAL | Status: DC | PRN

## 2019-03-09 MED ORDER — QUETIAPINE FUMARATE 25 MG PO TABS: 12.5000 mg | ORAL_TABLET | Freq: Every day | ORAL | 0 refills | Status: DC

## 2019-03-09 MED ORDER — CANAGLIFLOZIN 300 MG PO TABS
300.0000 mg | ORAL_TABLET | Freq: Every day | ORAL | Status: DC
  Administered 2019-03-10 – 2019-03-31 (×22): 300 mg via ORAL
  Filled 2019-03-09 (×22): qty 1

## 2019-03-09 MED ORDER — TEMAZEPAM 15 MG PO CAPS: 15.0000 mg | ORAL_CAPSULE | Freq: Every evening | ORAL | 0 refills | Status: DC | PRN

## 2019-03-09 MED ORDER — INSULIN GLARGINE 100 UNIT/ML ~~LOC~~ SOLN: 15.0000 [IU] | Freq: Every day | SUBCUTANEOUS | 11 refills | Status: DC

## 2019-03-09 MED ORDER — SENNOSIDES-DOCUSATE SODIUM 8.6-50 MG PO TABS: 1.0000 | ORAL_TABLET | Freq: Two times a day (BID) | ORAL | 0 refills | Status: DC

## 2019-03-09 MED ORDER — BISACODYL 10 MG RE SUPP
10.0000 mg | Freq: Every day | RECTAL | Status: DC | PRN
  Administered 2019-03-24 – 2019-03-27 (×2): 10 mg via RECTAL
  Filled 2019-03-09 (×2): qty 1

## 2019-03-09 MED ORDER — PROCHLORPERAZINE EDISYLATE 10 MG/2ML IJ SOLN: 5.0000 mg | Freq: Four times a day (QID) | INTRAMUSCULAR | Status: DC | PRN

## 2019-03-09 MED ORDER — TICAGRELOR 90 MG PO TABS
90.0000 mg | ORAL_TABLET | Freq: Two times a day (BID) | ORAL | Status: DC
  Filled 2019-03-09 (×20): qty 1

## 2019-03-09 MED ORDER — ENOXAPARIN SODIUM 40 MG/0.4ML ~~LOC~~ SOLN
40.0000 mg | SUBCUTANEOUS | Status: DC
  Administered 2019-03-09 – 2019-03-30 (×22): 40 mg via SUBCUTANEOUS
  Filled 2019-03-09 (×22): qty 0.4

## 2019-03-09 MED ORDER — GUAIFENESIN-DM 100-10 MG/5ML PO SYRP
5.0000 mL | ORAL_SOLUTION | Freq: Four times a day (QID) | ORAL | Status: DC | PRN
  Filled 2019-03-09: qty 10

## 2019-03-09 MED ORDER — FLEET ENEMA 7-19 GM/118ML RE ENEM: 1.0000 | ENEMA | Freq: Once | RECTAL | Status: DC | PRN

## 2019-03-09 MED ORDER — DULOXETINE HCL 60 MG PO CPEP
60.0000 mg | ORAL_CAPSULE | Freq: Every day | ORAL | Status: DC
  Administered 2019-03-10 – 2019-03-31 (×22): 60 mg via ORAL
  Filled 2019-03-09 (×22): qty 1

## 2019-03-09 MED ORDER — SENNOSIDES-DOCUSATE SODIUM 8.6-50 MG PO TABS
2.0000 | ORAL_TABLET | Freq: Every day | ORAL | Status: DC
  Administered 2019-03-10 – 2019-03-11 (×2): 2 via ORAL
  Filled 2019-03-09 (×2): qty 2

## 2019-03-09 MED ORDER — INSULIN ASPART 100 UNIT/ML ~~LOC~~ SOLN: 0.0000 [IU] | Freq: Every day | SUBCUTANEOUS | Status: DC

## 2019-03-09 MED ORDER — PROCHLORPERAZINE MALEATE 5 MG PO TABS
5.0000 mg | ORAL_TABLET | Freq: Four times a day (QID) | ORAL | Status: DC | PRN
  Administered 2019-03-15: 5 mg via ORAL
  Administered 2019-03-16 – 2019-03-19 (×2): 10 mg via ORAL
  Filled 2019-03-09 (×3): qty 2

## 2019-03-09 MED ORDER — DIPHENHYDRAMINE HCL 12.5 MG/5ML PO ELIX: 12.5000 mg | ORAL_SOLUTION | Freq: Four times a day (QID) | ORAL | Status: DC | PRN

## 2019-03-09 MED ORDER — POLYETHYLENE GLYCOL 3350 17 G PO PACK
17.0000 g | PACK | Freq: Every day | ORAL | Status: DC | PRN
  Administered 2019-03-09 – 2019-03-18 (×3): 17 g via ORAL
  Filled 2019-03-09 (×6): qty 1

## 2019-03-09 MED ORDER — ACETAMINOPHEN 325 MG PO TABS
325.0000 mg | ORAL_TABLET | ORAL | Status: DC | PRN
  Administered 2019-03-13 – 2019-03-20 (×2): 650 mg via ORAL
  Filled 2019-03-09 (×2): qty 2

## 2019-03-09 MED ORDER — QUETIAPINE FUMARATE 25 MG PO TABS: 25.0000 mg | ORAL_TABLET | Freq: Every day | ORAL | 0 refills | Status: DC

## 2019-03-09 MED ORDER — TICAGRELOR 90 MG PO TABS: 90.0000 mg | ORAL_TABLET | Freq: Two times a day (BID) | ORAL | 2 refills | Status: DC

## 2019-03-09 MED ORDER — CLONAZEPAM 0.25 MG PO TBDP
0.2500 mg | ORAL_TABLET | Freq: Two times a day (BID) | ORAL | Status: DC | PRN
  Administered 2019-03-17 – 2019-03-29 (×11): 0.25 mg via ORAL
  Filled 2019-03-09 (×12): qty 1

## 2019-03-09 MED ORDER — PROCHLORPERAZINE 25 MG RE SUPP: 12.5000 mg | Freq: Four times a day (QID) | RECTAL | Status: DC | PRN

## 2019-03-09 MED ORDER — TRAZODONE HCL 50 MG PO TABS: 25.0000 mg | ORAL_TABLET | Freq: Every evening | ORAL | Status: DC | PRN

## 2019-03-09 MED ORDER — PANTOPRAZOLE SODIUM 40 MG PO TBEC
40.0000 mg | DELAYED_RELEASE_TABLET | Freq: Every day | ORAL | Status: DC
  Administered 2019-03-10 – 2019-03-31 (×22): 40 mg via ORAL
  Filled 2019-03-09 (×22): qty 1

## 2019-03-09 MED ORDER — INSULIN ASPART 100 UNIT/ML ~~LOC~~ SOLN
0.0000 [IU] | Freq: Three times a day (TID) | SUBCUTANEOUS | Status: DC
  Administered 2019-03-09: 18:00:00 3 [IU] via SUBCUTANEOUS
  Administered 2019-03-10 – 2019-03-14 (×4): 1 [IU] via SUBCUTANEOUS
  Administered 2019-03-17: 2 [IU] via SUBCUTANEOUS
  Administered 2019-03-19: 3 [IU] via SUBCUTANEOUS
  Administered 2019-03-21: 2 [IU] via SUBCUTANEOUS
  Administered 2019-03-22 – 2019-03-23 (×2): 1 [IU] via SUBCUTANEOUS
  Administered 2019-03-24: 2 [IU] via SUBCUTANEOUS
  Administered 2019-03-26 – 2019-03-27 (×2): 1 [IU] via SUBCUTANEOUS
  Administered 2019-03-27: 2 [IU] via SUBCUTANEOUS
  Administered 2019-03-28: 1 [IU] via SUBCUTANEOUS
  Administered 2019-03-28 – 2019-03-29 (×2): 2 [IU] via SUBCUTANEOUS

## 2019-03-09 MED ORDER — ALBUTEROL SULFATE (2.5 MG/3ML) 0.083% IN NEBU: 2.5000 mg | INHALATION_SOLUTION | Freq: Four times a day (QID) | RESPIRATORY_TRACT | Status: DC | PRN

## 2019-03-09 NOTE — Progress Notes (Signed)
Nutrition Follow-up  DOCUMENTATION CODES:   Obesity unspecified  INTERVENTION:  Provide Magic cup TID with meals, each supplement provides 290 kcal and 9 grams of protein.  Encourage adequate PO intake.   NUTRITION DIAGNOSIS:   Inadequate oral intake related to dysphagia, lethargy/confusion as evidenced by NPO status; diet advanced; improved  GOAL:   Patient will meet greater than or equal to 90% of their needs; progressing  MONITOR:   PO intake, Supplement acceptance, Diet advancement, Skin, Weight trends, Labs, I & O's  REASON FOR ASSESSMENT:   Consult Enteral/tube feeding initiation and management  ASSESSMENT:   48 yo female admitted with left-sided weakness, cerebral infarction due to embolism of right middle cerebral artery. S/P emergent angiogram with revascularization on admission. PMH includes DM, breast cancer, smoker, osteopenia, osteoporosis, HLD.  Diet advanced 11/25. NG removed 11/26. Tube feeding discontinued. Pt is currently on a dysphagia 2 diet with thin liquids. Meal completion has been 75-100%. Pt reports having a good appetite and has been eating well. Plans to discharge to CIR today. RD to order Magic cup on meal trays to aid in adequate nutrition.    Labs and medications reviewed.   Diet Order:   Diet Order            Diet - low sodium heart healthy        DIET DYS 2 Room service appropriate? Yes; Fluid consistency: Thin  Diet effective now              EDUCATION NEEDS:   Not appropriate for education at this time  Skin:  Skin Assessment: Reviewed RN Assessment Skin Integrity Issues:: Incisions Incisions: R groin  Last BM:  11/28  Height:   Ht Readings from Last 1 Encounters:  03/02/19 5\' 4"  (1.626 m)    Weight:   Wt Readings from Last 1 Encounters:  03/05/19 87.1 kg    Ideal Body Weight:  54.5 kg  BMI:  Body mass index is 32.96 kg/m.  Estimated Nutritional Needs:   Kcal:  1700-1900  Protein:  85-95 grams  Fluid:   >/= 1.7 L/day    Corrin Parker, MS, RD, LDN Pager # [phone] After hours/ weekend pager # [phone]

## 2019-03-09 NOTE — Progress Notes (Signed)
[name], MD  Physician  Physical Medicine and Rehabilitation  Consult Note  Signed  Date of Service:  03/04/2019 12:15 PM      Related encounter: ED to Hosp-Admission (Discharged) from 03/02/2019 in [facility]      Signed      Expand All Collapse All            Physical Medicine and Rehabilitation Consult     Reason for Consult: Stroke with functional deficits.  Referring Physician: Dr.      Harrison Lopez: Ann Lopez is a 48 y.o. female with history of T2DM, HTN, breast cancer s/p bilateral mastectomy, post chemo memory deficits,  back pain presented on 03/02/2019 after a fall from who was admitted 03/02/19 after a fall from the commode and noted to have left-sided weakness, neglect, and right gaze preference.  History taken from chart review and nursing due to lethargy.  UDS was positive for opioids and amphetamines.  CTA/P head done revealing near occlusive thrombus within right ICA terminus extending into M1 R-MCA and A1 R-ACA  with core infarct R-MCA and large penumbra. She was taken for cerbral angio with emergent thrombectomy and complete revascularization of occluded R-ICA terminus, R-MCA and R-ACA with stent assisted angioplasty of symptomatic proximal R-ICA stenosis by Dr. Estanislado Pandy.  Post procedure with hypotension felt to be due to carotid manipulation. She did have decrease in LOC as well as increase in tone LLE post procedure with concerns of seizure.  EEG performed, showing diffuse encephalopathy.  Repeat head CT ordered, personally reviewed, stable/improved.  Echocardiogram showing ejection fraction of 60 to 65% with no wall motion abnormalities   She tolerated extubation without difficulty but has had issues bouts of agitation with lethargy.  She was started on dysphagia to thin liquids when awake as lethargy increases aspiration risk. Therapy evaluation completed today and patient limited by waxing/waning of MS limiting cognitive evaluation,  significant facial weakness with increase WOB,  right gaze preference with left neglect, LLE weakness.  Hospital course further complicated by tachypnea, fevers, leukocytosis, hypokalemia. CIR recommended due to functional deficits.    Review of Systems  Unable to perform ROS: Mental acuity        Past Medical History:  Diagnosis Date   Allergy     Anxiety     Asthma     Back pain     Breast cancer ([place])     Cancer ([place])     Depression     Diabetes mellitus     Headache     Hyperlipemia     Osteopenia     Osteoporosis      osteopenia           Past Surgical History:  Procedure Laterality Date   ABDOMINAL HYSTERECTOMY        B oophorectomy for BRCA1 gene   BREAST SURGERY       CESAREAN SECTION       INCISION AND DRAINAGE ABSCESS Left 11/14/2012    Procedure: INCISION AND DRAINAGE ABSCESS LEFT GROIN;  Surgeon: Jamesetta So, MD;  Location: [facility];  Service: General;  Laterality: Left;   LYMPHADENECTOMY       MASTECTOMY Bilateral     RADIOLOGY WITH ANESTHESIA N/A 03/02/2019    Procedure: IR WITH ANESTHESIA;  Surgeon: Luanne Bras, MD;  Location: Center;  Service: Radiology;  Laterality: N/A;           Family History  Problem Relation Age of Onset  Cancer Mother 65        breast cancer   Hypertension Sister     Hypothyroidism Sister     Cancer Sister     Hypothyroidism Brother     Cancer Maternal Grandmother 33        breast cancer   Cancer Maternal Grandfather 70        pancreatic cancer      Social History:  Per reports she has been smoking cigarettes. She has been smoking about 0.50 packs per day. She has never used smokeless tobacco. Per reports she does not drink alcohol or use drugs.           Allergies  Allergen Reactions   Azithromycin Other (See Comments)      Unknown reaction   Morphine And Related Other (See Comments)      Unknown reaction - Can tolerate hydrocodone and dilaudid without side effects    Nitrofurantoin Other (See Comments)      Unknown reaction   Penicillins Hives and Itching      Has patient had a PCN reaction causing immediate rash, facial/tongue/throat swelling, SOB or lightheadedness with hypotension: Yes Has patient had a PCN reaction causing severe rash involving mucus membranes or skin necrosis: No Has patient had a PCN reaction that required hospitalization No Has patient had a PCN reaction occurring within the last 10 years: No If all of the above answers are "NO", then may proceed with Cephalosporin use.     Sulfonamide Derivatives Hives            Medications Prior to Admission  Medication Sig Dispense Refill   HYDROcodone-acetaminophen (NORCO) 10-325 MG tablet Take 1 tablet by mouth every 6 (six) hours as needed.       hydrOXYzine (ATARAX/VISTARIL) 10 MG tablet Take 10 mg by mouth 3 (three) times daily.       albuterol (PROVENTIL HFA;VENTOLIN HFA) 108 (90 Base) MCG/ACT inhaler Inhale 1 puff into the lungs every 6 (six) hours as needed for wheezing or shortness of breath. 1 Inhaler 6   albuterol (PROVENTIL) (2.5 MG/3ML) 0.083% nebulizer solution Take 3 mLs (2.5 mg total) by nebulization every 6 (six) hours as needed for wheezing or shortness of breath. 75 mL 12   aspirin EC 81 MG tablet Take 1 tablet (81 mg total) by mouth daily. 30 tablet 0   canagliflozin (INVOKANA) 300 MG TABS tablet Take 300 mg by mouth daily before breakfast.       Choline Fenofibrate (FENOFIBRIC ACID) 135 MG CPDR Take 1 capsule by mouth daily.       clonazePAM (KLONOPIN) 0.5 MG tablet Take 0.25 mg by mouth 2 (two) times daily as needed for anxiety.        divalproex (DEPAKOTE ER) 250 MG 24 hr tablet Take 250 mg by mouth 2 (two) times daily.       DULoxetine (CYMBALTA) 60 MG capsule Take 2 capsules (120 mg total) by mouth daily. (Patient taking differently: Take 60 mg by mouth daily. ) 180 capsule 3   fenofibrate (TRICOR) 145 MG tablet Take 1 tablet (145 mg total) by mouth daily.  90 tablet 1   furosemide (LASIX) 20 MG tablet Take 20 mg by mouth.       glucose blood (CHOICE DM FORA G20 TEST STRIPS) test strip Use as instructed 100 each 12   ibuprofen (ADVIL,MOTRIN) 200 MG tablet Take 800 mg by mouth every 6 (six) hours as needed for fever. Reported on 07/28/2015  Insulin Glargine (LANTUS SOLOSTAR) 100 UNIT/ML Solostar Pen Inject 20 Units into the skin daily at 10 pm. 5 pen PRN   Insulin Pen Needle 32G X 4 MM MISC 1 application by Does not apply route daily. 100 each 6   Lancets (ONETOUCH ULTRASOFT) lancets Use as instructed; check sugar once daily 100 each 12   mometasone (NASONEX) 50 MCG/ACT nasal spray Place 2 sprays into the nose daily. 17 g 12   naproxen (NAPROSYN) 500 MG tablet Take 1 tablet (500 mg total) by mouth 2 (two) times daily. 20 tablet 0   omeprazole (PRILOSEC) 40 MG capsule Take 1 capsule (40 mg total) by mouth daily. 30 capsule 5      Home: Home Living Family/patient expects to be discharged to:: Private residence Living Arrangements: Spouse/significant other, Children Additional Comments: information per chart due to pt too lethargic to address  Functional History: Prior Function Level of Independence: Independent Functional Status:  Mobility: Bed Mobility Overal bed mobility: Needs Assistance Bed Mobility: Rolling, Sidelying to Sit, Sit to Sidelying Rolling: Total assist, +2 for safety/equipment, +2 for physical assistance Sidelying to sit: Total assist, +2 for physical assistance, +2 for safety/equipment Sit to sidelying: Total assist, +2 for physical assistance, +2 for safety/equipment General bed mobility comments: arousable, but very sleepy Transfers Overall transfer level: Needs assistance Transfers: Sit to/from Stand Sit to Stand: Mod assist, +2 physical assistance, +2 safety/equipment, From elevated surface(from ICU bed) General transfer comment: pt initiated coming forward as if to stand; once therapists in position  (blocking each knee) pt able to come to near fully standing with left knee giving, but not flacid/buckling   ADL:   Cognition: Cognition Overall Cognitive Status: Difficult to assess Orientation Level: Oriented X4 Cognition Arousal/Alertness: Lethargic Behavior During Therapy: Flat affect(progressed to restless) Overall Cognitive Status: Difficult to assess Area of Impairment: Attention, Following commands, Awareness Current Attention Level: Sustained Following Commands: Follows one step commands inconsistently, Follows one step commands with increased time Awareness: Intellectual General Comments: pt required max verbal and tactile stimulation to stay awake (even at EOB) with frequent eye-closing while speaking Difficult to assess due to: Level of arousal     Blood pressure (!) 178/83, pulse (!) 56, temperature (!) 101.7 F (38.7 C), temperature source Axillary, resp. rate (!) 24, height _0  (1.626 m), weight 83.3 kg, SpO2 97 %. Physical Exam  Vitals reviewed. Constitutional: She appears well-developed.  Obese Patient recently received Precedex per nursing  HENT:  Head: Normocephalic and atraumatic.  + NG  Eyes: Right eye exhibits no discharge. Left eye exhibits no discharge.  Keeps eyes closed  Neck: No tracheal deviation present. No thyromegaly present.  Respiratory: Effort normal. No respiratory distress.  GI: She exhibits no distension.  Musculoskeletal:     Comments: Right hand edema and tenderness  Neurological:  Extremely lethargic, difficult to arouse (recently received medications) Some movements noted on right side to painful stimuli, no movements noted on left  Skin:  Right hand with erythema  Psychiatric:  Unable to assess due to lethargy      Lab Results Last 24 Hours       Results for orders placed or performed during the hospital encounter of 03/02/19 (from the past 24 hour(s))  Hemoglobin A1c     Status: Abnormal    Collection Time: 03/03/19  2:46  PM  Result Value Ref Range    Hgb A1c MFr Bld 9.0 (H) 4.8 - 5.6 %    Mean Plasma Glucose 211.6 mg/dL  CBC with Differential/Platelet     Status: Abnormal    Collection Time: 03/03/19  2:53 PM  Result Value Ref Range    WBC 20.0 (H) 4.0 - 10.5 K/uL    RBC 4.17 3.87 - 5.11 MIL/uL    Hemoglobin 12.4 12.0 - 15.0 g/dL    HCT 37.3 36.0 - 46.0 %    MCV 89.4 80.0 - 100.0 fL    MCH 29.7 26.0 - 34.0 pg    MCHC 33.2 30.0 - 36.0 g/dL    RDW 14.3 11.5 - 15.5 %    Platelets 222 150 - 400 K/uL    nRBC 0.0 0.0 - 0.2 %    Neutrophils Relative % 77 %    Neutro Abs 15.4 (H) 1.7 - 7.7 K/uL    Lymphocytes Relative 15 %    Lymphs Abs 3.1 0.7 - 4.0 K/uL    Monocytes Relative 7 %    Monocytes Absolute 1.4 (H) 0.1 - 1.0 K/uL    Eosinophils Relative 0 %    Eosinophils Absolute 0.0 0.0 - 0.5 K/uL    Basophils Relative 0 %    Basophils Absolute 0.1 0.0 - 0.1 K/uL    Immature Granulocytes 1 %    Abs Immature Granulocytes 0.11 (H) 0.00 - 0.07 K/uL  Basic metabolic panel     Status: Abnormal    Collection Time: 03/03/19  2:53 PM  Result Value Ref Range    Sodium 141 135 - 145 mmol/L    Potassium 3.3 (L) 3.5 - 5.1 mmol/L    Chloride 113 (H) 98 - 111 mmol/L    CO2 22 22 - 32 mmol/L    Glucose, Bld 141 (H) 70 - 99 mg/dL    BUN 8 6 - 20 mg/dL    Creatinine, Ser 0.61 0.44 - 1.00 mg/dL    Calcium 8.3 (L) 8.9 - 10.3 mg/dL    GFR calc non Af Amer >60 >60 mL/min    GFR calc Af Amer >60 >60 mL/min    Anion gap 6 5 - 15  Magnesium     Status: Abnormal    Collection Time: 03/03/19  2:53 PM  Result Value Ref Range    Magnesium 1.6 (L) 1.7 - 2.4 mg/dL  Phosphorus     Status: None    Collection Time: 03/03/19  2:53 PM  Result Value Ref Range    Phosphorus 2.8 2.5 - 4.6 mg/dL  Glucose, capillary     Status: Abnormal    Collection Time: 03/03/19  3:25 PM  Result Value Ref Range    Glucose-Capillary 143 (H) 70 - 99 mg/dL    Comment 1 Notify RN      Comment 2 Document in Chart    Glucose, capillary      Status: Abnormal    Collection Time: 03/03/19  7:48 PM  Result Value Ref Range    Glucose-Capillary 193 (H) 70 - 99 mg/dL  Glucose, capillary     Status: Abnormal    Collection Time: 03/03/19 11:04 PM  Result Value Ref Range    Glucose-Capillary 163 (H) 70 - 99 mg/dL  Glucose, capillary     Status: Abnormal    Collection Time: 03/04/19  3:06 AM  Result Value Ref Range    Glucose-Capillary 164 (H) 70 - 99 mg/dL  AM Basic metabolic panel     Status: Abnormal    Collection Time: 03/04/19  4:17 AM  Result Value Ref Range    Sodium 140 135 - 145  mmol/L    Potassium 3.6 3.5 - 5.1 mmol/L    Chloride 109 98 - 111 mmol/L    CO2 21 (L) 22 - 32 mmol/L    Glucose, Bld 177 (H) 70 - 99 mg/dL    BUN 10 6 - 20 mg/dL    Creatinine, Ser 0.59 0.44 - 1.00 mg/dL    Calcium 8.7 (L) 8.9 - 10.3 mg/dL    GFR calc non Af Amer >60 >60 mL/min    GFR calc Af Amer >60 >60 mL/min    Anion gap 10 5 - 15  Magnesium     Status: None    Collection Time: 03/04/19  4:17 AM  Result Value Ref Range    Magnesium 1.7 1.7 - 2.4 mg/dL  Phosphorus     Status: None    Collection Time: 03/04/19  4:17 AM  Result Value Ref Range    Phosphorus 2.9 2.5 - 4.6 mg/dL  Glucose, capillary     Status: Abnormal    Collection Time: 03/04/19  7:29 AM  Result Value Ref Range    Glucose-Capillary 202 (H) 70 - 99 mg/dL    Comment 1 Notify RN      Comment 2 Document in Chart    Glucose, capillary     Status: Abnormal    Collection Time: 03/04/19 11:21 AM  Result Value Ref Range    Glucose-Capillary 195 (H) 70 - 99 mg/dL    Comment 1 Notify RN      Comment 2 Document in Chart        Imaging Results (Last 48 hours)  Dg Abd 1 View   Result Date: 03/03/2019 CLINICAL DATA:  Nasogastric tube placement. EXAM: ABDOMEN - 1 VIEW COMPARISON:  One view abdomen 03/02/2019. FINDINGS: 1640 hours. Tip of the nasogastric tube is in the right upper quadrant of the abdomen, likely in the distal stomach or proximal duodenum. The visualized bowel  gas pattern is normal. There are no suspicious abdominal calcifications or acute osseous findings. IMPRESSION: Nasogastric tube tip in the distal stomach or proximal duodenum. Electronically Signed   By: Richardean Sale M.D.   On: 03/03/2019 16:54    Dg Abd 1 View   Result Date: 03/02/2019 CLINICAL DATA:  NG tube placement EXAM: ABDOMEN - 1 VIEW COMPARISON:  None. FINDINGS: Esophageal tube tip overlies the gastric body. Nonobstructed bowel-gas pattern IMPRESSION: Esophageal tube tip overlies the gastric body Electronically Signed   By: Donavan Foil M.D.   On: 03/02/2019 19:04    Ct Head Wo Contrast   Result Date: 03/02/2019 CLINICAL DATA:  Status post revascularization of the occluded right ICA terminus. EXAM: CT HEAD WITHOUT CONTRAST TECHNIQUE: Contiguous axial images were obtained from the base of the skull through the vertex without intravenous contrast. COMPARISON:  CT head and CT perfusion 03/02/2019 FINDINGS: Brain: There is contrast staining within the core infarct area of the right lentiform nucleus. There is also some contrast staining within the body of the right caudate lobe. No other focal areas of contrast enhancement is present. Gray-white differentiation is otherwise normal. Acute hemorrhage or mass lesion is present. There is some mass effect on the right lateral ventricle. Vascular: No hyperdense vessel or unexpected calcification.  The Skull: Calvarium is intact. No focal lytic or blastic lesions are present. Sinuses/Orbits: Mild mucosal thickening is present inferior right maxillary sinus. Is scattered ethmoid opacification and some mucosal thickening in the inferior right frontal sinus. No fluid levels are present. Mastoid air cells are clear. Globes  and orbits are within normal limits. IMPRESSION: 1. Contrast staining in the right lentiform nucleus and caudate following revascularization. This is consistent with areas infarction. 2. Remainder of right MCA territory is within normal  limits. 3. No acute hemorrhage. 4. Mild sinus disease. Electronically Signed   By: San Morelle M.D.   On: 03/02/2019 14:08      Assessment/Plan: Diagnosis: Right ICA occlusion Labs and images (see above) independently reviewed.  Records reviewed and summated above. Stroke: Continue secondary stroke prophylaxis and Risk Factor Modification listed below:   Antiplatelet therapy:   Blood Pressure Management:  Continue current medication with prn's with permisive HTN per primary team Statin Agent:   Diabetes management:   Tobacco abuse: Counseled on appropriate Left sided hemiparesis: fit for orthosis to prevent contractures (resting hand splint for day, wrist cock up splint at night, PRAFO, etc) PT/OT for mobility, ADL training  Motor recovery: Fluoxetine   1. Does the need for close, 24 hr/day medical supervision in concert with the patient's rehab needs make it unreasonable for this patient to be served in a less intensive setting? Yes  2. Co-Morbidities requiring supervision/potential complications: tachypnea (monitor RR and O2 Sats with increased physical exertion), SIRS with fevers and leukocytosis (repeat labs, cont to monitor for signs and symptoms of infection, further workup if indicated), hypokalemia (continue to monitor and replete as necessary), T2 DM (Monitor in accordance with exercise and adjust meds as necessary), HTN (transitioned from IV to oral meds when appropriate, monitor and provide prns in accordance with increased physical exertion and pain), breast cancer s/p bilateral mastectomy, post chemo memory deficits,  back pain, post stroke dysphagia (advance diet as tolerated). 3. Due to bladder management, bowel management, safety, disease management, medication administration and patient education, does the patient require 24 hr/day rehab nursing? Yes 4. Does the patient require coordinated care of a physician, rehab nurse, therapy disciplines of PT/OT/SLP.  To address  physical and functional deficits in the context of the above medical diagnosis(es)? Yes Addressing deficits in the following areas: balance, endurance, locomotion, strength, transferring, bathing, dressing, toileting, cognition, speech, swallowing and psychosocial support 5. Can the patient actively participate in an intensive therapy program of at least 3 hrs of therapy per day at least 5 days per week? Potentially 6. The potential for patient to make measurable gains while on inpatient rehab is excellent 7. Anticipated functional outcomes upon discharge from inpatient rehab are min assist  with PT, min assist with OT, supervision and min assist with SLP. 8. Estimated rehab length of stay to reach the above functional goals is: 18-22 days. 9. Anticipated discharge destination: Home 10. Overall Rehab/Functional Prognosis: good   RECOMMENDATIONS: This patient's condition is appropriate for continued rehabilitative care in the following setting: CIR when medically stable and able to tolerate 3 hours of therapy a day. Patient has agreed to participate in recommended program. Potentially Note that insurance prior authorization may be required for reimbursement for recommended care.   Comment: Rehab Admissions Coordinator to follow up.   I have personally performed a face to face diagnostic evaluation, including, but not limited to relevant history and physical exam findings, of this patient and developed relevant assessment and plan.  Additionally, I have reviewed and concur with the physician assistant's documentation above.    Delice Lesch, MD, ABPMR Bary Leriche, PA-C 03/04/2019        Revision History Date/Time User Provider Type Action  03/04/2019 12:55 PM [name], MD Physician Sign  03/04/2019  12:37 PM Love, Ivan Anchors, PA-C Physician Assistant Share   View Details Report     Routing History

## 2019-03-09 NOTE — Progress Notes (Signed)
Patient admitted to ip rehab during shift. Patient and family informed about safety plan and visitor policy. Patient currently resting in bed at time no further questions noted regarding rehab. Adria Devon, LPN

## 2019-03-09 NOTE — Progress Notes (Signed)
Ann Staggers, MD  Physician  Physical Medicine and Rehabilitation  PMR Pre-admission  Signed  Date of Service:  03/06/2019 5:20 PM      Related encounter: ED to Hosp-Admission (Discharged) from 03/02/2019 in [facility]      Signed         PMR Admission Coordinator Pre-Admission Assessment  Patient: Ann Lopez is an 48 y.o., female MRN: [id number] DOB: September 27, 1970 Height: 5' 4"  (162.6 cm) Weight: 87.1 kg                                                                                                                                                  Insurance Information HMO: yes    PPO:   PCP:      IPA:      80/20:      OTHER:  PRIMARY: Ann Lopez Medicare (dual complete)      Policy#: [id number]      Subscriber: Patient CM Name: Ann Lopez      Phone#: [phone]     Fax#: [phone] Pre-Cert#: [id number]      Employer:    Bernadene Bell case number: [id number] [place] provided by Ann Lopez at [place] for admit to CIR. Pt is approved for 7 days. (initial start start date 11/28 - next review date 12/4). Per Ann Lopez, review date will change once admit date confirmed-as pt is approved for 7 days from day of admit. Concurrent reviews are due to (f): [phone] with heading "for continued stay."  Benefits:  Phone #: online     Name: [email or web address] Eff. Date: 04/09/2018 - 04/09/2019     Deduct: does not have      Out of Pocket Max: $6,700 ($0 met)      Life Max: NA CIR: $225/admission co-pay      SNF: $0/day co-pay for days 1-20, $176/day co-pay for days 21-100; limited to 100 days/cal yr Outpatient: limited by medical necessity     Co-Pay: $0/visit co-pay Home Health: 100% coverage; limited by medical necessity      Co-Pay: 0% co-insurance DME: 80% coverage     Co-Pay: 20% co-insurance Providers:   *QMB Status-if enrolled in Medicaid as a QMB, patient's co-pays and co-insurance are covered.  SECONDARY: Medicaid [place]      Policy#: [id number]      Subscriber: Patient CM Name:        Phone#:      Fax#:  Pre-Cert#:       Employer:  Benefits:  Phone #: [phone]     Name: verified eligibility via [employer] on 03/09/2019 Eff. Date: verified on 03/09/2019     Deduct:       Out of Pocket Max:       Life Max:  CIR:       SNF:  Outpatient:      Co-Pay:  Home Health:  Co-Pay:  DME:      Co-Pay:   Medicaid Application Date:       Case Manager:  Disability Application Date:       Case Worker:   The "Data Collection Information Summary" for patients in Inpatient Rehabilitation Facilities with attached "Privacy Act [place] Records" was provided and verbally reviewed with: Patient  Emergency Contact Information         Contact Information    Name Relation Home Work [place] Son [phone]  [phone]   Ann Lopez   [phone]     Current Medical History  Patient Admitting Diagnosis: Right ICA occlusion   History of Present Illness: Ann Lopez is a 48 year old female with history of T2DM, HTN, gastric bypass, breast cancer s/p bilateral mastectomy and post chemo memory deficits, tobacco use, migraines, back pain who was admitted on 03/02/19 after fall from the commode and found to have left sided weakness with left neglect and right gaze preference. UDS positive with opioids and amphetamines. CTA/P head revealed near occlusive thrombus within R-ICA terminus extending into M1 R-MCA with A1 R-ACA with core infarct in R-MCA and large penumbra as well as incidental finding of left thyroid nodule. She was taken for cerebral angio with emergent thrombectomy and complete revascularization of occluded R- ICA terminus, R-MCA and R-ACA with stent assisted angioplasty of symptomatic proximal R-ICA stenosis by DR. Deveshwar. Post procedure with hypotension felt to be due to carotid manipulation as well as decrease in LOC with increase in LLE tone. EEG showed diffuse encephalopathy and repeat CT head showed some mass effect on  right lateral ventricle and was negative for hemorrhage.   She tolerated extubation without difficulty but had bouts of agitation alternating with lethargy. 2Decho showed EF 60-65% with no LVH and no wall abnormality. Most recent CT head 11/25 showed residual hyperdensity within infarcted right lentiform nucleus c/w resolving confluent petechial hemorrhage and persistent partial effacement of right lateral ventricle. Dr. Erlinda Hong felt that stroke embolic due to large vessel stenosis but PAF not fully ruled out--30 day cardiac event monitor recommended on outpatient basis. Patient with resultant left facial droop with cognitive deficits, left sided weakness and right gaze preference with left neglect affecting mobility and ADLs. CIR recommended due to functional decline. Pt is to admit to CIR on 03/09/2019.   Complete NIHSS TOTAL: 7 Glasgow Coma Scale Score: 15  Past Medical History      Past Medical History:  Diagnosis Date  . Allergy   . Anxiety   . Asthma   . Back pain   . Breast cancer ([place])   . Cancer ([place])   . Depression   . Diabetes mellitus   . Headache   . Hyperlipemia   . Osteopenia   . Osteoporosis    osteopenia    Family History  family history includes Cancer in her sister; Cancer (age of onset: 3) in her mother; Cancer (age of onset: 45) in her maternal grandfather and maternal grandmother; Hypertension in her sister; Hypothyroidism in her brother and sister.  Prior Rehab/Hospitalizations:  Has the patient had prior rehab or hospitalizations prior to admission? No  Has the patient had major surgery during 100 days prior to admission? Yes  Current Medications   Current Facility-Administered Medications:  .   stroke: mapping our early stages of recovery book, , Does not apply, Once, Roland Rack P, MD .  0.9 %  sodium chloride infusion, 250 mL, Intravenous, Continuous, Erick Colace,  NP .  0.9 %  sodium chloride infusion, , Intravenous,  Continuous, Regalado, Belkys A, MD, Last Rate: 100 mL/hr at 03/09/19 0854 .  acetaminophen (TYLENOL) tablet 650 mg, 650 mg, Oral, Q4H PRN, 650 mg at 03/08/19 2230 **OR** acetaminophen (TYLENOL) 160 MG/5ML solution 650 mg, 650 mg, Per Tube, Q4H PRN, 650 mg at 03/05/19 0502 **OR** acetaminophen (TYLENOL) suppository 650 mg, 650 mg, Rectal, Q4H PRN, Greta Doom, MD .  aspirin chewable tablet 81 mg, 81 mg, Oral, Daily, 81 mg at 03/09/19 0856 **OR** aspirin chewable tablet 81 mg, 81 mg, Per Tube, Daily, Deveshwar, Sanjeev, MD, 81 mg at 03/04/19 0917 .  chlorhexidine (PERIDEX) 0.12 % solution 15 mL, 15 mL, Mouth Rinse, BID, Deveshwar, Sanjeev, MD, 15 mL at 03/09/19 0854 .  Chlorhexidine Gluconate Cloth 2 % PADS 6 each, 6 each, Topical, Daily, Luanne Bras, MD, 6 each at 03/09/19 0856 .  DULoxetine (CYMBALTA) DR capsule 60 mg, 60 mg, Oral, Daily, Greta Doom, MD, 60 mg at 03/09/19 0856 .  fenofibrate tablet 160 mg, 160 mg, Oral, Daily, Rosalin Hawking, MD, 160 mg at 03/09/19 0854 .  HYDROcodone-acetaminophen (NORCO/VICODIN) 5-325 MG per tablet 1 tablet, 1 tablet, Oral, Q6H PRN, Candee Furbish, MD, 1 tablet at 03/09/19 0407 .  insulin aspart (novoLOG) injection 0-15 Units, 0-15 Units, Subcutaneous, Q4H, Greta Doom, MD, 2 Units at 03/09/19 0108 .  insulin glargine (LANTUS) injection 15 Units, 15 Units, Subcutaneous, QHS, Biby, Sharon L, NP, 15 Units at 03/08/19 2204 .  MEDLINE mouth rinse, 15 mL, Mouth Rinse, q12n4p, Deveshwar, Sanjeev, MD, 15 mL at 03/07/19 1540 .  pantoprazole (PROTONIX) EC tablet 40 mg, 40 mg, Oral, Daily, Swayze, Ava, DO, 40 mg at 03/09/19 0854 .  QUEtiapine (SEROQUEL) tablet 25 mg, 25 mg, Per Tube, QHS, 25 mg at 03/05/19 2210 **AND** QUEtiapine (SEROQUEL) tablet 12.5 mg, 12.5 mg, Per Tube, Daily, Biby, Sharon L, NP, 12.5 mg at 03/09/19 0855 .  senna-docusate (Senokot-S) tablet 1 tablet, 1 tablet, Oral, BID, Regalado, Belkys A, MD .  temazepam (RESTORIL)  capsule 15 mg, 15 mg, Oral, QHS PRN, Swayze, Ava, DO .  ticagrelor (BRILINTA) tablet 90 mg, 90 mg, Oral, BID, 90 mg at 03/09/19 0855 **OR** ticagrelor (BRILINTA) tablet 90 mg, 90 mg, Per Tube, BID, Deveshwar, Sanjeev, MD, 90 mg at 03/04/19 0093  Patients Current Diet:     Diet Order                  DIET DYS 2 Room service appropriate? Yes; Fluid consistency: Thin  Diet effective now               Precautions / Restrictions Precautions Precautions: Fall Other Brace: L ankle wrapped with ACE for df assist Restrictions Weight Bearing Restrictions: No   Has the patient had 2 or more falls or a fall with injury in the past year?No  Prior Activity Level Community (5-7x/wk): worked as a Optometrist 2x/week for elderly gentleman; very active, has 2 sons in the house. drives and was Independent PTA.   Prior Functional Level Prior Function Level of Independence: Independent  Self Care: Did the patient need help bathing, dressing, using the toilet or eating?  Independent  Indoor Mobility: Did the patient need assistance with walking from room to room (with or without device)? Independent  Stairs: Did the patient need assistance with internal or external stairs (with or without device)? Independent  Functional Cognition: Did the patient need help planning regular tasks such as  shopping or remembering to take medications? Independent  Home Assistive Devices / Equipment Home Assistive Devices/Equipment: Eyeglasses  Prior Device Use: Indicate devices/aids used by the patient prior to current illness, exacerbation or injury? None of the above  Current Functional Level Cognition  Arousal/Alertness: Awake/alert Overall Cognitive Status: Impaired/Different from baseline Difficult to assess due to: Level of arousal Current Attention Level: Sustained Orientation Level: Oriented X4 Following Commands: Follows one step commands inconsistently, Follows one step  commands with increased time Safety/Judgement: Decreased awareness of safety, Decreased awareness of deficits General Comments: When asked, she can remember to attend to L side, but does not do so without cues.  Attention: Sustained Sustained Attention: Impaired Sustained Attention Impairment: Verbal basic, Functional basic Memory: (difficult assess) Awareness: Impaired Awareness Impairment: Intellectual impairment, Emergent impairment Problem Solving: Impaired Problem Solving Impairment: Verbal basic, Functional basic Behaviors: Restless, Impulsive, Poor frustration tolerance Safety/Judgment: Impaired    Extremity Assessment (includes Sensation/Coordination)  Upper Extremity Assessment: RUE deficits/detail, LUE deficits/detail RUE Deficits / Details: AROM in all planes LUE Deficits / Details: noted to adducted toward chest, no attempt to reposition, spontaneous hand movement. pt squeezing with palm pressure  Lower Extremity Assessment: Defer to PT evaluation LLE Deficits / Details: noted to have supination of foot with don of socks    ADLs  Overall ADL's : Needs assistance/impaired Eating/Feeding: NPO Eating/Feeding Details (indicate cue type and reason): fixated on drinking and taking small sip of water during session Grooming: Maximal assistance Upper Body Bathing: Maximal assistance Lower Body Bathing: Total assistance Upper Body Dressing : Maximal assistance Lower Body Dressing: Total assistance Toilet Transfer: +2 for physical assistance, Maximal assistance General ADL Comments: pt initiates sit<Stand with LLE blocked and facilitation at core for upright posture    Mobility  Overal bed mobility: Needs Assistance Bed Mobility: Rolling, Sidelying to Sit, Sit to Sidelying Rolling: Mod assist Sidelying to sit: Mod assist Sit to sidelying: Total assist, +2 for physical assistance, +2 for safety/equipment General bed mobility comments: pt received in recliner after  bathing with nursing    Transfers  Overall transfer level: Needs assistance Equipment used: Left platform walker Transfers: Sit to/from Stand Sit to Stand: +2 safety/equipment, Mod assist General transfer comment: mod A +2 for power up and maintaining midline, L knee not blocked with transfer    Ambulation / Gait / Stairs / Wheelchair Mobility  Ambulation/Gait Ambulation/Gait assistance: Mod assist, +2 physical assistance, +2 safety/equipment Gait Distance (Feet): 18 Feet Assistive device: Left platform walker Gait Pattern/deviations: Step-to pattern, Decreased stride length, Decreased dorsiflexion - left, Decreased weight shift to left, Narrow base of support, Trunk flexed, Scissoring General Gait Details: At first needed facilitation to not scissor with LLE, but then was able to correct with vc's. Support needed at L knee to prevent buckling and hyperextension, needed more support as she fatigued. Needed mod A to guide RW, without assist, veered to R. vc's for stopping frequently to correct posture  Gait velocity: decreased Gait velocity interpretation: <1.31 ft/sec, indicative of household ambulator    Posture / Balance Balance Overall balance assessment: Needs assistance Sitting-balance support: No upper extremity supported, Feet unsupported Sitting balance-Leahy Scale: Poor Postural control: Left lateral lean Standing balance support: No upper extremity supported Standing balance-Leahy Scale: Poor Standing balance comment: with vc's to push down through RW with RUE, able to maintain static standing within RW with min A. Mod A +2 needed for dynamics    Special needs/care consideration BiPAP/CPAP: no CPM: no Continuous Drip IV: 0.9% sodium chloride infusion  Dialysis: no        Days: no Life Vest: no Oxygen: no Special Bed: no Trach Size: no Wound Vac (area): no      Location: NA Skin: right hand ecchymosis, right groin incision       Bowel mgmt:continent, last BM  03/07/2019 Bladder mgmt: external urinary catheter Diabetic mgmt: yes Behavioral consideration : no Chemo/radiation : not currently (history of chemo for breast cancer-has had double mastectomy).      Previous Home Environment (from acute therapy documentation) Living Arrangements: Spouse/significant other, Children  Lives With: Son(71 year old) Available Help at Discharge: Available 24 hours/day Type of Home: Mobile home Home Layout: One level Home Access: Stairs to enter Entrance Stairs-Rails: Can reach both Entrance Stairs-Number of Steps: 3 Home Care Services: No Additional Comments: information per chart from PT session. pt does not sustain attention to questions. pt does report she needs to get out of here to work to pay her mortage she cant loose her house  Discharge Living Setting Plans for Discharge Living Setting: Other (Comment)(plan is to go to her son's house ([place])) Type of Home at Discharge: House Discharge Home Layout: One level Discharge Home Access: Stairs to enter Entrance Stairs-Rails: Can reach both Entrance Stairs-Number of Steps: 2 Discharge Bathroom Shower/Tub: Tub/shower unit, Walk-in shower(grab bars near shower and toilet) Discharge Bathroom Toilet: Standard Discharge Bathroom Accessibility: Yes How Accessible: Accessible via walker Does the patient have any problems obtaining your medications?: No  Social/Family/Support Systems 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 Information: son Larkin Ina): [phone]; Son Marcene Brawn): [phone] Anticipated Caregiver: two older sons (not the ones she lives with) Anticipated Caregiver's Contact Information: see above Ability/Limitations of Caregiver: Min/Mod A Caregiver Availability: 24/7 Discharge Plan Discussed with Primary Caregiver: Yes(two older sons plan to rotate scheduled shifts to accomodate) Is Caregiver In Agreement with Plan?: Yes Does Caregiver/Family have Issues with  Lodging/Transportation while Pt is in Rehab?: No   Goals/Additional Needs Patient/Family Goal for Rehab: PT/OT: Min A; SLP: Supervision/Min A Expected length of stay: 18-22 days Cultural Considerations: NA Dietary Needs: DYS 2, thin liquids Equipment Needs: TBD Pt/Family Agrees to Admission and willing to participate: Yes Program Orientation Provided & Reviewed with Pt/Caregiver Including Roles  & Responsibilities: Yes(pt, Larkin Ina and Jesus (two grown sons))  Barriers to Discharge: Home environment access/layout  Barriers to Discharge Comments: steps to enter; Jesus and Larkin Ina to arrange their schedules for 24/7; they are aware of no SNF option after CIR   Decrease burden of Care through IP rehab admission: NA   Possible need for SNF placement upon discharge:Not anticipated; pt's family has confirmed 24/7 A at DC and understand the plan is for pt to go home with assist once she has completed the CIR program. Pt has great support and two sons who can physically assist as needed. Anticipate pt will make good progress through CIR level program.    Patient Condition: This patient's medical and functional status has changed since the consult dated: 03/04/2019 in which the Rehabilitation Physician determined and documented that the patient's condition is appropriate for intensive rehabilitative care in an inpatient rehabilitation facility. See "History of Present Illness" (above) for medical update. Functional changes are: improvement in bed mobility from Total A +2 to Mod A, and an ability to tolerate gait with Mod A + 2 for 36f. Patient's medical and functional status update has been discussed with the Rehabilitation physician and patient remains appropriate for inpatient rehabilitation. Will admit to inpatient rehab today.  Preadmission  Screen Completed By:  Raechel Ache, OT, 03/09/2019 11:36 AM ______________________________________________________________________   Discussed status  with Dr. Naaman Plummer on 03/09/2019 at 11:36AM and received approval for admission today.  Admission Coordinator:  Raechel Ache, time 11:36AM/Date 03/09/2019         Revision History Date/Time User Provider Type Action  03/09/2019 11:45 AM Ann Staggers, MD Physician Sign  03/09/2019 11:38 AM Raechel Ache, OT Rehab Admission Coordinator Share  View Details Report

## 2019-03-09 NOTE — Progress Notes (Signed)
LE venous duplex       has been completed. Preliminary results can be found under CV proc through chart review. Kniyah Khun, BS, RDMS, RVT   

## 2019-03-09 NOTE — Progress Notes (Signed)
Inpatient Rehabilitation-Admissions Coordinator   I have received insurance approval and medical clearance for admit to CIR today. Pt and her family are willing to proceed with admission today. DC support has been confirmed and we have reviewed insurance benefit letter and consent forms have been signed.   AC has notified RN and TOC team regarding plan for admit today.   Jhonnie Garner, OTR/L  Rehab Admissions Coordinator  [phone] 03/09/2019 11:23 AM

## 2019-03-09 NOTE — H&P (Signed)
Physical Medicine and Rehabilitation Admission H&P    Chief Complaint  Patient presents with  . Stroke with functional deficits    HPI: Ann Lopez is a 48 year old female with history of T2DM, HTN, gastric sleeve, breast cancer s/p bilateral mastectomy with TAH/BSO and post chemo memory deficits, tobacco use, migraines, back pain who was admitted on 03/02/19 after fall from the commode and found to have left sided weakness with left neglect and right gaze preference. UDS positive with opioids and amphetamines.  CTA/P head revealed near occlusive thrombus within R-ICA terminus extending into  M1 R-MCA with A1 R-ACA with core infarct in R-MCA and large penumbra as well as incidental finding of left thyroid nodule. She was taken for cerebral angio with emergent thrombectomy and complete revascularization of occluded R- ICA terminus, R-MCA and R-ACA with stent assisted angioplasty of symptomatic proximal R-ICA stenosis by DR. Deveshwar. Post procedure with hypotension felt to be due to carotid manipulation as well as decrease in LOC with increase in LLE tone. EEG showed diffuse encephalopathy and repeat CT head showed some mass effect on right lateral ventricle and was negative for hemorrhage.    She tolerated extubation without difficulty but had bouts of agitation alternating with lethargy. 2D echo showed EF 60-65% with no LVH and no wall abnormality.  Most recent CT head 11/25 showed residual hyperdensity within infarcted right lentiform nucleus c/w resolving confluent petechial hemorrhage and persistent partial effacement of right lateral ventricle. Dr. Erlinda Hong felt that stroke embolic due to large vessel stenosis but PAF not fully ruled out--30 day cardiac event monitor recommended on outpatient basis. Neurology recommending SBP goal 120-140--hypotensive today with SBP in 90's therefore treated with fluid bolus.   Patient with resultant left facial droop with cognitive deficits, left sided weakness  and right gaze preference with left neglect affecting mobility and ADLs. CIR recommended due to functional decline.    Review of Systems  Constitutional: Negative for chills and fever.  HENT: Negative for hearing loss and tinnitus.   Eyes: Negative for blurred vision and double vision.  Respiratory: Negative for cough and shortness of breath.   Cardiovascular: Negative for chest pain and palpitations.  Gastrointestinal: Positive for constipation. Negative for heartburn.  Genitourinary: Positive for dysuria.  Musculoskeletal: Positive for back pain and myalgias.  Skin: Negative for itching and rash.  Neurological: Positive for sensory change (left foot) and focal weakness. Negative for dizziness and headaches.  Psychiatric/Behavioral: Positive for depression. The patient has insomnia.      Past Medical History:  Diagnosis Date  . Allergy   . Anxiety   . Asthma   . Back pain   . Breast cancer ([place])   . Cancer ([place])   . Depression   . Diabetes mellitus   . Headache   . Hyperlipemia   . Osteopenia   . Osteoporosis    osteopenia    Past Surgical History:  Procedure Laterality Date  . ABDOMINAL HYSTERECTOMY     B oophorectomy for BRCA1 gene  . BREAST SURGERY    . CESAREAN SECTION    . INCISION AND DRAINAGE ABSCESS Left 11/14/2012   Procedure: INCISION AND DRAINAGE ABSCESS LEFT GROIN;  Surgeon: Jamesetta So, MD;  Location: [facility];  Service: General;  Laterality: Left;  . IR ANGIO VERTEBRAL SEL SUBCLAVIAN INNOMINATE UNI R MOD SED  03/02/2019  . IR CT HEAD LTD  03/02/2019  . IR INTRAVSC STENT CERV CAROTID W/O EMB-PROT MOD SED INC ANGIO  03/02/2019  . IR PERCUTANEOUS ART THROMBECTOMY/INFUSION INTRACRANIAL INC DIAG ANGIO  03/02/2019  . LYMPHADENECTOMY    . MASTECTOMY Bilateral   . RADIOLOGY WITH ANESTHESIA N/A 03/02/2019   Procedure: IR WITH ANESTHESIA;  Surgeon: Luanne Bras, MD;  Location: [facility];  Service: Radiology;  Laterality: N/A;    Family History  Problem  Relation Age of Onset  . Cancer Mother 38       breast cancer  . Hypertension Sister   . Hypothyroidism Sister   . Cancer Sister   . Hypothyroidism Brother   . Cancer Maternal Grandmother 74       breast cancer  . Cancer Maternal Grandfather 57       pancreatic cancer    Social History:  Lives with 66 year old son. Helps with grandchild during the day. She reports that she has been smoking cigarettes. She has been smoking about 1- 1.5 PPD.  She has never used smokeless tobacco. She reports has a mixed drink now and again. She does not use illicit drugs.    Allergies  Allergen Reactions  . Azithromycin Other (See Comments)    Unknown reaction  . Morphine And Related Other (See Comments)    Unknown reaction - Can tolerate hydrocodone and dilaudid without side effects  . Nitrofurantoin Other (See Comments)    Unknown reaction  . Penicillins Hives and Itching    Has patient had a PCN reaction causing immediate rash, facial/tongue/throat swelling, SOB or lightheadedness with hypotension: Yes Has patient had a PCN reaction causing severe rash involving mucus membranes or skin necrosis: No Has patient had a PCN reaction that required hospitalization No Has patient had a PCN reaction occurring within the last 10 years: No If all of the above answers are "NO", then may proceed with Cephalosporin use.   . Sulfonamide Derivatives Hives    Medications Prior to Admission  Medication Sig Dispense Refill  . HYDROcodone-acetaminophen (NORCO) 10-325 MG tablet Take 1 tablet by mouth every 6 (six) hours as needed.    . hydrOXYzine (ATARAX/VISTARIL) 10 MG tablet Take 10 mg by mouth 3 (three) times daily.    Marland Kitchen albuterol (PROVENTIL HFA;VENTOLIN HFA) 108 (90 Base) MCG/ACT inhaler Inhale 1 puff into the lungs every 6 (six) hours as needed for wheezing or shortness of breath. 1 Inhaler 6  . albuterol (PROVENTIL) (2.5 MG/3ML) 0.083% nebulizer solution Take 3 mLs (2.5 mg total) by nebulization every 6  (six) hours as needed for wheezing or shortness of breath. 75 mL 12  . aspirin EC 81 MG tablet Take 1 tablet (81 mg total) by mouth daily. 30 tablet 0  . canagliflozin (INVOKANA) 300 MG TABS tablet Take 300 mg by mouth daily before breakfast.    . Choline Fenofibrate (FENOFIBRIC ACID) 135 MG CPDR Take 1 capsule by mouth daily.    . clonazePAM (KLONOPIN) 0.5 MG tablet Take 0.25 mg by mouth 2 (two) times daily as needed for anxiety.     . divalproex (DEPAKOTE ER) 250 MG 24 hr tablet Take 250 mg by mouth 2 (two) times daily.    . DULoxetine (CYMBALTA) 60 MG capsule Take 2 capsules (120 mg total) by mouth daily. (Patient taking differently: Take 60 mg by mouth daily. ) 180 capsule 3  . fenofibrate (TRICOR) 145 MG tablet Take 1 tablet (145 mg total) by mouth daily. 90 tablet 1  . furosemide (LASIX) 20 MG tablet Take 20 mg by mouth.    Marland Kitchen glucose blood (CHOICE DM FORA G20 TEST  STRIPS) test strip Use as instructed 100 each 12  . ibuprofen (ADVIL,MOTRIN) 200 MG tablet Take 800 mg by mouth every 6 (six) hours as needed for fever. Reported on 07/28/2015    . Insulin Glargine (LANTUS SOLOSTAR) 100 UNIT/ML Solostar Pen Inject 20 Units into the skin daily at 10 pm. 5 pen PRN  . Insulin Pen Needle 32G X 4 MM MISC 1 application by Does not apply route daily. 100 each 6  . Lancets (ONETOUCH ULTRASOFT) lancets Use as instructed; check sugar once daily 100 each 12  . mometasone (NASONEX) 50 MCG/ACT nasal spray Place 2 sprays into the nose daily. 17 g 12  . naproxen (NAPROSYN) 500 MG tablet Take 1 tablet (500 mg total) by mouth 2 (two) times daily. 20 tablet 0  . omeprazole (PRILOSEC) 40 MG capsule Take 1 capsule (40 mg total) by mouth daily. 30 capsule 5    Drug Regimen Review  Drug regimen was reviewed and remains appropriate with no significant issues identified  Home: Home Living Family/patient expects to be discharged to:: Private residence Living Arrangements: Spouse/significant other, Children Available  Help at Discharge: Available 24 hours/day Type of Home: Mobile home Home Access: Stairs to enter [employer] of Steps: 3 Entrance Stairs-Rails: Can reach both Home Layout: One level Additional Comments: information per chart from PT session. pt does not sustain attention to questions. pt does report she needs to get out of here to work to pay her mortage she cant loose her house  Lives With: Son(18 year old)   Functional History: Prior Function Level of Independence: Independent  Functional Status:  Mobility: Bed Mobility Overal bed mobility: Needs Assistance Bed Mobility: Rolling, Sidelying to Sit, Sit to Sidelying Rolling: Mod assist Sidelying to sit: Mod assist Sit to sidelying: Total assist, +2 for physical assistance, +2 for safety/equipment General bed mobility comments: pt received in recliner after bathing with nursing Transfers Overall transfer level: Needs assistance Equipment used: Left platform walker Transfers: Sit to/from Stand Sit to Stand: +2 safety/equipment, Mod assist General transfer comment: mod A +2 for power up and maintaining midline, L knee not blocked with transfer Ambulation/Gait Ambulation/Gait assistance: Mod assist, +2 physical assistance, +2 safety/equipment Gait Distance (Feet): 18 Feet Assistive device: Left platform walker Gait Pattern/deviations: Step-to pattern, Decreased stride length, Decreased dorsiflexion - left, Decreased weight shift to left, Narrow base of support, Trunk flexed, Scissoring General Gait Details: At first needed facilitation to not scissor with LLE, but then was able to correct with vc's. Support needed at L knee to prevent buckling and hyperextension, needed more support as she fatigued. Needed mod A to guide RW, without assist, veered to R. vc's for stopping frequently to correct posture  Gait velocity: decreased Gait velocity interpretation: <1.31 ft/sec, indicative of household ambulator    ADL: ADL  Overall ADL's : Needs assistance/impaired Eating/Feeding: NPO Eating/Feeding Details (indicate cue type and reason): fixated on drinking and taking small sip of water during session Grooming: Maximal assistance Upper Body Bathing: Maximal assistance Lower Body Bathing: Total assistance Upper Body Dressing : Maximal assistance Lower Body Dressing: Total assistance Toilet Transfer: +2 for physical assistance, Maximal assistance General ADL Comments: pt initiates sit<Stand with LLE blocked and facilitation at core for upright posture  Cognition: Cognition Overall Cognitive Status: Impaired/Different from baseline Arousal/Alertness: Awake/alert Orientation Level: Oriented X4 Attention: Sustained Sustained Attention: Impaired Sustained Attention Impairment: Verbal basic, Functional basic Memory: (difficult assess) Awareness: Impaired Awareness Impairment: Intellectual impairment, Emergent impairment Problem Solving: Impaired Problem Solving Impairment: Verbal  basic, Functional basic Behaviors: Restless, Impulsive, Poor frustration tolerance Safety/Judgment: Impaired Cognition Arousal/Alertness: Awake/alert Behavior During Therapy: Anxious, Impulsive Overall Cognitive Status: Impaired/Different from baseline Area of Impairment: Following commands, Safety/judgement, Problem solving Current Attention Level: Sustained Following Commands: Follows one step commands inconsistently, Follows one step commands with increased time Safety/Judgement: Decreased awareness of safety, Decreased awareness of deficits Awareness: Intellectual Problem Solving: Difficulty sequencing, Requires verbal cues, Requires tactile cues General Comments: When asked, she can remember to attend to L side, but does not do so without cues.  Difficult to assess due to: Level of arousal   Blood pressure (!) 95/59, pulse (!) 54, temperature 98 F (36.7 C), temperature source Oral, resp. rate 18, height '5\' 4"'$  (1.626  m), weight 87.1 kg, SpO2 99 %. Physical Exam  Nursing note and vitals reviewed. Constitutional: She is oriented to person, place, and time. She appears well-developed and well-nourished.  Slightly drowsy  HENT:  Head: Normocephalic and atraumatic.  Eyes: Pupils are equal, round, and reactive to light. EOM are normal.  Cardiovascular: Normal rate and regular rhythm. Exam reveals no friction rub.  No murmur heard. Respiratory: Effort normal and breath sounds normal. No stridor. No respiratory distress.  GI: Soft. She exhibits no distension. There is no abdominal tenderness.  Musculoskeletal:        General: No tenderness or edema.  Neurological: She is alert and oriented to person, place, and time.  Sl dysarthria. Left central 7. Reasonable insight and awareness. Functional memory. Normal language. Left inattention. Visual fields appear intact. LUE: trace pecs, biceps--->0/5 distally. LLE: 1/5 HE, KE --> 0/5 distally. Senses pain and light touch in all 4's. DTR's increased in LE. Toes up.   Skin: Skin is warm and dry.  Psychiatric: She has a normal mood and affect. Her behavior is normal. Judgment and thought content normal.    Results for orders placed or performed during the hospital encounter of 03/02/19 (from the past 48 hour(s))  Glucose, capillary     Status: None   Collection Time: 03/07/19 11:39 AM  Result Value Ref Range   Glucose-Capillary 92 70 - 99 mg/dL  Glucose, capillary     Status: Abnormal   Collection Time: 03/07/19  3:56 PM  Result Value Ref Range   Glucose-Capillary 170 (H) 70 - 99 mg/dL  Glucose, capillary     Status: Abnormal   Collection Time: 03/07/19  7:31 PM  Result Value Ref Range   Glucose-Capillary 170 (H) 70 - 99 mg/dL  Glucose, capillary     Status: Abnormal   Collection Time: 03/07/19 11:17 PM  Result Value Ref Range   Glucose-Capillary 161 (H) 70 - 99 mg/dL  Glucose, capillary     Status: Abnormal   Collection Time: 03/08/19  3:59 AM  Result Value  Ref Range   Glucose-Capillary 115 (H) 70 - 99 mg/dL  CBC with Differential/Platelet     Status: Abnormal   Collection Time: 03/08/19  5:07 AM  Result Value Ref Range   WBC 10.8 (H) 4.0 - 10.5 K/uL   RBC 4.07 3.87 - 5.11 MIL/uL   Hemoglobin 12.0 12.0 - 15.0 g/dL   HCT 35.3 (L) 36.0 - 46.0 %   MCV 86.7 80.0 - 100.0 fL   MCH 29.5 26.0 - 34.0 pg   MCHC 34.0 30.0 - 36.0 g/dL   RDW 13.5 11.5 - 15.5 %   Platelets 278 150 - 400 K/uL   nRBC 0.0 0.0 - 0.2 %   Neutrophils Relative % 66 %  Neutro Abs 7.2 1.7 - 7.7 K/uL   Lymphocytes Relative 20 %   Lymphs Abs 2.1 0.7 - 4.0 K/uL   Monocytes Relative 8 %   Monocytes Absolute 0.9 0.1 - 1.0 K/uL   Eosinophils Relative 4 %   Eosinophils Absolute 0.4 0.0 - 0.5 K/uL   Basophils Relative 1 %   Basophils Absolute 0.1 0.0 - 0.1 K/uL   Immature Granulocytes 1 %   Abs Immature Granulocytes 0.13 (H) 0.00 - 0.07 K/uL    Comment: Performed at [facility] [address], [place], [place] [address]  Basic metabolic panel     Status: Abnormal   Collection Time: 03/08/19  5:07 AM  Result Value Ref Range   Sodium 139 135 - 145 mmol/L   Potassium 3.7 3.5 - 5.1 mmol/L   Chloride 107 98 - 111 mmol/L   CO2 22 22 - 32 mmol/L   Glucose, Bld 124 (H) 70 - 99 mg/dL   BUN 8 6 - 20 mg/dL   Creatinine, Ser 0.47 0.44 - 1.00 mg/dL   Calcium 8.8 (L) 8.9 - 10.3 mg/dL   GFR calc non Af Amer >60 >60 mL/min   GFR calc Af Amer >60 >60 mL/min   Anion gap 10 5 - 15    Comment: Performed at [facility] [address] [place], [place] [address]  Glucose, capillary     Status: Abnormal   Collection Time: 03/08/19  7:59 AM  Result Value Ref Range   Glucose-Capillary 159 (H) 70 - 99 mg/dL  Glucose, capillary     Status: Abnormal   Collection Time: 03/08/19 12:30 PM  Result Value Ref Range   Glucose-Capillary 121 (H) 70 - 99 mg/dL  Glucose, capillary     Status: Abnormal   Collection Time: 03/08/19  4:17 PM  Result Value Ref Range   Glucose-Capillary 197  (H) 70 - 99 mg/dL  Glucose, capillary     Status: Abnormal   Collection Time: 03/08/19  7:26 PM  Result Value Ref Range   Glucose-Capillary 166 (H) 70 - 99 mg/dL  Glucose, capillary     Status: Abnormal   Collection Time: 03/08/19 11:37 PM  Result Value Ref Range   Glucose-Capillary 125 (H) 70 - 99 mg/dL   Comment 1 Notify RN   Glucose, capillary     Status: Abnormal   Collection Time: 03/09/19  4:51 AM  Result Value Ref Range   Glucose-Capillary 113 (H) 70 - 99 mg/dL  Glucose, capillary     Status: Abnormal   Collection Time: 03/09/19  8:04 AM  Result Value Ref Range   Glucose-Capillary 159 (H) 70 - 99 mg/dL   No results found.     Medical Problem List and Plan: 1.  Left hemiparesis and functional deficits secondary to right MCA infarct after right ICA,MCA, ACA occlusion. Pt s/p reperfusion/stenting---embolic pattern of stroke  -patient may shower  -ELOS/Goals: 21-25 days 2.  Antithrombotics: -DVT/anticoagulation:  Pharmaceutical: Lovenox added 11/30.  Will order dopplers for work up.   -antiplatelet therapy: On ASA/Brillita.  3. Chronic thoracic/ shoulder pain/Pain Management: On hydrocodone rn.  4. Mood: LCSW to follow for evaluation and support. Will order sleep wake chart.   -antipsychotic agents: N/A 5. Neuropsych: This patient is capable of making decisions on her own behalf. 6. Skin/Wound Care: Routine pressure relief measures.  7. Fluids/Electrolytes/Nutrition: Monitor I/O--check lytes in am.  8. R-ICA thrombus s/p stent: On Brillinta and ASA. Monitor CBC serially. Avoid hypotension.  9. Leucocytosis: Trending down with WBC 20-->10.8 on 11/30.  10. Hypomagnesemia: Recheck levels in am. Likely needs multiple vitamins due to history of gastric bypass.  11. T2DM: Hgb A1C- 9.0. Was on lantus 40 units with meal coverage and invokana PTA.  12. Left thyroid nodule/Pulmonary nodules: Per CT chest/abdomen3/2020.  Non-emergent ultrasound for follow up recommended.   13. COPD:  On breo daily with albuterol prn.  14. Anxiety/depression: On Desvenlafaxine and clonazepam PTA? Now on Cymbalta 60 mg/day with Seroquel 12.5 mg am/25 mg HS---will discontinue Seroquel as has not been taking it consistently and continues to report issues with insomnia. Will resume Klonopin prn as at home and observe. Keep sleep chart 13. Bradycardia: HR's has been down to high 40's in the past 24 hours.  Asymptomatic? Will monitor for now.  14. Recurrent hypotension: Received 500 cc IVF bolus this am. Monitor BP tid for now.  15. Left breast cancer s/p bilateral mastectomy:  .       Bary Leriche, PA-C 03/09/2019

## 2019-03-09 NOTE — Discharge Summary (Addendum)
Physician Discharge Summary  Ann Lopez [id number] DOB: 1971/03/11 DOA: 03/02/2019  PCP: Hayden Rasmussen, MD  Admit date: 03/02/2019 Discharge date: 03/09/2019  Admitted From: Home  Disposition:  CIR  Recommendations for Outpatient Follow-up:  1. Follow up with PCP in 1-2 weeks 2. Please obtain BMP/CBC in one week 3. Needs to follow up with neurology post stroke.   Home Health: none  Discharge Condition: stable.  CODE STATUS: full ocde Diet recommendation: Heart Healthy  Brief/Interim Summary: This 48 y/o female with PMH as below presented via EMS to the ED 11/23 am with left hemiparesis, left-sided neglect, and right gaze, NIHSS 12. She was last known normal at 2300 11/22, awoke in am 11/23 and was able to ambulate to the bathroom but then could not stand from the toilet and fell to the ground. She was taken to IR for emergent R MCA embolectomy. When she arrived in 4N ICU, she was minimally responsive, exhibited seizure-like activity upon stimulation, and was hypotensive requiring pressor support.  11/23 CT Head w/o contrast> no hemorrhage 11/23 CTA/CTP >There is occlusive or near occlusive thrombus within the right ICA terminus with occlusive/near occlusive thrombus extending into the M1 right middle cerebral artery and A1 right anterior cerebral artery. There is some reconstitution of flow within proximal M2 and more distal right MCA branches. However, there are additional high-grade stenoses within proximal to mid right M2 branches, likely reflecting additional thrombus. 11/23 stat CT brain>>>occlusive thrombus within the right ICA terminus, extending to the M1 right middle cerebral artery and A1 right anterior cerebral artery 11/24 MRI brain>>> 11/23 EEG>>> consistent with profound diffuse encephalopathic nonspecific  1-acute CVA; acute metabolic encephalopathy related to CVA Due to right M1 occlusion.  Status post thrombectomy on 11/23.  Rule out seizure:  Seizure-like activity with stimulation versus myoclonus.  This has resolved.  She was evaluated by neurology.  Echocardiogram show ejection fraction 60 to 65%. -Patient will be discharged on aspirin and Brilinta. -Neurology recommend 30-day cardiac event monitor.  This will need to be arranged as an outpatient. -LDL was at 39, at goal, no statins indicated, patient was discharge on tricor to treat hypertriglyceridemia.   2-hypotension: Patient has a soft low blood pressure.  She is asymptomatic.  Continue to monitor  3-non-anion gap metabolic acidosis: Resolved received IV fluids. 4-hypokalemia replaced. 5-hyperlipidemia: Continue with TriCor 6-diabetes: Continue with Lantus.  Continue to monitor CBG. 7-GERD continue with Prilosec Anxiety depression on Prozac.  Started on Seroquel this admission.  Unclear why she was on Depakote. 8-1.7 centimeter thyroid nodule: She will need ultrasound follow-up.  Discharge Diagnoses:  Active Problems:   Stroke (cerebrum) (HCC)   Middle cerebral artery embolism, right   Tachypnea   SIRS (systemic inflammatory response syndrome) (HCC)   Hypokalemia   Diabetes mellitus type 2 in obese (HCC)   Tobacco abuse   History of breast cancer   Cognitive deficits   Dysphagia, post-stroke    Discharge Instructions  Discharge Instructions    Ambulatory referral to Neurology   Complete by: As directed    Follow up with Dr. Leonie Man at [facility] in 4-6 weeks. Too complicated for RN to follow. Thanks.   Diet - low sodium heart healthy   Complete by: As directed    Increase activity slowly   Complete by: As directed      Allergies as of 03/09/2019      Reactions   Azithromycin Other (See Comments)   Unknown reaction   Morphine And Related Other (  See Comments)   Unknown reaction - Can tolerate hydrocodone and dilaudid without side effects   Nitrofurantoin Other (See Comments)   Unknown reaction   Penicillins Hives, Itching   Has patient had a PCN reaction  causing immediate rash, facial/tongue/throat swelling, SOB or lightheadedness with hypotension: Yes Has patient had a PCN reaction causing severe rash involving mucus membranes or skin necrosis: No Has patient had a PCN reaction that required hospitalization No Has patient had a PCN reaction occurring within the last 10 years: No If all of the above answers are "NO", then may proceed with Cephalosporin use.   Sulfonamide Derivatives Hives      Medication List    STOP taking these medications   divalproex 250 MG 24 hr tablet Commonly known as: DEPAKOTE ER   furosemide 20 MG tablet Commonly known as: LASIX   hydrOXYzine 10 MG tablet Commonly known as: ATARAX/VISTARIL   ibuprofen 200 MG tablet Commonly known as: ADVIL   Insulin Glargine 100 UNIT/ML Solostar Pen Commonly known as: Lantus SoloStar Replaced by: insulin glargine 100 UNIT/ML injection   Insulin Pen Needle 32G X 4 MM Misc   naproxen 500 MG tablet Commonly known as: NAPROSYN     TAKE these medications   albuterol (2.5 MG/3ML) 0.083% nebulizer solution Commonly known as: PROVENTIL Take 3 mLs (2.5 mg total) by nebulization every 6 (six) hours as needed for wheezing or shortness of breath.   albuterol 108 (90 Base) MCG/ACT inhaler Commonly known as: VENTOLIN HFA Inhale 1 puff into the lungs every 6 (six) hours as needed for wheezing or shortness of breath.   aspirin EC 81 MG tablet Take 1 tablet (81 mg total) by mouth daily.   clonazePAM 0.5 MG tablet Commonly known as: KLONOPIN Take 0.25 mg by mouth 2 (two) times daily as needed for anxiety.   DULoxetine 60 MG capsule Commonly known as: CYMBALTA Take 2 capsules (120 mg total) by mouth daily. What changed: how much to take   fenofibrate 145 MG tablet Commonly known as: TRICOR Take 1 tablet (145 mg total) by mouth daily.   Fenofibric Acid 135 MG Cpdr Take 1 capsule by mouth daily.   glucose blood test strip Commonly known as: Choice DM Fora G20 Test  Strips Use as instructed   HYDROcodone-acetaminophen 10-325 MG tablet Commonly known as: NORCO Take 1 tablet by mouth every 6 (six) hours as needed.   insulin glargine 100 UNIT/ML injection Commonly known as: LANTUS Inject 0.15 mLs (15 Units total) into the skin at bedtime. Replaces: Insulin Glargine 100 UNIT/ML Solostar Pen   Invokana 300 MG Tabs tablet Generic drug: canagliflozin Take 300 mg by mouth daily before breakfast.   mometasone 50 MCG/ACT nasal spray Commonly known as: Nasonex Place 2 sprays into the nose daily.   omeprazole 40 MG capsule Commonly known as: PRILOSEC Take 1 capsule (40 mg total) by mouth daily.   onetouch ultrasoft lancets Use as instructed; check sugar once daily   pantoprazole 40 MG tablet Commonly known as: PROTONIX Take 1 tablet (40 mg total) by mouth daily. Start taking on: March 10, 2019   QUEtiapine 25 MG tablet Commonly known as: SEROQUEL Place 1 tablet (25 mg total) into feeding tube at bedtime.   QUEtiapine 25 MG tablet Commonly known as: SEROQUEL Place 0.5 tablets (12.5 mg total) into feeding tube daily. Start taking on: March 10, 2019   senna-docusate 8.6-50 MG tablet Commonly known as: Senokot-S Take 1 tablet by mouth 2 (two) times daily.  temazepam 15 MG capsule Commonly known as: RESTORIL Take 1 capsule (15 mg total) by mouth at bedtime as needed for sleep.   ticagrelor 90 MG Tabs tablet Commonly known as: BRILINTA Take 1 tablet (90 mg total) by mouth 2 (two) times daily.      Follow-up Information    Luanne Bras, MD Follow up in 4 week(s).   Specialties: Interventional Radiology, Radiology Why: Please follow-up with Dr. Estanislado Pandy in clinic 4 weeks after discharge. Our office will call you to set up this appointment. Contact information: [place] [address] [phone]        Garvin Fila, MD. Schedule an appointment as soon as possible for a visit in 4 week(s).    Specialties: Neurology, Radiology Contact information: [address] [place] [address] [phone]          Allergies  Allergen Reactions  . Azithromycin Other (See Comments)    Unknown reaction  . Morphine And Related Other (See Comments)    Unknown reaction - Can tolerate hydrocodone and dilaudid without side effects  . Nitrofurantoin Other (See Comments)    Unknown reaction  . Penicillins Hives and Itching    Has patient had a PCN reaction causing immediate rash, facial/tongue/throat swelling, SOB or lightheadedness with hypotension: Yes Has patient had a PCN reaction causing severe rash involving mucus membranes or skin necrosis: No Has patient had a PCN reaction that required hospitalization No Has patient had a PCN reaction occurring within the last 10 years: No If all of the above answers are "NO", then may proceed with Cephalosporin use.   . Sulfonamide Derivatives Hives    Consultations:  Neurology  IR  CCM   Procedures/Studies: Ct Angio Head W Or Wo Contrast  Result Date: 03/02/2019 CLINICAL DATA:  Stroke code.  Left-sided weakness EXAM: CT ANGIOGRAPHY HEAD AND NECK CT PERFUSION BRAIN TECHNIQUE: Multidetector CT imaging of the head and neck was performed using the standard protocol during bolus administration of intravenous contrast. Multiplanar CT image reconstructions and MIPs were obtained to evaluate the vascular anatomy. Carotid stenosis measurements (when applicable) are obtained utilizing NASCET criteria, using the distal internal carotid diameter as the denominator. Multiphase CT imaging of the brain was performed following IV bolus contrast injection. Subsequent parametric perfusion maps were calculated using RAPID software. CONTRAST:  157mL OMNIPAQUE IOHEXOL 350 MG/ML SOLN COMPARISON:  Noncontrast head CT performed earlier the same day. FINDINGS: CTA NECK FINDINGS Aortic arch: Standard branching. Right carotid system: CCA widely patent.  Abnormal hypodensity within the origin of the right ICA and within the right carotid bulb, likely reflecting a combination of soft plaque and adherent thrombus with resultant high-grade stenosis. Distal to this, the right ICA is asymmetrically small in caliber within the neck, but patent. Left carotid system: CCA widely patent. Mild soft plaque at the carotid bifurcation and within the proximal ICA without significant stenosis of the proximal ICA (50% or greater). Distal to this, the left ICA is widely patent within the neck without stenosis. Vertebral arteries: Codominant. Streak artifact from a dense left-sided contrast bolus somewhat limits evaluation of the V1 left vertebral artery. Within this limitation, the bilateral vertebral arteries are patent throughout the neck without significant stenosis (50% or greater). Skeleton: No acute bony abnormality. Congenital C3-C4 vertebral body fusion Other neck: No soft tissue neck mass or pathologically enlarged cervical chain lymph nodes. 1.7 cm nodule within the right thyroid lobe. Upper chest: No consolidation within the visualized lung apices. Review  of the MIP images confirms the above findings CTA HEAD FINDINGS Anterior circulation: The right internal carotid artery is patent through the siphon region. There is occlusive or near occlusive thrombus within the right ICA terminus with occlusive/near occlusive thrombus extending into the M1 right middle cerebral artery and A1 right anterior cerebral artery. There is some reconstitution of flow within proximal M2 and more distal right MCA branches. However, there are additional high-grade stenoses within proximal to mid right M2 branches, likely reflecting additional thrombus Reconstitution of flow within the distal A1 right ACA, likely from cross flow via the anterior communicating artery. The A1 left ACA is patent without significant stenosis. The A2 and more distal anterior cerebral arteries are patent. The  intracranial left internal carotid arteries patent without significant stenosis. The left middle cerebral artery is patent without significant proximal stenosis. No intracranial aneurysm is identified. Posterior circulation: The intracranial vertebral arteries are patent without significant stenosis, as is the basilar artery. The bilateral posterior cerebral arteries are patent without significant proximal stenosis. A sizable right posterior communicating artery is present and patent, although the direction of flow within this vessel is uncertain. Venous sinuses: Within limitations of contrast timing, no convincing thrombus. Anatomic variants: As described. Review of the MIP images confirms the above findings CT Brain Perfusion Findings: CBF (<30%) Volume: 23mL Perfusion (Tmax>6.0s) volume: 66mL Mismatch Volume: 59mL Infarction Location: Right MCA vascular territory IMPRESSION: CTA neck: 1. Prominent abnormal hypodensity within the origin of the right internal carotid artery and right carotid bulb, likely reflecting a combination of soft plaque and adherent thrombus. Resultant high-grade stenosis at this site. 2. Left common and internal carotid arteries patent within the neck without significant stenosis. 3. Bilateral vertebral arteries patent within the neck without significant stenosis. 4. 1.7 cm left thyroid lobe nodule. Nonemergent thyroid ultrasound recommended for further evaluation. CTA head: 1. Occlusive/near occlusive thrombus within the right ICA terminus, extending into the M1 right middle cerebral artery and A1 right anterior cerebral artery. There is some reconstitution of flow within proximal M2 and more distal right MCA branches. However, there are additional high-grade stenoses within proximal to mid right M2 branches, likely reflecting additional thrombus. Opacification of the distal A1 right ACA and more distal right ACA, likely from cross flow via the anterior communicating artery. 2. No other  intracranial large vessel occlusion or proximal high-grade arterial stenosis. CT perfusion head: 5 mL core infarct within the right MCA vascular territory. 70 mL region of hypoperfused parenchyma within the right MCA vascular territory. Reported mismatch volume 65 mL. Electronically Signed   By: Kellie Simmering DO   On: 03/02/2019 09:31   Dg Abd 1 View  Result Date: 03/03/2019 CLINICAL DATA:  Nasogastric tube placement. EXAM: ABDOMEN - 1 VIEW COMPARISON:  One view abdomen 03/02/2019. FINDINGS: 1640 hours. Tip of the nasogastric tube is in the right upper quadrant of the abdomen, likely in the distal stomach or proximal duodenum. The visualized bowel gas pattern is normal. There are no suspicious abdominal calcifications or acute osseous findings. IMPRESSION: Nasogastric tube tip in the distal stomach or proximal duodenum. Electronically Signed   By: Richardean Sale M.D.   On: 03/03/2019 16:54   Dg Abd 1 View  Result Date: 03/02/2019 CLINICAL DATA:  NG tube placement EXAM: ABDOMEN - 1 VIEW COMPARISON:  None. FINDINGS: Esophageal tube tip overlies the gastric body. Nonobstructed bowel-gas pattern IMPRESSION: Esophageal tube tip overlies the gastric body Electronically Signed   By: Donavan Foil M.D.   On:  03/02/2019 19:04   Ct Head Wo Contrast  Result Date: 03/04/2019 CLINICAL DATA:  Stroke, follow-up. EXAM: CT HEAD WITHOUT CONTRAST TECHNIQUE: Contiguous axial images were obtained from the base of the skull through the vertex without intravenous contrast. COMPARISON:  CT head without contrast 03/02/2019 CTA head and neck 03/02/2019 FINDINGS: Brain: There is residual hyperdensity within the infarcted right lentiform nucleus. This likely represents resolving confluent petechial hemorrhage surrounding edema is again noted. There is partial effacement of the right lateral ventricle without midline shift. No new cortical infarct is present. Left hemisphere is unremarkable. Brainstem and cerebellum are normal.  Vascular: No hyperdense vessel or unexpected calcification. Skull: Calvarium is intact. No focal lytic or blastic lesions are present. Sinuses/Orbits: Ethmoid sinus opacification is stable. No new sinus disease is present. The globes and orbits are within normal limits. IMPRESSION: 1. Residual hyperdensity within the infarcted right lentiform nucleus compatible with resolving confluent petechial hemorrhage, PH2. 2. No new cortical infarct. 3. Persistent partial effacement of the right lateral ventricle without midline shift. Electronically Signed   By: San Morelle M.D.   On: 03/04/2019 15:36   Ct Head Wo Contrast  Result Date: 03/02/2019 CLINICAL DATA:  Status post revascularization of the occluded right ICA terminus. EXAM: CT HEAD WITHOUT CONTRAST TECHNIQUE: Contiguous axial images were obtained from the base of the skull through the vertex without intravenous contrast. COMPARISON:  CT head and CT perfusion 03/02/2019 FINDINGS: Brain: There is contrast staining within the core infarct area of the right lentiform nucleus. There is also some contrast staining within the body of the right caudate lobe. No other focal areas of contrast enhancement is present. Gray-white differentiation is otherwise normal. Acute hemorrhage or mass lesion is present. There is some mass effect on the right lateral ventricle. Vascular: No hyperdense vessel or unexpected calcification.  The Skull: Calvarium is intact. No focal lytic or blastic lesions are present. Sinuses/Orbits: Mild mucosal thickening is present inferior right maxillary sinus. Is scattered ethmoid opacification and some mucosal thickening in the inferior right frontal sinus. No fluid levels are present. Mastoid air cells are clear. Globes and orbits are within normal limits. IMPRESSION: 1. Contrast staining in the right lentiform nucleus and caudate following revascularization. This is consistent with areas infarction. 2. Remainder of right MCA territory is  within normal limits. 3. No acute hemorrhage. 4. Mild sinus disease. Electronically Signed   By: San Morelle M.D.   On: 03/02/2019 14:08   Ct Angio Neck W Or Wo Contrast  Result Date: 03/02/2019 CLINICAL DATA:  Stroke code.  Left-sided weakness. EXAM: CT ANGIOGRAPHY HEAD AND NECK CT PERFUSION BRAIN TECHNIQUE: Multidetector CT imaging of the head and neck was performed using the standard protocol during bolus administration of intravenous contrast. Multiplanar CT image reconstructions and MIPs were obtained to evaluate the vascular anatomy. Carotid stenosis measurements (when applicable) are obtained utilizing NASCET criteria, using the distal internal carotid diameter as the denominator. Multiphase CT imaging of the brain was performed following IV bolus contrast injection. Subsequent parametric perfusion maps were calculated using RAPID software. CONTRAST:  Intravenous contrast given not known at this time COMPARISON:  Noncontrast head CT FINDINGS: CTA NECK FINDINGS Aortic arch: Standard branching. Right carotid system: CCA widely patent. Abnormal hypodensity within the origin of the right ICA and within the right carotid bulb, likely reflecting a combination of soft plaque and adherent thrombus with resultant high-grade stenosis. Distal to this, the right ICA is asymmetrically small in caliber within the neck, but patent. Left carotid  system: CCA widely patent. Mild soft plaque at the carotid bifurcation and within the proximal ICA without significant stenosis of the proximal ICA (50% or greater). Distal to this, the left ICA is widely patent within the neck without stenosis. Vertebral arteries: Codominant. Streak artifact from a dense left-sided contrast bolus somewhat limits evaluation of the V1 left vertebral artery. Within this limitation, the bilateral vertebral arteries are patent throughout the neck without significant stenosis (50% or greater). Skeleton: No acute bony abnormality. Congenital  C3-C4 vertebral body fusion Other neck: No soft tissue neck mass or pathologically enlarged cervical chain lymph nodes. 1.7 cm nodule within the right thyroid lobe. Upper chest: No consolidation within the visualized lung apices. Review of the MIP images confirms the above findings CTA HEAD FINDINGS Anterior circulation: The right internal carotid artery is patent through the siphon region. There is occlusive or near occlusive thrombus within the right ICA terminus with occlusive/near occlusive thrombus extending into the M1 right middle cerebral artery and A1 right anterior cerebral artery. There is some reconstitution of flow within proximal M2 and more distal right MCA branches. However, there are additional high-grade stenoses within proximal to mid right M2 branches, likely reflecting additional thrombus Reconstitution of flow within the distal A1 right ACA, likely from cross flow via the anterior communicating artery. The A1 left ACA is patent without significant stenosis. The A2 and more distal anterior cerebral arteries are patent. The intracranial left internal carotid arteries patent without significant stenosis. The left middle cerebral artery is patent without significant proximal stenosis. No intracranial aneurysm is identified. Posterior circulation: The intracranial vertebral arteries are patent without significant stenosis, as is the basilar artery. The bilateral posterior cerebral arteries are patent without significant proximal stenosis. A sizable right posterior communicating artery is present and patent, although the direction of flow within this vessel is uncertain. Venous sinuses: Within limitations of contrast timing, no convincing thrombus. Anatomic variants: As described. Review of the MIP images confirms the above findings CT Brain Perfusion Findings: CBF (<30%) Volume: 54mL Perfusion (Tmax>6.0s) volume: 53mL Mismatch Volume: 45mL Infarction Location:Right MCA vascular territory These results  were called by telephone at the time of interpretation on 03/02/2019 at 8:46 am to provider MCNEILL [facility] , who verbally acknowledged these results. IMPRESSION: CTA neck: 1. Prominent abnormal hypodensity within the origin of the right internal carotid artery and right carotid bulb, likely reflecting a combination of soft plaque and adherent thrombus. Resultant high-grade stenosis at this site. 2. Left common and internal carotid arteries patent within the neck without significant stenosis. 3. Bilateral vertebral arteries patent within the neck without significant stenosis. 4. 1.7 cm left thyroid lobe nodule. Nonemergent thyroid ultrasound recommended for further evaluation. CTA head: 1. Occlusive/near occlusive thrombus within the right ICA terminus, extending into the M1 right middle cerebral artery and A1 right anterior cerebral artery. There is some reconstitution of flow within proximal M2 and more distal right MCA branches. However, there are additional high-grade stenoses within proximal to mid right M2 branches, likely reflecting additional thrombus. Opacification of the distal A1 right ACA and more distal right ACA, likely from cross flow via the anterior communicating artery. 2. No other intracranial large vessel occlusion or proximal high-grade arterial stenosis. CT perfusion head: 5 mL core infarct within the right MCA vascular territory. 70 mL region of hypoperfused parenchyma within the right MCA vascular territory. Reported mismatch volume 65 mL. Electronically Signed   By: Kellie Simmering DO   On: 03/02/2019 09:08   Ir Sherre Lain  Stent Cerv Carotid W/o Emb-prot Mod Sed  Result Date: 03/06/2019 INDICATION: New onset right gaze deviation, left-sided hemiplegia and neglect. Occluded right internal carotid artery terminus, right middle cerebral artery and right anterior cerebral artery proximally. High-grade stenosis of the right internal carotid artery proximally. EXAM: 1. EMERGENT LARGE VESSEL  OCCLUSION THROMBOLYSIS (anterior CIRCULATION) 2. Stent assisted angioplasty of acutely symptomatic right internal carotid artery severe stenosis. COMPARISON:  CT angiogram of the head and neck of March 02, 2019. MEDICATIONS: Vancomycin 1 g IV antibiotic was administered within 1 hour of the procedure. ANESTHESIA/SEDATION: General anesthesia. CONTRAST:  Isovue 300 approximately 85 cc. FLUOROSCOPY TIME:  Fluoroscopy Time: 37 minutes 54 seconds (1400 mGy). COMPLICATIONS: None immediate. TECHNIQUE: Following a full explanation of the procedure along with the potential associated complications, an informed witnessed consent was obtained from the patient. The risks of intracranial hemorrhage of 10%, worsening neurological deficit, ventilator dependency, death and inability to revascularize were all reviewed in detail with the patient. The patient was then put under general anesthesia by the Department of Anesthesiology at [facility]. The right groin was prepped and draped in the usual sterile fashion. Thereafter using modified Seldinger technique, transfemoral access into the right common femoral artery was obtained without difficulty. Over a 0.035 inch guidewire a 5 French Pinnacle sheath was inserted. Through this, and also over a 0.035 inch guidewire a 5 [place] JB 1 catheter was advanced to the aortic arch region and selectively positioned in the innominate artery and the right common carotid artery. FINDINGS: The innominate artery angiogram demonstrates the right subclavian artery and the right common carotid artery proximally to be widely patent. The right vertebral artery origin is widely patent. The vessel is seen to opacify to the cranial skull base. The opacified visualized right vertebrobasilar junction, the basilar artery, the posterior cerebral arteries, the superior cerebellar arteries are grossly patent into the delayed arterial phase. The right common carotid arteriogram demonstrates the right  external carotid artery and its major branches to be widely patent. The right internal carotid artery at the bulb demonstrates severe pre occlusive stenosis secondary to a smooth atherosclerotic plaque projecting anteriorly. No evidence of ulcerations or of intraluminal filling defects is seen. More distally the right internal carotid artery is seen to opacify to the cranial skull base. Wide patency is seen of the petrous and proximal cavernous segments. Distal to the right posterior communicating artery, there is complete angiographic occlusion with filling defects within the right internal carotid artery terminus with minute contrast seen in the anterior cerebral artery proximally but none in the right middle cerebral artery. The right posterior communicating artery is seen opacifying the right posterior cerebral artery distribution. PROCEDURE: The diagnostic JB 1 catheter in the right common carotid artery was exchanged over a 0.035 inch 300 cm Rosen exchange guidewire for an 8 [place] Pinnacle sheath in the right groin. This was then connected to continuous heparinized saline infusion. Over the exchange guidewire, an 087 Walrus balloon guide catheter which had been prepped with 50% contrast and 50% heparinized saline infusion was advanced and positioned just proximal to the right common carotid bifurcation. The guidewire was removed. Good aspiration obtained from the hub of the balloon guide catheter. A gentle control arteriogram demonstrated no change in the extracranial or intracranial circulation. Over a 0.014 inch standard Synchro micro guidewire, a 150 cm 021 Trevo ProVue microcatheter was advanced to the distal end of balloon guide catheter. The micro guidewire was then gently manipulated with a torque device across  the tight stenosis and advanced with microcatheter to the petrous horizontal segment. The guidewire was removed. Good aspiration obtained from the microcatheter. Microcatheter was exchanged for  a 014 inch Softip 300 cm exchange micro guidewire with the wire having a J configuration at the distal end. A gentle control arteriogram performed through the balloon guide catheter demonstrated patency of the right internal carotid artery to the intracranial compartment A 4 mm x 30 mm Viatrac 014 inch angioplasty balloon catheter which had been prepped with 50% contrast and 50% heparinized saline infusion was then advanced using the rapid exchange technique and positioned across the tight stenosis. A control angioplasty was then performed using a micro inflation syringe device via micro tubing with the balloon being inflated just over 4 mm where it was maintained for approximately 25 seconds. Balloon was deflated and retrieved and removed. A control arteriogram performed through the balloon guide catheter in the right common carotid artery demonstrates significantly improved caliber and flow through the angioplastied segment. More distally no significant change was seen in the intracranial circulation with occluded right internal carotid artery at the terminus. At this time the combination of an 021 Trevo ProVue microcatheter inside of a 6 French 132 cm large-bore guide catheter was advanced over the exchange micro guidewire to the petrous segment. The exchange micro guidewire was replaced with a regular 014 inch Synchro standard micro guidewire with a J configuration. Using a torque device, gentle manipulation was then performed to advanced it through the occluded right internal carotid terminus into the right middle cerebral artery inferior division M2 M3 region followed by the microcatheter. The guidewire was removed. Good aspiration was obtained from the hub of the microcatheter. A gentle control arteriogram performed through the microcatheter demonstrated safe position of tip of the microcatheter. This was then connected to continuous heparinized saline infusion. A 4 mm x 40 mm Solitaire X retrieval device was  then advanced to the distal end the microcatheter. The O ring on the delivery microcatheter was loosened. With slight forward gentle traction with the right hand on the delivery micro guidewire with the left hand the delivery microcatheter was retrieved unsheathing the device. Also the 6 French large-bore catheter was advanced to engage the occluded right internal carotid artery terminus and extend into the proximal right M1 segment to complete absence of aspiration on the Penumbra aspiration device. With proximal flow arrest in the right internal carotid artery by inflating the balloon of the guide catheter, after 2-1/2 minutes, the combination of the retrieval device, catheter and the 6 French large-bore were catheter combination were retrieved and removed as aspiration was performed at the hub of the Walrus balloon guide catheter. Following removal of the combination and reversal of flow arrest, a control arteriogram performed through the balloon guide catheter in the internal carotid artery demonstrated complete angiographic revascularization of the right anterior cerebral artery and the right middle cerebral artery and the distal right common carotid artery. The right posterior communicating artery remained patent. There was a small distal M3 M4 branch which demonstrated slow flow. A TICI 2C revascularization had been achieved. The balloon guide catheter was now retrieved more proximally just proximal to the right common carotid bifurcation. Measurements were performed of the right internal carotid artery just distal to the high-grade stenosis and also the right common carotid artery. It was decided to use a 6/8 mm x 40 mm Xact stent. An exchange 014 inch 300 cm Transend Softip exchange micro guidewire was positioned in the horizontal petrous  segment and the delivery microcatheter was retrieved and removed as mentioned above. Using the rapid exchange technique, the delivery apparatus with the stent was advanced  and positioned across the angioplastied segment of the right internal carotid artery. The stent was then deployed in the usual fashion without any difficulty. The delivery apparatus was then retrieved and removed. A control arteriogram performed through the balloon guide catheter in the right common carotid artery demonstrated excellent apposition and flow through the stented segment. There was a mild circumferential waist. However, further runs demonstrated expansion of this. Flow continued to be patent in the right external carotid artery. Control arteriograms were then performed at 15, 30 and 50 minutes post deployment of the stent. Due to the wide patency of the angioplastied segment with the stent it was decided not to do a post stent angioplasty. About 20 minutes post stent deployment, there was a mild irregularity noted at the site of the previous angioplasty. Prior to the angioplasty, the patient was loaded with 81 mg of aspirin, and 180 mg of Brilinta via an orogastric tube. Also following the angioplasty, the patient was given a total of 7.5 mg of intra-arterial Integrilin. Despite this irregular filling defects were seen at the site of the angioplasty following stent placement probably representing platelet aggregation. This prompted the use of a total of 2 mg of Aggrastat given in 40 mL intra-arterially. Control arteriograms performed 10 minutes following the completion of Aggrastat demonstrated improved caliber with decrease in the irregular filling defects probably representing inhibition of the platelet formation. A final control arteriogram performed through the right common carotid artery demonstrated excellent flow through the angioplastied and stented segment of the right internal carotid proximally. More distally wide patency was seen of the right internal carotid artery, the right posterior communicating artery, the right middle cerebral artery and the right anterior cerebral artery. The balloon  guide catheter was retrieved and removed. Manual pressure was held at the right common femoral puncture site over about 20 minutes. Distal pulses remained palpable in the dorsalis pedis, and the posterior tibial regions. A CT angiogram performed demonstrated faint contrast stain in the putamen on the right side and to a lesser degree the caudate nucleus. No masses or midline shift was seen. The patient was then extubated. Upon recovery, the patient moved her right side freely. She was able to slightly bend her left knee. She was then transferred to the neuro ICU for post thrombectomy management. Throughout the procedure, the patient's blood pressure and neurological status remained stable. No evidence of extravasation of contrast was seen. IMPRESSION: Status post endovascular revascularization of occluded right internal carotid artery terminus, right middle cerebral and right anterior cerebral A1 segment proximally with 1 pass using the 4 mm x 40 mm Solitaire X retrieval device, and Penumbra aspiration achieving a TICI 2C revascularization. Status post endovascular stent assisted angioplasty of symptomatic severe proximal right ICA stenosis. PLAN: Follow-up in the clinic 4 weeks post discharge. Electronically Signed   By: Luanne Bras M.D.   On: 03/03/2019 09:58   [place]  Result Date: 03/06/2019 INDICATION: New onset right gaze deviation, left-sided hemiplegia and neglect. Occluded right internal carotid artery terminus, right middle cerebral artery and right anterior cerebral artery proximally. High-grade stenosis of the right internal carotid artery proximally. EXAM: 1. EMERGENT LARGE VESSEL OCCLUSION THROMBOLYSIS (anterior CIRCULATION) 2. Stent assisted angioplasty of acutely symptomatic right internal carotid artery severe stenosis. COMPARISON:  CT angiogram of the head and neck of March 02, 2019. MEDICATIONS: Vancomycin 1 g IV antibiotic was administered within 1 hour of the procedure.  ANESTHESIA/SEDATION: General anesthesia. CONTRAST:  Isovue 300 approximately 85 cc. FLUOROSCOPY TIME:  Fluoroscopy Time: 37 minutes 54 seconds (1400 mGy). COMPLICATIONS: None immediate. TECHNIQUE: Following a full explanation of the procedure along with the potential associated complications, an informed witnessed consent was obtained from the patient. The risks of intracranial hemorrhage of 10%, worsening neurological deficit, ventilator dependency, death and inability to revascularize were all reviewed in detail with the patient. The patient was then put under general anesthesia by the Department of Anesthesiology at [facility]. The right groin was prepped and draped in the usual sterile fashion. Thereafter using modified Seldinger technique, transfemoral access into the right common femoral artery was obtained without difficulty. Over a 0.035 inch guidewire a 5 French Pinnacle sheath was inserted. Through this, and also over a 0.035 inch guidewire a 5 [place] JB 1 catheter was advanced to the aortic arch region and selectively positioned in the innominate artery and the right common carotid artery. FINDINGS: The innominate artery angiogram demonstrates the right subclavian artery and the right common carotid artery proximally to be widely patent. The right vertebral artery origin is widely patent. The vessel is seen to opacify to the cranial skull base. The opacified visualized right vertebrobasilar junction, the basilar artery, the posterior cerebral arteries, the superior cerebellar arteries are grossly patent into the delayed arterial phase. The right common carotid arteriogram demonstrates the right external carotid artery and its major branches to be widely patent. The right internal carotid artery at the bulb demonstrates severe pre occlusive stenosis secondary to a smooth atherosclerotic plaque projecting anteriorly. No evidence of ulcerations or of intraluminal filling defects is seen. More  distally the right internal carotid artery is seen to opacify to the cranial skull base. Wide patency is seen of the petrous and proximal cavernous segments. Distal to the right posterior communicating artery, there is complete angiographic occlusion with filling defects within the right internal carotid artery terminus with minute contrast seen in the anterior cerebral artery proximally but none in the right middle cerebral artery. The right posterior communicating artery is seen opacifying the right posterior cerebral artery distribution. PROCEDURE: The diagnostic JB 1 catheter in the right common carotid artery was exchanged over a 0.035 inch 300 cm Rosen exchange guidewire for an 8 [place] Pinnacle sheath in the right groin. This was then connected to continuous heparinized saline infusion. Over the exchange guidewire, an 087 Walrus balloon guide catheter which had been prepped with 50% contrast and 50% heparinized saline infusion was advanced and positioned just proximal to the right common carotid bifurcation. The guidewire was removed. Good aspiration obtained from the hub of the balloon guide catheter. A gentle control arteriogram demonstrated no change in the extracranial or intracranial circulation. Over a 0.014 inch standard Synchro micro guidewire, a 150 cm 021 Trevo ProVue microcatheter was advanced to the distal end of balloon guide catheter. The micro guidewire was then gently manipulated with a torque device across the tight stenosis and advanced with microcatheter to the petrous horizontal segment. The guidewire was removed. Good aspiration obtained from the microcatheter. Microcatheter was exchanged for a 014 inch Softip 300 cm exchange micro guidewire with the wire having a J configuration at the distal end. A gentle control arteriogram performed through the balloon guide catheter demonstrated patency of the right internal carotid artery to the intracranial compartment A 4 mm x 30 mm Viatrac 014  inch angioplasty balloon  catheter which had been prepped with 50% contrast and 50% heparinized saline infusion was then advanced using the rapid exchange technique and positioned across the tight stenosis. A control angioplasty was then performed using a micro inflation syringe device via micro tubing with the balloon being inflated just over 4 mm where it was maintained for approximately 25 seconds. Balloon was deflated and retrieved and removed. A control arteriogram performed through the balloon guide catheter in the right common carotid artery demonstrates significantly improved caliber and flow through the angioplastied segment. More distally no significant change was seen in the intracranial circulation with occluded right internal carotid artery at the terminus. At this time the combination of an 021 Trevo ProVue microcatheter inside of a 6 French 132 cm large-bore guide catheter was advanced over the exchange micro guidewire to the petrous segment. The exchange micro guidewire was replaced with a regular 014 inch Synchro standard micro guidewire with a J configuration. Using a torque device, gentle manipulation was then performed to advanced it through the occluded right internal carotid terminus into the right middle cerebral artery inferior division M2 M3 region followed by the microcatheter. The guidewire was removed. Good aspiration was obtained from the hub of the microcatheter. A gentle control arteriogram performed through the microcatheter demonstrated safe position of tip of the microcatheter. This was then connected to continuous heparinized saline infusion. A 4 mm x 40 mm Solitaire X retrieval device was then advanced to the distal end the microcatheter. The O ring on the delivery microcatheter was loosened. With slight forward gentle traction with the right hand on the delivery micro guidewire with the left hand the delivery microcatheter was retrieved unsheathing the device. Also the 6 French  large-bore catheter was advanced to engage the occluded right internal carotid artery terminus and extend into the proximal right M1 segment to complete absence of aspiration on the Penumbra aspiration device. With proximal flow arrest in the right internal carotid artery by inflating the balloon of the guide catheter, after 2-1/2 minutes, the combination of the retrieval device, catheter and the 6 French large-bore were catheter combination were retrieved and removed as aspiration was performed at the hub of the Walrus balloon guide catheter. Following removal of the combination and reversal of flow arrest, a control arteriogram performed through the balloon guide catheter in the internal carotid artery demonstrated complete angiographic revascularization of the right anterior cerebral artery and the right middle cerebral artery and the distal right common carotid artery. The right posterior communicating artery remained patent. There was a small distal M3 M4 branch which demonstrated slow flow. A TICI 2C revascularization had been achieved. The balloon guide catheter was now retrieved more proximally just proximal to the right common carotid bifurcation. Measurements were performed of the right internal carotid artery just distal to the high-grade stenosis and also the right common carotid artery. It was decided to use a 6/8 mm x 40 mm Xact stent. An exchange 014 inch 300 cm Transend Softip exchange micro guidewire was positioned in the horizontal petrous segment and the delivery microcatheter was retrieved and removed as mentioned above. Using the rapid exchange technique, the delivery apparatus with the stent was advanced and positioned across the angioplastied segment of the right internal carotid artery. The stent was then deployed in the usual fashion without any difficulty. The delivery apparatus was then retrieved and removed. A control arteriogram performed through the balloon guide catheter in the right  common carotid artery demonstrated excellent apposition and flow through the  stented segment. There was a mild circumferential waist. However, further runs demonstrated expansion of this. Flow continued to be patent in the right external carotid artery. Control arteriograms were then performed at 15, 30 and 50 minutes post deployment of the stent. Due to the wide patency of the angioplastied segment with the stent it was decided not to do a post stent angioplasty. About 20 minutes post stent deployment, there was a mild irregularity noted at the site of the previous angioplasty. Prior to the angioplasty, the patient was loaded with 81 mg of aspirin, and 180 mg of Brilinta via an orogastric tube. Also following the angioplasty, the patient was given a total of 7.5 mg of intra-arterial Integrilin. Despite this irregular filling defects were seen at the site of the angioplasty following stent placement probably representing platelet aggregation. This prompted the use of a total of 2 mg of Aggrastat given in 40 mL intra-arterially. Control arteriograms performed 10 minutes following the completion of Aggrastat demonstrated improved caliber with decrease in the irregular filling defects probably representing inhibition of the platelet formation. A final control arteriogram performed through the right common carotid artery demonstrated excellent flow through the angioplastied and stented segment of the right internal carotid proximally. More distally wide patency was seen of the right internal carotid artery, the right posterior communicating artery, the right middle cerebral artery and the right anterior cerebral artery. The balloon guide catheter was retrieved and removed. Manual pressure was held at the right common femoral puncture site over about 20 minutes. Distal pulses remained palpable in the dorsalis pedis, and the posterior tibial regions. A CT angiogram performed demonstrated faint contrast stain in the  putamen on the right side and to a lesser degree the caudate nucleus. No masses or midline shift was seen. The patient was then extubated. Upon recovery, the patient moved her right side freely. She was able to slightly bend her left knee. She was then transferred to the neuro ICU for post thrombectomy management. Throughout the procedure, the patient's blood pressure and neurological status remained stable. No evidence of extravasation of contrast was seen. IMPRESSION: Status post endovascular revascularization of occluded right internal carotid artery terminus, right middle cerebral and right anterior cerebral A1 segment proximally with 1 pass using the 4 mm x 40 mm Solitaire X retrieval device, and Penumbra aspiration achieving a TICI 2C revascularization. Status post endovascular stent assisted angioplasty of symptomatic severe proximal right ICA stenosis. PLAN: Follow-up in the clinic 4 weeks post discharge. Electronically Signed   By: Luanne Bras M.D.   On: 03/03/2019 09:58   Ct Cerebral Perfusion W Contrast  Result Date: 03/02/2019 CLINICAL DATA:  Stroke code.  Left-sided weakness EXAM: CT ANGIOGRAPHY HEAD AND NECK CT PERFUSION BRAIN TECHNIQUE: Multidetector CT imaging of the head and neck was performed using the standard protocol during bolus administration of intravenous contrast. Multiplanar CT image reconstructions and MIPs were obtained to evaluate the vascular anatomy. Carotid stenosis measurements (when applicable) are obtained utilizing NASCET criteria, using the distal internal carotid diameter as the denominator. Multiphase CT imaging of the brain was performed following IV bolus contrast injection. Subsequent parametric perfusion maps were calculated using RAPID software. CONTRAST:  165mL OMNIPAQUE IOHEXOL 350 MG/ML SOLN COMPARISON:  Noncontrast head CT performed earlier the same day. FINDINGS: CTA NECK FINDINGS Aortic arch: Standard branching. Right carotid system: CCA widely patent.  Abnormal hypodensity within the origin of the right ICA and within the right carotid bulb, likely reflecting a combination of soft plaque and  adherent thrombus with resultant high-grade stenosis. Distal to this, the right ICA is asymmetrically small in caliber within the neck, but patent. Left carotid system: CCA widely patent. Mild soft plaque at the carotid bifurcation and within the proximal ICA without significant stenosis of the proximal ICA (50% or greater). Distal to this, the left ICA is widely patent within the neck without stenosis. Vertebral arteries: Codominant. Streak artifact from a dense left-sided contrast bolus somewhat limits evaluation of the V1 left vertebral artery. Within this limitation, the bilateral vertebral arteries are patent throughout the neck without significant stenosis (50% or greater). Skeleton: No acute bony abnormality. Congenital C3-C4 vertebral body fusion Other neck: No soft tissue neck mass or pathologically enlarged cervical chain lymph nodes. 1.7 cm nodule within the right thyroid lobe. Upper chest: No consolidation within the visualized lung apices. Review of the MIP images confirms the above findings CTA HEAD FINDINGS Anterior circulation: The right internal carotid artery is patent through the siphon region. There is occlusive or near occlusive thrombus within the right ICA terminus with occlusive/near occlusive thrombus extending into the M1 right middle cerebral artery and A1 right anterior cerebral artery. There is some reconstitution of flow within proximal M2 and more distal right MCA branches. However, there are additional high-grade stenoses within proximal to mid right M2 branches, likely reflecting additional thrombus Reconstitution of flow within the distal A1 right ACA, likely from cross flow via the anterior communicating artery. The A1 left ACA is patent without significant stenosis. The A2 and more distal anterior cerebral arteries are patent. The  intracranial left internal carotid arteries patent without significant stenosis. The left middle cerebral artery is patent without significant proximal stenosis. No intracranial aneurysm is identified. Posterior circulation: The intracranial vertebral arteries are patent without significant stenosis, as is the basilar artery. The bilateral posterior cerebral arteries are patent without significant proximal stenosis. A sizable right posterior communicating artery is present and patent, although the direction of flow within this vessel is uncertain. Venous sinuses: Within limitations of contrast timing, no convincing thrombus. Anatomic variants: As described. Review of the MIP images confirms the above findings CT Brain Perfusion Findings: CBF (<30%) Volume: 8mL Perfusion (Tmax>6.0s) volume: 33mL Mismatch Volume: 64mL Infarction Location: Right MCA vascular territory IMPRESSION: CTA neck: 1. Prominent abnormal hypodensity within the origin of the right internal carotid artery and right carotid bulb, likely reflecting a combination of soft plaque and adherent thrombus. Resultant high-grade stenosis at this site. 2. Left common and internal carotid arteries patent within the neck without significant stenosis. 3. Bilateral vertebral arteries patent within the neck without significant stenosis. 4. 1.7 cm left thyroid lobe nodule. Nonemergent thyroid ultrasound recommended for further evaluation. CTA head: 1. Occlusive/near occlusive thrombus within the right ICA terminus, extending into the M1 right middle cerebral artery and A1 right anterior cerebral artery. There is some reconstitution of flow within proximal M2 and more distal right MCA branches. However, there are additional high-grade stenoses within proximal to mid right M2 branches, likely reflecting additional thrombus. Opacification of the distal A1 right ACA and more distal right ACA, likely from cross flow via the anterior communicating artery. 2. No other  intracranial large vessel occlusion or proximal high-grade arterial stenosis. CT perfusion head: 5 mL core infarct within the right MCA vascular territory. 70 mL region of hypoperfused parenchyma within the right MCA vascular territory. Reported mismatch volume 65 mL. Electronically Signed   By: Kellie Simmering DO   On: 03/02/2019 09:31   Ir Percutaneous Art  Thrombectomy/infusion Intracranial Inc Diag Angio  Result Date: 03/06/2019 INDICATION: New onset right gaze deviation, left-sided hemiplegia and neglect. Occluded right internal carotid artery terminus, right middle cerebral artery and right anterior cerebral artery proximally. High-grade stenosis of the right internal carotid artery proximally. EXAM: 1. EMERGENT LARGE VESSEL OCCLUSION THROMBOLYSIS (anterior CIRCULATION) 2. Stent assisted angioplasty of acutely symptomatic right internal carotid artery severe stenosis. COMPARISON:  CT angiogram of the head and neck of March 02, 2019. MEDICATIONS: Vancomycin 1 g IV antibiotic was administered within 1 hour of the procedure. ANESTHESIA/SEDATION: General anesthesia. CONTRAST:  Isovue 300 approximately 85 cc. FLUOROSCOPY TIME:  Fluoroscopy Time: 37 minutes 54 seconds (1400 mGy). COMPLICATIONS: None immediate. TECHNIQUE: Following a full explanation of the procedure along with the potential associated complications, an informed witnessed consent was obtained from the patient. The risks of intracranial hemorrhage of 10%, worsening neurological deficit, ventilator dependency, death and inability to revascularize were all reviewed in detail with the patient. The patient was then put under general anesthesia by the Department of Anesthesiology at [facility]. The right groin was prepped and draped in the usual sterile fashion. Thereafter using modified Seldinger technique, transfemoral access into the right common femoral artery was obtained without difficulty. Over a 0.035 inch guidewire a 5 French Pinnacle  sheath was inserted. Through this, and also over a 0.035 inch guidewire a 5 [place] JB 1 catheter was advanced to the aortic arch region and selectively positioned in the innominate artery and the right common carotid artery. FINDINGS: The innominate artery angiogram demonstrates the right subclavian artery and the right common carotid artery proximally to be widely patent. The right vertebral artery origin is widely patent. The vessel is seen to opacify to the cranial skull base. The opacified visualized right vertebrobasilar junction, the basilar artery, the posterior cerebral arteries, the superior cerebellar arteries are grossly patent into the delayed arterial phase. The right common carotid arteriogram demonstrates the right external carotid artery and its major branches to be widely patent. The right internal carotid artery at the bulb demonstrates severe pre occlusive stenosis secondary to a smooth atherosclerotic plaque projecting anteriorly. No evidence of ulcerations or of intraluminal filling defects is seen. More distally the right internal carotid artery is seen to opacify to the cranial skull base. Wide patency is seen of the petrous and proximal cavernous segments. Distal to the right posterior communicating artery, there is complete angiographic occlusion with filling defects within the right internal carotid artery terminus with minute contrast seen in the anterior cerebral artery proximally but none in the right middle cerebral artery. The right posterior communicating artery is seen opacifying the right posterior cerebral artery distribution. PROCEDURE: The diagnostic JB 1 catheter in the right common carotid artery was exchanged over a 0.035 inch 300 cm Rosen exchange guidewire for an 8 [place] Pinnacle sheath in the right groin. This was then connected to continuous heparinized saline infusion. Over the exchange guidewire, an 087 Walrus balloon guide catheter which had been prepped with 50%  contrast and 50% heparinized saline infusion was advanced and positioned just proximal to the right common carotid bifurcation. The guidewire was removed. Good aspiration obtained from the hub of the balloon guide catheter. A gentle control arteriogram demonstrated no change in the extracranial or intracranial circulation. Over a 0.014 inch standard Synchro micro guidewire, a 150 cm 021 Trevo ProVue microcatheter was advanced to the distal end of balloon guide catheter. The micro guidewire was then gently manipulated with a torque device across the tight  stenosis and advanced with microcatheter to the petrous horizontal segment. The guidewire was removed. Good aspiration obtained from the microcatheter. Microcatheter was exchanged for a 014 inch Softip 300 cm exchange micro guidewire with the wire having a J configuration at the distal end. A gentle control arteriogram performed through the balloon guide catheter demonstrated patency of the right internal carotid artery to the intracranial compartment A 4 mm x 30 mm Viatrac 014 inch angioplasty balloon catheter which had been prepped with 50% contrast and 50% heparinized saline infusion was then advanced using the rapid exchange technique and positioned across the tight stenosis. A control angioplasty was then performed using a micro inflation syringe device via micro tubing with the balloon being inflated just over 4 mm where it was maintained for approximately 25 seconds. Balloon was deflated and retrieved and removed. A control arteriogram performed through the balloon guide catheter in the right common carotid artery demonstrates significantly improved caliber and flow through the angioplastied segment. More distally no significant change was seen in the intracranial circulation with occluded right internal carotid artery at the terminus. At this time the combination of an 021 Trevo ProVue microcatheter inside of a 6 French 132 cm large-bore guide catheter was  advanced over the exchange micro guidewire to the petrous segment. The exchange micro guidewire was replaced with a regular 014 inch Synchro standard micro guidewire with a J configuration. Using a torque device, gentle manipulation was then performed to advanced it through the occluded right internal carotid terminus into the right middle cerebral artery inferior division M2 M3 region followed by the microcatheter. The guidewire was removed. Good aspiration was obtained from the hub of the microcatheter. A gentle control arteriogram performed through the microcatheter demonstrated safe position of tip of the microcatheter. This was then connected to continuous heparinized saline infusion. A 4 mm x 40 mm Solitaire X retrieval device was then advanced to the distal end the microcatheter. The O ring on the delivery microcatheter was loosened. With slight forward gentle traction with the right hand on the delivery micro guidewire with the left hand the delivery microcatheter was retrieved unsheathing the device. Also the 6 French large-bore catheter was advanced to engage the occluded right internal carotid artery terminus and extend into the proximal right M1 segment to complete absence of aspiration on the Penumbra aspiration device. With proximal flow arrest in the right internal carotid artery by inflating the balloon of the guide catheter, after 2-1/2 minutes, the combination of the retrieval device, catheter and the 6 French large-bore were catheter combination were retrieved and removed as aspiration was performed at the hub of the Walrus balloon guide catheter. Following removal of the combination and reversal of flow arrest, a control arteriogram performed through the balloon guide catheter in the internal carotid artery demonstrated complete angiographic revascularization of the right anterior cerebral artery and the right middle cerebral artery and the distal right common carotid artery. The right posterior  communicating artery remained patent. There was a small distal M3 M4 branch which demonstrated slow flow. A TICI 2C revascularization had been achieved. The balloon guide catheter was now retrieved more proximally just proximal to the right common carotid bifurcation. Measurements were performed of the right internal carotid artery just distal to the high-grade stenosis and also the right common carotid artery. It was decided to use a 6/8 mm x 40 mm Xact stent. An exchange 014 inch 300 cm Transend Softip exchange micro guidewire was positioned in the horizontal petrous segment and  the delivery microcatheter was retrieved and removed as mentioned above. Using the rapid exchange technique, the delivery apparatus with the stent was advanced and positioned across the angioplastied segment of the right internal carotid artery. The stent was then deployed in the usual fashion without any difficulty. The delivery apparatus was then retrieved and removed. A control arteriogram performed through the balloon guide catheter in the right common carotid artery demonstrated excellent apposition and flow through the stented segment. There was a mild circumferential waist. However, further runs demonstrated expansion of this. Flow continued to be patent in the right external carotid artery. Control arteriograms were then performed at 15, 30 and 50 minutes post deployment of the stent. Due to the wide patency of the angioplastied segment with the stent it was decided not to do a post stent angioplasty. About 20 minutes post stent deployment, there was a mild irregularity noted at the site of the previous angioplasty. Prior to the angioplasty, the patient was loaded with 81 mg of aspirin, and 180 mg of Brilinta via an orogastric tube. Also following the angioplasty, the patient was given a total of 7.5 mg of intra-arterial Integrilin. Despite this irregular filling defects were seen at the site of the angioplasty following stent  placement probably representing platelet aggregation. This prompted the use of a total of 2 mg of Aggrastat given in 40 mL intra-arterially. Control arteriograms performed 10 minutes following the completion of Aggrastat demonstrated improved caliber with decrease in the irregular filling defects probably representing inhibition of the platelet formation. A final control arteriogram performed through the right common carotid artery demonstrated excellent flow through the angioplastied and stented segment of the right internal carotid proximally. More distally wide patency was seen of the right internal carotid artery, the right posterior communicating artery, the right middle cerebral artery and the right anterior cerebral artery. The balloon guide catheter was retrieved and removed. Manual pressure was held at the right common femoral puncture site over about 20 minutes. Distal pulses remained palpable in the dorsalis pedis, and the posterior tibial regions. A CT angiogram performed demonstrated faint contrast stain in the putamen on the right side and to a lesser degree the caudate nucleus. No masses or midline shift was seen. The patient was then extubated. Upon recovery, the patient moved her right side freely. She was able to slightly bend her left knee. She was then transferred to the neuro ICU for post thrombectomy management. Throughout the procedure, the patient's blood pressure and neurological status remained stable. No evidence of extravasation of contrast was seen. IMPRESSION: Status post endovascular revascularization of occluded right internal carotid artery terminus, right middle cerebral and right anterior cerebral A1 segment proximally with 1 pass using the 4 mm x 40 mm Solitaire X retrieval device, and Penumbra aspiration achieving a TICI 2C revascularization. Status post endovascular stent assisted angioplasty of symptomatic severe proximal right ICA stenosis. PLAN: Follow-up in the clinic 4  weeks post discharge. Electronically Signed   By: Luanne Bras M.D.   On: 03/03/2019 09:58   Ct Head Code Stroke Wo Contrast  Result Date: 03/02/2019 CLINICAL DATA:  Code stroke. Altered level of consciousness (LOC), unexplained. Last known normal 2300, left-sided weakness. EXAM: CT HEAD WITHOUT CONTRAST TECHNIQUE: Contiguous axial images were obtained from the base of the skull through the vertex without intravenous contrast. COMPARISON:  Brain MRI 12/19/2015, head CT 12/19/2015 FINDINGS: Brain: Moderate to severe motion degradation, significantly limiting evaluation. No acute intracranial hemorrhage or acute demarcated cortical infarct definitively identified.  No intracranial mass identified. No midline shift. No extra-axial fluid collection identified. Vascular: No hyperdense vessel identified Skull: No calvarial fracture or suspicious osseous lesion identified. Sinuses/Orbits: Globes unremarkable within described limitations. Ethmoid sinus mucosal thickening. No significant mastoid effusion. ASPECTS [facility] Stroke Program Early CT Score) difficult to report with confidence given degree of motion degradation. These results were communicated to Dr. Leonel Ramsay At 8:36 amon 11/23/2020by text page via the [facility] messaging system. IMPRESSION: 1. Moderate to severe motion degradation significantly limits evaluation. 2. No acute intracranial hemorrhage or demarcated cortical infarction definitively identified. Electronically Signed   By: Kellie Simmering DO   On: 03/02/2019 08:36   Ir Angio Vertebral Sel Subclavian Innominate Uni R Mod Sed  Result Date: 03/06/2019 INDICATION: New onset right gaze deviation, left-sided hemiplegia and neglect. Occluded right internal carotid artery terminus, right middle cerebral artery and right anterior cerebral artery proximally. High-grade stenosis of the right internal carotid artery proximally. EXAM: 1. EMERGENT LARGE VESSEL OCCLUSION THROMBOLYSIS (anterior CIRCULATION)  2. Stent assisted angioplasty of acutely symptomatic right internal carotid artery severe stenosis. COMPARISON:  CT angiogram of the head and neck of March 02, 2019. MEDICATIONS: Vancomycin 1 g IV antibiotic was administered within 1 hour of the procedure. ANESTHESIA/SEDATION: General anesthesia. CONTRAST:  Isovue 300 approximately 85 cc. FLUOROSCOPY TIME:  Fluoroscopy Time: 37 minutes 54 seconds (1400 mGy). COMPLICATIONS: None immediate. TECHNIQUE: Following a full explanation of the procedure along with the potential associated complications, an informed witnessed consent was obtained from the patient. The risks of intracranial hemorrhage of 10%, worsening neurological deficit, ventilator dependency, death and inability to revascularize were all reviewed in detail with the patient. The patient was then put under general anesthesia by the Department of Anesthesiology at [facility]. The right groin was prepped and draped in the usual sterile fashion. Thereafter using modified Seldinger technique, transfemoral access into the right common femoral artery was obtained without difficulty. Over a 0.035 inch guidewire a 5 French Pinnacle sheath was inserted. Through this, and also over a 0.035 inch guidewire a 5 [place] JB 1 catheter was advanced to the aortic arch region and selectively positioned in the innominate artery and the right common carotid artery. FINDINGS: The innominate artery angiogram demonstrates the right subclavian artery and the right common carotid artery proximally to be widely patent. The right vertebral artery origin is widely patent. The vessel is seen to opacify to the cranial skull base. The opacified visualized right vertebrobasilar junction, the basilar artery, the posterior cerebral arteries, the superior cerebellar arteries are grossly patent into the delayed arterial phase. The right common carotid arteriogram demonstrates the right external carotid artery and its major branches  to be widely patent. The right internal carotid artery at the bulb demonstrates severe pre occlusive stenosis secondary to a smooth atherosclerotic plaque projecting anteriorly. No evidence of ulcerations or of intraluminal filling defects is seen. More distally the right internal carotid artery is seen to opacify to the cranial skull base. Wide patency is seen of the petrous and proximal cavernous segments. Distal to the right posterior communicating artery, there is complete angiographic occlusion with filling defects within the right internal carotid artery terminus with minute contrast seen in the anterior cerebral artery proximally but none in the right middle cerebral artery. The right posterior communicating artery is seen opacifying the right posterior cerebral artery distribution. PROCEDURE: The diagnostic JB 1 catheter in the right common carotid artery was exchanged over a 0.035 inch 300 cm Rosen exchange guidewire for an 8  French Pinnacle sheath in the right groin. This was then connected to continuous heparinized saline infusion. Over the exchange guidewire, an 087 Walrus balloon guide catheter which had been prepped with 50% contrast and 50% heparinized saline infusion was advanced and positioned just proximal to the right common carotid bifurcation. The guidewire was removed. Good aspiration obtained from the hub of the balloon guide catheter. A gentle control arteriogram demonstrated no change in the extracranial or intracranial circulation. Over a 0.014 inch standard Synchro micro guidewire, a 150 cm 021 Trevo ProVue microcatheter was advanced to the distal end of balloon guide catheter. The micro guidewire was then gently manipulated with a torque device across the tight stenosis and advanced with microcatheter to the petrous horizontal segment. The guidewire was removed. Good aspiration obtained from the microcatheter. Microcatheter was exchanged for a 014 inch Softip 300 cm exchange micro  guidewire with the wire having a J configuration at the distal end. A gentle control arteriogram performed through the balloon guide catheter demonstrated patency of the right internal carotid artery to the intracranial compartment A 4 mm x 30 mm Viatrac 014 inch angioplasty balloon catheter which had been prepped with 50% contrast and 50% heparinized saline infusion was then advanced using the rapid exchange technique and positioned across the tight stenosis. A control angioplasty was then performed using a micro inflation syringe device via micro tubing with the balloon being inflated just over 4 mm where it was maintained for approximately 25 seconds. Balloon was deflated and retrieved and removed. A control arteriogram performed through the balloon guide catheter in the right common carotid artery demonstrates significantly improved caliber and flow through the angioplastied segment. More distally no significant change was seen in the intracranial circulation with occluded right internal carotid artery at the terminus. At this time the combination of an 021 Trevo ProVue microcatheter inside of a 6 French 132 cm large-bore guide catheter was advanced over the exchange micro guidewire to the petrous segment. The exchange micro guidewire was replaced with a regular 014 inch Synchro standard micro guidewire with a J configuration. Using a torque device, gentle manipulation was then performed to advanced it through the occluded right internal carotid terminus into the right middle cerebral artery inferior division M2 M3 region followed by the microcatheter. The guidewire was removed. Good aspiration was obtained from the hub of the microcatheter. A gentle control arteriogram performed through the microcatheter demonstrated safe position of tip of the microcatheter. This was then connected to continuous heparinized saline infusion. A 4 mm x 40 mm Solitaire X retrieval device was then advanced to the distal end the  microcatheter. The O ring on the delivery microcatheter was loosened. With slight forward gentle traction with the right hand on the delivery micro guidewire with the left hand the delivery microcatheter was retrieved unsheathing the device. Also the 6 French large-bore catheter was advanced to engage the occluded right internal carotid artery terminus and extend into the proximal right M1 segment to complete absence of aspiration on the Penumbra aspiration device. With proximal flow arrest in the right internal carotid artery by inflating the balloon of the guide catheter, after 2-1/2 minutes, the combination of the retrieval device, catheter and the 6 French large-bore were catheter combination were retrieved and removed as aspiration was performed at the hub of the Walrus balloon guide catheter. Following removal of the combination and reversal of flow arrest, a control arteriogram performed through the balloon guide catheter in the internal carotid artery demonstrated complete angiographic  revascularization of the right anterior cerebral artery and the right middle cerebral artery and the distal right common carotid artery. The right posterior communicating artery remained patent. There was a small distal M3 M4 branch which demonstrated slow flow. A TICI 2C revascularization had been achieved. The balloon guide catheter was now retrieved more proximally just proximal to the right common carotid bifurcation. Measurements were performed of the right internal carotid artery just distal to the high-grade stenosis and also the right common carotid artery. It was decided to use a 6/8 mm x 40 mm Xact stent. An exchange 014 inch 300 cm Transend Softip exchange micro guidewire was positioned in the horizontal petrous segment and the delivery microcatheter was retrieved and removed as mentioned above. Using the rapid exchange technique, the delivery apparatus with the stent was advanced and positioned across the  angioplastied segment of the right internal carotid artery. The stent was then deployed in the usual fashion without any difficulty. The delivery apparatus was then retrieved and removed. A control arteriogram performed through the balloon guide catheter in the right common carotid artery demonstrated excellent apposition and flow through the stented segment. There was a mild circumferential waist. However, further runs demonstrated expansion of this. Flow continued to be patent in the right external carotid artery. Control arteriograms were then performed at 15, 30 and 50 minutes post deployment of the stent. Due to the wide patency of the angioplastied segment with the stent it was decided not to do a post stent angioplasty. About 20 minutes post stent deployment, there was a mild irregularity noted at the site of the previous angioplasty. Prior to the angioplasty, the patient was loaded with 81 mg of aspirin, and 180 mg of Brilinta via an orogastric tube. Also following the angioplasty, the patient was given a total of 7.5 mg of intra-arterial Integrilin. Despite this irregular filling defects were seen at the site of the angioplasty following stent placement probably representing platelet aggregation. This prompted the use of a total of 2 mg of Aggrastat given in 40 mL intra-arterially. Control arteriograms performed 10 minutes following the completion of Aggrastat demonstrated improved caliber with decrease in the irregular filling defects probably representing inhibition of the platelet formation. A final control arteriogram performed through the right common carotid artery demonstrated excellent flow through the angioplastied and stented segment of the right internal carotid proximally. More distally wide patency was seen of the right internal carotid artery, the right posterior communicating artery, the right middle cerebral artery and the right anterior cerebral artery. The balloon guide catheter was  retrieved and removed. Manual pressure was held at the right common femoral puncture site over about 20 minutes. Distal pulses remained palpable in the dorsalis pedis, and the posterior tibial regions. A CT angiogram performed demonstrated faint contrast stain in the putamen on the right side and to a lesser degree the caudate nucleus. No masses or midline shift was seen. The patient was then extubated. Upon recovery, the patient moved her right side freely. She was able to slightly bend her left knee. She was then transferred to the neuro ICU for post thrombectomy management. Throughout the procedure, the patient's blood pressure and neurological status remained stable. No evidence of extravasation of contrast was seen. IMPRESSION: Status post endovascular revascularization of occluded right internal carotid artery terminus, right middle cerebral and right anterior cerebral A1 segment proximally with 1 pass using the 4 mm x 40 mm Solitaire X retrieval device, and Penumbra aspiration achieving a TICI 2C revascularization.  Status post endovascular stent assisted angioplasty of symptomatic severe proximal right ICA stenosis. PLAN: Follow-up in the clinic 4 weeks post discharge. Electronically Signed   By: Luanne Bras M.D.   On: 03/03/2019 09:58     Subjective: She is feeling well, report her BP is always low  Discharge Exam: Vitals:   03/09/19 0452 03/09/19 0806  BP: 138/60 (!) 95/59  Pulse: (!) 51 (!) 54  Resp: 18 18  Temp: (!) 97.5 F (36.4 C) 98 F (36.7 C)  SpO2: 99% 99%     General: Pt is alert, awake, not in acute distress Cardiovascular: RRR, S1/S2 +, no rubs, no gallops Respiratory: CTA bilaterally, no wheezing, no rhonchi Abdominal: Soft, NT, ND, bowel sounds + Extremities: no edema, no cyanosis    The results of significant diagnostics from this hospitalization (including imaging, microbiology, ancillary and laboratory) are listed below for reference.      Microbiology: Recent Results (from the past 240 hour(s))  SARS Coronavirus 2 by RT PCR (hospital order, performed in [facility] hospital lab) Nasopharyngeal Nasopharyngeal Swab     Status: None   Collection Time: 03/02/19  9:18 AM   Specimen: Nasopharyngeal Swab  Result Value Ref Range Status   SARS Coronavirus 2 NEGATIVE NEGATIVE Final    Comment: (NOTE) If result is NEGATIVE SARS-CoV-2 target nucleic acids are NOT DETECTED. The SARS-CoV-2 RNA is generally detectable in upper and lower  respiratory specimens during the acute phase of infection. The lowest  concentration of SARS-CoV-2 viral copies this assay can detect is 250  copies / mL. A negative result does not preclude SARS-CoV-2 infection  and should not be used as the sole basis for treatment or other  patient management decisions.  A negative result may occur with  improper specimen collection / handling, submission of specimen other  than nasopharyngeal swab, presence of viral mutation(s) within the  areas targeted by this assay, and inadequate number of viral copies  (<250 copies / mL). A negative result must be combined with clinical  observations, patient history, and epidemiological information. If result is POSITIVE SARS-CoV-2 target nucleic acids are DETECTED. The SARS-CoV-2 RNA is generally detectable in upper and lower  respiratory specimens dur ing the acute phase of infection.  Positive  results are indicative of active infection with SARS-CoV-2.  Clinical  correlation with patient history and other diagnostic information is  necessary to determine patient infection status.  Positive results do  not rule out bacterial infection or co-infection with other viruses. If result is PRESUMPTIVE POSTIVE SARS-CoV-2 nucleic acids MAY BE PRESENT.   A presumptive positive result was obtained on the submitted specimen  and confirmed on repeat testing.  While 2019 novel coronavirus  (SARS-CoV-2) nucleic acids may be  present in the submitted sample  additional confirmatory testing may be necessary for epidemiological  and / or clinical management purposes  to differentiate between  SARS-CoV-2 and other Sarbecovirus currently known to infect humans.  If clinically indicated additional testing with an alternate test  methodology [phone]) is advised. The SARS-CoV-2 RNA is generally  detectable in upper and lower respiratory sp ecimens during the acute  phase of infection. The expected result is Negative. Fact Sheet for Patients:  StrictlyIdeas.no Fact Sheet for Healthcare Providers: BankingDealers.co.za This test is not yet approved or cleared by the [place] FDA and has been authorized for detection and/or diagnosis of SARS-CoV-2 by FDA under an Emergency Use Authorization (EUA).  This EUA will remain in effect (meaning this test  can be used) for the duration of the COVID-19 declaration under Section 564(b)(1) of the Act, 21 U.S.C. section 360bbb-3(b)(1), unless the authorization is terminated or revoked sooner. Performed at [facility], [place] [address] [place], [place] [address]   MRSA PCR Screening     Status: None   Collection Time: 03/02/19 12:51 PM   Specimen: Nasal Mucosa; Nasopharyngeal  Result Value Ref Range Status   MRSA by PCR NEGATIVE NEGATIVE Final    Comment:        The GeneXpert MRSA Assay (FDA approved for NASAL specimens only), is one component of a comprehensive MRSA colonization surveillance program. It is not intended to diagnose MRSA infection nor to guide or monitor treatment for MRSA infections. Performed at [facility], [place] [address] [place], [place] [address]   Culture, Urine     Status: Abnormal   Collection Time: 03/03/19 12:00 PM   Specimen: Urine, Catheterized  Result Value Ref Range Status   Specimen Description URINE, CATHETERIZED  Final   Special Requests   Final    Normal Performed at  [facility], [place] [address], [place], [place] [address]    Culture 50,000 COLONIES/mL ESCHERICHIA COLI (A)  Final   Report Status 03/05/2019 FINAL  Final   Organism ID, Bacteria ESCHERICHIA COLI (A)  Final      Susceptibility   Escherichia coli - MIC*    AMPICILLIN 4 SENSITIVE Sensitive     CEFAZOLIN <=4 SENSITIVE Sensitive     CEFTRIAXONE <=1 SENSITIVE Sensitive     CIPROFLOXACIN <=0.25 SENSITIVE Sensitive     GENTAMICIN <=1 SENSITIVE Sensitive     IMIPENEM <=0.25 SENSITIVE Sensitive     NITROFURANTOIN <=16 SENSITIVE Sensitive     TRIMETH/SULFA <=20 SENSITIVE Sensitive     AMPICILLIN/SULBACTAM <=2 SENSITIVE Sensitive     PIP/TAZO <=4 SENSITIVE Sensitive     Extended ESBL NEGATIVE Sensitive     * 50,000 COLONIES/mL ESCHERICHIA COLI     Labs: BNP (last 3 results) No results for input(s): BNP in the last 8760 hours. Basic Metabolic Panel: Recent Labs  Lab 03/03/19 1453 03/04/19 0417 03/04/19 1612 03/05/19 0343 03/06/19 0312 03/07/19 0350 03/08/19 0507  NA 141 140  --  138 140 140 139  K 3.3* 3.6  --  3.4* 3.8 3.8 3.7  CL 113* 109  --  110 109 108 107  CO2 22 21*  --  19* 23 21* 22  GLUCOSE 141* 177*  --  116* 97 87 124*  BUN 8 10  --  9 6 8 8   CREATININE 0.61 0.59  --  0.64 0.49 0.54 0.47  CALCIUM 8.3* 8.7*  --  8.3* 8.3* 8.8* 8.8*  MG 1.6* 1.7 1.6*  --  1.7 1.9  --   PHOS 2.8 2.9 2.9  --   --   --   --    Liver Function Tests: Recent Labs  Lab 03/03/19 0846 03/06/19 0312 03/07/19 0350  AST 17 28 20   ALT 17 36 28  ALKPHOS 47 87 98  BILITOT 0.8 1.5* 1.1  PROT 5.9* 5.7* 6.1*  ALBUMIN 3.1* 2.7* 2.9*   No results for input(s): LIPASE, AMYLASE in the last 168 hours. No results for input(s): AMMONIA in the last 168 hours. CBC: Recent Labs  Lab 03/03/19 1453 03/05/19 0343 03/06/19 0312 03/07/19 0350 03/08/19 0507  WBC 20.0* 15.4* 12.6* 12.6* 10.8*  NEUTROABS 15.4*  --   --  9.1* 7.2  HGB 12.4  11.8* 11.5* 12.0 12.0  HCT 37.3 35.5* 34.4* 36.1 35.3*   MCV 89.4 88.1 88.7 87.8 86.7  PLT 222 202 216 238 278   Cardiac Enzymes: Recent Labs  Lab 03/02/19 1255  CKTOTAL 65  CKMB 1.8   BNP: Invalid input(s): POCBNP CBG: Recent Labs  Lab 03/08/19 1617 03/08/19 1926 03/08/19 2337 03/09/19 0451 03/09/19 0804  GLUCAP 197* 166* 125* 113* 159*   D-Dimer No results for input(s): DDIMER in the last 72 hours. Hgb A1c No results for input(s): HGBA1C in the last 72 hours. Lipid Profile No results for input(s): CHOL, HDL, LDLCALC, TRIG, CHOLHDL, LDLDIRECT in the last 72 hours. Thyroid function studies No results for input(s): TSH, T4TOTAL, T3FREE, THYROIDAB in the last 72 hours.  Invalid input(s): FREET3 Anemia work up No results for input(s): VITAMINB12, FOLATE, FERRITIN, TIBC, IRON, RETICCTPCT in the last 72 hours. Urinalysis    Component Value Date/Time   COLORURINE YELLOW 03/02/2019 1240   APPEARANCEUR CLEAR 03/02/2019 1240   LABSPEC >1.046 (H) 03/02/2019 1240   PHURINE 5.0 03/02/2019 1240   GLUCOSEU >=500 (A) 03/02/2019 1240   HGBUR NEGATIVE 03/02/2019 1240   HGBUR negative 12/30/2006 1457   BILIRUBINUR NEGATIVE 03/02/2019 1240   BILIRUBINUR negative 12/06/2015 1204   BILIRUBINUR neg 10/24/2014 1332   KETONESUR 5 (A) 03/02/2019 1240   PROTEINUR NEGATIVE 03/02/2019 1240   UROBILINOGEN 0.2 12/06/2015 1204   UROBILINOGEN 0.2 02/04/2015 1413   NITRITE NEGATIVE 03/02/2019 1240   LEUKOCYTESUR MODERATE (A) 03/02/2019 1240   Sepsis Labs Invalid input(s): PROCALCITONIN,  WBC,  LACTICIDVEN Microbiology Recent Results (from the past 240 hour(s))  SARS Coronavirus 2 by RT PCR (hospital order, performed in [place] hospital lab) Nasopharyngeal Nasopharyngeal Swab     Status: None   Collection Time: 03/02/19  9:18 AM   Specimen: Nasopharyngeal Swab  Result Value Ref Range Status   SARS Coronavirus 2 NEGATIVE NEGATIVE Final    Comment: (NOTE) If result is NEGATIVE SARS-CoV-2 target nucleic acids are NOT DETECTED. The  SARS-CoV-2 RNA is generally detectable in upper and lower  respiratory specimens during the acute phase of infection. The lowest  concentration of SARS-CoV-2 viral copies this assay can detect is 250  copies / mL. A negative result does not preclude SARS-CoV-2 infection  and should not be used as the sole basis for treatment or other  patient management decisions.  A negative result may occur with  improper specimen collection / handling, submission of specimen other  than nasopharyngeal swab, presence of viral mutation(s) within the  areas targeted by this assay, and inadequate number of viral copies  (<250 copies / mL). A negative result must be combined with clinical  observations, patient history, and epidemiological information. If result is POSITIVE SARS-CoV-2 target nucleic acids are DETECTED. The SARS-CoV-2 RNA is generally detectable in upper and lower  respiratory specimens dur ing the acute phase of infection.  Positive  results are indicative of active infection with SARS-CoV-2.  Clinical  correlation with patient history and other diagnostic information is  necessary to determine patient infection status.  Positive results do  not rule out bacterial infection or co-infection with other viruses. If result is PRESUMPTIVE POSTIVE SARS-CoV-2 nucleic acids MAY BE PRESENT.   A presumptive positive result was obtained on the submitted specimen  and confirmed on repeat testing.  While 2019 novel coronavirus  (SARS-CoV-2) nucleic acids may be present in the submitted sample  additional confirmatory testing may be necessary for epidemiological  and / or clinical  management purposes  to differentiate between  SARS-CoV-2 and other Sarbecovirus currently known to infect humans.  If clinically indicated additional testing with an alternate test  methodology [phone]) is advised. The SARS-CoV-2 RNA is generally  detectable in upper and lower respiratory sp ecimens during the acute   phase of infection. The expected result is Negative. Fact Sheet for Patients:  StrictlyIdeas.no Fact Sheet for Healthcare Providers: BankingDealers.co.za This test is not yet approved or cleared by the [place] FDA and has been authorized for detection and/or diagnosis of SARS-CoV-2 by FDA under an Emergency Use Authorization (EUA).  This EUA will remain in effect (meaning this test can be used) for the duration of the COVID-19 declaration under Section 564(b)(1) of the Act, 21 U.S.C. section 360bbb-3(b)(1), unless the authorization is terminated or revoked sooner. Performed at [facility], [place] [address] [place], [place] [address]   MRSA PCR Screening     Status: None   Collection Time: 03/02/19 12:51 PM   Specimen: Nasal Mucosa; Nasopharyngeal  Result Value Ref Range Status   MRSA by PCR NEGATIVE NEGATIVE Final    Comment:        The GeneXpert MRSA Assay (FDA approved for NASAL specimens only), is one component of a comprehensive MRSA colonization surveillance program. It is not intended to diagnose MRSA infection nor to guide or monitor treatment for MRSA infections. Performed at [facility], [place] [address] [place], [place] [address]   Culture, Urine     Status: Abnormal   Collection Time: 03/03/19 12:00 PM   Specimen: Urine, Catheterized  Result Value Ref Range Status   Specimen Description URINE, CATHETERIZED  Final   Special Requests   Final    Normal Performed at [facility], [place] [address], [place], [place] [address]    Culture 50,000 COLONIES/mL ESCHERICHIA COLI (A)  Final   Report Status 03/05/2019 FINAL  Final   Organism ID, Bacteria ESCHERICHIA COLI (A)  Final      Susceptibility   Escherichia coli - MIC*    AMPICILLIN 4 SENSITIVE Sensitive     CEFAZOLIN <=4 SENSITIVE Sensitive     CEFTRIAXONE <=1 SENSITIVE Sensitive     CIPROFLOXACIN <=0.25 SENSITIVE Sensitive     GENTAMICIN <=1  SENSITIVE Sensitive     IMIPENEM <=0.25 SENSITIVE Sensitive     NITROFURANTOIN <=16 SENSITIVE Sensitive     TRIMETH/SULFA <=20 SENSITIVE Sensitive     AMPICILLIN/SULBACTAM <=2 SENSITIVE Sensitive     PIP/TAZO <=4 SENSITIVE Sensitive     Extended ESBL NEGATIVE Sensitive     * 50,000 COLONIES/mL ESCHERICHIA COLI     Time coordinating discharge: 40 minutes  SIGNED:   Elmarie Shiley, MD  [facility]

## 2019-03-09 NOTE — TOC Transition Note (Signed)
Transition of Care [facility]) - CM/SW Discharge Note   Patient Details  Name: Ann Lopez MRN: [id number] Date of Birth: [birth year]  Transition of [facility]) CM/SW Contact:  Pollie Friar, RN Phone Number: 03/09/2019, 1:00 PM   Clinical Narrative:    Pt is discharging to CIR today. CM signing off.    Final next level of care: IP Rehab Facility Barriers to Discharge: No Barriers Identified   Patient Goals and CMS Choice        Discharge Placement                       Discharge Plan and Services                                     Social Determinants of Health (SDOH) Interventions     Readmission Risk Interventions No flowsheet data found.

## 2019-03-09 NOTE — H&P (Signed)
Physical Medicine and Rehabilitation Admission H&P        Chief Complaint  Patient presents with   Stroke with functional deficits      HPI: Ann Lopez is a 48 year old female with history of T2DM, HTN, gastric sleeve, breast cancer s/p bilateral mastectomy with TAH/BSO and post chemo memory deficits, tobacco use, migraines, back pain who was admitted on 03/02/19 after fall from the commode and found to have left sided weakness with left neglect and right gaze preference. UDS positive with opioids and amphetamines.  CTA/P head revealed near occlusive thrombus within R-ICA terminus extending into  M1 R-MCA with A1 R-ACA with core infarct in R-MCA and large penumbra as well as incidental finding of left thyroid nodule. She was taken for cerebral angio with emergent thrombectomy and complete revascularization of occluded R- ICA terminus, R-MCA and R-ACA with stent assisted angioplasty of symptomatic proximal R-ICA stenosis by DR. Deveshwar. Post procedure with hypotension felt to be due to carotid manipulation as well as decrease in LOC with increase in LLE tone. EEG showed diffuse encephalopathy and repeat CT head showed some mass effect on right lateral ventricle and was negative for hemorrhage.     She tolerated extubation without difficulty but had bouts of agitation alternating with lethargy. 2D echo showed EF 60-65% with no LVH and no wall abnormality.  Most recent CT head 11/25 showed residual hyperdensity within infarcted right lentiform nucleus c/w resolving confluent petechial hemorrhage and persistent partial effacement of right lateral ventricle. Dr. Erlinda Hong felt that stroke embolic due to large vessel stenosis but PAF not fully ruled out--30 day cardiac event monitor recommended on outpatient basis. Neurology recommending SBP goal 120-140--hypotensive today with SBP in 90's therefore treated with fluid bolus.   Patient with resultant left facial droop with cognitive deficits, left sided  weakness and right gaze preference with left neglect affecting mobility and ADLs. CIR recommended due to functional decline.      Review of Systems  Constitutional: Negative for chills and fever.  HENT: Negative for hearing loss and tinnitus.   Eyes: Negative for blurred vision and double vision.  Respiratory: Negative for cough and shortness of breath.   Cardiovascular: Negative for chest pain and palpitations.  Gastrointestinal: Positive for constipation. Negative for heartburn.  Genitourinary: Positive for dysuria.  Musculoskeletal: Positive for back pain and myalgias.  Skin: Negative for itching and rash.  Neurological: Positive for sensory change (left foot) and focal weakness. Negative for dizziness and headaches.  Psychiatric/Behavioral: Positive for depression. The patient has insomnia.           Past Medical History:  Diagnosis Date   Allergy     Anxiety     Asthma     Back pain     Breast cancer ([place])     Cancer ([place])     Depression     Diabetes mellitus     Headache     Hyperlipemia     Osteopenia     Osteoporosis      osteopenia           Past Surgical History:  Procedure Laterality Date   ABDOMINAL HYSTERECTOMY        B oophorectomy for BRCA1 gene   BREAST SURGERY       CESAREAN SECTION       INCISION AND DRAINAGE ABSCESS Left 11/14/2012    Procedure: INCISION AND DRAINAGE ABSCESS LEFT GROIN;  Surgeon: Jamesetta So, MD;  Location:  [facility];  Service: General;  Laterality: Left;   IR ANGIO VERTEBRAL SEL SUBCLAVIAN INNOMINATE UNI R MOD SED   03/02/2019   IR CT HEAD LTD   03/02/2019   IR INTRAVSC STENT CERV CAROTID W/O EMB-PROT MOD SED INC ANGIO   03/02/2019   IR PERCUTANEOUS ART THROMBECTOMY/INFUSION INTRACRANIAL INC DIAG ANGIO   03/02/2019   LYMPHADENECTOMY       MASTECTOMY Bilateral     RADIOLOGY WITH ANESTHESIA N/A 03/02/2019    Procedure: IR WITH ANESTHESIA;  Surgeon: Luanne Bras, MD;  Location: [facility];  Service: Radiology;   Laterality: N/A;           Family History  Problem Relation Age of Onset   Cancer Mother 8        breast cancer   Hypertension Sister     Hypothyroidism Sister     Cancer Sister     Hypothyroidism Brother     Cancer Maternal Grandmother 7        breast cancer   Cancer Maternal Grandfather 22        pancreatic cancer      Social History:  Lives with 79 year old son. Helps with grandchild during the day. She reports that she has been smoking cigarettes. She has been smoking about 1- 1.5 PPD.  She has never used smokeless tobacco. She reports has a mixed drink now and again. She does not use illicit drugs.           Allergies  Allergen Reactions   Azithromycin Other (See Comments)      Unknown reaction   Morphine And Related Other (See Comments)      Unknown reaction - Can tolerate hydrocodone and dilaudid without side effects   Nitrofurantoin Other (See Comments)      Unknown reaction   Penicillins Hives and Itching      Has patient had a PCN reaction causing immediate rash, facial/tongue/throat swelling, SOB or lightheadedness with hypotension: Yes Has patient had a PCN reaction causing severe rash involving mucus membranes or skin necrosis: No Has patient had a PCN reaction that required hospitalization No Has patient had a PCN reaction occurring within the last 10 years: No If all of the above answers are "NO", then may proceed with Cephalosporin use.     Sulfonamide Derivatives Hives            Medications Prior to Admission  Medication Sig Dispense Refill   HYDROcodone-acetaminophen (NORCO) 10-325 MG tablet Take 1 tablet by mouth every 6 (six) hours as needed.       hydrOXYzine (ATARAX/VISTARIL) 10 MG tablet Take 10 mg by mouth 3 (three) times daily.       albuterol (PROVENTIL HFA;VENTOLIN HFA) 108 (90 Base) MCG/ACT inhaler Inhale 1 puff into the lungs every 6 (six) hours as needed for wheezing or shortness of breath. 1 Inhaler 6   albuterol  (PROVENTIL) (2.5 MG/3ML) 0.083% nebulizer solution Take 3 mLs (2.5 mg total) by nebulization every 6 (six) hours as needed for wheezing or shortness of breath. 75 mL 12   aspirin EC 81 MG tablet Take 1 tablet (81 mg total) by mouth daily. 30 tablet 0   canagliflozin (INVOKANA) 300 MG TABS tablet Take 300 mg by mouth daily before breakfast.       Choline Fenofibrate (FENOFIBRIC ACID) 135 MG CPDR Take 1 capsule by mouth daily.       clonazePAM (KLONOPIN) 0.5 MG tablet Take 0.25 mg by mouth 2 (two) times  daily as needed for anxiety.        divalproex (DEPAKOTE ER) 250 MG 24 hr tablet Take 250 mg by mouth 2 (two) times daily.       DULoxetine (CYMBALTA) 60 MG capsule Take 2 capsules (120 mg total) by mouth daily. (Patient taking differently: Take 60 mg by mouth daily. ) 180 capsule 3   fenofibrate (TRICOR) 145 MG tablet Take 1 tablet (145 mg total) by mouth daily. 90 tablet 1   furosemide (LASIX) 20 MG tablet Take 20 mg by mouth.       glucose blood (CHOICE DM FORA G20 TEST STRIPS) test strip Use as instructed 100 each 12   ibuprofen (ADVIL,MOTRIN) 200 MG tablet Take 800 mg by mouth every 6 (six) hours as needed for fever. Reported on 07/28/2015       Insulin Glargine (LANTUS SOLOSTAR) 100 UNIT/ML Solostar Pen Inject 20 Units into the skin daily at 10 pm. 5 pen PRN   Insulin Pen Needle 32G X 4 MM MISC 1 application by Does not apply route daily. 100 each 6   Lancets (ONETOUCH ULTRASOFT) lancets Use as instructed; check sugar once daily 100 each 12   mometasone (NASONEX) 50 MCG/ACT nasal spray Place 2 sprays into the nose daily. 17 g 12   naproxen (NAPROSYN) 500 MG tablet Take 1 tablet (500 mg total) by mouth 2 (two) times daily. 20 tablet 0   omeprazole (PRILOSEC) 40 MG capsule Take 1 capsule (40 mg total) by mouth daily. 30 capsule 5      Drug Regimen Review  Drug regimen was reviewed and remains appropriate with no significant issues identified   Home: Home Living Family/patient  expects to be discharged to:: Private residence Living Arrangements: Spouse/significant other, Children Available Help at Discharge: Available 24 hours/day Type of Home: Mobile home Home Access: Stairs to enter [employer] of Steps: 3 Entrance Stairs-Rails: Can reach both Home Layout: One level Additional Comments: information per chart from PT session. pt does not sustain attention to questions. pt does report she needs to get out of here to work to pay her mortage she cant loose her house  Lives With: Son(75 year old)   Functional History: Prior Function Level of Independence: Independent   Functional Status:  Mobility: Bed Mobility Overal bed mobility: Needs Assistance Bed Mobility: Rolling, Sidelying to Sit, Sit to Sidelying Rolling: Mod assist Sidelying to sit: Mod assist Sit to sidelying: Total assist, +2 for physical assistance, +2 for safety/equipment General bed mobility comments: pt received in recliner after bathing with nursing Transfers Overall transfer level: Needs assistance Equipment used: Left platform walker Transfers: Sit to/from Stand Sit to Stand: +2 safety/equipment, Mod assist General transfer comment: mod A +2 for power up and maintaining midline, L knee not blocked with transfer Ambulation/Gait Ambulation/Gait assistance: Mod assist, +2 physical assistance, +2 safety/equipment Gait Distance (Feet): 18 Feet Assistive device: Left platform walker Gait Pattern/deviations: Step-to pattern, Decreased stride length, Decreased dorsiflexion - left, Decreased weight shift to left, Narrow base of support, Trunk flexed, Scissoring General Gait Details: At first needed facilitation to not scissor with LLE, but then was able to correct with vc's. Support needed at L knee to prevent buckling and hyperextension, needed more support as she fatigued. Needed mod A to guide RW, without assist, veered to R. vc's for stopping frequently to correct posture  Gait  velocity: decreased Gait velocity interpretation: <1.31 ft/sec, indicative of household ambulator   ADL: ADL Overall ADL's : Needs assistance/impaired Eating/Feeding: NPO  Eating/Feeding Details (indicate cue type and reason): fixated on drinking and taking small sip of water during session Grooming: Maximal assistance Upper Body Bathing: Maximal assistance Lower Body Bathing: Total assistance Upper Body Dressing : Maximal assistance Lower Body Dressing: Total assistance Toilet Transfer: +2 for physical assistance, Maximal assistance General ADL Comments: pt initiates sit<Stand with LLE blocked and facilitation at core for upright posture   Cognition: Cognition Overall Cognitive Status: Impaired/Different from baseline Arousal/Alertness: Awake/alert Orientation Level: Oriented X4 Attention: Sustained Sustained Attention: Impaired Sustained Attention Impairment: Verbal basic, Functional basic Memory: (difficult assess) Awareness: Impaired Awareness Impairment: Intellectual impairment, Emergent impairment Problem Solving: Impaired Problem Solving Impairment: Verbal basic, Functional basic Behaviors: Restless, Impulsive, Poor frustration tolerance Safety/Judgment: Impaired Cognition Arousal/Alertness: Awake/alert Behavior During Therapy: Anxious, Impulsive Overall Cognitive Status: Impaired/Different from baseline Area of Impairment: Following commands, Safety/judgement, Problem solving Current Attention Level: Sustained Following Commands: Follows one step commands inconsistently, Follows one step commands with increased time Safety/Judgement: Decreased awareness of safety, Decreased awareness of deficits Awareness: Intellectual Problem Solving: Difficulty sequencing, Requires verbal cues, Requires tactile cues General Comments: When asked, she can remember to attend to L side, but does not do so without cues.  Difficult to assess due to: Level of arousal     Blood pressure  (!) 95/59, pulse (!) 54, temperature 98 F (36.7 C), temperature source Oral, resp. rate 18, height _0  (1.626 m), weight 87.1 kg, SpO2 99 %. Physical Exam  Nursing note and vitals reviewed. Constitutional: She is oriented to person, place, and time. She appears well-developed and well-nourished.  Slightly drowsy  HENT:  Head: Normocephalic and atraumatic.  Eyes: Pupils are equal, round, and reactive to light. EOM are normal.  Cardiovascular: Normal rate and regular rhythm. Exam reveals no friction rub.  No murmur heard. Respiratory: Effort normal and breath sounds normal. No stridor. No respiratory distress.  GI: Soft. She exhibits no distension. There is no abdominal tenderness.  Musculoskeletal:        General: No tenderness or edema.  Neurological: She is alert and oriented to person, place, and time.  Sl dysarthria. Left central 7. Reasonable insight and awareness. Functional memory. Normal language. Left inattention. Visual fields appear intact. LUE: trace pecs, biceps--->0/5 distally. LLE: 1/5 HE, KE --> 0/5 distally. Senses pain and light touch in all 4's. DTR's increased in LE. Toes up.   Skin: Skin is warm and dry.  Psychiatric: She has a normal mood and affect. Her behavior is normal. Judgment and thought content normal.      Lab Results Last 48 Hours        Results for orders placed or performed during the hospital encounter of 03/02/19 (from the past 48 hour(s))  Glucose, capillary     Status: None    Collection Time: 03/07/19 11:39 AM  Result Value Ref Range    Glucose-Capillary 92 70 - 99 mg/dL  Glucose, capillary     Status: Abnormal    Collection Time: 03/07/19  3:56 PM  Result Value Ref Range    Glucose-Capillary 170 (H) 70 - 99 mg/dL  Glucose, capillary     Status: Abnormal    Collection Time: 03/07/19  7:31 PM  Result Value Ref Range    Glucose-Capillary 170 (H) 70 - 99 mg/dL  Glucose, capillary     Status: Abnormal    Collection Time: 03/07/19 11:17 PM    Result Value Ref Range    Glucose-Capillary 161 (H) 70 - 99 mg/dL  Glucose, capillary  Status: Abnormal    Collection Time: 03/08/19  3:59 AM  Result Value Ref Range    Glucose-Capillary 115 (H) 70 - 99 mg/dL  CBC with Differential/Platelet     Status: Abnormal    Collection Time: 03/08/19  5:07 AM  Result Value Ref Range    WBC 10.8 (H) 4.0 - 10.5 K/uL    RBC 4.07 3.87 - 5.11 MIL/uL    Hemoglobin 12.0 12.0 - 15.0 g/dL    HCT 35.3 (L) 36.0 - 46.0 %    MCV 86.7 80.0 - 100.0 fL    MCH 29.5 26.0 - 34.0 pg    MCHC 34.0 30.0 - 36.0 g/dL    RDW 13.5 11.5 - 15.5 %    Platelets 278 150 - 400 K/uL    nRBC 0.0 0.0 - 0.2 %    Neutrophils Relative % 66 %    Neutro Abs 7.2 1.7 - 7.7 K/uL    Lymphocytes Relative 20 %    Lymphs Abs 2.1 0.7 - 4.0 K/uL    Monocytes Relative 8 %    Monocytes Absolute 0.9 0.1 - 1.0 K/uL    Eosinophils Relative 4 %    Eosinophils Absolute 0.4 0.0 - 0.5 K/uL    Basophils Relative 1 %    Basophils Absolute 0.1 0.0 - 0.1 K/uL    Immature Granulocytes 1 %    Abs Immature Granulocytes 0.13 (H) 0.00 - 0.07 K/uL      Comment: Performed at [facility], [address], [place], [place] [address]  Basic metabolic panel     Status: Abnormal    Collection Time: 03/08/19  5:07 AM  Result Value Ref Range    Sodium 139 135 - 145 mmol/L    Potassium 3.7 3.5 - 5.1 mmol/L    Chloride 107 98 - 111 mmol/L    CO2 22 22 - 32 mmol/L    Glucose, Bld 124 (H) 70 - 99 mg/dL    BUN 8 6 - 20 mg/dL    Creatinine, Ser 0.47 0.44 - 1.00 mg/dL    Calcium 8.8 (L) 8.9 - 10.3 mg/dL    GFR calc non Af Amer >60 >60 mL/min    GFR calc Af Amer >60 >60 mL/min    Anion gap 10 5 - 15      Comment: Performed at [facility] [address] [place], [place] [address]  Glucose, capillary     Status: Abnormal    Collection Time: 03/08/19  7:59 AM  Result Value Ref Range    Glucose-Capillary 159 (H) 70 - 99 mg/dL  Glucose, capillary     Status: Abnormal    Collection Time:  03/08/19 12:30 PM  Result Value Ref Range    Glucose-Capillary 121 (H) 70 - 99 mg/dL  Glucose, capillary     Status: Abnormal    Collection Time: 03/08/19  4:17 PM  Result Value Ref Range    Glucose-Capillary 197 (H) 70 - 99 mg/dL  Glucose, capillary     Status: Abnormal    Collection Time: 03/08/19  7:26 PM  Result Value Ref Range    Glucose-Capillary 166 (H) 70 - 99 mg/dL  Glucose, capillary     Status: Abnormal    Collection Time: 03/08/19 11:37 PM  Result Value Ref Range    Glucose-Capillary 125 (H) 70 - 99 mg/dL    Comment 1 Notify RN    Glucose, capillary     Status: Abnormal  Collection Time: 03/09/19  4:51 AM  Result Value Ref Range    Glucose-Capillary 113 (H) 70 - 99 mg/dL  Glucose, capillary     Status: Abnormal    Collection Time: 03/09/19  8:04 AM  Result Value Ref Range    Glucose-Capillary 159 (H) 70 - 99 mg/dL     Imaging Results (Last 48 hours)  No results found.           Medical Problem List and Plan: 1.  Left hemiparesis and functional deficits secondary to right MCA infarct after right ICA,MCA, ACA occlusion. Pt s/p reperfusion/stenting---embolic pattern of stroke             -patient may shower             -ELOS/Goals: 21-25 days  -left PRAFO/RWHO at HS 2.  Antithrombotics: -DVT/anticoagulation:  Pharmaceutical: Lovenox added 11/30.  Will order dopplers for work up.              -antiplatelet therapy: On ASA/Brillita.  3. Chronic thoracic/ shoulder pain/Pain Management: On hydrocodone rn.  4. Mood: LCSW to follow for evaluation and support. Will order sleep wake chart.              -antipsychotic agents: N/A 5. Neuropsych: This patient is capable of making decisions on her own behalf. 6. Skin/Wound Care: Routine pressure relief measures.  7. Fluids/Electrolytes/Nutrition: Monitor I/O--check lytes in am.  8. R-ICA thrombus s/p stent: On Brillinta and ASA. Monitor CBC serially. Avoid hypotension.  9. Leucocytosis: Trending down with WBC 20-->10.8  on 11/30.  10. Hypomagnesemia: Recheck levels in am. Likely needs multiple vitamins due to history of gastric bypass.  11. T2DM: Hgb A1C- 9.0. Was on lantus 40 units with meal coverage and invokana PTA.  12. Left thyroid nodule/Pulmonary nodules: Per CT chest/abdomen3/2020.  Non-emergent ultrasound for follow up recommended.   13. COPD: On breo daily with albuterol prn.  14. Anxiety/depression: On Desvenlafaxine and clonazepam PTA? Now on Cymbalta 60 mg/day with Seroquel 12.5 mg am/25 mg HS---will discontinue Seroquel as has not been taking it consistently and continues to report issues with insomnia. Will resume Klonopin prn as at home and observe. Keep sleep chart 13. Bradycardia: HR's has been down to high 40's in the past 24 hours.  Asymptomatic? Will monitor for now.  14. Recurrent hypotension: Received 500 cc IVF bolus this am. Monitor BP tid for now.  15. Left breast cancer s/p bilateral mastectomy:  .          Bary Leriche, PA-C 03/09/2019  I have personally performed a face to face diagnostic evaluation of this patient and formulated the key components of the plan.  Additionally, I have personally reviewed laboratory data, imaging studies, as well as relevant notes and concur with the physician assistant's documentation above.  The patient's status has not changed from the original H&P.  Any changes in documentation from the acute care chart have been noted above.  Meredith Staggers, MD, [facility]

## 2019-03-09 NOTE — Progress Notes (Signed)
Orthopedic Tech Progress Note Patient Details:  Ann Lopez 1971/02/26 [id number] Called in order to [facility] for a REHAB COMBINATION of a who, wrist cock up splint and a prafo boot.   Patient ID: Ann Lopez, female   DOB: 1970-07-29, 48 y.o.   MRN: [id number]   Janit Pagan 03/09/2019, 2:41 PM

## 2019-03-09 NOTE — Plan of Care (Signed)
Max assist , using steddy for transfers

## 2019-03-09 NOTE — Progress Notes (Signed)
Occupational Therapy Treatment Patient Details Name: Ann Lopez MRN: [id number] DOB: Aug 20, 1970 Today's Date: 03/09/2019    History of present illness 48 yo female admitted 03/02/19 with R MCA s/p fall from commode due to L side weakness NIH 12 R gaze preference and L neglect. pt under went R common carotid aterigogram complete revascularixation of R ICA  R MCA and R ACA via emergent mechanical thrombectomy  PMH Breast CA DM asthma depression current smoker gastric bypass 2013 IBS   OT comments  Pt continuing to progress toward established OT goals. Pt required modA+2 for sit<>stand with use of sara stedy. Pt completed grooming task while standing at sink level, requiring 1 seated rest break. She required hand over hand assistance for bilateral coordination tasks. Pt demonstrated decreased divided attention skills during ADL while standing, following ADL pt engaged in standing balance and weight shifting while at sink level to facilitate upright posture in preparation for ADL in standing. Pt will continue to benefit from skilled OT services to maximize safety and independence with ADL/IADL and functional mobility. Will continue to follow acutely and progress as tolerated.    Follow Up Recommendations  CIR    Equipment Recommendations  3 in 1 bedside commode;Wheelchair (measurements OT);Wheelchair cushion (measurements OT)    Recommendations for Other Services      Precautions / Restrictions Precautions Precautions: Fall Required Braces or Orthoses: Other Brace Other Brace: L ankle wrapped with ACE for df assist Restrictions Weight Bearing Restrictions: No       Mobility Bed Mobility               General bed mobility comments: sitting in recliner upon arrival  Transfers Overall transfer level: Needs assistance Equipment used: Ambulation equipment used Transfers: Sit to/from Stand Sit to Stand: Mod assist;+2 safety/equipment         General transfer comment:  modA+1 for powerup +2 for safety and equipment;vc to find midline positioning    Balance Overall balance assessment: Needs assistance Sitting-balance support: No upper extremity supported;Feet unsupported Sitting balance-Leahy Scale: Poor     Standing balance support: Single extremity supported;During functional activity Standing balance-Leahy Scale: Poor Standing balance comment: stood at sink level to work on standing balance and weight shifting, required vc to engage core and bring L hip forward                           ADL either performed or assessed with clinical judgement   ADL Overall ADL's : Needs assistance/impaired     Grooming: Maximal assistance;Oral care;Wash/dry hands Grooming Details (indicate cue type and reason): completed standing at sink with stedy                 Toilet Transfer: Moderate assistance;+2 for safety/equipment Toilet Transfer Details (indicate cue type and reason): sit<>stand from recliner use of sara stedy to transfer pt Toileting- Clothing Manipulation and Hygiene: Moderate assistance;+2 for safety/equipment;Sit to/from stand       Functional mobility during ADLs: Moderate assistance;+2 for safety/equipment General ADL Comments: pt with poor divided attention;worked on standing balance and weight shifting standing at sink level in preparation for ADL;required hand over hand assistance for bimanual tasks     Vision   Additional Comments: noted R gaze preference, will attend to left side environment with VC, able to locate items on the countertop and in the bin   Perception     Praxis      Cognition Arousal/Alertness: Awake/alert  Behavior During Therapy: Anxious;Impulsive Overall Cognitive Status: Impaired/Different from baseline Area of Impairment: Attention;Following commands;Safety/judgement;Awareness;Problem solving                   Current Attention Level: Sustained   Following Commands: Follows one step  commands inconsistently;Follows one step commands with increased time Safety/Judgement: Decreased awareness of safety;Decreased awareness of deficits Awareness: Emergent Problem Solving: Difficulty sequencing;Requires verbal cues;Requires tactile cues General Comments: continues to require vc to attend to left side of environment and attend to [place] impulsive at times, requires vc for safety and safe hand placement;        Exercises     Shoulder Instructions       General Comments      Pertinent Vitals/ Pain       Pain Assessment: No/denies pain Pain Intervention(s): Monitored during session  Home Living                                          Prior Functioning/Environment              Frequency  Min 2X/week        Progress Toward Goals  OT Goals(current goals can now be found in the care plan section)  Progress towards OT goals: Progressing toward goals  Acute Rehab OT Goals Patient Stated Goal: go to rehab today OT Goal Formulation: With patient Time For Goal Achievement: 03/18/19 Potential to Achieve Goals: Good ADL Goals Pt Will Perform Grooming: with max assist;sitting Pt Will Perform Upper Body Bathing: with max assist;sitting Pt Will Transfer to Toilet: with +2 assist;with mod assist;stand pivot transfer;bedside commode Additional ADL Goal #1: Pt will locate L UE with R UE prior to mobility for joint protection  Plan Discharge plan remains appropriate    Co-evaluation                 AM-PAC OT "6 Clicks" Daily Activity     Outcome Measure   Help from another person eating meals?: Total Help from another person taking care of personal grooming?: A Lot Help from another person toileting, which includes using toliet, bedpan, or urinal?: A Lot Help from another person bathing (including washing, rinsing, drying)?: A Lot Help from another person to put on and taking off regular upper body clothing?: A Lot Help from another  person to put on and taking off regular lower body clothing?: A Lot 6 Click Score: 11    End of Session Equipment Utilized During Treatment: Gait belt  OT Visit Diagnosis: Unsteadiness on feet (R26.81);Muscle weakness (generalized) (M62.81)   Activity Tolerance Patient tolerated treatment well   Patient Left in chair;with call bell/phone within reach;with chair alarm set   Nurse Communication Mobility status        Time: [id number] OT Time Calculation (min): 37 min  Charges: OT General Charges $OT Visit: 1 Visit OT Treatments $Self Care/Home Management : 23-37 mins  [place] Office: [place] 03/09/2019, 1:17 PM

## 2019-03-09 NOTE — Progress Notes (Signed)
Orthopedic Tech Progress Note Patient Details:  Ann Lopez 1971-03-24 [id number] Called [employer] and placed order for brace. Patient ID: Ann Lopez, female   DOB: 06/24/1970, 48 y.o.   MRN: [id number]   Staci Righter 03/09/2019, 8:34 PM

## 2019-03-09 NOTE — Progress Notes (Signed)
  Speech Language Pathology Treatment: Dysphagia  Patient Details Name: Ann Lopez MRN: [id number] DOB: 05/14/1970 Today's Date: 03/09/2019 Time: [id number] SLP Time Calculation (min) (ACUTE ONLY): 15 min  Assessment / Plan / Recommendation Clinical Impression  Patient observed with lunch meal of Dys. 2 textures with thin liquids via cup and straw. Patient consumed meal without overt s/s of aspiration but demonstrated prologned mastication and mild left anterior spillage that required Min verbal cues to self-monitor and correct. Patient also with decreased attention to task and attempting to talk with a full oral cavity. Recommend patient continue current diet with full supervision to maximize safety. Patient reported family bringing her food from home that is not on her currently prescribed diet (hotdog, chicken wings). Patient educated in regards to aspiration risk, current diet recommendations and appropriate textures. She verbalized understanding but will need reinforcement.    HPI HPI:  Ann Lopez is a 48 y.o. female with a history of diabetes, breast cancer who presents with left-sided weakness that started abruptly.  Patient with history of acute CVA secondary to right ICA terminus occlusion, right MCA occlusion, and right ACA occlusion.  No previous dysphagia hx documented.       SLP Plan  Continue with current plan of care       Recommendations  Diet recommendations: Dysphagia 2 (fine chop);Thin liquid Liquids provided via: Cup;Straw Medication Administration: Whole meds with puree Supervision: Patient able to self feed;Full supervision/cueing for compensatory strategies;Staff to assist with self feeding Compensations: Minimize environmental distractions;Slow rate;Small sips/bites;Lingual sweep for clearance of pocketing;Monitor for anterior loss Postural Changes and/or Swallow Maneuvers: Seated upright 90 degrees                Oral Care Recommendations: Oral care  BID Follow up Recommendations: Inpatient Rehab SLP Visit Diagnosis: Dysphagia, oral phase (R13.11) Plan: Continue with current plan of care       [place], [place] 03/09/2019, 1:56 PM   Ann Lopez, [place], [place]

## 2019-03-10 ENCOUNTER — Inpatient Hospital Stay (HOSPITAL_COMMUNITY): Payer: Medicare Other | Admitting: Speech Pathology

## 2019-03-10 ENCOUNTER — Inpatient Hospital Stay (HOSPITAL_COMMUNITY): Payer: Medicare Other | Admitting: Occupational Therapy

## 2019-03-10 ENCOUNTER — Inpatient Hospital Stay (HOSPITAL_COMMUNITY): Payer: Medicare Other | Admitting: Physical Therapy

## 2019-03-10 LAB — COMPREHENSIVE METABOLIC PANEL
ALT: 33 U/L (ref 0–44)
AST: 25 U/L (ref 15–41)
Albumin: 2.9 g/dL — ABNORMAL LOW (ref 3.5–5.0)
Alkaline Phosphatase: 88 U/L (ref 38–126)
Anion gap: 8 (ref 5–15)
BUN: 9 mg/dL (ref 6–20)
CO2: 26 mmol/L (ref 22–32)
Calcium: 9 mg/dL (ref 8.9–10.3)
Chloride: 105 mmol/L (ref 98–111)
Creatinine, Ser: 0.63 mg/dL (ref 0.44–1.00)
GFR calc Af Amer: >60 mL/min (ref >60)
GFR calc non Af Amer: >60 mL/min (ref >60)
Glucose, Bld: 116 mg/dL — ABNORMAL HIGH (ref 70–99)
Potassium: 4.1 mmol/L (ref 3.5–5.1)
Sodium: 139 mmol/L (ref 135–145)
Total Bilirubin: 0.7 mg/dL (ref 0.3–1.2)
Total Protein: 6.2 g/dL — ABNORMAL LOW (ref 6.5–8.1)

## 2019-03-10 LAB — GLUCOSE, CAPILLARY
Glucose-Capillary: 100 mg/dL — ABNORMAL HIGH (ref 70–99)
Glucose-Capillary: 115 mg/dL — ABNORMAL HIGH (ref 70–99)
Glucose-Capillary: 132 mg/dL — ABNORMAL HIGH (ref 70–99)
Glucose-Capillary: 186 mg/dL — ABNORMAL HIGH (ref 70–99)

## 2019-03-10 LAB — CBC WITH DIFFERENTIAL/PLATELET
Abs Immature Granulocytes: 0.17 10*3/uL — ABNORMAL HIGH (ref 0.00–0.07)
Basophils Absolute: 0.1 10*3/uL (ref 0.0–0.1)
Basophils Relative: 1 %
Eosinophils Absolute: 0.4 10*3/uL (ref 0.0–0.5)
Eosinophils Relative: 4 %
HCT: 38.9 % (ref 36.0–46.0)
Hemoglobin: 12.9 g/dL (ref 12.0–15.0)
Immature Granulocytes: 2 %
Lymphocytes Relative: 27 %
Lymphs Abs: 2.5 10*3/uL (ref 0.7–4.0)
MCH: 29.6 pg (ref 26.0–34.0)
MCHC: 33.2 g/dL (ref 30.0–36.0)
MCV: 89.2 fL (ref 80.0–100.0)
Monocytes Absolute: 0.7 10*3/uL (ref 0.1–1.0)
Monocytes Relative: 8 %
Neutro Abs: 5.4 10*3/uL (ref 1.7–7.7)
Neutrophils Relative %: 58 %
Platelets: 320 10*3/uL (ref 150–400)
RBC: 4.36 MIL/uL (ref 3.87–5.11)
RDW: 13.7 % (ref 11.5–15.5)
WBC: 9.3 10*3/uL (ref 4.0–10.5)
nRBC: 0 % (ref 0.0–0.2)

## 2019-03-10 NOTE — Progress Notes (Signed)
[facility] PHYSICAL MEDICINE & REHABILITATION PROGRESS NOTE   Subjective/Complaints:  Appreciate doppler results  ROS:   Objective:   Vas [place] Lower Extremity Venous (dvt)  Result Date: 03/09/2019  Lower Venous Study Indications: Swelling, and stroke.  Comparison Study: no prior Performing Technologist: June Leap RDMS, RVT  Examination Guidelines: A complete evaluation includes B-mode imaging, spectral Doppler, color Doppler, and power Doppler as needed of all accessible portions of each vessel. Bilateral testing is considered an integral part of a complete examination. Limited examinations for reoccurring indications may be performed as noted.  +---------+---------------+---------+-----------+----------+--------------+ RIGHT    CompressibilityPhasicitySpontaneityPropertiesThrombus Aging +---------+---------------+---------+-----------+----------+--------------+ CFV      Full           Yes      Yes                                 +---------+---------------+---------+-----------+----------+--------------+ SFJ      Full                                                        +---------+---------------+---------+-----------+----------+--------------+ FV Prox  Full                                                        +---------+---------------+---------+-----------+----------+--------------+ FV Mid   Full                                                        +---------+---------------+---------+-----------+----------+--------------+ FV DistalFull                                                        +---------+---------------+---------+-----------+----------+--------------+ PFV      Full                                                        +---------+---------------+---------+-----------+----------+--------------+ POP      Full           Yes      Yes                                  +---------+---------------+---------+-----------+----------+--------------+ PTV      Full                                                        +---------+---------------+---------+-----------+----------+--------------+ PERO     Full                                                        +---------+---------------+---------+-----------+----------+--------------+   +---------+---------------+---------+-----------+----------+--------------+  LEFT     CompressibilityPhasicitySpontaneityPropertiesThrombus Aging +---------+---------------+---------+-----------+----------+--------------+ CFV      Full           Yes      Yes                                 +---------+---------------+---------+-----------+----------+--------------+ SFJ      Full                                                        +---------+---------------+---------+-----------+----------+--------------+ FV Prox  Full                                                        +---------+---------------+---------+-----------+----------+--------------+ FV Mid   Full                                                        +---------+---------------+---------+-----------+----------+--------------+ FV DistalFull                                                        +---------+---------------+---------+-----------+----------+--------------+ PFV      Full                                                        +---------+---------------+---------+-----------+----------+--------------+ POP      Full           Yes      Yes                                 +---------+---------------+---------+-----------+----------+--------------+ PTV      Full                                                        +---------+---------------+---------+-----------+----------+--------------+ PERO     Full                                                         +---------+---------------+---------+-----------+----------+--------------+     Summary: Right: There is no evidence of deep vein thrombosis in the lower extremity. No cystic structure found in the popliteal fossa. Left: There is no evidence of deep vein thrombosis in the lower extremity. No cystic structure found in the popliteal fossa.  *  See table(s) above for measurements and observations.    Preliminary    Recent Labs    03/08/19 0507 03/10/19 0441  WBC 10.8* 9.3  HGB 12.0 12.9  HCT 35.3* 38.9  PLT 278 320   Recent Labs    03/08/19 0507 03/10/19 0441  NA 139 139  K 3.7 4.1  CL 107 105  CO2 22 26  GLUCOSE 124* 116*  BUN 8 9  CREATININE 0.47 0.63  CALCIUM 8.8* 9.0    Intake/Output Summary (Last 24 hours) at 03/10/2019 0704 Last data filed at 03/09/2019 1844 Gross per 24 hour  Intake 480 ml  Output -  Net 480 ml     Physical Exam: Vital Signs Blood pressure 104/61, pulse (!) 53, temperature 98.1 F (36.7 C), resp. rate 17, height 5\' 3"  (1.6 m), weight 87.9 kg, SpO2 100 %.   General: No acute distress Mood and affect are appropriate Heart: Regular rate and rhythm no rubs murmurs or extra sounds Lungs: Clear to auscultation, breathing unlabored, no rales or wheezes Abdomen: Positive bowel sounds, soft nontender to palpation, nondistended Extremities: No clubbing, cyanosis, or edema Skin: No evidence of breakdown, no evidence of rash Neurologic: Cranial nerves II through XII intact, motor strength is 5/5 in right  deltoid, bicep, tricep, grip, hip flexor, knee extensors, ankle dorsiflexor and plantar flexor 0/5 in LUE and 3- LLE  Sensory exam normal sensation to light touch and proprioception in bilateral upper and lower extremities Cerebellar exam normal finger to nose to finger as well as heel to shin in bilateral upper and lower extremities Musculoskeletal: Full range of motion in all 4 extremities. No joint swelling   Assessment/Plan: 1. Functional deficits  secondary to Right MCA infarct which require 3+ hours per day of interdisciplinary therapy in a comprehensive inpatient rehab setting.  Physiatrist is providing close team supervision and 24 hour management of active medical problems listed below.  Physiatrist and rehab team continue to assess barriers to discharge/monitor patient progress toward functional and medical goals  Care Tool:  Bathing              Bathing assist       Upper Body Dressing/Undressing Upper body dressing   What is the patient wearing?: Dress    Upper body assist Assist Level: Minimal Assistance - Patient > 75%    Lower Body Dressing/Undressing Lower body dressing      What is the patient wearing?: Incontinence brief     Lower body assist Assist for lower body dressing: Maximal Assistance - Patient 25 - 49%     Toileting Toileting    Toileting assist Assist for toileting: 2 Helpers     Transfers Chair/bed transfer  Transfers assist     Chair/bed transfer assist level: 2 Helpers     Locomotion Ambulation   Ambulation assist   Ambulation activity did not occur: Safety/medical concerns          Walk 10 feet activity   Assist           Walk 50 feet activity   Assist           Walk 150 feet activity   Assist           Walk 10 feet on uneven surface  activity   Assist           Wheelchair     Assist               Wheelchair 50 feet with  2 turns activity    Assist            Wheelchair 150 feet activity     Assist          Blood pressure 104/61, pulse (!) 53, temperature 98.1 F (36.7 C), resp. rate 17, height 5\' 3"  (1.6 m), weight 87.9 kg, SpO2 100 %.  Medical Problem List and Plan: 1.Left hemiparesis and functional deficitssecondary to right MCA infarct after right ICA,MCA, ACA occlusion. Pt s/p reperfusion/stenting---embolic pattern of stroke -team conf in am  -ELOS/Goals: 21-25  days             -left PRAFO/RWHO at HS 2. Antithrombotics: -DVT/anticoagulation:Pharmaceutical:Lovenoxadded 11/30. Will order dopplers for work up.  -antiplatelet therapy: On ASA/Brillita. 3.Chronic thoracic/ shoulder pain/Pain Management:On hydrocodone rn. 4. Mood:LCSW to follow for evaluation and support. Will order sleep wake chart. -antipsychotic agents: N/A 5. Neuropsych: This patientiscapable of making decisions on herown behalf. 6. Skin/Wound Care:Routine pressure relief measures. 7. Fluids/Electrolytes/Nutrition:Monitor I/O--check lytes in am.  8. R-ICAthrombus s/pstent: On Brillinta and ASA. Monitor CBC serially. Avoid hypotension. 9. Leucocytosis: Trending down with WBC 20-->10.8on 11/30. 10. Hypomagnesemia:Recheck levels in am. Likely needs multiple vitamins due to history of gastric bypass. 11. T2DM: Hgb A1C- 9.0. Was on lantus 40 units with meal coverage and invokana PTA.  CBG (last 3)  Recent Labs    03/09/19 1724 03/09/19 2059 03/10/19 0543  GLUCAP 243* 138* 100*  on Lantus 15U and Invokana controlled  12. Left thyroid nodule/Pulmonary nodules: Per CT neck 03/02/2019. Non-emergent thyroid ultrasound for follow up recommended.  13. COPD: On breo daily with albuterol prn.  14. Anxiety/depression: On Desvenlafaxine and clonazepam PTA?Now on Cymbalta 60 mg/day with Seroquel 12.5 mg am/25 mg HS---will discontinue Seroquel as has not been taking it consistently and continues to report issues with insomnia. Will resume Klonopin prn as at home andobserve. Keep sleep chart 13. Bradycardia: HR's has been down to high 40's in the past 24 hours. Asymptomatic? Will monitor for now.  14. Recurrent hypotension:Received 500 cc IVF bolus this am. Monitor BP tid for now. Vitals:   03/09/19 1931 03/10/19 0535  BP: (!) 111/57 104/61  Pulse: 60 (!) 53  Resp: 18 17  Temp: 98.3 F (36.8 C) 98.1 F (36.7 C)  SpO2: 96% 100%  BP in  normal range 12/1 15. Left breast cancer s/p bilateral mastectomy:    LOS: 1 days A FACE TO FACE EVALUATION WAS PERFORMED  Ann Lopez 03/10/2019, 7:04 AM

## 2019-03-10 NOTE — Care Management Note (Signed)
[facility] Individual Statement of Services  Patient Name:  Ann Lopez  Date:  03/10/2019  Welcome to the [facility].  Our goal is to provide you with an individualized program based on your diagnosis and situation, designed to meet your specific needs.  With this comprehensive rehabilitation program, you will be expected to participate in at least 3 hours of rehabilitation therapies Monday-Friday, with modified therapy programming on the weekends.  Your rehabilitation program will include the following services:  Physical Therapy (PT), Occupational Therapy (OT), Speech Therapy (ST), 24 hour per day rehabilitation nursing, Neuropsychology, Case Management (Social Worker), Rehabilitation Medicine, Nutrition Services and Pharmacy Services  Weekly team conferences will be held on Wednesday to discuss your progress.  Your Social Worker will talk with you frequently to get your input and to update you on team discussions.  Team conferences with you and your family in attendance may also be held.  Expected length of stay: 18-21 days  Overall anticipated outcome: CGA-min level  Depending on your progress and recovery, your program may change. Your Social Worker will coordinate services and will keep you informed of any changes. Your Social Worker's name and contact numbers are listed  below.  The following services may also be recommended but are not provided by the [facility] will be made to provide these services after discharge if needed.  Arrangements include referral to agencies that provide these services.  Your insurance has been verified to be:  [employer]-Medicare & Medicaid Your primary doctor is:  Ann Lopez  Pertinent information will be shared with your doctor and your insurance company.  Social Worker:   Ovidio Kin, [place] or ([phone]  Information discussed with and copy given to patient by: Elease Hashimoto, 03/10/2019, 9:20 AM

## 2019-03-10 NOTE — Progress Notes (Signed)
Social Work Assessment and Plan   Patient Details  Name: Ann Lopez MRN: [id number] Date of Birth: 06/13/1970  Today's Date: 03/10/2019  Problem List:  Patient Active Problem List   Diagnosis Date Noted  . Acute ischemic right middle cerebral artery (MCA) stroke ([place]) 03/09/2019  . Left hemiparesis ([place])   . Bradycardia   . Tachypnea   . SIRS (systemic inflammatory response syndrome) (H[facility])   . Hypokalemia   . Diabetes mellitus type 2 in obese ([place])   . Tobacco abuse   . History of breast cancer   . Cognitive deficits   . Dysphagia, post-stroke   . Stroke (cerebrum) ([place]) 03/02/2019  . Middle cerebral artery embolism, right 03/02/2019  . Chest pain 12/19/2015  . Numbness 12/19/2015  . Femoral hernia 03/24/2014  . Dyslipidemia 09/10/2011  . BRCA1 positive 09/10/2011  . Breast cancer ([place]) 09/10/2011  . H/O gastric bypass; 05/14/11([facility]) 09/10/2011  . Essential hypertension 10/28/2008  . DEPRESSION/ANXIETY 02/19/2007  . CARPAL TUNNEL SYNDROME, RIGHT 02/19/2007  . ALLERGIC RHINITIS 02/19/2007  . GERD 02/19/2007  . IRRITABLE BOWEL SYNDROME 02/19/2007  . HERPES GENITALIS 12/30/2006  . Diabetes ([place]) 12/30/2006  . OSTEOPENIA 12/30/2006  . ANOMALY, CONGENITAL, NERVOUS SYSTEM NEC 12/30/2006   Past Medical History:  Past Medical History:  Diagnosis Date  . Allergy   . Anxiety   . Asthma   . Back pain   . Breast cancer ([place])   . Cancer ([place])   . Depression   . Diabetes mellitus   . Headache   . Hyperlipemia   . Osteopenia   . Osteoporosis    osteopenia   Past Surgical History:  Past Surgical History:  Procedure Laterality Date  . ABDOMINAL HYSTERECTOMY     B oophorectomy for BRCA1 gene  . BREAST SURGERY    . CESAREAN SECTION    . INCISION AND DRAINAGE ABSCESS Left 11/14/2012   Procedure: INCISION AND DRAINAGE ABSCESS LEFT GROIN;  Surgeon: Jamesetta So, MD;  Location: [facility];  Service: General;  Laterality: Left;  . IR ANGIO VERTEBRAL SEL SUBCLAVIAN  INNOMINATE UNI R MOD SED  03/02/2019  . IR CT HEAD LTD  03/02/2019  . IR INTRAVSC STENT CERV CAROTID W/O EMB-PROT MOD SED INC ANGIO  03/02/2019  . IR PERCUTANEOUS ART THROMBECTOMY/INFUSION INTRACRANIAL INC DIAG ANGIO  03/02/2019  . LYMPHADENECTOMY    . MASTECTOMY Bilateral   . RADIOLOGY WITH ANESTHESIA N/A 03/02/2019   Procedure: IR WITH ANESTHESIA;  Surgeon: Luanne Bras, MD;  Location: [facility];  Service: Radiology;  Laterality: N/A;   Social History:  reports that she has been smoking cigarettes. She has been smoking about 0.50 packs per day. She has never used smokeless tobacco. She reports that she does not drink alcohol or use drugs.  Family / Support Systems Marital Status: Single Patient Roles: Parent, Caregiver Children: Jesus-son [phone]   Justin-son [phone]  [phone] Other Supports: 66 yo son and 77 yo son whom lives with her Anticipated Caregiver: Two older son's, neighbor and best friend Ability/Limitations of Caregiver: Dedicated to pt and will try to work out care for her Caregiver Availability: Other (Comment)(Awaiting evaluations and will come up with confirmed plan) Family Dynamics: Close with her four boys and friends and neighbors. She feels between all of them she will have the care she needs and wants to get mod/i level before going home. She has had many people offer to help  Social History Preferred language: English Religion: Baptist Cultural Background: No issues Education:  HS Read: Yes Write: Yes Employment Status: Disabled Date Retired/Disabled/Unemployed: Disabled after breast cancer Legal History/Current Legal Issues: No issues Guardian/Conservator: None-according to MD pt is capableof making her own decisions while here. She will include her son's she always has   Abuse/Neglect Abuse/Neglect Assessment Can Be Completed: Yes Physical Abuse: Denies Verbal Abuse: Denies Sexual Abuse: Denies Exploitation of patient/patient's  resources: Denies Self-Neglect: Denies  Emotional Status Pt's affect, behavior and adjustment status: Pt is motivated to improve and regain her independence while here. She reports she is walking holding the rail and walker. She has never been one who needed care even when she had her breast cancer. She and her boys will work together on her care and they will do waht she tells them to do. Recent Psychosocial Issues: other health issues-breast cancer and lower back issues which she is disabled from. Psychiatric History: History of depression was not taking anything at this time, but feel she may need something due to her stroke and deficits. Will refer to neuro-psych due to young age and history of depression for coping assistance. Substance Abuse History: Tobacco-aware should quit due to health issues and is planning too now  Patient / Family Perceptions, Expectations & Goals Pt/Family understanding of illness & functional limitations: Pt and her son can explain her stroke and deficits, she is improving which is encouraging to her and gives her hope she will recover from this. They have spoken with the MD and feel they have a good understanding of her treatment plan going forward. Premorbid pt/family roles/activities: Mom, caregiver, sitter for elderly gentleman, neighbor, friend, retiree, cancer survivor, etc Anticipated changes in roles/activities/participation: resume Pt/family expectations/goals: Pt states: " I want to be able to take care of myself, I always have and don't want my boys to have too."  Jesus states: " We will do whatever she needs she would for [place] and has."  [employer]: None Premorbid Home Care/DME Agencies: None Transportation available at discharge: Pt did drive prior to admission-will rely upon older son's and friends Resource referrals recommended: Neuropsychology, Support group (specify)  Discharge Planning Living Arrangements:  Children Support Systems: Children, Friends/neighbors Type of Residence: Private residence Insurance underwriter Resources: Information systems manager, [employer] (specify county) Museum/gallery curator Resources: [employer] Screen Referred: No Living Expenses: Medical laboratory scientific officer Management: Patient Does the patient have any problems obtaining your medications?: No Home Management: Self Patient/Family Preliminary Plans: Return home with son's, friend and neighbor assisting her. She will have care if needed, but is hoping she will be able to take care of herself by the time she leaves here. Will work with son's on discharge plan and await therapy evaluations. Social Work Anticipated Follow Up Needs: HH/OP, Support Group  Clinical Impression Pleasant motivated female who plans to work hard to recover from her stroke. She has four very supportive son's who are willing to assist her and also friends and neighbors willing to help. She was disabled from her cancer and low back issues, but still assisted a older gentleman twice a week. With her history of depression will ask neuro-psych to see and assist with coping. Will work on discharge needs.  Elease Hashimoto 03/10/2019, 12:38 PM

## 2019-03-10 NOTE — Evaluation (Signed)
Physical Therapy Assessment and Plan  Patient Details  Name: Ann Lopez MRN: [id number] Date of Birth: Mar 28, 1971  PT Diagnosis: Abnormal posture, Abnormality of gait, Cognitive deficits, Difficulty walking, Hemiparesis non-dominant, Impaired cognition and Muscle weakness Rehab Potential: Good ELOS: ~ 3 weeks   Today's Date: 03/10/2019 PT Individual Time: 0906-1002 PT Individual Time Calculation (min): 56 min    Problem List:  Patient Active Problem List   Diagnosis Date Noted  . Acute ischemic right middle cerebral artery (MCA) stroke ([place]) 03/09/2019  . Left hemiparesis ([place])   . Bradycardia   . Tachypnea   . SIRS (systemic inflammatory response syndrome) (H[facility])   . Hypokalemia   . Diabetes mellitus type 2 in obese ([place])   . Tobacco abuse   . History of breast cancer   . Cognitive deficits   . Dysphagia, post-stroke   . Stroke (cerebrum) ([place]) 03/02/2019  . Middle cerebral artery embolism, right 03/02/2019  . Chest pain 12/19/2015  . Numbness 12/19/2015  . Femoral hernia 03/24/2014  . Dyslipidemia 09/10/2011  . BRCA1 positive 09/10/2011  . Breast cancer ([place]) 09/10/2011  . H/O gastric bypass; 05/14/11([facility]) 09/10/2011  . Essential hypertension 10/28/2008  . DEPRESSION/ANXIETY 02/19/2007  . CARPAL TUNNEL SYNDROME, RIGHT 02/19/2007  . ALLERGIC RHINITIS 02/19/2007  . GERD 02/19/2007  . IRRITABLE BOWEL SYNDROME 02/19/2007  . HERPES GENITALIS 12/30/2006  . Diabetes ([place]) 12/30/2006  . OSTEOPENIA 12/30/2006  . ANOMALY, CONGENITAL, NERVOUS SYSTEM NEC 12/30/2006    Past Medical History:  Past Medical History:  Diagnosis Date  . Allergy   . Anxiety   . Asthma   . Back pain   . Breast cancer ([place])   . Cancer ([place])   . Depression   . Diabetes mellitus   . Headache   . Hyperlipemia   . Osteopenia   . Osteoporosis    osteopenia   Past Surgical History:  Past Surgical History:  Procedure Laterality Date  . ABDOMINAL HYSTERECTOMY     B oophorectomy for BRCA1  gene  . BREAST SURGERY    . CESAREAN SECTION    . INCISION AND DRAINAGE ABSCESS Left 11/14/2012   Procedure: INCISION AND DRAINAGE ABSCESS LEFT GROIN;  Surgeon: Jamesetta So, MD;  Location: [facility];  Service: General;  Laterality: Left;  . IR ANGIO VERTEBRAL SEL SUBCLAVIAN INNOMINATE UNI R MOD SED  03/02/2019  . IR CT HEAD LTD  03/02/2019  . IR INTRAVSC STENT CERV CAROTID W/O EMB-PROT MOD SED INC ANGIO  03/02/2019  . IR PERCUTANEOUS ART THROMBECTOMY/INFUSION INTRACRANIAL INC DIAG ANGIO  03/02/2019  . LYMPHADENECTOMY    . MASTECTOMY Bilateral   . RADIOLOGY WITH ANESTHESIA N/A 03/02/2019   Procedure: IR WITH ANESTHESIA;  Surgeon: Luanne Bras, MD;  Location: [facility];  Service: Radiology;  Laterality: N/A;    Assessment & Plan Clinical Impression: Patient is a 48 y.o. year old female with with history of T2DM, HTN, gastricsleeve, breast cancer s/p bilateral mastectomywith TAH/BSOand post chemo memory deficits, tobacco use, migraines, back pain who was admitted on 03/02/19 after fall from the commode and found to have left sided weakness with left neglect and right gaze preference. UDS positive with opioids and amphetamines. CTA/P head revealed near occlusive thrombus within R-ICA terminus extending into M1 R-MCA with A1 R-ACA with core infarct in R-MCA and large penumbra as well as incidental finding of left thyroid nodule. She was taken for cerebral angio with emergent thrombectomy and complete revascularization of occluded R- ICA terminus, R-MCA and R-ACA with  stent assisted angioplasty of symptomatic proximal R-ICA stenosis by DR. Deveshwar. Post procedure with hypotension felt to be due to carotid manipulation as well as decrease in LOC with increase in LLE tone. EEG showed diffuse encephalopathy and repeat CT head showed some mass effect on right lateral ventricle and was negative for hemorrhage.   She tolerated extubation without difficulty but had bouts of agitation alternating with  lethargy. 2D echo showed EF 60-65% with no LVH and no wall abnormality. Most recent CT head 11/25 showed residual hyperdensity within infarcted right lentiform nucleus c/w resolving confluent petechial hemorrhage and persistent partial effacement of right lateral ventricle. Dr. Erlinda Hong felt that stroke embolic due to large vessel stenosis but PAF not fully ruled out--30 day cardiac event monitor recommended on outpatient basis. Neurology recommending SBP goal 120-140--hypotensive today with SBP in 90's thereforetreated with fluid bolus.Patient with resultant left facial droop with cognitive deficits, left sided weakness and right gaze preference with left neglect affecting mobility and ADLs. CIR recommended due to functional decline.Patient transferred to CIR on 03/09/2019 .   Patient currently requires max with mobility secondary to muscle weakness, muscle joint tightness and muscle paralysis, decreased cardiorespiratoy endurance, impaired timing and sequencing, abnormal tone, unbalanced muscle activation, motor apraxia, decreased coordination and decreased motor planning, decreased attention to left, decreased attention, decreased awareness, decreased problem solving, decreased safety awareness and delayed processing and decreased sitting balance, decreased standing balance, decreased postural control and decreased balance strategies.  Prior to hospitalization, patient was independent  with mobility and lived with Son in a Mobile home home.  Home access is 3Stairs to enter.  Patient will benefit from skilled PT intervention to maximize safe functional mobility, minimize fall risk and decrease caregiver burden for planned discharge home with 24 hour assist.  Anticipate patient will benefit from follow up [facility] at discharge.  PT - End of Session Activity Tolerance: Tolerates 30+ min activity with multiple rests Endurance Deficit: Yes Endurance Deficit Description: requires frequent rest breaks PT  Assessment Rehab Potential (ACUTE/IP ONLY): Good PT Barriers to Discharge: Inaccessible home environment;Home environment access/layout PT Patient demonstrates impairments in the following area(s): Balance;Perception;Behavior;Safety;Edema;Sensory;Endurance;Motor;Pain PT Transfers Functional Problem(s): Bed Mobility;Bed to Chair;Car;Furniture PT Locomotion Functional Problem(s): Ambulation;Wheelchair Mobility;Stairs PT Plan PT Intensity: Minimum of 1-2 x/day ,45 to 90 minutes PT Frequency: 5 out of 7 days PT Duration Estimated Length of Stay: ~ 3 weeks PT Treatment/Interventions: Ambulation/gait training;Community reintegration;DME/adaptive equipment instruction;Neuromuscular re-education;Psychosocial support;Stair training;UE/LE Strength taining/ROM;Wheelchair propulsion/positioning;Balance/vestibular training;Discharge planning;Functional electrical stimulation;Pain management;Skin care/wound management;Therapeutic Activities;UE/LE Coordination activities;Cognitive remediation/compensation;Disease management/prevention;Functional mobility training;Patient/family education;Splinting/orthotics;Visual/perceptual remediation/compensation;Therapeutic Exercise PT Transfers Anticipated Outcome(s): CGA transfers PT Locomotion Anticipated Outcome(s): min assist ambulation using LRAD PT Recommendation Recommendations for Other Services: Neuropsych consult;Therapeutic Recreation consult Therapeutic Recreation Interventions: Stress management Follow Up Recommendations: Home health PT;24 hour supervision/assistance Patient destination: Home Equipment Recommended: To be determined  Skilled Therapeutic Intervention Evaluation completed (see details above and below) with education on PT POC and goals and individual treatment initiated with focus on bed mobility, transfers, activity tolerance, and gait initiation as well as education regarding daily therapy schedule, weekly team meetings, purpose of PT  evaluation, and other CIR information. Pt received supine in bed and agreeable to therapy session. Pt demonstrates R gaze preference with L inattention throughout therapy session. Supine>sit, HOB slightly elevated and using bedrails, with mod assist for trunk upright and L LE management. R stand pivot EOB>w/c, no AD, with mod assist for balance and L LE management to block knee buckling. Transported to/from gym in w/c. Gait  training 72f at R hallway rail with max assist and +2 w/c follow - demonstrates L hip adduction, L ankle inversion, either L knee buckling or hyperextension during stance phase - pt requires max assist for L LE positioning after swing and for knee stability during stance. Performed R hemi-technique w/c propulsion 550fwith min assist for forward propulsion as pt not fully able to reach ground with her R foot due to her height and height of w/c. Therapist provided pt with w/c cushion for improved comfort in chair to increase upright OOB activity tolerance. Simulated stand pivot car transfer (sedan height) with max assist for LLE management and balance - pt required assistance for stepping L LE when transferring to the L. Transported back to room in w/c and pt left seated in w/c with education on fall risk and need to call for assistance with seat belt alarm on, L UE supported on pillow, and needs in reach.   PT Evaluation Precautions/Restrictions Precautions Precautions: Fall Restrictions Weight Bearing Restrictions: No Pain Pain Assessment Pain Scale: 0-10 Pain Score: 0-No pain Home Living/Prior Functioning Home Living Available Help at Discharge: Available 24 hours/day(reports she has 3 other boys 21y.o., 26y.o. and 28y.o. and her best friend & neighbor who can take shifts to provide 24hr support) Type of Home: Mobile home(reports she may go to 1 of her children's houses) Home Access: Stairs to enter [employer] brewerf Steps: 3(reports her son's house has 2STE with  handrails then +1 step up into the house from the back deck) Entrance Stairs-Rails: Can reach both Home Layout: One level  Lives With: Son(15y.o. son) Prior Function Level of Independence: Independent with gait;Independent with transfers;Independent with homemaking with ambulation;Other (comment)(needed assistance with picking up stuff due to "lower back issues")  Able to Take Stairs?: Yes Driving: Yes Leisure: Hobbies-yes (Comment) Comments: enjoys spending time with her family including her grandbaby Perception  Perception Perception: Impaired Inattention/Neglect: Does not attend to left side of body;Does not attend to left visual field Praxis Praxis: Impaired Praxis Impairment Details: Motor planning  Cognition Overall Cognitive Status: Impaired/Different from baseline Arousal/Alertness: Awake/alert Orientation Level: Oriented X4 Attention: Focused;Sustained Focused Attention: Appears intact Sustained Attention: Impaired Awareness: Impaired Problem Solving: Impaired Safety/Judgment: Impaired Sensation Sensation Light Touch: Appears Intact Hot/Cold: Not tested Proprioception: Impaired by gross assessment Stereognosis: Not tested Coordination Gross Motor Movements are Fluid and Coordinated: No Coordination and Movement Description: impaired due to L hemiparesis, L inattention, and impaired coordination Heel Shin Test: L LE impaired due to paresis Motor  Motor Motor: Hemiplegia;Abnormal postural alignment and control Motor - Skilled Clinical Observations: LUE and LLE hemiparesis  Mobility Bed Mobility Bed Mobility: Supine to Sit Supine to Sit: Moderate Assistance - Patient 50-74% Sit to Supine: Moderate Assistance - Patient 50-74% Transfers Transfers: Sit to Stand;Stand Pivot Transfers Sit to Stand: Moderate Assistance - Patient 50-74% Stand Pivot Transfers: Maximal Assistance - Patient 25 - 49% Stand Pivot Transfer Details: Tactile cues for sequencing;Tactile cues  for weight shifting;Tactile cues for placement;Manual facilitation for placement;Manual facilitation for weight shifting;Verbal cues for gait pattern;Verbal cues for technique;Verbal cues for sequencing Transfer (Assistive device): None Locomotion  Gait Ambulation: Yes Gait Assistance: 2 Helpers;Maximal Assistance - Patient 25-49% Gait Distance (Feet): 30 Feet Assistive device: Other (Comment)(hallway rail) Gait Assistance Details: Tactile cues for initiation;Tactile cues for sequencing;Tactile cues for weight shifting;Manual facilitation for weight shifting;Manual facilitation for placement;Verbal cues for gait pattern;Verbal cues for precautions/safety;Verbal cues for sequencing;Verbal cues for technique Gait Gait: Yes Gait Pattern: Impaired Gait Pattern: Decreased  weight shift to right;Decreased weight shift to left;Poor foot clearance - left;Decreased dorsiflexion - left;Decreased stance time - left;Decreased step length - left;Decreased step length - right;Scissoring;Lateral trunk lean to left;Narrow base of support Gait velocity: decreased Stairs / Additional Locomotion Stairs: No Wheelchair Mobility Wheelchair Mobility: Yes Wheelchair Assistance: Minimal assistance - Patient >75% Wheelchair Propulsion: Right upper extremity;Right lower extremity Wheelchair Parts Management: Needs assistance Distance: 80f  Trunk/Postural Assessment  Cervical Assessment Cervical Assessment: Exceptions to WFL(keep head rotated to the R) Thoracic Assessment Thoracic Assessment: Exceptions to WFL(thoracic elongation on L) Lumbar Assessment Lumbar Assessment: Exceptions to WFL(posterior pelvic tilt) Postural Control Postural Control: Deficits on evaluation Trunk Control: decreased with LOB to the left Protective Responses: impaired Postural Limitations: decreased  Balance Balance Balance Assessed: Yes Static Sitting Balance Static Sitting - Balance Support: Feet supported Static Sitting -  Level of Assistance: 5: Stand by assistance Dynamic Sitting Balance Dynamic Sitting - Balance Support: During functional activity Dynamic Sitting - Level of Assistance: 4: Min aInsurance risk surveyorStanding - Balance Support: During functional activity Static Standing - Level of Assistance: 3: Mod assist Dynamic Standing Balance Dynamic Standing - Balance Support: During functional activity Dynamic Standing - Level of Assistance: 2: Max assist;Other (comment)(+2 assist) Extremity Assessment      RLE Assessment RLE Assessment: Within Functional Limits General Strength Comments: Grossly 4+/5 to 5/5 throughout assessed in sitting LLE Assessment LLE Assessment: Exceptions to WKohala HospitalPassive Range of Motion (PROM) Comments: L ankle rests in inversion LLE Strength Left Hip Flexion: 3/5 Left Knee Flexion: 2+/5 Left Knee Extension: 3/5 Left Ankle Dorsiflexion: 0/5 Left Ankle Plantar Flexion: 0/5    Refer to Care Plan for Long Term Goals  Recommendations for other services: Neuropsych and Therapeutic Recreation  Stress management  Discharge Criteria: Patient will be discharged from PT if patient refuses treatment 3 consecutive times without medical reason, if treatment goals not met, if there is a change in medical status, if patient makes no progress towards goals or if patient is discharged from hospital.  The above assessment, treatment plan, treatment alternatives and goals were discussed and mutually agreed upon: by patient  CTawana Scale PT, DPT 03/10/2019, 7:48 AM

## 2019-03-10 NOTE — Evaluation (Signed)
Occupational Therapy Assessment and Plan  Patient Details  Name: Ann Lopez MRN: [id number] Date of Birth: 1970/10/10  OT Diagnosis: abnormal posture, cognitive deficits, hemiplegia affecting non-dominant side and muscle weakness (generalized) Rehab Potential: Rehab Potential (ACUTE ONLY): Excellent ELOS: 18-21 days   Today's Date: 03/10/2019 OT Individual Time: 4098-1191 OT Individual Time Calculation (min): 60 min     Problem List:  Patient Active Problem List   Diagnosis Date Noted  . Acute ischemic right middle cerebral artery (MCA) stroke ([place]) 03/09/2019  . Left hemiparesis ([place])   . Bradycardia   . Tachypnea   . SIRS (systemic inflammatory response syndrome) ([facility])   . Hypokalemia   . Diabetes mellitus type 2 in obese ([place])   . Tobacco abuse   . History of breast cancer   . Cognitive deficits   . Dysphagia, post-stroke   . Stroke (cerebrum) ([place]) 03/02/2019  . Middle cerebral artery embolism, right 03/02/2019  . Chest pain 12/19/2015  . Numbness 12/19/2015  . Femoral hernia 03/24/2014  . Dyslipidemia 09/10/2011  . BRCA1 positive 09/10/2011  . Breast cancer ([place]) 09/10/2011  . H/O gastric bypass; 05/14/11([facility]) 09/10/2011  . Essential hypertension 10/28/2008  . DEPRESSION/ANXIETY 02/19/2007  . CARPAL TUNNEL SYNDROME, RIGHT 02/19/2007  . ALLERGIC RHINITIS 02/19/2007  . GERD 02/19/2007  . IRRITABLE BOWEL SYNDROME 02/19/2007  . HERPES GENITALIS 12/30/2006  . Diabetes ([place]) 12/30/2006  . OSTEOPENIA 12/30/2006  . ANOMALY, CONGENITAL, NERVOUS SYSTEM NEC 12/30/2006    Past Medical History:  Past Medical History:  Diagnosis Date  . Allergy   . Anxiety   . Asthma   . Back pain   . Breast cancer ([place])   . Cancer ([place])   . Depression   . Diabetes mellitus   . Headache   . Hyperlipemia   . Osteopenia   . Osteoporosis    osteopenia   Past Surgical History:  Past Surgical History:  Procedure Laterality Date  . ABDOMINAL HYSTERECTOMY     B oophorectomy for  BRCA1 gene  . BREAST SURGERY    . CESAREAN SECTION    . INCISION AND DRAINAGE ABSCESS Left 11/14/2012   Procedure: INCISION AND DRAINAGE ABSCESS LEFT GROIN;  Surgeon: Jamesetta So, MD;  Location: [facility];  Service: General;  Laterality: Left;  . IR ANGIO VERTEBRAL SEL SUBCLAVIAN INNOMINATE UNI R MOD SED  03/02/2019  . IR CT HEAD LTD  03/02/2019  . IR INTRAVSC STENT CERV CAROTID W/O EMB-PROT MOD SED INC ANGIO  03/02/2019  . IR PERCUTANEOUS ART THROMBECTOMY/INFUSION INTRACRANIAL INC DIAG ANGIO  03/02/2019  . LYMPHADENECTOMY    . MASTECTOMY Bilateral   . RADIOLOGY WITH ANESTHESIA N/A 03/02/2019   Procedure: IR WITH ANESTHESIA;  Surgeon: Luanne Bras, MD;  Location: [facility];  Service: Radiology;  Laterality: N/A;    Assessment & Plan Clinical Impression: Patient is a 48 y.o. year old female with recent admission to the hospital on 03/02/19 after fall from the commode and found to have left sided weakness with left neglect and right gaze preference. UDS positive with opioids and amphetamines. CTA/P head revealed near occlusive thrombus within R-ICA terminus extending into M1 R-MCA with A1 R-ACA with core infarct in R-MCA and large penumbra as well as incidental finding of left thyroid nodule. She was taken for cerebral angio with emergent thrombectomy and complete revascularization of occluded R- ICA terminus, R-MCA and R-ACA with stent assisted angioplasty of symptomatic proximal R-ICA stenosis by DR. Deveshwar.  Patient transferred to CIR on 03/09/2019 .  Patient currently requires max with basic self-care skills secondary to muscle weakness, impaired timing and sequencing, unbalanced muscle activation and decreased coordination, decreased attention to left and left side neglect, decreased awareness, decreased problem solving, decreased safety awareness and decreased memory and decreased sitting balance, decreased standing balance, hemiplegia and decreased balance strategies.  Prior to  hospitalization, patient could complete ADLs with independent .  Patient will benefit from skilled intervention to decrease level of assist with basic self-care skills and increase independence with basic self-care skills prior to discharge home to her son's residence with other children and friends helping out.  Anticipate patient will require minimal physical assistance and follow up home health.  OT - End of Session Activity Tolerance: Improving Endurance Deficit: Yes OT Assessment Rehab Potential (ACUTE ONLY): Excellent OT Patient demonstrates impairments in the following area(s): Balance;Motor;Perception;Safety;Cognition OT Basic ADL's Functional Problem(s): Eating;Grooming;Bathing;Dressing;Toileting OT Transfers Functional Problem(s): Toilet;Tub/Shower OT Additional Impairment(s): Fuctional Use of Upper Extremity OT Plan OT Intensity: Minimum of 1-2 x/day, 45 to 90 minutes OT Frequency: 5 out of 7 days OT Duration/Estimated Length of Stay: 18-21 days OT Treatment/Interventions: Balance/vestibular training;Discharge planning;Functional electrical stimulation;Pain management;Self Care/advanced ADL retraining;Therapeutic Activities;UE/LE Coordination activities;Cognitive remediation/compensation;Functional mobility training;Patient/family education;Therapeutic Exercise;Visual/perceptual remediation/compensation;Community reintegration;DME/adaptive equipment instruction;Neuromuscular re-education;Splinting/orthotics;UE/LE Strength taining/ROM;Wheelchair propulsion/positioning OT Self Feeding Anticipated Outcome(s): Modified independent OT Basic Self-Care Anticipated Outcome(s): min guard OT Toileting Anticipated Outcome(s): min guard OT Bathroom Transfers Anticipated Outcome(s): min guard OT Recommendation Patient destination: Home Follow Up Recommendations: Home health OT;24 hour supervision/assistance Equipment Recommended: To be determined   Skilled Therapeutic Intervention Pt  completed selfcare retraining sit to stand at the sink during session.  She was able to transfer supine to sit with mod assist and then completed stand pivot transfer to the wheelchair with max assist.  Noted pt with increased head turn to the right in the bed and when sitting in the wheelchair.  She completed UB bathing with min assist and mod instructional cueing.  Mod assist for LB bathing as well.  Mod assist for UB dressing with max assist for LB dressing sit to stand.  Pt with max hand over hand using the LUE for washing the right arm and for applying deodorant.  Finished session with transfer back to the bed with max assist stand pivot.  Educated pt and son on positioning of the LUE in bed using pillows.  Call button and phone in reach and bed alarm in place.    OT Evaluation Precautions/Restrictions  Precautions Precautions: Fall Restrictions Weight Bearing Restrictions: No  Pain Pain Assessment Pain Scale: Faces Pain Score: 0-No pain Home Living/Prior Functioning Home Living Family/patient expects to be discharged to:: Private residence Living Arrangements: Children Available Help at Discharge: Available 24 hours/day, Other (Comment)(She states that she is going to her son's house and will have 24 hr supervision/assis) Type of Home: Mobile home Home Access: Stairs to enter Entrance Stairs-Number of Steps: 3 Entrance Stairs-Rails: Can reach both Home Layout: One level Bathroom Shower/Tub: Chiropodist: Standard  Lives With: Son IADL History Homemaking Responsibilities: Yes Meal Prep Responsibility: Therapist, occupational Responsibility: Primary Cleaning Responsibility: Primary Bill Paying/Finance Responsibility: Primary Shopping Responsibility: Primary Child Care Responsibility: Primary Current License: Yes Mode of Transportation: Car Prior Function Level of Independence: Independent with gait, Independent with transfers, Independent with homemaking with  ambulation, Other (comment)(needed assistance with picking up stuff due to "lower back issues")  Able to Take Stairs?: Yes Driving: Yes Leisure: Hobbies-yes (Comment) Comments: enjoys spending time with her family including her [place]  ADL ADL Eating: Supervision/safety Where Assessed-Eating: Wheelchair Grooming: Minimal assistance Where Assessed-Grooming: Wheelchair Upper Body Bathing: Minimal assistance Where Assessed-Upper Body Bathing: Wheelchair Lower Body Bathing: Moderate assistance Where Assessed-Lower Body Bathing: Wheelchair Upper Body Dressing: Moderate assistance Where Assessed-Upper Body Dressing: Wheelchair Lower Body Dressing: Maximal assistance Where Assessed-Lower Body Dressing: Wheelchair Toileting: Moderate assistance Where Assessed-Toileting: Bedside Commode Toilet Transfer: Maximal assistance Toilet Transfer Method: Stand pivot Toilet Transfer Equipment: Bedside commode Vision Baseline Vision/History: Wears glasses Wears Glasses: Distance only Patient Visual Report: No change from baseline Vision Assessment?: Yes Eye Alignment: Within Functional Limits Ocular Range of Motion: Within Functional Limits Alignment/Gaze Preference: Within Defined Limits Tracking/Visual Pursuits: Decreased smoothness of horizontal tracking;Decreased smoothness of vertical tracking;Other (comment)(Pt overshoots with tracking as well as exhibiting some lost fixation when tracking to the left of midline.) Visual Fields: No apparent deficits Perception  Perception: Impaired Inattention/Neglect: Does not attend to left side of body Praxis Praxis: Intact Cognition Overall Cognitive Status: Impaired/Different from baseline Arousal/Alertness: Awake/alert Orientation Level: Person;Place;Situation Person: Oriented Place: Oriented Situation: Oriented Year: 2020 Month: December Day of Week: Correct Memory: Impaired(history of baseline deficits per her report) Memory Impairment:  Decreased short term memory Decreased Short Term Memory: Verbal basic;Functional basic Immediate Memory Recall: Sock;Blue;Bed Memory Recall Sock: Without Cue Memory Recall Blue: Without Cue Memory Recall Bed: Without Cue Attention: Sustained Focused Attention: Appears intact Sustained Attention: Impaired Sustained Attention Impairment: Verbal basic;Functional basic Awareness: Impaired Awareness Impairment: (Pt able to state weakness in the left side but unaware of perceptual deficits) Problem Solving: Impaired Problem Solving Impairment: Functional basic;Functional complex Executive Function: (all impaired due to lower level deficits) Behaviors: Impulsive Safety/Judgment: Impaired Sensation Sensation Light Touch: Appears Intact Proprioception: Appears Intact Stereognosis: Not tested Additional Comments: Light touch and proprioception intact in BUEs Coordination Gross Motor Movements are Fluid and Coordinated: No Fine Motor Movements are Fluid and Coordinated: No Coordination and Movement Description: Pt needs max hand over hand for functional use of the LUE during bathing tasks. Motor  Motor Motor: Hemiplegia Motor - Skilled Clinical Observations: LUE and LLE hemiparesis Mobility  Bed Mobility Bed Mobility: Supine to Sit;Sit to Supine Supine to Sit: Moderate Assistance - Patient 50-74% Sit to Supine: Moderate Assistance - Patient 50-74% Transfers Sit to Stand: Moderate Assistance - Patient 50-74%  Trunk/Postural Assessment  Cervical Assessment Cervical Assessment: Exceptions to WFL(keeps head turned slightly to the right) Thoracic Assessment Thoracic Assessment: Exceptions to WFL(thoracic elongation on the left side) Lumbar Assessment Lumbar Assessment: Exceptions to WFL(posterior pelvic tilt) Postural Control Postural Control: Deficits on evaluation Trunk Control: decreased with LOB to the left  Balance Balance Balance Assessed: Yes Static Sitting Balance Static  Sitting - Balance Support: Feet supported Static Sitting - Level of Assistance: 5: Stand by assistance Dynamic Sitting Balance Dynamic Sitting - Balance Support: During functional activity Dynamic Sitting - Level of Assistance: 4: Min assist Static Standing Balance Static Standing - Balance Support: During functional activity Static Standing - Level of Assistance: 3: Mod assist Dynamic Standing Balance Dynamic Standing - Balance Support: During functional activity Dynamic Standing - Level of Assistance: 2: Max assist Extremity/Trunk Assessment RUE Assessment RUE Assessment: Within Functional Limits LUE Assessment LUE Assessment: Exceptions to [facility] Passive Range of Motion (PROM) Comments: WFLs for all joints General Strength Comments: Brunnstrum stage I in the arm and hand currently with no active movement noted     Refer to Care Plan for Long Term Goals  Recommendations for other services: Neuropsych and Therapeutic Recreation  Stress management   Discharge Criteria:  Patient will be discharged from OT if patient refuses treatment 3 consecutive times without medical reason, if treatment goals not met, if there is a change in medical status, if patient makes no progress towards goals or if patient is discharged from hospital.  The above assessment, treatment plan, treatment alternatives and goals were discussed and mutually agreed upon: by patient  Tadeo Besecker OTR/L 03/10/2019, 4:49 PM

## 2019-03-10 NOTE — Evaluation (Signed)
Speech Language Pathology Assessment and Plan  Patient Details  Name: Ann Lopez MRN: [id number] Date of Birth: 06/16/1970  SLP Diagnosis: Cognitive Impairments;Dysphagia  Rehab Potential: Good ELOS: ~3 weeks    Today's Date: 03/10/2019 SLP Individual Time: 6063-0160 SLP Individual Time Calculation (min): 55 min   Problem List:  Patient Active Problem List   Diagnosis Date Noted  . Acute ischemic right middle cerebral artery (MCA) stroke ([place]) 03/09/2019  . Left hemiparesis ([place])   . Bradycardia   . Tachypnea   . SIRS (systemic inflammatory response syndrome) ([facility])   . Hypokalemia   . Diabetes mellitus type 2 in obese ([place])   . Tobacco abuse   . History of breast cancer   . Cognitive deficits   . Dysphagia, post-stroke   . Stroke (cerebrum) ([place]) 03/02/2019  . Middle cerebral artery embolism, right 03/02/2019  . Chest pain 12/19/2015  . Numbness 12/19/2015  . Femoral hernia 03/24/2014  . Dyslipidemia 09/10/2011  . BRCA1 positive 09/10/2011  . Breast cancer ([place]) 09/10/2011  . H/O gastric bypass; 05/14/11([facility]) 09/10/2011  . Essential hypertension 10/28/2008  . DEPRESSION/ANXIETY 02/19/2007  . CARPAL TUNNEL SYNDROME, RIGHT 02/19/2007  . ALLERGIC RHINITIS 02/19/2007  . GERD 02/19/2007  . IRRITABLE BOWEL SYNDROME 02/19/2007  . HERPES GENITALIS 12/30/2006  . Diabetes ([place]) 12/30/2006  . OSTEOPENIA 12/30/2006  . ANOMALY, CONGENITAL, NERVOUS SYSTEM NEC 12/30/2006   Past Medical History:  Past Medical History:  Diagnosis Date  . Allergy   . Anxiety   . Asthma   . Back pain   . Breast cancer ([place])   . Cancer ([place])   . Depression   . Diabetes mellitus   . Headache   . Hyperlipemia   . Osteopenia   . Osteoporosis    osteopenia   Past Surgical History:  Past Surgical History:  Procedure Laterality Date  . ABDOMINAL HYSTERECTOMY     B oophorectomy for BRCA1 gene  . BREAST SURGERY    . CESAREAN SECTION    . INCISION AND DRAINAGE ABSCESS Left 11/14/2012    Procedure: INCISION AND DRAINAGE ABSCESS LEFT GROIN;  Surgeon: Jamesetta So, MD;  Location: [facility];  Service: General;  Laterality: Left;  . IR ANGIO VERTEBRAL SEL SUBCLAVIAN INNOMINATE UNI R MOD SED  03/02/2019  . IR CT HEAD LTD  03/02/2019  . IR INTRAVSC STENT CERV CAROTID W/O EMB-PROT MOD SED INC ANGIO  03/02/2019  . IR PERCUTANEOUS ART THROMBECTOMY/INFUSION INTRACRANIAL INC DIAG ANGIO  03/02/2019  . LYMPHADENECTOMY    . MASTECTOMY Bilateral   . RADIOLOGY WITH ANESTHESIA N/A 03/02/2019   Procedure: IR WITH ANESTHESIA;  Surgeon: Luanne Bras, MD;  Location: [facility];  Service: Radiology;  Laterality: N/A;    Assessment / Plan / Recommendation Clinical Impression   FUX:NATFTDDU [name] is a 48 year old female with history of T2DM, HTN, gastricsleeve, breast cancer s/p bilateral mastectomywith TAH/BSOand post chemo memory deficits, tobacco use, migraines, back pain who was admitted on 03/02/19 after fall from the commode and found to have left sided weakness with left neglect and right gaze preference. UDS positive with opioids and amphetamines. CTA/P head revealed near occlusive thrombus within R-ICA terminus extending into M1 R-MCA with A1 R-ACA with core infarct in R-MCA and large penumbra as well as incidental finding of left thyroid nodule. She was taken for cerebral angio with emergent thrombectomy and complete revascularization of occluded R- ICA terminus, R-MCA and R-ACA with stent assisted angioplasty of symptomatic proximal R-ICA stenosis by DR.  Deveshwar. Post procedure with hypotension felt to be due to carotid manipulation as well as decrease in LOC with increase in LLE tone. EEG showed diffuse encephalopathy and repeat CT head showed some mass effect on right lateral ventricle and was negative for hemorrhage.  She tolerated extubation without difficulty but had bouts of agitation alternating with lethargy. 2D echo showed EF 60-65% with no LVH and no wall abnormality. Most  recent CT head 11/25 showed residual hyperdensity within infarcted right lentiform nucleus c/w resolving confluent petechial hemorrhage and persistent partial effacement of right lateral ventricle. Dr. Erlinda Hong felt that stroke embolic due to large vessel stenosis but PAF not fully ruled out--30 day cardiac event monitor recommended on outpatient basis. Neurology recommending SBP goal 120-140--hypotensive today with SBP in 90's thereforetreated with fluid bolus.Patient with resultant left facial droop with cognitive deficits, left sided weakness and right gaze preference with left neglect affecting mobility and ADLs. Pt was admitted to CIR 03/09/19 and SLP evaluations were completed 03/10/19 with results as follows:  Clinical bedside swallow evaluation: Pt presents with mild oral dysphagia characterized by left anterior loss, prolonged mastication, and left buccal residue of solids. Pt did not exhibit overt s/sx aspiration with intake of thin liquids, however she did exhibit immediate cough X1 with puree and X1 with Dys 3 (soft) solid, suspect due to her verbosity with PO's still in oral cavity - secondary to impaired attention. Pt self-reported family members have been bringing in regular textured food items; SLP educated regarding rationale behind soft/chopped diet recommendation and handed pt menu to identify preferred items that are in compliance with current diet. Recommend pt upgrade to Dysphagia 3 (mech soft) solid diet, continue thin liquids, medications may be taken whole with water, intermittent supervision to ensure use of swallow strategies.  Cognitive-linguistic evaluation: Pt presents with mild-moderate cognitive impairments primarily in the areas of Initiation, sustained attention, basic and mildly complex problem solving, intellectual awareness and short term memory. Pt's short term memory deficits are noted to be baseline (after chemo treatments, per chart review and pt report), however she would  benefit from learning compensatory strategies for recall of important new information. Pt demonstrated difficulty maintaining topic of conversation due to distractibility, even in a controlled quiet environment. She also often required extra time to formulate responses to questions and needed verbal cues for initiation of tasks.  Pt's performance on Cognistat evaluation suggested mild impairments in basic math calculations, visual construction/problem solving, and short term recall. Pt required verbal cues to locate items on the left side of her breakfast tray as well as tiles around midline during visual construction task. In functional conversation pt could only identify "the left side of my body" as change/challenge since stroke. Pt unaware of any cognitive impairments, errors during problem solving tasks, and could not elaborate as would be expected regarding extent/implications of physical limitations which presents a safety concern.   Recommend pt receive skilled ST services while inpatient to address aforementioned dysphagia and cognitive deficits in order to maximize functional independence and reduce burden of care upon her return home.    Skilled Therapeutic Interventions          Clinical bedside swallow and cognitive-linguistic evaluations were completed (see above for details) and results were reviewed with pt. Pt left laying in bed with alarm set and MD present.    SLP Assessment  Patient will need skilled Speech Lanaguage Pathology Services during CIR admission           SLP Frequency 3  to 5 out of 7 days   SLP Duration  SLP Intensity  SLP Treatment/Interventions ~3 weeks  Minumum of 1-2 x/day, 30 to 90 minutes  Cognitive remediation/compensation;Cueing hierarchy;Dysphagia/aspiration precaution training;Functional tasks;Patient/family education;Internal/external aids;Environmental controls    Pain Pain Assessment Pain Scale: 0-10 Pain Score: 0-No pain  Prior  Functioning Type of Home: Mobile home(reports she may go to 1 of her children's houses)  Lives With: Son(15y.o. son) Available Help at Discharge: Available 24 hours/day(reports she has 3 other boys 21y.o., 26y.o. and 28y.o. and her best friend & neighbor who can take shifts to provide 24hr support)  SLP Evaluation Cognition Overall Cognitive Status: Impaired/Different from baseline(although short term memory deficits are baseline (after chemo)) Arousal/Alertness: Awake/alert Orientation Level: Oriented X4 Attention: Sustained Focused Attention: Appears intact Sustained Attention: Impaired Sustained Attention Impairment: Verbal basic;Functional basic Memory: Impaired Memory Impairment: Decreased short term memory Decreased Short Term Memory: Verbal basic;Functional basic Awareness: Impaired Awareness Impairment: Intellectual impairment Problem Solving: Impaired Problem Solving Impairment: Verbal basic;Functional basic Executive Function: (all impaired due to lower level deficits) Behaviors: Impulsive Safety/Judgment: Impaired  Comprehension Auditory Comprehension Overall Auditory Comprehension: Appears within functional limits for tasks assessed Yes/No Questions: Within Functional Limits Commands: Within Functional Limits Conversation: Simple Visual Recognition/Discrimination Discrimination: Not tested Reading Comprehension Reading Status: Not tested Expression Expression Primary Mode of Expression: Verbal Verbal Expression Overall Verbal Expression: Appears within functional limits for tasks assessed Initiation: Impaired Repetition: No impairment Naming: No impairment Pragmatics: No impairment Interfering Components: Attention Non-Verbal Means of Communication: Not applicable Written Expression Dominant Hand: Right Written Expression: Within Functional Limits Oral Motor Oral Motor/Sensory Function Overall Oral Motor/Sensory Function: Mild impairment Facial ROM:  Within Functional Limits Facial Symmetry: Abnormal symmetry left Facial Strength: Reduced left Lingual Symmetry: Abnormal symmetry left Lingual Strength: Reduced Velum: Within Functional Limits Mandible: Within Functional Limits Motor Speech Overall Motor Speech: Appears within functional limits for tasks assessed Respiration: Within functional limits Phonation: Normal Resonance: Within functional limits Intelligibility: Intelligible Motor Planning: Witnin functional limits Motor Speech Errors: Not applicable   Bedside Swallowing Assessment General Date of Onset: 03/02/19 Previous Swallow Assessment: BSE 03/03/19 Diet Prior to this Study: Dysphagia 2 (chopped);Thin liquids Temperature Spikes Noted: N/A Respiratory Status: Room air History of Recent Intubation: Yes Length of Intubations (days): (<1 day - for procedure only) Date extubated: 03/02/19 Behavior/Cognition: Cooperative;Lethargic/Drowsy;Distractible Oral Cavity - Dentition: Adequate natural dentition Self-Feeding Abilities: Able to feed self Patient Positioning: Upright in bed Baseline Vocal Quality: Normal Volitional Cough: Strong Volitional Swallow: Able to elicit  Oral Care Assessment Does patient have any of the following "high(er) risk" factors?: None of the above Does patient have any of the following "at risk" factors?: Other - dysphagia Patient is LOW RISK: Follow universal precautions (see row information) Ice Chips Ice chips: Not tested Thin Liquid Thin Liquid: Within functional limits Presentation: Cup;Straw Nectar Thick Nectar Thick Liquid: Not tested Honey Thick Honey Thick Liquid: Not tested Puree Puree: Impaired Presentation: Self Fed;Spoon Oral Phase Impairments: Reduced labial seal;Reduced lingual movement/coordination Oral Phase Functional Implications: Left anterior spillage Solid Solid: Impaired Presentation: Self Fed Oral Phase Functional Implications: Prolonged oral  transit;Impaired mastication;Oral residue Pharyngeal Phase Impairments: Cough - Immediate BSE Assessment Risk for Aspiration Impact on safety and function: Mild aspiration risk Other Related Risk Factors: Previous CVA;Cognitive impairment  Short Term Goals: Week 1: SLP Short Term Goal 1 (Week 1): Pt will consume Dysphagia 3/thin liquid diet with min overt s/sx aspiration and efficient mastication and oral clearance with no more than Min A cues SLP  Short Term Goal 2 (Week 1): Pt will attend to left visual field of environment during functional tasks with Mod A verbal cues SLP Short Term Goal 3 (Week 1): Pt will demonstrate ability to problem solve during basic and mildly complex tasks with Min A verbal/visual cues SLP Short Term Goal 4 (Week 1): Pt will demonstrate sustained attention to functional tasks for 5 minute intervals with Mod A verbal cues for redirection SLP Short Term Goal 5 (Week 1): Pt will identify 1 physical and 1 cognitive deficits with Mod A verbal cues SLP Short Term Goal 6 (Week 1): Pt will demonstrate functional recall of daily information with Mod A for use of compensatory memory aids/strategies  Refer to Care Plan for Long Term Goals  Recommendations for other services: Neuropsych  Discharge Criteria: Patient will be discharged from SLP if patient refuses treatment 3 consecutive times without medical reason, if treatment goals not met, if there is a change in medical status, if patient makes no progress towards goals or if patient is discharged from hospital.  The above assessment, treatment plan, treatment alternatives and goals were discussed and mutually agreed upon: by patient  Arbutus Leas 03/10/2019, 9:54 AM

## 2019-03-10 NOTE — Progress Notes (Signed)
Speech Language Pathology Daily Session Note  Patient Details  Name: Ann Lopez MRN: [id number] Date of Birth: 1970/05/07  Today's Date: 03/10/2019 SLP Individual Time: 1027-1057 SLP Individual Time Calculation (min): 30 min  Short Term Goals: Week 1: SLP Short Term Goal 1 (Week 1): Pt will consume Dysphagia 3/thin liquid diet with min overt s/sx aspiration and efficient mastication and oral clearance with no more than Min A cues SLP Short Term Goal 2 (Week 1): Pt will attend to left visual field of environment during functional tasks with Mod A verbal cues SLP Short Term Goal 3 (Week 1): Pt will demonstrate ability to problem solve during basic and mildly complex tasks with Min A verbal/visual cues SLP Short Term Goal 4 (Week 1): Pt will demonstrate sustained attention to functional tasks for 5 minute intervals with Mod A verbal cues for redirection SLP Short Term Goal 5 (Week 1): Pt will identify 1 physical and 1 cognitive deficits with Mod A verbal cues SLP Short Term Goal 6 (Week 1): Pt will demonstrate functional recall of daily information with Mod A for use of compensatory memory aids/strategies  Skilled Therapeutic Interventions: Pt was seen for skilled ST targeting cognitive goals. Pt completed a symbol cancellation task to address visual scanning left to right, which she completed Mod I. During a basic novel card task (11 up) pt required Mod A faded to Min A verbal cues for locating cards in far left visual field, but completed basic calculations (2 cards to = 11) with no more than Min A verbal cues for problem solving. SLP further facilitated session with overall Supervision A verbal cues for problem solving during a basic money management activity (ALFA). Mod A verbal cues required for initiation and redirection to tasks throughout session; pt intermittently interrupted clinician during education regarding left inattention; unclear whether this was behavioral or due to decreased  sustained attention (or combination). Pt continued to exhibit decreased awareness and functional problem solving; she was leaning heavily to left side while sitting in chair and required verbal cues to problem solve repositioning; she also stated belief she was only leaning because she was tired, denied it was due to weakness. Pt was left sitting in wheelchair with seatbelt alarm in place, needs within reach. Continue per current plan of care.       Pain Pain Assessment Pain Scale: 0-10 Pain Score: 0-No pain  Therapy/Group: Individual Therapy  Arbutus Leas 03/10/2019, 12:02 PM

## 2019-03-10 NOTE — Progress Notes (Signed)
Inpatient Rehabilitation  Patient information reviewed and entered into eRehab system by Tylor Gambrill M. Alinah Sheard, M.A., CCC/SLP, PPS Coordinator.  Information including medical coding, functional ability and quality indicators will be reviewed and updated through discharge.    

## 2019-03-10 NOTE — Plan of Care (Signed)
  Problem: Consults Goal: RH STROKE PATIENT EDUCATION Description: See Patient Education module for education specifics  Outcome: Progressing Goal: Nutrition Consult-if indicated Outcome: Progressing Goal: Diabetes Guidelines if Diabetic/Glucose > 140 Description: If diabetic or lab glucose is > 140 mg/dl - Initiate Diabetes/Hyperglycemia Guidelines & Document Interventions  Outcome: Progressing   Problem: RH BOWEL ELIMINATION Goal: RH STG MANAGE BOWEL WITH ASSISTANCE Description: STG Manage Bowel with mod Assistance. Outcome: Progressing   Problem: RH BLADDER ELIMINATION Goal: RH STG MANAGE BLADDER WITH ASSISTANCE Description: STG Manage Bladder With mod Assistance Outcome: Progressing   Problem: RH SKIN INTEGRITY Goal: RH STG MAINTAIN SKIN INTEGRITY WITH ASSISTANCE Description: STG Maintain Skin Integrity With min Assistance. Outcome: Progressing   Problem: RH SAFETY Goal: RH STG ADHERE TO SAFETY PRECAUTIONS W/ASSISTANCE/DEVICE Description: STG Adhere to Safety Precautions With mod Assistance and appropriate assistive Device. Outcome: Progressing   Problem: RH PAIN MANAGEMENT Goal: RH STG PAIN MANAGED AT OR BELOW PT'S PAIN GOAL Description: <3 on a 0-10 pain scale Outcome: Progressing   Problem: RH KNOWLEDGE DEFICIT Goal: RH STG INCREASE KNOWLEDGE OF DIABETES Description: Patient will be able to demonstrate knowledge of diabetes medication, dietary restrictions, and follow up care with the MD post discharge with min assist from staff. Outcome: Progressing Goal: RH STG INCREASE KNOWLEDGE OF HYPERTENSION Description: Patient will be able to demonstrate knowledge of HTN medication, dietary restrictions, and follow up care with the MD post discharge with min assist from staff.  Outcome: Progressing Goal: RH STG INCREASE KNOWLEGDE OF HYPERLIPIDEMIA Description: Patient will be able to demonstrate knowledge of HLD medication, dietary restrictions, and follow up care with the  MD post discharge with min assist from staff.  Outcome: Progressing Goal: RH STG INCREASE KNOWLEDGE OF STROKE PROPHYLAXIS Description: Patient Outcome: Progressing

## 2019-03-11 ENCOUNTER — Inpatient Hospital Stay (HOSPITAL_COMMUNITY): Payer: Medicare Other | Admitting: Physical Therapy

## 2019-03-11 ENCOUNTER — Inpatient Hospital Stay (HOSPITAL_COMMUNITY): Payer: Medicare Other

## 2019-03-11 ENCOUNTER — Inpatient Hospital Stay (HOSPITAL_COMMUNITY): Payer: Medicare Other | Admitting: Occupational Therapy

## 2019-03-11 ENCOUNTER — Encounter (HOSPITAL_COMMUNITY): Payer: Medicare Other | Admitting: Psychology

## 2019-03-11 DIAGNOSIS — G3184 Mild cognitive impairment, so stated: Secondary | ICD-10-CM

## 2019-03-11 DIAGNOSIS — F331 Major depressive disorder, recurrent, moderate: Secondary | ICD-10-CM

## 2019-03-11 LAB — GLUCOSE, CAPILLARY
Glucose-Capillary: 110 mg/dL — ABNORMAL HIGH (ref 70–99)
Glucose-Capillary: 134 mg/dL — ABNORMAL HIGH (ref 70–99)
Glucose-Capillary: 144 mg/dL — ABNORMAL HIGH (ref 70–99)
Glucose-Capillary: 99 mg/dL (ref 70–99)

## 2019-03-11 MED ORDER — BLOOD PRESSURE CONTROL BOOK
Freq: Once | Status: AC
  Administered 2019-03-11: 09:00:00
  Filled 2019-03-11: qty 1

## 2019-03-11 MED ORDER — LIVING WELL WITH DIABETES BOOK
Freq: Once | Status: AC
  Administered 2019-03-11: 09:00:00
  Filled 2019-03-11: qty 1

## 2019-03-11 NOTE — Progress Notes (Addendum)
[place] PHYSICAL MEDICINE & REHABILITATION PROGRESS NOTE   Subjective/Complaints:  No issues overnite Discussed psych hx with neuropsychology  ROS:Neg CP, SOB, N/V/D   Objective:   Vas [place] Lower Extremity Venous (dvt)  Result Date: 03/10/2019  Lower Venous Study Indications: Swelling, and stroke.  Comparison Study: no prior Performing Technologist: June Leap RDMS, RVT  Examination Guidelines: A complete evaluation includes B-mode imaging, spectral Doppler, color Doppler, and power Doppler as needed of all accessible portions of each vessel. Bilateral testing is considered an integral part of a complete examination. Limited examinations for reoccurring indications may be performed as noted.  +---------+---------------+---------+-----------+----------+--------------+ RIGHT    CompressibilityPhasicitySpontaneityPropertiesThrombus Aging +---------+---------------+---------+-----------+----------+--------------+ CFV      Full           Yes      Yes                                 +---------+---------------+---------+-----------+----------+--------------+ SFJ      Full                                                        +---------+---------------+---------+-----------+----------+--------------+ FV Prox  Full                                                        +---------+---------------+---------+-----------+----------+--------------+ FV Mid   Full                                                        +---------+---------------+---------+-----------+----------+--------------+ FV DistalFull                                                        +---------+---------------+---------+-----------+----------+--------------+ PFV      Full                                                        +---------+---------------+---------+-----------+----------+--------------+ POP      Full           Yes      Yes                                  +---------+---------------+---------+-----------+----------+--------------+ PTV      Full                                                        +---------+---------------+---------+-----------+----------+--------------+ PERO  Full                                                        +---------+---------------+---------+-----------+----------+--------------+   +---------+---------------+---------+-----------+----------+--------------+ LEFT     CompressibilityPhasicitySpontaneityPropertiesThrombus Aging +---------+---------------+---------+-----------+----------+--------------+ CFV      Full           Yes      Yes                                 +---------+---------------+---------+-----------+----------+--------------+ SFJ      Full                                                        +---------+---------------+---------+-----------+----------+--------------+ FV Prox  Full                                                        +---------+---------------+---------+-----------+----------+--------------+ FV Mid   Full                                                        +---------+---------------+---------+-----------+----------+--------------+ FV DistalFull                                                        +---------+---------------+---------+-----------+----------+--------------+ PFV      Full                                                        +---------+---------------+---------+-----------+----------+--------------+ POP      Full           Yes      Yes                                 +---------+---------------+---------+-----------+----------+--------------+ PTV      Full                                                        +---------+---------------+---------+-----------+----------+--------------+ PERO     Full                                                         +---------+---------------+---------+-----------+----------+--------------+  Summary: Right: There is no evidence of deep vein thrombosis in the lower extremity. No cystic structure found in the popliteal fossa. Left: There is no evidence of deep vein thrombosis in the lower extremity. No cystic structure found in the popliteal fossa.  *See table(s) above for measurements and observations. Electronically signed by Deitra Mayo MD on 03/10/2019 at 9:24:38 AM.    Final    Recent Labs    03/10/19 0441  WBC 9.3  HGB 12.9  HCT 38.9  PLT 320   Recent Labs    03/10/19 0441  NA 139  K 4.1  CL 105  CO2 26  GLUCOSE 116*  BUN 9  CREATININE 0.63  CALCIUM 9.0    Intake/Output Summary (Last 24 hours) at 03/11/2019 0840 Last data filed at 03/10/2019 1844 Gross per 24 hour  Intake 472 ml  Output -  Net 472 ml     Physical Exam: Vital Signs Blood pressure (!) 92/57, pulse (!) 56, temperature 98.3 F (36.8 C), resp. rate 18, height 5\' 3"  (1.6 m), weight 87.9 kg, SpO2 99 %.   General: No acute distress Mood and affect are appropriate Heart: Regular rate and rhythm no rubs murmurs or extra sounds Lungs: Clear to auscultation, breathing unlabored, no rales or wheezes Abdomen: Positive bowel sounds, soft nontender to palpation, nondistended Extremities: No clubbing, cyanosis, or edema Skin: No evidence of breakdown, no evidence of rash Neurologic: Cranial nerves II through XII intact, motor strength is 5/5 in right  deltoid, bicep, tricep, grip, hip flexor, knee extensors, ankle dorsiflexor and plantar flexor 0/5 in LUE and 3- LLE  Sensory exam normal sensation to light touch and proprioception in bilateral upper and lower extremities Cerebellar exam normal finger to nose to finger as well as heel to shin in bilateral upper and lower extremities Musculoskeletal: Full range of motion in all 4 extremities. No joint swelling   Assessment/Plan: 1. Functional deficits secondary to  Right MCA infarct which require 3+ hours per day of interdisciplinary therapy in a comprehensive inpatient rehab setting.  Physiatrist is providing close team supervision and 24 hour management of active medical problems listed below.  Physiatrist and rehab team continue to assess barriers to discharge/monitor patient progress toward functional and medical goals  Care Tool:  Bathing    Body parts bathed by patient: Left arm, Chest, Abdomen, Right upper leg, Left upper leg, Right lower leg, Face   Body parts bathed by helper: Left lower leg, Front perineal area, Buttocks, Right arm     Bathing assist Assist Level: Moderate Assistance - Patient 50 - 74%     Upper Body Dressing/Undressing Upper body dressing   What is the patient wearing?: Pull over shirt    Upper body assist Assist Level: Moderate Assistance - Patient 50 - 74%    Lower Body Dressing/Undressing Lower body dressing      What is the patient wearing?: Underwear/pull up     Lower body assist Assist for lower body dressing: Maximal Assistance - Patient 25 - 49%     Toileting Toileting    Toileting assist Assist for toileting: 2 Helpers     Transfers Chair/bed transfer  Transfers assist     Chair/bed transfer assist level: Maximal Assistance - Patient 25 - 49%     Locomotion Ambulation   Ambulation assist   Ambulation activity did not occur: Safety/medical concerns  Assist level: 2 helpers Assistive device: Other (comment)(hallway rail) Max distance: 59ft   Walk 10 feet activity  Assist     Assist level: 2 helpers Assistive device: Other (comment)(hallway rail)   Walk 50 feet activity   Assist Walk 50 feet with 2 turns activity did not occur: Safety/medical concerns         Walk 150 feet activity   Assist Walk 150 feet activity did not occur: Safety/medical concerns         Walk 10 feet on uneven surface  activity   Assist Walk 10 feet on uneven surfaces activity did  not occur: Safety/medical concerns         Wheelchair     Assist Will patient use wheelchair at discharge?: (TBD) Type of Wheelchair: Manual    Wheelchair assist level: Set up assist, Minimal Assistance - Patient > 75% Max wheelchair distance: 61ft    Wheelchair 50 feet with 2 turns activity    Assist        Assist Level: Set up assist, Minimal Assistance - Patient > 75%   Wheelchair 150 feet activity     Assist  Wheelchair 150 feet activity did not occur: Safety/medical concerns       Blood pressure (!) 92/57, pulse (!) 56, temperature 98.3 F (36.8 C), resp. rate 18, height 5\' 3"  (1.6 m), weight 87.9 kg, SpO2 99 %.  Medical Problem List and Plan: 1.Left hemiparesis and functional deficitssecondary to right MCA infarct after right ICA,MCA, ACA occlusion. Pt s/p reperfusion/stenting---embolic pattern of stroke -team conf in am  -ELOS/Goals: 21-25 days             -left PRAFO/RWHO at HS 2. Antithrombotics: -DVT/anticoagulation:Pharmaceutical:Lovenoxadded 11/30. Will order dopplers for work up.  -antiplatelet therapy: On ASA/Brillita. 3.Chronic thoracic/ shoulder pain/Pain Management:On hydrocodone rn. 4. Mood:LCSW to follow for evaluation and support. Will order sleep wake chart. -antipsychotic agents: N/A 5. Neuropsych: This patientiscapable of making decisions on herown behalf. 6. Skin/Wound Care:Routine pressure relief measures. 7. Fluids/Electrolytes/Nutrition:Monitor I/O--check lytes in am.  8. R-ICAthrombus s/pstent: On Brillinta and ASA. Monitor CBC serially. Avoid hypotension. 9. Leucocytosis: Trending down with WBC 20-->10.8on 11/30. 10. Hypomagnesemia:Recheck levels in am. Likely needs multiple vitamins due to history of gastric bypass. 11. T2DM: Hgb A1C- 9.0. Was on lantus 40 units with meal coverage and invokana PTA.  CBG (last 3)  Recent Labs    03/10/19 1657  03/10/19 2110 03/11/19 0605  GLUCAP 132* 186* 110*  on Lantus 15U and Invokana controlled  12. Left thyroid nodule/Pulmonary nodules: Per CT neck 03/02/2019. Non-emergent thyroid ultrasound for follow up recommended.  13. COPD: On breo daily with albuterol prn.  14. Anxiety/depression: On Desvenlafaxine and clonazepam PTA?Now on Cymbalta 60 mg/day with Seroquel 12.5 mg am/25 mg HS---will discontinue Seroquel as has not been taking it consistently and continues to report issues with insomnia. Will resume Klonopin prn as at home andobserve. Keep sleep chart 13. Bradycardia: HR's has been down to high 40's in the past 24 hours. Asymptomatic? Will monitor for now.  14. Recurrent hypotension:Received 500 cc IVF bolus this am. Monitor BP tid for now. Vitals:   03/10/19 1954 03/11/19 0558  BP: 101/61 (!) 92/57  Pulse: (!) 59 (!) 56  Resp: 17 18  Temp: 98.3 F (36.8 C) 98.3 F (36.8 C)  SpO2: 96% 99%  BP a little soft , check ortho BPs, fluid intake improving   15. Left breast cancer s/p bilateral mastectomy:    LOS: 2 days A FACE TO FACE EVALUATION WAS PERFORMED  Charlett Blake 03/11/2019, 8:40 AM

## 2019-03-11 NOTE — Progress Notes (Signed)
Speech Language Pathology Daily Session Note  Patient Details  Name: Ann Lopez MRN: [id number] Date of Birth: Jun 25, 1970  Today's Date: 03/11/2019 SLP Individual Time: 0900-0958 SLP Individual Time Calculation (min): 58 min  Short Term Goals: Week 1: SLP Short Term Goal 1 (Week 1): Pt will consume Dysphagia 3/thin liquid diet with min overt s/sx aspiration and efficient mastication and oral clearance with no more than Min A cues SLP Short Term Goal 2 (Week 1): Pt will attend to left visual field of environment during functional tasks with Mod A verbal cues SLP Short Term Goal 3 (Week 1): Pt will demonstrate ability to problem solve during basic and mildly complex tasks with Min A verbal/visual cues SLP Short Term Goal 4 (Week 1): Pt will demonstrate sustained attention to functional tasks for 5 minute intervals with Mod A verbal cues for redirection SLP Short Term Goal 5 (Week 1): Pt will identify 1 physical and 1 cognitive deficits with Mod A verbal cues SLP Short Term Goal 6 (Week 1): Pt will demonstrate functional recall of daily information with Mod A for use of compensatory memory aids/strategies  Skilled Therapeutic Interventions: Skilled ST services focused on swallow and cognitive skills. Pt demonstrated recall of yesterday's ST events with min A verbal cues, Pt demonstrated ability to identify physical deficits and impact of deficits, left weakness, unable to ambulate, however denied cognitive deficits; in which she demonstrated limited deficits in today's session. SLP facilitated basic to semi-complex problem solving, error awareness and left scanning in novel PEG design task, pt demonstrated self-correction most errors and required supervision A verbal cues for error awareness in semi-complex task, mod I left scanning. Pt demonstrated sustained attention in 10 minute intervals, with intermittent lethargy (eye closing at the end of each task.) Pt demonstrated semi-complex problem  solving skills utilizing ALFA medication management and verbal problem solving 10/10 questions mod I. Pt appears to demonstrate increase cognitive skills in semi-complex problem solving, emergent awareness, recall, sustained attention and left attention. Pt consumed dys 3 textured snack with supervision A verbal cues to clear mild left buccal pocketing with liquid wash, no overt s/s aspiration. Pt was left in room with call bell within reach and bed alarm set. ST recommends to continue skilled ST services.      Pain Pain Assessment Pain Score: 0-No pain  Therapy/Group: Individual Therapy  Tobie Hellen  [facility] 03/11/2019, 10:37 AM

## 2019-03-11 NOTE — Progress Notes (Signed)
Occupational Therapy Session Note  Patient Details  Name: Ann Lopez MRN: [id number] Date of Birth: [birth year]  Today's Date: 03/11/2019 OT Individual Time: 1400-1500 OT Individual Time Calculation (min): 60 min    Short Term Goals: Week 1:  OT Short Term Goal 1 (Week 1): Pt will complete UB dressing with min assist. OT Short Term Goal 2 (Week 1): Pt will complete LB bathing sit to stand with min assist. OT Short Term Goal 3 (Week 1): Pt will complete LB dressing sit to stand. OT Short Term Goal 4 (Week 1): Pt will complete toilet transfers with mod assist stand pivot. Week 2:     Skilled Therapeutic Interventions/Progress Updates:    1:1 Self care retraining at shower level. Pt was in bed when arrived. Focus on bed mobility with a flat bed wtihout use of the rail with mod A with extra time and instructional cues for sequencing. Mod A squat pivot bed<w/c<toilet . Toileting with max A sit to stand with mod A. Pt with tendency to roll left ankle requiring facilitation to not allow this. Use the STEDY to access shower bench - facing outward. Focus on functional use of left hand with max A with HHA. Environment setup to promote visual attention to the left to obtain items needed. In w/c focus on dressing with hemi techniques (donning dress/ night gown and underwear) with mod A sit to stand. Pt trnasfered with mod A into recliner for more comfortable prolonged sitting. Propped with pillows for optimal positioning and left UE position in eye sight.   Therapy Documentation Precautions:  Precautions Precautions: Fall Other Brace: L ankle wrapped with ACE for df assist Restrictions Weight Bearing Restrictions: No General:   Vital Signs: Therapy Vitals Temp: 98.3 F (36.8 C) Pulse Rate: 63 Resp: 17 BP: (!) 100/57 Patient Position (if appropriate): Lying Oxygen Therapy SpO2: 98 % O2 Device: Room Air Pain:  no c/o pain   Therapy/Group: Individual Therapy  Willeen Cass  [facility] 03/11/2019, 4:49 PM

## 2019-03-11 NOTE — Patient Care Conference (Signed)
Inpatient RehabilitationTeam Conference and Plan of Care Update Date: 03/11/2019   Time: 10:40 AM   Patient Name: Ann Lopez      Medical Record Number: [id number]  Date of Birth: [birth year] Sex: Female         Room/Bed: 4W17C/4W17C-01 Payor Info: Payor: [employer] MEDICARE / Plan: [facility] MEDICARE / Product Type: *No Product type* /    Admit Date/Time:  03/09/2019  2:20 PM  Primary Diagnosis:  <principal problem not specified>  Patient Active Problem List   Diagnosis Date Noted  . Major depressive disorder, recurrent episode, moderate ([place])   . MCI (mild cognitive impairment) with memory loss   . Acute ischemic right middle cerebral artery (MCA) stroke ([place]) 03/09/2019  . Left hemiparesis ([place])   . Bradycardia   . Tachypnea   . SIRS (systemic inflammatory response syndrome) ([facility])   . Hypokalemia   . Diabetes mellitus type 2 in obese ([place])   . Tobacco abuse   . History of breast cancer   . Cognitive deficits   . Dysphagia, post-stroke   . Stroke (cerebrum) ([place]) 03/02/2019  . Middle cerebral artery embolism, right 03/02/2019  . Chest pain 12/19/2015  . Numbness 12/19/2015  . Femoral hernia 03/24/2014  . Dyslipidemia 09/10/2011  . BRCA1 positive 09/10/2011  . Breast cancer ([place]) 09/10/2011  . H/O gastric bypass; 05/14/11([facility]) 09/10/2011  . Essential hypertension 10/28/2008  . DEPRESSION/ANXIETY 02/19/2007  . CARPAL TUNNEL SYNDROME, RIGHT 02/19/2007  . ALLERGIC RHINITIS 02/19/2007  . GERD 02/19/2007  . IRRITABLE BOWEL SYNDROME 02/19/2007  . HERPES GENITALIS 12/30/2006  . Diabetes ([place]) 12/30/2006  . OSTEOPENIA 12/30/2006  . ANOMALY, CONGENITAL, NERVOUS SYSTEM NEC 12/30/2006    Expected Discharge Date: Expected Discharge Date: 03/31/19  Team Members Present: Physician leading conference: Dr. Alysia Penna Social Worker Present: Ovidio Kin, LCSW Nurse Present: Dorien Chihuahua, RN Case manager: Karene Fry, RN PT Present: Michaelene Song, PT OT Present:  Clyda Greener, OT SLP Present: Charolett Bumpers, SLP PPS Coordinator present : Gunnar Fusi, SLP     Current Status/Progress Goal Weekly Team Focus  Bowel/Bladder   Pt continent of bowel and bladder. LBM 03/11/19  Maintain continence.  Toilet patient q 2 hours or prn. Encourage bowel regimen   Swallow/Nutrition/ Hydration   D3/thin, Min A strategies  Mod I  carryover of strategies, education with pt and family regarding D3 diet/rationale (pt reported family is bringing outside food not in compliance with D3)   ADL's   Pt completes Ub bathing with min assist and UB dressing with mod assist.  She needs max assist for LB bathing and dressing sit to stand with max assist for stand pivot transfers.  Slight left neglect  min guard  neuromuscular re-education, balance retraining, transfer training, DME education, therapeutic activity, pt education   Mobility   mod assist bed mobility, mod/max assist transfers, gait 18f at R hallway rail with max assist and +2 w/c follow, unable to progress to stairs  CGA bed mobility and transfers; min assist gait 1074fusing LRAD; and min assist 3 stair navigation using handrails  activity tolerance, L attention, bed mobility, transfers, initiating gait, L LE NMR, pt education   Communication             Safety/Cognition/ Behavioral Observations  Min sustained attention/left scanning, min-supervision A semi-complex problem solving, recall and awareness min A  Supervision-Min  sustained attention, initiation, semi-complex problem solving, compensatory memory strategies, intellecutal awareness, locating items on left   Pain   Occasional  complaints of back pain. PRN Norco given.  <4 out of 10.  Assess for pain q shift and prn. Treat accordingly.   Skin   Incision to groin. open to air. Skin intact  No new breakdown of infection.  Monitor skin q shift and prn.      *See Care Plan and progress notes for long and short-term goals.     Barriers to Discharge   Current Status/Progress Possible Resolutions Date Resolved   Nursing  Inaccessible home environment;Decreased caregiver support;Medical stability;Medication compliance;Lack of/limited family support               PT  Inaccessible home environment;Home environment access/layout                 OT                  SLP                SW                Discharge Planning/Teaching Needs:  Home with multiple people-son's, neighbors and friends helping her. Made aware she will require 24 hr care at DC.      Team Discussion: h/o breast CA/chemo/B mastectomy/in remission, R MCA infarct, DM, poor compliance, Hgb A1c 9.0, seen by mental/behavioral health in the past, neuropsych to see today.  RN cont B/B, BS good, need ed on lantus and diet.  OT min UB, mod LB, max A stand pivot transfers, L neglect, goals minguard.  PT mod bed, mod/max transfers, max A +2 w/c, amb in hall, goals CGA/min A.  SLP Ds, did much better today, goals mod I swallow, cognition goals S/min A, may upgrade goals.   Revisions to Treatment Plan: N/A     Medical Summary Current Status: History of anxiety depression having some adjustment issues.  Still has severe weakness on the left side. Weekly Focus/Goal: Initiating rehabilitation program, build endurance  Barriers to Discharge: Medication compliance;Medical stability  Barriers to Discharge Comments: Patient with elevated hemoglobin A1c, either dietary or medication compliance issues at home Possible Resolutions to Barriers: Diabetic teaching while she is here   Continued Need for Acute Rehabilitation Level of Care: The patient requires daily medical management by a physician with specialized training in physical medicine and rehabilitation for the following reasons: Direction of a multidisciplinary physical rehabilitation program to maximize functional independence : Yes Medical management of patient stability for increased activity during participation in an intensive  rehabilitation regime.: Yes Analysis of laboratory values and/or radiology reports with any subsequent need for medication adjustment and/or medical intervention. : Yes   I attest that I was present, lead the team conference, and concur with the assessment and plan of the team.   Jodell Cipro M 03/11/2019, 10:18 PM  Team conference was held via web/ teleconference due to [place] - 19

## 2019-03-11 NOTE — Progress Notes (Signed)
Physical Therapy Session Note  Patient Details  Name: Ann Lopez MRN: [id number] Date of Birth: August 04, 1970  Today's Date: 03/11/2019 PT Individual Time: [id number] PT Individual Time Calculation (min): 41 min   Short Term Goals: Week 1:  PT Short Term Goal 1 (Week 1): Pt will perform supine<>sit with min assist PT Short Term Goal 2 (Week 1): Pt will perform stand pivot transfers using LRAD with min assist PT Short Term Goal 3 (Week 1): Pt will ambulate at least 33ft using LRAD with mod assist of 1 and +2 w/c follow  Skilled Therapeutic Interventions/Progress Updates:    Pt supine in bed upon PT arrival, agreeable to therapy tx and denies pain. Pt transferred to sitting EOB with min assist, cues for L attention. Pt performed stand pivot to w/c with mod assist towards the R. Pt transported to the gym. Pt transferred squat pivot to the R with mod assist. Seated edge of mat pt worked on L neuro re-ed to perform L knee extension and hip extension with cues for sustained contraction and control. Pt performed sit<>stands from mat throughout session without AD, cues and emphasis on L LE use and symmetric LE weightbearing, pushing up from R UE on L knee, therapist ensuring proper ankle positioning on L. In standing pt worked on knee control and neuro re-ed to perform mini squats 2 x 10 without UE support, facilitation for midline pelvic rotation and mirror for visual feedback, facilitation for L knee position/control. Pt performed x 5 sit<>stands with mirror for visual feedback, pushing R hand over L hand on L knee to facilitate L LE use and weightshift, min-mod assist, in standing facilitation for increased R hip extension. Pt worked on dynamic standing balance, L LE weightbearing and stance control to perform x 10 R lateral steps in place without UE support, cues and facilitation for L quad activation with therapist blocking knee and preventing hyperextension, facilitation for L hip extension as well, mod  assist. Pt performed squat pivot back to w/c mod assist and transported back to room. Squat pivot to bed mod assist, sit>supine mod assist. Pt left supine in bed with PRAFO donned and needs in reach.   Therapy Documentation Precautions:  Precautions Precautions: Fall Restrictions Weight Bearing Restrictions: No    Therapy/Group: Individual Therapy  Netta Corrigan, PT, DPT, CSRS 03/11/2019, 7:54 AM

## 2019-03-11 NOTE — Progress Notes (Signed)
Occupational Therapy Session Note  Patient Details  Name: Ann Lopez MRN: [id number] Date of Birth: 17-Jul-1970  Today's Date: 03/11/2019 OT Individual Time: [id number] OT Individual Time Calculation (min): 29 min    Short Term Goals: Week 1:  OT Short Term Goal 1 (Week 1): Pt will complete UB dressing with min assist. OT Short Term Goal 2 (Week 1): Pt will complete LB bathing sit to stand with min assist. OT Short Term Goal 3 (Week 1): Pt will complete LB dressing sit to stand. OT Short Term Goal 4 (Week 1): Pt will complete toilet transfers with mod assist stand pivot.  Skilled Therapeutic Interventions/Progress Updates:    Pt in bed to start session.  Educated pt on the steps for rolling to the right side.  Mod demonstrational cueing with min assist for rolling to the right.  Mod assist to transition to sitting edge of the bed.  She was able to complete stand pivot transfer to the wheelchair on the left side with mod assist.  She needed mod assist for advancement of the LLE during the transfer.  Once in the wheelchair therapist educated her on self PROM for the LUE with mod demonstrational cueing.  Also provided half lap tray for support as well.  Therapist educated pt on positioning of the LUE and LLE when in the bed and when sitting up.  Handout provided and posted in the room as well.  Finished session with pt in the wheelchair with call button and phone in reach and safety alarm belt in place.    Therapy Documentation Precautions:  Precautions Precautions: Fall Other Brace: L ankle wrapped with ACE for df assist Restrictions Weight Bearing Restrictions: No  Pain: Pain Assessment Pain Scale: Faces Pain Score: 0-No pain ADL: See Care Tool Section for some details of mobility and selfcare  Therapy/Group: Individual Therapy  Lyle Niblett OTR/L 03/11/2019, 10:58 AM

## 2019-03-11 NOTE — Progress Notes (Signed)
Physical Therapy Session Note  Patient Details  Name: Ann Lopez MRN: [id number] Date of Birth: 1971-01-16  Today's Date: 03/11/2019 PT Individual Time: 1035-1105 PT Individual Time Calculation (min): 30 min   Short Term Goals: Week 1:  PT Short Term Goal 1 (Week 1): Pt will perform supine<>sit with min assist PT Short Term Goal 2 (Week 1): Pt will perform stand pivot transfers using LRAD with min assist PT Short Term Goal 3 (Week 1): Pt will ambulate at least 20f using LRAD with mod assist of 1 and +2 w/c follow  Skilled Therapeutic Interventions/Progress Updates: Pt presented in w/c agreeable to therapy. Pt denies pain during session. Pt transported to rehab gym for time management. Performed squat pivot transfer to R modA. Pt noted to have poor placement of LLE after transfer. Provided pt edu on increasing awareness of LLE with transfers in preparation for standing and transfers. PTA obtained youth RW and added L hand orthotic. Pt was able to performed STS x 5 with RW with minA and verbal cues for hand placement. Pt also performed pre-gait activities with RW stepping forward/backwards with RLE with PTA providing verbal cues for maintaining extension in LLE. Pt was able to maintain with first set but knee buckled during second set with PTA providing modA to maintain standing. Performed squat pivot transfer back to w/c in same manner as prior and pt transported back to room. Pt remained in w/c at end of session and left with belt alarm on, call bell within reach and needs met.      Therapy Documentation Precautions:  Precautions Precautions: Fall Other Brace: L ankle wrapped with ACE for df assist Restrictions Weight Bearing Restrictions: No General:   Vital Signs: Therapy Vitals Temp: 98.3 F (36.8 C) Pulse Rate: 63 Resp: 17 BP: (!) 100/57 Patient Position (if appropriate): Lying Oxygen Therapy SpO2: 98 % O2 Device: Room Air   Therapy/Group: Individual Therapy  Kurk Corniel  Blaize Epple, PTA  03/11/2019, 4:01 PM

## 2019-03-11 NOTE — Consult Note (Signed)
Neuropsychological Consultation   Patient:   Ann Lopez   DOB:   1970/11/04  MR Number:  [id number]  Location:  [facility] [id number] [place] [address] Dept: [place]: [phone]           Date of Service:   03/11/2019  Start Time:   8 AM End Time:   9 AM  Provider/Observer:  Ilean Skill, Psy.D.       Clinical Neuropsychologist       Billing Code/Service: [phone]  Chief Complaint:    Ann Lopez is a 48 year old female with a history of type 2 diabetes, hypertension, gastric sleeve, breast cancer with bilateral mastectomy with BRCA gene and strong family history of BRCA gene.  The patient also describes late effects of mild cognitive impairments memory impairments from chemotherapy, tobacco use, migraines, back pain.  The patient was admitted on 03/01/2019 after a fall from the commode and found to have left-sided weakness with left neglect and right gaze preference.  Patient with UDS positive for opioids and amphetamines.  Patient denies amphetamine use and may be FP. CT head has shown infarction right lentiform nucleus and resolving confluent petechial hemorrhae and persistent partial effacement of right lateral ventricle.  Felt to have been embolic in nature due to large vessel stenosis.  Patient with residual left facial droop and cognitive deficits, left sided weakness (improving left leg) and improved right side gaze preference.     Reason for Service:  HCW:CBJSEGBT Ann Lopez is a 48 year old female with history of T2DM, HTN, gastricsleeve, breast cancer s/p bilateral mastectomywith TAH/BSOand post chemo memory deficits, tobacco use, migraines, back pain who was admitted on 03/02/19 after fall from the commode and found to have left sided weakness with left neglect and right gaze preference. UDS positive with opioids and amphetamines. CTA/P head revealed near occlusive  thrombus within R-ICA terminus extending into M1 R-MCA with A1 R-ACA with core infarct in R-MCA and large penumbra as well as incidental finding of left thyroid nodule. She was taken for cerebral angio with emergent thrombectomy and complete revascularization of occluded R- ICA terminus, R-MCA and R-ACA with stent assisted angioplasty of symptomatic proximal R-ICA stenosis by DR. Deveshwar. Post procedure with hypotension felt to be due to carotid manipulation as well as decrease in LOC with increase in LLE tone. EEG showed diffuse encephalopathy and repeat CT head showed some mass effect on right lateral ventricle and was negative for hemorrhage.   She tolerated extubation without difficulty but had bouts of agitation alternating with lethargy. 2D echo showed EF 60-65% with no LVH and no wall abnormality. Most recent CT head 11/25 showed residual hyperdensity within infarcted right lentiform nucleus c/w resolving confluent petechial hemorrhage and persistent partial effacement of right lateral ventricle. Dr. Erlinda Hong felt that stroke embolic due to large vessel stenosis but PAF not fully ruled out--30 day cardiac event monitor recommended on outpatient basis. Neurology recommending SBP goal 120-140--hypotensive today with SBP in 90's thereforetreated with fluid bolus.Patient with resultant left facial droop with cognitive deficits, left sided weakness and right gaze preference with left neglect affecting mobility and ADLs. CIR recommended due to functional decline.  Current Status:  Patient with history of significant depressive events.  Residual memory/MCI due to chemo and bilateral mastectomy.  Has had years of worry about breast cancer and extensive family history of breast cancer.    Behavioral Observation: Ann Lopez  presents as a 48 y.o.-year-old Right Caucasian Female who appeared her stated age. her dress was Appropriate and she was Well Groomed and her manners were Appropriate to the  situation.  her participation was indicative of Appropriate and Attentive behaviors.  There were any physical disabilities noted.  she displayed an appropriate level of cooperation and motivation.     Interactions:    Active Appropriate and Redirectable  Attention:   abnormal and attention span appeared shorter than expected for age  Memory:   abnormal; remote memory intact, recent memory impaired  Visuo-spatial:  not examined but indications of visual neglect  Speech (Volume):  normal  Speech:   normal; normal  Thought Process:  Coherent and Relevant  Though Content:  WNL; not suicidal and not homicidal  Orientation:   person, place, time/date and situation  Judgment:   Fair  Planning:   Fair  Affect:    Appropriate  Mood:    Dysphoric with history of significant depressive events.  Insight:   Fair  Intelligence:   normal   Medical History:   Past Medical History:  Diagnosis Date  . Allergy   . Anxiety   . Asthma   . Back pain   . Breast cancer ([place])   . Cancer ([place])   . Depression   . Diabetes mellitus   . Headache   . Hyperlipemia   . Osteopenia   . Osteoporosis    osteopenia        Psychiatric History:  Past history of depression and anxiety.    Family Med/Psych History:  Family History  Problem Relation Age of Onset  . Cancer Mother 64       breast cancer  . Hypertension Sister   . Hypothyroidism Sister   . Cancer Sister   . Hypothyroidism Brother   . Cancer Maternal Grandmother 43       breast cancer  . Cancer Maternal Grandfather 55       pancreatic cancer    Impression/DX:  Ann Lopez is a 48 year old female with a history of type 2 diabetes, hypertension, gastric sleeve, breast cancer with bilateral mastectomy with BRCA gene and strong family history of BRCA gene.  The patient also describes late effects of mild cognitive impairments memory impairments from chemotherapy, tobacco use, migraines, back pain.  The patient was admitted on  03/01/2019 after a fall from the commode and found to have left-sided weakness with left neglect and right gaze preference.  Patient with UDS positive for opioids and amphetamines.  Patient denies amphetamine use and may be FP. CT head has shown infarction right lentiform nucleus and resolving confluent petechial hemorrhae and persistent partial effacement of right lateral ventricle.  Felt to have been embolic in nature due to large vessel stenosis.  Patient with residual left facial droop and cognitive deficits, left sided weakness (improving left leg) and improved right side gaze preference.  Patient with history of significant depressive events.  Residual memory/MCI due to chemo and bilateral mastectomy.  Has had years of worry about breast cancer and extensive family history of breast cancer.  Disposition/Plan:  Will follow-up next week due to depression        Electronically Signed   _______________________ Ilean Skill, Psy.D.

## 2019-03-11 NOTE — Progress Notes (Signed)
Inpatient Diabetes Program Recommendations  AACE/ADA: New Consensus Statement on Inpatient Glycemic Control (2015)  Target Ranges:  Prepandial:   less than 140 mg/dL      Peak postprandial:   less than 180 mg/dL (1-2 hours)      Critically ill patients:  140 - 180 mg/dL   Lab Results  Component Value Date   GLUCAP 134 (H) 03/11/2019   HGBA1C 9.0 (H) 03/03/2019    Review of Glycemic Control  Results for FAYDRA, SCHWEBEL (MRN [id number]) as of 03/11/2019 15:20  Ref. Range 03/10/2019 11:58 03/10/2019 16:57 03/10/2019 21:10 03/11/2019 06:05 03/11/2019 11:27  Glucose-Capillary Latest Ref Range: 70 - 99 mg/dL 115 (H) 132 (H) 186 (H) 110 (H) 134 (H)    Diabetes history: DM2 Outpatient Diabetes medications: Invokana 300mg  QD Current orders for Inpatient glycemic control: Invokana 300mg  QD + Novolog 0-9 units TID with meals + 0-5 units HS  Note: DM referral received for poor compliance.  Spoke with patient at bedside.  Discussed her A1c of 9% which is an average blood sugar of 212 mg/dL over the last 2-3 months and the long term risks that come along with an elevated A1c. This is down from 11% in July.   Discussed goal range.    Patient states she has been drinking more water lately.  She does like regular [facility].  Encouraged her to switch to [place].  Educated importance of physical activity once permitted by MD.  Patient states over the last 3 years she has rarely used insulin since starting on Invokana.  She is current with Dr. Ival Bible.   CBG's within goal range currently.  No further recommendations.    Thank you, Geoffry Paradise, RN, BSN Diabetes Coordinator Inpatient Diabetes Program [phone] (team pager from 8a-5p)

## 2019-03-11 NOTE — Progress Notes (Signed)
Social Work Patient ID: Ann Lopez, female   DOB: 04/29/1970, 48 y.o.   MRN: [id number]  Met with pt to discuss team conference goals supervision-CGA and target discharge 12/22. Discussed will need to get her caregivers in prior to discharge for training prior to discharge. She will talk with her caregivers and get them all on board with her discharge date. She is doing well and pleased with her progress already, hopeful it will continue.

## 2019-03-12 ENCOUNTER — Inpatient Hospital Stay (HOSPITAL_COMMUNITY): Payer: Medicare Other

## 2019-03-12 ENCOUNTER — Inpatient Hospital Stay (HOSPITAL_COMMUNITY): Payer: Medicare Other | Admitting: Occupational Therapy

## 2019-03-12 ENCOUNTER — Inpatient Hospital Stay (HOSPITAL_COMMUNITY): Payer: Medicare Other | Admitting: Speech Pathology

## 2019-03-12 LAB — GLUCOSE, CAPILLARY
Glucose-Capillary: 111 mg/dL — ABNORMAL HIGH (ref 70–99)
Glucose-Capillary: 107 mg/dL — ABNORMAL HIGH (ref 70–99)
Glucose-Capillary: 92 mg/dL (ref 70–99)
Glucose-Capillary: 97 mg/dL (ref 70–99)

## 2019-03-12 MED ORDER — SENNOSIDES-DOCUSATE SODIUM 8.6-50 MG PO TABS
2.0000 | ORAL_TABLET | Freq: Two times a day (BID) | ORAL | Status: DC
  Administered 2019-03-12 – 2019-03-20 (×15): 2 via ORAL
  Filled 2019-03-12 (×17): qty 2

## 2019-03-12 NOTE — Progress Notes (Addendum)
Speech Language Pathology Daily Session Note  Patient Details  Name: Ann Lopez MRN: [id number] Date of Birth: 08/04/1970  Today's Date: 03/12/2019 SLP Individual Time: [id number] SLP Individual Time Calculation (min): 30 min  Short Term Goals: Week 1: SLP Short Term Goal 1 (Week 1): Pt will consume Dysphagia 3/thin liquid diet with min overt s/sx aspiration and efficient mastication and oral clearance with no more than Min A cues SLP Short Term Goal 2 (Week 1): Pt will attend to left visual field of environment during functional tasks with Mod A verbal cues SLP Short Term Goal 3 (Week 1): Pt will demonstrate ability to problem solve during basic and mildly complex tasks with Min A verbal/visual cues SLP Short Term Goal 4 (Week 1): Pt will demonstrate sustained attention to functional tasks for 5 minute intervals with Mod A verbal cues for redirection SLP Short Term Goal 5 (Week 1): Pt will identify 1 physical and 1 cognitive deficits with Mod A verbal cues SLP Short Term Goal 6 (Week 1): Pt will demonstrate functional recall of daily information with Mod A for use of compensatory memory aids/strategies  Skilled Therapeutic Interventions:  Skilled treatment session focused on dysphagia goals. SLP facilitated session by providing skilled observation of pt consuming regular lunch tray. Pt consumed without overt s/s of aspiration or increased oral residue. Specifically, pt consumed pineapple and grapes without any increase in oral prep times and without any increase in oral residue. Recommend diet upgrade at this time.   Additionally, pt demonstrated increased overall awareness as she verbalized that she didn't need to talk with food in her mouth. She also demonstrates poor awareness as she states that she is already eating regular food brought in by pt's family.      Pain Pain Assessment Pain Scale: Faces Pain Score: 0-No pain  Therapy/Group: Individual Therapy  Keavon Sensing 03/12/2019, 1:05 PM

## 2019-03-12 NOTE — Progress Notes (Signed)
[facility] PHYSICAL MEDICINE & REHABILITATION PROGRESS NOTE   Subjective/Complaints:  No good BMs.  Took colace at home  Had MiraLax yesterday without results No abd pain   ROS:Neg CP, SOB, N/V/D   Objective:   No results found. Recent Labs    03/10/19 0441  WBC 9.3  HGB 12.9  HCT 38.9  PLT 320   Recent Labs    03/10/19 0441  NA 139  K 4.1  CL 105  CO2 26  GLUCOSE 116*  BUN 9  CREATININE 0.63  CALCIUM 9.0    Intake/Output Summary (Last 24 hours) at 03/12/2019 0553 Last data filed at 03/11/2019 1332 Gross per 24 hour  Intake 400 ml  Output -  Net 400 ml     Physical Exam: Vital Signs Blood pressure 95/60, pulse 63, temperature 98.2 F (36.8 C), resp. rate 16, height 5\' 3"  (1.6 m), weight 87.9 kg, SpO2 97 %.   General: No acute distress Mood and affect are appropriate Heart: Regular rate and rhythm no rubs murmurs or extra sounds Lungs: Clear to auscultation, breathing unlabored, no rales or wheezes Abdomen: Positive bowel sounds, soft nontender to palpation, nondistended Extremities: No clubbing, cyanosis, or edema Skin: No evidence of breakdown, no evidence of rash Neurologic: Cranial nerves II through XII intact, motor strength is 5/5 in right  deltoid, bicep, tricep, grip, hip flexor, knee extensors, ankle dorsiflexor and plantar flexor 0/5 in LUE and 3- LLE  Sensory exam normal sensation to light touch and proprioception in bilateral upper and lower extremities Cerebellar exam normal finger to nose to finger as well as heel to shin in bilateral upper and lower extremities Musculoskeletal: Full range of motion in all 4 extremities. No joint swelling   Assessment/Plan: 1. Functional deficits secondary to Right MCA infarct which require 3+ hours per day of interdisciplinary therapy in a comprehensive inpatient rehab setting.  Physiatrist is providing close team supervision and 24 hour management of active medical problems listed below.  Physiatrist  and rehab team continue to assess barriers to discharge/monitor patient progress toward functional and medical goals  Care Tool:  Bathing    Body parts bathed by patient: Left arm, Chest, Abdomen, Right upper leg, Left upper leg, Right lower leg, Face, Front perineal area   Body parts bathed by helper: Left lower leg, Front perineal area, Buttocks, Right arm     Bathing assist Assist Level: Minimal Assistance - Patient > 75%     Upper Body Dressing/Undressing Upper body dressing   What is the patient wearing?: Dress    Upper body assist Assist Level: Minimal Assistance - Patient > 75%    Lower Body Dressing/Undressing Lower body dressing      What is the patient wearing?: Underwear/pull up     Lower body assist Assist for lower body dressing: Maximal Assistance - Patient 25 - 49%     Toileting Toileting    Toileting assist Assist for toileting: Maximal Assistance - Patient 25 - 49%     Transfers Chair/bed transfer  Transfers assist     Chair/bed transfer assist level: Moderate Assistance - Patient 50 - 74%     Locomotion Ambulation   Ambulation assist   Ambulation activity did not occur: Safety/medical concerns  Assist level: 2 helpers Assistive device: Other (comment)(hallway rail) Max distance: 79ft   Walk 10 feet activity   Assist     Assist level: 2 helpers Assistive device: Other (comment)(hallway rail)   Walk 50 feet activity   Assist Walk  50 feet with 2 turns activity did not occur: Safety/medical concerns         Walk 150 feet activity   Assist Walk 150 feet activity did not occur: Safety/medical concerns         Walk 10 feet on uneven surface  activity   Assist Walk 10 feet on uneven surfaces activity did not occur: Safety/medical concerns         Wheelchair     Assist Will patient use wheelchair at discharge?: (TBD) Type of Wheelchair: Manual    Wheelchair assist level: Set up assist, Minimal Assistance -  Patient > 75% Max wheelchair distance: 74ft    Wheelchair 50 feet with 2 turns activity    Assist        Assist Level: Set up assist, Minimal Assistance - Patient > 75%   Wheelchair 150 feet activity     Assist  Wheelchair 150 feet activity did not occur: Safety/medical concerns       Blood pressure 95/60, pulse 63, temperature 98.2 F (36.8 C), resp. rate 16, height 5\' 3"  (1.6 m), weight 87.9 kg, SpO2 97 %.  Medical Problem List and Plan: 1.Left hemiparesis and functional deficitssecondary to right MCA infarct after right ICA,MCA, ACA occlusion. Pt s/p reperfusion/stenting---embolic pattern of stroke  -ELOS 12/17              -left PRAFO/RWHO at HS 2. Antithrombotics: -DVT/anticoagulation:Pharmaceutical:Lovenoxadded 11/30. Will order dopplers for work up.  -antiplatelet therapy: On ASA/Brillita. 3.Chronic thoracic/ shoulder pain/Pain Management:On hydrocodone rn. 4. Mood:LCSW to follow for evaluation and support. Will order sleep wake chart. -antipsychotic agents: N/A 5. Neuropsych: This patientiscapable of making decisions on herown behalf. 6. Skin/Wound Care:Routine pressure relief measures. 7. Fluids/Electrolytes/Nutrition:Monitor I/O--check lytes in am.  8. R-ICAthrombus s/pstent: On Brillinta and ASA. Monitor CBC serially. Avoid hypotension. 9. Leucocytosis: Trending down with WBC 20-->10.8on 11/30. 10. Hypomagnesemia:Recheck levels in am. Likely needs multiple vitamins due to history of gastric bypass. 11. T2DM: Hgb A1C- 9.0. Was on lantus 40 units with meal coverage and invokana PTA.  CBG (last 3)  Recent Labs    03/11/19 1127 03/11/19 1622 03/11/19 2132  GLUCAP 134* 144* 99  on Lantus 15U and Invokana controlled  12. Left thyroid nodule/Pulmonary nodules: Per CT neck 03/02/2019. Non-emergent thyroid ultrasound for follow up recommended.  13. COPD: On breo daily with albuterol prn.   14. Anxiety/depression: On Desvenlafaxine and clonazepam PTA?Now on Cymbalta 60 mg/day with Seroquel 12.5 mg am/25 mg HS---will discontinue Seroquel as has not been taking it consistently and continues to report issues with insomnia. Will resume Klonopin prn as at home andobserve. Keep sleep chart 13. Bradycardia: HR's has been down to high 40's in the past 24 hours. Asymptomatic? Will monitor for now.  14. Recurrent hypotension:Received 500 cc IVF bolus this am. Monitor BP tid for now. Vitals:   03/11/19 1925 03/12/19 0457  BP: 106/71 95/60  Pulse: 60 63  Resp: 18 16  Temp: 98.1 F (36.7 C) 98.2 F (36.8 C)  SpO2: 97% 97%  am BPs running lower but in nl range  15. Left breast cancer s/p bilateral mastectomy:  16.  Dysphagia related to CVA, monitor asp PNA, cont SLP 17.  Chronic constipation will add senna and prn dulc supp  LOS: 3 days A FACE TO FACE EVALUATION WAS PERFORMED  Charlett Blake 03/12/2019, 5:53 AM

## 2019-03-12 NOTE — IPOC Note (Signed)
Overall Plan of Care [facility]) Patient Details Name: Ann Lopez MRN: [id number] DOB: June 12, 1970  Admitting Diagnosis: <principal problem not specified>  Hospital Problems: Active Problems:   Acute ischemic right middle cerebral artery (MCA) stroke (HCC)   Left hemiparesis (HCC)   Bradycardia   Major depressive disorder, recurrent episode, moderate (HCC)   MCI (mild cognitive impairment) with memory loss     Functional Problem List: Nursing Pain, Medication Management, Nutrition, Safety, Endurance  PT Balance, Perception, Behavior, Safety, Edema, Sensory, Endurance, Motor, Pain  OT Balance, Motor, Perception, Safety, Cognition  SLP Cognition, Nutrition  TR         Basic ADL's: OT Eating, Grooming, Bathing, Dressing, Toileting     Advanced  ADL's: OT       Transfers: PT Bed Mobility, Bed to Chair, Car, Manufacturing systems engineer, Metallurgist: PT Ambulation, Emergency planning/management officer, Stairs     Additional Impairments: OT Fuctional Use of Upper Extremity  SLP Swallowing, Social Cognition   Problem Solving, Memory, Attention, Awareness  TR      Anticipated Outcomes Item Anticipated Outcome  Self Feeding Modified independent  Swallowing  Mod I   Basic self-care  min guard  Toileting  min guard   Bathroom Transfers min guard  Bowel/Bladder  Min assist  Transfers  CGA transfers  Locomotion  min assist ambulation using LRAD  Communication     Cognition  Supervision-Min A  Pain  <3 on a 0-10 pain scale  Safety/Judgment  mod assist for safety and transfers   Therapy Plan: PT Intensity: Minimum of 1-2 x/day ,45 to 90 minutes PT Frequency: 5 out of 7 days PT Duration Estimated Length of Stay: ~ 3 weeks OT Intensity: Minimum of 1-2 x/day, 45 to 90 minutes OT Frequency: 5 out of 7 days OT Duration/Estimated Length of Stay: 18-21 days SLP Intensity: Minumum of 1-2 x/day, 30 to 90 minutes SLP Frequency: 3 to 5 out of 7 days SLP Duration/Estimated  Length of Stay: ~3 weeks   Due to the current state of emergency, patients may not be receiving their 3-hours of Medicare-mandated therapy.   Team Interventions: Nursing Interventions Patient/Family Education, Disease Management/Prevention, Discharge Planning, Pain Management, Medication Management  PT interventions Ambulation/gait training, Community reintegration, DME/adaptive equipment instruction, Neuromuscular re-education, Psychosocial support, Stair training, UE/LE Strength taining/ROM, Wheelchair propulsion/positioning, Training and development officer, Discharge planning, Functional electrical stimulation, Pain management, Skin care/wound management, Therapeutic Activities, UE/LE Coordination activities, Cognitive remediation/compensation, Disease management/prevention, Functional mobility training, Patient/family education, Splinting/orthotics, Visual/perceptual remediation/compensation, Therapeutic Exercise  OT Interventions Balance/vestibular training, Discharge planning, Functional electrical stimulation, Pain management, Self Care/advanced ADL retraining, Therapeutic Activities, UE/LE Coordination activities, Cognitive remediation/compensation, Functional mobility training, Patient/family education, Therapeutic Exercise, Visual/perceptual remediation/compensation, Community reintegration, DME/adaptive equipment instruction, Neuromuscular re-education, Splinting/orthotics, UE/LE Strength taining/ROM, Wheelchair propulsion/positioning  SLP Interventions Cognitive remediation/compensation, English as a second language teacher, Dysphagia/aspiration precaution training, Functional tasks, Patient/family education, Internal/external aids, Environmental controls  TR Interventions    SW/CM Interventions Discharge Planning, Psychosocial Support, Patient/Family Education   Barriers to Discharge MD  Medical stability and anxiety   Nursing Inaccessible home environment, Decreased caregiver support, Medical stability,  Medication compliance, Lack of/limited family support    PT Inaccessible home environment, Home environment Child psychotherapist    OT      SLP      SW       Team Discharge Planning: Destination: PT-Home ,OT- Home , SLP-  Projected Follow-up: PT-Home health PT, 24 hour supervision/assistance, OT-  Home health OT, 24 hour supervision/assistance, SLP-  Projected  Equipment Needs: PT-To be determined, OT- To be determined, SLP-  Equipment Details: PT- , OT-  Patient/family involved in discharge planning: PT- Patient,  OT-Patient, Family member/caregiver, SLP-   MD ELOS: 21-25d Medical Rehab Prognosis:  Excellent Assessment:  48 year old female with history of T2DM, HTN, gastricsleeve, breast cancer s/p bilateral mastectomywith TAH/BSOand post chemo memory deficits, tobacco use, migraines, back pain who was admitted on 03/02/19 after fall from the commode and found to have left sided weakness with left neglect and right gaze preference. UDS positive with opioids and amphetamines. CTA/P head revealed near occlusive thrombus within R-ICA terminus extending into M1 R-MCA with A1 R-ACA with core infarct in R-MCA and large penumbra as well as incidental finding of left thyroid nodule. She was taken for cerebral angio with emergent thrombectomy and complete revascularization of occluded R- ICA terminus, R-MCA and R-ACA with stent assisted angioplasty of symptomatic proximal R-ICA stenosis by DR. Deveshwar. Post procedure with hypotension felt to be due to carotid manipulation as well as decrease in LOC with increase in LLE tone. EEG showed diffuse encephalopathy and repeat CT head showed some mass effect on right lateral ventricle and was negative for hemorrhage.   She tolerated extubation without difficulty but had bouts of agitation alternating with lethargy. 2D echo showed EF 60-65% with no LVH and no wall abnormality. Most recent CT head 11/25 showed residual hyperdensity within infarcted right  lentiform nucleus c/w resolving confluent petechial hemorrhage and persistent partial effacement of right lateral ventricle. Dr. Erlinda Hong felt that stroke embolic due to large vessel stenosis but PAF not fully ruled out--30 day cardiac event monitor recommended on outpatient basis. Neurology recommending SBP goal 120-140   Now requiring 24/7 Rehab RN,MD, as well as CIR level PT, OT and SLP.  Treatment team will focus on ADLs and mobility with goals set at [place] See Team Conference Notes for weekly updates to the plan of care

## 2019-03-12 NOTE — Progress Notes (Signed)
Occupational Therapy Session Note  Patient Details  Name: Ann Lopez MRN: [id number] Date of Birth: 28-Jun-1970  Today's Date: 03/12/2019 OT Individual Time: [id number] OT Individual Time Calculation (min): 30 min    Short Term Goals: Week 1:  OT Short Term Goal 1 (Week 1): Pt will complete UB dressing with min assist. OT Short Term Goal 2 (Week 1): Pt will complete LB bathing sit to stand with min assist. OT Short Term Goal 3 (Week 1): Pt will complete LB dressing sit to stand. OT Short Term Goal 4 (Week 1): Pt will complete toilet transfers with mod assist stand pivot. Week 2:     Skilled Therapeutic Interventions/Progress Updates:    NMR with focus on initiation  of movement in left UE and promotion of normal patterns of movement. Addressed left UE in sidelying and in supine position to work on shoulder retraction and protraction;  trace to minimal initiation. Pt able to also inconsistently initiate elbow flexion and then relaxation for extension towards the end of work with UE positioned slighted abducted against gravity and with UE supported at shoulder 90 degree flexion (while laying in supine). No distal movement detected in wrist or hand.   Therapy Documentation Precautions:  Precautions Precautions: Fall Other Brace: L ankle wrapped with ACE for df assist Restrictions Weight Bearing Restrictions: No General:   Vital Signs: Therapy Vitals Temp: 98.8 F (37.1 C) Pulse Rate: 68 Resp: 18 BP: (!) 96/59 Patient Position (if appropriate): Lying Oxygen Therapy SpO2: 96 % O2 Device: Room Air Pain:  no c/o pain    Therapy/Group: Individual Therapy  Willeen Cass [facility] 03/12/2019, 3:44 PM

## 2019-03-12 NOTE — Progress Notes (Signed)
Occupational Therapy Session Note  Patient Details  Name: Ann Lopez MRN: [id number] Date of Birth: 27-Jul-1970  Today's Date: 03/12/2019 OT Individual Time: 1100-1208 OT Individual Time Calculation (min): 68 min    Short Term Goals: Week 1:  OT Short Term Goal 1 (Week 1): Pt will complete UB dressing with min assist. OT Short Term Goal 2 (Week 1): Pt will complete LB bathing sit to stand with min assist. OT Short Term Goal 3 (Week 1): Pt will complete LB dressing sit to stand. OT Short Term Goal 4 (Week 1): Pt will complete toilet transfers with mod assist stand pivot.  Skilled Therapeutic Interventions/Progress Updates:    Pt completed transfer from supine to sit EOB with mod assist.  She was able to complete stand pivot transfer to the wheelchair with mod assist and to the tub bench.  She needed min assist for UB bathing and mod assist for LB bathing,  Max hand over hand for integration of the LUE into bathing task.  She was able to complete transfer to the wheelchair with mod assist.  She completed UB dressing following hemi techniques with min assist and LB dressing with overall mod assist.  She needs min assist to cross the LLE over the right knee and maintain for all dressing.  Finished session with mod assist stand pivot transfer to the recliner to finish session.  Pt left with LUE supported and call button and phone in reach.  Safety belt in place as well and NT present at end of session.    Therapy Documentation Precautions:  Precautions Precautions: Fall Other Brace: L ankle wrapped with ACE for df assist Restrictions Weight Bearing Restrictions: No   Pain: Pain Assessment Pain Scale: Faces Pain Score: 0-No pain ADL: See Care Tool Section for some details of mobility and selfcare  Therapy/Group: Individual Therapy  Otniel Hoe OTR/L 03/12/2019, 12:49 PM

## 2019-03-12 NOTE — Progress Notes (Signed)
Physical Therapy Session Note  Patient Details  Name: Ann Lopez MRN: [id number] Date of Birth: Dec 27, 1970  Today's Date: 03/12/2019 PT Individual Time: 1337-1450 PT Individual Time Calculation (min): 73 min   Short Term Goals: Week 1:  PT Short Term Goal 1 (Week 1): Pt will perform supine<>sit with min assist PT Short Term Goal 2 (Week 1): Pt will perform stand pivot transfers using LRAD with min assist PT Short Term Goal 3 (Week 1): Pt will ambulate at least 14ft using LRAD with mod assist of 1 and +2 w/c follow  Skilled Therapeutic Interventions/Progress Updates:   Pt asleep in recliner.  Tactile touch and calling name awakened her slowly.  She stated that her back hurt 5/10, but she decline meds as they put her to sleep.  Pt very sleepy throughout session, dozing and yawning.   PT added a back cushion to pt's w/c for postural control and comfort; she stated that it felt more comfortable.   Sit> stand from recliner with min assist.  Mod assist for stand pivot to L to w/c.    neuromuscular re-education via demo, forced use, wt bearing and multimodal cues for : --pelvic dissociation via reciprocal scooting forward/backward.   --Use of Kinetron in sitting in w/c for alternating reciprocal movement bil LEs, x 3 minutes at 50 cm/sec with bil hands on L knee for increased L attention, unilaterally LLE x 2 minutes, for activation with PT pushing other foot plate down, at 50 cm/sec.  L lateral leans in sitting EOM  Therapeutic activity in sitting promoting trunk rotation, wt shifting L><R, use of LUE and L visual attention; matching playing cards to board in front of her/ to L, using R hand, with 1 LOB L as she rotated L.  Using L hand with hand over hand assist.  Pt accurate with 9/9 cards.  Pt exhausted.  Stand pivot transfer w/c to bed, to R, mod assist.  Sit> supine with min assist.  At end of session, alarm set and needs at hand.  PT strongly suggested that pt get OOB for dinner, and  try to stay awake later in the evening.        Therapy Documentation Precautions:  Precautions Precautions: Fall Other Brace: L ankle wrapped with ACE for df assist Restrictions Weight Bearing Restrictions: No        Therapy/Group: Individual Therapy  Ndia Sampath 03/12/2019, 3:26 PM

## 2019-03-12 NOTE — Progress Notes (Signed)
Speech Language Pathology Daily Session Note  Patient Details  Name: Ann Lopez MRN: [id number] Date of Birth: [birth year]  Today's Date: 03/12/2019 SLP Individual Time: 0830-0900 SLP Individual Time Calculation (min): 30 min  Short Term Goals: Week 1: SLP Short Term Goal 1 (Week 1): Pt will consume Dysphagia 3/thin liquid diet with min overt s/sx aspiration and efficient mastication and oral clearance with no more than Min A cues SLP Short Term Goal 2 (Week 1): Pt will attend to left visual field of environment during functional tasks with Mod A verbal cues SLP Short Term Goal 3 (Week 1): Pt will demonstrate ability to problem solve during basic and mildly complex tasks with Min A verbal/visual cues SLP Short Term Goal 4 (Week 1): Pt will demonstrate sustained attention to functional tasks for 5 minute intervals with Mod A verbal cues for redirection SLP Short Term Goal 5 (Week 1): Pt will identify 1 physical and 1 cognitive deficits with Mod A verbal cues SLP Short Term Goal 6 (Week 1): Pt will demonstrate functional recall of daily information with Mod A for use of compensatory memory aids/strategies  Skilled Therapeutic Interventions:  Skilled treatment session focused on cognition goals. SLP recieved pt after she had already consumed breakfast. SLP offered graham crackers for trial of regular but pt is tired of graham crackers. SLP and pt created plan to continue session this afternoon with trial tray of regular lunch. Pt requested fresh fruit. SLP entered order and provided Mod I cues for pt to call and place order for lunch. Pt was Mod I when recalling her current medications, purpose of medication and frequency of medication. Pt also recognized and recalled this writer from services provided on acute.  SLP positioned herself to pt's left and pt was appropriate with eye contact and scanning to her left to get items (menu, room phone) that SLP handed to pt.       Pain     Therapy/Group: Individual Therapy  Willamae Demby 03/12/2019, 10:36 AM

## 2019-03-13 ENCOUNTER — Inpatient Hospital Stay (HOSPITAL_COMMUNITY): Payer: Medicare Other | Admitting: Physical Therapy

## 2019-03-13 ENCOUNTER — Inpatient Hospital Stay (HOSPITAL_COMMUNITY): Payer: Medicare Other | Admitting: Speech Pathology

## 2019-03-13 ENCOUNTER — Inpatient Hospital Stay (HOSPITAL_COMMUNITY): Payer: Medicare Other | Admitting: Occupational Therapy

## 2019-03-13 LAB — GLUCOSE, CAPILLARY
Glucose-Capillary: 88 mg/dL (ref 70–99)
Glucose-Capillary: 114 mg/dL — ABNORMAL HIGH (ref 70–99)
Glucose-Capillary: 99 mg/dL (ref 70–99)
Glucose-Capillary: 99 mg/dL (ref 70–99)

## 2019-03-13 MED ORDER — TOPIRAMATE 25 MG PO TABS
25.0000 mg | ORAL_TABLET | Freq: Two times a day (BID) | ORAL | Status: DC
  Administered 2019-03-13 – 2019-03-18 (×12): 25 mg via ORAL
  Filled 2019-03-13 (×13): qty 1

## 2019-03-13 NOTE — Progress Notes (Signed)
Physical Therapy Session Note  Patient Details  Name: Ann Lopez MRN: [id number] Date of Birth: 08/31/1970  Today's Date: 03/13/2019 PT Individual Time: 0805-0900 AND 1700-1740 PT Individual Time Calculation (min): 55 min and 40 min   Short Term Goals: Week 1:  PT Short Term Goal 1 (Week 1): Pt will perform supine<>sit with min assist PT Short Term Goal 2 (Week 1): Pt will perform stand pivot transfers using LRAD with min assist PT Short Term Goal 3 (Week 1): Pt will ambulate at least 62f using LRAD with mod assist of 1 and +2 w/c follow  Skilled Therapeutic Interventions/Progress Updates:  Session 1  Pt received supine in bed and agreeable to PT. Supine>sit transfer with min assist for LLE control and assist for trunk management. Sitting balance EOB x 5 min for PT to don Bil shoes. Squat pivot transfer to the R with min assist from PT to block the LLE.   Pt transported to rehab gym. Pt very tearful about current situation and having to be in the hospital on her birthday as well as having limited access to her children while in rehab. PT provided emotional support and therapeutic use of self.   Sit<>[facility] x 4 and LUE in hand orthotic.   Lateral reach in standing to the R with min-mod assist to improve awareness of the LLE and prevent knee collapse and sustain terminal knee extension.   Seated NMR for the LLE: ankle extension/flexion AAROM, ankle DF with mostly passive ROM and max cues for AROM, hip/knee flexion/extension in synergy with added assist for flexion vs extension.   WC mobility with hemi techniqwe x 753fwith min assist due to poor coorination of the UE and LE on the L.   Patient returned to room and left [facility] in [facility] in reach and all needs met.  Session 2.  Pt received supine in bed and agreeable to PT. Supine>sit transfer with min assist at trunk and LLE. Sitting balance EOB for PT to don shoes with supervision. Squat pivot  [facility] assist for set up and safety. WC mobility over level and unlevel surfaces x 1001fach with min assist to maintain straigh path and cues to improve coordination of RUE and RLE. Pt allowed to take prolonged rest break outside. Pt returned to room and performed squat pivot transfer to bed with min assist blocking the LLE. Sit>supine completed with min assist for LLE control, and left supine in bed with call bell in reach and all needs met.           Therapy Documentation Precautions:  Precautions Precautions: Fall Other Brace: L ankle wrapped with ACE for df assist Restrictions Weight Bearing Restrictions: No Vital Signs: Therapy Vitals Temp: 98.3 F (36.8 C) Pulse Rate: 72 Resp: 18 BP: 106/66 Patient Position (if appropriate): Lying Oxygen Therapy SpO2: 98 % O2 Device: Room Air Pain:   denies  Therapy/Group: Individual Therapy  AusLorie Phenix/07/2018, 9:05 AM

## 2019-03-13 NOTE — Progress Notes (Signed)
PHYSICAL MEDICINE & REHABILITATION PROGRESS NOTE   Subjective/Complaints:  Large BM yesterday  Crying an depressed , it is her birthday and she is in hospital with stroke   ROS:Neg CP, SOB, N/V/D   Objective:   No results found. No results for input(s): WBC, HGB, HCT, PLT in the last 72 hours. No results for input(s): NA, K, CL, CO2, GLUCOSE, BUN, CREATININE, CALCIUM in the last 72 hours.  Intake/Output Summary (Last 24 hours) at 03/13/2019 0746 Last data filed at 03/12/2019 1811 Gross per 24 hour  Intake 180 ml  Output -  Net 180 ml     Physical Exam: Vital Signs Blood pressure 106/66, pulse 72, temperature 98.3 F (36.8 C), resp. rate 18, height 5\' 3"  (1.6 m), weight 87.9 kg, SpO2 98 %.   General: No acute distress Mood and affect are appropriate Heart: Regular rate and rhythm no rubs murmurs or extra sounds Lungs: Clear to auscultation, breathing unlabored, no rales or wheezes Abdomen: Positive bowel sounds, soft nontender to palpation, nondistended Extremities: No clubbing, cyanosis, or edema Skin: No evidence of breakdown, no evidence of rash Neurologic: Cranial nerves II through XII intact, motor strength is 5/5 in right  deltoid, bicep, tricep, grip, hip flexor, knee extensors, ankle dorsiflexor and plantar flexor 0/5 in LUE and 3- LLE  Sensory exam normal sensation to light touch and proprioception in bilateral upper and lower extremities Cerebellar exam normal finger to nose to finger as well as heel to shin in bilateral upper and lower extremities Musculoskeletal: Full range of motion in all 4 extremities. No joint swelling   Assessment/Plan: 1. Functional deficits secondary to Right MCA infarct which require 3+ hours per day of interdisciplinary therapy in a comprehensive inpatient rehab setting.  Physiatrist is providing close team supervision and 24 hour management of active medical problems listed below.  Physiatrist and rehab team continue to  assess barriers to discharge/monitor patient progress toward functional and medical goals  Care Tool:  Bathing    Body parts bathed by patient: Left arm, Chest, Abdomen, Right upper leg, Left upper leg, Right lower leg, Face, Front perineal area   Body parts bathed by helper: Right arm, Buttocks, Left lower leg     Bathing assist Assist Level: Moderate Assistance - Patient 50 - 74%     Upper Body Dressing/Undressing Upper body dressing   What is the patient wearing?: Pull over shirt    Upper body assist Assist Level: Minimal Assistance - Patient > 75%    Lower Body Dressing/Undressing Lower body dressing      What is the patient wearing?: Pants, Underwear/pull up     Lower body assist Assist for lower body dressing: Moderate Assistance - Patient 50 - 74%     Toileting Toileting    Toileting assist Assist for toileting: Maximal Assistance - Patient 25 - 49%     Transfers Chair/bed transfer  Transfers assist     Chair/bed transfer assist level: Moderate Assistance - Patient 50 - 74%     Locomotion Ambulation   Ambulation assist   Ambulation activity did not occur: Safety/medical concerns  Assist level: 2 helpers Assistive device: Other (comment)(hallway rail) Max distance: 85ft   Walk 10 feet activity   Assist     Assist level: 2 helpers Assistive device: Other (comment)(hallway rail)   Walk 50 feet activity   Assist Walk 50 feet with 2 turns activity did not occur: Safety/medical concerns         Walk 150  feet activity   Assist Walk 150 feet activity did not occur: Safety/medical concerns         Walk 10 feet on uneven surface  activity   Assist Walk 10 feet on uneven surfaces activity did not occur: Safety/medical concerns         Wheelchair     Assist Will patient use wheelchair at discharge?: (TBD) Type of Wheelchair: Manual    Wheelchair assist level: Set up assist, Minimal Assistance - Patient > 75% Max  wheelchair distance: 56ft    Wheelchair 50 feet with 2 turns activity    Assist        Assist Level: Set up assist, Minimal Assistance - Patient > 75%   Wheelchair 150 feet activity     Assist  Wheelchair 150 feet activity did not occur: Safety/medical concerns       Blood pressure 106/66, pulse 72, temperature 98.3 F (36.8 C), resp. rate 18, height 5\' 3"  (1.6 m), weight 87.9 kg, SpO2 98 %.  Medical Problem List and Plan: 1.Left hemiparesis and functional deficitssecondary to right MCA infarct after right ICA,MCA, ACA occlusion. Pt s/p reperfusion/stenting---embolic pattern of stroke  -ELOS 12/17              -left PRAFO/RWHO at HS 2. Antithrombotics: -DVT/anticoagulation:Pharmaceutical:Lovenoxadded 11/30. Will order dopplers for work up.  -antiplatelet therapy: On ASA/Brillita. 3.Chronic thoracic/ shoulder pain/Pain Management:On hydrocodone at home (went to pain clinic) Also with HA, add topiramate  4. Mood:LCSW to follow for evaluation and support. Will order sleep wake chart. -antipsychotic agents: N/A Hx of depression and anxiety - appreciate neuropsych, on Cymbalta and Klonopin 5. Neuropsych: This patientiscapable of making decisions on herown behalf. 6. Skin/Wound Care:Routine pressure relief measures. 7. Fluids/Electrolytes/Nutrition:Monitor I/O--check lytes in am.  8. R-ICAthrombus s/pstent: On Brillinta and ASA. Monitor CBC serially. Avoid hypotension. 9. Leucocytosis: Trending down with WBC 20-->10.8on 11/30. 10. Hypomagnesemia:Recheck levels in am. Likely needs multiple vitamins due to history of gastric bypass. 11. T2DM: Hgb A1C- 9.0. Was on lantus 40 units with meal coverage and invokana PTA.  CBG (last 3)  Recent Labs    03/12/19 1646 03/12/19 2057 03/13/19 0607  GLUCAP 92 97 88  on Lantus 15U and Invokana controlled  12. Left thyroid nodule/Pulmonary nodules: Per CT neck  03/02/2019. Non-emergent thyroid ultrasound for follow up recommended.  13. COPD: On breo daily with albuterol prn.  14. Anxiety/depression: On Desvenlafaxine and clonazepam PTA?Now on Cymbalta 60 mg/day with Seroquel 12.5 mg am/25 mg HS---will discontinue Seroquel as has not been taking it consistently and continues to report issues with insomnia. Will resume Klonopin prn as at home andobserve. Keep sleep chart 13. Bradycardia: HR's has been down to high 40's in the past 24 hours. Asymptomatic? Will monitor for now.  14. Recurrent hypotension:Received 500 cc IVF bolus this am. Monitor BP tid for now. Vitals:   03/12/19 1953 03/13/19 0516  BP: 106/74 106/66  Pulse: (!) 59 72  Resp: 17 18  Temp: 98.1 F (36.7 C) 98.3 F (36.8 C)  SpO2: 96% 98%  controlled 12/4 15. Left breast cancer s/p bilateral mastectomy:  16.  Dysphagia related to CVA, monitor asp PNA, cont SLP 17.  Chronic constipation will add senna and prn dulc supp  LOS: 4 days A FACE TO FACE EVALUATION WAS PERFORMED  Charlett Blake 03/13/2019, 7:46 AM

## 2019-03-13 NOTE — Progress Notes (Signed)
Occupational Therapy Session Note  Patient Details  Name: Ann Lopez MRN: [id number] Date of Birth: October 09, 1970  Today's Date: 03/13/2019 OT Individual Time: 1003-1105 OT Individual Time Calculation (min): 62 min    Short Term Goals: Week 1:  OT Short Term Goal 1 (Week 1): Pt will complete UB dressing with min assist. OT Short Term Goal 2 (Week 1): Pt will complete LB bathing sit to stand with min assist. OT Short Term Goal 3 (Week 1): Pt will complete LB dressing sit to stand. OT Short Term Goal 4 (Week 1): Pt will complete toilet transfers with mod assist stand pivot.  Skilled Therapeutic Interventions/Progress Updates:    Pt completed transfer from the wheelchair to the therapy mat with mod assist stand pivot.  Had pt work on LUE weightbearing with functional reaching across her body with the RUE while supporting herself with the LUE and mod facilitation.  Transitioned on working on sit to squat while placing clothes pins with the RUE and the LUE in weightbearing.  Mod assist needed to maintain balance during functional reaching task.  Had pt also work on pushing a tilted stool forward with the LUE to a target with min facilitation at the shoulder and mod facilitation for upright posture and to avoid trunk compensations forward and back with the movement.  Therapist was able to donn NMES using the Saebo Go stim at end of session for stimulation of left subluxation.  She was able to tolerate stimulation for 60 mins without any adverse reactions.  See settings below   Saebo Stim Go 330 pulse width 35 Hz pulse rate On 8 sec/ off 8 sec Ramp up/ down 2 sec Symmetrical Biphasic wave form  Max intensity 165mA at 500 Ohm load   Therapy Documentation Precautions:  Precautions Precautions: Fall Other Brace: L ankle wrapped with ACE for df assist Restrictions Weight Bearing Restrictions: No   Pain: Pain Assessment Pain Scale: 0-10 Pain Score: 0-No pain ADL: See Care Tool Section  for some details of mobility and selfcare Therapy/Group: Individual Therapy  Lowana Hable OTR/L 03/13/2019, 12:41 PM

## 2019-03-13 NOTE — Progress Notes (Signed)
Speech Language Pathology Discharge Summary  Patient Details  Name: Ann Lopez MRN: [id number] Date of Birth: [birth year]  Today's Date: 03/13/2019 SLP Individual Time: 1300-1345 SLP Individual Time Calculation (min): 45 min   Skilled Therapeutic Interventions:  Skilled treatment session focused on semi-complex to complex reasoning task of pt paying her cell phone bill. Pt able to explain difficulty involved in attempting to pay her bill, she was able to problem solve and fix the barriers. This involved pt contacting her son via text, obtaining card number then pt able to predict need for expiration date and CVC code. Pt was independent with then paying [employer] bill. SLP further facilitated session by providing hypothetical situations to target anticipatory awareness. Pt provided appropriate solutions and demonstrated good safety awareness and problem solving. Over the course of skilled ST treatments pt has demonstrated increased cognitive abilities and currently appears at functional baseline and is able to complete all complex tasks that she participates in within her daily life.      Patient has met 5 of 5 long term goals.  Patient to discharge at overall Modified Independent level.  Reasons goals not met:   N/A  Clinical Impression/Discharge Summary:   Pt has made improvements over the course of skilled ST sessions and as such is able to complete complex task with independence. She has surpassed the LTGs that were set and is in fact independent with cognitive tasks. No further ST services are indicated with no follow up services.   Care Partner: TBD    Type of Caregiver Assistance: Physical  Recommendation:  24 hour supervision/assistance;Other (comment)(No further ST services indicated)      Equipment:   N/A  Reasons for discharge: Treatment goals met   Patient/Family Agrees with Progress Made and Goals Achieved: Yes    Lew Prout 03/13/2019, 1:52 PM

## 2019-03-14 ENCOUNTER — Inpatient Hospital Stay (HOSPITAL_COMMUNITY): Payer: Medicare Other

## 2019-03-14 LAB — GLUCOSE, CAPILLARY
Glucose-Capillary: 90 mg/dL (ref 70–99)
Glucose-Capillary: 83 mg/dL (ref 70–99)
Glucose-Capillary: 141 mg/dL — ABNORMAL HIGH (ref 70–99)
Glucose-Capillary: 92 mg/dL (ref 70–99)

## 2019-03-14 NOTE — Progress Notes (Signed)
Occupational Therapy Session Note  Patient Details  Name: Ann Lopez MRN: [id number] Date of Birth: Oct 25, 1970  Today's Date: 03/14/2019 OT Individual Time: 1000-1100 OT Individual Time Calculation (min): 60 min    Short Term Goals: Week 1:  OT Short Term Goal 1 (Week 1): Pt will complete UB dressing with min assist. OT Short Term Goal 2 (Week 1): Pt will complete LB bathing sit to stand with min assist. OT Short Term Goal 3 (Week 1): Pt will complete LB dressing sit to stand. OT Short Term Goal 4 (Week 1): Pt will complete toilet transfers with mod assist stand pivot.  Skilled Therapeutic Interventions/Progress Updates:    1:1. Pt received with NTs present needing to use bathroom. tp sit to stand in stedy with A to maintain L foot from rolling upon standing to transfer onto toilet and TTB in shower. Pt requires MIN A for bathing at seated level leaning lateraly to wash buttocks with CGA for sitting balance, A to maintain LLE in figure 4 to wash foot and HOH A to wash RUE with LUE. Pt grooms at sink seated with set up locating all items on L. Provided pt with HOH A to apply deodorant and shave underarms with electric razor. Pt completes UB dress with VC for hemi technqiue. Pt requires A to pull pants/underwear past hips after cuing for threading LLE first. Pt dons B socks with 1 handed technique, but A provided to maintain figure 4. Pt reporting fatigue and dizziness. Pt returned to supine and vitals assessed and WNL. Pt reporting dizziness going away and just being exhausted. RN aware. Exited session with pt HOB elevated in bed, exit alarm on and call light in reach  Therapy Documentation Precautions:  Precautions Precautions: Fall Other Brace: L ankle wrapped with ACE for df assist Restrictions Weight Bearing Restrictions: No General:   Vital Signs:   Pain: Pain Assessment Pain Score: Asleep ADL: ADL Eating: Supervision/safety Where Assessed-Eating: Wheelchair Grooming:  Minimal assistance Where Assessed-Grooming: Wheelchair Upper Body Bathing: Minimal assistance Where Assessed-Upper Body Bathing: Wheelchair Lower Body Bathing: Moderate assistance Where Assessed-Lower Body Bathing: Wheelchair Upper Body Dressing: Moderate assistance Where Assessed-Upper Body Dressing: Wheelchair Lower Body Dressing: Maximal assistance Where Assessed-Lower Body Dressing: Wheelchair Toileting: Moderate assistance Where Assessed-Toileting: Bedside Commode Toilet Transfer: Maximal assistance Toilet Transfer Method: Stand pivot Toilet Transfer Equipment: Bedside commode Vision   Perception    Praxis   Exercises:   Other Treatments:     Therapy/Group: Individual Therapy  Tonny Branch 03/14/2019, 10:32 AM

## 2019-03-14 NOTE — Progress Notes (Signed)
[facility] PHYSICAL MEDICINE & REHABILITATION PROGRESS NOTE   Subjective/Complaints:  PT made my day yesterday.  Was able to meet family outside during PT session   ROS:Neg CP, SOB, N/V/D   Objective:   No results found. No results for input(s): WBC, HGB, HCT, PLT in the last 72 hours. No results for input(s): NA, K, CL, CO2, GLUCOSE, BUN, CREATININE, CALCIUM in the last 72 hours.  Intake/Output Summary (Last 24 hours) at 03/14/2019 0905 Last data filed at 03/13/2019 1253 Gross per 24 hour  Intake 60 ml  Output -  Net 60 ml     Physical Exam: Vital Signs Blood pressure 127/75, pulse 66, temperature 98.3 F (36.8 C), resp. rate 19, height 5\' 3"  (1.6 m), weight 86.5 kg, SpO2 100 %.   General: No acute distress Mood and affect are appropriate Heart: Regular rate and rhythm no rubs murmurs or extra sounds Lungs: Clear to auscultation, breathing unlabored, no rales or wheezes Abdomen: Positive bowel sounds, soft nontender to palpation, nondistended Extremities: No clubbing, cyanosis, or edema Skin: No evidence of breakdown, no evidence of rash Neurologic: Cranial nerves II through XII intact, motor strength is 5/5 in right  deltoid, bicep, tricep, grip, hip flexor, knee extensors, ankle dorsiflexor and plantar flexor 0/5 in LUE and 3- LLE - unchanged  Sensory exam normal sensation to light touch and proprioception in bilateral upper and lower extremities Cerebellar exam normal finger to nose to finger as well as heel to shin in bilateral upper and lower extremities Musculoskeletal: Full range of motion in all 4 extremities. No joint swelling   Assessment/Plan: 1. Functional deficits secondary to Right MCA infarct which require 3+ hours per day of interdisciplinary therapy in a comprehensive inpatient rehab setting.  Physiatrist is providing close team supervision and 24 hour management of active medical problems listed below.  Physiatrist and rehab team continue to assess  barriers to discharge/monitor patient progress toward functional and medical goals  Care Tool:  Bathing    Body parts bathed by patient: Left arm, Chest, Abdomen, Right upper leg, Left upper leg, Right lower leg, Face, Front perineal area   Body parts bathed by helper: Right arm, Buttocks, Left lower leg     Bathing assist Assist Level: Moderate Assistance - Patient 50 - 74%     Upper Body Dressing/Undressing Upper body dressing   What is the patient wearing?: Pull over shirt    Upper body assist Assist Level: Minimal Assistance - Patient > 75%    Lower Body Dressing/Undressing Lower body dressing      What is the patient wearing?: Pants, Underwear/pull up     Lower body assist Assist for lower body dressing: Moderate Assistance - Patient 50 - 74%     Toileting Toileting    Toileting assist Assist for toileting: Maximal Assistance - Patient 25 - 49%     Transfers Chair/bed transfer  Transfers assist     Chair/bed transfer assist level: Moderate Assistance - Patient 50 - 74%(stand pivot)     Locomotion Ambulation   Ambulation assist   Ambulation activity did not occur: Safety/medical concerns  Assist level: 2 helpers Assistive device: Other (comment)(hallway rail) Max distance: 42ft   Walk 10 feet activity   Assist     Assist level: 2 helpers Assistive device: Other (comment)(hallway rail)   Walk 50 feet activity   Assist Walk 50 feet with 2 turns activity did not occur: Safety/medical concerns         Walk 150 feet  activity   Assist Walk 150 feet activity did not occur: Safety/medical concerns         Walk 10 feet on uneven surface  activity   Assist Walk 10 feet on uneven surfaces activity did not occur: Safety/medical concerns         Wheelchair     Assist Will patient use wheelchair at discharge?: (TBD) Type of Wheelchair: Manual    Wheelchair assist level: Set up assist, Minimal Assistance - Patient > 75% Max  wheelchair distance: 46ft    Wheelchair 50 feet with 2 turns activity    Assist        Assist Level: Set up assist, Minimal Assistance - Patient > 75%   Wheelchair 150 feet activity     Assist  Wheelchair 150 feet activity did not occur: Safety/medical concerns       Blood pressure 127/75, pulse 66, temperature 98.3 F (36.8 C), resp. rate 19, height 5\' 3"  (1.6 m), weight 86.5 kg, SpO2 100 %.  Medical Problem List and Plan: 1.Left hemiparesis and functional deficitssecondary to right MCA infarct after right ICA,MCA, ACA occlusion. Pt s/p reperfusion/stenting---embolic pattern of stroke  -ELOS 12/17              -left PRAFO/RWHO at HS 2. Antithrombotics: -DVT/anticoagulation:Pharmaceutical:Lovenoxadded 11/30. Will order dopplers for work up.  -antiplatelet therapy: On ASA/Brillita. 3.Chronic thoracic/ shoulder pain/Pain Management:On hydrocodone at home (went to pain clinic) Also with HA, add topiramate  4. Mood:LCSW to follow for evaluation and support. Will order sleep wake chart. -antipsychotic agents: N/A Hx of depression and anxiety - appreciate neuropsych, on Cymbalta and Klonopin 5. Neuropsych: This patientiscapable of making decisions on herown behalf. 6. Skin/Wound Care:Routine pressure relief measures. 7. Fluids/Electrolytes/Nutrition:Monitor I/O--check lytes in am.  8. R-ICAthrombus s/pstent: On Brillinta and ASA. Monitor CBC serially. Avoid hypotension. 9. Leucocytosis: Trending down with WBC 20-->10.8on 11/30. 10. Hypomagnesemia:Recheck levels in am. Likely needs multiple vitamins due to history of gastric bypass. 11. T2DM: Hgb A1C- 9.0. Was on lantus 40 units with meal coverage and invokana PTA.  CBG (last 3)  Recent Labs    03/13/19 1649 03/13/19 2117 03/14/19 0622  GLUCAP 99 99 90  on Lantus 15U and Invokana controlled  12. Left thyroid nodule/Pulmonary nodules: Per CT neck  03/02/2019. Non-emergent thyroid ultrasound for follow up recommended.  13. COPD: On breo daily with albuterol prn.  14. Anxiety/depression: On Desvenlafaxine and clonazepam PTA?Now on Cymbalta 60 mg/day with Seroquel 12.5 mg am/25 mg HS---will discontinue Seroquel as has not been taking it consistently and continues to report issues with insomnia. Will resume Klonopin prn as at home andobserve. Keep sleep chart 13. Bradycardia: HR's has been down to high 40's in the past 24 hours. Asymptomatic? Will monitor for now.  14. Recurrent hypotension:Received 500 cc IVF bolus this am. Monitor BP tid for now. Vitals:   03/13/19 2119 03/14/19 0519  BP: 123/69 127/75  Pulse: 61 66  Resp: 19 19  Temp: 98 F (36.7 C) 98.3 F (36.8 C)  SpO2: 99% 100%  controlled 12/5 15. Left breast cancer s/p bilateral mastectomy:  16.  Dysphagia related to CVA, monitor asp PNA, cont SLP 17.  Chronic constipation will add senna and prn dulc supp  LOS: 5 days A FACE TO FACE EVALUATION WAS PERFORMED  Charlett Blake 03/14/2019, 9:05 AM

## 2019-03-15 ENCOUNTER — Inpatient Hospital Stay (HOSPITAL_COMMUNITY): Payer: Medicare Other

## 2019-03-15 LAB — GLUCOSE, CAPILLARY
Glucose-Capillary: 101 mg/dL — ABNORMAL HIGH (ref 70–99)
Glucose-Capillary: 102 mg/dL — ABNORMAL HIGH (ref 70–99)
Glucose-Capillary: 96 mg/dL (ref 70–99)
Glucose-Capillary: 140 mg/dL — ABNORMAL HIGH (ref 70–99)

## 2019-03-15 MED ORDER — TRAZODONE HCL 50 MG PO TABS
50.0000 mg | ORAL_TABLET | Freq: Every day | ORAL | Status: DC
  Administered 2019-03-15 – 2019-03-19 (×5): 50 mg via ORAL
  Filled 2019-03-15 (×5): qty 1

## 2019-03-15 MED ORDER — ZOLPIDEM TARTRATE 5 MG PO TABS: 5.0000 mg | ORAL_TABLET | Freq: Every day | ORAL | Status: DC

## 2019-03-15 NOTE — Progress Notes (Signed)
Physical Therapy Session Note  Patient Details  Name: Ann Lopez MRN: [id number] Date of Birth: 1970-12-26  Today's Date: 03/15/2019 PT Individual Time: 0805-0903 and [id number] PT Individual Time Calculation (min): 58 min and 70 min   Short Term Goals: Week 1:  PT Short Term Goal 1 (Week 1): Pt will perform supine<>sit with min assist PT Short Term Goal 2 (Week 1): Pt will perform stand pivot transfers using LRAD with min assist PT Short Term Goal 3 (Week 1): Pt will ambulate at least 74ft using LRAD with mod assist of 1 and +2 w/c follow  Skilled Therapeutic Interventions/Progress Updates:    Session 1:  Pt supine in bed upon PT arrival, agreeable to therapy tx and denies pain. Pt supine in bed while therapist assisted to loop LEs through pants, transferred to sitting EOB with min assist. Pt performed sit<>stand from EOB with mod assist, pulled pants up over hips. Pt performed squat pivot to the L with mod assist, cues for techniques. Pt transported to the gym. Pt performed squat pivot w/c>mat towards the R with min assist. Therapist donned L AFO to trial this session in order to provide better ankle positioning and provide medial-lateral stability. Pt performed x 10 sit<>stands this session without UE support, mirror for visual feedback to emphasize symmetric LE weightbearing, facilitation for inc L hip extension in standing, facilitation for increased L weightshift, therapist blocking L knee. In static standing pt worked on maintaining midline while working on L extensor and L knee extensor muscle activation with tactile and visual cues. Pt worked on timing of L hip/knee extension and stance control with x 5 lateral R steps in place, no UE support, therapist providing facilitation and blocking for L knee control. Pt transferred back to w/c with min assist squat pivot, cues for techniques and awareness. Pt ambulated x 15 ft and x 20 ft using R rail for UE support, L AFO and mod assist, pt able  to advance L LE during swing with cues/assist for placement to limit adduction, during L stance therapist facilitating increased L hip extension and controlling L knee to limit hyperextension/blocking with cues for quad activation. Pt reports feeling exhausted, education for importance of staying OOB, agreeable to try sitting in recliner. Pt transported back to room and transferred to recliner mod assist, left with needs in reach and chair alarm set.   Session 2: Pt supine in bed upon PT arrival, agreeable to therapy tx and reports pain 3/10 in R lower back. Pt reports earlier after being up in the recliner she was in a lot of pain and had increased nausea. Therapist donned shoes and ace wrap to R LE for eversion/DF positioning. Pt transferred to sitting EOB with min assist, squat pivot to w/c with min assist and transported to the gym. Pt transferred to standing with mod assist and to tall kneeling on mat with UE support on bench mod assist, in tall kneeling worked on hip extension/glute activation, worked on lateral weightshifting and worked on L lateral weightshift with L glute activation while moving R knee out/in. Pt reports feeling very fatigued. Pt transferred to sitting with min assist. Pt then reports that she had felt light headed. Pt seated edge of mat while therapist checked vitals, in sitting BP 115/89 and HR 85 bpm, in standing BP 81/67 and HR 118 - RN notified (Dauda). Pt with orthostatic hypotension and therapist provided education on why patient is getting light headed. Therapist donned thigh high teds for management  of low BP. Checked BP again in sitting- 107/85 with HR 85 bpm. Deferred further standing activities secondary to low blood pressure. Pt performed squat pivot to the w/c with min assist and transported to the dayroom. Pt became emotional this session and reports "I just want to go home, I want to see my boys. Sometimes I feel like I want to give up." Therapist providing emotional  support throughout. Pt used kinetron this session 2 x 4 min bouts on 50 cm/sec resistance level working on hip strengthening and neuro re-ed. Transported back to room and transferred to bed with min assist, sit>supine min assist. Donned PRAFO, left supine with needs in reach and bed alarm set.        Therapy Documentation Precautions:  Precautions Precautions: Fall Other Brace: L ankle wrapped with ACE for df assist Restrictions Weight Bearing Restrictions: No    Therapy/Group: Individual Therapy  Netta Corrigan, PT, DPT, CSRS 03/15/2019, 7:47 AM

## 2019-03-15 NOTE — Progress Notes (Signed)
Occupational Therapy Session Note  Patient Details  Name: Ann Lopez MRN: [id number] Date of Birth: September 30, 1970  Today's Date: 03/15/2019 OT Individual Time: 8003-4917 OT Individual Time Calculation (min): 60 min    Short Term Goals: Week 1:  OT Short Term Goal 1 (Week 1): Pt will complete UB dressing with min assist. OT Short Term Goal 2 (Week 1): Pt will complete LB bathing sit to stand with min assist. OT Short Term Goal 3 (Week 1): Pt will complete LB dressing sit to stand. OT Short Term Goal 4 (Week 1): Pt will complete toilet transfers with mod assist stand pivot.   Skilled Therapeutic Interventions/Progress Updates:    Pt received sitting up in recliner with RN getting ready to assist with toilet transfer. Pt c/o back pain ,requesting to return to bed for pain management. Pt completed sit > stand in stedy with CGA, max A for facilitation of LUE. Pt was transferred to [facility] over toilet with use of stedy. Pt voided urine. Pt required min cueing to doff shirt while seated on BSC. Pt required max facilitation to provide HOH to LUE to wash under RUE. Pt required min A to don shirt over LUE. Pt stood in stedy with CGA. She required max A for peri hygiene and clothing management, including one seated rest break in standing. Pt very fatigued and c/o nausea. BP assessed 104/63. Pt was returned to bed via stedy. She was able to follow commands to pull herself up in bed with bed in slight trend. Pt then agreeable to gravity eliminated horizontal abduction/adduction exercises. Pt with activation in her pecs, very little in rear deltoid. Pt agreeable to use of e-stim for tricep activation. Pt required mod-max facilitation to maintain LUE in extended position while supine. Pt cued for L attention throughout. Pt was positioned in sidelying to end session with multiple pillows used to faciliate proper positioning. Bed alarm set and all needs met.   1:1 NMES applied to triceps to increase muscle  activation.   Ratio 1:3 Rate 35 pps Waveform- Asymmetric Ramp 1.0 Pulse 300 Intensity- 17 Duration -   10 min  Report of pain at the beginning of session:0/10 Report of pain at the end of session: 0/10  No adverse reactions after treatment and is skin intact.   Therapy Documentation Precautions:  Precautions Precautions: Fall Other Brace: L ankle wrapped with ACE for df assist Restrictions Weight Bearing Restrictions: No   Therapy/Group: Individual Therapy  Curtis Sites 03/15/2019, 11:52 AM

## 2019-03-15 NOTE — Progress Notes (Signed)
[facility] PHYSICAL MEDICINE & REHABILITATION PROGRESS NOTE   Subjective/Complaints:  No issues overnite except poor sleep, no bowel or bladder issues or pain Had a blow out yestereay after laxatives   ROS:Neg CP, SOB, N/V/D   Objective:   No results found. No results for input(s): WBC, HGB, HCT, PLT in the last 72 hours. No results for input(s): NA, K, CL, CO2, GLUCOSE, BUN, CREATININE, CALCIUM in the last 72 hours.  Intake/Output Summary (Last 24 hours) at 03/15/2019 0957 Last data filed at 03/15/2019 0900 Gross per 24 hour  Intake 489 ml  Output -  Net 489 ml     Physical Exam: Vital Signs Blood pressure 95/76, pulse 71, temperature 98.4 F (36.9 C), resp. rate 19, height 5\' 3"  (1.6 m), weight 86.5 kg, SpO2 96 %.   General: No acute distress Mood and affect are appropriate Heart: Regular rate and rhythm no rubs murmurs or extra sounds Lungs: Clear to auscultation, breathing unlabored, no rales or wheezes Abdomen: Positive bowel sounds, soft nontender to palpation, nondistended Extremities: No clubbing, cyanosis, or edema Skin: No evidence of breakdown, no evidence of rash Neurologic: Cranial nerves II through XII intact, motor strength is 5/5 in right  deltoid, bicep, tricep, grip, hip flexor, knee extensors, ankle dorsiflexor and plantar flexor 0/5 in LUE and 3- LLE - unchanged  Sensory exam normal sensation to light touch and proprioception in bilateral upper and lower extremities Cerebellar exam normal finger to nose to finger as well as heel to shin in bilateral upper and lower extremities Musculoskeletal: Full range of motion in all 4 extremities. No joint swelling   Assessment/Plan: 1. Functional deficits secondary to Right MCA infarct which require 3+ hours per day of interdisciplinary therapy in a comprehensive inpatient rehab setting.  Physiatrist is providing close team supervision and 24 hour management of active medical problems listed  below.  Physiatrist and rehab team continue to assess barriers to discharge/monitor patient progress toward functional and medical goals  Care Tool:  Bathing    Body parts bathed by patient: Left arm, Chest, Abdomen, Right upper leg, Left upper leg, Right lower leg, Face, Front perineal area, Buttocks   Body parts bathed by helper: Left lower leg, Right arm     Bathing assist Assist Level: Minimal Assistance - Patient > 75%     Upper Body Dressing/Undressing Upper body dressing   What is the patient wearing?: Pull over shirt    Upper body assist Assist Level: Supervision/Verbal cueing    Lower Body Dressing/Undressing Lower body dressing      What is the patient wearing?: Pants, Underwear/pull up     Lower body assist Assist for lower body dressing: Moderate Assistance - Patient 50 - 74%     Toileting Toileting    Toileting assist Assist for toileting: Maximal Assistance - Patient 25 - 49%     Transfers Chair/bed transfer  Transfers assist     Chair/bed transfer assist level: Moderate Assistance - Patient 50 - 74%(stand pivot)     Locomotion Ambulation   Ambulation assist   Ambulation activity did not occur: Safety/medical concerns  Assist level: 2 helpers Assistive device: Other (comment)(hallway rail) Max distance: 32ft   Walk 10 feet activity   Assist     Assist level: 2 helpers Assistive device: Other (comment)(hallway rail)   Walk 50 feet activity   Assist Walk 50 feet with 2 turns activity did not occur: Safety/medical concerns         Walk 150 feet  activity   Assist Walk 150 feet activity did not occur: Safety/medical concerns         Walk 10 feet on uneven surface  activity   Assist Walk 10 feet on uneven surfaces activity did not occur: Safety/medical concerns         Wheelchair     Assist Will patient use wheelchair at discharge?: (TBD) Type of Wheelchair: Manual    Wheelchair assist level: Set up  assist, Minimal Assistance - Patient > 75% Max wheelchair distance: 35ft    Wheelchair 50 feet with 2 turns activity    Assist        Assist Level: Set up assist, Minimal Assistance - Patient > 75%   Wheelchair 150 feet activity     Assist  Wheelchair 150 feet activity did not occur: Safety/medical concerns       Blood pressure 95/76, pulse 71, temperature 98.4 F (36.9 C), resp. rate 19, height 5\' 3"  (1.6 m), weight 86.5 kg, SpO2 96 %.  Medical Problem List and Plan: 1.Left hemiparesis and functional deficitssecondary to right MCA infarct after right ICA,MCA, ACA occlusion. Pt s/p reperfusion/stenting---embolic pattern of stroke Cont CIR PT, OT SLP  -ELOS 12/17              -left PRAFO/RWHO at HS 2. Antithrombotics: -DVT/anticoagulation:Pharmaceutical:Lovenoxadded 11/30. Will order dopplers for work up.  -antiplatelet therapy: On ASA/Brillita. 3.Chronic thoracic/ shoulder pain/Pain Management:On hydrocodone at home (went to pain clinic) Also with HA, add topiramate  4. Mood:LCSW to follow for evaluation and support. Will order sleep wake chart. -antipsychotic agents: N/A Hx of depression and anxiety - appreciate neuropsych, on Cymbalta and Klonopin 5. Neuropsych: This patientiscapable of making decisions on herown behalf. 6. Skin/Wound Care:Routine pressure relief measures. 7. Fluids/Electrolytes/Nutrition:Monitor I/O--check lytes in am.  8. R-ICAthrombus s/pstent: On Brillinta and ASA. Monitor CBC serially. Avoid hypotension. 9. Leucocytosis: Trending down with WBC 20-->10.8on 11/30. 10. Hypomagnesemia:Recheck levels in am. Likely needs multiple vitamins due to history of gastric bypass. 11. T2DM: Hgb A1C- 9.0. Was on lantus 40 units with meal coverage and invokana PTA.  CBG (last 3)  Recent Labs    03/14/19 1648 03/14/19 2102 03/15/19 0604  GLUCAP 141* 92 101*  on Lantus 15U and Invokana  controlled  12/6 12. Left thyroid nodule/Pulmonary nodules: Per CT neck 03/02/2019. Non-emergent thyroid ultrasound for follow up recommended.  13. COPD: On breo daily with albuterol prn.  14. Anxiety/depression: On Desvenlafaxine and clonazepam PTA?Now on Cymbalta 60 mg/day with Seroquel 12.5 mg am/25 mg HS---will discontinue Seroquel as has not been taking it consistently and continues to report issues with insomnia. Will resume Klonopin prn as at home andobserve. Keep sleep chart 13. Bradycardia: HR's has been down to high 40's in the past 24 hours. Asymptomatic? Will monitor for now.  14. Recurrent hypotension:Received 500 cc IVF bolus this am. Monitor BP tid for now. Vitals:   03/14/19 1946 03/15/19 0424  BP: 93/66 95/76  Pulse: 79 71  Resp: 19 19  Temp: 98.1 F (36.7 C) 98.4 F (36.9 C)  SpO2: 94% 96%  controlled 12/6 15. Left breast cancer s/p bilateral mastectomy:  16.  Dysphagia related to CVA, monitor asp PNA, cont SLP 17.  Chronic constipation will add senna and prn dulc supp  18.  Insomnia- prn trazodone not taken will schedule  LOS: 6 days A FACE TO FACE EVALUATION WAS PERFORMED  Charlett Blake 03/15/2019, 9:57 AM

## 2019-03-15 NOTE — Plan of Care (Signed)
  Problem: Consults Goal: RH STROKE PATIENT EDUCATION Description: See Patient Education module for education specifics  Outcome: Progressing Goal: Nutrition Consult-if indicated Outcome: Progressing Goal: Diabetes Guidelines if Diabetic/Glucose > 140 Description: If diabetic or lab glucose is > 140 mg/dl - Initiate Diabetes/Hyperglycemia Guidelines & Document Interventions  Outcome: Progressing   Problem: RH BOWEL ELIMINATION Goal: RH STG MANAGE BOWEL WITH ASSISTANCE Description: STG Manage Bowel with mod  I Assistance. Outcome: Progressing   Problem: RH BLADDER ELIMINATION Goal: RH STG MANAGE BLADDER WITH ASSISTANCE Description: STG Manage Bladder With mod  I Assistance Outcome: Progressing   Problem: RH SKIN INTEGRITY Goal: RH STG MAINTAIN SKIN INTEGRITY WITH ASSISTANCE Description: STG Maintain Skin Integrity With min Assistance. Outcome: Progressing   Problem: RH SAFETY Goal: RH STG ADHERE TO SAFETY PRECAUTIONS W/ASSISTANCE/DEVICE Description: STG Adhere to Safety Precautions With cues/reminders Assistance and appropriate assistive Device. Outcome: Progressing   Problem: RH PAIN MANAGEMENT Goal: RH STG PAIN MANAGED AT OR BELOW PT'S PAIN GOAL Description: <3 on a 0-10 pain scale Outcome: Progressing   Problem: RH KNOWLEDGE DEFICIT Goal: RH STG INCREASE KNOWLEDGE OF DIABETES Description: Patient will be able to demonstrate knowledge of diabetes medication, dietary restrictions, and follow up care with the MD post discharge with min assist from staff. Outcome: Progressing Goal: RH STG INCREASE KNOWLEDGE OF HYPERTENSION Description: Patient will be able to demonstrate knowledge of HTN medication, dietary restrictions, and follow up care with the MD post discharge with min assist from staff.  Outcome: Progressing Goal: RH STG INCREASE KNOWLEDGE OF STROKE PROPHYLAXIS Description: Patient Outcome: Progressing

## 2019-03-16 ENCOUNTER — Inpatient Hospital Stay (HOSPITAL_COMMUNITY): Payer: Medicare Other

## 2019-03-16 ENCOUNTER — Inpatient Hospital Stay (HOSPITAL_COMMUNITY): Payer: Medicare Other | Admitting: Occupational Therapy

## 2019-03-16 LAB — CBC
HCT: 48.4 % — ABNORMAL HIGH (ref 36.0–46.0)
Hemoglobin: 16.2 g/dL — ABNORMAL HIGH (ref 12.0–15.0)
MCH: 29.3 pg (ref 26.0–34.0)
MCHC: 33.5 g/dL (ref 30.0–36.0)
MCV: 87.7 fL (ref 80.0–100.0)
Platelets: 368 10*3/uL (ref 150–400)
RBC: 5.52 MIL/uL — ABNORMAL HIGH (ref 3.87–5.11)
RDW: 13.2 % (ref 11.5–15.5)
WBC: 9.3 10*3/uL (ref 4.0–10.5)
nRBC: 0 % (ref 0.0–0.2)

## 2019-03-16 LAB — BASIC METABOLIC PANEL
Anion gap: 12 (ref 5–15)
BUN: 19 mg/dL (ref 6–20)
CO2: 24 mmol/L (ref 22–32)
Calcium: 9.5 mg/dL (ref 8.9–10.3)
Chloride: 102 mmol/L (ref 98–111)
Creatinine, Ser: 0.72 mg/dL (ref 0.44–1.00)
GFR calc Af Amer: >60 mL/min (ref >60)
GFR calc non Af Amer: >60 mL/min (ref >60)
Glucose, Bld: 93 mg/dL (ref 70–99)
Potassium: 3.4 mmol/L — ABNORMAL LOW (ref 3.5–5.1)
Sodium: 138 mmol/L (ref 135–145)

## 2019-03-16 LAB — GLUCOSE, CAPILLARY
Glucose-Capillary: 90 mg/dL (ref 70–99)
Glucose-Capillary: 97 mg/dL (ref 70–99)
Glucose-Capillary: 85 mg/dL (ref 70–99)
Glucose-Capillary: 97 mg/dL (ref 70–99)

## 2019-03-16 MED ORDER — POTASSIUM CHLORIDE 20 MEQ PO PACK
40.0000 meq | PACK | Freq: Once | ORAL | Status: AC
  Administered 2019-03-16: 40 meq via ORAL
  Filled 2019-03-16: qty 2

## 2019-03-16 MED ORDER — POTASSIUM CHLORIDE 20 MEQ PO PACK
20.0000 meq | PACK | Freq: Three times a day (TID) | ORAL | Status: DC
  Filled 2019-03-16: qty 1

## 2019-03-16 NOTE — Progress Notes (Signed)
Occupational Therapy Session Note  Patient Details  Name: Ann Lopez MRN: [id number] Date of Birth: [birth year]  Today's Date: 03/16/2019 OT Individual Time: 1300-1410 OT Individual Time Calculation (min): 70 min   Session 2: OT Individual Time: 8295-6213 OT Individual Time Calculation (min):25  min    Short Term Goals: Week 2:  OT Short Term Goal 1 (Week 2): Pt will complete LB bathing sit to stand with min assist. OT Short Term Goal 2 (Week 2): Pt will complete UB dressing with supervision in sitting OT Short Term Goal 3 (Week 2): Pt will use the LUE as a stablilizer with mod assist during bathing tasks. OT Short Term Goal 4 (Week 2): Pt will complete toilet transfer with min assist stand pivot to the 3:1 or elevated toilet.  Skilled Therapeutic Interventions/Progress Updates:    Session 1: Pt received sitting up in the w/c with c/o back pain 4/10. Pt reports this pain is chronic, heat not making much of a difference. Pt agreable to session. Pt was transported to the therapy gym. She completed stand pivot transfer with RW, L orthosis, with mod A. Pt required max facilitation for management of LUE. Pt sat EOM and completed functional reachin g across midline, with L UE supported in elbow extended weight bearing. Min-mod facilitation provided at the elbow for maintaining extension. Min cueing throughout session for attention to task. Pt transitioned into supine with mod A. In gravity eliminated position pt was guided through abduction and adduction. Pt able to initiate Adduction with slow, purposeful movement. Trace activation of abduction with max facilitation required by OT. Pt completed facilitated closed chain BUE open palm scapular protraction with additional focus on tricep activation. Pt required max facilitation overall. Pt returned to sitting EOB with mod A for moving LLE but pt able to contribute more with pushing with RUE. Pt completed another stand pivot transfer to the w/c with  mod A. Pt was transferred back to bed with mod squat pivot.  Pt able to use R LE and RUE to pull herself up in bed with bed in trend position. Pt was left supine with LUE support in neutral forearm and slight elevation.  Therapist donned NMES using the Saebo Go stim at end of session for stimulation of left subluxation.  She was able to tolerate stimulation for 60 mins without any adverse reactions.  See settings below   Saebo Stim Go 330 pulse width 35 Hz pulse rate On 8 sec/ off 8 sec Ramp up/ down 2 sec Symmetrical Biphasic wave form  Max intensity 130m at 500 Ohm load  Session 2: Pt received supine with no c/o pain. Reporting relief from back pain previously. Pt initially upset re family visitation policy. Clarified policy, explained rationale and provided emotional support. From supine pt was able to follow demonstration for self PROM to LUE. Pt completed shoulder flexion, abd/adduction, IR/ER, wrist flex/ext, ulnar/radial deviation, pronation/supination, and thumb to finger opposition. No pain reported throughout. Min cueing provided for correcting technique. Pt left supine with all needs met, bed alarm set.   Therapy Documentation Precautions:  Precautions Precautions: Fall Required Braces or Orthoses: Other Brace Other Brace: L ankle wrapped with ACE for df assist Restrictions Weight Bearing Restrictions: No   Therapy/Group: Individual Therapy  SCurtis Sites12/10/2018, 11:43 AM

## 2019-03-16 NOTE — Progress Notes (Signed)
Physical Therapy Session Note  Patient Details  Name: Ann Lopez MRN: [id number] Date of Birth: 1970/08/15  Today's Date: 03/16/2019 PT Individual Time: 1101-1156 PT Individual Time Calculation (min): 55 min   Short Term Goals: Week 1:  PT Short Term Goal 1 (Week 1): Pt will perform supine<>sit with min assist PT Short Term Goal 2 (Week 1): Pt will perform stand pivot transfers using LRAD with min assist PT Short Term Goal 3 (Week 1): Pt will ambulate at least 70ft using LRAD with mod assist of 1 and +2 w/c follow  Skilled Therapeutic Interventions/Progress Updates:    Pt supine in bed upon PT arrival, agreeable to therapy tx and reports pain 4/10 in low back, therapist offered repositioning during session for pain relief. Therapist donned thigh high teds total assist for orthostatic hypotension management. Pt transferred to sitting EOB with min assist for L LE management. Pt performed squat pivot to w/c with min assist. Pt transported to the gym in w/c. Vitals assessed- in sitting BP 101/64, HR 75 bpm and in standing BP 83/72, HR 108 bpm. Defferred to seated activites this session secondary to fatigue, weaknes and drop in BP. Pt worked on w/c propulsion with min assist for steering x 150 ft using R hemi technique. Pt worked on L LE NMR to perform the following exercises: use of kinetron 60 cm/sec x 2 trials, active assisted L hip flexion 2 x 10, LAQ L LE and L hip abduction against manual resistance 2 x 10. Pt transported back to her room and left in w/c with needs in reach and heat pack applied for pain relief. Continued education on OOB activity tolerance for BP management.   Therapy Documentation Precautions:  Precautions Precautions: Fall Other Brace: L ankle wrapped with ACE for df assist Restrictions Weight Bearing Restrictions: No    Therapy/Group: Individual Therapy  Netta Corrigan, PT, DPT, CSRS 03/16/2019, 7:49 AM

## 2019-03-16 NOTE — Progress Notes (Signed)
Patient noted to be crying and sad this morning. This Probation officer spent some time in the room with patient and she expressed her feelings of "missing her family", " hates being here", and  "not having her independence." Patient let this wrier know she was having increased crying  spells.  This nurse listened to the patient and reminded her of her discharge date and actively listened to her. MD informed when visiting patient. Adria Devon, LPN

## 2019-03-16 NOTE — Progress Notes (Signed)
Occupational Therapy Weekly Progress Note  Patient Details  Name: Ann Lopez MRN: [id number] Date of Birth: 05/28/1970  Beginning of progress report period: November 31, 2020 End of progress report period: March 16, 2019  Today's Date: 03/16/2019 OT Individual Time: 0900-1004 OT Individual Time Calculation (min): 64 min    Patient has met 3 of 4 short term goals.  Ann Lopez is making steady progress with OT at this time.  She is able to complete squat pivot transfers to the wheelchair and the 3:1 with overall min assist to the right.  Mod assist is needed for stand pivot transfers.  She completes UB bathing and dressing at min assist level with LB bathing and dressing at mod assist following hemi dressing techniques.  She currently demonstrates Brunnstrum stage II movement in the left arm with trace movement in the internal rotators and shoulder flexors.  No active movement is noted in the hand, with max assist needed to integrate into bathing tasks.  She continues to demonstrate decreased initiation at times with decreased sustained attention to tasks as well as some left inattention, requiring mod instructional cueing to correct.  Feel overall she is making progress at an expected rate with anticipated discharge on 12/22.  Recommend continued CIR level therapy for now in order to increase ADL independence.    Patient continues to demonstrate the following deficits: muscle weakness, impaired timing and sequencing, unbalanced muscle activation and decreased coordination, decreased attention to left, decreased awareness and delayed processing and decreased sitting balance, decreased standing balance, decreased postural control, hemiplegia and decreased balance strategies and therefore will continue to benefit from skilled OT intervention to enhance overall performance with BADL and iADL.  Patient progressing toward long term goals..  Continue plan of care.  OT Short Term Goals Week 2:  OT  Short Term Goal 1 (Week 2): Pt will complete LB bathing sit to stand with min assist. OT Short Term Goal 2 (Week 2): Pt will complete UB dressing with supervision in sitting OT Short Term Goal 3 (Week 2): Pt will use the LUE as a stablilizer with mod assist during bathing tasks. OT Short Term Goal 4 (Week 2): Pt will complete toilet transfer with min assist stand pivot to the 3:1 or elevated toilet.  Skilled Therapeutic Interventions/Progress Updates:    Pt completed transfer from supine to sit on the left side of the bed with mod assist.  Min assist was needed for transfer over to the wheelchair squat pivot.  She was then able to complete stand pivot transfer with mod assist to the tub bench.  She needed mod instructional cueing for initiation and sequencing of bathing secondary to internal distraction.  Min assist for UB bathing with mod assist for LB bathing sit to stand.  She needed min assist for crossing the LLE over the right knee when washing the foot as well as for dressing tasks.  Mod assist for transfer to the wheelchair and out to the sink for dressing.  She was able to donn a pullover shirt with min assist and then completed donning underpants and pants with mod assist sit to stand.  She was able to donn her socks and shoes as well with mod assist but needed max assist to tie them.  Feel she will benefit from shoe buttons as well.  Pt finished session with completion of brushing her hair with min instructional cueing for brushing the left side more thoroughly and with completion of oral hygiene after setup of  the toothbrush.  She transferred back to the bed at completion of tasks with mod assist for stand pivot transfer and for sit to supine.  Pt left with call button and phone in reach with safety alarm in place and LUE positioned on pillows.    Therapy Documentation Precautions:  Precautions Precautions: Fall Required Braces or Orthoses: Other Brace Other Brace: L ankle wrapped with ACE  for df assist Restrictions Weight Bearing Restrictions: No   Pain: Pain Assessment Pain Score: 0-No pain ADL: See Care Tool Section for some details of mobility and selfcare  Therapy/Group: Individual Therapy  Ann Lopez OTR/L  03/16/2019, 10:42 AM

## 2019-03-16 NOTE — Progress Notes (Signed)
Pt checked on periodically though the night, eyes closed. No pain or distressed was noted.

## 2019-03-16 NOTE — Plan of Care (Signed)
  Problem: Consults Goal: RH STROKE PATIENT EDUCATION Description: See Patient Education module for education specifics  Outcome: Progressing Goal: Nutrition Consult-if indicated Outcome: Progressing Goal: Diabetes Guidelines if Diabetic/Glucose > 140 Description: If diabetic or lab glucose is > 140 mg/dl - Initiate Diabetes/Hyperglycemia Guidelines & Document Interventions  Outcome: Progressing   Problem: RH BOWEL ELIMINATION Goal: RH STG MANAGE BOWEL WITH ASSISTANCE Description: STG Manage Bowel with mod  I Assistance. Outcome: Progressing   Problem: RH BLADDER ELIMINATION Goal: RH STG MANAGE BLADDER WITH ASSISTANCE Description: STG Manage Bladder With mod  I Assistance Outcome: Progressing   Problem: RH SKIN INTEGRITY Goal: RH STG MAINTAIN SKIN INTEGRITY WITH ASSISTANCE Description: STG Maintain Skin Integrity With min Assistance. Outcome: Progressing   Problem: RH SAFETY Goal: RH STG ADHERE TO SAFETY PRECAUTIONS W/ASSISTANCE/DEVICE Description: STG Adhere to Safety Precautions With cues/reminders Assistance and appropriate assistive Device. Outcome: Progressing   Problem: RH PAIN MANAGEMENT Goal: RH STG PAIN MANAGED AT OR BELOW PT'S PAIN GOAL Description: <3 on a 0-10 pain scale Outcome: Progressing   Problem: RH KNOWLEDGE DEFICIT Goal: RH STG INCREASE KNOWLEDGE OF DIABETES Description: Patient will be able to demonstrate knowledge of diabetes medication, dietary restrictions, and follow up care with the MD post discharge with min assist from staff. Outcome: Progressing Goal: RH STG INCREASE KNOWLEDGE OF HYPERTENSION Description: Patient will be able to demonstrate knowledge of HTN medication, dietary restrictions, and follow up care with the MD post discharge with min assist from staff.  Outcome: Progressing Goal: RH STG INCREASE KNOWLEDGE OF STROKE PROPHYLAXIS Description: Patient Outcome: Progressing

## 2019-03-16 NOTE — Progress Notes (Addendum)
Pt given trazodone and a explanation of how it works . Pt worried about not getting enough sleep to help her through therapy.

## 2019-03-16 NOTE — Progress Notes (Signed)
[facility] PHYSICAL MEDICINE & REHABILITATION PROGRESS NOTE   Subjective/Complaints:  Feeling very depressed this morning. Misses her family but does not want to be a burden for them. She has had depression since 2009, but feels this was a second blow to her. Her source of joy is her granddaughter, whom she is currently skyping with. Her nurse put her family pictures around her room for her birthday, and she was touched by this gesture. She would like to speak with Neuropsych again today.   ROS:Neg CP, SOB, N/V/D   Objective:   No results found. Recent Labs    03/16/19 0639  WBC 9.3  HGB 16.2*  HCT 48.4*  PLT 368   Recent Labs    03/16/19 0639  NA 138  K 3.4*  CL 102  CO2 24  GLUCOSE 93  BUN 19  CREATININE 0.72  CALCIUM 9.5    Intake/Output Summary (Last 24 hours) at 03/16/2019 1015 Last data filed at 03/16/2019 0831 Gross per 24 hour  Intake 380 ml  Output -  Net 380 ml     Physical Exam: Vital Signs Blood pressure 113/73, pulse 69, temperature 98.5 F (36.9 C), resp. rate 18, height 5\' 3"  (1.6 m), weight 86.5 kg, SpO2 98 %.   General: Tearful, depressed. Mood and affect are appropriate Heart: Regular rate and rhythm no rubs murmurs or extra sounds Lungs: Clear to auscultation, breathing unlabored, no rales or wheezes Abdomen: Positive bowel sounds, soft nontender to palpation, nondistended Extremities: No clubbing, cyanosis, or edema Skin: No evidence of breakdown, no evidence of rash Neurologic: Cranial nerves II through XII intact, motor strength is 5/5 in right  deltoid, bicep, tricep, grip, hip flexor, knee extensors, ankle dorsiflexor and plantar flexor 0/5 in LUE and 3/5 LLE Sensory exam normal sensation to light touch and proprioception in bilateral upper and lower extremities Cerebellar exam normal finger to nose to finger as well as heel to shin in bilateral upper and lower extremities Musculoskeletal: Full range of motion in all 4 extremities. No  joint swelling   Assessment/Plan: 1. Functional deficits secondary to Right MCA infarct which require 3+ hours per day of interdisciplinary therapy in a comprehensive inpatient rehab setting.  Physiatrist is providing close team supervision and 24 hour management of active medical problems listed below.  Physiatrist and rehab team continue to assess barriers to discharge/monitor patient progress toward functional and medical goals  Care Tool:  Bathing    Body parts bathed by patient: Left arm, Chest, Abdomen, Right upper leg, Left upper leg, Right lower leg, Face, Front perineal area, Buttocks   Body parts bathed by helper: Left lower leg, Right arm     Bathing assist Assist Level: Minimal Assistance - Patient > 75%     Upper Body Dressing/Undressing Upper body dressing   What is the patient wearing?: Pull over shirt    Upper body assist Assist Level: Supervision/Verbal cueing    Lower Body Dressing/Undressing Lower body dressing      What is the patient wearing?: Pants, Underwear/pull up     Lower body assist Assist for lower body dressing: Moderate Assistance - Patient 50 - 74%     Toileting Toileting    Toileting assist Assist for toileting: Maximal Assistance - Patient 25 - 49%     Transfers Chair/bed transfer  Transfers assist     Chair/bed transfer assist level: Moderate Assistance - Patient 50 - 74%     Locomotion Ambulation   Ambulation assist   Ambulation  activity did not occur: Safety/medical concerns  Assist level: Moderate Assistance - Patient 50 - 74% Assistive device: Other (comment)(R rail) Max distance: 20 ft   Walk 10 feet activity   Assist     Assist level: Moderate Assistance - Patient - 50 - 74% Assistive device: Other (comment)   Walk 50 feet activity   Assist Walk 50 feet with 2 turns activity did not occur: Safety/medical concerns         Walk 150 feet activity   Assist Walk 150 feet activity did not occur:  Safety/medical concerns         Walk 10 feet on uneven surface  activity   Assist Walk 10 feet on uneven surfaces activity did not occur: Safety/medical concerns         Wheelchair     Assist Will patient use wheelchair at discharge?: (TBD) Type of Wheelchair: Manual    Wheelchair assist level: Set up assist, Minimal Assistance - Patient > 75% Max wheelchair distance: 42ft    Wheelchair 50 feet with 2 turns activity    Assist        Assist Level: Set up assist, Minimal Assistance - Patient > 75%   Wheelchair 150 feet activity     Assist  Wheelchair 150 feet activity did not occur: Safety/medical concerns       Blood pressure 113/73, pulse 69, temperature 98.5 F (36.9 C), resp. rate 18, height 5\' 3"  (1.6 m), weight 86.5 kg, SpO2 98 %.  Medical Problem List and Plan: 1.Left hemiparesis and functional deficitssecondary to right MCA infarct after right ICA,MCA, ACA occlusion. Pt s/p reperfusion/stenting---embolic pattern of stroke Cont CIR PT, OT SLP  -ELOS 12/17              -left PRAFO/RWHO at HS 2. Antithrombotics: -DVT/anticoagulation:Pharmaceutical:Lovenoxadded 11/30. Will order dopplers for work up.  -antiplatelet therapy: On ASA/Brillita. 3.Chronic thoracic/ shoulder pain/Pain Management:On hydrocodone at home (went to pain clinic) Also with HA, add topiramate  4. Mood:LCSW to follow for evaluation and support. Will order sleep wake chart. -antipsychotic agents: N/A Hx of depression and anxiety - appreciate neuropsych, on Cymbalta and Klonopin 5. Neuropsych: This patientiscapable of making decisions on herown behalf. 6. Skin/Wound Care:Routine pressure relief measures. 7. Fluids/Electrolytes/Nutrition:Monitor I/O--12/7 K+ is 3.3. Will supplement and repeat BMP tomorrow 8. R-ICAthrombus s/pstent: On Brillinta and ASA. Monitor CBC serially. Avoid hypotension. 9. Leucocytosis:  Trending down with WBC 20-->10.8on 11/30. 10. Hypomagnesemia:Recheck levels in am. Likely needs multiple vitamins due to history of gastric bypass. 11. T2DM: Hgb A1C- 9.0. Was on lantus 40 units with meal coverage and invokana PTA.  CBG (last 3)  Recent Labs    03/15/19 1634 03/15/19 2128 03/16/19 0610  GLUCAP 96 140* 90  on Lantus 15U and Invokana controlled  12/6 12. Left thyroid nodule/Pulmonary nodules: Per CT neck 03/02/2019. Non-emergent thyroid ultrasound for follow up recommended.  13. COPD: On breo daily with albuterol prn.  14. Anxiety/depression: On Desvenlafaxine and clonazepam PTA?Now on Cymbalta 60 mg/day with Seroquel 12.5 mg am/25 mg HS---will discontinue Seroquel as has not been taking it consistently and continues to report issues with insomnia. Will resume Klonopin prn as at home andobserve. Keep sleep chart  12/7: I spoke with Neuropsych, who will see Mrs. Lococo again today. Emphasized to her the importance of staying positive and focusing on her sources of joy--her family and granddaughter.  13. Bradycardia: HR's has been down to high 40's in the past 24 hours. Asymptomatic? Will monitor for now.  14. Recurrent hypotension:Received 500 cc IVF bolus. Monitor BP tid for now. Vitals:   03/15/19 2003 03/16/19 0512  BP: 103/66 113/73  Pulse: 68 69  Resp: 18 18  Temp: 97.9 F (36.6 C) 98.5 F (36.9 C)  SpO2: 97% 98%  controlled 12/6, 12/7 15. Left breast cancer s/p bilateral mastectomy:  16.  Dysphagia related to CVA, monitor asp PNA, cont SLP 17.  Chronic constipation will add senna and prn dulc supp  18.  Insomnia- prn trazodone not taken, will schedule  LOS: 7 days A FACE TO FACE EVALUATION WAS PERFORMED  Terance Pomplun P Estanislao Harmon 03/16/2019, 10:15 AM

## 2019-03-17 ENCOUNTER — Inpatient Hospital Stay (HOSPITAL_COMMUNITY): Payer: Medicare Other | Admitting: Occupational Therapy

## 2019-03-17 ENCOUNTER — Inpatient Hospital Stay (HOSPITAL_COMMUNITY): Payer: Medicare Other

## 2019-03-17 LAB — BASIC METABOLIC PANEL
Anion gap: 11 (ref 5–15)
BUN: 20 mg/dL (ref 6–20)
CO2: 22 mmol/L (ref 22–32)
Calcium: 9.2 mg/dL (ref 8.9–10.3)
Chloride: 103 mmol/L (ref 98–111)
Creatinine, Ser: 0.75 mg/dL (ref 0.44–1.00)
GFR calc Af Amer: >60 mL/min (ref >60)
GFR calc non Af Amer: >60 mL/min (ref >60)
Glucose, Bld: 93 mg/dL (ref 70–99)
Potassium: 3.6 mmol/L (ref 3.5–5.1)
Sodium: 136 mmol/L (ref 135–145)

## 2019-03-17 LAB — GLUCOSE, CAPILLARY
Glucose-Capillary: 81 mg/dL (ref 70–99)
Glucose-Capillary: 91 mg/dL (ref 70–99)
Glucose-Capillary: 195 mg/dL — ABNORMAL HIGH (ref 70–99)
Glucose-Capillary: 120 mg/dL — ABNORMAL HIGH (ref 70–99)

## 2019-03-17 NOTE — Progress Notes (Signed)
PHYSICAL MEDICINE & REHABILITATION PROGRESS NOTE   Subjective/Complaints:  .Mood is brighter.  Not oriented to day of week   ROS:Neg CP, SOB, N/V/D   Objective:   No results found. Recent Labs    03/16/19 0639  WBC 9.3  HGB 16.2*  HCT 48.4*  PLT 368   Recent Labs    03/16/19 0639 03/17/19 0555  NA 138 136  K 3.4* 3.6  CL 102 103  CO2 24 22  GLUCOSE 93 93  BUN 19 20  CREATININE 0.72 0.75  CALCIUM 9.5 9.2    Intake/Output Summary (Last 24 hours) at 03/17/2019 0745 Last data filed at 03/16/2019 1843 Gross per 24 hour  Intake 500 ml  Output -  Net 500 ml     Physical Exam: Vital Signs Blood pressure (!) 91/56, pulse 64, temperature 98.4 F (36.9 C), temperature source Oral, resp. rate 18, height 5\' 3"  (1.6 m), weight 86.5 kg, SpO2 99 %.   General: Tearful, depressed. Mood and affect are appropriate Heart: Regular rate and rhythm no rubs murmurs or extra sounds Lungs: Clear to auscultation, breathing unlabored, no rales or wheezes Abdomen: Positive bowel sounds, soft nontender to palpation, nondistended Extremities: No clubbing, cyanosis, or edema Skin: No evidence of breakdown, no evidence of rash Neurologic: Cranial nerves II through XII intact, motor strength is 5/5 in right  deltoid, bicep, tricep, grip, hip flexor, knee extensors, ankle dorsiflexor and plantar flexor 0/5 in LUE and 3/5 LLE Sensory exam normal sensation to light touch and proprioception in bilateral upper and lower extremities Cerebellar exam normal finger to nose to finger as well as heel to shin in bilateral upper and lower extremities Musculoskeletal: Full range of motion in all 4 extremities. No joint swelling   Assessment/Plan: 1. Functional deficits secondary to Right MCA infarct which require 3+ hours per day of interdisciplinary therapy in a comprehensive inpatient rehab setting.  Physiatrist is providing close team supervision and 24 hour management of active medical  problems listed below.  Physiatrist and rehab team continue to assess barriers to discharge/monitor patient progress toward functional and medical goals  Care Tool:  Bathing    Body parts bathed by patient: Left arm, Chest, Abdomen, Right upper leg, Left upper leg, Right lower leg, Face, Front perineal area   Body parts bathed by helper: Buttocks, Right arm, Left lower leg     Bathing assist Assist Level: Moderate Assistance - Patient 50 - 74%     Upper Body Dressing/Undressing Upper body dressing   What is the patient wearing?: Pull over shirt    Upper body assist Assist Level: Minimal Assistance - Patient > 75%    Lower Body Dressing/Undressing Lower body dressing      What is the patient wearing?: Pants, Underwear/pull up     Lower body assist Assist for lower body dressing: Moderate Assistance - Patient 50 - 74%     Toileting Toileting    Toileting assist Assist for toileting: Maximal Assistance - Patient 25 - 49%     Transfers Chair/bed transfer  Transfers assist     Chair/bed transfer assist level: Minimal Assistance - Patient > 75%     Locomotion Ambulation   Ambulation assist   Ambulation activity did not occur: Safety/medical concerns  Assist level: Moderate Assistance - Patient 50 - 74% Assistive device: Other (comment)(R rail) Max distance: 20 ft   Walk 10 feet activity   Assist     Assist level: Moderate Assistance - Patient - 84 -  74% Assistive device: Other (comment)   Walk 50 feet activity   Assist Walk 50 feet with 2 turns activity did not occur: Safety/medical concerns         Walk 150 feet activity   Assist Walk 150 feet activity did not occur: Safety/medical concerns         Walk 10 feet on uneven surface  activity   Assist Walk 10 feet on uneven surfaces activity did not occur: Safety/medical concerns         Wheelchair     Assist Will patient use wheelchair at discharge?: (TBD) Type of  Wheelchair: Manual    Wheelchair assist level: Minimal Assistance - Patient > 75% Max wheelchair distance: 150 ft    Wheelchair 50 feet with 2 turns activity    Assist        Assist Level: Minimal Assistance - Patient > 75%   Wheelchair 150 feet activity     Assist  Wheelchair 150 feet activity did not occur: Safety/medical concerns   Assist Level: Minimal Assistance - Patient > 75%   Blood pressure (!) 91/56, pulse 64, temperature 98.4 F (36.9 C), temperature source Oral, resp. rate 18, height 5\' 3"  (1.6 m), weight 86.5 kg, SpO2 99 %.  Medical Problem List and Plan: 1.Left hemiparesis and functional deficitssecondary to right MCA infarct after right ICA,MCA, ACA occlusion. Pt s/p reperfusion/stenting---likely DM and smoking as main risk ractors  Cont CIR PT, OT SLP - team conf in am  -ELOS 12/17              -left PRAFO/RWHO at HS 2. Antithrombotics: -DVT/anticoagulation:Pharmaceutical:Lovenoxadded 11/30. Will order dopplers for work up.  -antiplatelet therapy: On ASA/Brillita. 3.Chronic thoracic/ shoulder pain/Pain Management:On hydrocodone at home (went to pain clinic) Also with HA, add topiramate  4. Mood:LCSW to follow for evaluation and support. Will order sleep wake chart. -antipsychotic agents: N/A Hx of depression and anxiety - appreciate neuropsych, on Cymbalta and Klonopin 5. Neuropsych: This patientiscapable of making decisions on herown behalf. 6. Skin/Wound Care:Routine pressure relief measures. 7. Fluids/Electrolytes/Nutrition:Monitor I/O--12/7 K+ is 3.3. Will supplement and repeat BMP tomorrow 8. R-ICAthrombus s/pstent: On Brillinta and ASA. Monitor CBC serially. Avoid hypotension. 9. Leucocytosis: Trending down with WBC 20-->10.8on 11/30. 10. Hypomagnesemia:Recheck levels in am. Likely needs multiple vitamins due to history of gastric bypass. 11. T2DM: Hgb A1C- 9.0. Was on lantus  40 units with meal coverage and invokana PTA.  CBG (last 3)  Recent Labs    03/16/19 1634 03/16/19 2112 03/17/19 0608  GLUCAP 85 97 81  on Lantus 15U and Invokana controlled  12/6 12. Left thyroid nodule/Pulmonary nodules: Per CT neck 03/02/2019. Non-emergent thyroid ultrasound for follow up recommended.  13. COPD: On breo daily with albuterol prn.  14. Anxiety/depression: On Desvenlafaxine and clonazepam PTA?Now on Cymbalta 60 mg/day with Seroquel 12.5 mg am/25 mg HS---will discontinue Seroquel as has not been taking it consistently and continues to report issues with insomnia. Will resume Klonopin prn as at home andobserve. Keep sleep chart  12/7: I spoke with Neuropsych, who will see Mrs. Jedlicka again today. Emphasized to her the importance of staying positive and focusing on her sources of joy--her family and granddaughter.  13. Bradycardia: HR's has been down to high 40's in the past 24 hours. Asymptomatic? Will monitor for now.  14.No hx HTN, has historically run low BPs  Vitals:   03/16/19 2020 03/17/19 0423  BP: 96/74 (!) 91/56  Pulse: 68 64  Resp: 17 18  Temp:  97.9 F (36.6 C) 98.4 F (36.9 C)  SpO2: 98% 99%  controlled 12/6, 12/7 15. Left breast cancer s/p bilateral mastectomy:  16.  Dysphagia related to CVA, monitor asp PNA, cont SLP 17.  Chronic constipation will add senna and prn dulc supp  18.  Insomnia- prn trazodone not taken, will schedule  LOS: 8 days A FACE TO FACE EVALUATION WAS PERFORMED  Charlett Blake 03/17/2019, 7:45 AM

## 2019-03-17 NOTE — Progress Notes (Signed)
Physical Therapy Session Note  Patient Details  Name: Ann Lopez MRN: [id number] Date of Birth: 09-Apr-1971  Today's Date: 03/17/2019 PT Individual Time: 0902-1000 and 1332- 1430 PT Individual Time Calculation (min): 58 min  And 58 min   Short Term Goals: Week 1:  PT Short Term Goal 1 (Week 1): Pt will perform supine<>sit with min assist PT Short Term Goal 2 (Week 1): Pt will perform stand pivot transfers using LRAD with min assist PT Short Term Goal 3 (Week 1): Pt will ambulate at least 77ft using LRAD with mod assist of 1 and +2 w/c follow  Skilled Therapeutic Interventions/Progress Updates:    Session 1: Pt supine in bed upon PT arrival, agreeable to therapy tx and denies pain. Therapist donned shoes total assist for time management. Pt reports feeling much better today, pt very motivated to do therapy today and eager to get out of bed. "I feel like I could run today." Pt transferred to sitting EOB with min assist and performed stand pivot to w/c with mod assist, transported to the gym. Vitals assessed- Sitting- 105/80, 79 bpm, Standing- 80/65, 111 bpm - Pt denies symptoms this session. Pt performed x 10 sit<>stands without AD/UE support in order to work on LE strength, L knee control and NMR, facilitation to maintain symmetric LE weightbearing. Donned L AFO to trial this session. Pt worked on pre-gait and L stance control to perform the following tasks this session: stepping in place with R LE x 10 with UE support on RW & therapist assisting with L knee control, lateral sidesteps in place with R LE x 15 without UE support & therapist facilitating L hip extension with cues for upright posture. Pt performed squat pivot to w/c with min assist. PT ambulated x 30 ft at the R rail this session with mod assist, facilitation for L hip extension & knee control during stance, pt advances L LE during swing but requires cues for foot placement. Pt transported back to room and left in w/c with needs in  reach and chair alarm set. Heat applied for pain relief.   Session 2: Pt using the bathroom with assist from NT upon PT arrival, transferred to sink within stedy, seated on stedy seat while washing hands at the sink, hand over hand assist for L UE use. Transferred to w/c with stedy. Therapist donned teds, shoes and L AFO total assist, transported to gym in w/c. Orthotist present for consultation. Pt ambulated x 30 ft with R rail in hallway, L AFO and mod assist, facilitation for L hip extension during stance, facilitation for L knee control and cues for foot placement/step length. Pt with intermittent L knee flexion/hyperextension during gait, therapist blocking hyperextension. Added heel wedge to L shoe and trial of L GRAFO, ambulated x 10 ft with mod assist, this AFO too rigid, pushing pt into more knee hyperextension and also causing pt to flex forward at the hip. Between bouts of gait worked on midline static standing without UE support, worked on L stance control with R UE support while stepping place with R LE. Pt performed x 10 sit<>Stands from w/c pushing up through hands on L knee for increased L weightshift, working on strength and L NMR. Pt performed x 10 mini squats this session working on knee control, therapist provided facilitation for L knee control to block hyperextension. Pt transported back to room and left in w/c with needs in reach and chair alarm set.   Therapy Documentation Precautions:  Precautions Precautions:  Fall Required Braces or Orthoses: Other Brace Other Brace: L ankle wrapped with ACE for df assist Restrictions Weight Bearing Restrictions: No    Therapy/Group: Individual Therapy  Netta Corrigan, PT, DPT, CSRS 03/17/2019, 7:54 AM

## 2019-03-17 NOTE — Progress Notes (Addendum)
Occupational Therapy Session Note  Patient Details  Name: Ann Lopez MRN: [id number] Date of Birth: October 21, 1970  Today's Date: 03/17/2019 OT Individual Time: [id number] OT Individual Time Calculation (min): 57 min    Short Term Goals: Week 2:  OT Short Term Goal 1 (Week 2): Pt will complete LB bathing sit to stand with min assist. OT Short Term Goal 2 (Week 2): Pt will complete UB dressing with supervision in sitting OT Short Term Goal 3 (Week 2): Pt will use the LUE as a stablilizer with mod assist during bathing tasks. OT Short Term Goal 4 (Week 2): Pt will complete toilet transfer with min assist stand pivot to the 3:1 or elevated toilet.  Skilled Therapeutic Interventions/Progress Updates:    Pt worked on bathing and dressing during session.  Mod assist for stand pivot transfer to the tub bench from the wheelchair.  She then needed supervision for removal of the pullover shirt with mod assist to remove all LB clothing.  She was able to complete UB bathing in sitting with min assist and max assist hand over hand for use of the LUE for washing the right arm.  She needed min assist for washing her LB with mod assist for crossing the LLE over the right knee.  Mod instructional cueing for sequencing bathing secondary to perseveration on rinsing off.  She transferred to the wheelchair with mod assist for dressing tasks.  Min instructional cueing with mod assist to donn underclothing and pants.  Min assist was needed for UB dressing.  Therapist assisted with gripper socks and then assisted with transfer to the bed with mod assist.  Min facilitation needed for sit to supine.  Finished session with application of the NMES to the left shoulder for treatment of subluxation.  She was able to tolerate for 60 mins without any adverse reactions.  She was able to tolerate 60 mins without any adverse reactions to stimulation.  Pt left in bed with call button and phone in reach.  See NMES settings  below  [place] Stim Go 330 pulse width 35 Hz pulse rate On 8 sec/ off 8 sec Ramp up/ down 2 sec Symmetrical Biphasic wave form  Max intensity 129mA at 500 Ohm load  Therapy Documentation Precautions:  Precautions Precautions: Fall Required Braces or Orthoses: Other Brace Other Brace: L AFO Restrictions Weight Bearing Restrictions: No   Pain:   No report of pain ADL: See Care Tool for some details of mobility and selfcare  Therapy/Group: Individual Therapy  Channah Godeaux OTR/L 03/17/2019, 3:49 PM

## 2019-03-18 ENCOUNTER — Inpatient Hospital Stay (HOSPITAL_COMMUNITY): Payer: Medicare Other | Admitting: Occupational Therapy

## 2019-03-18 ENCOUNTER — Inpatient Hospital Stay (HOSPITAL_COMMUNITY): Payer: Medicare Other

## 2019-03-18 ENCOUNTER — Encounter (HOSPITAL_COMMUNITY): Payer: Medicare Other | Admitting: Psychology

## 2019-03-18 LAB — GLUCOSE, CAPILLARY
Glucose-Capillary: 92 mg/dL (ref 70–99)
Glucose-Capillary: 101 mg/dL — ABNORMAL HIGH (ref 70–99)
Glucose-Capillary: 111 mg/dL — ABNORMAL HIGH (ref 70–99)
Glucose-Capillary: 155 mg/dL — ABNORMAL HIGH (ref 70–99)

## 2019-03-18 MED ORDER — DOCUSATE SODIUM 100 MG PO CAPS
200.0000 mg | ORAL_CAPSULE | Freq: Every day | ORAL | Status: DC
  Administered 2019-03-18 – 2019-03-20 (×3): 200 mg via ORAL
  Filled 2019-03-18 (×3): qty 2

## 2019-03-18 NOTE — Patient Care Conference (Signed)
Inpatient RehabilitationTeam Conference and Plan of Care Update Date: 03/18/2019   Time: 10:35 AM   Patient Name: Ann Lopez      Medical Record Number: [id number]  Date of Birth: [birth year] Sex: Female         Room/Bed: 4W17C/4W17C-01 Payor Info: Payor: [employer] MEDICARE / Plan: [facility] MEDICARE / Product Type: *No Product type* /    Admit Date/Time:  03/09/2019  2:20 PM  Primary Diagnosis:  <principal problem not specified>  Patient Active Problem List   Diagnosis Date Noted  . Major depressive disorder, recurrent episode, moderate ([place])   . MCI (mild cognitive impairment) with memory loss   . Acute ischemic right middle cerebral artery (MCA) stroke ([place]) 03/09/2019  . Left hemiparesis ([place])   . Bradycardia   . Tachypnea   . SIRS (systemic inflammatory response syndrome) ([facility])   . Hypokalemia   . Diabetes mellitus type 2 in obese ([place])   . Tobacco abuse   . History of breast cancer   . Cognitive deficits   . Dysphagia, post-stroke   . Stroke (cerebrum) ([place]) 03/02/2019  . Middle cerebral artery embolism, right 03/02/2019  . Chest pain 12/19/2015  . Numbness 12/19/2015  . Femoral hernia 03/24/2014  . Dyslipidemia 09/10/2011  . BRCA1 positive 09/10/2011  . Breast cancer ([place]) 09/10/2011  . H/O gastric bypass; 05/14/11([facility]) 09/10/2011  . Essential hypertension 10/28/2008  . DEPRESSION/ANXIETY 02/19/2007  . CARPAL TUNNEL SYNDROME, RIGHT 02/19/2007  . ALLERGIC RHINITIS 02/19/2007  . GERD 02/19/2007  . IRRITABLE BOWEL SYNDROME 02/19/2007  . HERPES GENITALIS 12/30/2006  . Diabetes ([place]) 12/30/2006  . OSTEOPENIA 12/30/2006  . ANOMALY, CONGENITAL, NERVOUS SYSTEM NEC 12/30/2006    Expected Discharge Date: Expected Discharge Date: 03/31/19  Team Members Present: Physician leading conference: Dr. Alysia Penna Social Worker Present: Ovidio Kin, LCSW Nurse Present: Isla Pence, RN Case Manager: Karene Fry, RN PT Present: Michaelene Song, PT OT Present:  Clyda Greener, OT SLP Present: Jettie Booze, CF-SLP PPS Coordinator present : Gunnar Fusi, SLP     Current Status/Progress Goal Weekly Team Focus  Bowel/Bladder   Continent x2 with occassional episodes of incontinenece  Maintain continence. timed toileting while awake.  assess toileting needs qshift and PRN   Swallow/Nutrition/ Hydration             ADL's   Pt completed UB bathing with min assist as well as UB dressing.  LB bathing and dressing is completed at mod assist level sit to stand.  She was able to complete toilet transfers and shower transfers with mod assist.  Left arm is currently Brunnstrum stage II with stage I in the hand.  min guard overall  neuromuscular re-education, balance retraining, transfer training, DME education, therapeutic activity, pt education   Mobility   min assist bed mobility, min assist squat pivot/mod assist stand pivot transfers, gait 30 ft with R rail and mod assist & L AFO  CGA bed mobility and transfers; min assist gait 139f using LRAD; and min assist 3 stair navigation using handrails  strength, activity tolerance, L attention, bed mobility, transfers, gait training, L LE NMR, education, awareness   Communication             Safety/Cognition/ Behavioral Observations            Pain   Denies pain  remain free of pain  assess pain qshift and PRN   Skin   No skin impairments  No new breakdown of infection.  assess skin qshift  and PRN      *See Care Plan and progress notes for long and short-term goals.     Barriers to Discharge  Current Status/Progress Possible Resolutions Date Resolved   Nursing                  PT  Inaccessible home environment;Home environment access/layout  3 STE              OT                  SLP                SW                Discharge Planning/Teaching Needs:  Pt having difficulty coping neuro-psych seeing, making progress but doesn't feel fast enough and wanting to go home. Aware will need 24 hr  physical care at DC      Team Discussion: Less anxious, sleep off, no return UE.  RN - A+O, cont B/B, BS up last pm.  OT min UB B/D, LB B/D mod, stand pivot transfers mod/max, squat min A, goals minguard.  PT min A bed, transfers min, sit to stand min, amb 30' mod A, temporary AFO to limit ankle rolling.  SLP DC.  Fam ed needed before DC.   Revisions to Treatment Plan: N/a     Medical Summary Current Status: Anxiety and depression improving, left-sided weakness without much change Weekly Focus/Goal: Neuromuscular reeducation improve mobility, manage hypotension  Barriers to Discharge: Other (comments);Medical stability  Barriers to Discharge Comments: Prior history of low BPs although asymptomatic Possible Resolutions to Barriers: Mechanical treatments to avoid blood pressure drops, monitor orthostasis, try to avoid medications   Continued Need for Acute Rehabilitation Level of Care: The patient requires daily medical management by a physician with specialized training in physical medicine and rehabilitation for the following reasons: Direction of a multidisciplinary physical rehabilitation program to maximize functional independence : Yes Medical management of patient stability for increased activity during participation in an intensive rehabilitation regime.: Yes Analysis of laboratory values and/or radiology reports with any subsequent need for medication adjustment and/or medical intervention. : Yes   I attest that I was present, lead the team conference, and concur with the assessment and plan of the team.   Retta Diones 03/18/2019, 9:52 PM  Team conference was held via web/ teleconference due to [place] - 19

## 2019-03-18 NOTE — Progress Notes (Signed)
[facility] PHYSICAL MEDICINE & REHABILITATION PROGRESS NOTE   Subjective/Complaints:  .Mood is brighter.  Working with OT< feels the urge for BM   ROS:Neg CP, SOB, N/V/D   Objective:   No results found. Recent Labs    03/16/19 0639  WBC 9.3  HGB 16.2*  HCT 48.4*  PLT 368   Recent Labs    03/16/19 0639 03/17/19 0555  NA 138 136  K 3.4* 3.6  CL 102 103  CO2 24 22  GLUCOSE 93 93  BUN 19 20  CREATININE 0.72 0.75  CALCIUM 9.5 9.2    Intake/Output Summary (Last 24 hours) at 03/18/2019 0826 Last data filed at 03/17/2019 1902 Gross per 24 hour  Intake 660 ml  Output -  Net 660 ml     Physical Exam: Vital Signs Blood pressure 104/64, pulse (!) 59, temperature (!) 97.5 F (36.4 C), temperature source Oral, resp. rate 18, height 5' 3"  (1.6 m), weight 86.5 kg, SpO2 99 %.   General: Tearful, depressed. Mood and affect are appropriate Heart: Regular rate and rhythm no rubs murmurs or extra sounds Lungs: Clear to auscultation, breathing unlabored, no rales or wheezes Abdomen: Positive bowel sounds, soft nontender to palpation, nondistended Extremities: No clubbing, cyanosis, or edema Skin: No evidence of breakdown, no evidence of rash Neurologic: Cranial nerves II through XII intact, motor strength is 5/5 in right  deltoid, bicep, tricep, grip, hip flexor, knee extensors, ankle dorsiflexor and plantar flexor 0/5 in LUE and 3/5 LLE Sensory exam normal sensation to light touch and proprioception in bilateral upper and lower extremities Cerebellar exam normal finger to nose to finger as well as heel to shin in bilateral upper and lower extremities Musculoskeletal: Full range of motion in all 4 extremities. No joint swelling   Assessment/Plan: 1. Functional deficits secondary to Right MCA infarct which require 3+ hours per day of interdisciplinary therapy in a comprehensive inpatient rehab setting.  Physiatrist is providing close team supervision and 24 hour management  of active medical problems listed below.  Physiatrist and rehab team continue to assess barriers to discharge/monitor patient progress toward functional and medical goals  Care Tool:  Bathing    Body parts bathed by patient: Left arm, Chest, Abdomen, Right upper leg, Left upper leg, Right lower leg, Face, Front perineal area   Body parts bathed by helper: Buttocks, Abdomen, Right arm     Bathing assist Assist Level: Minimal Assistance - Patient > 75%     Upper Body Dressing/Undressing Upper body dressing   What is the patient wearing?: Pull over shirt    Upper body assist Assist Level: Minimal Assistance - Patient > 75%    Lower Body Dressing/Undressing Lower body dressing      What is the patient wearing?: Pants, Underwear/pull up     Lower body assist Assist for lower body dressing: Moderate Assistance - Patient 50 - 74%     Toileting Toileting    Toileting assist Assist for toileting: Maximal Assistance - Patient 25 - 49%     Transfers Chair/bed transfer  Transfers assist     Chair/bed transfer assist level: Moderate Assistance - Patient 50 - 74%     Locomotion Ambulation   Ambulation assist   Ambulation activity did not occur: Safety/medical concerns  Assist level: Moderate Assistance - Patient 50 - 74% Assistive device: Other (comment)(R rail) Max distance: 30 ft   Walk 10 feet activity   Assist     Assist level: Moderate Assistance - Patient -  50 - 74% Assistive device: Other (comment)(R rail)   Walk 50 feet activity   Assist Walk 50 feet with 2 turns activity did not occur: Safety/medical concerns         Walk 150 feet activity   Assist Walk 150 feet activity did not occur: Safety/medical concerns         Walk 10 feet on uneven surface  activity   Assist Walk 10 feet on uneven surfaces activity did not occur: Safety/medical concerns         Wheelchair     Assist Will patient use wheelchair at discharge?:  (TBD) Type of Wheelchair: Manual    Wheelchair assist level: Minimal Assistance - Patient > 75% Max wheelchair distance: 150 ft    Wheelchair 50 feet with 2 turns activity    Assist        Assist Level: Minimal Assistance - Patient > 75%   Wheelchair 150 feet activity     Assist  Wheelchair 150 feet activity did not occur: Safety/medical concerns   Assist Level: Minimal Assistance - Patient > 75%   Blood pressure 104/64, pulse (!) 59, temperature (!) 97.5 F (36.4 C), temperature source Oral, resp. rate 18, height '5\' 3"'$  (1.6 m), weight 86.5 kg, SpO2 99 %.  Medical Problem List and Plan: 1.Left hemiparesis and functional deficitssecondary to right MCA infarct after right ICA,MCA, ACA occlusion. Pt s/p reperfusion/stenting---likely DM and smoking as main risk ractors  Cont CIR PT, OT SLP - Team conference today please see physician documentation under team conference tab, met with team face-to-face to discuss problems,progress, and goals. Formulized individual treatment plan based on medical history, underlying problem and comorbidities. -ELOS 12/22              -left PRAFO/RWHO at HS 2. Antithrombotics: -DVT/anticoagulation:Pharmaceutical:Lovenoxadded 11/30. Will order dopplers for work up.  -antiplatelet therapy: On ASA/Brillita. 3.Chronic thoracic/ shoulder pain/Pain Management:On hydrocodone at home (went to pain clinic) Also with HA, add topiramate  4. Mood:LCSW to follow for evaluation and support. Will order sleep wake chart. -antipsychotic agents: N/A Hx of depression and anxiety - appreciate neuropsych, on Cymbalta and Klonopin 5. Neuropsych: This patientiscapable of making decisions on herown behalf. 6. Skin/Wound Care:Routine pressure relief measures. 7. Fluids/Electrolytes/Nutrition:Monitor I/O-K+ normalized 8. R-ICAthrombus s/pstent: On Brillinta and ASA. Monitor CBC serially. Avoid  hypotension. 9. Leucocytosis: Trending down with WBC 20-->10.8on 11/30. 10. Hypomagnesemia:Recheck levels in am. Likely needs multiple vitamins due to history of gastric bypass. 11. T2DM: Hgb A1C- 9.0. Was on lantus 40 units with meal coverage and invokana PTA.  CBG (last 3)  Recent Labs    03/17/19 1626 03/17/19 2046 03/18/19 0549  GLUCAP 195* 120* 92  on Lantus 15U and Invokana controlled  12/6 12. Left thyroid nodule/Pulmonary nodules: Per CT neck 03/02/2019. Non-emergent thyroid ultrasound for follow up recommended.  13. COPD: On breo daily with albuterol prn.  14. Anxiety/depression: On Desvenlafaxine and clonazepam PTA?Now on Cymbalta 60 mg/day with Seroquel 12.5 mg am/25 mg HS---will discontinue Seroquel as has not been taking it consistently and continues to report issues with insomnia. Will resume Klonopin prn as at home andobserve. Keep sleep chart- 7.5 hr last noc     13. Bradycardia: HR's has been down to high 40's in the past 24 hours. Asymptomatic? Will monitor for now.  14.No hx HTN, has historically run low BPs  Vitals:   03/17/19 1959 03/18/19 0602  BP: 109/64 104/64  Pulse: 69 (!) 59  Resp: 18   Temp: (!)  97.5 F (36.4 C)   SpO2: 99%   controlled 12/6, 12/7 15. Left breast cancer s/p bilateral mastectomy:  16.  Dysphagia related to CVA, monitor asp PNA, cont SLP 17.  Chronic constipation will add senna and prn dulc supp  18.  Insomnia- prn trazodone not taken, will schedule  LOS: 9 days A FACE TO FACE EVALUATION WAS PERFORMED  Charlett Blake 03/18/2019, 8:26 AM

## 2019-03-18 NOTE — Progress Notes (Signed)
Occupational Therapy Session Note  Patient Details  Name: KAYLYN MILKEY MRN: [id number] Date of Birth: 1971-02-08  Today's Date: 03/18/2019 OT Individual Time: [id number] OT Individual Time Calculation (min): 77 min    Short Term Goals: Week 2:  OT Short Term Goal 1 (Week 2): Pt will complete LB bathing sit to stand with min assist. OT Short Term Goal 2 (Week 2): Pt will complete UB dressing with supervision in sitting OT Short Term Goal 3 (Week 2): Pt will use the LUE as a stablilizer with mod assist during bathing tasks. OT Short Term Goal 4 (Week 2): Pt will complete toilet transfer with min assist stand pivot to the 3:1 or elevated toilet.  Skilled Therapeutic Interventions/Progress Updates:    Pt completed self feeding to start session from bed level.  She was able to complete supine to sit EOB with min assist to the right and mod instructional cueing to sequence the movement.  She was then able to complete squat pivot transfer to the wheelchair with min assist to the left.  She completed functional mobility from the wheelchair to the 3:1 over the toilet with max assist and therapist facilitation.  She needs max facilitation in the LLE for positioning of it during swing phase with mod assist for stabilization of the knee with stance phase.  She completed toilet hygiene and clothing management with mod assist sit to stand and then ambulated out to the wheelchair again at the same level.  She next washed her hands with min assist and then completed oral hygiene with supervision from the wheelchair.  Integrated the LUE as an active assist for holding the toothpaste and applying it with max hand over hand assist.  Finished session with pt in the wheelchair and the LUE supported on the half lap tray.  Therapist applied NMES to the left shoulder for treatment of subluxation.  See below for settings.  She reported some pain in the left shoulder at end of stimulation with the intensity set on seven  clicks.  No redness or issues noted in the arm once removed.  Pt left with call button and phone in reach with safety belt in place.    Saebo Stim Go 330 pulse width 35 Hz pulse rate On 8 sec/ off 8 sec Ramp up/ down 2 sec Symmetrical Biphasic wave form  Max intensity 170mA at 500 Ohm load  Therapy Documentation Precautions:  Precautions Precautions: Fall Precaution Comments: left hemiparesis Required Braces or Orthoses: Other Brace Other Brace: L AFO Restrictions Weight Bearing Restrictions: No  Pain: Pain Assessment Pain Scale: Faces Pain Score: 0-No pain ADL: See Care Tool Section for some details of mobility and selfcare  Therapy/Group: Individual Therapy  Khamya Topp OTR/L 03/18/2019, 10:38 AM

## 2019-03-18 NOTE — Consult Note (Signed)
Neuropsychological Consultation   Patient:   Ann Lopez   DOB:   06-Sep-1970  MR Number:  [id number]  Location:  [facility] [id number] [place] [address] Dept: [place]: [phone]           Date of Service:   03/18/2019  Start Time:   3 PM End Time:   4 PM  Provider/Observer:  Ilean Skill, Psy.D.       Clinical Neuropsychologist       Billing Code/Service: [id number]/[id number]  Chief Complaint:    Ann Lopez is a 48 year old female with a history of type 2 diabetes, hypertension, gastric sleeve, breast cancer with bilateral mastectomy with BRCA gene and strong family history of BRCA gene.  The patient also describes late effects of mild cognitive impairments memory impairments from chemotherapy, tobacco use, migraines, back pain.  The patient was admitted on 03/01/2019 after a fall from the commode and found to have left-sided weakness with left neglect and right gaze preference.  Patient with UDS positive for opioids and amphetamines.  Patient denies amphetamine use and may be FP. CT head has shown infarction right lentiform nucleus and resolving confluent petechial hemorrhae and persistent partial effacement of right lateral ventricle.  Felt to have been embolic in nature due to large vessel stenosis.  Patient with residual left facial droop and cognitive deficits, left sided weakness (improving left leg) and improved right side gaze preference.  The patient has been improving during rehab and is made significant improvements in left leg function.  She continues to have significant motor deficits in her left arm.  Reason for Service:  [id number] Ann Lopez is a 48 year old female with history of T2DM, HTN, gastricsleeve, breast cancer s/p bilateral mastectomywith TAH/BSOand post chemo memory deficits, tobacco use, migraines, back pain who was admitted on 03/02/19 after fall from  the commode and found to have left sided weakness with left neglect and right gaze preference. UDS positive with opioids and amphetamines. CTA/P head revealed near occlusive thrombus within R-ICA terminus extending into M1 R-MCA with A1 R-ACA with core infarct in R-MCA and large penumbra as well as incidental finding of left thyroid nodule. She was taken for cerebral angio with emergent thrombectomy and complete revascularization of occluded R- ICA terminus, R-MCA and R-ACA with stent assisted angioplasty of symptomatic proximal R-ICA stenosis by DR. Deveshwar. Post procedure with hypotension felt to be due to carotid manipulation as well as decrease in LOC with increase in LLE tone. EEG showed diffuse encephalopathy and repeat CT head showed some mass effect on right lateral ventricle and was negative for hemorrhage.   She tolerated extubation without difficulty but had bouts of agitation alternating with lethargy. 2D echo showed EF 60-65% with no LVH and no wall abnormality. Most recent CT head 11/25 showed residual hyperdensity within infarcted right lentiform nucleus c/w resolving confluent petechial hemorrhage and persistent partial effacement of right lateral ventricle. Dr. Erlinda Hong felt that stroke embolic due to large vessel stenosis but PAF not fully ruled out--30 day cardiac event monitor recommended on outpatient basis. Neurology recommending SBP goal 120-140--hypotensive today with SBP in 90's thereforetreated with fluid bolus.Patient with resultant left facial droop with cognitive deficits, left sided weakness and right gaze preference with left neglect affecting mobility and ADLs. CIR recommended due to functional decline.  Current Status:  The patient reports a significant worsening in her depressive symptoms recently.  The patient acknowledges  suicidal ideation at times but denies any motivation to actually harm herself.  She does contract for safety.  The patient does not appear to be an  imminent danger for self harm at this time.  This patient acknowledges her significant history of medical issues including her breast cancer and bilateral mastectomy as well as knowing about her risk factor for stroke for many years due to extremely high levels of triglycerides..    Behavioral Observation: Ann Lopez  presents as a 48 y.o.-year-old Right Caucasian Female who appeared her stated age. her dress was Appropriate and she was Well Groomed and her manners were Appropriate to the situation.  her participation was indicative of Appropriate and Attentive behaviors.  There were any physical disabilities noted.  she displayed an appropriate level of cooperation and motivation.     Interactions:    Active Appropriate and Redirectable  Attention:   abnormal and attention span appeared shorter than expected for age  Memory:   abnormal; remote memory intact, recent memory impaired  Visuo-spatial:  not examined but indications of visual neglect  Speech (Volume):  normal  Speech:   normal; normal  Thought Process:  Coherent and Relevant  Though Content:  WNL; not suicidal and not homicidal  Orientation:   person, place, time/date and situation  Judgment:   Fair  Planning:   Fair  Affect:    Appropriate  Mood:    Dysphoric with history of significant depressive events.  Insight:   Fair  Intelligence:   normal   Medical History:   Past Medical History:  Diagnosis Date  . Allergy   . Anxiety   . Asthma   . Back pain   . Breast cancer ([place])   . Cancer ([place])   . Depression   . Diabetes mellitus   . Headache   . Hyperlipemia   . Osteopenia   . Osteoporosis    osteopenia        Psychiatric History:  Past history of depression and anxiety.    Family Med/Psych History:  Family History  Problem Relation Age of Onset  . Cancer Mother 14       breast cancer  . Hypertension Sister   . Hypothyroidism Sister   . Cancer Sister   . Hypothyroidism Brother   . Cancer  Maternal Grandmother 88       breast cancer  . Cancer Maternal Grandfather 55       pancreatic cancer    Impression/DX:  Ann Lopez is a 48 year old female with a history of type 2 diabetes, hypertension, gastric sleeve, breast cancer with bilateral mastectomy with BRCA gene and strong family history of BRCA gene.  The patient also describes late effects of mild cognitive impairments memory impairments from chemotherapy, tobacco use, migraines, back pain.  The patient was admitted on 03/01/2019 after a fall from the commode and found to have left-sided weakness with left neglect and right gaze preference.  Patient with UDS positive for opioids and amphetamines.  Patient denies amphetamine use and may be FP. CT head has shown infarction right lentiform nucleus and resolving confluent petechial hemorrhae and persistent partial effacement of right lateral ventricle.  Felt to have been embolic in nature due to large vessel stenosis.  Patient with residual left facial droop and cognitive deficits, left sided weakness (improving left leg) and improved right side gaze preference.  The patient reports a significant worsening in her depressive symptoms recently.  The patient acknowledges suicidal ideation at times but  denies any motivation to actually harm herself.  She does contract for safety.  The patient does not appear to be an imminent danger for self harm at this time.  This patient acknowledges her significant history of medical issues including her breast cancer and bilateral mastectomy as well as knowing about her risk factor for stroke for many years due to extremely high levels of triglycerides.  Disposition/Plan:  Today we worked specifically on issues of her depression and her cognitive interpretations of the situation she is at and and her expectations and predictions about the future.  The patient is catastrophizing and perceiving her future functioning to be identical to where she has at this  moment.  The patient is, however, making some functional gains and has been able to stand on her leg and do some walking recently.  The patient reports that she had not actually been looking at how much she had improved since her stroke but was being consumed by thoughts of being a burden towards her family going forward.  We worked on developing some coping skills and strategies around issues of depression.  The patient acknowledges some suicidal ideation she does contract for safety and has not developed any plan or intention to actually harm herself but more question why she survived her stroke only to be dealing with all of the symptoms.        Electronically Signed   _______________________ Ilean Skill, Psy.D.

## 2019-03-18 NOTE — Progress Notes (Signed)
Physical Therapy Weekly Progress Note  Patient Details  Name: Ann Lopez MRN: [id number] Date of Birth: 07/28/1970  Beginning of progress report period: March 10, 2019 End of progress report period: March 18, 2019  Today's Date: 03/18/2019 PT Individual Time: 7353-2992 and 1130-1200 PT Individual Time Calculation (min): 43 min and 30 min    Patient has met 3 of 3 short term goals. Pt progressing with functional mobility and is gaining strength/return in her Left lower extremity. Pt continues to demonstrate poor L knee and hip control during gait and does not demonstrate any ankle musculature return. Pt requires min assist for sit<>stands and transfers, she is ambulating up to 30 ft with R rail in hallway and use of L AFO with mod assist.   Patient continues to demonstrate the following deficits muscle weakness, abnormal tone and decreased coordination, decreased attention and decreased awareness and decreased standing balance, decreased postural control, hemiplegia and decreased balance strategies and therefore will continue to benefit from skilled PT intervention to increase functional independence with mobility.  Patient progressing toward long term goals..  Continue plan of care.  PT Short Term Goals Week 1:  PT Short Term Goal 1 (Week 1): Pt will perform supine<>sit with min assist PT Short Term Goal 1 - Progress (Week 1): Met PT Short Term Goal 2 (Week 1): Pt will perform stand pivot transfers using LRAD with min assist PT Short Term Goal 2 - Progress (Week 1): Met PT Short Term Goal 3 (Week 1): Pt will ambulate at least 83f using LRAD with mod assist of 1 and +2 w/c follow PT Short Term Goal 3 - Progress (Week 1): Met Week 2:  PT Short Term Goal 1 (Week 2): Pt will ambulate x50 ft with min assist and LRAD PT Short Term Goal 2 (Week 2): Pt will perform bed mobility CGA PT Short Term Goal 3 (Week 2): Pt will initiate stair training  Skilled Therapeutic Interventions/Progress  Updates:    Session 1: Pt seated in w/c upon PT arrival, agreeable to therapy tx and reports back pain 3/10. Pt transported to the gym in w/c. Pt performed stand pivot to mat with min assist. Pt performed x 10 sit<>stands without UE support and x 10 mini squats this session without UE support, min-mod assist working on L knee control and quad activation with tactile cues to block hyperextension and manual facilitation to maintain midline weightbearing through LEs. Pt transferred to w/c with min assist, cues for techniques and hand placement. Pt transferred from sitting in w/c>standing>tall kneeling on the mat using bench for UE support. In tall kneeling pt worked on glute activation wit tactile cues for activation. In tall kneeling pt worked on L LE weightbearing, hip extensor and abductor strengthening/NMR to perform R LE sidesteps in place. Pt performed hip extensions in tall kneeling, sitting back towards heels and then coming up to full hip extension with verbal/tactile cues for techniques. Pt transferred back to sitting edge of mat with min assist. Pt worked on L stance control to perform R sidesteps in place x 15, and to perform R forwards/backwards steps in place x 15. Pt performed alternating stepping in place with each LE using RW for UE support, L AFO donned with cues for foot placement. Stand pivot to w/c with min assist, transported back to room and left in w/c with needs in reach and chair alarm set.   Session 2: Pt seated in w/c upon PT arrival, agreeable to therapy tx and reports low  back pain 3/10 at rest. Offered repositioning for pain relief. Pt transported to the gym. Pt ambulated x 30 ft and x 20 ft this session with R rail in hall and use of L AFO, cues for step length and foot placement, facilitation for weightshifting and L knee control to limit hyprextension, cues/facilitation for L glute activation during stance. Trial of knee cage during second bout of gait but this cage was not  adequately blocking hyperextensino due to large size. Pt performed sit<>stands facing the rail and worked on L stance control while performing R side steps in place x 20. Pt transported to dayroom and used kinetron x 2 bouts for hip strengthening. Transported back to room and left in w/c with needs in reach and chair alarm set.   Therapy Documentation Precautions:  Precautions Precautions: Fall Required Braces or Orthoses: Other Brace Other Brace: L AFO Restrictions Weight Bearing Restrictions: No   Therapy/Group: Individual Therapy  Netta Corrigan, PT, DPT, CSRS 03/18/2019, 7:45 AM

## 2019-03-18 NOTE — Progress Notes (Signed)
Occupational Therapy Session Note  Patient Details  Name: Ann Lopez MRN: [id number] Date of Birth: Mar 02, 1971  Today's Date: 03/18/2019 OT Individual Time: 1400-1502 OT Individual Time Calculation (min): 62 min    Short Term Goals: Week 2:  OT Short Term Goal 1 (Week 2): Pt will complete LB bathing sit to stand with min assist. OT Short Term Goal 2 (Week 2): Pt will complete UB dressing with supervision in sitting OT Short Term Goal 3 (Week 2): Pt will use the LUE as a stablilizer with mod assist during bathing tasks. OT Short Term Goal 4 (Week 2): Pt will complete toilet transfer with min assist stand pivot to the 3:1 or elevated toilet.  Skilled Therapeutic Interventions/Progress Updates:    Patient seated in w/c, pleasant, cooperative and ready for therapy session.  Completed ambulation with RW (left hand grip & AFO) mod A w/c to toilet - mod cues for sequencing and walker placement.  toileting completed - required prolonged sitting on toilet due to constipation - min A for clothing management, she is able to complete hygiene with CS.  Ambulation toilet to bed with mod A.  Completed unsupported sitting PROM/AAROM and weight bearing activities, requires inhibition of proximal flexors and facilitation of extensors.  SPT to/from bed, w/c and toilet with min A for return to commode due to ongoing need for BM at end of session.  Son present and aide aware to assist patient back to w/c upon completion.    Therapy Documentation Precautions:  Precautions Precautions: Fall Precaution Comments: left hemiparesis Required Braces or Orthoses: Other Brace Other Brace: L AFO Restrictions Weight Bearing Restrictions: No General:   Vital Signs:  Pain: Pain Assessment Pain Scale: 0-10 Pain Score: 0-No pain Other Treatments:     Therapy/Group: Individual Therapy  Carlos Levering 03/18/2019, 3:05 PM

## 2019-03-18 NOTE — Progress Notes (Signed)
Social Work Patient ID: Ann Lopez, female   DOB: 12/17/1970, 48 y.o.   MRN: [id number]  Met with pt to discuss team conference progress toward her goals and discharge still 12/22. She is aware will need to begin training with the caregivers end of next week. Pt discouraged with her progress not as quick has she wanted. She feels she beat cancer and now is having to begin again with this and at times it is overwhelming. Neuro-psych seeing and will continue to see for coping while here. Will continue to work on discharge needs.  

## 2019-03-18 NOTE — Progress Notes (Signed)
Pt with therapy, had large BM that was hard, pt reported that feels like she didn't completely empty bowels. Notified PA, see new order.

## 2019-03-19 ENCOUNTER — Inpatient Hospital Stay (HOSPITAL_COMMUNITY): Payer: Medicare Other | Admitting: Occupational Therapy

## 2019-03-19 ENCOUNTER — Inpatient Hospital Stay (HOSPITAL_COMMUNITY): Payer: Medicare Other | Admitting: Physical Therapy

## 2019-03-19 LAB — GLUCOSE, CAPILLARY
Glucose-Capillary: 104 mg/dL — ABNORMAL HIGH (ref 70–99)
Glucose-Capillary: 116 mg/dL — ABNORMAL HIGH (ref 70–99)
Glucose-Capillary: 242 mg/dL — ABNORMAL HIGH (ref 70–99)
Glucose-Capillary: 91 mg/dL (ref 70–99)

## 2019-03-19 MED ORDER — TOPIRAMATE 25 MG PO TABS: 25.0000 mg | ORAL_TABLET | Freq: Every day | ORAL | Status: DC

## 2019-03-19 NOTE — Progress Notes (Signed)
Occupational Therapy Session Note  Patient Details  Name: [name] MRN: [id number] Date of Birth: 08-28-1970  Today's Date: 03/19/2019 OT Individual Time: [id number] OT Individual Time Calculation (min): 70 min    Short Term Goals: Week 2:  OT Short Term Goal 1 (Week 2): Pt will complete LB bathing sit to stand with min assist. OT Short Term Goal 2 (Week 2): Pt will complete UB dressing with supervision in sitting OT Short Term Goal 3 (Week 2): Pt will use the LUE as a stablilizer with mod assist during bathing tasks. OT Short Term Goal 4 (Week 2): Pt will complete toilet transfer with min assist stand pivot to the 3:1 or elevated toilet.  Skilled Therapeutic Interventions/Progress Updates:    Patient in bed, alert and ready for therapy session.  She is quiet today and states that she is tired.  Bed mobility with CS.  Sit to stand from bed with CGA.  Ambulation with RW (left hand assist and AFO) mod A bed to w/c (appox 10 feet) with assist for left foot placement and cues for sequencing.  Sit pivot transfer to/from w/c and mat table with min A.  Completed unsupported sitting trunk and proximal mobility activities, weight bearing in various positions with improved scapular positioning.  Utilized flat hand paddle for inhibition of flexor pattern and to provide surface for weight bearing.  Supine shoulder/scapula and elbow facilitation activities - noted intermittent proximal control but poor attention to task limits carryover.  Returned to seated position - patient noted that she had nausea - returned to w/c and eventually to bed with mod a, nursing aware of c/o nausea.  She remained in bed at end of session with bed alarm set and call bell in reach.    Therapy Documentation Precautions:  Precautions Precautions: Fall Precaution Comments: left hemiparesis Required Braces or Orthoses: Other Brace Other Brace: L AFO Restrictions Weight Bearing Restrictions: No General:   Vital  Signs: Therapy Vitals Temp: 97.8 F (36.6 C) Temp Source: Oral Pulse Rate: 84 Resp: 18 BP: 109/80 Patient Position (if appropriate): Lying Oxygen Therapy SpO2: 100 % Pain: Pain Assessment Pain Scale: 0-10 Pain Score: 0-No pain Other Treatments:     Therapy/Group: Individual Therapy  Carlos Levering 03/19/2019, 3:34 PM

## 2019-03-19 NOTE — Progress Notes (Signed)
Physical Therapy Session Note  Patient Details  Name: Ann Lopez MRN: [id number] Date of Birth: 28-May-1970  Today's Date: 03/19/2019 PT Individual Time: 0808-0905 PT Individual Time Calculation (min): 57 min   Short Term Goals: Week 2:  PT Short Term Goal 1 (Week 2): Pt will ambulate x50 ft with min assist and LRAD PT Short Term Goal 2 (Week 2): Pt will perform bed mobility CGA PT Short Term Goal 3 (Week 2): Pt will initiate stair training  Skilled Therapeutic Interventions/Progress Updates:   Pt received supine in bed and agreeable to therapy session. Reports she has been on/off awake since 11:30pm last night because she "has a lot on her mind" - therapist provided emotional support and encouragement about her progress while in CIR. Therapist donned B LE TEDs and shoes max assist - pt's L ankle noted to be resting in inversion while lying in the bed - provided passive stretch into DF and eversion prior to donning TEDs. Supine>sit, HOB elevated and using bedrails, with min assist for trunk upright. R squat pivot EOB>w/c with min assist for pivoting.  Transported to/from gym in w/c. L squat pivot w/c>EOM with min assist for pivoting hips and cuing with manual facilitation for L LE management. Repeated sit<>stands to/from EOM without UE support with mirror feedback targeting increased L LE weight bearing as well as improved timing and motor planning of L LE hip/knee extension - x15 reps with max multimodal cuing. Standing L LE NMR task targeting stance control with L hip/knee extension and L lateral weight shift while stepping R LE forward/backwards to external target, no UE support, and mod assist for balance - max multimodal cuing for sequencing and mirror feedback. Seated L LE long arc quads x10 reps, x5 reps with pt demonstrating extensor lag with significant increase time to raise leg - seated L LE heel slides x10 reps with assist to perform increased range with shoe resistance on floor. R  squat pivot EOM>w/c with min assist for balance and manual facilitation with cuing for proper L LE foot position. Donned L LE GRAFO max assist. Gait training ~67ft at R hallway rail with mod assist and +2 w/c follow - pt able to bring L LE through during swing with slight increased hip adduction and requires manual facilitation/assist to maintain knee stability to decrease knee buckling and hyperextension during stance - with increased R LE step length pt has more L LE knee hyperextension during stance. Transported back to room in w/c and pt left seated in w/c with needs in reach, seat belt alarm on, and L UE supported on w/c tray table.   Therapy Documentation Precautions:  Precautions Precautions: Fall Precaution Comments: left hemiparesis Required Braces or Orthoses: Other Brace Other Brace: L AFO Restrictions Weight Bearing Restrictions: No  Pain:  Reports low back pain - rated 5/10 - therapist provided seated rest breaks and notified RN at end of session for medication administration.    Therapy/Group: Individual Therapy  Tawana Scale, PT, DPT 03/19/2019, 7:51 AM

## 2019-03-19 NOTE — Evaluation (Signed)
Recreational Therapy Assessment and Plan  Patient Details  Name: Ann Lopez MRN: [id number] Date of Birth: July 19, 1970 Today's Date: 03/19/2019  Rehab Potential: Good ELOS: 12/22   Assessment Problem List:      Patient Active Problem List   Diagnosis Date Noted  . Acute ischemic right middle cerebral artery (MCA) stroke ([place]) 03/09/2019  . Left hemiparesis ([place])   . Bradycardia   . Tachypnea   . SIRS (systemic inflammatory response syndrome) ([facility])   . Hypokalemia   . Diabetes mellitus type 2 in obese ([place])   . Tobacco abuse   . History of breast cancer   . Cognitive deficits   . Dysphagia, post-stroke   . Stroke (cerebrum) ([place]) 03/02/2019  . Middle cerebral artery embolism, right 03/02/2019  . Chest pain 12/19/2015  . Numbness 12/19/2015  . Femoral hernia 03/24/2014  . Dyslipidemia 09/10/2011  . BRCA1 positive 09/10/2011  . Breast cancer ([place]) 09/10/2011  . H/O gastric bypass; 05/14/11([facility]) 09/10/2011  . Essential hypertension 10/28/2008  . DEPRESSION/ANXIETY 02/19/2007  . CARPAL TUNNEL SYNDROME, RIGHT 02/19/2007  . ALLERGIC RHINITIS 02/19/2007  . GERD 02/19/2007  . IRRITABLE BOWEL SYNDROME 02/19/2007  . HERPES GENITALIS 12/30/2006  . Diabetes ([place]) 12/30/2006  . OSTEOPENIA 12/30/2006  . ANOMALY, CONGENITAL, NERVOUS SYSTEM NEC 12/30/2006    Past Medical History:      Past Medical History:  Diagnosis Date  . Allergy   . Anxiety   . Asthma   . Back pain   . Breast cancer ([place])   . Cancer ([place])   . Depression   . Diabetes mellitus   . Headache   . Hyperlipemia   . Osteopenia   . Osteoporosis    osteopenia   Past Surgical History:       Past Surgical History:  Procedure Laterality Date  . ABDOMINAL HYSTERECTOMY     B oophorectomy for BRCA1 gene  . BREAST SURGERY    . CESAREAN SECTION    . INCISION AND DRAINAGE ABSCESS Left 11/14/2012   Procedure: INCISION AND DRAINAGE ABSCESS LEFT GROIN;  Surgeon: Jamesetta So, MD;  Location: [facility];  Service: General;  Laterality: Left;  . IR ANGIO VERTEBRAL SEL SUBCLAVIAN INNOMINATE UNI R MOD SED  03/02/2019  . IR CT HEAD LTD  03/02/2019  . IR INTRAVSC STENT CERV CAROTID W/O EMB-PROT MOD SED INC ANGIO  03/02/2019  . IR PERCUTANEOUS ART THROMBECTOMY/INFUSION INTRACRANIAL INC DIAG ANGIO  03/02/2019  . LYMPHADENECTOMY    . MASTECTOMY Bilateral   . RADIOLOGY WITH ANESTHESIA N/A 03/02/2019   Procedure: IR WITH ANESTHESIA;  Surgeon: Luanne Bras, MD;  Location: [facility];  Service: Radiology;  Laterality: N/A;    Assessment & Plan Clinical Impression: Patient is a 48 y.o. year old female with with history of T2DM, HTN, gastricsleeve, breast cancer s/p bilateral mastectomywith TAH/BSOand post chemo memory deficits, tobacco use, migraines, back pain who was admitted on 03/02/19 after fall from the commode and found to have left sided weakness with left neglect and right gaze preference. UDS positive with opioids and amphetamines. CTA/P head revealed near occlusive thrombus within R-ICA terminus extending into M1 R-MCA with A1 R-ACA with core infarct in R-MCA and large penumbra as well as incidental finding of left thyroid nodule. She was taken for cerebral angio with emergent thrombectomy and complete revascularization of occluded R- ICA terminus, R-MCA and R-ACA with stent assisted angioplasty of symptomatic proximal R-ICA stenosis by DR. Deveshwar. Post procedure with hypotension felt to be due to  carotid manipulation as well as decrease in LOC with increase in LLE tone. EEG showed diffuse encephalopathy and repeat CT head showed some mass effect on right lateral ventricle and was negative for hemorrhage.   She tolerated extubation without difficulty but had bouts of agitation alternating with lethargy. 2D echo showed EF 60-65% with no LVH and no wall abnormality. Most recent CT head 11/25 showed residual hyperdensity within infarcted right lentiform  nucleus c/w resolving confluent petechial hemorrhage and persistent partial effacement of right lateral ventricle. Dr. Erlinda Hong felt that stroke embolic due to large vessel stenosis but PAF not fully ruled out--30 day cardiac event monitor recommended on outpatient basis. Neurology recommending SBP goal 120-140--hypotensive today with SBP in 90's thereforetreated with fluid bolus.Patient with resultant left facial droop with cognitive deficits, left sided weakness and right gaze preference with left neglect affecting mobility and ADLs. CIR recommended due to functional decline.Patient transferred to CIR on 03/09/2019 .   Pt presents with decreased activity tolerance, decreased functional mobility, decreased balance, difficulty coping Limiting pt's independence with leisure/community pursuits.   Leisure History/Participation Premorbid leisure interest/current participation: [employer](anything with kids and grandkids) Other Leisure Interests: Television Leisure Participation Style: With Family/Friends Awareness of Community Resources: Good-identify 3 post discharge leisure resources Psychosocial / Spiritual Social interaction - Mood/Behavior: Cooperative Academic librarian Appropriate for Education?: Yes Recreational Therapy Orientation Orientation -Reviewed with patient: Available activity resources Strengths/Weaknesses Patient Strengths/Abilities: Willingness to participate;Active premorbidly Patient weaknesses: Physical limitations TR Patient demonstrates impairments in the following area(s): Endurance;Motor;Pain;Safety  Plan Rec Therapy Plan Is patient appropriate for Therapeutic Recreation?: Yes Rehab Potential: Good Treatment times per week: Min 1 TR session >20  minutes during LOS Estimated Length of Stay: 12/22 TR Treatment/Interventions: Adaptive equipment instruction;Functional mobility training;Recreation/leisure  participation;Wheelchair propulsion/positioning;1:1 session;Therapeutic activities;Balance/vestibular training;Patient/family education;UE/LE Coordination activities;Community reintegration Recommendations for other services: Neuropsych  Recommendations for other services: Neuropsych  Discharge Criteria: Patient will be discharged from TR if patient refuses treatment 3 consecutive times without medical reason.  If treatment goals not met, if there is a change in medical status, if patient makes no progress towards goals or if patient is discharged from hospital.  The above assessment, treatment plan, treatment alternatives and goals were discussed and mutually agreed upon: by patient  [place] 03/19/2019, 11:54 AM

## 2019-03-19 NOTE — Progress Notes (Signed)
[place] PHYSICAL MEDICINE & REHABILITATION PROGRESS NOTE   Subjective/Complaints:  No further HA, pt feels that HA medicine (topirimate) and possible hydrocodone are making her drowsy.  Tried not taking hydrocodone yesterday but became jittery at ~4pm and irritable.  Home dose Norco 10mg  QID, discussed that she has opioid dependance and will experience withdrawal symptoms if she abruptly discontinues   ROS:Neg CP, SOB, N/V/D   Objective:   No results found. No results for input(s): WBC, HGB, HCT, PLT in the last 72 hours. Recent Labs    03/17/19 0555  NA 136  K 3.6  CL 103  CO2 22  GLUCOSE 93  BUN 20  CREATININE 0.75  CALCIUM 9.2    Intake/Output Summary (Last 24 hours) at 03/19/2019 0905 Last data filed at 03/18/2019 1833 Gross per 24 hour  Intake 480 ml  Output -  Net 480 ml     Physical Exam: Vital Signs Blood pressure 95/75, pulse 78, temperature 97.9 F (36.6 C), temperature source Oral, resp. rate 16, height 5\' 3"  (1.6 m), weight 86.5 kg, SpO2 100 %.   General: Tearful, depressed. Mood and affect are appropriate Heart: Regular rate and rhythm no rubs murmurs or extra sounds Lungs: Clear to auscultation, breathing unlabored, no rales or wheezes Abdomen: Positive bowel sounds, soft nontender to palpation, nondistended Extremities: No clubbing, cyanosis, or edema Skin: No evidence of breakdown, no evidence of rash Neurologic: Cranial nerves II through XII intact, motor strength is 5/5 in right  deltoid, bicep, tricep, grip, hip flexor, knee extensors, ankle dorsiflexor and plantar flexor 0/5 in LUE and 3/5 LLE Sensory exam normal sensation to light touch and proprioception in bilateral upper and lower extremities Cerebellar exam normal finger to nose to finger as well as heel to shin in bilateral upper and lower extremities Musculoskeletal: Full range of motion in all 4 extremities. No joint swelling   Assessment/Plan: 1. Functional deficits secondary to  Right MCA infarct which require 3+ hours per day of interdisciplinary therapy in a comprehensive inpatient rehab setting.  Physiatrist is providing close team supervision and 24 hour management of active medical problems listed below.  Physiatrist and rehab team continue to assess barriers to discharge/monitor patient progress toward functional and medical goals  Care Tool:  Bathing    Body parts bathed by patient: Left arm, Chest, Abdomen, Right upper leg, Left upper leg, Right lower leg, Face, Front perineal area   Body parts bathed by helper: Buttocks, Abdomen, Right arm     Bathing assist Assist Level: Minimal Assistance - Patient > 75%     Upper Body Dressing/Undressing Upper body dressing   What is the patient wearing?: Pull over shirt    Upper body assist Assist Level: Minimal Assistance - Patient > 75%    Lower Body Dressing/Undressing Lower body dressing      What is the patient wearing?: Pants, Underwear/pull up     Lower body assist Assist for lower body dressing: Moderate Assistance - Patient 50 - 74%     Toileting Toileting    Toileting assist Assist for toileting: Minimal Assistance - Patient > 75%     Transfers Chair/bed transfer  Transfers assist     Chair/bed transfer assist level: Minimal Assistance - Patient > 75%     Locomotion Ambulation   Ambulation assist   Ambulation activity did not occur: Safety/medical concerns  Assist level: Moderate Assistance - Patient 50 - 74% Assistive device: Other (comment)(R rail) Max distance: 30 ft   Walk 10  feet activity   Assist     Assist level: Moderate Assistance - Patient - 50 - 74% Assistive device: Other (comment)(R rail)   Walk 50 feet activity   Assist Walk 50 feet with 2 turns activity did not occur: Safety/medical concerns         Walk 150 feet activity   Assist Walk 150 feet activity did not occur: Safety/medical concerns         Walk 10 feet on uneven surface   activity   Assist Walk 10 feet on uneven surfaces activity did not occur: Safety/medical concerns         Wheelchair     Assist Will patient use wheelchair at discharge?: (TBD) Type of Wheelchair: Manual    Wheelchair assist level: Minimal Assistance - Patient > 75% Max wheelchair distance: 150 ft    Wheelchair 50 feet with 2 turns activity    Assist        Assist Level: Minimal Assistance - Patient > 75%   Wheelchair 150 feet activity     Assist  Wheelchair 150 feet activity did not occur: Safety/medical concerns   Assist Level: Minimal Assistance - Patient > 75%   Blood pressure 95/75, pulse 78, temperature 97.9 F (36.6 C), temperature source Oral, resp. rate 16, height 5\' 3"  (1.6 m), weight 86.5 kg, SpO2 100 %.  Medical Problem List and Plan: 1.Left hemiparesis and functional deficitssecondary to right MCA infarct after right ICA,MCA, ACA occlusion. Pt s/p reperfusion/stenting---likely DM and smoking as main risk ractors  Cont CIR PT, OT SLP -  -[facility] 12/22              -left PRAFO/RWHO at HS 2. Antithrombotics: -DVT/anticoagulation:Pharmaceutical:Lovenoxadded 11/30. Will order dopplers for work up.  -antiplatelet therapy: On ASA/Brillita. 3.Chronic thoracic/ shoulder pain/Pain Management:On hydrocodone 10mg  QIDat home (went to pain clinic), currently on 5mg  q6h prn, does ok with this and would rec this t be d/c dose with f/u at her pain clinic  Discussed CVA pts more susceptible to side effects of depressant meds   HA, improved ,change  topiramate to qhs to see if drowsiness improves, also will see if HA recurs   4. Mood:LCSW to follow for evaluation and support. Will order sleep wake chart. -antipsychotic agents: N/A Hx of depression and anxiety - appreciate neuropsych, on Cymbalta and Klonopin 5. Neuropsych: This patientiscapable of making decisions on herown behalf. 6. Skin/Wound  Care:Routine pressure relief measures. 7. Fluids/Electrolytes/Nutrition:Monitor I/O-K+ normalized 8. R-ICAthrombus s/pstent: On Brillinta and ASA. Monitor CBC serially. Avoid hypotension. 9. Leucocytosis: Trending down with WBC 20-->10.8on 11/30. 10. Hypomagnesemia:Recheck levels in am. Likely needs multiple vitamins due to history of gastric bypass. 11. T2DM: Hgb A1C- 9.0. Was on lantus 40 units with meal coverage and invokana PTA.  CBG (last 3)  Recent Labs    03/18/19 1725 03/18/19 2102 03/19/19 0638  GLUCAP 111* 155* 104*  on Lantus 15U and Invokana controlled  12/6 12. Left thyroid nodule/Pulmonary nodules: Per CT neck 03/02/2019. Non-emergent thyroid ultrasound for follow up recommended.  13. COPD: On breo daily with albuterol prn.  14. Anxiety/depression: On Desvenlafaxine and clonazepam PTA?Now on Cymbalta 60 mg/day with Seroquel 12.5 mg am/25 mg HS---will discontinue Seroquel as has not been taking it consistently and continues to report issues with insomnia. Will resume Klonopin prn as at home andobserve. Keep sleep chart- 7.5 hr last noc     13. Bradycardia: HR's has been down to high 40's in the past 24 hours. Asymptomatic? Will monitor  for now.  14.No hx HTN, has historically run low BPs  Vitals:   03/18/19 1919 03/19/19 0503  BP: 108/73 95/75  Pulse: 73 78  Resp: 16 16  Temp: 97.9 F (36.6 C) 97.9 F (36.6 C)  SpO2: 94% 100%  controlled 12/10  15. Left breast cancer s/p bilateral mastectomy:  16.  Dysphagia related to CVA, monitor asp PNA, cont SLP 17.  Chronic constipation will add senna and prn dulc supp  18.  Insomnia- prn trazodone not taken, will schedule  LOS: 10 days A FACE TO FACE EVALUATION WAS PERFORMED  Charlett Blake 03/19/2019, 9:05 AM

## 2019-03-19 NOTE — Progress Notes (Signed)
Occupational Therapy Session Note  Patient Details  Name: Ann Lopez MRN: [id number] Date of Birth: Aug 14, 1970  Today's Date: 03/19/2019 OT Individual Time: 1006-1105 OT Individual Time Calculation (min): 59 min    Short Term Goals: Week 2:  OT Short Term Goal 1 (Week 2): Pt will complete LB bathing sit to stand with min assist. OT Short Term Goal 2 (Week 2): Pt will complete UB dressing with supervision in sitting OT Short Term Goal 3 (Week 2): Pt will use the LUE as a stablilizer with mod assist during bathing tasks. OT Short Term Goal 4 (Week 2): Pt will complete toilet transfer with min assist stand pivot to the 3:1 or elevated toilet.  Skilled Therapeutic Interventions/Progress Updates:    Pt completed shower and dressing during session.  Max assist for functional mobility from the wheelchair into the bathroom and over to the tub bench with therapist assist and AFO placement on the LLE.  She was able to complete doffing of LB clothing with max assist as well as pullover shirt with supervision.  She completed all bathing in sitting with lateral leans and min assist.  Increased LOB to the left when attempting to wash her buttocks with a lateral lean to the left side.  She continues to need mod instructional cueing to maintain sustained attention to the bathing task as she will get distracted with conversation or just internally distracted and continue to perseverate on rinsing herself off.  Mod instructional cueing needed to remember to rinse off the left side however.  She worked on dressing sit to stand at the sink.  Supervision for donning pullover with mod assist and min questioning cueing for donning underpants and pants following hemi technique.  Finished session with transfer to the bed to rest with mod assist stand pivot.  Pt left with call button and phone in reach with safety belt in place.    Therapy Documentation Precautions:  Precautions Precautions: Fall Precaution  Comments: left hemiparesis Required Braces or Orthoses: Other Brace Other Brace: L AFO Restrictions Weight Bearing Restrictions: No  Pain: Pain Assessment Pain Scale: Faces Pain Score: 0-No pain ADL: See Care Tool Section for some details of mobility and selfcare  Therapy/Group: Individual Therapy  Ashni Lonzo OTR/L 03/19/2019, 12:37 PM

## 2019-03-20 ENCOUNTER — Inpatient Hospital Stay (HOSPITAL_COMMUNITY): Payer: Medicare Other | Admitting: Physical Therapy

## 2019-03-20 ENCOUNTER — Inpatient Hospital Stay (HOSPITAL_COMMUNITY): Payer: Medicare Other | Admitting: Occupational Therapy

## 2019-03-20 LAB — CBC WITH DIFFERENTIAL/PLATELET
Abs Immature Granulocytes: 0.03 10*3/uL (ref 0.00–0.07)
Basophils Absolute: 0.1 10*3/uL (ref 0.0–0.1)
Basophils Relative: 1 %
Eosinophils Absolute: 0.1 10*3/uL (ref 0.0–0.5)
Eosinophils Relative: 1 %
HCT: 45.2 % (ref 36.0–46.0)
Hemoglobin: 15 g/dL (ref 12.0–15.0)
Immature Granulocytes: 0 %
Lymphocytes Relative: 25 %
Lymphs Abs: 2.4 10*3/uL (ref 0.7–4.0)
MCH: 29.3 pg (ref 26.0–34.0)
MCHC: 33.2 g/dL (ref 30.0–36.0)
MCV: 88.3 fL (ref 80.0–100.0)
Monocytes Absolute: 0.7 10*3/uL (ref 0.1–1.0)
Monocytes Relative: 8 %
Neutro Abs: 6.3 10*3/uL (ref 1.7–7.7)
Neutrophils Relative %: 65 %
Platelets: 319 10*3/uL (ref 150–400)
RBC: 5.12 MIL/uL — ABNORMAL HIGH (ref 3.87–5.11)
RDW: 13.3 % (ref 11.5–15.5)
WBC: 9.7 10*3/uL (ref 4.0–10.5)
nRBC: 0 % (ref 0.0–0.2)

## 2019-03-20 LAB — BASIC METABOLIC PANEL
Anion gap: 10 (ref 5–15)
BUN: 9 mg/dL (ref 6–20)
CO2: 22 mmol/L (ref 22–32)
Calcium: 9.1 mg/dL (ref 8.9–10.3)
Chloride: 105 mmol/L (ref 98–111)
Creatinine, Ser: 0.91 mg/dL (ref 0.44–1.00)
GFR calc Af Amer: >60 mL/min (ref >60)
GFR calc non Af Amer: >60 mL/min (ref >60)
Glucose, Bld: 141 mg/dL — ABNORMAL HIGH (ref 70–99)
Potassium: 3.2 mmol/L — ABNORMAL LOW (ref 3.5–5.1)
Sodium: 137 mmol/L (ref 135–145)

## 2019-03-20 LAB — GLUCOSE, CAPILLARY
Glucose-Capillary: 97 mg/dL (ref 70–99)
Glucose-Capillary: 101 mg/dL — ABNORMAL HIGH (ref 70–99)
Glucose-Capillary: 108 mg/dL — ABNORMAL HIGH (ref 70–99)
Glucose-Capillary: 102 mg/dL — ABNORMAL HIGH (ref 70–99)

## 2019-03-20 MED ORDER — TOPIRAMATE 25 MG PO TABS
25.0000 mg | ORAL_TABLET | Freq: Two times a day (BID) | ORAL | Status: DC
  Administered 2019-03-20 – 2019-03-31 (×23): 25 mg via ORAL
  Filled 2019-03-20 (×22): qty 1

## 2019-03-20 MED ORDER — POTASSIUM CHLORIDE CRYS ER 10 MEQ PO TBCR
10.0000 meq | EXTENDED_RELEASE_TABLET | Freq: Two times a day (BID) | ORAL | Status: DC
  Administered 2019-03-20 – 2019-03-31 (×22): 10 meq via ORAL
  Filled 2019-03-20 (×22): qty 1

## 2019-03-20 MED ORDER — ZOLPIDEM TARTRATE 5 MG PO TABS: 5.0000 mg | ORAL_TABLET | Freq: Every evening | ORAL | Status: DC | PRN

## 2019-03-20 MED ORDER — QUETIAPINE FUMARATE 25 MG PO TABS: 12.5000 mg | ORAL_TABLET | Freq: Every day | ORAL | Status: DC

## 2019-03-20 MED ORDER — SENNOSIDES-DOCUSATE SODIUM 8.6-50 MG PO TABS
2.0000 | ORAL_TABLET | Freq: Every day | ORAL | Status: DC
  Administered 2019-03-23 – 2019-03-31 (×9): 2 via ORAL
  Filled 2019-03-20 (×9): qty 2

## 2019-03-20 MED ORDER — SACCHAROMYCES BOULARDII 250 MG PO CAPS
250.0000 mg | ORAL_CAPSULE | Freq: Two times a day (BID) | ORAL | Status: DC
  Administered 2019-03-20 – 2019-03-31 (×22): 250 mg via ORAL
  Filled 2019-03-20 (×22): qty 1

## 2019-03-20 NOTE — Progress Notes (Signed)
Occupational Therapy Session Note  Patient Details  Name: NYRIAH SETHER MRN: [id number] Date of Birth: 1971/01/25  Today's Date: 03/20/2019 OT Individual Time: [id number] OT Individual Time Calculation (min): 61 min    Short Term Goals: Week 2:  OT Short Term Goal 1 (Week 2): Pt will complete LB bathing sit to stand with min assist. OT Short Term Goal 2 (Week 2): Pt will complete UB dressing with supervision in sitting OT Short Term Goal 3 (Week 2): Pt will use the LUE as a stablilizer with mod assist during bathing tasks. OT Short Term Goal 4 (Week 2): Pt will complete toilet transfer with min assist stand pivot to the 3:1 or elevated toilet.  Skilled Therapeutic Interventions/Progress Updates:    Pt in bed to start session, so worked on supine to sit EOB with mod instructional cueing to sequence and min assist.  She then donned her thigh high TEDs, shoes, and AFOs with max assist from therapist.  Mod assist for stand pivot transfer to the wheelchair with transport down to the therapy gym.  Had pt complete stand pivot transfer to the therapy mat with mod assist as well.  Worked on LUE neromuscular re-education with weightbearing tasks in sitting with functional reaching with the RUE, while supporting herself with the left.  Max facilitation needed for LUE elbow extension to stabilize when reaching for items.  Had her transition from sitting to squating as well with the LUE in weightbearing, while picking up blocks with the RUE and placing them into a container.  Next had her work on activation of the left shoulder external rotators and internal rotators with use of a therapy ball.  Increased strength noted in the internal rotators with trace in the external rotators.  Finished session with transfer back to the wheelchair and return to the room.  NMES applied to the left shoulder for subluxation as well as for facilitation of movement.  No adverse reactions noted to stimulation which lasted for one  hour.  See below for parameters.  Saebo Stim Go 330 pulse width 35 Hz pulse rate On 8 sec/ off 8 sec Ramp up/ down 2 sec Symmetrical Biphasic wave form  Max intensity 188mA at 500 Ohm load  Therapy Documentation Precautions:  Precautions Precautions: Fall Precaution Comments: left hemiparesis Required Braces or Orthoses: Other Brace Other Brace: L AFO Restrictions Weight Bearing Restrictions: No   Pain: Pain Assessment Pain Score: 0-No pain ADL: See Care Tool Section for some details of mobility and selfcare  Therapy/Group: Individual Therapy  Arlow Spiers OTR/L 03/20/2019, 12:12 PM

## 2019-03-20 NOTE — Progress Notes (Signed)
Physical Therapy Session Note  Patient Details  Name: Ann Lopez MRN: [id number] Date of Birth: June 18, 1970  Today's Date: 03/20/2019 PT Individual Time: [id number] PT Individual Time Calculation (min): 18 min   and  Today's Date: 03/20/2019 PT Missed Time: 57 Minutes Missed Time Reason: Patient ill (Comment)  Short Term Goals: Week 2:  PT Short Term Goal 1 (Week 2): Pt will ambulate x50 ft with min assist and LRAD PT Short Term Goal 2 (Week 2): Pt will perform bed mobility CGA PT Short Term Goal 3 (Week 2): Pt will initiate stair training  Skilled Therapeutic Interventions/Progress Updates:    Pt received sitting in w/c with NT present and pt reporting she just had an incontinent episode of runny diarrhea that "wiped her out" and she is now feeling exhausted. Patient appearing fatigued but agreeable to perform therapy session in her room to stay near bathroom. Performed seated L LE long arc quads and heel slides x15 reps each targeting improved muscle activation and motor control with cuing for increased ROM. Stand pivot w/c>EOB with min assist for balance and mod cuing for L LE attention and therapist blocking L knee hyperextension. Initiated standing mini-squats without UE support with min assist for balance and therapist providing manual facilitation and blocking L knee buckle/hyperextension with max cuing for increased attention to L LE for improved muscle activation and timing for quad muscle - during this task pt reports onset of lightheadedness. Provided seated rest break and water, which pt got choked on but able to clear with cuing and encouragement to continue coughing. Pt continues to appear very fatigued and reporting "not feeling well at all" and requesting to rest at this time. Assisted pt sit>supine with min assist and NT arriving to assess pt's vitals - WNL. Pt left R sidelying in bed with needs in reach and bed alarm on. Pam, PA notified of pt's diarrhea and pt not feeling  well.  Missed 57 minutes of skilled physical therapy.   Therapy Documentation Precautions:  Precautions Precautions: Fall Precaution Comments: left hemiparesis Required Braces or Orthoses: Other Brace Other Brace: L AFO Restrictions Weight Bearing Restrictions: No  Pain: Reports abdominal pain from diarrhea - RN aware.    Therapy/Group: Individual Therapy  Tawana Scale, PT, DPT 03/20/2019, 1:03 PM

## 2019-03-20 NOTE — Progress Notes (Signed)
Occupational Therapy Session Note  Patient Details  Name: Ann Lopez MRN: [id number] Date of Birth: [birth year]  Today's Date: 03/20/2019 OT Individual Time: 1100-1200 OT Individual Time Calculation (min): 60 min    Short Term Goals: Week 1:  OT Short Term Goal 1 (Week 1): Pt will complete UB dressing with min assist. OT Short Term Goal 1 - Progress (Week 1): Met OT Short Term Goal 2 (Week 1): Pt will complete LB bathing sit to stand with min assist. OT Short Term Goal 2 - Progress (Week 1): Not met OT Short Term Goal 3 (Week 1): Pt will complete LB dressing sit to stand. OT Short Term Goal 3 - Progress (Week 1): Met OT Short Term Goal 4 (Week 1): Pt will complete toilet transfers with mod assist stand pivot. OT Short Term Goal 4 - Progress (Week 1): Met Week 2:  OT Short Term Goal 1 (Week 2): Pt will complete LB bathing sit to stand with min assist. OT Short Term Goal 2 (Week 2): Pt will complete UB dressing with supervision in sitting OT Short Term Goal 3 (Week 2): Pt will use the LUE as a stablilizer with mod assist during bathing tasks. OT Short Term Goal 4 (Week 2): Pt will complete toilet transfer with min assist stand pivot to the 3:1 or elevated toilet. Week 3:     Skilled Therapeutic Interventions/Progress Updates:    NMR with focus on normal patterns of movement with transitional movements including stand pivot transfers, sitting to supine, and rolling. Pt required mod cues for incorporating functional movement in left UE.   Pt able to perform stand pivot transfers with AFO donned with min a with cues for sequencing and safety. Min A for transitioning into supine.  In supine worked on bridging with focus on symmetrical hip extension and maintaining left hip at midline (instead of abducting). Pt performed 20 with cues for attention to positioning of left LE. Also performed squeezing a ball between thighs for controlled adduction of left Le.   Performed PNF (D1/D2) in  supine with min A. Started with left UE abducted out to side. Pt able to reach across body (shoulder adduction) and then come into elbow extension and then back out to abduction. Pt able to elicit  elbow extension with repetition. In supine able to sustain elbow extension and scapula protracted with min A with UE lifted against gravity pointing to the ceiling.  Also focused on rolling to the right with functional movement and attention to left UE coming across the body. Pt initiates movement from pelvis with rolling - promoted initiation  with shoulder/ scapular and head turn.   Pt propelled the w/c back to her room without cushion in the chair to lower her seat to floor height for increased success and optimal position. Pt will need a hemi height w/c. Pt required mod cues to attend to the left environment to avoid running into objects.   Therapy Documentation Precautions:  Precautions Precautions: Fall Precaution Comments: left hemiparesis Required Braces or Orthoses: Other Brace Other Brace: L AFO Restrictions Weight Bearing Restrictions: No General: General PT Missed Treatment Reason: Patient ill (Comment) Vital Signs: Therapy Vitals Resp: 18 BP: 96/89 Patient Position (if appropriate): Lying Pain:  no c/o pain in session    Therapy/Group: Individual Therapy  Willeen Cass [facility] 03/20/2019, 3:09 PM

## 2019-03-20 NOTE — Progress Notes (Signed)
[place] PHYSICAL MEDICINE & REHABILITATION PROGRESS NOTE   Subjective/Complaints:  HA returned , topamax was reduced yesterday Poor sleep , no cough no bladder issues no pain. Tried trazodone No sleep graph completed last noc   ROS:Neg CP, SOB, N/V/D   Objective:   No results found. No results for input(s): WBC, HGB, HCT, PLT in the last 72 hours. No results for input(s): NA, K, CL, CO2, GLUCOSE, BUN, CREATININE, CALCIUM in the last 72 hours.  Intake/Output Summary (Last 24 hours) at 03/20/2019 0812 Last data filed at 03/19/2019 1900 Gross per 24 hour  Intake 120 ml  Output --  Net 120 ml     Physical Exam: Vital Signs Blood pressure 104/77, pulse 65, temperature 97.8 F (36.6 C), temperature source Oral, resp. rate 18, height 5\' 3"  (1.6 m), weight 86.5 kg, SpO2 99 %.   General: Tearful, depressed. Mood and affect are appropriate Heart: Regular rate and rhythm no rubs murmurs or extra sounds Lungs: Clear to auscultation, breathing unlabored, no rales or wheezes Abdomen: Positive bowel sounds, soft nontender to palpation, nondistended Extremities: No clubbing, cyanosis, or edema Skin: No evidence of breakdown, no evidence of rash Neurologic: Cranial nerves II through XII intact, motor strength is 5/5 in right  deltoid, bicep, tricep, grip, hip flexor, knee extensors, ankle dorsiflexor and plantar flexor 0/5 in LUE and 3/5 LLE Sensory exam normal sensation to light touch and proprioception in bilateral upper and lower extremities Cerebellar exam normal finger to nose to finger as well as heel to shin in bilateral upper and lower extremities Musculoskeletal: Full range of motion in all 4 extremities. No joint swelling   Assessment/Plan: 1. Functional deficits secondary to Right MCA infarct which require 3+ hours per day of interdisciplinary therapy in a comprehensive inpatient rehab setting.  Physiatrist is providing close team supervision and 24 hour management of  active medical problems listed below.  Physiatrist and rehab team continue to assess barriers to discharge/monitor patient progress toward functional and medical goals  Care Tool:  Bathing    Body parts bathed by patient: Left arm, Chest, Abdomen, Right upper leg, Left upper leg, Right lower leg, Face, Front perineal area, Buttocks(lateral lean for washing buttocks)   Body parts bathed by helper: Abdomen, Right arm     Bathing assist Assist Level: Minimal Assistance - Patient > 75%     Upper Body Dressing/Undressing Upper body dressing   What is the patient wearing?: Pull over shirt    Upper body assist Assist Level: Supervision/Verbal cueing    Lower Body Dressing/Undressing Lower body dressing      What is the patient wearing?: Pants, Underwear/pull up     Lower body assist Assist for lower body dressing: Moderate Assistance - Patient 50 - 74%     Toileting Toileting    Toileting assist Assist for toileting: Minimal Assistance - Patient > 75%     Transfers Chair/bed transfer  Transfers assist     Chair/bed transfer assist level: Minimal Assistance - Patient > 75%     Locomotion Ambulation   Ambulation assist   Ambulation activity did not occur: Safety/medical concerns  Assist level: 2 helpers Assistive device: Other (comment)(R rail) Max distance: 38ft   Walk 10 feet activity   Assist     Assist level: 2 helpers Assistive device: Other (comment)(R rail)   Walk 50 feet activity   Assist Walk 50 feet with 2 turns activity did not occur: Safety/medical concerns  Walk 150 feet activity   Assist Walk 150 feet activity did not occur: Safety/medical concerns         Walk 10 feet on uneven surface  activity   Assist Walk 10 feet on uneven surfaces activity did not occur: Safety/medical concerns         Wheelchair     Assist Will patient use wheelchair at discharge?: (TBD) Type of Wheelchair: Manual    Wheelchair  assist level: Minimal Assistance - Patient > 75% Max wheelchair distance: 150 ft    Wheelchair 50 feet with 2 turns activity    Assist        Assist Level: Minimal Assistance - Patient > 75%   Wheelchair 150 feet activity     Assist  Wheelchair 150 feet activity did not occur: Safety/medical concerns   Assist Level: Minimal Assistance - Patient > 75%   Blood pressure 104/77, pulse 65, temperature 97.8 F (36.6 C), temperature source Oral, resp. rate 18, height 5\' 3"  (1.6 m), weight 86.5 kg, SpO2 99 %.  Medical Problem List and Plan: 1.Left hemiparesis and functional deficitssecondary to right MCA infarct after right ICA,MCA, ACA occlusion. Pt s/p reperfusion/stenting---likely DM and smoking as main risk ractors  Cont CIR PT, OT SLP -  -[facility] 12/22              -left PRAFO/RWHO at HS 2. Antithrombotics: -DVT/anticoagulation:Pharmaceutical:Lovenoxadded 11/30. Will order dopplers for work up.  -antiplatelet therapy: On ASA/Brillita. 3.Chronic thoracic/ shoulder pain/Pain Management:On hydrocodone 10mg  QIDat home (went to pain clinic), currently on 5mg  q6h prn, does ok with this and would rec this  be d/c dose with f/u at her pain clinic  Discussed CVA pts more susceptible to side effects of depressant meds   HA, improved ,change  topiramate to qhs to see if drowsiness improves,  HA recurred so will increaase to BID    4. Mood:LCSW to follow for evaluation and support. Will order sleep wake chart. -antipsychotic agents: N/A Hx of depression and anxiety - appreciate neuropsych, on Cymbalta and Klonopin 5. Neuropsych: This patientiscapable of making decisions on herown behalf. 6. Skin/Wound Care:Routine pressure relief measures. 7. Fluids/Electrolytes/Nutrition:Monitor I/O-K+ normalized 8. R-ICAthrombus s/pstent: On Brillinta and ASA. Monitor CBC serially. Avoid hypotension. 9. Leucocytosis: Trending down with  WBC 20-->10.8on 11/30. 10. Hypomagnesemia:Recheck levels in am. Likely needs multiple vitamins due to history of gastric bypass. 11. T2DM: Hgb A1C- 9.0. Was on lantus 40 units with meal coverage and invokana PTA.  CBG (last 3)  Recent Labs    03/19/19 1716 03/19/19 2114 03/20/19 0626  GLUCAP 242* 91 97  on Lantus 15U and Invokana controlled  12/6 12. Left thyroid nodule/Pulmonary nodules: Per CT neck 03/02/2019. Non-emergent thyroid ultrasound for follow up recommended.  13. COPD: On breo daily with albuterol prn.  14. Anxiety/depression with related insomnia : On Desvenlafaxine and clonazepam PTA?Now on Cymbalta 60 mg/day was on seroquel will resume   D/C trazodone  13. Bradycardia: HR's has been down to high 40's in the past 24 hours. Asymptomatic? Will monitor for now.  14.No hx HTN, has historically run low BPs  Vitals:   03/19/19 2004 03/20/19 0438  BP: 103/65 104/77  Pulse: 78 65  Resp: 18 18  Temp: 97.8 F (36.6 C) 97.8 F (36.6 C)  SpO2: 100% 99%  controlled 12/11 15. Left breast cancer s/p bilateral mastectomy:  16.  Dysphagia related to CVA, monitor asp PNA, cont SLP 17.  Chronic constipation will add senna and prn dulc supp  LOS: 11 days A FACE TO FACE EVALUATION WAS PERFORMED  Charlett Blake 03/20/2019, 8:12 AM

## 2019-03-20 NOTE — Progress Notes (Signed)
Therapy reporting patient with large volume diarrhea today. Also note that she has large liquid sstool yesterday---she did get extra laxative a few days ago. Will hold colace and Senna S for a couple fo days and add probiotic and monitor for now.

## 2019-03-21 DIAGNOSIS — F5105 Insomnia due to other mental disorder: Secondary | ICD-10-CM

## 2019-03-21 DIAGNOSIS — F39 Unspecified mood [affective] disorder: Secondary | ICD-10-CM

## 2019-03-21 LAB — GLUCOSE, CAPILLARY
Glucose-Capillary: 106 mg/dL — ABNORMAL HIGH (ref 70–99)
Glucose-Capillary: 99 mg/dL (ref 70–99)
Glucose-Capillary: 152 mg/dL — ABNORMAL HIGH (ref 70–99)
Glucose-Capillary: 126 mg/dL — ABNORMAL HIGH (ref 70–99)

## 2019-03-21 MED ORDER — TRAZODONE HCL 50 MG PO TABS: 50.0000 mg | ORAL_TABLET | Freq: Every day | ORAL | Status: DC

## 2019-03-21 MED ORDER — TEMAZEPAM 7.5 MG PO CAPS
15.0000 mg | ORAL_CAPSULE | Freq: Every day | ORAL | Status: DC
  Administered 2019-03-21 – 2019-03-30 (×10): 15 mg via ORAL
  Filled 2019-03-21 (×10): qty 2

## 2019-03-21 NOTE — Progress Notes (Signed)
Refused scheduled seroquel, "It knocks me out." Awake since 0200. PRN klonopin given at 2250, per patient's request. Ate part of food brought from home. No stools this shift. Left resting hand splint and left PRAFO boot in place. Patrici Ranks A

## 2019-03-21 NOTE — Progress Notes (Signed)
[place] PHYSICAL MEDICINE & REHABILITATION PROGRESS NOTE   Subjective/Complaints:  Didn't sleep well because of bowels. Doesn't want seroquel because it's too strong. Says klonopin works ok although used it last night without a lot of beneift  ROS: Patient denies fever, rash, sore throat, blurred vision, nausea, vomiting, diarrhea, cough, shortness of breath or chest pain, joint or back pain, headache, or mood change.   Objective:   No results found. Recent Labs    03/20/19 1531  WBC 9.7  HGB 15.0  HCT 45.2  PLT 319   Recent Labs    03/20/19 1531  NA 137  K 3.2*  CL 105  CO2 22  GLUCOSE 141*  BUN 9  CREATININE 0.91  CALCIUM 9.1    Intake/Output Summary (Last 24 hours) at 03/21/2019 1221 Last data filed at 03/21/2019 0840 Gross per 24 hour  Intake 600 ml  Output --  Net 600 ml     Physical Exam: Vital Signs Blood pressure 94/61, pulse 68, temperature 98.3 F (36.8 C), resp. rate 18, height 5\' 3"  (1.6 m), weight 86.5 kg, SpO2 98 %.   Constitutional: No distress . Vital signs reviewed. HEENT: EOMI, oral membranes moist Neck: supple Cardiovascular: RRR without murmur. No JVD    Respiratory: CTA Bilaterally without wheezes or rales. Normal effort    GI: BS +, non-tender, non-distended  Extremities: No clubbing, cyanosis, or edema Skin: No evidence of breakdown, no evidence of rash Neurologic: Cranial nerves II through XII intact, motor strength is 5/5 in right  deltoid, bicep, tricep, grip, hip flexor, knee extensors, ankle dorsiflexor and plantar flexor 0/5 in LUE and 3/5 LLE--no chanbes Sensory exam normal sensation to light touch and proprioception in bilateral upper and lower extremities  Musculoskeletal: Full range of motion in all 4 extremities. No joint swelling Psych: anxious but pleasant  Assessment/Plan: 1. Functional deficits secondary to Right MCA infarct which require 3+ hours per day of interdisciplinary therapy in a comprehensive inpatient  rehab setting.  Physiatrist is providing close team supervision and 24 hour management of active medical problems listed below.  Physiatrist and rehab team continue to assess barriers to discharge/monitor patient progress toward functional and medical goals  Care Tool:  Bathing    Body parts bathed by patient: Left arm, Chest, Abdomen, Right upper leg, Left upper leg, Right lower leg, Face, Front perineal area, Buttocks(lateral lean for washing buttocks)   Body parts bathed by helper: Abdomen, Right arm     Bathing assist Assist Level: Minimal Assistance - Patient > 75%     Upper Body Dressing/Undressing Upper body dressing   What is the patient wearing?: Pull over shirt    Upper body assist Assist Level: Supervision/Verbal cueing    Lower Body Dressing/Undressing Lower body dressing      What is the patient wearing?: Pants, Underwear/pull up     Lower body assist Assist for lower body dressing: Moderate Assistance - Patient 50 - 74%     Toileting Toileting    Toileting assist Assist for toileting: Minimal Assistance - Patient > 75%     Transfers Chair/bed transfer  Transfers assist     Chair/bed transfer assist level: Minimal Assistance - Patient > 75%     Locomotion Ambulation   Ambulation assist   Ambulation activity did not occur: Safety/medical concerns  Assist level: 2 helpers Assistive device: Other (comment)(R rail) Max distance: 53ft   Walk 10 feet activity   Assist     Assist level: 2 helpers Assistive  device: Other (comment)(R rail)   Walk 50 feet activity   Assist Walk 50 feet with 2 turns activity did not occur: Safety/medical concerns         Walk 150 feet activity   Assist Walk 150 feet activity did not occur: Safety/medical concerns         Walk 10 feet on uneven surface  activity   Assist Walk 10 feet on uneven surfaces activity did not occur: Safety/medical concerns         Wheelchair     Assist  Will patient use wheelchair at discharge?: (TBD) Type of Wheelchair: Manual    Wheelchair assist level: Minimal Assistance - Patient > 75% Max wheelchair distance: 150 ft    Wheelchair 50 feet with 2 turns activity    Assist        Assist Level: Minimal Assistance - Patient > 75%   Wheelchair 150 feet activity     Assist  Wheelchair 150 feet activity did not occur: Safety/medical concerns   Assist Level: Minimal Assistance - Patient > 75%   Blood pressure 94/61, pulse 68, temperature 98.3 F (36.8 C), resp. rate 18, height 5\' 3"  (1.6 m), weight 86.5 kg, SpO2 98 %.  Medical Problem List and Plan: 1.Left hemiparesis and functional deficitssecondary to right MCA infarct after right ICA,MCA, ACA occlusion. Pt s/p reperfusion/stenting---likely DM and smoking as main risk ractors  Cont CIR PT, OT SLP -  -[facility] 12/22              -left PRAFO/RWHO at HS 2. Antithrombotics: -DVT/anticoagulation:Pharmaceutical:Lovenoxadded 11/30. Will order dopplers for work up.  -antiplatelet therapy: On ASA/Brillita. 3.Chronic thoracic/ shoulder pain/Pain Management:On hydrocodone 10mg  QIDat home (went to pain clinic), currently on 5mg  q6h prn, does ok with this and would rec this  be d/c dose with f/u at her pain clinic  -It has been discussed that CVA pts more susceptible to side effects of depressant meds   HA, improved ,change  topiramate to qhs to see if drowsiness improves,  HA recurred so  increased to BID    4. Mood:LCSW to follow for evaluation and support. Will order sleep wake chart. -antipsychotic agents: N/A  -Sleep:    Dc seroquel per pt request   -try restoril 15mg  which she has used at home     Hx of depression and anxiety - appreciate neuropsych, on Cymbalta and Klonopin 5. Neuropsych: This patientiscapable of making decisions on herown behalf. 6. Skin/Wound Care:Routine pressure relief measures. 7.  Fluids/Electrolytes/Nutrition:Monitor I/O-K+ normalized 8. R-ICAthrombus s/pstent: On Brillinta and ASA. Monitor CBC serially. Avoid hypotension. 9. Leucocytosis: Trending down with WBC 20-->10.8on 11/30. 10. Hypomagnesemia:Recheck levels in am. Likely needs multiple vitamins due to history of gastric bypass. 11. T2DM: Hgb A1C- 9.0. Was on lantus 40 units with meal coverage and invokana PTA.  CBG (last 3)  Recent Labs    03/20/19 2120 03/21/19 0626 03/21/19 1145  GLUCAP 102* 106* 99  on Lantus 15U and Invokana controlled  12/12 12. Left thyroid nodule/Pulmonary nodules: Per CT neck 03/02/2019. Non-emergent thyroid ultrasound for follow up recommended.  13. COPD: On breo daily with albuterol prn.  14. Anxiety/depression with related insomnia : On Desvenlafaxine and clonazepam PTA?Now on Cymbalta 60 mg/day dc'ed seroquel, trazodone already tried. On restoril 15mg  qhs prn at home, will try this   13. Bradycardia: HR's has been down to high 40's in the past 24 hours. Asymptomatic? Will monitor for now.  14.No hx HTN, has historically run low BPs  Vitals:   03/20/19 1947 03/21/19 0310  BP: 97/65 94/61  Pulse: 71 68  Resp: 16 18  Temp: (!) 97.4 F (36.3 C) 98.3 F (36.8 C)  SpO2: 97% 98%  controlled 12/12 15. Left breast cancer s/p bilateral mastectomy:  16.  Dysphagia related to CVA, monitor asp PNA, cont SLP 17.  Chronic constipation will add senna and prn dulc supp   LOS: 12 days A FACE TO FACE EVALUATION WAS PERFORMED  Meredith Staggers 03/21/2019, 12:21 PM

## 2019-03-22 ENCOUNTER — Inpatient Hospital Stay (HOSPITAL_COMMUNITY): Payer: Medicare Other

## 2019-03-22 LAB — GLUCOSE, CAPILLARY
Glucose-Capillary: 104 mg/dL — ABNORMAL HIGH (ref 70–99)
Glucose-Capillary: 99 mg/dL (ref 70–99)
Glucose-Capillary: 110 mg/dL — ABNORMAL HIGH (ref 70–99)
Glucose-Capillary: 131 mg/dL — ABNORMAL HIGH (ref 70–99)
Glucose-Capillary: 138 mg/dL — ABNORMAL HIGH (ref 70–99)

## 2019-03-22 NOTE — Progress Notes (Signed)
Physical Therapy Session Note  Patient Details  Name: TAMARIAH PEHL MRN: [id number] Date of Birth: 06/29/1970  Today's Date: 03/22/2019 PT Individual Time: 0901-1000 PT Individual Time Calculation (min): 59 min    Short Term Goals: Week 2:  PT Short Term Goal 1 (Week 2): Pt will ambulate x50 ft with min assist and LRAD PT Short Term Goal 2 (Week 2): Pt will perform bed mobility CGA PT Short Term Goal 3 (Week 2): Pt will initiate stair training  Skilled Therapeutic Interventions/Progress Updates:    Pt seated in recliner beginning to slide out of it, agreeable to therapy tx and denies pain. Therapist assisted pt to scoot hips back in recliner for safety min assist. Therapist donned teds, shoes and L AFO total assist. Pt performed stand pivot to w/c with mod assist and transported to the gym. Pt standing with RW worked on L UE NMR in order to try pushing walker forwards/backwards and side to side. Pt ambulated x 25 ft with RW and mod assist this session, turned to sit on the mat, during gait therapist providing cues for sequencing, min assist for steering RW, therapist blocking L hyperextension at the knee, facilitation for L hip extension during stance and assist for L foot placement during swing to limit adduction. Pt performed stand pivot with RW from mat>w/c with mod assist towards the L, pt unable to step out with L LE leading to LOB L into w/c. Pt transported to steps. Pt ascended/descended 4 steps with R rail and mod assist this session, therapist assisting with L knee control to block hyperextension, pt requiring assist for L foot placement to limit adduction. Pt performed stand pivot to the mat towards the R with RW and min assist, cues for tecniques, RW management and foot placement. Standing with RW pt performed blocked practice of lateral sidestepping in place with L LE 2 x 20 steps with facilitation/assist for foot placement, pt with hip abductor activation. Pt performed stand pivot to  the L with min-mod assist and RW, assisting with L foot placement and RW mangement. Pt transported back to room and left in w/c with needs in reach and chair alarm set.   Therapy Documentation Precautions:  Precautions Precautions: Fall Precaution Comments: left hemiparesis Required Braces or Orthoses: Other Brace Other Brace: L AFO Restrictions Weight Bearing Restrictions: No    Therapy/Group: Individual Therapy  Netta Corrigan, PT, DPT, CSRS 03/22/2019, 7:57 AM

## 2019-03-22 NOTE — Progress Notes (Signed)
[place] PHYSICAL MEDICINE & REHABILITATION PROGRESS NOTE   Subjective/Complaints:  Woke up at 0200 and couldn't go back to sleep. No new issues otherwise  ROS: Patient denies fever, rash, sore throat, blurred vision, nausea, vomiting, diarrhea, cough, shortness of breath or chest pain, joint or back pain, headache, or mood change.   Objective:   No results found. Recent Labs    03/20/19 1531  WBC 9.7  HGB 15.0  HCT 45.2  PLT 319   Recent Labs    03/20/19 1531  NA 137  K 3.2*  CL 105  CO2 22  GLUCOSE 141*  BUN 9  CREATININE 0.91  CALCIUM 9.1    Intake/Output Summary (Last 24 hours) at 03/22/2019 1057 Last data filed at 03/22/2019 0752 Gross per 24 hour  Intake 960 ml  Output --  Net 960 ml     Physical Exam: Vital Signs Blood pressure (!) 96/55, pulse 73, temperature 98.4 F (36.9 C), resp. rate 18, height 5\' 3"  (1.6 m), weight 86.5 kg, SpO2 100 %.   Constitutional: No distress . Vital signs reviewed. HEENT: EOMI, oral membranes moist Neck: supple Cardiovascular: RRR without murmur. No JVD    Respiratory: CTA Bilaterally without wheezes or rales. Normal effort    GI: BS +, non-tender, non-distended  Extremities: No clubbing, cyanosis, or edema Skin: No evidence of breakdown, no evidence of rash Neurologic: Cranial nerves II through XII intact, motor strength is 5/5 in right  deltoid, bicep, tricep, grip, hip flexor, knee extensors, ankle dorsiflexor and plantar flexor 0/5 in LUE and 3/5 LLE--stable exam Sensory exam normal sensation to light touch and proprioception in bilateral upper and lower extremities  Musculoskeletal: Full range of motion in all 4 extremities. No joint swelling Psych: pleasant  Assessment/Plan: 1. Functional deficits secondary to Right MCA infarct which require 3+ hours per day of interdisciplinary therapy in a comprehensive inpatient rehab setting.  Physiatrist is providing close team supervision and 24 hour management of  active medical problems listed below.  Physiatrist and rehab team continue to assess barriers to discharge/monitor patient progress toward functional and medical goals  Care Tool:  Bathing    Body parts bathed by patient: Left arm, Chest, Abdomen, Right upper leg, Left upper leg, Right lower leg, Face, Front perineal area, Buttocks(lateral lean for washing buttocks)   Body parts bathed by helper: Abdomen, Right arm     Bathing assist Assist Level: Minimal Assistance - Patient > 75%     Upper Body Dressing/Undressing Upper body dressing   What is the patient wearing?: Pull over shirt    Upper body assist Assist Level: Supervision/Verbal cueing    Lower Body Dressing/Undressing Lower body dressing      What is the patient wearing?: Pants, Underwear/pull up     Lower body assist Assist for lower body dressing: Moderate Assistance - Patient 50 - 74%     Toileting Toileting    Toileting assist Assist for toileting: Minimal Assistance - Patient > 75%     Transfers Chair/bed transfer  Transfers assist     Chair/bed transfer assist level: Minimal Assistance - Patient > 75%     Locomotion Ambulation   Ambulation assist   Ambulation activity did not occur: Safety/medical concerns  Assist level: 2 helpers Assistive device: Other (comment)(R rail) Max distance: 74ft   Walk 10 feet activity   Assist     Assist level: 2 helpers Assistive device: Other (comment)(R rail)   Walk 50 feet activity   Assist Walk  50 feet with 2 turns activity did not occur: Safety/medical concerns         Walk 150 feet activity   Assist Walk 150 feet activity did not occur: Safety/medical concerns         Walk 10 feet on uneven surface  activity   Assist Walk 10 feet on uneven surfaces activity did not occur: Safety/medical concerns         Wheelchair     Assist Will patient use wheelchair at discharge?: (TBD) Type of Wheelchair: Manual    Wheelchair  assist level: Minimal Assistance - Patient > 75% Max wheelchair distance: 150 ft    Wheelchair 50 feet with 2 turns activity    Assist        Assist Level: Minimal Assistance - Patient > 75%   Wheelchair 150 feet activity     Assist  Wheelchair 150 feet activity did not occur: Safety/medical concerns   Assist Level: Minimal Assistance - Patient > 75%   Blood pressure (!) 96/55, pulse 73, temperature 98.4 F (36.9 C), resp. rate 18, height 5\' 3"  (1.6 m), weight 86.5 kg, SpO2 100 %.  Medical Problem List and Plan: 1.Left hemiparesis and functional deficitssecondary to right MCA infarct after right ICA,MCA, ACA occlusion. Pt s/p reperfusion/stenting---likely DM and smoking as main risk ractors  Cont CIR PT, OT SLP -  -[facility] 12/22              -left PRAFO/RWHO at HS 2. Antithrombotics: -DVT/anticoagulation:Pharmaceutical:Lovenoxadded 11/30. Will order dopplers for work up.  -antiplatelet therapy: On ASA/Brillita. 3.Chronic thoracic/ shoulder pain/Pain Management:On hydrocodone 10mg  QIDat home (went to pain clinic), currently on 5mg  q6h prn, does ok with this and would rec this  be d/c dose with f/u at her pain clinic  -It has been discussed that CVA pts more susceptible to side effects of depressant meds   HA, improved ,change  topiramate to qhs to see if drowsiness improves,  HA recurred so  increased to BID    4. Mood:LCSW to follow for evaluation and support. Will order sleep wake chart. -antipsychotic agents: N/A  -Sleep:    Dc seroquel per pt request   -try restoril 15mg  which she has used at home     Hx of depression and anxiety - appreciate neuropsych, on Cymbalta and Klonopin 5. Neuropsych: This patientiscapable of making decisions on herown behalf. 6. Skin/Wound Care:Routine pressure relief measures. 7. Fluids/Electrolytes/Nutrition:Monitor I/O-K+ normalized 8. R-ICAthrombus s/pstent: On Brillinta  and ASA. Monitor CBC serially. Avoid hypotension. 9. Leucocytosis: Trending down with WBC 20-->10.8on 11/30. 10. Hypomagnesemia:Recheck levels in am. Likely needs multiple vitamins due to history of gastric bypass. 11. T2DM: Hgb A1C- 9.0. Was on lantus 40 units with meal coverage and invokana PTA.  CBG (last 3)  Recent Labs    03/21/19 2136 03/22/19 0634 03/22/19 0652  GLUCAP 126* 104* 99  on Lantus 15U and Invokana controlled  12/13 12. Left thyroid nodule/Pulmonary nodules: Per CT neck 03/02/2019. Non-emergent thyroid ultrasound for follow up recommended.  13. COPD: On breo daily with albuterol prn.  14. Anxiety/depression with related insomnia : On Desvenlafaxine and clonazepam PTA?Now on Cymbalta 60 mg/day dc'ed seroquel, trazodone already tried. On restoril 15mg  qhs prn at home--resumed schedule at [facility]-  -12/13 ? benefit with restoril last night   -this is a chronic problem unlikely to be solved while on rehab   13. Bradycardia: HR's has been down to high 40's in the past 24 hours. Asymptomatic? Will monitor for now.  14.No hx HTN, has historically run low BPs  Vitals:   03/21/19 2014 03/22/19 0626  BP: (!) 97/56 (!) 96/55  Pulse: 68 73  Resp: 18 18  Temp: 98.3 F (36.8 C) 98.4 F (36.9 C)  SpO2: 97% 100%  controlled 12/13 15. Left breast cancer s/p bilateral mastectomy:  16.  Dysphagia related to CVA, monitor asp PNA, cont SLP 17.  Chronic constipation will add senna and prn dulc supp   LOS: 13 days A FACE TO FACE EVALUATION WAS PERFORMED  Meredith Staggers 03/22/2019, 10:57 AM

## 2019-03-23 ENCOUNTER — Inpatient Hospital Stay (HOSPITAL_COMMUNITY): Payer: Medicare Other

## 2019-03-23 ENCOUNTER — Inpatient Hospital Stay (HOSPITAL_COMMUNITY): Payer: Medicare Other | Admitting: Occupational Therapy

## 2019-03-23 LAB — BASIC METABOLIC PANEL
Anion gap: 8 (ref 5–15)
BUN: 11 mg/dL (ref 6–20)
CO2: 23 mmol/L (ref 22–32)
Calcium: 9.3 mg/dL (ref 8.9–10.3)
Chloride: 108 mmol/L (ref 98–111)
Creatinine, Ser: 0.7 mg/dL (ref 0.44–1.00)
GFR calc Af Amer: >60 mL/min (ref >60)
GFR calc non Af Amer: >60 mL/min (ref >60)
Glucose, Bld: 101 mg/dL — ABNORMAL HIGH (ref 70–99)
Potassium: 4.1 mmol/L (ref 3.5–5.1)
Sodium: 139 mmol/L (ref 135–145)

## 2019-03-23 LAB — CBC
HCT: 47.7 % — ABNORMAL HIGH (ref 36.0–46.0)
Hemoglobin: 15.5 g/dL — ABNORMAL HIGH (ref 12.0–15.0)
MCH: 29.2 pg (ref 26.0–34.0)
MCHC: 32.5 g/dL (ref 30.0–36.0)
MCV: 90 fL (ref 80.0–100.0)
Platelets: 299 10*3/uL (ref 150–400)
RBC: 5.3 MIL/uL — ABNORMAL HIGH (ref 3.87–5.11)
RDW: 13.3 % (ref 11.5–15.5)
WBC: 6.9 10*3/uL (ref 4.0–10.5)
nRBC: 0 % (ref 0.0–0.2)

## 2019-03-23 LAB — GLUCOSE, CAPILLARY
Glucose-Capillary: 91 mg/dL (ref 70–99)
Glucose-Capillary: 84 mg/dL (ref 70–99)
Glucose-Capillary: 148 mg/dL — ABNORMAL HIGH (ref 70–99)
Glucose-Capillary: 157 mg/dL — ABNORMAL HIGH (ref 70–99)

## 2019-03-23 NOTE — Progress Notes (Signed)
Occupational Therapy Session Note  Patient Details  Name: Ann Lopez MRN: [id number] Date of Birth: 21-Dec-1970  Today's Date: 03/23/2019 OT Individual Time: [id number] OT Individual Time Calculation (min): 74 min    Short Term Goals: Week 2:  OT Short Term Goal 1 (Week 2): Pt will complete LB bathing sit to stand with min assist. OT Short Term Goal 2 (Week 2): Pt will complete UB dressing with supervision in sitting OT Short Term Goal 3 (Week 2): Pt will use the LUE as a stablilizer with mod assist during bathing tasks. OT Short Term Goal 4 (Week 2): Pt will complete toilet transfer with min assist stand pivot to the 3:1 or elevated toilet.  Skilled Therapeutic Interventions/Progress Updates:    Pt completed transfers from wheelchair to the therapy mat with mod assist stand pivot.  She also performed transfer squat pivot to the wheelchair from the therapy mat with min assist.  Had her work on LUE activation in supine and sitting during session.  Used an ace wrap to place the left hand on a vertical hand hold in order to work on AAROM shoulder flexion in supine position.  Noted some active shoulder flexion with increased internal rotation when attempting movement to 90 degrees.  In sitting she was able to push the tilted stool to a given target with min facilitation to avoid overuse of the trunk. Returned to the room at the end of the session in order for pt to eat lunch.  She remained sitting up in the wheelchair and NMES was applied to the left shoulder for treatment of subluxation.  She was able to tolerate for 60 mins without any adverse reactions.  Pt left with safety belt in place and call button in reach.  See below for settings of Saebo Go Stim.    Saebo Stim Go 330 pulse width 35 Hz pulse rate On 8 sec/ off 8 sec Ramp up/ down 2 sec Symmetrical Biphasic wave form  Max intensity 117mA at 500 Ohm load   Therapy Documentation Precautions:  Precautions Precautions:  Fall Precaution Comments: left hemiparesis Required Braces or Orthoses: Other Brace Other Brace: L AFO Restrictions Weight Bearing Restrictions: No  Pain: Pain Assessment Pain Scale: Faces Pain Score: 0-No pain ADL: See Care Tool Section for some details of mobility  Therapy/Group: Individual Therapy  Maxtyn Nuzum OTR/L 03/23/2019, 3:50 PM

## 2019-03-23 NOTE — Progress Notes (Signed)
Physical Therapy Session Note  Patient Details  Name: JOSLYNNE BALSER MRN: [id number] Date of Birth: [birth year]  Today's Date: 03/23/2019 PT Individual Time: 1501-1600 PT Individual Time Calculation (min): 59 min    Short Term Goals: Week 2:  PT Short Term Goal 1 (Week 2): Pt will ambulate x50 ft with min assist and LRAD PT Short Term Goal 2 (Week 2): Pt will perform bed mobility CGA PT Short Term Goal 3 (Week 2): Pt will initiate stair training  Skilled Therapeutic Interventions/Progress Updates:    Pt supine in bed upon PT arrival, agreeable to therapy tx and denies pain. Pt transferred to sitting EOB with supervision, therapist assisted to don shoes and AFO. Pt performed stand pivot to the w/c with min assist, cues for techniques and transported to the gym. Pt continued to work on gait training this session with use of RW and L AFO- x 25 ft, x 20 ft with RW and mod assist, facilitation for increased L hip extension during stance, assist/cues for L foot placement during swing, blocking L knee to limit hyperextension during L stance. Added heel wedge to L shoe to help limit hyperextension, pt ambulated x 20 ft and 32 ft with RW and mod assist, cues for foot placement and L attention, facilitation for L hip extension during stance. Pt performed blocked practice of stand pivot transfers with RW and min assist to the R, mod assist to the L, cues for attention to L during transfer to L and assist with L foot placement during transfer to L. Pt transported back to room at end of session, stand pivot to bed with min assist and cues for techniques.   Therapy Documentation Precautions:  Precautions Precautions: Fall Precaution Comments: left hemiparesis Required Braces or Orthoses: Other Brace Other Brace: L AFO Restrictions Weight Bearing Restrictions: No    Therapy/Group: Individual Therapy  Netta Corrigan, PT, DPT, CSRS 03/23/2019, 7:53 AM

## 2019-03-23 NOTE — Progress Notes (Signed)
Occupational Therapy Session Note  Patient Details  Name: Ann Lopez MRN: [id number] Date of Birth: 07-Mar-1971  Today's Date: 03/23/2019 OT Individual Time: [id number] OT Individual Time Calculation (min): 62 min    Short Term Goals: Week 2:  OT Short Term Goal 1 (Week 2): Pt will complete LB bathing sit to stand with min assist. OT Short Term Goal 2 (Week 2): Pt will complete UB dressing with supervision in sitting OT Short Term Goal 3 (Week 2): Pt will use the LUE as a stablilizer with mod assist during bathing tasks. OT Short Term Goal 4 (Week 2): Pt will complete toilet transfer with min assist stand pivot to the 3:1 or elevated toilet.  Skilled Therapeutic Interventions/Progress Updates:    Patient in bed, alert and ready for therapy session.  Sit to stand in stedy with CS.  Completed toileting and shower transfer via stedy to allow for time for adl/shower.  toileting with min A.   Bathing seated on shower bench with min A for right UE otherwise CS - she is able to maintain seated balance and is thorough with washing.  UB dressing with set up, LB dressing with min A for clothing management in stance.   Mod / max A for AFO and sneakers, she is able to donn socks with min A.  Grooming tasks completed w/c level with set up A.  She remained in the w/c at close of session, seat belt alarm set and call bell in reach.     Therapy Documentation Precautions:  Precautions Precautions: Fall Precaution Comments: left hemiparesis Required Braces or Orthoses: Other Brace Other Brace: L AFO Restrictions Weight Bearing Restrictions: No General:   Vital Signs:  Pain: Pain Assessment Pain Scale: 0-10 Pain Score: 0-No pain   Other Treatments:     Therapy/Group: Individual Therapy  Carlos Levering 03/23/2019, 12:23 PM

## 2019-03-23 NOTE — Progress Notes (Signed)
[place] PHYSICAL MEDICINE & REHABILITATION PROGRESS NOTE   Subjective/Complaints:  No issues overnite , slept well, no BM since "blow out on Friday" did not take any Senna S until this am   ROS: Patient denies , nausea, vomiting, diarrhea, cough, shortness of breath or chest pain, joint or back pain,  Objective:   No results found. Recent Labs    03/20/19 1531 03/23/19 0704  WBC 9.7 6.9  HGB 15.0 15.5*  HCT 45.2 47.7*  PLT 319 299   Recent Labs    03/20/19 1531 03/23/19 0704  NA 137 139  K 3.2* 4.1  CL 105 108  CO2 22 23  GLUCOSE 141* 101*  BUN 9 11  CREATININE 0.91 0.70  CALCIUM 9.1 9.3    Intake/Output Summary (Last 24 hours) at 03/23/2019 0841 Last data filed at 03/22/2019 1837 Gross per 24 hour  Intake 600 ml  Output --  Net 600 ml     Physical Exam: Vital Signs Blood pressure 108/77, pulse (!) 56, temperature 98 F (36.7 C), temperature source Oral, resp. rate 18, height 5\' 3"  (1.6 m), weight 86.5 kg, SpO2 92 %.   Constitutional: No distress . Vital signs reviewed. HEENT: EOMI, oral membranes moist Neck: supple Cardiovascular: RRR without murmur. No JVD    Respiratory: CTA Bilaterally without wheezes or rales. Normal effort    GI: BS +, non-tender, non-distended  Extremities: No clubbing, cyanosis, or edema Skin: No evidence of breakdown, no evidence of rash Neurologic: Cranial nerves II through XII intact, motor strength is 5/5 in right  deltoid, bicep, tricep, grip, hip flexor, knee extensors, ankle dorsiflexor and plantar flexor 0/5 in LUE and 3/5 LLE--stable exam Sensory exam normal sensation to light touch and proprioception in bilateral upper and lower extremities  Musculoskeletal: Full range of motion in all 4 extremities. No joint swelling Psych: pleasant  Assessment/Plan: 1. Functional deficits secondary to Right MCA infarct which require 3+ hours per day of interdisciplinary therapy in a comprehensive inpatient rehab  setting.  Physiatrist is providing close team supervision and 24 hour management of active medical problems listed below.  Physiatrist and rehab team continue to assess barriers to discharge/monitor patient progress toward functional and medical goals  Care Tool:  Bathing    Body parts bathed by patient: Left arm, Chest, Abdomen, Right upper leg, Left upper leg, Right lower leg, Face, Front perineal area, Buttocks(lateral lean for washing buttocks)   Body parts bathed by helper: Abdomen, Right arm     Bathing assist Assist Level: Minimal Assistance - Patient > 75%     Upper Body Dressing/Undressing Upper body dressing   What is the patient wearing?: Pull over shirt    Upper body assist Assist Level: Supervision/Verbal cueing    Lower Body Dressing/Undressing Lower body dressing      What is the patient wearing?: Pants, Underwear/pull up     Lower body assist Assist for lower body dressing: Moderate Assistance - Patient 50 - 74%     Toileting Toileting    Toileting assist Assist for toileting: Minimal Assistance - Patient > 75%     Transfers Chair/bed transfer  Transfers assist     Chair/bed transfer assist level: Minimal Assistance - Patient > 75%     Locomotion Ambulation   Ambulation assist   Ambulation activity did not occur: Safety/medical concerns  Assist level: Moderate Assistance - Patient 50 - 74% Assistive device: Walker-rolling Max distance: 25 ft   Walk 10 feet activity   Assist  Assist level: Moderate Assistance - Patient - 50 - 74% Assistive device: Walker-rolling   Walk 50 feet activity   Assist Walk 50 feet with 2 turns activity did not occur: Safety/medical concerns         Walk 150 feet activity   Assist Walk 150 feet activity did not occur: Safety/medical concerns         Walk 10 feet on uneven surface  activity   Assist Walk 10 feet on uneven surfaces activity did not occur: Safety/medical concerns          Wheelchair     Assist Will patient use wheelchair at discharge?: (TBD) Type of Wheelchair: Manual    Wheelchair assist level: Minimal Assistance - Patient > 75% Max wheelchair distance: 150 ft    Wheelchair 50 feet with 2 turns activity    Assist        Assist Level: Minimal Assistance - Patient > 75%   Wheelchair 150 feet activity     Assist  Wheelchair 150 feet activity did not occur: Safety/medical concerns   Assist Level: Minimal Assistance - Patient > 75%   Blood pressure 108/77, pulse (!) 56, temperature 98 F (36.7 C), temperature source Oral, resp. rate 18, height 5\' 3"  (1.6 m), weight 86.5 kg, SpO2 92 %.  Medical Problem List and Plan: 1.Left hemiparesis and functional deficitssecondary to right MCA infarct after right ICA,MCA, ACA occlusion. Pt s/p reperfusion/stenting---likely DM and smoking as main risk ractors  Cont CIR PT, OT SLP -  -[facility] 12/22              -left PRAFO/RWHO at HS 2. Antithrombotics: -DVT/anticoagulation:Pharmaceutical:Lovenoxadded 11/30. Will order dopplers for work up.  -antiplatelet therapy: On ASA/Brillita. 3.Chronic thoracic/ shoulder pain/Pain Management:On hydrocodone 10mg  QIDat home (went to pain clinic), currently on 5mg  q6h prn, does ok with this and would rec this  be d/c dose with f/u at her pain clinic  -It has been discussed that CVA pts more susceptible to side effects of depressant meds   HA,controlled but needs topiramate  BID    4. Mood:LCSW to follow for evaluation and support. Will order sleep wake chart. -antipsychotic agents: N/A  -Sleep:    Dc seroquel per pt request   -try restoril 15mg  which she has used at home     Hx of depression and anxiety - appreciate neuropsych, on Cymbalta and Klonopin 5. Neuropsych: This patientiscapable of making decisions on herown behalf. 6. Skin/Wound Care:Routine pressure relief measures. 7.  Fluids/Electrolytes/Nutrition:Monitor I/O-K+ normalized 8. R-ICAthrombus s/pstent: On Brillinta and ASA. Monitor CBC serially. Avoid hypotension. 9. Leucocytosis: Trending down with WBC 20-->10.8on 11/30. 10. Hypomagnesemia:Recheck levels in am. Likely needs multiple vitamins due to history of gastric bypass. 11. T2DM: Hgb A1C- 9.0. Was on lantus 40 units with meal coverage and invokana PTA.  CBG (last 3)  Recent Labs    03/22/19 1701 03/22/19 2107 03/23/19 0638  GLUCAP 131* 138* 91  on Lantus 15U and Invokana controlled  12/14 12. Left thyroid nodule/Pulmonary nodules: Per CT neck 03/02/2019. Non-emergent thyroid ultrasound for follow up recommended.  13. COPD: On breo daily with albuterol prn.  14. Anxiety/depression with related insomnia : On Desvenlafaxine and clonazepam PTA?Now on Cymbalta 60 mg/day   Improved with restoril last night   -sleep graph    13. Bradycardia: HR's has been down to high 40's in the past 24 hours. Asymptomatic? Will monitor for now.  14.No hx HTN, has historically run low BPs  Vitals:   03/22/19  1935 03/23/19 0424  BP: 91/66 108/77  Pulse: 72 (!) 56  Resp: 20 18  Temp: 98 F (36.7 C) 98 F (36.7 C)  SpO2: 97% 92%  controlled 12/14 15. Left breast cancer s/p bilateral mastectomy:  16.  Dysphagia related to CVA, monitor asp PNA, cont SLP 17.  Chronic constipation will add senna and prn dulc supp   LOS: 14 days A FACE TO FACE EVALUATION WAS PERFORMED  Charlett Blake 03/23/2019, 8:41 AM

## 2019-03-23 NOTE — Plan of Care (Signed)
  Problem: Consults Goal: RH STROKE PATIENT EDUCATION Description: See Patient Education module for education specifics  Outcome: Progressing Goal: Nutrition Consult-if indicated Outcome: Progressing Goal: Diabetes Guidelines if Diabetic/Glucose > 140 Description: If diabetic or lab glucose is > 140 mg/dl - Initiate Diabetes/Hyperglycemia Guidelines & Document Interventions  Outcome: Progressing   Problem: RH BOWEL ELIMINATION Goal: RH STG MANAGE BOWEL WITH ASSISTANCE Description: STG Manage Bowel with mod  I Assistance. Outcome: Progressing   Problem: RH BLADDER ELIMINATION Goal: RH STG MANAGE BLADDER WITH ASSISTANCE Description: STG Manage Bladder With mod  I Assistance Outcome: Progressing   Problem: RH SKIN INTEGRITY Goal: RH STG MAINTAIN SKIN INTEGRITY WITH ASSISTANCE Description: STG Maintain Skin Integrity With min Assistance. Outcome: Progressing   Problem: RH SAFETY Goal: RH STG ADHERE TO SAFETY PRECAUTIONS W/ASSISTANCE/DEVICE Description: STG Adhere to Safety Precautions With cues/reminders Assistance and appropriate assistive Device. Outcome: Progressing   Problem: RH PAIN MANAGEMENT Goal: RH STG PAIN MANAGED AT OR BELOW PT'S PAIN GOAL Description: <3 on a 0-10 pain scale Outcome: Progressing   Problem: RH KNOWLEDGE DEFICIT Goal: RH STG INCREASE KNOWLEDGE OF DIABETES Description: Patient will be able to demonstrate knowledge of diabetes medication, dietary restrictions, and follow up care with the MD post discharge with min assist from staff. Outcome: Progressing Goal: RH STG INCREASE KNOWLEDGE OF HYPERTENSION Description: Patient will be able to demonstrate knowledge of HTN medication, dietary restrictions, and follow up care with the MD post discharge with min assist from staff.  Outcome: Progressing Goal: RH STG INCREASE KNOWLEDGE OF STROKE PROPHYLAXIS Description: Patient Outcome: Progressing

## 2019-03-24 ENCOUNTER — Inpatient Hospital Stay (HOSPITAL_COMMUNITY): Payer: Medicare Other | Admitting: Physical Therapy

## 2019-03-24 ENCOUNTER — Inpatient Hospital Stay (HOSPITAL_COMMUNITY): Payer: Medicare Other | Admitting: Occupational Therapy

## 2019-03-24 LAB — GLUCOSE, CAPILLARY
Glucose-Capillary: 108 mg/dL — ABNORMAL HIGH (ref 70–99)
Glucose-Capillary: 108 mg/dL — ABNORMAL HIGH (ref 70–99)
Glucose-Capillary: 167 mg/dL — ABNORMAL HIGH (ref 70–99)
Glucose-Capillary: 138 mg/dL — ABNORMAL HIGH (ref 70–99)

## 2019-03-24 NOTE — Progress Notes (Signed)
Social Work Patient ID: Ann Lopez, female   DOB: [birth year], 48 y.o.   MRN: [id number]     Diagnosis codes:I63.511, E11.9 & Z85.3  Height:   5'4           Weight:   175 lbs         Patient suffers from R-CVA   which impairs her ability to perform daily activities like ADL's and tolieting   in the home.  A walker will not resolve issue with performing activities of daily living.  A wheelchair will allow patient to safely perform daily activities.  Patient is not able to propel themselves in the home using a standard weight wheelchair due to endurance and fatigue .  Patient can self propel in the lightweight wheelchair.

## 2019-03-24 NOTE — Progress Notes (Signed)
Occupational Therapy Session Note  Patient Details  Name: Ann Lopez MRN: [id number] Date of Birth: 1970-09-04  Today's Date: 03/24/2019 OT Individual Time: 0930-1030 OT Individual Time Calculation (min): 60 min    Skilled Therapeutic Interventions/Progress Updates:    1:1 Pt in bed when arrived. PT able to come to EOB on the right side with supervision with extra time. Focus on dressing on the EOB. Functional reach with mod A to obtain clothing on the left with left UE with cues for visual attention to the left. Focus on sit to stands without UE support and attention to left LE functional use without knee hyperextension. Pt able to dress UB and LB with hemi dressing techniques with min A for balance. A needed for donning shoes and AFO.   Transported to the gym and focus on sit to squat and controlled descents back into the chair with forearms on an elevated mat table. Performed functional reaching task in standing obtaining bean bags on the left with right UE with weight bearing through left UE when reaching on elevated mat surface . Cues needed to slow down to allow time for activation in left UE.   Pt able to self propel back to room in w/c (with w/c cushion removed for lower floor to seat height) with supervision with cues for turning to the right to avoid running into the wall. Transferred with min A into bed to rest.   Therapy Documentation Precautions:  Precautions Precautions: Fall Precaution Comments: left hemiparesis Required Braces or Orthoses: Other Brace Other Brace: L AFO Restrictions Weight Bearing Restrictions: No General:   Vital Signs:   Pain:   ADL: ADL Eating: Supervision/safety Where Assessed-Eating: Wheelchair Grooming: Minimal assistance Where Assessed-Grooming: Wheelchair Upper Body Bathing: Minimal assistance Where Assessed-Upper Body Bathing: Wheelchair Lower Body Bathing: Moderate assistance Where Assessed-Lower Body Bathing: Wheelchair Upper  Body Dressing: Moderate assistance Where Assessed-Upper Body Dressing: Wheelchair Lower Body Dressing: Maximal assistance Where Assessed-Lower Body Dressing: Wheelchair Toileting: Moderate assistance Where Assessed-Toileting: Bedside Commode Toilet Transfer: Maximal assistance Toilet Transfer Method: Stand pivot Toilet Transfer Equipment: Bedside commode Vision   Perception    Praxis   Exercises:   Other Treatments:     Therapy/Group: Individual Therapy  Willeen Cass [facility] 03/24/2019, 2:20 PM

## 2019-03-24 NOTE — Progress Notes (Signed)
Physical Therapy Session Note  Patient Details  Name: Ann Lopez MRN: [id number] Date of Birth: Aug 24, 1970  Today's Date: 03/24/2019 PT Individual Time: [id number] and [id number] PT Individual Time Calculation (min): 54 min and 58 min  Short Term Goals: Week 2:  PT Short Term Goal 1 (Week 2): Pt will ambulate x50 ft with min assist and LRAD PT Short Term Goal 2 (Week 2): Pt will perform bed mobility CGA PT Short Term Goal 3 (Week 2): Pt will initiate stair training  Skilled Therapeutic Interventions/Progress Updates:    Session 1: Pt received supine in bed and agreeable to therapy session. Supine>sit, HOB elevated and using bedrails with supervision and increased time - cuing for L hemibody attention. Sitting EOB with pt using R UE support as needed for trunk control, therapist donned B LE TEDs and shoes with L LE AFO max assist for time management. Stand pivot EOB>w/c using RW with min assist for balance and max cuing for proper L LE set-up and placement of L UE on/of RW orthotic - pt has to use R hand to place L hand on/off orthotic. Transported to/from gym in w/c for energy conservation. L stand pivot w/c>EOM using RW with min assist for balance and increased cuing required for proper L LE placement when turning towards L side. Standing with B UE support on RW performed L LE NMR task targeting swing phase of tapping heel to cone with min assist for balance and pt able to perform 3 sets of 3-5 reps to fatigue. Lateral sidestepping at [employer] using RW with min/mod assist for balance with min cuing for sequencing targeting increased L hip abduction/adduction control. Pt with significant fatigue this session requiring frequent seated rest breaks - assessed vitals: BP 108/90 (MAP 96), HR 92bpm. Performed standing with BUE support on RW progressed to no UE support L LE NMR task targeting stance control of stepping R LE forwards/backwards with mod assist for balance and mirror feedback with max multimodal  cuing for improved L LE hip/knee extension and L lateral weight shift onto stance limb as pt consistently demonstrates R lateral weight shift during static stance. R stand pivot EOM>w/c using RW with min assist for balance and again increased cuing for L hemibody attention. Transported back to room and pt requesting to return to bed and rest prior to next therapy session. Stand pivot w/c>EOB using RW with min assist. Therapist doffed B LE shoes and L AFO max assist. Sit>supine with min assist for L hemibody management and pt relying heavily on bedrail to reposition in the bed using R UE. Pt left supine in bed with needs in reach, L UE therapeutically positioned on pillow, and bed alarm on.   Of note: Patient continues to demonstrate significant compensation via use of R hemibody to perform functional mobility tasks.   Session 2: OT mentioned to PT that she noticed some bruising along pt's L anterior lower leg and foot. Pt received sitting in w/c and agreeable to therapy session. Pt reports that she believes she may have been experiencing a panic attack during earlier therapy session which is why she felt fatigued and was less talkative during the session because she felt the panic attack more strongly after the therapy session. Therapist provided emotional support and inquired of the cause and pt states she doesn't know why they happen but she has had them in the past - pt reports feeling confident with discharging home next week and no concerns with therapy. Thearpist doffed pt's L  LE AFO and also noted bruising along bilateral anterior lower legs and dorsal surface of L foot - pt reports the anterior leg bruising was present prior to wearing AFO and she believes it is associated with the stedy transfers - denies any pain.  Transported to/from gym in w/c for time management and energy conservation. Sitting in w/c<>tall kneeling on mat with UE support on bench with mod assist for balance and L LE management  on/off mat. While in tall kneeling with mirror feedback focused on midline orientation without UE support to improve L hip extension and L lateral weight shift with min/mod assist for balance progressed to repeated R hip abduction/adduction targeting L LE weight bearing and static L hip muscle activation without UE support with mod assist for balance and max multimodal cuing with manual facilitation for improved trunk and L hip extension. While in tall kneeling performed repeated hip flexion/extension with mod assist for balance and max multimodal cuing and manual facilitation for improved L lateral weight shift and L hip extension. With pt's L LE AFO doffed, therapist assessed pre-gait with B UE support on RW to perform R LE stepping forward/backwards with pt continuing to demonstrate L knee hyperextension with hip flexion but when therapist blocked hyperextension pt able to prevent knee buckle therefore left AFO doffed for gait training.  Gait training 91ft x 4 trials using RW with below details: 1st walk: without any bracing for L LE, pt demonstrated no ankle inversion rolling but required max cuing to ensure increased L hip abduction for proper foot placement prior to initiating stance as well as therapist blocking L knee hyperextension during stance and manually facilitating improved L lateral weight shift and hip extension onto stance limb - mod assist for balance 2nd-4th walks: therapist donned ACE warp for L ankle DF and eversion assist with pt demonstrating improved ankle positioning and improved foot clearance; however, demonstrates increased hip external rotation requiring increased cuing for proper placement; pt demonstrates improving B LE step length for reciprocal gait pattern, continues to require varying amounts of manual facilitation to prevent L knee hyperextension in stance while promoting increased L lateral weight shift and hip extension during stance - requires mod assist throughout for  balance and to prevent anterior LOB Pt reporting need to use bathroom. Transported back to room in w/c. Stand pivot w/c>BSC over toilet using grab bar with min assist for balance and max cuing for L LE stepping and placement. Standing without UE support and min assist for balance pt able to perform LB clothing management without assistance. Pt left sitting on BSC over toilet with NT present to assume care of patient.   Therapy Documentation Precautions:  Precautions Precautions: Fall Precaution Comments: left hemiparesis Required Braces or Orthoses: Other Brace Other Brace: L AFO Restrictions Weight Bearing Restrictions: No  Pain:   Session 1: Reports "just a little bit in my back" rated as 5/10 - pt defers intervention at this time.   Session 2: Continues to reports pain in low back - encouraged pt to request medications from RN as needed - provided seated rest breaks during session for pain management.   Therapy/Group: Individual Therapy  Tawana Scale, PT, DPT  03/24/2019, 1:37 PM

## 2019-03-24 NOTE — Plan of Care (Signed)
  Problem: Consults Goal: RH STROKE PATIENT EDUCATION Description: See Patient Education module for education specifics  Outcome: Progressing Goal: Nutrition Consult-if indicated Outcome: Progressing Goal: Diabetes Guidelines if Diabetic/Glucose > 140 Description: If diabetic or lab glucose is > 140 mg/dl - Initiate Diabetes/Hyperglycemia Guidelines & Document Interventions  Outcome: Progressing   Problem: RH BOWEL ELIMINATION Goal: RH STG MANAGE BOWEL WITH ASSISTANCE Description: STG Manage Bowel with mod  I Assistance. Outcome: Progressing   Problem: RH BLADDER ELIMINATION Goal: RH STG MANAGE BLADDER WITH ASSISTANCE Description: STG Manage Bladder With mod  I Assistance Outcome: Progressing   Problem: RH SKIN INTEGRITY Goal: RH STG MAINTAIN SKIN INTEGRITY WITH ASSISTANCE Description: STG Maintain Skin Integrity With min Assistance. Outcome: Progressing   Problem: RH SAFETY Goal: RH STG ADHERE TO SAFETY PRECAUTIONS W/ASSISTANCE/DEVICE Description: STG Adhere to Safety Precautions With cues/reminders Assistance and appropriate assistive Device. Outcome: Progressing   Problem: RH PAIN MANAGEMENT Goal: RH STG PAIN MANAGED AT OR BELOW PT'S PAIN GOAL Description: <3 on a 0-10 pain scale Outcome: Progressing   Problem: RH KNOWLEDGE DEFICIT Goal: RH STG INCREASE KNOWLEDGE OF DIABETES Description: Patient will be able to demonstrate knowledge of diabetes medication, dietary restrictions, and follow up care with the MD post discharge with min assist from staff. Outcome: Progressing Goal: RH STG INCREASE KNOWLEDGE OF HYPERTENSION Description: Patient will be able to demonstrate knowledge of HTN medication, dietary restrictions, and follow up care with the MD post discharge with min assist from staff.  Outcome: Progressing Goal: RH STG INCREASE KNOWLEDGE OF STROKE PROPHYLAXIS Description: Patient Outcome: Progressing

## 2019-03-24 NOTE — Progress Notes (Signed)
[facility] PHYSICAL MEDICINE & REHABILITATION PROGRESS NOTE   Subjective/Complaints:    ROS: Patient denies , nausea, vomiting, diarrhea, cough, shortness of breath or chest pain, joint or back pain,  Objective:   No results found. Recent Labs    03/23/19 0704  WBC 6.9  HGB 15.5*  HCT 47.7*  PLT 299   Recent Labs    03/23/19 0704  NA 139  K 4.1  CL 108  CO2 23  GLUCOSE 101*  BUN 11  CREATININE 0.70  CALCIUM 9.3    Intake/Output Summary (Last 24 hours) at 03/24/2019 [id number] Last data filed at 03/24/2019 0756 Gross per 24 hour  Intake 600 ml  Output --  Net 600 ml     Physical Exam: Vital Signs Blood pressure 99/61, pulse 72, temperature 98.3 F (36.8 C), temperature source Oral, resp. rate 18, height 5\' 3"  (1.6 m), weight 79.8 kg, SpO2 99 %.   Constitutional: No distress . Vital signs reviewed. HEENT: EOMI, oral membranes moist Neck: supple Cardiovascular: RRR without murmur. No JVD    Respiratory: CTA Bilaterally without wheezes or rales. Normal effort    GI: BS +, non-tender, non-distended  Extremities: No clubbing, cyanosis, or edema Skin: No evidence of breakdown, no evidence of rash Neurologic: Cranial nerves II through XII intact, motor strength is 5/5 in right  deltoid, bicep, tricep, grip, hip flexor, knee extensors, ankle dorsiflexor and plantar flexor 2- Left UE flexor synergy pattern Brunnstrom 3  and 3/5 LLE--stable exam Sensory exam normal sensation to light touch and proprioception in bilateral upper and lower extremities  Musculoskeletal: Full range of motion in all 4 extremities. No joint swelling Psych: pleasant  Assessment/Plan: 1. Functional deficits secondary to Right MCA infarct which require 3+ hours per day of interdisciplinary therapy in a comprehensive inpatient rehab setting.  Physiatrist is providing close team supervision and 24 hour management of active medical problems listed below.  Physiatrist and rehab team continue to  assess barriers to discharge/monitor patient progress toward functional and medical goals  Care Tool:  Bathing    Body parts bathed by patient: Left arm, Chest, Abdomen, Right upper leg, Left upper leg, Right lower leg, Face, Front perineal area, Buttocks, Left lower leg   Body parts bathed by helper: Right arm     Bathing assist Assist Level: Minimal Assistance - Patient > 75%     Upper Body Dressing/Undressing Upper body dressing   What is the patient wearing?: Pull over shirt    Upper body assist Assist Level: Set up assist    Lower Body Dressing/Undressing Lower body dressing      What is the patient wearing?: Pants, Underwear/pull up     Lower body assist Assist for lower body dressing: Minimal Assistance - Patient > 75%     Toileting Toileting    Toileting assist Assist for toileting: Minimal Assistance - Patient > 75%     Transfers Chair/bed transfer  Transfers assist     Chair/bed transfer assist level: Minimal Assistance - Patient > 75%     Locomotion Ambulation   Ambulation assist   Ambulation activity did not occur: Safety/medical concerns  Assist level: Moderate Assistance - Patient 50 - 74% Assistive device: Walker-rolling Max distance: 32 ft   Walk 10 feet activity   Assist     Assist level: Moderate Assistance - Patient - 50 - 74% Assistive device: Walker-rolling, Orthosis   Walk 50 feet activity   Assist Walk 50 feet with 2 turns activity did not  occur: Safety/medical concerns         Walk 150 feet activity   Assist Walk 150 feet activity did not occur: Safety/medical concerns         Walk 10 feet on uneven surface  activity   Assist Walk 10 feet on uneven surfaces activity did not occur: Safety/medical concerns         Wheelchair     Assist Will patient use wheelchair at discharge?: (TBD) Type of Wheelchair: Manual    Wheelchair assist level: Minimal Assistance - Patient > 75% Max wheelchair  distance: 150 ft    Wheelchair 50 feet with 2 turns activity    Assist        Assist Level: Minimal Assistance - Patient > 75%   Wheelchair 150 feet activity     Assist  Wheelchair 150 feet activity did not occur: Safety/medical concerns   Assist Level: Minimal Assistance - Patient > 75%   Blood pressure 99/61, pulse 72, temperature 98.3 F (36.8 C), temperature source Oral, resp. rate 18, height 5\' 3"  (1.6 m), weight 79.8 kg, SpO2 99 %.  Medical Problem List and Plan: 1.Left hemiparesis and functional deficitssecondary to right MCA infarct after right ICA,MCA, ACA occlusion. Pt s/p reperfusion/stenting---likely DM and smoking as main risk ractors  Cont CIR PT, OT SLP -team conf in am   -ELOS 12/22              -left PRAFO/RWHO at HS 2. Antithrombotics: -DVT/anticoagulation:Pharmaceutical:Lovenoxadded 11/30. Will order dopplers for work up.  -antiplatelet therapy: On ASA/Brillita. 3.Chronic thoracic/ shoulder pain/Pain Management:On hydrocodone 10mg  QIDat home (went to pain clinic), currently on 5mg  q6h prn, does ok with this and would rec this  be d/c dose with f/u at her pain clinic  -It has been discussed that CVA pts more susceptible to side effects of depressant meds   HA,controlled but needs topiramate  BID    4. Mood:LCSW to follow for evaluation and support. Will order sleep wake chart. -antipsychotic agents: N/A  -Sleep:    Dc seroquel per pt request   -try restoril 15mg  which she has used at home     Hx of depression and anxiety - appreciate neuropsych, on Cymbalta and Klonopin 5. Neuropsych: This patientiscapable of making decisions on herown behalf. 6. Skin/Wound Care:Routine pressure relief measures. 7. Fluids/Electrolytes/Nutrition:Monitor I/O-K+ normalized 8. R-ICAthrombus s/pstent: On Brillinta and ASA. Monitor CBC serially. Avoid hypotension. 9. Leucocytosis: Trending down with WBC  20-->10.8on 11/30. 10. Hypomagnesemia:Recheck levels in am. Likely needs multiple vitamins due to history of gastric bypass. 11. T2DM: Hgb A1C- 9.0. Was on lantus 40 units with meal coverage and invokana PTA.  CBG (last 3)  Recent Labs    03/23/19 1643 03/23/19 2201 03/24/19 0655  GLUCAP 148* 157* 108*  on Lantus 15U and Invokana controlled  12/15 12. Left thyroid nodule/Pulmonary nodules: Per CT neck 03/02/2019. Non-emergent thyroid ultrasound for follow up recommended.  13. COPD: On breo daily with albuterol prn.  14. Anxiety/depression with related insomnia : On Desvenlafaxine and clonazepam PTA?Now on Cymbalta 60 mg/day   Improved with restoril last night   -sleep graph    13. Bradycardia: HR's has been down to high 40's in the past 24 hours. Asymptomatic? Will monitor for now.  14.No hx HTN, has historically run low BPs  Vitals:   03/24/19 0615 03/24/19 0617  BP: (!) 88/56 99/61  Pulse: 75 72  Resp: 18 18  Temp: 98.3 F (36.8 C) 98.3 F (36.8 C)  SpO2:  99% 99%  controlled 12/15, normal BP for this pt ~90/60 15. Left breast cancer s/p bilateral mastectomy:  16.  Dysphagia related to CVA, monitor asp PNA, cont SLP 17.  Chronic constipation will add senna and prn dulc supp   LOS: 15 days A FACE TO FACE EVALUATION WAS PERFORMED  Charlett Blake 03/24/2019, 8:12 AM

## 2019-03-25 ENCOUNTER — Inpatient Hospital Stay (HOSPITAL_COMMUNITY): Payer: Medicare Other | Admitting: Occupational Therapy

## 2019-03-25 ENCOUNTER — Encounter (HOSPITAL_COMMUNITY): Payer: Medicare Other | Admitting: Psychology

## 2019-03-25 ENCOUNTER — Inpatient Hospital Stay (HOSPITAL_COMMUNITY): Payer: Medicare Other | Admitting: Physical Therapy

## 2019-03-25 DIAGNOSIS — F4001 Agoraphobia with panic disorder: Secondary | ICD-10-CM

## 2019-03-25 LAB — GLUCOSE, CAPILLARY
Glucose-Capillary: 119 mg/dL — ABNORMAL HIGH (ref 70–99)
Glucose-Capillary: 109 mg/dL — ABNORMAL HIGH (ref 70–99)
Glucose-Capillary: 125 mg/dL — ABNORMAL HIGH (ref 70–99)
Glucose-Capillary: 159 mg/dL — ABNORMAL HIGH (ref 70–99)

## 2019-03-25 NOTE — Progress Notes (Signed)
Occupational Therapy Weekly Progress Note  Patient Details  Name: Ann Lopez MRN: [id number] Date of Birth: Jan 06, 1971  Beginning of progress report period: March 17, 2019 End of progress report period: March 25, 2019  Today's Date: 03/25/2019 OT Individual Time: 4098-1191 OT Individual Time Calculation (min): 77 min    Patient has met 3 of 4 short term goals.  Ann Lopez is continuing to make steady progress with OT at this time.  She is able to complete UB bathing with min assist overall and UB dressing with supervision to donn a pullover shirt.  She is able to complete LB bathing in sitting with lateral leans and min guard assist.  LB dressing is at a min assist level for underclothes, pants, and socks, but she needs mod assist for donning her shoes and L AFO.  LUE function is currently at a Brunnstrum stage III level in the arm and stage II in the hand.  Slight flexor tone is noted in the digits with no active movement noted to command.  She is able to demonstrate some active synergy pattern developing with elbow flexion as well as shoulder internal rotation and flexion.  Her selective attention continues to be affected, requiring min to mod instructional cueing at times to recall hemi dressing techniques as she will get distracted with conversation.  She also demonstrates decreased anticipatory awareness of her limitations functionally with regards to not being able to mobilize initially at home secondary to this not being safe.  Feel she is on target to make consistent min assist level goals for discharge.  Therapist will update goals to reflect this as she will not quite be min guard assist for all tasks consistently at discharge which is expected 12/22. Will continue with current OT POC and provide family education PRN in preparation.     Patient continues to demonstrate the following deficits: muscle weakness, impaired timing and sequencing, abnormal tone, unbalanced muscle activation  and decreased coordination, decreased attention to left, decreased attention, decreased awareness, decreased problem solving and decreased safety awareness and decreased sitting balance, decreased standing balance, decreased postural control, hemiplegia and decreased balance strategies and therefore will continue to benefit from skilled OT intervention to enhance overall performance with BADL, iADL and Reduce care partner burden.  Patient not progressing toward long term goals.  See goal revision..  Continue plan of care.  OT Short Term Goals Week 3:  OT Short Term Goal 1 (Week 3): Pt will continue working toward LTGs set a min assist or less.  Skilled Therapeutic Interventions/Progress Updates:    Pt completed functional mobility to the bathroom with use of the RW and the AFO on the LLE at min to mod assist level.  She completed toileting with min assist sit to stand and then transferred over to the tub bench with mod assist and no device used for the 3-4 steps.  She completed all bathing with min assist in sitting with lateral leans side to side.  She needed max hand over hand assist for integration of the LUE for washing the right arm.  Next, she was able to transfer stand pivot to the wheelchair with mod assist and then work on dressing at the sink.  Supervision for pullover gown in sitting with mod assist for application of deodorant and supervision for brushing her hair.  She was able to complete LB dressing with min assist for donning underpants, pants, and socks using one handed technique.  She needed min assist for donning the  right shoe and max assist for the left with the AFO.  Took her down via wheelchair to the tub/shower room where she was able to complete tub/shower transfer with use of the tub bench and overall min assist squat pivot.  Returned to the room at the end of the session with transfer back to the bed and call button and phone in reach.  Therapist applied NMES to the left shoulder  at end of session.  She was able to tolerate stimulation for one hr at the below settings without any discomfort or adverse reactions.    [place] Stim Go 330 pulse width 35 Hz pulse rate On 8 sec/ off 8 sec Ramp up/ down 2 sec Symmetrical Biphasic wave form  Max intensity 167m at 500 Ohm load  Therapy Documentation Precautions:  Precautions Precautions: Fall Precaution Comments: left hemiparesis Required Braces or Orthoses: Other Brace Other Brace: L AFO Restrictions Weight Bearing Restrictions: No   Pain: Pain Assessment Pain Scale: Faces Pain Score: 0-No pain Faces Pain Scale: No hurt ADL: See Care Tool Section for some details of mobility and selfcare tasks.  Therapy/Group: Individual Therapy  Ashlee Bewley OTR/L 03/25/2019, 4:18 PM

## 2019-03-25 NOTE — Progress Notes (Signed)
Physical Therapy Weekly Progress Note  Patient Details  Name: Ann Lopez MRN: [id number] Date of Birth: [birth year]  Beginning of progress report period: March 18, 2019 End of progress report period: March 25, 2019  Today's Date: 03/25/2019 PT Individual Time: 1093-2355 and 1135-1209 PT Individual Time Calculation (min): 72 min and 34 min  Patient has met 2 of 3 short term goals.  Ms Ann Lopez is progressing well with therapy demonstrating improving L LE strength, motor control, and motor coordination as well as improving trunk control. She is performing bed mobility with min assist, sit<>stand and stand pivot transfers using RW with primarily min assist, gait training up to 174f using RW with varying min/mod assist for balance, and 4 stair navigation using handrail with mod assist. Patient continues to demonstrate impaired L hemibody attention, poor awareness of deficits, poor proprioceptive awareness of L hemibody as well as poor awareness of LOB in standing.  Patient continues to demonstrate the following deficits muscle weakness, muscle joint tightness and muscle paralysis, decreased cardiorespiratoy endurance, impaired timing and sequencing, abnormal tone, unbalanced muscle activation, decreased coordination and decreased motor planning, decreased attention to left and decreased sitting balance, decreased standing balance, decreased postural control and decreased balance strategies and therefore will continue to benefit from skilled PT intervention to increase functional independence with mobility.  Patient progressing toward long term goals. Continue plan of care.  PT Short Term Goals Week 2:  PT Short Term Goal 1 (Week 2): Pt will ambulate x50 ft with min assist and LRAD PT Short Term Goal 1 - Progress (Week 2): Progressing toward goal PT Short Term Goal 2 (Week 2): Pt will perform bed mobility CGA PT Short Term Goal 2 - Progress (Week 2): Met PT Short Term Goal 3 (Week 2): Pt  will initiate stair training PT Short Term Goal 3 - Progress (Week 2): Met Week 3:  PT Short Term Goal 1 (Week 3): = to LTGs based on ELOS  Skilled Therapeutic Interventions/Progress Updates:  Ambulation/gait training;Community reintegration;DME/adaptive equipment instruction;Neuromuscular re-education;Psychosocial support;Stair training;UE/LE Strength taining/ROM;Wheelchair propulsion/positioning;Balance/vestibular training;Discharge planning;Functional electrical stimulation;Pain management;Skin care/wound management;Therapeutic Activities;UE/LE Coordination activities;Cognitive remediation/compensation;Disease management/prevention;Functional mobility training;Patient/family education;Splinting/orthotics;Visual/perceptual remediation/compensation;Therapeutic Exercise  Session 1: Pt received supine in bed reporting she had multiple panic attacks last night and a couple this morning but is agreeable to therapy session - therapist provided emotional support but pt unable to report cause/source of these panic attacks. Supine>sit, HOB slightly elevated and encouragement not to use bedrails to practice home set-up, with min assist for trunk upright and significantly increased time. Sitting EOB using R UE for trunk support as needed donned shoes max assist for time management. R stand pivot EOB>w/c using RW with min assist for balance and again max cuing for L LE management/stepping and sequencing of AD.  Transported to/from gym in w/c for time management and energy conservation. Therapist provided pt with 18x18 w/c as this is the recommended DME at D/C. L stand pivot w/c>w/c using RW with min assist for balance and again max cuing for L LE stepping/placement and AD management - continued cuing for L hand placement on/off RW orthotic. Transported to day room. Pt suddenly becoming emotional and crying stating she is ready to go home - therapist provided emotional support and encouragement on her progress and pt  verbalized her goal is to continue walking so she can do this at home. Therapist donned ACE wrap for ankle DF and eversion assist. Gait ~68fusing RW with mod assist for balance to prevent L  anterior LOB - therapist providing manual facilitation for improved L lateral weight shift and hip extension during stance while blocking knee hyperextension (only occurred ~3 times during gait with pt now sustaining slight L knee flexion during stance); continues to demonstrate L foot externally rotated during stance with max multimodal cuing for correction as well as slight increased L hip adduction during swing. Performed dynamic standing balance task with L UE support on RW orthotic and mod assist for balance while performing R UE cross body reaching task targeting sustained L LE hip/knee extension during L lateral weight shift to grasp and place horseshoes - max multimodal cuing for step-by-step sequencing of task with increased attention to L LE management and preventing L lateral trunk LOB with mirror feedback. Gait training ~31f using RW with mod assist for balance due to continued anterior lean - pt continues to demonstrate above gait impairments with continued cuing and manual facilitation for improvement. Returned to room in w/c and pt requesting to rest at this time. R squat pivot w/c>EOB with light min assist for balance. Doffed tennis shoes max assist. Sit>supine with min assist for L LE management. Supine repositioning in the bed with therapist manually placing L LE in position for pt to perform bridge and pt demonstrating adequate hip clearance. Pt left supine in bed with L UE therapeutically positioned on pillow, needs in reach, and bed alarm on.   Session 2: Pt received L sidelying in bed on her L arm showing lack of awareness of its position and lack of safety awareness to ensure it is properly positioned - therapist providing education on this. Pt reports that she has been crying since the AM PT session   because she is ready to go home - therapist provided emotional support and encouragement regarding her progress. Therapist educated her on POC and ELOS as well as reinforced her goals prior to discharge. Pt agreeable to therapy session. Supine>sit, HOB slightly elevated and pt using bedrail despite cuing not to in order to replicate home environment, with min assist for trunk upright and max cuing for sequencing and L hemibody management. Sitting EOB donned B shoes max assist for time management. R stand pivot EOB>w/c using RW with CGA/min assist for steadying/balance and again therapist providing sequential step-by-step cuing to ensure proper L LE stepping/positioning and placing L hand on/off RW orthotic with improved standing balance. Performed additional stand pivot transfer during session using RW with CGA for steadying and mod cuing for sequencing to increase balance and safety. Transported to/from gym in w/c for energy conservation. Therapist donned L LE PLS AFO with medial strut max assist - still using heel wedge under L AFO to decrease knee hyperextension during stance. Gait training ~369fusing RW with min/mod assist for balance and pt demonstrating significantly improved L LE foot clearance with decreased L knee hyperextension during stance - continues to have L foot/hip external rotation during stance as well as increased L hip adduction during swing requiring mod cuing for correction. Gait training 10387fsing RW with min assist for balance - continued to demonstrate above gait impairments with mod multimodal cuing for improvement and intermittent manual facilitation for improved L lateral weight shift and L hip extension during stance - only 2x L knee hyperextension during this walk (no knee buckling). Pt transported back to room in w/c and agreeable to remain sitting in w/c for meal - pt left with needs in reach and seat belt alarm on.  Therapy Documentation Precautions:   Precautions Precautions:  Fall Precaution Comments: left hemiparesis Required Braces or Orthoses: Other Brace Other Brace: L AFO Restrictions Weight Bearing Restrictions: No  Pain:   Session 1: No complaints of pain during session.  Session 2: No complaints of pain during session.   Therapy/Group: Individual Therapy  Tawana Scale, PT, DPT 03/25/2019, 7:50 AM

## 2019-03-25 NOTE — Consult Note (Signed)
Neuropsychological Consultation   Patient:   Ann Lopez   DOB:   1970-09-26  MR Number:  [id number]  Location:  [facility] [id number] [place] [address] Dept: [place]: [phone]           Date of Service:   03/25/2019  Start Time:   1 PM End Time:   2 PM  Provider/Observer:  Ilean Skill, Psy.D.       Clinical Neuropsychologist       Billing Code/Service: 96158/96159  Chief Complaint:    Ann Lopez is a 48 year old female with a history of type 2 diabetes, hypertension, gastric sleeve, breast cancer with bilateral mastectomy with BRCA gene and strong family history of BRCA gene.  The patient also describes late effects of mild cognitive impairments memory impairments from chemotherapy, tobacco use, migraines, back pain.  The patient was admitted on 03/01/2019 after a fall from the commode and found to have left-sided weakness with left neglect and right gaze preference.  Patient with UDS positive for opioids and amphetamines.  Patient denies amphetamine use and may be FP. CT head has shown infarction right lentiform nucleus and resolving confluent petechial hemorrhae and persistent partial effacement of right lateral ventricle.  Felt to have been embolic in nature due to large vessel stenosis.  Patient with residual left facial droop and cognitive deficits, left sided weakness (improving left leg) and improved right side gaze preference.  The patient has been improving during rehab and is made significant improvements in left leg function.  She continues to have significant motor deficits in her left arm.  More recently, the patient has been having what she describes as panic events.  She reports that she had multiple panic events last night and 2 panic events today.  Some of these appear to be significant anxiety states rather than panic but she does describe a couple of  events that would have been consistent with full-blown panic attacks.  The patient is already taking some benzo diazepam and there is concerns about going up any further with benzodiazepine.  Reason for Service:  Ann Lopez is a 48 year old female with history of T2DM, HTN, gastricsleeve, breast cancer s/p bilateral mastectomywith TAH/BSOand post chemo memory deficits, tobacco use, migraines, back pain who was admitted on 03/02/19 after fall from the commode and found to have left sided weakness with left neglect and right gaze preference. UDS positive with opioids and amphetamines. CTA/P head revealed near occlusive thrombus within R-ICA terminus extending into M1 R-MCA with A1 R-ACA with core infarct in R-MCA and large penumbra as well as incidental finding of left thyroid nodule. She was taken for cerebral angio with emergent thrombectomy and complete revascularization of occluded R- ICA terminus, R-MCA and R-ACA with stent assisted angioplasty of symptomatic proximal R-ICA stenosis by DR. Deveshwar. Post procedure with hypotension felt to be due to carotid manipulation as well as decrease in LOC with increase in LLE tone. EEG showed diffuse encephalopathy and repeat CT head showed some mass effect on right lateral ventricle and was negative for hemorrhage.   She tolerated extubation without difficulty but had bouts of agitation alternating with lethargy. 2D echo showed EF 60-65% with no LVH and no wall abnormality. Most recent CT head 11/25 showed residual hyperdensity within infarcted right lentiform nucleus c/w resolving confluent petechial hemorrhage and persistent partial effacement of right lateral ventricle. Dr. Erlinda Hong felt that stroke embolic  due to large vessel stenosis but PAF not fully ruled out--30 day cardiac event monitor recommended on outpatient basis. Neurology recommending SBP goal 120-140--hypotensive today with SBP in 90's thereforetreated with fluid bolus.Patient with  resultant left facial droop with cognitive deficits, left sided weakness and right gaze preference with left neglect affecting mobility and ADLs. CIR recommended due to functional decline.  Current Status:  The patient continues to describe significant anxiety and depressive symptoms.  Recent panic events are reported over the past 2 days.  The patient does have a history of prior panic disorder.  The patient reports that her depression anxiety has been more acute as she is becoming increasingly concerned about her status when she is discharged even though she has some time left on the rehab unit she is very concerned about residual deficits and being unable to care for herself..    Behavioral Observation: Ann Lopez  presents as a 48 y.o.-year-old Right Caucasian Female who appeared her stated age. her dress was Appropriate and she was Well Groomed and her manners were Appropriate to the situation.  her participation was indicative of Appropriate and Attentive behaviors.  There were any physical disabilities noted.  she displayed an appropriate level of cooperation and motivation.     Interactions:    Active Appropriate and Redirectable  Attention:   abnormal and attention span appeared shorter than expected for age  Memory:   abnormal; remote memory intact, recent memory impaired  Visuo-spatial:  not examined but indications of visual neglect  Speech (Volume):  normal  Speech:   normal; normal  Thought Process:  Coherent and Relevant  Though Content:  WNL; not suicidal and not homicidal  Orientation:   person, place, time/date and situation  Judgment:   Fair  Planning:   Fair  Affect:    Appropriate  Mood:    Dysphoric with history of significant depressive events.  Insight:   Fair  Intelligence:   normal   Medical History:   Past Medical History:  Diagnosis Date  . Allergy   . Anxiety   . Asthma   . Back pain   . Breast cancer ([facility])   . Cancer ([facility])   .  Depression   . Diabetes mellitus   . Headache   . Hyperlipemia   . Osteopenia   . Osteoporosis    osteopenia        Psychiatric History:  Past history of depression and anxiety.    Family Med/Psych History:  Family History  Problem Relation Age of Onset  . Cancer Mother 5       breast cancer  . Hypertension Sister   . Hypothyroidism Sister   . Cancer Sister   . Hypothyroidism Brother   . Cancer Maternal Grandmother 4       breast cancer  . Cancer Maternal Grandfather 55       pancreatic cancer    Impression/DX:  Ann Lopez is a 48 year old female with a history of type 2 diabetes, hypertension, gastric sleeve, breast cancer with bilateral mastectomy with BRCA gene and strong family history of BRCA gene.  The patient also describes late effects of mild cognitive impairments memory impairments from chemotherapy, tobacco use, migraines, back pain.  The patient was admitted on 03/01/2019 after a fall from the commode and found to have left-sided weakness with left neglect and right gaze preference.  Patient with UDS positive for opioids and amphetamines.  Patient denies amphetamine use and may be FP. CT head  has shown infarction right lentiform nucleus and resolving confluent petechial hemorrhae and persistent partial effacement of right lateral ventricle.  Felt to have been embolic in nature due to large vessel stenosis.  Patient with residual left facial droop and cognitive deficits, left sided weakness (improving left leg) and improved right side gaze preference.  More recently, the patient has been having what she describes as panic events.  She reports that she had multiple panic events last night and 2 panic events today.  Some of these appear to be significant anxiety states rather than panic but she does describe a couple of events that would have been consistent with full-blown panic attacks.  The patient is already taking some benzo diazepam and there is concerns about going  up any further with benzodiazepine.  The patient continues to describe significant anxiety and depressive symptoms.  Recent panic events are reported over the past 2 days.  The patient does have a history of prior panic disorder.  The patient reports that her depression anxiety has been more acute as she is becoming increasingly concerned about her status when she is discharged even though she has some time left on the rehab unit she is very concerned about residual deficits and being unable to care for herself.  Disposition/Plan:  Today we continue to work on issues related primarily to her anxiety and panic events.  We also worked on other coping issues around her underlying major depressive disorder.  The patient was very attentive and we worked on various coping skills and strategies for when she begins to feel increased anxiety that may transform into a full-blown panic attack.  We discussed the need to keep benzodiazepines to a minimum as long as we can keep her panic events under control.  I will follow-up with the patient next week..        Electronically Signed   _______________________ Ilean Skill, Psy.D.

## 2019-03-25 NOTE — Progress Notes (Signed)
Patient complains of increasing anxiety, informed nurse she had a panic attack last night and yesterday evening. Adria Devon, LPN

## 2019-03-25 NOTE — Patient Care Conference (Addendum)
Inpatient RehabilitationTeam Conference and Plan of Care Update Date: 03/25/2019   Time: 10:35 AM   Patient Name: Ann Lopez      Medical Record Number: [id number]  Date of Birth: Jan 31, 1971 Sex: Female         Room/Bed: 4W17C/4W17C-01 Payor Info: Payor: [employer] MEDICARE / Plan: [facility] MEDICARE / Product Type: *No Product type* /    Admit Date/Time:  03/09/2019  2:20 PM  Primary Diagnosis:  Acute ischemic right middle cerebral artery (MCA) stroke [facility])  Patient Active Problem List   Diagnosis Date Noted  . Panic disorder with agoraphobia and moderate panic attacks   . Major depressive disorder, recurrent episode, moderate ([facility])   . MCI (mild cognitive impairment) with memory loss   . Acute ischemic right middle cerebral artery (MCA) stroke ([facility]) 03/09/2019  . Left hemiparesis ([facility])   . Bradycardia   . Tachypnea   . SIRS (systemic inflammatory response syndrome) ([facility])   . Hypokalemia   . Diabetes mellitus type 2 in obese ([place])   . Tobacco abuse   . History of breast cancer   . Cognitive deficits   . Dysphagia, post-stroke   . Stroke (cerebrum) ([facility]) 03/02/2019  . Middle cerebral artery embolism, right 03/02/2019  . Chest pain 12/19/2015  . Numbness 12/19/2015  . Femoral hernia 03/24/2014  . Dyslipidemia 09/10/2011  . BRCA1 positive 09/10/2011  . Breast cancer ([facility]) 09/10/2011  . H/O gastric bypass; 05/14/11([facility]) 09/10/2011  . Essential hypertension 10/28/2008  . DEPRESSION/ANXIETY 02/19/2007  . CARPAL TUNNEL SYNDROME, RIGHT 02/19/2007  . ALLERGIC RHINITIS 02/19/2007  . GERD 02/19/2007  . IRRITABLE BOWEL SYNDROME 02/19/2007  . HERPES GENITALIS 12/30/2006  . Diabetes ([facility]) 12/30/2006  . OSTEOPENIA 12/30/2006  . ANOMALY, CONGENITAL, NERVOUS SYSTEM NEC 12/30/2006    Expected Discharge Date: Expected Discharge Date: 03/31/19  Team Members Present: Physician leading conference: Dr. Alysia Penna Social Worker Present: Ovidio Kin, LCSW Nurse Present:  Ellison Carwin, LPN Case manager: Karene Fry, RN PT Present: Donnamae Jude, PT OT Present: Clyda Greener, OT SLP Present: Charolett Bumpers, SLP PPS Coordinator present : Gunnar Fusi, SLP     Current Status/Progress Goal Weekly Team Focus  Bowel/Bladder   Pt is continent of b/b, lbm 03/24/19  Maintain continence, timed toileting while awake  Q2h toileting/PRN   Swallow/Nutrition/ Hydration             ADL's   UB bathing and dressing with supervision, LB dressing/ bathing with steady A, toileting with min A, transfers with and without RW with min A with mod cues for safety  min guard overall  NMR, balance retraining, transfer training, functional mobiltiy, pt and family education, therapeutic activity   Mobility   min assist bed mobility, min assist stand pivot transfers using RW, gait 57f using RW with mod assist and L LE AFO, mod assist 4 steps using 1 HR  supervision bed mobility, CGA transfers; min assist gait 1067fusing LRAD; and min assist 3 stair navigation using handrails  LE strength, L LE neuromuscular re-education, activity tolerance, transfer training, gait trianing, pt education, L attention, standing balance   Communication             Safety/Cognition/ Behavioral Observations            Pain   Pain is 6/10, pain located in lower back. PRN Norco  Pain will be free of pain  Assess pain Qshift/PRN   Skin   NO obvious signs of skin breakdown.  Skin will be  free of breakdown/free of infection  assess skin qshift/prn      *See Care Plan and progress notes for long and short-term goals.     Barriers to Discharge  Current Status/Progress Possible Resolutions Date Resolved   Nursing                  PT                    OT                  SLP                SW                Discharge Planning/Teaching Needs:  Family coming in to begin training in anticipation of DC 12/22. Aware she will require 24 hr physical care. Neruo-psych seeing for coping       Team Discussion: Panic attacks, neuropsych to see, h/o breast CA, monitoring BP, DM managed.  RN A+O, BS good, panic attack this am, cont B/B, norco for back pain given.  OT min UB bathing, min/mod LB dressing, goals CGA.  PT min bed, transfers min, amb 30' RW mod A, AFO consult tomorrow, mod A 4 steps with 1 rail, goals to be downgraded.  Plan fam ed Saturday, crying today, wants to go home, L inattention, easily distracted.   Revisions to Treatment Plan: N/A     Medical Summary Current Status: blood sugar well managed unless family brings her food, pain well managed on dose of norco that is less than her OP dose Weekly Focus/Goal: Neuropsych to address anxiety issues  Barriers to Discharge: Medical stability   Possible Resolutions to Barriers: see above, outpt f/u with neuropsych recommended   Continued Need for Acute Rehabilitation Level of Care: The patient requires daily medical management by a physician with specialized training in physical medicine and rehabilitation for the following reasons: Direction of a multidisciplinary physical rehabilitation program to maximize functional independence : Yes Medical management of patient stability for increased activity during participation in an intensive rehabilitation regime.: Yes Analysis of laboratory values and/or radiology reports with any subsequent need for medication adjustment and/or medical intervention. : Yes   I attest that I was present, lead the team conference, and concur with the assessment and plan of the team.   Retta Diones 03/26/2019, 9:34 AM  Team conference was held via web/ teleconference due to [place] - 19

## 2019-03-25 NOTE — Progress Notes (Signed)
[place] PHYSICAL MEDICINE & REHABILITATION PROGRESS NOTE   Subjective/Complaints:  Panic attacks last noc, had hx of this after breast Ca and in fact spent 1 wk in hospital for wu of CP  ROS: Patient denies , nausea, vomiting, diarrhea, cough, shortness of breath or chest pain, joint or back pain,  Objective:   No results found. Recent Labs    03/23/19 0704  WBC 6.9  HGB 15.5*  HCT 47.7*  PLT 299   Recent Labs    03/23/19 0704  NA 139  K 4.1  CL 108  CO2 23  GLUCOSE 101*  BUN 11  CREATININE 0.70  CALCIUM 9.3    Intake/Output Summary (Last 24 hours) at 03/25/2019 0828 Last data filed at 03/24/2019 1925 Gross per 24 hour  Intake 480 ml  Output --  Net 480 ml     Physical Exam: Vital Signs Blood pressure 100/64, pulse 63, temperature 98 F (36.7 C), temperature source Oral, resp. rate 16, height 5' 3"  (1.6 m), weight 79.8 kg, SpO2 100 %.   Constitutional: No distress . Vital signs reviewed. HEENT: EOMI, oral membranes moist Neck: supple Cardiovascular: RRR without murmur. No JVD    Respiratory: CTA Bilaterally without wheezes or rales. Normal effort    GI: BS +, non-tender, non-distended  Extremities: No clubbing, cyanosis, or edema Skin: No evidence of breakdown, no evidence of rash Neurologic: Cranial nerves II through XII intact, motor strength is 5/5 in right  deltoid, bicep, tricep, grip, hip flexor, knee extensors, ankle dorsiflexor and plantar flexor 2- Left UE flexor synergy pattern Brunnstrom 3  and 3/5 LLE--stable exam Sensory exam normal sensation to light touch and proprioception in bilateral upper and lower extremities  Musculoskeletal: Full range of motion in all 4 extremities. No joint swelling Psych: pleasant  Assessment/Plan: 1. Functional deficits secondary to Right MCA infarct which require 3+ hours per day of interdisciplinary therapy in a comprehensive inpatient rehab setting.  Physiatrist is providing close team supervision and 24  hour management of active medical problems listed below.  Physiatrist and rehab team continue to assess barriers to discharge/monitor patient progress toward functional and medical goals  Care Tool:  Bathing    Body parts bathed by patient: Left arm, Chest, Abdomen, Right upper leg, Left upper leg, Right lower leg, Face, Front perineal area, Buttocks, Left lower leg   Body parts bathed by helper: Right arm     Bathing assist Assist Level: Minimal Assistance - Patient > 75%     Upper Body Dressing/Undressing Upper body dressing   What is the patient wearing?: Pull over shirt    Upper body assist Assist Level: Set up assist    Lower Body Dressing/Undressing Lower body dressing      What is the patient wearing?: Pants, Underwear/pull up     Lower body assist Assist for lower body dressing: Minimal Assistance - Patient > 75%     Toileting Toileting    Toileting assist Assist for toileting: Minimal Assistance - Patient > 75%     Transfers Chair/bed transfer  Transfers assist     Chair/bed transfer assist level: Minimal Assistance - Patient > 75%     Locomotion Ambulation   Ambulation assist   Ambulation activity did not occur: Safety/medical concerns  Assist level: Moderate Assistance - Patient 50 - 74% Assistive device: Walker-rolling Max distance: 28f   Walk 10 feet activity   Assist     Assist level: Moderate Assistance - Patient - 50 - 74% Assistive  device: Walker-rolling   Walk 50 feet activity   Assist Walk 50 feet with 2 turns activity did not occur: Safety/medical concerns         Walk 150 feet activity   Assist Walk 150 feet activity did not occur: Safety/medical concerns         Walk 10 feet on uneven surface  activity   Assist Walk 10 feet on uneven surfaces activity did not occur: Safety/medical concerns         Wheelchair     Assist Will patient use wheelchair at discharge?: (TBD) Type of Wheelchair:  Manual    Wheelchair assist level: Minimal Assistance - Patient > 75% Max wheelchair distance: 150 ft    Wheelchair 50 feet with 2 turns activity    Assist        Assist Level: Minimal Assistance - Patient > 75%   Wheelchair 150 feet activity     Assist  Wheelchair 150 feet activity did not occur: Safety/medical concerns   Assist Level: Minimal Assistance - Patient > 75%   Blood pressure 100/64, pulse 63, temperature 98 F (36.7 C), temperature source Oral, resp. rate 16, height 5' 3"  (1.6 m), weight 79.8 kg, SpO2 100 %.  Medical Problem List and Plan: 1.Left hemiparesis and functional deficitssecondary to right MCA infarct after right ICA,MCA, ACA occlusion. Pt s/p reperfusion/stenting---likely DM and smoking as main risk ractors  Cont CIR PT, OT SLP -Team conference today please see physician documentation under team conference tab, met with team face-to-face to discuss problems,progress, and goals. Formulized individual treatment plan based on medical history, underlying problem and comorbidities.   -ELOS 12/22              -left PRAFO/RWHO at HS 2. Antithrombotics: -DVT/anticoagulation:Pharmaceutical:Lovenoxadded 11/30. Will order dopplers for work up.  -antiplatelet therapy: On ASA/Brillita. 3.Chronic thoracic/ shoulder pain/Pain Management:On hydrocodone 43m QIDat home (went to pain clinic), currently on 578mq6h prn, does ok with this and would rec this  be d/c dose with f/u at her pain clinic  -It has been discussed that CVA pts more susceptible to side effects of depressant meds   HA,controlled but needs topiramate  BID    4. Mood:LCSW to follow for evaluation and support. Will order sleep wake chart. -antipsychotic agents: N/A  -Sleep:    Dc seroquel per pt request   -try restoril 1521mhich she has used at home     Hx of depression and anxiety - appreciate neuropsych, on Cymbalta and Klonopin 5.  Neuropsych: This patientiscapable of making decisions on herown behalf. 6. Skin/Wound Care:Routine pressure relief measures. 7. Fluids/Electrolytes/Nutrition:Monitor I/O-K+ normalized 8. R-ICAthrombus s/pstent: On Brillinta and ASA. Monitor CBC serially. Avoid hypotension. 9. Leucocytosis: Trending down with WBC 20-->10.8on 11/30. 10. Hypomagnesemia:Recheck levels in am. Likely needs multiple vitamins due to history of gastric bypass. 11. T2DM: Hgb A1C- 9.0. Was on lantus 40 units with meal coverage and invokana PTA.  CBG (last 3)  Recent Labs    03/24/19 1738 03/24/19 2117 03/25/19 0659  GLUCAP 167* 138* 119*  on Lantus 15U and Invokana controlled  12/15 12. Left thyroid nodule/Pulmonary nodules: Per CT neck 03/02/2019. Non-emergent thyroid ultrasound for follow up recommended.  13. COPD: On breo daily with albuterol prn.  14. Anxiety/depression with related insomnia : On Desvenlafaxine and clonazepam PTA?Now on Cymbalta 60 mg/day   Improved with restoril last night   -sleep graph    13. Bradycardia: HR's has been down to high 40's in the past 24  hours. Asymptomatic? Will monitor for now.  14.No hx HTN, has historically run low BPs  Vitals:   03/24/19 2027 03/25/19 0436  BP: 109/78 100/64  Pulse: 79 63  Resp: 20 16  Temp: 97.8 F (36.6 C) 98 F (36.7 C)  SpO2: 93% 100%  controlled 12/16, normal BP for this pt ~90/60 15. Left breast cancer s/p bilateral mastectomy:  16.  Dysphagia related to CVA, monitor asp PNA, cont SLP 17.  Chronic constipation will add senna and prn dulc supp   LOS: 16 days A FACE TO FACE EVALUATION WAS PERFORMED  Charlett Blake 03/25/2019, 8:28 AM

## 2019-03-26 ENCOUNTER — Inpatient Hospital Stay (HOSPITAL_COMMUNITY): Payer: Medicare Other | Admitting: Occupational Therapy

## 2019-03-26 ENCOUNTER — Inpatient Hospital Stay (HOSPITAL_COMMUNITY): Payer: Medicare Other

## 2019-03-26 ENCOUNTER — Inpatient Hospital Stay (HOSPITAL_COMMUNITY): Payer: Medicare Other | Admitting: Physical Therapy

## 2019-03-26 LAB — GLUCOSE, CAPILLARY
Glucose-Capillary: 106 mg/dL — ABNORMAL HIGH (ref 70–99)
Glucose-Capillary: 114 mg/dL — ABNORMAL HIGH (ref 70–99)
Glucose-Capillary: 139 mg/dL — ABNORMAL HIGH (ref 70–99)
Glucose-Capillary: 121 mg/dL — ABNORMAL HIGH (ref 70–99)

## 2019-03-26 NOTE — Progress Notes (Signed)
[place] PHYSICAL MEDICINE & REHABILITATION PROGRESS NOTE   Subjective/Complaints:  Appreciate neuropsychology note, no issues with sleep last night.  Tolerating therapy well.  Looking forward to 12/22 discharge date  ROS: Patient denies , nausea, vomiting, diarrhea, cough, shortness of breath or chest pain, joint or back pain,  Objective:   No results found. No results for input(s): WBC, HGB, HCT, PLT in the last 72 hours. No results for input(s): NA, K, CL, CO2, GLUCOSE, BUN, CREATININE, CALCIUM in the last 72 hours. No intake or output data in the 24 hours ending 03/26/19 0940   Physical Exam: Vital Signs Blood pressure 125/74, pulse 67, temperature 98.2 F (36.8 C), temperature source Oral, resp. rate 16, height 5\' 3"  (1.6 m), weight 82 kg, SpO2 100 %.   Constitutional: No distress . Vital signs reviewed. HEENT: EOMI, oral membranes moist Neck: supple Cardiovascular: RRR without murmur. No JVD    Respiratory: CTA Bilaterally without wheezes or rales. Normal effort    GI: BS +, non-tender, non-distended  Extremities: No clubbing, cyanosis, or edema Skin: No evidence of breakdown, no evidence of rash Neurologic: Cranial nerves II through XII intact, motor strength is 5/5 in right  deltoid, bicep, tricep, grip, hip flexor, knee extensors, ankle dorsiflexor and plantar flexor 2- Left UE flexor synergy pattern Brunnstrom 3  and 3/5 LLE--stable exam Sensory exam normal sensation to light touch and proprioception in bilateral upper and lower extremities  Musculoskeletal: Full range of motion in all 4 extremities. No joint swelling Psych: pleasant  Assessment/Plan: 1. Functional deficits secondary to Right MCA infarct which require 3+ hours per day of interdisciplinary therapy in a comprehensive inpatient rehab setting.  Physiatrist is providing close team supervision and 24 hour management of active medical problems listed below.  Physiatrist and rehab team continue to assess  barriers to discharge/monitor patient progress toward functional and medical goals  Care Tool:  Bathing    Body parts bathed by patient: Left arm, Chest, Abdomen, Right upper leg, Left upper leg, Right lower leg, Face, Front perineal area, Buttocks, Left lower leg   Body parts bathed by helper: Right arm     Bathing assist Assist Level: Minimal Assistance - Patient > 75%     Upper Body Dressing/Undressing Upper body dressing   What is the patient wearing?: Pull over shirt    Upper body assist Assist Level: Set up assist    Lower Body Dressing/Undressing Lower body dressing      What is the patient wearing?: Pants, Underwear/pull up     Lower body assist Assist for lower body dressing: Minimal Assistance - Patient > 75%     Toileting Toileting    Toileting assist Assist for toileting: Minimal Assistance - Patient > 75%     Transfers Chair/bed transfer  Transfers assist     Chair/bed transfer assist level: Minimal Assistance - Patient > 75%     Locomotion Ambulation   Ambulation assist   Ambulation activity did not occur: Safety/medical concerns  Assist level: Moderate Assistance - Patient 50 - 74% Assistive device: Walker-rolling Max distance: 128ft   Walk 10 feet activity   Assist     Assist level: Moderate Assistance - Patient - 50 - 74% Assistive device: Walker-rolling   Walk 50 feet activity   Assist Walk 50 feet with 2 turns activity did not occur: Safety/medical concerns  Assist level: Moderate Assistance - Patient - 50 - 74% Assistive device: Walker-rolling    Walk 150 feet activity   Assist  Walk 150 feet activity did not occur: Safety/medical concerns         Walk 10 feet on uneven surface  activity   Assist Walk 10 feet on uneven surfaces activity did not occur: Safety/medical concerns         Wheelchair     Assist Will patient use wheelchair at discharge?: (TBD) Type of Wheelchair: Manual    Wheelchair  assist level: Minimal Assistance - Patient > 75% Max wheelchair distance: 150 ft    Wheelchair 50 feet with 2 turns activity    Assist        Assist Level: Minimal Assistance - Patient > 75%   Wheelchair 150 feet activity     Assist  Wheelchair 150 feet activity did not occur: Safety/medical concerns   Assist Level: Minimal Assistance - Patient > 75%   Blood pressure 125/74, pulse 67, temperature 98.2 F (36.8 C), temperature source Oral, resp. rate 16, height 5\' 3"  (1.6 m), weight 82 kg, SpO2 100 %.  Medical Problem List and Plan: 1.Left hemiparesis and functional deficitssecondary to right MCA infarct after right ICA,MCA, ACA occlusion. Pt s/p reperfusion/stenting---likely DM and smoking as main risk ractors  Cont CIR PT, OT SLP -  -ELOS 12/22              -left PRAFO/RWHO at HS 2. Antithrombotics: -DVT/anticoagulation:Pharmaceutical:Lovenoxadded 11/30. Will order dopplers for work up.  -antiplatelet therapy: On ASA/Brillita. 3.Chronic thoracic/ shoulder pain/Pain Management:On hydrocodone 10mg  QIDat home (went to pain clinic), currently on 5mg  q6h prn, does ok with this and would rec this  be d/c dose with f/u at her pain clinic  -It has been discussed that CVA pts more susceptible to side effects of depressant meds   HA,controlled but needs topiramate  BID    4. Mood:LCSW to follow for evaluation and support. Will order sleep wake chart. -antipsychotic agents: N/A  -Sleep:    Dc seroquel per pt request   -try restoril 15mg  which she has used at home     Hx of depression and anxiety - appreciate neuropsych, on Cymbalta and Klonopin 5. Neuropsych: This patientiscapable of making decisions on herown behalf. 6. Skin/Wound Care:Routine pressure relief measures. 7. Fluids/Electrolytes/Nutrition:Monitor I/O-K+ normalized 8. R-ICAthrombus s/pstent: On Brillinta and ASA. Monitor CBC serially. Avoid  hypotension. 9. Leucocytosis: Trending down with WBC 20-->10.8on 11/30. 10. Hypomagnesemia:Recheck levels in am. Likely needs multiple vitamins due to history of gastric bypass. 11. T2DM: Hgb A1C- 9.0. Was on lantus 40 units with meal coverage and invokana PTA.  CBG (last 3)  Recent Labs    03/25/19 1730 03/25/19 2139 03/26/19 0608  GLUCAP 125* 159* 106*  on Lantus 15U and Invokana controlled  12/17 12. Left thyroid nodule/Pulmonary nodules: Per CT neck 03/02/2019. Non-emergent thyroid ultrasound for follow up recommended.  13. COPD: On breo daily with albuterol prn.  14. Anxiety/depression with related insomnia : On Desvenlafaxine and clonazepam PTA?Now on Cymbalta 60 mg/day appreciate neuropsychology evaluation  Improved with restoril last night   -sleep graph    13. Bradycardia: HR's has been down to high 40's in the past 24 hours. Asymptomatic? Will monitor for now.  14.No hx HTN, has historically run low BPs  Vitals:   03/25/19 1935 03/26/19 0342  BP: (!) 85/59 125/74  Pulse: 76 67  Resp: 16 16  Temp: 97.6 F (36.4 C) 98.2 F (36.8 C)  SpO2: 96% 100%  controlled 12/17, normal BP for this pt ~90/60 15. Left breast cancer s/p bilateral mastectomy:  16.  Dysphagia related to CVA, monitor asp PNA, cont SLP 17.  Chronic constipation will add senna and prn dulc supp   LOS: 17 days A FACE TO FACE EVALUATION WAS PERFORMED  Charlett Blake 03/26/2019, 9:40 AM

## 2019-03-26 NOTE — Progress Notes (Signed)
Occupational Therapy Session Note  Patient Details  Name: [name] MRN: [id number] Date of Birth: Aug 21, 1970  Today's Date: 03/26/2019 OT Individual Time: [id number] OT Individual Time Calculation (min): 33 min    Short Term Goals: Week 3:  OT Short Term Goal 1 (Week 3): Pt will continue working toward LTGs set a min assist or less.  Skilled Therapeutic Interventions/Progress Updates:    Patient in bed, alert, ready for therapy.  Denies pain.  Bed mobility with CS.  Sit pivot transfer bed to w/c with CGA.  Completed UB dressing seated in w/c with set up, LB dressing with min A.  Socks/shoes/AFO with mod A.  Grooming tasks seated in w/c with set up.  She remained in w/c at close of session with seat belt alarm set and cal bell in reach.    Therapy Documentation Precautions:  Precautions Precautions: Fall Precaution Comments: left hemiparesis Required Braces or Orthoses: Other Brace Other Brace: L AFO Restrictions Weight Bearing Restrictions: No General:   Vital Signs:   Pain: Pain Assessment Pain Scale: 0-10 Pain Score: 0-No pain Pain Type: Chronic pain Pain Location: Back Pain Orientation: Lower Pain Descriptors / Indicators: Aching;Discomfort;Dull Pain Frequency: Constant Pain Onset: On-going Pain Intervention(s): Medication (See eMAR);Repositioned   Other Treatments:     Therapy/Group: Individual Therapy  Carlos Levering 03/26/2019, 12:11 PM

## 2019-03-26 NOTE — Progress Notes (Signed)
Recreational Therapy Session Note  Patient Details  Name: Ann Lopez MRN: [id number] Date of Birth: 1970-06-15 Today's Date: 03/26/2019 Time:  930-10 Pain: no c/o Skilled Therapeutic Interventions/Progress Updates: Session focused on activity tolerance, weight shifting & dynamic balance during co-treat with PT.  Pt stood and transitioned to tall kneeling on therapy mat with mod assist for horseshoe reaching activity.  Pt required min assist/cues for balance with tall kneeling task.  Pt with c/o dizziness requiring seated rest breaks.  Vitals assessed and WNLs. Cold washcloth provided during seated rest break, symptoms resolved.   Therapy/Group: Co-Treatment   Sabre Leonetti 03/26/2019, 3:49 PM

## 2019-03-26 NOTE — Progress Notes (Signed)
Physical Therapy Session Note  Patient Details  Name: Ann Lopez MRN: [id number] Date of Birth: 03-01-1971  Today's Date: 03/26/2019 PT Individual Time: 0930-1015 PT Individual Time Calculation (min): 45 min   Short Term Goals: Week 1:  PT Short Term Goal 1 (Week 1): Pt will perform supine<>sit with min assist PT Short Term Goal 1 - Progress (Week 1): Met PT Short Term Goal 2 (Week 1): Pt will perform stand pivot transfers using LRAD with min assist PT Short Term Goal 2 - Progress (Week 1): Met PT Short Term Goal 3 (Week 1): Pt will ambulate at least 67f using LRAD with mod assist of 1 and +2 w/c follow PT Short Term Goal 3 - Progress (Week 1): Met Week 2:  PT Short Term Goal 1 (Week 2): Pt will ambulate x50 ft with min assist and LRAD PT Short Term Goal 1 - Progress (Week 2): Progressing toward goal PT Short Term Goal 2 (Week 2): Pt will perform bed mobility CGA PT Short Term Goal 2 - Progress (Week 2): Met PT Short Term Goal 3 (Week 2): Pt will initiate stair training PT Short Term Goal 3 - Progress (Week 2): Met Week 3:  PT Short Term Goal 1 (Week 3): = to LTGs based on ELOS  Skilled Therapeutic Interventions/Progress Updates:      Therapy Documentation Precautions:  Precautions Precautions: Fall Precaution Comments: left hemiparesis Required Braces or Orthoses: Other Brace Other Brace: L AFO Restrictions Weight Bearing Restrictions: No   PAIN  Denies pain  Pt initially oob in wc.  Focus of session on balance and core strengthening.  wc propulsion x 248fw/bilat LEs w/verbal and tactile cues to attend to LLE, performed for bipedal task and hamstring strengthening. STS from wc w/min assist.  Gait 2592f/mod assist of 1 using RW w/hand trough and AFO.  Very significant instability at hip and knee noted, improves w/cues for upright posture.  wc to mat w/min assist and cues for safety/setup.  Sit to stand/turn/and transition to tall kneeling w/mod assist.  Worked on wt  shifting to L in tall kneeling via isometrics to L hip and reaching for horseshoes  across body to L  Hi and low targets (second person assisting/RT).  Pt repeated x 12-15 min w/rest breaks but then c/o dizzyness.  Vitals all WNL's BP 124/68, HR 84.  Pt rested in sitting w/cold cloth to forehead and sxs resolved.   Repeated STS w/reaching to L to facilitate wt shifting to L, repeated x 15 w/min assist for balance. Mat to wc spt w/min assist.  wc propulsion [id number] LE's w/cues to attend to L.  Transported via wc to room.  SPT wc to bed w/min assist.  Sit to supine w/min assist and cues.  Scoots in bed w/cues. Pt left supine w/rails up x 3, alarm set, bed in lowest position, and needs in reach.  Therapy/Group: Individual Therapy  BarbJerrilyn Cairo17/2020, 1:12 PM

## 2019-03-26 NOTE — Progress Notes (Signed)
Physical Therapy Session Note  Patient Details  Name: Ann Lopez MRN: [id number] Date of Birth: August 05, 1970  Today's Date: 03/26/2019 PT Individual Time: 1306-1401 PT Individual Time Calculation (min): 55 min   Short Term Goals: Week 3:  PT Short Term Goal 1 (Week 3): = to LTGs based on ELOS  Skilled Therapeutic Interventions/Progress Updates:   Pt received sitting EOB finishing lunch and agreeable to therapy session. [name], orthotist, present for AFO consult and he has returned pt's shoe with toe cap for improved swing through and decreased toe drag. Donned shoes and L LE posterior leaf spring AFO max assist for time management. Sit<>stands using RW with varying CGA/min assist for balance upon coming to standing due to continued L lateral trunk lean and decreased awareness of the lean - cuing for awareness and to ensure balance prior to placing L hand on/off RW orthotic. Gait training ~152ft using RW with varying min/mod assist for balance due to L anterior trunk lean and +2 w/c follow in event of fatigue - manual facilitation for improved L lateral weight shift and increased L hip extension during stance - cuing for improved L LE foot placement after swing due to increased hip adduction - with toe cap pt demonstrates decreased L hip external rotation and with AFO pt demonstrates improved foot clearance during swing - continues to have intermittent L knee hyperextension but with manual facilitation at hip it is less frequent. Therapist, patient, and orthotist discussed current bracing and decided it is appropriate and improves pt's gait mechanics - educated pt on recommendation and need to wear the shoes with toe cap, heel wedge, and posteiror leaf spring AFO. Transported remainder of distance into therapy gym. Ascended/descended 8 steps using R UE support on each handrail and primairly min assist (intermittent mod) for balance and guarding L knee buckling/hyperextension (no buckling and ~3x  hyperextension) with cuing for step-to pattern sequencing and pt demonstrating understanding. Dynamic gait training using RW of cone weaving with pt requiring increased assistance of mod A due to increased anterior trunk lean/LOB as well as increased cuing for proper AD management during the turns (especially turning R due to impaired forward push with L UE to turn AD), improved L LE foot placement when turning with increased awareness of its position, and intermittent manual facilitation for L lateral weight shift and hip extension during stance. Gait training using RW of stepping forwards/backwards ~73ft focusing on improved L LE backwards step during transfer with min/mod assist for balance and cuing for increased L hip/knee flexion when lifting foot to step backwards. Therapist and pt discussed scheduled family education with pt's sons on Saturday for physical therapy and pt's daugther-in-law on Monday for bathing and dressing - notified [name], [name] and scheduler. Transported back to room in w/c. R squat pivot w/c>EOB with min assist for balance. Pt left sitting EOB with needs in reach and bed alarm on.   Therapy Documentation Precautions:  Precautions Precautions: Fall Precaution Comments: left hemiparesis Required Braces or Orthoses: Other Brace Other Brace: L AFO Restrictions Weight Bearing Restrictions: No  Pain: No complaints of pain during session reporting that she had back pain after earlier PT session and had medication administered at that time.   Therapy/Group: Individual Therapy  Tawana Scale, PT, DPT 03/26/2019, 12:57 PM

## 2019-03-26 NOTE — Progress Notes (Signed)
Occupational Therapy Session Note  Patient Details  Name: Ann Lopez MRN: [id number] Date of Birth: [birth year]  Today's Date: 03/26/2019 OT Individual Time: 1105-1200 OT Individual Time Calculation (min): 55 min    Short Term Goals: Week 1:  OT Short Term Goal 1 (Week 1): Pt will complete UB dressing with min assist. OT Short Term Goal 1 - Progress (Week 1): Met OT Short Term Goal 2 (Week 1): Pt will complete LB bathing sit to stand with min assist. OT Short Term Goal 2 - Progress (Week 1): Not met OT Short Term Goal 3 (Week 1): Pt will complete LB dressing sit to stand. OT Short Term Goal 3 - Progress (Week 1): Met OT Short Term Goal 4 (Week 1): Pt will complete toilet transfers with mod assist stand pivot. OT Short Term Goal 4 - Progress (Week 1): Met Week 2:  OT Short Term Goal 1 (Week 2): Pt will complete LB bathing sit to stand with min assist. OT Short Term Goal 1 - Progress (Week 2): Met OT Short Term Goal 2 (Week 2): Pt will complete UB dressing with supervision in sitting OT Short Term Goal 2 - Progress (Week 2): Met OT Short Term Goal 3 (Week 2): Pt will use the LUE as a stablilizer with mod assist during bathing tasks. OT Short Term Goal 3 - Progress (Week 2): Not met OT Short Term Goal 4 (Week 2): Pt will complete toilet transfer with min assist stand pivot to the 3:1 or elevated toilet. OT Short Term Goal 4 - Progress (Week 2): Met Week 3:  OT Short Term Goal 1 (Week 3): Pt will continue working toward LTGs set a min assist or less.  Skilled Therapeutic Interventions/Progress Updates:    1:! Pt asleep in the bed when arrived but easily awoken. Pt reports having back pain earlier but with previous meds given and rest in bed pt reports it feels better. Pt came to EOB without bed rail on the right side of bed with min A with extra time and cues for placement placement to best assist herself. At EOB engaged in NMES (on custom) to forearm extensors and flexors (individual  first and then alternating; pt able to tolerant rate of 30. Coupled NMES with functional reach with mod A for UE.   With prolonged extension pt able to assist with filing her left hand nails with mod A for support against gravity.  Further discussion about stroke education and prevention of stroke as well as her functional status in her home environment. Also discussed some stress management techniques.   Therapy Documentation Precautions:  Precautions Precautions: Fall Precaution Comments: left hemiparesis Required Braces or Orthoses: Other Brace Other Brace: L AFO Restrictions Weight Bearing Restrictions: No Pain: None in session   Therapy/Group: Individual Therapy  Willeen Cass [facility] 03/26/2019, 1:09 PM

## 2019-03-27 ENCOUNTER — Inpatient Hospital Stay (HOSPITAL_COMMUNITY): Payer: Medicare Other | Admitting: Physical Therapy

## 2019-03-27 ENCOUNTER — Inpatient Hospital Stay (HOSPITAL_COMMUNITY): Payer: Medicare Other | Admitting: Occupational Therapy

## 2019-03-27 LAB — GLUCOSE, CAPILLARY
Glucose-Capillary: 109 mg/dL — ABNORMAL HIGH (ref 70–99)
Glucose-Capillary: 143 mg/dL — ABNORMAL HIGH (ref 70–99)
Glucose-Capillary: 180 mg/dL — ABNORMAL HIGH (ref 70–99)
Glucose-Capillary: 129 mg/dL — ABNORMAL HIGH (ref 70–99)

## 2019-03-27 NOTE — Progress Notes (Signed)
Physical Therapy Session Note  Patient Details  Name: GENITA STUBLER MRN: [id number] Date of Birth: 07-Apr-1971  Today's Date: 03/27/2019 PT Individual Time: 0805-0909 PT Individual Time Calculation (min): 64 min   Short Term Goals: Week 3:  PT Short Term Goal 1 (Week 3): = to LTGs based on ELOS  Skilled Therapeutic Interventions/Progress Updates:    Pt received supine in bed with MD present for morning assessment and pt agreeable to therapy session. Lattie Haw, Rec Therapist, present throughout session. Supine>sit, HOB elevated and using bedrail, with close supervision. Sitting EOB therapist donned B LE shoes and L LE AFO max assist for time management. Sit<>stands using RW with varying CGA/min assist for balance due to intermittent L lateral trunk lean - pt demonstrating improved recall/carry over of L hand placement on/off RW orthotic and L foot positioning prior to standing. Gait training 16ft using RW to therapy gym with min progressed to mod assist for balance due to anterior trunk lean and therapist manually facilitation L lateral weight shift and hip extension during stance phase for decreased L knee hyperextension - cuing throughout for forward movement of RW, increased R LE step length, ensuring full L LE step length during swing prior to initiating stance to decrease fall risk and education on monitoring L anterior trunk lean with fatigue. Ascended/descended 8 steps using R UE support on B HRs with min assist for balance and min cuing for recall of proper step-to sequencing - cuing for increased L hip abduction when stepping down and cuing for getting L foot on step fully during ascent. Transported to car room in w/c for time and energy conservation. Stand pivot simulated car transfer (SUV height) using RW with min/mod assist for balance due to L lateral trunk lean and mod cuing for sequencing - educated pt on performing stand pivot with RW versus taking a few steps for ambulatory transfer  depending on spacing in the doorway. Performed dynamic standing balance task without UE support of cross body reaching with R UE to grasp bean bags and play corn hole with mod assist of 1 for balance due to L lateral trunk lean and +2 for holding bean bags to increase pt L lateral weight shift - cuing and manual facilitation throughout for L knee extension and increased L hip abductor activation. Gait training ~153ft back to room using RW with min progressed to mod assist for balance and manual facilitation and cuing for improving the above gait mechanics. Sit>supine with CGA for steadying and pt left supine in bed with needs in reach and bed alarm on.  Therapy Documentation Precautions:  Precautions Precautions: Fall Precaution Comments: left hemiparesis Required Braces or Orthoses: Other Brace Other Brace: L AFO Restrictions Weight Bearing Restrictions: No  Pain: Reports she premedicated for therapy session.    Therapy/Group: Individual Therapy  Tawana Scale, PT, DPT 03/27/2019, 7:51 AM

## 2019-03-27 NOTE — Progress Notes (Signed)
Pt has glucometer at home and checks blood sugar prior to eating.  She is aware that most of the time CBG is good but dinner is elevated. Pt aware and informed me that is because of the carbs she eats when meals are brought up to her.  Informed that will have to count carbs when returns home and be more diligent about the foods she eats. Pt wanted to know if will be going home on sliding scale she continued on to say that doesn't give insulin to herself only oral medications at this time. Informed that when gets home that will need to record DBG's and provide information to PCP for follow up. PCP or physician managing her diabetes will make changes if needed according to blood sugars documented.

## 2019-03-27 NOTE — Progress Notes (Signed)
Occupational Therapy Session Note  Patient Details  Name: Ann Lopez MRN: [id number] Date of Birth: 1970-06-08  Today's Date: 03/27/2019 OT Individual Time: 1002-1058 OT Individual Time Calculation (min): 56 min   Short Term Goals: Week 3:  OT Short Term Goal 1 (Week 3): Pt will continue working toward LTGs set a min assist or less.  Skilled Therapeutic Interventions/Progress Updates:    Pt greeted in bed with no c/o pain, initiating transition EOB at sight of therapist. Vcs and assist for joint protection as her left arm was positioned behind trunk when almost EOB. She wanted to shower today. After OT donned Rt orthosis and shoes, pt completed Stedy transfer<TTB with supervision for sit<stand. Had pt support Lt arm by the wrist during transfer. She then bathed while seated, scanning to Lt for her shampoo and body wash. HOH to use Lt to wash Rt side of body and to stabilize/squeeze bottles. Close supervision for dynamic sitting balance while she leaned to complete pericare and also when using figure 4 position to wash feet. Pt wanted to stand pivot out of shower without her Rt AFO and stated she has done this before. Min A for sit<stand to support Lt side while she thoroughly dried off. Mod A for stand pivot<w/c out of shower, noted that Lt ankle rolled before she sat down. Next pt completed dressing tasks sit<stand at the sink. Able to recall her hemi strategies and utilize figure 4 position functionally. She needed Min A to don Rt AFO. Min A for standing balance and assist for pulling LB garments up on Lt side. Facilitated L UE weightbearing on sink during standing with noted hypertonicity in elbow. While she sat to complete oral care, HOH for incorporating Lt, and then while pt brushed teeth OT completed passive stretching of Lt elbow. She was agreeable to remain sitting up in the w/c. Tried to apply her lap tray however unable to due to a notch on her Lt armrest. Supported Lt arm with a pillow  instead and donned resting hand splint. Pt was left with safety belt fastened, in care of NT to assist her with further repositioning after the bed was made up. Tx focus placed on Lt NMR, ADL retrianing, standing balance, and sit<stands.   Therapy Documentation Precautions:  Precautions Precautions: Fall Precaution Comments: left hemiparesis Required Braces or Orthoses: Other Brace Other Brace: L AFO Restrictions Weight Bearing Restrictions: No Vital Signs: Therapy Vitals Temp: 97.9 F (36.6 C) Temp Source: Oral Pulse Rate: 82 Resp: 16 BP: 94/62 Patient Position (if appropriate): Lying Pain: Pain Assessment Pain Scale: Faces Pain Score: 0-No pain Pain Type: Chronic pain Pain Location: Back Pain Orientation: Lower Pain Descriptors / Indicators: Aching;Discomfort Pain Frequency: Constant Pain Onset: On-going Pain Intervention(s): Medication (See eMAR) ADL: ADL Eating: Supervision/safety Where Assessed-Eating: Wheelchair Grooming: Minimal assistance Where Assessed-Grooming: Wheelchair Upper Body Bathing: Minimal assistance Where Assessed-Upper Body Bathing: Wheelchair Lower Body Bathing: Moderate assistance Where Assessed-Lower Body Bathing: Wheelchair Upper Body Dressing: Moderate assistance Where Assessed-Upper Body Dressing: Wheelchair Lower Body Dressing: Maximal assistance Where Assessed-Lower Body Dressing: Wheelchair Toileting: Moderate assistance Where Assessed-Toileting: Bedside Commode Toilet Transfer: Maximal assistance Toilet Transfer Method: Stand pivot Toilet Transfer Equipment: Bedside commode      Therapy/Group: Individual Therapy  Ann Lopez A Ann Lopez 03/27/2019, 4:35 PM

## 2019-03-27 NOTE — Progress Notes (Signed)
Social Work Patient ID: Ann Lopez, female   DOB: 06-28-1970, 48 y.o.   MRN: [id number]  Have contacted front desk to inform of two son's coming in tomorrow at 9;00-11:00 for education. Spoke with Sunday Spillers to inform. Working on discharge needs for Tuesday

## 2019-03-27 NOTE — Progress Notes (Signed)
Recreational Therapy Session Note  Patient Details  Name: Ann Lopez MRN: [id number] Date of Birth: March 09, 1971 Today's Date: 03/27/2019 Time:  815-905 Pain: no c/o Skilled Therapeutic Interventions/Progress Updates: Session focused on activity tolerance, ambulation using RW, standing balance during co-treat with PT.  Pt ambulated 87' with RW with min-mod assist.  Pt also practiced car transfer and ascending/descending steps with PT.  Transitioned to playing corn hole.  Pt stood reaching up and to the left to facilitate left knee extension with min-mod assist.  Therapy/Group: Co-Treatment Gari Hartsell 03/27/2019, 9:22 AM

## 2019-03-27 NOTE — Progress Notes (Signed)
Occupational Therapy Session Note  Patient Details  Name: Ann Lopez MRN: [id number] Date of Birth: 02-Nov-1970  Today's Date: 03/27/2019 OT Individual Time: 1300-1425 OT Individual Time Calculation (min): 85 min    Short Term Goals: Week 3:  OT Short Term Goal 1 (Week 3): Pt will continue working toward LTGs set a min assist or less.  Skilled Therapeutic Interventions/Progress Updates:    Pt completed transition from her room down to the therapy gym via wheelchair.  Had her complete short distance functional mobility with use of the RW and mod assist for approximately 30 ft.  She then worked on weightbearing tasks in quadriped.  Mod assist needed to obtain position with max facilitation needed in the left elbow to maintain extension while washing the mat forward and to the right side with use of the LUE.  She transitioned to sitting and worked on small transitional movements with lean to the left on the LUE and pushing back up to sitting.  Had her tilt her head to the left to keep from activating her right trunk and focus more on the LUE.  Mod assist for transition to sitting from this position.  Had her finish mat work in supine with focus on active elbow extension/flexion, shoulder flexion, and shoulder external rotation.  She was able to demonstrate active movement in all patterns with greater movement noted in the elbow flexors and extensors.  Only trace movement noted in the external rotators.  Finished session with functional mobility with use of the RW for approximately 75'.  She needed mod assist secondary to LOB to the left and delayed step through on the left as well.  Returned to the room via wheelchair with transfer back to the bed with min assist.  NMES applied to the left shoulder at the below settings.  Pt tolerated one hour of stimulation without any adverse reactions.  Pt's son present in room, discussed upcoming family education over the weekend and Monday.  See below for estim  settings.  Saebo Stim Go 330 pulse width 35 Hz pulse rate On 8 sec/ off 8 sec Ramp up/ down 2 sec Symmetrical Biphasic wave form  Max intensity 15mA at 500 Ohm load  Therapy Documentation Precautions:  Precautions Precautions: Fall Precaution Comments: left hemiparesis Required Braces or Orthoses: Other Brace Other Brace: L AFO Restrictions Weight Bearing Restrictions: No  Pain: Pain Assessment Pain Scale: Faces Pain Score: 0-No pain Pain Type: Chronic pain Pain Location: Back Pain Orientation: Lower Pain Descriptors / Indicators: Aching;Discomfort Pain Frequency: Constant Pain Onset: On-going Pain Intervention(s): Medication (See eMAR) ADL: See Care Tool Section for some details of mobility and selfcare  Therapy/Group: Individual Therapy  Hasten Sweitzer OTR/L 03/27/2019, 4:05 PM

## 2019-03-27 NOTE — Progress Notes (Signed)
[facility] PHYSICAL MEDICINE & REHABILITATION PROGRESS NOTE   Subjective/Complaints:  No issues overnite , sleeping better   ROS: Patient denies , nausea, vomiting, diarrhea, cough, shortness of breath or chest pain, joint or back pain,  Objective:   No results found. No results for input(s): WBC, HGB, HCT, PLT in the last 72 hours. No results for input(s): NA, K, CL, CO2, GLUCOSE, BUN, CREATININE, CALCIUM in the last 72 hours.  Intake/Output Summary (Last 24 hours) at 03/27/2019 0804 Last data filed at 03/26/2019 1900 Gross per 24 hour  Intake 240 ml  Output --  Net 240 ml     Physical Exam: Vital Signs Blood pressure 111/75, pulse 70, temperature 98.3 F (36.8 C), temperature source Oral, resp. rate 18, height 5\' 3"  (1.6 m), weight 82 kg, SpO2 100 %.   Constitutional: No distress . Vital signs reviewed. HEENT: EOMI, oral membranes moist Neck: supple Cardiovascular: RRR without murmur. No JVD    Respiratory: CTA Bilaterally without wheezes or rales. Normal effort    GI: BS +, non-tender, non-distended  Extremities: No clubbing, cyanosis, or edema Skin: No evidence of breakdown, no evidence of rash Neurologic: Cranial nerves II through XII intact, motor strength is 5/5 in right  deltoid, bicep, tricep, grip, hip flexor, knee extensors, ankle dorsiflexor and plantar flexor 2- Left UE flexor synergy pattern Brunnstrom 3  and 3/5 LLE--stable exam Sensory exam normal sensation to light touch and proprioception in bilateral upper and lower extremities  Musculoskeletal: Full range of motion in all 4 extremities. No joint swelling Psych: pleasant  Assessment/Plan: 1. Functional deficits secondary to Right MCA infarct which require 3+ hours per day of interdisciplinary therapy in a comprehensive inpatient rehab setting.  Physiatrist is providing close team supervision and 24 hour management of active medical problems listed below.  Physiatrist and rehab team continue to  assess barriers to discharge/monitor patient progress toward functional and medical goals  Care Tool:  Bathing    Body parts bathed by patient: Left arm, Chest, Abdomen, Right upper leg, Left upper leg, Right lower leg, Face, Front perineal area, Buttocks, Left lower leg   Body parts bathed by helper: Right arm     Bathing assist Assist Level: Minimal Assistance - Patient > 75%     Upper Body Dressing/Undressing Upper body dressing   What is the patient wearing?: Pull over shirt    Upper body assist Assist Level: Set up assist    Lower Body Dressing/Undressing Lower body dressing      What is the patient wearing?: Pants, Underwear/pull up     Lower body assist Assist for lower body dressing: Minimal Assistance - Patient > 75%     Toileting Toileting    Toileting assist Assist for toileting: Minimal Assistance - Patient > 75%     Transfers Chair/bed transfer  Transfers assist     Chair/bed transfer assist level: Minimal Assistance - Patient > 75%     Locomotion Ambulation   Ambulation assist   Ambulation activity did not occur: Safety/medical concerns  Assist level: Moderate Assistance - Patient 50 - 74% Assistive device: Walker-rolling Max distance: 162ft   Walk 10 feet activity   Assist     Assist level: Moderate Assistance - Patient - 50 - 74% Assistive device: Walker-rolling   Walk 50 feet activity   Assist Walk 50 feet with 2 turns activity did not occur: Safety/medical concerns  Assist level: Moderate Assistance - Patient - 50 - 74% Assistive device: Walker-rolling  Walk 150 feet activity   Assist Walk 150 feet activity did not occur: Safety/medical concerns         Walk 10 feet on uneven surface  activity   Assist Walk 10 feet on uneven surfaces activity did not occur: Safety/medical concerns         Wheelchair     Assist Will patient use wheelchair at discharge?: (TBD) Type of Wheelchair: Manual     Wheelchair assist level: Minimal Assistance - Patient > 75% Max wheelchair distance: 150 ft    Wheelchair 50 feet with 2 turns activity    Assist        Assist Level: Minimal Assistance - Patient > 75%   Wheelchair 150 feet activity     Assist  Wheelchair 150 feet activity did not occur: Safety/medical concerns   Assist Level: Minimal Assistance - Patient > 75%   Blood pressure 111/75, pulse 70, temperature 98.3 F (36.8 C), temperature source Oral, resp. rate 18, height 5\' 3"  (1.6 m), weight 82 kg, SpO2 100 %.  Medical Problem List and Plan: 1.Left hemiparesis and functional deficitssecondary to right MCA infarct after right ICA,MCA, ACA occlusion. Pt s/p reperfusion/stenting---likely DM and smoking as main risk ractors  Cont CIR PT, OT SLP -  -ELOS 12/22              -left PRAFO/RWHO at HS 2. Antithrombotics: -DVT/anticoagulation:Pharmaceutical:Lovenoxadded 11/30. Will order dopplers for work up.  -antiplatelet therapy: On ASA/Brillita. 3.Chronic thoracic/ shoulder pain/Pain Management:On hydrocodone 10mg  QIDat home (went to pain clinic), currently on 5mg  q6h prn, does ok with this and would rec this  be d/c dose with f/u at her pain clinic  -It has been discussed that CVA pts more susceptible to side effects of depressant meds   HA,controlled but needs topiramate  BID    4. Mood:LCSW to follow for evaluation and support. Will order sleep wake chart. -antipsychotic agents: N/A  -Sleep:    Dc seroquel per pt request   -try restoril 15mg  which she has used at home     Hx of depression and anxiety - appreciate neuropsych, on Cymbalta and Klonopin 5. Neuropsych: This patientiscapable of making decisions on herown behalf. 6. Skin/Wound Care:Routine pressure relief measures. 7. Fluids/Electrolytes/Nutrition:Monitor I/O-K+ normalized 8. R-ICAthrombus s/pstent: On Brillinta and ASA. Monitor CBC serially.  Avoid hypotension. 9. Leucocytosis: Trending down with WBC 20-->10.8on 11/30. 10. Hypomagnesemia:Recheck levels in am. Likely needs multiple vitamins due to history of gastric bypass. 11. T2DM: Hgb A1C- 9.0. Was on lantus 40 units with meal coverage and invokana PTA.  CBG (last 3)  Recent Labs    03/26/19 1750 03/26/19 2100 03/27/19 0624  GLUCAP 139* 121* 109*  on Lantus 15U and Invokana controlled  12/18 12. Left thyroid nodule/Pulmonary nodules: Per CT neck 03/02/2019. Non-emergent thyroid ultrasound for follow up recommended.  13. COPD: On breo daily with albuterol prn.  14. Anxiety/depression with related insomnia : On Desvenlafaxine and clonazepam PTA?Now on Cymbalta 60 mg/day appreciate neuropsychology evaluation  Improved with restoril last night   -sleep graph 8h total last noc  5h an d3 h divided    13. Bradycardia: HR's has been down to high 40's in the past 24 hours. Asymptomatic? Will monitor for now.  14.No hx HTN, has historically run low BPs  Vitals:   03/26/19 2140 03/27/19 0620  BP: 105/70 111/75  Pulse:  70  Resp:  18  Temp:  98.3 F (36.8 C)  SpO2:  100%  controlled 12/18, normal BP  for this pt ~90/60 15. Left breast cancer s/p bilateral mastectomy:  16.  Dysphagia related to CVA, monitor asp PNA, cont SLP 17.  Chronic constipation will add senna and prn dulc supp   LOS: 18 days A FACE TO FACE EVALUATION WAS PERFORMED  Charlett Blake 03/27/2019, 8:04 AM

## 2019-03-28 ENCOUNTER — Ambulatory Visit (HOSPITAL_COMMUNITY): Payer: Medicare Other | Admitting: Physical Therapy

## 2019-03-28 ENCOUNTER — Encounter (HOSPITAL_COMMUNITY): Payer: Medicare Other | Admitting: Occupational Therapy

## 2019-03-28 DIAGNOSIS — E1165 Type 2 diabetes mellitus with hyperglycemia: Secondary | ICD-10-CM

## 2019-03-28 DIAGNOSIS — R7309 Other abnormal glucose: Secondary | ICD-10-CM

## 2019-03-28 DIAGNOSIS — G8929 Other chronic pain: Secondary | ICD-10-CM

## 2019-03-28 LAB — GLUCOSE, CAPILLARY
Glucose-Capillary: 111 mg/dL — ABNORMAL HIGH (ref 70–99)
Glucose-Capillary: 171 mg/dL — ABNORMAL HIGH (ref 70–99)
Glucose-Capillary: 139 mg/dL — ABNORMAL HIGH (ref 70–99)
Glucose-Capillary: 157 mg/dL — ABNORMAL HIGH (ref 70–99)

## 2019-03-28 NOTE — Progress Notes (Addendum)
Physical Therapy Session Note  Patient Details  Name: Ann Lopez MRN: [id number] Date of Birth: Dec 29, 1970  Today's Date: 03/28/2019 PT Individual Time: 1007-1111 PT Individual Time Calculation (min): 64 min   Short Term Goals: Week 3:  PT Short Term Goal 1 (Week 3): = to LTGs based on ELOS  Skilled Therapeutic Interventions/Progress Updates:    Pt received sitting in w/c with her daughter-in-law and son present for family education/trianing - pt agreeable to therapy session. Therapist educated pt's family on proper use of gait belt, pt's need for RW orthotic for L UE as well as L LE AFO, heel wedge, and shoe with toe cap for standing/ambulatory tasks.  Transported to/from gym in w/c. Therapist educated pt's family on stand pivot car transfer technique to/from w/c using RW and performed simulated car transfer (SUV height) using RW with min assist for balance and cuing for L LE stepping while educating family on proper positioning to provide patient assistance and proper set-up of transfer. Pt performed x4 simulated stand pivot car transfers using RW with pt's family taking turns providing hands-on assistance - required increased number of trials to improve family positioning when providing assistance and progress to performing transfer without hands-on assistance from therapist - therapist providing cuing throughout for improved pt sequencing of task and improved family assistance for increased safety. Therapist educated pt's family on having 2 people present for these transfers until can be performed safely with 1 person. Transported to main therapy gym. Therapist educated pt's family on proper set-up prior to initiating stair navigation. Pt ascended/descended 4 steps using R UE support on each HR with min assist for balance from therapist while educating pt's family on proper positioning and hand placement to provide patient assistance. Pt ascended/descneded 4 steps using R UE support with family  providing proper min assist with minimal progressed to no cuing from therapist. Pt ambulated ~67ft using RW with min assist for balance and cuing for improved L LE foot clearance and swing through to improve balance while educating pt's family on proper positioning and hand placement to provide assistance as well as importance of monitoring pt's L LE swing phase. Pt ambulated ~54ft with pt's daughter-in-law providing hands-on physical assistance with min cuing from therapist for proper assistance. Therapist educated pt's family on pt ambulating no more than ~66ft in the home using RW with pt's family providing proper assistance and attentive to pt's safety and L LE foot placement during gait. Transported back to room in w/c. Performed stand pivot transfer w/c>EOB using RW with pt's daughter-in-law properly setting up transfer and providing proper assistance with min cuing. Sit>supine with CGA from pt's daughter-in-law. Pt left supine in bed with needs in reach and bed alarm on.  Addendum: Pt reports her plan is to sleep in a power recliner at home as she has done this for years.  Therapy Documentation Precautions:  Precautions Precautions: Fall Precaution Comments: left hemiparesis Required Braces or Orthoses: Other Brace Other Brace: L AFO Restrictions Weight Bearing Restrictions: No  Pain:  No complaints of pain.    Therapy/Group: Individual Therapy  Tawana Scale, PT, DPT 03/28/2019, 7:58 AM

## 2019-03-28 NOTE — Progress Notes (Signed)
Occupational Therapy Session Note  Patient Details  Name: Ann Lopez MRN: [id number] Date of Birth: 06-17-1970  Today's Date: 03/28/2019 OT Individual Time: [id number] Individual Time Calculation (min): 70 min   Short Term Goals: Week 3:  OT Short Term Goal 1 (Week 3): Pt will continue working toward LTGs set a min assist or less.  Skilled Therapeutic Interventions/Progress Updates:    Pt greeted EOB with bed alarm going off. She needed to use the bathroom. After shoes and Lt orthosis were donned by OT, pt completed stand pivot<w/c with Min A using the RW. Note that her Lt hand become unstrapped from the orthotic and she needed A to fix this. Stand pivot<toilet completed using RW with the same assist. She was able to complete clothing mgt with Min balance assist. Hygiene completed while sitting post B+B void. At this time her daughter in law [place] arrived. Discussed her CLOF with toileting. Colletta [place] provided balance assist while pt elevated pants and assisted her via stand pivot back to w/c using RW. Vcs required for attending to L LE and to protect Lt hand if it came out of orthosis. Vcs also for correcting Lt lean in standing. Stand pivot>TTB completed with Min A by therapist. While pt showered, reviewed with [facility] techniques for Lt arm and setting ADL items on Lt side to promote Lt attention/scanning. Discussed joint protection during functional activity and demonstrated these techniques while pt engaged in grooming tasks at the sink before dressing. Colletta [place] had hands on practice with donning/doffing Lt AFO. She also provided balance assist while pt elevated LB garments over hips. Afterwards, escorted pt down to tub shower room via w/c. Demonstrated squat pivot TTB transfer for Colletta Maryland and also pts son. Each family member had practice with transferring pt in and out of shower. Reviewed purchase of nonslip shower treads and placement of shower curtain. At end of session pt  remained in w/c with family present, awaiting next therapist for more family education. Family aware that they cannot assist pt with transfers without therapy present.   Therapy Documentation Precautions:  Precautions Precautions: Fall Precaution Comments: left hemiparesis Required Braces or Orthoses: Other Brace Other Brace: L AFO Restrictions Weight Bearing Restrictions: No Vital Signs: Therapy Vitals Temp: 98.1 F (36.7 C) Pulse Rate: 86 Resp: 18 BP: 92/69 Patient Position (if appropriate): Lying Oxygen Therapy SpO2: 97 % O2 Device: Room Air Pain: No c/o pain during session    ADL: ADL Eating: Supervision/safety Where Assessed-Eating: Wheelchair Grooming: Minimal assistance Where Assessed-Grooming: Wheelchair Upper Body Bathing: Minimal assistance Where Assessed-Upper Body Bathing: Wheelchair Lower Body Bathing: Moderate assistance Where Assessed-Lower Body Bathing: Wheelchair Upper Body Dressing: Moderate assistance Where Assessed-Upper Body Dressing: Wheelchair Lower Body Dressing: Maximal assistance Where Assessed-Lower Body Dressing: Wheelchair Toileting: Moderate assistance Where Assessed-Toileting: Bedside Commode Toilet Transfer: Maximal assistance Toilet Transfer Method: Stand pivot Toilet Transfer Equipment: Bedside commode     Therapy/Group: Individual Therapy  Jeyla Bulger A Raylinn Kosar 03/28/2019, 4:28 PM

## 2019-03-28 NOTE — Progress Notes (Signed)
[place] PHYSICAL MEDICINE & REHABILITATION PROGRESS NOTE   Subjective/Complaints: Patient seen sitting up at the edge of her bed eating breakfast this morning.  She states she slept well overnight, confirmed with sleep chart.  Good sitting balance noted.  ROS: Denies CP, SOB, N/V/D  Objective:   No results found. No results for input(s): WBC, HGB, HCT, PLT in the last 72 hours. No results for input(s): NA, K, CL, CO2, GLUCOSE, BUN, CREATININE, CALCIUM in the last 72 hours.  Intake/Output Summary (Last 24 hours) at 03/28/2019 1620 Last data filed at 03/28/2019 1300 Gross per 24 hour  Intake 480 ml  Output --  Net 480 ml     Physical Exam: Vital Signs Blood pressure 92/69, pulse 86, temperature 98.1 F (36.7 C), resp. rate 18, height 5\' 3"  (1.6 m), weight 82 kg, SpO2 97 %. Constitutional: No distress . Vital signs reviewed. HENT: Normocephalic.  Atraumatic. Eyes: EOMI. No discharge. Cardiovascular: No JVD. Respiratory: Normal effort.  No stridor. GI: Non-distended. Skin: Warm and dry.  Intact. Psych: Normal mood.  Normal behavior. Musc: No edema in extremities.  No tenderness in extremities. Neuro: Alert Motor: RUE/RLE: 5/5 proximal distal LUE: 0/5 proximal distal LLE: Hip flexion, knee extension 4/5, ankle dorsiflexion 0/5  Assessment/Plan: 1. Functional deficits secondary to Right MCA infarct which require 3+ hours per day of interdisciplinary therapy in a comprehensive inpatient rehab setting.  Physiatrist is providing close team supervision and 24 hour management of active medical problems listed below.  Physiatrist and rehab team continue to assess barriers to discharge/monitor patient progress toward functional and medical goals  Care Tool:  Bathing    Body parts bathed by patient: Left arm, Chest, Abdomen, Right upper leg, Left upper leg, Right lower leg, Face, Front perineal area, Buttocks, Left lower leg   Body parts bathed by helper: Right arm      Bathing assist Assist Level: Minimal Assistance - Patient > 75%     Upper Body Dressing/Undressing Upper body dressing   What is the patient wearing?: Pull over shirt    Upper body assist Assist Level: Set up assist    Lower Body Dressing/Undressing Lower body dressing      What is the patient wearing?: Pants, Underwear/pull up     Lower body assist Assist for lower body dressing: Minimal Assistance - Patient > 75%     Toileting Toileting    Toileting assist Assist for toileting: Minimal Assistance - Patient > 75%     Transfers Chair/bed transfer  Transfers assist     Chair/bed transfer assist level: Minimal Assistance - Patient > 75%     Locomotion Ambulation   Ambulation assist   Ambulation activity did not occur: Safety/medical concerns  Assist level: Moderate Assistance - Patient 50 - 74% Assistive device: Walker-rolling Max distance: 167ft   Walk 10 feet activity   Assist     Assist level: Minimal Assistance - Patient > 75% Assistive device: Walker-rolling   Walk 50 feet activity   Assist Walk 50 feet with 2 turns activity did not occur: Safety/medical concerns  Assist level: Minimal Assistance - Patient > 75% Assistive device: Walker-rolling    Walk 150 feet activity   Assist Walk 150 feet activity did not occur: Safety/medical concerns  Assist level: Moderate Assistance - Patient - 50 - 74% Assistive device: Walker-rolling    Walk 10 feet on uneven surface  activity   Assist Walk 10 feet on uneven surfaces activity did not occur: Safety/medical concerns  Wheelchair     Assist Will patient use wheelchair at discharge?: (TBD) Type of Wheelchair: Manual    Wheelchair assist level: Minimal Assistance - Patient > 75% Max wheelchair distance: 150 ft    Wheelchair 50 feet with 2 turns activity    Assist        Assist Level: Minimal Assistance - Patient > 75%   Wheelchair 150 feet activity      Assist  Wheelchair 150 feet activity did not occur: Safety/medical concerns   Assist Level: Minimal Assistance - Patient > 75%   Blood pressure 92/69, pulse 86, temperature 98.1 F (36.7 C), resp. rate 18, height 5\' 3"  (1.6 m), weight 82 kg, SpO2 97 %.  Medical Problem List and Plan: 1.Left hemiparesis and functional deficitssecondary to right MCA infarct after right ICA,MCA, ACA occlusion. Pt s/p reperfusion/stenting---likely DM and smoking as main risk ractors   Continue CIR The 2. Antithrombotics: -DVT/anticoagulation:Pharmaceutical:Lovenoxadded 11/30.   Dopplers negative for DVT on 11/30 -antiplatelet therapy: On ASA/Brillita. 3.Chronic thoracic/ shoulder pain/Pain Management:On hydrocodone 10mg  QIDat home (went to pain clinic), currently on 5mg  q6h prn, does ok with this and would rec this  be d/c dose with f/u at her pain clinic   HA,controlled but needs topiramate  BID    Relatively controlled with medication on 12/19 4. Mood:LCSW to follow for evaluation and support.  -antipsychotic agents: N/A  -Sleep:    Dc seroquel per pt request   -try restoril 15mg  which she has used at home  Hx of depression and anxiety - appreciate neuropsych, on Cymbalta and Klonopin 5. Neuropsych: This patientiscapable of making decisions on herown behalf. 6. Skin/Wound Care:Routine pressure relief measures. 7. Fluids/Electrolytes/Nutrition:Monitor I/O 8. R-ICAthrombus s/pstent: On Brillinta and ASA. Monitor CBC serially. Avoid hypotension. 9. Leucocytosis: Resolved 10. Hypomagnesemia:  Magnesium 1.9 on 11/28  11. T2DM with hyperglycemia: Hgb A1C- 9.0. Was on lantus 40 units with meal coverage and invokana PTA.  CBG (last 3)  Recent Labs    03/27/19 2110 03/28/19 0627 03/28/19 1202  GLUCAP 129* 111* 171*   on Lantus 15U and Invokana controlled  12/18  Labile on 12/19 12. Left thyroid nodule/Pulmonary nodules: Per CT neck 03/02/2019.  Non-emergent thyroid ultrasound for follow up recommended.  13. COPD: On breo daily with albuterol prn.  14. Anxiety/depression with related insomnia : On Desvenlafaxine and clonazepam PTA?Now on Cymbalta 60 mg/day appreciate neuropsychology evaluation  Improved with restoril  13. Bradycardia: Resolved 14. No hx HTN, has historically run low BPs  Vitals:   03/28/19 0413 03/28/19 1304  BP: (!) 110/58 92/69  Pulse: 83 86  Resp: 18 18  Temp: 97.9 F (36.6 C) 98.1 F (36.7 C)  SpO2: 98% 97%   Soft on 12/19 15. Left breast cancer s/p bilateral mastectomy: 16.  Dysphagia related to CVA: Resolved 17.  Chronic constipation: Added senna and prn dulc supp   LOS: 19 days A FACE TO FACE EVALUATION WAS PERFORMED  [place] Reta Lorie Phenix 03/28/2019, 4:20 PM

## 2019-03-29 DIAGNOSIS — G441 Vascular headache, not elsewhere classified: Secondary | ICD-10-CM

## 2019-03-29 LAB — GLUCOSE, CAPILLARY
Glucose-Capillary: 104 mg/dL — ABNORMAL HIGH (ref 70–99)
Glucose-Capillary: 89 mg/dL (ref 70–99)
Glucose-Capillary: 162 mg/dL — ABNORMAL HIGH (ref 70–99)
Glucose-Capillary: 142 mg/dL — ABNORMAL HIGH (ref 70–99)

## 2019-03-29 NOTE — Progress Notes (Signed)
PHYSICAL MEDICINE & REHABILITATION PROGRESS NOTE   Subjective/Complaints: Patient seen laying in bed this morning.  She states she slept fairly overnight.  She is looking forward to discharge next week.  She has questions regarding follow-up appointments.  ROS: Denies CP, SOB, N/V/D  Objective:   No results found. No results for input(s): WBC, HGB, HCT, PLT in the last 72 hours. No results for input(s): NA, K, CL, CO2, GLUCOSE, BUN, CREATININE, CALCIUM in the last 72 hours.  Intake/Output Summary (Last 24 hours) at 03/29/2019 1242 Last data filed at 03/29/2019 0806 Gross per 24 hour  Intake 720 ml  Output -  Net 720 ml     Physical Exam: Vital Signs Blood pressure (!) 104/57, pulse 77, temperature 98 F (36.7 C), temperature source Oral, resp. rate 18, height 5\' 3"  (1.6 m), weight 82.5 kg, SpO2 99 %. Constitutional: No distress . Vital signs reviewed. HENT: Normocephalic.  Atraumatic. Eyes: EOMI. No discharge. Cardiovascular: No JVD. Respiratory: Normal effort.  No stridor. GI: Non-distended. Skin: Warm and dry.  Intact. Psych: Normal mood.  Normal behavior. Musc: No edema in extremities.  No tenderness in extremities. Neuro: Alert Motor: RUE/RLE: 5/5 proximal distal LUE: 0/5 proximal distal, unchanged LLE: Hip flexion, knee extension 4/5, ankle dorsiflexion 0/5, unchanged  Assessment/Plan: 1. Functional deficits secondary to Right MCA infarct which require 3+ hours per day of interdisciplinary therapy in a comprehensive inpatient rehab setting.  Physiatrist is providing close team supervision and 24 hour management of active medical problems listed below.  Physiatrist and rehab team continue to assess barriers to discharge/monitor patient progress toward functional and medical goals  Care Tool:  Bathing    Body parts bathed by patient: Left arm, Chest, Abdomen, Right upper leg, Left upper leg, Right lower leg, Face, Front perineal area, Buttocks, Left  lower leg   Body parts bathed by helper: Right arm     Bathing assist Assist Level: Minimal Assistance - Patient > 75%     Upper Body Dressing/Undressing Upper body dressing   What is the patient wearing?: Pull over shirt    Upper body assist Assist Level: Set up assist    Lower Body Dressing/Undressing Lower body dressing      What is the patient wearing?: Pants, Underwear/pull up     Lower body assist Assist for lower body dressing: Minimal Assistance - Patient > 75%     Toileting Toileting    Toileting assist Assist for toileting: Minimal Assistance - Patient > 75%     Transfers Chair/bed transfer  Transfers assist     Chair/bed transfer assist level: Minimal Assistance - Patient > 75%     Locomotion Ambulation   Ambulation assist   Ambulation activity did not occur: Safety/medical concerns  Assist level: Minimal Assistance - Patient > 75% Assistive device: Walker-rolling Max distance: 35ft   Walk 10 feet activity   Assist     Assist level: Minimal Assistance - Patient > 75% Assistive device: Walker-rolling   Walk 50 feet activity   Assist Walk 50 feet with 2 turns activity did not occur: Safety/medical concerns  Assist level: Minimal Assistance - Patient > 75% Assistive device: Walker-rolling    Walk 150 feet activity   Assist Walk 150 feet activity did not occur: Safety/medical concerns  Assist level: Moderate Assistance - Patient - 50 - 74% Assistive device: Walker-rolling    Walk 10 feet on uneven surface  activity   Assist Walk 10 feet on uneven surfaces activity did not  occur: Safety/medical concerns         Wheelchair     Assist Will patient use wheelchair at discharge?: (TBD) Type of Wheelchair: Manual    Wheelchair assist level: Minimal Assistance - Patient > 75% Max wheelchair distance: 150 ft    Wheelchair 50 feet with 2 turns activity    Assist        Assist Level: Minimal Assistance -  Patient > 75%   Wheelchair 150 feet activity     Assist  Wheelchair 150 feet activity did not occur: Safety/medical concerns   Assist Level: Minimal Assistance - Patient > 75%   Blood pressure (!) 104/57, pulse 77, temperature 98 F (36.7 C), temperature source Oral, resp. rate 18, height 5\' 3"  (1.6 m), weight 82.5 kg, SpO2 99 %.  Medical Problem List and Plan: 1.Left hemiparesis and functional deficitssecondary to right MCA infarct after right ICA,MCA, ACA occlusion. Pt s/p reperfusion/stenting---likely DM and smoking as main risk ractors   Continue CIR The 2. Antithrombotics: -DVT/anticoagulation:Pharmaceutical:Lovenoxadded 11/30.   Dopplers negative for DVT on 11/30 -antiplatelet therapy: On ASA/Brillita. 3.Chronic thoracic/ shoulder pain/Pain Management:On hydrocodone 10mg  QIDat home (went to pain clinic), currently on 5mg  q6h prn, does ok with this and would rec this  be d/c dose with f/u at her pain clinic   HA, cont topiramate  BID - controlled on 12/20  Relatively controlled with medication on 12/20 4. Mood:LCSW to follow for evaluation and support.  -antipsychotic agents: N/A  -Sleep:    Dc seroquel per pt request   Restoril 15mg  which she has used at home  Hx of depression and anxiety - appreciate neuropsych, on Cymbalta and Klonopin 5. Neuropsych: This patientiscapable of making decisions on herown behalf. 6. Skin/Wound Care:Routine pressure relief measures. 7. Fluids/Electrolytes/Nutrition:Monitor I/O 8. R-ICAthrombus s/pstent: On Brillinta and ASA. Monitor CBC serially. Avoid hypotension. 9. Leucocytosis: Resolved 10. Hypomagnesemia:  Magnesium 1.9 on 11/28  11. T2DM with hyperglycemia: Hgb A1C- 9.0. Was on lantus 40 units with meal coverage and invokana PTA.  CBG (last 3)  Recent Labs    03/28/19 2126 03/29/19 0613 03/29/19 1130  GLUCAP 157* 104* 89   on Lantus 15U and Invokana controlled  12/18  Slightly  labile on 12/20 12. Left thyroid nodule/Pulmonary nodules: Per CT neck 03/02/2019. Non-emergent thyroid ultrasound for follow up recommended.  13. COPD: On breo daily with albuterol prn.  14. Anxiety/depression with related insomnia : On Desvenlafaxine and clonazepam PTA?Now on Cymbalta 60 mg/day appreciate neuropsychology evaluation  Improved with restoril  13. Bradycardia: Resolved 14. No hx HTN, has historically run low BPs  Vitals:   03/28/19 1947 03/29/19 0438  BP: 91/65 (!) 104/57  Pulse: 78 77  Resp: 16 18  Temp: 98.5 F (36.9 C) 98 F (36.7 C)  SpO2: 99% 99%   Soft on 12/20 15. Left breast cancer s/p bilateral mastectomy: 16.  Dysphagia related to CVA: Resolved 17.  Chronic constipation: Added senna and prn dulc supp   LOS: 20 days A FACE TO FACE EVALUATION WAS PERFORMED  Mostafa Yuan Lorie Phenix 03/29/2019, 12:42 PM

## 2019-03-30 ENCOUNTER — Inpatient Hospital Stay (HOSPITAL_COMMUNITY): Payer: Medicare Other | Admitting: Occupational Therapy

## 2019-03-30 ENCOUNTER — Encounter (HOSPITAL_COMMUNITY): Payer: Medicare Other | Admitting: Psychology

## 2019-03-30 ENCOUNTER — Ambulatory Visit (HOSPITAL_COMMUNITY): Payer: Medicare Other

## 2019-03-30 ENCOUNTER — Inpatient Hospital Stay (HOSPITAL_COMMUNITY): Payer: Medicare Other

## 2019-03-30 ENCOUNTER — Inpatient Hospital Stay (HOSPITAL_COMMUNITY): Payer: Medicare Other | Admitting: *Deleted

## 2019-03-30 LAB — BASIC METABOLIC PANEL
Anion gap: 9 (ref 5–15)
BUN: 13 mg/dL (ref 6–20)
CO2: 23 mmol/L (ref 22–32)
Calcium: 9.5 mg/dL (ref 8.9–10.3)
Chloride: 106 mmol/L (ref 98–111)
Creatinine, Ser: 0.75 mg/dL (ref 0.44–1.00)
GFR calc Af Amer: >60 mL/min (ref >60)
GFR calc non Af Amer: >60 mL/min (ref >60)
Glucose, Bld: 131 mg/dL — ABNORMAL HIGH (ref 70–99)
Potassium: 3.9 mmol/L (ref 3.5–5.1)
Sodium: 138 mmol/L (ref 135–145)

## 2019-03-30 LAB — CBC
HCT: 47.7 % — ABNORMAL HIGH (ref 36.0–46.0)
Hemoglobin: 15.6 g/dL — ABNORMAL HIGH (ref 12.0–15.0)
MCH: 29.2 pg (ref 26.0–34.0)
MCHC: 32.7 g/dL (ref 30.0–36.0)
MCV: 89.2 fL (ref 80.0–100.0)
Platelets: 302 10*3/uL (ref 150–400)
RBC: 5.35 MIL/uL — ABNORMAL HIGH (ref 3.87–5.11)
RDW: 13.3 % (ref 11.5–15.5)
WBC: 6.7 10*3/uL (ref 4.0–10.5)
nRBC: 0 % (ref 0.0–0.2)

## 2019-03-30 LAB — GLUCOSE, CAPILLARY
Glucose-Capillary: 115 mg/dL — ABNORMAL HIGH (ref 70–99)
Glucose-Capillary: 101 mg/dL — ABNORMAL HIGH (ref 70–99)
Glucose-Capillary: 100 mg/dL — ABNORMAL HIGH (ref 70–99)
Glucose-Capillary: 158 mg/dL — ABNORMAL HIGH (ref 70–99)

## 2019-03-30 NOTE — Progress Notes (Signed)
Occupational Therapy Session Note  Patient Details  Name: LATESHA KEEL MRN: [id number] Date of Birth: 1970/06/09  Today's Date: 03/30/2019 OT Individual Time: [id number] OT Individual Time Calculation (min): 35 min    Short Term Goals: Week 3:  OT Short Term Goal 1 (Week 3): Pt will continue working toward LTGs set a min assist or less.  Skilled Therapeutic Interventions/Progress Updates:    Patient lying in bed, states that the extra 10 minutes helped and she feels like HA is subsiding.  Bed mobility with CS, SPT bed to w/c CGA.  Completed dynavision in seated position bottom half of screen with right hand (trial 1 = 1.43 sec trial 2 = 1.0 sec - left side equal to right) Completed standing balance and posture activities with bean bag toss - good accuracy and trunk control with throwing task, needs CGA to maintain balance. Returned to bed at close of session with CGA.  She states that she feels ready for discharge to home with family tomorrow.  Bed alarm set and call bell in reach.    Therapy Documentation Precautions:  Precautions Precautions: Fall Precaution Comments: left hemiparesis Required Braces or Orthoses: Other Brace Other Brace: L AFO Restrictions Weight Bearing Restrictions: No General: General OT Amount of Missed Time: 10 Minutes Vital Signs: Therapy Vitals Temp: 98.7 F (37.1 C) Pulse Rate: 92 Resp: 16 BP: 108/79 Patient Position (if appropriate): Lying Oxygen Therapy SpO2: 99 % O2 Device: Room Air Pain: Pain Assessment Pain Scale: 0-10 Pain Score: 6  Pain Type: Chronic pain Pain Location: Head Pain Orientation: Lower Pain Descriptors / Indicators: Aching Pain Frequency: Constant Pain Onset: On-going Patients Stated Pain Goal: 2 Pain Intervention(s): Repositioned;Rest Other Treatments:     Therapy/Group: Individual Therapy  Carlos Levering 03/30/2019, 3:40 PM

## 2019-03-30 NOTE — Progress Notes (Signed)
Recreational Therapy Session Note  Patient Details  Name: Ann Lopez MRN: [id number] Date of Birth: 1970-11-07 Today's Date: 03/30/2019 Time: 9-930 Pain:  No c/o Skilled Therapeutic Interventions/Progress Updates: Pt in the bathroom upon arrival.  Pt performed sit-stand with STEDY with contact guard assist, min assist to pull up pants.  Transitioned to w/c with STEDY.  Remainder of session focused on discharge planning with emphasis on community reintegration as she states plans to attend her son's soccer games after the first of the year.  "I'll be there, even if it has to be w/c bound."  Discussed potential obstacles and how to negotiate them, specifically public restroom access.  Pt stated understanding.  Therapy/Group: Individual Therapy Argenis Kumari 03/30/2019, 3:51 PM

## 2019-03-30 NOTE — Progress Notes (Signed)
Physical Therapy Discharge Summary  Patient Details  Name: Ann Lopez MRN: [id number] Date of Birth: 06/15/1970  Today's Date: 03/30/2019 Ann Lopez Individual Time: 2774-1287 and 1630-1700 Ann Lopez Individual Time Calculation (min): 59 min and 30 min      Patient has met 9 of 9 long term goals due to improved activity tolerance, improved balance, improved postural control, increased strength, ability to compensate for deficits, functional use of  left upper extremity and left lower extremity, improved attention and improved awareness.  Patient to discharge at an ambulatory level Ann Lopez.   Patient's care partner is independent to provide the necessary physical assistance at discharge. Patients family members have all completed hands on training and are safe to perform transfers with patient at home and assist Ann Lopez with ambulation during intentional practice time.   All goals met.   Recommendation:  Patient will benefit from ongoing skilled Ann Lopez services in home health setting to continue to advance safe functional mobility, address ongoing impairments in balance, strength, attention, awareness, and minimize fall risk.  Equipment: w/c and RW  Reasons for discharge: treatment goals met  Patient/family agrees with progress made and goals achieved: Yes   Ann Lopez Treatment Interventions: Session 1: Ann Lopez's family present this session for family education. Ann Lopez seated in w/c upon Ann Lopez arrival, agreeable to therapy tx and denies pain. Donned L AFO and shoes with education provided to family on how to don. Ann Lopez propelled w/c x 150 ft to the gym with supervision using R hemi technique. Ann Lopez ambulated x 150 ft with min assist from therapist and RW, cues for step length and awareness to L side, cues for safety. Therapist showed family how Ann Lopez is ambulating but emphasized that the patient should only be ambulating about 20-30 ft of intentional ambulation practice when at home with family. Ann Lopez ambulated x 20 ft this session with  assist from son and x 20 ft with assist from daughter in law, Ann Lopez with L lateral LOB during this and decreased L awareness, therapist provided education for ambulation safety at home and fall prevention. Ann Lopez performed car transfer stand pivot with min assist and RW, cues for attention to L and techniques. Ann Lopez transported to the gym. Ann Lopez performed x 2 stand pivot transfers from w/c<>mat with RW and CGA with assist from family, education on techniques and sequencing. Ann Lopez ascended/descended 4 steps this session with R rail and min assist, step to pattern, x 1 with assist from therapist providing education on techniques and sequencing, performed x 1 with assist from her son. Ann Lopez transported back to room and left in w/c with needs in reach and chair alarm set.   Session 2: Ann Lopez seated EOB upon Ann Lopez arrival, agreeable to therapy tx and denies pain . Ann Lopez performed stand pivot to w/c with CGA and performed w/c propulsion x 200 ft community distance off the unit. Picked up patients clothes from main entrance, transported back to unit. Ann Lopez ambulated 2 x 55 ft this session with RW and min assist, cues for step length, foot placement and RW management. Discussed safety and fall prevention at home, discussed mobility prognosis and continued therapy expectations. Ann Lopez transported back to room and transferred to bed CGA, left seated with needs in reach and bed alarm set.    Ann Lopez Discharge Precautions/Restrictions Precautions Precautions: Fall Precaution Comments: left hemiparesis Required Braces or Orthoses: Other Brace Other Brace: L AFO Restrictions Weight Bearing Restrictions: No Vital Signs Therapy Vitals Temp: 97.9 F (36.6 C) Temp Source: Oral Pulse Rate: 76 Resp:  18 BP: (!) 95/56 Patient Position (if appropriate): Standing Oxygen Therapy SpO2: 98 % O2 Device: Room Air Pain Pain Assessment Pain Scale: 0-10 Pain Score: 6  Pain Type: Chronic pain Pain Location: Head Pain Orientation: Lower Pain Descriptors /  Indicators: Aching Pain Frequency: Constant Pain Onset: On-going Patients Stated Pain Goal: 2 Pain Intervention(s): Repositioned;Rest Cognition Overall Cognitive Status: Impaired/Different from baseline Arousal/Alertness: Awake/alert Orientation Level: Oriented X4 Attention: Selective Focused Attention: Appears intact Sustained Attention: Appears intact Selective Attention: Impaired Memory: Impaired Memory Impairment: Storage deficit;Decreased recall of new information Awareness: Impaired Awareness Impairment: Anticipatory impairment Problem Solving: Appears intact Safety/Judgment: Impaired Sensation Sensation Light Touch: Appears Intact Hot/Cold: Appears Intact Proprioception: Impaired Detail Stereognosis: Not tested Additional Comments: proprioception impaired L LE Coordination Gross Motor Movements are Fluid and Coordinated: No Fine Motor Movements are Fluid and Coordinated: No Coordination and Movement Description: Ann Lopez currently needs max hand over hand for integration of the LUE into selfcare tasks. Heel Shin Test: L LE impaired due to paresis Motor  Motor Motor - Discharge Observations: Still with moderate LUE and LLE hemiparesis  Mobility Bed Mobility Bed Mobility: Supine to Sit;Sit to Supine Supine to Sit: Supervision/Verbal cueing Sit to Supine: Supervision/Verbal cueing Transfers Transfers: Sit to Stand;Stand Pivot Transfers Sit to Stand: Supervision/Verbal cueing Stand Pivot Transfers: Contact Guard/Touching assist Stand Pivot Transfer Details: Verbal cues for technique;Verbal cues for precautions/safety;Verbal cues for safe use of DME/AE Transfer (Assistive device): Rolling walker Locomotion  Gait Ambulation: Yes Gait Assistance: Minimal Assistance - Patient > 75% Gait Distance (Feet): 150 Feet Assistive device: Rolling walker Gait Assistance Details: Tactile cues for sequencing;Tactile cues for weight shifting;Manual facilitation for weight  shifting;Manual facilitation for placement;Verbal cues for gait pattern;Verbal cues for precautions/safety;Verbal cues for sequencing;Verbal cues for technique Gait Gait: Yes Gait Pattern: Impaired Gait Pattern: Decreased weight shift to left;Poor foot clearance - left;Decreased dorsiflexion - left;Decreased stance time - left;Decreased step length - left;Decreased step length - right;Narrow base of support Gait velocity: decreased Stairs / Additional Locomotion Stairs: Yes Stairs Assistance: Minimal Assistance - Patient > 75% Stair Management Technique: One rail Right Number of Stairs: 46 Height of Stairs: 6 Wheelchair Mobility Wheelchair Mobility: Yes Wheelchair Assistance: Chartered loss adjuster: Right upper extremity;Right lower extremity Wheelchair Parts Management: Needs assistance Distance: 150  Trunk/Postural Assessment  Cervical Assessment Cervical Assessment: Exceptions to WFL(forward head posture) Thoracic Assessment Thoracic Assessment: Exceptions to WFL(rounded shoulders) Lumbar Assessment Lumbar Assessment: Exceptions to WFL(posterior pelvic tilt sitting) Postural Control Postural Control: Deficits on evaluation Trunk Control: decreased with LOB to the left Protective Responses: impaired Postural Limitations: decreased  Balance Balance Balance Assessed: Yes Static Sitting Balance Static Sitting - Balance Support: Feet supported Static Sitting - Level of Assistance: 7: Independent Dynamic Sitting Balance Dynamic Sitting - Balance Support: During functional activity Dynamic Sitting - Level of Assistance: 5: Stand by assistance Static Standing Balance Static Standing - Balance Support: During functional activity;Bilateral upper extremity supported Static Standing - Level of Assistance: 5: Stand by assistance Dynamic Standing Balance Dynamic Standing - Balance Support: During functional activity Dynamic Standing - Level of Assistance: 4:  Min assist Extremity Assessment  RLE Assessment RLE Assessment: Within Functional Limits General Strength Comments: Grossly 4+/5 to 5/5 throughout assessed in sitting LLE Assessment LLE Assessment: Exceptions to [facility] Passive Range of Motion (PROM) Comments: L ankle rests in inversion, DF to neutral LLE Strength Left Hip Flexion: 3+/5 Left Knee Flexion: 3+/5 Left Knee Extension: 4-/5 Left Ankle Dorsiflexion: 0/5 Left Ankle Plantar Flexion: 2/5  Netta Corrigan, Ann Lopez, DPT, CSRS 03/30/2019, 7:53 AM

## 2019-03-30 NOTE — Progress Notes (Signed)
Occupational Therapy Discharge Summary  Patient Details  Name: Ann Lopez MRN: [id number] Date of Birth: 08/27/1970  Today's Date: 03/30/2019 OT Individual Time: 1100-1208 OT Individual Time Calculation (min): 31 min   Session Note:  Pt's son and daughter-in-law in for family education this session.  She was able to complete sit to stand transitions, stand pivot transfers, and squat pivot transfers with min to min guard assist.  Both the son and daughter-in-law both assist with transfers, however the daughter in law was not consistent with stand pivot transfers without an assistive device, so recommended squat pivot if pt does not have the RW available or is transferring into or out of the shower.  [name] completed toilet transfer stand pivot with min assist from therapist.  Slight assist needed to advance the LLE to the correct position at times.  She was able to complete all bathing in sitting with supervision including lateral leans to the side to wash her buttocks and peri area.  Pt's daughter-in-law assisted with hand over hand for washing the RUE with the hemi paretic LUE as well as for holding her deodorant and helping her to apply it.  She was able to complete all dressing sit to stand at the sink with overall supervision for donning pullover shirt and min assist for underpants, pants, and socks.  She needed mod assist for donning the left shoe and AFO but was able to donn the right shoe with supervision and laces already tied.  Educated son and daughter-in-law on donning resting hand splint as well as for AAROM exercises for the LUE with handout provided.  Pt left in the wheelchair at end of session with call button and phone in reach and safety belt in place.     Patient has met 8 of 15 long term goals due to improved balance, postural control, ability to compensate for deficits, functional use of  LEFT upper and LEFT lower extremity, improved attention, improved awareness and improved  coordination.  Patient to discharge at [facility] Assist level.  Patient's care partner is independent to provide the necessary physical and cognitive assistance at discharge.    Reasons goals not met: Pt still needs min assist for awareness, toilet transfers and toileting, tub transfers, dynamic standing balance during selfcare tasks.   Recommendation:  Patient will benefit from ongoing skilled OT services in home health setting to continue to advance functional skills in the area of BADL and Reduce care partner burden.  Pt continues to demonstrate significant LUE and LLE hemiparesis as well as slight inattention and decreased anticipatory awareness.  Feel she will benefit from [facility] to progress to modified independent level with selfcare tasks at this time.  Recommend initial 24 hr supervision for safety.   Equipment: tub bench and 3:1  Reasons for discharge: treatment goals met and discharge from hospital  Patient/family agrees with progress made and goals achieved: Yes  OT Discharge Precautions/Restrictions  Precautions Precautions: Fall Precaution Comments: left hemiparesis Required Braces or Orthoses: Other Brace Other Brace: L AFO Restrictions Weight Bearing Restrictions: No  Pain Pain Assessment Pain Scale: 0-10 Pain Score: 6  Pain Type: Chronic pain Pain Location: Head Pain Orientation: Lower Pain Descriptors / Indicators: Aching Pain Frequency: Constant Pain Onset: On-going Patients Stated Pain Goal: 2 Pain Intervention(s): Repositioned;Rest ADL ADL Eating: Independent Where Assessed-Eating: Wheelchair Grooming: Independent Where Assessed-Grooming: Wheelchair Upper Body Bathing: Supervision/safety Where Assessed-Upper Body Bathing: Shower Lower Body Bathing: Supervision/safety(lateral lean for washing peri area and buttocks) Where Assessed-Lower Body  Bathing: Administrator, sports Dressing: Independent Where Assessed-Upper Body Dressing: Wheelchair Lower Body  Dressing: Minimal assistance Where Assessed-Lower Body Dressing: Standing at sink, Sitting at sink, Wheelchair Toileting: Minimal assistance Where Assessed-Toileting: Bedside Commode Toilet Transfer: Minimal assistance Armed forces technical officer Method: Arts development officer: Radiographer, therapeutic: Minimal Museum/gallery conservator Method: Engineer, production: Facilities manager: Environmental education officer Method: Radiographer, therapeutic: Gaffer Baseline Vision/History: Wears glasses Wears Glasses: Distance only Patient Visual Report: No change from baseline Vision Assessment?: Yes Eye Alignment: Within Functional Limits Ocular Range of Motion: Within Functional Limits Alignment/Gaze Preference: Within Defined Limits Tracking/Visual Pursuits: Decreased smoothness of horizontal tracking;Decreased smoothness of vertical tracking Visual Fields: No apparent deficits Perception  Perception: Impaired Inattention/Neglect: Does not attend to left side of body(mild left inattention with pt not correcting LUE when it falls off of her lap) Praxis Praxis: Intact Cognition Overall Cognitive Status: Impaired/Different from baseline Arousal/Alertness: Awake/alert Orientation Level: Oriented X4 Attention: Selective Focused Attention: Appears intact Sustained Attention: Appears intact Selective Attention: Impaired Memory: Impaired Memory Impairment: Storage deficit;Decreased recall of new information Awareness: Impaired Awareness Impairment: Anticipatory impairment Problem Solving: Appears intact Safety/Judgment: Impaired Sensation Sensation Light Touch: Appears Intact Hot/Cold: Appears Intact Proprioception: Appears Intact Stereognosis: Not tested Additional Comments: Light touch and proprioception intact in BUEs Coordination Gross Motor Movements are Fluid and Coordinated:  No Fine Motor Movements are Fluid and Coordinated: No Coordination and Movement Description: Pt currently needs max hand over hand for integration of the LUE into selfcare tasks. Motor  Motor Motor - Discharge Observations: Still with moderate LUE and LLE hemiparesis Mobility  Transfers Sit to Stand: Contact Guard/Touching assist  Trunk/Postural Assessment  Cervical Assessment Cervical Assessment: Exceptions to WFL(cervical protraction) Thoracic Assessment Thoracic Assessment: Exceptions to WFL(thoracic rounding with passive left trunk elongation and left side shortening) Lumbar Assessment Lumbar Assessment: Exceptions to WFL(posterior pelvic tilt at rest)  Balance Balance Balance Assessed: Yes Static Sitting Balance Static Sitting - Balance Support: Feet supported Static Sitting - Level of Assistance: 7: Independent Dynamic Sitting Balance Dynamic Sitting - Balance Support: During functional activity Dynamic Sitting - Level of Assistance: 5: Stand by assistance Static Standing Balance Static Standing - Balance Support: During functional activity;Bilateral upper extremity supported Static Standing - Level of Assistance: Other (comment)(min contact guard) Dynamic Standing Balance Dynamic Standing - Balance Support: During functional activity Dynamic Standing - Level of Assistance: 4: Min assist Extremity/Trunk Assessment RUE Assessment RUE Assessment: Within Functional Limits LUE Assessment LUE Assessment: Exceptions to [facility] Passive Range of Motion (PROM) Comments: WFLs for all joints General Strength Comments: Pt needs max hand over hand for functional use as an active assist for bathing.  One finger inferior anterior subluxation is present. LUE Body System: Neuro Brunstrum levels for arm and hand: Arm;Hand Brunstrum level for arm: Stage III Synergy is performed voluntarily Brunstrum level for hand: Stage III Synergies performed voluntarily LUE Tone LUE Tone:  Mild   Gale Hulse OTR/L 03/30/2019, 4:47 PM

## 2019-03-30 NOTE — Consult Note (Addendum)
Neuropsychological Consultation   Patient:   Ann Lopez   DOB:   05/17/1970  MR Number:  [id number]  Location:  [facility] [facility] [place] [id number] [place] [address] Dept: [place]: [phone]           Date of Service:   03/30/2019  Start Time:   8 AM End Time:   9 AM  Provider/Observer:  Ilean Skill, Psy.D.       Clinical Neuropsychologist       Billing Code/Service: 96158/[id number]  Chief Complaint:    Ann Lopez is a 48 year old female with a history of type 2 diabetes, hypertension, gastric sleeve, breast cancer with bilateral mastectomy with BRCA gene and strong family history of BRCA gene.  The patient also describes late effects of mild cognitive impairments memory impairments from chemotherapy, tobacco use, migraines, back pain.  The patient was admitted on 03/01/2019 after a fall from the commode and found to have left-sided weakness with left neglect and right gaze preference.  Patient with UDS positive for opioids and amphetamines.  Patient denies amphetamine use and may be FP. CT head has shown infarction right lentiform nucleus and resolving confluent petechial hemorrhae and persistent partial effacement of right lateral ventricle.  Felt to have been embolic in nature due to large vessel stenosis.  Patient with residual left facial droop and cognitive deficits, left sided weakness (improving left leg) and improved right side gaze preference.  The patient has been improving during rehab and is made significant improvements in left leg function.  She continues to have significant motor deficits in her left arm.  The patient reports that she has had reduction in panic events but they continue.  Reports 1 yesterday.  Worked on Breathing exercises that she was taught and worked on sitting up in bed with side rail down, which she feels helps.    Reason for Service:  Ann Ann Cruel  Lopez is a 48 year old female with history of T2DM, HTN, gastricsleeve, breast cancer s/p bilateral mastectomywith TAH/BSOand post chemo memory deficits, tobacco use, migraines, back pain who was admitted on 03/02/19 after fall from the commode and found to have left sided weakness with left neglect and right gaze preference. UDS positive with opioids and amphetamines. CTA/P head revealed near occlusive thrombus within R-ICA terminus extending into M1 R-MCA with A1 R-ACA with core infarct in R-MCA and large penumbra as well as incidental finding of left thyroid nodule. She was taken for cerebral angio with emergent thrombectomy and complete revascularization of occluded R- ICA terminus, R-MCA and R-ACA with stent assisted angioplasty of symptomatic proximal R-ICA stenosis by DR. Deveshwar. Post procedure with hypotension felt to be due to carotid manipulation as well as decrease in LOC with increase in LLE tone. EEG showed diffuse encephalopathy and repeat CT head showed some mass effect on right lateral ventricle and was negative for hemorrhage.   She tolerated extubation without difficulty but had bouts of agitation alternating with lethargy. 2D echo showed EF 60-65% with no LVH and no wall abnormality. Most recent CT head 11/25 showed residual hyperdensity within infarcted right lentiform nucleus c/w resolving confluent petechial hemorrhage and persistent partial effacement of right lateral ventricle. Dr. Erlinda Hong felt that stroke embolic due to large vessel stenosis but PAF not fully ruled out--30 day cardiac event monitor recommended on outpatient basis. Neurology recommending SBP goal 120-140--hypotensive today with SBP in 90's thereforetreated with fluid bolus.Patient with resultant  left facial droop with cognitive deficits, left sided weakness and right gaze preference with left neglect affecting mobility and ADLs. CIR recommended due to functional decline.  Current Status:  Patient reports that  she has improved with her standing and able to use walker.  Reduced panic events but continue.  Working on Radiographer, therapeutic.  Patient do be discharged tomorrow and looking forward to seeing her children and hoping helps further with anxiety.      Behavioral Observation: Ann Lopez  presents as a 48 y.o.-year-old Right Caucasian Female who appeared her stated age. her dress was Appropriate and she was Well Groomed and her manners were Appropriate to the situation.  her participation was indicative of Appropriate and Attentive behaviors.  There were any physical disabilities noted.  she displayed an appropriate level of cooperation and motivation.     Interactions:    Active Appropriate and Redirectable  Attention:   abnormal and attention span appeared shorter than expected for age but improving  Memory:   abnormal; remote memory intact, recent memory impaired, improving  Visuo-spatial:  not examined but indications of visual neglect  Speech (Volume):  normal  Speech:   normal; normal  Thought Process:  Coherent and Relevant  Though Content:  WNL; not suicidal and not homicidal  Orientation:   person, place, time/date and situation  Judgment:   Fair  Planning:   Fair  Affect:    Appropriate  Mood:    Anxious with history of significant depressive events.  Insight:   Fair  Intelligence:   normal   Medical History:   Past Medical History:  Diagnosis Date  . Allergy   . Anxiety   . Asthma   . Back pain   . Breast cancer ([place])   . Cancer ([place])   . Depression   . Diabetes mellitus   . Headache   . Hyperlipemia   . Osteopenia   . Osteoporosis    osteopenia        Psychiatric History:  Past history of depression and anxiety.    Family Med/Psych History:  Family History  Problem Relation Age of Onset  . Cancer Mother 25       breast cancer  . Hypertension Sister   . Hypothyroidism Sister   . Cancer Sister   . Hypothyroidism Brother   . Cancer Maternal  Grandmother 65       breast cancer  . Cancer Maternal Grandfather 55       pancreatic cancer    Impression/DX:  Ann Lopez is a 48 year old female with a history of type 2 diabetes, hypertension, gastric sleeve, breast cancer with bilateral mastectomy with BRCA gene and strong family history of BRCA gene.  The patient also describes late effects of mild cognitive impairments memory impairments from chemotherapy, tobacco use, migraines, back pain.  The patient was admitted on 03/01/2019 after a fall from the commode and found to have left-sided weakness with left neglect and right gaze preference.  Patient with UDS positive for opioids and amphetamines.  Patient denies amphetamine use and may be FP. CT head has shown infarction right lentiform nucleus and resolving confluent petechial hemorrhae and persistent partial effacement of right lateral ventricle.  Felt to have been embolic in nature due to large vessel stenosis.  Patient with residual left facial droop and cognitive deficits, left sided weakness (improving left leg) and improved right side gaze preference.  More recently, the patient has been having what she describes as panic events.  She reports that she had multiple panic events last night and 2 panic events today.  Some of these appear to be significant anxiety states rather than panic but she does describe a couple of events that would have been consistent with full-blown panic attacks.  The patient is already taking some benzo diazepam and there is concerns about going up any further with benzodiazepine.  Patient reports that she has improved with her standing and able to use walker.  Reduced panic events but continue.  Working on Radiographer, therapeutic.  Patient do be discharged tomorrow and looking forward to seeing her children and hoping helps further with anxiety.    Disposition/Plan:  Patient is being discharged tomorrow and in good spirits.  She has a therapist outpatient and will be  seeing again 1 per week.        Electronically Signed   _______________________ Ilean Skill, Psy.D.

## 2019-03-30 NOTE — Progress Notes (Addendum)
Social Work Discharge Note   The overall goal for the admission was met for:   Discharge location: [place] HIS WIFE-STEPHANIE-24 HR CARE  Length of Stay: Yes-22 DAYS  Discharge activity level: Yes-MIN-CGA LEVEL  Home/community participation: Yes  Services provided included: MD, RD, PT, OT, SLP, RN, CM, TR, Pharmacy, Neuropsych and SW  Financial Services: Medicaid and Private Insurance: [employer]-MEDICARE  Follow-up services arranged: Home Health: [facility],OT,SP, DME: [facility], [facility], [facility] and Patient/Family has no preference for HH/DME agencies cancelled spt no need for follow up  Comments (or additional information):[place] [place]. ALL COMFORTABLE WITH PT'S CARE AND SHE IS READY TO GO HOME. WILL HAVE 24 HR CARE.  Patient/Family verbalized understanding of follow-up arrangements: Yes  Individual responsible for coordination of the follow-up plan: JESUS-SON, JUSTIN-SON AND STEPHANIE-DAUGHTER IN-LAW  Confirmed correct DME delivered: Elease Hashimoto 03/30/2019    Elease Hashimoto

## 2019-03-30 NOTE — Progress Notes (Signed)
Social Work Patient ID: Ann Lopez, female   DOB: October 30, 1970, 48 y.o.   MRN: [id number] Has had multiple family members come in for education over the weekend and today to learn pt's care and feel comfortable with care for discharge tomorrow. Follow up set up and equipment ordered.

## 2019-03-30 NOTE — Progress Notes (Signed)
[name] PHYSICAL MEDICINE & REHABILITATION PROGRESS NOTE   Subjective/Complaints:  Discussed MD f/u at [facility] for PCP Oldest son will be coming in today   ROS: Denies CP, SOB, N/V/D  Objective:   No results found. Recent Labs    03/30/19 0719  WBC 6.7  HGB 15.6*  HCT 47.7*  PLT 302   Recent Labs    03/30/19 0719  NA 138  K 3.9  CL 106  CO2 23  GLUCOSE 131*  BUN 13  CREATININE 0.75  CALCIUM 9.5    Intake/Output Summary (Last 24 hours) at 03/30/2019 [id number] Last data filed at 03/29/2019 1901 Gross per 24 hour  Intake 720 ml  Output --  Net 720 ml     Physical Exam: Vital Signs Blood pressure (!) 95/56, pulse 76, temperature 97.9 F (36.6 C), temperature source Oral, resp. rate 18, height 5\' 3"  (1.6 m), weight 82.5 kg, SpO2 98 %. Constitutional: No distress . Vital signs reviewed. HENT: Normocephalic.  Atraumatic. Eyes: EOMI. No discharge. Cardiovascular: No JVD. Respiratory: Normal effort.  No stridor. GI: Non-distended. Skin: Warm and dry.  Intact. Psych: Normal mood.  Normal behavior. Musc: No edema in extremities.  No tenderness in extremities. Neuro: Alert Motor: RUE/RLE: 5/5 proximal distal LUE: 0/5 proximal distal, unchanged LLE: Hip flexion, knee extension 4/5, ankle dorsiflexion 0/5, unchanged  Assessment/Plan: 1. Functional deficits secondary to Right MCA infarct which require 3+ hours per day of interdisciplinary therapy in a comprehensive inpatient rehab setting.  Physiatrist is providing close team supervision and 24 hour management of active medical problems listed below.  Physiatrist and rehab team continue to assess barriers to discharge/monitor patient progress toward functional and medical goals  Care Tool:  Bathing    Body parts bathed by patient: Left arm, Chest, Abdomen, Right upper leg, Left upper leg, Right lower leg, Face, Front perineal area, Buttocks, Left lower leg   Body parts bathed by helper: Right arm      Bathing assist Assist Level: Minimal Assistance - Patient > 75%     Upper Body Dressing/Undressing Upper body dressing   What is the patient wearing?: Pull over shirt    Upper body assist Assist Level: Set up assist    Lower Body Dressing/Undressing Lower body dressing      What is the patient wearing?: Pants, Underwear/pull up     Lower body assist Assist for lower body dressing: Minimal Assistance - Patient > 75%     Toileting Toileting    Toileting assist Assist for toileting: Minimal Assistance - Patient > 75%     Transfers Chair/bed transfer  Transfers assist     Chair/bed transfer assist level: Minimal Assistance - Patient > 75%     Locomotion Ambulation   Ambulation assist   Ambulation activity did not occur: Safety/medical concerns  Assist level: Minimal Assistance - Patient > 75% Assistive device: Walker-rolling Max distance: 77ft   Walk 10 feet activity   Assist     Assist level: Minimal Assistance - Patient > 75% Assistive device: Walker-rolling   Walk 50 feet activity   Assist Walk 50 feet with 2 turns activity did not occur: Safety/medical concerns  Assist level: Minimal Assistance - Patient > 75% Assistive device: Walker-rolling    Walk 150 feet activity   Assist Walk 150 feet activity did not occur: Safety/medical concerns  Assist level: Moderate Assistance - Patient - 50 - 74% Assistive device: Walker-rolling    Walk 10 feet on uneven surface  activity   Assist Walk 10 feet on uneven surfaces activity did not occur: Safety/medical concerns         Wheelchair     Assist Will patient use wheelchair at discharge?: (TBD) Type of Wheelchair: Manual    Wheelchair assist level: Minimal Assistance - Patient > 75% Max wheelchair distance: 150 ft    Wheelchair 50 feet with 2 turns activity    Assist        Assist Level: Minimal Assistance - Patient > 75%   Wheelchair 150 feet activity      Assist  Wheelchair 150 feet activity did not occur: Safety/medical concerns   Assist Level: Minimal Assistance - Patient > 75%   Blood pressure (!) 95/56, pulse 76, temperature 97.9 F (36.6 C), temperature source Oral, resp. rate 18, height 5\' 3"  (1.6 m), weight 82.5 kg, SpO2 98 %.  Medical Problem List and Plan: 1.Left hemiparesis and functional deficitssecondary to right MCA infarct after right ICA,MCA, ACA occlusion. Pt s/p reperfusion/stenting---likely DM and smoking as main risk ractors   Continue CIR- plan d/c in am  The 2. Antithrombotics: -DVT/anticoagulation:Pharmaceutical:Lovenoxadded 11/30.   Dopplers negative for DVT on 11/30 -antiplatelet therapy: On ASA/Brillita. 3.Chronic thoracic/ shoulder pain/Pain Management:On hydrocodone 10mg  QIDat home (went to pain clinic), currently on 5mg  q6h prn, does ok with this and would rec this  be d/c dose with f/u at her pain clinic   HA, cont topiramate  BID - controlled on 12/20  Relatively controlled with medication on 12/20 4. Mood:LCSW to follow for evaluation and support.  -antipsychotic agents: N/A  -Sleep:    Dc seroquel per pt request   Restoril 15mg  which she has used at home  Hx of depression and anxiety - appreciate neuropsych, on Cymbalta and Klonopin 5. Neuropsych: This patientiscapable of making decisions on herown behalf. 6. Skin/Wound Care:Routine pressure relief measures. 7. Fluids/Electrolytes/Nutrition:Monitor I/O 8. R-ICAthrombus s/pstent: On Brillinta and ASA. Monitor CBC serially. Avoid hypotension. 9. Leucocytosis: Resolved 10. Hypomagnesemia:  Magnesium 1.9 on 11/28  11. T2DM with hyperglycemia: Hgb A1C- 9.0. Was on lantus 40 units with meal coverage and invokana PTA.  CBG (last 3)  Recent Labs    03/29/19 1643 03/29/19 2127 03/30/19 0603  GLUCAP 162* 142* 115*   on Lantus 15U and Invokana controlled  12/18  Good in hospital control  12. Left  thyroid nodule/Pulmonary nodules: Per CT neck 03/02/2019. Non-emergent thyroid ultrasound for follow up recommended.  13. COPD: On breo daily with albuterol prn.  14. Anxiety/depression with related insomnia : On Desvenlafaxine and clonazepam PTA?Now on Cymbalta 60 mg/day appreciate neuropsychology evaluation  Improved with restoril  13. Bradycardia: Resolved 14. No hx HTN, has historically run low BPs  Vitals:   03/30/19 0443 03/30/19 0502  BP: (!) 95/56   Pulse: (!) 111 76  Resp:    Temp:    SpO2: 98%    Soft on 12/20 15. Left breast cancer s/p bilateral mastectomy: 16.  Dysphagia related to CVA: Resolved 17.  Chronic constipation: Added senna and prn dulc supp   LOS: 21 days A FACE TO FACE EVALUATION WAS PERFORMED  Charlett Blake 03/30/2019, 9:23 AM

## 2019-03-31 LAB — GLUCOSE, CAPILLARY: Glucose-Capillary: 115 mg/dL — ABNORMAL HIGH (ref 70–99)

## 2019-03-31 MED ORDER — INSULIN GLARGINE 100 UNIT/ML SOLOSTAR PEN: 15.0000 [IU] | PEN_INJECTOR | Freq: Every day | SUBCUTANEOUS | 11 refills | Status: DC

## 2019-03-31 MED ORDER — ONETOUCH ULTRASOFT LANCETS MISC: 12 refills | Status: DC

## 2019-03-31 MED ORDER — FENOFIBRATE 160 MG PO TABS: 160.0000 mg | ORAL_TABLET | Freq: Every day | ORAL | 0 refills | Status: DC

## 2019-03-31 MED ORDER — DULOXETINE HCL 60 MG PO CPEP: 60.0000 mg | ORAL_CAPSULE | Freq: Every day | ORAL | 0 refills | Status: DC

## 2019-03-31 MED ORDER — ACETAMINOPHEN 325 MG PO TABS: 325.0000 mg | ORAL_TABLET | ORAL | Status: DC | PRN

## 2019-03-31 MED ORDER — SACCHAROMYCES BOULARDII 250 MG PO CAPS: 250.0000 mg | ORAL_CAPSULE | Freq: Two times a day (BID) | ORAL | Status: DC

## 2019-03-31 MED ORDER — TOPIRAMATE 25 MG PO TABS: 25.0000 mg | ORAL_TABLET | Freq: Two times a day (BID) | ORAL | 0 refills | Status: DC

## 2019-03-31 MED ORDER — CANAGLIFLOZIN 300 MG PO TABS: 300.0000 mg | ORAL_TABLET | Freq: Every day | ORAL | 0 refills | Status: DC

## 2019-03-31 MED ORDER — SENNOSIDES-DOCUSATE SODIUM 8.6-50 MG PO TABS: 2.0000 | ORAL_TABLET | Freq: Every day | ORAL | 0 refills | Status: DC

## 2019-03-31 MED ORDER — TICAGRELOR 90 MG PO TABS: 90.0000 mg | ORAL_TABLET | Freq: Two times a day (BID) | ORAL | 2 refills | Status: DC

## 2019-03-31 MED ORDER — PEN NEEDLES 32G X 5 MM MISC: 1.0000 "application " | Freq: Every day | 0 refills | Status: DC

## 2019-03-31 MED ORDER — CLONAZEPAM 0.5 MG PO TABS: 0.2500 mg | ORAL_TABLET | Freq: Two times a day (BID) | ORAL | 0 refills | Status: AC | PRN

## 2019-03-31 MED ORDER — HYDROCODONE-ACETAMINOPHEN 5-325 MG PO TABS: 1.0000 | ORAL_TABLET | Freq: Four times a day (QID) | ORAL | 0 refills | Status: DC | PRN

## 2019-03-31 MED ORDER — PANTOPRAZOLE SODIUM 40 MG PO TBEC: 40.0000 mg | DELAYED_RELEASE_TABLET | Freq: Every day | ORAL | 0 refills | Status: DC

## 2019-03-31 MED ORDER — POTASSIUM CHLORIDE CRYS ER 10 MEQ PO TBCR: 10.0000 meq | EXTENDED_RELEASE_TABLET | Freq: Two times a day (BID) | ORAL | 0 refills | Status: DC

## 2019-03-31 NOTE — Progress Notes (Signed)
Recreational Therapy Discharge Summary Patient Details  Name: Ann Lopez MRN: [id number] Date of Birth: October 05, 1970 Today's Date: 03/31/2019  Long term goals set: 1  Long term goals met: 1  Comments on progress toward goals: Pt made good progress during LOS and is ready for discharge home with family to provide 24 hour supervision/assistance.  Pt completes standing TR tasks with min assist and requires set up assistance for seated tasks.  Pt is extremely motivated to gain further independence.  Education provided on leisure education, activity analysis with identification of adaptations, energy conservation and community reintegration.   Reasons for discharge: discharge from hospital  Patient/family agrees with progress made and goals achieved: Yes  Shazia Mitchener 03/31/2019, 2:31 PM

## 2019-03-31 NOTE — Plan of Care (Signed)
  Problem: Consults Goal: RH STROKE PATIENT EDUCATION Description: See Patient Education module for education specifics  Outcome: Completed/Met Goal: Nutrition Consult-if indicated Outcome: Completed/Met Goal: Diabetes Guidelines if Diabetic/Glucose > 140 Description: If diabetic or lab glucose is > 140 mg/dl - Initiate Diabetes/Hyperglycemia Guidelines & Document Interventions  Outcome: Completed/Met   Problem: RH BOWEL ELIMINATION Goal: RH STG MANAGE BOWEL WITH ASSISTANCE Description: STG Manage Bowel with mod  I Assistance. Outcome: Completed/Met   Problem: RH BLADDER ELIMINATION Goal: RH STG MANAGE BLADDER WITH ASSISTANCE Description: STG Manage Bladder With mod  I Assistance Outcome: Completed/Met   Problem: RH SKIN INTEGRITY Goal: RH STG MAINTAIN SKIN INTEGRITY WITH ASSISTANCE Description: STG Maintain Skin Integrity With min Assistance. Outcome: Completed/Met   Problem: RH SAFETY Goal: RH STG ADHERE TO SAFETY PRECAUTIONS W/ASSISTANCE/DEVICE Description: STG Adhere to Safety Precautions With cues/reminders Assistance and appropriate assistive Device. Outcome: Completed/Met   Problem: RH PAIN MANAGEMENT Goal: RH STG PAIN MANAGED AT OR BELOW PT'S PAIN GOAL Description: <3 on a 0-10 pain scale Outcome: Completed/Met   Problem: RH KNOWLEDGE DEFICIT Goal: RH STG INCREASE KNOWLEDGE OF DIABETES Description: Patient will be able to demonstrate knowledge of diabetes medication, dietary restrictions, and follow up care with the MD post discharge with min assist from staff. Outcome: Completed/Met Goal: RH STG INCREASE KNOWLEDGE OF HYPERTENSION Description: Patient will be able to demonstrate knowledge of HTN medication, dietary restrictions, and follow up care with the MD post discharge with min assist from staff.  Outcome: Completed/Met Goal: RH STG INCREASE KNOWLEDGE OF STROKE PROPHYLAXIS Description: Patient Outcome: Completed/Met

## 2019-03-31 NOTE — Discharge Summary (Signed)
Physician Discharge Summary  Patient ID: Ann Lopez MRN: [id number] DOB/AGE: 05-12-1970 48 y.o.  Admit date: 03/09/2019 Discharge date: 03/31/2019  Discharge Diagnoses:  Principal Problem:   Acute ischemic right middle cerebral artery (MCA) stroke (HCC) Active Problems:   Left hemiparesis (HCC)   Bradycardia   Major depressive disorder, recurrent episode, moderate (HCC)   MCI (mild cognitive impairment) with memory loss   Panic disorder with agoraphobia and moderate panic attacks   Other chronic pain   Uncontrolled type 2 diabetes mellitus with hyperglycemia (HCC)   Vascular headache   Discharged Condition: stable   Significant Diagnostic Studies: VAS [place] LOWER EXTREMITY VENOUS (DVT)  Result Date: 03/10/2019  Lower Venous Study Indications: Swelling, and stroke.  Comparison Study: no prior Performing Technologist: June Leap RDMS, RVT  Examination Guidelines: A complete evaluation includes B-mode imaging, spectral Doppler, color Doppler, and power Doppler as needed of all accessible portions of each vessel. Bilateral testing is considered an integral part of a complete examination. Limited examinations for reoccurring indications may be performed as noted.  +---------+---------------+---------+-----------+----------+--------------+ RIGHT    CompressibilityPhasicitySpontaneityPropertiesThrombus Aging +---------+---------------+---------+-----------+----------+--------------+ CFV      Full           Yes      Yes                                 +---------+---------------+---------+-----------+----------+--------------+ SFJ      Full                                                        +---------+---------------+---------+-----------+----------+--------------+ FV Prox  Full                                                        +---------+---------------+---------+-----------+----------+--------------+ FV Mid   Full                                                         +---------+---------------+---------+-----------+----------+--------------+ FV DistalFull                                                        +---------+---------------+---------+-----------+----------+--------------+ PFV      Full                                                        +---------+---------------+---------+-----------+----------+--------------+ POP      Full           Yes      Yes                                 +---------+---------------+---------+-----------+----------+--------------+  PTV      Full                                                        +---------+---------------+---------+-----------+----------+--------------+ PERO     Full                                                        +---------+---------------+---------+-----------+----------+--------------+   +---------+---------------+---------+-----------+----------+--------------+ LEFT     CompressibilityPhasicitySpontaneityPropertiesThrombus Aging +---------+---------------+---------+-----------+----------+--------------+ CFV      Full           Yes      Yes                                 +---------+---------------+---------+-----------+----------+--------------+ SFJ      Full                                                        +---------+---------------+---------+-----------+----------+--------------+ FV Prox  Full                                                        +---------+---------------+---------+-----------+----------+--------------+ FV Mid   Full                                                        +---------+---------------+---------+-----------+----------+--------------+ FV DistalFull                                                        +---------+---------------+---------+-----------+----------+--------------+ PFV      Full                                                         +---------+---------------+---------+-----------+----------+--------------+ POP      Full           Yes      Yes                                 +---------+---------------+---------+-----------+----------+--------------+ PTV      Full                                                        +---------+---------------+---------+-----------+----------+--------------+  PERO     Full                                                        +---------+---------------+---------+-----------+----------+--------------+     Summary: Right: There is no evidence of deep vein thrombosis in the lower extremity. No cystic structure found in the popliteal fossa. Left: There is no evidence of deep vein thrombosis in the lower extremity. No cystic structure found in the popliteal fossa.  *See table(s) above for measurements and observations. Electronically signed by Deitra Mayo MD on 03/10/2019 at 9:24:38 AM.    Final     Labs:  Basic Metabolic Panel: BMP Latest Ref Rng & Units 03/30/2019 03/23/2019 03/20/2019  Glucose 70 - 99 mg/dL 131(H) 101(H) 141(H)  BUN 6 - 20 mg/dL 13 11 9   Creatinine 0.44 - 1.00 mg/dL 0.75 0.70 0.91  Sodium 135 - 145 mmol/L 138 139 137  Potassium 3.5 - 5.1 mmol/L 3.9 4.1 3.2(L)  Chloride 98 - 111 mmol/L 106 108 105  CO2 22 - 32 mmol/L 23 23 22   Calcium 8.9 - 10.3 mg/dL 9.5 9.3 9.1    CBC: CBC Latest Ref Rng & Units 03/30/2019 03/23/2019 03/20/2019  WBC 4.0 - 10.5 K/uL 6.7 6.9 9.7  Hemoglobin 12.0 - 15.0 g/dL 15.6(H) 15.5(H) 15.0  Hematocrit 36.0 - 46.0 % 47.7(H) 47.7(H) 45.2  Platelets 150 - 400 K/uL 302 299 319    CBG: No results for input(s): GLUCAP in the last 168 hours.   Brief HPI:   Ann Lopez is a 48 y.o. female with history of T2DM, HTN, gastric sleeve, breast cancer s/p bilateral mastectomy with post chemo memory deficits, chronic back pain, migraines who was admitted on 03/02/19 after a fall off the commode and found with left sided weakness,  left neglect and right gaze preference. CTA/P showed occlusive thrombus in R-MCA terminus extending into M1 R-MCA with R-ACA core infarct in R-MCA and large penumbra as well as incidental findings of left thyroid nodule. She underwent cerebral angio with emergent thrombectomy and complete revascularization of occluded R-ICA terminus, R-MCA and R-ACA with stent assisted angioplasty of symptomatic proximal R-ICA stenosis by Dr. Estanislado Pandy. Post procedure she had hypotension felt to be due to carotid manipulation as well as decrease in LOC with increase in LLE tone. EEG showed diffuse encephalopathy and repeat CT showed mass effect on right lateral ventricle and negative for hemorrhage.   She tolerated extubation without difficulty but had bouts of agitation alternating with lethargy. Most recent CT head 11/25 showed residual hyperdensity within  infracted right lentiform nucleus compatible with resolving confluent petechial hemorrhage and persistent partial effacement of right lateral ventricle.  Dr. Erlinda Hong felt the stroke was embolic due to large vessel stenosis but PAF not fully ruled out.  30-day cardiac event monitor recommended on outpatient basis.  He also recommended SBP goal of 120-140.  Patient with resultant left facial droop with cognitive deficits, left-sided weakness and right gaze preference with left neglect affecting mobility and ADLs.  CIR recommended due to functional decline   Hospital Course: JENA WINEBERG was admitted to rehab 03/09/2019 for inpatient therapies to consist of PT, ST and OT at least three hours five days a week. Past admission physiatrist, therapy team and rehab RN have worked together to provide customized collaborative inpatient rehab. BLE  dopplers were negative for DVT and SQ Lovenox was added for DVT prophylaxis. She continued to report HA and toprimate was added and titrated to bid with improvement in control. Chronic pain has been managed with use of lower dose hydrocodone  qid prn. Seroquel was discontinued and Restoril was resumed to help manage insomnia. She was maintained on ASA/Brillinta and has been tolerating this without SE. Follow up CBC showed H/H and platelets are stable.   Serial BMET showed that hypokalemia has resolved with addition of supplement and renal status is stable. Her blood pressures were monitored on TID basis and continues to be low normal without orthostatic symptoms. Her diabetes has been monitored with ac/hs CBG checks and SSI was use prn for tighter BS control. She was maintained on Invokana 300 mg and Lantus 15 untis with reasonable control of BS. Po intake has been good and she is continent of bowel and bladder. Dr. Sima Matas has worked with patient on coping strategies to help manage anxiety and depression.  Klonopin was resumed to help manage anxiety. She has made gains during rehab stay and is at min assist level. She will continue to receive follow up [place], [place] and [place] by    after discharge   Rehab course: During patient's stay in rehab weekly team conferences were held to monitor patient's progress, set goals and discuss barriers to discharge. At admission, patient required max assist for mobility and basic self care tasks. She exhibited mild to moderate cognitive impairments with distractibility and needed extra time for responses. She  has had improvement in activity tolerance, balance, postural control as well as ability to compensate for deficits. He/She has had improvement in functional use LUE  and LLE as well as improvement in awareness.   Cognitive function has improved and patient was able to complete cognitive tasks at modified independent level therefore ST signed off by 12/4. She is able to complete ADL tasks with min assist for ADL tasks. She requires supervision to [facility] for transfers and is able to ambulate 150' with min assist, tactile cues for sequencing and RW. Family education was completed regarding all aspects of safety  and mobility.    Discharge disposition: 01-Home or Self Care  Diet: Heart Healthy/Carb Modified.   Special Instructions: 1. Monitor BS ac/hs basis and follow up with PCP for further adjustment in insulin.  2. Recommend thyroid ultrasound for follow up on thyroid mass.  3. Recheck CBC/BMET in a couple of weeks.   Discharge Instructions    Ambulatory referral to Cardiology   Complete by: As directed    Needs 30 day event monitor for stroke work up   Ambulatory referral to Physical Medicine Rehab   Complete by: As directed    1-2 weeks TC appt     Allergies as of 03/31/2019      Reactions   Azithromycin Other (See Comments)   Unknown reaction   Morphine And Related Other (See Comments)   Unknown reaction - Can tolerate hydrocodone and dilaudid without side effects   Nitrofurantoin Other (See Comments)   Unknown reaction   Penicillins Hives, Itching   Has patient had a PCN reaction causing immediate rash, facial/tongue/throat swelling, SOB or lightheadedness with hypotension: Yes Has patient had a PCN reaction causing severe rash involving mucus membranes or skin necrosis: No Has patient had a PCN reaction that required hospitalization No Has patient had a PCN reaction occurring within the last 10 years: No If all of the above answers are "NO",  then may proceed with Cephalosporin use.   Sulfonamide Derivatives Hives      Medication List    STOP taking these medications   Fenofibric Acid 135 MG Cpdr   HYDROcodone-acetaminophen 10-325 MG tablet Commonly known as: NORCO Replaced by: HYDROcodone-acetaminophen 5-325 MG tablet   insulin glargine 100 UNIT/ML injection Commonly known as: LANTUS Replaced by: Insulin Glargine 100 UNIT/ML Solostar Pen   omeprazole 40 MG capsule Commonly known as: PRILOSEC   QUEtiapine 25 MG tablet Commonly known as: SEROQUEL     TAKE these medications   acetaminophen 325 MG tablet Commonly known as: TYLENOL Take 1-2 tablets (325-650 mg  total) by mouth every 4 (four) hours as needed for mild pain.   albuterol 108 (90 Base) MCG/ACT inhaler Commonly known as: VENTOLIN HFA Inhale 1 puff into the lungs every 6 (six) hours as needed for wheezing or shortness of breath. What changed: Another medication with the same name was removed. Continue taking this medication, and follow the directions you see here.   aspirin EC 81 MG tablet Take 1 tablet (81 mg total) by mouth daily.   canagliflozin 300 MG Tabs tablet Commonly known as: Invokana Take 1 tablet (300 mg total) by mouth daily before breakfast.   clonazePAM 0.5 MG tablet Commonly known as: KLONOPIN Take 0.5 tablets (0.25 mg total) by mouth 2 (two) times daily as needed for anxiety.   DULoxetine 60 MG capsule Commonly known as: CYMBALTA Take 1 capsule (60 mg total) by mouth daily.   fenofibrate 160 MG tablet Take 1 tablet (160 mg total) by mouth daily. What changed:   medication strength  how much to take   glucose blood test strip Commonly known as: Choice DM Fora G20 Test Strips Use as instructed   HYDROcodone-acetaminophen 5-325 MG tablet--Rx# Commonly known as: NORCO/VICODIN Take 1 tablet by mouth every 6 (six) hours as needed for severe pain. Replaces: HYDROcodone-acetaminophen 10-325 MG tablet Notes to patient: Cut the 10 mg pills that you have at home in 1/2 and can use it four times a day as needed.    Insulin Glargine 100 UNIT/ML Solostar Pen Commonly known as: LANTUS Inject 15 Units into the skin daily. Replaces: insulin glargine 100 UNIT/ML injection   mometasone 50 MCG/ACT nasal spray Commonly known as: Nasonex Place 2 sprays into the nose daily.   onetouch ultrasoft lancets Use as instructed; check sugar before meals and at bedtime What changed: additional instructions   pantoprazole 40 MG tablet Commonly known as: PROTONIX Take 1 tablet (40 mg total) by mouth daily.   Pen Needles 32G X 5 MM Misc 1 application by Does not apply route  at bedtime.   potassium chloride 10 MEQ tablet Commonly known as: KLOR-CON Take 1 tablet (10 mEq total) by mouth 2 (two) times daily.   saccharomyces boulardii 250 MG capsule Commonly known as: FLORASTOR Take 1 capsule (250 mg total) by mouth 2 (two) times daily.   senna-docusate 8.6-50 MG tablet Commonly known as: Senokot-S Take 2 tablets by mouth at bedtime. What changed:   how much to take  when to take this   temazepam 15 MG capsule Commonly known as: RESTORIL Take 1 capsule (15 mg total) by mouth at bedtime as needed for sleep.   ticagrelor 90 MG Tabs tablet Commonly known as: BRILINTA Take 1 tablet (90 mg total) by mouth 2 (two) times daily.   topiramate 25 MG tablet Commonly known as: TOPAMAX Take 1 tablet (25 mg total) by mouth 2 (two) times  daily.      Follow-up Information    Hayden Rasmussen, MD. Call on 04/01/2019.   Specialty: Family Medicine Why: For post hospital follow up and to set up thyroid ultrasound.  Contact information: [place] [address] [phone]        [facility]. Call on 04/01/2019.   Specialty: Neurology Why: for stroke follow up Contact information: [address] [place]       Kirsteins, Luanna Salk, MD Follow up.   Specialty: Physical Medicine and Rehabilitation Why: Office will call you with follow up appointment Contact information: [place] [address] [phone]        Luanne Bras, MD Follow up.   Specialties: Interventional Radiology, Radiology Why: Office will call you with follow up appointment Contact information: [place] [address] [phone]        [facility] Follow up.   Specialty: Cardiology Why: Office will call you to set up for 30 day event monitor. Contact information: [address] [place]  [place]          Signed: Bary Leriche 04/07/2019, 9:43 PM

## 2019-03-31 NOTE — Discharge Instructions (Signed)
Inpatient Rehab Discharge Instructions  Ann Lopez Discharge date and time: 03/31/19   Activities/Precautions/ Functional Status: Activity: no lifting, driving, or strenuous exercise till cleared by MD Diet: cardiac diet and diabetic diet Wound Care: none needed   Functional status:  ___ No restrictions     ___ Walk up steps independently _X__ 24/7 supervision/assistance   ___ Walk up steps with assistance ___ Intermittent supervision/assistance  ___ Bathe/dress independently ___ Walk with walker     ___ Bathe/dress with assistance ___ Walk Independently    ___ Shower independently _X__ Walk with assistance (limited)   _X__ Shower with assistance _X__ No alcohol     ___ Return to work/school ________   Special Instructions: 1. Monitor blood sugars before meals and at bedtime. Follow up with PCP for further adjustments. 2. Limit walking to 10-20 feet in home setting with family providing hands on assistance.     COMMUNITY REFERRALS UPON DISCHARGE:    Home Health:   [place] [phone] Date of last service:03/31/2019  Medical Equipment/Items Ordered:WHEELCHAIR, Vassie Moselle AND 3 IN 1   Agency/Supplier:[facility] [phone]  Other:DR. JOHN [facility] [id number]  STROKE/TIA DISCHARGE INSTRUCTIONS SMOKING Cigarette smoking nearly doubles your risk of having a stroke & is the single most alterable risk factor  If you smoke or have smoked in the last 12 months, you are advised to quit smoking for your health.  Most of the excess cardiovascular risk related to smoking disappears within a year of stopping.  Ask you doctor about anti-smoking medications  [place] Quit Line: 1-800-QUIT NOW  Free Smoking Cessation Classes [phone]  CHOLESTEROL Know your levels; limit fat & cholesterol in your diet  Lipid Panel     Component Value Date/Time   CHOL 166 03/03/2019 0446   TRIG 515 (H) 03/03/2019 0446   HDL 23 (L)  03/03/2019 0446   CHOLHDL 7.2 03/03/2019 0446   VLDL UNABLE TO CALCULATE IF TRIGLYCERIDE OVER 400 mg/dL 03/03/2019 0446   LDLCALC UNABLE TO CALCULATE IF TRIGLYCERIDE OVER 400 mg/dL 03/03/2019 0446      Many patients benefit from treatment even if their cholesterol is at goal.  Goal: Total Cholesterol (CHOL) less than 160  Goal:  Triglycerides (TRIG) less than 150  Goal:  HDL greater than 40  Goal:  LDL (LDLCALC) less than 100   BLOOD PRESSURE American Stroke Association blood pressure target is less that 120/80 mm/Hg  Your discharge blood pressure is:  BP: (!) 89/62  Monitor your blood pressure  Limit your salt and alcohol intake  Many individuals will require more than one medication for high blood pressure  DIABETES (A1c is a blood sugar average for last 3 months) Goal HGBA1c is under 7% (HBGA1c is blood sugar average for last 3 months)  Diabetes:     Lab Results  Component Value Date   HGBA1C 9.0 (H) 03/03/2019     Your HGBA1c can be lowered with medications, healthy diet, and exercise.  Check your blood sugar as directed by your physician  Call your physician if you experience unexplained or low blood sugars.  PHYSICAL ACTIVITY/REHABILITATION Goal is 30 minutes at least 4 days per week  Activity: No driving, Therapies: see above Return to work: N/A  Activity decreases your risk of heart attack and stroke and makes your heart stronger.  It helps control your weight and blood pressure; helps you relax and can improve your mood.  Participate in a regular exercise program.  Talk  with your doctor about the best form of exercise for you (dancing, walking, swimming, cycling).  DIET/WEIGHT Goal is to maintain a healthy weight  Your discharge diet is:  Diet Order            Diet Carb Modified Fluid consistency: Thin; Room service appropriate? Yes  Diet effective now            *liquids Your height is:  Height: 5\' 3"  (160 cm) Your current weight is: Weight: 82.5  kg Your Body Mass Index (BMI) is:  BMI (Calculated): 32.23  Following the type of diet specifically designed for you will help prevent another stroke.  Your goal weight is:   Your goal Body Mass Index (BMI) is 19-24.  Healthy food habits can help reduce 3 risk factors for stroke:  High cholesterol, hypertension, and excess weight.  RESOURCES Stroke/Support Group:  Call [phone]   STROKE EDUCATION PROVIDED/REVIEWED AND GIVEN TO PATIENT Stroke warning signs and symptoms How to activate emergency medical system (call 911). Medications prescribed at discharge. Need for follow-up after discharge. Personal risk factors for stroke. Pneumonia vaccine given:  Flu vaccine given:  My questions have been answered, the writing is legible, and I understand these instructions.  I will adhere to these goals & educational materials that have been provided to me after my discharge from the hospital.        My questions have been answered and I understand these instructions. I will adhere to these goals and the provided educational materials after my discharge from the hospital.  Patient/Caregiver Signature _______________________________ Date __________  Clinician Signature _______________________________________ Date __________  Please bring this form and your medication list with you to all your follow-up doctor's appointments.

## 2019-03-31 NOTE — Progress Notes (Signed)
[facility] PHYSICAL MEDICINE & REHABILITATION PROGRESS NOTE   Subjective/Complaints:  Excited about d/c  ROS: Denies CP, SOB, N/V/D  Objective:   No results found. Recent Labs    03/30/19 0719  WBC 6.7  HGB 15.6*  HCT 47.7*  PLT 302   Recent Labs    03/30/19 0719  NA 138  K 3.9  CL 106  CO2 23  GLUCOSE 131*  BUN 13  CREATININE 0.75  CALCIUM 9.5    Intake/Output Summary (Last 24 hours) at 03/31/2019 0745 Last data filed at 03/30/2019 1900 Gross per 24 hour  Intake 390 ml  Output --  Net 390 ml     Physical Exam: Vital Signs Blood pressure (!) 89/62, pulse 72, temperature 98.4 F (36.9 C), temperature source Oral, resp. rate 16, height 5\' 3"  (1.6 m), weight 82.5 kg, SpO2 98 %. Constitutional: No distress . Vital signs reviewed. HENT: Normocephalic.  Atraumatic. Eyes: EOMI. No discharge. Cardiovascular: No JVD. Respiratory: Normal effort.  No stridor. GI: Non-distended. Skin: Warm and dry.  Intact. Psych: Normal mood.  Normal behavior. Musc: No edema in extremities.  No tenderness in extremities. Neuro: Alert Motor: RUE/RLE: 5/5 proximal distal LUE: 0/5 proximal distal, unchanged LLE: Hip flexion, knee extension 4/5, ankle dorsiflexion 0/5, unchanged  Assessment/Plan: 1. Functional deficits secondary to Right MCA infarct  Stable for D/C today F/u PCP in 3-4 weeks F/u PM&R 2 weeks See D/C summary See D/C instructions Care Tool:  Bathing    Body parts bathed by patient: Left arm, Chest, Abdomen, Right upper leg, Left upper leg, Right lower leg, Face, Front perineal area, Buttocks, Left lower leg, Right arm   Body parts bathed by helper: Right arm     Bathing assist Assist Level: Contact Guard/Touching assist     Upper Body Dressing/Undressing Upper body dressing   What is the patient wearing?: Pull over shirt    Upper body assist Assist Level: Independent    Lower Body Dressing/Undressing Lower body dressing      What is the patient  wearing?: Pants, Underwear/pull up     Lower body assist Assist for lower body dressing: Minimal Assistance - Patient > 75%     Toileting Toileting    Toileting assist Assist for toileting: Minimal Assistance - Patient > 75%     Transfers Chair/bed transfer  Transfers assist     Chair/bed transfer assist level: Contact Guard/Touching assist     Locomotion Ambulation   Ambulation assist   Ambulation activity did not occur: Safety/medical concerns  Assist level: Minimal Assistance - Patient > 75% Assistive device: Walker-rolling Max distance: 150 ft   Walk 10 feet activity   Assist     Assist level: Minimal Assistance - Patient > 75% Assistive device: Walker-rolling   Walk 50 feet activity   Assist Walk 50 feet with 2 turns activity did not occur: Safety/medical concerns  Assist level: Minimal Assistance - Patient > 75% Assistive device: Walker-rolling    Walk 150 feet activity   Assist Walk 150 feet activity did not occur: Safety/medical concerns  Assist level: Minimal Assistance - Patient > 75% Assistive device: Walker-rolling    Walk 10 feet on uneven surface  activity   Assist Walk 10 feet on uneven surfaces activity did not occur: Safety/medical concerns   Assist level: Minimal Assistance - Patient > 75%     Wheelchair     Assist Will patient use wheelchair at discharge?: Yes Type of Wheelchair: Manual    Wheelchair assist level:  Supervision/Verbal cueing Max wheelchair distance: 150 ft    Wheelchair 50 feet with 2 turns activity    Assist        Assist Level: Supervision/Verbal cueing   Wheelchair 150 feet activity     Assist  Wheelchair 150 feet activity did not occur: Safety/medical concerns   Assist Level: Supervision/Verbal cueing   Blood pressure (!) 89/62, pulse 72, temperature 98.4 F (36.9 C), temperature source Oral, resp. rate 16, height 5\' 3"  (1.6 m), weight 82.5 kg, SpO2 98 %.  Medical Problem  List and Plan: 1.Left hemiparesis and functional deficitssecondary to right MCA infarct after right ICA,MCA, ACA occlusion. Pt s/p reperfusion/stenting---likely DM and smoking as main risk ractors   Continue CIR- plan d/c today  The 2. Antithrombotics: -DVT/anticoagulation:Pharmaceutical:Lovenoxadded 11/30.   Dopplers negative for DVT on 11/30 -antiplatelet therapy: On ASA/Brillita. 3.Chronic thoracic/ shoulder pain/Pain Management:On hydrocodone 10mg  QIDat home (went to pain clinic), currently on 5mg  q6h prn, does ok with this and would rec this  be d/c dose with f/u at her pain clinic   HA, cont topiramate  BID - controlled on 12/20  Relatively controlled with medication on 12/20 4. Mood:LCSW to follow for evaluation and support.  -antipsychotic agents: N/A  -Sleep:    Dc seroquel per pt request   Restoril 15mg  which she has used at home  Hx of depression and anxiety - appreciate neuropsych, on Cymbalta and Klonopin 5. Neuropsych: This patientiscapable of making decisions on herown behalf. 6. Skin/Wound Care:Routine pressure relief measures. 7. Fluids/Electrolytes/Nutrition:Monitor I/O 8. R-ICAthrombus s/pstent: On Brillinta and ASA. Monitor CBC serially. Avoid hypotension. 9. Leucocytosis: Resolved 10. Hypomagnesemia:  Magnesium 1.9 on 11/28  11. T2DM with hyperglycemia: Hgb A1C- 9.0. Was on lantus 40 units with meal coverage and invokana PTA.  CBG (last 3)  Recent Labs    03/30/19 1711 03/30/19 2105 03/31/19 0639  GLUCAP 100* 158* 115*   on Lantus 15U and Invokana controlled  12/22  Good in hospital control  12. Left thyroid nodule/Pulmonary nodules: Per CT neck 03/02/2019. Non-emergent thyroid ultrasound for follow up recommended.  13. COPD: On breo daily with albuterol prn.  14. Anxiety/depression with related insomnia : On Desvenlafaxine and clonazepam PTA?Now on Cymbalta 60 mg/day appreciate neuropsychology  evaluation  Improved with restoril  13. Bradycardia: Resolved 14. No hx HTN, has historically run low BPs  Vitals:   03/31/19 0425 03/31/19 0440  BP: (!) 89/72 (!) 89/62  Pulse: (!) 101 72  Resp: 16   Temp: 98.4 F (36.9 C)   SpO2: 98%    Soft but at baseline on 12/21 15. Left breast cancer s/p bilateral mastectomy: 16.  Dysphagia related to CVA: Resolved 17.  Chronic constipation: Added senna and prn dulc supp   LOS: 22 days A FACE TO FACE EVALUATION WAS PERFORMED  Charlett Blake 03/31/2019, 7:45 AM

## 2019-04-01 ENCOUNTER — Telehealth: Payer: Self-pay | Admitting: *Deleted

## 2019-04-01 NOTE — Telephone Encounter (Signed)
Contacted patient. Transitional Care call completed, appointment confirmed, address  Confirmed, new patient packet sent.     1. Are you/is patient experiencing any problems since coming home? No  Are there any questions regarding any aspect of care? No  2. Are there any questions regarding medications administration/dosing? Patient was initially concerned about her pain meds. She is under contract with a clinic down in [place].  She has since secured an appointment with them and is relieved that she will not be out. Are meds being taken as prescribed? Yes Patient should review meds with caller to confirm   3. Have there been any falls? No  4. Has Home Health been to the house and/or have they contacted you? Intake call completed, waiting for PT to call and schedule HHPT If not, have you tried to contact them? Can we help you contact them?   5. Are bowels and bladder emptying properly?  Yes, some constipation Are there any unexpected incontinence issues? No If applicable, is patient following bowel/bladder programs?   6. Any fevers, problems with breathing, unexpected pain? No   7. Are there any skin problems or new areas of breakdown?  No  8. Has the patient/family member arranged specialty MD follow up (ie cardiology/neurology/renal/surgical/etc)?  Primary care appointment made, discharge summary was not completed at the time of this call. Unable to determine who patient is supposed to follow up with. Can we help arrange?   9. Does the patient need any other services or support that we can help arrange? No  10. Are caregivers following through as expected in assisting the patient?  Yes  11. Has the patient quit smoking, drinking alcohol, or using drugs as recommended? No smoking, No drinking or drug use

## 2019-04-02 ENCOUNTER — Telehealth (HOSPITAL_COMMUNITY): Payer: Self-pay

## 2019-04-02 DIAGNOSIS — I69392 Facial weakness following cerebral infarction: Secondary | ICD-10-CM | POA: Diagnosis not present

## 2019-04-02 DIAGNOSIS — R911 Solitary pulmonary nodule: Secondary | ICD-10-CM | POA: Diagnosis not present

## 2019-04-02 DIAGNOSIS — J449 Chronic obstructive pulmonary disease, unspecified: Secondary | ICD-10-CM | POA: Diagnosis not present

## 2019-04-02 DIAGNOSIS — I69354 Hemiplegia and hemiparesis following cerebral infarction affecting left non-dominant side: Secondary | ICD-10-CM | POA: Diagnosis not present

## 2019-04-02 DIAGNOSIS — M810 Age-related osteoporosis without current pathological fracture: Secondary | ICD-10-CM | POA: Diagnosis not present

## 2019-04-02 DIAGNOSIS — I959 Hypotension, unspecified: Secondary | ICD-10-CM | POA: Diagnosis not present

## 2019-04-02 DIAGNOSIS — G9340 Encephalopathy, unspecified: Secondary | ICD-10-CM | POA: Diagnosis not present

## 2019-04-02 DIAGNOSIS — G4700 Insomnia, unspecified: Secondary | ICD-10-CM | POA: Diagnosis not present

## 2019-04-02 DIAGNOSIS — I69318 Other symptoms and signs involving cognitive functions following cerebral infarction: Secondary | ICD-10-CM | POA: Diagnosis not present

## 2019-04-02 DIAGNOSIS — R471 Dysarthria and anarthria: Secondary | ICD-10-CM | POA: Diagnosis not present

## 2019-04-02 DIAGNOSIS — K259 Gastric ulcer, unspecified as acute or chronic, without hemorrhage or perforation: Secondary | ICD-10-CM | POA: Diagnosis not present

## 2019-04-02 DIAGNOSIS — E785 Hyperlipidemia, unspecified: Secondary | ICD-10-CM | POA: Diagnosis not present

## 2019-04-02 DIAGNOSIS — G43909 Migraine, unspecified, not intractable, without status migrainosus: Secondary | ICD-10-CM | POA: Diagnosis not present

## 2019-04-02 DIAGNOSIS — G8929 Other chronic pain: Secondary | ICD-10-CM | POA: Diagnosis not present

## 2019-04-02 DIAGNOSIS — I69398 Other sequelae of cerebral infarction: Secondary | ICD-10-CM | POA: Diagnosis not present

## 2019-04-02 DIAGNOSIS — Z48812 Encounter for surgical aftercare following surgery on the circulatory system: Secondary | ICD-10-CM | POA: Diagnosis not present

## 2019-04-02 DIAGNOSIS — M8580 Other specified disorders of bone density and structure, unspecified site: Secondary | ICD-10-CM | POA: Diagnosis not present

## 2019-04-02 DIAGNOSIS — E119 Type 2 diabetes mellitus without complications: Secondary | ICD-10-CM | POA: Diagnosis not present

## 2019-04-02 DIAGNOSIS — I10 Essential (primary) hypertension: Secondary | ICD-10-CM | POA: Diagnosis not present

## 2019-04-02 DIAGNOSIS — M791 Myalgia, unspecified site: Secondary | ICD-10-CM | POA: Diagnosis not present

## 2019-04-02 DIAGNOSIS — M170 Bilateral primary osteoarthritis of knee: Secondary | ICD-10-CM | POA: Diagnosis not present

## 2019-04-02 DIAGNOSIS — M21372 Foot drop, left foot: Secondary | ICD-10-CM | POA: Diagnosis not present

## 2019-04-02 NOTE — Telephone Encounter (Signed)
Called to schedule 4 wk f/u, no answer, left vm. AW  

## 2019-04-02 NOTE — Telephone Encounter (Signed)
Pt returned call and scheduled 4 wk f/u. She also wanted her imaging sent to [facility] because of thyroid nodule that was found. I have pushed the imaging through powershare to [place]. AW

## 2019-04-05 NOTE — Progress Notes (Deleted)
Cardiology Office Note:   Date:  04/05/2019  NAME:  Ann ITURRALDE    MRN: [id number] DOB:  January 23, 1971   PCP:  Hayden Rasmussen, MD  Cardiologist:  No primary care provider on file.  Electrophysiologist:  None   Referring MD: Bary Leriche, PA-C   No chief complaint on file. ***  History of Present Illness:   Ann Lopez is a 48 y.o. female with a hx of diabetes, hypertension, stroke (right MCA infarct status post mechanical thrombectomy and right proximal ICA stent placement) who is being seen today for the evaluation of stroke at the request of Hayden Rasmussen, MD.  She had extensive stroke in November of this year.  There is rather large clot burden in the right MCA, ACA.  She also underwent stenting to the right proximal ICA.   On review of her CTA of her head and neck at the time of her stroke, she had high-grade stenosis at the right internal carotid artery bulb as well as soft plaque and thrombus present in that region.  This was the likely etiology for stroke.   Past Medical History: Past Medical History:  Diagnosis Date  . Allergy   . Anxiety   . Asthma   . Back pain   . Breast cancer ([place])   . Cancer ([place])   . Depression   . Diabetes mellitus   . Headache   . Hyperlipemia   . Osteopenia   . Osteoporosis    osteopenia    Past Surgical History: Past Surgical History:  Procedure Laterality Date  . ABDOMINAL HYSTERECTOMY     B oophorectomy for BRCA1 gene  . BREAST SURGERY    . CESAREAN SECTION    . INCISION AND DRAINAGE ABSCESS Left 11/14/2012   Procedure: INCISION AND DRAINAGE ABSCESS LEFT GROIN;  Surgeon: Jamesetta So, MD;  Location: [facility];  Service: General;  Laterality: Left;  . IR ANGIO VERTEBRAL SEL SUBCLAVIAN INNOMINATE UNI R MOD SED  03/02/2019  . IR CT HEAD LTD  03/02/2019  . IR INTRAVSC STENT CERV CAROTID W/O EMB-PROT MOD SED INC ANGIO  03/02/2019  . IR PERCUTANEOUS ART THROMBECTOMY/INFUSION INTRACRANIAL INC DIAG ANGIO  03/02/2019  .  LYMPHADENECTOMY    . MASTECTOMY Bilateral   . RADIOLOGY WITH ANESTHESIA N/A 03/02/2019   Procedure: IR WITH ANESTHESIA;  Surgeon: Luanne Bras, MD;  Location: [facility];  Service: Radiology;  Laterality: N/A;    Current Medications: No outpatient medications have been marked as taking for the 04/06/19 encounter (Appointment) with Geralynn Rile, MD.     Allergies:    Azithromycin, Morphine and related, Nitrofurantoin, Penicillins, and Sulfonamide derivatives   Social History: Social History   Socioeconomic History  . Marital status: Single    Spouse name: Not on file  . Number of children: 4  . Years of education: 33  . Highest education level: Not on file  Occupational History  . Occupation: Disabled  Tobacco Use  . Smoking status: Current Some Day Smoker    Packs/day: 0.50    Types: Cigarettes  . Smokeless tobacco: Never Used  Substance and Sexual Activity  . Alcohol use: No    Alcohol/week: 0.0 standard drinks  . Drug use: No  . Sexual activity: Not Currently  Other Topics Concern  . Not on file  Social History Narrative   Marital status: single      Lives: at home with her four sons (12, 62, 72, 56); no grandchildren  Employment: disability for breast cancer, DDD lumbar, anxiety/panic attacks      Tobacco: 1 ppd       Alcohol: none         Right-handed.   2-3 cups caffeine daily.   Social Determinants of Health   Financial Resource Strain:   . Difficulty of Paying Living Expenses: Not on file  Food Insecurity:   . Worried About Charity fundraiser in the Last Year: Not on file  . Ran Out of Food in the Last Year: Not on file  Transportation Needs:   . Lack of Transportation (Medical): Not on file  . Lack of Transportation (Non-Medical): Not on file  Physical Activity:   . Days of Exercise per Week: Not on file  . Minutes of Exercise per Session: Not on file  Stress:   . Feeling of Stress : Not on file  Social Connections:   . Frequency of  Communication with Friends and Family: Not on file  . Frequency of Social Gatherings with Friends and Family: Not on file  . Attends Religious Services: Not on file  . Active Member of Clubs or Organizations: Not on file  . Attends Archivist Meetings: Not on file  . Marital Status: Not on file     Family History: The patient's ***family history includes Cancer in her sister; Cancer (age of onset: 70) in her mother; Cancer (age of onset: 65) in her maternal grandfather and maternal grandmother; Hypertension in her sister; Hypothyroidism in her brother and sister.  ROS:   All other ROS reviewed and negative. Pertinent positives noted in the HPI.     EKGs/Labs/Other Studies Reviewed:   The following studies were personally reviewed by me today:  EKG:  EKG is *** ordered today.  The ekg ordered today demonstrates ***, and was personally reviewed by me.   TTE 03/03/2019  1. Left ventricular ejection fraction, by visual estimation, is 60 to 65%. The left ventricle has normal function. There is no left ventricular hypertrophy.  2. The left ventricle has no regional wall motion abnormalities.  3. Global right ventricle has normal systolic function.The right ventricular size is normal. No increase in right ventricular wall thickness.  4. Left atrial size was normal.  5. Right atrial size was normal.  6. The mitral valve is normal in structure. No evidence of mitral valve regurgitation. No evidence of mitral stenosis.  7. The tricuspid valve is normal in structure. Tricuspid valve regurgitation is not demonstrated.  8. The aortic valve is normal in structure. Aortic valve regurgitation is not visualized. No evidence of aortic valve sclerosis or stenosis.  9. The pulmonic valve was normal in structure. Pulmonic valve regurgitation is not visualized. 10. Normal pulmonary artery systolic pressure. 11. The inferior vena cava is dilated in size with >50% respiratory variability, suggesting  right atrial pressure of 8 mmHg.  CTA Head/Neck 03/02/2019 1. Prominent abnormal hypodensity within the origin of the right internal carotid artery and right carotid bulb, likely reflecting a combination of soft plaque and adherent thrombus. Resultant high-grade stenosis at this site. 2. Left common and internal carotid arteries patent within the neck without significant stenosis. 3. Bilateral vertebral arteries patent within the neck without significant stenosis. 4. 1.7 cm left thyroid lobe nodule. Nonemergent thyroid ultrasound recommended for further evaluation.   Recent Labs: 03/07/2019: Magnesium 1.9 03/10/2019: ALT 33 03/30/2019: BUN 13; Creatinine, Ser 0.75; Hemoglobin 15.6; Platelets 302; Potassium 3.9; Sodium 138   Recent Lipid Panel  Component Value Date/Time   CHOL 166 03/03/2019 0446   TRIG 515 (H) 03/03/2019 0446   HDL 23 (L) 03/03/2019 0446   CHOLHDL 7.2 03/03/2019 0446   VLDL UNABLE TO CALCULATE IF TRIGLYCERIDE OVER 400 mg/dL 03/03/2019 0446   LDLCALC UNABLE TO CALCULATE IF TRIGLYCERIDE OVER 400 mg/dL 03/03/2019 0446   LDLDIRECT 39.4 03/04/2019 1250    Physical Exam:   VS:  There were no vitals taken for this visit.   Wt Readings from Last 3 Encounters:  03/28/19 181 lb 14.1 oz (82.5 kg)  03/05/19 192 lb 0.3 oz (87.1 kg)  08/22/16 182 lb (82.6 kg)    General: Well nourished, well developed, in no acute distress Heart: Atraumatic, normal size  Eyes: PEERLA, EOMI  Neck: Supple, no JVD Endocrine: No thryomegaly Cardiac: Normal S1, S2; RRR; no murmurs, rubs, or gallops Lungs: Clear to auscultation bilaterally, no wheezing, rhonchi or rales  Abd: Soft, nontender, no hepatomegaly  Ext: No edema, pulses 2+ Musculoskeletal: No deformities, BUE and BLE strength normal and equal Skin: Warm and dry, no rashes   Neuro: Alert and oriented to person, place, time, and situation, CNII-XII grossly intact, no focal deficits  Psych: Normal mood and affect    ASSESSMENT:   Ann Lopez is a 48 y.o. female who presents for the following: No diagnosis found.  PLAN:   There are no diagnoses linked to this encounter.  Disposition: No follow-ups on file.  Medication Adjustments/Labs and Tests Ordered: Current medicines are reviewed at length with the patient today.  Concerns regarding medicines are outlined above.  No orders of the defined types were placed in this encounter.  No orders of the defined types were placed in this encounter.   There are no Patient Instructions on file for this visit.   Signed, Addison Naegeli. Audie Box, [place]  [address], [place], [place] [address] [phone]  04/05/2019 2:47 PM

## 2019-04-06 ENCOUNTER — Ambulatory Visit: Payer: Medicare Other | Admitting: Cardiovascular Disease

## 2019-04-07 DIAGNOSIS — M545 Low back pain: Secondary | ICD-10-CM | POA: Diagnosis not present

## 2019-04-07 DIAGNOSIS — Z79891 Long term (current) use of opiate analgesic: Secondary | ICD-10-CM | POA: Diagnosis not present

## 2019-04-07 DIAGNOSIS — G894 Chronic pain syndrome: Secondary | ICD-10-CM | POA: Diagnosis not present

## 2019-04-08 ENCOUNTER — Telehealth: Payer: Self-pay | Admitting: *Deleted

## 2019-04-08 DIAGNOSIS — I69392 Facial weakness following cerebral infarction: Secondary | ICD-10-CM | POA: Diagnosis not present

## 2019-04-08 DIAGNOSIS — M170 Bilateral primary osteoarthritis of knee: Secondary | ICD-10-CM | POA: Diagnosis not present

## 2019-04-08 DIAGNOSIS — M21372 Foot drop, left foot: Secondary | ICD-10-CM | POA: Diagnosis not present

## 2019-04-08 DIAGNOSIS — G43909 Migraine, unspecified, not intractable, without status migrainosus: Secondary | ICD-10-CM | POA: Diagnosis not present

## 2019-04-08 DIAGNOSIS — G8929 Other chronic pain: Secondary | ICD-10-CM | POA: Diagnosis not present

## 2019-04-08 DIAGNOSIS — R471 Dysarthria and anarthria: Secondary | ICD-10-CM | POA: Diagnosis not present

## 2019-04-08 DIAGNOSIS — R911 Solitary pulmonary nodule: Secondary | ICD-10-CM | POA: Diagnosis not present

## 2019-04-08 DIAGNOSIS — E119 Type 2 diabetes mellitus without complications: Secondary | ICD-10-CM | POA: Diagnosis not present

## 2019-04-08 DIAGNOSIS — J449 Chronic obstructive pulmonary disease, unspecified: Secondary | ICD-10-CM | POA: Diagnosis not present

## 2019-04-08 DIAGNOSIS — I10 Essential (primary) hypertension: Secondary | ICD-10-CM | POA: Diagnosis not present

## 2019-04-08 DIAGNOSIS — M791 Myalgia, unspecified site: Secondary | ICD-10-CM | POA: Diagnosis not present

## 2019-04-08 DIAGNOSIS — I69354 Hemiplegia and hemiparesis following cerebral infarction affecting left non-dominant side: Secondary | ICD-10-CM | POA: Diagnosis not present

## 2019-04-08 DIAGNOSIS — M810 Age-related osteoporosis without current pathological fracture: Secondary | ICD-10-CM | POA: Diagnosis not present

## 2019-04-08 DIAGNOSIS — I69318 Other symptoms and signs involving cognitive functions following cerebral infarction: Secondary | ICD-10-CM | POA: Diagnosis not present

## 2019-04-08 DIAGNOSIS — Z48812 Encounter for surgical aftercare following surgery on the circulatory system: Secondary | ICD-10-CM | POA: Diagnosis not present

## 2019-04-08 DIAGNOSIS — I69398 Other sequelae of cerebral infarction: Secondary | ICD-10-CM | POA: Diagnosis not present

## 2019-04-08 DIAGNOSIS — M8580 Other specified disorders of bone density and structure, unspecified site: Secondary | ICD-10-CM | POA: Diagnosis not present

## 2019-04-08 DIAGNOSIS — K259 Gastric ulcer, unspecified as acute or chronic, without hemorrhage or perforation: Secondary | ICD-10-CM | POA: Diagnosis not present

## 2019-04-08 DIAGNOSIS — I959 Hypotension, unspecified: Secondary | ICD-10-CM | POA: Diagnosis not present

## 2019-04-08 DIAGNOSIS — G4700 Insomnia, unspecified: Secondary | ICD-10-CM | POA: Diagnosis not present

## 2019-04-08 DIAGNOSIS — G9340 Encephalopathy, unspecified: Secondary | ICD-10-CM | POA: Diagnosis not present

## 2019-04-08 DIAGNOSIS — E785 Hyperlipidemia, unspecified: Secondary | ICD-10-CM | POA: Diagnosis not present

## 2019-04-08 NOTE — Telephone Encounter (Signed)
Ms Buntain called because she had not heard from [facility] therapist and was wanting to switch. I called her back and they made contact after she called [place] and she has seen the OT and will see PT tomorrow. No change will be made.

## 2019-04-09 ENCOUNTER — Encounter
Payer: Medicare Other | Attending: Physical Medicine and Rehabilitation | Admitting: Physical Medicine and Rehabilitation

## 2019-04-09 ENCOUNTER — Other Ambulatory Visit: Payer: Self-pay

## 2019-04-09 ENCOUNTER — Encounter: Payer: Self-pay | Admitting: Physical Medicine and Rehabilitation

## 2019-04-09 VITALS — BP 110/73 | HR 84 | Temp 97.8°F | Ht 62.75 in | Wt 175.0 lb

## 2019-04-09 DIAGNOSIS — G8929 Other chronic pain: Secondary | ICD-10-CM | POA: Diagnosis not present

## 2019-04-09 DIAGNOSIS — J449 Chronic obstructive pulmonary disease, unspecified: Secondary | ICD-10-CM | POA: Diagnosis not present

## 2019-04-09 DIAGNOSIS — I959 Hypotension, unspecified: Secondary | ICD-10-CM | POA: Diagnosis not present

## 2019-04-09 DIAGNOSIS — M8580 Other specified disorders of bone density and structure, unspecified site: Secondary | ICD-10-CM | POA: Diagnosis not present

## 2019-04-09 DIAGNOSIS — I63511 Cerebral infarction due to unspecified occlusion or stenosis of right middle cerebral artery: Secondary | ICD-10-CM

## 2019-04-09 DIAGNOSIS — R911 Solitary pulmonary nodule: Secondary | ICD-10-CM | POA: Diagnosis not present

## 2019-04-09 DIAGNOSIS — R471 Dysarthria and anarthria: Secondary | ICD-10-CM | POA: Diagnosis not present

## 2019-04-09 DIAGNOSIS — K259 Gastric ulcer, unspecified as acute or chronic, without hemorrhage or perforation: Secondary | ICD-10-CM | POA: Diagnosis not present

## 2019-04-09 DIAGNOSIS — I69318 Other symptoms and signs involving cognitive functions following cerebral infarction: Secondary | ICD-10-CM | POA: Diagnosis not present

## 2019-04-09 DIAGNOSIS — M791 Myalgia, unspecified site: Secondary | ICD-10-CM | POA: Diagnosis not present

## 2019-04-09 DIAGNOSIS — G4700 Insomnia, unspecified: Secondary | ICD-10-CM | POA: Diagnosis not present

## 2019-04-09 DIAGNOSIS — Z48812 Encounter for surgical aftercare following surgery on the circulatory system: Secondary | ICD-10-CM | POA: Diagnosis not present

## 2019-04-09 DIAGNOSIS — M810 Age-related osteoporosis without current pathological fracture: Secondary | ICD-10-CM | POA: Diagnosis not present

## 2019-04-09 DIAGNOSIS — G43909 Migraine, unspecified, not intractable, without status migrainosus: Secondary | ICD-10-CM | POA: Diagnosis not present

## 2019-04-09 DIAGNOSIS — I10 Essential (primary) hypertension: Secondary | ICD-10-CM | POA: Diagnosis not present

## 2019-04-09 DIAGNOSIS — E119 Type 2 diabetes mellitus without complications: Secondary | ICD-10-CM | POA: Diagnosis not present

## 2019-04-09 DIAGNOSIS — I69392 Facial weakness following cerebral infarction: Secondary | ICD-10-CM | POA: Diagnosis not present

## 2019-04-09 DIAGNOSIS — E785 Hyperlipidemia, unspecified: Secondary | ICD-10-CM | POA: Diagnosis not present

## 2019-04-09 DIAGNOSIS — I69354 Hemiplegia and hemiparesis following cerebral infarction affecting left non-dominant side: Secondary | ICD-10-CM | POA: Diagnosis not present

## 2019-04-09 DIAGNOSIS — M170 Bilateral primary osteoarthritis of knee: Secondary | ICD-10-CM | POA: Diagnosis not present

## 2019-04-09 DIAGNOSIS — G9340 Encephalopathy, unspecified: Secondary | ICD-10-CM | POA: Diagnosis not present

## 2019-04-09 DIAGNOSIS — M21372 Foot drop, left foot: Secondary | ICD-10-CM | POA: Diagnosis not present

## 2019-04-09 DIAGNOSIS — I69398 Other sequelae of cerebral infarction: Secondary | ICD-10-CM | POA: Diagnosis not present

## 2019-04-09 MED ORDER — TEMAZEPAM 15 MG PO CAPS: 15.0000 mg | ORAL_CAPSULE | Freq: Every evening | ORAL | 0 refills | Status: DC | PRN

## 2019-04-09 NOTE — Progress Notes (Signed)
Subjective:    Patient ID: Ann Lopez, female    DOB: May 18, 1970, 48 y.o.   MRN: [id number]  HPI  Ann Lopez is a 48 year old woman who presents for transitional care follow-up after CIR admission for ischemic right middle cerebral artery stroke.  OT came for the first time yesterday and she was able to walk with him in the home with the walker. She has walked with her son once from her home to the end of the driveway. She has an appointment with PT today.   She has been using PRAFO boot at night. She has all her medications from hospital discharge except for Cassel which she would like, as she is having problems sleeping at night.   Her goal is to reach independence so she does not depend on her son and daughter-in-law.   She is no longer having headaches and would like to stop the Topiramate as it is making her sleepy.   Pain Inventory Average Pain 6 Pain Right Now 4 My pain is constant and aching  In the last 24 hours, has pain interfered with the following? General activity 0 Relation with others 0 Enjoyment of life 0 What TIME of day is your pain at its worst? evening Sleep (in general) Poor  Pain is worse with: inactivity Pain improves with: medication Relief from Meds: 9  Mobility use a walker do you drive?  no  Function disabled: date disabled 2009 I need assistance with the following:  dressing, bathing, toileting, meal prep, household duties and shopping  Neuro/Psych weakness trouble walking depression anxiety  Prior Studies Any changes since last visit?  no  Physicians involved in your care Any changes since last visit?  no   Family History  Problem Relation Age of Onset  . Cancer Mother 71       breast cancer  . Hypertension Sister   . Hypothyroidism Sister   . Cancer Sister   . Hypothyroidism Brother   . Cancer Maternal Grandmother 8       breast cancer  . Cancer Maternal Grandfather 67       pancreatic cancer   Social History    Socioeconomic History  . Marital status: Single    Spouse name: Not on file  . Number of children: 4  . Years of education: 44  . Highest education level: Not on file  Occupational History  . Occupation: Disabled  Tobacco Use  . Smoking status: Current Some Day Smoker    Packs/day: 0.50    Types: Cigarettes  . Smokeless tobacco: Never Used  Substance and Sexual Activity  . Alcohol use: No    Alcohol/week: 0.0 standard drinks  . Drug use: No  . Sexual activity: Not Currently  Other Topics Concern  . Not on file  Social History Narrative   Marital status: single      Lives: at home with her four sons (12, 81, 74, 58); no grandchildren      Employment: disability for breast cancer, DDD lumbar, anxiety/panic attacks      Tobacco: 1 ppd       Alcohol: none         Right-handed.   2-3 cups caffeine daily.   Social Determinants of Health   Financial Resource Strain:   . Difficulty of Paying Living Expenses: Not on file  Food Insecurity:   . Worried About Charity fundraiser in the Last Year: Not on file  . Ran Out of Food in the  Last Year: Not on file  Transportation Needs:   . Lack of Transportation (Medical): Not on file  . Lack of Transportation (Non-Medical): Not on file  Physical Activity:   . Days of Exercise per Week: Not on file  . Minutes of Exercise per Session: Not on file  Stress:   . Feeling of Stress : Not on file  Social Connections:   . Frequency of Communication with Friends and Family: Not on file  . Frequency of Social Gatherings with Friends and Family: Not on file  . Attends Religious Services: Not on file  . Active Member of Clubs or Organizations: Not on file  . Attends Archivist Meetings: Not on file  . Marital Status: Not on file   Past Surgical History:  Procedure Laterality Date  . ABDOMINAL HYSTERECTOMY     B oophorectomy for BRCA1 gene  . BREAST SURGERY    . CESAREAN SECTION    . INCISION AND DRAINAGE ABSCESS Left  11/14/2012   Procedure: INCISION AND DRAINAGE ABSCESS LEFT GROIN;  Surgeon: Jamesetta So, MD;  Location: [facility];  Service: General;  Laterality: Left;  . IR ANGIO VERTEBRAL SEL SUBCLAVIAN INNOMINATE UNI R MOD SED  03/02/2019  . IR CT HEAD LTD  03/02/2019  . IR INTRAVSC STENT CERV CAROTID W/O EMB-PROT MOD SED INC ANGIO  03/02/2019  . IR PERCUTANEOUS ART THROMBECTOMY/INFUSION INTRACRANIAL INC DIAG ANGIO  03/02/2019  . LYMPHADENECTOMY    . MASTECTOMY Bilateral   . RADIOLOGY WITH ANESTHESIA N/A 03/02/2019   Procedure: IR WITH ANESTHESIA;  Surgeon: Luanne Bras, MD;  Location: [facility];  Service: Radiology;  Laterality: N/A;   Past Medical History:  Diagnosis Date  . Allergy   . Anxiety   . Asthma   . Back pain   . Breast cancer ([place])   . Cancer ([place])   . Depression   . Diabetes mellitus   . Headache   . Hyperlipemia   . Osteopenia   . Osteoporosis    osteopenia   There were no vitals taken for this visit.  Opioid Risk Score:   Fall Risk Score:  `1  Depression screen PHQ 2/9  Depression screen [facility] 2/9 01/04/2016 12/06/2015 10/27/2015 09/29/2015 08/30/2015 07/28/2015 06/28/2015  Decreased Interest 0 0 0 0 0 0 0  Down, Depressed, Hopeless 0 0 1 0 0 0 0  PHQ - 2 Score 0 0 1 0 0 0 0  Altered sleeping - - - - - - -  Tired, decreased energy - - - - - - -  Change in appetite - - - - - - -  Feeling bad or failure about yourself  - - - - - - -  Trouble concentrating - - - - - - -  Moving slowly or fidgety/restless - - - - - - -  Suicidal thoughts - - - - - - -  PHQ-9 Score - - - - - - -  Difficult doing work/chores - - - - - - -     Review of Systems  Constitutional: Negative.   HENT: Negative.   Eyes: Negative.   Respiratory: Negative.   Cardiovascular: Negative.   Gastrointestinal: Negative.   Endocrine: Negative.   Genitourinary: Negative.   Musculoskeletal: Positive for gait problem.  Skin: Negative.   Allergic/Immunologic: Negative.   Neurological: Positive for  weakness.  Hematological: Negative.   Psychiatric/Behavioral: Positive for dysphoric mood. The patient is nervous/anxious.   All other systems reviewed and are negative.  Objective:   Physical Exam Constitutional: No distress . Vital signs reviewed. HENT: Normocephalic.  Atraumatic. Eyes: EOMI. No discharge. Cardiovascular: No JVD. Respiratory: Normal effort.  No stridor. GI: Non-distended. Skin: Warm and dry.  Intact. Psych: Normal mood.  Normal behavior. Musc: No edema in extremities.  No tenderness in extremities. Neuro: Alert Motor: RUE/RLE: 5/5 proximal distal LUE: 0/5 proximal distal, unchanged LLE: Hip flexion, knee extension 4/5, ankle dorsiflexion 3/5      Assessment & Plan:  1. Functional deficits secondary to Right MCA infarct  --Continue home OT and PT with eventual transition to outpatient therapies. Patient stated goal is to return to ambulation and be less dependent on her children.  2) Depression --much improved. Patient loves her psychiatrist and is happy to be home. Continue Cymblata which seems to be helping with depression.  3)Headaches -Resolved. Can stop Topamax, which is causing lethargy.  4) Insomnia: --Will restart Temazepam which allowed her to sleep well in the hospital.   Thirty minutes of face to face patient care time were spent during this visit. All questions were encouraged and answered. Follow up with me in 4 weeks.

## 2019-04-12 NOTE — Progress Notes (Signed)
Cardiology Office Note:   Date:  04/13/2019  NAME:  Ann Lopez    MRN: [id number] DOB:  Feb 26, 1971   PCP:  Hayden Rasmussen, MD  Cardiologist:  Evalina Field, MD   Referring MD: Bary Leriche, PA-C   Chief Complaint  Patient presents with  . Abnormal ECG   History of Present Illness:   Ann Lopez is a 49 y.o. female with a hx of stroke, DM, HLD who is being seen today for the evaluation of stroke at the request of Love, Ivan Anchors, PA-C. Recent R MCA stroke in November 2020 and s/p R ICA stent placement.  She reports she is recovering well from her stroke.  She is quit smoking.  She is continuing to work on her diet.  She also has cut out sodas.  Her diabetes is not that well controlled with an A1c of 9.0.  She is continue to work with physical therapy twice per week for 1 hour sessions.  She reports no chest pain or trouble breathing with this.  She has a history of low blood pressure and has never had hypertension.  Her neurologist is requested a 30-day event monitor which we will order today.  I discussed with her about her hypertriglycerides.  She has had these for many years.  She seems to be taking fenofibrate.  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  She smoked for a number of years recently quit.  No history of myocardial infarction.  Her stroke has been attributed to carotid artery disease.  She reports that her left lower extremity can become swollen.  It appears unremarkable on examination today.  Right lower extremity is normal.  Problem List 1. R MCA CVA 2/2 R ICA stenosis s/p stent placement 03/02/2019 2. Diabetes (A1c 9.0) 3. Hypertriglyceridemia (HDL 23, LDL 49, TG 515, T chol 166) 4. Tobacco Abuse   Past Medical History: Past Medical History:  Diagnosis Date  . Allergy   . Anxiety   . Asthma   . Back pain   . Breast cancer ([place])   . Cancer ([place])   . Depression   . Diabetes mellitus   . Headache   . Hyperlipemia   . Osteopenia   .  Osteoporosis    osteopenia    Past Surgical History: Past Surgical History:  Procedure Laterality Date  . ABDOMINAL HYSTERECTOMY     B oophorectomy for BRCA1 gene  . BREAST SURGERY    . CESAREAN SECTION    . INCISION AND DRAINAGE ABSCESS Left 11/14/2012   Procedure: INCISION AND DRAINAGE ABSCESS LEFT GROIN;  Surgeon: Jamesetta So, MD;  Location: [facility];  Service: General;  Laterality: Left;  . IR ANGIO VERTEBRAL SEL SUBCLAVIAN INNOMINATE UNI R MOD SED  03/02/2019  . IR CT HEAD LTD  03/02/2019  . IR INTRAVSC STENT CERV CAROTID W/O EMB-PROT MOD SED INC ANGIO  03/02/2019  . IR PERCUTANEOUS ART THROMBECTOMY/INFUSION INTRACRANIAL INC DIAG ANGIO  03/02/2019  . LYMPHADENECTOMY    . MASTECTOMY Bilateral   . RADIOLOGY WITH ANESTHESIA N/A 03/02/2019   Procedure: IR WITH ANESTHESIA;  Surgeon: Luanne Bras, MD;  Location: [facility];  Service: Radiology;  Laterality: N/A;    Current Medications: No outpatient medications have been marked as taking for the 04/13/19 encounter (Office Visit) with Geralynn Rile, MD.     Allergies:    Azithromycin, Morphine and related, Nitrofurantoin, Penicillins, and Sulfonamide derivatives   Social History:  Social History   Socioeconomic History  . Marital status: Single    Spouse name: Not on file  . Number of children: 4  . Years of education: 56  . Highest education level: Not on file  Occupational History  . Occupation: Disabled  Tobacco Use  . Smoking status: Current Some Day Smoker    Packs/day: 0.50    Types: Cigarettes  . Smokeless tobacco: Never Used  Substance and Sexual Activity  . Alcohol use: No    Alcohol/week: 0.0 standard drinks  . Drug use: No  . Sexual activity: Not Currently  Other Topics Concern  . Not on file  Social History Narrative   Marital status: single      Lives: at home with her four sons (12, 33, 56, 32); no grandchildren      Employment: disability for breast cancer, DDD lumbar, anxiety/panic attacks       Tobacco: 1 ppd       Alcohol: none         Right-handed.   2-3 cups caffeine daily.   Social Determinants of Health   Financial Resource Strain:   . Difficulty of Paying Living Expenses: Not on file  Food Insecurity:   . Worried About Charity fundraiser in the Last Year: Not on file  . Ran Out of Food in the Last Year: Not on file  Transportation Needs:   . Lack of Transportation (Medical): Not on file  . Lack of Transportation (Non-Medical): Not on file  Physical Activity:   . Days of Exercise per Week: Not on file  . Minutes of Exercise per Session: Not on file  Stress:   . Feeling of Stress : Not on file  Social Connections:   . Frequency of Communication with Friends and Family: Not on file  . Frequency of Social Gatherings with Friends and Family: Not on file  . Attends Religious Services: Not on file  . Active Member of Clubs or Organizations: Not on file  . Attends Archivist Meetings: Not on file  . Marital Status: Not on file     Family History: The patient's family history includes Cancer in her sister; Cancer (age of onset: 17) in her mother; Cancer (age of onset: 82) in her maternal grandfather and maternal grandmother; Hypertension in her sister; Hypothyroidism in her brother and sister.  ROS:   All other ROS reviewed and negative. Pertinent positives noted in the HPI.     EKGs/Labs/Other Studies Reviewed:   The following studies were personally reviewed by me today:  EKG:  EKG is ordered today.  The ekg ordered today demonstrates normal sinus rhythm, heart rate 87, septal infarct noted, lateral infarct noted, no acute ST-T changes, and was personally reviewed by me.   TTE 03/03/2019  1. Left ventricular ejection fraction, by visual estimation, is 60 to 65%. The left ventricle has normal function. There is no left ventricular hypertrophy.  2. The left ventricle has no regional wall motion abnormalities.  3. Global right ventricle has normal  systolic function.The right ventricular size is normal. No increase in right ventricular wall thickness.  4. Left atrial size was normal.  5. Right atrial size was normal.  6. The mitral valve is normal in structure. No evidence of mitral valve regurgitation. No evidence of mitral stenosis.  7. The tricuspid valve is normal in structure. Tricuspid valve regurgitation is not demonstrated.  8. The aortic valve is normal in structure. Aortic valve regurgitation is not visualized.  No evidence of aortic valve sclerosis or stenosis.  9. The pulmonic valve was normal in structure. Pulmonic valve regurgitation is not visualized. 10. Normal pulmonary artery systolic pressure. 11. The inferior vena cava is dilated in size with >50% respiratory variability, suggesting right atrial pressure of 8 mmHg.  Recent Labs: 03/07/2019: Magnesium 1.9 03/10/2019: ALT 33 03/30/2019: BUN 13; Creatinine, Ser 0.75; Hemoglobin 15.6; Platelets 302; Potassium 3.9; Sodium 138   Recent Lipid Panel    Component Value Date/Time   CHOL 166 03/03/2019 0446   TRIG 515 (H) 03/03/2019 0446   HDL 23 (L) 03/03/2019 0446   CHOLHDL 7.2 03/03/2019 0446   VLDL UNABLE TO CALCULATE IF TRIGLYCERIDE OVER 400 mg/dL 03/03/2019 0446   LDLCALC UNABLE TO CALCULATE IF TRIGLYCERIDE OVER 400 mg/dL 03/03/2019 0446   LDLDIRECT 39.4 03/04/2019 1250    Physical Exam:   VS:  BP 112/74 (BP Location: Right Arm, Patient Position: Sitting, Cuff Size: Normal)   Pulse 87   Ht 5' 2"  (1.575 m)   Wt 168 lb 9.6 oz (76.5 kg)   SpO2 99%   BMI 30.84 kg/m    Wt Readings from Last 3 Encounters:  04/13/19 168 lb 9.6 oz (76.5 kg)  04/09/19 175 lb (79.4 kg)  03/28/19 181 lb 14.1 oz (82.5 kg)    General: Well nourished, well developed, in no acute distress Heart: Atraumatic, normal size  Eyes: PEERLA, EOMI  Neck: Supple, no JVD Endocrine: No thryomegaly Cardiac: Normal S1, S2; RRR; no murmurs, rubs, or gallops Lungs: Clear to auscultation bilaterally,  no wheezing, rhonchi or rales  Abd: Soft, nontender, no hepatomegaly  Ext: No edema, pulses 2+ Musculoskeletal: No deformities, BUE and BLE strength normal and equal Skin: Warm and dry, no rashes   Neuro: Alert and oriented to person, place, time, and situation, CNII-XII grossly intact, no focal deficits  Psych: Normal mood and affect   ASSESSMENT:   Ann Lopez is a 49 y.o. female who presents for the following: 1. Acute ischemic right middle cerebral artery (MCA) stroke ([place])   2. Pure hypertriglyceridemia   3. Tobacco abuse     PLAN:   1. Acute ischemic right middle cerebral artery (MCA) stroke ([facility]) -Status post right carotid artery stent in November 2020 -Remains on aspirin and ticagrelor.  She will follow with interventional radiology about the management of this. -Her stroke likely came from her carotid artery disease.  We will complete her work-up with a 30-day event monitor.  I highly suspect this will not show any atrial fibrillation. -Blood pressure well controlled today. -Needs to work on diabetes and she will do this -For her cholesterol I think is prudent for her to be on a statin we will add Crestor 40 mg daily.  I will add Vascepa 2 g twice daily to help with her triglycerides.  I think this will be a better regimen as she has known cardiovascular disease.  We will stop her fenofibrate.  2. Pure hypertriglyceridemia -Start Crestor 40 mg daily and Vascepa 2 g twice daily. -She was counseled extensively about fat reduction in her diet as well as to get better control of her diabetes.  This will help with her hypertriglycerides.  There likely is a genetic component but uncontrolled diabetes and poor diet will do this as well.  She also should refrain from alcohol -I will see her back in 2 months for virtual visit and we will recheck her cholesterol 1 week before that appointment  3. Tobacco abuse -Recently  quit smoking  Disposition: Return in about 2 months (around  06/11/2019).  Medication Adjustments/Labs and Tests Ordered: Current medicines are reviewed at length with the patient today.  Concerns regarding medicines are outlined above.  Orders Placed This Encounter  Procedures  . Cholesterol, Total  . Cardiac event monitor  . EKG 12-Lead   Meds ordered this encounter  Medications  . rosuvastatin (CRESTOR) 40 MG tablet    Sig: Take 1 tablet (40 mg total) by mouth daily.    Dispense:  90 tablet    Refill:  3  . icosapent Ethyl (VASCEPA) 1 g capsule    Sig: Take 2 capsules (2 g total) by mouth 2 (two) times daily.    Dispense:  180 capsule    Refill:  3    Patient Instructions  Medication Instructions:  Stop taking Fenofibrate. Start taking 98m Crestor Daily. Start taking 2g of Vescepa Twice Daily.  If you need a refill on your cardiac medications before your next appointment, please call your pharmacy.   Lab work: Cholesterol in 2 months before appointment.  If you have labs (blood work) drawn today and your tests are completely normal, you will receive your results only by: [facility](if you have MyChart) OR A paper copy in the mail If you have any lab test that is abnormal or we need to change your treatment, we will call you to review the results.  Testing/Procedures: Your physician has recommended that you wear an event monitor. Event monitors are medical devices that record the heart's electrical activity. Doctors most often uKoreathese monitors to diagnose arrhythmias. Arrhythmias are problems with the speed or rhythm of the heartbeat. The monitor is a small, portable device. You can wear one while you do your normal daily activities. This is usually used to diagnose what is causing palpitations/syncope (passing out).   Follow-Up: [facility] you and your health needs are our priority.  As part of our continuing mission to provide you with exceptional heart care, we have created designated Provider Care Teams.  These  Care Teams include your primary Cardiologist (physician) and Advanced Practice Providers (APPs -  Physician Assistants and Nurse Practitioners) who all work together to provide you with the care you need, when you need it. You may see Dr OAudie Boxor one of the following Advanced Practice Providers on your designated Care Team:    LKerin Ransom PA-C  [place] J[place]letta Memos [place] Your physician wants you to follow-up in: 2 months with a virtual visit  Any Other Special Instructions Will Be Listed Below (If Applicable).  [employer] Cardiac Event Monitor Instructions Your physician has requested you wear your cardiac event monitor for 30 days, (1-30). [employer] may call or text to confirm a shipping address. The monitor will be sent to a land address via UPS. [employer] will not ship a monitor to a PO BOX. It typically takes 3-5 days to receive your monitor after it has been enrolled. [employer] will assist with USPS tracking if your package is delayed. The telephone number for [employer] is [phone] Once you have received your monitor, please review the enclosed instructions. Instruction tutorials can also be viewed under help and settings on the enclosed cell phone. Your monitor has already been registered assigning a specific monitor serial # to you.  Applying the monitor Remove cell phone from case and turn it on. The cell phone works as yDealerand needs to be within 1Merrill Lynchof you at  all times. The cell phone will need to be charged on a daily basis. We recommend you plug the cell phone into the enclosed charger at your bedside table every night.  Monitor batteries: You will receive two monitor batteries labelled #1 and #2. These are your recorders. Plug battery #2 onto the second connection on the enclosed charger. Keep one battery on the charger at all times. This will keep the monitor battery deactivated. It will also keep it fully charged for when you  need to switch your monitor batteries. A small light will be blinking on the battery emblem when it is charging. The light on the battery emblem will remain on when the battery is fully charged.  Open package of a Monitor strip. Insert battery #1 into black hood on strip and gently squeeze monitor battery onto connection as indicated in instruction booklet. Set aside while preparing skin.  Choose location for your strip, vertical or horizontal, as indicated in the instruction booklet. Shave to remove all hair from location. There cannot be any lotions, oils, powders, or colognes on skin where monitor is to be applied. Wipe skin clean with enclosed Saline wipe. Dry skin completely.  Peel paper labeled #1 off the back of the Monitor strip exposing the adhesive. Place the monitor on the chest in the vertical or horizontal position shown in the instruction booklet. One arrow on the monitor strip must be pointing upward. Carefully remove paper labeled #2, attaching remainder of strip to your skin. Try not to create any folds or wrinkles in the strip as you apply it.  Firmly press and release the circle in the center of the monitor battery. You will hear a small beep. This is turning the monitor battery on. The heart emblem on the monitor battery will light up every 5 seconds if the monitor battery in turned on and connected to the patient securely. Do not push and hold the circle down as this turns the monitor battery off. The cell phone will locate the monitor battery. A screen will appear on the cell phone checking the connection of your monitor strip. This may read poor connection initially but change to good connection within the next minute. Once your monitor accepts the connection you will hear a series of 3 beeps followed by a climbing crescendo of beeps. A screen will appear on the cell phone showing the two monitor strip placement options. Touch the picture that demonstrates where you  applied the monitor strip.  Your monitor strip and battery are waterproof. You are able to shower, bathe, or swim with the monitor on. They just ask you do not submerge deeper than 3 feet underwater. We recommend removing the monitor if you are swimming in a lake, river, or ocean.  Your monitor battery will need to be switched to a fully charged monitor battery approximately once a week. The cell phone will alert you of an action which needs to be made.  On the cell phone, tap for details to reveal connection status, monitor battery status, and cell phone battery status. The green dots indicates your monitor is in good status. A red dot indicates there is something that needs your attention.  To record a symptom, click the circle on the monitor battery. In 30-60 seconds a list of symptoms will appear on the cell phone. Select your symptom and tap save. Your monitor will record a sustained or significant arrhythmia regardless of you clicking the button. Some patients do not feel the heart rhythm  irregularities. [employer] will notify us of any serious or critical events.  Refer to instruction booklet for instructions on switching batteries, changing strips, the Do not disturb or Pause features, or any additional questions.  Call [employer] at [phone], to confirm your monitor is transmitting and record your baseline. They will answer any questions you may have regarding the monitor instructions at that time.  Returning the monitor to [place] all equipment back into blue box. Peel off strip of paper to expose adhesive and close box securely. There is a prepaid UPS shipping label on this box. Drop in a UPS drop box, or at a UPS facility like [employer]. You may also contact [employer] to arrange UPS to pick up monitor package at your home.          Signed, Addison Naegeli. Audie Box, [place]  [address] [place], [place] [address] [phone]  04/13/2019 4:42 PM

## 2019-04-13 ENCOUNTER — Encounter: Payer: Self-pay | Admitting: *Deleted

## 2019-04-13 ENCOUNTER — Encounter: Payer: Self-pay | Admitting: Cardiovascular Disease

## 2019-04-13 ENCOUNTER — Other Ambulatory Visit: Payer: Self-pay

## 2019-04-13 ENCOUNTER — Ambulatory Visit (INDEPENDENT_AMBULATORY_CARE_PROVIDER_SITE_OTHER): Payer: Medicare Other | Admitting: Cardiovascular Disease

## 2019-04-13 VITALS — BP 112/74 | HR 87 | Ht 62.0 in | Wt 168.6 lb

## 2019-04-13 DIAGNOSIS — Z720 Tobacco use: Secondary | ICD-10-CM | POA: Diagnosis not present

## 2019-04-13 DIAGNOSIS — E781 Pure hyperglyceridemia: Secondary | ICD-10-CM

## 2019-04-13 DIAGNOSIS — I63511 Cerebral infarction due to unspecified occlusion or stenosis of right middle cerebral artery: Secondary | ICD-10-CM

## 2019-04-13 MED ORDER — ICOSAPENT ETHYL 1 G PO CAPS: 2.0000 g | ORAL_CAPSULE | Freq: Two times a day (BID) | ORAL | 3 refills | Status: DC

## 2019-04-13 MED ORDER — ROSUVASTATIN CALCIUM 40 MG PO TABS: 40.0000 mg | ORAL_TABLET | Freq: Every day | ORAL | 3 refills | Status: DC

## 2019-04-13 NOTE — Progress Notes (Signed)
Patient ID: Ann Lopez, female   DOB: 01/03/1971, 49 y.o.   MRN: [id number] Patient enrolled for [employer] to ship a 30 day cardiac event monitor to her sons address at [address] [place],  [address].

## 2019-04-13 NOTE — Patient Instructions (Signed)
Medication Instructions:  Stop taking Fenofibrate. Start taking 40mg  Crestor Daily. Start taking 2g of Vescepa Twice Daily.  If you need a refill on your cardiac medications before your next appointment, please call your pharmacy.   Lab work: Cholesterol in 2 months before appointment.  If you have labs (blood work) drawn today and your tests are completely normal, you will receive your results only by: [place] (if you have MyChart) OR A paper copy in the mail If you have any lab test that is abnormal or we need to change your treatment, we will call you to review the results.  Testing/Procedures: Your physician has recommended that you wear an event monitor. Event monitors are medical devices that record the heart's electrical activity. Doctors most often [place] these monitors to diagnose arrhythmias. Arrhythmias are problems with the speed or rhythm of the heartbeat. The monitor is a small, portable device. You can wear one while you do your normal daily activities. This is usually used to diagnose what is causing palpitations/syncope (passing out).   Follow-Up: At [facility], you and your health needs are our priority.  As part of our continuing mission to provide you with exceptional heart care, we have created designated Provider Care Teams.  These Care Teams include your primary Cardiologist (physician) and Advanced Practice Providers (APPs -  Physician Assistants and Nurse Practitioners) who all work together to provide you with the care you need, when you need it. You may see Dr Audie Box or one of the following Advanced Practice Providers on your designated Care Team:    Kerin Ransom, PA-C  [place], [place]  Coletta Memos, [place]  Your physician wants you to follow-up in: 2 months with a virtual visit  Any Other Special Instructions Will Be Listed Below (If Applicable).  [employer] Cardiac Event Monitor Instructions Your physician has requested you wear your cardiac  event monitor for 30 days, (1-30). [employer] may call or text to confirm a shipping address. The monitor will be sent to a land address via UPS. [employer] will not ship a monitor to a PO BOX. It typically takes 3-5 days to receive your monitor after it has been enrolled. [employer] will assist with USPS tracking if your package is delayed. The telephone number for [employer] is [phone]. Once you have received your monitor, please review the enclosed instructions. Instruction tutorials can also be viewed under help and settings on the enclosed cell phone. Your monitor has already been registered assigning a specific monitor serial # to you.  Applying the monitor Remove cell phone from case and turn it on. The cell phone works as Dealer and needs to be within [employer] of you at all times. The cell phone will need to be charged on a daily basis. We recommend you plug the cell phone into the enclosed charger at your bedside table every night.  Monitor batteries: You will receive two monitor batteries labelled #1 and #2. These are your recorders. Plug battery #2 onto the second connection on the enclosed charger. Keep one battery on the charger at all times. This will keep the monitor battery deactivated. It will also keep it fully charged for when you need to switch your monitor batteries. A small light will be blinking on the battery emblem when it is charging. The light on the battery emblem will remain on when the battery is fully charged.  Open package of a Monitor strip. Insert battery #1 into black hood on strip and gently squeeze monitor  battery onto connection as indicated in instruction booklet. Set aside while preparing skin.  Choose location for your strip, vertical or horizontal, as indicated in the instruction booklet. Shave to remove all hair from location. There cannot be any lotions, oils, powders, or colognes on skin where monitor is to be applied. Wipe skin  clean with enclosed Saline wipe. Dry skin completely.  Peel paper labeled #1 off the back of the Monitor strip exposing the adhesive. Place the monitor on the chest in the vertical or horizontal position shown in the instruction booklet. One arrow on the monitor strip must be pointing upward. Carefully remove paper labeled #2, attaching remainder of strip to your skin. Try not to create any folds or wrinkles in the strip as you apply it.  Firmly press and release the circle in the center of the monitor battery. You will hear a small beep. This is turning the monitor battery on. The heart emblem on the monitor battery will light up every 5 seconds if the monitor battery in turned on and connected to the patient securely. Do not push and hold the circle down as this turns the monitor battery off. The cell phone will locate the monitor battery. A screen will appear on the cell phone checking the connection of your monitor strip. This may read poor connection initially but change to good connection within the next minute. Once your monitor accepts the connection you will hear a series of 3 beeps followed by a climbing crescendo of beeps. A screen will appear on the cell phone showing the two monitor strip placement options. Touch the picture that demonstrates where you applied the monitor strip.  Your monitor strip and battery are waterproof. You are able to shower, bathe, or swim with the monitor on. They just ask you do not submerge deeper than 3 feet underwater. We recommend removing the monitor if you are swimming in a lake, river, or ocean.  Your monitor battery will need to be switched to a fully charged monitor battery approximately once a week. The cell phone will alert you of an action which needs to be made.  On the cell phone, tap for details to reveal connection status, monitor battery status, and cell phone battery status. The green dots indicates your monitor is in good status. A  red dot indicates there is something that needs your attention.  To record a symptom, click the circle on the monitor battery. In 30-60 seconds a list of symptoms will appear on the cell phone. Select your symptom and tap save. Your monitor will record a sustained or significant arrhythmia regardless of you clicking the button. Some patients do not feel the heart rhythm irregularities. [employer] will notify us of any serious or critical events.  Refer to instruction booklet for instructions on switching batteries, changing strips, the Do not disturb or Pause features, or any additional questions.  Call [employer] at [phone], to confirm your monitor is transmitting and record your baseline. They will answer any questions you may have regarding the monitor instructions at that time.  Returning the monitor to [place] all equipment back into blue box. Peel off strip of paper to expose adhesive and close box securely. There is a prepaid UPS shipping label on this box. Drop in a UPS drop box, or at a UPS facility like [employer]. You may also contact [employer] to arrange UPS to pick up monitor package at your home.

## 2019-04-14 DIAGNOSIS — E785 Hyperlipidemia, unspecified: Secondary | ICD-10-CM | POA: Diagnosis not present

## 2019-04-14 DIAGNOSIS — G9340 Encephalopathy, unspecified: Secondary | ICD-10-CM | POA: Diagnosis not present

## 2019-04-14 DIAGNOSIS — M170 Bilateral primary osteoarthritis of knee: Secondary | ICD-10-CM | POA: Diagnosis not present

## 2019-04-14 DIAGNOSIS — G43909 Migraine, unspecified, not intractable, without status migrainosus: Secondary | ICD-10-CM | POA: Diagnosis not present

## 2019-04-14 DIAGNOSIS — R471 Dysarthria and anarthria: Secondary | ICD-10-CM | POA: Diagnosis not present

## 2019-04-14 DIAGNOSIS — M791 Myalgia, unspecified site: Secondary | ICD-10-CM | POA: Diagnosis not present

## 2019-04-14 DIAGNOSIS — I69392 Facial weakness following cerebral infarction: Secondary | ICD-10-CM | POA: Diagnosis not present

## 2019-04-14 DIAGNOSIS — J449 Chronic obstructive pulmonary disease, unspecified: Secondary | ICD-10-CM | POA: Diagnosis not present

## 2019-04-14 DIAGNOSIS — E119 Type 2 diabetes mellitus without complications: Secondary | ICD-10-CM | POA: Diagnosis not present

## 2019-04-14 DIAGNOSIS — I69354 Hemiplegia and hemiparesis following cerebral infarction affecting left non-dominant side: Secondary | ICD-10-CM | POA: Diagnosis not present

## 2019-04-14 DIAGNOSIS — I10 Essential (primary) hypertension: Secondary | ICD-10-CM | POA: Diagnosis not present

## 2019-04-14 DIAGNOSIS — Z48812 Encounter for surgical aftercare following surgery on the circulatory system: Secondary | ICD-10-CM | POA: Diagnosis not present

## 2019-04-14 DIAGNOSIS — G8929 Other chronic pain: Secondary | ICD-10-CM | POA: Diagnosis not present

## 2019-04-14 DIAGNOSIS — I69398 Other sequelae of cerebral infarction: Secondary | ICD-10-CM | POA: Diagnosis not present

## 2019-04-14 DIAGNOSIS — M8580 Other specified disorders of bone density and structure, unspecified site: Secondary | ICD-10-CM | POA: Diagnosis not present

## 2019-04-14 DIAGNOSIS — M810 Age-related osteoporosis without current pathological fracture: Secondary | ICD-10-CM | POA: Diagnosis not present

## 2019-04-14 DIAGNOSIS — K259 Gastric ulcer, unspecified as acute or chronic, without hemorrhage or perforation: Secondary | ICD-10-CM | POA: Diagnosis not present

## 2019-04-14 DIAGNOSIS — I69318 Other symptoms and signs involving cognitive functions following cerebral infarction: Secondary | ICD-10-CM | POA: Diagnosis not present

## 2019-04-14 DIAGNOSIS — M21372 Foot drop, left foot: Secondary | ICD-10-CM | POA: Diagnosis not present

## 2019-04-14 DIAGNOSIS — I959 Hypotension, unspecified: Secondary | ICD-10-CM | POA: Diagnosis not present

## 2019-04-14 DIAGNOSIS — R911 Solitary pulmonary nodule: Secondary | ICD-10-CM | POA: Diagnosis not present

## 2019-04-14 DIAGNOSIS — G4700 Insomnia, unspecified: Secondary | ICD-10-CM | POA: Diagnosis not present

## 2019-04-15 DIAGNOSIS — E781 Pure hyperglyceridemia: Secondary | ICD-10-CM | POA: Diagnosis not present

## 2019-04-15 DIAGNOSIS — E119 Type 2 diabetes mellitus without complications: Secondary | ICD-10-CM | POA: Diagnosis not present

## 2019-04-16 DIAGNOSIS — G8929 Other chronic pain: Secondary | ICD-10-CM | POA: Diagnosis not present

## 2019-04-16 DIAGNOSIS — I959 Hypotension, unspecified: Secondary | ICD-10-CM | POA: Diagnosis not present

## 2019-04-16 DIAGNOSIS — G4700 Insomnia, unspecified: Secondary | ICD-10-CM | POA: Diagnosis not present

## 2019-04-16 DIAGNOSIS — I69354 Hemiplegia and hemiparesis following cerebral infarction affecting left non-dominant side: Secondary | ICD-10-CM | POA: Diagnosis not present

## 2019-04-16 DIAGNOSIS — R911 Solitary pulmonary nodule: Secondary | ICD-10-CM | POA: Diagnosis not present

## 2019-04-16 DIAGNOSIS — J449 Chronic obstructive pulmonary disease, unspecified: Secondary | ICD-10-CM | POA: Diagnosis not present

## 2019-04-16 DIAGNOSIS — I69392 Facial weakness following cerebral infarction: Secondary | ICD-10-CM | POA: Diagnosis not present

## 2019-04-16 DIAGNOSIS — M21372 Foot drop, left foot: Secondary | ICD-10-CM | POA: Diagnosis not present

## 2019-04-16 DIAGNOSIS — E119 Type 2 diabetes mellitus without complications: Secondary | ICD-10-CM | POA: Diagnosis not present

## 2019-04-16 DIAGNOSIS — E785 Hyperlipidemia, unspecified: Secondary | ICD-10-CM | POA: Diagnosis not present

## 2019-04-16 DIAGNOSIS — Z48812 Encounter for surgical aftercare following surgery on the circulatory system: Secondary | ICD-10-CM | POA: Diagnosis not present

## 2019-04-16 DIAGNOSIS — M791 Myalgia, unspecified site: Secondary | ICD-10-CM | POA: Diagnosis not present

## 2019-04-16 DIAGNOSIS — G43909 Migraine, unspecified, not intractable, without status migrainosus: Secondary | ICD-10-CM | POA: Diagnosis not present

## 2019-04-16 DIAGNOSIS — R471 Dysarthria and anarthria: Secondary | ICD-10-CM | POA: Diagnosis not present

## 2019-04-16 DIAGNOSIS — I10 Essential (primary) hypertension: Secondary | ICD-10-CM | POA: Diagnosis not present

## 2019-04-16 DIAGNOSIS — I69398 Other sequelae of cerebral infarction: Secondary | ICD-10-CM | POA: Diagnosis not present

## 2019-04-16 DIAGNOSIS — M170 Bilateral primary osteoarthritis of knee: Secondary | ICD-10-CM | POA: Diagnosis not present

## 2019-04-16 DIAGNOSIS — M810 Age-related osteoporosis without current pathological fracture: Secondary | ICD-10-CM | POA: Diagnosis not present

## 2019-04-16 DIAGNOSIS — M8580 Other specified disorders of bone density and structure, unspecified site: Secondary | ICD-10-CM | POA: Diagnosis not present

## 2019-04-16 DIAGNOSIS — K259 Gastric ulcer, unspecified as acute or chronic, without hemorrhage or perforation: Secondary | ICD-10-CM | POA: Diagnosis not present

## 2019-04-16 DIAGNOSIS — I69318 Other symptoms and signs involving cognitive functions following cerebral infarction: Secondary | ICD-10-CM | POA: Diagnosis not present

## 2019-04-16 DIAGNOSIS — G9340 Encephalopathy, unspecified: Secondary | ICD-10-CM | POA: Diagnosis not present

## 2019-04-21 DIAGNOSIS — G4700 Insomnia, unspecified: Secondary | ICD-10-CM | POA: Diagnosis not present

## 2019-04-21 DIAGNOSIS — M170 Bilateral primary osteoarthritis of knee: Secondary | ICD-10-CM | POA: Diagnosis not present

## 2019-04-21 DIAGNOSIS — M810 Age-related osteoporosis without current pathological fracture: Secondary | ICD-10-CM | POA: Diagnosis not present

## 2019-04-21 DIAGNOSIS — I69398 Other sequelae of cerebral infarction: Secondary | ICD-10-CM | POA: Diagnosis not present

## 2019-04-21 DIAGNOSIS — M21372 Foot drop, left foot: Secondary | ICD-10-CM | POA: Diagnosis not present

## 2019-04-21 DIAGNOSIS — M791 Myalgia, unspecified site: Secondary | ICD-10-CM | POA: Diagnosis not present

## 2019-04-21 DIAGNOSIS — I10 Essential (primary) hypertension: Secondary | ICD-10-CM | POA: Diagnosis not present

## 2019-04-21 DIAGNOSIS — I69392 Facial weakness following cerebral infarction: Secondary | ICD-10-CM | POA: Diagnosis not present

## 2019-04-21 DIAGNOSIS — I959 Hypotension, unspecified: Secondary | ICD-10-CM | POA: Diagnosis not present

## 2019-04-21 DIAGNOSIS — J449 Chronic obstructive pulmonary disease, unspecified: Secondary | ICD-10-CM | POA: Diagnosis not present

## 2019-04-21 DIAGNOSIS — M8580 Other specified disorders of bone density and structure, unspecified site: Secondary | ICD-10-CM | POA: Diagnosis not present

## 2019-04-21 DIAGNOSIS — G8929 Other chronic pain: Secondary | ICD-10-CM | POA: Diagnosis not present

## 2019-04-21 DIAGNOSIS — I69354 Hemiplegia and hemiparesis following cerebral infarction affecting left non-dominant side: Secondary | ICD-10-CM | POA: Diagnosis not present

## 2019-04-21 DIAGNOSIS — I69318 Other symptoms and signs involving cognitive functions following cerebral infarction: Secondary | ICD-10-CM | POA: Diagnosis not present

## 2019-04-21 DIAGNOSIS — G9340 Encephalopathy, unspecified: Secondary | ICD-10-CM | POA: Diagnosis not present

## 2019-04-21 DIAGNOSIS — E785 Hyperlipidemia, unspecified: Secondary | ICD-10-CM | POA: Diagnosis not present

## 2019-04-21 DIAGNOSIS — R911 Solitary pulmonary nodule: Secondary | ICD-10-CM | POA: Diagnosis not present

## 2019-04-21 DIAGNOSIS — R471 Dysarthria and anarthria: Secondary | ICD-10-CM | POA: Diagnosis not present

## 2019-04-21 DIAGNOSIS — K259 Gastric ulcer, unspecified as acute or chronic, without hemorrhage or perforation: Secondary | ICD-10-CM | POA: Diagnosis not present

## 2019-04-21 DIAGNOSIS — G43909 Migraine, unspecified, not intractable, without status migrainosus: Secondary | ICD-10-CM | POA: Diagnosis not present

## 2019-04-21 DIAGNOSIS — E119 Type 2 diabetes mellitus without complications: Secondary | ICD-10-CM | POA: Diagnosis not present

## 2019-04-21 DIAGNOSIS — Z48812 Encounter for surgical aftercare following surgery on the circulatory system: Secondary | ICD-10-CM | POA: Diagnosis not present

## 2019-04-22 ENCOUNTER — Other Ambulatory Visit: Payer: Self-pay | Admitting: Physical Medicine and Rehabilitation

## 2019-04-23 DIAGNOSIS — I69318 Other symptoms and signs involving cognitive functions following cerebral infarction: Secondary | ICD-10-CM | POA: Diagnosis not present

## 2019-04-23 DIAGNOSIS — G43909 Migraine, unspecified, not intractable, without status migrainosus: Secondary | ICD-10-CM | POA: Diagnosis not present

## 2019-04-23 DIAGNOSIS — M21372 Foot drop, left foot: Secondary | ICD-10-CM | POA: Diagnosis not present

## 2019-04-23 DIAGNOSIS — I10 Essential (primary) hypertension: Secondary | ICD-10-CM | POA: Diagnosis not present

## 2019-04-23 DIAGNOSIS — I959 Hypotension, unspecified: Secondary | ICD-10-CM | POA: Diagnosis not present

## 2019-04-23 DIAGNOSIS — I69354 Hemiplegia and hemiparesis following cerebral infarction affecting left non-dominant side: Secondary | ICD-10-CM | POA: Diagnosis not present

## 2019-04-23 DIAGNOSIS — M810 Age-related osteoporosis without current pathological fracture: Secondary | ICD-10-CM | POA: Diagnosis not present

## 2019-04-23 DIAGNOSIS — K259 Gastric ulcer, unspecified as acute or chronic, without hemorrhage or perforation: Secondary | ICD-10-CM | POA: Diagnosis not present

## 2019-04-23 DIAGNOSIS — J449 Chronic obstructive pulmonary disease, unspecified: Secondary | ICD-10-CM | POA: Diagnosis not present

## 2019-04-23 DIAGNOSIS — M8580 Other specified disorders of bone density and structure, unspecified site: Secondary | ICD-10-CM | POA: Diagnosis not present

## 2019-04-23 DIAGNOSIS — M791 Myalgia, unspecified site: Secondary | ICD-10-CM | POA: Diagnosis not present

## 2019-04-23 DIAGNOSIS — M170 Bilateral primary osteoarthritis of knee: Secondary | ICD-10-CM | POA: Diagnosis not present

## 2019-04-23 DIAGNOSIS — G8929 Other chronic pain: Secondary | ICD-10-CM | POA: Diagnosis not present

## 2019-04-23 DIAGNOSIS — R471 Dysarthria and anarthria: Secondary | ICD-10-CM | POA: Diagnosis not present

## 2019-04-23 DIAGNOSIS — E785 Hyperlipidemia, unspecified: Secondary | ICD-10-CM | POA: Diagnosis not present

## 2019-04-23 DIAGNOSIS — R911 Solitary pulmonary nodule: Secondary | ICD-10-CM | POA: Diagnosis not present

## 2019-04-23 DIAGNOSIS — Z48812 Encounter for surgical aftercare following surgery on the circulatory system: Secondary | ICD-10-CM | POA: Diagnosis not present

## 2019-04-23 DIAGNOSIS — I69392 Facial weakness following cerebral infarction: Secondary | ICD-10-CM | POA: Diagnosis not present

## 2019-04-23 DIAGNOSIS — E119 Type 2 diabetes mellitus without complications: Secondary | ICD-10-CM | POA: Diagnosis not present

## 2019-04-23 DIAGNOSIS — G9340 Encephalopathy, unspecified: Secondary | ICD-10-CM | POA: Diagnosis not present

## 2019-04-23 DIAGNOSIS — G4700 Insomnia, unspecified: Secondary | ICD-10-CM | POA: Diagnosis not present

## 2019-04-23 DIAGNOSIS — I69398 Other sequelae of cerebral infarction: Secondary | ICD-10-CM | POA: Diagnosis not present

## 2019-04-28 ENCOUNTER — Ambulatory Visit (HOSPITAL_COMMUNITY)
Admission: RE | Admit: 2019-04-28 | Discharge: 2019-04-28 | Disposition: A | Discharge status: Home / Self Care | Payer: Medicare Other | Source: Ambulatory Visit | Attending: Student | Admitting: Student

## 2019-04-28 ENCOUNTER — Other Ambulatory Visit: Payer: Self-pay

## 2019-04-28 DIAGNOSIS — I69398 Other sequelae of cerebral infarction: Secondary | ICD-10-CM | POA: Diagnosis not present

## 2019-04-28 DIAGNOSIS — K259 Gastric ulcer, unspecified as acute or chronic, without hemorrhage or perforation: Secondary | ICD-10-CM | POA: Diagnosis not present

## 2019-04-28 DIAGNOSIS — Z48812 Encounter for surgical aftercare following surgery on the circulatory system: Secondary | ICD-10-CM | POA: Diagnosis not present

## 2019-04-28 DIAGNOSIS — I639 Cerebral infarction, unspecified: Secondary | ICD-10-CM

## 2019-04-28 DIAGNOSIS — R911 Solitary pulmonary nodule: Secondary | ICD-10-CM | POA: Diagnosis not present

## 2019-04-28 DIAGNOSIS — I69354 Hemiplegia and hemiparesis following cerebral infarction affecting left non-dominant side: Secondary | ICD-10-CM | POA: Diagnosis not present

## 2019-04-28 DIAGNOSIS — I69392 Facial weakness following cerebral infarction: Secondary | ICD-10-CM | POA: Diagnosis not present

## 2019-04-28 DIAGNOSIS — M8580 Other specified disorders of bone density and structure, unspecified site: Secondary | ICD-10-CM | POA: Diagnosis not present

## 2019-04-28 DIAGNOSIS — E785 Hyperlipidemia, unspecified: Secondary | ICD-10-CM | POA: Diagnosis not present

## 2019-04-28 DIAGNOSIS — M21372 Foot drop, left foot: Secondary | ICD-10-CM | POA: Diagnosis not present

## 2019-04-28 DIAGNOSIS — G4700 Insomnia, unspecified: Secondary | ICD-10-CM | POA: Diagnosis not present

## 2019-04-28 DIAGNOSIS — M170 Bilateral primary osteoarthritis of knee: Secondary | ICD-10-CM | POA: Diagnosis not present

## 2019-04-28 DIAGNOSIS — I69318 Other symptoms and signs involving cognitive functions following cerebral infarction: Secondary | ICD-10-CM | POA: Diagnosis not present

## 2019-04-28 DIAGNOSIS — R471 Dysarthria and anarthria: Secondary | ICD-10-CM | POA: Diagnosis not present

## 2019-04-28 DIAGNOSIS — G8929 Other chronic pain: Secondary | ICD-10-CM | POA: Diagnosis not present

## 2019-04-28 DIAGNOSIS — I10 Essential (primary) hypertension: Secondary | ICD-10-CM | POA: Diagnosis not present

## 2019-04-28 DIAGNOSIS — I959 Hypotension, unspecified: Secondary | ICD-10-CM | POA: Diagnosis not present

## 2019-04-28 DIAGNOSIS — J449 Chronic obstructive pulmonary disease, unspecified: Secondary | ICD-10-CM | POA: Diagnosis not present

## 2019-04-28 DIAGNOSIS — M791 Myalgia, unspecified site: Secondary | ICD-10-CM | POA: Diagnosis not present

## 2019-04-28 DIAGNOSIS — G43909 Migraine, unspecified, not intractable, without status migrainosus: Secondary | ICD-10-CM | POA: Diagnosis not present

## 2019-04-28 DIAGNOSIS — E119 Type 2 diabetes mellitus without complications: Secondary | ICD-10-CM | POA: Diagnosis not present

## 2019-04-28 DIAGNOSIS — M810 Age-related osteoporosis without current pathological fracture: Secondary | ICD-10-CM | POA: Diagnosis not present

## 2019-04-28 DIAGNOSIS — G9340 Encephalopathy, unspecified: Secondary | ICD-10-CM | POA: Diagnosis not present

## 2019-04-28 NOTE — Progress Notes (Signed)
Chief Complaint: Patient was seen in consultation today for proximal right ICA stenosis s/p revascularization.  Referring Physician(s): Code Stroke- Greta Doom  Supervising Physician: Luanne Bras  Patient Status: [facility] - Out-pt  History of Present Illness: Ann Lopez is a 49 y.o. female with a past medical history as below, with pertinent past medical history including hyperlipidemia, asthma, CVA 02/2019, diabetes mellitus, breast cancer, osteopenia, anxiety, depression, and prior tobacco abuse. She is known to [facility] and has been followed by Dr. Estanislado Pandy since 02/2019. She first presented to our department at the request of Dr. Leonel Ramsay as an active code stroke. She underwent an image-guided cerebral arteriogram with emergent mechanical thrombectomy of right ICA terminus occlusion, right MCA occlusion, and right ACA A1 occlusions achieving a TICI 2C revascularization along with stent assisted angioplasty of proximal right ICA severe stenosis 03/02/2019 by Dr. Estanislado Pandy. She was discharged to [facility] 03/09/2019 and discharged home 03/31/2019.  Patient presents today for follow-up regarding her recent procedure 03/02/2019. Patient awake and alert sitting in wheelchair. Accompanied by son. Complains of LUE weakness, improved since discharge. States she sees PT/OT for this. Denies headache, numbness/tingling, dizziness, vision changes, hearing changes, tinnitus, or speech difficulty.  Currently taking Brilinta 90 mg twice daily and Aspirin 81 mg once daily.   Past Medical History:  Diagnosis Date   Allergy    Anxiety    Asthma    Back pain    Breast cancer ([place])    Cancer ([place])    Depression    Diabetes mellitus    Headache    Hyperlipemia    Osteopenia    Osteoporosis    osteopenia    Past Surgical History:  Procedure Laterality Date   ABDOMINAL HYSTERECTOMY     B oophorectomy for BRCA1 gene   BREAST SURGERY     CESAREAN SECTION      INCISION AND DRAINAGE ABSCESS Left 11/14/2012   Procedure: INCISION AND DRAINAGE ABSCESS LEFT GROIN;  Surgeon: Jamesetta So, MD;  Location: [facility];  Service: General;  Laterality: Left;   IR ANGIO VERTEBRAL SEL SUBCLAVIAN INNOMINATE UNI R MOD SED  03/02/2019   IR CT HEAD LTD  03/02/2019   IR INTRAVSC STENT CERV CAROTID W/O EMB-PROT MOD SED INC ANGIO  03/02/2019   IR PERCUTANEOUS ART THROMBECTOMY/INFUSION INTRACRANIAL INC DIAG ANGIO  03/02/2019   LYMPHADENECTOMY     MASTECTOMY Bilateral    RADIOLOGY WITH ANESTHESIA N/A 03/02/2019   Procedure: IR WITH ANESTHESIA;  Surgeon: Luanne Bras, MD;  Location: [place];  Service: Radiology;  Laterality: N/A;    Allergies: Azithromycin, Morphine and related, Nitrofurantoin, Penicillins, and Sulfonamide derivatives  Medications: Prior to Admission medications   Medication Sig Start Date End Date Taking? Authorizing Provider  acetaminophen (TYLENOL) 325 MG tablet Take 1-2 tablets (325-650 mg total) by mouth every 4 (four) hours as needed for mild pain. 03/31/19   Love, Ivan Anchors, PA-C  albuterol (PROVENTIL HFA;VENTOLIN HFA) 108 (90 Base) MCG/ACT inhaler Inhale 1 puff into the lungs every 6 (six) hours as needed for wheezing or shortness of breath. 07/28/15   Robyn Haber, MD  aspirin EC 81 MG tablet Take 1 tablet (81 mg total) by mouth daily. 12/20/15   Kathie Dike, MD  canagliflozin (INVOKANA) 300 MG TABS tablet Take 1 tablet (300 mg total) by mouth daily before breakfast. 03/31/19   Love, Ivan Anchors, PA-C  clonazePAM (KLONOPIN) 0.5 MG tablet Take 0.5 tablets (0.25 mg total) by mouth 2 (two) times daily as needed  for anxiety. 03/31/19   Love, Ivan Anchors, PA-C  DULoxetine (CYMBALTA) 60 MG capsule TAKE 1 CAPSULE BY MOUTH EVERY DAY 04/22/19   Raulkar, Clide Deutscher, MD  glucose blood (CHOICE DM FORA G20 TEST STRIPS) test strip Use as instructed 10/27/15   Wardell Honour, MD  HYDROcodone-acetaminophen (NORCO/VICODIN) 5-325 MG tablet Take 1 tablet by  mouth every 6 (six) hours as needed for severe pain. 03/31/19   Love, Ivan Anchors, PA-C  icosapent Ethyl (VASCEPA) 1 g capsule Take 2 capsules (2 g total) by mouth 2 (two) times daily. 04/13/19   O'Neal, Cassie Freer, MD  Insulin Glargine (LANTUS) 100 UNIT/ML Solostar Pen Inject 15 Units into the skin daily. 03/31/19   Love, Ivan Anchors, PA-C  Insulin Pen Needle (PEN NEEDLES) 32G X 5 MM MISC 1 application by Does not apply route at bedtime. 03/31/19   Love, Ivan Anchors, PA-C  Lancets (ONETOUCH ULTRASOFT) lancets Use as instructed; check sugar before meals and at bedtime 03/31/19   Love, Pamela S, PA-C  mometasone (NASONEX) 50 MCG/ACT nasal spray Place 2 sprays into the nose daily. 01/20/15   Robyn Haber, MD  pantoprazole (PROTONIX) 40 MG tablet Take 1 tablet (40 mg total) by mouth daily. 03/31/19   Love, Ivan Anchors, PA-C  potassium chloride (KLOR-CON) 10 MEQ tablet Take 1 tablet (10 mEq total) by mouth 2 (two) times daily. 03/31/19   Love, Ivan Anchors, PA-C  rosuvastatin (CRESTOR) 40 MG tablet Take 1 tablet (40 mg total) by mouth daily. 04/13/19 07/12/19  O'NealCassie Freer, MD  saccharomyces boulardii (FLORASTOR) 250 MG capsule Take 1 capsule (250 mg total) by mouth 2 (two) times daily. 03/31/19   Love, Ivan Anchors, PA-C  senna-docusate (SENOKOT-S) 8.6-50 MG tablet Take 2 tablets by mouth at bedtime. 03/31/19   Love, Ivan Anchors, PA-C  temazepam (RESTORIL) 15 MG capsule Take 1 capsule (15 mg total) by mouth at bedtime as needed for sleep. 03/09/19   Regalado, Belkys A, MD  temazepam (RESTORIL) 15 MG capsule Take 1 capsule (15 mg total) by mouth at bedtime as needed for sleep. 04/09/19   Raulkar, Clide Deutscher, MD  ticagrelor (BRILINTA) 90 MG TABS tablet Take 1 tablet (90 mg total) by mouth 2 (two) times daily. 03/31/19   Love, Ivan Anchors, PA-C  fluticasone (FLONASE) 50 MCG/ACT nasal spray Place 2 sprays into the nose daily. Patient not taking: Reported on 03/10/2014 03/08/12 03/10/14  Robyn Haber, MD     Family  History  Problem Relation Age of Onset   Cancer Mother 48       breast cancer   Hypertension Sister    Hypothyroidism Sister    Cancer Sister    Hypothyroidism Brother    Cancer Maternal Grandmother 33       breast cancer   Cancer Maternal Grandfather 61       pancreatic cancer    Social History   Socioeconomic History   Marital status: Single    Spouse name: Not on file   Number of children: 4   Years of education: 13   Highest education level: Not on file  Occupational History   Occupation: Disabled  Tobacco Use   Smoking status: Current Some Day Smoker    Packs/day: 0.50    Types: Cigarettes   Smokeless tobacco: Never Used  Substance and Sexual Activity   Alcohol use: No    Alcohol/week: 0.0 standard drinks   Drug use: No   Sexual activity: Not Currently  Other Topics Concern  Not on file  Social History Narrative   Marital status: single      Lives: at home with her four sons (12, 60, 20, 50); no grandchildren      Employment: disability for breast cancer, DDD lumbar, anxiety/panic attacks      Tobacco: 1 ppd       Alcohol: none         Right-handed.   2-3 cups caffeine daily.   Social Determinants of Health   Financial Resource Strain:    Difficulty of Paying Living Expenses: Not on file  Food Insecurity:    Worried About Charity fundraiser in the Last Year: Not on file   [employer] of Food in the Last Year: Not on file  Transportation Needs:    Lack of Transportation (Medical): Not on file   Lack of Transportation (Non-Medical): Not on file  Physical Activity:    Days of Exercise per Week: Not on file   Minutes of Exercise per Session: Not on file  Stress:    Feeling of Stress : Not on file  Social Connections:    Frequency of Communication with Friends and Family: Not on file   Frequency of Social Gatherings with Friends and Family: Not on file   Attends Religious Services: Not on file   Active Member of Clubs or  Organizations: Not on file   Attends Archivist Meetings: Not on file   Marital Status: Not on file     Review of Systems: A 12 point ROS discussed and pertinent positives are indicated in the HPI above.  All other systems are negative.  Review of Systems  Constitutional: Negative for chills and fever.  HENT: Negative for hearing loss and tinnitus.   Respiratory: Negative for shortness of breath and wheezing.   Cardiovascular: Negative for chest pain and palpitations.  Neurological: Positive for weakness. Negative for dizziness, speech difficulty, numbness and headaches.  Psychiatric/Behavioral: Negative for behavioral problems and confusion.    Vital Signs: There were no vitals taken for this visit.  Physical Exam Constitutional:      General: She is not in acute distress.    Appearance: Normal appearance.  Pulmonary:     Effort: Pulmonary effort is normal. No respiratory distress.  Skin:    General: Skin is warm and dry.  Neurological:     Mental Status: She is alert and oriented to person, place, and time.     Comments: Can spontaneously move right side and LLE; Can straighten fingers of LUE and bend LUE at elbow.      Imaging: No results found.  Labs:  CBC: Recent Labs    03/16/19 0639 03/20/19 1531 03/23/19 0704 03/30/19 0719  WBC 9.3 9.7 6.9 6.7  HGB 16.2* 15.0 15.5* 15.6*  HCT 48.4* 45.2 47.7* 47.7*  PLT 368 319 299 302    COAGS: Recent Labs    03/02/19 0823  INR 1.0  APTT 27    BMP: Recent Labs    03/17/19 0555 03/20/19 1531 03/23/19 0704 03/30/19 0719  NA 136 137 139 138  K 3.6 3.2* 4.1 3.9  CL 103 105 108 106  CO2 22 22 23 23   GLUCOSE 93 141* 101* 131*  BUN 20 9 11 13   CALCIUM 9.2 9.1 9.3 9.5  CREATININE 0.75 0.91 0.70 0.75  GFRNONAA >60 >60 >60 >60  GFRAA >60 >60 >60 >60    LIVER FUNCTION TESTS: Recent Labs    03/03/19 0846 03/06/19 0312 03/07/19 0350  03/10/19 0441  BILITOT 0.8 1.5* 1.1 0.7  AST 17 28 20 25    ALT 17 36 28 33  ALKPHOS 47 87 98 88  PROT 5.9* 5.7* 6.1* 6.2*  ALBUMIN 3.1* 2.7* 2.9* 2.9*     Assessment and Plan:  Acute CVA s/p cerebral arteriogram with emergent mechanical thrombectomy of right ICA terminus occlusion, right MCA occlusion, and right ACA A1 occlusions achieving a TICI 2C revascularization along with stent assisted angioplasty of proximal right ICA severe stenosis 03/02/2019 by Dr. Estanislado Pandy. Dr. Estanislado Pandy was present for consultation.  Discussed patient's ADLs. Patient states that she can use the bathroom by herself. States she needs some assistance with bathing. States she needs full assist with cooking. Advised patient to follow-up with PT/OT regularly.   Discussed right ICA stent. Explained the best course of management for her stent is continued conservative management including continued DAPT and routine imaging scans to monitor for changes. Plan for follow-up with carotid US 6 months from procedure 03/02/2019. Informed patient that our schedulers will call her to set up this imaging scan. Instructed patient to continue taking Brilinta 90 mg twice daily and Aspirin 81 mg once daily.  Discussed secondary stroke prevention. Instructed patient to follow-up with PCP/cardiology regularly regarding hyperlipidemia/diabetes mellitus management. Instructed patient to follow-up with neurology regularly.  All questions answered and concerns addressed. Patient and son convey understanding and agree with plan.  Thank you for this interesting consult.  I greatly enjoyed meeting Ann Lopez and look forward to participating in their care.  A copy of this report was sent to the requesting provider on this date.  Electronically Signed: Earley Abide, PA-C 04/28/2019, 8:45 AM   I spent a total of 40 Minutes in face to face in clinical consultation, greater than 50% of which was counseling/coordinating care for proximal right ICA stenosis s/p revascularization.

## 2019-04-30 DIAGNOSIS — I69318 Other symptoms and signs involving cognitive functions following cerebral infarction: Secondary | ICD-10-CM | POA: Diagnosis not present

## 2019-04-30 DIAGNOSIS — M8580 Other specified disorders of bone density and structure, unspecified site: Secondary | ICD-10-CM | POA: Diagnosis not present

## 2019-04-30 DIAGNOSIS — E119 Type 2 diabetes mellitus without complications: Secondary | ICD-10-CM | POA: Diagnosis not present

## 2019-04-30 DIAGNOSIS — M791 Myalgia, unspecified site: Secondary | ICD-10-CM | POA: Diagnosis not present

## 2019-04-30 DIAGNOSIS — J449 Chronic obstructive pulmonary disease, unspecified: Secondary | ICD-10-CM | POA: Diagnosis not present

## 2019-04-30 DIAGNOSIS — I959 Hypotension, unspecified: Secondary | ICD-10-CM | POA: Diagnosis not present

## 2019-04-30 DIAGNOSIS — E785 Hyperlipidemia, unspecified: Secondary | ICD-10-CM | POA: Diagnosis not present

## 2019-04-30 DIAGNOSIS — R911 Solitary pulmonary nodule: Secondary | ICD-10-CM | POA: Diagnosis not present

## 2019-04-30 DIAGNOSIS — I69398 Other sequelae of cerebral infarction: Secondary | ICD-10-CM | POA: Diagnosis not present

## 2019-04-30 DIAGNOSIS — I63511 Cerebral infarction due to unspecified occlusion or stenosis of right middle cerebral artery: Secondary | ICD-10-CM | POA: Diagnosis not present

## 2019-04-30 DIAGNOSIS — G8929 Other chronic pain: Secondary | ICD-10-CM | POA: Diagnosis not present

## 2019-04-30 DIAGNOSIS — K259 Gastric ulcer, unspecified as acute or chronic, without hemorrhage or perforation: Secondary | ICD-10-CM | POA: Diagnosis not present

## 2019-04-30 DIAGNOSIS — G43909 Migraine, unspecified, not intractable, without status migrainosus: Secondary | ICD-10-CM | POA: Diagnosis not present

## 2019-04-30 DIAGNOSIS — Z853 Personal history of malignant neoplasm of breast: Secondary | ICD-10-CM | POA: Diagnosis not present

## 2019-04-30 DIAGNOSIS — G9340 Encephalopathy, unspecified: Secondary | ICD-10-CM | POA: Diagnosis not present

## 2019-04-30 DIAGNOSIS — M810 Age-related osteoporosis without current pathological fracture: Secondary | ICD-10-CM | POA: Diagnosis not present

## 2019-04-30 DIAGNOSIS — I69392 Facial weakness following cerebral infarction: Secondary | ICD-10-CM | POA: Diagnosis not present

## 2019-04-30 DIAGNOSIS — M170 Bilateral primary osteoarthritis of knee: Secondary | ICD-10-CM | POA: Diagnosis not present

## 2019-04-30 DIAGNOSIS — Z48812 Encounter for surgical aftercare following surgery on the circulatory system: Secondary | ICD-10-CM | POA: Diagnosis not present

## 2019-04-30 DIAGNOSIS — I10 Essential (primary) hypertension: Secondary | ICD-10-CM | POA: Diagnosis not present

## 2019-04-30 DIAGNOSIS — I69354 Hemiplegia and hemiparesis following cerebral infarction affecting left non-dominant side: Secondary | ICD-10-CM | POA: Diagnosis not present

## 2019-04-30 DIAGNOSIS — G4700 Insomnia, unspecified: Secondary | ICD-10-CM | POA: Diagnosis not present

## 2019-04-30 DIAGNOSIS — R471 Dysarthria and anarthria: Secondary | ICD-10-CM | POA: Diagnosis not present

## 2019-04-30 DIAGNOSIS — M21372 Foot drop, left foot: Secondary | ICD-10-CM | POA: Diagnosis not present

## 2019-05-01 DIAGNOSIS — I959 Hypotension, unspecified: Secondary | ICD-10-CM | POA: Diagnosis not present

## 2019-05-01 DIAGNOSIS — R471 Dysarthria and anarthria: Secondary | ICD-10-CM | POA: Diagnosis not present

## 2019-05-01 DIAGNOSIS — G4700 Insomnia, unspecified: Secondary | ICD-10-CM | POA: Diagnosis not present

## 2019-05-01 DIAGNOSIS — G43909 Migraine, unspecified, not intractable, without status migrainosus: Secondary | ICD-10-CM | POA: Diagnosis not present

## 2019-05-01 DIAGNOSIS — G8929 Other chronic pain: Secondary | ICD-10-CM | POA: Diagnosis not present

## 2019-05-01 DIAGNOSIS — E785 Hyperlipidemia, unspecified: Secondary | ICD-10-CM | POA: Diagnosis not present

## 2019-05-01 DIAGNOSIS — Z48812 Encounter for surgical aftercare following surgery on the circulatory system: Secondary | ICD-10-CM | POA: Diagnosis not present

## 2019-05-01 DIAGNOSIS — K259 Gastric ulcer, unspecified as acute or chronic, without hemorrhage or perforation: Secondary | ICD-10-CM | POA: Diagnosis not present

## 2019-05-01 DIAGNOSIS — G9340 Encephalopathy, unspecified: Secondary | ICD-10-CM | POA: Diagnosis not present

## 2019-05-01 DIAGNOSIS — I69392 Facial weakness following cerebral infarction: Secondary | ICD-10-CM | POA: Diagnosis not present

## 2019-05-01 DIAGNOSIS — R911 Solitary pulmonary nodule: Secondary | ICD-10-CM | POA: Diagnosis not present

## 2019-05-01 DIAGNOSIS — E119 Type 2 diabetes mellitus without complications: Secondary | ICD-10-CM | POA: Diagnosis not present

## 2019-05-01 DIAGNOSIS — I69354 Hemiplegia and hemiparesis following cerebral infarction affecting left non-dominant side: Secondary | ICD-10-CM | POA: Diagnosis not present

## 2019-05-01 DIAGNOSIS — M791 Myalgia, unspecified site: Secondary | ICD-10-CM | POA: Diagnosis not present

## 2019-05-01 DIAGNOSIS — I10 Essential (primary) hypertension: Secondary | ICD-10-CM | POA: Diagnosis not present

## 2019-05-01 DIAGNOSIS — M810 Age-related osteoporosis without current pathological fracture: Secondary | ICD-10-CM | POA: Diagnosis not present

## 2019-05-01 DIAGNOSIS — I69318 Other symptoms and signs involving cognitive functions following cerebral infarction: Secondary | ICD-10-CM | POA: Diagnosis not present

## 2019-05-01 DIAGNOSIS — M170 Bilateral primary osteoarthritis of knee: Secondary | ICD-10-CM | POA: Diagnosis not present

## 2019-05-01 DIAGNOSIS — M21372 Foot drop, left foot: Secondary | ICD-10-CM | POA: Diagnosis not present

## 2019-05-01 DIAGNOSIS — I69398 Other sequelae of cerebral infarction: Secondary | ICD-10-CM | POA: Diagnosis not present

## 2019-05-01 DIAGNOSIS — J449 Chronic obstructive pulmonary disease, unspecified: Secondary | ICD-10-CM | POA: Diagnosis not present

## 2019-05-01 DIAGNOSIS — M8580 Other specified disorders of bone density and structure, unspecified site: Secondary | ICD-10-CM | POA: Diagnosis not present

## 2019-05-03 ENCOUNTER — Emergency Department (HOSPITAL_COMMUNITY): Payer: Medicare Other

## 2019-05-03 ENCOUNTER — Other Ambulatory Visit: Payer: Self-pay

## 2019-05-03 ENCOUNTER — Encounter (HOSPITAL_COMMUNITY): Payer: Self-pay | Admitting: Emergency Medicine

## 2019-05-03 ENCOUNTER — Emergency Department (HOSPITAL_COMMUNITY)
Admission: EM | Admit: 2019-05-03 | Discharge: 2019-05-03 | Disposition: A | Discharge status: Home / Self Care | Payer: Medicare Other | Attending: Emergency Medicine | Admitting: Emergency Medicine

## 2019-05-03 DIAGNOSIS — R200 Anesthesia of skin: Secondary | ICD-10-CM | POA: Diagnosis not present

## 2019-05-03 DIAGNOSIS — E876 Hypokalemia: Secondary | ICD-10-CM | POA: Diagnosis not present

## 2019-05-03 DIAGNOSIS — J45909 Unspecified asthma, uncomplicated: Secondary | ICD-10-CM | POA: Insufficient documentation

## 2019-05-03 DIAGNOSIS — I69834 Monoplegia of upper limb following other cerebrovascular disease affecting left non-dominant side: Secondary | ICD-10-CM | POA: Diagnosis not present

## 2019-05-03 DIAGNOSIS — R531 Weakness: Secondary | ICD-10-CM | POA: Diagnosis not present

## 2019-05-03 DIAGNOSIS — Z794 Long term (current) use of insulin: Secondary | ICD-10-CM | POA: Diagnosis not present

## 2019-05-03 DIAGNOSIS — Z79899 Other long term (current) drug therapy: Secondary | ICD-10-CM | POA: Diagnosis not present

## 2019-05-03 DIAGNOSIS — I63419 Cerebral infarction due to embolism of unspecified middle cerebral artery: Secondary | ICD-10-CM | POA: Insufficient documentation

## 2019-05-03 DIAGNOSIS — Z7902 Long term (current) use of antithrombotics/antiplatelets: Secondary | ICD-10-CM | POA: Insufficient documentation

## 2019-05-03 DIAGNOSIS — Z7982 Long term (current) use of aspirin: Secondary | ICD-10-CM | POA: Diagnosis not present

## 2019-05-03 DIAGNOSIS — F17210 Nicotine dependence, cigarettes, uncomplicated: Secondary | ICD-10-CM | POA: Insufficient documentation

## 2019-05-03 DIAGNOSIS — E119 Type 2 diabetes mellitus without complications: Secondary | ICD-10-CM | POA: Diagnosis not present

## 2019-05-03 DIAGNOSIS — R29818 Other symptoms and signs involving the nervous system: Secondary | ICD-10-CM | POA: Diagnosis not present

## 2019-05-03 LAB — COMPREHENSIVE METABOLIC PANEL
ALT: 33 U/L (ref 0–44)
AST: 29 U/L (ref 15–41)
Albumin: 3.9 g/dL (ref 3.5–5.0)
Alkaline Phosphatase: 63 U/L (ref 38–126)
Anion gap: 9 (ref 5–15)
BUN: 10 mg/dL (ref 6–20)
CO2: 24 mmol/L (ref 22–32)
Calcium: 9.4 mg/dL (ref 8.9–10.3)
Chloride: 106 mmol/L (ref 98–111)
Creatinine, Ser: 0.97 mg/dL (ref 0.44–1.00)
GFR calc Af Amer: >60 mL/min (ref >60)
GFR calc non Af Amer: >60 mL/min (ref >60)
Glucose, Bld: 144 mg/dL — ABNORMAL HIGH (ref 70–99)
Potassium: 3.3 mmol/L — ABNORMAL LOW (ref 3.5–5.1)
Sodium: 139 mmol/L (ref 135–145)
Total Bilirubin: 0.5 mg/dL (ref 0.3–1.2)
Total Protein: 7.3 g/dL (ref 6.5–8.1)

## 2019-05-03 LAB — I-STAT CHEM 8, ED
BUN: 10 mg/dL (ref 6–20)
Calcium, Ion: 1.18 mmol/L (ref 1.15–1.40)
Chloride: 106 mmol/L (ref 98–111)
Creatinine, Ser: 0.9 mg/dL (ref 0.44–1.00)
Glucose, Bld: 137 mg/dL — ABNORMAL HIGH (ref 70–99)
HCT: 45 % (ref 36.0–46.0)
Hemoglobin: 15.3 g/dL — ABNORMAL HIGH (ref 12.0–15.0)
Potassium: 3.2 mmol/L — ABNORMAL LOW (ref 3.5–5.1)
Sodium: 140 mmol/L (ref 135–145)
TCO2: 25 mmol/L (ref 22–32)

## 2019-05-03 LAB — CBC
HCT: 46.5 % — ABNORMAL HIGH (ref 36.0–46.0)
Hemoglobin: 15.5 g/dL — ABNORMAL HIGH (ref 12.0–15.0)
MCH: 29.6 pg (ref 26.0–34.0)
MCHC: 33.3 g/dL (ref 30.0–36.0)
MCV: 88.7 fL (ref 80.0–100.0)
Platelets: 297 10*3/uL (ref 150–400)
RBC: 5.24 MIL/uL — ABNORMAL HIGH (ref 3.87–5.11)
RDW: 13.5 % (ref 11.5–15.5)
WBC: 8.7 10*3/uL (ref 4.0–10.5)
nRBC: 0 % (ref 0.0–0.2)

## 2019-05-03 LAB — DIFFERENTIAL
Abs Immature Granulocytes: 0.02 10*3/uL (ref 0.00–0.07)
Basophils Absolute: 0.1 10*3/uL (ref 0.0–0.1)
Basophils Relative: 1 %
Eosinophils Absolute: 0.2 10*3/uL (ref 0.0–0.5)
Eosinophils Relative: 3 %
Immature Granulocytes: 0 %
Lymphocytes Relative: 30 %
Lymphs Abs: 2.6 10*3/uL (ref 0.7–4.0)
Monocytes Absolute: 0.7 10*3/uL (ref 0.1–1.0)
Monocytes Relative: 7 %
Neutro Abs: 5.2 10*3/uL (ref 1.7–7.7)
Neutrophils Relative %: 59 %

## 2019-05-03 LAB — I-STAT BETA HCG BLOOD, ED (MC, WL, AP ONLY): I-stat hCG, quantitative: <5 m[IU]/mL (ref <5)

## 2019-05-03 LAB — APTT: aPTT: 29 seconds (ref 24–36)

## 2019-05-03 LAB — CBG MONITORING, ED: Glucose-Capillary: 129 mg/dL — ABNORMAL HIGH (ref 70–99)

## 2019-05-03 LAB — PROTIME-INR
INR: 1 (ref 0.8–1.2)
Prothrombin Time: 12.8 seconds (ref 11.4–15.2)

## 2019-05-03 MED ORDER — POTASSIUM CHLORIDE CRYS ER 20 MEQ PO TBCR
40.0000 meq | EXTENDED_RELEASE_TABLET | Freq: Once | ORAL | Status: AC
  Administered 2019-05-03: 40 meq via ORAL
  Filled 2019-05-03: qty 2

## 2019-05-03 MED ORDER — IOHEXOL 350 MG/ML SOLN
100.0000 mL | Freq: Once | INTRAVENOUS | Status: AC | PRN
  Administered 2019-05-03: 20:00:00 100 mL via INTRAVENOUS

## 2019-05-03 MED ORDER — SODIUM CHLORIDE 0.9% FLUSH
3.0000 mL | Freq: Once | INTRAVENOUS | Status: DC
Start: 2019-05-03 — End: 2019-05-04

## 2019-05-03 NOTE — ED Notes (Signed)
Discharge instructions discussed with pt. Pt verbalized understanding with no questions at this time. Pt to go home with son.  

## 2019-05-03 NOTE — ED Notes (Signed)
Pt transported to CT ?

## 2019-05-03 NOTE — ED Notes (Signed)
Pt in CT at this time.

## 2019-05-03 NOTE — Discharge Instructions (Signed)
Your images were reassuring today - there were no signs of a stroke today.  Your potassium was mildly low. We have repleted in the ED. Please take your potassium that you have at home. You will need to have your potassium level rechecked in 1-2 weeks.  Please follow up with your neurologist regarding your ED visit today.

## 2019-05-03 NOTE — ED Triage Notes (Signed)
Pt reports left arm heaviness and left foot numbness starting today around 1. Had a CVA in November that affected her left side but this heaviness is not normal. States she normally walks with a cane and her gait is normal.

## 2019-05-03 NOTE — ED Provider Notes (Signed)
[facility] EMERGENCY DEPARTMENT Provider Note   CSN: [id number] Arrival date & time: 05/03/19  1901     History Chief Complaint  Patient presents with   Weakness    Ann Lopez is a 49 y.o. female with PMHx anxiety, depression, diabetes, HLD who presents to the ED today with complaint of sudden onset, constant, heaviness to LUE and LLE that began around 1 PM today. Pt describes it as heaviness and decreased sensation however she is still able to ambulate with her walker. She denies headache, vision changes, confusion, speech difficulties, or any other associated symptoms.   Per chart review: Pt seen in the ED on 11/23 for code stroke with acute CVA requiring emergent mechanical thrombectomy of right ICA terminus occlusion, right MCA occlusion, and right ACA A1 occlusions achieving a TICI 2C revascularization along with stent assisted angioplasty of proximal right ICA severe stenosis 03/02/2019 by Dr. Estanislado Pandy.   Pt has been having residual LUE weakness since discharge and has been seeing PT and OT. She is currently on Brillinta 90 mg BID and Aspirin 81 mg po daily. She reports she has missed a couple of doses recently of her Brillinta.   The history is provided by the patient and medical records.       Past Medical History:  Diagnosis Date   Allergy    Anxiety    Asthma    Back pain    Breast cancer ([place])    Cancer ([place])    Depression    Diabetes mellitus    Headache    Hyperlipemia    Osteopenia    Osteoporosis    osteopenia    Patient Active Problem List   Diagnosis Date Noted   Vascular headache    Other chronic pain    Uncontrolled type 2 diabetes mellitus with hyperglycemia ([place])    Labile blood glucose    Panic disorder with agoraphobia and moderate panic attacks    Major depressive disorder, recurrent episode, moderate (HCC)    MCI (mild cognitive impairment) with memory loss    Acute ischemic right middle cerebral  artery (MCA) stroke ([place]) 03/09/2019   Left hemiparesis (HCC)    Bradycardia    Tachypnea    SIRS (systemic inflammatory response syndrome) (HCC)    Hypokalemia    Diabetes mellitus type 2 in obese ([place])    Tobacco abuse    History of breast cancer    Cognitive deficits    Dysphagia, post-stroke    Stroke (cerebrum) ([place]) 03/02/2019   Middle cerebral artery embolism, right 03/02/2019   Chest pain 12/19/2015   Numbness 12/19/2015   Femoral hernia 03/24/2014   Dyslipidemia 09/10/2011   BRCA1 positive 09/10/2011   Breast cancer ([place]) 09/10/2011   H/O gastric bypass; 05/14/11([facility]) 09/10/2011   Essential hypertension 10/28/2008   DEPRESSION/ANXIETY 02/19/2007   CARPAL TUNNEL SYNDROME, RIGHT 02/19/2007   ALLERGIC RHINITIS 02/19/2007   GERD 02/19/2007   IRRITABLE BOWEL SYNDROME 02/19/2007   HERPES GENITALIS 12/30/2006   Diabetes ([place]) 12/30/2006   OSTEOPENIA 12/30/2006   ANOMALY, CONGENITAL, NERVOUS SYSTEM NEC 12/30/2006    Past Surgical History:  Procedure Laterality Date   ABDOMINAL HYSTERECTOMY     B oophorectomy for BRCA1 gene   BREAST SURGERY     CESAREAN SECTION     INCISION AND DRAINAGE ABSCESS Left 11/14/2012   Procedure: INCISION AND DRAINAGE ABSCESS LEFT GROIN;  Surgeon: Jamesetta So, MD;  Location: [facility];  Service: General;  Laterality: Left;  IR ANGIO VERTEBRAL SEL SUBCLAVIAN INNOMINATE UNI R MOD SED  03/02/2019   IR CT HEAD LTD  03/02/2019   IR INTRAVSC STENT CERV CAROTID W/O EMB-PROT MOD SED INC ANGIO  03/02/2019   IR PERCUTANEOUS ART THROMBECTOMY/INFUSION INTRACRANIAL INC DIAG ANGIO  03/02/2019   LYMPHADENECTOMY     MASTECTOMY Bilateral    RADIOLOGY WITH ANESTHESIA N/A 03/02/2019   Procedure: IR WITH ANESTHESIA;  Surgeon: Luanne Bras, MD;  Location: [facility];  Service: Radiology;  Laterality: N/A;     OB History   No obstetric history on file.     Family History  Problem Relation Age of Onset   Cancer  Mother 84       breast cancer   Hypertension Sister    Hypothyroidism Sister    Cancer Sister    Hypothyroidism Brother    Cancer Maternal Grandmother 1       breast cancer   Cancer Maternal Grandfather 10       pancreatic cancer    Social History   Tobacco Use   Smoking status: Current Some Day Smoker    Packs/day: 0.50    Types: Cigarettes   Smokeless tobacco: Never Used  Substance Use Topics   Alcohol use: No    Alcohol/week: 0.0 standard drinks   Drug use: No    Home Medications Prior to Admission medications   Medication Sig Start Date End Date Taking? Authorizing Provider  acetaminophen (TYLENOL) 325 MG tablet Take 1-2 tablets (325-650 mg total) by mouth every 4 (four) hours as needed for mild pain. 03/31/19  Yes Love, Ivan Anchors, PA-C  albuterol (PROVENTIL HFA;VENTOLIN HFA) 108 (90 Base) MCG/ACT inhaler Inhale 1 puff into the lungs every 6 (six) hours as needed for wheezing or shortness of breath. 07/28/15  Yes Robyn Haber, MD  aspirin EC 81 MG tablet Take 1 tablet (81 mg total) by mouth daily. 12/20/15  Yes Kathie Dike, MD  canagliflozin (INVOKANA) 300 MG TABS tablet Take 1 tablet (300 mg total) by mouth daily before breakfast. 03/31/19  Yes Love, Ivan Anchors, PA-C  clonazePAM (KLONOPIN) 0.5 MG tablet Take 0.5 tablets (0.25 mg total) by mouth 2 (two) times daily as needed for anxiety. 03/31/19  Yes Love, Ivan Anchors, PA-C  docusate sodium (COLACE) 100 MG capsule Take 200 mg by mouth at bedtime.   Yes [provider]  DULoxetine (CYMBALTA) 60 MG capsule TAKE 1 CAPSULE BY MOUTH EVERY DAY Patient taking differently: Take 60 mg by mouth daily.  04/22/19  Yes Raulkar, Clide Deutscher, MD  HYDROcodone-acetaminophen (NORCO) 10-325 MG tablet Take 1 tablet by mouth every 6 (six) hours as needed for moderate pain.  04/07/19  Yes [provider]  icosapent Ethyl (VASCEPA) 1 g capsule Take 2 capsules (2 g total) by mouth 2 (two) times daily. 04/13/19  Yes O'Neal,  Cassie Freer, MD  Insulin Glargine (LANTUS) 100 UNIT/ML Solostar Pen Inject 15 Units into the skin daily. Patient taking differently: Inject 15 Units into the skin at bedtime.  03/31/19  Yes Love, Ivan Anchors, PA-C  pantoprazole (PROTONIX) 40 MG tablet Take 1 tablet (40 mg total) by mouth daily. 03/31/19  Yes Love, Ivan Anchors, PA-C  potassium chloride (KLOR-CON) 10 MEQ tablet Take 1 tablet (10 mEq total) by mouth 2 (two) times daily. 03/31/19  Yes Love, Ivan Anchors, PA-C  rosuvastatin (CRESTOR) 40 MG tablet Take 1 tablet (40 mg total) by mouth daily. 04/13/19 07/12/19 Yes O'Neal, Cassie Freer, MD  temazepam (RESTORIL) 15 MG capsule Take  1 capsule (15 mg total) by mouth at bedtime as needed for sleep. 04/09/19  Yes Raulkar, Clide Deutscher, MD  ticagrelor (BRILINTA) 90 MG TABS tablet Take 1 tablet (90 mg total) by mouth 2 (two) times daily. 03/31/19  Yes Love, Ivan Anchors, PA-C  glucose blood (CHOICE DM FORA G20 TEST STRIPS) test strip Use as instructed 10/27/15   Wardell Honour, MD  HYDROcodone-acetaminophen (NORCO/VICODIN) 5-325 MG tablet Take 1 tablet by mouth every 6 (six) hours as needed for severe pain. Patient not taking: Reported on 05/03/2019 03/31/19   Love, Ivan Anchors, PA-C  Insulin Pen Needle (PEN NEEDLES) 32G X 5 MM MISC 1 application by Does not apply route at bedtime. 03/31/19   Love, Ivan Anchors, PA-C  Lancets (ONETOUCH ULTRASOFT) lancets Use as instructed; check sugar before meals and at bedtime 03/31/19   Love, Pamela S, PA-C  mometasone (NASONEX) 50 MCG/ACT nasal spray Place 2 sprays into the nose daily. Patient not taking: Reported on 05/03/2019 01/20/15   Robyn Haber, MD  saccharomyces boulardii (FLORASTOR) 250 MG capsule Take 1 capsule (250 mg total) by mouth 2 (two) times daily. Patient not taking: Reported on 05/03/2019 03/31/19   Love, Ivan Anchors, PA-C  senna-docusate (SENOKOT-S) 8.6-50 MG tablet Take 2 tablets by mouth at bedtime. Patient not taking: Reported on 05/03/2019 03/31/19   Love, Ivan Anchors, PA-C  temazepam (RESTORIL) 15 MG capsule Take 1 capsule (15 mg total) by mouth at bedtime as needed for sleep. Patient not taking: Reported on 05/03/2019 03/09/19   Regalado, Jerald Kief A, MD  fluticasone (FLONASE) 50 MCG/ACT nasal spray Place 2 sprays into the nose daily. Patient not taking: Reported on 03/10/2014 03/08/12 03/10/14  Robyn Haber, MD    Allergies    Azithromycin, Morphine and related, Nitrofurantoin, Penicillins, and Sulfonamide derivatives  Review of Systems   Review of Systems  Constitutional: Negative for chills and fever.  Eyes: Negative for visual disturbance.  Respiratory: Negative for cough and shortness of breath.   Cardiovascular: Negative for chest pain.  Gastrointestinal: Negative for abdominal pain, nausea and vomiting.  Neurological: Positive for weakness. Negative for numbness and headaches.  All other systems reviewed and are negative.   Physical Exam Updated Vital Signs BP 103/63    Pulse 79    Temp 98.1 F (36.7 C) (Oral)    Resp 20    Ht 5' 2"  (1.575 m)    Wt 76.2 kg    SpO2 96%    BMI 30.73 kg/m   Physical Exam Vitals and nursing note reviewed.  Constitutional:      Appearance: She is not ill-appearing or diaphoretic.     Comments: Visualized patient transferring from wheelchair to bed without assistance  HENT:     Head: Normocephalic and atraumatic.  Eyes:     Conjunctiva/sclera: Conjunctivae normal.  Cardiovascular:     Rate and Rhythm: Normal rate and regular rhythm.     Pulses: Normal pulses.  Pulmonary:     Effort: Pulmonary effort is normal.     Breath sounds: Normal breath sounds. No wheezing, rhonchi or rales.  Abdominal:     Palpations: Abdomen is soft.     Tenderness: There is no abdominal tenderness. There is no guarding or rebound.  Musculoskeletal:     Cervical back: Neck supple.  Skin:    General: Skin is warm and dry.  Neurological:     Mental Status: She is alert.     Comments: CN 3-12 grossly intact A&O x4 GCS  15 Sensation and strength intact. Residual LUE weakness, 4/5 strength. 5/5 remainder of extremities. Able to discern dull and sharp sensation throughout all extremities.  Coordination with finger-to-nose WNL Neg pronator drift     ED Results / Procedures / Treatments   Labs (all labs ordered are listed, but only abnormal results are displayed) Labs Reviewed  CBC - Abnormal; Notable for the following components:      Result Value   RBC 5.24 (*)    Hemoglobin 15.5 (*)    HCT 46.5 (*)    All other components within normal limits  COMPREHENSIVE METABOLIC PANEL - Abnormal; Notable for the following components:   Potassium 3.3 (*)    Glucose, Bld 144 (*)    All other components within normal limits  I-STAT CHEM 8, ED - Abnormal; Notable for the following components:   Potassium 3.2 (*)    Glucose, Bld 137 (*)    Hemoglobin 15.3 (*)    All other components within normal limits  CBG MONITORING, ED - Abnormal; Notable for the following components:   Glucose-Capillary 129 (*)    All other components within normal limits  PROTIME-INR  APTT  DIFFERENTIAL  I-STAT BETA HCG BLOOD, ED ([facility], [facility], [facility] ONLY)    EKG EKG Interpretation  Date/Time:  Sunday May 03 2019 20:41:21 EST Ventricular Rate:  74 PR Interval:    QRS Duration: 85 QT Interval:  391 QTC Calculation: 434 R Axis:   113 Text Interpretation: Sinus rhythm No STEMI Confirmed by Octaviano Glow [phone]) on 05/03/2019 8:54:58 PM   Radiology CT HEAD WO CONTRAST  Result Date: 05/03/2019 CLINICAL DATA:  Left arm heaviness and left foot numbness. EXAM: CT HEAD WITHOUT CONTRAST TECHNIQUE: Contiguous axial images were obtained from the base of the skull through the vertex without intravenous contrast. COMPARISON:  March 04, 2019 FINDINGS: Brain: No evidence of acute infarction, hemorrhage, hydrocephalus, extra-axial collection or mass lesion/mass effect. A chronic infarct involving the right lentiform nucleus is again seen.  Vascular: No hyperdense vessel or unexpected calcification. Skull: Normal. Negative for fracture or focal lesion. Sinuses/Orbits: No acute finding. Other: None. IMPRESSION: 1. Chronic infarct involving the right lentiform nucleus. 2. No acute intracranial abnormality. MRI correlation is recommended if clinical symptoms persist. Electronically Signed   By: Virgina Norfolk M.D.   On: 05/03/2019 19:55    Procedures Procedures (including critical care time)  Medications Ordered in ED Medications  sodium chloride flush (NS) 0.9 % injection 3 mL (3 mLs Intravenous Not Given 05/03/19 1942)  potassium chloride SA (KLOR-CON) CR tablet 40 mEq (has no administration in time range)  iohexol (OMNIPAQUE) 350 MG/ML injection 100 mL (100 mLs Intravenous Contrast Given 05/03/19 2025)    ED Course  I have reviewed the triage vital signs and the nursing notes.  Pertinent labs & imaging results that were available during my care of the patient were reviewed by me and considered in my medical decision making (see chart for details).  49 year old female presenting with complaints of new onset heaviness in left arm and left foot that began around 1 PM today. No other symptoms. Pt able to ambulate and transfer out of her wheelchair without difficulty. No focal neuro deficits on exam; pt does have residual LUE weakness from previous stroke 4/5 strength. Remainder of exam reassuring. Able to discern dull and sharp sensation throughout entire body without difficulty despite complaining of decreased sensation. She is out of the window for tPA and is VAN negative. CT Head was  ordered initially without acute findings. Will consult neurology.   Discussed case with Dr. Lorraine Lax who recommends CTA head and neck and MRI brain.   CTAs and MRI without acute infarcts. MRI impression not crossing over for whatever reason: The examination was prematurely terminated at the patient's request. Limited imaging acquisition as described. No  evidence of acute infarct. Redemonsrated chronic infarct within the right corona radiate/lentiform nucleus, also possibly involving portions of the right caudate body. Associated chronic blood products at this site.   Clinical Course as of May 02 2336  Nancy Fetter May 03, 2019  2152 CTA Head and Neck: IMPRESSION: 1. Patent right ICA stent. 2. Patent intracranial arteries.   [MV]    Clinical Course User Index [MV] Eustaquio Maize, PA-C   Pt cleared from a stroke standpoint. Dr. Lorraine Lax recommends outpatient follow up. Will replete potassium in the ED with 40 meq. Pt reports she has potassium at home that she is supposed to take. She will need to have her level rechekced in 1-2 weeks. She is advised to follow up with her neurologist. She is in agreement with plan and stable for discharge home.   This note was prepared using Dragon voice recognition software and may include unintentional dictation errors due to the inherent limitations of voice recognition software.  MDM Rules/Calculators/A&P                      Final Clinical Impression(s) / ED Diagnoses Final diagnoses:  Weakness  Hypokalemia    Rx / DC Orders ED Discharge Orders    None       Discharge Instructions     Your images were reassuring today - there were no signs of a stroke today.  Your potassium was mildly low. We have repleted in the ED. Please take your potassium that you have at home. You will need to have your potassium level rechecked in 1-2 weeks.  Please follow up with your neurologist regarding your ED visit today.        Eustaquio Maize, PA-C 05/03/19 2338    Wyvonnia Dusky, MD 05/04/19 [phone]

## 2019-05-04 ENCOUNTER — Telehealth: Payer: Self-pay | Admitting: Neurology

## 2019-05-04 NOTE — Telephone Encounter (Signed)
See emergency room visit from 05/03/2019. Sent to Dr Leonie Man for review.

## 2019-05-04 NOTE — Telephone Encounter (Signed)
Patient called after our service yesterday, 05/03/2019 and I spoke to her: She Reported that she had a stroke in November and started feeling heaviness in the same leg that was affected by the stroke, she felt different, she had intermittent numbness and tingling as well.  She was advised that sometimes stroke symptoms that have existed before seem to get worse temporarily but it would be difficult to discern over the phone whether or not this was a second stroke or strokelike event such as a TIA.  She was advised to seek immediate medical attention by going to the emergency room as it would be impossible for me to say for sure if she had another stroke or not.  She was agreeable to the plan and stated she would go to the hospital. I advised her that I would let her primary neurologist, Dr. Leonie Man know.

## 2019-05-05 DIAGNOSIS — I69354 Hemiplegia and hemiparesis following cerebral infarction affecting left non-dominant side: Secondary | ICD-10-CM | POA: Diagnosis not present

## 2019-05-06 ENCOUNTER — Other Ambulatory Visit (HOSPITAL_COMMUNITY): Payer: Self-pay | Admitting: Neurology

## 2019-05-06 DIAGNOSIS — E119 Type 2 diabetes mellitus without complications: Secondary | ICD-10-CM | POA: Diagnosis not present

## 2019-05-06 DIAGNOSIS — R911 Solitary pulmonary nodule: Secondary | ICD-10-CM | POA: Diagnosis not present

## 2019-05-06 DIAGNOSIS — M791 Myalgia, unspecified site: Secondary | ICD-10-CM | POA: Diagnosis not present

## 2019-05-06 DIAGNOSIS — G43909 Migraine, unspecified, not intractable, without status migrainosus: Secondary | ICD-10-CM | POA: Diagnosis not present

## 2019-05-06 DIAGNOSIS — M810 Age-related osteoporosis without current pathological fracture: Secondary | ICD-10-CM | POA: Diagnosis not present

## 2019-05-06 DIAGNOSIS — I69392 Facial weakness following cerebral infarction: Secondary | ICD-10-CM | POA: Diagnosis not present

## 2019-05-06 DIAGNOSIS — G4700 Insomnia, unspecified: Secondary | ICD-10-CM | POA: Diagnosis not present

## 2019-05-06 DIAGNOSIS — K259 Gastric ulcer, unspecified as acute or chronic, without hemorrhage or perforation: Secondary | ICD-10-CM | POA: Diagnosis not present

## 2019-05-06 DIAGNOSIS — I69318 Other symptoms and signs involving cognitive functions following cerebral infarction: Secondary | ICD-10-CM | POA: Diagnosis not present

## 2019-05-06 DIAGNOSIS — R471 Dysarthria and anarthria: Secondary | ICD-10-CM | POA: Diagnosis not present

## 2019-05-06 DIAGNOSIS — I69398 Other sequelae of cerebral infarction: Secondary | ICD-10-CM | POA: Diagnosis not present

## 2019-05-06 DIAGNOSIS — E785 Hyperlipidemia, unspecified: Secondary | ICD-10-CM | POA: Diagnosis not present

## 2019-05-06 DIAGNOSIS — G8929 Other chronic pain: Secondary | ICD-10-CM | POA: Diagnosis not present

## 2019-05-06 DIAGNOSIS — M8580 Other specified disorders of bone density and structure, unspecified site: Secondary | ICD-10-CM | POA: Diagnosis not present

## 2019-05-06 DIAGNOSIS — G9340 Encephalopathy, unspecified: Secondary | ICD-10-CM | POA: Diagnosis not present

## 2019-05-06 DIAGNOSIS — I10 Essential (primary) hypertension: Secondary | ICD-10-CM | POA: Diagnosis not present

## 2019-05-06 DIAGNOSIS — Z48812 Encounter for surgical aftercare following surgery on the circulatory system: Secondary | ICD-10-CM | POA: Diagnosis not present

## 2019-05-06 DIAGNOSIS — M21372 Foot drop, left foot: Secondary | ICD-10-CM | POA: Diagnosis not present

## 2019-05-06 DIAGNOSIS — M170 Bilateral primary osteoarthritis of knee: Secondary | ICD-10-CM | POA: Diagnosis not present

## 2019-05-06 DIAGNOSIS — J449 Chronic obstructive pulmonary disease, unspecified: Secondary | ICD-10-CM | POA: Diagnosis not present

## 2019-05-06 DIAGNOSIS — I69354 Hemiplegia and hemiparesis following cerebral infarction affecting left non-dominant side: Secondary | ICD-10-CM | POA: Diagnosis not present

## 2019-05-06 DIAGNOSIS — I959 Hypotension, unspecified: Secondary | ICD-10-CM | POA: Diagnosis not present

## 2019-05-06 NOTE — Telephone Encounter (Signed)
Thanks.  Reviewed ER notes.  MRI negative for acute stroke.  Patient had low potassium which was replaced.

## 2019-05-08 ENCOUNTER — Encounter: Payer: Self-pay | Admitting: Physical Medicine and Rehabilitation

## 2019-05-08 ENCOUNTER — Other Ambulatory Visit: Payer: Self-pay

## 2019-05-08 ENCOUNTER — Encounter
Payer: Medicare Other | Attending: Physical Medicine and Rehabilitation | Admitting: Physical Medicine and Rehabilitation

## 2019-05-08 VITALS — BP 109/78 | HR 83 | Ht 62.75 in | Wt 169.0 lb

## 2019-05-08 DIAGNOSIS — M791 Myalgia, unspecified site: Secondary | ICD-10-CM | POA: Diagnosis not present

## 2019-05-08 DIAGNOSIS — M810 Age-related osteoporosis without current pathological fracture: Secondary | ICD-10-CM | POA: Diagnosis not present

## 2019-05-08 DIAGNOSIS — R471 Dysarthria and anarthria: Secondary | ICD-10-CM | POA: Diagnosis not present

## 2019-05-08 DIAGNOSIS — M21372 Foot drop, left foot: Secondary | ICD-10-CM | POA: Diagnosis not present

## 2019-05-08 DIAGNOSIS — Z48812 Encounter for surgical aftercare following surgery on the circulatory system: Secondary | ICD-10-CM | POA: Diagnosis not present

## 2019-05-08 DIAGNOSIS — R911 Solitary pulmonary nodule: Secondary | ICD-10-CM | POA: Diagnosis not present

## 2019-05-08 DIAGNOSIS — G9340 Encephalopathy, unspecified: Secondary | ICD-10-CM | POA: Diagnosis not present

## 2019-05-08 DIAGNOSIS — I69354 Hemiplegia and hemiparesis following cerebral infarction affecting left non-dominant side: Secondary | ICD-10-CM | POA: Diagnosis not present

## 2019-05-08 DIAGNOSIS — J449 Chronic obstructive pulmonary disease, unspecified: Secondary | ICD-10-CM | POA: Diagnosis not present

## 2019-05-08 DIAGNOSIS — I69398 Other sequelae of cerebral infarction: Secondary | ICD-10-CM | POA: Diagnosis not present

## 2019-05-08 DIAGNOSIS — I959 Hypotension, unspecified: Secondary | ICD-10-CM | POA: Diagnosis not present

## 2019-05-08 DIAGNOSIS — G43909 Migraine, unspecified, not intractable, without status migrainosus: Secondary | ICD-10-CM | POA: Diagnosis not present

## 2019-05-08 DIAGNOSIS — G4701 Insomnia due to medical condition: Secondary | ICD-10-CM | POA: Diagnosis not present

## 2019-05-08 DIAGNOSIS — I63511 Cerebral infarction due to unspecified occlusion or stenosis of right middle cerebral artery: Secondary | ICD-10-CM | POA: Insufficient documentation

## 2019-05-08 DIAGNOSIS — I10 Essential (primary) hypertension: Secondary | ICD-10-CM | POA: Diagnosis not present

## 2019-05-08 DIAGNOSIS — M8580 Other specified disorders of bone density and structure, unspecified site: Secondary | ICD-10-CM | POA: Diagnosis not present

## 2019-05-08 DIAGNOSIS — I69392 Facial weakness following cerebral infarction: Secondary | ICD-10-CM | POA: Diagnosis not present

## 2019-05-08 DIAGNOSIS — E785 Hyperlipidemia, unspecified: Secondary | ICD-10-CM | POA: Diagnosis not present

## 2019-05-08 DIAGNOSIS — G4700 Insomnia, unspecified: Secondary | ICD-10-CM | POA: Diagnosis not present

## 2019-05-08 DIAGNOSIS — I69318 Other symptoms and signs involving cognitive functions following cerebral infarction: Secondary | ICD-10-CM | POA: Diagnosis not present

## 2019-05-08 DIAGNOSIS — E119 Type 2 diabetes mellitus without complications: Secondary | ICD-10-CM | POA: Diagnosis not present

## 2019-05-08 DIAGNOSIS — M170 Bilateral primary osteoarthritis of knee: Secondary | ICD-10-CM | POA: Diagnosis not present

## 2019-05-08 DIAGNOSIS — G8929 Other chronic pain: Secondary | ICD-10-CM | POA: Diagnosis not present

## 2019-05-08 DIAGNOSIS — K259 Gastric ulcer, unspecified as acute or chronic, without hemorrhage or perforation: Secondary | ICD-10-CM | POA: Diagnosis not present

## 2019-05-08 MED ORDER — TEMAZEPAM 15 MG PO CAPS: 15.0000 mg | ORAL_CAPSULE | Freq: Every evening | ORAL | 0 refills | Status: DC | PRN

## 2019-05-08 NOTE — Progress Notes (Signed)
Subjective:    Patient ID: Ann Lopez, female    DOB: Jun 10, 1970, 49 y.o.   MRN: [id number]  HPI  Ann Lopez is a 49 year old woman who presents for 2nd transitional care follow-up after CIR admission for ischemic right middle cerebral artery stroke.   She has been mobilizing well with PT and OT and has been taught a HEP. She has been able to ambulate with a cane now.   She has been sleeping better with the Temazepam and would like a refill.   Her goal is to reach independence so she does not depend on her son and daughter-in-law.   She is no longer having headaches and would like to stop the Topiramate as it is making her sleepy.     Pain Inventory Average Pain 5 Pain Right Now 3 My pain is aching  In the last 24 hours, has pain interfered with the following? General activity 4 Relation with others 4 Enjoyment of life 4 What TIME of day is your pain at its worst? night Sleep (in general) Fair  Pain is worse with: sitting and standing Pain improves with: medication Relief from Meds: 8  Mobility use a cane ability to climb steps?  yes do you drive?  no  Function disabled: date disabled .  Neuro/Psych depression anxiety  Prior Studies Any changes since last visit?  no  Physicians involved in your care Any changes since last visit?  no   Family History  Problem Relation Age of Onset  . Cancer Mother 36       breast cancer  . Hypertension Sister   . Hypothyroidism Sister   . Cancer Sister   . Hypothyroidism Brother   . Cancer Maternal Grandmother 5       breast cancer  . Cancer Maternal Grandfather 58       pancreatic cancer   Social History   Socioeconomic History  . Marital status: Single    Spouse name: Not on file  . Number of children: 4  . Years of education: 48  . Highest education level: Not on file  Occupational History  . Occupation: Disabled  Tobacco Use  . Smoking status: Current Some Day Smoker    Packs/day: 0.50   Types: Cigarettes  . Smokeless tobacco: Never Used  Substance and Sexual Activity  . Alcohol use: No    Alcohol/week: 0.0 standard drinks  . Drug use: No  . Sexual activity: Not Currently  Other Topics Concern  . Not on file  Social History Narrative   Marital status: single      Lives: at home with her four sons (12, 60, 73, 22); no grandchildren      Employment: disability for breast cancer, DDD lumbar, anxiety/panic attacks      Tobacco: 1 ppd       Alcohol: none         Right-handed.   2-3 cups caffeine daily.   Social Determinants of Health   Financial Resource Strain:   . Difficulty of Paying Living Expenses: Not on file  Food Insecurity:   . Worried About Charity fundraiser in the Last Year: Not on file  . Ran Out of Food in the Last Year: Not on file  Transportation Needs:   . Lack of Transportation (Medical): Not on file  . Lack of Transportation (Non-Medical): Not on file  Physical Activity:   . Days of Exercise per Week: Not on file  . Minutes of Exercise per Session:  Not on file  Stress:   . Feeling of Stress : Not on file  Social Connections:   . Frequency of Communication with Friends and Family: Not on file  . Frequency of Social Gatherings with Friends and Family: Not on file  . Attends Religious Services: Not on file  . Active Member of Clubs or Organizations: Not on file  . Attends Archivist Meetings: Not on file  . Marital Status: Not on file   Past Surgical History:  Procedure Laterality Date  . ABDOMINAL HYSTERECTOMY     B oophorectomy for BRCA1 gene  . BREAST SURGERY    . CESAREAN SECTION    . INCISION AND DRAINAGE ABSCESS Left 11/14/2012   Procedure: INCISION AND DRAINAGE ABSCESS LEFT GROIN;  Surgeon: Jamesetta So, MD;  Location: [facility];  Service: General;  Laterality: Left;  . IR ANGIO VERTEBRAL SEL SUBCLAVIAN INNOMINATE UNI R MOD SED  03/02/2019  . IR CT HEAD LTD  03/02/2019  . IR INTRAVSC STENT CERV CAROTID W/O EMB-PROT MOD SED  INC ANGIO  03/02/2019  . IR PERCUTANEOUS ART THROMBECTOMY/INFUSION INTRACRANIAL INC DIAG ANGIO  03/02/2019  . LYMPHADENECTOMY    . MASTECTOMY Bilateral   . RADIOLOGY WITH ANESTHESIA N/A 03/02/2019   Procedure: IR WITH ANESTHESIA;  Surgeon: Luanne Bras, MD;  Location: [facility];  Service: Radiology;  Laterality: N/A;   Past Medical History:  Diagnosis Date  . Allergy   . Anxiety   . Asthma   . Back pain   . Breast cancer ([place])   . Cancer ([place])   . Depression   . Diabetes mellitus   . Headache   . Hyperlipemia   . Osteopenia   . Osteoporosis    osteopenia   There were no vitals taken for this visit.  Opioid Risk Score:   Fall Risk Score:  `1  Depression screen PHQ 2/9  Depression screen [facility] 2/9 01/04/2016 12/06/2015 10/27/2015 09/29/2015 08/30/2015 07/28/2015 06/28/2015  Decreased Interest 0 0 0 0 0 0 0  Down, Depressed, Hopeless 0 0 1 0 0 0 0  PHQ - 2 Score 0 0 1 0 0 0 0  Altered sleeping - - - - - - -  Tired, decreased energy - - - - - - -  Change in appetite - - - - - - -  Feeling bad or failure about yourself  - - - - - - -  Trouble concentrating - - - - - - -  Moving slowly or fidgety/restless - - - - - - -  Suicidal thoughts - - - - - - -  PHQ-9 Score - - - - - - -  Difficult doing work/chores - - - - - - -     Review of Systems  Constitutional: Negative.   HENT: Negative.   Eyes: Negative.   Respiratory: Negative.   Cardiovascular: Negative.   Gastrointestinal: Negative.   Endocrine: Negative.   Genitourinary: Negative.   Musculoskeletal: Positive for arthralgias and gait problem.  Skin: Negative.   Allergic/Immunologic: Negative.   Hematological: Negative.   Psychiatric/Behavioral: Positive for dysphoric mood. The patient is nervous/anxious.   All other systems reviewed and are negative.      Objective:   Physical Exam Constitutional: No distress . Vital signs reviewed. HENT: Normocephalic. Atraumatic. Eyes: EOMI. No discharge. Cardiovascular:  No JVD. Respiratory: Normal effort. No stridor. GI: Non-distended. Skin: Warm and dry. Intact. Psych: Normal mood. Normal behavior. Musc: No edema in extremities. No tenderness  in extremities. Neuro: Alert Motor: RUE/RLE: 5/5 proximal distal LUE: 0/5 proximal distal, unchanged LLE: Hip flexion, knee extension 4/5, ankle dorsiflexion 3/5    Assessment & Plan:  1. Functional deficits secondary to Right MCA infarct --Continue home OT and PT with eventual transition to outpatient therapies. Patient stated goal is to ambulate without cane and be less dependent on her children.  2) Depression --much improved. Patient loves her psychiatrist and is happy to be home. Continue Cymblata which seems to be helping with depression.  3)Headaches -Resolved. Off Topamax.   4) Insomnia: --Will renew Temazepam which allowed her to sleep well in the hospital.   20 minutes of face to face patient care time were spent during this visit. All questions were encouraged and answered. Follow up with me PRN

## 2019-05-12 DIAGNOSIS — E785 Hyperlipidemia, unspecified: Secondary | ICD-10-CM | POA: Diagnosis not present

## 2019-05-12 DIAGNOSIS — G4700 Insomnia, unspecified: Secondary | ICD-10-CM | POA: Diagnosis not present

## 2019-05-12 DIAGNOSIS — I10 Essential (primary) hypertension: Secondary | ICD-10-CM | POA: Diagnosis not present

## 2019-05-12 DIAGNOSIS — G43909 Migraine, unspecified, not intractable, without status migrainosus: Secondary | ICD-10-CM | POA: Diagnosis not present

## 2019-05-12 DIAGNOSIS — J449 Chronic obstructive pulmonary disease, unspecified: Secondary | ICD-10-CM | POA: Diagnosis not present

## 2019-05-12 DIAGNOSIS — M8580 Other specified disorders of bone density and structure, unspecified site: Secondary | ICD-10-CM | POA: Diagnosis not present

## 2019-05-12 DIAGNOSIS — M791 Myalgia, unspecified site: Secondary | ICD-10-CM | POA: Diagnosis not present

## 2019-05-12 DIAGNOSIS — K259 Gastric ulcer, unspecified as acute or chronic, without hemorrhage or perforation: Secondary | ICD-10-CM | POA: Diagnosis not present

## 2019-05-12 DIAGNOSIS — E119 Type 2 diabetes mellitus without complications: Secondary | ICD-10-CM | POA: Diagnosis not present

## 2019-05-12 DIAGNOSIS — Z48812 Encounter for surgical aftercare following surgery on the circulatory system: Secondary | ICD-10-CM | POA: Diagnosis not present

## 2019-05-12 DIAGNOSIS — I69318 Other symptoms and signs involving cognitive functions following cerebral infarction: Secondary | ICD-10-CM | POA: Diagnosis not present

## 2019-05-12 DIAGNOSIS — G9340 Encephalopathy, unspecified: Secondary | ICD-10-CM | POA: Diagnosis not present

## 2019-05-12 DIAGNOSIS — G8929 Other chronic pain: Secondary | ICD-10-CM | POA: Diagnosis not present

## 2019-05-12 DIAGNOSIS — R911 Solitary pulmonary nodule: Secondary | ICD-10-CM | POA: Diagnosis not present

## 2019-05-12 DIAGNOSIS — M21372 Foot drop, left foot: Secondary | ICD-10-CM | POA: Diagnosis not present

## 2019-05-12 DIAGNOSIS — I959 Hypotension, unspecified: Secondary | ICD-10-CM | POA: Diagnosis not present

## 2019-05-12 DIAGNOSIS — R471 Dysarthria and anarthria: Secondary | ICD-10-CM | POA: Diagnosis not present

## 2019-05-12 DIAGNOSIS — M170 Bilateral primary osteoarthritis of knee: Secondary | ICD-10-CM | POA: Diagnosis not present

## 2019-05-12 DIAGNOSIS — I69398 Other sequelae of cerebral infarction: Secondary | ICD-10-CM | POA: Diagnosis not present

## 2019-05-12 DIAGNOSIS — M810 Age-related osteoporosis without current pathological fracture: Secondary | ICD-10-CM | POA: Diagnosis not present

## 2019-05-12 DIAGNOSIS — I69354 Hemiplegia and hemiparesis following cerebral infarction affecting left non-dominant side: Secondary | ICD-10-CM | POA: Diagnosis not present

## 2019-05-12 DIAGNOSIS — I69392 Facial weakness following cerebral infarction: Secondary | ICD-10-CM | POA: Diagnosis not present

## 2019-05-14 ENCOUNTER — Encounter: Payer: Self-pay | Admitting: *Deleted

## 2019-05-14 DIAGNOSIS — G9340 Encephalopathy, unspecified: Secondary | ICD-10-CM | POA: Diagnosis not present

## 2019-05-14 DIAGNOSIS — M170 Bilateral primary osteoarthritis of knee: Secondary | ICD-10-CM | POA: Diagnosis not present

## 2019-05-14 DIAGNOSIS — G43909 Migraine, unspecified, not intractable, without status migrainosus: Secondary | ICD-10-CM | POA: Diagnosis not present

## 2019-05-14 DIAGNOSIS — I69392 Facial weakness following cerebral infarction: Secondary | ICD-10-CM | POA: Diagnosis not present

## 2019-05-14 DIAGNOSIS — E119 Type 2 diabetes mellitus without complications: Secondary | ICD-10-CM | POA: Diagnosis not present

## 2019-05-14 DIAGNOSIS — J449 Chronic obstructive pulmonary disease, unspecified: Secondary | ICD-10-CM | POA: Diagnosis not present

## 2019-05-14 DIAGNOSIS — M810 Age-related osteoporosis without current pathological fracture: Secondary | ICD-10-CM | POA: Diagnosis not present

## 2019-05-14 DIAGNOSIS — K259 Gastric ulcer, unspecified as acute or chronic, without hemorrhage or perforation: Secondary | ICD-10-CM | POA: Diagnosis not present

## 2019-05-14 DIAGNOSIS — I69318 Other symptoms and signs involving cognitive functions following cerebral infarction: Secondary | ICD-10-CM | POA: Diagnosis not present

## 2019-05-14 DIAGNOSIS — R471 Dysarthria and anarthria: Secondary | ICD-10-CM | POA: Diagnosis not present

## 2019-05-14 DIAGNOSIS — Z48812 Encounter for surgical aftercare following surgery on the circulatory system: Secondary | ICD-10-CM | POA: Diagnosis not present

## 2019-05-14 DIAGNOSIS — M791 Myalgia, unspecified site: Secondary | ICD-10-CM | POA: Diagnosis not present

## 2019-05-14 DIAGNOSIS — G8929 Other chronic pain: Secondary | ICD-10-CM | POA: Diagnosis not present

## 2019-05-14 DIAGNOSIS — G4700 Insomnia, unspecified: Secondary | ICD-10-CM | POA: Diagnosis not present

## 2019-05-14 DIAGNOSIS — I69398 Other sequelae of cerebral infarction: Secondary | ICD-10-CM | POA: Diagnosis not present

## 2019-05-14 DIAGNOSIS — E785 Hyperlipidemia, unspecified: Secondary | ICD-10-CM | POA: Diagnosis not present

## 2019-05-14 DIAGNOSIS — M8580 Other specified disorders of bone density and structure, unspecified site: Secondary | ICD-10-CM | POA: Diagnosis not present

## 2019-05-14 DIAGNOSIS — M21372 Foot drop, left foot: Secondary | ICD-10-CM | POA: Diagnosis not present

## 2019-05-14 DIAGNOSIS — I10 Essential (primary) hypertension: Secondary | ICD-10-CM | POA: Diagnosis not present

## 2019-05-14 DIAGNOSIS — I69354 Hemiplegia and hemiparesis following cerebral infarction affecting left non-dominant side: Secondary | ICD-10-CM | POA: Diagnosis not present

## 2019-05-14 DIAGNOSIS — R911 Solitary pulmonary nodule: Secondary | ICD-10-CM | POA: Diagnosis not present

## 2019-05-14 DIAGNOSIS — I959 Hypotension, unspecified: Secondary | ICD-10-CM | POA: Diagnosis not present

## 2019-05-14 NOTE — Progress Notes (Signed)
Patient ID: Ann Lopez, female   DOB: 07-Jan-1971, 49 y.o.   MRN: [id number] 04/22/19 Notification from [facility].  Patient has monitor. The [employer] monitoring center has not been able to hook up device.  [employer] has attempted to contact the patient with no success.  All numbers were tried and have been sent text messages.

## 2019-05-15 ENCOUNTER — Telehealth: Payer: Self-pay

## 2019-05-15 NOTE — Telephone Encounter (Signed)
Ann Lopez, OT from [facility] called requesting verbal order of extended 1 visit.

## 2019-05-15 NOTE — Telephone Encounter (Signed)
Orders approved and given.

## 2019-05-18 ENCOUNTER — Telehealth: Payer: Self-pay

## 2019-05-18 ENCOUNTER — Ambulatory Visit (INDEPENDENT_AMBULATORY_CARE_PROVIDER_SITE_OTHER): Payer: Medicare Other | Admitting: Neurology

## 2019-05-18 ENCOUNTER — Encounter: Payer: Self-pay | Admitting: Neurology

## 2019-05-18 ENCOUNTER — Other Ambulatory Visit: Payer: Self-pay

## 2019-05-18 VITALS — BP 106/71 | HR 80 | Temp 97.7°F | Ht 62.0 in | Wt 178.8 lb

## 2019-05-18 DIAGNOSIS — G8110 Spastic hemiplegia affecting unspecified side: Secondary | ICD-10-CM

## 2019-05-18 DIAGNOSIS — I6521 Occlusion and stenosis of right carotid artery: Secondary | ICD-10-CM | POA: Diagnosis not present

## 2019-05-18 NOTE — Telephone Encounter (Signed)
DR.SEthi gave patient a permanent handicap placard for pt. He stated pt has a permanent disability from the stroke. Form was completed and done and given to pt after visit.

## 2019-05-18 NOTE — Progress Notes (Signed)
[facility] [address] [place]. [place] [address] [phone]       OFFICE FOLLOW-UP NOTE  [name] Date of Birth:  Jan 03, 1971 Medical Record Number:  [id number]   HPI: [name] is a 49 year old Caucasian lady seen for initial office follow-up visit following hospital admission for stroke in November 2020.  History is obtained from the patient, review of electronic medical records and I personally reviewed imaging films in PACS.  She has past medical history of diabetes, hyperlipidemia, smoking, breast cancer who presented on 03/02/2019 with sudden onset of left-sided weakness with neglect.  Last seen normal was unclear possibly the night prior and she was not a TPA candidate.  On exam she had left sided weakness with right gaze preference and was taken for emergent CT scan which was unremarkable but CT perfusion showed a relatively large right-sided penumbra with right M1 occlusion.  She was taken for emergent thrombectomy and was found to have proximal right carotid stenosis which required rescue ICA stenting and there was successful revascularization of the terminal right ICA, right ACA and MCA with TICI 2C reperfusion by Dr. Estanislado Pandy.  She was kept in the intensive care unit her blood pressure is tightly controlled and she was extubated and did well.  She had left hemiparesis but gradually improved.  She was placed on aspirin and Brilinta and transferred to inpatient rehab where she stayed for a few weeks and is currently at home.  She still getting therapy at home and is about to finish occupational therapy.  She has graduated from walking with a walker and now walks with a cane which she uses mostly for outdoors and indoors she can walk without it.  She is have pain with improvement in the left leg but her left arm is still significantly weak and she has trouble with bending her fingers and fine motor skills.  She is tolerating aspirin and Brilinta with  minor bruising but no major bleeding.  She has followed up with Dr. Estanislado Pandy who plans to do a follow-up carotid ultrasound in May at next visit.  She has quit smoking completely and is proud of this fact.  She states her sugars are doing much better and fasting sugars are usually in the 1 10-1 20 range last A1c was 6.  Her lipids are also improving and she is on Crestor and her primary care physician has recently prescribed Repatha injections which she has not yet started.  She has been evaluated for sleep apnea in the past and does not have it.  She wants to start driving and is requesting a handicap parking sticker.  ROS:   14 system review of systems is positive for weakness, gait difficulty, bruising and all other systems negative PMH:  Past Medical History:  Diagnosis Date  . Allergy   . Anxiety   . Asthma   . Back pain   . Breast cancer ([place])   . Cancer ([place])   . Depression   . Diabetes mellitus   . Headache   . Hyperlipemia   . Osteopenia   . Osteoporosis    osteopenia  . Stroke [facility])     Social History:  Social History   Socioeconomic History  . Marital status: Single    Spouse name: Not on file  . Number of children: 4  . Years of education: 35  . Highest education level: Not on file  Occupational History  . Occupation: Disabled  Tobacco Use  .  Smoking status: Former Smoker    Packs/day: 0.50    Types: Cigarettes    Quit date: 03/02/2019    Years since quitting: 0.2  . Smokeless tobacco: Never Used  Substance and Sexual Activity  . Alcohol use: No    Alcohol/week: 0.0 standard drinks  . Drug use: No  . Sexual activity: Not Currently  Other Topics Concern  . Not on file  Social History Narrative   Marital status: single      Lives: at home with her four sons (12, 61, 47, 57); no grandchildren      Employment: disability for breast cancer, DDD lumbar, anxiety/panic attacks      Tobacco: 1 ppd       Alcohol: none         Right-handed.   2-3 cups  caffeine daily.   Social Determinants of Health   Financial Resource Strain:   . Difficulty of Paying Living Expenses: Not on file  Food Insecurity:   . Worried About Charity fundraiser in the Last Year: Not on file  . Ran Out of Food in the Last Year: Not on file  Transportation Needs:   . Lack of Transportation (Medical): Not on file  . Lack of Transportation (Non-Medical): Not on file  Physical Activity:   . Days of Exercise per Week: Not on file  . Minutes of Exercise per Session: Not on file  Stress:   . Feeling of Stress : Not on file  Social Connections:   . Frequency of Communication with Friends and Family: Not on file  . Frequency of Social Gatherings with Friends and Family: Not on file  . Attends Religious Services: Not on file  . Active Member of Clubs or Organizations: Not on file  . Attends Archivist Meetings: Not on file  . Marital Status: Not on file  Intimate Partner Violence:   . Fear of Current or Ex-Partner: Not on file  . Emotionally Abused: Not on file  . Physically Abused: Not on file  . Sexually Abused: Not on file    Medications:   Current Outpatient Medications on File Prior to Visit  Medication Sig Dispense Refill  . acetaminophen (TYLENOL) 325 MG tablet Take 1-2 tablets (325-650 mg total) by mouth every 4 (four) hours as needed for mild pain.    Marland Kitchen albuterol (PROVENTIL HFA;VENTOLIN HFA) 108 (90 Base) MCG/ACT inhaler Inhale 1 puff into the lungs every 6 (six) hours as needed for wheezing or shortness of breath. 1 Inhaler 6  . aspirin EC 81 MG tablet Take 1 tablet (81 mg total) by mouth daily. 30 tablet 0  . canagliflozin (INVOKANA) 300 MG TABS tablet Take 1 tablet (300 mg total) by mouth daily before breakfast. 30 tablet 0  . clonazePAM (KLONOPIN) 0.5 MG tablet Take 0.5 tablets (0.25 mg total) by mouth 2 (two) times daily as needed for anxiety. 30 tablet 0  . docusate sodium (COLACE) 100 MG capsule Take 200 mg by mouth at bedtime.    .  DULoxetine (CYMBALTA) 60 MG capsule TAKE 1 CAPSULE BY MOUTH EVERY DAY (Patient taking differently: Take 60 mg by mouth daily. ) 90 capsule 1  . glucose blood (CHOICE DM FORA G20 TEST STRIPS) test strip Use as instructed 100 each 12  . HYDROcodone-acetaminophen (NORCO) 10-325 MG tablet Take 1 tablet by mouth every 6 (six) hours as needed for moderate pain.     Marland Kitchen icosapent Ethyl (VASCEPA) 1 g capsule Take 2 capsules (2 g  total) by mouth 2 (two) times daily. 180 capsule 3  . Insulin Glargine (LANTUS) 100 UNIT/ML Solostar Pen Inject 15 Units into the skin daily. (Patient taking differently: Inject 15 Units into the skin at bedtime. ) 15 mL 11  . Insulin Pen Needle (PEN NEEDLES) 32G X 5 MM MISC 1 application by Does not apply route at bedtime. 100 each 0  . Lancets (ONETOUCH ULTRASOFT) lancets Use as instructed; check sugar before meals and at bedtime 100 each 12  . Levocetirizine Dihydrochloride (XYZAL PO) Take by mouth at bedtime.    . mometasone (NASONEX) 50 MCG/ACT nasal spray Place 2 sprays into the nose daily. 17 g 12  . montelukast (SINGULAIR) 10 MG tablet Take 10 mg by mouth at bedtime.    . pantoprazole (PROTONIX) 40 MG tablet Take 1 tablet (40 mg total) by mouth daily. 30 tablet 0  . potassium chloride (KLOR-CON) 10 MEQ tablet Take 1 tablet (10 mEq total) by mouth 2 (two) times daily. (Patient taking differently: Take 10 mEq by mouth daily. ) 60 tablet 0  . REPATHA SURECLICK XX123456 MG/ML SOAJ Inject 1 Syringe into the skin every 14 (fourteen) days.    . rosuvastatin (CRESTOR) 40 MG tablet Take 1 tablet (40 mg total) by mouth daily. 90 tablet 3  . temazepam (RESTORIL) 15 MG capsule Take 1 capsule (15 mg total) by mouth at bedtime as needed for sleep. 60 capsule 0  . ticagrelor (BRILINTA) 90 MG TABS tablet Take 1 tablet (90 mg total) by mouth 2 (two) times daily. 60 tablet 2  . [DISCONTINUED] fluticasone (FLONASE) 50 MCG/ACT nasal spray Place 2 sprays into the nose daily. (Patient not taking:  Reported on 03/10/2014) 1 g 12   No current facility-administered medications on file prior to visit.    Allergies:   Allergies  Allergen Reactions  . Azithromycin Other (See Comments)    Unknown reaction  . Morphine And Related Other (See Comments)    Unknown reaction - Can tolerate hydrocodone and dilaudid without side effects  . Nitrofurantoin Other (See Comments)    Unknown reaction  . Penicillins Hives and Itching    Has patient had a PCN reaction causing immediate rash, facial/tongue/throat swelling, SOB or lightheadedness with hypotension: Yes Has patient had a PCN reaction causing severe rash involving mucus membranes or skin necrosis: No Has patient had a PCN reaction that required hospitalization No Has patient had a PCN reaction occurring within the last 10 years: No If all of the above answers are "NO", then may proceed with Cephalosporin use.   . Sulfonamide Derivatives Hives    Physical Exam General: well developed, well nourished middle-aged Caucasian lady, seated, in no evident distress Head: head normocephalic and atraumatic.  Neck: supple with no carotid or supraclavicular bruits Cardiovascular: regular rate and rhythm, no murmurs Musculoskeletal: no deformity Skin:  no rash/petichiae Vascular:  Normal pulses all extremities Vitals:   05/18/19 0851  BP: 106/71  Pulse: 80  Temp: 97.7 F (36.5 C)   Neurologic Exam Mental Status: Awake and fully alert. Oriented to place and time. Recent and remote memory intact. Attention span, concentration and fund of knowledge appropriate. Mood and affect appropriate.  Cranial Nerves: Fundoscopic exam reveals sharp disc margins. Pupils equal, briskly reactive to light. Extraocular movements full without nystagmus. Visual fields full to confrontation. Hearing intact. Facial sensation intact. Face, tongue, palate moves normally and symmetrically.  Motor: Normal strength on the right.  Spastic left hemiparesis with 3/5 left  upper extremity strength  with weakness of grip and distal hand muscles.  4/5 left lower extremity strength with mild weakness of ankle dorsiflexors and hip flexors.  Tone is increased on the left compared to the right. Sensory.: intact to touch ,pinprick .position and vibratory sensation.  Coordination: Rapid alternating movements normal in all extremities. Finger-to-nose and heel-to-shin performed accurately bilaterally. Gait and Station: Arises from chair without difficulty. Stance is normal.  She walks with a spastic hemiplegic gait with circumduction and slight left foot drop and uses a 4 pronged cane.  Reflexes: 1+ and symmetric. Toes downgoing.   NIHSS  3 Modified Rankin 3   ASSESSMENT: 49 year old lady with right MCA infarct in November 2020 secondary to terminal right ICA, MCA and ACA occlusion status post mechanical thrombectomy and rescue right proximal ICA stent placement who is doing moderately well but does still have residual mild spastic left hemiparesis.  Vascular risk factors of diabetes, hyper lipidemia, smoking and carotid stenosis     PLAN: I had a long d/w patient about her recent stroke,carotid stenosis and stenting, risk for recurrent stroke/TIAs, personally independently reviewed imaging studies and stroke evaluation results and answered questions.Continue aspirin 81 mg daily and Brilinta (ticagrelor) 90 mg bid  for secondary stroke prevention and maintain strict control of hypertension with blood pressure goal below 130/90, diabetes with hemoglobin A1c goal below 6.5% and lipids with LDL cholesterol goal below 70 mg/dL. I also advised the patient to eat a healthy diet with plenty of whole grains, cereals, fruits and vegetables, exercise regularly and maintain ideal body weight .  I complemented the patient on having quit smoking and encouraged her to continue to do so.  She will keep scheduled appointment with Dr. Estanislado Pandy in May.  Patient may also consider referral to [facility]  neurology for consideration for participation in the [facility] study of transcranial magnetic stimulation to improve upper extremity strength following stroke followup in the future with my nurse practitioner Janett Billow in 6 months or call earlier if necessary. Greater than 50% of time during this 30 minute visit was spent on counseling,explanation of diagnosis, planning of further management, discussion with patient and family and coordination of care Antony Contras, MD  [facility] [address] [place], [place] [address]  Phone [phone] Fax [phone] Note: This document was prepared with digital dictation and possible smart phrase technology. Any transcriptional errors that result from this process are unintentional

## 2019-05-18 NOTE — Patient Instructions (Signed)
I had a long d/w patient about her recent stroke,carotid stenosis and stenting, risk for recurrent stroke/TIAs, personally independently reviewed imaging studies and stroke evaluation results and answered questions.Continue aspirin 81 mg daily and Brilinta (ticagrelor) 90 mg bid  for secondary stroke prevention and maintain strict control of hypertension with blood pressure goal below 130/90, diabetes with hemoglobin A1c goal below 6.5% and lipids with LDL cholesterol goal below 70 mg/dL. I also advised the patient to eat a healthy diet with plenty of whole grains, cereals, fruits and vegetables, exercise regularly and maintain ideal body weight .  I complemented the patient on having quit smoking and encouraged her to continue to do so.  She will keep scheduled appointment with Dr. Estanislado Pandy in May.  Patient may also consider referral to [facility] neurology for consideration for participation in the [facility] study of transcranial magnetic stimulation to improve upper extremity strength following stroke followup in the future with my nurse practitioner Janett Billow in 6 months or call earlier if necessary.  Stroke Prevention Some medical conditions and behaviors are associated with a higher chance of having a stroke. You can help prevent a stroke by making nutrition, lifestyle, and other changes, including managing any medical conditions you may have. What nutrition changes can be made?   Eat healthy foods. You can do this by: ? Choosing foods high in fiber, such as fresh fruits and vegetables and whole grains. ? Eating at least 5 or more servings of fruits and vegetables a day. Try to fill half of your plate at each meal with fruits and vegetables. ? Choosing lean protein foods, such as lean cuts of meat, poultry without skin, fish, tofu, beans, and nuts. ? Eating low-fat dairy products. ? Avoiding foods that are high in salt (sodium). This can help lower blood pressure. ? Avoiding foods that have saturated  fat, trans fat, and cholesterol. This can help prevent high cholesterol. ? Avoiding processed and premade foods.  Follow your health care provider's specific guidelines for losing weight, controlling high blood pressure (hypertension), lowering high cholesterol, and managing diabetes. These may include: ? Reducing your daily calorie intake. ? Limiting your daily sodium intake to 1,500 milligrams (mg). ? Using only healthy fats for cooking, such as olive oil, canola oil, or sunflower oil. ? Counting your daily carbohydrate intake. What lifestyle changes can be made?  Maintain a healthy weight. Talk to your health care provider about your ideal weight.  Get at least 30 minutes of moderate physical activity at least 5 days a week. Moderate activity includes brisk walking, biking, and swimming.  Do not use any products that contain nicotine or tobacco, such as cigarettes and e-cigarettes. If you need help quitting, ask your health care provider. It may also be helpful to avoid exposure to secondhand smoke.  Limit alcohol intake to no more than 1 drink a day for nonpregnant women and 2 drinks a day for men. One drink equals 12 oz of beer, 5 oz of wine, or 1 oz of hard liquor.  Stop any illegal drug use.  Avoid taking birth control pills. Talk to your health care provider about the risks of taking birth control pills if: ? You are over 75 years old. ? You smoke. ? You get migraines. ? You have ever had a blood clot. What other changes can be made?  Manage your cholesterol levels. ? Eating a healthy diet is important for preventing high cholesterol. If cholesterol cannot be managed through diet alone, you may also need  to take medicines. ? Take any prescribed medicines to control your cholesterol as told by your health care provider.  Manage your diabetes. ? Eating a healthy diet and exercising regularly are important parts of managing your blood sugar. If your blood sugar cannot be managed  through diet and exercise, you may need to take medicines. ? Take any prescribed medicines to control your diabetes as told by your health care provider.  Control your hypertension. ? To reduce your risk of stroke, try to keep your blood pressure below 130/80. ? Eating a healthy diet and exercising regularly are an important part of controlling your blood pressure. If your blood pressure cannot be managed through diet and exercise, you may need to take medicines. ? Take any prescribed medicines to control hypertension as told by your health care provider. ? Ask your health care provider if you should monitor your blood pressure at home. ? Have your blood pressure checked every year, even if your blood pressure is normal. Blood pressure increases with age and some medical conditions.  Get evaluated for sleep disorders (sleep apnea). Talk to your health care provider about getting a sleep evaluation if you snore a lot or have excessive sleepiness.  Take over-the-counter and prescription medicines only as told by your health care provider. Aspirin or blood thinners (antiplatelets or anticoagulants) may be recommended to reduce your risk of forming blood clots that can lead to stroke.  Make sure that any other medical conditions you have, such as atrial fibrillation or atherosclerosis, are managed. What are the warning signs of a stroke? The warning signs of a stroke can be easily remembered as BEFAST.  B is for balance. Signs include: ? Dizziness. ? Loss of balance or coordination. ? Sudden trouble walking.  E is for eyes. Signs include: ? A sudden change in vision. ? Trouble seeing.  F is for face. Signs include: ? Sudden weakness or numbness of the face. ? The face or eyelid drooping to one side.  A is for arms. Signs include: ? Sudden weakness or numbness of the arm, usually on one side of the body.  S is for speech. Signs include: ? Trouble speaking (aphasia). ? Trouble  understanding.  T is for time. ? These symptoms may represent a serious problem that is an emergency. Do not wait to see if the symptoms will go away. Get medical help right away. Call your local emergency services (911 in the [place] Do not drive yourself to the hospital.  Other signs of stroke may include: ? A sudden, severe headache with no known cause. ? Nausea or vomiting. ? Seizure. Where to find more information For more information, visit:  American Stroke Association: www.strokeassociation.org  National Stroke Association: www.stroke.org Summary  You can prevent a stroke by eating healthy, exercising, not smoking, limiting alcohol intake, and managing any medical conditions you may have.  Do not use any products that contain nicotine or tobacco, such as cigarettes and e-cigarettes. If you need help quitting, ask your health care provider. It may also be helpful to avoid exposure to secondhand smoke.  Remember BEFAST for warning signs of stroke. Get help right away if you or a loved one has any of these signs. This information is not intended to replace advice given to you by your health care provider. Make sure you discuss any questions you have with your health care provider. Document Revised: 03/08/2017 Document Reviewed: 05/01/2016 Elsevier Patient Education  2020 [employer].

## 2019-05-19 DIAGNOSIS — R471 Dysarthria and anarthria: Secondary | ICD-10-CM | POA: Diagnosis not present

## 2019-05-19 DIAGNOSIS — M791 Myalgia, unspecified site: Secondary | ICD-10-CM | POA: Diagnosis not present

## 2019-05-19 DIAGNOSIS — M8580 Other specified disorders of bone density and structure, unspecified site: Secondary | ICD-10-CM | POA: Diagnosis not present

## 2019-05-19 DIAGNOSIS — I69318 Other symptoms and signs involving cognitive functions following cerebral infarction: Secondary | ICD-10-CM | POA: Diagnosis not present

## 2019-05-19 DIAGNOSIS — M21372 Foot drop, left foot: Secondary | ICD-10-CM | POA: Diagnosis not present

## 2019-05-19 DIAGNOSIS — I69392 Facial weakness following cerebral infarction: Secondary | ICD-10-CM | POA: Diagnosis not present

## 2019-05-19 DIAGNOSIS — G43909 Migraine, unspecified, not intractable, without status migrainosus: Secondary | ICD-10-CM | POA: Diagnosis not present

## 2019-05-19 DIAGNOSIS — K259 Gastric ulcer, unspecified as acute or chronic, without hemorrhage or perforation: Secondary | ICD-10-CM | POA: Diagnosis not present

## 2019-05-19 DIAGNOSIS — E785 Hyperlipidemia, unspecified: Secondary | ICD-10-CM | POA: Diagnosis not present

## 2019-05-19 DIAGNOSIS — I959 Hypotension, unspecified: Secondary | ICD-10-CM | POA: Diagnosis not present

## 2019-05-19 DIAGNOSIS — J449 Chronic obstructive pulmonary disease, unspecified: Secondary | ICD-10-CM | POA: Diagnosis not present

## 2019-05-19 DIAGNOSIS — I10 Essential (primary) hypertension: Secondary | ICD-10-CM | POA: Diagnosis not present

## 2019-05-19 DIAGNOSIS — G9340 Encephalopathy, unspecified: Secondary | ICD-10-CM | POA: Diagnosis not present

## 2019-05-19 DIAGNOSIS — M810 Age-related osteoporosis without current pathological fracture: Secondary | ICD-10-CM | POA: Diagnosis not present

## 2019-05-19 DIAGNOSIS — M170 Bilateral primary osteoarthritis of knee: Secondary | ICD-10-CM | POA: Diagnosis not present

## 2019-05-19 DIAGNOSIS — I69398 Other sequelae of cerebral infarction: Secondary | ICD-10-CM | POA: Diagnosis not present

## 2019-05-19 DIAGNOSIS — E119 Type 2 diabetes mellitus without complications: Secondary | ICD-10-CM | POA: Diagnosis not present

## 2019-05-19 DIAGNOSIS — G8929 Other chronic pain: Secondary | ICD-10-CM | POA: Diagnosis not present

## 2019-05-19 DIAGNOSIS — G4700 Insomnia, unspecified: Secondary | ICD-10-CM | POA: Diagnosis not present

## 2019-05-19 DIAGNOSIS — Z48812 Encounter for surgical aftercare following surgery on the circulatory system: Secondary | ICD-10-CM | POA: Diagnosis not present

## 2019-05-19 DIAGNOSIS — I69354 Hemiplegia and hemiparesis following cerebral infarction affecting left non-dominant side: Secondary | ICD-10-CM | POA: Diagnosis not present

## 2019-05-19 DIAGNOSIS — R911 Solitary pulmonary nodule: Secondary | ICD-10-CM | POA: Diagnosis not present

## 2019-05-20 ENCOUNTER — Other Ambulatory Visit: Payer: Self-pay

## 2019-05-20 DIAGNOSIS — M21372 Foot drop, left foot: Secondary | ICD-10-CM | POA: Diagnosis not present

## 2019-05-20 DIAGNOSIS — G43909 Migraine, unspecified, not intractable, without status migrainosus: Secondary | ICD-10-CM | POA: Diagnosis not present

## 2019-05-20 DIAGNOSIS — M791 Myalgia, unspecified site: Secondary | ICD-10-CM | POA: Diagnosis not present

## 2019-05-20 DIAGNOSIS — G9340 Encephalopathy, unspecified: Secondary | ICD-10-CM | POA: Diagnosis not present

## 2019-05-20 DIAGNOSIS — K259 Gastric ulcer, unspecified as acute or chronic, without hemorrhage or perforation: Secondary | ICD-10-CM | POA: Diagnosis not present

## 2019-05-20 DIAGNOSIS — E119 Type 2 diabetes mellitus without complications: Secondary | ICD-10-CM | POA: Diagnosis not present

## 2019-05-20 DIAGNOSIS — G8929 Other chronic pain: Secondary | ICD-10-CM | POA: Diagnosis not present

## 2019-05-20 DIAGNOSIS — M170 Bilateral primary osteoarthritis of knee: Secondary | ICD-10-CM | POA: Diagnosis not present

## 2019-05-20 DIAGNOSIS — R471 Dysarthria and anarthria: Secondary | ICD-10-CM | POA: Diagnosis not present

## 2019-05-20 DIAGNOSIS — E785 Hyperlipidemia, unspecified: Secondary | ICD-10-CM | POA: Diagnosis not present

## 2019-05-20 DIAGNOSIS — I69392 Facial weakness following cerebral infarction: Secondary | ICD-10-CM | POA: Diagnosis not present

## 2019-05-20 DIAGNOSIS — G4700 Insomnia, unspecified: Secondary | ICD-10-CM | POA: Diagnosis not present

## 2019-05-20 DIAGNOSIS — M8580 Other specified disorders of bone density and structure, unspecified site: Secondary | ICD-10-CM | POA: Diagnosis not present

## 2019-05-20 DIAGNOSIS — I69318 Other symptoms and signs involving cognitive functions following cerebral infarction: Secondary | ICD-10-CM | POA: Diagnosis not present

## 2019-05-20 DIAGNOSIS — J449 Chronic obstructive pulmonary disease, unspecified: Secondary | ICD-10-CM | POA: Diagnosis not present

## 2019-05-20 DIAGNOSIS — I10 Essential (primary) hypertension: Secondary | ICD-10-CM | POA: Diagnosis not present

## 2019-05-20 DIAGNOSIS — Z48812 Encounter for surgical aftercare following surgery on the circulatory system: Secondary | ICD-10-CM | POA: Diagnosis not present

## 2019-05-20 DIAGNOSIS — I69398 Other sequelae of cerebral infarction: Secondary | ICD-10-CM | POA: Diagnosis not present

## 2019-05-20 DIAGNOSIS — R911 Solitary pulmonary nodule: Secondary | ICD-10-CM | POA: Diagnosis not present

## 2019-05-20 DIAGNOSIS — I69354 Hemiplegia and hemiparesis following cerebral infarction affecting left non-dominant side: Secondary | ICD-10-CM | POA: Diagnosis not present

## 2019-05-20 DIAGNOSIS — M810 Age-related osteoporosis without current pathological fracture: Secondary | ICD-10-CM | POA: Diagnosis not present

## 2019-05-20 DIAGNOSIS — I959 Hypotension, unspecified: Secondary | ICD-10-CM | POA: Diagnosis not present

## 2019-05-20 NOTE — Patient Outreach (Signed)
First telephone outreach attempt to obtain mRs. Left message for returned call.   [facility] Management Assistant

## 2019-05-28 DIAGNOSIS — I69318 Other symptoms and signs involving cognitive functions following cerebral infarction: Secondary | ICD-10-CM | POA: Diagnosis not present

## 2019-05-28 DIAGNOSIS — G9340 Encephalopathy, unspecified: Secondary | ICD-10-CM | POA: Diagnosis not present

## 2019-05-28 DIAGNOSIS — I69354 Hemiplegia and hemiparesis following cerebral infarction affecting left non-dominant side: Secondary | ICD-10-CM | POA: Diagnosis not present

## 2019-05-28 DIAGNOSIS — Z48812 Encounter for surgical aftercare following surgery on the circulatory system: Secondary | ICD-10-CM | POA: Diagnosis not present

## 2019-05-28 DIAGNOSIS — R911 Solitary pulmonary nodule: Secondary | ICD-10-CM | POA: Diagnosis not present

## 2019-05-28 DIAGNOSIS — K259 Gastric ulcer, unspecified as acute or chronic, without hemorrhage or perforation: Secondary | ICD-10-CM | POA: Diagnosis not present

## 2019-05-28 DIAGNOSIS — J449 Chronic obstructive pulmonary disease, unspecified: Secondary | ICD-10-CM | POA: Diagnosis not present

## 2019-05-28 DIAGNOSIS — R471 Dysarthria and anarthria: Secondary | ICD-10-CM | POA: Diagnosis not present

## 2019-05-28 DIAGNOSIS — M810 Age-related osteoporosis without current pathological fracture: Secondary | ICD-10-CM | POA: Diagnosis not present

## 2019-05-28 DIAGNOSIS — G8929 Other chronic pain: Secondary | ICD-10-CM | POA: Diagnosis not present

## 2019-05-28 DIAGNOSIS — E119 Type 2 diabetes mellitus without complications: Secondary | ICD-10-CM | POA: Diagnosis not present

## 2019-05-28 DIAGNOSIS — G43909 Migraine, unspecified, not intractable, without status migrainosus: Secondary | ICD-10-CM | POA: Diagnosis not present

## 2019-05-28 DIAGNOSIS — I69398 Other sequelae of cerebral infarction: Secondary | ICD-10-CM | POA: Diagnosis not present

## 2019-05-28 DIAGNOSIS — M21372 Foot drop, left foot: Secondary | ICD-10-CM | POA: Diagnosis not present

## 2019-05-28 DIAGNOSIS — I959 Hypotension, unspecified: Secondary | ICD-10-CM | POA: Diagnosis not present

## 2019-05-28 DIAGNOSIS — E785 Hyperlipidemia, unspecified: Secondary | ICD-10-CM | POA: Diagnosis not present

## 2019-05-28 DIAGNOSIS — I69392 Facial weakness following cerebral infarction: Secondary | ICD-10-CM | POA: Diagnosis not present

## 2019-05-28 DIAGNOSIS — M8580 Other specified disorders of bone density and structure, unspecified site: Secondary | ICD-10-CM | POA: Diagnosis not present

## 2019-05-28 DIAGNOSIS — M791 Myalgia, unspecified site: Secondary | ICD-10-CM | POA: Diagnosis not present

## 2019-05-28 DIAGNOSIS — M170 Bilateral primary osteoarthritis of knee: Secondary | ICD-10-CM | POA: Diagnosis not present

## 2019-05-28 DIAGNOSIS — I10 Essential (primary) hypertension: Secondary | ICD-10-CM | POA: Diagnosis not present

## 2019-05-28 DIAGNOSIS — G4700 Insomnia, unspecified: Secondary | ICD-10-CM | POA: Diagnosis not present

## 2019-05-31 DIAGNOSIS — Z853 Personal history of malignant neoplasm of breast: Secondary | ICD-10-CM | POA: Diagnosis not present

## 2019-05-31 DIAGNOSIS — I63511 Cerebral infarction due to unspecified occlusion or stenosis of right middle cerebral artery: Secondary | ICD-10-CM | POA: Diagnosis not present

## 2019-05-31 DIAGNOSIS — E119 Type 2 diabetes mellitus without complications: Secondary | ICD-10-CM | POA: Diagnosis not present

## 2019-06-01 ENCOUNTER — Other Ambulatory Visit: Payer: Self-pay

## 2019-06-01 NOTE — Patient Outreach (Signed)
Second telephone outreach attempt to obtain mRs. No answer. Left message for returned call.  [facility] Management Assistant

## 2019-06-02 DIAGNOSIS — I10 Essential (primary) hypertension: Secondary | ICD-10-CM | POA: Diagnosis not present

## 2019-06-03 DIAGNOSIS — M545 Low back pain: Secondary | ICD-10-CM | POA: Diagnosis not present

## 2019-06-03 DIAGNOSIS — G894 Chronic pain syndrome: Secondary | ICD-10-CM | POA: Diagnosis not present

## 2019-06-03 DIAGNOSIS — Z79891 Long term (current) use of opiate analgesic: Secondary | ICD-10-CM | POA: Diagnosis not present

## 2019-06-04 DIAGNOSIS — I959 Hypotension, unspecified: Secondary | ICD-10-CM | POA: Diagnosis not present

## 2019-06-04 DIAGNOSIS — G8929 Other chronic pain: Secondary | ICD-10-CM | POA: Diagnosis not present

## 2019-06-04 DIAGNOSIS — R471 Dysarthria and anarthria: Secondary | ICD-10-CM | POA: Diagnosis not present

## 2019-06-04 DIAGNOSIS — M170 Bilateral primary osteoarthritis of knee: Secondary | ICD-10-CM | POA: Diagnosis not present

## 2019-06-04 DIAGNOSIS — M8580 Other specified disorders of bone density and structure, unspecified site: Secondary | ICD-10-CM | POA: Diagnosis not present

## 2019-06-04 DIAGNOSIS — M810 Age-related osteoporosis without current pathological fracture: Secondary | ICD-10-CM | POA: Diagnosis not present

## 2019-06-04 DIAGNOSIS — R911 Solitary pulmonary nodule: Secondary | ICD-10-CM | POA: Diagnosis not present

## 2019-06-04 DIAGNOSIS — M791 Myalgia, unspecified site: Secondary | ICD-10-CM | POA: Diagnosis not present

## 2019-06-04 DIAGNOSIS — E785 Hyperlipidemia, unspecified: Secondary | ICD-10-CM | POA: Diagnosis not present

## 2019-06-04 DIAGNOSIS — K259 Gastric ulcer, unspecified as acute or chronic, without hemorrhage or perforation: Secondary | ICD-10-CM | POA: Diagnosis not present

## 2019-06-04 DIAGNOSIS — I69354 Hemiplegia and hemiparesis following cerebral infarction affecting left non-dominant side: Secondary | ICD-10-CM | POA: Diagnosis not present

## 2019-06-04 DIAGNOSIS — J449 Chronic obstructive pulmonary disease, unspecified: Secondary | ICD-10-CM | POA: Diagnosis not present

## 2019-06-04 DIAGNOSIS — M21372 Foot drop, left foot: Secondary | ICD-10-CM | POA: Diagnosis not present

## 2019-06-04 DIAGNOSIS — G9340 Encephalopathy, unspecified: Secondary | ICD-10-CM | POA: Diagnosis not present

## 2019-06-04 DIAGNOSIS — E041 Nontoxic single thyroid nodule: Secondary | ICD-10-CM | POA: Diagnosis not present

## 2019-06-04 DIAGNOSIS — G4700 Insomnia, unspecified: Secondary | ICD-10-CM | POA: Diagnosis not present

## 2019-06-04 DIAGNOSIS — I10 Essential (primary) hypertension: Secondary | ICD-10-CM | POA: Diagnosis not present

## 2019-06-04 DIAGNOSIS — I69318 Other symptoms and signs involving cognitive functions following cerebral infarction: Secondary | ICD-10-CM | POA: Diagnosis not present

## 2019-06-04 DIAGNOSIS — G43909 Migraine, unspecified, not intractable, without status migrainosus: Secondary | ICD-10-CM | POA: Diagnosis not present

## 2019-06-04 DIAGNOSIS — E119 Type 2 diabetes mellitus without complications: Secondary | ICD-10-CM | POA: Diagnosis not present

## 2019-06-04 DIAGNOSIS — I69398 Other sequelae of cerebral infarction: Secondary | ICD-10-CM | POA: Diagnosis not present

## 2019-06-04 DIAGNOSIS — I69392 Facial weakness following cerebral infarction: Secondary | ICD-10-CM | POA: Diagnosis not present

## 2019-06-05 DIAGNOSIS — E785 Hyperlipidemia, unspecified: Secondary | ICD-10-CM

## 2019-06-05 DIAGNOSIS — Z9013 Acquired absence of bilateral breasts and nipples: Secondary | ICD-10-CM

## 2019-06-05 DIAGNOSIS — J449 Chronic obstructive pulmonary disease, unspecified: Secondary | ICD-10-CM

## 2019-06-05 DIAGNOSIS — F17210 Nicotine dependence, cigarettes, uncomplicated: Secondary | ICD-10-CM

## 2019-06-05 DIAGNOSIS — G9340 Encephalopathy, unspecified: Secondary | ICD-10-CM

## 2019-06-05 DIAGNOSIS — R471 Dysarthria and anarthria: Secondary | ICD-10-CM

## 2019-06-05 DIAGNOSIS — Z9181 History of falling: Secondary | ICD-10-CM

## 2019-06-05 DIAGNOSIS — I69318 Other symptoms and signs involving cognitive functions following cerebral infarction: Secondary | ICD-10-CM

## 2019-06-05 DIAGNOSIS — K259 Gastric ulcer, unspecified as acute or chronic, without hemorrhage or perforation: Secondary | ICD-10-CM

## 2019-06-05 DIAGNOSIS — M810 Age-related osteoporosis without current pathological fracture: Secondary | ICD-10-CM

## 2019-06-05 DIAGNOSIS — M21372 Foot drop, left foot: Secondary | ICD-10-CM | POA: Diagnosis not present

## 2019-06-05 DIAGNOSIS — Z79899 Other long term (current) drug therapy: Secondary | ICD-10-CM

## 2019-06-05 DIAGNOSIS — F329 Major depressive disorder, single episode, unspecified: Secondary | ICD-10-CM

## 2019-06-05 DIAGNOSIS — E041 Nontoxic single thyroid nodule: Secondary | ICD-10-CM

## 2019-06-05 DIAGNOSIS — Z7902 Long term (current) use of antithrombotics/antiplatelets: Secondary | ICD-10-CM

## 2019-06-05 DIAGNOSIS — I10 Essential (primary) hypertension: Secondary | ICD-10-CM

## 2019-06-05 DIAGNOSIS — M170 Bilateral primary osteoarthritis of knee: Secondary | ICD-10-CM

## 2019-06-05 DIAGNOSIS — Z794 Long term (current) use of insulin: Secondary | ICD-10-CM

## 2019-06-05 DIAGNOSIS — I69354 Hemiplegia and hemiparesis following cerebral infarction affecting left non-dominant side: Secondary | ICD-10-CM | POA: Diagnosis not present

## 2019-06-05 DIAGNOSIS — G43909 Migraine, unspecified, not intractable, without status migrainosus: Secondary | ICD-10-CM

## 2019-06-05 DIAGNOSIS — Z853 Personal history of malignant neoplasm of breast: Secondary | ICD-10-CM

## 2019-06-05 DIAGNOSIS — Z9884 Bariatric surgery status: Secondary | ICD-10-CM

## 2019-06-05 DIAGNOSIS — M791 Myalgia, unspecified site: Secondary | ICD-10-CM

## 2019-06-05 DIAGNOSIS — I69398 Other sequelae of cerebral infarction: Secondary | ICD-10-CM | POA: Diagnosis not present

## 2019-06-05 DIAGNOSIS — M8580 Other specified disorders of bone density and structure, unspecified site: Secondary | ICD-10-CM

## 2019-06-05 DIAGNOSIS — K5909 Other constipation: Secondary | ICD-10-CM

## 2019-06-05 DIAGNOSIS — I959 Hypotension, unspecified: Secondary | ICD-10-CM

## 2019-06-05 DIAGNOSIS — Z7982 Long term (current) use of aspirin: Secondary | ICD-10-CM

## 2019-06-05 DIAGNOSIS — I69392 Facial weakness following cerebral infarction: Secondary | ICD-10-CM | POA: Diagnosis not present

## 2019-06-05 DIAGNOSIS — E119 Type 2 diabetes mellitus without complications: Secondary | ICD-10-CM

## 2019-06-05 DIAGNOSIS — G4700 Insomnia, unspecified: Secondary | ICD-10-CM

## 2019-06-05 DIAGNOSIS — G8929 Other chronic pain: Secondary | ICD-10-CM

## 2019-06-05 DIAGNOSIS — F419 Anxiety disorder, unspecified: Secondary | ICD-10-CM

## 2019-06-05 DIAGNOSIS — R911 Solitary pulmonary nodule: Secondary | ICD-10-CM

## 2019-06-05 DIAGNOSIS — Z79891 Long term (current) use of opiate analgesic: Secondary | ICD-10-CM

## 2019-06-09 ENCOUNTER — Other Ambulatory Visit: Payer: Self-pay

## 2019-06-09 NOTE — Patient Outreach (Signed)
Telephone outreach to patient's son to obtain mRS was successfully completed. MRS=2.   [place] Management Assistant [phone]

## 2019-06-10 DIAGNOSIS — I69398 Other sequelae of cerebral infarction: Secondary | ICD-10-CM | POA: Diagnosis not present

## 2019-06-10 DIAGNOSIS — G8929 Other chronic pain: Secondary | ICD-10-CM | POA: Diagnosis not present

## 2019-06-10 DIAGNOSIS — G4700 Insomnia, unspecified: Secondary | ICD-10-CM | POA: Diagnosis not present

## 2019-06-10 DIAGNOSIS — J449 Chronic obstructive pulmonary disease, unspecified: Secondary | ICD-10-CM | POA: Diagnosis not present

## 2019-06-10 DIAGNOSIS — R911 Solitary pulmonary nodule: Secondary | ICD-10-CM | POA: Diagnosis not present

## 2019-06-10 DIAGNOSIS — I69354 Hemiplegia and hemiparesis following cerebral infarction affecting left non-dominant side: Secondary | ICD-10-CM | POA: Diagnosis not present

## 2019-06-10 DIAGNOSIS — R471 Dysarthria and anarthria: Secondary | ICD-10-CM | POA: Diagnosis not present

## 2019-06-10 DIAGNOSIS — M791 Myalgia, unspecified site: Secondary | ICD-10-CM | POA: Diagnosis not present

## 2019-06-10 DIAGNOSIS — E041 Nontoxic single thyroid nodule: Secondary | ICD-10-CM | POA: Diagnosis not present

## 2019-06-10 DIAGNOSIS — M21372 Foot drop, left foot: Secondary | ICD-10-CM | POA: Diagnosis not present

## 2019-06-10 DIAGNOSIS — E785 Hyperlipidemia, unspecified: Secondary | ICD-10-CM | POA: Diagnosis not present

## 2019-06-10 DIAGNOSIS — I10 Essential (primary) hypertension: Secondary | ICD-10-CM | POA: Diagnosis not present

## 2019-06-10 DIAGNOSIS — M810 Age-related osteoporosis without current pathological fracture: Secondary | ICD-10-CM | POA: Diagnosis not present

## 2019-06-10 DIAGNOSIS — E119 Type 2 diabetes mellitus without complications: Secondary | ICD-10-CM | POA: Diagnosis not present

## 2019-06-10 DIAGNOSIS — G9340 Encephalopathy, unspecified: Secondary | ICD-10-CM | POA: Diagnosis not present

## 2019-06-10 DIAGNOSIS — G43909 Migraine, unspecified, not intractable, without status migrainosus: Secondary | ICD-10-CM | POA: Diagnosis not present

## 2019-06-10 DIAGNOSIS — I69392 Facial weakness following cerebral infarction: Secondary | ICD-10-CM | POA: Diagnosis not present

## 2019-06-10 DIAGNOSIS — I69318 Other symptoms and signs involving cognitive functions following cerebral infarction: Secondary | ICD-10-CM | POA: Diagnosis not present

## 2019-06-10 DIAGNOSIS — M8580 Other specified disorders of bone density and structure, unspecified site: Secondary | ICD-10-CM | POA: Diagnosis not present

## 2019-06-10 DIAGNOSIS — K259 Gastric ulcer, unspecified as acute or chronic, without hemorrhage or perforation: Secondary | ICD-10-CM | POA: Diagnosis not present

## 2019-06-10 DIAGNOSIS — I959 Hypotension, unspecified: Secondary | ICD-10-CM | POA: Diagnosis not present

## 2019-06-10 DIAGNOSIS — M170 Bilateral primary osteoarthritis of knee: Secondary | ICD-10-CM | POA: Diagnosis not present

## 2019-06-14 NOTE — Progress Notes (Signed)
Virtual Visit via Video Note   This visit type was conducted due to national recommendations for restrictions regarding the COVID-19 Pandemic (e.g. social distancing) in an effort to limit this patient's exposure and mitigate transmission in our community.  Due to her co-morbid illnesses, this patient is at least at moderate risk for complications without adequate follow up.  This format is felt to be most appropriate for this patient at this time.  All issues noted in this document were discussed and addressed.  A limited physical exam was performed with this format.  Please refer to the patient's chart for her consent to telehealth for [facility].   The patient was identified using 2 identifiers.  Date:  06/16/2019   ID:  Ann Lopez, DOB 1971-03-30, MRN [id number]  Patient Location: Home Provider Location: Office  PCP:  Hayden Rasmussen, MD  Cardiologist:  Evalina Field, MD   Evaluation Performed:  Follow-Up Visit  Chief Complaint:  HLD  History of Present Illness:    Ann Lopez is a 49 y.o. female with CVA (R ICA stenosis s/p stent), DM, HTG who presents for follow-up. Recent stroke and TGs 515. Started on high-intensity statin and vascepa. Apparently only cholesterol level was drawn. She reports she was not fasting for that lab draw anyway. She reports she is recovering well from her stroke. She is now walking with a cane and out of the wheelchair. She did receive her cardiac monitor but there were issues with the timing and she just sent it back. She denies CP/SOB with her current level of activity. She reports she can walk around her house without major limitations. She has stayed off cigarettes. She reports that she has worked on improving her diet and has her A1c in the 7-8 range but does not know the value. She remains on ASA/brillinta. No bleeding issues. She reports no issues with crestor and vasepa. Her PCP wants to start repatha.   Problem List 1. R MCA CVA 2/2  R ICA stenosis s/p stent placement 03/02/2019 2. Diabetes (A1c 9.0) 3. Hypertriglyceridemia (HDL 23, LDL 49, TG 515, T chol 166) 4. Tobacco Abuse   The patient does not have symptoms concerning for COVID-19 infection (fever, chills, cough, or new shortness of breath).    Past Medical History:  Diagnosis Date  . Allergy   . Anxiety   . Asthma   . Back pain   . Breast cancer ([place])   . Cancer ([place])   . Depression   . Diabetes mellitus   . Headache   . Hyperlipemia   . Osteopenia   . Osteoporosis    osteopenia  . Stroke [facility])    Past Surgical History:  Procedure Laterality Date  . ABDOMINAL HYSTERECTOMY     B oophorectomy for BRCA1 gene  . BREAST SURGERY    . CESAREAN SECTION    . INCISION AND DRAINAGE ABSCESS Left 11/14/2012   Procedure: INCISION AND DRAINAGE ABSCESS LEFT GROIN;  Surgeon: Jamesetta So, MD;  Location: [facility];  Service: General;  Laterality: Left;  . IR ANGIO VERTEBRAL SEL SUBCLAVIAN INNOMINATE UNI R MOD SED  03/02/2019  . IR CT HEAD LTD  03/02/2019  . IR INTRAVSC STENT CERV CAROTID W/O EMB-PROT MOD SED INC ANGIO  03/02/2019  . IR PERCUTANEOUS ART THROMBECTOMY/INFUSION INTRACRANIAL INC DIAG ANGIO  03/02/2019  . LYMPHADENECTOMY    . MASTECTOMY Bilateral   . RADIOLOGY WITH ANESTHESIA N/A 03/02/2019   Procedure: IR WITH ANESTHESIA;  Surgeon: Luanne Bras, MD;  Location: [place];  Service: Radiology;  Laterality: N/A;     Current Meds  Medication Sig  . acetaminophen (TYLENOL) 325 MG tablet Take 1-2 tablets (325-650 mg total) by mouth every 4 (four) hours as needed for mild pain.  Marland Kitchen albuterol (PROVENTIL HFA;VENTOLIN HFA) 108 (90 Base) MCG/ACT inhaler Inhale 1 puff into the lungs every 6 (six) hours as needed for wheezing or shortness of breath.  Marland Kitchen aspirin EC 81 MG tablet Take 1 tablet (81 mg total) by mouth daily.  . canagliflozin (INVOKANA) 300 MG TABS tablet Take 1 tablet (300 mg total) by mouth daily before breakfast.  . clonazePAM (KLONOPIN) 0.5 MG  tablet Take 0.5 tablets (0.25 mg total) by mouth 2 (two) times daily as needed for anxiety.  . docusate sodium (COLACE) 100 MG capsule Take 200 mg by mouth at bedtime.  . DULoxetine (CYMBALTA) 60 MG capsule TAKE 1 CAPSULE BY MOUTH EVERY DAY (Patient taking differently: Take 60 mg by mouth daily. )  . glucose blood (CHOICE DM FORA G20 TEST STRIPS) test strip Use as instructed  . HYDROcodone-acetaminophen (NORCO) 10-325 MG tablet Take 1 tablet by mouth every 6 (six) hours as needed for moderate pain.   Marland Kitchen icosapent Ethyl (VASCEPA) 1 g capsule Take 2 capsules (2 g total) by mouth 2 (two) times daily.  . Insulin Glargine (LANTUS) 100 UNIT/ML Solostar Pen Inject 15 Units into the skin daily. (Patient taking differently: Inject 15 Units into the skin at bedtime. )  . Insulin Pen Needle (PEN NEEDLES) 32G X 5 MM MISC 1 application by Does not apply route at bedtime.  . Lancets (ONETOUCH ULTRASOFT) lancets Use as instructed; check sugar before meals and at bedtime  . Levocetirizine Dihydrochloride (XYZAL PO) Take by mouth at bedtime.  . mometasone (NASONEX) 50 MCG/ACT nasal spray Place 2 sprays into the nose daily.  . montelukast (SINGULAIR) 10 MG tablet Take 10 mg by mouth at bedtime.  . pantoprazole (PROTONIX) 40 MG tablet Take 1 tablet (40 mg total) by mouth daily.  . potassium chloride (KLOR-CON) 10 MEQ tablet Take 1 tablet (10 mEq total) by mouth 2 (two) times daily. (Patient taking differently: Take 10 mEq by mouth daily. )  . rosuvastatin (CRESTOR) 40 MG tablet Take 1 tablet (40 mg total) by mouth daily.  . temazepam (RESTORIL) 15 MG capsule Take 1 capsule (15 mg total) by mouth at bedtime as needed for sleep.  . ticagrelor (BRILINTA) 90 MG TABS tablet Take 1 tablet (90 mg total) by mouth 2 (two) times daily.     Allergies:   Azithromycin, Morphine and related, Nitrofurantoin, Penicillins, and Sulfonamide derivatives   Social History   Tobacco Use  . Smoking status: Former Smoker    Packs/day:  0.50    Types: Cigarettes    Quit date: 03/02/2019    Years since quitting: 0.2  . Smokeless tobacco: Never Used  Substance Use Topics  . Alcohol use: No    Alcohol/week: 0.0 standard drinks  . Drug use: No     Family Hx: The patient's family history includes Cancer in her sister; Cancer (age of onset: 90) in her mother; Cancer (age of onset: 87) in her maternal grandfather and maternal grandmother; Hypertension in her sister; Hypothyroidism in her brother and sister.  ROS:   Please see the history of present illness.     All other systems reviewed and are negative.   Prior CV studies:   The following studies were reviewed today:  TTE 03/03/2019 1. Left ventricular ejection fraction, by visual estimation, is 60 to  65%. The left ventricle has normal function. There is no left ventricular  hypertrophy.  2. The left ventricle has no regional wall motion abnormalities.  3. Global right ventricle has normal systolic function.The right  ventricular size is normal. No increase in right ventricular wall  thickness.  4. Left atrial size was normal.  5. Right atrial size was normal.  6. The mitral valve is normal in structure. No evidence of mitral valve  regurgitation. No evidence of mitral stenosis.  7. The tricuspid valve is normal in structure. Tricuspid valve  regurgitation is not demonstrated.  8. The aortic valve is normal in structure. Aortic valve regurgitation is  not visualized. No evidence of aortic valve sclerosis or stenosis.  9. The pulmonic valve was normal in structure. Pulmonic valve  regurgitation is not visualized.  10. Normal pulmonary artery systolic pressure.  11. The inferior vena cava is dilated in size with >50% respiratory  variability, suggesting right atrial pressure of 8 mmHg.   Labs/Other Tests and Data Reviewed:    EKG:  No ECG reviewed.  Recent Labs: 03/07/2019: Magnesium 1.9 05/03/2019: ALT 33; BUN 10; Creatinine, Ser 0.90;  Hemoglobin 15.3; Platelets 297; Potassium 3.2; Sodium 140   Recent Lipid Panel Lab Results  Component Value Date/Time   CHOL 166 06/15/2019 11:55 AM   TRIG 515 (H) 03/03/2019 04:46 AM   HDL 23 (L) 03/03/2019 04:46 AM   CHOLHDL 7.2 03/03/2019 04:46 AM   LDLCALC UNABLE TO CALCULATE IF TRIGLYCERIDE OVER 400 mg/dL 03/03/2019 04:46 AM   LDLDIRECT 39.4 03/04/2019 12:50 PM    Wt Readings from Last 3 Encounters:  06/16/19 169 lb (76.7 kg)  05/18/19 178 lb 12.8 oz (81.1 kg)  05/08/19 169 lb (76.7 kg)     Objective:    Vital Signs:  Ht 5' 2"  (1.575 m)   Wt 169 lb (76.7 kg)   BMI 30.91 kg/m    VITAL SIGNS:  reviewed  Gen: NAD Pulm: No increased work of breathing by video assessment Psych: normal mood/affect   ASSESSMENT & PLAN:    1. Mixed hyperlipidemia 2. Hypertriglyceridemia -main issues is TGs. Repeat labs did not include this. We will repeat a lipid profile with direct LDL to see effect of crestor and vascepa. Her PCP wants to start repatha but this will have no effect on TG which are the problem. Her direct LDL is at goal 39. I will reach out to PCP as I do not think repatha is warranted here. We will get repeat labs this week and then I will call her back this week. I will see her in office in 3 months.   3. Cerebrovascular accident (CVA) due to occlusion of right carotid artery ([place]) -continue ASA/brillinta. She will follow with interventional radiology for this.  -clearly her stroke came from R ICA stenosis. No need for cardiac monitor.  -will continue to work on diabetes   COVID-19 Education: The signs and symptoms of COVID-19 were discussed with the patient and how to seek care for testing (follow up with PCP or arrange E-visit).  The importance of social distancing was discussed today.  Time:   Today, I have spent 35 minutes with the patient with telehealth technology discussing the above problems.    Medication Adjustments/Labs and Tests Ordered: Current medicines  are reviewed at length with the patient today.  Concerns regarding medicines are outlined above.   Tests Ordered: Orders Placed  This Encounter  Procedures  . Lipid panel  . Direct LDL   Medication Changes: No orders of the defined types were placed in this encounter.  Follow Up:  Virtual Visit  in 3 month(s)  Signed, Evalina Field, MD  06/16/2019 9:03 AM    [place]

## 2019-06-15 ENCOUNTER — Other Ambulatory Visit (HOSPITAL_COMMUNITY)
Admission: RE | Admit: 2019-06-15 | Discharge: 2019-06-15 | Disposition: A | Discharge status: Home / Self Care | Payer: Medicare Other | Source: Ambulatory Visit | Attending: Cardiovascular Disease | Admitting: Cardiovascular Disease

## 2019-06-15 ENCOUNTER — Other Ambulatory Visit: Payer: Self-pay

## 2019-06-15 DIAGNOSIS — E781 Pure hyperglyceridemia: Secondary | ICD-10-CM | POA: Insufficient documentation

## 2019-06-15 DIAGNOSIS — I63511 Cerebral infarction due to unspecified occlusion or stenosis of right middle cerebral artery: Secondary | ICD-10-CM | POA: Insufficient documentation

## 2019-06-15 LAB — CHOLESTEROL, TOTAL: Cholesterol: 166 mg/dL (ref 0–200)

## 2019-06-16 ENCOUNTER — Telehealth (INDEPENDENT_AMBULATORY_CARE_PROVIDER_SITE_OTHER): Payer: Medicare Other | Admitting: Cardiovascular Disease

## 2019-06-16 VITALS — Ht 62.0 in | Wt 169.0 lb

## 2019-06-16 DIAGNOSIS — E782 Mixed hyperlipidemia: Secondary | ICD-10-CM | POA: Diagnosis not present

## 2019-06-16 DIAGNOSIS — Z8673 Personal history of transient ischemic attack (TIA), and cerebral infarction without residual deficits: Secondary | ICD-10-CM | POA: Diagnosis not present

## 2019-06-16 DIAGNOSIS — E781 Pure hyperglyceridemia: Secondary | ICD-10-CM

## 2019-06-16 DIAGNOSIS — I63231 Cerebral infarction due to unspecified occlusion or stenosis of right carotid arteries: Secondary | ICD-10-CM

## 2019-06-16 NOTE — Patient Instructions (Signed)
Medication Instructions:  The current medical regimen is effective;  continue present plan and medications.  *If you need a refill on your cardiac medications before your next appointment, please call your pharmacy*   Lab Work: Lipid panel  Attached are the lab orders that are needed before your upcoming appointment, please come in on Thursday, anytime to have your labs drawn.   They are fasting labs, so nothing to eat or drink after midnight.  Lab hours: 8:00-4:00 lunch hours 12:45-1:45  If you have labs (blood work) drawn today and your tests are completely normal, you will receive your results only by: Marland Kitchen MyChart Message (if you have MyChart) OR . A paper copy in the mail If you have any lab test that is abnormal or we need to change your treatment, we will call you to review the results.   Follow-Up: At [facility], you and your health needs are our priority.  As part of our continuing mission to provide you with exceptional heart care, we have created designated Provider Care Teams.  These Care Teams include your primary Cardiologist (physician) and Advanced Practice Providers (APPs -  Physician Assistants and Nurse Practitioners) who all work together to provide you with the care you need, when you need it.  We recommend signing up for the patient portal called "MyChart".  Sign up information is provided on this After Visit Summary.  MyChart is used to connect with patients for Virtual Visits (Telemedicine).  Patients are able to view lab/test results, encounter notes, upcoming appointments, etc.  Non-urgent messages can be sent to your provider as well.   To learn more about what you can do with MyChart, go to [email or web address].    Your next appointment:   3 month(s)  The format for your next appointment:   Virtual Visit   Provider:   Eleonore Chiquito, MD

## 2019-06-17 DIAGNOSIS — R471 Dysarthria and anarthria: Secondary | ICD-10-CM | POA: Diagnosis not present

## 2019-06-17 DIAGNOSIS — I959 Hypotension, unspecified: Secondary | ICD-10-CM | POA: Diagnosis not present

## 2019-06-17 DIAGNOSIS — I69392 Facial weakness following cerebral infarction: Secondary | ICD-10-CM | POA: Diagnosis not present

## 2019-06-17 DIAGNOSIS — M8580 Other specified disorders of bone density and structure, unspecified site: Secondary | ICD-10-CM | POA: Diagnosis not present

## 2019-06-17 DIAGNOSIS — I69318 Other symptoms and signs involving cognitive functions following cerebral infarction: Secondary | ICD-10-CM | POA: Diagnosis not present

## 2019-06-17 DIAGNOSIS — G8929 Other chronic pain: Secondary | ICD-10-CM | POA: Diagnosis not present

## 2019-06-17 DIAGNOSIS — I69398 Other sequelae of cerebral infarction: Secondary | ICD-10-CM | POA: Diagnosis not present

## 2019-06-17 DIAGNOSIS — G4700 Insomnia, unspecified: Secondary | ICD-10-CM | POA: Diagnosis not present

## 2019-06-17 DIAGNOSIS — M170 Bilateral primary osteoarthritis of knee: Secondary | ICD-10-CM | POA: Diagnosis not present

## 2019-06-17 DIAGNOSIS — M810 Age-related osteoporosis without current pathological fracture: Secondary | ICD-10-CM | POA: Diagnosis not present

## 2019-06-17 DIAGNOSIS — K259 Gastric ulcer, unspecified as acute or chronic, without hemorrhage or perforation: Secondary | ICD-10-CM | POA: Diagnosis not present

## 2019-06-17 DIAGNOSIS — I10 Essential (primary) hypertension: Secondary | ICD-10-CM | POA: Diagnosis not present

## 2019-06-17 DIAGNOSIS — R911 Solitary pulmonary nodule: Secondary | ICD-10-CM | POA: Diagnosis not present

## 2019-06-17 DIAGNOSIS — M791 Myalgia, unspecified site: Secondary | ICD-10-CM | POA: Diagnosis not present

## 2019-06-17 DIAGNOSIS — E785 Hyperlipidemia, unspecified: Secondary | ICD-10-CM | POA: Diagnosis not present

## 2019-06-17 DIAGNOSIS — G43909 Migraine, unspecified, not intractable, without status migrainosus: Secondary | ICD-10-CM | POA: Diagnosis not present

## 2019-06-17 DIAGNOSIS — G9340 Encephalopathy, unspecified: Secondary | ICD-10-CM | POA: Diagnosis not present

## 2019-06-17 DIAGNOSIS — J449 Chronic obstructive pulmonary disease, unspecified: Secondary | ICD-10-CM | POA: Diagnosis not present

## 2019-06-17 DIAGNOSIS — M21372 Foot drop, left foot: Secondary | ICD-10-CM | POA: Diagnosis not present

## 2019-06-17 DIAGNOSIS — E119 Type 2 diabetes mellitus without complications: Secondary | ICD-10-CM | POA: Diagnosis not present

## 2019-06-17 DIAGNOSIS — I69354 Hemiplegia and hemiparesis following cerebral infarction affecting left non-dominant side: Secondary | ICD-10-CM | POA: Diagnosis not present

## 2019-06-17 DIAGNOSIS — E041 Nontoxic single thyroid nodule: Secondary | ICD-10-CM | POA: Diagnosis not present

## 2019-06-18 ENCOUNTER — Other Ambulatory Visit: Payer: Self-pay

## 2019-06-18 DIAGNOSIS — E782 Mixed hyperlipidemia: Secondary | ICD-10-CM | POA: Diagnosis not present

## 2019-06-18 DIAGNOSIS — I63511 Cerebral infarction due to unspecified occlusion or stenosis of right middle cerebral artery: Secondary | ICD-10-CM

## 2019-06-18 LAB — LIPID PANEL
Chol/HDL Ratio: 3.6 ratio (ref 0.0–4.4)
Cholesterol, Total: 144 mg/dL (ref 100–199)
HDL: 40 mg/dL (ref >39)
LDL Chol Calc (NIH): 64 mg/dL (ref 0–99)
Triglycerides: 245 mg/dL — ABNORMAL HIGH (ref 0–149)
VLDL Cholesterol Cal: 40 mg/dL (ref 5–40)

## 2019-06-18 LAB — LDL CHOLESTEROL, DIRECT: LDL Direct: 65 mg/dL (ref 0–99)

## 2019-06-23 DIAGNOSIS — M810 Age-related osteoporosis without current pathological fracture: Secondary | ICD-10-CM

## 2019-06-23 DIAGNOSIS — K259 Gastric ulcer, unspecified as acute or chronic, without hemorrhage or perforation: Secondary | ICD-10-CM

## 2019-06-23 DIAGNOSIS — M8580 Other specified disorders of bone density and structure, unspecified site: Secondary | ICD-10-CM

## 2019-06-23 DIAGNOSIS — G8929 Other chronic pain: Secondary | ICD-10-CM

## 2019-06-23 DIAGNOSIS — M791 Myalgia, unspecified site: Secondary | ICD-10-CM

## 2019-06-23 DIAGNOSIS — I69398 Other sequelae of cerebral infarction: Secondary | ICD-10-CM | POA: Diagnosis not present

## 2019-06-23 DIAGNOSIS — Z7982 Long term (current) use of aspirin: Secondary | ICD-10-CM

## 2019-06-23 DIAGNOSIS — I69392 Facial weakness following cerebral infarction: Secondary | ICD-10-CM | POA: Diagnosis not present

## 2019-06-23 DIAGNOSIS — I10 Essential (primary) hypertension: Secondary | ICD-10-CM

## 2019-06-23 DIAGNOSIS — Z9013 Acquired absence of bilateral breasts and nipples: Secondary | ICD-10-CM

## 2019-06-23 DIAGNOSIS — M170 Bilateral primary osteoarthritis of knee: Secondary | ICD-10-CM

## 2019-06-23 DIAGNOSIS — M21372 Foot drop, left foot: Secondary | ICD-10-CM | POA: Diagnosis not present

## 2019-06-23 DIAGNOSIS — G9340 Encephalopathy, unspecified: Secondary | ICD-10-CM

## 2019-06-23 DIAGNOSIS — F17210 Nicotine dependence, cigarettes, uncomplicated: Secondary | ICD-10-CM

## 2019-06-23 DIAGNOSIS — F329 Major depressive disorder, single episode, unspecified: Secondary | ICD-10-CM

## 2019-06-23 DIAGNOSIS — E785 Hyperlipidemia, unspecified: Secondary | ICD-10-CM

## 2019-06-23 DIAGNOSIS — R911 Solitary pulmonary nodule: Secondary | ICD-10-CM

## 2019-06-23 DIAGNOSIS — I69318 Other symptoms and signs involving cognitive functions following cerebral infarction: Secondary | ICD-10-CM

## 2019-06-23 DIAGNOSIS — E041 Nontoxic single thyroid nodule: Secondary | ICD-10-CM

## 2019-06-23 DIAGNOSIS — K5909 Other constipation: Secondary | ICD-10-CM

## 2019-06-23 DIAGNOSIS — Z853 Personal history of malignant neoplasm of breast: Secondary | ICD-10-CM

## 2019-06-23 DIAGNOSIS — F419 Anxiety disorder, unspecified: Secondary | ICD-10-CM

## 2019-06-23 DIAGNOSIS — Z9884 Bariatric surgery status: Secondary | ICD-10-CM

## 2019-06-23 DIAGNOSIS — G4700 Insomnia, unspecified: Secondary | ICD-10-CM

## 2019-06-23 DIAGNOSIS — Z794 Long term (current) use of insulin: Secondary | ICD-10-CM

## 2019-06-23 DIAGNOSIS — Z79891 Long term (current) use of opiate analgesic: Secondary | ICD-10-CM

## 2019-06-23 DIAGNOSIS — G43909 Migraine, unspecified, not intractable, without status migrainosus: Secondary | ICD-10-CM

## 2019-06-23 DIAGNOSIS — J449 Chronic obstructive pulmonary disease, unspecified: Secondary | ICD-10-CM

## 2019-06-23 DIAGNOSIS — R471 Dysarthria and anarthria: Secondary | ICD-10-CM

## 2019-06-23 DIAGNOSIS — E119 Type 2 diabetes mellitus without complications: Secondary | ICD-10-CM

## 2019-06-23 DIAGNOSIS — Z9181 History of falling: Secondary | ICD-10-CM

## 2019-06-23 DIAGNOSIS — Z79899 Other long term (current) drug therapy: Secondary | ICD-10-CM

## 2019-06-23 DIAGNOSIS — I69354 Hemiplegia and hemiparesis following cerebral infarction affecting left non-dominant side: Secondary | ICD-10-CM | POA: Diagnosis not present

## 2019-06-23 DIAGNOSIS — Z7902 Long term (current) use of antithrombotics/antiplatelets: Secondary | ICD-10-CM

## 2019-06-23 DIAGNOSIS — I959 Hypotension, unspecified: Secondary | ICD-10-CM

## 2019-06-24 DIAGNOSIS — E041 Nontoxic single thyroid nodule: Secondary | ICD-10-CM | POA: Diagnosis not present

## 2019-06-24 DIAGNOSIS — I10 Essential (primary) hypertension: Secondary | ICD-10-CM | POA: Diagnosis not present

## 2019-06-24 DIAGNOSIS — K259 Gastric ulcer, unspecified as acute or chronic, without hemorrhage or perforation: Secondary | ICD-10-CM | POA: Diagnosis not present

## 2019-06-24 DIAGNOSIS — I69392 Facial weakness following cerebral infarction: Secondary | ICD-10-CM | POA: Diagnosis not present

## 2019-06-24 DIAGNOSIS — M170 Bilateral primary osteoarthritis of knee: Secondary | ICD-10-CM | POA: Diagnosis not present

## 2019-06-24 DIAGNOSIS — M791 Myalgia, unspecified site: Secondary | ICD-10-CM | POA: Diagnosis not present

## 2019-06-24 DIAGNOSIS — I69354 Hemiplegia and hemiparesis following cerebral infarction affecting left non-dominant side: Secondary | ICD-10-CM | POA: Diagnosis not present

## 2019-06-24 DIAGNOSIS — I69398 Other sequelae of cerebral infarction: Secondary | ICD-10-CM | POA: Diagnosis not present

## 2019-06-24 DIAGNOSIS — J449 Chronic obstructive pulmonary disease, unspecified: Secondary | ICD-10-CM | POA: Diagnosis not present

## 2019-06-24 DIAGNOSIS — G8929 Other chronic pain: Secondary | ICD-10-CM | POA: Diagnosis not present

## 2019-06-24 DIAGNOSIS — G4700 Insomnia, unspecified: Secondary | ICD-10-CM | POA: Diagnosis not present

## 2019-06-24 DIAGNOSIS — I69318 Other symptoms and signs involving cognitive functions following cerebral infarction: Secondary | ICD-10-CM | POA: Diagnosis not present

## 2019-06-24 DIAGNOSIS — E785 Hyperlipidemia, unspecified: Secondary | ICD-10-CM | POA: Diagnosis not present

## 2019-06-24 DIAGNOSIS — M21372 Foot drop, left foot: Secondary | ICD-10-CM | POA: Diagnosis not present

## 2019-06-24 DIAGNOSIS — E119 Type 2 diabetes mellitus without complications: Secondary | ICD-10-CM | POA: Diagnosis not present

## 2019-06-24 DIAGNOSIS — I959 Hypotension, unspecified: Secondary | ICD-10-CM | POA: Diagnosis not present

## 2019-06-24 DIAGNOSIS — M810 Age-related osteoporosis without current pathological fracture: Secondary | ICD-10-CM | POA: Diagnosis not present

## 2019-06-24 DIAGNOSIS — R471 Dysarthria and anarthria: Secondary | ICD-10-CM | POA: Diagnosis not present

## 2019-06-24 DIAGNOSIS — R911 Solitary pulmonary nodule: Secondary | ICD-10-CM | POA: Diagnosis not present

## 2019-06-24 DIAGNOSIS — G43909 Migraine, unspecified, not intractable, without status migrainosus: Secondary | ICD-10-CM | POA: Diagnosis not present

## 2019-06-24 DIAGNOSIS — G9340 Encephalopathy, unspecified: Secondary | ICD-10-CM | POA: Diagnosis not present

## 2019-06-24 DIAGNOSIS — M8580 Other specified disorders of bone density and structure, unspecified site: Secondary | ICD-10-CM | POA: Diagnosis not present

## 2019-06-28 DIAGNOSIS — I63511 Cerebral infarction due to unspecified occlusion or stenosis of right middle cerebral artery: Secondary | ICD-10-CM | POA: Diagnosis not present

## 2019-06-28 DIAGNOSIS — Z853 Personal history of malignant neoplasm of breast: Secondary | ICD-10-CM | POA: Diagnosis not present

## 2019-06-28 DIAGNOSIS — E119 Type 2 diabetes mellitus without complications: Secondary | ICD-10-CM | POA: Diagnosis not present

## 2019-07-01 DIAGNOSIS — E041 Nontoxic single thyroid nodule: Secondary | ICD-10-CM | POA: Diagnosis not present

## 2019-07-01 DIAGNOSIS — G43909 Migraine, unspecified, not intractable, without status migrainosus: Secondary | ICD-10-CM | POA: Diagnosis not present

## 2019-07-01 DIAGNOSIS — I959 Hypotension, unspecified: Secondary | ICD-10-CM | POA: Diagnosis not present

## 2019-07-01 DIAGNOSIS — R471 Dysarthria and anarthria: Secondary | ICD-10-CM | POA: Diagnosis not present

## 2019-07-01 DIAGNOSIS — K259 Gastric ulcer, unspecified as acute or chronic, without hemorrhage or perforation: Secondary | ICD-10-CM | POA: Diagnosis not present

## 2019-07-01 DIAGNOSIS — M170 Bilateral primary osteoarthritis of knee: Secondary | ICD-10-CM | POA: Diagnosis not present

## 2019-07-01 DIAGNOSIS — I69354 Hemiplegia and hemiparesis following cerebral infarction affecting left non-dominant side: Secondary | ICD-10-CM | POA: Diagnosis not present

## 2019-07-01 DIAGNOSIS — M810 Age-related osteoporosis without current pathological fracture: Secondary | ICD-10-CM | POA: Diagnosis not present

## 2019-07-01 DIAGNOSIS — M791 Myalgia, unspecified site: Secondary | ICD-10-CM | POA: Diagnosis not present

## 2019-07-01 DIAGNOSIS — G8929 Other chronic pain: Secondary | ICD-10-CM | POA: Diagnosis not present

## 2019-07-01 DIAGNOSIS — M21372 Foot drop, left foot: Secondary | ICD-10-CM | POA: Diagnosis not present

## 2019-07-01 DIAGNOSIS — J449 Chronic obstructive pulmonary disease, unspecified: Secondary | ICD-10-CM | POA: Diagnosis not present

## 2019-07-01 DIAGNOSIS — E785 Hyperlipidemia, unspecified: Secondary | ICD-10-CM | POA: Diagnosis not present

## 2019-07-01 DIAGNOSIS — I69392 Facial weakness following cerebral infarction: Secondary | ICD-10-CM | POA: Diagnosis not present

## 2019-07-01 DIAGNOSIS — I69318 Other symptoms and signs involving cognitive functions following cerebral infarction: Secondary | ICD-10-CM | POA: Diagnosis not present

## 2019-07-01 DIAGNOSIS — I69398 Other sequelae of cerebral infarction: Secondary | ICD-10-CM | POA: Diagnosis not present

## 2019-07-01 DIAGNOSIS — M8580 Other specified disorders of bone density and structure, unspecified site: Secondary | ICD-10-CM | POA: Diagnosis not present

## 2019-07-01 DIAGNOSIS — I10 Essential (primary) hypertension: Secondary | ICD-10-CM | POA: Diagnosis not present

## 2019-07-01 DIAGNOSIS — E119 Type 2 diabetes mellitus without complications: Secondary | ICD-10-CM | POA: Diagnosis not present

## 2019-07-01 DIAGNOSIS — G4700 Insomnia, unspecified: Secondary | ICD-10-CM | POA: Diagnosis not present

## 2019-07-01 DIAGNOSIS — R911 Solitary pulmonary nodule: Secondary | ICD-10-CM | POA: Diagnosis not present

## 2019-07-01 DIAGNOSIS — G9340 Encephalopathy, unspecified: Secondary | ICD-10-CM | POA: Diagnosis not present

## 2019-07-08 DIAGNOSIS — I69354 Hemiplegia and hemiparesis following cerebral infarction affecting left non-dominant side: Secondary | ICD-10-CM | POA: Diagnosis not present

## 2019-07-29 DIAGNOSIS — E119 Type 2 diabetes mellitus without complications: Secondary | ICD-10-CM | POA: Diagnosis not present

## 2019-07-29 DIAGNOSIS — I63511 Cerebral infarction due to unspecified occlusion or stenosis of right middle cerebral artery: Secondary | ICD-10-CM | POA: Diagnosis not present

## 2019-07-29 DIAGNOSIS — Z853 Personal history of malignant neoplasm of breast: Secondary | ICD-10-CM | POA: Diagnosis not present

## 2019-07-31 ENCOUNTER — Other Ambulatory Visit: Payer: Self-pay | Admitting: Physical Medicine and Rehabilitation

## 2019-07-31 DIAGNOSIS — M545 Low back pain: Secondary | ICD-10-CM | POA: Diagnosis not present

## 2019-07-31 DIAGNOSIS — G894 Chronic pain syndrome: Secondary | ICD-10-CM | POA: Diagnosis not present

## 2019-07-31 DIAGNOSIS — I63511 Cerebral infarction due to unspecified occlusion or stenosis of right middle cerebral artery: Secondary | ICD-10-CM

## 2019-07-31 DIAGNOSIS — Z79891 Long term (current) use of opiate analgesic: Secondary | ICD-10-CM | POA: Diagnosis not present

## 2019-08-03 DIAGNOSIS — E1165 Type 2 diabetes mellitus with hyperglycemia: Secondary | ICD-10-CM | POA: Diagnosis not present

## 2019-08-03 DIAGNOSIS — E1140 Type 2 diabetes mellitus with diabetic neuropathy, unspecified: Secondary | ICD-10-CM | POA: Diagnosis not present

## 2019-08-03 DIAGNOSIS — I69354 Hemiplegia and hemiparesis following cerebral infarction affecting left non-dominant side: Secondary | ICD-10-CM | POA: Diagnosis not present

## 2019-08-03 DIAGNOSIS — I10 Essential (primary) hypertension: Secondary | ICD-10-CM | POA: Diagnosis not present

## 2019-08-07 DIAGNOSIS — M7502 Adhesive capsulitis of left shoulder: Secondary | ICD-10-CM | POA: Diagnosis not present

## 2019-08-07 DIAGNOSIS — M25512 Pain in left shoulder: Secondary | ICD-10-CM | POA: Diagnosis not present

## 2019-08-11 DIAGNOSIS — M545 Low back pain: Secondary | ICD-10-CM | POA: Diagnosis not present

## 2019-08-11 DIAGNOSIS — Z79899 Other long term (current) drug therapy: Secondary | ICD-10-CM | POA: Diagnosis not present

## 2019-09-08 ENCOUNTER — Other Ambulatory Visit (HOSPITAL_COMMUNITY): Payer: Self-pay | Admitting: Interventional Radiology

## 2019-09-08 DIAGNOSIS — I639 Cerebral infarction, unspecified: Secondary | ICD-10-CM

## 2019-09-08 DIAGNOSIS — I771 Stricture of artery: Secondary | ICD-10-CM

## 2019-09-18 ENCOUNTER — Ambulatory Visit (HOSPITAL_COMMUNITY)
Admission: RE | Admit: 2019-09-18 | Discharge: 2019-09-18 | Disposition: A | Discharge status: Home / Self Care | Payer: Medicare Other | Source: Ambulatory Visit | Attending: Interventional Radiology | Admitting: Interventional Radiology

## 2019-09-18 ENCOUNTER — Other Ambulatory Visit: Payer: Self-pay

## 2019-09-18 DIAGNOSIS — I771 Stricture of artery: Secondary | ICD-10-CM | POA: Diagnosis not present

## 2019-09-18 DIAGNOSIS — I639 Cerebral infarction, unspecified: Secondary | ICD-10-CM | POA: Diagnosis present

## 2019-09-20 NOTE — Progress Notes (Deleted)
{Choose 1 Note Type (Video or Telephone):[phone]}   The patient was identified using 2 identifiers.  Date:  09/20/2019   ID:  Ann Lopez, DOB May 20, 1970, MRN [id number]  {Patient Location:[id number]::"Home"} {Provider Location:[id number]::"Home"}  PCP:  Hayden Rasmussen, MD  Cardiologist:  Evalina Field, MD *** Electrophysiologist:  None   Evaluation Performed:  {Choose Visit Type:[phone]::"Follow-Up Visit"}  Chief Complaint:  ***  History of Present Illness:    Ann Lopez is a 49 y.o. female with prior CVA, DM, HTG, tobacco abuse who presents for follow-up. Main issue has been HTG. We have her on crestor and vascepa. Most recent TG best they have ever been 245.   Problem List 1. R MCA CVA 2/2 R ICA stenosis s/p stent placement 03/02/2019 2. Diabetes (A1c 9.0) 3. Hypertriglyceridemia (T chol 144, HDL 40, LDL 65, TG 245) 4. Tobacco Abuse  The patient {does/does not:200015} have symptoms concerning for COVID-19 infection (fever, chills, cough, or new shortness of breath).    Past Medical History:  Diagnosis Date  . Allergy   . Anxiety   . Asthma   . Back pain   . Breast cancer ([place])   . Cancer ([place])   . Depression   . Diabetes mellitus   . Headache   . Hyperlipemia   . Osteopenia   . Osteoporosis    osteopenia  . Stroke [facility])    Past Surgical History:  Procedure Laterality Date  . ABDOMINAL HYSTERECTOMY     B oophorectomy for BRCA1 gene  . BREAST SURGERY    . CESAREAN SECTION    . INCISION AND DRAINAGE ABSCESS Left 11/14/2012   Procedure: INCISION AND DRAINAGE ABSCESS LEFT GROIN;  Surgeon: Jamesetta So, MD;  Location: [facility];  Service: General;  Laterality: Left;  . IR ANGIO VERTEBRAL SEL SUBCLAVIAN INNOMINATE UNI R MOD SED  03/02/2019  . IR CT HEAD LTD  03/02/2019  . IR INTRAVSC STENT CERV CAROTID W/O EMB-PROT MOD SED INC ANGIO  03/02/2019  . IR PERCUTANEOUS ART THROMBECTOMY/INFUSION INTRACRANIAL INC DIAG ANGIO  03/02/2019  . LYMPHADENECTOMY     . MASTECTOMY Bilateral   . RADIOLOGY WITH ANESTHESIA N/A 03/02/2019   Procedure: IR WITH ANESTHESIA;  Surgeon: Luanne Bras, MD;  Location: [place];  Service: Radiology;  Laterality: N/A;     No outpatient medications have been marked as taking for the 09/22/19 encounter (Appointment) with Geralynn Rile, MD.     Allergies:   Azithromycin, Morphine and related, Nitrofurantoin, Penicillins, and Sulfonamide derivatives   Social History   Tobacco Use  . Smoking status: Former Smoker    Packs/day: 0.50    Types: Cigarettes    Quit date: 03/02/2019    Years since quitting: 0.5  . Smokeless tobacco: Never Used  Substance Use Topics  . Alcohol use: No    Alcohol/week: 0.0 standard drinks  . Drug use: No     Family Hx: The patient's family history includes Cancer in her sister; Cancer (age of onset: 5) in her mother; Cancer (age of onset: 80) in her maternal grandfather and maternal grandmother; Hypertension in her sister; Hypothyroidism in her brother and sister.  ROS:   Please see the history of present illness.    *** All other systems reviewed and are negative.   Prior CV studies:   The following studies were reviewed today:  ***  Labs/Other Tests and Data Reviewed:    EKG:  {EKG/Telemetry Strips Reviewed:[phone]}  Recent Labs: 03/07/2019: Magnesium 1.9 05/03/2019: ALT  33; BUN 10; Creatinine, Ser 0.90; Hemoglobin 15.3; Platelets 297; Potassium 3.2; Sodium 140   Recent Lipid Panel Lab Results  Component Value Date/Time   CHOL 144 06/18/2019 10:46 AM   TRIG 245 (H) 06/18/2019 10:46 AM   HDL 40 06/18/2019 10:46 AM   CHOLHDL 3.6 06/18/2019 10:46 AM   CHOLHDL 7.2 03/03/2019 04:46 AM   LDLCALC 64 06/18/2019 10:46 AM   LDLDIRECT 65 06/18/2019 10:46 AM   LDLDIRECT 39.4 03/04/2019 12:50 PM    Wt Readings from Last 3 Encounters:  06/16/19 169 lb (76.7 kg)  05/18/19 178 lb 12.8 oz (81.1 kg)  05/08/19 169 lb (76.7 kg)     Objective:    Vital Signs:   There were no vitals taken for this visit.   {[facility] Virtual Exam (Optional):[phone]::"VITAL SIGNS:  reviewed"}  ASSESSMENT & PLAN:    1. ***  COVID-19 Education: The signs and symptoms of COVID-19 were discussed with the patient and how to seek care for testing (follow up with PCP or arrange E-visit).  ***The importance of social distancing was discussed today.  Time:   Today, I have spent *** minutes with the patient with telehealth technology discussing the above problems.     Medication Adjustments/Labs and Tests Ordered: Current medicines are reviewed at length with the patient today.  Concerns regarding medicines are outlined above.   Tests Ordered: No orders of the defined types were placed in this encounter.   Medication Changes: No orders of the defined types were placed in this encounter.   Follow Up:  {F/U Format:[id number]} {follow up:15908}  Signed, Evalina Field, MD  09/20/2019 3:09 PM    [facility]

## 2019-09-21 ENCOUNTER — Telehealth (HOSPITAL_COMMUNITY): Payer: Self-pay

## 2019-09-21 NOTE — Telephone Encounter (Signed)
Called pt regarding recent us carotid, no answer, left vm. AW 

## 2019-09-22 ENCOUNTER — Telehealth (HOSPITAL_COMMUNITY): Payer: Self-pay

## 2019-09-22 ENCOUNTER — Telehealth: Payer: Medicare Other | Admitting: Cardiovascular Disease

## 2019-09-22 NOTE — Telephone Encounter (Signed)
Pt agreed to f/u in 6 months with us carotid. AW 

## 2019-09-24 ENCOUNTER — Telehealth: Payer: Self-pay | Admitting: Physical Therapy

## 2019-09-24 ENCOUNTER — Ambulatory Visit: Payer: Medicare Other | Attending: Physical Medicine and Rehabilitation | Admitting: Physical Therapy

## 2019-09-24 ENCOUNTER — Other Ambulatory Visit: Payer: Self-pay

## 2019-09-24 ENCOUNTER — Other Ambulatory Visit: Payer: Self-pay | Admitting: Physical Medicine and Rehabilitation

## 2019-09-24 DIAGNOSIS — R2689 Other abnormalities of gait and mobility: Secondary | ICD-10-CM | POA: Diagnosis not present

## 2019-09-24 DIAGNOSIS — R29818 Other symptoms and signs involving the nervous system: Secondary | ICD-10-CM | POA: Diagnosis present

## 2019-09-24 DIAGNOSIS — M6281 Muscle weakness (generalized): Secondary | ICD-10-CM | POA: Diagnosis present

## 2019-09-24 DIAGNOSIS — I69354 Hemiplegia and hemiparesis following cerebral infarction affecting left non-dominant side: Secondary | ICD-10-CM | POA: Diagnosis present

## 2019-09-24 DIAGNOSIS — M25512 Pain in left shoulder: Secondary | ICD-10-CM | POA: Diagnosis present

## 2019-09-24 DIAGNOSIS — R293 Abnormal posture: Secondary | ICD-10-CM | POA: Diagnosis present

## 2019-09-24 DIAGNOSIS — R278 Other lack of coordination: Secondary | ICD-10-CM | POA: Insufficient documentation

## 2019-09-24 DIAGNOSIS — I63511 Cerebral infarction due to unspecified occlusion or stenosis of right middle cerebral artery: Secondary | ICD-10-CM

## 2019-09-24 DIAGNOSIS — R2681 Unsteadiness on feet: Secondary | ICD-10-CM | POA: Diagnosis present

## 2019-09-24 DIAGNOSIS — R29898 Other symptoms and signs involving the musculoskeletal system: Secondary | ICD-10-CM | POA: Insufficient documentation

## 2019-09-24 NOTE — Therapy (Signed)
[facility] [address] [place], [place], [address] Phone: [phone]   Fax:  [phone]  Physical Therapy Evaluation  Patient Details  Name: Ann Lopez MRN: [id number] Date of Birth: 1970-08-09 Referring Provider (PT): Dr. Leeroy Cha   Encounter Date: 09/24/2019   PT End of Session - 09/24/19 1345    Visit Number 1    Number of Visits 26    Date for PT Re-Evaluation 12/23/19    Authorization Type UHC Medicare/Medicaid    Authorization Time Period 09/24/2019-12/23/2019    Progress Note Due on Visit 10    PT Start Time 0932    PT Stop Time 1016    PT Time Calculation (min) 44 min    Activity Tolerance Patient tolerated treatment well;Patient limited by pain   Pt reports pain in L low back with increased gait   Behavior During Therapy [facility] for tasks assessed/performed           Past Medical History:  Diagnosis Date  . Allergy   . Anxiety   . Asthma   . Back pain   . Breast cancer ([place])   . Cancer ([place])   . Depression   . Diabetes mellitus   . Headache   . Hyperlipemia   . Osteopenia   . Osteoporosis    osteopenia  . Stroke [facility])     Past Surgical History:  Procedure Laterality Date  . ABDOMINAL HYSTERECTOMY     B oophorectomy for BRCA1 gene  . BREAST SURGERY    . CESAREAN SECTION    . INCISION AND DRAINAGE ABSCESS Left 11/14/2012   Procedure: INCISION AND DRAINAGE ABSCESS LEFT GROIN;  Surgeon: Jamesetta So, MD;  Location: [facility];  Service: General;  Laterality: Left;  . IR ANGIO VERTEBRAL SEL SUBCLAVIAN INNOMINATE UNI R MOD SED  03/02/2019  . IR CT HEAD LTD  03/02/2019  . IR INTRAVSC STENT CERV CAROTID W/O EMB-PROT MOD SED INC ANGIO  03/02/2019  . IR PERCUTANEOUS ART THROMBECTOMY/INFUSION INTRACRANIAL INC DIAG ANGIO  03/02/2019  . LYMPHADENECTOMY    . MASTECTOMY Bilateral   . RADIOLOGY WITH ANESTHESIA N/A 03/02/2019   Procedure: IR WITH ANESTHESIA;  Surgeon: Luanne Bras, MD;   Location: [facility];  Service: Radiology;  Laterality: N/A;    There were no vitals filed for this visit.    Subjective Assessment - 09/24/19 0938    Subjective Had CVA affecting L side in November 2020.  I'm head strong and want to get all the movement back in my L side.  Went to [facility] [place] for my L shoulder, because my shoulder was hurting so bad.  Want to get rid of the limp on L side.  Was w/c bound when I got out of the hospital; now not using anything.  Had 1 fall getting out of the car, twisting my ankle.    Pertinent History [id number], hyperlipidemia, smoking, breast cancer, R carotid stenosis requiring emergent thrombectomy    Patient Stated Goals Pt wants all the movement back in L side.    Currently in Pain? No/denies              [facility] PT Assessment - 09/24/19 0944      Assessment   Medical Diagnosis Ischemic R MCA CVA 02/2019    Referring Provider (PT) Dr. Leeroy Cha    Onset Date/Surgical Date 03/02/19   MD 07/2018   Hand Dominance Right    Prior Therapy CIR 21 days; HHPT through March  Precautions   Precautions Fall    Precaution Comments spasticity LUE and LLE; pain with significant L shoulder pain      Balance Screen   Has the patient fallen in the past 6 months Yes    How many times? 1    Has the patient had a decrease in activity level because of a fear of falling?  No    Is the patient reluctant to leave their home because of a fear of falling?  No      Home Environment   Living Environment Private residence    Living Arrangements Children   45 yo son   Available Help at Discharge Family    Type of [place] to enter    Entrance Stairs-Number of Steps 3    Entrance Stairs-Rails Right;Left;Cannot reach both    [place] One level    Home Equipment Wheelchair - [employer] - 2 wheels;Cane - quad      Prior Function   Level of Independence Independent    Vocation On disability    Leisure Travelling with son to  soccer tournaments, took care of grandchild (56 yo)      Observation/Other Assessments   Focus on Therapeutic Outcomes (FOTO)  FOTO intake score:  40; pt reports walking one block limited a lot      Sensation   Light Touch Appears Intact    Proprioception Appears Intact   to initial testing; pt does report rolling L ankle 1 mon ago     Coordination   Gross Motor Movements are Fluid and Coordinated No   Spasticity LUE and LLE     Posture/Postural Control   Posture/Postural Control Postural limitations    Postural Limitations Rounded Shoulders      Tone   Assessment Location Left Lower Extremity      ROM / Strength   AROM / PROM / Strength Strength      Strength   Overall Strength Deficits    Strength Assessment Site Knee;Ankle;Hip    Right/Left Hip Right;Left    Right Hip Flexion 4/5    Left Hip Flexion 3+/5    Left Hip Extension 3/5    Left Hip ABduction 3/5    Right/Left Knee Right;Left    Right Knee Flexion 5/5    Right Knee Extension 5/5    Left Knee Flexion 4/5    Left Knee Extension 4/5    Right/Left Ankle Right;Left    Right Ankle Dorsiflexion 5/5    Left Ankle Dorsiflexion 4/5    Left Ankle Inversion 5/5    Left Ankle Eversion 4/5      Transfers   Transfers Sit to Stand;Stand to Sit    Sit to Stand 6: Modified independent (Device/Increase time);Without upper extremity assist;From chair/3-in-1    Five time sit to stand comments  14.19    Stand to Sit 6: Modified independent (Device/Increase time);Without upper extremity assist;To chair/3-in-1      Ambulation/Gait   Ambulation/Gait Yes    Ambulation/Gait Assistance 6: Modified independent (Device/Increase time)    Ambulation Distance (Feet) 200 Feet    Assistive device None    Gait Pattern Step-through pattern;Decreased arm swing - left;Decreased stance time - left;Decreased step length - right;Decreased hip/knee flexion - left;Decreased dorsiflexion - left;Decreased weight shift to left;Decreased trunk  rotation;Trendelenburg    Ambulation Surface Level;Indoor    Gait velocity 11.63 sec = 2.82 ft/sec      Standardized Balance Assessment  Standardized Balance Assessment Timed Up and Go Test      Timed Up and Go Test   Normal TUG (seconds) 11.34    TUG Comments Scores >13.5 seconds indicate increased fall risk      Functional Gait  Assessment   Gait assessed  Yes    Gait Level Surface Walks 20 ft in less than 7 sec but greater than 5.5 sec, uses assistive device, slower speed, mild gait deviations, or deviates 6-10 in outside of the 12 in walkway width.   6.87   Change in Gait Speed Able to change speed, demonstrates mild gait deviations, deviates 6-10 in outside of the 12 in walkway width, or no gait deviations, unable to achieve a major change in velocity, or uses a change in velocity, or uses an assistive device.    Gait with Horizontal Head Turns Performs head turns smoothly with slight change in gait velocity (eg, minor disruption to smooth gait path), deviates 6-10 in outside 12 in walkway width, or uses an assistive device.    Gait with Vertical Head Turns Performs task with slight change in gait velocity (eg, minor disruption to smooth gait path), deviates 6 - 10 in outside 12 in walkway width or uses assistive device    Gait and Pivot Turn Pivot turns safely in greater than 3 sec and stops with no loss of balance, or pivot turns safely within 3 sec and stops with mild imbalance, requires small steps to catch balance.    Step Over Obstacle Is able to step over one shoe box (4.5 in total height) but must slow down and adjust steps to clear box safely. May require verbal cueing.    Gait with Narrow Base of Support Ambulates 4-7 steps.    Gait with Eyes Closed Walks 20 ft, slow speed, abnormal gait pattern, evidence for imbalance, deviates 10-15 in outside 12 in walkway width. Requires more than 9 sec to ambulate 20 ft.    Ambulating Backwards Walks 20 ft, slow speed, abnormal gait pattern,  evidence for imbalance, deviates 10-15 in outside 12 in walkway width.   21.03   Steps Alternating feet, no rail.    Total Score 17    FGA comment: Scores <22/30 indicate increased fall risk      LLE Tone   LLE Tone Mild                      Objective measurements completed on examination: See above findings.               PT Education - 09/24/19 1344    Education Details Eval results, POC, request for OT eval (pt agrees)    Person(s) Educated Patient    Methods Explanation    Comprehension Verbalized understanding            PT Short Term Goals - 09/24/19 1448      PT SHORT TERM GOAL #1   Title Pt will be independent with HEP for improved balance, strength, gait.  TARGET 5 weeks for all LTGs: 10/23/2019    Time 5    Period Weeks    Status New      PT SHORT TERM GOAL #2   Title Pt will improve 5x sit<>stand score to less than or equal to 12 seconds for improved functional strength.    Time 5    Period Weeks    Status New      PT SHORT TERM GOAL #3  Title Pt will improve FGA score to at least 20/30 for decreased fall risk.    Time 5    Period Weeks    Status New      PT SHORT TERM GOAL #4   Title Pt will verbalize understanding of fall prevention in home environment.    Time 5    Period Weeks    Status New             PT Long Term Goals - 09/24/19 1451      PT LONG TERM GOAL #1   Title Pt will be independent with progression of HEP for strength, balance, and gait.  TARGET 11/20/2019    Time 9    Period Weeks    Status New      PT LONG TERM GOAL #2   Title Pt will improve 5x sit<>stand score to less than or equal to 10 seconds for improved functional strength.    Time 9    Period Weeks    Status New      PT LONG TERM GOAL #3   Title Pt will improve Functional Gait Assessment score to at least 22/30 for decreased fall risk.    Time 9    Period Weeks    Status New      PT LONG TERM GOAL #4   Title Pt will improve gait  velocity to at least 3 ft/sec for improved gait efficiency for community ambulation.    Time 9    Period Weeks    Status New      PT LONG TERM GOAL #5   Title Pt will ambulate at least 1000 ft, outdoor and indoor surfaces, appropriate device as needed, modified independently, for improved community gait.    Time 9    Period Weeks    Status New      Additional Long Term Goals   Additional Long Term Goals Yes      PT LONG TERM GOAL #6   Title FOTO score to improve by at least 10 % for improved overall reported functional mobility.    Time 9    Period Weeks    Status New                  Plan - 09/24/19 1347    Clinical Impression Statement Pt is a 49 year old female who presents to OPPT with history of CVA and L-sided weakness and spasticity November 2020.  She had inpatient rehab x 3 weeks, then home health therapies.  She presents today with LLE weakness, decreased coordiantion of LUE due to spasticity and weakness (causing hx of L shoulder pain), decreased balance, decreased timing and coordination of gait, abnormal posture.  She has had one fall; she is at risk for falls per FGA score of 17/30.  Prior to CVA, she was independent and going to son's travel soccer games, caring for 80 yo granddaughter.  She would benefit from skilled PT to address the above stated deficits to decrease fall risk and improve overall functional mobility.    Personal Factors and Comorbidities Comorbidity 3+    Comorbidities PMH:  diabetes, hyperlipidemia, smoking, breast cancer, R carotid stenosis requiring emergent thrombectomy, anxiety depression    Examination-Activity Limitations Locomotion Level;Transfers;Reach Overhead;Stand;Stairs;Lift;Dressing    Examination-Participation Restrictions Community Activity;Other   caring for grandchild   Stability/Clinical Decision Making Evolving/Moderate complexity    Clinical Decision Making Moderate    Rehab Potential Good    PT Frequency Other (comment)  1x/wk for 1 week, then 3x/wk for 8 weeks   PT Duration Other (comment)   9 weeks   PT Treatment/Interventions ADLs/Self Care Home Management;Electrical Stimulation;DME Instruction;Gait training;Stair training;Functional mobility training;Therapeutic activities;Therapeutic exercise;Balance training;Neuromuscular re-education;Manual techniques;Orthotic Fit/Training;Patient/family education;Passive range of motion    PT Next Visit Plan Initiate HEP for LLE strengthening, weightbearing, balance; assess pt's previous (HHHPT) HEP; gait training for improved timing/coordination of gait.    Recommended Other Services Occupational therapy to address LUE post CVA/spasticity    Consulted and Agree with Plan of Care Patient           Patient will benefit from skilled therapeutic intervention in order to improve the following deficits and impairments:  Abnormal gait, Decreased coordination, Difficulty walking, Impaired tone, Impaired UE functional use, Decreased activity tolerance, Decreased balance, Decreased mobility, Decreased strength, Postural dysfunction  Visit Diagnosis: Other abnormalities of gait and mobility  Muscle weakness (generalized)  Unsteadiness on feet  Abnormal posture     Problem List Patient Active Problem List   Diagnosis Date Noted  . Vascular headache   . Other chronic pain   . Uncontrolled type 2 diabetes mellitus with hyperglycemia ([place])   . Labile blood glucose   . Panic disorder with agoraphobia and moderate panic attacks   . Major depressive disorder, recurrent episode, moderate ([place])   . MCI (mild cognitive impairment) with memory loss   . Acute ischemic right middle cerebral artery (MCA) stroke ([place]) 03/09/2019  . Left hemiparesis ([place])   . Bradycardia   . Tachypnea   . SIRS (systemic inflammatory response syndrome) ([facility])   . Hypokalemia   . Diabetes mellitus type 2 in obese ([place])   . Tobacco abuse   . History of breast cancer   . Cognitive deficits   .  Dysphagia, post-stroke   . Stroke (cerebrum) ([place]) 03/02/2019  . Middle cerebral artery embolism, right 03/02/2019  . Chest pain 12/19/2015  . Numbness 12/19/2015  . Femoral hernia 03/24/2014  . Dyslipidemia 09/10/2011  . BRCA1 positive 09/10/2011  . Breast cancer ([place]) 09/10/2011  . H/O gastric bypass; 05/14/11([facility]) 09/10/2011  . Essential hypertension 10/28/2008  . DEPRESSION/ANXIETY 02/19/2007  . CARPAL TUNNEL SYNDROME, RIGHT 02/19/2007  . ALLERGIC RHINITIS 02/19/2007  . GERD 02/19/2007  . IRRITABLE BOWEL SYNDROME 02/19/2007  . HERPES GENITALIS 12/30/2006  . Diabetes ([place]) 12/30/2006  . OSTEOPENIA 12/30/2006  . ANOMALY, CONGENITAL, NERVOUS SYSTEM NEC 12/30/2006    Dawanda Mapel W. 09/24/2019, 3:46 PM  Frazier Butt., PT   [facility] [address] [place], [place], [address] Phone: [phone]   Fax:  [phone]  Name: Ann Lopez MRN: [id number] Date of Birth: March 13, 1971

## 2019-09-24 NOTE — Telephone Encounter (Signed)
Dr. Ranell Patrick, I evaluated Ann Lopez today for PT, and will plan to address strength, balance, and gait training (cert was sent to you with additional details).  She is having significant L shoulder pain and demonstrates LUE spasticity and synergy pattern with gait.  She would benefit from occupational therapy evaluation to address LUE pain and functional use.    If you agree, could you please send OT referral via Epic?  Thank you.  Mady Haagensen, PT 09/24/19 3:52 PM Phone: [phone] Fax: [phone]

## 2019-09-24 NOTE — Telephone Encounter (Signed)
Absolutely, thank you!

## 2019-09-25 ENCOUNTER — Ambulatory Visit: Payer: Medicare Other | Admitting: Occupational Therapy

## 2019-09-25 ENCOUNTER — Encounter: Payer: Self-pay | Admitting: Occupational Therapy

## 2019-09-25 ENCOUNTER — Ambulatory Visit: Payer: Medicare Other | Admitting: Physical Therapy

## 2019-09-25 DIAGNOSIS — R2689 Other abnormalities of gait and mobility: Secondary | ICD-10-CM | POA: Diagnosis not present

## 2019-09-25 DIAGNOSIS — R29898 Other symptoms and signs involving the musculoskeletal system: Secondary | ICD-10-CM

## 2019-09-25 DIAGNOSIS — I69354 Hemiplegia and hemiparesis following cerebral infarction affecting left non-dominant side: Secondary | ICD-10-CM

## 2019-09-25 DIAGNOSIS — R278 Other lack of coordination: Secondary | ICD-10-CM

## 2019-09-25 DIAGNOSIS — M6281 Muscle weakness (generalized): Secondary | ICD-10-CM

## 2019-09-25 DIAGNOSIS — M25512 Pain in left shoulder: Secondary | ICD-10-CM

## 2019-09-25 DIAGNOSIS — R29818 Other symptoms and signs involving the nervous system: Secondary | ICD-10-CM

## 2019-09-25 NOTE — Patient Instructions (Signed)
     Overhead Arm Extension - Supine     Lay down.  Hold a foam noodle (Little wider than shoulder-width sized) on tops of legs with arms straight and hands flat on either side. Slowly move arms up/back in an arc with elbows straight and then slowly lower back down.  Only go as far as you can without pain. Keep shoulders away from ears (shoulder blades back and down). Repeat 10 times.    Rest, then do 10 more, 2x/day      NO WEIGHTS!!!   TRY NOT TO LET LEFT SHOULDER TURN IN WHEN LYING DOWN (USE PILLOW TO SUPPORT), KEEP thumb up.

## 2019-09-25 NOTE — Therapy (Signed)
[facility] [address] [place], [place], [address] Phone: [phone]   Fax:  [phone]  Physical Therapy Treatment  Patient Details  Name: Ann Lopez MRN: [id number] Date of Birth: [birth year] Referring Provider (PT): Dr. Leeroy Cha   Encounter Date: 09/25/2019   PT End of Session - 09/25/19 0847    Visit Number 2    Number of Visits 26    Date for PT Re-Evaluation 12/23/19    Authorization Type [facility] Medicare/Medicaid    Authorization Time Period 09/24/2019-12/23/2019    Progress Note Due on Visit 10    PT Start Time 0804    PT Stop Time 0845    PT Time Calculation (min) 41 min    Activity Tolerance Patient tolerated treatment well;Other (comment)   No increase in pain today during session   Behavior During Therapy [facility] for tasks assessed/performed           Past Medical History:  Diagnosis Date  . Allergy   . Anxiety   . Asthma   . Back pain   . Breast cancer ([facility])   . Cancer ([place])   . Depression   . Diabetes mellitus   . Headache   . Hyperlipemia   . Osteopenia   . Osteoporosis    osteopenia  . Stroke [facility])     Past Surgical History:  Procedure Laterality Date  . ABDOMINAL HYSTERECTOMY     B oophorectomy for BRCA1 gene  . BREAST SURGERY    . CESAREAN SECTION    . INCISION AND DRAINAGE ABSCESS Left 11/14/2012   Procedure: INCISION AND DRAINAGE ABSCESS LEFT GROIN;  Surgeon: Jamesetta So, MD;  Location: [facility];  Service: General;  Laterality: Left;  . IR ANGIO VERTEBRAL SEL SUBCLAVIAN INNOMINATE UNI R MOD SED  03/02/2019  . IR CT HEAD LTD  03/02/2019  . IR INTRAVSC STENT CERV CAROTID W/O EMB-PROT MOD SED INC ANGIO  03/02/2019  . IR PERCUTANEOUS ART THROMBECTOMY/INFUSION INTRACRANIAL INC DIAG ANGIO  03/02/2019  . LYMPHADENECTOMY    . MASTECTOMY Bilateral   . RADIOLOGY WITH ANESTHESIA N/A 03/02/2019   Procedure: IR WITH ANESTHESIA;  Surgeon: Luanne Bras, MD;  Location: [facility];   Service: Radiology;  Laterality: N/A;    There were no vitals filed for this visit.   Subjective Assessment - 09/25/19 0806    Subjective Had CVA affecting L side in November 2020.  I'm head strong and want to get all the movement back in my L side.  Went to [facility] for my L shoulder, because my shoulder was hurting so bad.  Want to get rid of the limp on L side.  Was w/c bound when I got out of the hospital; now not using anything.  Had 1 fall getting out of the car, twisting my ankle.    Pertinent History GNO:IBBCWUGQ, hyperlipidemia, smoking, breast cancer, R carotid stenosis requiring emergent thrombectomy    Patient Stated Goals Pt wants all the movement back in L side.    Currently in Pain? Yes    Pain Score 4     Pain Location Shoulder    Pain Orientation Left    Pain Descriptors / Indicators Sharp    Pain Type Acute pain    Pain Onset Yesterday    Pain Frequency Constant    Aggravating Factors  bent down to pick up something yesterday; reaching overhead bothers    Pain Relieving Factors heat helps  [facility] PT Treatment/Exercise - 09/25/19 0818      Transfers   Transfers Sit to Stand;Stand to Sit    Sit to Stand 6: Modified independent (Device/Increase time);Without upper extremity assist;From bed    Stand to Sit 6: Modified independent (Device/Increase time);Without upper extremity assist;To bed    Comments Sit<>stand with LLE tucked posteriorly for improved LLE weightshifting, x 5 reps      Self-Care   Self-Care Other Self-Care Comments    Other Self-Care Comments  Discussed again benefits of OT, follow-up on OT scheduling (pt is agreeable).  Discussed again premise of movement patterns, and reasoning behind exercises given today as part of HEP.  Discussed trying to return to normal movement patterns      Exercises   Exercises Knee/Hip;Lumbar;Ankle      Lumbar Exercises: Stretches   Lower Trunk Rotation 3 reps;30  seconds    Lower Trunk Rotation Limitations Initiated with lumbar trunk rotation rocking side to side, then eased into stretch.  Cues for LUE positioning.      Lumbar Exercises: Seated   Other Seated Lumbar Exercises Seated pelvic tilts x 10 reps with initial facilitation; seated lateral pelvic tilts/weightshifting with facilitation through L trunk      Lumbar Exercises: Supine   Pelvic Tilt 10 reps   Cues for facilitation for technique     Knee/Hip Exercises: Seated   Marching Strengthening;Right;Left;1 set;10 reps    Marching Limitations Cues for abdominal activation throughout exercise.      Knee/Hip Exercises: Supine   Bridges Strengthening;Both;1 set;10 reps    Bridges Limitations Cues for technique and hold time    Other Supine Knee/Hip Exercises Hooklying hip abduction with red theraband resistance, 10 reps each side, with one leg stabilizing.  Cues for technique and control through L hip.      Ankle Exercises: Seated   Other Seated Ankle Exercises Resisted L ankle dorsiflexion x 10 reps with red theraband, cues for neutral positioning to avoid inversion.  Worked on resisted ankle eversion LLE, x 5 reps with red theraband, then removed resistance and performed active LLE ankle eversion x 10 reps.  Cues for 3 second hold and to come back just to neutral (avoid going into L ankle inversion with return to midline)                  PT Education - 09/25/19 1416    Education Details HEP initiated-see instructions    Person(s) Educated Patient    Methods Explanation;Demonstration;Handout;Verbal cues    Comprehension Verbalized understanding;Returned demonstration;Verbal cues required;Need further instruction            PT Short Term Goals - 09/24/19 1448      PT SHORT TERM GOAL #1   Title Pt will be independent with HEP for improved balance, strength, gait.  TARGET 5 weeks for all LTGs: 10/23/2019    Time 5    Period Weeks    Status New      PT SHORT TERM GOAL #2    Title Pt will improve 5x sit<>stand score to less than or equal to 12 seconds for improved functional strength.    Time 5    Period Weeks    Status New      PT SHORT TERM GOAL #3   Title Pt will improve FGA score to at least 20/30 for decreased fall risk.    Time 5    Period Weeks    Status New      PT  SHORT TERM GOAL #4   Title Pt will verbalize understanding of fall prevention in home environment.    Time 5    Period Weeks    Status New             PT Long Term Goals - 09/24/19 1451      PT LONG TERM GOAL #1   Title Pt will be independent with progression of HEP for strength, balance, and gait.  TARGET 11/20/2019    Time 9    Period Weeks    Status New      PT LONG TERM GOAL #2   Title Pt will improve 5x sit<>stand score to less than or equal to 10 seconds for improved functional strength.    Time 9    Period Weeks    Status New      PT LONG TERM GOAL #3   Title Pt will improve Functional Gait Assessment score to at least 22/30 for decreased fall risk.    Time 9    Period Weeks    Status New      PT LONG TERM GOAL #4   Title Pt will improve gait velocity to at least 3 ft/sec for improved gait efficiency for community ambulation.    Time 9    Period Weeks    Status New      PT LONG TERM GOAL #5   Title Pt will ambulate at least 1000 ft, outdoor and indoor surfaces, appropriate device as needed, modified independently, for improved community gait.    Time 9    Period Weeks    Status New      Additional Long Term Goals   Additional Long Term Goals Yes      PT LONG TERM GOAL #6   Title FOTO score to improve by at least 10 % for improved overall reported functional mobility.    Time 9    Period Weeks    Status New                 Plan - 09/25/19 1417    Clinical Impression Statement Initiated HEP to address trunk flexibility and LLE strength.  Pt requires cues throughout for propper technique and for rationale for exercises chosen for optimal  movement patterns.  Pt was able to be fit into OT eval directly after PT.  Will conitnue to benefit from skilled PT to further address balance, strength, and gait for improved overall mobility.    Personal Factors and Comorbidities Comorbidity 3+    Comorbidities PMH:  diabetes, hyperlipidemia, smoking, breast cancer, R carotid stenosis requiring emergent thrombectomy, anxiety depression    Examination-Activity Limitations Locomotion Level;Transfers;Reach Overhead;Stand;Stairs;Lift;Dressing    Examination-Participation Restrictions Community Activity;Other   caring for grandchild   Stability/Clinical Decision Making Evolving/Moderate complexity    Rehab Potential Good    PT Frequency Other (comment)   1x/wk for 1 week, then 3x/wk for 8 weeks   PT Duration Other (comment)   9 weeks   PT Treatment/Interventions ADLs/Self Care Home Management;Electrical Stimulation;DME Instruction;Gait training;Stair training;Functional mobility training;Therapeutic activities;Therapeutic exercise;Balance training;Neuromuscular re-education;Manual techniques;Orthotic Fit/Training;Patient/family education;Passive range of motion    PT Next Visit Plan Review HEP given 09/25/2019 and continue to add for strengthening; LLE weightbearing, trunk control and balance; assess pt's previous ([facility]HPT) HEP; gait training for improved timing/coordination of gait.    Consulted and Agree with Plan of Care Patient           Patient will benefit from skilled therapeutic intervention in  order to improve the following deficits and impairments:  Abnormal gait, Decreased coordination, Difficulty walking, Impaired tone, Impaired UE functional use, Decreased activity tolerance, Decreased balance, Decreased mobility, Decreased strength, Postural dysfunction  Visit Diagnosis: Muscle weakness (generalized)     Problem List Patient Active Problem List   Diagnosis Date Noted  . Vascular headache   . Other chronic pain   . Uncontrolled  type 2 diabetes mellitus with hyperglycemia ([facility])   . Labile blood glucose   . Panic disorder with agoraphobia and moderate panic attacks   . Major depressive disorder, recurrent episode, moderate ([place])   . MCI (mild cognitive impairment) with memory loss   . Acute ischemic right middle cerebral artery (MCA) stroke ([place]) 03/09/2019  . Left hemiparesis ([facility])   . Bradycardia   . Tachypnea   . SIRS (systemic inflammatory response syndrome) ([facility])   . Hypokalemia   . Diabetes mellitus type 2 in obese ([place])   . Tobacco abuse   . History of breast cancer   . Cognitive deficits   . Dysphagia, post-stroke   . Stroke (cerebrum) ([place]) 03/02/2019  . Middle cerebral artery embolism, right 03/02/2019  . Chest pain 12/19/2015  . Numbness 12/19/2015  . Femoral hernia 03/24/2014  . Dyslipidemia 09/10/2011  . BRCA1 positive 09/10/2011  . Breast cancer ([place]) 09/10/2011  . H/O gastric bypass; 05/14/11([facility]) 09/10/2011  . Essential hypertension 10/28/2008  . DEPRESSION/ANXIETY 02/19/2007  . CARPAL TUNNEL SYNDROME, RIGHT 02/19/2007  . ALLERGIC RHINITIS 02/19/2007  . GERD 02/19/2007  . IRRITABLE BOWEL SYNDROME 02/19/2007  . HERPES GENITALIS 12/30/2006  . Diabetes ([place]) 12/30/2006  . OSTEOPENIA 12/30/2006  . ANOMALY, CONGENITAL, NERVOUS SYSTEM NEC 12/30/2006    Higinio Grow W. 09/25/2019, 2:21 PM  Frazier Butt., PT   [facility] [address] [place], [place], [address] Phone: [phone]   Fax:  [phone]  Name: SARY BOGIE MRN: [id number] Date of Birth: Apr 28, 1970

## 2019-09-25 NOTE — Addendum Note (Signed)
Addended by: Vianne Bulls D on: 09/25/2019 06:33 PM   Modules accepted: Orders

## 2019-09-25 NOTE — Patient Instructions (Signed)
Access Code: [email or web address] URL: [email or web address] Date: 09/25/2019 Prepared by: Mady Haagensen  Exercises Seated Pelvic Tilt - 1-2 x daily - 5 x weekly - 1-2 sets - 10 reps Supine Lower Trunk Rotation - 1 x daily - 7 x weekly - 1 sets - 3 reps - 30 sec hold Hooklying Isometric Clamshell - 1 x daily - 5 x weekly - 1-2 sets - 10 reps Supine Bridge - 1 x daily - 5 x weekly - 1-2 sets - 10 reps Sit to stand in stride stance - 1 x daily - 7 x weekly - 1-2 sets - 10 reps

## 2019-09-25 NOTE — Therapy (Addendum)
[place] [address] [place], [place], [address] Phone: [phone]   Fax:  [phone]  Occupational Therapy Evaluation  Patient Details  Name: Ann Lopez MRN: [id number] Date of Birth: Feb 18, 1971 Referring Provider (OT): Dr. Leeroy Cha   Encounter Date: 09/25/2019   OT End of Session - 09/25/19 1554    Visit Number 1    Number of Visits 21    Date for OT Re-Evaluation 11/24/19    Authorization Type [employer] Medicare and Medicaid--covered 100%    Authorization - Number of Visits 1    Progress Note Due on Visit 10    OT Start Time 0855    OT Stop Time [phone]    OT Time Calculation (min) 42 min    Activity Tolerance Patient tolerated treatment well    Behavior During Therapy [facility] for tasks assessed/performed           Past Medical History:  Diagnosis Date  . Allergy   . Anxiety   . Asthma   . Back pain   . Breast cancer ([place])   . Cancer ([place])   . Depression   . Diabetes mellitus   . Headache   . Hyperlipemia   . Osteopenia   . Osteoporosis    osteopenia  . Stroke [facility])     Past Surgical History:  Procedure Laterality Date  . ABDOMINAL HYSTERECTOMY     B oophorectomy for BRCA1 gene  . BREAST SURGERY    . CESAREAN SECTION    . INCISION AND DRAINAGE ABSCESS Left 11/14/2012   Procedure: INCISION AND DRAINAGE ABSCESS LEFT GROIN;  Surgeon: Jamesetta So, MD;  Location: [facility];  Service: General;  Laterality: Left;  . IR ANGIO VERTEBRAL SEL SUBCLAVIAN INNOMINATE UNI R MOD SED  03/02/2019  . IR CT HEAD LTD  03/02/2019  . IR INTRAVSC STENT CERV CAROTID W/O EMB-PROT MOD SED INC ANGIO  03/02/2019  . IR PERCUTANEOUS ART THROMBECTOMY/INFUSION INTRACRANIAL INC DIAG ANGIO  03/02/2019  . LYMPHADENECTOMY    . MASTECTOMY Bilateral   . RADIOLOGY WITH ANESTHESIA N/A 03/02/2019   Procedure: IR WITH ANESTHESIA;  Surgeon: Luanne Bras, MD;  Location: [place];  Service: Radiology;  Laterality: N/A;     There were no vitals filed for this visit.   Subjective Assessment - 09/25/19 0859    Subjective  pt reports that she began having pain during [facility] transport2 study (transcranial magnetic stimulation with m-CIT) that was 2hrs/day for 2 weeks with LUE.  Went to [facility] for my L shoulder, because my shoulder was hurting so bad--completed PT there, MRI scheduled next Friday.    Pertinent History R MCA  stroke with L shoulder pain.  PMH:  diabetes, anxiety, depression hyperlipidemia, smoking, breast cancer s/p bilateral mastectomy followed by chmo in 2009, R carotid stenosis requiring emergent thrombectomy    Limitations fall risk, pain    Patient Stated Goals improve L shoudler pain and ROM, be able to care for grandchild alone    Currently in Pain? Yes    Pain Score 4     Pain Location Shoulder    Pain Orientation Left    Pain Descriptors / Indicators Sharp    Pain Type Acute pain    Pain Onset More than a month ago    Pain Frequency Intermittent    Aggravating Factors  with movement, reaching overhead    Pain Relieving Factors heat helps  [facility] OT Assessment - 09/25/19 0001      Assessment   Medical Diagnosis Ischemic R MCA CVA 02/2019    Referring Provider (OT) Dr. Leeroy Cha    Onset Date/Surgical Date 03/02/19   finished [facility] study 07/2019   Hand Dominance Right    Prior Therapy CIR 21 days; HHPT through March      Precautions   Precautions Fall    Precaution Comments spasticity LUE and LLE; pain with significant L shoulder pain      Balance Screen   Has the patient fallen in the past 6 months No   1 with walking     Home  Environment   Family/patient expects to be discharged to: Private residence    Lives With Son   25 y.o.      Prior Function   Level of Independence Independent    Vocation On disability    Leisure Travelling with son to soccer tournaments, took care of grandchild (39 yo)      ADL   Eating/Feeding Modified independent     Grooming --   difficulty styling    Upper Body Bathing Modified independent   difficulty with LUE, can't use LUE for hair   Lower Body Bathing Modified independent    Upper Body Dressing Minimal assistance   needs assist for bra   Lower Body Dressing Modified independent   but pain LUE    Toilet Transfer Modified independent    Toileting - Clothing Manipulation Modified independent    Tub/Shower Transfer Modified independent    ADL comments Reports LUE pain with reaching down.  Pt attempts to use LUE, but pain limits use so has to use only RUE most of the time.      IADL   Shopping --   needs assist due to L shoulder pain   Prior Level of Function Light Housekeeping independent prior; now able to do laundry, dishes, hurts to swipe     Prior Level of Function Meal Prep independent prior; now doing simple tasks and needs assist to lift hot pans, cutting    Community Mobility Drives own vehicle   with RUE primarily     Mobility   Mobility Status Independent    Mobility Status Comments see PT eval for details      Vision - History   Baseline Vision Wears glasses for distance only    Additional Comments incr light sensitivity       Vision Assessment   Vision Assessment Vision not tested    Comment WFL per pt, except incr light sensitivity      Cognition   Overall Cognitive Status Cognition to be further assessed in functional context PRN    Awareness Impaired    Awareness Impairment --   decr awareness of LUE positioning   Cognition Comments pt denies deficits      Posture/Postural Control   Posture/Postural Control Postural limitations    Postural Limitations Rounded Shoulders      Sensation   Light Touch Appears Intact   per pt report     Coordination   9 Hole Peg Test Right;Left    Right 9 Hole Peg Test 22.25    Left 9 Hole Peg Test 40.81    Box and Blocks R-46blocks, L-28blocks      Tone   Assessment Location Left Upper Extremity      ROM / Strength   AROM / PROM /  Strength AROM;Strength;PROM      AROM  Overall AROM  Deficits    Overall AROM Comments L shoulder flex to 40* (limited by pain), abduction to 60*, and ER/IR to 75% with pain.  Noted IR/shoulder hike/flexor synergy pattern      PROM   Overall PROM  Deficits    Overall PROM Comments L shoulder to approx 90* in sitting, abduction to approx 60*      Strength   Overall Strength Deficits    Overall Strength Comments LUE not asssessed due to pain      Hand Function   Right Hand Grip (lbs) 61.7    Left Hand Grip (lbs) 42.7      LUE Tone   LUE Tone Mild                           OT Education - 09/25/19 1552    Education Details OT eval results and POC; Initial HEP--see pt instructions; proper positioning of L shoulder to avoid IR and synergy patterns, "thumb up," and proper positioning in bed; instructed pt not to use weights with LUE    Person(s) Educated Patient    Methods Explanation;Demonstration;Verbal cues;Handout    Comprehension Verbalized understanding;Verbal cues required;Returned demonstration;Need further instruction            OT Short Term Goals - 09/25/19 1815      OT SHORT TERM GOAL #1   Title Pt will be independent with initial HEP.--check STGs 10/25/19    Time 4    Period Weeks    Status New      OT SHORT TERM GOAL #2   Title Pt will demo at least 70* L shoulder flexion for functional reaching without pain.    Baseline 40*    Time 4    Period Weeks    Status New      OT SHORT TERM GOAL #3   Title Pt will verbalize understanding of proper positioning of LUE for decr pain.    Time 4    Period Weeks    Status New      OT SHORT TERM GOAL #4   Title Pt will be able to use LUE in simple light home maintenance tasks as non-dominant assist at least 50% of the time with pain less than 3/10 (wiping table, folding clothes, sweeping).    Time 4    Period Weeks    Status New      OT SHORT TERM GOAL #5   Title Pt will demo improved LUE  functional reaching for ADLs as shown by improving score on box and blocks test by at least 6.    Baseline 28 blocks    Time 4    Period Weeks    Status New      OT SHORT TERM GOAL #6   Title Pt will verbalize understanding of adaptive strategies/AE to incr independence with cooking tasks.    Time 4    Period Weeks    Status New             OT Long Term Goals - 09/25/19 1820      OT LONG TERM GOAL #1   Title Pt will be independent with updated HEP for LUE--check LTGs 11/24/19    Time 8    Period Weeks    Status New      OT LONG TERM GOAL #2   Title Pt will demo at least 90* L shoulder flex without pain for functional reaching.  Baseline 40*    Time 8    Period Weeks    Status New      OT LONG TERM GOAL #3   Title Pt will be able to use LUE as nondominant assist for functional tasks at least 75% of the time for light ADLs and IADLs with pain consistently less than or equal to 2/10.    Time 8    Period Weeks    Status New      OT LONG TERM GOAL #4   Title Pt will improve L grip strength by at least 8lbs to assist in opening containers/home maintenance tasks.    Baseline 42.7lbs    Time 8    Period Weeks    Status New      OT LONG TERM GOAL #5   Title Pt will improve LUE coordination as shown by improving time on 9-hole peg test by at least 8sec.    Baseline 40.81sec    Time 8    Period Weeks    Status New      OT LONG TERM GOAL #6   Title Pt will improve LUE functional reaching for ADLs as shown by improving score on box and blocks test by at least 10.    Baseline 28    Time 8    Period Weeks    Status New                 Plan - 09/25/19 1556    Clinical Impression Statement Pt is a 49 year old female who presents to OPPT with history of CVA and L-sided weakness and spasticity November 2020.  She had inpatient rehab x 3 weeks, then home health therapies.  Pt completed a [facility] study in March where she used LUE for 2hrs/day for 2 weeks.  Pt reports  that she began to develop L shoulder pain at this time.  Pt then began Ortho PT due to pain but has recently stopped due to continued pain.  MRI scheduled next Friday.  PMH:  diabetes, anxiety, depression hyperlipidemia, smoking, breast cancer s/p bilateral mastectomy followed by chmo in 2009, R carotid stenosis requiring emergent thrombectomy Prior to CVA, she was independent and going to son's travel soccer games, caring for 59 yo granddaughter. Pt presents today with L hemiparesis with pain, decr ROM, spasticity, decr coordination, decr balance for ADLs/IADLs and decr LUE functional use.  Pt would benefit from occupational therapy to incr LUE functional use and improve LUE pain.    OT Occupational Profile and History Detailed Assessment- Review of Records and additional review of physical, cognitive, psychosocial history related to current functional performance    Occupational performance deficits (Please refer to evaluation for details): IADL's;ADL's;Leisure    Body Structure / Function / Physical Skills ADL;Balance;IADL;ROM;Strength;Tone;FMC;Coordination;UE functional use;Decreased knowledge of precautions;GMC;Decreased knowledge of use of DME;Pain    Rehab Potential Good    Clinical Decision Making Several treatment options, min-mod task modification necessary    Comorbidities Affecting Occupational Performance: May have comorbidities impacting occupational performance    Modification or Assistance to Complete Evaluation  Min-Moderate modification of tasks or assist with assess necessary to complete eval    OT Frequency --   eval + 3x week for 4 weeks followed by 2x week for 4 weeks or 21 visits total.   OT Treatment/Interventions Self-care/ADL training;Moist Heat;DME and/or AE instruction;Therapeutic activities;Aquatic Therapy;Therapeutic exercise;Ultrasound;Passive range of motion;Functional Mobility Training;Neuromuscular education;Cryotherapy;Electrical Stimulation;Manual Therapy;Patient/family  education    Plan review initial HEP and add  to HEP, review proper positioning of LUE, manual therapy to address L shoulder pain and neuro re-ed    Consulted and Agree with Plan of Care Patient           Patient will benefit from skilled therapeutic intervention in order to improve the following deficits and impairments:   Body Structure / Function / Physical Skills: ADL, Balance, IADL, ROM, Strength, Tone, FMC, Coordination, UE functional use, Decreased knowledge of precautions, GMC, Decreased knowledge of use of DME, Pain       Visit Diagnosis: Hemiplegia and hemiparesis following cerebral infarction affecting left non-dominant side (HCC)  Other lack of coordination  Other symptoms and signs involving the musculoskeletal system  Other symptoms and signs involving the nervous system  Acute pain of left shoulder    Problem List Patient Active Problem List   Diagnosis Date Noted  . Vascular headache   . Other chronic pain   . Uncontrolled type 2 diabetes mellitus with hyperglycemia ([place])   . Labile blood glucose   . Panic disorder with agoraphobia and moderate panic attacks   . Major depressive disorder, recurrent episode, moderate ([place])   . MCI (mild cognitive impairment) with memory loss   . Acute ischemic right middle cerebral artery (MCA) stroke ([place]) 03/09/2019  . Left hemiparesis ([place])   . Bradycardia   . Tachypnea   . SIRS (systemic inflammatory response syndrome) (HCC)   . Hypokalemia   . Diabetes mellitus type 2 in obese ([place])   . Tobacco abuse   . History of breast cancer   . Cognitive deficits   . Dysphagia, post-stroke   . Stroke (cerebrum) ([place]) 03/02/2019  . Middle cerebral artery embolism, right 03/02/2019  . Chest pain 12/19/2015  . Numbness 12/19/2015  . Femoral hernia 03/24/2014  . Dyslipidemia 09/10/2011  . BRCA1 positive 09/10/2011  . Breast cancer ([place]) 09/10/2011  . H/O gastric bypass; 05/14/11([facility]) 09/10/2011  . Essential hypertension  10/28/2008  . DEPRESSION/ANXIETY 02/19/2007  . CARPAL TUNNEL SYNDROME, RIGHT 02/19/2007  . ALLERGIC RHINITIS 02/19/2007  . GERD 02/19/2007  . IRRITABLE BOWEL SYNDROME 02/19/2007  . HERPES GENITALIS 12/30/2006  . Diabetes ([place]) 12/30/2006  . OSTEOPENIA 12/30/2006  . ANOMALY, CONGENITAL, NERVOUS SYSTEM NEC 12/30/2006    [facility] 09/25/2019, 6:31 PM  [place] [address] [place], [place], [address] Phone: [phone]   Fax:  [phone]  Name: Ann Lopez MRN: [id number] Date of Birth: 25-Oct-1970   Vianne Bulls, OTR/L [facility] [address] [place], [place]  [address] [phone] phone [phone] 09/25/19 6:31 PM

## 2019-09-29 ENCOUNTER — Ambulatory Visit: Payer: Medicare Other

## 2019-09-29 ENCOUNTER — Other Ambulatory Visit: Payer: Self-pay

## 2019-09-29 DIAGNOSIS — M6281 Muscle weakness (generalized): Secondary | ICD-10-CM

## 2019-09-29 DIAGNOSIS — R278 Other lack of coordination: Secondary | ICD-10-CM

## 2019-09-29 DIAGNOSIS — I69354 Hemiplegia and hemiparesis following cerebral infarction affecting left non-dominant side: Secondary | ICD-10-CM

## 2019-09-29 DIAGNOSIS — R2689 Other abnormalities of gait and mobility: Secondary | ICD-10-CM

## 2019-09-29 DIAGNOSIS — R2681 Unsteadiness on feet: Secondary | ICD-10-CM

## 2019-09-29 NOTE — Patient Instructions (Signed)
Access Code: [email or web address] URL: [email or web address] Date: 09/29/2019 Prepared by: Baldomero Lamy  Exercises Seated Pelvic Tilt - 1-2 x daily - 5 x weekly - 1-2 sets - 10 reps Supine Lower Trunk Rotation - 1 x daily - 7 x weekly - 1 sets - 3 reps - 30 sec hold Hooklying Isometric Clamshell - 1 x daily - 5 x weekly - 1-2 sets - 10 reps Supine Bridge - 1 x daily - 5 x weekly - 1-2 sets - 10 reps Sit to stand in stride stance - 1 x daily - 7 x weekly - 1-2 sets - 10 reps Standing March with Counter Support - 1 x daily - 7 x weekly - 3 sets - 10 reps

## 2019-09-29 NOTE — Therapy (Signed)
[facility] [address] [place], [place], [address] Phone: [phone]   Fax:  [phone]  Physical Therapy Treatment  Patient Details  Name: Ann Lopez MRN: [id number] Date of Birth: [birth year] Referring Provider (PT): Dr. Leeroy Cha   Encounter Date: 09/29/2019   PT End of Session - 09/29/19 1736    Visit Number 3    Number of Visits 26    Date for PT Re-Evaluation 12/23/19    Authorization Type UHC Medicare/Medicaid    Authorization Time Period 09/24/2019-12/23/2019    Progress Note Due on Visit 10    PT Start Time 1751    PT Stop Time 1815    PT Time Calculation (min) 45 min    Activity Tolerance Patient tolerated treatment well;Other (comment)   No increase in pain today during session   Behavior During Therapy WFL for tasks assessed/performed           Past Medical History:  Diagnosis Date   Allergy    Anxiety    Asthma    Back pain    Breast cancer ([place])    Cancer ([place])    Depression    Diabetes mellitus    Headache    Hyperlipemia    Osteopenia    Osteoporosis    osteopenia   Stroke [facility])     Past Surgical History:  Procedure Laterality Date   ABDOMINAL HYSTERECTOMY     B oophorectomy for BRCA1 gene   BREAST SURGERY     CESAREAN SECTION     INCISION AND DRAINAGE ABSCESS Left 11/14/2012   Procedure: INCISION AND DRAINAGE ABSCESS LEFT GROIN;  Surgeon: Jamesetta So, MD;  Location: [facility];  Service: General;  Laterality: Left;   IR ANGIO VERTEBRAL SEL SUBCLAVIAN INNOMINATE UNI R MOD SED  03/02/2019   IR CT HEAD LTD  03/02/2019   IR INTRAVSC STENT CERV CAROTID W/O EMB-PROT MOD SED INC ANGIO  03/02/2019   IR PERCUTANEOUS ART THROMBECTOMY/INFUSION INTRACRANIAL INC DIAG ANGIO  03/02/2019   LYMPHADENECTOMY     MASTECTOMY Bilateral    RADIOLOGY WITH ANESTHESIA N/A 03/02/2019   Procedure: IR WITH ANESTHESIA;  Surgeon: Luanne Bras, MD;  Location: [facility];   Service: Radiology;  Laterality: N/A;    There were no vitals filed for this visit.   Subjective Assessment - 09/29/19 1734    Subjective Patient reports feeling fine. No new complaints or issues. Patient reports that the shoulder was bothering her this morning, but has improved as the day went on. Patient reports that the HEP is going well.    Pertinent History [facility]:ENIDPOEU, hyperlipidemia, smoking, breast cancer, R carotid stenosis requiring emergent thrombectomy    Patient Stated Goals Pt wants all the movement back in L side.    Currently in Pain? No/denies    Pain Onset [facility] Adult PT Treatment/Exercise - 09/29/19 0001      Transfers   Transfers Sit to Stand;Stand to Sit    Sit to Stand 6: Modified independent (Device/Increase time);Without upper extremity assist;From bed    Stand to Sit 6: Modified independent (Device/Increase time);Without upper extremity assist;To bed    Comments Completed sit <> stand with RLE positioned on 2" step to promote strengthening of LLE and weight shift. 2 x 10 reps completed w/o UE support.  Ambulation/Gait   Ambulation/Gait Yes    Ambulation/Gait Assistance 6: Modified independent (Device/Increase time)    Assistive device None    Gait Pattern Step-through pattern;Decreased arm swing - left;Decreased stance time - left;Decreased step length - right;Decreased hip/knee flexion - left;Decreased dorsiflexion - left;Decreased weight shift to left;Decreased trunk rotation;Trendelenburg    Ambulation Surface Level;Indoor    Stairs Yes    Stairs Assistance 5: Supervision    Stairs Assistance Details (indicate cue type and reason) Patient demo alternating pattern with ascend/descending steps. Pt reports that at home she descends step with step to pattern due to being cautious.    Stair Management Technique One rail Right;Alternating pattern;Forwards    Number of Stairs 8    Height of Stairs 6        Neuro Re-ed    Neuro Re-ed Details  In standing, with RLE positioned on soccer ball, completed forward/back rolls 1 x 15 reps, and lateral rolls 1 x 15. Pt demo increased difficulty with lateral, and require CGA to maintain balance intermittently.       Exercises   Exercises Knee/Hip      Knee/Hip Exercises: Standing   Lateral Step Up Left;1 set;15 reps;Step Height: 6";Hand Hold: 1    Forward Step Up Left;1 set;15 reps;Step Height: 6";Hand Hold: 1    Other Standing Knee Exercises Completed standing marching at countertop, using BUE support. Completed 1 x 15 reps. Verbal cues for upright posture with completion and proper technique.                Balance Exercises - 09/29/19 0001      Balance Exercises: Standing   Standing Eyes Opened Narrow base of support (BOS);Head turns;Foam/compliant surface;Other reps (comment)   1 x 10 of horizontal/vertical head turns   Standing Eyes Closed Narrow base of support (BOS);Foam/compliant surface;3 reps;30 secs    Tandem Stance Eyes open;3 reps;30 secs    Other Standing Exercises Comments Pt demonstrate increased difficulty with vision removed, and vertical head turns. Intermittent CGA required for steadying.              PT Education - 09/29/19 1811    Education Details Educated on HEP addition (see patient instructions)    Person(s) Educated Patient    Methods Explanation;Demonstration;Handout    Comprehension Verbalized understanding;Returned demonstration            PT Short Term Goals - 09/24/19 1448      PT SHORT TERM GOAL #1   Title Pt will be independent with HEP for improved balance, strength, gait.  TARGET 5 weeks for all LTGs: 10/23/2019    Time 5    Period Weeks    Status New      PT SHORT TERM GOAL #2   Title Pt will improve 5x sit<>stand score to less than or equal to 12 seconds for improved functional strength.    Time 5    Period Weeks    Status New      PT SHORT TERM GOAL #3   Title Pt will improve FGA  score to at least 20/30 for decreased fall risk.    Time 5    Period Weeks    Status New      PT SHORT TERM GOAL #4   Title Pt will verbalize understanding of fall prevention in home environment.    Time 5    Period Weeks    Status New  PT Long Term Goals - 09/24/19 1451      PT LONG TERM GOAL #1   Title Pt will be independent with progression of HEP for strength, balance, and gait.  TARGET 11/20/2019    Time 9    Period Weeks    Status New      PT LONG TERM GOAL #2   Title Pt will improve 5x sit<>stand score to less than or equal to 10 seconds for improved functional strength.    Time 9    Period Weeks    Status New      PT LONG TERM GOAL #3   Title Pt will improve Functional Gait Assessment score to at least 22/30 for decreased fall risk.    Time 9    Period Weeks    Status New      PT LONG TERM GOAL #4   Title Pt will improve gait velocity to at least 3 ft/sec for improved gait efficiency for community ambulation.    Time 9    Period Weeks    Status New      PT LONG TERM GOAL #5   Title Pt will ambulate at least 1000 ft, outdoor and indoor surfaces, appropriate device as needed, modified independently, for improved community gait.    Time 9    Period Weeks    Status New      Additional Long Term Goals   Additional Long Term Goals Yes      PT LONG TERM GOAL #6   Title FOTO score to improve by at least 10 % for improved overall reported functional mobility.    Time 9    Period Weeks    Status New                 Plan - 09/29/19 2048    Clinical Impression Statement Today's skilled PT session focused on NMR to promote LLE strength and weight shift. Also initiated balance actvities, with patient demo increased unsteadiness with vision removed, complaint surfaces, and vertical head turns. Patient will continue to benefit from skilled PT services to progress toward goals and maximize functional mobility.    Personal Factors and Comorbidities  Comorbidity 3+    Comorbidities PMH:  diabetes, hyperlipidemia, smoking, breast cancer, R carotid stenosis requiring emergent thrombectomy, anxiety depression    Examination-Activity Limitations Locomotion Level;Transfers;Reach Overhead;Stand;Stairs;Lift;Dressing    Examination-Participation Restrictions Community Activity;Other   caring for grandchild   Stability/Clinical Decision Making Evolving/Moderate complexity    Rehab Potential Good    PT Frequency Other (comment)   1x/wk for 1 week, then 3x/wk for 8 weeks   PT Duration Other (comment)   9 weeks   PT Treatment/Interventions ADLs/Self Care Home Management;Electrical Stimulation;DME Instruction;Gait training;Stair training;Functional mobility training;Therapeutic activities;Therapeutic exercise;Balance training;Neuromuscular re-education;Manual techniques;Orthotic Fit/Training;Patient/family education;Passive range of motion    PT Next Visit Plan How was HEP addition (continue to add for strengthening; LLE weightbearing, trunk control and balance). Gait training for improved timing/coordination of gait. Balance exercises (add to HEP as needed). Amy - Patient spoke of interest exercising in the pool at end of session and I mentioned aquatics, but did not have much time to go into detail. Next session, if you have time speak further to her about this and see if she is interested and we can submit referral?    Consulted and Agree with Plan of Care Patient           Patient will benefit from skilled therapeutic intervention in order to  improve the following deficits and impairments:  Abnormal gait, Decreased coordination, Difficulty walking, Impaired tone, Impaired UE functional use, Decreased activity tolerance, Decreased balance, Decreased mobility, Decreased strength, Postural dysfunction  Visit Diagnosis: Hemiplegia and hemiparesis following cerebral infarction affecting left non-dominant side (HCC)  Other lack of coordination  Muscle  weakness (generalized)  Other abnormalities of gait and mobility  Unsteadiness on feet     Problem List Patient Active Problem List   Diagnosis Date Noted   Vascular headache    Other chronic pain    Uncontrolled type 2 diabetes mellitus with hyperglycemia (HCC)    Labile blood glucose    Panic disorder with agoraphobia and moderate panic attacks    Major depressive disorder, recurrent episode, moderate (HCC)    MCI (mild cognitive impairment) with memory loss    Acute ischemic right middle cerebral artery (MCA) stroke ([place]) 03/09/2019   Left hemiparesis (HCC)    Bradycardia    Tachypnea    SIRS (systemic inflammatory response syndrome) (HCC)    Hypokalemia    Diabetes mellitus type 2 in obese (HCC)    Tobacco abuse    History of breast cancer    Cognitive deficits    Dysphagia, post-stroke    Stroke (cerebrum) ([place]) 03/02/2019   Middle cerebral artery embolism, right 03/02/2019   Chest pain 12/19/2015   Numbness 12/19/2015   Femoral hernia 03/24/2014   Dyslipidemia 09/10/2011   BRCA1 positive 09/10/2011   Breast cancer ([place]) 09/10/2011   H/O gastric bypass; 05/14/11([facility]) 09/10/2011   Essential hypertension 10/28/2008   DEPRESSION/ANXIETY 02/19/2007   CARPAL TUNNEL SYNDROME, RIGHT 02/19/2007   ALLERGIC RHINITIS 02/19/2007   GERD 02/19/2007   IRRITABLE BOWEL SYNDROME 02/19/2007   HERPES GENITALIS 12/30/2006   Diabetes ([place]) 12/30/2006   OSTEOPENIA 12/30/2006   ANOMALY, CONGENITAL, NERVOUS SYSTEM NEC 12/30/2006    Jones Bales, PT, DPT 09/29/2019, 8:54 PM  [facility] [address] [place], [place], [address] Phone: [phone]   Fax:  [phone]  Name: Ann Lopez MRN: [id number] Date of Birth: 1970-08-30

## 2019-09-30 ENCOUNTER — Ambulatory Visit: Payer: Medicare Other | Admitting: Physical Therapy

## 2019-09-30 DIAGNOSIS — R2689 Other abnormalities of gait and mobility: Secondary | ICD-10-CM

## 2019-09-30 DIAGNOSIS — R293 Abnormal posture: Secondary | ICD-10-CM

## 2019-09-30 DIAGNOSIS — M6281 Muscle weakness (generalized): Secondary | ICD-10-CM

## 2019-09-30 NOTE — Therapy (Signed)
[facility] [address] [place], [place], [address] Phone: [phone]   Fax:  [phone]  Physical Therapy Treatment  Patient Details  Name: Ann Lopez MRN: [id number] Date of Birth: 09-Dec-1970 Referring Provider (PT): Dr. Leeroy Cha   Encounter Date: 09/30/2019   PT End of Session - 09/30/19 1844    Visit Number 4    Number of Visits 26    Date for PT Re-Evaluation 12/23/19    Authorization Type UHC Medicare/Medicaid    Authorization Time Period 09/24/2019-12/23/2019    Progress Note Due on Visit 10    PT Start Time 2330    PT Stop Time 1828    PT Time Calculation (min) 41 min    Equipment Utilized During Treatment Gait belt    Activity Tolerance Patient tolerated treatment well;Other (comment)   No increase in pain today during session   Behavior During Therapy [facility] for tasks assessed/performed           Past Medical History:  Diagnosis Date  . Allergy   . Anxiety   . Asthma   . Back pain   . Breast cancer ([place])   . Cancer ([place])   . Depression   . Diabetes mellitus   . Headache   . Hyperlipemia   . Osteopenia   . Osteoporosis    osteopenia  . Stroke [facility])     Past Surgical History:  Procedure Laterality Date  . ABDOMINAL HYSTERECTOMY     B oophorectomy for BRCA1 gene  . BREAST SURGERY    . CESAREAN SECTION    . INCISION AND DRAINAGE ABSCESS Left 11/14/2012   Procedure: INCISION AND DRAINAGE ABSCESS LEFT GROIN;  Surgeon: Jamesetta So, MD;  Location: [facility];  Service: General;  Laterality: Left;  . IR ANGIO VERTEBRAL SEL SUBCLAVIAN INNOMINATE UNI R MOD SED  03/02/2019  . IR CT HEAD LTD  03/02/2019  . IR INTRAVSC STENT CERV CAROTID W/O EMB-PROT MOD SED INC ANGIO  03/02/2019  . IR PERCUTANEOUS ART THROMBECTOMY/INFUSION INTRACRANIAL INC DIAG ANGIO  03/02/2019  . LYMPHADENECTOMY    . MASTECTOMY Bilateral   . RADIOLOGY WITH ANESTHESIA N/A 03/02/2019   Procedure: IR WITH ANESTHESIA;   Surgeon: Luanne Bras, MD;  Location: [facility];  Service: Radiology;  Laterality: N/A;    There were no vitals filed for this visit.   Subjective Assessment - 09/30/19 1750    Subjective Interested in getting into the pool at the [facility]; interested in pool PT.    Pertinent History QTM:[id number], hyperlipidemia, smoking, breast cancer, R carotid stenosis requiring emergent thrombectomy    Patient Stated Goals Pt wants all the movement back in L side.    Currently in Pain? Yes    Pain Score 5     Pain Location Back    Pain Orientation Left;Lower    Pain Descriptors / Indicators Sharp;Stabbing    Pain Type Chronic pain    Pain Onset Yesterday    Pain Frequency Intermittent    Aggravating Factors  certain movements    Pain Relieving Factors pain medication                             [facility] Adult PT Treatment/Exercise - 09/30/19 0001      Transfers   Transfers Sit to Stand;Stand to Sit    Sit to Stand 6: Modified independent (Device/Increase time);Without upper extremity assist;From bed    Stand to  Sit 6: Modified independent (Device/Increase time);Without upper extremity assist;To bed    Comments Completed sit <> stand with RLE positioned on 2" step to promote strengthening of LLE and weight shift. 2 x 10 reps completed w/o UE support. Utilized mirror and tactile cues to facilitate decreased reliance on RLE and improved LLE weightshift.       Ambulation/Gait   Ambulation/Gait Yes    Ambulation/Gait Assistance 5: Supervision    Ambulation Distance (Feet) 100 Feet   x 2   Assistive device None    Gait Pattern Step-through pattern;Decreased arm swing - left;Decreased stance time - left;Decreased step length - right;Decreased hip/knee flexion - left;Decreased dorsiflexion - left;Decreased weight shift to left;Decreased trunk rotation;Trendelenburg;Left genu recurvatum    Ambulation Surface Level;Indoor    Gait Comments Pt with significant Trendelenburg pattern with  gait.  PT attempts to provide cues through L trunk for upright posture.      Neuro Re-ed    Neuro Re-ed Details  Standing with LLE as stance, RLE positioned on 4" step, cues for upright posture through L hip and L trunk , progressing to RUE reaching to cabinet x 5 reps  (Pt goes into increased L knee recurvatum with LLE in stance, needs tactile cues for softened L knee).               Balance Exercises - 09/30/19 0001      Balance Exercises: Standing   SLS with Vectors Solid surface;Upper extremity assist 1;Other reps (comment)   10 reps alternating legs   Other Standing Exercises Side step taps to 6" step, 10 reps, RLE tapping to step, cues for upright posture through trunk.    Other Standing Exercises Comments Working to promote LLE stance and weightbearing and improved trunk control in this position, with RLE step taps to 12" step, then return to floor with RUE support.  Progressed to RLE 12" step tap forward, then RLE hip/knee extension into runner's stretch position, x 10 reps, for improved L knee control with transitional movements)  RUE support at handrail and cues to relax through R shoulder.           Therapeutic Exercise Reviewed HEP: Seated Pelvic Tilt - 1-2 x daily - 5 x weekly - 1-2 sets - 10 reps (also performed in supine) Supine Lower Trunk Rotation - 1 x daily - 7 x weekly - 1 sets - 3 reps - 30 sec hold Hooklying Isometric Clamshell - 1 x daily - 5 x weekly - 1-2 sets - 10 reps (cues for trunk control and slow control of leg out to side; cues to increase to 3 sets of 10) Supine Bridge - 1 x daily - 5 x weekly - 1-2 sets - 10 reps Sit to stand in stride stance - 1 x daily - 7 x weekly - 1-2 sets - 10 reps Marching in place x 10 reps-cues for upright posture through trunk    PT Short Term Goals - 09/24/19 1448      PT SHORT TERM GOAL #1   Title Pt will be independent with HEP for improved balance, strength, gait.  TARGET 5 weeks for all LTGs: 10/23/2019    Time 5      Period Weeks    Status New      PT SHORT TERM GOAL #2   Title Pt will improve 5x sit<>stand score to less than or equal to 12 seconds for improved functional strength.    Time 5    Period  Weeks    Status New      PT SHORT TERM GOAL #3   Title Pt will improve FGA score to at least 20/30 for decreased fall risk.    Time 5    Period Weeks    Status New      PT SHORT TERM GOAL #4   Title Pt will verbalize understanding of fall prevention in home environment.    Time 5    Period Weeks    Status New             PT Long Term Goals - 09/24/19 1451      PT LONG TERM GOAL #1   Title Pt will be independent with progression of HEP for strength, balance, and gait.  TARGET 11/20/2019    Time 9    Period Weeks    Status New      PT LONG TERM GOAL #2   Title Pt will improve 5x sit<>stand score to less than or equal to 10 seconds for improved functional strength.    Time 9    Period Weeks    Status New      PT LONG TERM GOAL #3   Title Pt will improve Functional Gait Assessment score to at least 22/30 for decreased fall risk.    Time 9    Period Weeks    Status New      PT LONG TERM GOAL #4   Title Pt will improve gait velocity to at least 3 ft/sec for improved gait efficiency for community ambulation.    Time 9    Period Weeks    Status New      PT LONG TERM GOAL #5   Title Pt will ambulate at least 1000 ft, outdoor and indoor surfaces, appropriate device as needed, modified independently, for improved community gait.    Time 9    Period Weeks    Status New      Additional Long Term Goals   Additional Long Term Goals Yes      PT LONG TERM GOAL #6   Title FOTO score to improve by at least 10 % for improved overall reported functional mobility.    Time 9    Period Weeks    Status New                 Plan - 09/30/19 1845    Clinical Impression Statement With single limb stance on LLE to focus on increased LLE weightbearing, PT also provides tactile cues for  upright posture through L trunk; during these static/stationary activities, she is able to maintain upright trunk with RUE supported.  With gait (no device), she reverts quickly back to significant LLE Trendelenburg pattern and L lateral trunk lean.  Question if use of device would be helpful in addition to L hip strengthening.  Also, pt would be a good candidate for and is interested in aquatic therapy.    Personal Factors and Comorbidities Comorbidity 3+    Comorbidities PMH:  diabetes, hyperlipidemia, smoking, breast cancer, R carotid stenosis requiring emergent thrombectomy, anxiety depression    Examination-Activity Limitations Locomotion Level;Transfers;Reach Overhead;Stand;Stairs;Lift;Dressing    Examination-Participation Restrictions Community Activity;Other   caring for grandchild   Stability/Clinical Decision Making Evolving/Moderate complexity    Rehab Potential Good    PT Frequency Other (comment)   1x/wk for 1 week, then 3x/wk for 8 weeks   PT Duration Other (comment)   9 weeks   PT Treatment/Interventions ADLs/Self Care Home Management;Electrical  Stimulation;DME Instruction;Gait training;Stair training;Functional mobility training;Therapeutic activities;Therapeutic exercise;Balance training;Neuromuscular re-education;Manual techniques;Orthotic Fit/Training;Patient/family education;Passive range of motion;Aquatic Therapy    PT Next Visit Plan Continue to add to HEP and work on strengthening of LLE/hip and trunk, LLE weightbearing, trunk control and balance. Gait training for improved timing/coordination of gait; consider trying with cane to help less Trendlenburg and L lateral trunk lean. Balance exercises (add to HEP as needed).    Recommended Other Services Aquatic therapy (pt in agreement to pursue this)-will initiate referral to [facility] for aquatic therapy    Consulted and Agree with Plan of Care Patient           Patient will benefit from skilled therapeutic intervention in  order to improve the following deficits and impairments:  Abnormal gait, Decreased coordination, Difficulty walking, Impaired tone, Impaired UE functional use, Decreased activity tolerance, Decreased balance, Decreased mobility, Decreased strength, Postural dysfunction  Visit Diagnosis: Muscle weakness (generalized)  Abnormal posture  Other abnormalities of gait and mobility     Problem List Patient Active Problem List   Diagnosis Date Noted  . Vascular headache   . Other chronic pain   . Uncontrolled type 2 diabetes mellitus with hyperglycemia ([place])   . Labile blood glucose   . Panic disorder with agoraphobia and moderate panic attacks   . Major depressive disorder, recurrent episode, moderate ([place])   . MCI (mild cognitive impairment) with memory loss   . Acute ischemic right middle cerebral artery (MCA) stroke ([place]) 03/09/2019  . Left hemiparesis ([place])   . Bradycardia   . Tachypnea   . SIRS (systemic inflammatory response syndrome) (HCC)   . Hypokalemia   . Diabetes mellitus type 2 in obese ([place])   . Tobacco abuse   . History of breast cancer   . Cognitive deficits   . Dysphagia, post-stroke   . Stroke (cerebrum) ([place]) 03/02/2019  . Middle cerebral artery embolism, right 03/02/2019  . Chest pain 12/19/2015  . Numbness 12/19/2015  . Femoral hernia 03/24/2014  . Dyslipidemia 09/10/2011  . BRCA1 positive 09/10/2011  . Breast cancer ([place]) 09/10/2011  . H/O gastric bypass; 05/14/11([facility]) 09/10/2011  . Essential hypertension 10/28/2008  . DEPRESSION/ANXIETY 02/19/2007  . CARPAL TUNNEL SYNDROME, RIGHT 02/19/2007  . ALLERGIC RHINITIS 02/19/2007  . GERD 02/19/2007  . IRRITABLE BOWEL SYNDROME 02/19/2007  . HERPES GENITALIS 12/30/2006  . Diabetes ([place]) 12/30/2006  . OSTEOPENIA 12/30/2006  . ANOMALY, CONGENITAL, NERVOUS SYSTEM NEC 12/30/2006    Tayte Mcwherter W. 09/30/2019, 6:52 PM  Frazier Butt., PT   [facility] [address] [place], [place], [address] Phone: [phone]   Fax:  [phone]  Name: Ann Lopez MRN: [id number] Date of Birth: [birth year]

## 2019-10-01 NOTE — Addendum Note (Signed)
Addended by: Frazier Butt on: 10/01/2019 10:43 PM   Modules accepted: Orders

## 2019-10-02 ENCOUNTER — Other Ambulatory Visit: Payer: Self-pay

## 2019-10-02 ENCOUNTER — Ambulatory Visit: Payer: Medicare Other

## 2019-10-02 DIAGNOSIS — R293 Abnormal posture: Secondary | ICD-10-CM

## 2019-10-02 DIAGNOSIS — R2689 Other abnormalities of gait and mobility: Secondary | ICD-10-CM | POA: Diagnosis not present

## 2019-10-02 DIAGNOSIS — M6281 Muscle weakness (generalized): Secondary | ICD-10-CM

## 2019-10-02 DIAGNOSIS — R2681 Unsteadiness on feet: Secondary | ICD-10-CM

## 2019-10-02 NOTE — Therapy (Signed)
[facility] [address] [place], [place], [address] Phone: [phone]   Fax:  [phone]  Physical Therapy Treatment  Patient Details  Name: Ann Lopez MRN: [id number] Date of Birth: [birth year] Referring Provider (PT): Dr. Leeroy Cha   Encounter Date: 10/02/2019   PT End of Session - 10/02/19 1630    Visit Number 5    Number of Visits 26    Date for PT Re-Evaluation 12/23/19    Authorization Type [facility] Medicare/Medicaid    Authorization Time Period 09/24/2019-12/23/2019    Progress Note Due on Visit 10    PT Start Time 1532    PT Stop Time 1615    PT Time Calculation (min) 43 min    Equipment Utilized During Treatment Gait belt    Activity Tolerance Patient tolerated treatment well;Other (comment)   No increase in pain today during session   Behavior During Therapy [facility] for tasks assessed/performed           Past Medical History:  Diagnosis Date  . Allergy   . Anxiety   . Asthma   . Back pain   . Breast cancer ([place])   . Cancer ([place])   . Depression   . Diabetes mellitus   . Headache   . Hyperlipemia   . Osteopenia   . Osteoporosis    osteopenia  . Stroke [facility])     Past Surgical History:  Procedure Laterality Date  . ABDOMINAL HYSTERECTOMY     B oophorectomy for BRCA1 gene  . BREAST SURGERY    . CESAREAN SECTION    . INCISION AND DRAINAGE ABSCESS Left 11/14/2012   Procedure: INCISION AND DRAINAGE ABSCESS LEFT GROIN;  Surgeon: Jamesetta So, MD;  Location: [facility];  Service: General;  Laterality: Left;  . IR ANGIO VERTEBRAL SEL SUBCLAVIAN INNOMINATE UNI R MOD SED  03/02/2019  . IR CT HEAD LTD  03/02/2019  . IR INTRAVSC STENT CERV CAROTID W/O EMB-PROT MOD SED INC ANGIO  03/02/2019  . IR PERCUTANEOUS ART THROMBECTOMY/INFUSION INTRACRANIAL INC DIAG ANGIO  03/02/2019  . LYMPHADENECTOMY    . MASTECTOMY Bilateral   . RADIOLOGY WITH ANESTHESIA N/A 03/02/2019   Procedure: IR WITH ANESTHESIA;   Surgeon: Luanne Bras, MD;  Location: [facility];  Service: Radiology;  Laterality: N/A;    There were no vitals filed for this visit.   Subjective Assessment - 10/02/19 1535    Subjective Patient reports that she had increased back pain, which believes was due to increases activity around the home.    Pertinent History VVK:[id number], hyperlipidemia, smoking, breast cancer, R carotid stenosis requiring emergent thrombectomy    Patient Stated Goals Pt wants all the movement back in L side.    Currently in Pain? Yes    Pain Score 5     Pain Location Back    Pain Orientation Left;Lower    Pain Descriptors / Indicators Sharp    Pain Type Chronic pain    Pain Onset In the past 7 days                             [facility] Adult PT Treatment/Exercise - 10/02/19 0001      Transfers   Transfers Sit to Stand;Stand to Sit    Sit to Stand 6: Modified independent (Device/Increase time);Without upper extremity assist;From bed    Stand to Sit 6: Modified independent (Device/Increase time);Without upper extremity assist;To bed  Comments Completed sit <> stand with RLE positioned on RLE to further promote LLE and weight. Mirror placed in front for visual feedback, PT providing manual faciliation for weight shift to L at pelvis. Completed 2 x 10 reps      Ambulation/Gait   Ambulation/Gait Yes    Ambulation/Gait Assistance 5: Supervision    Ambulation/Gait Assistance Details throughout therapy session with activiteis    Assistive device None    Gait Pattern Step-through pattern;Decreased arm swing - left;Decreased stance time - left;Decreased step length - right;Decreased hip/knee flexion - left;Decreased dorsiflexion - left;Decreased weight shift to left;Decreased trunk rotation;Trendelenburg;Left genu recurvatum    Ambulation Surface Level;Indoor      Neuro Re-ed    Neuro Re-ed Details  Standing activity, completed toe tap to target with 3 sec hold, 2 x 10 reps to promote LLE  strenghtening and improved weight shift.       Exercises   Exercises Other Exercises    Other Exercises  Completed lateral hip hike in standing, 1 x 15 reps on 6" step with focus on improved control.       Lumbar Exercises: Aerobic   Nustep Completed x 7 mins on NuStep at Level 5. Completed with BLE's., No UE's used due to LUE pain. Initiated endurance training on NuStep with focus on getting intitial parameters to allow for patient to complete at gym. PT educated on proper progression of resistance and/or time on Nustep for further strenghtening and endurance training.       Knee/Hip Exercises: Standing   Other Standing Knee Exercises Completed backward step ups with LLE on 6" step, 2 x 10 reps completed. Patient completed with single UE support.                     PT Short Term Goals - 09/24/19 1448      PT SHORT TERM GOAL #1   Title Pt will be independent with HEP for improved balance, strength, gait.  TARGET 5 weeks for all LTGs: 10/23/2019    Time 5    Period Weeks    Status New      PT SHORT TERM GOAL #2   Title Pt will improve 5x sit<>stand score to less than or equal to 12 seconds for improved functional strength.    Time 5    Period Weeks    Status New      PT SHORT TERM GOAL #3   Title Pt will improve FGA score to at least 20/30 for decreased fall risk.    Time 5    Period Weeks    Status New      PT SHORT TERM GOAL #4   Title Pt will verbalize understanding of fall prevention in home environment.    Time 5    Period Weeks    Status New             PT Long Term Goals - 09/24/19 1451      PT LONG TERM GOAL #1   Title Pt will be independent with progression of HEP for strength, balance, and gait.  TARGET 11/20/2019    Time 9    Period Weeks    Status New      PT LONG TERM GOAL #2   Title Pt will improve 5x sit<>stand score to less than or equal to 10 seconds for improved functional strength.    Time 9    Period Weeks    Status New  PT  LONG TERM GOAL #3   Title Pt will improve Functional Gait Assessment score to at least 22/30 for decreased fall risk.    Time 9    Period Weeks    Status New      PT LONG TERM GOAL #4   Title Pt will improve gait velocity to at least 3 ft/sec for improved gait efficiency for community ambulation.    Time 9    Period Weeks    Status New      PT LONG TERM GOAL #5   Title Pt will ambulate at least 1000 ft, outdoor and indoor surfaces, appropriate device as needed, modified independently, for improved community gait.    Time 9    Period Weeks    Status New      Additional Long Term Goals   Additional Long Term Goals Yes      PT LONG TERM GOAL #6   Title FOTO score to improve by at least 10 % for improved overall reported functional mobility.    Time 9    Period Weeks    Status New                 Plan - 10/02/19 1632    Clinical Impression Statement Continued activities today to promote LLE strengthening and weight shift. Patient continue to p=rovide manual faciliation for weight shift as needed. Patient also completed endurnace training on NuStep as patient verbalizes would like to begin to complete at gym. PT educating on starting parameters and proper progression. PT will also initiate referral for aquatic therapy. Patient will continue to benefit from skilled PT services to progress toward all goals.    Personal Factors and Comorbidities Comorbidity 3+    Comorbidities PMH:  diabetes, hyperlipidemia, smoking, breast cancer, R carotid stenosis requiring emergent thrombectomy, anxiety depression    Examination-Activity Limitations Locomotion Level;Transfers;Reach Overhead;Stand;Stairs;Lift;Dressing    Examination-Participation Restrictions Community Activity;Other   caring for grandchild   Stability/Clinical Decision Making Evolving/Moderate complexity    Rehab Potential Good    PT Frequency Other (comment)   1x/wk for 1 week, then 3x/wk for 8 weeks   PT Duration Other  (comment)   9 weeks   PT Treatment/Interventions ADLs/Self Care Home Management;Electrical Stimulation;DME Instruction;Gait training;Stair training;Functional mobility training;Therapeutic activities;Therapeutic exercise;Balance training;Neuromuscular re-education;Manual techniques;Orthotic Fit/Training;Patient/family education;Passive range of motion;Aquatic Therapy    PT Next Visit Plan Continue to add to HEP and work on strengthening of LLE/hip and trunk, LLE weightbearing, trunk control and balance. Gait training for improved timing/coordination of gait; consider trying with cane to help less [place] and L lateral trunk lean. Balance exercises (add to HEP as needed).    Recommended Other Services Aquatic Therapy (PT will initiate referral)    Consulted and Agree with Plan of Care Patient           Patient will benefit from skilled therapeutic intervention in order to improve the following deficits and impairments:  Abnormal gait, Decreased coordination, Difficulty walking, Impaired tone, Impaired UE functional use, Decreased activity tolerance, Decreased balance, Decreased mobility, Decreased strength, Postural dysfunction  Visit Diagnosis: Muscle weakness (generalized)  Abnormal posture  Other abnormalities of gait and mobility  Unsteadiness on feet     Problem List Patient Active Problem List   Diagnosis Date Noted  . Vascular headache   . Other chronic pain   . Uncontrolled type 2 diabetes mellitus with hyperglycemia ([place])   . Labile blood glucose   . Panic disorder with agoraphobia and moderate panic  attacks   . Major depressive disorder, recurrent episode, moderate ([place])   . MCI (mild cognitive impairment) with memory loss   . Acute ischemic right middle cerebral artery (MCA) stroke ([place]) 03/09/2019  . Left hemiparesis ([place])   . Bradycardia   . Tachypnea   . SIRS (systemic inflammatory response syndrome) ([facility])   . Hypokalemia   . Diabetes mellitus type 2 in obese  ([place])   . Tobacco abuse   . History of breast cancer   . Cognitive deficits   . Dysphagia, post-stroke   . Stroke (cerebrum) ([place]) 03/02/2019  . Middle cerebral artery embolism, right 03/02/2019  . Chest pain 12/19/2015  . Numbness 12/19/2015  . Femoral hernia 03/24/2014  . Dyslipidemia 09/10/2011  . BRCA1 positive 09/10/2011  . Breast cancer ([place]) 09/10/2011  . H/O gastric bypass; 05/14/11([facility]) 09/10/2011  . Essential hypertension 10/28/2008  . DEPRESSION/ANXIETY 02/19/2007  . CARPAL TUNNEL SYNDROME, RIGHT 02/19/2007  . ALLERGIC RHINITIS 02/19/2007  . GERD 02/19/2007  . IRRITABLE BOWEL SYNDROME 02/19/2007  . HERPES GENITALIS 12/30/2006  . Diabetes ([place]) 12/30/2006  . OSTEOPENIA 12/30/2006  . ANOMALY, CONGENITAL, NERVOUS SYSTEM NEC 12/30/2006    Jones Bales, PT, DPT 10/02/2019, 4:36 PM  [facility] [address] [place], [place], [address] Phone: [phone]   Fax:  [phone]  Name: EMARI HREHA MRN: [id number] Date of Birth: 14-Jun-1970

## 2019-10-06 ENCOUNTER — Other Ambulatory Visit: Payer: Self-pay

## 2019-10-06 ENCOUNTER — Encounter: Payer: Self-pay | Admitting: Physical Therapy

## 2019-10-06 ENCOUNTER — Ambulatory Visit: Payer: Medicare Other | Admitting: Physical Therapy

## 2019-10-06 DIAGNOSIS — M6281 Muscle weakness (generalized): Secondary | ICD-10-CM

## 2019-10-06 DIAGNOSIS — R2689 Other abnormalities of gait and mobility: Secondary | ICD-10-CM

## 2019-10-06 DIAGNOSIS — R2681 Unsteadiness on feet: Secondary | ICD-10-CM

## 2019-10-06 NOTE — Therapy (Signed)
[facility] [address] [place], [place], [address] Phone: [phone]   Fax:  [phone]  Physical Therapy Treatment  Patient Details  Name: Ann Lopez MRN: [id number] Date of Birth: Dec 17, 1970 Referring Provider (PT): Dr. Leeroy Cha   Encounter Date: 10/06/2019   PT End of Session - 10/06/19 1029    Visit Number 6    Number of Visits 26    Date for PT Re-Evaluation 12/23/19    Authorization Type [employer] Medicare/Medicaid    Authorization Time Period 09/24/2019-12/23/2019    Progress Note Due on Visit 10    PT Start Time 1018    PT Stop Time 1101    PT Time Calculation (min) 43 min    Equipment Utilized During Treatment Gait belt    Activity Tolerance Patient tolerated treatment well   No increase in pain today during session   Behavior During Therapy [facility] for tasks assessed/performed           Past Medical History:  Diagnosis Date  . Allergy   . Anxiety   . Asthma   . Back pain   . Breast cancer ([place])   . Cancer ([place])   . Depression   . Diabetes mellitus   . Headache   . Hyperlipemia   . Osteopenia   . Osteoporosis    osteopenia  . Stroke [facility])     Past Surgical History:  Procedure Laterality Date  . ABDOMINAL HYSTERECTOMY     B oophorectomy for BRCA1 gene  . BREAST SURGERY    . CESAREAN SECTION    . INCISION AND DRAINAGE ABSCESS Left 11/14/2012   Procedure: INCISION AND DRAINAGE ABSCESS LEFT GROIN;  Surgeon: Jamesetta So, MD;  Location: [facility];  Service: General;  Laterality: Left;  . IR ANGIO VERTEBRAL SEL SUBCLAVIAN INNOMINATE UNI R MOD SED  03/02/2019  . IR CT HEAD LTD  03/02/2019  . IR INTRAVSC STENT CERV CAROTID W/O EMB-PROT MOD SED INC ANGIO  03/02/2019  . IR PERCUTANEOUS ART THROMBECTOMY/INFUSION INTRACRANIAL INC DIAG ANGIO  03/02/2019  . LYMPHADENECTOMY    . MASTECTOMY Bilateral   . RADIOLOGY WITH ANESTHESIA N/A 03/02/2019   Procedure: IR WITH ANESTHESIA;  Surgeon: Luanne Bras, MD;  Location: [facility];  Service: Radiology;  Laterality: N/A;    There were no vitals filed for this visit.   Subjective Assessment - 10/06/19 1020    Subjective Patient has no new complaints of pain today. No new falls.    Patient Stated Goals Pt wants all the movement back in L side.    Currently in Pain? No/denies    Pain Score 0-No pain                 [facility] Adult PT Treatment/Exercise - 10/06/19 1022      Transfers   Transfers Sit to Stand    Sit to Stand 5: Supervision;With upper extremity assist;Without upper extremity assist;From bed    Stand to Sit 5: Supervision;Without upper extremity assist;To bed    Number of Reps 2 sets;10 reps    Comments Sit to stand/stand to sit was completed for 10 reps in staggered position with RLE positioned forward. Patient had LOB on first rep but was able to correct herself. Patient then completed 10 reps with her RLE placed on a foam bubble to further promote weight placement on the LLE.      Ambulation/Gait   Ambulation/Gait Yes    Ambulation/Gait Assistance 5: Supervision;6: Modified  independent (Device/Increase time)    Ambulation Distance (Feet) 35 Feet    Assistive device Straight cane    Gait Pattern Step-through pattern;Decreased arm swing - left;Decreased stance time - left;Lateral trunk lean to left    Ambulation Surface Level;Indoor    Gait Comments Pt. was educated on using cane in RUE when grocery shopping, ambulating long distances, etc. Pt. provided verbal understanding with this and performed gait with the cane with decreased trendelenburg pattern      Knee/Hip Exercises: Standing   Other Standing Knee Exercises Pt. completed backward step ups in the parallel bars on a 6 in. step with her LLE. Patient performed 2 sets x 10 reps with RUE support; VC's were needed for pt. to perform step ups with decreased UE use. Pt. also performed hip hiking while standing on the LLE on a 6 in. step in the parallel bars for 1 set  of 10 reps.      Knee/Hip Exercises: Supine   Bridges with Clamshell --    Other Supine Knee/Hip Exercises Pt. performed bridges with hip abduction in hooklying; red theraband added for resistance; 1 set of 10 reps. Tactile cues needed for technique               Balance Exercises - 10/06/19 1119      Balance Exercises: Standing   Stepping Strategy Anterior;Posterior;Foam/compliant surface;UE support;10 reps;Limitations   2 sets   Stepping Strategy Limitations pt. performed on foam beam; required VC's to maintain technique and to be less dependent on her RUE in order to further increase LE strength while challenging balance.    Sidestepping Theraband;Other reps (comment);Upper extremity support;Limitations    Theraband Level (Sidestepping) Level 2 (Red)    Sidestepping Limitations Pt. used RUE support to perform 3 laps in the parallel bars. VC's needed for pt. to keep her toes pointed straight and to avoid external rotation of both hips.    Other Standing Exercises pt. performed 3 way hip, 2 sets of 10 reps while standing on foam beam on her LLE. RUE support used, VC's needed for patient to control and slow down the movement.     Other Standing Exercises Comments This was used in order to promote stabilization and strengthening of the LLE while challenging single leg balance. Also used to promote weightbearing through the LLE.             PT Education - 10/06/19 1028    Education Details what to expect wtih aquatic therapy tomorrow    Person(s) Educated Patient    Methods Explanation;Demonstration;Handout    Comprehension Verbalized understanding;Returned demonstration              PT Short Term Goals - 09/24/19 1448      PT SHORT TERM GOAL #1   Title Pt will be independent with HEP for improved balance, strength, gait.  TARGET 5 weeks for all LTGs: 10/23/2019    Time 5    Period Weeks    Status New      PT SHORT TERM GOAL #2   Title Pt will improve 5x sit<>stand score  to less than or equal to 12 seconds for improved functional strength.    Time 5    Period Weeks    Status New      PT SHORT TERM GOAL #3   Title Pt will improve FGA score to at least 20/30 for decreased fall risk.    Time 5    Period Weeks  Status New      PT SHORT TERM GOAL #4   Title Pt will verbalize understanding of fall prevention in home environment.    Time 5    Period Weeks    Status New             PT Long Term Goals - 09/24/19 1451      PT LONG TERM GOAL #1   Title Pt will be independent with progression of HEP for strength, balance, and gait.  TARGET 11/20/2019    Time 9    Period Weeks    Status New      PT LONG TERM GOAL #2   Title Pt will improve 5x sit<>stand score to less than or equal to 10 seconds for improved functional strength.    Time 9    Period Weeks    Status New      PT LONG TERM GOAL #3   Title Pt will improve Functional Gait Assessment score to at least 22/30 for decreased fall risk.    Time 9    Period Weeks    Status New      PT LONG TERM GOAL #4   Title Pt will improve gait velocity to at least 3 ft/sec for improved gait efficiency for community ambulation.    Time 9    Period Weeks    Status New      PT LONG TERM GOAL #5   Title Pt will ambulate at least 1000 ft, outdoor and indoor surfaces, appropriate device as needed, modified independently, for improved community gait.    Time 9    Period Weeks    Status New      Additional Long Term Goals   Additional Long Term Goals Yes      PT LONG TERM GOAL #6   Title FOTO score to improve by at least 10 % for improved overall reported functional mobility.    Time 9    Period Weeks    Status New                 Plan - 10/06/19 1029    Clinical Impression Statement Today's therapy session placed emphasis on LLE strengthening and balance as well as promoting weight bearing through the LLE. Patient was educated on using a single tip cane when she knows she will be  ambulating for a long distance to avoid creating bad habits/further gait deviations. Patient will benefit from further PT in order to continue to progress toward goals and reach a higher level of activity tolerance.    Personal Factors and Comorbidities Comorbidity 3+    Comorbidities PMH:  diabetes, hyperlipidemia, smoking, breast cancer, R carotid stenosis requiring emergent thrombectomy, anxiety depression    Examination-Activity Limitations Locomotion Level;Transfers;Reach Overhead;Stand;Stairs;Lift;Dressing    Examination-Participation Restrictions Community Activity;Other   caring for grandchild   Stability/Clinical Decision Making Evolving/Moderate complexity    Rehab Potential Good    PT Frequency Other (comment)   1x/wk for 1 week, then 3x/wk for 8 weeks   PT Duration Other (comment)   9 weeks   PT Treatment/Interventions ADLs/Self Care Home Management;Electrical Stimulation;DME Instruction;Gait training;Stair training;Functional mobility training;Therapeutic activities;Therapeutic exercise;Balance training;Neuromuscular re-education;Manual techniques;Orthotic Fit/Training;Patient/family education;Passive range of motion;Aquatic Therapy    PT Next Visit Plan Continue to promote weight bearing, balance and strengthening of the LLE.    Consulted and Agree with Plan of Care Patient           Patient will benefit from skilled therapeutic intervention  in order to improve the following deficits and impairments:  Abnormal gait, Decreased coordination, Difficulty walking, Impaired tone, Impaired UE functional use, Decreased activity tolerance, Decreased balance, Decreased mobility, Decreased strength, Postural dysfunction  Visit Diagnosis: Muscle weakness (generalized)  Other abnormalities of gait and mobility  Unsteadiness on feet     Problem List Patient Active Problem List   Diagnosis Date Noted  . Vascular headache   . Other chronic pain   . Uncontrolled type 2 diabetes  mellitus with hyperglycemia ([place])   . Labile blood glucose   . Panic disorder with agoraphobia and moderate panic attacks   . Major depressive disorder, recurrent episode, moderate ([place])   . MCI (mild cognitive impairment) with memory loss   . Acute ischemic right middle cerebral artery (MCA) stroke ([place]) 03/09/2019  . Left hemiparesis ([place])   . Bradycardia   . Tachypnea   . SIRS (systemic inflammatory response syndrome) ([facility])   . Hypokalemia   . Diabetes mellitus type 2 in obese ([place])   . Tobacco abuse   . History of breast cancer   . Cognitive deficits   . Dysphagia, post-stroke   . Stroke (cerebrum) ([place]) 03/02/2019  . Middle cerebral artery embolism, right 03/02/2019  . Chest pain 12/19/2015  . Numbness 12/19/2015  . Femoral hernia 03/24/2014  . Dyslipidemia 09/10/2011  . BRCA1 positive 09/10/2011  . Breast cancer ([place]) 09/10/2011  . H/O gastric bypass; 05/14/11([facility]) 09/10/2011  . Essential hypertension 10/28/2008  . DEPRESSION/ANXIETY 02/19/2007  . CARPAL TUNNEL SYNDROME, RIGHT 02/19/2007  . ALLERGIC RHINITIS 02/19/2007  . GERD 02/19/2007  . IRRITABLE BOWEL SYNDROME 02/19/2007  . HERPES GENITALIS 12/30/2006  . Diabetes ([place]) 12/30/2006  . OSTEOPENIA 12/30/2006  . ANOMALY, CONGENITAL, NERVOUS SYSTEM NEC 12/30/2006    Harlan Stains 10/06/2019, 11:43 AM  [facility] [address] [place], [place], [address] Phone: [phone]   Fax:  [phone]  Name: Ann Lopez MRN: [id number] Date of Birth: [birth year]  This note has been reviewed and edited by supervising CI.  Willow Ora, PTA, [facility] [address], [place], [place] [address] [phone] 10/06/19, 3:53 PM

## 2019-10-06 NOTE — Patient Instructions (Addendum)
°  Aquatic Therapy: What to Expect! ° °Where:  °[facility] °[address] °[place], [place]  [address] °[phone] ° °NOTE:  You will receive an automated phone message reminding you of your appointment and it will say the appointment is at the Rehab Center on [address]  We are working to fix this- just know that you will meet us at the pool! ° °How to Prepare: °• Please make sure you drink 8 ounces of water about one hour prior to your pool session °• A caregiver MUST attend the entire session with the patient.  The caregiver will be responsible for assisting with dressing as well as any toileting needs.  If the patient will be doing a home program this should likely be the person who will assist as well.  °• Patients must wear either their street shoes or pool shoes until they are ready to enter the pool with the therapist.  Patients must also wear either street shoes or pool shoes once exiting the pool to walk to the locker room.  This will helps us prevent slips and falls.  °• Please arrive 15 minutes early to prepare for your pool therapy session °• Sign in at the front desk on the clipboard marked for [place] °• You may use the locker rooms on your right and then enter directly into the recreation pool (NOT the competition pool) °• Please make sure to attend to any toileting needs prior to entering the pool °• Please be dressed in your swim suit and on the pool deck at least 5 minutes before your appointment °• Once on the pool deck your therapist will ask you to sign the Patient  Consent and Assignment of Benefits form °• Your therapist may take your blood pressure prior to, during and after your session if indicated ° °About the pool  and parking: °1. Entering the pool °Your therapist will assist you; there are multiple ways to enter including stairs with railings, a walk in ramp, a roll in chair and a mechanical lift. Your therapist will determine the most appropriate way for  you. °2. Water temperature is usually between 86-87 degrees °3. There may be other swimmers in the pool at the same time °4. Parking is free: if you arrive and there is a parking attendant please inform them you are there for therapy and do not pay to park. Handicapped parking is located at the front entrance. ° °Contact Info:     Appointments: °[facility]  All sessions are 45 minutes   °[address]  [address]     Please call the [facility] if   °[place], [place]   [address]    you need to cancel or reschedule an appointment.  °[phone]     ° ° ° ° °

## 2019-10-07 ENCOUNTER — Ambulatory Visit: Payer: Medicare Other | Admitting: Physical Therapy

## 2019-10-07 ENCOUNTER — Encounter: Payer: Self-pay | Admitting: Physical Therapy

## 2019-10-07 DIAGNOSIS — R2681 Unsteadiness on feet: Secondary | ICD-10-CM

## 2019-10-07 DIAGNOSIS — R293 Abnormal posture: Secondary | ICD-10-CM

## 2019-10-07 DIAGNOSIS — R2689 Other abnormalities of gait and mobility: Secondary | ICD-10-CM | POA: Diagnosis not present

## 2019-10-07 DIAGNOSIS — M6281 Muscle weakness (generalized): Secondary | ICD-10-CM

## 2019-10-07 NOTE — Therapy (Signed)
[facility] [address] [place], [place], [address] Phone: [phone]   Fax:  [phone]  Physical Therapy Treatment  Patient Details  Name: Ann Lopez MRN: [id number] Date of Birth: 1971-01-16 Referring Provider (PT): Dr. Leeroy Cha   Encounter Date: 10/07/2019   PT End of Session - 10/07/19 1830    Visit Number 7    Number of Visits 26    Date for PT Re-Evaluation 12/23/19    Authorization Type UHC Medicare/Medicaid    Authorization Time Period 09/24/2019-12/23/2019    Progress Note Due on Visit 10    PT Start Time 1500    PT Stop Time 1545    PT Time Calculation (min) 45 min    Equipment Utilized During Treatment --   pool noodle   Activity Tolerance Patient tolerated treatment well   No increase in pain today during session   Behavior During Therapy [facility] for tasks assessed/performed           Past Medical History:  Diagnosis Date  . Allergy   . Anxiety   . Asthma   . Back pain   . Breast cancer ([place])   . Cancer ([place])   . Depression   . Diabetes mellitus   . Headache   . Hyperlipemia   . Osteopenia   . Osteoporosis    osteopenia  . Stroke [facility])     Past Surgical History:  Procedure Laterality Date  . ABDOMINAL HYSTERECTOMY     B oophorectomy for BRCA1 gene  . BREAST SURGERY    . CESAREAN SECTION    . INCISION AND DRAINAGE ABSCESS Left 11/14/2012   Procedure: INCISION AND DRAINAGE ABSCESS LEFT GROIN;  Surgeon: Jamesetta So, MD;  Location: [facility];  Service: General;  Laterality: Left;  . IR ANGIO VERTEBRAL SEL SUBCLAVIAN INNOMINATE UNI R MOD SED  03/02/2019  . IR CT HEAD LTD  03/02/2019  . IR INTRAVSC STENT CERV CAROTID W/O EMB-PROT MOD SED INC ANGIO  03/02/2019  . IR PERCUTANEOUS ART THROMBECTOMY/INFUSION INTRACRANIAL INC DIAG ANGIO  03/02/2019  . LYMPHADENECTOMY    . MASTECTOMY Bilateral   . RADIOLOGY WITH ANESTHESIA N/A 03/02/2019   Procedure: IR WITH ANESTHESIA;  Surgeon:  Luanne Bras, MD;  Location: [facility];  Service: Radiology;  Laterality: N/A;    There were no vitals filed for this visit.   Subjective Assessment - 10/07/19 1828    Subjective Denies any falls or changes since last land appointment.  Wants to get her L side moving normally.    Patient Stated Goals Pt wants all the movement back in L side.    Currently in Pain? No/denies           Aquatic therapy at [facility] - pool temp 87.4 degrees   Patient seen for aquatic therapy today.  Treatment took place in water 4 feet deep depending upon activity.  Pt entered and exited the pool via steps with use of right hand rail with SBA.  Pt performed hamstring stretch (runner's stretch) for each leg - 1 rep each - 30 sec hold  Seated on pool bench to perform sit<>stand x 10 reps with 1 UE support.  In seated position performed Ai Chi postures of floating, enclosing and uplifting x 10 reps each with decreased AROM on L.  Pt challenged by buoyancy of water to maintain sitting on bench.  Pt gait trained 35macross pool 2 reps, backwards amb 286m 2, sideways amb. 2596m  2  Reps  Squats x 10 reps with 1 UE support on pool wall:  Unilateral squats on each leg - 10 reps RLE, 10 reps LLE with 1 UE support for closed chain strengthening   Standing with 1 UE support of pool edge for LLE hip flexion, extension, hip flexion moving into extension, hip abd, hip add with pool wall as target, SLR, LE circles both directions-all x 10 reps of each.  Forward step weight shift x 10 reps with RLE then with LLE with 1 UE support of wall.   Pt requires the buoyancy of water for active assisted exercises with buoyancy supported for strengthening & ROM exercises: pt requires the viscosity of the water for resistance with strengthening exercises Gait training can be performed in water with minimal fall risk in the water  Water current provides perturbations which challenge balance with activities.      PT Short Term  Goals - 09/24/19 1448      PT SHORT TERM GOAL #1   Title Pt will be independent with HEP for improved balance, strength, gait.  TARGET 5 weeks for all LTGs: 10/23/2019    Time 5    Period Weeks    Status New      PT SHORT TERM GOAL #2   Title Pt will improve 5x sit<>stand score to less than or equal to 12 seconds for improved functional strength.    Time 5    Period Weeks    Status New      PT SHORT TERM GOAL #3   Title Pt will improve FGA score to at least 20/30 for decreased fall risk.    Time 5    Period Weeks    Status New      PT SHORT TERM GOAL #4   Title Pt will verbalize understanding of fall prevention in home environment.    Time 5    Period Weeks    Status New             PT Long Term Goals - 09/24/19 1451      PT LONG TERM GOAL #1   Title Pt will be independent with progression of HEP for strength, balance, and gait.  TARGET 11/20/2019    Time 9    Period Weeks    Status New      PT LONG TERM GOAL #2   Title Pt will improve 5x sit<>stand score to less than or equal to 10 seconds for improved functional strength.    Time 9    Period Weeks    Status New      PT LONG TERM GOAL #3   Title Pt will improve Functional Gait Assessment score to at least 22/30 for decreased fall risk.    Time 9    Period Weeks    Status New      PT LONG TERM GOAL #4   Title Pt will improve gait velocity to at least 3 ft/sec for improved gait efficiency for community ambulation.    Time 9    Period Weeks    Status New      PT LONG TERM GOAL #5   Title Pt will ambulate at least 1000 ft, outdoor and indoor surfaces, appropriate device as needed, modified independently, for improved community gait.    Time 9    Period Weeks    Status New      Additional Long Term Goals   Additional Long Term Goals Yes  PT LONG TERM GOAL #6   Title FOTO score to improve by at least 10 % for improved overall reported functional mobility.    Time 9    Period Weeks    Status New                   Plan - 10/07/19 1830    Clinical Impression Statement Pt tolerated first aquatic session well with minimal rest breaks needed and worked hard during session.  Pt with decreased foot clearance on L with ankle eversion present along with decreased dorsiflexion.  Lateral lean to R to clear foot.  Limited on LUE due to pain.  Awaiting MRI results from ortho md of L shoulder.  Cont per POC.    Personal Factors and Comorbidities Comorbidity 3+    Comorbidities PMH:  diabetes, hyperlipidemia, smoking, breast cancer, R carotid stenosis requiring emergent thrombectomy, anxiety depression    Examination-Activity Limitations Locomotion Level;Transfers;Reach Overhead;Stand;Stairs;Lift;Dressing    Examination-Participation Restrictions Community Activity;Other   caring for grandchild   Stability/Clinical Decision Making Evolving/Moderate complexity    Rehab Potential Good    PT Frequency Other (comment)   1x/wk for 1 week, then 3x/wk for 8 weeks   PT Duration Other (comment)   9 weeks   PT Treatment/Interventions ADLs/Self Care Home Management;Electrical Stimulation;DME Instruction;Gait training;Stair training;Functional mobility training;Therapeutic activities;Therapeutic exercise;Balance training;Neuromuscular re-education;Manual techniques;Orthotic Fit/Training;Patient/family education;Passive range of motion;Aquatic Therapy    PT Next Visit Plan Continue to promote weight bearing, balance and strengthening of the LLE.    Consulted and Agree with Plan of Care Patient           Patient will benefit from skilled therapeutic intervention in order to improve the following deficits and impairments:  Abnormal gait, Decreased coordination, Difficulty walking, Impaired tone, Impaired UE functional use, Decreased activity tolerance, Decreased balance, Decreased mobility, Decreased strength, Postural dysfunction  Visit Diagnosis: Muscle weakness (generalized)  Other abnormalities of gait  and mobility  Unsteadiness on feet  Abnormal posture     Problem List Patient Active Problem List   Diagnosis Date Noted  . Vascular headache   . Other chronic pain   . Uncontrolled type 2 diabetes mellitus with hyperglycemia ([place])   . Labile blood glucose   . Panic disorder with agoraphobia and moderate panic attacks   . Major depressive disorder, recurrent episode, moderate ([place])   . MCI (mild cognitive impairment) with memory loss   . Acute ischemic right middle cerebral artery (MCA) stroke ([place]) 03/09/2019  . Left hemiparesis ([place])   . Bradycardia   . Tachypnea   . SIRS (systemic inflammatory response syndrome) ([facility])   . Hypokalemia   . Diabetes mellitus type 2 in obese ([place])   . Tobacco abuse   . History of breast cancer   . Cognitive deficits   . Dysphagia, post-stroke   . Stroke (cerebrum) ([place]) 03/02/2019  . Middle cerebral artery embolism, right 03/02/2019  . Chest pain 12/19/2015  . Numbness 12/19/2015  . Femoral hernia 03/24/2014  . Dyslipidemia 09/10/2011  . BRCA1 positive 09/10/2011  . Breast cancer ([place]) 09/10/2011  . H/O gastric bypass; 05/14/11([facility]) 09/10/2011  . Essential hypertension 10/28/2008  . DEPRESSION/ANXIETY 02/19/2007  . CARPAL TUNNEL SYNDROME, RIGHT 02/19/2007  . ALLERGIC RHINITIS 02/19/2007  . GERD 02/19/2007  . IRRITABLE BOWEL SYNDROME 02/19/2007  . HERPES GENITALIS 12/30/2006  . Diabetes ([place]) 12/30/2006  . OSTEOPENIA 12/30/2006  . ANOMALY, CONGENITAL, NERVOUS SYSTEM NEC 12/30/2006    Narda Bonds, PTA [place]  10/07/19 6:41 PM Phone: [phone] Fax: [facility] [address] [place], [place], [address] Phone: [phone]   Fax:  [phone]  Name: Ann Lopez MRN: [id number] Date of Birth: [birth year]

## 2019-10-09 ENCOUNTER — Other Ambulatory Visit: Payer: Self-pay

## 2019-10-09 ENCOUNTER — Ambulatory Visit: Payer: Medicare Other | Attending: Physical Medicine and Rehabilitation

## 2019-10-09 DIAGNOSIS — R278 Other lack of coordination: Secondary | ICD-10-CM | POA: Diagnosis present

## 2019-10-09 DIAGNOSIS — R293 Abnormal posture: Secondary | ICD-10-CM | POA: Diagnosis present

## 2019-10-09 DIAGNOSIS — M6281 Muscle weakness (generalized): Secondary | ICD-10-CM | POA: Diagnosis present

## 2019-10-09 DIAGNOSIS — R2689 Other abnormalities of gait and mobility: Secondary | ICD-10-CM | POA: Diagnosis present

## 2019-10-09 DIAGNOSIS — M25512 Pain in left shoulder: Secondary | ICD-10-CM | POA: Insufficient documentation

## 2019-10-09 DIAGNOSIS — R29898 Other symptoms and signs involving the musculoskeletal system: Secondary | ICD-10-CM | POA: Insufficient documentation

## 2019-10-09 DIAGNOSIS — I69354 Hemiplegia and hemiparesis following cerebral infarction affecting left non-dominant side: Secondary | ICD-10-CM | POA: Insufficient documentation

## 2019-10-09 DIAGNOSIS — R29818 Other symptoms and signs involving the nervous system: Secondary | ICD-10-CM | POA: Diagnosis present

## 2019-10-09 DIAGNOSIS — R2681 Unsteadiness on feet: Secondary | ICD-10-CM | POA: Insufficient documentation

## 2019-10-09 NOTE — Therapy (Signed)
[facility] [address] [place], [place], [address] Phone: [phone]   Fax:  [phone]  Physical Therapy Treatment  Patient Details  Name: Ann Lopez MRN: [id number] Date of Birth: 07-26-1970 Referring Provider (PT): Dr. Leeroy Cha   Encounter Date: 10/09/2019   PT End of Session - 10/09/19 1305    Visit Number 8    Number of Visits 26    Date for PT Re-Evaluation 12/23/19    Authorization Type UHC Medicare/Medicaid    Authorization Time Period 09/24/2019-12/23/2019    Progress Note Due on Visit 10    PT Start Time 0933    PT Stop Time 1011    PT Time Calculation (min) 38 min    Equipment Utilized During Treatment --   pool noodle   Activity Tolerance Patient tolerated treatment well   No increase in pain today during session   Behavior During Therapy [facility] for tasks assessed/performed           Past Medical History:  Diagnosis Date  . Allergy   . Anxiety   . Asthma   . Back pain   . Breast cancer ([place])   . Cancer ([place])   . Depression   . Diabetes mellitus   . Headache   . Hyperlipemia   . Osteopenia   . Osteoporosis    osteopenia  . Stroke [facility])     Past Surgical History:  Procedure Laterality Date  . ABDOMINAL HYSTERECTOMY     B oophorectomy for BRCA1 gene  . BREAST SURGERY    . CESAREAN SECTION    . INCISION AND DRAINAGE ABSCESS Left 11/14/2012   Procedure: INCISION AND DRAINAGE ABSCESS LEFT GROIN;  Surgeon: Jamesetta So, MD;  Location: [facility];  Service: General;  Laterality: Left;  . IR ANGIO VERTEBRAL SEL SUBCLAVIAN INNOMINATE UNI R MOD SED  03/02/2019  . IR CT HEAD LTD  03/02/2019  . IR INTRAVSC STENT CERV CAROTID W/O EMB-PROT MOD SED INC ANGIO  03/02/2019  . IR PERCUTANEOUS ART THROMBECTOMY/INFUSION INTRACRANIAL INC DIAG ANGIO  03/02/2019  . LYMPHADENECTOMY    . MASTECTOMY Bilateral   . RADIOLOGY WITH ANESTHESIA N/A 03/02/2019   Procedure: IR WITH ANESTHESIA;  Surgeon:  Luanne Bras, MD;  Location: [facility];  Service: Radiology;  Laterality: N/A;    There were no vitals filed for this visit.   Subjective Assessment - 10/09/19 0936    Subjective Patient reports improvements in pain in her L Shoulder with the aquatics. No pain.    Patient Stated Goals Pt wants all the movement back in L side.    Currently in Pain? No/denies                             [facility] Adult PT Treatment/Exercise - 10/09/19 0944      Transfers   Transfers Sit to Stand    Sit to Stand 5: Supervision    Stand to Sit 5: Supervision    Comments x 5 reps throughout therapy session with activities      Ambulation/Gait   Ambulation/Gait Yes    Ambulation/Gait Assistance 5: Supervision    Ambulation Distance (Feet) 345 Feet   115   Assistive device Straight cane    Gait Pattern Step-through pattern;Decreased arm swing - left;Decreased stance time - left;Lateral trunk lean to left    Ambulation Surface Level;Indoor    Curb 5: Supervision    Curb Details (  indicate cue type and reason) completed ascend/descending curb with SPC. PT providing verbal cues for proper sequencing. x 5 reps.     Gait Comments Completed gait training initially w/o AD, patient demo increased trendelenburg. Then completed gait training w/ SPC x 345 ft w/ patient demo improvements in trendlenburg. PT providing education on use of SPC on outdoor surfaces and prolonged ambulation for improved gait pattern. With ambulation, PT providing tactile cues at trunk for upright posture. At this time, patient reports has not purhcased a cane, but plans to purchase one soon.        Exercises   Exercises Other Exercises    Other Exercises  Completed supine bridge with hip abduction w/ green tband, 2 x 10 reps. cues for technique.       Knee/Hip Exercises: Standing   Hip Abduction Stengthening;Left;2 sets;10 reps    Abduction Limitations completed w/ single UE support.     Other Standing Knee Exercises  Patient completed mini squats w/ single UE support, 2 x 10 reps. Verbal cues for posture and technique with completion.     Other Standing Knee Exercises Completed side stepping with green tband, x 4 laps.       Knee/Hip Exercises: Sidelying   Clams Completed clamshells in R sidelying with green tband, 2 x 10 reps. PT providing tactile/verbal cues to avoid trunk rotation with completion, PT providing stabilization at L hip to maintain R sidelying with completion.                     PT Short Term Goals - 09/24/19 1448      PT SHORT TERM GOAL #1   Title Pt will be independent with HEP for improved balance, strength, gait.  TARGET 5 weeks for all LTGs: 10/23/2019    Time 5    Period Weeks    Status New      PT SHORT TERM GOAL #2   Title Pt will improve 5x sit<>stand score to less than or equal to 12 seconds for improved functional strength.    Time 5    Period Weeks    Status New      PT SHORT TERM GOAL #3   Title Pt will improve FGA score to at least 20/30 for decreased fall risk.    Time 5    Period Weeks    Status New      PT SHORT TERM GOAL #4   Title Pt will verbalize understanding of fall prevention in home environment.    Time 5    Period Weeks    Status New             PT Long Term Goals - 09/24/19 1451      PT LONG TERM GOAL #1   Title Pt will be independent with progression of HEP for strength, balance, and gait.  TARGET 11/20/2019    Time 9    Period Weeks    Status New      PT LONG TERM GOAL #2   Title Pt will improve 5x sit<>stand score to less than or equal to 10 seconds for improved functional strength.    Time 9    Period Weeks    Status New      PT LONG TERM GOAL #3   Title Pt will improve Functional Gait Assessment score to at least 22/30 for decreased fall risk.    Time 9    Period Weeks    Status New  PT LONG TERM GOAL #4   Title Pt will improve gait velocity to at least 3 ft/sec for improved gait efficiency for community  ambulation.    Time 9    Period Weeks    Status New      PT LONG TERM GOAL #5   Title Pt will ambulate at least 1000 ft, outdoor and indoor surfaces, appropriate device as needed, modified independently, for improved community gait.    Time 9    Period Weeks    Status New      Additional Long Term Goals   Additional Long Term Goals Yes      PT LONG TERM GOAL #6   Title FOTO score to improve by at least 10 % for improved overall reported functional mobility.    Time 9    Period Weeks    Status New                 Plan - 10/09/19 1306    Clinical Impression Statement Today's session included continued strengthening focused on LLE, with patient tolerating well. Continued gait training with SPC, with patient demo improved gait pattern and decreased trendelenburg with device at this time. Patient will continue to benefit from skilled PT services to progress toward goals    Personal Factors and Comorbidities Comorbidity 3+    Comorbidities PMH:  diabetes, hyperlipidemia, smoking, breast cancer, R carotid stenosis requiring emergent thrombectomy, anxiety depression    Examination-Activity Limitations Locomotion Level;Transfers;Reach Overhead;Stand;Stairs;Lift;Dressing    Examination-Participation Restrictions Community Activity;Other   caring for grandchild   Stability/Clinical Decision Making Evolving/Moderate complexity    Rehab Potential Good    PT Frequency Other (comment)   1x/wk for 1 week, then 3x/wk for 8 weeks   PT Duration Other (comment)   9 weeks   PT Treatment/Interventions ADLs/Self Care Home Management;Electrical Stimulation;DME Instruction;Gait training;Stair training;Functional mobility training;Therapeutic activities;Therapeutic exercise;Balance training;Neuromuscular re-education;Manual techniques;Orthotic Fit/Training;Patient/family education;Passive range of motion;Aquatic Therapy    PT Next Visit Plan Continue to promote weight bearing, balance and strengthening  of the LLE.    Consulted and Agree with Plan of Care Patient           Patient will benefit from skilled therapeutic intervention in order to improve the following deficits and impairments:  Abnormal gait, Decreased coordination, Difficulty walking, Impaired tone, Impaired UE functional use, Decreased activity tolerance, Decreased balance, Decreased mobility, Decreased strength, Postural dysfunction  Visit Diagnosis: Other abnormalities of gait and mobility  Muscle weakness (generalized)  Unsteadiness on feet  Hemiplegia and hemiparesis following cerebral infarction affecting left non-dominant side [facility])     Problem List Patient Active Problem List   Diagnosis Date Noted  . Vascular headache   . Other chronic pain   . Uncontrolled type 2 diabetes mellitus with hyperglycemia ([place])   . Labile blood glucose   . Panic disorder with agoraphobia and moderate panic attacks   . Major depressive disorder, recurrent episode, moderate ([place])   . MCI (mild cognitive impairment) with memory loss   . Acute ischemic right middle cerebral artery (MCA) stroke ([place]) 03/09/2019  . Left hemiparesis ([place])   . Bradycardia   . Tachypnea   . SIRS (systemic inflammatory response syndrome) ([facility])   . Hypokalemia   . Diabetes mellitus type 2 in obese ([place])   . Tobacco abuse   . History of breast cancer   . Cognitive deficits   . Dysphagia, post-stroke   . Stroke (cerebrum) ([place]) 03/02/2019  . Middle cerebral artery embolism, right  03/02/2019  . Chest pain 12/19/2015  . Numbness 12/19/2015  . Femoral hernia 03/24/2014  . Dyslipidemia 09/10/2011  . BRCA1 positive 09/10/2011  . Breast cancer ([place]) 09/10/2011  . H/O gastric bypass; 05/14/11([facility]) 09/10/2011  . Essential hypertension 10/28/2008  . DEPRESSION/ANXIETY 02/19/2007  . CARPAL TUNNEL SYNDROME, RIGHT 02/19/2007  . ALLERGIC RHINITIS 02/19/2007  . GERD 02/19/2007  . IRRITABLE BOWEL SYNDROME 02/19/2007  . HERPES GENITALIS 12/30/2006  .  Diabetes ([place]) 12/30/2006  . OSTEOPENIA 12/30/2006  . ANOMALY, CONGENITAL, NERVOUS SYSTEM NEC 12/30/2006    Jones Bales, PT, DPT 10/09/2019, 1:09 PM  [facility] [address] [place], [place], [address] Phone: [phone]   Fax:  [phone]  Name: DOMENIQUE QUEST MRN: [id number] Date of Birth: [birth year]

## 2019-10-11 ENCOUNTER — Other Ambulatory Visit: Payer: Self-pay | Admitting: Physical Medicine and Rehabilitation

## 2019-10-13 ENCOUNTER — Other Ambulatory Visit: Payer: Self-pay

## 2019-10-13 ENCOUNTER — Ambulatory Visit: Payer: Medicare Other | Admitting: Physical Therapy

## 2019-10-13 DIAGNOSIS — R2689 Other abnormalities of gait and mobility: Secondary | ICD-10-CM

## 2019-10-13 DIAGNOSIS — M6281 Muscle weakness (generalized): Secondary | ICD-10-CM

## 2019-10-13 NOTE — Telephone Encounter (Signed)
Patient needs follow-up appointment before I can continue to prescribe this medication. Thank you!

## 2019-10-14 ENCOUNTER — Ambulatory Visit: Payer: Medicare Other | Admitting: Physical Therapy

## 2019-10-14 DIAGNOSIS — R2689 Other abnormalities of gait and mobility: Secondary | ICD-10-CM | POA: Diagnosis not present

## 2019-10-14 DIAGNOSIS — R2681 Unsteadiness on feet: Secondary | ICD-10-CM

## 2019-10-14 DIAGNOSIS — R293 Abnormal posture: Secondary | ICD-10-CM

## 2019-10-14 DIAGNOSIS — M6281 Muscle weakness (generalized): Secondary | ICD-10-CM

## 2019-10-14 NOTE — Therapy (Signed)
[facility] [address] [place], [place], [address] Phone: [phone]   Fax:  [phone]  Physical Therapy Treatment/10th Visit Progress Note  Patient Details  Name: Ann Lopez MRN: [id number] Date of Birth: Mar 03, 1971 Referring Provider (PT): Dr. Leeroy Cha   Encounter Date: 10/14/2019   PT End of Session - 10/14/19 1804    Visit Number 10    Number of Visits 26    Date for PT Re-Evaluation 12/23/19    Authorization Type UHC Medicare/Medicaid    Authorization Time Period 09/24/2019-12/23/2019    Progress Note Due on Visit 20    PT Start Time 1702    PT Stop Time 1748    PT Time Calculation (min) 46 min    Activity Tolerance Patient tolerated treatment well;Patient limited by pain   Pt with reports of back pain at beginning of session; slight increase at end of session   Behavior During Therapy [facility] for tasks assessed/performed           Past Medical History:  Diagnosis Date  . Allergy   . Anxiety   . Asthma   . Back pain   . Breast cancer ([place])   . Cancer ([place])   . Depression   . Diabetes mellitus   . Headache   . Hyperlipemia   . Osteopenia   . Osteoporosis    osteopenia  . Stroke [facility])     Past Surgical History:  Procedure Laterality Date  . ABDOMINAL HYSTERECTOMY     B oophorectomy for BRCA1 gene  . BREAST SURGERY    . CESAREAN SECTION    . INCISION AND DRAINAGE ABSCESS Left 11/14/2012   Procedure: INCISION AND DRAINAGE ABSCESS LEFT GROIN;  Surgeon: Jamesetta So, MD;  Location: [facility];  Service: General;  Laterality: Left;  . IR ANGIO VERTEBRAL SEL SUBCLAVIAN INNOMINATE UNI R MOD SED  03/02/2019  . IR CT HEAD LTD  03/02/2019  . IR INTRAVSC STENT CERV CAROTID W/O EMB-PROT MOD SED INC ANGIO  03/02/2019  . IR PERCUTANEOUS ART THROMBECTOMY/INFUSION INTRACRANIAL INC DIAG ANGIO  03/02/2019  . LYMPHADENECTOMY    . MASTECTOMY Bilateral   . RADIOLOGY WITH ANESTHESIA N/A 03/02/2019    Procedure: IR WITH ANESTHESIA;  Surgeon: Luanne Bras, MD;  Location: [facility];  Service: Radiology;  Laterality: N/A;    There were no vitals filed for this visit.   Subjective Assessment - 10/14/19 1703    Subjective My back is hurting today-probably from standing and folding clothes earlier today.    Patient Stated Goals Pt wants all the movement back in L side.    Currently in Pain? Yes    Pain Score 5     Pain Location Back    Pain Orientation Left;Lower    Pain Descriptors / Indicators Throbbing    Pain Type Chronic pain    Pain Frequency Intermittent    Aggravating Factors  certain movements, standing too long    Pain Relieving Factors sitting still                             [facility] Adult PT Treatment/Exercise - 10/14/19 0001      Transfers   Transfers Sit to Stand    Sit to Stand 5: Supervision    Five time sit to stand comments  13.47    Stand to Sit 5: Supervision      High Level Balance  High Level Balance Comments Attempted SLS LLE:  pt able to hold 2.5 seconds at best, with strong lateral trunk lean, no UE support; multiple trials.      Neuro Re-ed    Neuro Re-ed Details  NMR to lower leg and ankle musculature to encourage activation of L ankle muscles:  standing on rockerboard:  anterior/posterior direction pushing through lower legs, 2 sets x 10 reps with intermittent RUE support, then standing steady on rockerboard with EO head turns x 5, head nods x 5, then EC x 10 seconds, 2 reps, RUE support.  Then performed with rockerboard in lateral position, x 10 reps rocking, then steady with EO head turns/nods x 5 reps, then EC 10 sec hold.  Progressed to standing on balance beam with anterior/posterior weightshift for ankle activation x 10 reps, then forward step taps x 10, back step taps x 10, side step taps x 8 reps.      Exercises   Exercises Other Exercises    Other Exercises  Trunk strengthening exercises in tall kneel position with mat in front  for support:  tall kneel>sit back on heels x 10 reps, cues to come tall and for glut/abdominal activation in return to tall kneeling.  Tall kneel with gentle UE lifts from mat x 5 reps, then rolling ball on mat 2 sets x 5 reps, cues for abdominal activation and upright posture and to lower R shoulder.  Tall kneeling with head turns, head nods x 5 reps wtih cues for abdominal activation to stay tall through L trunk, then tall kneel to 1/2 kneel x 3 reps each leg-cues for staying tall through L trunk (she tends to hold RUE on mat, elevating R shoulder and laterally flexing L trunk).      Ankle Exercises: Seated   Toe Raise 10 reps;3 seconds    Other Seated Ankle Exercises Performed ankle eversion, LLE 2 sets x 10 reps no resistance, cues for technique and 3 sec hold.             Standing positioning (to try to help offset back pain and for better standing positioning in general):  Widened BOS lateral weightshifting x 5 reps, 2 sets, then stagger stance position diagonal rocking, 5 reps each position.       PT Education - 10/14/19 1803    Education Details Additions to HEP-see instructions    Person(s) Educated Patient    Methods Explanation;Demonstration;Handout;Verbal cues    Comprehension Verbalized understanding;Returned demonstration;Verbal cues required;Need further instruction            PT Short Term Goals - 09/24/19 1448      PT SHORT TERM GOAL #1   Title Pt will be independent with HEP for improved balance, strength, gait.  TARGET 5 weeks for all LTGs: 10/23/2019    Time 5    Period Weeks    Status New      PT SHORT TERM GOAL #2   Title Pt will improve 5x sit<>stand score to less than or equal to 12 seconds for improved functional strength.    Time 5    Period Weeks    Status New      PT SHORT TERM GOAL #3   Title Pt will improve FGA score to at least 20/30 for decreased fall risk.    Time 5    Period Weeks    Status New      PT SHORT TERM GOAL #4   Title Pt will  verbalize understanding of  fall prevention in home environment.    Time 5    Period Weeks    Status New             PT Long Term Goals - 09/24/19 1451      PT LONG TERM GOAL #1   Title Pt will be independent with progression of HEP for strength, balance, and gait.  TARGET 11/20/2019    Time 9    Period Weeks    Status New      PT LONG TERM GOAL #2   Title Pt will improve 5x sit<>stand score to less than or equal to 10 seconds for improved functional strength.    Time 9    Period Weeks    Status New      PT LONG TERM GOAL #3   Title Pt will improve Functional Gait Assessment score to at least 22/30 for decreased fall risk.    Time 9    Period Weeks    Status New      PT LONG TERM GOAL #4   Title Pt will improve gait velocity to at least 3 ft/sec for improved gait efficiency for community ambulation.    Time 9    Period Weeks    Status New      PT LONG TERM GOAL #5   Title Pt will ambulate at least 1000 ft, outdoor and indoor surfaces, appropriate device as needed, modified independently, for improved community gait.    Time 9    Period Weeks    Status New      Additional Long Term Goals   Additional Long Term Goals Yes      PT LONG TERM GOAL #6   Title FOTO score to improve by at least 10 % for improved overall reported functional mobility.    Time 9    Period Weeks    Status New                 Plan - 10/14/19 1806    Clinical Impression Statement 10th Visit Progress Note, covering dates 09/24/2019-10/14/2019:  Subjective reports:  pt reports continued occasional back pain (has hx of back pain) and LUE pain.  Reports no falls.  Objective measures:  5x sit<>stand:  13.47 sec (improved from 14.19 sec at eval).  SLS on LLE:  2.5 sec at best with significant L lateral trunk lean after multiple attempts.  Pt demonstrates left lateral trunk lean, Trendelenburg gait pattern on LLE with gait, decreased reciprocal arm swing.  She reports L toes curling with attempts at  some balance and SLS exercises.  It appears that pt has increase in tone and syngery patterns in LUE and LLE with balance and strength training challenges.  She is progressing towards STGs, which will be assessed next week.  She will continue to benefit from continued skilled PT to address strength, balance, and gait towards goals.    Personal Factors and Comorbidities Comorbidity 3+    Comorbidities PMH:  diabetes, hyperlipidemia, smoking, breast cancer, R carotid stenosis requiring emergent thrombectomy, anxiety depression    Examination-Activity Limitations Locomotion Level;Transfers;Reach Overhead;Stand;Stairs;Lift;Dressing    Examination-Participation Restrictions Community Activity;Other   caring for grandchild   Stability/Clinical Decision Making Evolving/Moderate complexity    Rehab Potential Good    PT Frequency Other (comment)   1x/wk for 1 week, then 3x/wk for 8 weeks   PT Duration Other (comment)   9 weeks   PT Treatment/Interventions ADLs/Self Care Home Management;Electrical Stimulation;DME Instruction;Gait training;Stair training;Functional mobility  training;Therapeutic activities;Therapeutic exercise;Balance training;Neuromuscular re-education;Manual techniques;Orthotic Fit/Training;Patient/family education;Passive range of motion;Aquatic Therapy    PT Next Visit Plan Continue to promote weight bearing, balance and strengthening of the LLE and elongation of L trunk; try SLS supported activities-using mirror for upright trunk and decreased RUE support; review updates to HEP (would foot-up brace be helpful with timing and coordination of gait?)    Consulted and Agree with Plan of Care Patient           Patient will benefit from skilled therapeutic intervention in order to improve the following deficits and impairments:  Abnormal gait, Decreased coordination, Difficulty walking, Impaired tone, Impaired UE functional use, Decreased activity tolerance, Decreased balance, Decreased  mobility, Decreased strength, Postural dysfunction  Visit Diagnosis: Muscle weakness (generalized)  Abnormal posture  Unsteadiness on feet  Other abnormalities of gait and mobility     Problem List Patient Active Problem List   Diagnosis Date Noted  . Vascular headache   . Other chronic pain   . Uncontrolled type 2 diabetes mellitus with hyperglycemia ([place])   . Labile blood glucose   . Panic disorder with agoraphobia and moderate panic attacks   . Major depressive disorder, recurrent episode, moderate ([place])   . MCI (mild cognitive impairment) with memory loss   . Acute ischemic right middle cerebral artery (MCA) stroke ([place]) 03/09/2019  . Left hemiparesis ([place])   . Bradycardia   . Tachypnea   . SIRS (systemic inflammatory response syndrome) ([facility])   . Hypokalemia   . Diabetes mellitus type 2 in obese ([place])   . Tobacco abuse   . History of breast cancer   . Cognitive deficits   . Dysphagia, post-stroke   . Stroke (cerebrum) ([place]) 03/02/2019  . Middle cerebral artery embolism, right 03/02/2019  . Chest pain 12/19/2015  . Numbness 12/19/2015  . Femoral hernia 03/24/2014  . Dyslipidemia 09/10/2011  . BRCA1 positive 09/10/2011  . Breast cancer ([place]) 09/10/2011  . H/O gastric bypass; 05/14/11([facility]) 09/10/2011  . Essential hypertension 10/28/2008  . DEPRESSION/ANXIETY 02/19/2007  . CARPAL TUNNEL SYNDROME, RIGHT 02/19/2007  . ALLERGIC RHINITIS 02/19/2007  . GERD 02/19/2007  . IRRITABLE BOWEL SYNDROME 02/19/2007  . HERPES GENITALIS 12/30/2006  . Diabetes ([place]) 12/30/2006  . OSTEOPENIA 12/30/2006  . ANOMALY, CONGENITAL, NERVOUS SYSTEM NEC 12/30/2006    Kolby Schara W. 10/14/2019, 6:12 PM  Frazier Butt., PT   [facility] [address] [place], [place], [address] Phone: [phone]   Fax:  [phone]  Name: Ann Lopez MRN: [id number] Date of Birth: September 25, 1970

## 2019-10-14 NOTE — Patient Instructions (Addendum)
Access Code: [email or web address] URL: [email or web address] Date: 10/14/2019 Prepared by: Mady Haagensen  Exercises Seated Pelvic Tilt - 1-2 x daily - 5 x weekly - 1-2 sets - 10 reps Supine Lower Trunk Rotation - 1 x daily - 7 x weekly - 1 sets - 3 reps - 30 sec hold Hooklying Isometric Clamshell - 1 x daily - 5 x weekly - 1-2 sets - 10 reps Supine Bridge - 1 x daily - 5 x weekly - 1-2 sets - 10 reps Sit to stand in stride stance - 1 x daily - 7 x weekly - 1-2 sets - 10 reps Standing March with Counter Support - 1 x daily - 7 x weekly - 3 sets - 10 reps  Added 10/14/2019 Seated Ankle Dorsiflexion AROM - 1 x daily - 7 x weekly - 2-3 sets - 10 reps - 3 sec hold Seated Ankle Eversion AROM - 1 x daily - 7 x weekly - 2-3 sets - 10 reps - 3 sec hold Side to side weightshift - 1 x daily - 7 x weekly - 1-2 sets - 10 reps Stride Stance Weight Shift - 1 x daily - 7 x weekly - 1-2 sets - 10 reps

## 2019-10-14 NOTE — Therapy (Signed)
[facility] [address] [place], [place], [address] Phone: [phone]   Fax:  [phone]  Physical Therapy Treatment  Patient Details  Name: Ann Lopez MRN: [id number] Date of Birth: 13-Jan-1971 Referring Provider (PT): Dr. Leeroy Cha   Encounter Date: 10/13/2019   PT End of Session - 10/14/19 0913    Visit Number 9    Number of Visits 26    Date for PT Re-Evaluation 12/23/19    Authorization Type UHC Medicare/Medicaid    Authorization Time Period 09/24/2019-12/23/2019    Progress Note Due on Visit 10    PT Start Time 0805    PT Stop Time 0845    PT Time Calculation (min) 40 min    Activity Tolerance Patient tolerated treatment well   No increase in pain today during session; fatigued at end of session   Behavior During Therapy [facility] for tasks assessed/performed           Past Medical History:  Diagnosis Date   Allergy    Anxiety    Asthma    Back pain    Breast cancer ([place])    Cancer ([place])    Depression    Diabetes mellitus    Headache    Hyperlipemia    Osteopenia    Osteoporosis    osteopenia   Stroke [facility])     Past Surgical History:  Procedure Laterality Date   ABDOMINAL HYSTERECTOMY     B oophorectomy for BRCA1 gene   BREAST SURGERY     CESAREAN SECTION     INCISION AND DRAINAGE ABSCESS Left 11/14/2012   Procedure: INCISION AND DRAINAGE ABSCESS LEFT GROIN;  Surgeon: Jamesetta So, MD;  Location: [facility];  Service: General;  Laterality: Left;   IR ANGIO VERTEBRAL SEL SUBCLAVIAN INNOMINATE UNI R MOD SED  03/02/2019   IR CT HEAD LTD  03/02/2019   IR INTRAVSC STENT CERV CAROTID W/O EMB-PROT MOD SED INC ANGIO  03/02/2019   IR PERCUTANEOUS ART THROMBECTOMY/INFUSION INTRACRANIAL INC DIAG ANGIO  03/02/2019   LYMPHADENECTOMY     MASTECTOMY Bilateral    RADIOLOGY WITH ANESTHESIA N/A 03/02/2019   Procedure: IR WITH ANESTHESIA;  Surgeon: Luanne Bras, MD;  Location:  [place];  Service: Radiology;  Laterality: N/A;    There were no vitals filed for this visit.   Subjective Assessment - 10/13/19 0807    Subjective Got the MRI done last Friday, and he (orthopedic MD, Delilah Shan) thinks it's frozen shoulder.  To go for nerve block prior to OT.    Patient Stated Goals Pt wants all the movement back in L side.    Currently in Pain? No/denies                             [facility] Adult PT Treatment/Exercise - 10/13/19 0809      Ambulation/Gait   Ambulation/Gait Yes    Ambulation/Gait Assistance 5: Supervision    Ambulation/Gait Assistance Details Started with SPC, but transitioned to straight cane with small 4-point tip.  Cues provided throughout for cane sequence, step length, avoiding placing cane too far in front    Ambulation Distance (Feet) 345 Feet   230   Assistive device Straight cane   with 4-point rubber base   Gait Pattern Step-through pattern;Decreased arm swing - left;Decreased stance time - left;Lateral trunk lean to left;Trendelenburg    Ambulation Surface Level;Indoor    Pre-Gait Activities Additional short distance  gait between activities in session, no device; PT provides VCs for abdominal activation and upright posture and tactile, verbal cues to relax through LUE.    Gait Comments Tactile cues at times provided through L trunk to help to decreased L lateral trunk lean      Knee/Hip Exercises: Standing   Knee Flexion Right;Left;Strengthening;1 set;10 reps    Hip Abduction Stengthening;Left;2 sets;10 reps    Abduction Limitations 1 UE support at counter, PT provides tactile cues for posture    Hip Extension Stengthening;Right;Left;1 set;10 reps    Extension Limitations 1 UE support at counter, PT provides tactile cues for posture    Lateral Step Up Left;1 set;Step Height: 6";Hand Hold: 1;10 reps    Lateral Step Up Limitations Using walking pole on RUE for support (to avoid trunk rotation trying to hold to rail); pt requires  standing rest break after 5 reps    Forward Step Up Left;1 set;15 reps;Step Height: 6";Hand Hold: 1    Forward Step Up Limitations Cues to prevent L knee recurvatum    Other Standing Knee Exercises Completed resisted side stepping with green tband, x 3 laps.       Knee/Hip Exercises: Sidelying   Hip ABduction Strengthening;Left;2 sets    Hip ABduction Limitations Attempted 1st set with 2# weight, then 2nd set, no weight; cues to maintain proper technique    Hip ADduction --    Clams Completed clamshells in R sidelying with green tband, 3 x 10 reps. PT providing tactile/verbal cues to avoid trunk rotation with completion, PT providing stabilization at L hip to maintain R sidelying with completion.                     PT Short Term Goals - 09/24/19 1448      PT SHORT TERM GOAL #1   Title Pt will be independent with HEP for improved balance, strength, gait.  TARGET 5 weeks for all LTGs: 10/23/2019    Time 5    Period Weeks    Status New      PT SHORT TERM GOAL #2   Title Pt will improve 5x sit<>stand score to less than or equal to 12 seconds for improved functional strength.    Time 5    Period Weeks    Status New      PT SHORT TERM GOAL #3   Title Pt will improve FGA score to at least 20/30 for decreased fall risk.    Time 5    Period Weeks    Status New      PT SHORT TERM GOAL #4   Title Pt will verbalize understanding of fall prevention in home environment.    Time 5    Period Weeks    Status New             PT Long Term Goals - 09/24/19 1451      PT LONG TERM GOAL #1   Title Pt will be independent with progression of HEP for strength, balance, and gait.  TARGET 11/20/2019    Time 9    Period Weeks    Status New      PT LONG TERM GOAL #2   Title Pt will improve 5x sit<>stand score to less than or equal to 10 seconds for improved functional strength.    Time 9    Period Weeks    Status New      PT LONG TERM GOAL #3   Title Pt will  improve Functional  Gait Assessment score to at least 22/30 for decreased fall risk.    Time 9    Period Weeks    Status New      PT LONG TERM GOAL #4   Title Pt will improve gait velocity to at least 3 ft/sec for improved gait efficiency for community ambulation.    Time 9    Period Weeks    Status New      PT LONG TERM GOAL #5   Title Pt will ambulate at least 1000 ft, outdoor and indoor surfaces, appropriate device as needed, modified independently, for improved community gait.    Time 9    Period Weeks    Status New      Additional Long Term Goals   Additional Long Term Goals Yes      PT LONG TERM GOAL #6   Title FOTO score to improve by at least 10 % for improved overall reported functional mobility.    Time 9    Period Weeks    Status New                 Plan - 10/14/19 0914    Clinical Impression Statement Continued focus in today's session on LLE strengthening, postural education during exercises and gait training with cane.  Pt prefers SPC with small 4-point base for more stability; she needs cues for sequencing, but with use of cane, posture is improved and has decreased Trendelenburg pattern.  She is fatigued by end of session and will continue to benefit from skilled PT to address functional deficits towards goals.    Personal Factors and Comorbidities Comorbidity 3+    Comorbidities PMH:  diabetes, hyperlipidemia, smoking, breast cancer, R carotid stenosis requiring emergent thrombectomy, anxiety depression    Examination-Activity Limitations Locomotion Level;Transfers;Reach Overhead;Stand;Stairs;Lift;Dressing    Examination-Participation Restrictions Community Activity;Other   caring for grandchild   Stability/Clinical Decision Making Evolving/Moderate complexity    Rehab Potential Good    PT Frequency Other (comment)   1x/wk for 1 week, then 3x/wk for 8 weeks   PT Duration Other (comment)   9 weeks   PT Treatment/Interventions ADLs/Self Care Home Management;Electrical  Stimulation;DME Instruction;Gait training;Stair training;Functional mobility training;Therapeutic activities;Therapeutic exercise;Balance training;Neuromuscular re-education;Manual techniques;Orthotic Fit/Training;Patient/family education;Passive range of motion;Aquatic Therapy    PT Next Visit Plan Continue to promote weight bearing, balance and strengthening of the LLE; trunk strengthening-consider tall kneel/half-kneel; 10th VISIT PROGRESS NOTE next visit    Consulted and Agree with Plan of Care Patient           Patient will benefit from skilled therapeutic intervention in order to improve the following deficits and impairments:  Abnormal gait, Decreased coordination, Difficulty walking, Impaired tone, Impaired UE functional use, Decreased activity tolerance, Decreased balance, Decreased mobility, Decreased strength, Postural dysfunction  Visit Diagnosis: Muscle weakness (generalized)  Other abnormalities of gait and mobility     Problem List Patient Active Problem List   Diagnosis Date Noted   Vascular headache    Other chronic pain    Uncontrolled type 2 diabetes mellitus with hyperglycemia ([facility])    Labile blood glucose    Panic disorder with agoraphobia and moderate panic attacks    Major depressive disorder, recurrent episode, moderate ([facility])    MCI (mild cognitive impairment) with memory loss    Acute ischemic right middle cerebral artery (MCA) stroke ([place]) 03/09/2019   Left hemiparesis ([facility])    Bradycardia    Tachypnea    SIRS (systemic inflammatory response syndrome) (  [place])    Hypokalemia    Diabetes mellitus type 2 in obese [facility])    Tobacco abuse    History of breast cancer    Cognitive deficits    Dysphagia, post-stroke    Stroke (cerebrum) ([place]) 03/02/2019   Middle cerebral artery embolism, right 03/02/2019   Chest pain 12/19/2015   Numbness 12/19/2015   Femoral hernia 03/24/2014   Dyslipidemia 09/10/2011   BRCA1 positive 09/10/2011     Breast cancer ([place]) 09/10/2011   H/O gastric bypass; 05/14/11([facility]) 09/10/2011   Essential hypertension 10/28/2008   DEPRESSION/ANXIETY 02/19/2007   CARPAL TUNNEL SYNDROME, RIGHT 02/19/2007   ALLERGIC RHINITIS 02/19/2007   GERD 02/19/2007   IRRITABLE BOWEL SYNDROME 02/19/2007   HERPES GENITALIS 12/30/2006   Diabetes ([place]) 12/30/2006   OSTEOPENIA 12/30/2006   ANOMALY, CONGENITAL, NERVOUS SYSTEM NEC 12/30/2006    Sukhdeep Wieting W. 10/14/2019, 9:19 AM Frazier Butt., PT  [facility] [address] [place], [place], [address] Phone: [phone]   Fax:  [phone]  Name: ANAIZA BEHRENS MRN: [id number] Date of Birth: 1970-11-18

## 2019-10-16 ENCOUNTER — Other Ambulatory Visit: Payer: Self-pay

## 2019-10-16 ENCOUNTER — Ambulatory Visit: Payer: Medicare Other | Admitting: Physical Therapy

## 2019-10-16 DIAGNOSIS — M6281 Muscle weakness (generalized): Secondary | ICD-10-CM

## 2019-10-16 DIAGNOSIS — R2689 Other abnormalities of gait and mobility: Secondary | ICD-10-CM

## 2019-10-16 DIAGNOSIS — R2681 Unsteadiness on feet: Secondary | ICD-10-CM

## 2019-10-16 NOTE — Therapy (Addendum)
[facility] [address] [place], [place], [address] Phone: [phone]   Fax:  [phone]  Physical Therapy Treatment  Patient Details  Name: Ann Lopez MRN: [id number] Date of Birth: [birth year] Referring Provider (PT): Dr. Leeroy Cha   Encounter Date: 10/16/2019   PT End of Session - 10/16/19 1225    Visit Number 11    Number of Visits 26    Date for PT Re-Evaluation 12/23/19    Authorization Type UHC Medicare/Medicaid    Authorization Time Period 09/24/2019-12/23/2019    Progress Note Due on Visit 20    PT Start Time 0847    PT Stop Time 0930    PT Time Calculation (min) 43 min    Activity Tolerance Patient tolerated treatment well    Behavior During Therapy [facility] for tasks assessed/performed           Past Medical History:  Diagnosis Date  . Allergy   . Anxiety   . Asthma   . Back pain   . Breast cancer ([place])   . Cancer ([place])   . Depression   . Diabetes mellitus   . Headache   . Hyperlipemia   . Osteopenia   . Osteoporosis    osteopenia  . Stroke [facility])     Past Surgical History:  Procedure Laterality Date  . ABDOMINAL HYSTERECTOMY     B oophorectomy for BRCA1 gene  . BREAST SURGERY    . CESAREAN SECTION    . INCISION AND DRAINAGE ABSCESS Left 11/14/2012   Procedure: INCISION AND DRAINAGE ABSCESS LEFT GROIN;  Surgeon: Jamesetta So, MD;  Location: [facility];  Service: General;  Laterality: Left;  . IR ANGIO VERTEBRAL SEL SUBCLAVIAN INNOMINATE UNI R MOD SED  03/02/2019  . IR CT HEAD LTD  03/02/2019  . IR INTRAVSC STENT CERV CAROTID W/O EMB-PROT MOD SED INC ANGIO  03/02/2019  . IR PERCUTANEOUS ART THROMBECTOMY/INFUSION INTRACRANIAL INC DIAG ANGIO  03/02/2019  . LYMPHADENECTOMY    . MASTECTOMY Bilateral   . RADIOLOGY WITH ANESTHESIA N/A 03/02/2019   Procedure: IR WITH ANESTHESIA;  Surgeon: Luanne Bras, MD;  Location: [place];  Service: Radiology;  Laterality: N/A;    There were no  vitals filed for this visit.   Subjective Assessment - 10/16/19 0849    Subjective Went back to [facility] yesterday (for the last clinical trial) and scores went higher.    Patient Stated Goals Pt wants all the movement back in L side.    Currently in Pain? No/denies                             [facility] Adult PT Treatment/Exercise - 10/16/19 0001      Ambulation/Gait   Ambulation/Gait Yes    Ambulation/Gait Assistance 5: Supervision    Ambulation/Gait Assistance Details Utilized foot-up brace on LLE, with improvement noted in timing and coordianation of LLE, decreased lateral lean onto L side during gait.    Ambulation Distance (Feet) 230 Feet   x 2 reps; 50 ft x 2 with foot-up brace, 115 ft no brace   Assistive device None    Gait Pattern Step-through pattern;Decreased arm swing - left;Decreased stance time - left;Lateral trunk lean to left   Improved upright trunk noted with gait   Ambulation Surface Level;Indoor    Gait Comments Trialed foot-up brace based on pt's reports of feeling like L foot is going to roll (and PT  noting slowed swing through with increased inversion/slight plantarflexion moment).  With use of foot-up brace, pt is able to perform swing through with more neutral positioning and more neutral dorsiflexion at heelstrike.  Pt immediately notes imporvement in stability of L foot with addition of foot-up brace.  Discussed and provided information on how pt can obtain.      Self-Care   Self-Care Other Self-Care Comments    Other Self-Care Comments  Discussed pt's tone in regards to LUE and discussed recommendation of follow up with rehab physician (Dr. Milagros Evener) to assess LUE/LLE tone for better tone management-to avoid synergy patterns and pain in future.      Neuro Re-ed    Neuro Re-ed Details  Standing beside counter, with mirror for visual feedback on posture:  LLE as stance with RLE propped on 4" aerobic step-head turns x 5, head nods x 5 with light to no UE  support.  Additional rep of LLE as stance with RLE propped at 4" step, terminal knee extension LLE x 10 reps (cues to avoid L knee recurvatum).  Occasional tactile cues at L trunk and R shoulder for neutral trunk positioning.        Knee/Hip Exercises: Standing   Forward Step Up Right;Left;1 set;10 reps;Hand Hold: 1;Step Height: 4"   step up/up, down/down with mirror for visual cues   Forward Step Up Limitations Wearing foot-up brace               Balance Exercises - 10/16/19 0001      Balance Exercises: Standing   Other Standing Exercises Standing on [employer]:  forward step taps, alternating x 10 reps with intermittent UE support, then back step taps x 10 reps; lateral step taps x 10 reps iwth intermittent UE support, occasional cues for trunk positioning, to prevent R shoulder hike and trunk rotation forward on RLE.             PT Education - 10/16/19 1224    Education Details Benefits of foot-up brace and how to obtain for home use    Person(s) Educated Patient    Methods Explanation;Demonstration;Handout    Comprehension Verbalized understanding;Returned demonstration;Verbal cues required            PT Short Term Goals - 09/24/19 1448      PT SHORT TERM GOAL #1   Title Pt will be independent with HEP for improved balance, strength, gait.  TARGET 5 weeks for all LTGs: 10/23/2019    Time 5    Period Weeks    Status New      PT SHORT TERM GOAL #2   Title Pt will improve 5x sit<>stand score to less than or equal to 12 seconds for improved functional strength.    Time 5    Period Weeks    Status New      PT SHORT TERM GOAL #3   Title Pt will improve FGA score to at least 20/30 for decreased fall risk.    Time 5    Period Weeks    Status New      PT SHORT TERM GOAL #4   Title Pt will verbalize understanding of fall prevention in home environment.    Time 5    Period Weeks    Status New             PT Long Term Goals - 09/24/19 1451      PT LONG TERM GOAL #1    Title Pt will be independent with  progression of HEP for strength, balance, and gait.  TARGET 11/20/2019    Time 9    Period Weeks    Status New      PT LONG TERM GOAL #2   Title Pt will improve 5x sit<>stand score to less than or equal to 10 seconds for improved functional strength.    Time 9    Period Weeks    Status New      PT LONG TERM GOAL #3   Title Pt will improve Functional Gait Assessment score to at least 22/30 for decreased fall risk.    Time 9    Period Weeks    Status New      PT LONG TERM GOAL #4   Title Pt will improve gait velocity to at least 3 ft/sec for improved gait efficiency for community ambulation.    Time 9    Period Weeks    Status New      PT LONG TERM GOAL #5   Title Pt will ambulate at least 1000 ft, outdoor and indoor surfaces, appropriate device as needed, modified independently, for improved community gait.    Time 9    Period Weeks    Status New      Additional Long Term Goals   Additional Long Term Goals Yes      PT LONG TERM GOAL #6   Title FOTO score to improve by at least 10 % for improved overall reported functional mobility.    Time 9    Period Weeks    Status New                 Plan - 10/16/19 1225    Clinical Impression Statement Trial of foot-up brace on LLE today to try to assist with timing and coordination for swing through and heelstrike.  Pt notes immediately and PT notes with gait that pt has improved heelstrike, decreased inversion/plantarflexion moment with swing phase, improved stability in initial stance, which leads to improved stability on LLE and decreased lateral trunk lean.  Pt is pleased with gait using foot-up; she also improved standing positioning with SLS activities.  She will continue to benefit from skilled PT to address balance, strength and gait towards goals.    Personal Factors and Comorbidities Comorbidity 3+    Comorbidities PMH:  diabetes, hyperlipidemia, smoking, breast cancer, R carotid  stenosis requiring emergent thrombectomy, anxiety depression    Examination-Activity Limitations Locomotion Level;Transfers;Reach Overhead;Stand;Stairs;Lift;Dressing    Examination-Participation Restrictions Community Activity;Other   caring for grandchild   Stability/Clinical Decision Making Evolving/Moderate complexity    Rehab Potential Good    PT Frequency Other (comment)   1x/wk for 1 week, then 3x/wk for 8 weeks   PT Duration Other (comment)   9 weeks   PT Treatment/Interventions ADLs/Self Care Home Management;Electrical Stimulation;DME Instruction;Gait training;Stair training;Functional mobility training;Therapeutic activities;Therapeutic exercise;Balance training;Neuromuscular re-education;Manual techniques;Orthotic Fit/Training;Patient/family education;Passive range of motion;Aquatic Therapy    PT Next Visit Plan Use of foot-up in clinic until she orders one; gait training and SLS LLE weightbearing/trunk control activities    Consulted and Agree with Plan of Care Patient           Patient will benefit from skilled therapeutic intervention in order to improve the following deficits and impairments:  Abnormal gait, Decreased coordination, Difficulty walking, Impaired tone, Impaired UE functional use, Decreased activity tolerance, Decreased balance, Decreased mobility, Decreased strength, Postural dysfunction  Visit Diagnosis: Other abnormalities of gait and mobility  Unsteadiness on feet  Muscle weakness (  generalized)     Problem List Patient Active Problem List   Diagnosis Date Noted  . Vascular headache   . Other chronic pain   . Uncontrolled type 2 diabetes mellitus with hyperglycemia ([place])   . Labile blood glucose   . Panic disorder with agoraphobia and moderate panic attacks   . Major depressive disorder, recurrent episode, moderate ([place])   . MCI (mild cognitive impairment) with memory loss   . Acute ischemic right middle cerebral artery (MCA) stroke ([place]) 03/09/2019    . Left hemiparesis ([place])   . Bradycardia   . Tachypnea   . SIRS (systemic inflammatory response syndrome) ([facility])   . Hypokalemia   . Diabetes mellitus type 2 in obese ([place])   . Tobacco abuse   . History of breast cancer   . Cognitive deficits   . Dysphagia, post-stroke   . Stroke (cerebrum) ([place]) 03/02/2019  . Middle cerebral artery embolism, right 03/02/2019  . Chest pain 12/19/2015  . Numbness 12/19/2015  . Femoral hernia 03/24/2014  . Dyslipidemia 09/10/2011  . BRCA1 positive 09/10/2011  . Breast cancer ([place]) 09/10/2011  . H/O gastric bypass; 05/14/11([facility]) 09/10/2011  . Essential hypertension 10/28/2008  . DEPRESSION/ANXIETY 02/19/2007  . CARPAL TUNNEL SYNDROME, RIGHT 02/19/2007  . ALLERGIC RHINITIS 02/19/2007  . GERD 02/19/2007  . IRRITABLE BOWEL SYNDROME 02/19/2007  . HERPES GENITALIS 12/30/2006  . Diabetes ([place]) 12/30/2006  . OSTEOPENIA 12/30/2006  . ANOMALY, CONGENITAL, NERVOUS SYSTEM NEC 12/30/2006    Nivan Melendrez W. 10/16/2019, 3:51 PM Frazier Butt., PT  [facility] [address] [place], [place], [address] Phone: [phone]   Fax:  [phone]  Name: Ann Lopez MRN: [id number] Date of Birth: 12-25-1970

## 2019-10-19 ENCOUNTER — Ambulatory Visit: Payer: Medicare Other | Admitting: Physical Therapy

## 2019-10-19 ENCOUNTER — Other Ambulatory Visit: Payer: Self-pay

## 2019-10-19 DIAGNOSIS — R293 Abnormal posture: Secondary | ICD-10-CM

## 2019-10-19 DIAGNOSIS — R2689 Other abnormalities of gait and mobility: Secondary | ICD-10-CM

## 2019-10-19 DIAGNOSIS — R2681 Unsteadiness on feet: Secondary | ICD-10-CM

## 2019-10-19 DIAGNOSIS — M6281 Muscle weakness (generalized): Secondary | ICD-10-CM

## 2019-10-20 ENCOUNTER — Encounter: Payer: Self-pay | Admitting: Physical Therapy

## 2019-10-20 NOTE — Therapy (Signed)
[facility] [address] [place], [place], [address] Phone: [phone]   Fax:  [phone]  Physical Therapy Treatment  Patient Details  Name: Ann Lopez MRN: [id number] Date of Birth: Apr 17, 1970 Referring Provider (PT): Dr. Leeroy Cha   Encounter Date: 10/19/2019   PT End of Session - 10/20/19 0824    Visit Number 12    Number of Visits 26    Date for PT Re-Evaluation 12/23/19    Authorization Type UHC Medicare/Medicaid    Authorization Time Period 09/24/2019-12/23/2019    Progress Note Due on Visit 20    PT Start Time 1415    PT Stop Time 1500    PT Time Calculation (min) 45 min    Equipment Utilized During Treatment --   pool noodle, ankle buoyancy cuffs   Activity Tolerance Patient tolerated treatment well    Behavior During Therapy [facility] for tasks assessed/performed           Past Medical History:  Diagnosis Date  . Allergy   . Anxiety   . Asthma   . Back pain   . Breast cancer ([place])   . Cancer ([place])   . Depression   . Diabetes mellitus   . Headache   . Hyperlipemia   . Osteopenia   . Osteoporosis    osteopenia  . Stroke [facility])     Past Surgical History:  Procedure Laterality Date  . ABDOMINAL HYSTERECTOMY     B oophorectomy for BRCA1 gene  . BREAST SURGERY    . CESAREAN SECTION    . INCISION AND DRAINAGE ABSCESS Left 11/14/2012   Procedure: INCISION AND DRAINAGE ABSCESS LEFT GROIN;  Surgeon: Jamesetta So, MD;  Location: [facility];  Service: General;  Laterality: Left;  . IR ANGIO VERTEBRAL SEL SUBCLAVIAN INNOMINATE UNI R MOD SED  03/02/2019  . IR CT HEAD LTD  03/02/2019  . IR INTRAVSC STENT CERV CAROTID W/O EMB-PROT MOD SED INC ANGIO  03/02/2019  . IR PERCUTANEOUS ART THROMBECTOMY/INFUSION INTRACRANIAL INC DIAG ANGIO  03/02/2019  . LYMPHADENECTOMY    . MASTECTOMY Bilateral   . RADIOLOGY WITH ANESTHESIA N/A 03/02/2019   Procedure: IR WITH ANESTHESIA;  Surgeon: Luanne Bras, MD;   Location: [facility];  Service: Radiology;  Laterality: N/A;    There were no vitals filed for this visit.   Subjective Assessment - 10/20/19 0821    Subjective States she was diagnosed with frozen shoulder on the L and is having injection tomorrow im MD office.  Had increased pain in L shoulder/arm yesterday and today.    Patient Stated Goals Pt wants all the movement back in L side.    Currently in Pain? Yes    Pain Score 4    increases with AROM into flexion or abduction   Pain Location Shoulder    Pain Orientation Left    Pain Descriptors / Indicators Sharp    Pain Type Chronic pain    Pain Onset More than a month ago    Pain Frequency Other (Comment)   movement   Aggravating Factors  movement               Aquatic therapy at [facility] - pool temp 87.4 degrees  Patient seen for aquatic therapy today. Treatment took place in water 4 feet deep depending upon activity. Pt entered and exited the pool via steps with use of right hand rail withSBA.  Pt performed hamstring stretch (runner's stretch) for each leg - 1  rep each - 30 sec hold  Seated on pool bench to perform sit<>stand x 10 reps with 1 UE support x 2 sets.  In seated position attempted to performed Ai Chi postures of enclsoing but pt with increased c/o pain in L shoulder so deferred.  Pt performed bil shoulder protraction/retraction x 10 reps x 2 sets.  Holding pool noodle for gentle AAROM for same x 10 reps x 2 sets.  Bil shoulder circles both directions x 10 reps with arms held at approx. 40 degress flexion.  Pt gait trained 64[place]ross pool2reps, backwards amb 220m 2, sideways amb. 2576m2  Reps  Squats x 10 reps with 1 UE support on pool wall: Unilateral squats on each leg - 10 reps RLE, 10 reps LLE with 1 UE support for closed chain strengthening   Standing with 1 UE support of pool edge with bil ankle buoyancy cuffs for bil hip flexion, extension, hip flexion moving into extension, hip abd, hip add with pool  wall as target, SLR all x 10 reps.  Some performed with alternating LE's to increase balance challenge.  Pt requires the buoyancy of water for active assisted exercises with buoyancy supported for strengthening & ROM exercises: pt requires the viscosity of the water for resistance with strengthening exercises Gait training can be performed in water with minimal fall risk in the water  Water current provides perturbations which challenge balance with activities.     PT Short Term Goals - 09/24/19 1448      PT SHORT TERM GOAL #1   Title Pt will be independent with HEP for improved balance, strength, gait.  TARGET 5 weeks for all LTGs: 10/23/2019    Time 5    Period Weeks    Status New      PT SHORT TERM GOAL #2   Title Pt will improve 5x sit<>stand score to less than or equal to 12 seconds for improved functional strength.    Time 5    Period Weeks    Status New      PT SHORT TERM GOAL #3   Title Pt will improve FGA score to at least 20/30 for decreased fall risk.    Time 5    Period Weeks    Status New      PT SHORT TERM GOAL #4   Title Pt will verbalize understanding of fall prevention in home environment.    Time 5    Period Weeks    Status New             PT Long Term Goals - 09/24/19 1451      PT LONG TERM GOAL #1   Title Pt will be independent with progression of HEP for strength, balance, and gait.  TARGET 11/20/2019    Time 9    Period Weeks    Status New      PT LONG TERM GOAL #2   Title Pt will improve 5x sit<>stand score to less than or equal to 10 seconds for improved functional strength.    Time 9    Period Weeks    Status New      PT LONG TERM GOAL #3   Title Pt will improve Functional Gait Assessment score to at least 22/30 for decreased fall risk.    Time 9    Period Weeks    Status New      PT LONG TERM GOAL #4   Title Pt will improve gait velocity  to at least 3 ft/sec for improved gait efficiency for community ambulation.    Time 9     Period Weeks    Status New      PT LONG TERM GOAL #5   Title Pt will ambulate at least 1000 ft, outdoor and indoor surfaces, appropriate device as needed, modified independently, for improved community gait.    Time 9    Period Weeks    Status New      Additional Long Term Goals   Additional Long Term Goals Yes      PT LONG TERM GOAL #6   Title FOTO score to improve by at least 10 % for improved overall reported functional mobility.    Time 9    Period Weeks    Status New                 Plan - 10/20/19 2585    Clinical Impression Statement Pt reports decreased L shoulder pain after last session but returned several days later.  Pt with improved balance reactions in pool and improved gait.  cont per poc.    Personal Factors and Comorbidities Comorbidity 3+    Comorbidities PMH:  diabetes, hyperlipidemia, smoking, breast cancer, R carotid stenosis requiring emergent thrombectomy, anxiety depression    Examination-Activity Limitations Locomotion Level;Transfers;Reach Overhead;Stand;Stairs;Lift;Dressing    Examination-Participation Restrictions Community Activity;Other   caring for grandchild   Stability/Clinical Decision Making Evolving/Moderate complexity    Rehab Potential Good    PT Frequency Other (comment)   1x/wk for 1 week, then 3x/wk for 8 weeks   PT Duration Other (comment)   9 weeks   PT Treatment/Interventions ADLs/Self Care Home Management;Electrical Stimulation;DME Instruction;Gait training;Stair training;Functional mobility training;Therapeutic activities;Therapeutic exercise;Balance training;Neuromuscular re-education;Manual techniques;Orthotic Fit/Training;Patient/family education;Passive range of motion;Aquatic Therapy    PT Next Visit Plan How did shoulder injection go?  Any restrictions?  Use of foot-up in clinic until she orders one; gait training and SLS LLE weightbearing/trunk control activities    Consulted and Agree with Plan of Care Patient            Patient will benefit from skilled therapeutic intervention in order to improve the following deficits and impairments:  Abnormal gait, Decreased coordination, Difficulty walking, Impaired tone, Impaired UE functional use, Decreased activity tolerance, Decreased balance, Decreased mobility, Decreased strength, Postural dysfunction  Visit Diagnosis: Other abnormalities of gait and mobility  Unsteadiness on feet  Muscle weakness (generalized)  Abnormal posture     Problem List Patient Active Problem List   Diagnosis Date Noted  . Vascular headache   . Other chronic pain   . Uncontrolled type 2 diabetes mellitus with hyperglycemia ([place])   . Labile blood glucose   . Panic disorder with agoraphobia and moderate panic attacks   . Major depressive disorder, recurrent episode, moderate ([place])   . MCI (mild cognitive impairment) with memory loss   . Acute ischemic right middle cerebral artery (MCA) stroke ([place]) 03/09/2019  . Left hemiparesis ([place])   . Bradycardia   . Tachypnea   . SIRS (systemic inflammatory response syndrome) ([facility])   . Hypokalemia   . Diabetes mellitus type 2 in obese ([place])   . Tobacco abuse   . History of breast cancer   . Cognitive deficits   . Dysphagia, post-stroke   . Stroke (cerebrum) ([place]) 03/02/2019  . Middle cerebral artery embolism, right 03/02/2019  . Chest pain 12/19/2015  . Numbness 12/19/2015  . Femoral hernia 03/24/2014  . Dyslipidemia 09/10/2011  . BRCA1  positive 09/10/2011  . Breast cancer ([place]) 09/10/2011  . H/O gastric bypass; 05/14/11([facility]) 09/10/2011  . Essential hypertension 10/28/2008  . DEPRESSION/ANXIETY 02/19/2007  . CARPAL TUNNEL SYNDROME, RIGHT 02/19/2007  . ALLERGIC RHINITIS 02/19/2007  . GERD 02/19/2007  . IRRITABLE BOWEL SYNDROME 02/19/2007  . HERPES GENITALIS 12/30/2006  . Diabetes ([place]) 12/30/2006  . OSTEOPENIA 12/30/2006  . ANOMALY, CONGENITAL, NERVOUS SYSTEM NEC 12/30/2006    Narda Bonds, [place] 10/20/19 8:32 AM Phone: [phone] Fax: [facility] [address] [place], [place], [address] Phone: [phone]   Fax:  [phone]  Name: Ann Lopez MRN: [id number] Date of Birth: Apr 29, 1970

## 2019-10-21 ENCOUNTER — Ambulatory Visit: Payer: Medicare Other | Admitting: Occupational Therapy

## 2019-10-21 ENCOUNTER — Ambulatory Visit: Payer: Medicare Other | Admitting: Physical Therapy

## 2019-10-21 ENCOUNTER — Encounter: Payer: Self-pay | Admitting: Physical Therapy

## 2019-10-21 ENCOUNTER — Other Ambulatory Visit: Payer: Self-pay

## 2019-10-21 DIAGNOSIS — R29898 Other symptoms and signs involving the musculoskeletal system: Secondary | ICD-10-CM

## 2019-10-21 DIAGNOSIS — R29818 Other symptoms and signs involving the nervous system: Secondary | ICD-10-CM

## 2019-10-21 DIAGNOSIS — R293 Abnormal posture: Secondary | ICD-10-CM

## 2019-10-21 DIAGNOSIS — M25512 Pain in left shoulder: Secondary | ICD-10-CM

## 2019-10-21 DIAGNOSIS — M6281 Muscle weakness (generalized): Secondary | ICD-10-CM

## 2019-10-21 DIAGNOSIS — R2681 Unsteadiness on feet: Secondary | ICD-10-CM

## 2019-10-21 DIAGNOSIS — R2689 Other abnormalities of gait and mobility: Secondary | ICD-10-CM | POA: Diagnosis not present

## 2019-10-21 DIAGNOSIS — R278 Other lack of coordination: Secondary | ICD-10-CM

## 2019-10-21 DIAGNOSIS — I69354 Hemiplegia and hemiparesis following cerebral infarction affecting left non-dominant side: Secondary | ICD-10-CM

## 2019-10-21 NOTE — Therapy (Signed)
[place] [address] [place], [place], [address] Phone: [phone]   Fax:  [phone]  Physical Therapy Treatment  Patient Details  Name: Ann Lopez MRN: [id number] Date of Birth: 06-Mar-1971 Referring Provider (PT): Dr. Leeroy Cha   Encounter Date: 10/21/2019   PT End of Session - 10/21/19 1022    Visit Number 13    Number of Visits 26    Date for PT Re-Evaluation 12/23/19    Authorization Type UHC Medicare/Medicaid    Authorization Time Period 09/24/2019-12/23/2019    Progress Note Due on Visit 20    PT Start Time 0846    PT Stop Time 0930    PT Time Calculation (min) 44 min    Equipment Utilized During Treatment Gait belt   Activity Tolerance Patient tolerated treatment well    Behavior During Therapy [facility] for tasks assessed/performed           Past Medical History:  Diagnosis Date  . Allergy   . Anxiety   . Asthma   . Back pain   . Breast cancer ([place])   . Cancer ([place])   . Depression   . Diabetes mellitus   . Headache   . Hyperlipemia   . Osteopenia   . Osteoporosis    osteopenia  . Stroke [facility]     Past Surgical History:  Procedure Laterality Date  . ABDOMINAL HYSTERECTOMY     B oophorectomy for BRCA1 gene  . BREAST SURGERY    . CESAREAN SECTION    . INCISION AND DRAINAGE ABSCESS Left 11/14/2012   Procedure: INCISION AND DRAINAGE ABSCESS LEFT GROIN;  Surgeon: Jamesetta So, MD;  Location: [facility];  Service: General;  Laterality: Left;  . IR ANGIO VERTEBRAL SEL SUBCLAVIAN INNOMINATE UNI R MOD SED  03/02/2019  . IR CT HEAD LTD  03/02/2019  . IR INTRAVSC STENT CERV CAROTID W/O EMB-PROT MOD SED INC ANGIO  03/02/2019  . IR PERCUTANEOUS ART THROMBECTOMY/INFUSION INTRACRANIAL INC DIAG ANGIO  03/02/2019  . LYMPHADENECTOMY    . MASTECTOMY Bilateral   . RADIOLOGY WITH ANESTHESIA N/A 03/02/2019   Procedure: IR WITH ANESTHESIA;  Surgeon: Luanne Bras, MD;  Location: [place];  Service:  Radiology;  Laterality: N/A;    There were no vitals filed for this visit.   Subjective Assessment - 10/21/19 0851    Subjective Pt. states that she got a steroid shot in her shoulder yesterday and it made her sugar go sky high, so she had to take extra insulin this morning. It was at 400 this morning. No falls and no complaints of pain today.    Patient Stated Goals Pt wants all the movement back in L side.    Currently in Pain? No/denies    Pain Score 0-No pain    Pain Onset More than a month ago    Multiple Pain Sites No              [facility] PT Assessment - 10/21/19 0900      Transfers   Five time sit to stand comments  10/21/19: 12.31. Without UE use using standard height chair     Functional Gait  Assessment   Gait assessed  Yes    Gait Level Surface Walks 20 ft in less than 7 sec but greater than 5.5 sec, uses assistive device, slower speed, mild gait deviations, or deviates 6-10 in outside of the 12 in walkway width.   6.72   Change in Gait  Speed Able to smoothly change walking speed without loss of balance or gait deviation. Deviate no more than 6 in outside of the 12 in walkway width.    Gait with Horizontal Head Turns Performs head turns smoothly with no change in gait. Deviates no more than 6 in outside 12 in walkway width    Gait with Vertical Head Turns Performs head turns with no change in gait. Deviates no more than 6 in outside 12 in walkway width.    Gait and Pivot Turn Pivot turns safely within 3 sec and stops quickly with no loss of balance.    Step Over Obstacle Is able to step over one shoe box (4.5 in total height) without changing gait speed. No evidence of imbalance.    Gait with Narrow Base of Support Ambulates 7-9 steps.    Gait with Eyes Closed Walks 20 ft, uses assistive device, slower speed, mild gait deviations, deviates 6-10 in outside 12 in walkway width. Ambulates 20 ft in less than 9 sec but greater than 7 sec.   8.07   Ambulating Backwards Walks 20 ft,  uses assistive device, slower speed, mild gait deviations, deviates 6-10 in outside 12 in walkway width.    Steps Alternating feet, must use rail.    Total Score 24               [facility] Adult PT Treatment/Exercise - 10/21/19 0900      Ambulation/Gait   Ambulation/Gait Yes    Ambulation/Gait Assistance 5: Supervision    Ambulation/Gait Assistance Details Foot-up brace was used during todays session to improve poor foot clearance on the L    Ambulation Distance (Feet) 280 Feet    Assistive device None    Gait Pattern Step-through pattern;Decreased stance time - left;Decreased arm swing - left    Ambulation Surface Level;Indoor    Gait Comments Pt. continues to have lateral trunk lean and guarding of the L UE; VC's were given for pt. to relax L UE to allow for arm swing      Knee/Hip Exercises: Standing   Lateral Step Up Both;1 set;15 reps;Hand Hold: 1;Step Height: 4";Limitations    Lateral Step Up Limitations pt. performed step ups in front of the mirror with focus on TKE control on the LLE               Balance Exercises - 10/21/19 0001      Balance Exercises: Standing   Stepping Strategy 10 reps;Foam/compliant surface;Posterior;Anterior    Stepping Strategy Limitations pt. performed fwd and bwd stepping on & off of blue foam beam with no UE support; 2 sets x 10 reps bil LE's alternating.            PT Education - 10/21/19 1131    Education Details Educated pt. on how to apply foot-up brace for home use.    Person(s) Educated Patient    Methods Explanation;Demonstration    Comprehension Verbalized understanding            PT Short Term Goals - 10/21/19 0853      PT SHORT TERM GOAL #1   Title Pt will be independent with HEP for improved balance, strength, gait.  TARGET 5 weeks for all LTGs: 10/23/2019    Baseline 10/21/19: pt. verbalizes independence with HEP.    Time 5    Period Weeks    Status Achieved      PT SHORT TERM GOAL #2   Title Pt will improve 5x  sit<>stand  score to less than or equal to 12 seconds for improved functional strength.    Baseline 10/21/19: 12.31 seconds to complete 5x sit<>stand.    Time 5    Period Weeks    Status Partially Met      PT SHORT TERM GOAL #3   Title Pt will improve FGA score to at least 20/30 for decreased fall risk.    Baseline 10/21/19: pt. scored a 24/30 on the FGA today showing a decrease in fall risk.    Time 5    Period Weeks    Status Achieved      PT SHORT TERM GOAL #4   Title Pt will verbalize understanding of fall prevention in home environment.    Baseline 10/21/19: pt. verbalizes fall prevention techniques like using a rubber mat in the shower, and handrails when available.    Time 5    Period Weeks    Status Achieved             PT Long Term Goals - 09/24/19 1451      PT LONG TERM GOAL #1   Title Pt will be independent with progression of HEP for strength, balance, and gait.  TARGET 11/20/2019    Time 9    Period Weeks    Status New      PT LONG TERM GOAL #2   Title Pt will improve 5x sit<>stand score to less than or equal to 10 seconds for improved functional strength.    Time 9    Period Weeks    Status New      PT LONG TERM GOAL #3   Title Pt will improve Functional Gait Assessment score to at least 22/30 for decreased fall risk.    Time 9    Period Weeks    Status New      PT LONG TERM GOAL #4   Title Pt will improve gait velocity to at least 3 ft/sec for improved gait efficiency for community ambulation.    Time 9    Period Weeks    Status New      PT LONG TERM GOAL #5   Title Pt will ambulate at least 1000 ft, outdoor and indoor surfaces, appropriate device as needed, modified independently, for improved community gait.    Time 9    Period Weeks    Status New      Additional Long Term Goals   Additional Long Term Goals Yes      PT LONG TERM GOAL #6   Title FOTO score to improve by at least 10 % for improved overall reported functional mobility.    Time 9     Period Weeks    Status New                 Plan - 10/21/19 1126    Clinical Impression Statement Todays therapy session placed emphasis on checking all STG's; pt. has achieved goals #1, 3 and 4 and has made progress toward goal #2. This displays an improvement in LE strength and functional gait. The foot-up brace was used this session for gait training as well as to educate the pt. on applicaiton of brace so that she can properly use the one she ordered when it arrives. Pt. will benefit form further PT in order to progress towards her LTG's.    Personal Factors and Comorbidities Comorbidity 3+    Comorbidities PMH:  diabetes, hyperlipidemia, smoking, breast cancer, R carotid stenosis requiring emergent thrombectomy, anxiety  depression    Examination-Activity Limitations Locomotion Level;Transfers;Reach Overhead;Stand;Stairs;Lift;Dressing    Examination-Participation Restrictions Community Activity;Other   caring for grandchild   Stability/Clinical Decision Making Evolving/Moderate complexity    Rehab Potential Good    PT Frequency Other (comment)   1x/wk for 1 week, then 3x/wk for 8 weeks   PT Duration Other (comment)   9 weeks   PT Treatment/Interventions ADLs/Self Care Home Management;Electrical Stimulation;DME Instruction;Gait training;Stair training;Functional mobility training;Therapeutic activities;Therapeutic exercise;Balance training;Neuromuscular re-education;Manual techniques;Orthotic Fit/Training;Patient/family education;Passive range of motion;Aquatic Therapy    PT Next Visit Plan Use of foot-up in clinic until she orders one; gait training and SLS LLE weightbearing/trunk control activities    Consulted and Agree with Plan of Care Patient           Patient will benefit from skilled therapeutic intervention in order to improve the following deficits and impairments:  Abnormal gait, Decreased coordination, Difficulty walking, Impaired tone, Impaired UE functional use,  Decreased activity tolerance, Decreased balance, Decreased mobility, Decreased strength, Postural dysfunction  Visit Diagnosis: Other abnormalities of gait and mobility  Unsteadiness on feet  Muscle weakness (generalized)     Problem List Patient Active Problem List   Diagnosis Date Noted  . Vascular headache   . Other chronic pain   . Uncontrolled type 2 diabetes mellitus with hyperglycemia ([place])   . Labile blood glucose   . Panic disorder with agoraphobia and moderate panic attacks   . Major depressive disorder, recurrent episode, moderate ([place])   . MCI (mild cognitive impairment) with memory loss   . Acute ischemic right middle cerebral artery (MCA) stroke ([place]) 03/09/2019  . Left hemiparesis ([place])   . Bradycardia   . Tachypnea   . SIRS (systemic inflammatory response syndrome) ([facility])   . Hypokalemia   . Diabetes mellitus type 2 in obese ([place])   . Tobacco abuse   . History of breast cancer   . Cognitive deficits   . Dysphagia, post-stroke   . Stroke (cerebrum) ([place]) 03/02/2019  . Middle cerebral artery embolism, right 03/02/2019  . Chest pain 12/19/2015  . Numbness 12/19/2015  . Femoral hernia 03/24/2014  . Dyslipidemia 09/10/2011  . BRCA1 positive 09/10/2011  . Breast cancer ([place]) 09/10/2011  . H/O gastric bypass; 05/14/11([facility]) 09/10/2011  . Essential hypertension 10/28/2008  . DEPRESSION/ANXIETY 02/19/2007  . CARPAL TUNNEL SYNDROME, RIGHT 02/19/2007  . ALLERGIC RHINITIS 02/19/2007  . GERD 02/19/2007  . IRRITABLE BOWEL SYNDROME 02/19/2007  . HERPES GENITALIS 12/30/2006  . Diabetes ([place]) 12/30/2006  . OSTEOPENIA 12/30/2006  . ANOMALY, CONGENITAL, NERVOUS SYSTEM NEC 12/30/2006    Royann Shivers, SPTA 10/21/2019, 11:34 AM  [place] [address] [place], [place], [address] Phone: [phone]   Fax:  [phone]  Name: Ann Lopez MRN: [id number] Date of Birth: 1970/04/23   This note has been  reviewed and edited by supervising CI.  Willow Ora, PTA, [place] [address], [place], [place] [address] [phone] 10/22/19, 4:25 PM

## 2019-10-21 NOTE — Patient Instructions (Signed)
   Lie on back holding pool noodle with palms facing each other. Raise arms over head with in pain free range, keep shoulder blade down. Hold 5sec. Repeat 10 times per set.  Do 2-3 sessions per day.   ROM: Abduction - Wand   Sit Holding wand with left hand palm up, push wand directly out to side, leading with other hand palm down, until stretch is felt. Hold 5 seconds. Repeat 10 times per set. Do 2-3 sessions per day.   ROM: Extension - Wand (Standing)   Stand holding wand behind back. Raise arms as far as possible. Repeat 10 times per set.  Do 2-3 sessions per day.        Active Assistive Shoulder External Rotation    SIT with stick at waist level, LEFT palm up, other palm down. Step to side, push forearm out from body with hand palm down, and keep elbows bent. Hold. Side step and return to start position. Perform 10 reps. 2X DAY

## 2019-10-21 NOTE — Therapy (Signed)
[place] [address] [place], [place], [address] Phone: [phone]   Fax:  [phone]  Occupational Therapy Treatment  Patient Details  Name: Ann Lopez MRN: [id number] Date of Birth: Nov 17, 1970 Referring Provider (OT): Dr. Leeroy Cha   Encounter Date: 10/21/2019   OT End of Session - 10/21/19 0815    Visit Number 2    Number of Visits 21    Authorization Type UHC Medicare/ Medicaid    OT Start Time [phone]    OT Stop Time 0845    OT Time Calculation (min) 39 min    Activity Tolerance Patient limited by pain    Behavior During Therapy [facility] for tasks assessed/performed           Past Medical History:  Diagnosis Date  . Allergy   . Anxiety   . Asthma   . Back pain   . Breast cancer ([place])   . Cancer ([place])   . Depression   . Diabetes mellitus   . Headache   . Hyperlipemia   . Osteopenia   . Osteoporosis    osteopenia  . Stroke [facility])     Past Surgical History:  Procedure Laterality Date  . ABDOMINAL HYSTERECTOMY     B oophorectomy for BRCA1 gene  . BREAST SURGERY    . CESAREAN SECTION    . INCISION AND DRAINAGE ABSCESS Left 11/14/2012   Procedure: INCISION AND DRAINAGE ABSCESS LEFT GROIN;  Surgeon: Jamesetta So, MD;  Location: [facility];  Service: General;  Laterality: Left;  . IR ANGIO VERTEBRAL SEL SUBCLAVIAN INNOMINATE UNI R MOD SED  03/02/2019  . IR CT HEAD LTD  03/02/2019  . IR INTRAVSC STENT CERV CAROTID W/O EMB-PROT MOD SED INC ANGIO  03/02/2019  . IR PERCUTANEOUS ART THROMBECTOMY/INFUSION INTRACRANIAL INC DIAG ANGIO  03/02/2019  . LYMPHADENECTOMY    . MASTECTOMY Bilateral   . RADIOLOGY WITH ANESTHESIA N/A 03/02/2019   Procedure: IR WITH ANESTHESIA;  Surgeon: Luanne Bras, MD;  Location: [place];  Service: Radiology;  Laterality: N/A;    There were no vitals filed for this visit.   Subjective Assessment - 10/21/19 1630    Currently in Pain? Yes    Pain Score 6     Pain  Location Shoulder    Pain Orientation Left    Pain Descriptors / Indicators Aching    Pain Type Chronic pain    Pain Onset More than a month ago    Pain Frequency Intermittent    Aggravating Factors  movement    Pain Relieving Factors heat                    Treatment: Pt reports having a nerve block yesterday afternoon. Pt reports continued significant pain.  Hot pack applied to left shoulder x 8 mins due to pain, no adverse reactions. Soft tissue and joint mobs at shoulder followed by AA/ROM shoulder flexion and abduction in supine, then closed chain shoulder flexion in supine AA/ROm, min facilitation for scapula and shoulder positioning. Pt was instructed in cane exercises HEP see pt instructions. Arm bike x 5 mins level 1 for conditioning/ reciprocal movement.            OT Education - 10/21/19 0845    Education Details Reviewed shoulder flexion in supine and added cane exercises to HEP, 10 reps each, see pt instructions.    Person(s) Educated Patient    Methods Explanation;Demonstration;Verbal cues;Handout    Comprehension Verbalized  understanding;Verbal cues required;Returned demonstration;Need further instruction            OT Short Term Goals - 09/25/19 1815      OT SHORT TERM GOAL #1   Title Pt will be independent with initial HEP.--check STGs 10/25/19    Time 4    Period Weeks    Status New      OT SHORT TERM GOAL #2   Title Pt will demo at least 70* L shoulder flexion for functional reaching without pain.    Baseline 40*    Time 4    Period Weeks    Status New      OT SHORT TERM GOAL #3   Title Pt will verbalize understanding of proper positioning of LUE for decr pain.    Time 4    Period Weeks    Status New      OT SHORT TERM GOAL #4   Title Pt will be able to use LUE in simple light home maintenance tasks as non-dominant assist at least 50% of the time with pain less than 3/10 (wiping table, folding clothes, sweeping).    Time 4     Period Weeks    Status New      OT SHORT TERM GOAL #5   Title Pt will demo improved LUE functional reaching for ADLs as shown by improving score on box and blocks test by at least 6.    Baseline 28 blocks    Time 4    Period Weeks    Status New      OT SHORT TERM GOAL #6   Title Pt will verbalize understanding of adaptive strategies/AE to incr independence with cooking tasks.    Time 4    Period Weeks    Status New             OT Long Term Goals - 09/25/19 1820      OT LONG TERM GOAL #1   Title Pt will be independent with updated HEP for LUE--check LTGs 11/24/19    Time 8    Period Weeks    Status New      OT LONG TERM GOAL #2   Title Pt will demo at least 90* L shoulder flex without pain for functional reaching.    Baseline 40*    Time 8    Period Weeks    Status New      OT LONG TERM GOAL #3   Title Pt will be able to use LUE as nondominant assist for functional tasks at least 75% of the time for light ADLs and IADLs with pain consistently less than or equal to 2/10.    Time 8    Period Weeks    Status New      OT LONG TERM GOAL #4   Title Pt will improve L grip strength by at least 8lbs to assist in opening containers/home maintenance tasks.    Baseline 42.7lbs    Time 8    Period Weeks    Status New      OT LONG TERM GOAL #5   Title Pt will improve LUE coordination as shown by improving time on 9-hole peg test by at least 8sec.    Baseline 40.81sec    Time 8    Period Weeks    Status New      OT LONG TERM GOAL #6   Title Pt will improve LUE functional reaching for ADLs as shown by improving score  on box and blocks test by at least 10.    Baseline 28    Time 8    Period Weeks    Status New                 Plan - 10/21/19 0846    Clinical Impression Statement Pt was diagnosed with adhesive capsulitis  and MD orders are for agressive P/ROM as tolerated(limited by pain).    OT Occupational Profile and History Detailed Assessment- Review of  Records and additional review of physical, cognitive, psychosocial history related to current functional performance    Occupational performance deficits (Please refer to evaluation for details): IADL's;ADL's;Leisure    Body Structure / Function / Physical Skills ADL;Balance;IADL;ROM;Strength;Tone;FMC;Coordination;UE functional use;Decreased knowledge of precautions;GMC;Decreased knowledge of use of DME;Pain    Rehab Potential Good    Clinical Decision Making Several treatment options, min-mod task modification necessary    Comorbidities Affecting Occupational Performance: May have comorbidities impacting occupational performance    Modification or Assistance to Complete Evaluation  Min-Moderate modification of tasks or assist with assess necessary to complete eval    OT Frequency --   eval + 3x week for 4 weeks followed by 2x week for 4 weeks or 21 visits total.   OT Treatment/Interventions Self-care/ADL training;Moist Heat;DME and/or AE instruction;Therapeutic activities;Aquatic Therapy;Therapeutic exercise;Ultrasound;Passive range of motion;Functional Mobility Training;Neuromuscular education;Cryotherapy;Electrical Stimulation;Manual Therapy;Patient/family education    Plan review HEP , review proper positioning of LUE, manual therapy to address L shoulder pain and neuro re-ed    Consulted and Agree with Plan of Care Patient           Patient will benefit from skilled therapeutic intervention in order to improve the following deficits and impairments:   Body Structure / Function / Physical Skills: ADL, Balance, IADL, ROM, Strength, Tone, FMC, Coordination, UE functional use, Decreased knowledge of precautions, GMC, Decreased knowledge of use of DME, Pain       Visit Diagnosis: Muscle weakness (generalized)  Abnormal posture  Hemiplegia and hemiparesis following cerebral infarction affecting left non-dominant side (HCC)  Other lack of coordination  Other symptoms and signs involving  the musculoskeletal system  Other symptoms and signs involving the nervous system  Acute pain of left shoulder    Problem List Patient Active Problem List   Diagnosis Date Noted  . Vascular headache   . Other chronic pain   . Uncontrolled type 2 diabetes mellitus with hyperglycemia ([place])   . Labile blood glucose   . Panic disorder with agoraphobia and moderate panic attacks   . Major depressive disorder, recurrent episode, moderate ([place])   . MCI (mild cognitive impairment) with memory loss   . Acute ischemic right middle cerebral artery (MCA) stroke ([place]) 03/09/2019  . Left hemiparesis ([place])   . Bradycardia   . Tachypnea   . SIRS (systemic inflammatory response syndrome) (H[facility])   . Hypokalemia   . Diabetes mellitus type 2 in obese ([place])   . Tobacco abuse   . History of breast cancer   . Cognitive deficits   . Dysphagia, post-stroke   . Stroke (cerebrum) ([place]) 03/02/2019  . Middle cerebral artery embolism, right 03/02/2019  . Chest pain 12/19/2015  . Numbness 12/19/2015  . Femoral hernia 03/24/2014  . Dyslipidemia 09/10/2011  . BRCA1 positive 09/10/2011  . Breast cancer ([place]) 09/10/2011  . H/O gastric bypass; 05/14/11([facility]) 09/10/2011  . Essential hypertension 10/28/2008  . DEPRESSION/ANXIETY 02/19/2007  . CARPAL TUNNEL SYNDROME, RIGHT 02/19/2007  . ALLERGIC RHINITIS 02/19/2007  .  GERD 02/19/2007  . IRRITABLE BOWEL SYNDROME 02/19/2007  . HERPES GENITALIS 12/30/2006  . Diabetes ([place]) 12/30/2006  . OSTEOPENIA 12/30/2006  . ANOMALY, CONGENITAL, NERVOUS SYSTEM NEC 12/30/2006    Ann Lopez 10/21/2019, 4:34 PM  [place] [address] [place], [place], [address] Phone: [phone]   Fax:  [phone]  Name: Ann Lopez MRN: [id number] Date of Birth: 26-Jan-1971

## 2019-10-22 ENCOUNTER — Encounter: Payer: Self-pay | Admitting: Occupational Therapy

## 2019-10-22 ENCOUNTER — Ambulatory Visit: Payer: Medicare Other | Admitting: Occupational Therapy

## 2019-10-22 DIAGNOSIS — R278 Other lack of coordination: Secondary | ICD-10-CM

## 2019-10-22 DIAGNOSIS — R29898 Other symptoms and signs involving the musculoskeletal system: Secondary | ICD-10-CM

## 2019-10-22 DIAGNOSIS — R293 Abnormal posture: Secondary | ICD-10-CM

## 2019-10-22 DIAGNOSIS — R2689 Other abnormalities of gait and mobility: Secondary | ICD-10-CM | POA: Diagnosis not present

## 2019-10-22 DIAGNOSIS — M25512 Pain in left shoulder: Secondary | ICD-10-CM

## 2019-10-22 DIAGNOSIS — I69354 Hemiplegia and hemiparesis following cerebral infarction affecting left non-dominant side: Secondary | ICD-10-CM

## 2019-10-22 DIAGNOSIS — R29818 Other symptoms and signs involving the nervous system: Secondary | ICD-10-CM

## 2019-10-22 NOTE — Therapy (Addendum)
[facility] [address] [place], [place], [address] Phone: [phone]   Fax:  [phone]  Occupational Therapy Treatment  Patient Details  Name: Ann Lopez MRN: [id number] Date of Birth: May 27, 1970 Referring Provider (OT): Dr. Leeroy Cha   Encounter Date: 10/22/2019   OT End of Session - 10/22/19 1453    Visit Number 3    Number of Visits 21    Authorization Type [facility] Medicare/ Medicaid    Authorization - Number of Visits 3    Progress Note Due on Visit 10    OT Start Time 1105    OT Stop Time 1145    OT Time Calculation (min) 40 min    Activity Tolerance Patient limited by pain    Behavior During Therapy [facility] for tasks assessed/performed           Past Medical History:  Diagnosis Date  . Allergy   . Anxiety   . Asthma   . Back pain   . Breast cancer ([place])   . Cancer ([place])   . Depression   . Diabetes mellitus   . Headache   . Hyperlipemia   . Osteopenia   . Osteoporosis    osteopenia  . Stroke [facility])     Past Surgical History:  Procedure Laterality Date  . ABDOMINAL HYSTERECTOMY     B oophorectomy for BRCA1 gene  . BREAST SURGERY    . CESAREAN SECTION    . INCISION AND DRAINAGE ABSCESS Left 11/14/2012   Procedure: INCISION AND DRAINAGE ABSCESS LEFT GROIN;  Surgeon: Jamesetta So, MD;  Location: [facility];  Service: General;  Laterality: Left;  . IR ANGIO VERTEBRAL SEL SUBCLAVIAN INNOMINATE UNI R MOD SED  03/02/2019  . IR CT HEAD LTD  03/02/2019  . IR INTRAVSC STENT CERV CAROTID W/O EMB-PROT MOD SED INC ANGIO  03/02/2019  . IR PERCUTANEOUS ART THROMBECTOMY/INFUSION INTRACRANIAL INC DIAG ANGIO  03/02/2019  . LYMPHADENECTOMY    . MASTECTOMY Bilateral   . RADIOLOGY WITH ANESTHESIA N/A 03/02/2019   Procedure: IR WITH ANESTHESIA;  Surgeon: Luanne Bras, MD;  Location: [facility];  Service: Radiology;  Laterality: N/A;    There were no vitals filed for this visit.   Subjective Assessment -  10/22/19 1109    Subjective  Pt reports that pain is better after last session.  Pt had injection/nerve block earlier this week.    Pertinent History R MCA  stroke with L shoulder pain.  PMH:  diabetes, anxiety, depression hyperlipidemia, smoking, breast cancer s/p bilateral mastectomy followed by chmo in 2009, R carotid stenosis requiring emergent thrombectomy    Patient Stated Goals improve L shoudler pain and ROM, be able to care for grandchild alone    Currently in Pain? Yes    Pain Score 2     Pain Location Shoulder    Pain Orientation Left    Pain Descriptors / Indicators Aching    Pain Type Chronic pain    Pain Onset More than a month ago    Pain Frequency Intermittent    Aggravating Factors  movement    Pain Relieving Factors heat               Soft tissue and joint mobs at shoulder followed by Ultrasound to lateral and anterior L shoulder x51mn 136m, 1.0wts/cm2 and 20% pulsed with no adverse reactions.   Closed chain shoulder flexion, abduction in supine with foam noodle with min facilitation for scapula and shoulder positioning.  Followed by shoulder ER and shoulder extension with scapular retraction in sitting/standing.  Sitting, gentle AAROM shoulder flex, abduction, and circumduction with min facilitation in low-range with ball.    Kinesiotaped L deltoid to relax and correction piece at shoulder for ER/create space.                 OT Education - 10/22/19 1453    Education Details Kinesiotape wear/care and precautions    Person(s) Educated Patient    Methods Explanation    Comprehension Verbalized understanding            OT Short Term Goals - 10/22/19 1455      OT SHORT TERM GOAL #1   Title Pt will be independent with initial HEP.--check STGs 11/13/19 (extended due to scheduling)    Time 4    Period Weeks    Status New      OT SHORT TERM GOAL #2   Title Pt will demo at least 70* L shoulder flexion for functional reaching without pain.     Baseline 40*    Time 4    Period Weeks    Status New      OT SHORT TERM GOAL #3   Title Pt will verbalize understanding of proper positioning of LUE for decr pain.    Time 4    Period Weeks    Status New      OT SHORT TERM GOAL #4   Title Pt will be able to use LUE in simple light home maintenance tasks as non-dominant assist at least 50% of the time with pain less than 3/10 (wiping table, folding clothes, sweeping).    Time 4    Period Weeks    Status New      OT SHORT TERM GOAL #5   Title Pt will demo improved LUE functional reaching for ADLs as shown by improving score on box and blocks test by at least 6.    Baseline 28 blocks    Time 4    Period Weeks    Status New      OT SHORT TERM GOAL #6   Title Pt will verbalize understanding of adaptive strategies/AE to incr independence with cooking tasks.    Time 4    Period Weeks    Status New             OT Long Term Goals - 09/25/19 1820      OT LONG TERM GOAL #1   Title Pt will be independent with updated HEP for LUE--check LTGs 11/24/19    Time 8    Period Weeks    Status New      OT LONG TERM GOAL #2   Title Pt will demo at least 90* L shoulder flex without pain for functional reaching.    Baseline 40*    Time 8    Period Weeks    Status New      OT LONG TERM GOAL #3   Title Pt will be able to use LUE as nondominant assist for functional tasks at least 75% of the time for light ADLs and IADLs with pain consistently less than or equal to 2/10.    Time 8    Period Weeks    Status New      OT LONG TERM GOAL #4   Title Pt will improve L grip strength by at least 8lbs to assist in opening containers/home maintenance tasks.    Baseline 42.7lbs  Time 8    Period Weeks    Status New      OT LONG TERM GOAL #5   Title Pt will improve LUE coordination as shown by improving time on 9-hole peg test by at least 8sec.    Baseline 40.81sec    Time 8    Period Weeks    Status New      OT LONG TERM GOAL #6    Title Pt will improve LUE functional reaching for ADLs as shown by improving score on box and blocks test by at least 10.    Baseline 28    Time 8    Period Weeks    Status New                 Plan - 10/22/19 1454    Clinical Impression Statement Pt reports improved pain after last session and improved pain (none) by end of session today.  Pt also demo incr ease with LUE movement and arm swing while walking.    OT Occupational Profile and History Detailed Assessment- Review of Records and additional review of physical, cognitive, psychosocial history related to current functional performance    Occupational performance deficits (Please refer to evaluation for details): IADL's;ADL's;Leisure    Body Structure / Function / Physical Skills ADL;Balance;IADL;ROM;Strength;Tone;FMC;Coordination;UE functional use;Decreased knowledge of precautions;GMC;Decreased knowledge of use of DME;Pain    Rehab Potential Good    Clinical Decision Making Several treatment options, min-mod task modification necessary    Comorbidities Affecting Occupational Performance: May have comorbidities impacting occupational performance    Modification or Assistance to Complete Evaluation  Min-Moderate modification of tasks or assist with assess necessary to complete eval    OT Frequency --   eval + 3x week for 4 weeks followed by 2x week for 4 weeks or 21 visits total.   OT Treatment/Interventions Self-care/ADL training;Moist Heat;DME and/or AE instruction;Therapeutic activities;Aquatic Therapy;Therapeutic exercise;Ultrasound;Passive range of motion;Functional Mobility Training;Neuromuscular education;Cryotherapy;Electrical Stimulation;Manual Therapy;Patient/family education    Plan continue with proper positioning of LUE, manual therapy to address L shoulder pain and neuro re-ed, ?ultrasound or kinesiotaping    Consulted and Agree with Plan of Care Patient           Patient will benefit from skilled therapeutic  intervention in order to improve the following deficits and impairments:   Body Structure / Function / Physical Skills: ADL, Balance, IADL, ROM, Strength, Tone, FMC, Coordination, UE functional use, Decreased knowledge of precautions, GMC, Decreased knowledge of use of DME, Pain       Visit Diagnosis: Hemiplegia and hemiparesis following cerebral infarction affecting left non-dominant side (HCC)  Abnormal posture  Other lack of coordination  Other symptoms and signs involving the musculoskeletal system  Other symptoms and signs involving the nervous system  Acute pain of left shoulder    Problem List Patient Active Problem List   Diagnosis Date Noted  . Vascular headache   . Other chronic pain   . Uncontrolled type 2 diabetes mellitus with hyperglycemia ([place])   . Labile blood glucose   . Panic disorder with agoraphobia and moderate panic attacks   . Major depressive disorder, recurrent episode, moderate ([place])   . MCI (mild cognitive impairment) with memory loss   . Acute ischemic right middle cerebral artery (MCA) stroke ([place]) 03/09/2019  . Left hemiparesis ([place])   . Bradycardia   . Tachypnea   . SIRS (systemic inflammatory response syndrome) (HCC)   . Hypokalemia   . Diabetes mellitus type 2 in  obese ([place])   . Tobacco abuse   . History of breast cancer   . Cognitive deficits   . Dysphagia, post-stroke   . Stroke (cerebrum) ([place]) 03/02/2019  . Middle cerebral artery embolism, right 03/02/2019  . Chest pain 12/19/2015  . Numbness 12/19/2015  . Femoral hernia 03/24/2014  . Dyslipidemia 09/10/2011  . BRCA1 positive 09/10/2011  . Breast cancer ([place]) 09/10/2011  . H/O gastric bypass; 05/14/11([facility]) 09/10/2011  . Essential hypertension 10/28/2008  . DEPRESSION/ANXIETY 02/19/2007  . CARPAL TUNNEL SYNDROME, RIGHT 02/19/2007  . ALLERGIC RHINITIS 02/19/2007  . GERD 02/19/2007  . IRRITABLE BOWEL SYNDROME 02/19/2007  . HERPES GENITALIS 12/30/2006  . Diabetes ([place])  12/30/2006  . OSTEOPENIA 12/30/2006  . ANOMALY, CONGENITAL, NERVOUS SYSTEM NEC 12/30/2006    [facility] 10/22/2019, 2:56 PM  [place] [address] [place], [place], [address] Phone: [phone]   Fax:  [phone]  Name: Ann Lopez MRN: [id number] Date of Birth: 1971-03-21   Vianne Bulls, OTR/L [facility] [address] [place], [place]  [address] [phone] phone [phone] 10/22/19 2:59 PM

## 2019-10-23 ENCOUNTER — Ambulatory Visit: Payer: Medicare Other | Admitting: Physical Therapy

## 2019-10-23 ENCOUNTER — Other Ambulatory Visit: Payer: Self-pay

## 2019-10-23 DIAGNOSIS — R2689 Other abnormalities of gait and mobility: Secondary | ICD-10-CM | POA: Diagnosis not present

## 2019-10-23 DIAGNOSIS — R293 Abnormal posture: Secondary | ICD-10-CM

## 2019-10-23 DIAGNOSIS — M6281 Muscle weakness (generalized): Secondary | ICD-10-CM

## 2019-10-23 NOTE — Therapy (Signed)
[facility] [address] [place], [place], [address] Phone: [phone]   Fax:  [phone]  Physical Therapy Treatment  Patient Details  Name: Ann Lopez MRN: [id number] Date of Birth: [birth year] Referring Provider (PT): Dr. Leeroy Cha   Encounter Date: 10/23/2019   PT End of Session - 10/23/19 0851    Visit Number 14    Number of Visits 26    Date for PT Re-Evaluation 12/23/19    Authorization Type [employer] Medicare/Medicaid    Authorization Time Period 09/24/2019-12/23/2019    Progress Note Due on Visit 20    PT Start Time 0848    PT Stop Time 0929    PT Time Calculation (min) 41 min    Equipment Utilized During Treatment --    Activity Tolerance Patient tolerated treatment well   No increase in shoulder pain; slight increase to 2/10 in back pain   Behavior During Therapy [facility] for tasks assessed/performed           Past Medical History:  Diagnosis Date  . Allergy   . Anxiety   . Asthma   . Back pain   . Breast cancer ([place])   . Cancer ([place])   . Depression   . Diabetes mellitus   . Headache   . Hyperlipemia   . Osteopenia   . Osteoporosis    osteopenia  . Stroke [facility])     Past Surgical History:  Procedure Laterality Date  . ABDOMINAL HYSTERECTOMY     B oophorectomy for BRCA1 gene  . BREAST SURGERY    . CESAREAN SECTION    . INCISION AND DRAINAGE ABSCESS Left 11/14/2012   Procedure: INCISION AND DRAINAGE ABSCESS LEFT GROIN;  Surgeon: Jamesetta So, MD;  Location: [facility];  Service: General;  Laterality: Left;  . IR ANGIO VERTEBRAL SEL SUBCLAVIAN INNOMINATE UNI R MOD SED  03/02/2019  . IR CT HEAD LTD  03/02/2019  . IR INTRAVSC STENT CERV CAROTID W/O EMB-PROT MOD SED INC ANGIO  03/02/2019  . IR PERCUTANEOUS ART THROMBECTOMY/INFUSION INTRACRANIAL INC DIAG ANGIO  03/02/2019  . LYMPHADENECTOMY    . MASTECTOMY Bilateral   . RADIOLOGY WITH ANESTHESIA N/A 03/02/2019   Procedure: IR WITH ANESTHESIA;   Surgeon: Luanne Bras, MD;  Location: [facility];  Service: Radiology;  Laterality: N/A;    There were no vitals filed for this visit.   Subjective Assessment - 10/23/19 0850    Subjective Got the foot-up brace and I'm wearing it today; it feels different, so please make sure I've got it on right.    Pertinent History QMG:[id number], hyperlipidemia, smoking, breast cancer, R carotid stenosis requiring emergent thrombectomy    Patient Stated Goals Pt wants all the movement back in L side.    Currently in Pain? No/denies    Pain Onset More than a month ago                             [facility] Adult PT Treatment/Exercise - 10/23/19 0001      Ambulation/Gait   Ambulation/Gait Yes    Ambulation Distance (Feet) 115 Feet   x 2   Pre-Gait Activities Pt wore in her foot-up brace, worked with pt to readjust for optimal tightness and positioning of L foot-up brace.  Able to readjust to feel tighter (lowered in her shoe lace) and pt feels that it is more stable in positioning.      Neuro Re-ed  Neuro Re-ed Details  Standing beside counter, with mirror for visual feedback on posture:  LLE as stance with RLE propped on 4" aerobic step-head turns x 5, head nods x 5 with light to no UE support.  Additional rep of LLE as stance with RLE propped at 4" step, terminal knee extension LLE x 10 reps (cues to avoid L knee recurvatum).  LLE as stance with RLE step taps to 4" step and then to 4"/aerobic block step, x 10 reps each.  PT provides tactile cues at L trunk and R shoulder for neutral trunk positioning.        Knee/Hip Exercises: Standing   Lateral Step Up Both;1 set;15 reps;Hand Hold: 1;Step Height: 4";Limitations    Forward Step Up Right;Left;1 set;10 reps;Hand Hold: 1;Step Height: 4"    Forward Step Up Limitations Wearing foot-up brace; tactile cues for tall trunk on L side    Other Standing Knee Exercises Resisted sidestep R <>L, 2 sets x 10 reps, green theraband; on compliant beam, no  band, sidestep R<>L x 10 reps    Other Standing Knee Exercises [place] standing on balance beam x 10 reps with UE support at counter;  Tandem stance on beam, to work intrinsic lower leg muscles; 1 reps each foot poistion x 10 seconds, intermittent UE support      Ankle Exercises: Standing   Other Standing Ankle Exercises On balance beam, anterior/posterior weigthshifting through ankles (ankle dorsiflexion/plantarflexion x 5 reps)                    PT Short Term Goals - 10/21/19 0853      PT SHORT TERM GOAL #1   Title Pt will be independent with HEP for improved balance, strength, gait.  TARGET 5 weeks for all LTGs: 10/23/2019    Baseline 10/21/19: pt. verbalizes independence with HEP.    Time 5    Period Weeks    Status Achieved      PT SHORT TERM GOAL #2   Title Pt will improve 5x sit<>stand score to less than or equal to 12 seconds for improved functional strength.    Baseline 10/21/19: 12.31 seconds to complete 5x sit<>stand.    Time 5    Period Weeks    Status Partially Met      PT SHORT TERM GOAL #3   Title Pt will improve FGA score to at least 20/30 for decreased fall risk.    Baseline 10/21/19: pt. scored a 24/30 on the FGA today showing a decrease in fall risk.    Time 5    Period Weeks    Status Achieved      PT SHORT TERM GOAL #4   Title Pt will verbalize understanding of fall prevention in home environment.    Baseline 10/21/19: pt. verbalizes fall prevention techniques like using a rubber mat in the shower, and handrails when available.    Time 5    Period Weeks    Status Achieved             PT Long Term Goals - 09/24/19 1451      PT LONG TERM GOAL #1   Title Pt will be independent with progression of HEP for strength, balance, and gait.  TARGET 11/20/2019    Time 9    Period Weeks    Status New      PT LONG TERM GOAL #2   Title Pt will improve 5x sit<>stand score to less than or equal to 10  seconds for improved functional strength.    Time 9     Period Weeks    Status New      PT LONG TERM GOAL #3   Title Pt will improve Functional Gait Assessment score to at least 22/30 for decreased fall risk.    Time 9    Period Weeks    Status New      PT LONG TERM GOAL #4   Title Pt will improve gait velocity to at least 3 ft/sec for improved gait efficiency for community ambulation.    Time 9    Period Weeks    Status New      PT LONG TERM GOAL #5   Title Pt will ambulate at least 1000 ft, outdoor and indoor surfaces, appropriate device as needed, modified independently, for improved community gait.    Time 9    Period Weeks    Status New      Additional Long Term Goals   Additional Long Term Goals Yes      PT LONG TERM GOAL #6   Title FOTO score to improve by at least 10 % for improved overall reported functional mobility.    Time 9    Period Weeks    Status New                 Plan - 10/23/19 0945    Clinical Impression Statement Continued focus today on hip and lower leg strenghtening, utilizing varied heights and compliant surfaces.  She brings in her personal foot up today, worked to adjust it for optimal positioning.  She responds well to tactile cues for upright L trunk, but returns to L lateral trunk lean when cues removed.    Personal Factors and Comorbidities Comorbidity 3+    Comorbidities PMH:  diabetes, hyperlipidemia, smoking, breast cancer, R carotid stenosis requiring emergent thrombectomy, anxiety depression    Examination-Activity Limitations Locomotion Level;Transfers;Reach Overhead;Stand;Stairs;Lift;Dressing    Examination-Participation Restrictions Community Activity;Other   caring for grandchild   Stability/Clinical Decision Making Evolving/Moderate complexity    Rehab Potential Good    PT Frequency Other (comment)   1x/wk for 1 week, then 3x/wk for 8 weeks   PT Duration Other (comment)   9 weeks   PT Treatment/Interventions ADLs/Self Care Home Management;Electrical Stimulation;DME  Instruction;Gait training;Stair training;Functional mobility training;Therapeutic activities;Therapeutic exercise;Balance training;Neuromuscular re-education;Manual techniques;Orthotic Fit/Training;Patient/family education;Passive range of motion;Aquatic Therapy    PT Next Visit Plan gait training and SLS LLE weightbearing/trunk control activities; gait training, working towards [place] with Plan of Care Patient           Patient will benefit from skilled therapeutic intervention in order to improve the following deficits and impairments:  Abnormal gait, Decreased coordination, Difficulty walking, Impaired tone, Impaired UE functional use, Decreased activity tolerance, Decreased balance, Decreased mobility, Decreased strength, Postural dysfunction  Visit Diagnosis: Other abnormalities of gait and mobility  Muscle weakness (generalized)  Abnormal posture     Problem List Patient Active Problem List   Diagnosis Date Noted  . Vascular headache   . Other chronic pain   . Uncontrolled type 2 diabetes mellitus with hyperglycemia ([place])   . Labile blood glucose   . Panic disorder with agoraphobia and moderate panic attacks   . Major depressive disorder, recurrent episode, moderate ([place])   . MCI (mild cognitive impairment) with memory loss   . Acute ischemic right middle cerebral artery (MCA) stroke ([place]) 03/09/2019  . Left hemiparesis ([place])   .  Bradycardia   . Tachypnea   . SIRS (systemic inflammatory response syndrome) (HCC)   . Hypokalemia   . Diabetes mellitus type 2 in obese ([place])   . Tobacco abuse   . History of breast cancer   . Cognitive deficits   . Dysphagia, post-stroke   . Stroke (cerebrum) ([place]) 03/02/2019  . Middle cerebral artery embolism, right 03/02/2019  . Chest pain 12/19/2015  . Numbness 12/19/2015  . Femoral hernia 03/24/2014  . Dyslipidemia 09/10/2011  . BRCA1 positive 09/10/2011  . Breast cancer ([place]) 09/10/2011  . H/O gastric bypass;  05/14/11([facility]) 09/10/2011  . Essential hypertension 10/28/2008  . DEPRESSION/ANXIETY 02/19/2007  . CARPAL TUNNEL SYNDROME, RIGHT 02/19/2007  . ALLERGIC RHINITIS 02/19/2007  . GERD 02/19/2007  . IRRITABLE BOWEL SYNDROME 02/19/2007  . HERPES GENITALIS 12/30/2006  . Diabetes ([place]) 12/30/2006  . OSTEOPENIA 12/30/2006  . ANOMALY, CONGENITAL, NERVOUS SYSTEM NEC 12/30/2006    [facility] [place] [facility] 10/23/2019, 10:14 AM Frazier Butt., PT  [facility] [address] [place], [place], [address] Phone: [phone]   Fax:  [phone]  Name: Ann Lopez MRN: [id number] Date of Birth: [birth year]

## 2019-10-26 ENCOUNTER — Ambulatory Visit: Payer: Medicare Other | Admitting: Occupational Therapy

## 2019-10-26 ENCOUNTER — Other Ambulatory Visit: Payer: Self-pay

## 2019-10-26 DIAGNOSIS — R293 Abnormal posture: Secondary | ICD-10-CM

## 2019-10-26 DIAGNOSIS — R2689 Other abnormalities of gait and mobility: Secondary | ICD-10-CM | POA: Diagnosis not present

## 2019-10-26 DIAGNOSIS — M6281 Muscle weakness (generalized): Secondary | ICD-10-CM

## 2019-10-26 DIAGNOSIS — M25512 Pain in left shoulder: Secondary | ICD-10-CM

## 2019-10-26 DIAGNOSIS — R2681 Unsteadiness on feet: Secondary | ICD-10-CM

## 2019-10-26 DIAGNOSIS — R29898 Other symptoms and signs involving the musculoskeletal system: Secondary | ICD-10-CM

## 2019-10-26 DIAGNOSIS — R29818 Other symptoms and signs involving the nervous system: Secondary | ICD-10-CM

## 2019-10-26 DIAGNOSIS — R278 Other lack of coordination: Secondary | ICD-10-CM

## 2019-10-26 DIAGNOSIS — I69354 Hemiplegia and hemiparesis following cerebral infarction affecting left non-dominant side: Secondary | ICD-10-CM

## 2019-10-26 NOTE — Therapy (Signed)
[facility] [address] [place], [place], [address] Phone: [phone]   Fax:  [phone]  Occupational Therapy Treatment  Patient Details  Name: Ann Lopez MRN: [id number] Date of Birth: [birth year] Referring Provider (OT): Dr. Leeroy Cha   Encounter Date: 10/26/2019   OT End of Session - 10/26/19 1216    Visit Number 4    Number of Visits 21    Date for OT Re-Evaluation 11/24/19    Authorization Type UHC Medicare/ Medicaid, covered 100% (medicare guidelines)    Authorization - Number of Visits 4    Progress Note Due on Visit 10    OT Start Time 1107    OT Stop Time 1155    OT Time Calculation (min) 48 min    Activity Tolerance Patient limited by pain    Behavior During Therapy [facility] for tasks assessed/performed           Past Medical History:  Diagnosis Date  . Allergy   . Anxiety   . Asthma   . Back pain   . Breast cancer ([place])   . Cancer ([place])   . Depression   . Diabetes mellitus   . Headache   . Hyperlipemia   . Osteopenia   . Osteoporosis    osteopenia  . Stroke [facility])     Past Surgical History:  Procedure Laterality Date  . ABDOMINAL HYSTERECTOMY     B oophorectomy for BRCA1 gene  . BREAST SURGERY    . CESAREAN SECTION    . INCISION AND DRAINAGE ABSCESS Left 11/14/2012   Procedure: INCISION AND DRAINAGE ABSCESS LEFT GROIN;  Surgeon: Jamesetta So, MD;  Location: [facility];  Service: General;  Laterality: Left;  . IR ANGIO VERTEBRAL SEL SUBCLAVIAN INNOMINATE UNI R MOD SED  03/02/2019  . IR CT HEAD LTD  03/02/2019  . IR INTRAVSC STENT CERV CAROTID W/O EMB-PROT MOD SED INC ANGIO  03/02/2019  . IR PERCUTANEOUS ART THROMBECTOMY/INFUSION INTRACRANIAL INC DIAG ANGIO  03/02/2019  . LYMPHADENECTOMY    . MASTECTOMY Bilateral   . RADIOLOGY WITH ANESTHESIA N/A 03/02/2019   Procedure: IR WITH ANESTHESIA;  Surgeon: Luanne Bras, MD;  Location: [place];  Service: Radiology;  Laterality: N/A;      There were no vitals filed for this visit.   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  No pain today.    Pertinent History R MCA  stroke with L shoulder pain.  PMH:  diabetes, anxiety, depression hyperlipidemia, smoking, breast cancer s/p bilateral mastectomy followed by chmo in 2009, R carotid stenosis requiring emergent thrombectomy    Patient Stated Goals improve L shoudler pain and ROM, be able to care for grandchild alone    Currently in Pain? No/denies    Pain Onset More than a month ago             Ultrasound to lateral and anterior L shoulder x34mn 166m, 1.0wts/cm2 and 50% pulsed with no adverse reactions for pain/stiffness.   Kinesiotaped L shoulder (deltoid to relax, upper trap to relax, and shoulder correction piece for ER).   Soft tissue and joint mobs at shoulder.  Followed by closed chain shoulder flexion, chest press with scapular retraction/depression in supine with foam noodle with min facilitation for scapula and shoulder positioning.  Followed by shoulder ER in supine with min facilitation for positioning.    Sitting,  shoulder abduction closed-chain and scapular retraction/depression with min facilitation and cueing for breathing and decr neck activation.    Open-chain shoulder flex with min cueing/facilitation for scapular movement and positioning.      OT Short Term Goals - 10/22/19 1455      OT SHORT TERM GOAL #1   Title Pt will be independent with initial HEP.--check STGs 11/13/19 (extended due to scheduling)    Time 4    Period Weeks    Status New      OT SHORT TERM GOAL #2   Title Pt will demo at least 70* L shoulder flexion for functional reaching without pain.    Baseline 40*    Time 4    Period Weeks    Status New      OT SHORT TERM GOAL #3   Title Pt will verbalize understanding of proper positioning of LUE for decr pain.    Time 4     Period Weeks    Status New      OT SHORT TERM GOAL #4   Title Pt will be able to use LUE in simple light home maintenance tasks as non-dominant assist at least 50% of the time with pain less than 3/10 (wiping table, folding clothes, sweeping).    Time 4    Period Weeks    Status New      OT SHORT TERM GOAL #5   Title Pt will demo improved LUE functional reaching for ADLs as shown by improving score on box and blocks test by at least 6.    Baseline 28 blocks    Time 4    Period Weeks    Status New      OT SHORT TERM GOAL #6   Title Pt will verbalize understanding of adaptive strategies/AE to incr independence with cooking tasks.    Time 4    Period Weeks    Status New             OT Long Term Goals - 09/25/19 1820      OT LONG TERM GOAL #1   Title Pt will be independent with updated HEP for LUE--check LTGs 11/24/19    Time 8    Period Weeks    Status New      OT LONG TERM GOAL #2   Title Pt will demo at least 90* L shoulder flex without pain for functional reaching.    Baseline 40*    Time 8    Period Weeks    Status New      OT LONG TERM GOAL #3   Title Pt will be able to use LUE as nondominant assist for functional tasks at least 75% of the time for light ADLs and IADLs with pain consistently less than or equal to 2/10.    Time 8    Period Weeks    Status New      OT LONG TERM GOAL #4   Title Pt will improve L grip strength by at least 8lbs to assist in opening containers/home maintenance tasks.    Baseline 42.7lbs    Time 8    Period Weeks    Status New      OT LONG TERM GOAL #5   Title Pt will improve LUE coordination as shown by improving time on 9-hole peg test by at least 8sec.    Baseline 40.81sec    Time 8    Period Weeks    Status New  OT LONG TERM GOAL #6   Title Pt will improve LUE functional reaching for ADLs as shown by improving score on box and blocks test by at least 10.    Baseline 28    Time 8    Period Weeks    Status New                  Plan - 10/26/19 1217    Clinical Impression Statement Pt reports several days without any pain and reports no pain at rest today.  Pt also demo improved ROM and reports improved functional use.    OT Occupational Profile and History Detailed Assessment- Review of Records and additional review of physical, cognitive, psychosocial history related to current functional performance    Occupational performance deficits (Please refer to evaluation for details): IADL's;ADL's;Leisure    Body Structure / Function / Physical Skills ADL;Balance;IADL;ROM;Strength;Tone;FMC;Coordination;UE functional use;Decreased knowledge of precautions;GMC;Decreased knowledge of use of DME;Pain    Rehab Potential Good    Clinical Decision Making Several treatment options, min-mod task modification necessary    Comorbidities Affecting Occupational Performance: May have comorbidities impacting occupational performance    Modification or Assistance to Complete Evaluation  Min-Moderate modification of tasks or assist with assess necessary to complete eval    OT Frequency --   eval + 3x week for 4 weeks followed by 2x week for 4 weeks or 21 visits total.   OT Treatment/Interventions Self-care/ADL training;Moist Heat;DME and/or AE instruction;Therapeutic activities;Aquatic Therapy;Therapeutic exercise;Ultrasound;Passive range of motion;Functional Mobility Training;Neuromuscular education;Cryotherapy;Electrical Stimulation;Manual Therapy;Patient/family education    Plan continue with proper positioning of LUE, manual therapy prn to address L shoulder pain and neuro re-ed (L shoulder/scapular/trunk), ?ultrasound and/or kinesiotaping    Consulted and Agree with Plan of Care Patient           Patient will benefit from skilled therapeutic intervention in order to improve the following deficits and impairments:   Body Structure / Function / Physical Skills: ADL, Balance, IADL, ROM, Strength, Tone, FMC, Coordination,  UE functional use, Decreased knowledge of precautions, GMC, Decreased knowledge of use of DME, Pain       Visit Diagnosis: Acute pain of left shoulder  Hemiplegia and hemiparesis following cerebral infarction affecting left non-dominant side (HCC)  Abnormal posture  Muscle weakness (generalized)  Other abnormalities of gait and mobility  Other lack of coordination  Other symptoms and signs involving the musculoskeletal system  Other symptoms and signs involving the nervous system  Unsteadiness on feet    Problem List Patient Active Problem List   Diagnosis Date Noted  . Vascular headache   . Other chronic pain   . Uncontrolled type 2 diabetes mellitus with hyperglycemia ([place])   . Labile blood glucose   . Panic disorder with agoraphobia and moderate panic attacks   . Major depressive disorder, recurrent episode, moderate ([place])   . MCI (mild cognitive impairment) with memory loss   . Acute ischemic right middle cerebral artery (MCA) stroke ([place]) 03/09/2019  . Left hemiparesis ([place])   . Bradycardia   . Tachypnea   . SIRS (systemic inflammatory response syndrome) (HCC)   . Hypokalemia   . Diabetes mellitus type 2 in obese ([place])   . Tobacco abuse   . History of breast cancer   . Cognitive deficits   . Dysphagia, post-stroke   . Stroke (cerebrum) ([place]) 03/02/2019  . Middle cerebral artery embolism, right 03/02/2019  . Chest pain 12/19/2015  . Numbness 12/19/2015  . Femoral hernia 03/24/2014  .  Dyslipidemia 09/10/2011  . BRCA1 positive 09/10/2011  . Breast cancer ([place]) 09/10/2011  . H/O gastric bypass; 05/14/11([facility]) 09/10/2011  . Essential hypertension 10/28/2008  . DEPRESSION/ANXIETY 02/19/2007  . CARPAL TUNNEL SYNDROME, RIGHT 02/19/2007  . ALLERGIC RHINITIS 02/19/2007  . GERD 02/19/2007  . IRRITABLE BOWEL SYNDROME 02/19/2007  . HERPES GENITALIS 12/30/2006  . Diabetes ([place]) 12/30/2006  . OSTEOPENIA 12/30/2006  . ANOMALY, CONGENITAL, NERVOUS SYSTEM NEC  12/30/2006    [facility] 10/26/2019, 12:19 PM  [facility] [address] [place], [place], [address] Phone: [phone]   Fax:  [phone]  Name: Ann Lopez MRN: [id number] Date of Birth: Dec 17, 1970   Vianne Bulls, OTR/L [facility] [address] [place], [place]  [address] [phone] phone [phone] 10/26/19 12:19 PM

## 2019-10-28 ENCOUNTER — Ambulatory Visit: Payer: Medicare Other | Admitting: Physical Therapy

## 2019-10-28 ENCOUNTER — Encounter: Payer: Self-pay | Admitting: Physical Therapy

## 2019-10-28 ENCOUNTER — Ambulatory Visit: Payer: Medicare Other | Admitting: Occupational Therapy

## 2019-10-28 ENCOUNTER — Other Ambulatory Visit: Payer: Self-pay

## 2019-10-28 DIAGNOSIS — M6281 Muscle weakness (generalized): Secondary | ICD-10-CM

## 2019-10-28 DIAGNOSIS — R2689 Other abnormalities of gait and mobility: Secondary | ICD-10-CM | POA: Diagnosis not present

## 2019-10-28 DIAGNOSIS — R2681 Unsteadiness on feet: Secondary | ICD-10-CM

## 2019-10-28 NOTE — Therapy (Signed)
[facility] [address] [place], [place], [address] Phone: [phone]   Fax:  [phone]  Physical Therapy Treatment  Patient Details  Name: Ann Lopez MRN: [id number] Date of Birth: [birth year] Referring Provider (PT): Dr. Leeroy Cha   Encounter Date: 10/28/2019   PT End of Session - 10/28/19 1912    Visit Number 15    Number of Visits 26    Date for PT Re-Evaluation 12/23/19    Authorization Type UHC Medicare/Medicaid    Authorization Time Period 09/24/2019-12/23/2019    Progress Note Due on Visit 20    PT Start Time 1500    PT Stop Time 1545    PT Time Calculation (min) 45 min    Equipment Utilized During Treatment Other (comment)   pool noodle, ankle cuffs, arm bar bells   Activity Tolerance Patient tolerated treatment well   No increase in shoulder pain; slight increase to 2/10 in back pain   Behavior During Therapy [facility] for tasks assessed/performed           Past Medical History:  Diagnosis Date  . Allergy   . Anxiety   . Asthma   . Back pain   . Breast cancer ([place])   . Cancer ([place])   . Depression   . Diabetes mellitus   . Headache   . Hyperlipemia   . Osteopenia   . Osteoporosis    osteopenia  . Stroke [facility])     Past Surgical History:  Procedure Laterality Date  . ABDOMINAL HYSTERECTOMY     B oophorectomy for BRCA1 gene  . BREAST SURGERY    . CESAREAN SECTION    . INCISION AND DRAINAGE ABSCESS Left 11/14/2012   Procedure: INCISION AND DRAINAGE ABSCESS LEFT GROIN;  Surgeon: Jamesetta So, MD;  Location: [facility];  Service: General;  Laterality: Left;  . IR ANGIO VERTEBRAL SEL SUBCLAVIAN INNOMINATE UNI R MOD SED  03/02/2019  . IR CT HEAD LTD  03/02/2019  . IR INTRAVSC STENT CERV CAROTID W/O EMB-PROT MOD SED INC ANGIO  03/02/2019  . IR PERCUTANEOUS ART THROMBECTOMY/INFUSION INTRACRANIAL INC DIAG ANGIO  03/02/2019  . LYMPHADENECTOMY    . MASTECTOMY Bilateral   . RADIOLOGY WITH  ANESTHESIA N/A 03/02/2019   Procedure: IR WITH ANESTHESIA;  Surgeon: Luanne Bras, MD;  Location: [facility];  Service: Radiology;  Laterality: N/A;    There were no vitals filed for this visit.   Subjective Assessment - 10/28/19 1909    Subjective Had L shoulder injected and reports relief after OT sessions.  Denies any other changes.    Pertinent History IHW:[id number], hyperlipidemia, smoking, breast cancer, R carotid stenosis requiring emergent thrombectomy    Patient Stated Goals Pt wants all the movement back in L side.    Currently in Pain? No/denies   L shoulder with increased rom but did not rate   Pain Onset More than a month ago           Aquatic therapy at [facility] - pool temp 87.4 degrees  Patient seen for aquatic therapy today. Treatment took place in water 4 feet deep depending upon activity. Pt entered and exited the pool viasteps with use ofright hand rail withSBA.  Pt performed hamstring stretch (runner's stretch) for each leg - 1 rep each - 30 sec hold  Seated on pool bench to perform sit<>stand x 10 reps with 1 UE support. In seated position performed bil LAQ with 1 UE assist to remain seated on  bench  In squat position with back against pool wall performed bil shoulder protraction/retraction x 10 reps x 2 sets with pool noodle then 10 reps x 2 with arm bar bells slightly under water.. Bil shoulder circles both directions x 10 reps with arms held at approx. 70 degress flexion.  Pt gait trained 1macross pool2reps, backwards amb 235m 2,sideways amb.2526m2Reps.  Side step squat x 70m57m. Forwards gait 70m 5mworking on arm swing/opposite leg then backwards for same.  Forward step weight shift x 15 reps x each side with 1 UE support  Squats x 10 reps with1UE support on pool wall: Unilateral squats on each leg - 10 reps RLE, 10 reps LLE with1UE support for closed chain strengthening   Standing with 1 UE support of pool edge with L ankle buoyancy  cuff for L hip flexion, extension, hip abd, hip add with pool wall as target, SLR all x 10 reps.   Ai Chi posture of enclosing and soothing x 15 reps.  Pt requires the buoyancy of water for active assisted exercises with buoyancy supported for strengthening & ROM exercises: pt requires the viscosity of the water for resistance with strengthening exercises Gait training can be performed in water with minimal fall risk in the water  Water current provides perturbations which challengebalance with activities.    PT Short Term Goals - 10/21/19 0853      PT SHORT TERM GOAL #1   Title Pt will be independent with HEP for improved balance, strength, gait.  TARGET 5 weeks for all LTGs: 10/23/2019    Baseline 10/21/19: pt. verbalizes independence with HEP.    Time 5    Period Weeks    Status Achieved      PT SHORT TERM GOAL #2   Title Pt will improve 5x sit<>stand score to less than or equal to 12 seconds for improved functional strength.    Baseline 10/21/19: 12.31 seconds to complete 5x sit<>stand.    Time 5    Period Weeks    Status Partially Met      PT SHORT TERM GOAL #3   Title Pt will improve FGA score to at least 20/30 for decreased fall risk.    Baseline 10/21/19: pt. scored a 24/30 on the FGA today showing a decrease in fall risk.    Time 5    Period Weeks    Status Achieved      PT SHORT TERM GOAL #4   Title Pt will verbalize understanding of fall prevention in home environment.    Baseline 10/21/19: pt. verbalizes fall prevention techniques like using a rubber mat in the shower, and handrails when available.    Time 5    Period Weeks    Status Achieved             PT Long Term Goals - 09/24/19 1451      PT LONG TERM GOAL #1   Title Pt will be independent with progression of HEP for strength, balance, and gait.  TARGET 11/20/2019    Time 9    Period Weeks    Status New      PT LONG TERM GOAL #2   Title Pt will improve 5x sit<>stand score to less than or equal to 10  seconds for improved functional strength.    Time 9    Period Weeks    Status New      PT LONG TERM GOAL #3   Title  Pt will improve Functional Gait Assessment score to at least 22/30 for decreased fall risk.    Time 9    Period Weeks    Status New      PT LONG TERM GOAL #4   Title Pt will improve gait velocity to at least 3 ft/sec for improved gait efficiency for community ambulation.    Time 9    Period Weeks    Status New      PT LONG TERM GOAL #5   Title Pt will ambulate at least 1000 ft, outdoor and indoor surfaces, appropriate device as needed, modified independently, for improved community gait.    Time 9    Period Weeks    Status New      Additional Long Term Goals   Additional Long Term Goals Yes      PT LONG TERM GOAL #6   Title FOTO score to improve by at least 10 % for improved overall reported functional mobility.    Time 9    Period Weeks    Status New                 Plan - 10/28/19 1913    Clinical Impression Statement Skilled aquatic session continues to focus on flexibility, strength, balance and gait deviations.  Pt progressing well and with improved ability to tolerate LUE ROM today.  Cont per poc.    Personal Factors and Comorbidities Comorbidity 3+    Comorbidities PMH:  diabetes, hyperlipidemia, smoking, breast cancer, R carotid stenosis requiring emergent thrombectomy, anxiety depression    Examination-Activity Limitations Locomotion Level;Transfers;Reach Overhead;Stand;Stairs;Lift;Dressing    Examination-Participation Restrictions Community Activity;Other   caring for grandchild   Stability/Clinical Decision Making Evolving/Moderate complexity    Rehab Potential Good    PT Frequency Other (comment)   1x/wk for 1 week, then 3x/wk for 8 weeks   PT Duration Other (comment)   9 weeks   PT Treatment/Interventions ADLs/Self Care Home Management;Electrical Stimulation;DME Instruction;Gait training;Stair training;Functional mobility  training;Therapeutic activities;Therapeutic exercise;Balance training;Neuromuscular re-education;Manual techniques;Orthotic Fit/Training;Patient/family education;Passive range of motion;Aquatic Therapy    PT Next Visit Plan gait training and SLS LLE weightbearing/trunk control activities; gait training, working towards [place] with Plan of Care Patient           Patient will benefit from skilled therapeutic intervention in order to improve the following deficits and impairments:  Abnormal gait, Decreased coordination, Difficulty walking, Impaired tone, Impaired UE functional use, Decreased activity tolerance, Decreased balance, Decreased mobility, Decreased strength, Postural dysfunction  Visit Diagnosis: Muscle weakness (generalized)  Other abnormalities of gait and mobility  Unsteadiness on feet     Problem List Patient Active Problem List   Diagnosis Date Noted  . Vascular headache   . Other chronic pain   . Uncontrolled type 2 diabetes mellitus with hyperglycemia ([place])   . Labile blood glucose   . Panic disorder with agoraphobia and moderate panic attacks   . Major depressive disorder, recurrent episode, moderate ([place])   . MCI (mild cognitive impairment) with memory loss   . Acute ischemic right middle cerebral artery (MCA) stroke ([place]) 03/09/2019  . Left hemiparesis ([place])   . Bradycardia   . Tachypnea   . SIRS (systemic inflammatory response syndrome) (HCC)   . Hypokalemia   . Diabetes mellitus type 2 in obese ([place])   . Tobacco abuse   . History of breast cancer   . Cognitive deficits   . Dysphagia, post-stroke   . Stroke (  cerebrum) ([place]) 03/02/2019  . Middle cerebral artery embolism, right 03/02/2019  . Chest pain 12/19/2015  . Numbness 12/19/2015  . Femoral hernia 03/24/2014  . Dyslipidemia 09/10/2011  . BRCA1 positive 09/10/2011  . Breast cancer ([place]) 09/10/2011  . H/O gastric bypass; 05/14/11([facility]) 09/10/2011  . Essential hypertension  10/28/2008  . DEPRESSION/ANXIETY 02/19/2007  . CARPAL TUNNEL SYNDROME, RIGHT 02/19/2007  . ALLERGIC RHINITIS 02/19/2007  . GERD 02/19/2007  . IRRITABLE BOWEL SYNDROME 02/19/2007  . HERPES GENITALIS 12/30/2006  . Diabetes ([place]) 12/30/2006  . OSTEOPENIA 12/30/2006  . ANOMALY, CONGENITAL, NERVOUS SYSTEM NEC 12/30/2006    Narda Bonds, PTA [place] 10/28/19 7:22 PM Phone: [phone] Fax: [place] [address] [place], [place], [address] Phone: [phone]   Fax:  [phone]  Name: ADELLYN CAPEK MRN: [id number] Date of Birth: 1970-09-01

## 2019-10-29 ENCOUNTER — Ambulatory Visit: Payer: Medicare Other | Admitting: Occupational Therapy

## 2019-10-29 ENCOUNTER — Encounter: Payer: Self-pay | Admitting: Occupational Therapy

## 2019-10-29 ENCOUNTER — Encounter: Payer: Self-pay | Admitting: Physical Therapy

## 2019-10-29 ENCOUNTER — Ambulatory Visit: Payer: Medicare Other | Admitting: Physical Therapy

## 2019-10-29 DIAGNOSIS — R278 Other lack of coordination: Secondary | ICD-10-CM

## 2019-10-29 DIAGNOSIS — R2689 Other abnormalities of gait and mobility: Secondary | ICD-10-CM | POA: Diagnosis not present

## 2019-10-29 DIAGNOSIS — M6281 Muscle weakness (generalized): Secondary | ICD-10-CM

## 2019-10-29 DIAGNOSIS — R293 Abnormal posture: Secondary | ICD-10-CM

## 2019-10-29 DIAGNOSIS — R29898 Other symptoms and signs involving the musculoskeletal system: Secondary | ICD-10-CM

## 2019-10-29 DIAGNOSIS — R29818 Other symptoms and signs involving the nervous system: Secondary | ICD-10-CM

## 2019-10-29 DIAGNOSIS — I69354 Hemiplegia and hemiparesis following cerebral infarction affecting left non-dominant side: Secondary | ICD-10-CM

## 2019-10-29 NOTE — Therapy (Signed)
[facility] [address] [place], [place], [address] Phone: [phone]   Fax:  [phone]  Physical Therapy Treatment  Patient Details  Name: Ann Lopez MRN: [id number] Date of Birth: 01-25-1971 Referring Provider (PT): Dr. Leeroy Cha   Encounter Date: 10/29/2019   PT End of Session - 10/29/19 1245    Visit Number 16    Number of Visits 26    Date for PT Re-Evaluation 12/23/19    Authorization Type UHC Medicare/Medicaid    Authorization Time Period 09/24/2019-12/23/2019    Progress Note Due on Visit 20    PT Start Time 1148    PT Stop Time 1232    PT Time Calculation (min) 44 min    Activity Tolerance Patient tolerated treatment well   No increase in shoulder pain; slight increase to 2/10 in back pain   Behavior During Therapy [facility] for tasks assessed/performed           Past Medical History:  Diagnosis Date  . Allergy   . Anxiety   . Asthma   . Back pain   . Breast cancer ([place])   . Cancer ([place])   . Depression   . Diabetes mellitus   . Headache   . Hyperlipemia   . Osteopenia   . Osteoporosis    osteopenia  . Stroke [facility])     Past Surgical History:  Procedure Laterality Date  . ABDOMINAL HYSTERECTOMY     B oophorectomy for BRCA1 gene  . BREAST SURGERY    . CESAREAN SECTION    . INCISION AND DRAINAGE ABSCESS Left 11/14/2012   Procedure: INCISION AND DRAINAGE ABSCESS LEFT GROIN;  Surgeon: Jamesetta So, MD;  Location: [facility];  Service: General;  Laterality: Left;  . IR ANGIO VERTEBRAL SEL SUBCLAVIAN INNOMINATE UNI R MOD SED  03/02/2019  . IR CT HEAD LTD  03/02/2019  . IR INTRAVSC STENT CERV CAROTID W/O EMB-PROT MOD SED INC ANGIO  03/02/2019  . IR PERCUTANEOUS ART THROMBECTOMY/INFUSION INTRACRANIAL INC DIAG ANGIO  03/02/2019  . LYMPHADENECTOMY    . MASTECTOMY Bilateral   . RADIOLOGY WITH ANESTHESIA N/A 03/02/2019   Procedure: IR WITH ANESTHESIA;  Surgeon: Luanne Bras, MD;  Location:  [facility];  Service: Radiology;  Laterality: N/A;    There were no vitals filed for this visit.   Subjective Assessment - 10/29/19 1152    Subjective "The only thing that was sore last night was my calves so we must have done something to work them." regarding aquatic session yesterday.    Pertinent History UXL:[id number], hyperlipidemia, smoking, breast cancer, R carotid stenosis requiring emergent thrombectomy    Patient Stated Goals Pt wants all the movement back in L side.    Currently in Pain? No/denies    Pain Onset --                             [facility] Adult PT Treatment/Exercise - 10/29/19 0001      Transfers   Transfers Sit to Stand    Sit to Stand 5: Supervision    Stand to Sit 5: Supervision      Ambulation/Gait   Ambulation/Gait Yes    Ambulation/Gait Assistance 5: Supervision    Ambulation/Gait Assistance Details Cues for improving upright posture to avoid L lateral lean with gait.  Trial of lift under L shoe with no significant improvement in lean.  Use walking poles with pt holding one  end and PTA other to assist with arm swing.  Gait did improve with this method.    Ambulation Distance (Feet) 400 Feet   x 1, 330 x 2 and around gym for activities   Assistive device None    Gait Pattern Step-through pattern;Decreased stance time - left;Decreased arm swing - left    Ambulation Surface Level;Indoor    Stairs Yes    Stairs Assistance 5: Supervision    Stair Management Technique One rail Right    Number of Stairs 8    Height of Stairs 6    Pre-Gait Activities worked on SLS in // bars for hip stability and to decreased L lateral lean      Posture/Postural Control   Posture/Postural Control Postural limitations    Postural Limitations Rounded Shoulders      Knee/Hip Exercises: Standing   Lateral Step Up Both;1 set;15 reps;Hand Hold: 1;Limitations;Step Height: 6"    Forward Step Up Right;Left;1 set;10 reps;Hand Hold: 1;Step Height: 6"    Step Down  Left;Hand Hold: 1;Step Height: 6"    Other Standing Knee Exercises Hip extension with red band tied to // bars x 15 on L, L knee terminal extension with red band x 15, L hip add with red band x 15      Knee/Hip Exercises: Supine   Hip Adduction Isometric Both;1 set;15 reps    Hip Adduction Isometric Limitations used pillow    Other Supine Knee/Hip Exercises Bridging, Bridge with hip abd/add, bridge with march all x 15 each.                      PT Short Term Goals - 10/21/19 0853      PT SHORT TERM GOAL #1   Title Pt will be independent with HEP for improved balance, strength, gait.  TARGET 5 weeks for all LTGs: 10/23/2019    Baseline 10/21/19: pt. verbalizes independence with HEP.    Time 5    Period Weeks    Status Achieved      PT SHORT TERM GOAL #2   Title Pt will improve 5x sit<>stand score to less than or equal to 12 seconds for improved functional strength.    Baseline 10/21/19: 12.31 seconds to complete 5x sit<>stand.    Time 5    Period Weeks    Status Partially Met      PT SHORT TERM GOAL #3   Title Pt will improve FGA score to at least 20/30 for decreased fall risk.    Baseline 10/21/19: pt. scored a 24/30 on the FGA today showing a decrease in fall risk.    Time 5    Period Weeks    Status Achieved      PT SHORT TERM GOAL #4   Title Pt will verbalize understanding of fall prevention in home environment.    Baseline 10/21/19: pt. verbalizes fall prevention techniques like using a rubber mat in the shower, and handrails when available.    Time 5    Period Weeks    Status Achieved             PT Long Term Goals - 09/24/19 1451      PT LONG TERM GOAL #1   Title Pt will be independent with progression of HEP for strength, balance, and gait.  TARGET 11/20/2019    Time 9    Period Weeks    Status New      PT LONG TERM GOAL #2  Title Pt will improve 5x sit<>stand score to less than or equal to 10 seconds for improved functional strength.    Time 9     Period Weeks    Status New      PT LONG TERM GOAL #3   Title Pt will improve Functional Gait Assessment score to at least 22/30 for decreased fall risk.    Time 9    Period Weeks    Status New      PT LONG TERM GOAL #4   Title Pt will improve gait velocity to at least 3 ft/sec for improved gait efficiency for community ambulation.    Time 9    Period Weeks    Status New      PT LONG TERM GOAL #5   Title Pt will ambulate at least 1000 ft, outdoor and indoor surfaces, appropriate device as needed, modified independently, for improved community gait.    Time 9    Period Weeks    Status New      Additional Long Term Goals   Additional Long Term Goals Yes      PT LONG TERM GOAL #6   Title FOTO score to improve by at least 10 % for improved overall reported functional mobility.    Time 9    Period Weeks    Status New                 Plan - 10/29/19 1246    Clinical Impression Statement Skilled session focused on strengthening and gait to address L lateral lean and R LE weakness.  Pt continues to need cues to avoid deviations but quickly reverts back.  cont per poc.    Personal Factors and Comorbidities Comorbidity 3+    Comorbidities PMH:  diabetes, hyperlipidemia, smoking, breast cancer, R carotid stenosis requiring emergent thrombectomy, anxiety depression    Examination-Activity Limitations Locomotion Level;Transfers;Reach Overhead;Stand;Stairs;Lift;Dressing    Examination-Participation Restrictions Community Activity;Other   caring for grandchild   Stability/Clinical Decision Making Evolving/Moderate complexity    Rehab Potential Good    PT Frequency Other (comment)   1x/wk for 1 week, then 3x/wk for 8 weeks   PT Duration Other (comment)   9 weeks   PT Treatment/Interventions ADLs/Self Care Home Management;Electrical Stimulation;DME Instruction;Gait training;Stair training;Functional mobility training;Therapeutic activities;Therapeutic exercise;Balance  training;Neuromuscular re-education;Manual techniques;Orthotic Fit/Training;Patient/family education;Passive range of motion;Aquatic Therapy    PT Next Visit Plan Amy-do we need to look at HEP again to address R hip strengthening?  gait training and SLS LLE weightbearing/trunk control activities; gait training, working towards [employer] and Agree with Plan of Care Patient           Patient will benefit from skilled therapeutic intervention in order to improve the following deficits and impairments:  Abnormal gait, Decreased coordination, Difficulty walking, Impaired tone, Impaired UE functional use, Decreased activity tolerance, Decreased balance, Decreased mobility, Decreased strength, Postural dysfunction  Visit Diagnosis: Hemiplegia and hemiparesis following cerebral infarction affecting left non-dominant side (HCC)  Other abnormalities of gait and mobility  Muscle weakness (generalized)     Problem List Patient Active Problem List   Diagnosis Date Noted  . Vascular headache   . Other chronic pain   . Uncontrolled type 2 diabetes mellitus with hyperglycemia ([place])   . Labile blood glucose   . Panic disorder with agoraphobia and moderate panic attacks   . Major depressive disorder, recurrent episode, moderate ([place])   . MCI (mild cognitive impairment) with memory loss   .  Acute ischemic right middle cerebral artery (MCA) stroke ([place]) 03/09/2019  . Left hemiparesis ([place])   . Bradycardia   . Tachypnea   . SIRS (systemic inflammatory response syndrome) (HCC)   . Hypokalemia   . Diabetes mellitus type 2 in obese ([place])   . Tobacco abuse   . History of breast cancer   . Cognitive deficits   . Dysphagia, post-stroke   . Stroke (cerebrum) ([place]) 03/02/2019  . Middle cerebral artery embolism, right 03/02/2019  . Chest pain 12/19/2015  . Numbness 12/19/2015  . Femoral hernia 03/24/2014  . Dyslipidemia 09/10/2011  . BRCA1 positive 09/10/2011  . Breast cancer ([place])  09/10/2011  . H/O gastric bypass; 05/14/11([facility]) 09/10/2011  . Essential hypertension 10/28/2008  . DEPRESSION/ANXIETY 02/19/2007  . CARPAL TUNNEL SYNDROME, RIGHT 02/19/2007  . ALLERGIC RHINITIS 02/19/2007  . GERD 02/19/2007  . IRRITABLE BOWEL SYNDROME 02/19/2007  . HERPES GENITALIS 12/30/2006  . Diabetes ([place]) 12/30/2006  . OSTEOPENIA 12/30/2006  . ANOMALY, CONGENITAL, NERVOUS SYSTEM NEC 12/30/2006    Narda Bonds, PTA [facility] 10/29/19 12:55 PM Phone: [phone] Fax: [facility] [address] [place], [place], [address] Phone: [phone]   Fax:  [phone]  Name: MELVINA PANGELINAN MRN: [id number] Date of Birth: 1970-06-14

## 2019-10-29 NOTE — Therapy (Signed)
[facility] [address] [place], [place], [address] Phone: [phone]   Fax:  [phone]  Occupational Therapy Treatment  Patient Details  Name: Ann Lopez MRN: [id number] Date of Birth: 03/16/1971 Referring Provider (OT): Dr. Leeroy Cha   Encounter Date: 10/29/2019   OT End of Session - 10/29/19 1234    Visit Number 5    Number of Visits 21    Date for OT Re-Evaluation 11/24/19    Authorization Type UHC Medicare/ Medicaid, covered 100% (medicare guidelines)    Authorization - Number of Visits 5    Progress Note Due on Visit 10    OT Start Time 1108    OT Stop Time 1148    OT Time Calculation (min) 40 min    Activity Tolerance Patient limited by pain    Behavior During Therapy [facility] for tasks assessed/performed           Past Medical History:  Diagnosis Date  . Allergy   . Anxiety   . Asthma   . Back pain   . Breast cancer ([place])   . Cancer ([place])   . Depression   . Diabetes mellitus   . Headache   . Hyperlipemia   . Osteopenia   . Osteoporosis    osteopenia  . Stroke [facility])     Past Surgical History:  Procedure Laterality Date  . ABDOMINAL HYSTERECTOMY     B oophorectomy for BRCA1 gene  . BREAST SURGERY    . CESAREAN SECTION    . INCISION AND DRAINAGE ABSCESS Left 11/14/2012   Procedure: INCISION AND DRAINAGE ABSCESS LEFT GROIN;  Surgeon: Jamesetta So, MD;  Location: [facility];  Service: General;  Laterality: Left;  . IR ANGIO VERTEBRAL SEL SUBCLAVIAN INNOMINATE UNI R MOD SED  03/02/2019  . IR CT HEAD LTD  03/02/2019  . IR INTRAVSC STENT CERV CAROTID W/O EMB-PROT MOD SED INC ANGIO  03/02/2019  . IR PERCUTANEOUS ART THROMBECTOMY/INFUSION INTRACRANIAL INC DIAG ANGIO  03/02/2019  . LYMPHADENECTOMY    . MASTECTOMY Bilateral   . RADIOLOGY WITH ANESTHESIA N/A 03/02/2019   Procedure: IR WITH ANESTHESIA;  Surgeon: Luanne Bras, MD;  Location: [facility];  Service: Radiology;  Laterality: N/A;      There were no vitals filed for this visit.   Subjective Assessment - 10/29/19 1228    Subjective  Pt reports that she only    Pertinent History R MCA  stroke with L shoulder pain.  PMH:  diabetes, anxiety, depression hyperlipidemia, smoking, breast cancer s/p bilateral mastectomy followed by chmo in 2009, R carotid stenosis requiring emergent thrombectomy    Patient Stated Goals improve L shoudler pain and ROM, be able to care for grandchild alone    Pain Onset More than a month ago             Ultrasound to lateral and anterior L shoulder x30mn 114m, 1.0wts/cm2 and 50% pulsed with no adverse reactions for pain/stiffness.   Kinesiotaped L shoulder (upper trap to relax) and biceps to relax due to pain reported.    Soft tissue and joint mobs at shoulder.  Followed by closed chain shoulder flexion with scapular retraction/depression in supine with foam noodle with min facilitation for scapula and shoulder positioning.    Open-chain shoulder flex with min cueing/facilitation for scapular movement and positioning for breathing and decr neck activation.          OT Short Term Goals - 10/29/19 1357  OT SHORT TERM GOAL #1   Title Pt will be independent with initial HEP.--check STGs 11/13/19 (extended due to scheduling)    Time 4    Period Weeks    Status New      OT SHORT TERM GOAL #2   Title Pt will demo at least 70* L shoulder flexion for functional reaching without pain.    Baseline 40*    Time 4    Period Weeks    Status Achieved   10/29/19:  approx 90*     OT SHORT TERM GOAL #3   Title Pt will verbalize understanding of proper positioning of LUE for decr pain.    Time 4    Period Weeks    Status On-going      OT SHORT TERM GOAL #4   Title Pt will be able to use LUE in simple light home maintenance tasks as non-dominant assist at least 50% of the time with pain less than 3/10 (wiping table, folding clothes, sweeping).    Time 4    Period Weeks    Status New       OT SHORT TERM GOAL #5   Title Pt will demo improved LUE functional reaching for ADLs as shown by improving score on box and blocks test by at least 6.    Baseline 28 blocks    Time 4    Period Weeks    Status New      OT SHORT TERM GOAL #6   Title Pt will verbalize understanding of adaptive strategies/AE to incr independence with cooking tasks.    Time 4    Period Weeks    Status New             OT Long Term Goals - 09/25/19 1820      OT LONG TERM GOAL #1   Title Pt will be independent with updated HEP for LUE--check LTGs 11/24/19    Time 8    Period Weeks    Status New      OT LONG TERM GOAL #2   Title Pt will demo at least 90* L shoulder flex without pain for functional reaching.    Baseline 40*    Time 8    Period Weeks    Status New      OT LONG TERM GOAL #3   Title Pt will be able to use LUE as nondominant assist for functional tasks at least 75% of the time for light ADLs and IADLs with pain consistently less than or equal to 2/10.    Time 8    Period Weeks    Status New      OT LONG TERM GOAL #4   Title Pt will improve L grip strength by at least 8lbs to assist in opening containers/home maintenance tasks.    Baseline 42.7lbs    Time 8    Period Weeks    Status New      OT LONG TERM GOAL #5   Title Pt will improve LUE coordination as shown by improving time on 9-hole peg test by at least 8sec.    Baseline 40.81sec    Time 8    Period Weeks    Status New      OT LONG TERM GOAL #6   Title Pt will improve LUE functional reaching for ADLs as shown by improving score on box and blocks test by at least 10.    Baseline 28    Time 8  Period Weeks    Status New                 Plan - 10/29/19 1200    Clinical Impression Statement Pt reports no pain execpt sometimes when she wakes up in the morning and with overhead movement.  However, pt's ROM and pain in L shoulder is significantly improved.  Pt continues to have compensation patterns affecting  L shoulder positioning and pain in higher ranges.    OT Occupational Profile and History Detailed Assessment- Review of Records and additional review of physical, cognitive, psychosocial history related to current functional performance    Occupational performance deficits (Please refer to evaluation for details): IADL's;ADL's;Leisure    Body Structure / Function / Physical Skills ADL;Balance;IADL;ROM;Strength;Tone;FMC;Coordination;UE functional use;Decreased knowledge of precautions;GMC;Decreased knowledge of use of DME;Pain    Rehab Potential Good    Clinical Decision Making Several treatment options, min-mod task modification necessary    Comorbidities Affecting Occupational Performance: May have comorbidities impacting occupational performance    Modification or Assistance to Complete Evaluation  Min-Moderate modification of tasks or assist with assess necessary to complete eval    OT Frequency --   eval + 3x week for 4 weeks followed by 2x week for 4 weeks or 21 visits total.   OT Treatment/Interventions Self-care/ADL training;Moist Heat;DME and/or AE instruction;Therapeutic activities;Aquatic Therapy;Therapeutic exercise;Ultrasound;Passive range of motion;Functional Mobility Training;Neuromuscular education;Cryotherapy;Electrical Stimulation;Manual Therapy;Patient/family education    Plan continue with proper positioning of LUE, manual therapy prn to address L shoulder pain and neuro re-ed (L shoulder/scapular/trunk), ?ultrasound and/or kinesiotaping    Consulted and Agree with Plan of Care Patient           Patient will benefit from skilled therapeutic intervention in order to improve the following deficits and impairments:   Body Structure / Function / Physical Skills: ADL, Balance, IADL, ROM, Strength, Tone, FMC, Coordination, UE functional use, Decreased knowledge of precautions, GMC, Decreased knowledge of use of DME, Pain       Visit Diagnosis: Hemiplegia and hemiparesis following  cerebral infarction affecting left non-dominant side (HCC)  Other lack of coordination  Abnormal posture  Other symptoms and signs involving the musculoskeletal system  Other symptoms and signs involving the nervous system  Muscle weakness (generalized)    Problem List Patient Active Problem List   Diagnosis Date Noted  . Vascular headache   . Other chronic pain   . Uncontrolled type 2 diabetes mellitus with hyperglycemia ([place])   . Labile blood glucose   . Panic disorder with agoraphobia and moderate panic attacks   . Major depressive disorder, recurrent episode, moderate ([place])   . MCI (mild cognitive impairment) with memory loss   . Acute ischemic right middle cerebral artery (MCA) stroke ([place]) 03/09/2019  . Left hemiparesis ([place])   . Bradycardia   . Tachypnea   . SIRS (systemic inflammatory response syndrome) (HCC)   . Hypokalemia   . Diabetes mellitus type 2 in obese ([place])   . Tobacco abuse   . History of breast cancer   . Cognitive deficits   . Dysphagia, post-stroke   . Stroke (cerebrum) ([place]) 03/02/2019  . Middle cerebral artery embolism, right 03/02/2019  . Chest pain 12/19/2015  . Numbness 12/19/2015  . Femoral hernia 03/24/2014  . Dyslipidemia 09/10/2011  . BRCA1 positive 09/10/2011  . Breast cancer ([place]) 09/10/2011  . H/O gastric bypass; 05/14/11([facility]) 09/10/2011  . Essential hypertension 10/28/2008  . DEPRESSION/ANXIETY 02/19/2007  . CARPAL TUNNEL SYNDROME, RIGHT 02/19/2007  . ALLERGIC RHINITIS  02/19/2007  . GERD 02/19/2007  . IRRITABLE BOWEL SYNDROME 02/19/2007  . HERPES GENITALIS 12/30/2006  . Diabetes ([employer]) 12/30/2006  . OSTEOPENIA 12/30/2006  . ANOMALY, CONGENITAL, NERVOUS SYSTEM NEC 12/30/2006    [facility] 10/29/2019, 2:06 PM  [facility] [address] [place], [place], [address] Phone: [phone]   Fax:  [phone]  Name: Ann Lopez MRN: [id number] Date of Birth:  May 29, 1970   Vianne Bulls, OTR/L [facility] [address] [place], [place]  [address] [phone] phone [phone] 10/29/19 2:06 PM

## 2019-10-30 ENCOUNTER — Other Ambulatory Visit: Payer: Self-pay

## 2019-10-30 ENCOUNTER — Ambulatory Visit: Payer: Medicare Other | Admitting: Physical Therapy

## 2019-10-30 DIAGNOSIS — R293 Abnormal posture: Secondary | ICD-10-CM

## 2019-10-30 DIAGNOSIS — R2689 Other abnormalities of gait and mobility: Secondary | ICD-10-CM | POA: Diagnosis not present

## 2019-10-30 DIAGNOSIS — R2681 Unsteadiness on feet: Secondary | ICD-10-CM

## 2019-10-30 DIAGNOSIS — M6281 Muscle weakness (generalized): Secondary | ICD-10-CM

## 2019-10-30 NOTE — Therapy (Signed)
[facility] [address] [place], [place], [address] Phone: [phone]   Fax:  [phone]  Physical Therapy Treatment  Patient Details  Name: Ann Lopez MRN: [id number] Date of Birth: 12-20-1970 Referring Provider (PT): Dr. Leeroy Cha   Encounter Date: 10/30/2019   PT End of Session - 10/30/19 1420    Visit Number 18    Number of Visits 26    Date for PT Re-Evaluation 12/23/19    Authorization Type [facility] Medicare/Medicaid    Authorization Time Period 09/24/2019-12/23/2019    Progress Note Due on Visit 20    PT Start Time 0852    PT Stop Time 0934    PT Time Calculation (min) 42 min    Activity Tolerance Patient tolerated treatment well    Behavior During Therapy [facility] for tasks assessed/performed           Past Medical History:  Diagnosis Date  . Allergy   . Anxiety   . Asthma   . Back pain   . Breast cancer ([place])   . Cancer ([place])   . Depression   . Diabetes mellitus   . Headache   . Hyperlipemia   . Osteopenia   . Osteoporosis    osteopenia  . Stroke [facility])     Past Surgical History:  Procedure Laterality Date  . ABDOMINAL HYSTERECTOMY     B oophorectomy for BRCA1 gene  . BREAST SURGERY    . CESAREAN SECTION    . INCISION AND DRAINAGE ABSCESS Left 11/14/2012   Procedure: INCISION AND DRAINAGE ABSCESS LEFT GROIN;  Surgeon: Jamesetta So, MD;  Location: [facility];  Service: General;  Laterality: Left;  . IR ANGIO VERTEBRAL SEL SUBCLAVIAN INNOMINATE UNI R MOD SED  03/02/2019  . IR CT HEAD LTD  03/02/2019  . IR INTRAVSC STENT CERV CAROTID W/O EMB-PROT MOD SED INC ANGIO  03/02/2019  . IR PERCUTANEOUS ART THROMBECTOMY/INFUSION INTRACRANIAL INC DIAG ANGIO  03/02/2019  . LYMPHADENECTOMY    . MASTECTOMY Bilateral   . RADIOLOGY WITH ANESTHESIA N/A 03/02/2019   Procedure: IR WITH ANESTHESIA;  Surgeon: Luanne Bras, MD;  Location: [facility];  Service: Radiology;  Laterality: N/A;    There were no  vitals filed for this visit.   Subjective Assessment - 10/30/19 0856    Subjective Overall the pain is getting a lot better.  Langley Gauss has really worked me.    Pertinent History IZT:[id number], hyperlipidemia, smoking, breast cancer, R carotid stenosis requiring emergent thrombectomy    Patient Stated Goals Pt wants all the movement back in L side.                             [place] Adult PT Treatment/Exercise - 10/30/19 0001      Ambulation/Gait   Ambulation/Gait Yes    Ambulation/Gait Assistance 5: Supervision    Ambulation/Gait Assistance Details Utilized bilateral walking poles, pt holding one end, PT hold other end to facilitate reciprocal arm swing, with improved gait pattern noted.    Ambulation Distance (Feet) 300 Feet   100   Assistive device None    Gait Pattern Step-through pattern;Decreased stance time - left;Decreased arm swing - left;Trendelenburg;Lateral trunk lean to left    Ambulation Surface Level;Indoor      Knee/Hip Exercises: Standing   Terminal Knee Extension Strengthening;Left;1 set;15 reps    Hip ADduction Strengthening;Right;Left;1 set;15 reps   red theraband   Hip Abduction Stengthening;Left;1 set;10  reps;Knee straight   red theraband   Hip Extension Stengthening;Left;1 set;15 reps;Knee straight   Red theraband   Lateral Step Up Both;1 set;15 reps;Hand Hold: 1;Limitations;Step Height: 6"    Lateral Step Up Limitations Cues for upright posture    Forward Step Up Right;Left;1 set;10 reps;Hand Hold: 1;Step Height: 6"    Forward Step Up Limitations Wearing foot-up brace; tactile cues for tall trunk on L side    Step Down Left;Hand Hold: 1;Step Height: 6"    Other Standing Knee Exercises REsisted sidstep R<>L, x 10 reps, with green theraband, 2 sets; resisted sidestepping along counter, R and L, x 3 laps with no UE support (pt has more upright trunk with less UE support)               Balance Exercises - 10/30/19 0001      Balance Exercises:  Standing   Tandem Stance Eyes open;3 reps;30 secs   Cues for glut trunk activation   SLS Eyes open;Solid surface;Upper extremity support 1;3 reps;10 secs   LLE, cues for upright trunk   Other Standing Exercises Comments Trunk rotation at wall, reaching across body to touch 10 reps each side, then at counter, lateral weigthshifting and rocking side to side; then rocking/reaching across body to target at cabinet (gentle LUE reach, no c/o pain)               PT Short Term Goals - 10/21/19 0853      PT SHORT TERM GOAL #1   Title Pt will be independent with HEP for improved balance, strength, gait.  TARGET 5 weeks for all LTGs: 10/23/2019    Baseline 10/21/19: pt. verbalizes independence with HEP.    Time 5    Period Weeks    Status Achieved      PT SHORT TERM GOAL #2   Title Pt will improve 5x sit<>stand score to less than or equal to 12 seconds for improved functional strength.    Baseline 10/21/19: 12.31 seconds to complete 5x sit<>stand.    Time 5    Period Weeks    Status Partially Met      PT SHORT TERM GOAL #3   Title Pt will improve FGA score to at least 20/30 for decreased fall risk.    Baseline 10/21/19: pt. scored a 24/30 on the FGA today showing a decrease in fall risk.    Time 5    Period Weeks    Status Achieved      PT SHORT TERM GOAL #4   Title Pt will verbalize understanding of fall prevention in home environment.    Baseline 10/21/19: pt. verbalizes fall prevention techniques like using a rubber mat in the shower, and handrails when available.    Time 5    Period Weeks    Status Achieved             PT Long Term Goals - 09/24/19 1451      PT LONG TERM GOAL #1   Title Pt will be independent with progression of HEP for strength, balance, and gait.  TARGET 11/20/2019    Time 9    Period Weeks    Status New      PT LONG TERM GOAL #2   Title Pt will improve 5x sit<>stand score to less than or equal to 10 seconds for improved functional strength.    Time 9      Period Weeks    Status New      PT  LONG TERM GOAL #3   Title Pt will improve Functional Gait Assessment score to at least 22/30 for decreased fall risk.    Time 9    Period Weeks    Status New      PT LONG TERM GOAL #4   Title Pt will improve gait velocity to at least 3 ft/sec for improved gait efficiency for community ambulation.    Time 9    Period Weeks    Status New      PT LONG TERM GOAL #5   Title Pt will ambulate at least 1000 ft, outdoor and indoor surfaces, appropriate device as needed, modified independently, for improved community gait.    Time 9    Period Weeks    Status New      Additional Long Term Goals   Additional Long Term Goals Yes      PT LONG TERM GOAL #6   Title FOTO score to improve by at least 10 % for improved overall reported functional mobility.    Time 9    Period Weeks    Status New                 Plan - 10/30/19 1421    Clinical Impression Statement Pt performs standing hip L hip strengthening exercises and NMR today with less UE support (with less UE support, pt actually has better posture and decreased lateral L trunk lean).  Overall, pt is performing standing exercises with better posture and technique (this is reason HEP in standing has not been upgraded); due to time constraints today, was unable to give handouts for HEP, so will formally update HEP next visit.    Personal Factors and Comorbidities Comorbidity 3+    Comorbidities PMH:  diabetes, hyperlipidemia, smoking, breast cancer, R carotid stenosis requiring emergent thrombectomy, anxiety depression    Examination-Activity Limitations Locomotion Level;Transfers;Reach Overhead;Stand;Stairs;Lift;Dressing    Examination-Participation Restrictions Community Activity;Other   caring for grandchild   Stability/Clinical Decision Making Evolving/Moderate complexity    Rehab Potential Good    PT Frequency Other (comment)   1x/wk for 1 week, then 3x/wk for 8 weeks   PT Duration Other  (comment)   9 weeks   PT Treatment/Interventions ADLs/Self Care Home Management;Electrical Stimulation;DME Instruction;Gait training;Stair training;Functional mobility training;Therapeutic activities;Therapeutic exercise;Balance training;Neuromuscular re-education;Manual techniques;Orthotic Fit/Training;Patient/family education;Passive range of motion;Aquatic Therapy    PT Next Visit Plan Update HEP-add resisted sidestepping at counter (she does better with R<>L sidestep x 10 reps), SLS and tandem stance, step ups, step downs with focus on tall trunk posture; gait with reciprocal arm swing    Consulted and Agree with Plan of Care Patient           Patient will benefit from skilled therapeutic intervention in order to improve the following deficits and impairments:  Abnormal gait, Decreased coordination, Difficulty walking, Impaired tone, Impaired UE functional use, Decreased activity tolerance, Decreased balance, Decreased mobility, Decreased strength, Postural dysfunction  Visit Diagnosis: Muscle weakness (generalized)  Other abnormalities of gait and mobility  Unsteadiness on feet  Abnormal posture     Problem List Patient Active Problem List   Diagnosis Date Noted  . Vascular headache   . Other chronic pain   . Uncontrolled type 2 diabetes mellitus with hyperglycemia ([place])   . Labile blood glucose   . Panic disorder with agoraphobia and moderate panic attacks   . Major depressive disorder, recurrent episode, moderate ([place])   . MCI (mild cognitive impairment) with memory loss   .  Acute ischemic right middle cerebral artery (MCA) stroke ([place]) 03/09/2019  . Left hemiparesis ([place])   . Bradycardia   . Tachypnea   . SIRS (systemic inflammatory response syndrome) ([facility])   . Hypokalemia   . Diabetes mellitus type 2 in obese ([place])   . Tobacco abuse   . History of breast cancer   . Cognitive deficits   . Dysphagia, post-stroke   . Stroke (cerebrum) ([place]) 03/02/2019  . Middle  cerebral artery embolism, right 03/02/2019  . Chest pain 12/19/2015  . Numbness 12/19/2015  . Femoral hernia 03/24/2014  . Dyslipidemia 09/10/2011  . BRCA1 positive 09/10/2011  . Breast cancer ([place]) 09/10/2011  . H/O gastric bypass; 05/14/11([facility]) 09/10/2011  . Essential hypertension 10/28/2008  . DEPRESSION/ANXIETY 02/19/2007  . CARPAL TUNNEL SYNDROME, RIGHT 02/19/2007  . ALLERGIC RHINITIS 02/19/2007  . GERD 02/19/2007  . IRRITABLE BOWEL SYNDROME 02/19/2007  . HERPES GENITALIS 12/30/2006  . Diabetes ([place]) 12/30/2006  . OSTEOPENIA 12/30/2006  . ANOMALY, CONGENITAL, NERVOUS SYSTEM NEC 12/30/2006    Carman Essick W. 10/30/2019, 2:25 PM  Frazier Butt., PT   [facility] [address] [place], [place], [address] Phone: [phone]   Fax:  [phone]  Name: Ann Lopez MRN: [id number] Date of Birth: [birth year]

## 2019-11-02 ENCOUNTER — Other Ambulatory Visit: Payer: Self-pay

## 2019-11-02 ENCOUNTER — Ambulatory Visit: Payer: Medicare Other | Admitting: Occupational Therapy

## 2019-11-02 ENCOUNTER — Encounter: Payer: Self-pay | Admitting: Physical Therapy

## 2019-11-02 ENCOUNTER — Ambulatory Visit: Payer: Medicare Other | Admitting: Physical Therapy

## 2019-11-02 ENCOUNTER — Encounter: Payer: Self-pay | Admitting: Occupational Therapy

## 2019-11-02 DIAGNOSIS — R2689 Other abnormalities of gait and mobility: Secondary | ICD-10-CM

## 2019-11-02 DIAGNOSIS — M25512 Pain in left shoulder: Secondary | ICD-10-CM

## 2019-11-02 DIAGNOSIS — R2681 Unsteadiness on feet: Secondary | ICD-10-CM

## 2019-11-02 DIAGNOSIS — R29898 Other symptoms and signs involving the musculoskeletal system: Secondary | ICD-10-CM

## 2019-11-02 DIAGNOSIS — R293 Abnormal posture: Secondary | ICD-10-CM

## 2019-11-02 DIAGNOSIS — M6281 Muscle weakness (generalized): Secondary | ICD-10-CM

## 2019-11-02 DIAGNOSIS — I69354 Hemiplegia and hemiparesis following cerebral infarction affecting left non-dominant side: Secondary | ICD-10-CM

## 2019-11-02 DIAGNOSIS — R278 Other lack of coordination: Secondary | ICD-10-CM

## 2019-11-02 DIAGNOSIS — R29818 Other symptoms and signs involving the nervous system: Secondary | ICD-10-CM

## 2019-11-02 NOTE — Therapy (Signed)
[facility] [address] [place], [place], [address] Phone: [phone]   Fax:  [phone]  Occupational Therapy Treatment  Patient Details  Name: Ann Lopez MRN: [id number] Date of Birth: [birth year] Referring Provider (OT): Dr. Leeroy Cha   Encounter Date: 11/02/2019   OT End of Session - 11/02/19 1155    Visit Number 6    Number of Visits 21    Date for OT Re-Evaluation 11/24/19    Authorization Type [employer] Medicare/ Medicaid, covered 100% (medicare guidelines)    Authorization - Number of Visits 6    Progress Note Due on Visit 10    OT Start Time 1106    OT Stop Time 1145    OT Time Calculation (min) 39 min    Activity Tolerance Patient limited by pain    Behavior During Therapy [facility] for tasks assessed/performed           Past Medical History:  Diagnosis Date  . Allergy   . Anxiety   . Asthma   . Back pain   . Breast cancer ([place])   . Cancer ([place])   . Depression   . Diabetes mellitus   . Headache   . Hyperlipemia   . Osteopenia   . Osteoporosis    osteopenia  . Stroke [facility])     Past Surgical History:  Procedure Laterality Date  . ABDOMINAL HYSTERECTOMY     B oophorectomy for BRCA1 gene  . BREAST SURGERY    . CESAREAN SECTION    . INCISION AND DRAINAGE ABSCESS Left 11/14/2012   Procedure: INCISION AND DRAINAGE ABSCESS LEFT GROIN;  Surgeon: Jamesetta So, MD;  Location: [facility];  Service: General;  Laterality: Left;  . IR ANGIO VERTEBRAL SEL SUBCLAVIAN INNOMINATE UNI R MOD SED  03/02/2019  . IR CT HEAD LTD  03/02/2019  . IR INTRAVSC STENT CERV CAROTID W/O EMB-PROT MOD SED INC ANGIO  03/02/2019  . IR PERCUTANEOUS ART THROMBECTOMY/INFUSION INTRACRANIAL INC DIAG ANGIO  03/02/2019  . LYMPHADENECTOMY    . MASTECTOMY Bilateral   . RADIOLOGY WITH ANESTHESIA N/A 03/02/2019   Procedure: IR WITH ANESTHESIA;  Surgeon: Luanne Bras, MD;  Location: [facility];  Service: Radiology;  Laterality: N/A;      There were no vitals filed for this visit.   Subjective Assessment - 11/02/19 1108    Subjective  no pain with most functional use of LUE    Pertinent History R MCA  stroke with L shoulder pain.  PMH:  diabetes, anxiety, depression hyperlipidemia, smoking, breast cancer s/p bilateral mastectomy followed by chmo in 2009, R carotid stenosis requiring emergent thrombectomy    Patient Stated Goals improve L shoudler pain and ROM, be able to care for grandchild alone    Currently in Pain? No/denies    Pain Onset More than a month ago               Ultrasound to lateral and anterior L shoulder x52mn 113m, 1.0wts/cm2 and 50% pulsed with no adverse reactions for pain/stiffness.   Kinesiotaped L shoulder (upper trap to relax) and correction piece for decr pain/improved positioning.    Sitting, closed chain shoulder flexion, abduction with scapular retraction/depression in with foam noodle with min-mod facilitation for scapula mobility and shoulder positioning.  Standing, shoulder ext closed-chain with ER/scapular depression with min cueing (pt reports pain at home, but with cueing for positioning, pt reports no pain).  Closed-chain shoulder flex with min cueing/facilitation  for scapular movement  and positioning with UE ranger and then with BUEs with ball along wall.  Seated, light wt. Bearing through LUE with hand on mat with lateral wt. Shift and body on arm movements with min facilitation/cues.  Modified quadruped with forward/backward wt. Shifts for incr scapular stability and mobility, decr tone, and neuro re-ed with min cueing to stay in pain-free range and positioning.           OT Short Term Goals - 10/29/19 1357      OT SHORT TERM GOAL #1   Title Pt will be independent with initial HEP.--check STGs 11/13/19 (extended due to scheduling)    Time 4    Period Weeks    Status New      OT SHORT TERM GOAL #2   Title Pt will demo at least 70* L shoulder flexion for functional  reaching without pain.    Baseline 40*    Time 4    Period Weeks    Status Achieved   10/29/19:  approx 90*     OT SHORT TERM GOAL #3   Title Pt will verbalize understanding of proper positioning of LUE for decr pain.    Time 4    Period Weeks    Status On-going      OT SHORT TERM GOAL #4   Title Pt will be able to use LUE in simple light home maintenance tasks as non-dominant assist at least 50% of the time with pain less than 3/10 (wiping table, folding clothes, sweeping).    Time 4    Period Weeks    Status New      OT SHORT TERM GOAL #5   Title Pt will demo improved LUE functional reaching for ADLs as shown by improving score on box and blocks test by at least 6.    Baseline 28 blocks    Time 4    Period Weeks    Status New      OT SHORT TERM GOAL #6   Title Pt will verbalize understanding of adaptive strategies/AE to incr independence with cooking tasks.    Time 4    Period Weeks    Status New             OT Long Term Goals - 09/25/19 1820      OT LONG TERM GOAL #1   Title Pt will be independent with updated HEP for LUE--check LTGs 11/24/19    Time 8    Period Weeks    Status New      OT LONG TERM GOAL #2   Title Pt will demo at least 90* L shoulder flex without pain for functional reaching.    Baseline 40*    Time 8    Period Weeks    Status New      OT LONG TERM GOAL #3   Title Pt will be able to use LUE as nondominant assist for functional tasks at least 75% of the time for light ADLs and IADLs with pain consistently less than or equal to 2/10.    Time 8    Period Weeks    Status New      OT LONG TERM GOAL #4   Title Pt will improve L grip strength by at least 8lbs to assist in opening containers/home maintenance tasks.    Baseline 42.7lbs    Time 8    Period Weeks    Status New      OT LONG TERM GOAL #5  Title Pt will improve LUE coordination as shown by improving time on 9-hole peg test by at least 8sec.    Baseline 40.81sec    Time 8     Period Weeks    Status New      OT LONG TERM GOAL #6   Title Pt will improve LUE functional reaching for ADLs as shown by improving score on box and blocks test by at least 10.    Baseline 28    Time 8    Period Weeks    Status New                 Plan - 11/02/19 1156    Clinical Impression Statement Pt continues to progress with improving pain, activity tolerance, and ROM.    OT Occupational Profile and History Detailed Assessment- Review of Records and additional review of physical, cognitive, psychosocial history related to current functional performance    Occupational performance deficits (Please refer to evaluation for details): IADL's;ADL's;Leisure    Body Structure / Function / Physical Skills ADL;Balance;IADL;ROM;Strength;Tone;FMC;Coordination;UE functional use;Decreased knowledge of precautions;GMC;Decreased knowledge of use of DME;Pain    Rehab Potential Good    Clinical Decision Making Several treatment options, min-mod task modification necessary    Comorbidities Affecting Occupational Performance: May have comorbidities impacting occupational performance    Modification or Assistance to Complete Evaluation  Min-Moderate modification of tasks or assist with assess necessary to complete eval    OT Frequency --   eval + 3x week for 4 weeks followed by 2x week for 4 weeks or 21 visits total.   OT Treatment/Interventions Self-care/ADL training;Moist Heat;DME and/or AE instruction;Therapeutic activities;Aquatic Therapy;Therapeutic exercise;Ultrasound;Passive range of motion;Functional Mobility Training;Neuromuscular education;Cryotherapy;Electrical Stimulation;Manual Therapy;Patient/family education    Plan continue with proper positioning of LUE, manual therapy prn to address L shoulder pain and neuro re-ed (L shoulder/scapular/trunk), ?ultrasound and/or kinesiotaping prn    Consulted and Agree with Plan of Care Patient           Patient will benefit from skilled  therapeutic intervention in order to improve the following deficits and impairments:   Body Structure / Function / Physical Skills: ADL, Balance, IADL, ROM, Strength, Tone, FMC, Coordination, UE functional use, Decreased knowledge of precautions, GMC, Decreased knowledge of use of DME, Pain       Visit Diagnosis: Hemiplegia and hemiparesis following cerebral infarction affecting left non-dominant side (HCC)  Other symptoms and signs involving the musculoskeletal system  Other symptoms and signs involving the nervous system  Acute pain of left shoulder  Abnormal posture  Other lack of coordination    Problem List Patient Active Problem List   Diagnosis Date Noted  . Vascular headache   . Other chronic pain   . Uncontrolled type 2 diabetes mellitus with hyperglycemia ([place])   . Labile blood glucose   . Panic disorder with agoraphobia and moderate panic attacks   . Major depressive disorder, recurrent episode, moderate ([place])   . MCI (mild cognitive impairment) with memory loss   . Acute ischemic right middle cerebral artery (MCA) stroke ([place]) 03/09/2019  . Left hemiparesis ([place])   . Bradycardia   . Tachypnea   . SIRS (systemic inflammatory response syndrome) ([facility])   . Hypokalemia   . Diabetes mellitus type 2 in obese ([place])   . Tobacco abuse   . History of breast cancer   . Cognitive deficits   . Dysphagia, post-stroke   . Stroke (cerebrum) ([place]) 03/02/2019  . Middle cerebral artery embolism, right 03/02/2019  .  Chest pain 12/19/2015  . Numbness 12/19/2015  . Femoral hernia 03/24/2014  . Dyslipidemia 09/10/2011  . BRCA1 positive 09/10/2011  . Breast cancer ([place]) 09/10/2011  . H/O gastric bypass; 05/14/11([facility]) 09/10/2011  . Essential hypertension 10/28/2008  . DEPRESSION/ANXIETY 02/19/2007  . CARPAL TUNNEL SYNDROME, RIGHT 02/19/2007  . ALLERGIC RHINITIS 02/19/2007  . GERD 02/19/2007  . IRRITABLE BOWEL SYNDROME 02/19/2007  . HERPES GENITALIS 12/30/2006  . Diabetes  ([place]) 12/30/2006  . OSTEOPENIA 12/30/2006  . ANOMALY, CONGENITAL, NERVOUS SYSTEM NEC 12/30/2006    [facility] 11/02/2019, 12:09 PM  [facility] [address] [place], [place], [address] Phone: [phone]   Fax:  [phone]  Name: Ann Lopez MRN: [id number] Date of Birth: 1970-07-01   Vianne Bulls, OTR/L [facility] [address] [place], [place]  [address] [phone] phone [phone] 11/02/19 12:09 PM

## 2019-11-02 NOTE — Therapy (Signed)
[facility] [address] [place], [place], [address] Phone: [phone]   Fax:  [phone]  Physical Therapy Treatment  Patient Details  Name: Ann Lopez MRN: [id number] Date of Birth: 04-14-1970 Referring Provider (PT): Dr. Leeroy Cha   Encounter Date: 11/02/2019   PT End of Session - 11/02/19 1811    Visit Number 19    Number of Visits 26    Date for PT Re-Evaluation 12/23/19    Authorization Type UHC Medicare/Medicaid    Authorization Time Period 09/24/2019-12/23/2019    Progress Note Due on Visit 20    PT Start Time 1345   pt arrived late due to traffic   PT Stop Time 1415    PT Time Calculation (min) 30 min    Equipment Utilized During Treatment Other (comment)   ankle cuffs, arm barbells   Activity Tolerance Patient tolerated treatment well    Behavior During Therapy [facility] for tasks assessed/performed           Past Medical History:  Diagnosis Date  . Allergy   . Anxiety   . Asthma   . Back pain   . Breast cancer ([place])   . Cancer ([place])   . Depression   . Diabetes mellitus   . Headache   . Hyperlipemia   . Osteopenia   . Osteoporosis    osteopenia  . Stroke [facility])     Past Surgical History:  Procedure Laterality Date  . ABDOMINAL HYSTERECTOMY     B oophorectomy for BRCA1 gene  . BREAST SURGERY    . CESAREAN SECTION    . INCISION AND DRAINAGE ABSCESS Left 11/14/2012   Procedure: INCISION AND DRAINAGE ABSCESS LEFT GROIN;  Surgeon: Jamesetta So, MD;  Location: [facility];  Service: General;  Laterality: Left;  . IR ANGIO VERTEBRAL SEL SUBCLAVIAN INNOMINATE UNI R MOD SED  03/02/2019  . IR CT HEAD LTD  03/02/2019  . IR INTRAVSC STENT CERV CAROTID W/O EMB-PROT MOD SED INC ANGIO  03/02/2019  . IR PERCUTANEOUS ART THROMBECTOMY/INFUSION INTRACRANIAL INC DIAG ANGIO  03/02/2019  . LYMPHADENECTOMY    . MASTECTOMY Bilateral   . RADIOLOGY WITH ANESTHESIA N/A 03/02/2019   Procedure: IR WITH  ANESTHESIA;  Surgeon: Luanne Bras, MD;  Location: [facility];  Service: Radiology;  Laterality: N/A;    There were no vitals filed for this visit.   Subjective Assessment - 11/02/19 1810    Subjective Denies any falls or changes.  States that her shoulder is getting better.    Pertinent History GXQ:JJHERDEY, hyperlipidemia, smoking, breast cancer, R carotid stenosis requiring emergent thrombectomy    Patient Stated Goals Pt wants all the movement back in L side.    Currently in Pain? No/denies           Aquatic therapy at [facility] - pool temp 87.4 degrees  Patient seen for aquatic therapy today. Treatment took place in water 4 feet deep depending upon activity. Pt entered and exited the pool viasteps with use ofright hand rail withSBA.  Pt performed hamstring stretch (runner's stretch) for each leg - 1 rep each - 30 sec hold  Pt gait trained forward 32macross pool2reps, backwards amb 268m 2,sideways amb.2518m2Reps.  Side step squat x 69m42m.  Pt needing cues and breaking side step squat into steps to encourage equal weight bearing bil LE's.  Tends to lean trunk and weight to R side.  Forwards gait 69m 42mworking on  arm swing/opposite leg then backwards for same.  Squats x 15 reps with1UE support on pool wall: Unilateral squats on each leg - 15reps RLE, 15 reps LLE with1UE support for closed chain strengthening   Standing with 1 UE support of pool edgewith L ankle buoyancy cuff for Lhip flexion, extension, hip flexion pushing straight into hip extension, hip abd, hip add with pool wall as target, SLR all x 10 reps.   Pt requires cues with all activities for equal WB thru LE's along with decreasing R lateral trunk lean and to relax R shoulder.  Pt requires the buoyancy of water for active assisted exercises with buoyancy supported for strengthening & ROM exercises: pt requires the viscosity of the water for resistance with strengthening exercises Gait training can  be performed in water with minimal fall risk in the water  Water current provides perturbations which challengebalance with activities.     PT Short Term Goals - 10/21/19 0853      PT SHORT TERM GOAL #1   Title Pt will be independent with HEP for improved balance, strength, gait.  TARGET 5 weeks for all LTGs: 10/23/2019    Baseline 10/21/19: pt. verbalizes independence with HEP.    Time 5    Period Weeks    Status Achieved      PT SHORT TERM GOAL #2   Title Pt will improve 5x sit<>stand score to less than or equal to 12 seconds for improved functional strength.    Baseline 10/21/19: 12.31 seconds to complete 5x sit<>stand.    Time 5    Period Weeks    Status Partially Met      PT SHORT TERM GOAL #3   Title Pt will improve FGA score to at least 20/30 for decreased fall risk.    Baseline 10/21/19: pt. scored a 24/30 on the FGA today showing a decrease in fall risk.    Time 5    Period Weeks    Status Achieved      PT SHORT TERM GOAL #4   Title Pt will verbalize understanding of fall prevention in home environment.    Baseline 10/21/19: pt. verbalizes fall prevention techniques like using a rubber mat in the shower, and handrails when available.    Time 5    Period Weeks    Status Achieved             PT Long Term Goals - 09/24/19 1451      PT LONG TERM GOAL #1   Title Pt will be independent with progression of HEP for strength, balance, and gait.  TARGET 11/20/2019    Time 9    Period Weeks    Status New      PT LONG TERM GOAL #2   Title Pt will improve 5x sit<>stand score to less than or equal to 10 seconds for improved functional strength.    Time 9    Period Weeks    Status New      PT LONG TERM GOAL #3   Title Pt will improve Functional Gait Assessment score to at least 22/30 for decreased fall risk.    Time 9    Period Weeks    Status New      PT LONG TERM GOAL #4   Title Pt will improve gait velocity to at least 3 ft/sec for improved gait efficiency for  community ambulation.    Time 9    Period Weeks    Status New  PT LONG TERM GOAL #5   Title Pt will ambulate at least 1000 ft, outdoor and indoor surfaces, appropriate device as needed, modified independently, for improved community gait.    Time 9    Period Weeks    Status New      Additional Long Term Goals   Additional Long Term Goals Yes      PT LONG TERM GOAL #6   Title FOTO score to improve by at least 10 % for improved overall reported functional mobility.    Time 9    Period Weeks    Status New                 Plan - 11/02/19 1812    Clinical Impression Statement Skilled aquatic session focused on gait, equal weight bearing in LE's with gait and activities and strengthening and balance.  Cont PT per POC.    Personal Factors and Comorbidities Comorbidity 3+    Comorbidities PMH:  diabetes, hyperlipidemia, smoking, breast cancer, R carotid stenosis requiring emergent thrombectomy, anxiety depression    Examination-Activity Limitations Locomotion Level;Transfers;Reach Overhead;Stand;Stairs;Lift;Dressing    Examination-Participation Restrictions Community Activity;Other   caring for grandchild   Stability/Clinical Decision Making Evolving/Moderate complexity    Rehab Potential Good    PT Frequency Other (comment)   1x/wk for 1 week, then 3x/wk for 8 weeks   PT Duration Other (comment)   9 weeks   PT Treatment/Interventions ADLs/Self Care Home Management;Electrical Stimulation;DME Instruction;Gait training;Stair training;Functional mobility training;Therapeutic activities;Therapeutic exercise;Balance training;Neuromuscular re-education;Manual techniques;Orthotic Fit/Training;Patient/family education;Passive range of motion;Aquatic Therapy    PT Next Visit Plan 20th visit PN due next session.  Update HEP-add resisted sidestepping at counter (she does better with R<>L sidestep x 10 reps), SLS and tandem stance, step ups, step downs with focus on tall trunk posture; gait  with reciprocal arm swing    Consulted and Agree with Plan of Care Patient           Patient will benefit from skilled therapeutic intervention in order to improve the following deficits and impairments:  Abnormal gait, Decreased coordination, Difficulty walking, Impaired tone, Impaired UE functional use, Decreased activity tolerance, Decreased balance, Decreased mobility, Decreased strength, Postural dysfunction  Visit Diagnosis: Muscle weakness (generalized)  Other abnormalities of gait and mobility  Unsteadiness on feet     Problem List Patient Active Problem List   Diagnosis Date Noted  . Vascular headache   . Other chronic pain   . Uncontrolled type 2 diabetes mellitus with hyperglycemia ([place])   . Labile blood glucose   . Panic disorder with agoraphobia and moderate panic attacks   . Major depressive disorder, recurrent episode, moderate ([place])   . MCI (mild cognitive impairment) with memory loss   . Acute ischemic right middle cerebral artery (MCA) stroke ([place]) 03/09/2019  . Left hemiparesis ([place])   . Bradycardia   . Tachypnea   . SIRS (systemic inflammatory response syndrome) ([facility])   . Hypokalemia   . Diabetes mellitus type 2 in obese ([place])   . Tobacco abuse   . History of breast cancer   . Cognitive deficits   . Dysphagia, post-stroke   . Stroke (cerebrum) ([place]) 03/02/2019  . Middle cerebral artery embolism, right 03/02/2019  . Chest pain 12/19/2015  . Numbness 12/19/2015  . Femoral hernia 03/24/2014  . Dyslipidemia 09/10/2011  . BRCA1 positive 09/10/2011  . Breast cancer ([place]) 09/10/2011  . H/O gastric bypass; 05/14/11([facility]) 09/10/2011  . Essential hypertension 10/28/2008  . DEPRESSION/ANXIETY 02/19/2007  .  CARPAL TUNNEL SYNDROME, RIGHT 02/19/2007  . ALLERGIC RHINITIS 02/19/2007  . GERD 02/19/2007  . IRRITABLE BOWEL SYNDROME 02/19/2007  . HERPES GENITALIS 12/30/2006  . Diabetes ([place]) 12/30/2006  . OSTEOPENIA 12/30/2006  . ANOMALY, CONGENITAL, NERVOUS  SYSTEM NEC 12/30/2006    Narda Bonds, PTA [facility] 11/02/19 6:18 PM Phone: [phone] Fax: [facility] [address] [place], [place], [address] Phone: [phone]   Fax:  [phone]  Name: Ann Lopez MRN: [id number] Date of Birth: November 29, 1970

## 2019-11-04 ENCOUNTER — Other Ambulatory Visit: Payer: Self-pay

## 2019-11-04 ENCOUNTER — Encounter: Payer: Self-pay | Admitting: Occupational Therapy

## 2019-11-04 ENCOUNTER — Ambulatory Visit: Payer: Medicare Other | Admitting: Occupational Therapy

## 2019-11-04 DIAGNOSIS — R29818 Other symptoms and signs involving the nervous system: Secondary | ICD-10-CM

## 2019-11-04 DIAGNOSIS — R2689 Other abnormalities of gait and mobility: Secondary | ICD-10-CM | POA: Diagnosis not present

## 2019-11-04 DIAGNOSIS — R29898 Other symptoms and signs involving the musculoskeletal system: Secondary | ICD-10-CM

## 2019-11-04 DIAGNOSIS — I69354 Hemiplegia and hemiparesis following cerebral infarction affecting left non-dominant side: Secondary | ICD-10-CM

## 2019-11-04 DIAGNOSIS — M6281 Muscle weakness (generalized): Secondary | ICD-10-CM

## 2019-11-04 DIAGNOSIS — M25512 Pain in left shoulder: Secondary | ICD-10-CM

## 2019-11-04 NOTE — Therapy (Signed)
[facility] [address] [place], [place], [address] Phone: [phone]   Fax:  [phone]  Occupational Therapy Treatment  Patient Details  Name: Ann Lopez MRN: [id number] Date of Birth: 23-Aug-1970 Referring Provider (OT): Dr. Leeroy Cha   Encounter Date: 11/04/2019   OT End of Session - 11/04/19 1301    Visit Number 7    Number of Visits 21    Date for OT Re-Evaluation 11/24/19    Authorization Type UHC Medicare/ Medicaid, covered 100% (medicare guidelines)    Authorization - Number of Visits 7    Progress Note Due on Visit 10    OT Start Time 1235    OT Stop Time 1315    OT Time Calculation (min) 40 min    Activity Tolerance Patient limited by pain    Behavior During Therapy [facility] for tasks assessed/performed           Past Medical History:  Diagnosis Date  . Allergy   . Anxiety   . Asthma   . Back pain   . Breast cancer ([place])   . Cancer ([place])   . Depression   . Diabetes mellitus   . Headache   . Hyperlipemia   . Osteopenia   . Osteoporosis    osteopenia  . Stroke [facility])     Past Surgical History:  Procedure Laterality Date  . ABDOMINAL HYSTERECTOMY     B oophorectomy for BRCA1 gene  . BREAST SURGERY    . CESAREAN SECTION    . INCISION AND DRAINAGE ABSCESS Left 11/14/2012   Procedure: INCISION AND DRAINAGE ABSCESS LEFT GROIN;  Surgeon: Jamesetta So, MD;  Location: [facility];  Service: General;  Laterality: Left;  . IR ANGIO VERTEBRAL SEL SUBCLAVIAN INNOMINATE UNI R MOD SED  03/02/2019  . IR CT HEAD LTD  03/02/2019  . IR INTRAVSC STENT CERV CAROTID W/O EMB-PROT MOD SED INC ANGIO  03/02/2019  . IR PERCUTANEOUS ART THROMBECTOMY/INFUSION INTRACRANIAL INC DIAG ANGIO  03/02/2019  . LYMPHADENECTOMY    . MASTECTOMY Bilateral   . RADIOLOGY WITH ANESTHESIA N/A 03/02/2019   Procedure: IR WITH ANESTHESIA;  Surgeon: Luanne Bras, MD;  Location: [facility];  Service: Radiology;  Laterality: N/A;      There were no vitals filed for this visit.   Subjective Assessment - 11/04/19 1543    Subjective  --    Pertinent History R MCA  stroke with L shoulder pain.  PMH:  diabetes, anxiety, depression hyperlipidemia, smoking, breast cancer s/p bilateral mastectomy followed by chmo in 2009, R carotid stenosis requiring emergent thrombectomy    Patient Stated Goals improve L shoudler pain and ROM, be able to care for grandchild alone    Currently in Pain? No/denies    Pain Onset More than a month ago                      Treatment:Ultrasound to lateral and anterior L shoulder x37mn 128m, 1.0wts/cm2 and 50% pulsed with no adverse reactions for pain/stiffness.   Kinesiotaped : Y- piece at shoulder and  L shoulder (upper trap to relax) ,and correction piece for decr pain/improved positioning.    Sitting, closed chain shoulder flexion, abduction with scapular retraction/depression in with foam noodle with min v.c for  shoulder positioning.   Closed-chain shoulder flex with min cueing/facilitation  for scapular movement and positioning with UE ranger   Seated, light wt. Bearing through LUE with hand on mat with lateral  wt. Shift and body on arm movements with min facilitation/cues.  Modified quadruped with forward/backward wt. Shifts for incr scapular stability and mobility, decr tone, and neuro re-ed with min cueing to stay in pain-free range and positioning.                 OT Short Term Goals - 10/29/19 1357      OT SHORT TERM GOAL #1   Title Pt will be independent with initial HEP.--check STGs 11/13/19 (extended due to scheduling)    Time 4    Period Weeks    Status New      OT SHORT TERM GOAL #2   Title Pt will demo at least 70* L shoulder flexion for functional reaching without pain.    Baseline 40*    Time 4    Period Weeks    Status Achieved   10/29/19:  approx 90*     OT SHORT TERM GOAL #3   Title Pt will verbalize understanding of proper positioning of  LUE for decr pain.    Time 4    Period Weeks    Status On-going      OT SHORT TERM GOAL #4   Title Pt will be able to use LUE in simple light home maintenance tasks as non-dominant assist at least 50% of the time with pain less than 3/10 (wiping table, folding clothes, sweeping).    Time 4    Period Weeks    Status New      OT SHORT TERM GOAL #5   Title Pt will demo improved LUE functional reaching for ADLs as shown by improving score on box and blocks test by at least 6.    Baseline 28 blocks    Time 4    Period Weeks    Status New      OT SHORT TERM GOAL #6   Title Pt will verbalize understanding of adaptive strategies/AE to incr independence with cooking tasks.    Time 4    Period Weeks    Status New             OT Long Term Goals - 09/25/19 1820      OT LONG TERM GOAL #1   Title Pt will be independent with updated HEP for LUE--check LTGs 11/24/19    Time 8    Period Weeks    Status New      OT LONG TERM GOAL #2   Title Pt will demo at least 90* L shoulder flex without pain for functional reaching.    Baseline 40*    Time 8    Period Weeks    Status New      OT LONG TERM GOAL #3   Title Pt will be able to use LUE as nondominant assist for functional tasks at least 75% of the time for light ADLs and IADLs with pain consistently less than or equal to 2/10.    Time 8    Period Weeks    Status New      OT LONG TERM GOAL #4   Title Pt will improve L grip strength by at least 8lbs to assist in opening containers/home maintenance tasks.    Baseline 42.7lbs    Time 8    Period Weeks    Status New      OT LONG TERM GOAL #5   Title Pt will improve LUE coordination as shown by improving time on 9-hole peg test by at least 8sec.  Baseline 40.81sec    Time 8    Period Weeks    Status New      OT LONG TERM GOAL #6   Title Pt will improve LUE functional reaching for ADLs as shown by improving score on box and blocks test by at least 10.    Baseline 28    Time 8     Period Weeks    Status New                 Plan - 11/04/19 1545    Clinical Impression Statement Pt is progressing towards goals with decreased overall pain.    OT Occupational Profile and History Detailed Assessment- Review of Records and additional review of physical, cognitive, psychosocial history related to current functional performance    Occupational performance deficits (Please refer to evaluation for details): IADL's;ADL's;Leisure    Body Structure / Function / Physical Skills ADL;Balance;IADL;ROM;Strength;Tone;FMC;Coordination;UE functional use;Decreased knowledge of precautions;GMC;Decreased knowledge of use of DME;Pain    Rehab Potential Good    Clinical Decision Making Several treatment options, min-mod task modification necessary    Comorbidities Affecting Occupational Performance: May have comorbidities impacting occupational performance    Modification or Assistance to Complete Evaluation  Min-Moderate modification of tasks or assist with assess necessary to complete eval    OT Frequency --   eval + 3x week for 4 weeks followed by 2x week for 4 weeks or 21 visits total.   OT Treatment/Interventions Self-care/ADL training;Moist Heat;DME and/or AE instruction;Therapeutic activities;Aquatic Therapy;Therapeutic exercise;Ultrasound;Passive range of motion;Functional Mobility Training;Neuromuscular education;Cryotherapy;Electrical Stimulation;Manual Therapy;Patient/family education    Plan continue with proper positioning of LUE, manual therapy prn to address L shoulder pain and neuro re-ed (L shoulder/scapular/trunk), ?ultrasound and/or kinesiotaping prn    Consulted and Agree with Plan of Care Patient           Patient will benefit from skilled therapeutic intervention in order to improve the following deficits and impairments:   Body Structure / Function / Physical Skills: ADL, Balance, IADL, ROM, Strength, Tone, FMC, Coordination, UE functional use, Decreased  knowledge of precautions, GMC, Decreased knowledge of use of DME, Pain       Visit Diagnosis: Muscle weakness (generalized)  Other abnormalities of gait and mobility  Other symptoms and signs involving the musculoskeletal system  Other symptoms and signs involving the nervous system  Hemiplegia and hemiparesis following cerebral infarction affecting left non-dominant side (HCC)  Acute pain of left shoulder    Problem List Patient Active Problem List   Diagnosis Date Noted  . Vascular headache   . Other chronic pain   . Uncontrolled type 2 diabetes mellitus with hyperglycemia ([place])   . Labile blood glucose   . Panic disorder with agoraphobia and moderate panic attacks   . Major depressive disorder, recurrent episode, moderate ([place])   . MCI (mild cognitive impairment) with memory loss   . Acute ischemic right middle cerebral artery (MCA) stroke ([place]) 03/09/2019  . Left hemiparesis ([place])   . Bradycardia   . Tachypnea   . SIRS (systemic inflammatory response syndrome) ([facility])   . Hypokalemia   . Diabetes mellitus type 2 in obese ([place])   . Tobacco abuse   . History of breast cancer   . Cognitive deficits   . Dysphagia, post-stroke   . Stroke (cerebrum) ([place]) 03/02/2019  . Middle cerebral artery embolism, right 03/02/2019  . Chest pain 12/19/2015  . Numbness 12/19/2015  . Femoral hernia 03/24/2014  . Dyslipidemia 09/10/2011  . BRCA1  positive 09/10/2011  . Breast cancer ([place]) 09/10/2011  . H/O gastric bypass; 05/14/11([facility]) 09/10/2011  . Essential hypertension 10/28/2008  . DEPRESSION/ANXIETY 02/19/2007  . CARPAL TUNNEL SYNDROME, RIGHT 02/19/2007  . ALLERGIC RHINITIS 02/19/2007  . GERD 02/19/2007  . IRRITABLE BOWEL SYNDROME 02/19/2007  . HERPES GENITALIS 12/30/2006  . Diabetes ([place]) 12/30/2006  . OSTEOPENIA 12/30/2006  . ANOMALY, CONGENITAL, NERVOUS SYSTEM NEC 12/30/2006    [facility] [place] 11/04/2019, 3:49 PM Theone Murdoch, OTR/L [phone] Phone: [phone] 3:52 PM 11/04/19 [facility] [address] [place], [place], [address] Phone: [phone]   Fax:  [phone]  Name: Ann Lopez MRN: [id number] Date of Birth: [birth year]

## 2019-11-05 ENCOUNTER — Ambulatory Visit: Payer: Medicare Other | Admitting: Occupational Therapy

## 2019-11-05 ENCOUNTER — Encounter: Payer: Self-pay | Admitting: Physical Therapy

## 2019-11-05 ENCOUNTER — Encounter: Payer: Self-pay | Admitting: Occupational Therapy

## 2019-11-05 ENCOUNTER — Ambulatory Visit: Payer: Medicare Other | Admitting: Physical Therapy

## 2019-11-05 DIAGNOSIS — R29818 Other symptoms and signs involving the nervous system: Secondary | ICD-10-CM

## 2019-11-05 DIAGNOSIS — R293 Abnormal posture: Secondary | ICD-10-CM

## 2019-11-05 DIAGNOSIS — R2689 Other abnormalities of gait and mobility: Secondary | ICD-10-CM

## 2019-11-05 DIAGNOSIS — R29898 Other symptoms and signs involving the musculoskeletal system: Secondary | ICD-10-CM

## 2019-11-05 DIAGNOSIS — R278 Other lack of coordination: Secondary | ICD-10-CM

## 2019-11-05 DIAGNOSIS — M6281 Muscle weakness (generalized): Secondary | ICD-10-CM

## 2019-11-05 DIAGNOSIS — R2681 Unsteadiness on feet: Secondary | ICD-10-CM

## 2019-11-05 DIAGNOSIS — I69354 Hemiplegia and hemiparesis following cerebral infarction affecting left non-dominant side: Secondary | ICD-10-CM

## 2019-11-05 DIAGNOSIS — M25512 Pain in left shoulder: Secondary | ICD-10-CM

## 2019-11-05 NOTE — Therapy (Signed)
[facility] [place] [address] [place], [place], [address] Phone: [phone]   Fax:  [phone]  Occupational Therapy Treatment  Patient Details  Name: Ann Lopez MRN: [id number] Date of Birth: 1970/09/26 Referring Provider (OT): Dr. Leeroy Cha   Encounter Date: 11/05/2019   OT End of Session - 11/05/19 1509    Visit Number 8    Number of Visits 25    Date for OT Re-Evaluation 11/24/19    Authorization Type [employer] Medicare/ Medicaid, covered 100% (medicare guidelines)    Authorization Time Period cert. date 09/25/19-12/24/19    Authorization - Number of Visits 8    Progress Note Due on Visit 10    OT Start Time 1106    OT Stop Time 1145    OT Time Calculation (min) 39 min    Activity Tolerance Patient limited by pain    Behavior During Therapy [facility] for tasks assessed/performed           Past Medical History:  Diagnosis Date  . Allergy   . Anxiety   . Asthma   . Back pain   . Breast cancer ([place])   . Cancer ([place])   . Depression   . Diabetes mellitus   . Headache   . Hyperlipemia   . Osteopenia   . Osteoporosis    osteopenia  . Stroke [facility])     Past Surgical History:  Procedure Laterality Date  . ABDOMINAL HYSTERECTOMY     B oophorectomy for BRCA1 gene  . BREAST SURGERY    . CESAREAN SECTION    . INCISION AND DRAINAGE ABSCESS Left 11/14/2012   Procedure: INCISION AND DRAINAGE ABSCESS LEFT GROIN;  Surgeon: Jamesetta So, MD;  Location: [facility];  Service: General;  Laterality: Left;  . IR ANGIO VERTEBRAL SEL SUBCLAVIAN INNOMINATE UNI R MOD SED  03/02/2019  . IR CT HEAD LTD  03/02/2019  . IR INTRAVSC STENT CERV CAROTID W/O EMB-PROT MOD SED INC ANGIO  03/02/2019  . IR PERCUTANEOUS ART THROMBECTOMY/INFUSION INTRACRANIAL INC DIAG ANGIO  03/02/2019  . LYMPHADENECTOMY    . MASTECTOMY Bilateral   . RADIOLOGY WITH ANESTHESIA N/A 03/02/2019   Procedure: IR WITH ANESTHESIA;  Surgeon: Luanne Bras, MD;   Location: [place];  Service: Radiology;  Laterality: N/A;    There were no vitals filed for this visit.   Subjective Assessment - 11/05/19 1103    Subjective  Pt reports that L shoulder is hurting today and hurt last night so she didn't sleep well.  she thinks that it may be due to taping from last visit (Wednesday)    Pertinent History R MCA  stroke with L shoulder pain.  PMH:  diabetes, anxiety, depression hyperlipidemia, smoking, breast cancer s/p bilateral mastectomy followed by chmo in 2009, R carotid stenosis requiring emergent thrombectomy    Patient Stated Goals improve L shoudler pain and ROM, be able to care for grandchild alone    Currently in Pain? Yes    Pain Score --   3-4/10   Pain Location Shoulder    Pain Orientation Left    Pain Descriptors / Indicators Aching    Pain Onset More than a month ago    Pain Frequency Intermittent    Aggravating Factors  malpositionging, certain movements    Pain Relieving Factors rest, ultrasound, stretching, proper positioning             Ultrasound to lateral and anterior L shoulder x30mn 161m, 1.0wts/cm2 and 50% pulsed  with no adverse reactions for pain/stiffness.   Supine, gentle joint mobs/soft tissue mobs to L shoulder and upper arm laterally due to tightness/pain.    Kinesiotaped L lateral upper arm (to relax at trigger point) and correction piece for decr pain/improved positioning.  Pt instructed to remove tape if incr pain.  Pt verbalized understanding.  Supine closed-chain shoulder flex with min facilitation for improved positioning in pain-free range.  Followed by seated closed chain shoulder flex without pain with min facilitation and open range shoulder flex with min cueing.      OT Short Term Goals - 11/05/19 1511      OT SHORT TERM GOAL #1   Title Pt will be independent with initial HEP.--check STGs 11/13/19 (extended due to scheduling)    Time 4    Period Weeks    Status New      OT SHORT TERM GOAL #2   Title Pt  will demo at least 70* L shoulder flexion for functional reaching without pain.    Baseline 40*    Time 4    Period Weeks    Status Achieved   10/29/19:  approx 90*     OT SHORT TERM GOAL #3   Title Pt will verbalize understanding of proper positioning of LUE for decr pain.    Time 4    Period Weeks    Status Achieved      OT SHORT TERM GOAL #4   Title Pt will be able to use LUE in simple light home maintenance tasks as non-dominant assist at least 50% of the time with pain less than 3/10 (wiping table, folding clothes, sweeping).    Time 4    Period Weeks    Status New      OT SHORT TERM GOAL #5   Title Pt will demo improved LUE functional reaching for ADLs as shown by improving score on box and blocks test by at least 6.    Baseline 28 blocks    Time 4    Period Weeks    Status New      OT SHORT TERM GOAL #6   Title Pt will verbalize understanding of adaptive strategies/AE to incr independence with cooking tasks.    Time 4    Period Weeks    Status New             OT Long Term Goals - 09/25/19 1820      OT LONG TERM GOAL #1   Title Pt will be independent with updated HEP for LUE--check LTGs 11/24/19    Time 8    Period Weeks    Status New      OT LONG TERM GOAL #2   Title Pt will demo at least 90* L shoulder flex without pain for functional reaching.    Baseline 40*    Time 8    Period Weeks    Status New      OT LONG TERM GOAL #3   Title Pt will be able to use LUE as nondominant assist for functional tasks at least 75% of the time for light ADLs and IADLs with pain consistently less than or equal to 2/10.    Time 8    Period Weeks    Status New      OT LONG TERM GOAL #4   Title Pt will improve L grip strength by at least 8lbs to assist in opening containers/home maintenance tasks.    Baseline 42.7lbs  Time 8    Period Weeks    Status New      OT LONG TERM GOAL #5   Title Pt will improve LUE coordination as shown by improving time on 9-hole peg test  by at least 8sec.    Baseline 40.81sec    Time 8    Period Weeks    Status New      OT LONG TERM GOAL #6   Title Pt will improve LUE functional reaching for ADLs as shown by improving score on box and blocks test by at least 10.    Baseline 28    Time 8    Period Weeks    Status New                 Plan - 11/05/19 1509    Clinical Impression Statement Pt arrived with pain initially today, but no pain at end of session and able to swing arm.  Pt is progressing towards goals with overall improved L shoulder pain and ROM.    OT Occupational Profile and History Detailed Assessment- Review of Records and additional review of physical, cognitive, psychosocial history related to current functional performance    Occupational performance deficits (Please refer to evaluation for details): IADL's;ADL's;Leisure    Body Structure / Function / Physical Skills ADL;Balance;IADL;ROM;Strength;Tone;FMC;Coordination;UE functional use;Decreased knowledge of precautions;GMC;Decreased knowledge of use of DME;Pain    Rehab Potential Good    Clinical Decision Making Several treatment options, min-mod task modification necessary    Comorbidities Affecting Occupational Performance: May have comorbidities impacting occupational performance    Modification or Assistance to Complete Evaluation  Min-Moderate modification of tasks or assist with assess necessary to complete eval    OT Frequency --   eval + 3x week for 8 weeks (modified due to scheduling)   OT Treatment/Interventions Self-care/ADL training;Moist Heat;DME and/or AE instruction;Therapeutic activities;Aquatic Therapy;Therapeutic exercise;Ultrasound;Passive range of motion;Functional Mobility Training;Neuromuscular education;Cryotherapy;Electrical Stimulation;Manual Therapy;Patient/family education    Plan continue with proper positioning of LUE, manual therapy prn to address L shoulder pain and neuro re-ed (L shoulder/scapular/trunk), ultrasound     Consulted and Agree with Plan of Care Patient           Patient will benefit from skilled therapeutic intervention in order to improve the following deficits and impairments:   Body Structure / Function / Physical Skills: ADL, Balance, IADL, ROM, Strength, Tone, FMC, Coordination, UE functional use, Decreased knowledge of precautions, GMC, Decreased knowledge of use of DME, Pain       Visit Diagnosis: Hemiplegia and hemiparesis following cerebral infarction affecting left non-dominant side (HCC)  Acute pain of left shoulder  Other symptoms and signs involving the nervous system  Other symptoms and signs involving the musculoskeletal system  Abnormal posture  Other lack of coordination    Problem List Patient Active Problem List   Diagnosis Date Noted  . Vascular headache   . Other chronic pain   . Uncontrolled type 2 diabetes mellitus with hyperglycemia ([place])   . Labile blood glucose   . Panic disorder with agoraphobia and moderate panic attacks   . Major depressive disorder, recurrent episode, moderate ([place])   . MCI (mild cognitive impairment) with memory loss   . Acute ischemic right middle cerebral artery (MCA) stroke ([place]) 03/09/2019  . Left hemiparesis ([place])   . Bradycardia   . Tachypnea   . SIRS (systemic inflammatory response syndrome) (HCC)   . Hypokalemia   . Diabetes mellitus type 2 in obese ([place])   .  Tobacco abuse   . History of breast cancer   . Cognitive deficits   . Dysphagia, post-stroke   . Stroke (cerebrum) ([place]) 03/02/2019  . Middle cerebral artery embolism, right 03/02/2019  . Chest pain 12/19/2015  . Numbness 12/19/2015  . Femoral hernia 03/24/2014  . Dyslipidemia 09/10/2011  . BRCA1 positive 09/10/2011  . Breast cancer ([place]) 09/10/2011  . H/O gastric bypass; 05/14/11([facility]) 09/10/2011  . Essential hypertension 10/28/2008  . DEPRESSION/ANXIETY 02/19/2007  . CARPAL TUNNEL SYNDROME, RIGHT 02/19/2007  . ALLERGIC RHINITIS 02/19/2007  . GERD  02/19/2007  . IRRITABLE BOWEL SYNDROME 02/19/2007  . HERPES GENITALIS 12/30/2006  . Diabetes ([place]) 12/30/2006  . OSTEOPENIA 12/30/2006  . ANOMALY, CONGENITAL, NERVOUS SYSTEM NEC 12/30/2006    [facility] 11/05/2019, 3:21 PM  [place] [address] [name], [place], [address] Phone: [phone]   Fax:  [phone]  Name: Ann Lopez MRN: [id number] Date of Birth: 05-13-1970   Vianne Bulls, OTR/L [facility] [address] [place], [place]  [address] [phone] phone [phone] 11/05/19 3:21 PM

## 2019-11-05 NOTE — Patient Instructions (Signed)
Access Code: [email or web address] URL: [email or web address] Date: 11/05/2019 Prepared by: Willow Ora  Exercises Supine Lower Trunk Rotation - 1 x daily - 7 x weekly - 1 sets - 3 reps - 30 sec hold Sit to stand in stride stance - 1 x daily - 7 x weekly - 1-2 sets - 10 reps Stride Stance Weight Shift - 1 x daily - 7 x weekly - 1-2 sets - 10 reps Side Stepping with Resistance at Thighs and Counter Support - 1 x daily - 5 x weekly - 1 sets - 3 reps Walking March - 1 x daily - 5 x weekly - 1 sets - 3 reps Tandem Walking with Counter Support - 1 x daily - 5 x weekly - 1 sets - 3 reps Standing Single Leg Stance with Counter Support - 1 x daily - 5 x weekly - 1 sets - 3 reps - 20 hold Step Up - 1 x daily - 5 x weekly - 1 sets - 10 reps

## 2019-11-05 NOTE — Therapy (Addendum)
[facility] [facility] [address] [place], [place], [address] Phone: [phone]   Fax:  [phone]  Physical Therapy Treatment/PROGRESS NOTE (19th visit)  Patient Details  Name: Ann Lopez MRN: [id number] Date of Birth: 04-09-1971 Referring Provider (PT): Dr. Leeroy Cha  PROGRESS NOTE:  Covering visits 11-19, 10/16/2019-11/05/2019 Pt has been completing aquatic therapy and land PT visits, and has initiated OT.  Pt has decreased LUE pain since beginning OT, and is able to coordinated LUE and trunk movements slightly better.  Objective measures:  FGA has improved from 20/30 to 24/30.  5x sit<>stand has improved to 12.31 seconds.  Pt has met 3 of 4 STGs.  She is progressing towards LTGs; she continues to demonstrate decreased timing and coordination of gait, decreased lower extremity and trunk strength, decreased balance.  She will continue to benefit from skilled PT for further work towards [place] for improved overall functional mobility and independence. Mady Haagensen, PT 11/06/19 9:22 AM Phone: [phone] Fax: [phone]     Encounter Date: 11/05/2019   PT End of Session - 11/05/19 1023    Visit Number 19 (visits were numbered incorrectly, 11/05/2019 visit is 74)   Number of Visits 26    Date for PT Re-Evaluation 12/23/19    Authorization Type [facility] Medicare/Medicaid    Authorization Time Period 09/24/2019-12/23/2019    Progress Note Due on Visit 29 (PN completed at visit 19, 11/05/2019)   PT Start Time 1018    PT Stop Time 1100    PT Time Calculation (min) 42 min    Equipment Utilized During Treatment Gait belt    Activity Tolerance Patient tolerated treatment well    Behavior During Therapy WFL for tasks assessed/performed           Past Medical History:  Diagnosis Date  . Allergy   . Anxiety   . Asthma   . Back pain   . Breast cancer ([place])   . Cancer ([place])   . Depression   . Diabetes mellitus   . Headache     . Hyperlipemia   . Osteopenia   . Osteoporosis    osteopenia  . Stroke [facility])     Past Surgical History:  Procedure Laterality Date  . ABDOMINAL HYSTERECTOMY     B oophorectomy for BRCA1 gene  . BREAST SURGERY    . CESAREAN SECTION    . INCISION AND DRAINAGE ABSCESS Left 11/14/2012   Procedure: INCISION AND DRAINAGE ABSCESS LEFT GROIN;  Surgeon: Jamesetta So, MD;  Location: [facility];  Service: General;  Laterality: Left;  . IR ANGIO VERTEBRAL SEL SUBCLAVIAN INNOMINATE UNI R MOD SED  03/02/2019  . IR CT HEAD LTD  03/02/2019  . IR INTRAVSC STENT CERV CAROTID W/O EMB-PROT MOD SED INC ANGIO  03/02/2019  . IR PERCUTANEOUS ART THROMBECTOMY/INFUSION INTRACRANIAL INC DIAG ANGIO  03/02/2019  . LYMPHADENECTOMY    . MASTECTOMY Bilateral   . RADIOLOGY WITH ANESTHESIA N/A 03/02/2019   Procedure: IR WITH ANESTHESIA;  Surgeon: Luanne Bras, MD;  Location: [facility];  Service: Radiology;  Laterality: N/A;    There were no vitals filed for this visit.   Subjective Assessment - 11/05/19 1021    Subjective No new compaints. No falls. Having left shoulder pain today.    Pertinent History ACZ:[id number], hyperlipidemia, smoking, breast cancer, R carotid stenosis requiring emergent thrombectomy    Patient Stated Goals Pt wants all the movement back in L side.    Currently in Pain? Yes  Pain Score 4     Pain Location Shoulder    Pain Orientation Left    Pain Descriptors / Indicators Aching    Pain Type Chronic pain    Pain Onset More than a month ago    Pain Frequency Intermittent    Aggravating Factors  malpositioning, certain movements    Pain Relieving Factors not moving it           Treatment: issued the following to HEP today (new ex's in bold). Reviewed current ex's listed below and kept in program due to still challenging.  Cues needed on ex form and technique with new ex's with up to min guard assist for safety with balance ex's.   Access Code: [email or web address] URL:  [email or web address] Date: 11/05/2019 Prepared by: Willow Ora  Exercises Supine Lower Trunk Rotation - 1 x daily - 7 x weekly - 1 sets - 3 reps - 30 sec hold Sit to stand in stride stance - 1 x daily - 7 x weekly - 1-2 sets - 10 reps Stride Stance Weight Shift - 1 x daily - 7 x weekly - 1-2 sets - 10 reps  Side Stepping with Resistance at Thighs and Counter Support - 1 x daily - 5 x weekly - 1 sets - 3 reps Walking March - 1 x daily - 5 x weekly - 1 sets - 3 reps Tandem Walking with Counter Support - 1 x daily - 5 x weekly - 1 sets - 3 reps Standing Single Leg Stance with Counter Support - 1 x daily - 5 x weekly - 1 sets - 3 reps - 20 hold Step Up - 1 x daily - 5 x weekly - 1 sets - 10 reps          PT Short Term Goals - 10/21/19 0853      PT SHORT TERM GOAL #1   Title Pt will be independent with HEP for improved balance, strength, gait.  TARGET 5 weeks for all LTGs: 10/23/2019    Baseline 10/21/19: pt. verbalizes independence with HEP.    Time 5    Period Weeks    Status Achieved      PT SHORT TERM GOAL #2   Title Pt will improve 5x sit<>stand score to less than or equal to 12 seconds for improved functional strength.    Baseline 10/21/19: 12.31 seconds to complete 5x sit<>stand.    Time 5    Period Weeks    Status Partially Met      PT SHORT TERM GOAL #3   Title Pt will improve FGA score to at least 20/30 for decreased fall risk.    Baseline 10/21/19: pt. scored a 24/30 on the FGA today showing a decrease in fall risk.    Time 5    Period Weeks    Status Achieved      PT SHORT TERM GOAL #4   Title Pt will verbalize understanding of fall prevention in home environment.    Baseline 10/21/19: pt. verbalizes fall prevention techniques like using a rubber mat in the shower, and handrails when available.    Time 5    Period Weeks    Status Achieved             PT Long Term Goals - 09/24/19 1451      PT LONG TERM GOAL #1   Title Pt will be independent  with progression of HEP for strength, balance, and gait.  TARGET 11/20/2019    Time  9    Period Weeks    Status New      PT LONG TERM GOAL #2   Title Pt will improve 5x sit<>stand score to less than or equal to 10 seconds for improved functional strength.    Time 9    Period Weeks    Status New      PT LONG TERM GOAL #3   Title Pt will improve Functional Gait Assessment score to at least 22/30 for decreased fall risk.    Time 9    Period Weeks    Status New      PT LONG TERM GOAL #4   Title Pt will improve gait velocity to at least 3 ft/sec for improved gait efficiency for community ambulation.    Time 9    Period Weeks    Status New      PT LONG TERM GOAL #5   Title Pt will ambulate at least 1000 ft, outdoor and indoor surfaces, appropriate device as needed, modified independently, for improved community gait.    Time 9    Period Weeks    Status New      Additional Long Term Goals   Additional Long Term Goals Yes      PT LONG TERM GOAL #6   Title FOTO score to improve by at least 10 % for improved overall reported functional mobility.    Time 9    Period Weeks    Status New                 Plan - 11/05/19 1023    Clinical Impression Statement Today's skilled session focused on advancment of HEP to include more standing strengthening/balance ex's. No issues noted or reported in session. The pt is progressing toward goals and should benefit from continued PT to progress toward unmet goals.    Personal Factors and Comorbidities Comorbidity 3+    Comorbidities PMH:  diabetes, hyperlipidemia, smoking, breast cancer, R carotid stenosis requiring emergent thrombectomy, anxiety depression    Examination-Activity Limitations Locomotion Level;Transfers;Reach Overhead;Stand;Stairs;Lift;Dressing    Examination-Participation Restrictions Community Activity;Other   caring for grandchild   Stability/Clinical Decision Making Evolving/Moderate complexity    Rehab Potential Good      PT Frequency Other (comment)   1x/wk for 1 week, then 3x/wk for 8 weeks   PT Duration Other (comment)   9 weeks   PT Treatment/Interventions ADLs/Self Care Home Management;Electrical Stimulation;DME Instruction;Gait training;Stair training;Functional mobility training;Therapeutic activities;Therapeutic exercise;Balance training;Neuromuscular re-education;Manual techniques;Orthotic Fit/Training;Patient/family education;Passive range of motion;Aquatic Therapy    PT Next Visit Plan gait with reciprocal arm swing; continue to work on balance reactions and left LE strengthening with emphasis on left LE weight bearing    PT Home Exercise Plan Access Code: [email or web address]    Consulted and Agree with Plan of Care Patient           Patient will benefit from skilled therapeutic intervention in order to improve the following deficits and impairments:  Abnormal gait, Decreased coordination, Difficulty walking, Impaired tone, Impaired UE functional use, Decreased activity tolerance, Decreased balance, Decreased mobility, Decreased strength, Postural dysfunction  Visit Diagnosis: Other abnormalities of gait and mobility  Unsteadiness on feet  Muscle weakness (generalized)  Abnormal posture     Problem List Patient Active Problem List   Diagnosis Date Noted  . Vascular headache   . Other chronic pain   . Uncontrolled type 2 diabetes mellitus with hyperglycemia ([place])   . Labile blood glucose   . Panic disorder  with agoraphobia and moderate panic attacks   . Major depressive disorder, recurrent episode, moderate ([place])   . MCI (mild cognitive impairment) with memory loss   . Acute ischemic right middle cerebral artery (MCA) stroke ([place]) 03/09/2019  . Left hemiparesis ([place])   . Bradycardia   . Tachypnea   . SIRS (systemic inflammatory response syndrome) (HCC)   . Hypokalemia   . Diabetes mellitus type 2 in obese ([place])   . Tobacco abuse   . History of breast cancer   . Cognitive deficits   .  Dysphagia, post-stroke   . Stroke (cerebrum) ([place]) 03/02/2019  . Middle cerebral artery embolism, right 03/02/2019  . Chest pain 12/19/2015  . Numbness 12/19/2015  . Femoral hernia 03/24/2014  . Dyslipidemia 09/10/2011  . BRCA1 positive 09/10/2011  . Breast cancer ([place]) 09/10/2011  . H/O gastric bypass; 05/14/11([facility]) 09/10/2011  . Essential hypertension 10/28/2008  . DEPRESSION/ANXIETY 02/19/2007  . CARPAL TUNNEL SYNDROME, RIGHT 02/19/2007  . ALLERGIC RHINITIS 02/19/2007  . GERD 02/19/2007  . IRRITABLE BOWEL SYNDROME 02/19/2007  . HERPES GENITALIS 12/30/2006  . Diabetes ([place]) 12/30/2006  . OSTEOPENIA 12/30/2006  . ANOMALY, CONGENITAL, NERVOUS SYSTEM NEC 12/30/2006    Willow Ora, PTA, [facility] [address], [place], [place] [address] [phone] 11/05/19, 11:08 PM   Name: Ann Lopez MRN: [id number] Date of Birth: Aug 19, 1970

## 2019-11-09 ENCOUNTER — Ambulatory Visit: Payer: Medicare Other

## 2019-11-09 ENCOUNTER — Other Ambulatory Visit: Payer: Self-pay

## 2019-11-09 ENCOUNTER — Ambulatory Visit: Payer: Medicare Other | Attending: Physical Medicine and Rehabilitation | Admitting: Occupational Therapy

## 2019-11-09 ENCOUNTER — Encounter: Payer: Self-pay | Admitting: Occupational Therapy

## 2019-11-09 DIAGNOSIS — M6281 Muscle weakness (generalized): Secondary | ICD-10-CM | POA: Insufficient documentation

## 2019-11-09 DIAGNOSIS — R2681 Unsteadiness on feet: Secondary | ICD-10-CM | POA: Diagnosis present

## 2019-11-09 DIAGNOSIS — R29818 Other symptoms and signs involving the nervous system: Secondary | ICD-10-CM | POA: Diagnosis present

## 2019-11-09 DIAGNOSIS — R278 Other lack of coordination: Secondary | ICD-10-CM | POA: Diagnosis present

## 2019-11-09 DIAGNOSIS — R293 Abnormal posture: Secondary | ICD-10-CM | POA: Diagnosis present

## 2019-11-09 DIAGNOSIS — I69354 Hemiplegia and hemiparesis following cerebral infarction affecting left non-dominant side: Secondary | ICD-10-CM | POA: Diagnosis not present

## 2019-11-09 DIAGNOSIS — R2689 Other abnormalities of gait and mobility: Secondary | ICD-10-CM

## 2019-11-09 DIAGNOSIS — R29898 Other symptoms and signs involving the musculoskeletal system: Secondary | ICD-10-CM | POA: Insufficient documentation

## 2019-11-09 DIAGNOSIS — M25512 Pain in left shoulder: Secondary | ICD-10-CM | POA: Diagnosis present

## 2019-11-09 NOTE — Therapy (Signed)
[facility] [address] [place], [place], [address] Phone: [phone]   Fax:  [phone]  Occupational Therapy Treatment  Patient Details  Name: Ann Lopez MRN: [id number] Date of Birth: Feb 06, 1971 Referring Provider (OT): Dr. Leeroy Cha   Encounter Date: 11/09/2019   OT End of Session - 11/09/19 1017    Visit Number 9    Number of Visits 25    Date for OT Re-Evaluation 11/24/19    Authorization Type UHC Medicare/ Medicaid, covered 100% (medicare guidelines)    Authorization Time Period cert. date 09/25/19-12/24/19    Authorization - Number of Visits 9    Progress Note Due on Visit 10    OT Start Time 1018    OT Stop Time 1100    OT Time Calculation (min) 42 min    Activity Tolerance Patient limited by pain    Behavior During Therapy [facility] for tasks assessed/performed           Past Medical History:  Diagnosis Date  . Allergy   . Anxiety   . Asthma   . Back pain   . Breast cancer ([place])   . Cancer ([place])   . Depression   . Diabetes mellitus   . Headache   . Hyperlipemia   . Osteopenia   . Osteoporosis    osteopenia  . Stroke [facility])     Past Surgical History:  Procedure Laterality Date  . ABDOMINAL HYSTERECTOMY     B oophorectomy for BRCA1 gene  . BREAST SURGERY    . CESAREAN SECTION    . INCISION AND DRAINAGE ABSCESS Left 11/14/2012   Procedure: INCISION AND DRAINAGE ABSCESS LEFT GROIN;  Surgeon: Jamesetta So, MD;  Location: [facility];  Service: General;  Laterality: Left;  . IR ANGIO VERTEBRAL SEL SUBCLAVIAN INNOMINATE UNI R MOD SED  03/02/2019  . IR CT HEAD LTD  03/02/2019  . IR INTRAVSC STENT CERV CAROTID W/O EMB-PROT MOD SED INC ANGIO  03/02/2019  . IR PERCUTANEOUS ART THROMBECTOMY/INFUSION INTRACRANIAL INC DIAG ANGIO  03/02/2019  . LYMPHADENECTOMY    . MASTECTOMY Bilateral   . RADIOLOGY WITH ANESTHESIA N/A 03/02/2019   Procedure: IR WITH ANESTHESIA;  Surgeon: Luanne Bras, MD;   Location: [facility];  Service: Radiology;  Laterality: N/A;    There were no vitals filed for this visit.   Subjective Assessment - 11/09/19 1016    Subjective  L shoulder was achy over weekend when reaching down or reaching but no sharp pain.  Pt reports achiness in biceps when reaching down    Pertinent History R MCA  stroke with L shoulder pain.  PMH:  diabetes, anxiety, depression hyperlipidemia, smoking, breast cancer s/p bilateral mastectomy followed by chmo in 2009, R carotid stenosis requiring emergent thrombectomy    Patient Stated Goals improve L shoudler pain and ROM, be able to care for grandchild alone    Currently in Pain? No/denies    Pain Onset More than a month ago                Ultrasound to biceps and deltoid area x51mn 112m, 1.0wts/cm2 and 50% pulsed with no adverse reactions for pain/stiffness.   Supine, gentle joint mobs/soft tissue mobs to L shoulder/scapula, upper traps, and biceps due to tightness/pain and trigger points.    Sidelying, AAROM shoulder flex with mod facilitation at scapula/shoulder for improved positioning.  Scapular retraction with mod facilitation in sidelying.  Supine closed-chain shoulder flex with min facilitation for  improved positioning in pain-free range.      OT Education - 11/09/19 1213    Education Details Use of heat on L shoulder for tightness and ice to biecps, particularly if sharp pain; Ok to use Biofreeze (but not at same time as heat/ice); Emphasize scapular retraction and depression    Person(s) Educated Patient    Methods Explanation    Comprehension Verbalized understanding            OT Short Term Goals - 11/05/19 1511      OT SHORT TERM GOAL #1   Title Pt will be independent with initial HEP.--check STGs 11/13/19 (extended due to scheduling)    Time 4    Period Weeks    Status New      OT SHORT TERM GOAL #2   Title Pt will demo at least 70* L shoulder flexion for functional reaching without pain.    Baseline  40*    Time 4    Period Weeks    Status Achieved   10/29/19:  approx 90*     OT SHORT TERM GOAL #3   Title Pt will verbalize understanding of proper positioning of LUE for decr pain.    Time 4    Period Weeks    Status Achieved      OT SHORT TERM GOAL #4   Title Pt will be able to use LUE in simple light home maintenance tasks as non-dominant assist at least 50% of the time with pain less than 3/10 (wiping table, folding clothes, sweeping).    Time 4    Period Weeks    Status New      OT SHORT TERM GOAL #5   Title Pt will demo improved LUE functional reaching for ADLs as shown by improving score on box and blocks test by at least 6.    Baseline 28 blocks    Time 4    Period Weeks    Status New      OT SHORT TERM GOAL #6   Title Pt will verbalize understanding of adaptive strategies/AE to incr independence with cooking tasks.    Time 4    Period Weeks    Status New             OT Long Term Goals - 09/25/19 1820      OT LONG TERM GOAL #1   Title Pt will be independent with updated HEP for LUE--check LTGs 11/24/19    Time 8    Period Weeks    Status New      OT LONG TERM GOAL #2   Title Pt will demo at least 90* L shoulder flex without pain for functional reaching.    Baseline 40*    Time 8    Period Weeks    Status New      OT LONG TERM GOAL #3   Title Pt will be able to use LUE as nondominant assist for functional tasks at least 75% of the time for light ADLs and IADLs with pain consistently less than or equal to 2/10.    Time 8    Period Weeks    Status New      OT LONG TERM GOAL #4   Title Pt will improve L grip strength by at least 8lbs to assist in opening containers/home maintenance tasks.    Baseline 42.7lbs    Time 8    Period Weeks    Status New  OT LONG TERM GOAL #5   Title Pt will improve LUE coordination as shown by improving time on 9-hole peg test by at least 8sec.    Baseline 40.81sec    Time 8    Period Weeks    Status New      OT  LONG TERM GOAL #6   Title Pt will improve LUE functional reaching for ADLs as shown by improving score on box and blocks test by at least 10.    Baseline 28    Time 8    Period Weeks    Status New                 Plan - 11/09/19 1018    OT Occupational Profile and History Detailed Assessment- Review of Records and additional review of physical, cognitive, psychosocial history related to current functional performance    Occupational performance deficits (Please refer to evaluation for details): IADL's;ADL's;Leisure    Body Structure / Function / Physical Skills ADL;Balance;IADL;ROM;Strength;Tone;FMC;Coordination;UE functional use;Decreased knowledge of precautions;GMC;Decreased knowledge of use of DME;Pain    Rehab Potential Good    Clinical Decision Making Several treatment options, min-mod task modification necessary    Comorbidities Affecting Occupational Performance: May have comorbidities impacting occupational performance    Modification or Assistance to Complete Evaluation  Min-Moderate modification of tasks or assist with assess necessary to complete eval    OT Frequency --   eval + 3x week for 8 weeks (modified due to scheduling)   OT Treatment/Interventions Self-care/ADL training;Moist Heat;DME and/or AE instruction;Therapeutic activities;Aquatic Therapy;Therapeutic exercise;Ultrasound;Passive range of motion;Functional Mobility Training;Neuromuscular education;Cryotherapy;Electrical Stimulation;Manual Therapy;Patient/family education    Plan check STGs and Progress Note next session.  Continue with proper positioning of LUE, manual therapy prn to address L shoulder pain and neuro re-ed (L shoulder/scapular/trunk), ultrasound    Consulted and Agree with Plan of Care Patient           Patient will benefit from skilled therapeutic intervention in order to improve the following deficits and impairments:   Body Structure / Function / Physical Skills: ADL, Balance, IADL, ROM,  Strength, Tone, FMC, Coordination, UE functional use, Decreased knowledge of precautions, GMC, Decreased knowledge of use of DME, Pain       Visit Diagnosis: Hemiplegia and hemiparesis following cerebral infarction affecting left non-dominant side (HCC)  Abnormal posture  Acute pain of left shoulder  Other symptoms and signs involving the nervous system  Other symptoms and signs involving the musculoskeletal system  Other lack of coordination    Problem List Patient Active Problem List   Diagnosis Date Noted  . Vascular headache   . Other chronic pain   . Uncontrolled type 2 diabetes mellitus with hyperglycemia ([facility])   . Labile blood glucose   . Panic disorder with agoraphobia and moderate panic attacks   . Major depressive disorder, recurrent episode, moderate ([place])   . MCI (mild cognitive impairment) with memory loss   . Acute ischemic right middle cerebral artery (MCA) stroke ([place]) 03/09/2019  . Left hemiparesis ([place])   . Bradycardia   . Tachypnea   . SIRS (systemic inflammatory response syndrome) (HCC)   . Hypokalemia   . Diabetes mellitus type 2 in obese ([place])   . Tobacco abuse   . History of breast cancer   . Cognitive deficits   . Dysphagia, post-stroke   . Stroke (cerebrum) ([place]) 03/02/2019  . Middle cerebral artery embolism, right 03/02/2019  . Chest pain 12/19/2015  . Numbness 12/19/2015  . Femoral  hernia 03/24/2014  . Dyslipidemia 09/10/2011  . BRCA1 positive 09/10/2011  . Breast cancer ([place]) 09/10/2011  . H/O gastric bypass; 05/14/11([facility]) 09/10/2011  . Essential hypertension 10/28/2008  . DEPRESSION/ANXIETY 02/19/2007  . CARPAL TUNNEL SYNDROME, RIGHT 02/19/2007  . ALLERGIC RHINITIS 02/19/2007  . GERD 02/19/2007  . IRRITABLE BOWEL SYNDROME 02/19/2007  . HERPES GENITALIS 12/30/2006  . Diabetes ([place]) 12/30/2006  . OSTEOPENIA 12/30/2006  . ANOMALY, CONGENITAL, NERVOUS SYSTEM NEC 12/30/2006    [facility] 11/09/2019, 12:24 PM  [facility] [address] [place], [place], [address] Phone: [phone]   Fax:  [phone]  Name: ISMAHAN LIPPMAN MRN: [id number] Date of Birth: Jun 25, 1970   Vianne Bulls, OTR/L [facility] [address] [place], [place]  [address] [phone] phone [phone] 11/09/19 12:24 PM

## 2019-11-09 NOTE — Therapy (Signed)
[facility] [address] [place], [place], [address] Phone: [phone]   Fax:  [phone]  Physical Therapy Treatment  Patient Details  Name: Ann Lopez MRN: [id number] Date of Birth: [birth year] Referring Provider (PT): Dr. Leeroy Cha   Encounter Date: 11/09/2019   PT End of Session - 11/09/19 1231    Visit Number 20    Number of Visits 26    Date for PT Re-Evaluation 12/23/19    Authorization Type UHC Medicare/Medicaid    Authorization Time Period 09/24/2019-12/23/2019    Progress Note Due on Visit 29   Progress note completed at 11/05/2019 visit, #19   PT Start Time 1231    PT Stop Time 1314    PT Time Calculation (min) 43 min    Equipment Utilized During Treatment Gait belt    Activity Tolerance Patient tolerated treatment well    Behavior During Therapy [facility] for tasks assessed/performed           Past Medical History:  Diagnosis Date  . Allergy   . Anxiety   . Asthma   . Back pain   . Breast cancer ([place])   . Cancer ([place])   . Depression   . Diabetes mellitus   . Headache   . Hyperlipemia   . Osteopenia   . Osteoporosis    osteopenia  . Stroke [facility])     Past Surgical History:  Procedure Laterality Date  . ABDOMINAL HYSTERECTOMY     B oophorectomy for BRCA1 gene  . BREAST SURGERY    . CESAREAN SECTION    . INCISION AND DRAINAGE ABSCESS Left 11/14/2012   Procedure: INCISION AND DRAINAGE ABSCESS LEFT GROIN;  Surgeon: Jamesetta So, MD;  Location: [facility];  Service: General;  Laterality: Left;  . IR ANGIO VERTEBRAL SEL SUBCLAVIAN INNOMINATE UNI R MOD SED  03/02/2019  . IR CT HEAD LTD  03/02/2019  . IR INTRAVSC STENT CERV CAROTID W/O EMB-PROT MOD SED INC ANGIO  03/02/2019  . IR PERCUTANEOUS ART THROMBECTOMY/INFUSION INTRACRANIAL INC DIAG ANGIO  03/02/2019  . LYMPHADENECTOMY    . MASTECTOMY Bilateral   . RADIOLOGY WITH ANESTHESIA N/A 03/02/2019   Procedure: IR WITH ANESTHESIA;  Surgeon:  Luanne Bras, MD;  Location: [facility];  Service: Radiology;  Laterality: N/A;    There were no vitals filed for this visit.   Subjective Assessment - 11/09/19 1235    Subjective Believes the shoulder is getting better. No falls. No new complaints/concerns. Reports HEP additions went well.    Pertinent History YQI:[id number], hyperlipidemia, smoking, breast cancer, R carotid stenosis requiring emergent thrombectomy    Patient Stated Goals Pt wants all the movement back in L side.    Currently in Pain? No/denies    Pain Onset More than a month ago                             [facility] Adult PT Treatment/Exercise - 11/09/19 0001      Ambulation/Gait   Ambulation/Gait Yes    Ambulation/Gait Assistance 5: Supervision    Ambulation/Gait Assistance Details completed throughout therapy session with activities    Assistive device None    Gait Pattern Step-through pattern;Decreased stance time - left;Decreased arm swing - left;Trendelenburg;Lateral trunk lean to left    Ambulation Surface Level;Indoor      High Level Balance   High Level Balance Activities Tandem walking    High Level Balance Comments Completed  tandem walking across blue balance beam, x 3 laps down and back with single UE support with completion.       Neuro Re-ed    Neuro Re-ed Details  --      Exercises   Exercises Knee/Hip    Other Exercises  Completed mini squats on blue balance beam, 2 x 10 reps with light UE support. PT providing verbal cues for improve alignment and form with completion.       Knee/Hip Exercises: Standing   Heel Raises Both;1 set;15 reps;Limitations    Heel Raises Limitations completed with single UE support from RUE in // bars.     Terminal Knee Extension Strengthening;Left;2 sets;10 reps;Theraband;Limitations    Theraband Level (Terminal Knee Extension) Level 3 (Green)    Terminal Knee Extension Limitations completed 2 x 10 reps with green theraband, verbal cues for improved  form.     Hip Abduction Stengthening;10 reps;Knee straight;Right;Left;2 sets    Abduction Limitations completed with 2# ankle weights    Hip Extension Stengthening;Right;Left;1 set;10 reps;Knee straight    Extension Limitations completed with 2# ankle weights    Lateral Step Up Left;1 set;15 reps;Hand Hold: 1;Step Height: 6"    Lateral Step Up Limitations verbal cues for proper posture with completion    Forward Step Up Left;1 set;15 reps;Hand Hold: 1;Step Height: 6";Limitations    Forward Step Up Limitations completed with fot up brace on LLE. progressed to completing without UE support    Other Standing Knee Exercises Completed standing marching with 2# ankle weights, 2 x 10 reps with light UE support from RUE with completion. With red theraband on BLE, completed triplanar toe taps (ant/later/posterior) with focus on improved step and control with completion, completed 1 x 15 reps with LLE.                     PT Short Term Goals - 10/21/19 0853      PT SHORT TERM GOAL #1   Title Pt will be independent with HEP for improved balance, strength, gait.  TARGET 5 weeks for all LTGs: 10/23/2019    Baseline 10/21/19: pt. verbalizes independence with HEP.    Time 5    Period Weeks    Status Achieved      PT SHORT TERM GOAL #2   Title Pt will improve 5x sit<>stand score to less than or equal to 12 seconds for improved functional strength.    Baseline 10/21/19: 12.31 seconds to complete 5x sit<>stand.    Time 5    Period Weeks    Status Partially Met      PT SHORT TERM GOAL #3   Title Pt will improve FGA score to at least 20/30 for decreased fall risk.    Baseline 10/21/19: pt. scored a 24/30 on the FGA today showing a decrease in fall risk.    Time 5    Period Weeks    Status Achieved      PT SHORT TERM GOAL #4   Title Pt will verbalize understanding of fall prevention in home environment.    Baseline 10/21/19: pt. verbalizes fall prevention techniques like using a rubber mat in  the shower, and handrails when available.    Time 5    Period Weeks    Status Achieved             PT Long Term Goals - 09/24/19 1451      PT LONG TERM GOAL #1   Title Pt will be independent  with progression of HEP for strength, balance, and gait.  TARGET 11/20/2019    Time 9    Period Weeks    Status New      PT LONG TERM GOAL #2   Title Pt will improve 5x sit<>stand score to less than or equal to 10 seconds for improved functional strength.    Time 9    Period Weeks    Status New      PT LONG TERM GOAL #3   Title Pt will improve Functional Gait Assessment score to at least 22/30 for decreased fall risk.    Time 9    Period Weeks    Status New      PT LONG TERM GOAL #4   Title Pt will improve gait velocity to at least 3 ft/sec for improved gait efficiency for community ambulation.    Time 9    Period Weeks    Status New      PT LONG TERM GOAL #5   Title Pt will ambulate at least 1000 ft, outdoor and indoor surfaces, appropriate device as needed, modified independently, for improved community gait.    Time 9    Period Weeks    Status New      Additional Long Term Goals   Additional Long Term Goals Yes      PT LONG TERM GOAL #6   Title FOTO score to improve by at least 10 % for improved overall reported functional mobility.    Time 9    Period Weeks    Status New                 Plan - 11/09/19 1316    Clinical Impression Statement Today's skilled session focused on continued standing strengthening exercises targeting BLE. patient able to tolerate therapy session well, including addition of ankle weights. Patient is demos good progress with PT services and will continue to benefit from skilled PT services to progress toward all goals.    Personal Factors and Comorbidities Comorbidity 3+    Comorbidities PMH:  diabetes, hyperlipidemia, smoking, breast cancer, R carotid stenosis requiring emergent thrombectomy, anxiety depression    Examination-Activity  Limitations Locomotion Level;Transfers;Reach Overhead;Stand;Stairs;Lift;Dressing    Examination-Participation Restrictions Community Activity;Other   caring for grandchild   Stability/Clinical Decision Making Evolving/Moderate complexity    Rehab Potential Good    PT Frequency Other (comment)   1x/wk for 1 week, then 3x/wk for 8 weeks   PT Duration Other (comment)   9 weeks   PT Treatment/Interventions ADLs/Self Care Home Management;Electrical Stimulation;DME Instruction;Gait training;Stair training;Functional mobility training;Therapeutic activities;Therapeutic exercise;Balance training;Neuromuscular re-education;Manual techniques;Orthotic Fit/Training;Patient/family education;Passive range of motion;Aquatic Therapy    PT Next Visit Plan gait with reciprocal arm swing; continue to work on balance reactions and left LE strengthening with emphasis on left LE weight bearing    PT Home Exercise Plan Access Code: [email or web address]    Consulted and Agree with Plan of Care Patient           Patient will benefit from skilled therapeutic intervention in order to improve the following deficits and impairments:  Abnormal gait, Decreased coordination, Difficulty walking, Impaired tone, Impaired UE functional use, Decreased activity tolerance, Decreased balance, Decreased mobility, Decreased strength, Postural dysfunction  Visit Diagnosis: Hemiplegia and hemiparesis following cerebral infarction affecting left non-dominant side (HCC)  Other abnormalities of gait and mobility  Unsteadiness on feet  Muscle weakness (generalized)     Problem List Patient Active Problem List   Diagnosis Date Noted  .  Vascular headache   . Other chronic pain   . Uncontrolled type 2 diabetes mellitus with hyperglycemia ([place])   . Labile blood glucose   . Panic disorder with agoraphobia and moderate panic attacks   . Major depressive disorder, recurrent episode, moderate ([place])   . MCI (mild cognitive impairment) with memory  loss   . Acute ischemic right middle cerebral artery (MCA) stroke ([place]) 03/09/2019  . Left hemiparesis ([place])   . Bradycardia   . Tachypnea   . SIRS (systemic inflammatory response syndrome) (HCC)   . Hypokalemia   . Diabetes mellitus type 2 in obese ([place])   . Tobacco abuse   . History of breast cancer   . Cognitive deficits   . Dysphagia, post-stroke   . Stroke (cerebrum) ([place]) 03/02/2019  . Middle cerebral artery embolism, right 03/02/2019  . Chest pain 12/19/2015  . Numbness 12/19/2015  . Femoral hernia 03/24/2014  . Dyslipidemia 09/10/2011  . BRCA1 positive 09/10/2011  . Breast cancer ([place]) 09/10/2011  . H/O gastric bypass; 05/14/11([facility]) 09/10/2011  . Essential hypertension 10/28/2008  . DEPRESSION/ANXIETY 02/19/2007  . CARPAL TUNNEL SYNDROME, RIGHT 02/19/2007  . ALLERGIC RHINITIS 02/19/2007  . GERD 02/19/2007  . IRRITABLE BOWEL SYNDROME 02/19/2007  . HERPES GENITALIS 12/30/2006  . Diabetes ([place]) 12/30/2006  . OSTEOPENIA 12/30/2006  . ANOMALY, CONGENITAL, NERVOUS SYSTEM NEC 12/30/2006    Jones Bales, PT,DPT 11/09/2019, 2:05 PM  [facility] [address] [place], [place], [address] Phone: [phone]   Fax:  [phone]  Name: DEMIKA LANGENDERFER MRN: [id number] Date of Birth: [birth year]

## 2019-11-11 ENCOUNTER — Ambulatory Visit: Payer: Medicare Other

## 2019-11-11 ENCOUNTER — Ambulatory Visit: Payer: Medicare Other | Admitting: Occupational Therapy

## 2019-11-11 ENCOUNTER — Encounter: Payer: Self-pay | Admitting: Occupational Therapy

## 2019-11-11 ENCOUNTER — Other Ambulatory Visit: Payer: Self-pay

## 2019-11-11 DIAGNOSIS — R293 Abnormal posture: Secondary | ICD-10-CM

## 2019-11-11 DIAGNOSIS — M6281 Muscle weakness (generalized): Secondary | ICD-10-CM

## 2019-11-11 DIAGNOSIS — R2681 Unsteadiness on feet: Secondary | ICD-10-CM

## 2019-11-11 DIAGNOSIS — R278 Other lack of coordination: Secondary | ICD-10-CM

## 2019-11-11 DIAGNOSIS — R29818 Other symptoms and signs involving the nervous system: Secondary | ICD-10-CM

## 2019-11-11 DIAGNOSIS — I69354 Hemiplegia and hemiparesis following cerebral infarction affecting left non-dominant side: Secondary | ICD-10-CM

## 2019-11-11 DIAGNOSIS — M25512 Pain in left shoulder: Secondary | ICD-10-CM

## 2019-11-11 DIAGNOSIS — R2689 Other abnormalities of gait and mobility: Secondary | ICD-10-CM

## 2019-11-11 DIAGNOSIS — R29898 Other symptoms and signs involving the musculoskeletal system: Secondary | ICD-10-CM

## 2019-11-11 NOTE — Therapy (Signed)
[facility] [address] [place], [place], [address] Phone: [phone]   Fax:  [phone]  Physical Therapy Treatment  Patient Details  Name: Ann Lopez MRN: [id number] Date of Birth: 1970-11-20 Referring Provider (PT): Dr. Leeroy Cha   Encounter Date: 11/11/2019   PT End of Session - 11/11/19 1405    Visit Number 21    Number of Visits 26    Date for PT Re-Evaluation 12/23/19    Authorization Type UHC Medicare/Medicaid    Authorization Time Period 09/24/2019-12/23/2019    Progress Note Due on Visit 29   Progress note completed at 11/05/2019 visit, #19   PT Start Time 1402    PT Stop Time 1445    PT Time Calculation (min) 43 min    Equipment Utilized During Treatment Gait belt    Activity Tolerance Patient tolerated treatment well    Behavior During Therapy [facility] for tasks assessed/performed           Past Medical History:  Diagnosis Date  . Allergy   . Anxiety   . Asthma   . Back pain   . Breast cancer ([place])   . Cancer ([place])   . Depression   . Diabetes mellitus   . Headache   . Hyperlipemia   . Osteopenia   . Osteoporosis    osteopenia  . Stroke [facility])     Past Surgical History:  Procedure Laterality Date  . ABDOMINAL HYSTERECTOMY     B oophorectomy for BRCA1 gene  . BREAST SURGERY    . CESAREAN SECTION    . INCISION AND DRAINAGE ABSCESS Left 11/14/2012   Procedure: INCISION AND DRAINAGE ABSCESS LEFT GROIN;  Surgeon: Jamesetta So, MD;  Location: [facility];  Service: General;  Laterality: Left;  . IR ANGIO VERTEBRAL SEL SUBCLAVIAN INNOMINATE UNI R MOD SED  03/02/2019  . IR CT HEAD LTD  03/02/2019  . IR INTRAVSC STENT CERV CAROTID W/O EMB-PROT MOD SED INC ANGIO  03/02/2019  . IR PERCUTANEOUS ART THROMBECTOMY/INFUSION INTRACRANIAL INC DIAG ANGIO  03/02/2019  . LYMPHADENECTOMY    . MASTECTOMY Bilateral   . RADIOLOGY WITH ANESTHESIA N/A 03/02/2019   Procedure: IR WITH ANESTHESIA;  Surgeon:  Luanne Bras, MD;  Location: [place];  Service: Radiology;  Laterality: N/A;    There were no vitals filed for this visit.   Subjective Assessment - 11/11/19 1405    Subjective Patient reports no new concerns/complaints since last visit. No pain today. No falls.    Pertinent History TSV:[id number], hyperlipidemia, smoking, breast cancer, R carotid stenosis requiring emergent thrombectomy    Patient Stated Goals Pt wants all the movement back in L side.    Currently in Pain? No/denies    Pain Onset More than a month ago                             [facility] Adult PT Treatment/Exercise - 11/11/19 0001      Ambulation/Gait   Ambulation/Gait Yes    Ambulation/Gait Assistance 5: Supervision    Ambulation/Gait Assistance Details completed gait with walking sticks in BUE x 400 ft for focus on improved arm swing. Patient reporting that she feels gait is the same with and without toe up brace. Completed ambulation x 50 ft with toe up brace donned and x 50 ft with toe up brace off. PT educating patient on benefits of wearing toe up brace for improved toe clearance  with ambulation. Patient reports she feels she has to concentrate more without the brace donned.     Ambulation Distance (Feet) 400 Feet    Assistive device None    Gait Pattern Step-through pattern;Decreased stance time - left;Decreased arm swing - left;Trendelenburg;Lateral trunk lean to left    Ambulation Surface Level;Indoor      Exercises   Exercises Other Exercises    Other Exercises  Completed bridge with isometric hip adduction with ball squeeze, 1 x 10 reps with verbal cues for control with lowering. Completed bridge with hip abduction against red theraband, 2 x 10 reps. With bridges PT providing verbal cues for technique as needed. pt demo more difficulty with hip abduction with bridge.       Lumbar Exercises: Sidelying   Hip Abduction Left;10 reps;Limitations    Hip Abduction Weights (lbs) completed x 2 sets.  Patietn requiring tactile/verbal cues for alignment of LLE with completion.       Knee/Hip Exercises: Standing   Other Standing Knee Exercises Completed side stepping 5 x 10' with green theraband around thighs, verbal cues for improved step length. Completed with single UE support. In // bars, completed alternating stepping with single leg onto bosu (ball portion up) with opposite knee drive, completed with single UE support. Completed 2 x 10 reps stepping up with each lower extremity. Small rest breaks required between sets due to fatigue.                     PT Short Term Goals - 10/21/19 0853      PT SHORT TERM GOAL #1   Title Pt will be independent with HEP for improved balance, strength, gait.  TARGET 5 weeks for all LTGs: 10/23/2019    Baseline 10/21/19: pt. verbalizes independence with HEP.    Time 5    Period Weeks    Status Achieved      PT SHORT TERM GOAL #2   Title Pt will improve 5x sit<>stand score to less than or equal to 12 seconds for improved functional strength.    Baseline 10/21/19: 12.31 seconds to complete 5x sit<>stand.    Time 5    Period Weeks    Status Partially Met      PT SHORT TERM GOAL #3   Title Pt will improve FGA score to at least 20/30 for decreased fall risk.    Baseline 10/21/19: pt. scored a 24/30 on the FGA today showing a decrease in fall risk.    Time 5    Period Weeks    Status Achieved      PT SHORT TERM GOAL #4   Title Pt will verbalize understanding of fall prevention in home environment.    Baseline 10/21/19: pt. verbalizes fall prevention techniques like using a rubber mat in the shower, and handrails when available.    Time 5    Period Weeks    Status Achieved             PT Long Term Goals - 09/24/19 1451      PT LONG TERM GOAL #1   Title Pt will be independent with progression of HEP for strength, balance, and gait.  TARGET 11/20/2019    Time 9    Period Weeks    Status New      PT LONG TERM GOAL #2   Title Pt will  improve 5x sit<>stand score to less than or equal to 10 seconds for improved functional strength.  Time 9    Period Weeks    Status New      PT LONG TERM GOAL #3   Title Pt will improve Functional Gait Assessment score to at least 22/30 for decreased fall risk.    Time 9    Period Weeks    Status New      PT LONG TERM GOAL #4   Title Pt will improve gait velocity to at least 3 ft/sec for improved gait efficiency for community ambulation.    Time 9    Period Weeks    Status New      PT LONG TERM GOAL #5   Title Pt will ambulate at least 1000 ft, outdoor and indoor surfaces, appropriate device as needed, modified independently, for improved community gait.    Time 9    Period Weeks    Status New      Additional Long Term Goals   Additional Long Term Goals Yes      PT LONG TERM GOAL #6   Title FOTO score to improve by at least 10 % for improved overall reported functional mobility.    Time 9    Period Weeks    Status New                 Plan - 11/11/19 1729    Clinical Impression Statement Continued therex focused on BLE strenghtening and progressed as tolerated. Completed gait training with walking poles to further promote improved arm swing with ambulation. Patient will continue to benefit from skilled PT services to progress toward goals.    Personal Factors and Comorbidities Comorbidity 3+    Comorbidities PMH:  diabetes, hyperlipidemia, smoking, breast cancer, R carotid stenosis requiring emergent thrombectomy, anxiety depression    Examination-Activity Limitations Locomotion Level;Transfers;Reach Overhead;Stand;Stairs;Lift;Dressing    Examination-Participation Restrictions Community Activity;Other   caring for grandchild   Stability/Clinical Decision Making Evolving/Moderate complexity    Rehab Potential Good    PT Frequency Other (comment)   1x/wk for 1 week, then 3x/wk for 8 weeks   PT Duration Other (comment)   9 weeks   PT Treatment/Interventions ADLs/Self  Care Home Management;Electrical Stimulation;DME Instruction;Gait training;Stair training;Functional mobility training;Therapeutic activities;Therapeutic exercise;Balance training;Neuromuscular re-education;Manual techniques;Orthotic Fit/Training;Patient/family education;Passive range of motion;Aquatic Therapy    PT Next Visit Plan gait with reciprocal arm swing; continue to work on balance reactions and left LE strengthening with emphasis on left LE weight bearing    PT Home Exercise Plan Access Code: [email or web address]    Consulted and Agree with Plan of Care Patient           Patient will benefit from skilled therapeutic intervention in order to improve the following deficits and impairments:  Abnormal gait, Decreased coordination, Difficulty walking, Impaired tone, Impaired UE functional use, Decreased activity tolerance, Decreased balance, Decreased mobility, Decreased strength, Postural dysfunction  Visit Diagnosis: Hemiplegia and hemiparesis following cerebral infarction affecting left non-dominant side (HCC)  Other abnormalities of gait and mobility  Unsteadiness on feet  Muscle weakness (generalized)     Problem List Patient Active Problem List   Diagnosis Date Noted  . Vascular headache   . Other chronic pain   . Uncontrolled type 2 diabetes mellitus with hyperglycemia ([place])   . Labile blood glucose   . Panic disorder with agoraphobia and moderate panic attacks   . Major depressive disorder, recurrent episode, moderate ([place])   . MCI (mild cognitive impairment) with memory loss   . Acute ischemic right middle cerebral artery (MCA) stroke ([place])  03/09/2019  . Left hemiparesis ([place])   . Bradycardia   . Tachypnea   . SIRS (systemic inflammatory response syndrome) (HCC)   . Hypokalemia   . Diabetes mellitus type 2 in obese ([place])   . Tobacco abuse   . History of breast cancer   . Cognitive deficits   . Dysphagia, post-stroke   . Stroke (cerebrum) ([place]) 03/02/2019  . Middle  cerebral artery embolism, right 03/02/2019  . Chest pain 12/19/2015  . Numbness 12/19/2015  . Femoral hernia 03/24/2014  . Dyslipidemia 09/10/2011  . BRCA1 positive 09/10/2011  . Breast cancer ([place]) 09/10/2011  . H/O gastric bypass; 05/14/11([facility]) 09/10/2011  . Essential hypertension 10/28/2008  . DEPRESSION/ANXIETY 02/19/2007  . CARPAL TUNNEL SYNDROME, RIGHT 02/19/2007  . ALLERGIC RHINITIS 02/19/2007  . GERD 02/19/2007  . IRRITABLE BOWEL SYNDROME 02/19/2007  . HERPES GENITALIS 12/30/2006  . Diabetes ([place]) 12/30/2006  . OSTEOPENIA 12/30/2006  . ANOMALY, CONGENITAL, NERVOUS SYSTEM NEC 12/30/2006    Jones Bales, PT, DPT 11/11/2019, 5:35 PM  [facility] [address] [place], [place], [address] Phone: [phone]   Fax:  [phone]  Name: Ann Lopez MRN: [id number] Date of Birth: Dec 28, 1970

## 2019-11-11 NOTE — Therapy (Signed)
[facility] [address] [place], [place], [address] Phone: [phone]   Fax:  [phone]  Occupational Therapy Treatment and Progress Note  Patient Details  Name: Ann Lopez MRN: [id number] Date of Birth: [birth year] Referring Provider (OT): Dr. Leeroy Cha  This progress note covers dates of service from 09/25/19 - 11/11/19  Encounter Date: 11/11/2019   OT End of Session - 11/11/19 1723    Visit Number 10    Number of Visits 25    Date for OT Re-Evaluation 11/24/19    Authorization Type UHC Medicare/ Medicaid, covered 100% (medicare guidelines)    Authorization Time Period cert. date 09/25/19-12/24/19    Authorization - Number of Visits 10    Progress Note Due on Visit 20    OT Start Time 1445    OT Stop Time 1530    OT Time Calculation (min) 45 min    Activity Tolerance Patient tolerated treatment well    Behavior During Therapy [facility] for tasks assessed/performed           Past Medical History:  Diagnosis Date  . Allergy   . Anxiety   . Asthma   . Back pain   . Breast cancer ([place])   . Cancer ([place])   . Depression   . Diabetes mellitus   . Headache   . Hyperlipemia   . Osteopenia   . Osteoporosis    osteopenia  . Stroke [facility])     Past Surgical History:  Procedure Laterality Date  . ABDOMINAL HYSTERECTOMY     B oophorectomy for BRCA1 gene  . BREAST SURGERY    . CESAREAN SECTION    . INCISION AND DRAINAGE ABSCESS Left 11/14/2012   Procedure: INCISION AND DRAINAGE ABSCESS LEFT GROIN;  Surgeon: Jamesetta So, MD;  Location: [facility];  Service: General;  Laterality: Left;  . IR ANGIO VERTEBRAL SEL SUBCLAVIAN INNOMINATE UNI R MOD SED  03/02/2019  . IR CT HEAD LTD  03/02/2019  . IR INTRAVSC STENT CERV CAROTID W/O EMB-PROT MOD SED INC ANGIO  03/02/2019  . IR PERCUTANEOUS ART THROMBECTOMY/INFUSION INTRACRANIAL INC DIAG ANGIO  03/02/2019  . LYMPHADENECTOMY    . MASTECTOMY Bilateral   . RADIOLOGY WITH  ANESTHESIA N/A 03/02/2019   Procedure: IR WITH ANESTHESIA;  Surgeon: Luanne Bras, MD;  Location: [place];  Service: Radiology;  Laterality: N/A;    There were no vitals filed for this visit.   Subjective Assessment - 11/11/19 1446    Subjective  Patient indicates no pain currently, but reports that when she lifts arm to place on the arm of the couch, left arm is painful    Currently in Pain? No/denies    Pain Score 0-No pain                        OT Treatments/Exercises (OP) - 11/11/19 0001      ADLs   ADL Comments Reviewed short term goals, and patient has met or exceeded all.  Patient pleased with progress thus far, and eager for more functional use and less pain in LUE - although she indictaes that is far less painful now, and moving significantly better now than when she started.       Neurological Re-education Exercises   Other Exercises 1 Patient reports inability to sleep on her left side, and this was her preferred side prior to stroke.  Practiced modified side sleep position with left arm at ~ 45*  angle to body.  Worked on gentle body on arm motion through rolling initially from supine toward left, then from supine left and right.  Cueing to allow head to move with body.  Patient without discomfort during this activity.        Manual Therapy   Manual Therapy Soft tissue mobilization    Soft tissue mobilization Posterior superior scapula.                    OT Education - 11/11/19 1722    Education Details sidelying partially on left side    Person(s) Educated Patient    Methods Explanation;Demonstration;Verbal cues    Comprehension Verbalized understanding;Returned demonstration            OT Short Term Goals - 11/11/19 1447      OT SHORT TERM GOAL #1   Title Pt will be independent with initial HEP.--check STGs 11/13/19 (extended due to scheduling)    Status Achieved      OT SHORT TERM GOAL #2   Title Pt will demo at least 70* L shoulder  flexion for functional reaching without pain.    Status Achieved      OT SHORT TERM GOAL #3   Title Pt will verbalize understanding of proper positioning of LUE for decr pain.    Status Achieved      OT SHORT TERM GOAL #4   Title Pt will be able to use LUE in simple light home maintenance tasks as non-dominant assist at least 50% of the time with pain less than 3/10 (wiping table, folding clothes, sweeping).    Status Achieved      OT SHORT TERM GOAL #5   Title Pt will demo improved LUE functional reaching for ADLs as shown by improving score on box and blocks test by at least 6.    Status Achieved   36 blocks     OT SHORT TERM GOAL #6   Title Pt will verbalize understanding of adaptive strategies/AE to incr independence with cooking tasks.    Status Achieved             OT Long Term Goals - 09/25/19 1820      OT LONG TERM GOAL #1   Title Pt will be independent with updated HEP for LUE--check LTGs 11/24/19    Time 8    Period Weeks    Status New      OT LONG TERM GOAL #2   Title Pt will demo at least 90* L shoulder flex without pain for functional reaching.    Baseline 40*    Time 8    Period Weeks    Status New      OT LONG TERM GOAL #3   Title Pt will be able to use LUE as nondominant assist for functional tasks at least 75% of the time for light ADLs and IADLs with pain consistently less than or equal to 2/10.    Time 8    Period Weeks    Status New      OT LONG TERM GOAL #4   Title Pt will improve L grip strength by at least 8lbs to assist in opening containers/home maintenance tasks.    Baseline 42.7lbs    Time 8    Period Weeks    Status New      OT LONG TERM GOAL #5   Title Pt will improve LUE coordination as shown by improving time on 9-hole peg test by at  least 8sec.    Baseline 40.81sec    Time 8    Period Weeks    Status New      OT LONG TERM GOAL #6   Title Pt will improve LUE functional reaching for ADLs as shown by improving score on box and  blocks test by at least 10.    Baseline 28    Time 8    Period Weeks    Status New                 Plan - 11/11/19 1723    Clinical Impression Statement Patient has met all short term goals and is very please with her progress thus far in terms of decreased left shoulder pain, and more active, controlled and improved functional use of non dominant extremity.  Patient will continue to benefit from skilled OT intervention to work toward long term goals    OT Frequency --   eval + 3x/week modified due to scheduling   OT Duration 8 weeks    OT Treatment/Interventions Self-care/ADL training;Moist Heat;DME and/or AE instruction;Therapeutic activities;Aquatic Therapy;Therapeutic exercise;Ultrasound;Passive range of motion;Functional Mobility Training;Neuromuscular education;Cryotherapy;Electrical Stimulation;Manual Therapy;Patient/family education    Plan Continue with proper positioning of LUE, manual therapy prn to address L shoulder pain and neuro re-ed (L shoulder/scapular/trunk), ultrasound    Consulted and Agree with Plan of Care Patient           Patient will benefit from skilled therapeutic intervention in order to improve the following deficits and impairments:           Visit Diagnosis: Hemiplegia and hemiparesis following cerebral infarction affecting left non-dominant side (HCC)  Unsteadiness on feet  Muscle weakness (generalized)  Abnormal posture  Acute pain of left shoulder  Other symptoms and signs involving the nervous system  Other symptoms and signs involving the musculoskeletal system  Other lack of coordination    Problem List Patient Active Problem List   Diagnosis Date Noted  . Vascular headache   . Other chronic pain   . Uncontrolled type 2 diabetes mellitus with hyperglycemia ([place])   . Labile blood glucose   . Panic disorder with agoraphobia and moderate panic attacks   . Major depressive disorder, recurrent episode, moderate ([place])   .  MCI (mild cognitive impairment) with memory loss   . Acute ischemic right middle cerebral artery (MCA) stroke ([place]) 03/09/2019  . Left hemiparesis ([place])   . Bradycardia   . Tachypnea   . SIRS (systemic inflammatory response syndrome) (HCC)   . Hypokalemia   . Diabetes mellitus type 2 in obese ([place])   . Tobacco abuse   . History of breast cancer   . Cognitive deficits   . Dysphagia, post-stroke   . Stroke (cerebrum) ([place]) 03/02/2019  . Middle cerebral artery embolism, right 03/02/2019  . Chest pain 12/19/2015  . Numbness 12/19/2015  . Femoral hernia 03/24/2014  . Dyslipidemia 09/10/2011  . BRCA1 positive 09/10/2011  . Breast cancer ([place]) 09/10/2011  . H/O gastric bypass; 05/14/11([facility]) 09/10/2011  . Essential hypertension 10/28/2008  . DEPRESSION/ANXIETY 02/19/2007  . CARPAL TUNNEL SYNDROME, RIGHT 02/19/2007  . ALLERGIC RHINITIS 02/19/2007  . GERD 02/19/2007  . IRRITABLE BOWEL SYNDROME 02/19/2007  . HERPES GENITALIS 12/30/2006  . Diabetes ([place]) 12/30/2006  . OSTEOPENIA 12/30/2006  . ANOMALY, CONGENITAL, NERVOUS SYSTEM NEC 12/30/2006    Mariah Milling, OTR/L 11/11/2019, 5:27 PM  [facility] [address] [place], [place], [address] Phone: [phone]  Fax:  [phone]  Name: Ann Lopez MRN: [id number] Date of Birth: 03-Jul-1970

## 2019-11-13 ENCOUNTER — Encounter: Payer: Self-pay | Admitting: Physical Therapy

## 2019-11-13 ENCOUNTER — Ambulatory Visit: Payer: Medicare Other | Admitting: Occupational Therapy

## 2019-11-13 ENCOUNTER — Other Ambulatory Visit: Payer: Self-pay

## 2019-11-13 ENCOUNTER — Ambulatory Visit: Payer: Medicare Other | Admitting: Physical Therapy

## 2019-11-13 DIAGNOSIS — I69354 Hemiplegia and hemiparesis following cerebral infarction affecting left non-dominant side: Secondary | ICD-10-CM

## 2019-11-13 DIAGNOSIS — M6281 Muscle weakness (generalized): Secondary | ICD-10-CM

## 2019-11-13 DIAGNOSIS — R2689 Other abnormalities of gait and mobility: Secondary | ICD-10-CM

## 2019-11-13 DIAGNOSIS — R293 Abnormal posture: Secondary | ICD-10-CM

## 2019-11-13 DIAGNOSIS — R2681 Unsteadiness on feet: Secondary | ICD-10-CM

## 2019-11-13 NOTE — Therapy (Signed)
[facility] [address] [place], [place], [address] Phone: [phone]   Fax:  [phone]  Occupational Therapy Treatment  Patient Details  Name: Ann Lopez MRN: [id number] Date of Birth: [birth year] Referring Provider (OT): Dr. Leeroy Cha   Encounter Date: 11/13/2019   OT End of Session - 11/13/19 1219    Visit Number 11    Number of Visits 25    Date for OT Re-Evaluation 11/24/19    Authorization Type UHC Medicare/ Medicaid, covered 100% (medicare guidelines)    Authorization Time Period cert. date 09/25/19-12/24/19    Authorization - Number of Visits 11    Progress Note Due on Visit 20    OT Start Time 1147    OT Stop Time 1227    OT Time Calculation (min) 40 min    Activity Tolerance Patient tolerated treatment well    Behavior During Therapy WFL for tasks assessed/performed           Past Medical History:  Diagnosis Date  . Allergy   . Anxiety   . Asthma   . Back pain   . Breast cancer ([facility])   . Cancer ([facility])   . Depression   . Diabetes mellitus   . Headache   . Hyperlipemia   . Osteopenia   . Osteoporosis    osteopenia  . Stroke [facility])     Past Surgical History:  Procedure Laterality Date  . ABDOMINAL HYSTERECTOMY     B oophorectomy for BRCA1 gene  . BREAST SURGERY    . CESAREAN SECTION    . INCISION AND DRAINAGE ABSCESS Left 11/14/2012   Procedure: INCISION AND DRAINAGE ABSCESS LEFT GROIN;  Surgeon: Jamesetta So, MD;  Location: [facility];  Service: General;  Laterality: Left;  . IR ANGIO VERTEBRAL SEL SUBCLAVIAN INNOMINATE UNI R MOD SED  03/02/2019  . IR CT HEAD LTD  03/02/2019  . IR INTRAVSC STENT CERV CAROTID W/O EMB-PROT MOD SED INC ANGIO  03/02/2019  . IR PERCUTANEOUS ART THROMBECTOMY/INFUSION INTRACRANIAL INC DIAG ANGIO  03/02/2019  . LYMPHADENECTOMY    . MASTECTOMY Bilateral   . RADIOLOGY WITH ANESTHESIA N/A 03/02/2019   Procedure: IR WITH ANESTHESIA;  Surgeon: Luanne Bras, MD;  Location: [facility];  Service: Radiology;  Laterality: N/A;    There were no vitals filed for this visit.   Subjective Assessment - 11/13/19 1218    Subjective  Pt reposts only mild pain    Currently in Pain? Yes    Pain Score 2     Pain Location Arm   shoulder   Pain Orientation Left    Pain Descriptors / Indicators Aching    Pain Type Acute pain    Pain Onset More than a month ago    Pain Frequency Intermittent    Aggravating Factors  malpositioning    Pain Relieving Factors repositioning                 Treatment Supine joint and soft tissue mobs to left shoulder and traps . Hotpack applied to left shoulder while soft tissue mobs to left upper arm biceps region due to tightness, trigger points. Sidelying AA/ROM shoulder flexion with therapist facilitating shoulder and scapula. Supine closed chain chest press and shoulder flexion min v.c/ facilitation. Ice pack applied to left biceps x 8 mins for pain relief, no adverse reactions. Flipping playing cards, dealing cards and stacking coins with left hand for increased fine motor coordination, min v.c/ facilitation for  positioning.                   OT Short Term Goals - 11/11/19 1447      OT SHORT TERM GOAL #1   Title Pt will be independent with initial HEP.--check STGs 11/13/19 (extended due to scheduling)    Status Achieved      OT SHORT TERM GOAL #2   Title Pt will demo at least 70* L shoulder flexion for functional reaching without pain.    Status Achieved      OT SHORT TERM GOAL #3   Title Pt will verbalize understanding of proper positioning of LUE for decr pain.    Status Achieved      OT SHORT TERM GOAL #4   Title Pt will be able to use LUE in simple light home maintenance tasks as non-dominant assist at least 50% of the time with pain less than 3/10 (wiping table, folding clothes, sweeping).    Status Achieved      OT SHORT TERM GOAL #5   Title Pt will demo improved LUE functional  reaching for ADLs as shown by improving score on box and blocks test by at least 6.    Status Achieved   36 blocks     OT SHORT TERM GOAL #6   Title Pt will verbalize understanding of adaptive strategies/AE to incr independence with cooking tasks.    Status Achieved             OT Long Term Goals - 09/25/19 1820      OT LONG TERM GOAL #1   Title Pt will be independent with updated HEP for LUE--check LTGs 11/24/19    Time 8    Period Weeks    Status New      OT LONG TERM GOAL #2   Title Pt will demo at least 90* L shoulder flex without pain for functional reaching.    Baseline 40*    Time 8    Period Weeks    Status New      OT LONG TERM GOAL #3   Title Pt will be able to use LUE as nondominant assist for functional tasks at least 75% of the time for light ADLs and IADLs with pain consistently less than or equal to 2/10.    Time 8    Period Weeks    Status New      OT LONG TERM GOAL #4   Title Pt will improve L grip strength by at least 8lbs to assist in opening containers/home maintenance tasks.    Baseline 42.7lbs    Time 8    Period Weeks    Status New      OT LONG TERM GOAL #5   Title Pt will improve LUE coordination as shown by improving time on 9-hole peg test by at least 8sec.    Baseline 40.81sec    Time 8    Period Weeks    Status New      OT LONG TERM GOAL #6   Title Pt will improve LUE functional reaching for ADLs as shown by improving score on box and blocks test by at least 10.    Baseline 28    Time 8    Period Weeks    Status New                 Plan - 11/13/19 1220    Clinical Impression Statement Pt is progressing towards goals with overall  decreasing shoulder pain.    OT Occupational Profile and History Detailed Assessment- Review of Records and additional review of physical, cognitive, psychosocial history related to current functional performance    Occupational performance deficits (Please refer to evaluation for details):  IADL's;ADL's;Leisure    Body Structure / Function / Physical Skills ADL;Balance;IADL;ROM;Strength;Tone;FMC;Coordination;UE functional use;Decreased knowledge of precautions;GMC;Decreased knowledge of use of DME;Pain    Rehab Potential Good    Clinical Decision Making Several treatment options, min-mod task modification necessary    Comorbidities Affecting Occupational Performance: May have comorbidities impacting occupational performance    Modification or Assistance to Complete Evaluation  Min-Moderate modification of tasks or assist with assess necessary to complete eval    OT Frequency --   eval + 3x week for 8 weeks (modified due to scheduling)   OT Treatment/Interventions Self-care/ADL training;Moist Heat;DME and/or AE instruction;Therapeutic activities;Aquatic Therapy;Therapeutic exercise;Ultrasound;Passive range of motion;Functional Mobility Training;Neuromuscular education;Cryotherapy;Electrical Stimulation;Manual Therapy;Patient/family education    Plan continue with proper positioning of LUE, manual therapy prn to address L shoulder pain and neuro re-ed (L shoulder/scapular/trunk), ultrasound    Consulted and Agree with Plan of Care Patient           Patient will benefit from skilled therapeutic intervention in order to improve the following deficits and impairments:   Body Structure / Function / Physical Skills: ADL, Balance, IADL, ROM, Strength, Tone, FMC, Coordination, UE functional use, Decreased knowledge of precautions, GMC, Decreased knowledge of use of DME, Pain       Visit Diagnosis: Hemiplegia and hemiparesis following cerebral infarction affecting left non-dominant side (HCC)  Muscle weakness (generalized)  Abnormal posture    Problem List Patient Active Problem List   Diagnosis Date Noted  . Vascular headache   . Other chronic pain   . Uncontrolled type 2 diabetes mellitus with hyperglycemia ([facility])   . Labile blood glucose   . Panic disorder with agoraphobia  and moderate panic attacks   . Major depressive disorder, recurrent episode, moderate ([facility])   . MCI (mild cognitive impairment) with memory loss   . Acute ischemic right middle cerebral artery (MCA) stroke ([facility]) 03/09/2019  . Left hemiparesis ([facility])   . Bradycardia   . Tachypnea   . SIRS (systemic inflammatory response syndrome) ([facility])   . Hypokalemia   . Diabetes mellitus type 2 in obese ([facility])   . Tobacco abuse   . History of breast cancer   . Cognitive deficits   . Dysphagia, post-stroke   . Stroke (cerebrum) ([facility]) 03/02/2019  . Middle cerebral artery embolism, right 03/02/2019  . Chest pain 12/19/2015  . Numbness 12/19/2015  . Femoral hernia 03/24/2014  . Dyslipidemia 09/10/2011  . BRCA1 positive 09/10/2011  . Breast cancer ([facility]) 09/10/2011  . H/O gastric bypass; 05/14/11([facility]) 09/10/2011  . Essential hypertension 10/28/2008  . DEPRESSION/ANXIETY 02/19/2007  . CARPAL TUNNEL SYNDROME, RIGHT 02/19/2007  . ALLERGIC RHINITIS 02/19/2007  . GERD 02/19/2007  . IRRITABLE BOWEL SYNDROME 02/19/2007  . HERPES GENITALIS 12/30/2006  . Diabetes ([facility]) 12/30/2006  . OSTEOPENIA 12/30/2006  . ANOMALY, CONGENITAL, NERVOUS SYSTEM NEC 12/30/2006    Ann Lopez 11/13/2019, 12:22 PM  [facility] [address] [place], [place], [address] Phone: [phone]   Fax:  [phone]  Name: Ann Lopez MRN: [id number] Date of Birth: 11/29/1970

## 2019-11-13 NOTE — Therapy (Signed)
[facility] [address] [place], [place], [address] Phone: [phone]   Fax:  [phone]  Physical Therapy Treatment  Patient Details  Name: Ann Lopez MRN: [id number] Date of Birth: 11/08/1970 Referring Provider (PT): Dr. Leeroy Cha   Encounter Date: 11/13/2019   PT End of Session - 11/13/19 1021    Visit Number 22    Number of Visits 26    Date for PT Re-Evaluation 12/23/19    Authorization Type UHC Medicare/Medicaid    Authorization Time Period 09/24/2019-12/23/2019    Progress Note Due on Visit 29   Progress note completed at 11/05/2019 visit, #19   PT Start Time 1019    PT Stop Time 1059    PT Time Calculation (min) 40 min    Equipment Utilized During Treatment Gait belt    Activity Tolerance Patient tolerated treatment well    Behavior During Therapy [facility] for tasks assessed/performed           Past Medical History:  Diagnosis Date  . Allergy   . Anxiety   . Asthma   . Back pain   . Breast cancer ([facility])   . Cancer ([facility])   . Depression   . Diabetes mellitus   . Headache   . Hyperlipemia   . Osteopenia   . Osteoporosis    osteopenia  . Stroke [facility])     Past Surgical History:  Procedure Laterality Date  . ABDOMINAL HYSTERECTOMY     B oophorectomy for BRCA1 gene  . BREAST SURGERY    . CESAREAN SECTION    . INCISION AND DRAINAGE ABSCESS Left 11/14/2012   Procedure: INCISION AND DRAINAGE ABSCESS LEFT GROIN;  Surgeon: Jamesetta So, MD;  Location: [facility];  Service: General;  Laterality: Left;  . IR ANGIO VERTEBRAL SEL SUBCLAVIAN INNOMINATE UNI R MOD SED  03/02/2019  . IR CT HEAD LTD  03/02/2019  . IR INTRAVSC STENT CERV CAROTID W/O EMB-PROT MOD SED INC ANGIO  03/02/2019  . IR PERCUTANEOUS ART THROMBECTOMY/INFUSION INTRACRANIAL INC DIAG ANGIO  03/02/2019  . LYMPHADENECTOMY    . MASTECTOMY Bilateral   . RADIOLOGY WITH ANESTHESIA N/A 03/02/2019   Procedure: IR WITH ANESTHESIA;  Surgeon:  Luanne Bras, MD;  Location: [facility];  Service: Radiology;  Laterality: N/A;    There were no vitals filed for this visit.   Subjective Assessment - 11/13/19 1021    Subjective No new complatins. No falls or pain to report.    Pertinent History PJA:[id number], hyperlipidemia, smoking, breast cancer, R carotid stenosis requiring emergent thrombectomy    Patient Stated Goals Pt wants all the movement back in L side.    Currently in Pain? No/denies    Pain Score 0-No pain                 [facility] Adult PT Treatment/Exercise - 11/13/19 1023      Transfers   Transfers Sit to Stand;Stand to Sit    Sit to Stand 5: Supervision    Stand to Sit 5: Supervision      Ambulation/Gait   Ambulation/Gait Yes    Ambulation/Gait Assistance 5: Supervision    Ambulation/Gait Assistance Details using trekking poles to promote reciprocal arm swing with gait indoors, then HHA on left side to promote arm swing with gait outdoors.     Ambulation Distance (Feet) 460 Feet   x1, 500 x1 in/outdoors   Assistive device None    Gait Pattern Step-through pattern;Decreased stance time -  left;Decreased arm swing - left;Trendelenburg;Lateral trunk lean to left    Ambulation Surface Level;Indoor               Balance Exercises - 11/13/19 1048      Balance Exercises: Standing   Standing Eyes Closed Narrow base of support (BOS);Wide (BOA);Head turns;Foam/compliant surface;Other reps (comment);30 secs;Limitations    Standing Eyes Closed Limitations on airex with no UE support: feet together for EC no head movements, progressing to feet apart for head movements left<>right, up<>down. min guard to min assist for balance with cues on posture and weight shifting to assist with balance.     Tandem Gait Forward;Retro;Upper extremity support;Foam/compliant surface;3 reps;Limitations    Tandem Gait Limitations on blue foam beam for 3 laps each way with light UE support on bars, cues for foot placement with stepping  left>right, min guard assist for safety.     Sidestepping Foam/compliant support;Upper extremity support;3 reps;Limitations    Sidestepping Limitations on blue foam beam for 3 laps each way with intermittent touch to bars for balance. cues on posture and step length. min guard assist for safety.     Sit to Stand Standard surface;Foam/compliant surface;Limitations    Sit to Stand Limitations with feet across red beam for 5 reps with emphasis on tall posture and controlled descent with sitting down.     Other Standing Exercises on inverted BOSU: light UE support on bil bars- rocking fwd/bwd, then laterally for 10 reps each way with emphasis on tall posture. min guard assist for safety; then left single leg stance on airex for right LE fwd/lateral/bwd kicks x 10 reps, light bil UE support with cues on posture.                PT Short Term Goals - 10/21/19 0853      PT SHORT TERM GOAL #1   Title Pt will be independent with HEP for improved balance, strength, gait.  TARGET 5 weeks for all LTGs: 10/23/2019    Baseline 10/21/19: pt. verbalizes independence with HEP.    Time 5    Period Weeks    Status Achieved      PT SHORT TERM GOAL #2   Title Pt will improve 5x sit<>stand score to less than or equal to 12 seconds for improved functional strength.    Baseline 10/21/19: 12.31 seconds to complete 5x sit<>stand.    Time 5    Period Weeks    Status Partially Met      PT SHORT TERM GOAL #3   Title Pt will improve FGA score to at least 20/30 for decreased fall risk.    Baseline 10/21/19: pt. scored a 24/30 on the FGA today showing a decrease in fall risk.    Time 5    Period Weeks    Status Achieved      PT SHORT TERM GOAL #4   Title Pt will verbalize understanding of fall prevention in home environment.    Baseline 10/21/19: pt. verbalizes fall prevention techniques like using a rubber mat in the shower, and handrails when available.    Time 5    Period Weeks    Status Achieved              PT Long Term Goals - 09/24/19 1451      PT LONG TERM GOAL #1   Title Pt will be independent with progression of HEP for strength, balance, and gait.  TARGET 11/20/2019    Time 9  Period Weeks    Status New      PT LONG TERM GOAL #2   Title Pt will improve 5x sit<>stand score to less than or equal to 10 seconds for improved functional strength.    Time 9    Period Weeks    Status New      PT LONG TERM GOAL #3   Title Pt will improve Functional Gait Assessment score to at least 22/30 for decreased fall risk.    Time 9    Period Weeks    Status New      PT LONG TERM GOAL #4   Title Pt will improve gait velocity to at least 3 ft/sec for improved gait efficiency for community ambulation.    Time 9    Period Weeks    Status New      PT LONG TERM GOAL #5   Title Pt will ambulate at least 1000 ft, outdoor and indoor surfaces, appropriate device as needed, modified independently, for improved community gait.    Time 9    Period Weeks    Status New      Additional Long Term Goals   Additional Long Term Goals Yes      PT LONG TERM GOAL #6   Title FOTO score to improve by at least 10 % for improved overall reported functional mobility.    Time 9    Period Weeks    Status New                 Plan - 11/13/19 1021    Clinical Impression Statement Today's skilled session focused on increased left arm swing with gait and balance reactions with only 3 rest breaks needed due to fatigue. The pt is progressing toward goals and should benefit from continued PT to progress toward unmet goals.    Personal Factors and Comorbidities Comorbidity 3+    Comorbidities PMH:  diabetes, hyperlipidemia, smoking, breast cancer, R carotid stenosis requiring emergent thrombectomy, anxiety depression    Examination-Activity Limitations Locomotion Level;Transfers;Reach Overhead;Stand;Stairs;Lift;Dressing    Examination-Participation Restrictions Community Activity;Other   caring for  grandchild   Stability/Clinical Decision Making Evolving/Moderate complexity    Rehab Potential Good    PT Frequency Other (comment)   1x/wk for 1 week, then 3x/wk for 8 weeks   PT Duration Other (comment)   9 weeks   PT Treatment/Interventions ADLs/Self Care Home Management;Electrical Stimulation;DME Instruction;Gait training;Stair training;Functional mobility training;Therapeutic activities;Therapeutic exercise;Balance training;Neuromuscular re-education;Manual techniques;Orthotic Fit/Training;Patient/family education;Passive range of motion;Aquatic Therapy    PT Next Visit Plan LTGs due 11/20/19; gait with reciprocal arm swing; continue to work on balance reactions and left LE strengthening with emphasis on left LE weight bearing    PT Home Exercise Plan Access Code: [email or web address]    Consulted and Agree with Plan of Care Patient           Patient will benefit from skilled therapeutic intervention in order to improve the following deficits and impairments:  Abnormal gait, Decreased coordination, Difficulty walking, Impaired tone, Impaired UE functional use, Decreased activity tolerance, Decreased balance, Decreased mobility, Decreased strength, Postural dysfunction  Visit Diagnosis: Hemiplegia and hemiparesis following cerebral infarction affecting left non-dominant side (HCC)  Muscle weakness (generalized)  Other abnormalities of gait and mobility  Unsteadiness on feet     Problem List Patient Active Problem List   Diagnosis Date Noted  . Vascular headache   . Other chronic pain   . Uncontrolled type 2 diabetes mellitus with hyperglycemia ([facility])   .  Labile blood glucose   . Panic disorder with agoraphobia and moderate panic attacks   . Major depressive disorder, recurrent episode, moderate ([facility])   . MCI (mild cognitive impairment) with memory loss   . Acute ischemic right middle cerebral artery (MCA) stroke ([facility]) 03/09/2019  . Left hemiparesis ([facility])   . Bradycardia   . Tachypnea    . SIRS (systemic inflammatory response syndrome) (HCC)   . Hypokalemia   . Diabetes mellitus type 2 in obese ([facility])   . Tobacco abuse   . History of breast cancer   . Cognitive deficits   . Dysphagia, post-stroke   . Stroke (cerebrum) ([facility]) 03/02/2019  . Middle cerebral artery embolism, right 03/02/2019  . Chest pain 12/19/2015  . Numbness 12/19/2015  . Femoral hernia 03/24/2014  . Dyslipidemia 09/10/2011  . BRCA1 positive 09/10/2011  . Breast cancer ([facility]) 09/10/2011  . H/O gastric bypass; 05/14/11([facility]) 09/10/2011  . Essential hypertension 10/28/2008  . DEPRESSION/ANXIETY 02/19/2007  . CARPAL TUNNEL SYNDROME, RIGHT 02/19/2007  . ALLERGIC RHINITIS 02/19/2007  . GERD 02/19/2007  . IRRITABLE BOWEL SYNDROME 02/19/2007  . HERPES GENITALIS 12/30/2006  . Diabetes ([place]) 12/30/2006  . OSTEOPENIA 12/30/2006  . ANOMALY, CONGENITAL, NERVOUS SYSTEM NEC 12/30/2006    Willow Ora, PTA, [facility] [address] [place], [place] [address] [phone] 11/13/19, 5:04 PM   Name: Ann Lopez MRN: [id number] Date of Birth: 1971/01/10

## 2019-11-16 ENCOUNTER — Other Ambulatory Visit: Payer: Self-pay

## 2019-11-16 ENCOUNTER — Ambulatory Visit: Payer: Medicare Other | Admitting: Occupational Therapy

## 2019-11-16 ENCOUNTER — Ambulatory Visit: Payer: Medicare Other | Admitting: Physical Therapy

## 2019-11-16 ENCOUNTER — Encounter: Payer: Self-pay | Admitting: Occupational Therapy

## 2019-11-16 DIAGNOSIS — M25512 Pain in left shoulder: Secondary | ICD-10-CM

## 2019-11-16 DIAGNOSIS — R293 Abnormal posture: Secondary | ICD-10-CM

## 2019-11-16 DIAGNOSIS — R29898 Other symptoms and signs involving the musculoskeletal system: Secondary | ICD-10-CM

## 2019-11-16 DIAGNOSIS — R29818 Other symptoms and signs involving the nervous system: Secondary | ICD-10-CM

## 2019-11-16 DIAGNOSIS — I69354 Hemiplegia and hemiparesis following cerebral infarction affecting left non-dominant side: Secondary | ICD-10-CM | POA: Diagnosis not present

## 2019-11-16 DIAGNOSIS — R278 Other lack of coordination: Secondary | ICD-10-CM

## 2019-11-16 DIAGNOSIS — M6281 Muscle weakness (generalized): Secondary | ICD-10-CM

## 2019-11-16 NOTE — Therapy (Signed)
[facility] [address] [place], [place], [address] Phone: [phone]   Fax:  [phone]  Occupational Therapy Treatment  Patient Details  Name: Ann Lopez MRN: [id number] Date of Birth: 07-May-1970 Referring Provider (OT): Dr. Leeroy Cha   Encounter Date: 11/16/2019   OT End of Session - 11/16/19 1800    Visit Number 12    Number of Visits 25    Date for OT Re-Evaluation 11/24/19    Authorization Type [employer] Medicare/ Medicaid, covered 100% (medicare guidelines)    Authorization Time Period cert. date 09/25/19-12/24/19    Authorization - Number of Visits 12    Progress Note Due on Visit 20    OT Start Time 1700    OT Stop Time 1747    OT Time Calculation (min) 47 min    Activity Tolerance Patient tolerated treatment well    Behavior During Therapy WFL for tasks assessed/performed           Past Medical History:  Diagnosis Date  . Allergy   . Anxiety   . Asthma   . Back pain   . Breast cancer ([place])   . Cancer ([place])   . Depression   . Diabetes mellitus   . Headache   . Hyperlipemia   . Osteopenia   . Osteoporosis    osteopenia  . Stroke [facility])     Past Surgical History:  Procedure Laterality Date  . ABDOMINAL HYSTERECTOMY     B oophorectomy for BRCA1 gene  . BREAST SURGERY    . CESAREAN SECTION    . INCISION AND DRAINAGE ABSCESS Left 11/14/2012   Procedure: INCISION AND DRAINAGE ABSCESS LEFT GROIN;  Surgeon: Jamesetta So, MD;  Location: [facility];  Service: General;  Laterality: Left;  . IR ANGIO VERTEBRAL SEL SUBCLAVIAN INNOMINATE UNI R MOD SED  03/02/2019  . IR CT HEAD LTD  03/02/2019  . IR INTRAVSC STENT CERV CAROTID W/O EMB-PROT MOD SED INC ANGIO  03/02/2019  . IR PERCUTANEOUS ART THROMBECTOMY/INFUSION INTRACRANIAL INC DIAG ANGIO  03/02/2019  . LYMPHADENECTOMY    . MASTECTOMY Bilateral   . RADIOLOGY WITH ANESTHESIA N/A 03/02/2019   Procedure: IR WITH ANESTHESIA;  Surgeon: Luanne Bras, MD;  Location: [facility];  Service: Radiology;  Laterality: N/A;    There were no vitals filed for this visit.   Subjective Assessment - 11/16/19 1702    Subjective  i have not really had any pain.    Currently in Pain? No/denies    Pain Score 0-No pain                        OT Treatments/Exercises (OP) - 11/16/19 0001      Modalities   Modalities Ultrasound      Ultrasound   Ultrasound Location left deltoid/bicep    Ultrasound Parameters 44mz, continuous, 12 min, o0.8 w/cm2    Ultrasound Goals Edema;Pain      Manual Therapy   Manual Therapy Soft tissue mobilization    Soft tissue mobilization Posterior superior scapula, bicep/deltoid region     Passive ROM Gentle passive range to increase external rotation of L Glenohumeral joint.  Used deep breathing and head tip toward left side to help enghance further stretch of joint capsule.                    OT Education - 11/16/19 1800    Education Details gentle doorframe stretch to begin  to stretch external rotation    Person(s) Educated Patient    Methods Explanation;Demonstration    Comprehension Need further instruction            OT Short Term Goals - 11/11/19 1447      OT SHORT TERM GOAL #1   Title Pt will be independent with initial HEP.--check STGs 11/13/19 (extended due to scheduling)    Status Achieved      OT SHORT TERM GOAL #2   Title Pt will demo at least 70* L shoulder flexion for functional reaching without pain.    Status Achieved      OT SHORT TERM GOAL #3   Title Pt will verbalize understanding of proper positioning of LUE for decr pain.    Status Achieved      OT SHORT TERM GOAL #4   Title Pt will be able to use LUE in simple light home maintenance tasks as non-dominant assist at least 50% of the time with pain less than 3/10 (wiping table, folding clothes, sweeping).    Status Achieved      OT SHORT TERM GOAL #5   Title Pt will demo improved LUE functional reaching for  ADLs as shown by improving score on box and blocks test by at least 6.    Status Achieved   36 blocks     OT SHORT TERM GOAL #6   Title Pt will verbalize understanding of adaptive strategies/AE to incr independence with cooking tasks.    Status Achieved             OT Long Term Goals - 09/25/19 1820      OT LONG TERM GOAL #1   Title Pt will be independent with updated HEP for LUE--check LTGs 11/24/19    Time 8    Period Weeks    Status New      OT LONG TERM GOAL #2   Title Pt will demo at least 90* L shoulder flex without pain for functional reaching.    Baseline 40*    Time 8    Period Weeks    Status New      OT LONG TERM GOAL #3   Title Pt will be able to use LUE as nondominant assist for functional tasks at least 75% of the time for light ADLs and IADLs with pain consistently less than or equal to 2/10.    Time 8    Period Weeks    Status New      OT LONG TERM GOAL #4   Title Pt will improve L grip strength by at least 8lbs to assist in opening containers/home maintenance tasks.    Baseline 42.7lbs    Time 8    Period Weeks    Status New      OT LONG TERM GOAL #5   Title Pt will improve LUE coordination as shown by improving time on 9-hole peg test by at least 8sec.    Baseline 40.81sec    Time 8    Period Weeks    Status New      OT LONG TERM GOAL #6   Title Pt will improve LUE functional reaching for ADLs as shown by improving score on box and blocks test by at least 10.    Baseline 28    Time 8    Period Weeks    Status New                 Plan - 11/16/19  1801    Clinical Impression Statement Patient with significantly less shoulder pian, now working toward increased range of motion, and functional use of LUE    OT Duration 8 weeks    OT Treatment/Interventions Self-care/ADL training;Moist Heat;DME and/or AE instruction;Therapeutic activities;Aquatic Therapy;Therapeutic exercise;Ultrasound;Passive range of motion;Functional Mobility  Training;Neuromuscular education;Cryotherapy;Electrical Stimulation;Manual Therapy;Patient/family education    Plan manual therapy prn to address L shoulder pain and neuro re-ed (L shoulder/scapular/trunk), ultrasound    Consulted and Agree with Plan of Care Patient           Patient will benefit from skilled therapeutic intervention in order to improve the following deficits and impairments:           Visit Diagnosis: Hemiplegia and hemiparesis following cerebral infarction affecting left non-dominant side (HCC)  Muscle weakness (generalized)  Abnormal posture  Acute pain of left shoulder  Other symptoms and signs involving the nervous system  Other symptoms and signs involving the musculoskeletal system  Other lack of coordination    Problem List Patient Active Problem List   Diagnosis Date Noted  . Vascular headache   . Other chronic pain   . Uncontrolled type 2 diabetes mellitus with hyperglycemia ([place])   . Labile blood glucose   . Panic disorder with agoraphobia and moderate panic attacks   . Major depressive disorder, recurrent episode, moderate ([place])   . MCI (mild cognitive impairment) with memory loss   . Acute ischemic right middle cerebral artery (MCA) stroke ([place]) 03/09/2019  . Left hemiparesis ([place])   . Bradycardia   . Tachypnea   . SIRS (systemic inflammatory response syndrome) (HCC)   . Hypokalemia   . Diabetes mellitus type 2 in obese ([place])   . Tobacco abuse   . History of breast cancer   . Cognitive deficits   . Dysphagia, post-stroke   . Stroke (cerebrum) ([place]) 03/02/2019  . Middle cerebral artery embolism, right 03/02/2019  . Chest pain 12/19/2015  . Numbness 12/19/2015  . Femoral hernia 03/24/2014  . Dyslipidemia 09/10/2011  . BRCA1 positive 09/10/2011  . Breast cancer ([place]) 09/10/2011  . H/O gastric bypass; 05/14/11([facility]) 09/10/2011  . Essential hypertension 10/28/2008  . DEPRESSION/ANXIETY 02/19/2007  . CARPAL TUNNEL SYNDROME, RIGHT  02/19/2007  . ALLERGIC RHINITIS 02/19/2007  . GERD 02/19/2007  . IRRITABLE BOWEL SYNDROME 02/19/2007  . HERPES GENITALIS 12/30/2006  . Diabetes ([place]) 12/30/2006  . OSTEOPENIA 12/30/2006  . ANOMALY, CONGENITAL, NERVOUS SYSTEM NEC 12/30/2006    Mariah Milling, OTR/L 11/16/2019, 6:03 PM  [facility] [address] [place], [place], [address] Phone: [phone]   Fax:  [phone]  Name: KISHIA SHACKETT MRN: [id number] Date of Birth: Oct 16, 1970

## 2019-11-17 ENCOUNTER — Ambulatory Visit: Payer: Medicare Other | Admitting: Physical Therapy

## 2019-11-17 ENCOUNTER — Ambulatory Visit: Payer: Medicare Other | Admitting: Occupational Therapy

## 2019-11-18 ENCOUNTER — Ambulatory Visit (INDEPENDENT_AMBULATORY_CARE_PROVIDER_SITE_OTHER): Payer: Medicare Other | Admitting: Adult Health

## 2019-11-18 ENCOUNTER — Encounter: Payer: Self-pay | Admitting: Adult Health

## 2019-11-18 ENCOUNTER — Other Ambulatory Visit: Payer: Self-pay

## 2019-11-18 VITALS — BP 106/71 | HR 81 | Ht 62.0 in | Wt 172.4 lb

## 2019-11-18 DIAGNOSIS — I6521 Occlusion and stenosis of right carotid artery: Secondary | ICD-10-CM | POA: Diagnosis not present

## 2019-11-18 DIAGNOSIS — G8114 Spastic hemiplegia affecting left nondominant side: Secondary | ICD-10-CM | POA: Diagnosis not present

## 2019-11-18 DIAGNOSIS — Z8673 Personal history of transient ischemic attack (TIA), and cerebral infarction without residual deficits: Secondary | ICD-10-CM | POA: Diagnosis not present

## 2019-11-18 DIAGNOSIS — I10 Essential (primary) hypertension: Secondary | ICD-10-CM | POA: Diagnosis not present

## 2019-11-18 DIAGNOSIS — E1169 Type 2 diabetes mellitus with other specified complication: Secondary | ICD-10-CM

## 2019-11-18 DIAGNOSIS — E785 Hyperlipidemia, unspecified: Secondary | ICD-10-CM

## 2019-11-18 DIAGNOSIS — E669 Obesity, unspecified: Secondary | ICD-10-CM

## 2019-11-18 NOTE — Progress Notes (Signed)
[facility] [address] [place]. [place] [address] [phone]       OFFICE FOLLOW-UP NOTE  [name] Date of Birth:  [birth year] Medical Record Number:  [id number]    Reason for visit: Stroke   Chief complaint: Chief Complaint  Patient presents with  . Follow-up    6 month f/u   . room 9    alone       HPI:   Today, 11/18/2019, Ann Lopez returns for stroke follow-up.  Residual deficits left sided weakness with gait impairment  Currently participating in outpatient therapy with ongoing improvement but has been having difficulties with left frozen shoulder.  Shoulder has been slowly improving.  Recently started on baclofen by PCP Participated in [facility] trial with excellent improvement of left arm strength Ambulates without assistive device and denies any recent falls Denies new or worsening stroke/TIA symptoms  Continues on aspirin and Crestor for secondary stroke prevention without side effects.  Blood pressure today 106/71.  Glucose levels stable.  Continues to follow with PCP for HTN, HLD and DM management.  Follow-up carotid ultrasound 09/18/2019 showed patent right ICA stent and recommended 19-month follow-up per Dr. Estanislado Pandy.  She remains on Brilinta with mild bruising but no bleeding -she is unsure if this needs to be continued  No further concerns    History provided for reference purposes only Initial visit 05/18/2019 Dr. Leonie Lopez: Ann Lopez is a 49 year old Caucasian lady seen for initial office follow-up visit following hospital admission for stroke in November 2020.  History is obtained from the patient, review of electronic medical records and I personally reviewed imaging films in PACS.  She has past medical history of diabetes, hyperlipidemia, smoking, breast cancer who presented on 03/02/2019 with sudden onset of left-sided weakness with neglect.  Last seen normal was unclear possibly the night prior and she was not a  TPA candidate.  On exam she had left sided weakness with right gaze preference and was taken for emergent CT scan which was unremarkable but CT perfusion showed a relatively large right-sided penumbra with right M1 occlusion.  She was taken for emergent thrombectomy and was found to have proximal right carotid stenosis which required rescue ICA stenting and there was successful revascularization of the terminal right ICA, right ACA and MCA with TICI 2C reperfusion by Dr. Estanislado Pandy.  She was kept in the intensive care unit her blood pressure is tightly controlled and she was extubated and did well.  She had left hemiparesis but gradually improved.  She was placed on aspirin and Brilinta and transferred to inpatient rehab where she stayed for a few weeks and is currently at home.  She still getting therapy at home and is about to finish occupational therapy.  She has graduated from walking with a walker and now walks with a cane which she uses mostly for outdoors and indoors she can walk without it.  She is have pain with improvement in the left leg but her left arm is still significantly weak and she has trouble with bending her fingers and fine motor skills.  She is tolerating aspirin and Brilinta with minor bruising but no major bleeding.  She has followed up with Dr. Estanislado Pandy who plans to do a follow-up carotid ultrasound in May at next visit.  She has quit smoking completely and is proud of this fact.  She states her sugars are doing much better and fasting sugars are usually in the 1 10-1 20 range last  A1c was 6.  Her lipids are also improving and she is on Crestor and her primary care physician has recently prescribed Repatha injections which she has not yet started.  She has been evaluated for sleep apnea in the past and does not have it.  She wants to start driving and is requesting a handicap parking sticker.     ROS:   14 system review of systems is positive for weakness, gait difficulty, bruising  and all other systems negative PMH:  Past Medical History:  Diagnosis Date  . Allergy   . Anxiety   . Asthma   . Back pain   . Breast cancer ([place])   . Cancer ([place])   . Depression   . Diabetes mellitus   . Headache   . Hyperlipemia   . Osteopenia   . Osteoporosis    osteopenia  . Stroke [facility])     Social History:  Social History   Socioeconomic History  . Marital status: Single    Spouse name: Not on file  . Number of children: 4  . Years of education: 42  . Highest education level: Not on file  Occupational History  . Occupation: Disabled  Tobacco Use  . Smoking status: Former Smoker    Packs/day: 0.50    Types: Cigarettes    Quit date: 03/02/2019    Years since quitting: 0.7  . Smokeless tobacco: Never Used  Substance and Sexual Activity  . Alcohol use: No    Alcohol/week: 0.0 standard drinks  . Drug use: No  . Sexual activity: Not Currently  Other Topics Concern  . Not on file  Social History Narrative   Marital status: single      Lives: at home with her four sons (12, 43, 18, 76); no grandchildren      Employment: disability for breast cancer, DDD lumbar, anxiety/panic attacks      Tobacco: 1 ppd       Alcohol: none         Right-handed.   2-3 cups caffeine daily.   Social Determinants of Health   Financial Resource Strain:   . Difficulty of Paying Living Expenses:   Food Insecurity:   . Worried About Charity fundraiser in the Last Year:   . Arboriculturist in the Last Year:   Transportation Needs:   . Film/video editor (Medical):   Marland Kitchen Lack of Transportation (Non-Medical):   Physical Activity:   . Days of Exercise per Week:   . Minutes of Exercise per Session:   Stress:   . Feeling of Stress :   Social Connections:   . Frequency of Communication with Friends and Family:   . Frequency of Social Gatherings with Friends and Family:   . Attends Religious Services:   . Active Member of Clubs or Organizations:   . Attends Theatre manager Meetings:   Marland Kitchen Marital Status:   Intimate Partner Violence:   . Fear of Current or Ex-Partner:   . Emotionally Abused:   Marland Kitchen Physically Abused:   . Sexually Abused:     Medications:   Current Outpatient Medications on File Prior to Visit  Medication Sig Dispense Refill  . albuterol (PROVENTIL HFA;VENTOLIN HFA) 108 (90 Base) MCG/ACT inhaler Inhale 1 puff into the lungs every 6 (six) hours as needed for wheezing or shortness of breath. 1 Inhaler 6  . aspirin EC 81 MG tablet Take 1 tablet (81 mg total) by mouth daily. 30 tablet 0  .  canagliflozin (INVOKANA) 300 MG TABS tablet Take 1 tablet (300 mg total) by mouth daily before breakfast. 30 tablet 0  . clonazePAM (KLONOPIN) 0.5 MG tablet Take 0.5 tablets (0.25 mg total) by mouth 2 (two) times daily as needed for anxiety. 30 tablet 0  . docusate sodium (COLACE) 100 MG capsule Take 200 mg by mouth at bedtime.    . DULoxetine (CYMBALTA) 60 MG capsule TAKE 1 CAPSULE BY MOUTH EVERY DAY (Patient taking differently: Take 60 mg by mouth daily. ) 90 capsule 1  . gabapentin (NEURONTIN) 300 MG capsule Take 300 mg by mouth at bedtime.    Marland Kitchen HUMALOG KWIKPEN 100 UNIT/ML KwikPen SMARTSIG:6 Unit(s) SUB-Q    . HYDROcodone-acetaminophen (NORCO) 10-325 MG tablet Take 1 tablet by mouth every 6 (six) hours as needed for moderate pain.     Marland Kitchen icosapent Ethyl (VASCEPA) 1 g capsule Take 2 capsules (2 g total) by mouth 2 (two) times daily. 180 capsule 3  . Insulin Glargine (LANTUS) 100 UNIT/ML Solostar Pen Inject 15 Units into the skin daily. (Patient taking differently: Inject 15 Units into the skin at bedtime. ) 15 mL 11  . Insulin Pen Needle (PEN NEEDLES) 32G X 5 MM MISC 1 application by Does not apply route at bedtime. 100 each 0  . Lancets (ONETOUCH ULTRASOFT) lancets Use as instructed; check sugar before meals and at bedtime 100 each 12  . Levocetirizine Dihydrochloride (XYZAL PO) Take by mouth at bedtime.    . montelukast (SINGULAIR) 10 MG tablet Take 10  mg by mouth at bedtime.    Marland Kitchen omeprazole (PRILOSEC) 40 MG capsule Take 40 mg by mouth daily.    . potassium chloride (KLOR-CON) 10 MEQ tablet Take 1 tablet (10 mEq total) by mouth 2 (two) times daily. (Patient taking differently: Take 10 mEq by mouth daily. ) 60 tablet 0  . temazepam (RESTORIL) 15 MG capsule Take 1 capsule (15 mg total) by mouth at bedtime as needed for sleep. 60 capsule 0  . ticagrelor (BRILINTA) 90 MG TABS tablet Take 1 tablet (90 mg total) by mouth 2 (two) times daily. 60 tablet 2  . rosuvastatin (CRESTOR) 40 MG tablet Take 1 tablet (40 mg total) by mouth daily. 90 tablet 3  . [DISCONTINUED] fluticasone (FLONASE) 50 MCG/ACT nasal spray Place 2 sprays into the nose daily. (Patient not taking: Reported on 03/10/2014) 1 g 12   No current facility-administered medications on file prior to visit.    Allergies:   Allergies  Allergen Reactions  . Azithromycin Other (See Comments)    Unknown reaction  . Morphine And Related Other (See Comments)    Unknown reaction - Can tolerate hydrocodone and dilaudid without side effects  . Nitrofurantoin Other (See Comments)    Unknown reaction  . Penicillins Hives and Itching    Has patient had a PCN reaction causing immediate rash, facial/tongue/throat swelling, SOB or lightheadedness with hypotension: Yes Has patient had a PCN reaction causing severe rash involving mucus membranes or skin necrosis: No Has patient had a PCN reaction that required hospitalization No Has patient had a PCN reaction occurring within the last 10 years: No If all of the above answers are "NO", then may proceed with Cephalosporin use.   . Sulfonamide Derivatives Hives      Today's Vitals   11/18/19 0931  BP: 106/71  Pulse: 81  Weight: 172 lb 6.4 oz (78.2 kg)  Height: 5\' 2"  (1.575 m)   Body mass index is 31.53 kg/m.   Physical  Exam General: well developed, well nourished pleasant middle-aged Caucasian lady, seated, in no evident distress Neck:  supple with no carotid or supraclavicular bruits Cardiovascular: regular rate and rhythm, no murmurs Vascular:  Normal pulses all extremities  Neurologic Exam Mental Status: Awake and fully alert.  Fluent speech and language.  Oriented to place and time. Recent and remote memory intact. Attention span, concentration and fund of knowledge appropriate. Mood and affect appropriate.  Cranial Nerves: Pupils equal, briskly reactive to light. Extraocular movements full without nystagmus. Visual fields full to confrontation. Hearing intact. Facial sensation intact. Face, tongue, palate moves normally and symmetrically.  Motor: Normal strength on the right.   Spastic left hemiparesis 4/5 with limited shoulder ROM with increased spasticity LUE.  Mild ankle dorsiflexion weakness with use of foot drop brace.   Sensory.: intact to touch ,pinprick .position and vibratory sensation.  Coordination: Rapid alternating movements normal in all extremities except slightly decreased left hand. Finger-to-nose and heel-to-shin performed accurately on right side. Gait and Station: Arises from chair without difficulty. Stance is normal.  She walks with a spastic hemiplegic gait with circumduction without use of assistive device.  Able to tandem walk with only slight difficulty.  Slight difficulty standing on left leg alone Reflexes: 1+ and symmetric. Toes downgoing.     ASSESSMENT/PLAN: 49 year old lady with right MCA infarct in November 2020 secondary to terminal right ICA, MCA and ACA occlusion status post mechanical thrombectomy and rescue right proximal ICA stent placement who is doing moderately well but does still have residual mild spastic left hemiparesis.  Vascular risk factors of diabetes, hyper lipidemia, smoking and carotid stenosis     1. Right MCA stroke:  a. Residual deficit: Spastic left hemiparesis.  Great improvement since prior visit with participation in ongoing therapy and TRANSPORT2 trial.  Left  shoulder complaints with frozen shoulder and spasticity.  Encourage initiated baclofen as prescribed by PCP. b. Continue aspirin 81 mg daily and Brilinta (ticagrelor) 90 mg bid  and Crestor for secondary stroke prevention.  c. Close PCP f/u for aggressive stroke risk factor management  2. R ICA s/p stent:  a. Follow-up carotid ultrasound patent right carotid stent.  Advised to contact Dr. Arlean Hopping office in regards to ongoing need of Brilinta as this is not indicated from a stroke standpoint 3. HTN:  a. BP goal <130/90.  b. Stable today.  c. Continue f/u with PCP 4. HLD:  a. LDL goal <70.   b. Continue Crestor.   c. F/u with PCP for management and prescribing  5. DMII:  a. A1c goal <7.0.   b. F/u with PCP   Follow-up in 6 months or call earlier if needed    I spent 30 minutes of face-to-face and non-face-to-face time with patient.  This included previsit chart review, lab review, study review, order entry, electronic health record documentation, patient education regarding history of stroke, residual deficits and improvement from prior visit, ongoing use of Brilinta post stent, importance of managing stroke risk factors and answered all questions to patient satisfaction   Frann Rider, [facility]  [facility] [address] [place], [place] [address]  Phone [phone] Fax [phone] Note: This document was prepared with digital dictation and possible smart phrase technology. Any transcriptional errors that result from this process are unintentional.

## 2019-11-18 NOTE — Patient Instructions (Signed)
Please contact Dr. Estanislado Pandy office regarding ongoing use of Brilinita and follow up recommendation  Continue to follow up with therapy for hopeful ongoing improvement and recommend use of baclofen  Continue aspirin 81 mg daily and Brilinta (ticagrelor) 90 mg bid  and Crestor  for secondary stroke prevention  Continue to follow up with PCP regarding cholesterol and blood pressure management  Maintain strict control of hypertension with blood pressure goal below 130/90, diabetes with hemoglobin A1c goal below 6.5% and cholesterol with LDL cholesterol (bad cholesterol) goal below 70 mg/dL.      Followup in the future with me in 6 months or call earlier if needed       Thank you for coming to see [place] at [facility]. I hope we have been able to provide you high quality care today.  You may receive a patient satisfaction survey over the next few weeks. We would appreciate your feedback and comments so that we may continue to improve ourselves and the health of our patients.

## 2019-11-19 ENCOUNTER — Encounter: Payer: Self-pay | Admitting: Occupational Therapy

## 2019-11-19 ENCOUNTER — Other Ambulatory Visit: Payer: Self-pay

## 2019-11-19 ENCOUNTER — Ambulatory Visit: Payer: Medicare Other | Admitting: Physical Therapy

## 2019-11-19 ENCOUNTER — Ambulatory Visit: Payer: Medicare Other | Admitting: Occupational Therapy

## 2019-11-19 DIAGNOSIS — R2681 Unsteadiness on feet: Secondary | ICD-10-CM

## 2019-11-19 DIAGNOSIS — R1031 Right lower quadrant pain: Secondary | ICD-10-CM | POA: Diagnosis not present

## 2019-11-19 DIAGNOSIS — M6281 Muscle weakness (generalized): Secondary | ICD-10-CM

## 2019-11-19 DIAGNOSIS — R2689 Other abnormalities of gait and mobility: Secondary | ICD-10-CM

## 2019-11-19 DIAGNOSIS — R293 Abnormal posture: Secondary | ICD-10-CM

## 2019-11-19 DIAGNOSIS — R29898 Other symptoms and signs involving the musculoskeletal system: Secondary | ICD-10-CM

## 2019-11-19 DIAGNOSIS — R278 Other lack of coordination: Secondary | ICD-10-CM

## 2019-11-19 DIAGNOSIS — R29818 Other symptoms and signs involving the nervous system: Secondary | ICD-10-CM

## 2019-11-19 DIAGNOSIS — A419 Sepsis, unspecified organism: Secondary | ICD-10-CM | POA: Diagnosis not present

## 2019-11-19 DIAGNOSIS — M25512 Pain in left shoulder: Secondary | ICD-10-CM

## 2019-11-19 DIAGNOSIS — I69354 Hemiplegia and hemiparesis following cerebral infarction affecting left non-dominant side: Secondary | ICD-10-CM

## 2019-11-19 NOTE — Therapy (Signed)
[facility] [address] [place], [place], [address] Phone: [phone]   Fax:  [phone]  Physical Therapy Treatment  Patient Details  Name: Ann Lopez MRN: [id number] Date of Birth: 27-Mar-1971 Referring Provider (PT): Dr. Leeroy Cha   Encounter Date: 11/19/2019   PT End of Session - 11/19/19 0946    Visit Number 23    Number of Visits 26    Date for PT Re-Evaluation 12/23/19    Authorization Type UHC Medicare/Medicaid    Authorization Time Period 09/24/2019-12/23/2019    Progress Note Due on Visit 29   Progress note completed at 11/05/2019 visit, #19   PT Start Time 0900   Pt arrives late   PT Stop Time 0929    PT Time Calculation (min) 29 min    Activity Tolerance Patient tolerated treatment well;Patient limited by lethargy   pain in R groin, being followed and worked-up by PCP   Behavior During Therapy WFL for tasks assessed/performed           Past Medical History:  Diagnosis Date   Allergy    Anxiety    Asthma    Back pain    Breast cancer ([place])    Cancer ([place])    Depression    Diabetes mellitus    Headache    Hyperlipemia    Osteopenia    Osteoporosis    osteopenia   Stroke [facility])     Past Surgical History:  Procedure Laterality Date   ABDOMINAL HYSTERECTOMY     B oophorectomy for BRCA1 gene   BREAST SURGERY     CESAREAN SECTION     INCISION AND DRAINAGE ABSCESS Left 11/14/2012   Procedure: INCISION AND DRAINAGE ABSCESS LEFT GROIN;  Surgeon: Jamesetta So, MD;  Location: [facility];  Service: General;  Laterality: Left;   IR ANGIO VERTEBRAL SEL SUBCLAVIAN INNOMINATE UNI R MOD SED  03/02/2019   IR CT HEAD LTD  03/02/2019   IR INTRAVSC STENT CERV CAROTID W/O EMB-PROT MOD SED INC ANGIO  03/02/2019   IR PERCUTANEOUS ART THROMBECTOMY/INFUSION INTRACRANIAL INC DIAG ANGIO  03/02/2019   LYMPHADENECTOMY     MASTECTOMY Bilateral    RADIOLOGY WITH ANESTHESIA N/A  03/02/2019   Procedure: IR WITH ANESTHESIA;  Surgeon: Luanne Bras, MD;  Location: [facility];  Service: Radiology;  Laterality: N/A;    There were no vitals filed for this visit.   Subjective Assessment - 11/19/19 0903    Subjective Cancelled earlier this week and just generally don't feel great.  Feel like a pulled muscle in my R groin, but my PCP feels it may be a lymph node.    Pertinent History HUT:MLYYTKPT, hyperlipidemia, smoking, breast cancer, R carotid stenosis requiring emergent thrombectomy    Patient Stated Goals Pt wants all the movement back in L side.    Currently in Pain? Yes    Pain Score 5     Pain Location Groin    Pain Orientation Right    Pain Descriptors / Indicators Aching    Pain Type Acute pain    Pain Onset In the past 7 days    Pain Frequency Constant    Aggravating Factors  unsure    Pain Relieving Factors tries Aleve, heating pad              [facility] PT Assessment - 11/19/19 0001      Functional Gait  Assessment   Gait assessed  Yes    Gait Level  Surface Walks 20 ft in less than 7 sec but greater than 5.5 sec, uses assistive device, slower speed, mild gait deviations, or deviates 6-10 in outside of the 12 in walkway width.    Change in Gait Speed Able to smoothly change walking speed without loss of balance or gait deviation. Deviate no more than 6 in outside of the 12 in walkway width.    Gait with Horizontal Head Turns Performs head turns smoothly with no change in gait. Deviates no more than 6 in outside 12 in walkway width    Gait with Vertical Head Turns Performs head turns with no change in gait. Deviates no more than 6 in outside 12 in walkway width.    Gait and Pivot Turn Pivot turns safely within 3 sec and stops quickly with no loss of balance.    Step Over Obstacle Is able to step over one shoe box (4.5 in total height) without changing gait speed. No evidence of imbalance.    Gait with Narrow Base of Support Ambulates 7-9 steps.    Gait  with Eyes Closed Walks 20 ft, uses assistive device, slower speed, mild gait deviations, deviates 6-10 in outside 12 in walkway width. Ambulates 20 ft in less than 9 sec but greater than 7 sec.   7.94   Ambulating Backwards Walks 20 ft, uses assistive device, slower speed, mild gait deviations, deviates 6-10 in outside 12 in walkway width.   18.5   Steps Alternating feet, must use rail.    Total Score 24                         [facility] Adult PT Treatment/Exercise - 11/19/19 0001      Transfers   Transfers Sit to Stand;Stand to Sit    Sit to Stand 6: Modified independent (Device/Increase time);Without upper extremity assist;From chair/3-in-1    Five time sit to stand comments  10.79 sec no UE support    Stand to Sit 6: Modified independent (Device/Increase time);Without upper extremity assist;To chair/3-in-1      Ambulation/Gait   Ambulation/Gait Yes    Ambulation/Gait Assistance 5: Supervision    Ambulation/Gait Assistance Details Gym distances for gait testing activities    Assistive device None    Gait Pattern Step-through pattern;Decreased stance time - left;Decreased arm swing - left;Trendelenburg;Lateral trunk lean to left    Ambulation Surface Level;Indoor    Gait velocity 10.03 sec = 3.27 ft/sec      Self-Care   Self-Care Other Self-Care Comments    Other Self-Care Comments  Discussed pt's POC and progress towards goals; pt undergoing tests with PCP, as she is having R groin pain (unchanged during PT activities); she has also started Baclofen (11/18/2019 pm first dose) and overslept/lethargic today.  Discussed POC/recert in regards to aquatic therapy (PT versus OT).  Pt feels she can do aquatic activties at [facility] pool that she has learned in PT, but she is interested in potential for aquatic OT.                    PT Education - 11/19/19 0945    Education Details Discussed POC, progress towards goals and recert at end of this week    Person(s) Educated Patient     Methods Explanation    Comprehension Verbalized understanding            PT Short Term Goals - 10/21/19 0853      PT SHORT TERM GOAL #  1   Title Pt will be independent with HEP for improved balance, strength, gait.  TARGET 5 weeks for all LTGs: 10/23/2019    Baseline 10/21/19: pt. verbalizes independence with HEP.    Time 5    Period Weeks    Status Achieved      PT SHORT TERM GOAL #2   Title Pt will improve 5x sit<>stand score to less than or equal to 12 seconds for improved functional strength.    Baseline 10/21/19: 12.31 seconds to complete 5x sit<>stand.    Time 5    Period Weeks    Status Partially Met      PT SHORT TERM GOAL #3   Title Pt will improve FGA score to at least 20/30 for decreased fall risk.    Baseline 10/21/19: pt. scored a 24/30 on the FGA today showing a decrease in fall risk.    Time 5    Period Weeks    Status Achieved      PT SHORT TERM GOAL #4   Title Pt will verbalize understanding of fall prevention in home environment.    Baseline 10/21/19: pt. verbalizes fall prevention techniques like using a rubber mat in the shower, and handrails when available.    Time 5    Period Weeks    Status Achieved             PT Long Term Goals - 11/19/19 0909      PT LONG TERM GOAL #1   Title Pt will be independent with progression of HEP for strength, balance, and gait.  TARGET 11/20/2019    Time 9    Period Weeks    Status New      PT LONG TERM GOAL #2   Title Pt will improve 5x sit<>stand score to less than or equal to 10 seconds for improved functional strength.    Baseline 10.79 11/19/2019    Time 9    Period Weeks    Status Partially Met      PT LONG TERM GOAL #3   Title Pt will improve Functional Gait Assessment score to at least 22/30 for decreased fall risk.    Baseline 24/30 11/19/2019    Time 9    Period Weeks    Status Achieved      PT LONG TERM GOAL #4   Title Pt will improve gait velocity to at least 3 ft/sec for improved gait  efficiency for community ambulation.    Baseline 3.27 ft/sec    Time 9    Period Weeks    Status Achieved      PT LONG TERM GOAL #5   Title Pt will ambulate at least 1000 ft, outdoor and indoor surfaces, appropriate device as needed, modified independently, for improved community gait.    Time 9    Period Weeks    Status New      PT LONG TERM GOAL #6   Title FOTO score to improve by at least 10 % for improved overall reported functional mobility.    Time 9    Period Weeks    Status New                 Plan - 11/19/19 0947    Clinical Impression Statement Began assessing LTGs this visit, with pt meeting LTG 3 for improved FGA and LTG 4 for improved gait velocity.  LTG partially met for 5x sit<>stand.  Pt has demonstrated continued improvement with functional mobility measures; however, she  continues to demonstrate "limp" in LLE, due to weakness in L trunk and hip.  While this has improved during course of therapy, it still presents with longer distance walking and muscle fatigue.  Pt will benefit from further skilled PT to address strength, balance, gait training for improved functional mobility.    Personal Factors and Comorbidities Comorbidity 3+    Comorbidities PMH:  diabetes, hyperlipidemia, smoking, breast cancer, R carotid stenosis requiring emergent thrombectomy, anxiety depression    Examination-Activity Limitations Locomotion Level;Transfers;Reach Overhead;Stand;Stairs;Lift;Dressing    Examination-Participation Restrictions Community Activity;Other   caring for grandchild   Stability/Clinical Decision Making Evolving/Moderate complexity    Rehab Potential Good    PT Frequency Other (comment)   1x/wk for 1 week, then 3x/wk for 8 weeks   PT Duration Other (comment)   9 weeks   PT Treatment/Interventions ADLs/Self Care Home Management;Electrical Stimulation;DME Instruction;Gait training;Stair training;Functional mobility training;Therapeutic activities;Therapeutic  exercise;Balance training;Neuromuscular re-education;Manual techniques;Orthotic Fit/Training;Patient/family education;Passive range of motion;Aquatic Therapy    PT Next Visit Plan Check remaining LTGs, including HEP check (pt verbalizes HEP is easy at 3 sets of 10 reps for most, so may need to revise/upgrade); LLE and trunk strenghtening, balance-try compliant surfaces; will need to recert at end of this week and pt is agreeable to going to 2x/wk    PT Home Exercise Plan Access Code: [email or web address]    Consulted and Agree with Plan of Care Patient           Patient will benefit from skilled therapeutic intervention in order to improve the following deficits and impairments:  Abnormal gait, Decreased coordination, Difficulty walking, Impaired tone, Impaired UE functional use, Decreased activity tolerance, Decreased balance, Decreased mobility, Decreased strength, Postural dysfunction  Visit Diagnosis: Other abnormalities of gait and mobility  Muscle weakness (generalized)  Abnormal posture  Unsteadiness on feet     Problem List Patient Active Problem List   Diagnosis Date Noted   Vascular headache    Other chronic pain    Uncontrolled type 2 diabetes mellitus with hyperglycemia ([place])    Labile blood glucose    Panic disorder with agoraphobia and moderate panic attacks    Major depressive disorder, recurrent episode, moderate (HCC)    MCI (mild cognitive impairment) with memory loss    Acute ischemic right middle cerebral artery (MCA) stroke (HCC) 03/09/2019   Left hemiparesis (HCC)    Bradycardia    Tachypnea    SIRS (systemic inflammatory response syndrome) (HCC)    Hypokalemia    Diabetes mellitus type 2 in obese (HCC)    Tobacco abuse    History of breast cancer    Cognitive deficits    Dysphagia, post-stroke    Stroke (cerebrum) ([place]) 03/02/2019   Middle cerebral artery embolism, right 03/02/2019   Chest pain 12/19/2015   Numbness 12/19/2015    Femoral hernia 03/24/2014   Dyslipidemia 09/10/2011   BRCA1 positive 09/10/2011   Breast cancer ([place]) 09/10/2011   H/O gastric bypass; 05/14/11([facility]) 09/10/2011   Essential hypertension 10/28/2008   DEPRESSION/ANXIETY 02/19/2007   CARPAL TUNNEL SYNDROME, RIGHT 02/19/2007   ALLERGIC RHINITIS 02/19/2007   GERD 02/19/2007   IRRITABLE BOWEL SYNDROME 02/19/2007   HERPES GENITALIS 12/30/2006   Diabetes ([place]) 12/30/2006   OSTEOPENIA 12/30/2006   ANOMALY, CONGENITAL, NERVOUS SYSTEM NEC 12/30/2006    Bertie Simien W. 11/19/2019, 9:52 AM  Frazier Butt., PT   [facility] [address] [place], [place], [address] Phone: [phone]   Fax:  [phone]  Name: Ann Swartzentruber  Lopez MRN: [id number] Date of Birth: 1970-09-30

## 2019-11-19 NOTE — Therapy (Signed)
[facility] [address] [place], [place], [address] Phone: [phone]   Fax:  [phone]  Occupational Therapy Treatment  Patient Details  Name: Ann Lopez MRN: [id number] Date of Birth: [birth year] Referring Provider (OT): Dr. Leeroy Cha   Encounter Date: 11/19/2019   OT End of Session - 11/19/19 1203    Visit Number 13    Number of Visits 25    Date for OT Re-Evaluation 11/24/19    Authorization Type UHC Medicare/ Medicaid, covered 100% (medicare guidelines)    Authorization Time Period cert. date 09/25/19-12/24/19    Authorization - Number of Visits 13    Progress Note Due on Visit 20    OT Start Time 0935    OT Stop Time 1018    OT Time Calculation (min) 43 min    Activity Tolerance Patient tolerated treatment well    Behavior During Therapy WFL for tasks assessed/performed           Past Medical History:  Diagnosis Date  . Allergy   . Anxiety   . Asthma   . Back pain   . Breast cancer ([place])   . Cancer ([place])   . Depression   . Diabetes mellitus   . Headache   . Hyperlipemia   . Osteopenia   . Osteoporosis    osteopenia  . Stroke [facility])     Past Surgical History:  Procedure Laterality Date  . ABDOMINAL HYSTERECTOMY     B oophorectomy for BRCA1 gene  . BREAST SURGERY    . CESAREAN SECTION    . INCISION AND DRAINAGE ABSCESS Left 11/14/2012   Procedure: INCISION AND DRAINAGE ABSCESS LEFT GROIN;  Surgeon: Jamesetta So, MD;  Location: [facility];  Service: General;  Laterality: Left;  . IR ANGIO VERTEBRAL SEL SUBCLAVIAN INNOMINATE UNI R MOD SED  03/02/2019  . IR CT HEAD LTD  03/02/2019  . IR INTRAVSC STENT CERV CAROTID W/O EMB-PROT MOD SED INC ANGIO  03/02/2019  . IR PERCUTANEOUS ART THROMBECTOMY/INFUSION INTRACRANIAL INC DIAG ANGIO  03/02/2019  . LYMPHADENECTOMY    . MASTECTOMY Bilateral   . RADIOLOGY WITH ANESTHESIA N/A 03/02/2019   Procedure: IR WITH ANESTHESIA;  Surgeon: Luanne Bras, MD;  Location: [facility];  Service: Radiology;  Laterality: N/A;    There were no vitals filed for this visit.   Subjective Assessment - 11/19/19 1201    Subjective  Pt reports mild pain inconsistently, but none currently    Pertinent History R MCA  stroke with L shoulder pain.  PMH:  diabetes, anxiety, depression hyperlipidemia, smoking, breast cancer s/p bilateral mastectomy followed by [facility] in 2009, R carotid stenosis requiring emergent thrombectomy    Limitations fall risk, pain    Patient Stated Goals improve L shoudler pain and ROM, be able to care for grandchild alone    Currently in Pain? No/denies            Ultrasound to anterior shoulder and upper trap area x 2mn 146m, 1.0wts/cm2 and 50% pulsed with no adverse reactions for pain/stiffness.   Supine, gentle joint mobs/soft tissue mobs to L shoulder/scapula, upper traps, and biceps due to tightness/pain and trigger points.    Supine closed-chain shoulder flex and ER with min facilitation for improved positioning in pain-free range with min facilitation for improved positioning and scapular retraction.  Reviewed HEP and pt to emphasis ER.    Sitting, attempted AAROM elbow ext/shoulder flex at mid-range followed by attempted abduction; however, discontinued  abduction due to pain.    Kinesiotape applied to L shoulder (correction piece for ER/improved positioning)  Education provided regarding spasticity LUE.  Discussed/recommended that pt call and schedule appt with Physiatrist to further discuss spasticity management/pain LUE.  Pt did start muscle relaxer last night but ?made her sleepy.  Discussed benefits of medical management in addition to therapy.            OT Short Term Goals - 11/19/19 1207      OT SHORT TERM GOAL #1   Title Pt will be independent with initial HEP.--check STGs 11/13/19 (extended due to scheduling)    Time 4    Period Weeks    Status Achieved      OT SHORT TERM GOAL #2   Title Pt will  demo at least 70* L shoulder flexion for functional reaching without pain.    Baseline 40*    Time 4    Period Weeks    Status Achieved   10/29/19:  approx 90*     OT SHORT TERM GOAL #3   Title Pt will verbalize understanding of proper positioning of LUE for decr pain.    Time 4    Period Weeks    Status Achieved      OT SHORT TERM GOAL #4   Title Pt will be able to use LUE in simple light home maintenance tasks as non-dominant assist at least 50% of the time with pain less than 3/10 (wiping table, folding clothes, sweeping).    Time 4    Period Weeks    Status Achieved      OT SHORT TERM GOAL #5   Title Pt will demo improved LUE functional reaching for ADLs as shown by improving score on box and blocks test by at least 6.    Baseline 28 blocks    Time 4    Period Weeks    Status Achieved   36     OT SHORT TERM GOAL #6   Title Pt will verbalize understanding of adaptive strategies/AE to incr independence with cooking tasks.    Time 4    Period Weeks    Status Achieved             OT Long Term Goals - 09/25/19 1820      OT LONG TERM GOAL #1   Title Pt will be independent with updated HEP for LUE--check LTGs 11/24/19    Time 8    Period Weeks    Status New      OT LONG TERM GOAL #2   Title Pt will demo at least 90* L shoulder flex without pain for functional reaching.    Baseline 40*    Time 8    Period Weeks    Status New      OT LONG TERM GOAL #3   Title Pt will be able to use LUE as nondominant assist for functional tasks at least 75% of the time for light ADLs and IADLs with pain consistently less than or equal to 2/10.    Time 8    Period Weeks    Status New      OT LONG TERM GOAL #4   Title Pt will improve L grip strength by at least 8lbs to assist in opening containers/home maintenance tasks.    Baseline 42.7lbs    Time 8    Period Weeks    Status New      OT LONG TERM GOAL #5  Title Pt will improve LUE coordination as shown by improving time on  9-hole peg test by at least 8sec.    Baseline 40.81sec    Time 8    Period Weeks    Status New      OT LONG TERM GOAL #6   Title Pt will improve LUE functional reaching for ADLs as shown by improving score on box and blocks test by at least 10.    Baseline 28    Time 8    Period Weeks    Status New                 Plan - 11/19/19 1206    Clinical Impression Statement Pt continues to demo mild inconsistent pain, but is progressing towards goals.    OT Occupational Profile and History Detailed Assessment- Review of Records and additional review of physical, cognitive, psychosocial history related to current functional performance    Occupational performance deficits (Please refer to evaluation for details): IADL's;ADL's;Leisure    Body Structure / Function / Physical Skills ADL;Balance;IADL;ROM;Strength;Tone;FMC;Coordination;UE functional use;Decreased knowledge of precautions;GMC;Decreased knowledge of use of DME;Pain    Rehab Potential Good    Clinical Decision Making Several treatment options, min-mod task modification necessary    Comorbidities Affecting Occupational Performance: May have comorbidities impacting occupational performance    Modification or Assistance to Complete Evaluation  Min-Moderate modification of tasks or assist with assess necessary to complete eval    OT Frequency --   eval + 3x week for 8 weeks (modified due to scheduling)   OT Treatment/Interventions Self-care/ADL training;Moist Heat;DME and/or AE instruction;Therapeutic activities;Aquatic Therapy;Therapeutic exercise;Ultrasound;Passive range of motion;Functional Mobility Training;Neuromuscular education;Cryotherapy;Electrical Stimulation;Manual Therapy;Patient/family education    Plan ultrasound, neuro re-ed, manual therapy prn.    Consulted and Agree with Plan of Care Patient           Patient will benefit from skilled therapeutic intervention in order to improve the following deficits and  impairments:   Body Structure / Function / Physical Skills: ADL, Balance, IADL, ROM, Strength, Tone, FMC, Coordination, UE functional use, Decreased knowledge of precautions, GMC, Decreased knowledge of use of DME, Pain       Visit Diagnosis: Hemiplegia and hemiparesis following cerebral infarction affecting left non-dominant side (HCC)  Abnormal posture  Acute pain of left shoulder  Other symptoms and signs involving the nervous system  Other symptoms and signs involving the musculoskeletal system  Other lack of coordination    Problem List Patient Active Problem List   Diagnosis Date Noted  . Vascular headache   . Other chronic pain   . Uncontrolled type 2 diabetes mellitus with hyperglycemia ([place])   . Labile blood glucose   . Panic disorder with agoraphobia and moderate panic attacks   . Major depressive disorder, recurrent episode, moderate ([place])   . MCI (mild cognitive impairment) with memory loss   . Acute ischemic right middle cerebral artery (MCA) stroke ([place]) 03/09/2019  . Left hemiparesis ([place])   . Bradycardia   . Tachypnea   . SIRS (systemic inflammatory response syndrome) (HCC)   . Hypokalemia   . Diabetes mellitus type 2 in obese ([place])   . Tobacco abuse   . History of breast cancer   . Cognitive deficits   . Dysphagia, post-stroke   . Stroke (cerebrum) ([place]) 03/02/2019  . Middle cerebral artery embolism, right 03/02/2019  . Chest pain 12/19/2015  . Numbness 12/19/2015  . Femoral hernia 03/24/2014  . Dyslipidemia 09/10/2011  . BRCA1 positive 09/10/2011  .  Breast cancer ([place]) 09/10/2011  . H/O gastric bypass; 05/14/11([facility]) 09/10/2011  . Essential hypertension 10/28/2008  . DEPRESSION/ANXIETY 02/19/2007  . CARPAL TUNNEL SYNDROME, RIGHT 02/19/2007  . ALLERGIC RHINITIS 02/19/2007  . GERD 02/19/2007  . IRRITABLE BOWEL SYNDROME 02/19/2007  . HERPES GENITALIS 12/30/2006  . Diabetes ([place]) 12/30/2006  . OSTEOPENIA 12/30/2006  . ANOMALY, CONGENITAL,  NERVOUS SYSTEM NEC 12/30/2006    [facility] 11/19/2019, 12:09 PM  [facility] [address] [place], [place], [address] Phone: [phone]   Fax:  [phone]  Name: Ann Lopez MRN: [id number] Date of Birth: 1970-06-11   Vianne Bulls, OTR/L [facility] [address] [place], [place]  [address] [phone] phone [phone] 11/19/19 12:09 PM

## 2019-11-19 NOTE — Progress Notes (Signed)
I agree with the above plan 

## 2019-11-20 ENCOUNTER — Ambulatory Visit: Payer: Medicare Other | Admitting: Physical Therapy

## 2019-11-20 ENCOUNTER — Ambulatory Visit: Payer: Medicare Other | Admitting: Occupational Therapy

## 2019-11-20 ENCOUNTER — Other Ambulatory Visit: Payer: Self-pay

## 2019-11-20 ENCOUNTER — Emergency Department (HOSPITAL_COMMUNITY): Payer: Medicare Other

## 2019-11-20 ENCOUNTER — Inpatient Hospital Stay (HOSPITAL_COMMUNITY)
Admission: EM | Admit: 2019-11-20 | Discharge: 2019-11-24 | DRG: 854 | Disposition: A | Discharge status: Home / Self Care | Payer: Medicare Other | Attending: Internal Medicine | Admitting: Internal Medicine

## 2019-11-20 ENCOUNTER — Encounter (HOSPITAL_COMMUNITY): Payer: Self-pay

## 2019-11-20 DIAGNOSIS — E1165 Type 2 diabetes mellitus with hyperglycemia: Secondary | ICD-10-CM | POA: Diagnosis present

## 2019-11-20 DIAGNOSIS — I889 Nonspecific lymphadenitis, unspecified: Secondary | ICD-10-CM | POA: Diagnosis present

## 2019-11-20 DIAGNOSIS — A419 Sepsis, unspecified organism: Principal | ICD-10-CM | POA: Diagnosis present

## 2019-11-20 DIAGNOSIS — I959 Hypotension, unspecified: Secondary | ICD-10-CM | POA: Diagnosis not present

## 2019-11-20 DIAGNOSIS — Z803 Family history of malignant neoplasm of breast: Secondary | ICD-10-CM | POA: Diagnosis not present

## 2019-11-20 DIAGNOSIS — E669 Obesity, unspecified: Secondary | ICD-10-CM | POA: Diagnosis present

## 2019-11-20 DIAGNOSIS — L03314 Cellulitis of groin: Secondary | ICD-10-CM | POA: Diagnosis present

## 2019-11-20 DIAGNOSIS — Z8249 Family history of ischemic heart disease and other diseases of the circulatory system: Secondary | ICD-10-CM | POA: Diagnosis not present

## 2019-11-20 DIAGNOSIS — Z9013 Acquired absence of bilateral breasts and nipples: Secondary | ICD-10-CM | POA: Diagnosis not present

## 2019-11-20 DIAGNOSIS — Z6831 Body mass index (BMI) 31.0-31.9, adult: Secondary | ICD-10-CM

## 2019-11-20 DIAGNOSIS — Z87891 Personal history of nicotine dependence: Secondary | ICD-10-CM

## 2019-11-20 DIAGNOSIS — E785 Hyperlipidemia, unspecified: Secondary | ICD-10-CM | POA: Diagnosis present

## 2019-11-20 DIAGNOSIS — L0390 Cellulitis, unspecified: Secondary | ICD-10-CM | POA: Diagnosis not present

## 2019-11-20 DIAGNOSIS — R1031 Right lower quadrant pain: Secondary | ICD-10-CM

## 2019-11-20 DIAGNOSIS — Z888 Allergy status to other drugs, medicaments and biological substances status: Secondary | ICD-10-CM

## 2019-11-20 DIAGNOSIS — I10 Essential (primary) hypertension: Secondary | ICD-10-CM | POA: Diagnosis present

## 2019-11-20 DIAGNOSIS — Z20822 Contact with and (suspected) exposure to covid-19: Secondary | ICD-10-CM | POA: Diagnosis present

## 2019-11-20 DIAGNOSIS — L048 Acute lymphadenitis of other sites: Secondary | ICD-10-CM | POA: Diagnosis not present

## 2019-11-20 DIAGNOSIS — Z800 Family history of malignant neoplasm of digestive organs: Secondary | ICD-10-CM | POA: Diagnosis not present

## 2019-11-20 DIAGNOSIS — D72822 Plasmacytosis: Secondary | ICD-10-CM | POA: Diagnosis present

## 2019-11-20 DIAGNOSIS — Z882 Allergy status to sulfonamides status: Secondary | ICD-10-CM

## 2019-11-20 DIAGNOSIS — I69354 Hemiplegia and hemiparesis following cerebral infarction affecting left non-dominant side: Secondary | ICD-10-CM

## 2019-11-20 DIAGNOSIS — K219 Gastro-esophageal reflux disease without esophagitis: Secondary | ICD-10-CM | POA: Diagnosis present

## 2019-11-20 DIAGNOSIS — R599 Enlarged lymph nodes, unspecified: Secondary | ICD-10-CM | POA: Diagnosis not present

## 2019-11-20 DIAGNOSIS — E11649 Type 2 diabetes mellitus with hypoglycemia without coma: Secondary | ICD-10-CM | POA: Diagnosis not present

## 2019-11-20 DIAGNOSIS — R509 Fever, unspecified: Secondary | ICD-10-CM

## 2019-11-20 DIAGNOSIS — Z853 Personal history of malignant neoplasm of breast: Secondary | ICD-10-CM

## 2019-11-20 DIAGNOSIS — E876 Hypokalemia: Secondary | ICD-10-CM | POA: Diagnosis present

## 2019-11-20 DIAGNOSIS — K4090 Unilateral inguinal hernia, without obstruction or gangrene, not specified as recurrent: Secondary | ICD-10-CM | POA: Diagnosis present

## 2019-11-20 DIAGNOSIS — Z90710 Acquired absence of both cervix and uterus: Secondary | ICD-10-CM

## 2019-11-20 DIAGNOSIS — Z7982 Long term (current) use of aspirin: Secondary | ICD-10-CM

## 2019-11-20 DIAGNOSIS — F419 Anxiety disorder, unspecified: Secondary | ICD-10-CM | POA: Diagnosis present

## 2019-11-20 DIAGNOSIS — Z79899 Other long term (current) drug therapy: Secondary | ICD-10-CM

## 2019-11-20 DIAGNOSIS — Z880 Allergy status to penicillin: Secondary | ICD-10-CM

## 2019-11-20 DIAGNOSIS — L03115 Cellulitis of right lower limb: Secondary | ICD-10-CM | POA: Diagnosis not present

## 2019-11-20 DIAGNOSIS — Z794 Long term (current) use of insulin: Secondary | ICD-10-CM

## 2019-11-20 LAB — HEMOGLOBIN A1C
Hgb A1c MFr Bld: 8.4 % — ABNORMAL HIGH (ref 4.8–5.6)
Mean Plasma Glucose: 194.38 mg/dL

## 2019-11-20 LAB — CBC WITH DIFFERENTIAL/PLATELET
Abs Immature Granulocytes: 0.09 10*3/uL — ABNORMAL HIGH (ref 0.00–0.07)
Basophils Absolute: 0.1 10*3/uL (ref 0.0–0.1)
Basophils Relative: 1 %
Eosinophils Absolute: 0.1 10*3/uL (ref 0.0–0.5)
Eosinophils Relative: 0 %
HCT: 45.6 % (ref 36.0–46.0)
Hemoglobin: 15.3 g/dL — ABNORMAL HIGH (ref 12.0–15.0)
Immature Granulocytes: 1 %
Lymphocytes Relative: 10 %
Lymphs Abs: 1.4 10*3/uL (ref 0.7–4.0)
MCH: 29.4 pg (ref 26.0–34.0)
MCHC: 33.6 g/dL (ref 30.0–36.0)
MCV: 87.7 fL (ref 80.0–100.0)
Monocytes Absolute: 0.8 10*3/uL (ref 0.1–1.0)
Monocytes Relative: 6 %
Neutro Abs: 12 10*3/uL — ABNORMAL HIGH (ref 1.7–7.7)
Neutrophils Relative %: 82 %
Platelets: 245 10*3/uL (ref 150–400)
RBC: 5.2 MIL/uL — ABNORMAL HIGH (ref 3.87–5.11)
RDW: 13.9 % (ref 11.5–15.5)
WBC: 14.5 10*3/uL — ABNORMAL HIGH (ref 4.0–10.5)
nRBC: 0 % (ref 0.0–0.2)

## 2019-11-20 LAB — COMPREHENSIVE METABOLIC PANEL
ALT: 26 U/L (ref 0–44)
AST: 19 U/L (ref 15–41)
Albumin: 4.2 g/dL (ref 3.5–5.0)
Alkaline Phosphatase: 79 U/L (ref 38–126)
Anion gap: 13 (ref 5–15)
BUN: 10 mg/dL (ref 6–20)
CO2: 23 mmol/L (ref 22–32)
Calcium: 9.5 mg/dL (ref 8.9–10.3)
Chloride: 102 mmol/L (ref 98–111)
Creatinine, Ser: 0.66 mg/dL (ref 0.44–1.00)
GFR calc Af Amer: >60 mL/min (ref >60)
GFR calc non Af Amer: >60 mL/min (ref >60)
Glucose, Bld: 191 mg/dL — ABNORMAL HIGH (ref 70–99)
Potassium: 3.6 mmol/L (ref 3.5–5.1)
Sodium: 138 mmol/L (ref 135–145)
Total Bilirubin: 0.7 mg/dL (ref 0.3–1.2)
Total Protein: 9.2 g/dL — ABNORMAL HIGH (ref 6.5–8.1)

## 2019-11-20 LAB — URINALYSIS, ROUTINE W REFLEX MICROSCOPIC
Bilirubin Urine: NEGATIVE
Glucose, UA: >=500 mg/dL — AB
Hgb urine dipstick: NEGATIVE
Ketones, ur: NEGATIVE mg/dL
Leukocytes,Ua: NEGATIVE
Nitrite: NEGATIVE
Protein, ur: 100 mg/dL — AB
Specific Gravity, Urine: 1.006 (ref 1.005–1.030)
pH: 5 (ref 5.0–8.0)

## 2019-11-20 LAB — MRSA PCR SCREENING: MRSA by PCR: NEGATIVE

## 2019-11-20 LAB — CBG MONITORING, ED: Glucose-Capillary: 183 mg/dL — ABNORMAL HIGH (ref 70–99)

## 2019-11-20 LAB — GLUCOSE, CAPILLARY: Glucose-Capillary: 107 mg/dL — ABNORMAL HIGH (ref 70–99)

## 2019-11-20 LAB — SARS CORONAVIRUS 2 BY RT PCR (HOSPITAL ORDER, PERFORMED IN ~~LOC~~ HOSPITAL LAB): SARS Coronavirus 2: NEGATIVE

## 2019-11-20 MED ORDER — FENTANYL CITRATE (PF) 100 MCG/2ML IJ SOLN
50.0000 ug | Freq: Once | INTRAMUSCULAR | Status: AC
  Administered 2019-11-20: 50 ug via INTRAVENOUS
  Filled 2019-11-20: qty 2

## 2019-11-20 MED ORDER — INSULIN GLARGINE 100 UNIT/ML ~~LOC~~ SOLN
10.0000 [IU] | Freq: Every day | SUBCUTANEOUS | Status: DC
  Administered 2019-11-21 – 2019-11-22 (×2): 10 [IU] via SUBCUTANEOUS
  Filled 2019-11-20 (×5): qty 0.1

## 2019-11-20 MED ORDER — MONTELUKAST SODIUM 10 MG PO TABS
10.0000 mg | ORAL_TABLET | Freq: Every day | ORAL | Status: DC
  Administered 2019-11-20 – 2019-11-24 (×5): 10 mg via ORAL
  Filled 2019-11-20 (×5): qty 1

## 2019-11-20 MED ORDER — CARIPRAZINE HCL 1.5 MG PO CAPS
1.5000 mg | ORAL_CAPSULE | Freq: Every day | ORAL | Status: DC
  Administered 2019-11-21 – 2019-11-24 (×4): 1.5 mg via ORAL
  Filled 2019-11-20 (×6): qty 1

## 2019-11-20 MED ORDER — GABAPENTIN 300 MG PO CAPS
300.0000 mg | ORAL_CAPSULE | Freq: Every day | ORAL | Status: DC
  Administered 2019-11-20 – 2019-11-23 (×4): 300 mg via ORAL
  Filled 2019-11-20 (×4): qty 1

## 2019-11-20 MED ORDER — ONDANSETRON HCL 4 MG/2ML IJ SOLN: 4.0000 mg | Freq: Four times a day (QID) | INTRAMUSCULAR | Status: DC | PRN

## 2019-11-20 MED ORDER — ENOXAPARIN SODIUM 40 MG/0.4ML ~~LOC~~ SOLN
40.0000 mg | SUBCUTANEOUS | Status: DC
  Administered 2019-11-20 – 2019-11-23 (×4): 40 mg via SUBCUTANEOUS
  Filled 2019-11-20 (×4): qty 0.4

## 2019-11-20 MED ORDER — ONDANSETRON HCL 4 MG/2ML IJ SOLN
4.0000 mg | Freq: Once | INTRAMUSCULAR | Status: AC
  Administered 2019-11-20: 4 mg via INTRAVENOUS
  Filled 2019-11-20: qty 2

## 2019-11-20 MED ORDER — ACETAMINOPHEN 325 MG PO TABS
650.0000 mg | ORAL_TABLET | Freq: Four times a day (QID) | ORAL | Status: DC | PRN
  Administered 2019-11-20: 650 mg via ORAL
  Filled 2019-11-20 (×3): qty 2

## 2019-11-20 MED ORDER — POTASSIUM CHLORIDE CRYS ER 10 MEQ PO TBCR
10.0000 meq | EXTENDED_RELEASE_TABLET | Freq: Every day | ORAL | Status: DC
  Administered 2019-11-20 – 2019-11-24 (×5): 10 meq via ORAL
  Filled 2019-11-20 (×5): qty 1

## 2019-11-20 MED ORDER — BACLOFEN 10 MG PO TABS
10.0000 mg | ORAL_TABLET | Freq: Three times a day (TID) | ORAL | Status: DC | PRN
  Administered 2019-11-21 – 2019-11-23 (×2): 10 mg via ORAL
  Filled 2019-11-20 (×2): qty 1

## 2019-11-20 MED ORDER — IOHEXOL 300 MG/ML  SOLN
100.0000 mL | Freq: Once | INTRAMUSCULAR | Status: AC | PRN
  Administered 2019-11-20: 100 mL via INTRAVENOUS

## 2019-11-20 MED ORDER — KETOROLAC TROMETHAMINE 30 MG/ML IJ SOLN
15.0000 mg | Freq: Once | INTRAMUSCULAR | Status: AC
  Administered 2019-11-20: 15 mg via INTRAVENOUS
  Filled 2019-11-20: qty 1

## 2019-11-20 MED ORDER — ROSUVASTATIN CALCIUM 20 MG PO TABS
40.0000 mg | ORAL_TABLET | Freq: Every day | ORAL | Status: DC
  Administered 2019-11-21 – 2019-11-24 (×4): 40 mg via ORAL
  Filled 2019-11-20: qty 2
  Filled 2019-11-20: qty 1
  Filled 2019-11-20: qty 2
  Filled 2019-11-20 (×2): qty 1
  Filled 2019-11-20: qty 2
  Filled 2019-11-20: qty 1
  Filled 2019-11-20: qty 2

## 2019-11-20 MED ORDER — ASPIRIN EC 81 MG PO TBEC
81.0000 mg | DELAYED_RELEASE_TABLET | Freq: Every day | ORAL | Status: DC
  Administered 2019-11-20 – 2019-11-24 (×5): 81 mg via ORAL
  Filled 2019-11-20 (×5): qty 1

## 2019-11-20 MED ORDER — HYDROCODONE-ACETAMINOPHEN 10-325 MG PO TABS
1.0000 | ORAL_TABLET | Freq: Four times a day (QID) | ORAL | Status: DC | PRN
  Administered 2019-11-20 – 2019-11-24 (×9): 1 via ORAL
  Filled 2019-11-20 (×10): qty 1

## 2019-11-20 MED ORDER — SODIUM CHLORIDE 0.9 % IV BOLUS
1000.0000 mL | Freq: Once | INTRAVENOUS | Status: AC
  Administered 2019-11-20: 1000 mL via INTRAVENOUS

## 2019-11-20 MED ORDER — VANCOMYCIN HCL IN DEXTROSE 1-5 GM/200ML-% IV SOLN
1000.0000 mg | Freq: Two times a day (BID) | INTRAVENOUS | Status: DC
  Administered 2019-11-21 – 2019-11-22 (×4): 1000 mg via INTRAVENOUS
  Filled 2019-11-20 (×4): qty 200

## 2019-11-20 MED ORDER — ACETAMINOPHEN 325 MG PO TABS
650.0000 mg | ORAL_TABLET | Freq: Once | ORAL | Status: AC
  Administered 2019-11-20: 650 mg via ORAL
  Filled 2019-11-20: qty 2

## 2019-11-20 MED ORDER — TICAGRELOR 90 MG PO TABS
90.0000 mg | ORAL_TABLET | Freq: Every day | ORAL | Status: DC
  Administered 2019-11-20 – 2019-11-24 (×5): 90 mg via ORAL
  Filled 2019-11-20 (×5): qty 1

## 2019-11-20 MED ORDER — CLINDAMYCIN PHOSPHATE 600 MG/50ML IV SOLN
600.0000 mg | Freq: Once | INTRAVENOUS | Status: AC
  Administered 2019-11-20: 600 mg via INTRAVENOUS
  Filled 2019-11-20: qty 50

## 2019-11-20 MED ORDER — HYDROCODONE-ACETAMINOPHEN 10-325 MG PO TABS: 1.0000 | ORAL_TABLET | Freq: Three times a day (TID) | ORAL | Status: DC

## 2019-11-20 MED ORDER — DULOXETINE HCL 60 MG PO CPEP
60.0000 mg | ORAL_CAPSULE | Freq: Every day | ORAL | Status: DC
  Administered 2019-11-20 – 2019-11-24 (×5): 60 mg via ORAL
  Filled 2019-11-20: qty 1
  Filled 2019-11-20: qty 2
  Filled 2019-11-20 (×3): qty 1

## 2019-11-20 MED ORDER — PANTOPRAZOLE SODIUM 40 MG PO TBEC
40.0000 mg | DELAYED_RELEASE_TABLET | Freq: Every day | ORAL | Status: DC
  Administered 2019-11-20 – 2019-11-24 (×5): 40 mg via ORAL
  Filled 2019-11-20 (×5): qty 1

## 2019-11-20 MED ORDER — LORATADINE 10 MG PO TABS
10.0000 mg | ORAL_TABLET | Freq: Every day | ORAL | Status: DC
  Administered 2019-11-21 – 2019-11-24 (×4): 10 mg via ORAL
  Filled 2019-11-20 (×4): qty 1

## 2019-11-20 MED ORDER — VANCOMYCIN HCL IN DEXTROSE 1-5 GM/200ML-% IV SOLN
1000.0000 mg | Freq: Once | INTRAVENOUS | Status: AC
  Administered 2019-11-20: 1000 mg via INTRAVENOUS
  Filled 2019-11-20: qty 200

## 2019-11-20 MED ORDER — INSULIN ASPART 100 UNIT/ML ~~LOC~~ SOLN
0.0000 [IU] | Freq: Three times a day (TID) | SUBCUTANEOUS | Status: DC
  Administered 2019-11-20: 3 [IU] via SUBCUTANEOUS
  Administered 2019-11-21: 2 [IU] via SUBCUTANEOUS
  Administered 2019-11-21: 3 [IU] via SUBCUTANEOUS
  Administered 2019-11-22: 1 [IU] via SUBCUTANEOUS
  Administered 2019-11-23: 2 [IU] via SUBCUTANEOUS
  Filled 2019-11-20: qty 1

## 2019-11-20 MED ORDER — INSULIN ASPART 100 UNIT/ML ~~LOC~~ SOLN: 0.0000 [IU] | Freq: Every day | SUBCUTANEOUS | Status: DC

## 2019-11-20 MED ORDER — DOCUSATE SODIUM 100 MG PO CAPS
200.0000 mg | ORAL_CAPSULE | Freq: Every day | ORAL | Status: DC | PRN
  Administered 2019-11-23: 200 mg via ORAL
  Filled 2019-11-20 (×2): qty 2

## 2019-11-20 MED ORDER — TEMAZEPAM 15 MG PO CAPS
15.0000 mg | ORAL_CAPSULE | Freq: Every evening | ORAL | Status: DC | PRN
  Administered 2019-11-21 – 2019-11-22 (×2): 15 mg via ORAL
  Filled 2019-11-20 (×2): qty 1

## 2019-11-20 MED ORDER — CLONAZEPAM 0.5 MG PO TABS: 0.5000 mg | ORAL_TABLET | Freq: Two times a day (BID) | ORAL | Status: DC | PRN

## 2019-11-20 MED ORDER — ONDANSETRON HCL 4 MG PO TABS: 4.0000 mg | ORAL_TABLET | Freq: Four times a day (QID) | ORAL | Status: DC | PRN

## 2019-11-20 MED ORDER — ICOSAPENT ETHYL 1 G PO CAPS
2.0000 g | ORAL_CAPSULE | Freq: Two times a day (BID) | ORAL | Status: DC
  Administered 2019-11-21 – 2019-11-24 (×6): 2 g via ORAL
  Filled 2019-11-20 (×11): qty 2

## 2019-11-20 NOTE — Progress Notes (Signed)
Pharmacy Antibiotic Note  EPSIE WALTHALL is a 49 y.o. female admitted on 11/20/2019 with cellulitis.  Pharmacy has been consulted for vancomycin dosing.  Plan: Loading dose: vancomycin 1g  IV q1h x2 doses to = vancomycin 2g total Maintenance dose: vancomycin 1g IV  q12h Goal vancomycin trough range: 10-15   mcg/mL Pharmacy will continue to monitor renal function, vancomycin troughs as clinically appropriate,  cultures and patient progress.   Height: 5\' 2"  (157.5 cm) Weight: 79 kg (174 lb 2.6 oz) IBW/kg (Calculated) : 50.1  Temp (24hrs), Avg:101.8 F (38.8 C), Min:100.9 F (38.3 C), Max:103 F (39.4 C)  Recent Labs  Lab 11/20/19 0509  WBC 14.5*  CREATININE 0.66    Estimated Creatinine Clearance: 84.7 mL/min (by C-G formula based on SCr of 0.66 mg/dL).     Antimicrobials this admission: vancomycin 8/13>>       Microbiology results: 8/13 [facility] x2:     8/13 Resp PCR: SARS CoV-2 negative  8/13 MRSA PCR:    Thank you for allowing pharmacy to be a part of this patient's care.  Despina Pole 11/20/2019 2:20 PM

## 2019-11-20 NOTE — ED Triage Notes (Signed)
Pt reports having groin pain for several days, pt was seen by PCP and started doxycyline for "swollen lymph node". Pt says this happened in 2014 and "it had to be surgically drained and had tunneled."  Pt reports increased pain to right groin.

## 2019-11-20 NOTE — H&P (Signed)
History and Physical    Ann Lopez DTO:[id number] DOB: 01-05-1971 DOA: 11/20/2019  PCP: Hayden Rasmussen, MD   Patient coming from: Home  Chief Complaint: R groin pain, N/V  HPI: Ann Lopez is a 49 y.o. female with medical history significant for prior right MCA CVA, right ICA stent, hypertension, dyslipidemia, diabetes, obesity, and anxiety who presented to the ED with worsening right-sided groin pain over the last several days.  She states that this began Friday of last week and initially started with lack of appetite as well as some nausea and vomiting.  She states that the pain in her groin became worse and she went to see her PCP who noted possible infected lymph node on the right side and prescribed her oral doxycycline.  She apparently had just finished a course of doxycycline for an infection on her finger as well.  She states that her pain began to worsen and she developed a fever which prompted the ED visit.  She was also noted to have some chills.   ED Course: Patient with temperature 103 Fahrenheit noted that has improved to 1-1.5 at this time.  Leukocytosis of 14,500 noted and glucose 191.  She seems emotionally distraught and continues to complain of right lower quadrant abdominal pain.  She was started on IV clindamycin in the ED and given 2 L fluid bolus as well as pain medications.  CT of the abdomen and pelvis demonstrates a left-sided inguinal hernia with small bowel loops, but also findings consistent with lymphadenitis over the right groin with no findings of abscess at this time.  Review of Systems: All others reviewed as noted above and otherwise negative.   Past Medical History:  Diagnosis Date  . Allergy   . Anxiety   . Asthma   . Back pain   . Breast cancer ([place])   . Cancer ([place])   . Depression   . Diabetes mellitus   . Headache   . Hyperlipemia   . Osteopenia   . Osteoporosis    osteopenia  . Stroke [facility])     Past Surgical History:  Procedure  Laterality Date  . ABDOMINAL HYSTERECTOMY     B oophorectomy for BRCA1 gene  . BREAST SURGERY    . CESAREAN SECTION    . INCISION AND DRAINAGE ABSCESS Left 11/14/2012   Procedure: INCISION AND DRAINAGE ABSCESS LEFT GROIN;  Surgeon: Jamesetta So, MD;  Location: [facility];  Service: General;  Laterality: Left;  . IR ANGIO VERTEBRAL SEL SUBCLAVIAN INNOMINATE UNI R MOD SED  03/02/2019  . IR CT HEAD LTD  03/02/2019  . IR INTRAVSC STENT CERV CAROTID W/O EMB-PROT MOD SED INC ANGIO  03/02/2019  . IR PERCUTANEOUS ART THROMBECTOMY/INFUSION INTRACRANIAL INC DIAG ANGIO  03/02/2019  . LYMPHADENECTOMY    . MASTECTOMY Bilateral   . RADIOLOGY WITH ANESTHESIA N/A 03/02/2019   Procedure: IR WITH ANESTHESIA;  Surgeon: Luanne Bras, MD;  Location: [place];  Service: Radiology;  Laterality: N/A;     reports that she quit smoking about 8 months ago. Her smoking use included cigarettes. She smoked 0.50 packs per day. She has never used smokeless tobacco. She reports that she does not drink alcohol and does not use drugs.  Allergies  Allergen Reactions  . Azithromycin Other (See Comments)    Unknown reaction  . Morphine And Related Other (See Comments)    Unknown reaction - Can tolerate hydrocodone and dilaudid without side effects  . Nitrofurantoin Other (See Comments)  Unknown reaction  . Penicillins Hives and Itching    Has patient had a PCN reaction causing immediate rash, facial/tongue/throat swelling, SOB or lightheadedness with hypotension: Yes Has patient had a PCN reaction causing severe rash involving mucus membranes or skin necrosis: No Has patient had a PCN reaction that required hospitalization No Has patient had a PCN reaction occurring within the last 10 years: No If all of the above answers are "NO", then may proceed with Cephalosporin use.   . Sulfonamide Derivatives Hives    Family History  Problem Relation Age of Onset  . Cancer Mother 77       breast cancer  . Hypertension  Sister   . Hypothyroidism Sister   . Cancer Sister   . Hypothyroidism Brother   . Cancer Maternal Grandmother 44       breast cancer  . Cancer Maternal Grandfather 106       pancreatic cancer    Prior to Admission medications   Medication Sig Start Date End Date Taking? Authorizing Provider  aspirin EC 81 MG tablet Take 1 tablet (81 mg total) by mouth daily. 12/20/15  Yes Kathie Dike, MD  baclofen (LIORESAL) 10 MG tablet Take 10 mg by mouth 3 (three) times daily as needed.   Yes [provider]  Biotin w/ Vitamins C & E (HAIR SKIN & NAILS GUMMIES PO) Take 2 Doses by mouth daily.   Yes [provider]  Budeson-Glycopyrrol-Formoterol (BREZTRI AEROSPHERE) 160-9-4.8 MCG/ACT AERO Inhale 2 puffs into the lungs in the morning and at bedtime.    Yes [provider]  canagliflozin (INVOKANA) 300 MG TABS tablet Take 1 tablet (300 mg total) by mouth daily before breakfast. 03/31/19  Yes Love, Ivan Anchors, PA-C  cariprazine (VRAYLAR) capsule Take 1.5 mg by mouth daily. Pt's PCP has been giving her samples   Yes [provider]  clonazePAM (KLONOPIN) 0.5 MG tablet Take 0.5 tablets (0.25 mg total) by mouth 2 (two) times daily as needed for anxiety. Patient taking differently: Take 0.25 mg by mouth 2 (two) times daily as needed for anxiety. 1/2 tab(0.14m) at bedtime and 1/2 tab(0.252m during the day if needed. 03/31/19  Yes Love, PaIvan AnchorsPA-C  docusate sodium (COLACE) 100 MG capsule Take 200 mg by mouth daily as needed.    Yes [provider]  doxycycline (VIBRAMYCIN) 100 MG capsule Take 100 mg by mouth 2 (two) times daily. 11/18/19  Yes [provider]  DULoxetine (CYMBALTA) 60 MG capsule TAKE 1 CAPSULE BY MOUTH EVERY DAY Patient taking differently: Take 60 mg by mouth daily.  04/22/19  Yes Raulkar, KrClide DeutscherMD  gabapentin (NEURONTIN) 300 MG capsule Take 300 mg by mouth at bedtime.   Yes [provider]  HUMALOG KWIKPEN 100 UNIT/ML KwikPen  Inject 8 Units into the skin daily as needed (per sliding scale).  10/02/19  Yes [provider]  HYDROcodone-acetaminophen (NORCO) 10-325 MG tablet Take 1 tablet by mouth every 8 (eight) hours.  04/07/19  Yes [provider]  icosapent Ethyl (VASCEPA) 1 g capsule Take 2 capsules (2 g total) by mouth 2 (two) times daily. 04/13/19  Yes O'Neal, WeCassie FreerMD  Insulin Glargine (LANTUS) 100 UNIT/ML Solostar Pen Inject 15 Units into the skin daily. Patient taking differently: Inject 29 Units into the skin at bedtime.  03/31/19  Yes Love, PaIvan AnchorsPA-C  Insulin Pen Needle (PEN NEEDLES) 32G X 5 MM MISC 1 application by Does not apply route at bedtime. 03/31/19  Yes Love, Ivan Anchors, PA-C  Lancets (ONETOUCH ULTRASOFT) lancets Use as instructed; check sugar before meals and at bedtime Patient taking differently: 1 each by Other route as needed. Use as instructed; check sugar before meals and at bedtime. Patient uses test strips and lancets. 03/31/19  Yes Love, Ivan Anchors, PA-C  Levocetirizine Dihydrochloride (XYZAL PO) Take 1 tablet by mouth daily.    Yes [provider]  montelukast (SINGULAIR) 10 MG tablet Take 10 mg by mouth daily.  05/05/19  Yes [provider]  Multiple Vitamins-Minerals (MULTIVITAMIN GUMMIES WOMENS PO) Take 2 Doses/Fill by mouth daily.    Yes [provider]  omeprazole (PRILOSEC) 20 MG capsule Take 20 mg by mouth daily.    Yes [provider]  potassium chloride (KLOR-CON) 10 MEQ tablet Take 1 tablet (10 mEq total) by mouth 2 (two) times daily. Patient taking differently: Take 10 mEq by mouth daily.  03/31/19  Yes Love, Ivan Anchors, PA-C  rosuvastatin (CRESTOR) 40 MG tablet Take 1 tablet (40 mg total) by mouth daily. 04/13/19 11/20/19 Yes O'Neal, Cassie Freer, MD  temazepam (RESTORIL) 15 MG capsule Take 1 capsule (15 mg total) by mouth at bedtime as needed for sleep. 05/08/19  Yes Raulkar, Clide Deutscher, MD  ticagrelor (BRILINTA) 90 MG TABS  tablet Take 1 tablet (90 mg total) by mouth 2 (two) times daily. Patient taking differently: Take 90 mg by mouth daily.  03/31/19  Yes Love, Ivan Anchors, PA-C  Vitamin D, Ergocalciferol, (DRISDOL) 1.25 MG (50000 UNIT) CAPS capsule Take 50,000 Units by mouth 2 (two) times a week. 10/22/19  Yes [provider]  fluticasone (FLONASE) 50 MCG/ACT nasal spray Place 2 sprays into the nose daily. Patient not taking: Reported on 03/10/2014 03/08/12 03/10/14  Robyn Haber, MD    Physical Exam: Vitals:   11/20/19 0500 11/20/19 0600 11/20/19 0725 11/20/19 1258  BP: 106/76 113/78    Pulse: (!) 101 95    Resp:  18    Temp:   (!) 103 F (39.4 C) (!) 101.5 F (38.6 C)  TempSrc:   Oral Oral  SpO2: 99% 100%    Weight:      Height:        Constitutional: NAD, calm, comfortable Vitals:   11/20/19 0500 11/20/19 0600 11/20/19 0725 11/20/19 1258  BP: 106/76 113/78    Pulse: (!) 101 95    Resp:  18    Temp:   (!) 103 F (39.4 C) (!) 101.5 F (38.6 C)  TempSrc:   Oral Oral  SpO2: 99% 100%    Weight:      Height:       Eyes: lids and conjunctivae normal ENMT: Mucous membranes are moist.  Neck: normal, supple Respiratory: clear to auscultation bilaterally. Normal respiratory effort. No accessory muscle use.  Cardiovascular: Regular rate and rhythm, no murmurs. No extremity edema. Abdomen: no tenderness, no distention. Bowel sounds positive.  Musculoskeletal:  No joint deformity upper and lower extremities.   Skin: no rashes, lesions, ulcers.  Psychiatric: Normal judgment and insight. Alert and oriented x 3. Normal mood.   Labs on Admission: I have personally reviewed following labs and imaging studies  CBC: Recent Labs  Lab 11/20/19 0509  WBC 14.5*  NEUTROABS 12.0*  HGB 15.3*  HCT 45.6  MCV 87.7  PLT 449   Basic Metabolic Panel: Recent Labs  Lab 11/20/19 0509  NA 138  K 3.6  CL 102  CO2 23  GLUCOSE 191*  BUN 10  CREATININE  0.66  CALCIUM 9.5   GFR: Estimated  Creatinine Clearance: 84.7 mL/min (by C-G formula based on SCr of 0.66 mg/dL). Liver Function Tests: Recent Labs  Lab 11/20/19 0509  AST 19  ALT 26  ALKPHOS 79  BILITOT 0.7  PROT 9.2*  ALBUMIN 4.2   No results for input(s): LIPASE, AMYLASE in the last 168 hours. No results for input(s): AMMONIA in the last 168 hours. Coagulation Profile: No results for input(s): INR, PROTIME in the last 168 hours. Cardiac Enzymes: No results for input(s): CKTOTAL, CKMB, CKMBINDEX, TROPONINI in the last 168 hours. BNP (last 3 results) No results for input(s): PROBNP in the last 8760 hours. HbA1C: No results for input(s): HGBA1C in the last 72 hours. CBG: No results for input(s): GLUCAP in the last 168 hours. Lipid Profile: No results for input(s): CHOL, HDL, LDLCALC, TRIG, CHOLHDL, LDLDIRECT in the last 72 hours. Thyroid Function Tests: No results for input(s): TSH, T4TOTAL, FREET4, T3FREE, THYROIDAB in the last 72 hours. Anemia Panel: No results for input(s): VITAMINB12, FOLATE, FERRITIN, TIBC, IRON, RETICCTPCT in the last 72 hours. Urine analysis:    Component Value Date/Time   COLORURINE YELLOW 03/02/2019 1240   APPEARANCEUR CLEAR 03/02/2019 1240   LABSPEC >1.046 (H) 03/02/2019 1240   PHURINE 5.0 03/02/2019 1240   GLUCOSEU >=500 (A) 03/02/2019 1240   HGBUR NEGATIVE 03/02/2019 1240   HGBUR negative 12/30/2006 1457   BILIRUBINUR NEGATIVE 03/02/2019 1240   BILIRUBINUR negative 12/06/2015 1204   BILIRUBINUR neg 10/24/2014 1332   KETONESUR 5 (A) 03/02/2019 1240   PROTEINUR NEGATIVE 03/02/2019 1240   UROBILINOGEN 0.2 12/06/2015 1204   UROBILINOGEN 0.2 02/04/2015 1413   NITRITE NEGATIVE 03/02/2019 1240   LEUKOCYTESUR MODERATE (A) 03/02/2019 1240    Radiological Exams on Admission: CT Abdomen Pelvis W Contrast  Result Date: 11/20/2019 CLINICAL DATA:  Right groin pain and swelling. Right lower quadrant abdominal pain. EXAM: CT ABDOMEN AND PELVIS WITH CONTRAST TECHNIQUE: Multidetector CT  imaging of the abdomen and pelvis was performed using the standard protocol following bolus administration of intravenous contrast. CONTRAST:  144m OMNIPAQUE IOHEXOL 300 MG/ML  SOLN COMPARISON:  CT scan 12/19/2015 FINDINGS: Lower chest: The lung bases are clear of acute process. No pleural effusion or pulmonary lesions. The heart is normal in size. No pericardial effusion. The distal esophagus and aorta are unremarkable. Hepatobiliary: No hepatic lesions or intrahepatic biliary dilatation. Possible small gallstones. No findings for acute cholecystitis. No common bile duct dilatation. Pancreas: No mass, inflammation or ductal dilatation. Spleen: Normal size. No focal lesions. Adrenals/Urinary Tract: The adrenal glands and kidneys are unremarkable. No renal, ureteral or bladder calculi or mass. No significant collecting system abnormalities are identified on the delayed images. Stomach/Bowel: The stomach demonstrates stable postoperative changes from probable gastric stapling procedure. No complicating features. The duodenum, small bowel and colon are grossly normal. No acute inflammatory changes, mass lesions or obstructive findings. The terminal ileum is normal. The appendix is normal. Vascular/Lymphatic: The aorta is normal in caliber. No dissection. The branch vessels are patent. The major venous structures are patent. No mesenteric or retroperitoneal mass or adenopathy. Small scattered lymph nodes are noted. Reproductive: Surgically absent. Other: There is a slightly larger left inguinal hernia containing small bowel loops but no findings for incarceration or obstruction. Inflammatory process noted in the right inguinal region. Moderate interstitial changes/inflammation/edema without discrete fluid collection/abscess. There are also enlarged right inguinal and femoral lymph nodes. Right inguinal node on image 87/2 measures 14 mm and right external iliac/femoral lymph node  on image 80/2 measures 10.5 mm. The  inguinal vessels appear normal. No evidence for venous thrombosis. Musculoskeletal: No significant bony findings. IMPRESSION: 1. Nonspecific inflammatory process in the right inguinal region without discrete fluid collection/abscess. There are also enlarged right inguinal and femoral lymph nodes suggesting lymphadenitis. 2. No findings for venous thrombosis. 3. Slightly larger left inguinal hernia containing small bowel loops but no findings for incarceration or obstruction. 4. Stable postoperative changes from probable gastric stapling procedure. 5. Possible small gallstones. Electronically Signed   By: Marijo Sanes M.D.   On: 11/20/2019 07:22    Assessment/Plan Active Problems:   Sepsis ([place])    Sepsis secondary to nonpurulent cellulitis/lymphadenitis of right groin -Noted to have leukocytosis as well as temperature elevation -Unknown etiology of lymphadenitis -Continue antibiotic coverage as prescribed -Continue to monitor repeat CBC and fever curve -Tylenol as needed for fever  Type 2 diabetes with hyperglycemia -Continue home long-acting insulin at half dose until diet can be tolerated -Hold home Invokana -SSI  Prior right MCA CVA/dyslipidemia -With noted prior right ICA stent -Continue on statin -Continue aspirin and Brilinta  History of hypertension -Continue to monitor  Anxiety -Continue home medications  GERD -PPI  Obesity -Lifestyle changes   DVT prophylaxis: Lovenox Code Status: Full Family Communication: None at bedside Disposition Plan:Admit for IV abx treatment Consults called:General Surgery Admission status: Inpatient, [place] [facility]  If 7PM-7AM, please contact night-coverage [email or web address]  11/20/2019, 1:05 PM

## 2019-11-20 NOTE — Consult Note (Signed)
Reason for Consult: Sepsis, right groin cellulitis Referring Physician: Dr. Scarlett Presto Ann Lopez is an 49 y.o. female.  HPI: Patient is a 49 year old white female who presented to the emergency room with a several day history of worsening right groin pain. She was started on doxycycline as an outpatient but developed fevers and chills. She presented to the emergency room for further evaluation and treatment. She was noted to have an elevated white blood cell count. A CT scan of the abdomen and pelvis reveals lymphadenitis and cellulitis in the right groin region. The etiology is unknown. Her appendix is normal. She does have a nonobstructing left inguinal hernia. I have performed an I&D of the left groin abscess in 2015.  Past Medical History:  Diagnosis Date  . Allergy   . Anxiety   . Asthma   . Back pain   . Breast cancer ([place])   . Cancer ([place])   . Depression   . Diabetes mellitus   . Headache   . Hyperlipemia   . Osteopenia   . Osteoporosis    osteopenia  . Stroke [facility])     Past Surgical History:  Procedure Laterality Date  . ABDOMINAL HYSTERECTOMY     B oophorectomy for BRCA1 gene  . BREAST SURGERY    . CESAREAN SECTION    . INCISION AND DRAINAGE ABSCESS Left 11/14/2012   Procedure: INCISION AND DRAINAGE ABSCESS LEFT GROIN;  Surgeon: Jamesetta So, MD;  Location: [facility];  Service: General;  Laterality: Left;  . IR ANGIO VERTEBRAL SEL SUBCLAVIAN INNOMINATE UNI R MOD SED  03/02/2019  . IR CT HEAD LTD  03/02/2019  . IR INTRAVSC STENT CERV CAROTID W/O EMB-PROT MOD SED INC ANGIO  03/02/2019  . IR PERCUTANEOUS ART THROMBECTOMY/INFUSION INTRACRANIAL INC DIAG ANGIO  03/02/2019  . LYMPHADENECTOMY    . MASTECTOMY Bilateral   . RADIOLOGY WITH ANESTHESIA N/A 03/02/2019   Procedure: IR WITH ANESTHESIA;  Surgeon: Luanne Bras, MD;  Location: [place];  Service: Radiology;  Laterality: N/A;    Family History  Problem Relation Age of Onset  . Cancer Mother 63       breast cancer   . Hypertension Sister   . Hypothyroidism Sister   . Cancer Sister   . Hypothyroidism Brother   . Cancer Maternal Grandmother 36       breast cancer  . Cancer Maternal Grandfather 72       pancreatic cancer    Social History:  reports that she quit smoking about 8 months ago. Her smoking use included cigarettes. She smoked 0.50 packs per day. She has never used smokeless tobacco. She reports that she does not drink alcohol and does not use drugs.  Allergies:  Allergies  Allergen Reactions  . Azithromycin Other (See Comments)    Unknown reaction  . Morphine And Related Other (See Comments)    Unknown reaction - Can tolerate hydrocodone and dilaudid without side effects  . Nitrofurantoin Other (See Comments)    Unknown reaction  . Penicillins Hives and Itching    Has patient had a PCN reaction causing immediate rash, facial/tongue/throat swelling, SOB or lightheadedness with hypotension: Yes Has patient had a PCN reaction causing severe rash involving mucus membranes or skin necrosis: No Has patient had a PCN reaction that required hospitalization No Has patient had a PCN reaction occurring within the last 10 years: No If all of the above answers are "NO", then may proceed with Cephalosporin use.   . Sulfonamide Derivatives  Hives    Medications: I have reviewed the patient's current medications.  Results for orders placed or performed during the hospital encounter of 11/20/19 (from the past 48 hour(s))  Comprehensive metabolic panel     Status: Abnormal   Collection Time: 11/20/19  5:09 AM  Result Value Ref Range   Sodium 138 135 - 145 mmol/L   Potassium 3.6 3.5 - 5.1 mmol/L   Chloride 102 98 - 111 mmol/L   CO2 23 22 - 32 mmol/L   Glucose, Bld 191 (H) 70 - 99 mg/dL    Comment: Glucose reference range applies only to samples taken after fasting for at least 8 hours.   BUN 10 6 - 20 mg/dL   Creatinine, Ser 0.66 0.44 - 1.00 mg/dL   Calcium 9.5 8.9 - 10.3 mg/dL   Total  Protein 9.2 (H) 6.5 - 8.1 g/dL   Albumin 4.2 3.5 - 5.0 g/dL   AST 19 15 - 41 U/L   ALT 26 0 - 44 U/L   Alkaline Phosphatase 79 38 - 126 U/L   Total Bilirubin 0.7 0.3 - 1.2 mg/dL   GFR calc non Af Amer >60 >60 mL/min   GFR calc Af Amer >60 >60 mL/min   Anion gap 13 5 - 15    Comment: Performed at [facility], [address], [place], [place] [address]  CBC with Differential     Status: Abnormal   Collection Time: 11/20/19  5:09 AM  Result Value Ref Range   WBC 14.5 (H) 4.0 - 10.5 K/uL   RBC 5.20 (H) 3.87 - 5.11 MIL/uL   Hemoglobin 15.3 (H) 12.0 - 15.0 g/dL   HCT 45.6 36 - 46 %   MCV 87.7 80.0 - 100.0 fL   MCH 29.4 26.0 - 34.0 pg   MCHC 33.6 30.0 - 36.0 g/dL   RDW 13.9 11.5 - 15.5 %   Platelets 245 150 - 400 K/uL   nRBC 0.0 0.0 - 0.2 %   Neutrophils Relative % 82 %   Neutro Abs 12.0 (H) 1.7 - 7.7 K/uL   Lymphocytes Relative 10 %   Lymphs Abs 1.4 0.7 - 4.0 K/uL   Monocytes Relative 6 %   Monocytes Absolute 0.8 0 - 1 K/uL   Eosinophils Relative 0 %   Eosinophils Absolute 0.1 0 - 0 K/uL   Basophils Relative 1 %   Basophils Absolute 0.1 0 - 0 K/uL   Immature Granulocytes 1 %   Abs Immature Granulocytes 0.09 (H) 0.00 - 0.07 K/uL    Comment: Performed at [facility], [address], [place], [place] [address]    CT Abdomen Pelvis W Contrast  Result Date: 11/20/2019 CLINICAL DATA:  Right groin pain and swelling. Right lower quadrant abdominal pain. EXAM: CT ABDOMEN AND PELVIS WITH CONTRAST TECHNIQUE: Multidetector CT imaging of the abdomen and pelvis was performed using the standard protocol following bolus administration of intravenous contrast. CONTRAST:  122m OMNIPAQUE IOHEXOL 300 MG/ML  SOLN COMPARISON:  CT scan 12/19/2015 FINDINGS: Lower chest: The lung bases are clear of acute process. No pleural effusion or pulmonary lesions. The heart is normal in size. No pericardial effusion. The distal esophagus and aorta are unremarkable. Hepatobiliary: No hepatic lesions or intrahepatic  biliary dilatation. Possible small gallstones. No findings for acute cholecystitis. No common bile duct dilatation. Pancreas: No mass, inflammation or ductal dilatation. Spleen: Normal size. No focal lesions. Adrenals/Urinary Tract: The adrenal glands and kidneys are unremarkable. No renal, ureteral or bladder calculi or  mass. No significant collecting system abnormalities are identified on the delayed images. Stomach/Bowel: The stomach demonstrates stable postoperative changes from probable gastric stapling procedure. No complicating features. The duodenum, small bowel and colon are grossly normal. No acute inflammatory changes, mass lesions or obstructive findings. The terminal ileum is normal. The appendix is normal. Vascular/Lymphatic: The aorta is normal in caliber. No dissection. The branch vessels are patent. The major venous structures are patent. No mesenteric or retroperitoneal mass or adenopathy. Small scattered lymph nodes are noted. Reproductive: Surgically absent. Other: There is a slightly larger left inguinal hernia containing small bowel loops but no findings for incarceration or obstruction. Inflammatory process noted in the right inguinal region. Moderate interstitial changes/inflammation/edema without discrete fluid collection/abscess. There are also enlarged right inguinal and femoral lymph nodes. Right inguinal node on image 87/2 measures 14 mm and right external iliac/femoral lymph node on image 80/2 measures 10.5 mm. The inguinal vessels appear normal. No evidence for venous thrombosis. Musculoskeletal: No significant bony findings. IMPRESSION: 1. Nonspecific inflammatory process in the right inguinal region without discrete fluid collection/abscess. There are also enlarged right inguinal and femoral lymph nodes suggesting lymphadenitis. 2. No findings for venous thrombosis. 3. Slightly larger left inguinal hernia containing small bowel loops but no findings for incarceration or obstruction.  4. Stable postoperative changes from probable gastric stapling procedure. 5. Possible small gallstones. Electronically Signed   By: Marijo Sanes M.D.   On: 11/20/2019 07:22    ROS:  Pertinent items are noted in HPI.  Blood pressure 113/78, pulse 95, temperature (!) 103 F (39.4 C), temperature source Oral, resp. rate 18, height 5' 2" (1.575 m), weight 79 kg, SpO2 100 %. Physical Exam: Pleasant white female no acute distress Head is normocephalic, atraumatic Lungs clear to auscultation with your breath sounds bilaterally Heart examination reveals regular rate and rhythm without S3, S4, murmurs Abdomen is soft, nontender, nondistended. A reducible left inguinal hernia is present. In the right groin region, she does have significant tenderness and induration present. No erythema is noted. She does have multiple small healed pustules in the suprapubic region. No hernias present in the right groin region.  CT scan images personally reviewed  Assessment/Plan: Impression: Cellulitis and lymphadenitis of unknown etiology, right groin. Plan: Would continue IV antibiotics, especially covering for staph. There is no abscess cavity to drain at this point. Will follow with you.  Aviva Signs 11/20/2019, 8:57 AM

## 2019-11-20 NOTE — ED Provider Notes (Signed)
[facility] EMERGENCY DEPARTMENT Provider Note   CSN: [id number] Arrival date & time: 11/20/19  0405   Time seen 4:55 AM  History Chief Complaint  Patient presents with   Groin Pain    right     Ann Lopez is a 49 y.o. female.  HPI   Patient states August 6 she felt fatigued and weak.  She started having pain in her right groin and was seen by her PCP on August 10.  She had blood work done which showed an elevated white blood cell count.  At that time she thought she had a infected lymph node in her groin.  She was started on doxycycline which patient states she is taking for 2 days without improvement.  She states about 24 hours ago she started getting increasing pain.  She has had fever and chills and states her highest fever was last night at 5 PM when it was 101.5.  She took Aleve at 8 PM.  She denies any urinary symptoms.  She states she has trouble walking because it hurts when she walks.  She had nausea and vomited once last night.  She states her CBGs have been "a little bit high" about 230.  Patient states she is taking her hydroxycodone 10/325 without pain relief.  Patient states she had something similar in 2014 however there was a palpable swelling in her groin and she was admitted to the hospital and was taken to the OR by Dr. Arnoldo Morale to drain an abscess.  Patient reports she has left-sided weakness after having had a stroke however that is improving with therapy.  PCP Hayden Rasmussen, MD   Past Medical History:  Diagnosis Date   Allergy    Anxiety    Asthma    Back pain    Breast cancer ([place])    Cancer ([place])    Depression    Diabetes mellitus    Headache    Hyperlipemia    Osteopenia    Osteoporosis    osteopenia   Stroke [facility])     Patient Active Problem List   Diagnosis Date Noted   Vascular headache    Other chronic pain    Uncontrolled type 2 diabetes mellitus with hyperglycemia ([place])    Labile blood glucose    Panic  disorder with agoraphobia and moderate panic attacks    Major depressive disorder, recurrent episode, moderate (HCC)    MCI (mild cognitive impairment) with memory loss    Acute ischemic right middle cerebral artery (MCA) stroke ([place]) 03/09/2019   Left hemiparesis (HCC)    Bradycardia    Tachypnea    SIRS (systemic inflammatory response syndrome) (HCC)    Hypokalemia    Diabetes mellitus type 2 in obese ([place])    Tobacco abuse    History of breast cancer    Cognitive deficits    Dysphagia, post-stroke    Stroke (cerebrum) ([place]) 03/02/2019   Middle cerebral artery embolism, right 03/02/2019   Chest pain 12/19/2015   Numbness 12/19/2015   Femoral hernia 03/24/2014   Dyslipidemia 09/10/2011   BRCA1 positive 09/10/2011   Breast cancer ([place]) 09/10/2011   H/O gastric bypass; 05/14/11([facility]) 09/10/2011   Essential hypertension 10/28/2008   DEPRESSION/ANXIETY 02/19/2007   CARPAL TUNNEL SYNDROME, RIGHT 02/19/2007   ALLERGIC RHINITIS 02/19/2007   GERD 02/19/2007   IRRITABLE BOWEL SYNDROME 02/19/2007   HERPES GENITALIS 12/30/2006   Diabetes ([place]) 12/30/2006   OSTEOPENIA 12/30/2006   ANOMALY, CONGENITAL, NERVOUS SYSTEM NEC 12/30/2006  Past Surgical History:  Procedure Laterality Date   ABDOMINAL HYSTERECTOMY     B oophorectomy for BRCA1 gene   BREAST SURGERY     CESAREAN SECTION     INCISION AND DRAINAGE ABSCESS Left 11/14/2012   Procedure: INCISION AND DRAINAGE ABSCESS LEFT GROIN;  Surgeon: Jamesetta So, MD;  Location: [facility];  Service: General;  Laterality: Left;   IR ANGIO VERTEBRAL SEL SUBCLAVIAN INNOMINATE UNI R MOD SED  03/02/2019   IR CT HEAD LTD  03/02/2019   IR INTRAVSC STENT CERV CAROTID W/O EMB-PROT MOD SED INC ANGIO  03/02/2019   IR PERCUTANEOUS ART THROMBECTOMY/INFUSION INTRACRANIAL INC DIAG ANGIO  03/02/2019   LYMPHADENECTOMY     MASTECTOMY Bilateral    RADIOLOGY WITH ANESTHESIA N/A 03/02/2019   Procedure: IR WITH  ANESTHESIA;  Surgeon: Luanne Bras, MD;  Location: [place];  Service: Radiology;  Laterality: N/A;     OB History   No obstetric history on file.     Family History  Problem Relation Age of Onset   Cancer Mother 62       breast cancer   Hypertension Sister    Hypothyroidism Sister    Cancer Sister    Hypothyroidism Brother    Cancer Maternal Grandmother 24       breast cancer   Cancer Maternal Grandfather 46       pancreatic cancer    Social History   Tobacco Use   Smoking status: Former Smoker    Packs/day: 0.50    Types: Cigarettes    Quit date: 03/02/2019    Years since quitting: 0.7   Smokeless tobacco: Never Used  Substance Use Topics   Alcohol use: No    Alcohol/week: 0.0 standard drinks   Drug use: No    Home Medications Prior to Admission medications   Medication Sig Start Date End Date Taking? Authorizing Provider  albuterol (PROVENTIL HFA;VENTOLIN HFA) 108 (90 Base) MCG/ACT inhaler Inhale 1 puff into the lungs every 6 (six) hours as needed for wheezing or shortness of breath. 07/28/15   Robyn Haber, MD  aspirin EC 81 MG tablet Take 1 tablet (81 mg total) by mouth daily. 12/20/15   Kathie Dike, MD  baclofen (LIORESAL) 10 MG tablet Take 10 mg by mouth 3 (three) times daily as needed.    [provider]  canagliflozin (INVOKANA) 300 MG TABS tablet Take 1 tablet (300 mg total) by mouth daily before breakfast. 03/31/19   Love, Ivan Anchors, PA-C  clonazePAM (KLONOPIN) 0.5 MG tablet Take 0.5 tablets (0.25 mg total) by mouth 2 (two) times daily as needed for anxiety. 03/31/19   Love, Ivan Anchors, PA-C  docusate sodium (COLACE) 100 MG capsule Take 200 mg by mouth at bedtime.    [provider]  DULoxetine (CYMBALTA) 60 MG capsule TAKE 1 CAPSULE BY MOUTH EVERY DAY Patient taking differently: Take 60 mg by mouth daily.  04/22/19   Raulkar, Clide Deutscher, MD  gabapentin (NEURONTIN) 300 MG capsule Take 300 mg by mouth at bedtime.    [provider]  HUMALOG KWIKPEN 100 UNIT/ML KwikPen SMARTSIG:6 Unit(s) SUB-Q 10/02/19   [provider]  HYDROcodone-acetaminophen (NORCO) 10-325 MG tablet Take 1 tablet by mouth every 6 (six) hours as needed for moderate pain.  04/07/19   [provider]  icosapent Ethyl (VASCEPA) 1 g capsule Take 2 capsules (2 g total) by mouth 2 (two) times daily. 04/13/19   O'NealCassie Freer, MD  Insulin Glargine (LANTUS) 100 UNIT/ML  Solostar Pen Inject 15 Units into the skin daily. Patient taking differently: Inject 15 Units into the skin at bedtime.  03/31/19   Love, Ivan Anchors, PA-C  Insulin Pen Needle (PEN NEEDLES) 32G X 5 MM MISC 1 application by Does not apply route at bedtime. 03/31/19   Love, Ivan Anchors, PA-C  Lancets [facility] ULTRASOFT) lancets Use as instructed; check sugar before meals and at bedtime 03/31/19   Love, Pamela S, PA-C  Levocetirizine Dihydrochloride (XYZAL PO) Take by mouth at bedtime.    [provider]  montelukast (SINGULAIR) 10 MG tablet Take 10 mg by mouth at bedtime. 05/05/19   [provider]  omeprazole (PRILOSEC) 40 MG capsule Take 40 mg by mouth daily.    [provider]  potassium chloride (KLOR-CON) 10 MEQ tablet Take 1 tablet (10 mEq total) by mouth 2 (two) times daily. Patient taking differently: Take 10 mEq by mouth daily.  03/31/19   Love, Ivan Anchors, PA-C  rosuvastatin (CRESTOR) 40 MG tablet Take 1 tablet (40 mg total) by mouth daily. 04/13/19 07/12/19  O'NealCassie Freer, MD  temazepam (RESTORIL) 15 MG capsule Take 1 capsule (15 mg total) by mouth at bedtime as needed for sleep. 05/08/19   Raulkar, Clide Deutscher, MD  ticagrelor (BRILINTA) 90 MG TABS tablet Take 1 tablet (90 mg total) by mouth 2 (two) times daily. 03/31/19   Love, Ivan Anchors, PA-C  fluticasone (FLONASE) 50 MCG/ACT nasal spray Place 2 sprays into the nose daily. Patient not taking: Reported on 03/10/2014 03/08/12 03/10/14  Robyn Haber, MD    Allergies      Azithromycin, Morphine and related, Nitrofurantoin, Penicillins, and Sulfonamide derivatives  Review of Systems   Review of Systems  All other systems reviewed and are negative.   Physical Exam Updated Vital Signs BP 113/78    Pulse 95    Temp (!) 103 F (39.4 C) (Oral)    Resp 18    Ht 5' 2"  (1.575 m)    Wt 79 kg    SpO2 100%    BMI 31.85 kg/m   Physical Exam Vitals and nursing note reviewed.  Constitutional:      General: She is in acute distress.     Appearance: Normal appearance.     Comments: Tearful  HENT:     Head: Normocephalic and atraumatic.     Right Ear: External ear normal.     Left Ear: External ear normal.  Eyes:     Extraocular Movements: Extraocular movements intact.     Conjunctiva/sclera: Conjunctivae normal.  Cardiovascular:     Rate and Rhythm: Normal rate and regular rhythm.  Pulmonary:     Effort: Pulmonary effort is normal. No respiratory distress.     Breath sounds: Normal breath sounds.  Abdominal:     Tenderness: There is abdominal tenderness. There is no guarding or rebound.    Musculoskeletal:        General: Normal range of motion.     Cervical back: Normal range of motion.  Skin:    General: Skin is warm and dry.     Findings: No erythema.     Comments: When I examined patient's groin there is no obvious redness to the skin or abnormality.  When I palpate her inguinal area I do not feel any masses or induration or firmness.  She has some mild tenderness of the mons however she is extremely tender when I palpate her right lower quadrant in the abdomen.  I had her  bend her knee and tapped her knee which did not cause pain.  I checked for psoas sign and that was mildly uncomfortable.  Neurological:     Mental Status: She is alert and oriented to person, place, and time. Mental status is at baseline.     Cranial Nerves: No cranial nerve deficit.  Psychiatric:        Mood and Affect: Mood is anxious.        Speech: Speech normal.         Behavior: Behavior normal.     ED Results / Procedures / Treatments   Labs (all labs ordered are listed, but only abnormal results are displayed) Results for orders placed or performed during the hospital encounter of 11/20/19  Comprehensive metabolic panel  Result Value Ref Range   Sodium 138 135 - 145 mmol/L   Potassium 3.6 3.5 - 5.1 mmol/L   Chloride 102 98 - 111 mmol/L   CO2 23 22 - 32 mmol/L   Glucose, Bld 191 (H) 70 - 99 mg/dL   BUN 10 6 - 20 mg/dL   Creatinine, Ser 0.66 0.44 - 1.00 mg/dL   Calcium 9.5 8.9 - 10.3 mg/dL   Total Protein 9.2 (H) 6.5 - 8.1 g/dL   Albumin 4.2 3.5 - 5.0 g/dL   AST 19 15 - 41 U/L   ALT 26 0 - 44 U/L   Alkaline Phosphatase 79 38 - 126 U/L   Total Bilirubin 0.7 0.3 - 1.2 mg/dL   GFR calc non Af Amer >60 >60 mL/min   GFR calc Af Amer >60 >60 mL/min   Anion gap 13 5 - 15  CBC with Differential  Result Value Ref Range   WBC 14.5 (H) 4.0 - 10.5 K/uL   RBC 5.20 (H) 3.87 - 5.11 MIL/uL   Hemoglobin 15.3 (H) 12.0 - 15.0 g/dL   HCT 45.6 36 - 46 %   MCV 87.7 80.0 - 100.0 fL   MCH 29.4 26.0 - 34.0 pg   MCHC 33.6 30.0 - 36.0 g/dL   RDW 13.9 11.5 - 15.5 %   Platelets 245 150 - 400 K/uL   nRBC 0.0 0.0 - 0.2 %   Neutrophils Relative % 82 %   Neutro Abs 12.0 (H) 1.7 - 7.7 K/uL   Lymphocytes Relative 10 %   Lymphs Abs 1.4 0.7 - 4.0 K/uL   Monocytes Relative 6 %   Monocytes Absolute 0.8 0 - 1 K/uL   Eosinophils Relative 0 %   Eosinophils Absolute 0.1 0 - 0 K/uL   Basophils Relative 1 %   Basophils Absolute 0.1 0 - 0 K/uL   Immature Granulocytes 1 %   Abs Immature Granulocytes 0.09 (H) 0.00 - 0.07 K/uL   Laboratory interpretation all normal except leukocytosis, elevated hemoglobin consistent with dehydration, hyperglycemia without increased anion gap    EKG None  Radiology CT Abdomen Pelvis W Contrast  Result Date: 11/20/2019 CLINICAL DATA:  Right groin pain and swelling. Right lower quadrant abdominal pain. EXAM: CT ABDOMEN AND PELVIS WITH  CONTRAST TECHNIQUE: Multidetector CT imaging of the abdomen and pelvis was performed using the standard protocol following bolus administration of intravenous contrast. CONTRAST:  124m OMNIPAQUE IOHEXOL 300 MG/ML  SOLN COMPARISON:  CT scan 12/19/2015 FINDINGS: Lower chest: The lung bases are clear of acute process. No pleural effusion or pulmonary lesions. The heart is normal in size. No pericardial effusion. The distal esophagus and aorta are unremarkable. Hepatobiliary: No hepatic lesions or intrahepatic biliary dilatation.  Possible small gallstones. No findings for acute cholecystitis. No common bile duct dilatation. Pancreas: No mass, inflammation or ductal dilatation. Spleen: Normal size. No focal lesions. Adrenals/Urinary Tract: The adrenal glands and kidneys are unremarkable. No renal, ureteral or bladder calculi or mass. No significant collecting system abnormalities are identified on the delayed images. Stomach/Bowel: The stomach demonstrates stable postoperative changes from probable gastric stapling procedure. No complicating features. The duodenum, small bowel and colon are grossly normal. No acute inflammatory changes, mass lesions or obstructive findings. The terminal ileum is normal. The appendix is normal. Vascular/Lymphatic: The aorta is normal in caliber. No dissection. The branch vessels are patent. The major venous structures are patent. No mesenteric or retroperitoneal mass or adenopathy. Small scattered lymph nodes are noted. Reproductive: Surgically absent. Other: There is a slightly larger left inguinal hernia containing small bowel loops but no findings for incarceration or obstruction. Inflammatory process noted in the right inguinal region. Moderate interstitial changes/inflammation/edema without discrete fluid collection/abscess. There are also enlarged right inguinal and femoral lymph nodes. Right inguinal node on image 87/2 measures 14 mm and right external iliac/femoral lymph node on  image 80/2 measures 10.5 mm. The inguinal vessels appear normal. No evidence for venous thrombosis. Musculoskeletal: No significant bony findings. IMPRESSION: 1. Nonspecific inflammatory process in the right inguinal region without discrete fluid collection/abscess. There are also enlarged right inguinal and femoral lymph nodes suggesting lymphadenitis. 2. No findings for venous thrombosis. 3. Slightly larger left inguinal hernia containing small bowel loops but no findings for incarceration or obstruction. 4. Stable postoperative changes from probable gastric stapling procedure. 5. Possible small gallstones. Electronically Signed   By: Marijo Sanes M.D.   On: 11/20/2019 07:22    Procedures Procedures (including critical care time)  Medications Ordered in ED Medications  clindamycin (CLEOCIN) IVPB 600 mg (600 mg Intravenous New Bag/Given 11/20/19 0743)  sodium chloride 0.9 % bolus 1,000 mL (0 mLs Intravenous Stopped 11/20/19 0636)  fentaNYL (SUBLIMAZE) injection 50 mcg (50 mcg Intravenous Given 11/20/19 0529)  ondansetron (ZOFRAN) injection 4 mg (4 mg Intravenous Given 11/20/19 0528)  iohexol (OMNIPAQUE) 300 MG/ML solution 100 mL (100 mLs Intravenous Contrast Given 11/20/19 0620)  sodium chloride 0.9 % bolus 1,000 mL (1,000 mLs Intravenous New Bag/Given 11/20/19 0727)  ketorolac (TORADOL) 30 MG/ML injection 15 mg (15 mg Intravenous Given 11/20/19 0728)  acetaminophen (TYLENOL) tablet 650 mg (650 mg Oral Given 11/20/19 0962)    ED Course  I have reviewed the triage vital signs and the nursing notes.  Pertinent labs & imaging results that were available during my care of the patient were reviewed by me and considered in my medical decision making (see chart for details).    MDM Rules/Calculators/A&P                          Patient's exam is highly suspicious for appendicitis.  When I review her chart she had a abscess in her left inguinal area that was drained before.  I do not feel anything that  feels like an abscess or subcutaneous induration in her groin or inguinal area.  There is no sign suggestive of cellulitis.  Patient had IV inserted and was given IV pain and nausea medication.  CT of the abdomen and pelvis was done to look for appendicitis.  Patient spiked a fever up to 103.  She was given oral acetaminophen and IV Toradol.  Patient was started on IV antibiotics.  I will talk to the  hospitalist about admission.  When I reviewed her cultures from 2014 she did have gram Positive cocci in pairs but they said there was no MRSA cultured.  7:46 AM Dr. Manuella Ghazi, hospitalist will admit  Final Clinical Impression(s) / ED Diagnoses Final diagnoses:  RLQ abdominal pain  Cellulitis, unspecified cellulitis site  Fever, unspecified fever cause    Rx / DC Orders  Plan admission  Rolland Porter, MD, Barbette Or, MD 11/20/19 [phone]

## 2019-11-20 NOTE — ED Notes (Signed)
Pt with low BP. Dr Manuella Ghazi notified. Ordered bolus. If pt BP comes up greater than can go to 300 if not will need a step down bed

## 2019-11-21 DIAGNOSIS — A419 Sepsis, unspecified organism: Secondary | ICD-10-CM | POA: Diagnosis not present

## 2019-11-21 DIAGNOSIS — L03115 Cellulitis of right lower limb: Secondary | ICD-10-CM | POA: Diagnosis not present

## 2019-11-21 DIAGNOSIS — L0390 Cellulitis, unspecified: Secondary | ICD-10-CM

## 2019-11-21 LAB — CBC
HCT: 39.3 % (ref 36.0–46.0)
Hemoglobin: 12.4 g/dL (ref 12.0–15.0)
MCH: 28 pg (ref 26.0–34.0)
MCHC: 31.6 g/dL (ref 30.0–36.0)
MCV: 88.7 fL (ref 80.0–100.0)
Platelets: 234 10*3/uL (ref 150–400)
RBC: 4.43 MIL/uL (ref 3.87–5.11)
RDW: 14.6 % (ref 11.5–15.5)
WBC: 28.4 10*3/uL — ABNORMAL HIGH (ref 4.0–10.5)
nRBC: 0 % (ref 0.0–0.2)

## 2019-11-21 LAB — GLUCOSE, CAPILLARY
Glucose-Capillary: 94 mg/dL (ref 70–99)
Glucose-Capillary: 143 mg/dL — ABNORMAL HIGH (ref 70–99)
Glucose-Capillary: 163 mg/dL — ABNORMAL HIGH (ref 70–99)
Glucose-Capillary: 106 mg/dL — ABNORMAL HIGH (ref 70–99)

## 2019-11-21 LAB — COMPREHENSIVE METABOLIC PANEL
ALT: 18 U/L (ref 0–44)
AST: 14 U/L — ABNORMAL LOW (ref 15–41)
Albumin: 3.1 g/dL — ABNORMAL LOW (ref 3.5–5.0)
Alkaline Phosphatase: 69 U/L (ref 38–126)
Anion gap: 10 (ref 5–15)
BUN: 12 mg/dL (ref 6–20)
CO2: 22 mmol/L (ref 22–32)
Calcium: 8.6 mg/dL — ABNORMAL LOW (ref 8.9–10.3)
Chloride: 105 mmol/L (ref 98–111)
Creatinine, Ser: 0.67 mg/dL (ref 0.44–1.00)
GFR calc Af Amer: >60 mL/min (ref >60)
GFR calc non Af Amer: >60 mL/min (ref >60)
Glucose, Bld: 107 mg/dL — ABNORMAL HIGH (ref 70–99)
Potassium: 3.6 mmol/L (ref 3.5–5.1)
Sodium: 137 mmol/L (ref 135–145)
Total Bilirubin: 1.3 mg/dL — ABNORMAL HIGH (ref 0.3–1.2)
Total Protein: 7.3 g/dL (ref 6.5–8.1)

## 2019-11-21 LAB — MAGNESIUM: Magnesium: 1.5 mg/dL — ABNORMAL LOW (ref 1.7–2.4)

## 2019-11-21 LAB — LACTIC ACID, PLASMA: Lactic Acid, Venous: 1.5 mmol/L (ref 0.5–1.9)

## 2019-11-21 MED ORDER — MAGNESIUM SULFATE 2 GM/50ML IV SOLN
2.0000 g | Freq: Once | INTRAVENOUS | Status: AC
  Administered 2019-11-21: 2 g via INTRAVENOUS
  Filled 2019-11-21: qty 50

## 2019-11-21 NOTE — Progress Notes (Signed)
Subjective: Patient still reports right groin pain, though it seems to be slightly less.  She is hungry.  Objective: Vital Lopez in last 24 hours: Temp:  [98.1 F (36.7 C)-101.5 F (38.6 C)] 98.4 F (36.9 C) (08/14 0453) Pulse Rate:  [83-106] 87 (08/14 0453) Resp:  [16-20] 16 (08/14 0453) BP: (80-107)/(54-74) 94/59 (08/14 0453) SpO2:  [91 %-100 %] 97 % (08/14 0453) Weight:  [82.5 kg] 82.5 kg (08/13 2118)    Intake/Output from previous day: 08/13 0701 - 08/14 0700 In: 1691.4 [IV Piggyback:1691.4] Out: 200 [Urine:200] Intake/Output this shift: No intake/output data recorded.  General appearance: alert, cooperative and no distress GI: soft, non-tender; bowel sounds normal; no masses,  no organomegaly Right groin region with less induration present, though still tender.  I do not feel a specific fluctuant area, but this is somewhat limited secondary to her body habitus.  Her right leg has no evidence of arthropod bites, cellulitis, or trauma.  Lab Results:  Recent Labs    11/20/19 0509 11/21/19 0601  WBC 14.5* 28.4*  HGB 15.3* 12.4  HCT 45.6 39.3  PLT 245 234   BMET Recent Labs    11/20/19 0509 11/21/19 0601  NA 138 137  K 3.6 3.6  CL 102 105  CO2 23 22  GLUCOSE 191* 107*  BUN 10 12  CREATININE 0.66 0.67  CALCIUM 9.5 8.6*   PT/INR No results for input(s): LABPROT, INR in the last 72 hours.  Studies/Results: CT Abdomen Pelvis W Contrast  Result Date: 11/20/2019 CLINICAL DATA:  Right groin pain and swelling. Right lower quadrant abdominal pain. EXAM: CT ABDOMEN AND PELVIS WITH CONTRAST TECHNIQUE: Multidetector CT imaging of the abdomen and pelvis was performed using the standard protocol following bolus administration of intravenous contrast. CONTRAST:  149mL OMNIPAQUE IOHEXOL 300 MG/ML  SOLN COMPARISON:  CT scan 12/19/2015 FINDINGS: Lower chest: The lung bases are clear of acute process. No pleural effusion or pulmonary lesions. The heart is normal in size. No  pericardial effusion. The distal esophagus and aorta are unremarkable. Hepatobiliary: No hepatic lesions or intrahepatic biliary dilatation. Possible small gallstones. No findings for acute cholecystitis. No common bile duct dilatation. Pancreas: No mass, inflammation or ductal dilatation. Spleen: Normal size. No focal lesions. Adrenals/Urinary Tract: The adrenal glands and kidneys are unremarkable. No renal, ureteral or bladder calculi or mass. No significant collecting system abnormalities are identified on the delayed images. Stomach/Bowel: The stomach demonstrates stable postoperative changes from probable gastric stapling procedure. No complicating features. The duodenum, small bowel and colon are grossly normal. No acute inflammatory changes, mass lesions or obstructive findings. The terminal ileum is normal. The appendix is normal. Vascular/Lymphatic: The aorta is normal in caliber. No dissection. The branch vessels are patent. The major venous structures are patent. No mesenteric or retroperitoneal mass or adenopathy. Small scattered lymph nodes are noted. Reproductive: Surgically absent. Other: There is a slightly larger left inguinal hernia containing small bowel loops but no findings for incarceration or obstruction. Inflammatory process noted in the right inguinal region. Moderate interstitial changes/inflammation/edema without discrete fluid collection/abscess. There are also enlarged right inguinal and femoral lymph nodes. Right inguinal node on image 87/2 measures 14 mm and right external iliac/femoral lymph node on image 80/2 measures 10.5 mm. The inguinal vessels appear normal. No evidence for venous thrombosis. Musculoskeletal: No significant bony findings. IMPRESSION: 1. Nonspecific inflammatory process in the right inguinal region without discrete fluid collection/abscess. There are also enlarged right inguinal and femoral lymph nodes suggesting lymphadenitis. 2. No findings  for venous  thrombosis. 3. Slightly larger left inguinal hernia containing small bowel loops but no findings for incarceration or obstruction. 4. Stable postoperative changes from probable gastric stapling procedure. 5. Possible small gallstones. Electronically Signed   By: Marijo Sanes M.D.   On: 11/20/2019 07:22    Anti-infectives: Anti-infectives (From admission, onward)   Start     Dose/Rate Route Frequency Ordered Stop   11/21/19 0100  vancomycin (VANCOCIN) IVPB 1000 mg/200 mL premix     Discontinue     1,000 mg 200 mL/hr over 60 Minutes Intravenous Every 12 hours 11/20/19 1416     11/20/19 1600  vancomycin (VANCOCIN) IVPB 1000 mg/200 mL premix        1,000 mg 200 mL/hr over 60 Minutes Intravenous  Once 11/20/19 1415 11/20/19 1702   11/20/19 1330  vancomycin (VANCOCIN) IVPB 1000 mg/200 mL premix        1,000 mg 200 mL/hr over 60 Minutes Intravenous  Once 11/20/19 1323 11/20/19 1450   11/20/19 0745  clindamycin (CLEOCIN) IVPB 600 mg        600 mg 100 mL/hr over 30 Minutes Intravenous  Once 11/20/19 0731 11/20/19 0813      Assessment/Plan: Impression: Right groin lymphadenitis of unknown etiology.  Would continue IV vancomycin.  May need additional imaging by ultrasound to assess for an abscess.  Will reassess in the morning.  Discussed with Dr. Manuella Ghazi.  LOS: 1 day    Ann Lopez 11/21/2019

## 2019-11-21 NOTE — Progress Notes (Signed)
PROGRESS NOTE    Ann Lopez  WGN:[id number] DOB: 03/17/1971 DOA: 11/20/2019 PCP: Hayden Rasmussen, MD   Brief Narrative:  Per HPI: Ann Lopez is a 49 y.o. female with medical history significant for prior right MCA CVA, right ICA stent, hypertension, dyslipidemia, diabetes, obesity, and anxiety who presented to the ED with worsening right-sided groin pain over the last several days.  She states that this began Friday of last week and initially started with lack of appetite as well as some nausea and vomiting.  She states that the pain in her groin became worse and she went to see her PCP who noted possible infected lymph node on the right side and prescribed her oral doxycycline.  She apparently had just finished a course of doxycycline for an infection on her finger as well.  She states that her pain began to worsen and she developed a fever which prompted the ED visit.  She was also noted to have some chills.  -Patient was admitted with sepsis secondary to nonpurulent cellulitis/lymphadenitis of the right groin.  She was given IV clindamycin in the ED and then subsequently switched to IV vancomycin for ongoing treatment.  8/14: Patient continues to have right-sided groin pain, but it appears improved compared to yesterday.  Seen by general surgery with plans to continue antibiotics for now and consider imaging as needed by a.m.  She was noted to have some hypotension yesterday which has improved.  Assessment & Plan:   Active Problems:   Sepsis ([place])   Cellulitis   Sepsis secondary to nonpurulent cellulitis/lymphadenitis of right groin -Noted to have leukocytosis as well as temperature elevation -Unknown etiology of lymphadenitis -Continue antibiotic coverage as prescribed with vancomycin -Continue to monitor repeat CBC and fever curve -Tylenol as needed for fever -Diet advanced to heart healthy on 8/14 -Appreciate general surgery evaluation with possible need for imaging in  a.m.  Mild hypomagnesemia -Replete and reevaluate in a.m.  Type 2 diabetes with hyperglycemia-improved -Continue home long-acting insulin at half dose until diet can be tolerated -Hold home Invokana -SSI -Hemoglobin A1c 8.4%  Prior right MCA CVA/dyslipidemia -With noted prior right ICA stent -Continue on statin -Continue aspirin and Brilinta  History of hypertension -Noted to actually have some hypotension which is not unusual for her-stable -Continue to monitor  Anxiety -Continue home medications  GERD -PPI  Obesity -Lifestyle changes   DVT prophylaxis: Lovenox Code Status: Full code Family Communication: None at bedside Disposition Plan:   Status is: Inpatient  Remains inpatient appropriate because:IV treatments appropriate due to intensity of illness or inability to take PO and Inpatient level of care appropriate due to severity of illness   Dispo: The patient is from: Home              Anticipated d/c is to: Home              Anticipated d/c date is: 2 days              Patient currently is not medically stable to d/c.  She requires continued monitoring and IV antibiotics.  Consultants:   General surgery  Procedures:   See below  Antimicrobials:  Anti-infectives (From admission, onward)   Start     Dose/Rate Route Frequency Ordered Stop   11/21/19 0100  vancomycin (VANCOCIN) IVPB 1000 mg/200 mL premix     Discontinue     1,000 mg 200 mL/hr over 60 Minutes Intravenous Every 12 hours 11/20/19 1416  11/20/19 1600  vancomycin (VANCOCIN) IVPB 1000 mg/200 mL premix        1,000 mg 200 mL/hr over 60 Minutes Intravenous  Once 11/20/19 1415 11/20/19 1702   11/20/19 1330  vancomycin (VANCOCIN) IVPB 1000 mg/200 mL premix        1,000 mg 200 mL/hr over 60 Minutes Intravenous  Once 11/20/19 1323 11/20/19 1450   11/20/19 0745  clindamycin (CLEOCIN) IVPB 600 mg        600 mg 100 mL/hr over 30 Minutes Intravenous  Once 11/20/19 0731 11/20/19 0813        Subjective: Patient seen and evaluated today with no new acute complaints or concerns. No acute concerns or events noted overnight.  She reports that she would like to have a full diet.  States that her arm blood pressure readings are typically soft at home.  Right groin pain continues to be present, but minimally improved.  Objective: Vitals:   11/20/19 2000 11/20/19 2118 11/21/19 0103 11/21/19 0453  BP: 107/74 (!) 88/54 (!) 84/57 (!) 94/59  Pulse: 91 93 85 87  Resp:  18 16 16   Temp:  98.4 F (36.9 C) 98.1 F (36.7 C) 98.4 F (36.9 C)  TempSrc:  Oral Oral Oral  SpO2: 99% 96% 97% 97%  Weight:  82.5 kg    Height:  5\' 2"  (1.575 m)      Intake/Output Summary (Last 24 hours) at 11/21/2019 1132 Last data filed at 11/21/2019 0300 Gross per 24 hour  Intake 591.39 ml  Output 200 ml  Net 391.39 ml   Filed Weights   11/20/19 0445 11/20/19 2118  Weight: 79 kg 82.5 kg    Examination:  General exam: Appears calm and comfortable  Respiratory system: Clear to auscultation. Respiratory effort normal. Cardiovascular system: S1 & S2 heard, RRR.  Gastrointestinal system: Abdomen is soft.  Right groin area warm and tender to palpation.  No erythema Central nervous system: Alert and oriented. No focal neurological deficits. Extremities: No edema Skin: No rashes, lesions or ulcers Psychiatry: Judgement and insight appear normal. Mood & affect appropriate.     Data Reviewed: I have personally reviewed following labs and imaging studies  CBC: Recent Labs  Lab 11/20/19 0509 11/21/19 0601  WBC 14.5* 28.4*  NEUTROABS 12.0*  --   HGB 15.3* 12.4  HCT 45.6 39.3  MCV 87.7 88.7  PLT 245 149   Basic Metabolic Panel: Recent Labs  Lab 11/20/19 0509 11/21/19 0601  NA 138 137  K 3.6 3.6  CL 102 105  CO2 23 22  GLUCOSE 191* 107*  BUN 10 12  CREATININE 0.66 0.67  CALCIUM 9.5 8.6*  MG  --  1.5*   GFR: Estimated Creatinine Clearance: 86.6 mL/min (by C-G formula based on SCr of  0.67 mg/dL). Liver Function Tests: Recent Labs  Lab 11/20/19 0509 11/21/19 0601  AST 19 14*  ALT 26 18  ALKPHOS 79 69  BILITOT 0.7 1.3*  PROT 9.2* 7.3  ALBUMIN 4.2 3.1*   No results for input(s): LIPASE, AMYLASE in the last 168 hours. No results for input(s): AMMONIA in the last 168 hours. Coagulation Profile: No results for input(s): INR, PROTIME in the last 168 hours. Cardiac Enzymes: No results for input(s): CKTOTAL, CKMB, CKMBINDEX, TROPONINI in the last 168 hours. BNP (last 3 results) No results for input(s): PROBNP in the last 8760 hours. HbA1C: Recent Labs    11/20/19 0509  HGBA1C 8.4*   CBG: Recent Labs  Lab 11/20/19 1711 11/20/19 2103  11/21/19 0754 11/21/19 1130  GLUCAP 183* 107* 94 143*   Lipid Profile: No results for input(s): CHOL, HDL, LDLCALC, TRIG, CHOLHDL, LDLDIRECT in the last 72 hours. Thyroid Function Tests: No results for input(s): TSH, T4TOTAL, FREET4, T3FREE, THYROIDAB in the last 72 hours. Anemia Panel: No results for input(s): VITAMINB12, FOLATE, FERRITIN, TIBC, IRON, RETICCTPCT in the last 72 hours. Sepsis Labs: Recent Labs  Lab 11/21/19 0950  LATICACIDVEN 1.5    Recent Results (from the past 240 hour(s))  SARS Coronavirus 2 by RT PCR (hospital order, performed in [facility] hospital lab) Nasopharyngeal Nasopharyngeal Swab     Status: None   Collection Time: 11/20/19  6:32 AM   Specimen: Nasopharyngeal Swab  Result Value Ref Range Status   SARS Coronavirus 2 NEGATIVE NEGATIVE Final    Comment: (NOTE) SARS-CoV-2 target nucleic acids are NOT DETECTED.  The SARS-CoV-2 RNA is generally detectable in upper and lower respiratory specimens during the acute phase of infection. The lowest concentration of SARS-CoV-2 viral copies this assay can detect is 250 copies / mL. A negative result does not preclude SARS-CoV-2 infection and should not be used as the sole basis for treatment or other patient management decisions.  A negative result  may occur with improper specimen collection / handling, submission of specimen other than nasopharyngeal swab, presence of viral mutation(s) within the areas targeted by this assay, and inadequate number of viral copies (<250 copies / mL). A negative result must be combined with clinical observations, patient history, and epidemiological information.  Fact Sheet for Patients:   StrictlyIdeas.no  Fact Sheet for Healthcare Providers: BankingDealers.co.za  This test is not yet approved or  cleared by the [place] FDA and has been authorized for detection and/or diagnosis of SARS-CoV-2 by FDA under an Emergency Use Authorization (EUA).  This EUA will remain in effect (meaning this test can be used) for the duration of the COVID-19 declaration under Section 564(b)(1) of the Act, 21 U.S.C. section 360bbb-3(b)(1), unless the authorization is terminated or revoked sooner.  Performed at [facility], [address] [place], [place] [address]   MRSA PCR Screening     Status: None   Collection Time: 11/20/19  1:04 PM   Specimen: Nasopharyngeal  Result Value Ref Range Status   MRSA by PCR NEGATIVE NEGATIVE Final    Comment:        The GeneXpert MRSA Assay (FDA approved for NASAL specimens only), is one component of a comprehensive MRSA colonization surveillance program. It is not intended to diagnose MRSA infection nor to guide or monitor treatment for MRSA infections. Performed at [facility], [address] [place], [place] [address]   Culture, blood (routine x 2)     Status: None (Preliminary result)   Collection Time: 11/20/19  2:10 PM   Specimen: Right Antecubital; Blood  Result Value Ref Range Status   Specimen Description   Final    RIGHT ANTECUBITAL BOTTLES DRAWN AEROBIC AND ANAEROBIC   Special Requests   Final    Blood Culture results may not be optimal due to an inadequate volume of blood received in culture bottles    Culture   Final    NO GROWTH < 24 HOURS Performed at [facility], [address], [place],  [address]    Report Status PENDING  Incomplete  Culture, blood (routine x 2)     Status: None (Preliminary result)   Collection Time: 11/20/19  2:17 PM   Specimen: BLOOD RIGHT HAND  Result Value  Ref Range Status   Specimen Description   Final    BLOOD RIGHT HAND BOTTLES DRAWN AEROBIC AND ANAEROBIC   Special Requests   Final    Blood Culture results may not be optimal due to an inadequate volume of blood received in culture bottles   Culture   Final    NO GROWTH < 24 HOURS Performed at [facility], [address] [place], [place] [address]    Report Status PENDING  Incomplete         Radiology Studies: CT Abdomen Pelvis W Contrast  Result Date: 11/20/2019 CLINICAL DATA:  Right groin pain and swelling. Right lower quadrant abdominal pain. EXAM: CT ABDOMEN AND PELVIS WITH CONTRAST TECHNIQUE: Multidetector CT imaging of the abdomen and pelvis was performed using the standard protocol following bolus administration of intravenous contrast. CONTRAST:  152mL OMNIPAQUE IOHEXOL 300 MG/ML  SOLN COMPARISON:  CT scan 12/19/2015 FINDINGS: Lower chest: The lung bases are clear of acute process. No pleural effusion or pulmonary lesions. The heart is normal in size. No pericardial effusion. The distal esophagus and aorta are unremarkable. Hepatobiliary: No hepatic lesions or intrahepatic biliary dilatation. Possible small gallstones. No findings for acute cholecystitis. No common bile duct dilatation. Pancreas: No mass, inflammation or ductal dilatation. Spleen: Normal size. No focal lesions. Adrenals/Urinary Tract: The adrenal glands and kidneys are unremarkable. No renal, ureteral or bladder calculi or mass. No significant collecting system abnormalities are identified on the delayed images. Stomach/Bowel: The stomach demonstrates stable postoperative changes from probable gastric stapling procedure. No  complicating features. The duodenum, small bowel and colon are grossly normal. No acute inflammatory changes, mass lesions or obstructive findings. The terminal ileum is normal. The appendix is normal. Vascular/Lymphatic: The aorta is normal in caliber. No dissection. The branch vessels are patent. The major venous structures are patent. No mesenteric or retroperitoneal mass or adenopathy. Small scattered lymph nodes are noted. Reproductive: Surgically absent. Other: There is a slightly larger left inguinal hernia containing small bowel loops but no findings for incarceration or obstruction. Inflammatory process noted in the right inguinal region. Moderate interstitial changes/inflammation/edema without discrete fluid collection/abscess. There are also enlarged right inguinal and femoral lymph nodes. Right inguinal node on image 87/2 measures 14 mm and right external iliac/femoral lymph node on image 80/2 measures 10.5 mm. The inguinal vessels appear normal. No evidence for venous thrombosis. Musculoskeletal: No significant bony findings. IMPRESSION: 1. Nonspecific inflammatory process in the right inguinal region without discrete fluid collection/abscess. There are also enlarged right inguinal and femoral lymph nodes suggesting lymphadenitis. 2. No findings for venous thrombosis. 3. Slightly larger left inguinal hernia containing small bowel loops but no findings for incarceration or obstruction. 4. Stable postoperative changes from probable gastric stapling procedure. 5. Possible small gallstones. Electronically Signed   By: Marijo Sanes M.D.   On: 11/20/2019 07:22        Scheduled Meds: . aspirin EC  81 mg Oral Daily  . cariprazine  1.5 mg Oral Daily  . DULoxetine  60 mg Oral Daily  . enoxaparin (LOVENOX) injection  40 mg Subcutaneous Q24H  . gabapentin  300 mg Oral QHS  . icosapent Ethyl  2 g Oral BID  . insulin aspart  0-15 Units Subcutaneous TID WC  . insulin aspart  0-5 Units Subcutaneous QHS    . insulin glargine  10 Units Subcutaneous Daily  . loratadine  10 mg Oral Daily  . montelukast  10 mg Oral Daily  . pantoprazole  40 mg Oral  Daily  . potassium chloride  10 mEq Oral Daily  . rosuvastatin  40 mg Oral Daily  . ticagrelor  90 mg Oral Daily   Continuous Infusions: . vancomycin 1,000 mg (11/21/19 0059)     LOS: 1 day    Time spent: 35 minutes    Paislynn Hegstrom D Manuella Ghazi, DO [facility]  If 7PM-7AM, please contact night-coverage [email or web address] 11/21/2019, 11:32 AM

## 2019-11-22 ENCOUNTER — Inpatient Hospital Stay (HOSPITAL_COMMUNITY): Payer: Medicare Other

## 2019-11-22 DIAGNOSIS — L03314 Cellulitis of groin: Secondary | ICD-10-CM | POA: Diagnosis not present

## 2019-11-22 LAB — CBC
HCT: 34.9 % — ABNORMAL LOW (ref 36.0–46.0)
Hemoglobin: 11.2 g/dL — ABNORMAL LOW (ref 12.0–15.0)
MCH: 28.3 pg (ref 26.0–34.0)
MCHC: 32.1 g/dL (ref 30.0–36.0)
MCV: 88.1 fL (ref 80.0–100.0)
Platelets: 252 10*3/uL (ref 150–400)
RBC: 3.96 MIL/uL (ref 3.87–5.11)
RDW: 14.3 % (ref 11.5–15.5)
WBC: 27.9 10*3/uL — ABNORMAL HIGH (ref 4.0–10.5)
nRBC: 0 % (ref 0.0–0.2)

## 2019-11-22 LAB — BASIC METABOLIC PANEL
Anion gap: 9 (ref 5–15)
BUN: 13 mg/dL (ref 6–20)
CO2: 22 mmol/L (ref 22–32)
Calcium: 8.6 mg/dL — ABNORMAL LOW (ref 8.9–10.3)
Chloride: 102 mmol/L (ref 98–111)
Creatinine, Ser: 0.79 mg/dL (ref 0.44–1.00)
GFR calc Af Amer: >60 mL/min (ref >60)
GFR calc non Af Amer: >60 mL/min (ref >60)
Glucose, Bld: 114 mg/dL — ABNORMAL HIGH (ref 70–99)
Potassium: 3.3 mmol/L — ABNORMAL LOW (ref 3.5–5.1)
Sodium: 133 mmol/L — ABNORMAL LOW (ref 135–145)

## 2019-11-22 LAB — GLUCOSE, CAPILLARY
Glucose-Capillary: 109 mg/dL — ABNORMAL HIGH (ref 70–99)
Glucose-Capillary: 121 mg/dL — ABNORMAL HIGH (ref 70–99)
Glucose-Capillary: 115 mg/dL — ABNORMAL HIGH (ref 70–99)
Glucose-Capillary: 92 mg/dL (ref 70–99)

## 2019-11-22 LAB — C-REACTIVE PROTEIN: CRP: 42.4 mg/dL — ABNORMAL HIGH (ref <1.0)

## 2019-11-22 LAB — LACTIC ACID, PLASMA: Lactic Acid, Venous: 1.3 mmol/L (ref 0.5–1.9)

## 2019-11-22 LAB — PROCALCITONIN: Procalcitonin: 2.37 ng/mL

## 2019-11-22 LAB — MAGNESIUM: Magnesium: 2.1 mg/dL (ref 1.7–2.4)

## 2019-11-22 LAB — SEDIMENTATION RATE: Sed Rate: 108 mm/hr — ABNORMAL HIGH (ref 0–22)

## 2019-11-22 MED ORDER — POTASSIUM CHLORIDE CRYS ER 20 MEQ PO TBCR
40.0000 meq | EXTENDED_RELEASE_TABLET | Freq: Once | ORAL | Status: AC
  Administered 2019-11-22: 40 meq via ORAL
  Filled 2019-11-22: qty 2

## 2019-11-22 MED ORDER — CHLORHEXIDINE GLUCONATE CLOTH 2 % EX PADS
6.0000 | MEDICATED_PAD | Freq: Once | CUTANEOUS | Status: AC
  Administered 2019-11-22: 6 via TOPICAL

## 2019-11-22 MED ORDER — VANCOMYCIN HCL IN DEXTROSE 1-5 GM/200ML-% IV SOLN: 1000.0000 mg | Freq: Once | INTRAVENOUS | Status: DC

## 2019-11-22 NOTE — Progress Notes (Signed)
PROGRESS NOTE    NANCYE GRUMBINE  WJX:[id number] DOB: [birth year] DOA: 11/20/2019 PCP: Hayden Rasmussen, MD   Brief Narrative:  Per HPI: Ann Lopez a 49 y.o.femalewith medical history significant forprior right MCA CVA, right ICA stent, hypertension, dyslipidemia, diabetes, obesity, and anxiety who presented to the ED with worsening right-sided groin pain over the last several days. She states that this began Friday of last week and initially started with lack of appetite as well as some nausea and vomiting. She states that the pain in her groin became worse and she went to see her PCP who noted possible infected lymph node on the right side and prescribed her oral doxycycline. She apparently had just finished a course of doxycycline for an infection on her finger as well. She states that her pain began to worsen and she developed a fever which prompted the ED visit. She was also noted to have some chills.  -Patient was admitted with sepsis secondary to nonpurulent cellulitis/lymphadenitis of the right groin.  She was given IV clindamycin in the ED and then subsequently switched to IV vancomycin for ongoing treatment.  8/14: Patient continues to have right-sided groin pain, but it appears improved compared to yesterday.  Seen by general surgery with plans to continue antibiotics for now and consider imaging as needed by a.m.  She was noted to have some hypotension yesterday which has improved.  8/15: Patient continues with right-sided groin pain that may be minimally improved.  She still has persistent leukocytosis.  She has recently had work-up for STDs done by her PCP as well as tickborne illness work-up.  Ultrasound without findings of abscess.  Assessment & Plan:   Active Problems:   Sepsis ([place])   Cellulitis   Cellulitis of groin, right   Sepsis secondary to nonpurulent cellulitis/lymphadenitis of right groin -Noted to have leukocytosis as well as temperature  elevation -Unknown etiology of lymphadenitis -Continue antibiotic coverage as prescribed with vancomycin -Continue to monitor repeat CBC and fever curve -Tylenol as needed for fever -Diet advanced to heart healthy on 8/14 -Appreciate general surgery evaluation with right groin ultrasound, no acute findings aside from 1.5 cm lymph node -Recent STD work-up performed and negative -Recent tickborne illness performed and negative  Mild hypokalemia -Replete and reevaluate in a.m.  Type 2 diabetes with hyperglycemia-improved -Continue home long-acting insulin at half dose until diet can be tolerated -Hold home Invokana -SSI -Hemoglobin A1c 8.4%  Prior right MCA CVA/dyslipidemia -With noted prior right ICA stent -Continue on statin -Continue aspirin and Brilinta  History of hypertension -Noted to actually have some hypotension which is not unusual for her-stable -Continue to monitor  Anxiety -Continue home medications  GERD -PPI  History of BRCA1 mutation and prior breast cancer -Status post prior hysterectomy and bilateral salpingo-oophorectomy -Status post prior bilateral mastectomy  Obesity -Lifestyle changes   DVT prophylaxis: Lovenox Code Status: Full code Family Communication: None at bedside Disposition Plan:   Status is: Inpatient  Remains inpatient appropriate because:IV treatments appropriate due to intensity of illness or inability to take PO and Inpatient level of care appropriate due to severity of illness   Dispo: The patient is from: Home  Anticipated d/c is to: Home  Anticipated d/c date is: 1-2 days  Patient currently is not medically stable to d/c.  She requires continued monitoring and IV antibiotics.  Consultants:   General surgery  Procedures:   See below  Antimicrobials:  Anti-infectives (From admission, onward)   Start  Dose/Rate Route Frequency Ordered Stop   11/21/19 0100   vancomycin (VANCOCIN) IVPB 1000 mg/200 mL premix     Discontinue     1,000 mg 200 mL/hr over 60 Minutes Intravenous Every 12 hours 11/20/19 1416     11/20/19 1600  vancomycin (VANCOCIN) IVPB 1000 mg/200 mL premix        1,000 mg 200 mL/hr over 60 Minutes Intravenous  Once 11/20/19 1415 11/20/19 1702   11/20/19 1330  vancomycin (VANCOCIN) IVPB 1000 mg/200 mL premix        1,000 mg 200 mL/hr over 60 Minutes Intravenous  Once 11/20/19 1323 11/20/19 1450   11/20/19 0745  clindamycin (CLEOCIN) IVPB 600 mg        600 mg 100 mL/hr over 30 Minutes Intravenous  Once 11/20/19 0731 11/20/19 0813       Subjective: Patient seen and evaluated today with no new acute complaints or concerns. No acute concerns or events noted overnight.  She continues to have some right groin pain noted.  Objective: Vitals:   11/21/19 2024 11/22/19 0452 11/22/19 0500 11/22/19 1330  BP: (!) 89/58 (!) 80/52 (!) 86/60 95/64  Pulse: 91 80  87  Resp: _0 Temp: 98.4 F (36.9 C) 98.8 F (37.1 C)  99.2 F (37.3 C)  TempSrc: Oral Oral    SpO2: 92% 96%  98%  Weight:      Height:       No intake or output data in the 24 hours ending 11/22/19 1439 Filed Weights   11/20/19 0445 11/20/19 2118  Weight: 79 kg 82.5 kg    Examination:  General exam: Appears calm and comfortable  Respiratory system: Clear to auscultation. Respiratory effort normal. Cardiovascular system: S1 & S2 heard, RRR.  Gastrointestinal system: Abdomen is soft.  Tender to palpation over right groin with palpable lymph node. Central nervous system: Alert and oriented. No focal neurological deficits. Extremities: Symmetric 5 x 5 power. Skin: No rashes, lesions or ulcers Psychiatry: Judgement and insight appear normal. Mood & affect appropriate.     Data Reviewed: I have personally reviewed following labs and imaging studies  CBC: Recent Labs  Lab 11/20/19 0509 11/21/19 0601 11/22/19 0804  WBC 14.5* 28.4* 27.9*  NEUTROABS 12.0*  --    --   HGB 15.3* 12.4 11.2*  HCT 45.6 39.3 34.9*  MCV 87.7 88.7 88.1  PLT 245 234 240   Basic Metabolic Panel: Recent Labs  Lab 11/20/19 0509 11/21/19 0601 11/22/19 0804  NA 138 137 133*  K 3.6 3.6 3.3*  CL 102 105 102  CO2 _1 GLUCOSE 191* 107* 114*  BUN _2 CREATININE 0.66 0.67 0.79  CALCIUM 9.5 8.6* 8.6*  MG  --  1.5* 2.1   GFR: Estimated Creatinine Clearance: 86.6 mL/min (by C-G formula based on SCr of 0.79 mg/dL). Liver Function Tests: Recent Labs  Lab 11/20/19 0509 11/21/19 0601  AST 19 14*  ALT 26 18  ALKPHOS 79 69  BILITOT 0.7 1.3*  PROT 9.2* 7.3  ALBUMIN 4.2 3.1*   No results for input(s): LIPASE, AMYLASE in the last 168 hours. No results for input(s): AMMONIA in the last 168 hours. Coagulation Profile: No results for input(s): INR, PROTIME in the last 168 hours. Cardiac Enzymes: No results for input(s): CKTOTAL, CKMB, CKMBINDEX, TROPONINI in the last 168 hours. BNP (last 3 results) No results for input(s): PROBNP in the last 8760 hours. HbA1C: Recent Labs    11/20/19  0509  HGBA1C 8.4*   CBG: Recent Labs  Lab 11/21/19 1130 11/21/19 1702 11/21/19 2056 11/22/19 0754 11/22/19 1150  GLUCAP 143* 163* 106* 109* 121*   Lipid Profile: No results for input(s): CHOL, HDL, LDLCALC, TRIG, CHOLHDL, LDLDIRECT in the last 72 hours. Thyroid Function Tests: No results for input(s): TSH, T4TOTAL, FREET4, T3FREE, THYROIDAB in the last 72 hours. Anemia Panel: No results for input(s): VITAMINB12, FOLATE, FERRITIN, TIBC, IRON, RETICCTPCT in the last 72 hours. Sepsis Labs: Recent Labs  Lab 11/21/19 0950 11/22/19 0804  PROCALCITON  --  2.37  LATICACIDVEN 1.5 1.3    Recent Results (from the past 240 hour(s))  SARS Coronavirus 2 by RT PCR (hospital order, performed in [facility] hospital lab) Nasopharyngeal Nasopharyngeal Swab     Status: None   Collection Time: 11/20/19  6:32 AM   Specimen: Nasopharyngeal Swab  Result Value Ref Range Status    SARS Coronavirus 2 NEGATIVE NEGATIVE Final    Comment: (NOTE) SARS-CoV-2 target nucleic acids are NOT DETECTED.  The SARS-CoV-2 RNA is generally detectable in upper and lower respiratory specimens during the acute phase of infection. The lowest concentration of SARS-CoV-2 viral copies this assay can detect is 250 copies / mL. A negative result does not preclude SARS-CoV-2 infection and should not be used as the sole basis for treatment or other patient management decisions.  A negative result may occur with improper specimen collection / handling, submission of specimen other than nasopharyngeal swab, presence of viral mutation(s) within the areas targeted by this assay, and inadequate number of viral copies (<250 copies / mL). A negative result must be combined with clinical observations, patient history, and epidemiological information.  Fact Sheet for Patients:   StrictlyIdeas.no  Fact Sheet for Healthcare Providers: BankingDealers.co.za  This test is not yet approved or  cleared by the [place] FDA and has been authorized for detection and/or diagnosis of SARS-CoV-2 by FDA under an Emergency Use Authorization (EUA).  This EUA will remain in effect (meaning this test can be used) for the duration of the COVID-19 declaration under Section 564(b)(1) of the Act, 21 U.S.C. section 360bbb-3(b)(1), unless the authorization is terminated or revoked sooner.  Performed at [facility], [address] [place], [place] [address]   MRSA PCR Screening     Status: None   Collection Time: 11/20/19  1:04 PM   Specimen: Nasopharyngeal  Result Value Ref Range Status   MRSA by PCR NEGATIVE NEGATIVE Final    Comment:        The GeneXpert MRSA Assay (FDA approved for NASAL specimens only), is one component of a comprehensive MRSA colonization surveillance program. It is not intended to diagnose MRSA infection nor to guide or monitor  treatment for MRSA infections. Performed at [facility], [address], [place], [place] [address]   Culture, blood (routine x 2)     Status: None (Preliminary result)   Collection Time: 11/20/19  2:10 PM   Specimen: Right Antecubital; Blood  Result Value Ref Range Status   Specimen Description   Final    RIGHT ANTECUBITAL BOTTLES DRAWN AEROBIC AND ANAEROBIC   Special Requests   Final    Blood Culture results may not be optimal due to an inadequate volume of blood received in culture bottles   Culture   Final    NO GROWTH < 24 HOURS Performed at [facility], [address], [place], [place] [address]    Report Status PENDING  Incomplete  Culture, blood (routine  x 2)     Status: None (Preliminary result)   Collection Time: 11/20/19  2:17 PM   Specimen: BLOOD RIGHT HAND  Result Value Ref Range Status   Specimen Description   Final    BLOOD RIGHT HAND BOTTLES DRAWN AEROBIC AND ANAEROBIC   Special Requests   Final    Blood Culture results may not be optimal due to an inadequate volume of blood received in culture bottles   Culture   Final    NO GROWTH < 24 HOURS Performed at [facility], [address], [place], [place] [address]    Report Status PENDING  Incomplete         Radiology Studies: [place] RT LOWER EXTREM LTD SOFT TISSUE NON VASCULAR  Result Date: 11/22/2019 CLINICAL DATA:  Cellulitis of the right groin. Evaluate for abscess. EXAM: ULTRASOUND RIGHT LOWER EXTREMITY LIMITED TECHNIQUE: Ultrasound examination of the lower extremity soft tissues was performed in the area of clinical concern. COMPARISON:  None. FINDINGS: No fluid collection or hematoma.  No solid or cystic mass. Prominent right inguinal lymph node measuring 1.5 cm in short axis with normal morphology. No significant increased Doppler flow. No other enlarged lymph nodes. IMPRESSION: 1. No right inguinal fluid collection to suggest an abscess. 2. Prominent right inguinal lymph node measuring 15 mm in short axis with  normal morphology likely reflecting a reactive lymph node. Electronically Signed   By: Kathreen Devoid   On: 11/22/2019 13:16        Scheduled Meds: . aspirin EC  81 mg Oral Daily  . cariprazine  1.5 mg Oral Daily  . DULoxetine  60 mg Oral Daily  . enoxaparin (LOVENOX) injection  40 mg Subcutaneous Q24H  . gabapentin  300 mg Oral QHS  . icosapent Ethyl  2 g Oral BID  . insulin aspart  0-15 Units Subcutaneous TID WC  . insulin aspart  0-5 Units Subcutaneous QHS  . insulin glargine  10 Units Subcutaneous Daily  . loratadine  10 mg Oral Daily  . montelukast  10 mg Oral Daily  . pantoprazole  40 mg Oral Daily  . potassium chloride  10 mEq Oral Daily  . rosuvastatin  40 mg Oral Daily  . ticagrelor  90 mg Oral Daily   Continuous Infusions: . vancomycin 1,000 mg (11/22/19 1242)     LOS: 2 days    Time spent: 40 minutes    Mitsuko Luera Darleen Crocker, DO [facility]  If 7PM-7AM, please contact night-coverage [email or web address] 11/22/2019, 2:39 PM

## 2019-11-22 NOTE — Progress Notes (Signed)
  Subjective: Still with right groin pain.  No abdominal pain is noted.  She does have a decreased appetite.  Objective: Vital signs in last 24 hours: Temp:  [98.4 F (36.9 C)-100.7 F (38.2 C)] 98.8 F (37.1 C) (08/15 0452) Pulse Rate:  [80-98] 80 (08/15 0452) Resp:  [16-20] 16 (08/15 0452) BP: (80-100)/(52-60) 86/60 (08/15 0500) SpO2:  [92 %-96 %] 96 % (08/15 0452) Last BM Date: 11/21/19  Intake/Output from previous day: No intake/output data recorded. Intake/Output this shift: No intake/output data recorded.  General appearance: alert, cooperative and no distress GI: soft, non-tender; bowel sounds normal; no masses,  no organomegaly and Decreased right groin induration noted with palpable lymphadenopathy present.  Lab Results:  Recent Labs    11/21/19 0601 11/22/19 0804  WBC 28.4* 27.9*  HGB 12.4 11.2*  HCT 39.3 34.9*  PLT 234 252   BMET Recent Labs    11/20/19 0509 11/21/19 0601  NA 138 137  K 3.6 3.6  CL 102 105  CO2 23 22  GLUCOSE 191* 107*  BUN 10 12  CREATININE 0.66 0.67  CALCIUM 9.5 8.6*   PT/INR No results for input(s): LABPROT, INR in the last 72 hours.  Studies/Results: No results found.  Anti-infectives: Anti-infectives (From admission, onward)   Start     Dose/Rate Route Frequency Ordered Stop   11/21/19 0100  vancomycin (VANCOCIN) IVPB 1000 mg/200 mL premix     Discontinue     1,000 mg 200 mL/hr over 60 Minutes Intravenous Every 12 hours 11/20/19 1416     11/20/19 1600  vancomycin (VANCOCIN) IVPB 1000 mg/200 mL premix        1,000 mg 200 mL/hr over 60 Minutes Intravenous  Once 11/20/19 1415 11/20/19 1702   11/20/19 1330  vancomycin (VANCOCIN) IVPB 1000 mg/200 mL premix        1,000 mg 200 mL/hr over 60 Minutes Intravenous  Once 11/20/19 1323 11/20/19 1450   11/20/19 0745  clindamycin (CLEOCIN) IVPB 600 mg        600 mg 100 mL/hr over 30 Minutes Intravenous  Once 11/20/19 0731 11/20/19 0813      Assessment/Plan: Impression: Right  groin lymphadenitis with increasing white blood cell count.  Etiology unknown. Plan: We will get ultrasound of right groin today to assess for an abscess.  Further management is pending those results.  LOS: 2 days    Aviva Signs 11/22/2019

## 2019-11-22 NOTE — Progress Notes (Signed)
.    Notified by Dr. Manuella Ghazi that the right inguinal lymph node biopsy is needed for diagnostic purposes.  I did explain to the patient the risks and benefits of undergoing a right inguinal lymph node biopsy.  These included infection, bleeding, and the possibility of wound breakdown.  She is scheduled to undergo right inguinal lymph node biopsy tomorrow.

## 2019-11-22 NOTE — H&P (View-Only) (Signed)
.    Notified by Dr. Manuella Ghazi that the right inguinal lymph node biopsy is needed for diagnostic purposes.  I did explain to the patient the risks and benefits of undergoing a right inguinal lymph node biopsy.  These included infection, bleeding, and the possibility of wound breakdown.  She is scheduled to undergo right inguinal lymph node biopsy tomorrow.

## 2019-11-22 NOTE — TOC Initial Note (Signed)
Transition of Care [facility]) - Initial/Assessment Note    Patient Details  Name: Ann Lopez MRN: [id number] Date of Birth: 1971/01/17  Transition of Care [facility]) CM/SW Contact:    Natasha Bence, LCSW Phone Number: 11/22/2019, 3:24 PM  Clinical Narrative:                 Patient is a 49 year old female admitted for Sepsis. Patient reported that she is normally ambulatory within the home setting and does not require assistance. Patient reported that she would not be agreeable to SNF placement, but would accept HHPT if it was recommended. Patient expressed that she is familiar with [facility] and would be agreeable working with the company if PT were recommended. Patient also reported that she currently has a walker, wheelchair, and cane at home from a previous injury. TOC to follow.    Barriers to Discharge: Continued Medical Work up   Patient Goals and CMS Choice        Expected Discharge Plan and Services         Living arrangements for the past 2 months: Single Family Home                                      Prior Living Arrangements/Services Living arrangements for the past 2 months: Single Family Home   Patient language and need for interpreter reviewed:: Yes Do you feel safe going back to the place where you live?: Yes      Need for Family Participation in Patient Care: No (Comment) Care giver support system in place?: No (comment) Current home services: DME (Walker, wheel chair, cane) Criminal Activity/Legal Involvement Pertinent to Current Situation/Hospitalization: No - Comment as needed  Activities of Daily Living Home Assistive Devices/Equipment: Walker (specify type) ADL Screening (condition at time of admission) Patient's cognitive ability adequate to safely complete daily activities?: Yes Is the patient deaf or have difficulty hearing?: No Does the patient have difficulty seeing, even when wearing glasses/contacts?: No Does the patient have difficulty  concentrating, remembering, or making decisions?: No Patient able to express need for assistance with ADLs?: Yes Does the patient have difficulty dressing or bathing?: No Independently performs ADLs?: Yes (appropriate for developmental age) Does the patient have difficulty walking or climbing stairs?: Yes Weakness of Legs: Left Weakness of Arms/Hands: Left  Permission Sought/Granted Permission sought to share information with : Family Supports Permission granted to share information with : Yes, Verbal Permission Granted     Permission granted to share info w AGENCY: [place] agency  Permission granted to share info w Relationship: Pt     Emotional Assessment     Affect (typically observed): Accepting Orientation: : Oriented to Self, Oriented to Place, Oriented to  Time, Oriented to Situation Alcohol / Substance Use: Not Applicable Psych Involvement: No (comment)  Admission diagnosis:  RLQ abdominal pain [R10.31] Sepsis ([place]) [A41.9] Fever, unspecified fever cause [R50.9] Cellulitis, unspecified cellulitis site [L03.90] Patient Active Problem List   Diagnosis Date Noted  . Cellulitis of groin, right   . Cellulitis   . Sepsis ([place]) 11/20/2019  . Acute inguinal lymphadenitis   . Vascular headache   . Other chronic pain   . Uncontrolled type 2 diabetes mellitus with hyperglycemia ([place])   . Labile blood glucose   . Panic disorder with agoraphobia and moderate panic attacks   . Major depressive disorder, recurrent episode, moderate ([place])   .  MCI (mild cognitive impairment) with memory loss   . Acute ischemic right middle cerebral artery (MCA) stroke ([place]) 03/09/2019  . Left hemiparesis ([place])   . Bradycardia   . Tachypnea   . SIRS (systemic inflammatory response syndrome) ([facility])   . Hypokalemia   . Diabetes mellitus type 2 in obese ([place])   . Tobacco abuse   . History of breast cancer   . Cognitive deficits   . Dysphagia, post-stroke   . Stroke (cerebrum) ([place])  03/02/2019  . Middle cerebral artery embolism, right 03/02/2019  . Chest pain 12/19/2015  . Numbness 12/19/2015  . Femoral hernia 03/24/2014  . Dyslipidemia 09/10/2011  . BRCA1 positive 09/10/2011  . Breast cancer ([place]) 09/10/2011  . H/O gastric bypass; 05/14/11([facility]) 09/10/2011  . Essential hypertension 10/28/2008  . DEPRESSION/ANXIETY 02/19/2007  . CARPAL TUNNEL SYNDROME, RIGHT 02/19/2007  . ALLERGIC RHINITIS 02/19/2007  . GERD 02/19/2007  . IRRITABLE BOWEL SYNDROME 02/19/2007  . HERPES GENITALIS 12/30/2006  . Diabetes ([place]) 12/30/2006  . OSTEOPENIA 12/30/2006  . ANOMALY, CONGENITAL, NERVOUS SYSTEM NEC 12/30/2006   PCP:  Hayden Rasmussen, MD Pharmacy:   [id number] [place] [id number]: [phone]: [phone]    Social Determinants of Health (SDOH) Interventions    Readmission Risk Interventions No flowsheet data found.

## 2019-11-23 ENCOUNTER — Encounter (HOSPITAL_COMMUNITY)
Admission: EM | Disposition: A | Discharge status: Home / Self Care | Payer: Self-pay | Source: Home / Self Care | Attending: Internal Medicine

## 2019-11-23 ENCOUNTER — Ambulatory Visit: Payer: Medicare Other | Admitting: Physical Therapy

## 2019-11-23 ENCOUNTER — Ambulatory Visit: Payer: Medicare Other | Admitting: Occupational Therapy

## 2019-11-23 ENCOUNTER — Other Ambulatory Visit: Payer: Self-pay

## 2019-11-23 ENCOUNTER — Inpatient Hospital Stay (HOSPITAL_COMMUNITY): Payer: Medicare Other | Admitting: Anesthesiology

## 2019-11-23 ENCOUNTER — Encounter (HOSPITAL_COMMUNITY): Payer: Self-pay | Admitting: Internal Medicine

## 2019-11-23 DIAGNOSIS — R599 Enlarged lymph nodes, unspecified: Secondary | ICD-10-CM | POA: Diagnosis not present

## 2019-11-23 DIAGNOSIS — L03314 Cellulitis of groin: Secondary | ICD-10-CM | POA: Diagnosis not present

## 2019-11-23 HISTORY — PX: INGUINAL LYMPH NODE BIOPSY: SHX5865

## 2019-11-23 LAB — MAGNESIUM: Magnesium: 2.2 mg/dL (ref 1.7–2.4)

## 2019-11-23 LAB — CBC WITH DIFFERENTIAL/PLATELET
Abs Immature Granulocytes: 0.22 10*3/uL — ABNORMAL HIGH (ref 0.00–0.07)
Basophils Absolute: 0.1 10*3/uL (ref 0.0–0.1)
Basophils Relative: 1 %
Eosinophils Absolute: 0.2 10*3/uL (ref 0.0–0.5)
Eosinophils Relative: 1 %
HCT: 36.3 % (ref 36.0–46.0)
Hemoglobin: 11.8 g/dL — ABNORMAL LOW (ref 12.0–15.0)
Immature Granulocytes: 1 %
Lymphocytes Relative: 9 %
Lymphs Abs: 1.8 10*3/uL (ref 0.7–4.0)
MCH: 28.5 pg (ref 26.0–34.0)
MCHC: 32.5 g/dL (ref 30.0–36.0)
MCV: 87.7 fL (ref 80.0–100.0)
Monocytes Absolute: 0.9 10*3/uL (ref 0.1–1.0)
Monocytes Relative: 5 %
Neutro Abs: 15.7 10*3/uL — ABNORMAL HIGH (ref 1.7–7.7)
Neutrophils Relative %: 83 %
Platelets: 278 10*3/uL (ref 150–400)
RBC: 4.14 MIL/uL (ref 3.87–5.11)
RDW: 14 % (ref 11.5–15.5)
WBC: 18.8 10*3/uL — ABNORMAL HIGH (ref 4.0–10.5)
nRBC: 0 % (ref 0.0–0.2)

## 2019-11-23 LAB — BASIC METABOLIC PANEL
Anion gap: 9 (ref 5–15)
BUN: 10 mg/dL (ref 6–20)
CO2: 22 mmol/L (ref 22–32)
Calcium: 8.6 mg/dL — ABNORMAL LOW (ref 8.9–10.3)
Chloride: 102 mmol/L (ref 98–111)
Creatinine, Ser: 0.78 mg/dL (ref 0.44–1.00)
GFR calc Af Amer: >60 mL/min (ref >60)
GFR calc non Af Amer: >60 mL/min (ref >60)
Glucose, Bld: 132 mg/dL — ABNORMAL HIGH (ref 70–99)
Potassium: 3.5 mmol/L (ref 3.5–5.1)
Sodium: 133 mmol/L — ABNORMAL LOW (ref 135–145)

## 2019-11-23 LAB — GLUCOSE, CAPILLARY
Glucose-Capillary: 65 mg/dL — ABNORMAL LOW (ref 70–99)
Glucose-Capillary: 103 mg/dL — ABNORMAL HIGH (ref 70–99)
Glucose-Capillary: 62 mg/dL — ABNORMAL LOW (ref 70–99)
Glucose-Capillary: 72 mg/dL (ref 70–99)
Glucose-Capillary: 89 mg/dL (ref 70–99)
Glucose-Capillary: 131 mg/dL — ABNORMAL HIGH (ref 70–99)
Glucose-Capillary: 186 mg/dL — ABNORMAL HIGH (ref 70–99)

## 2019-11-23 LAB — SURGICAL PCR SCREEN
MRSA, PCR: NEGATIVE
Staphylococcus aureus: NEGATIVE

## 2019-11-23 LAB — C-REACTIVE PROTEIN: CRP: 27.4 mg/dL — ABNORMAL HIGH (ref <1.0)

## 2019-11-23 LAB — SEDIMENTATION RATE: Sed Rate: 105 mm/hr — ABNORMAL HIGH (ref 0–22)

## 2019-11-23 LAB — PROCALCITONIN: Procalcitonin: 1.23 ng/mL

## 2019-11-23 SURGERY — BIOPSY, LYMPH NODE, INGUINAL, OPEN
Anesthesia: General | Site: Inguinal | Laterality: Right

## 2019-11-23 MED ORDER — HYDROMORPHONE HCL 1 MG/ML IJ SOLN: 0.2500 mg | INTRAMUSCULAR | Status: DC | PRN

## 2019-11-23 MED ORDER — LIDOCAINE HCL 1 % IJ SOLN
INTRAMUSCULAR | Status: DC | PRN
  Administered 2019-11-23: 8 mL

## 2019-11-23 MED ORDER — PROPOFOL 10 MG/ML IV BOLUS
INTRAVENOUS | Status: AC
  Filled 2019-11-23: qty 40

## 2019-11-23 MED ORDER — DEXTROSE 50 % IV SOLN
12.5000 g | INTRAVENOUS | Status: AC
  Administered 2019-11-23: 12.5 g via INTRAVENOUS
  Filled 2019-11-23: qty 50

## 2019-11-23 MED ORDER — PROMETHAZINE HCL 25 MG/ML IJ SOLN: 6.2500 mg | INTRAMUSCULAR | Status: DC | PRN

## 2019-11-23 MED ORDER — LIDOCAINE HCL (PF) 1 % IJ SOLN
INTRAMUSCULAR | Status: AC
  Filled 2019-11-23: qty 30

## 2019-11-23 MED ORDER — BUPIVACAINE HCL (PF) 0.5 % IJ SOLN
INTRAMUSCULAR | Status: AC
  Filled 2019-11-23: qty 30

## 2019-11-23 MED ORDER — ONDANSETRON HCL 4 MG/2ML IJ SOLN
INTRAMUSCULAR | Status: AC
  Filled 2019-11-23: qty 2

## 2019-11-23 MED ORDER — MEPERIDINE HCL 50 MG/ML IJ SOLN: 6.2500 mg | INTRAMUSCULAR | Status: DC | PRN

## 2019-11-23 MED ORDER — FENTANYL CITRATE (PF) 100 MCG/2ML IJ SOLN
INTRAMUSCULAR | Status: AC
  Filled 2019-11-23: qty 2

## 2019-11-23 MED ORDER — 0.9 % SODIUM CHLORIDE (POUR BTL) OPTIME
TOPICAL | Status: DC | PRN
  Administered 2019-11-23: 1000 mL

## 2019-11-23 MED ORDER — KETAMINE HCL 50 MG/5ML IJ SOSY
PREFILLED_SYRINGE | INTRAMUSCULAR | Status: AC
  Filled 2019-11-23: qty 5

## 2019-11-23 MED ORDER — KETOROLAC TROMETHAMINE 30 MG/ML IJ SOLN
30.0000 mg | Freq: Once | INTRAMUSCULAR | Status: AC
  Administered 2019-11-23: 30 mg via INTRAVENOUS

## 2019-11-23 MED ORDER — LACTATED RINGERS IV SOLN
Freq: Once | INTRAVENOUS | Status: AC
  Administered 2019-11-23: 1000 mL via INTRAVENOUS

## 2019-11-23 MED ORDER — ORAL CARE MOUTH RINSE: 15.0000 mL | Freq: Once | OROMUCOSAL | Status: AC

## 2019-11-23 MED ORDER — KETOROLAC TROMETHAMINE 30 MG/ML IJ SOLN
INTRAMUSCULAR | Status: AC
  Filled 2019-11-23: qty 1

## 2019-11-23 MED ORDER — CHLORHEXIDINE GLUCONATE 0.12 % MT SOLN
15.0000 mL | Freq: Once | OROMUCOSAL | Status: AC
  Administered 2019-11-23: 15 mL via OROMUCOSAL

## 2019-11-23 MED ORDER — KETAMINE HCL 10 MG/ML IJ SOLN
INTRAMUSCULAR | Status: AC
  Filled 2019-11-23: qty 1

## 2019-11-23 MED ORDER — LACTATED RINGERS IV SOLN
INTRAVENOUS | Status: DC | PRN
Start: 2019-11-23 — End: 2019-11-23

## 2019-11-23 MED ORDER — KETAMINE HCL 10 MG/ML IJ SOLN
INTRAMUSCULAR | Status: DC | PRN
  Administered 2019-11-23: 20 mg via INTRAVENOUS

## 2019-11-23 MED ORDER — INSULIN GLARGINE 100 UNIT/ML ~~LOC~~ SOLN
5.0000 [IU] | Freq: Every day | SUBCUTANEOUS | Status: DC
  Administered 2019-11-24: 5 [IU] via SUBCUTANEOUS
  Filled 2019-11-23 (×2): qty 0.05

## 2019-11-23 MED ORDER — LIDOCAINE HCL (CARDIAC) PF 100 MG/5ML IV SOSY
PREFILLED_SYRINGE | INTRAVENOUS | Status: DC | PRN
  Administered 2019-11-23: 50 mg via INTRATRACHEAL

## 2019-11-23 MED ORDER — PROPOFOL 500 MG/50ML IV EMUL
INTRAVENOUS | Status: DC | PRN
  Administered 2019-11-23: 150 ug/kg/min via INTRAVENOUS

## 2019-11-23 MED ORDER — MIDAZOLAM HCL 2 MG/2ML IJ SOLN
INTRAMUSCULAR | Status: AC
  Filled 2019-11-23: qty 2

## 2019-11-23 SURGICAL SUPPLY — 35 items
ADH SKN CLS APL DERMABOND .7 (GAUZE/BANDAGES/DRESSINGS) ×1
APPLIER CLIP 9.375 SM OPEN (CLIP)
APR CLP SM 9.3 20 MLT OPN (CLIP)
CLIP APPLIE 9.375 SM OPEN (CLIP) IMPLANT
CLOTH BEACON ORANGE TIMEOUT ST (SAFETY) ×3 IMPLANT
CONT SPEC 4OZ CLIKSEAL STRL BL (MISCELLANEOUS) ×3 IMPLANT
COVER LIGHT HANDLE STERIS (MISCELLANEOUS) ×6 IMPLANT
COVER WAND RF STERILE (DRAPES) ×3 IMPLANT
DECANTER SPIKE VIAL GLASS SM (MISCELLANEOUS) ×5 IMPLANT
DERMABOND ADVANCED (GAUZE/BANDAGES/DRESSINGS) ×2
DERMABOND ADVANCED .7 DNX12 (GAUZE/BANDAGES/DRESSINGS) ×1 IMPLANT
ELECT REM PT RETURN 9FT ADLT (ELECTROSURGICAL) ×3
ELECTRODE REM PT RTRN 9FT ADLT (ELECTROSURGICAL) ×1 IMPLANT
GLOVE BIOGEL PI IND STRL 7.0 (GLOVE) ×2 IMPLANT
GLOVE BIOGEL PI INDICATOR 7.0 (GLOVE) ×4
GLOVE SURG SS PI 7.5 STRL IVOR (GLOVE) ×3 IMPLANT
GOWN STRL REUS W/TWL LRG LVL3 (GOWN DISPOSABLE) ×6 IMPLANT
KIT TURNOVER KIT A (KITS) ×3 IMPLANT
MANIFOLD NEPTUNE II (INSTRUMENTS) ×3 IMPLANT
MARKER SKIN DUAL TIP RULER LAB (MISCELLANEOUS) ×2 IMPLANT
NDL HYPO 25X1 1.5 SAFETY (NEEDLE) ×1 IMPLANT
NEEDLE HYPO 25X1 1.5 SAFETY (NEEDLE) ×3 IMPLANT
NS IRRIG 1000ML POUR BTL (IV SOLUTION) ×3 IMPLANT
PACK MINOR (CUSTOM PROCEDURE TRAY) ×3 IMPLANT
PAD ARMBOARD 7.5X6 YLW CONV (MISCELLANEOUS) ×3 IMPLANT
SET BASIN LINEN APH (SET/KITS/TRAYS/PACK) ×3 IMPLANT
SHEARS HARMONIC 9CM CVD (BLADE) ×2 IMPLANT
SPONGE INTESTINAL PEANUT (DISPOSABLE) IMPLANT
SUT MNCRL AB 4-0 PS2 18 (SUTURE) ×3 IMPLANT
SUT SILK 2 0 (SUTURE)
SUT SILK 2-0 18XBRD TIE 12 (SUTURE) IMPLANT
SUT VIC AB 3-0 SH 27 (SUTURE) ×6
SUT VIC AB 3-0 SH 27X BRD (SUTURE) ×1 IMPLANT
SUT VICRYL AB 3 0 TIES (SUTURE) IMPLANT
SYR CONTROL 10ML LL (SYRINGE) ×3 IMPLANT

## 2019-11-23 NOTE — Op Note (Signed)
Patient:  Ann Lopez  DOB:  Jun 06, 1970  MRN:  [id number]   Preop Diagnosis: Right inguinal lymphadenopathy  Postop Diagnosis: Same  Procedure: Right inguinal lymph node biopsy  Surgeon: Aviva Signs, MD  Anes: MAC  Indications: Patient is a 49 year old white female with right inguinal lymphadenopathy and leukocytosis of unknown etiology.  The risks and benefits of the procedure including bleeding, infection, pain, and the possibility of malignancy were fully explained to the patient, who gave informed consent.  Procedure note: The patient was placed in the supine position.  After monitored anesthesia care was given, the right groin region was prepped and draped using the usual sterile technique with Betadine.  Surgical site confirmation was performed.  1% Xylocaine was used for local anesthesia.  An incision was made in the right groin region down to the subcutaneous tissue.  I was able to find a 2 cm elongated inflamed lymph node.  A harmonic scalpel was used to excise the lymph node.  It was sent to pathology for flow cytometry.  A bleeding was controlled using Bovie electrocautery.  Subcutaneous layer was reapproximated in layers using a 3-0 Vicryl interrupted suture.  The skin was closed using a 4-0 Monocryl subcuticular suture.  Dermabond was applied.  All tape and needle counts were correct at the end of the procedure.  The patient was awakened and transferred to PACU in stable condition.  Complications: None  EBL: Minimal  Specimen: Right inguinal lymph node

## 2019-11-23 NOTE — Progress Notes (Signed)
Repeat blood glucose 103mg /sl after IV dextrose admin'd. Pt without c/o, A&O. OR up to get pt for procedure via wheelchair. Pt ambulated to [facility] without diff or assistance.

## 2019-11-23 NOTE — Plan of Care (Signed)

## 2019-11-23 NOTE — Progress Notes (Signed)
Notified on call physician that pt admitted with cellulitis, enlarged lymph node. IV vanc discontinued 11/22/19 1856. Pt currently on no antibiotic. Awaiting response

## 2019-11-23 NOTE — Progress Notes (Signed)
Pt back to room from PACU. Skin glue dry and intact to right groin incision. Ice pack intact to groin. Pt states that her abdominal pain/pressure is gone since lymph node removed. Pt states is hungry, has asked family to bring her food. Denies any needs at this time. VSS.

## 2019-11-23 NOTE — Interval H&P Note (Signed)
History and Physical Interval Note:  11/23/2019 8:38 AM  Ann Lopez  has presented today for surgery, with the diagnosis of lymphadenopathy.  The various methods of treatment have been discussed with the patient and family. After consideration of risks, benefits and other options for treatment, the patient has consented to  Procedure(s): INGUINAL LYMPH NODE BIOPSY (Right) as a surgical intervention.  The patient's history has been reviewed, patient examined, no change in status, stable for surgery.  I have reviewed the patient's chart and labs.  Questions were answered to the patient's satisfaction.     Aviva Signs

## 2019-11-23 NOTE — Anesthesia Postprocedure Evaluation (Signed)
Anesthesia Post Note  Patient: Ann Lopez  Procedure(s) Performed: INGUINAL LYMPH NODE BIOPSY (Right Inguinal)  Patient location during evaluation: PACU Anesthesia Type: MAC Level of consciousness: awake and alert Pain management: pain level controlled Vital Signs Assessment: post-procedure vital signs reviewed and stable Respiratory status: spontaneous breathing Cardiovascular status: stable Postop Assessment: no apparent nausea or vomiting Anesthetic complications: no   No complications documented.   Last Vitals:  Vitals:   11/23/19 1115 11/23/19 1145  BP: 99/64 107/70  Pulse: 75 73  Resp: 18 16  Temp:  37.3 C  SpO2: 99% 100%    Last Pain:  Vitals:   11/23/19 1145  TempSrc: Oral  PainSc:                  [name]

## 2019-11-23 NOTE — Anesthesia Preprocedure Evaluation (Signed)
Anesthesia Evaluation  Patient identified by MRN, date of birth, ID band Patient awake    Reviewed: Allergy & Precautions, NPO status , Patient's Chart, lab work & pertinent test results  History of Anesthesia Complications (+) history of anesthetic complications  Airway Mallampati: II  TM Distance: >3 FB Neck ROM: Full    Dental  (+) Missing, Dental Advisory Given   Pulmonary shortness of breath and with exertion, asthma , former smoker,    Pulmonary exam normal breath sounds clear to auscultation       Cardiovascular Exercise Tolerance: Poor hypertension, Pt. on medications + Peripheral Vascular Disease (carotid stent - 02/2019)  Normal cardiovascular exam Rhythm:Regular Rate:Normal     Neuro/Psych  Headaches, PSYCHIATRIC DISORDERS Anxiety Depression  Neuromuscular disease CVA (left sided weakness), Residual Symptoms    GI/Hepatic GERD (no GERD today, h/o gastric bypass)  Medicated,Gastric bypass   Endo/Other  diabetes, Well Controlled, Type 2, Insulin Dependent  Renal/GU      Musculoskeletal  (+) Arthritis  (osteopenia),   Abdominal   Peds  Hematology negative hematology ROS (+)   Anesthesia Other Findings   Reproductive/Obstetrics                             Anesthesia Physical Anesthesia Plan  ASA: IV  Anesthesia Plan: General   Post-op Pain Management:    Induction: Intravenous  PONV Risk Score and Plan:   Airway Management Planned: Nasal Cannula, Natural Airway and Simple Face Mask  Additional Equipment:   Intra-op Plan:   Post-operative Plan:   Informed Consent: I have reviewed the patients History and Physical, chart, labs and discussed the procedure including the risks, benefits and alternatives for the proposed anesthesia with the patient or authorized representative who has indicated his/her understanding and acceptance.     Dental advisory given  Plan  Discussed with: CRNA and Surgeon  Anesthesia Plan Comments: (Possible GA with airway was discussed)        Anesthesia Quick Evaluation

## 2019-11-23 NOTE — Care Management Important Message (Signed)
Important Message  Patient Details  Name: Ann Lopez MRN: [id number] Date of Birth: [birth year]   Medicare Important Message Given:  Yes     Tommy Medal 11/23/2019, 3:54 PM

## 2019-11-23 NOTE — Progress Notes (Signed)
PROGRESS NOTE    Ann Lopez  XBM:[id number] DOB: 09/19/1970 DOA: 11/20/2019 PCP: Hayden Rasmussen, MD   Brief Narrative:  Per HPI: Ann Lopez a 49 y.o.femalewith medical history significant forprior right MCA CVA, right ICA stent, hypertension, dyslipidemia, diabetes, obesity, and anxiety who presented to the ED with worsening right-sided groin pain over the last several days. She states that this began Friday of last week and initially started with lack of appetite as well as some nausea and vomiting. She states that the pain in her groin became worse and she went to see her PCP who noted possible infected lymph node on the right side and prescribed her oral doxycycline. She apparently had just finished a course of doxycycline for an infection on her finger as well. She states that her pain began to worsen and she developed a fever which prompted the ED visit. She was also noted to have some chills.  -Patient was admitted with sepsis secondary to nonpurulent cellulitis/lymphadenitis of the right groin. She was given IV clindamycin in the ED and then subsequently switched to IV vancomycin for ongoing treatment.  8/14: Patient continues to have right-sided groin pain, but it appears improved compared to yesterday. Seen by general surgery with plans to continue antibiotics for now and consider imaging as needed by a.m. She was noted to have some hypotension yesterday which has improved.  8/15: Patient continues with right-sided groin pain that may be minimally improved.  She still has persistent leukocytosis.  She has recently had work-up for STDs done by her PCP as well as tickborne illness work-up.  Ultrasound without findings of abscess.  8/16: Patient is status post lymph node biopsy this morning.  She appears to overall be doing quite well with no fever noted.  Pain is well controlled.  Plan is to follow leukocytosis which appears to be downward trending.  Continue off  antibiotics at this point and follow lab work that has been ordered.  Likely will result during outpatient follow-up.  Assessment & Plan:   Active Problems:   Sepsis ([place])   Cellulitis   Cellulitis of groin, right   Lymph node enlargement   Sepsis secondary to nonpurulent cellulitis/lymphadenitis of right groin -Noted to have leukocytosis as well as temperature elevation -Unknown etiology of lymphadenitis -Discontinue further antibiotic coverage -Continue to monitor repeat CBC and fever curve -Tylenol as needed for fever -Recent STD work-up performed and negative -Recent tickborne illness performed and negative -Right inguinal lymph node excisional biopsy on 8/16 per general surgery appreciated  Mild hypokalemia-repleted -Reevaluate in a.m.  Type 2 diabetes with hyperglycemia-improved -Continue home long-acting insulin at half dose until diet can be tolerated -Hold home Invokana -SSI -Decrease long-acting insulin dose as patient was actually hypoglycemic overnight -Hemoglobin A1c 8.4%  Prior right MCA CVA/dyslipidemia -With noted prior right ICA stent -Continue on statin -Continue aspirin and Brilinta  History of hypertension -Noted to actually have some hypotension which is not unusual for her-stable -Continue to monitor  Anxiety -Continue home medications  GERD -PPI  History of BRCA1 mutation and prior breast cancer -Status post prior hysterectomy and bilateral salpingo-oophorectomy -Status post prior bilateral mastectomy  Obesity -Lifestyle changes   DVT prophylaxis:Lovenox Code Status:Full code Family Communication:None at bedside Disposition Plan:  Status is: Inpatient  Remains inpatient appropriate because:IV treatments appropriate due to intensity of illness or inability to take PO and Inpatient level of care appropriate due to severity of illness   Dispo: The patient is from:Home Anticipated d/c  is  to:Home Anticipated d/c date is: 1 day Patient currently is not medically stable to d/c.She requires continued monitoring for leukocytosis and will likely dc in am if stable.  Consultants:  General surgery  Procedures:  Status post right inguinal groin excisional lymph node biopsy on 8/16  Antimicrobials:  Anti-infectives (From admission, onward)   Start     Dose/Rate Route Frequency Ordered Stop   11/22/19 1900  vancomycin (VANCOCIN) IVPB 1000 mg/200 mL premix  Status:  Discontinued        1,000 mg 200 mL/hr over 60 Minutes Intravenous  Once 11/22/19 1847 11/22/19 1856   11/21/19 0100  vancomycin (VANCOCIN) IVPB 1000 mg/200 mL premix  Status:  Discontinued        1,000 mg 200 mL/hr over 60 Minutes Intravenous Every 12 hours 11/20/19 1416 11/22/19 1845   11/20/19 1600  vancomycin (VANCOCIN) IVPB 1000 mg/200 mL premix        1,000 mg 200 mL/hr over 60 Minutes Intravenous  Once 11/20/19 1415 11/20/19 1702   11/20/19 1330  vancomycin (VANCOCIN) IVPB 1000 mg/200 mL premix        1,000 mg 200 mL/hr over 60 Minutes Intravenous  Once 11/20/19 1323 11/20/19 1450   11/20/19 0745  clindamycin (CLEOCIN) IVPB 600 mg        600 mg 100 mL/hr over 30 Minutes Intravenous  Once 11/20/19 0731 11/20/19 0813       Subjective: Patient seen and evaluated status post lymph node biopsy this a.m.  She appears quite comfortable and denies any pain.  She is asking to eat.  Objective: Vitals:   11/23/19 1100 11/23/19 1115 11/23/19 1130 11/23/19 1145  BP: 103/65 99/64 105/69 107/70  Pulse: 75 75 73 73  Resp: 20 18 19 16   Temp:    99.2 F (37.3 C)  TempSrc:    Oral  SpO2: 100% 99% 98% 100%  Weight:      Height:        Intake/Output Summary (Last 24 hours) at 11/23/2019 1511 Last data filed at 11/23/2019 1050 Gross per 24 hour  Intake 400 ml  Output 2 ml  Net 398 ml   Filed Weights   11/20/19 0445 11/20/19 2118  Weight: 79 kg 82.5 kg     Examination:  General exam: Appears calm and comfortable, obese Respiratory system: Clear to auscultation. Respiratory effort normal. Cardiovascular system: S1 & S2 heard, RRR. Gastrointestinal system: Abdomen is nondistended, soft and nontender.  Central nervous system: Alert and oriented. No focal neurological deficits. Extremities: Symmetric 5 x 5 power. Skin: No rashes, lesions or ulcers, incision to right groin clean dry and intact Psychiatry: Judgement and insight appear normal. Mood & affect appropriate.     Data Reviewed: I have personally reviewed following labs and imaging studies  CBC: Recent Labs  Lab 11/20/19 0509 11/21/19 0601 11/22/19 0804 11/23/19 0811  WBC 14.5* 28.4* 27.9* 18.8*  NEUTROABS 12.0*  --   --  15.7*  HGB 15.3* 12.4 11.2* 11.8*  HCT 45.6 39.3 34.9* 36.3  MCV 87.7 88.7 88.1 87.7  PLT 245 234 252 027   Basic Metabolic Panel: Recent Labs  Lab 11/20/19 0509 11/21/19 0601 11/22/19 0804 11/23/19 0811  NA 138 137 133* 133*  K 3.6 3.6 3.3* 3.5  CL 102 105 102 102  CO2 23 22 22 22   GLUCOSE 191* 107* 114* 132*  BUN 10 12 13 10   CREATININE 0.66 0.67 0.79 0.78  CALCIUM 9.5 8.6* 8.6* 8.6*  MG  --  1.5* 2.1 2.2   GFR: Estimated Creatinine Clearance: 86.6 mL/min (by C-G formula based on SCr of 0.78 mg/dL). Liver Function Tests: Recent Labs  Lab 11/20/19 0509 11/21/19 0601  AST 19 14*  ALT 26 18  ALKPHOS 79 69  BILITOT 0.7 1.3*  PROT 9.2* 7.3  ALBUMIN 4.2 3.1*   No results for input(s): LIPASE, AMYLASE in the last 168 hours. No results for input(s): AMMONIA in the last 168 hours. Coagulation Profile: No results for input(s): INR, PROTIME in the last 168 hours. Cardiac Enzymes: No results for input(s): CKTOTAL, CKMB, CKMBINDEX, TROPONINI in the last 168 hours. BNP (last 3 results) No results for input(s): PROBNP in the last 8760 hours. HbA1C: No results for input(s): HGBA1C in the last 72 hours. CBG: Recent Labs  Lab  11/23/19 0729 11/23/19 0821 11/23/19 1105 11/23/19 1122 11/23/19 1143  GLUCAP 65* 103* 62* 72 89   Lipid Profile: No results for input(s): CHOL, HDL, LDLCALC, TRIG, CHOLHDL, LDLDIRECT in the last 72 hours. Thyroid Function Tests: No results for input(s): TSH, T4TOTAL, FREET4, T3FREE, THYROIDAB in the last 72 hours. Anemia Panel: No results for input(s): VITAMINB12, FOLATE, FERRITIN, TIBC, IRON, RETICCTPCT in the last 72 hours. Sepsis Labs: Recent Labs  Lab 11/21/19 0950 11/22/19 0804 11/23/19 0811  PROCALCITON  --  2.37 1.23  LATICACIDVEN 1.5 1.3  --     Recent Results (from the past 240 hour(s))  SARS Coronavirus 2 by RT PCR (hospital order, performed in [facility] hospital lab) Nasopharyngeal Nasopharyngeal Swab     Status: None   Collection Time: 11/20/19  6:32 AM   Specimen: Nasopharyngeal Swab  Result Value Ref Range Status   SARS Coronavirus 2 NEGATIVE NEGATIVE Final    Comment: (NOTE) SARS-CoV-2 target nucleic acids are NOT DETECTED.  The SARS-CoV-2 RNA is generally detectable in upper and lower respiratory specimens during the acute phase of infection. The lowest concentration of SARS-CoV-2 viral copies this assay can detect is 250 copies / mL. A negative result does not preclude SARS-CoV-2 infection and should not be used as the sole basis for treatment or other patient management decisions.  A negative result may occur with improper specimen collection / handling, submission of specimen other than nasopharyngeal swab, presence of viral mutation(s) within the areas targeted by this assay, and inadequate number of viral copies (<250 copies / mL). A negative result must be combined with clinical observations, patient history, and epidemiological information.  Fact Sheet for Patients:   StrictlyIdeas.no  Fact Sheet for Healthcare Providers: BankingDealers.co.za  This test is not yet approved or  cleared by the  [place] FDA and has been authorized for detection and/or diagnosis of SARS-CoV-2 by FDA under an Emergency Use Authorization (EUA).  This EUA will remain in effect (meaning this test can be used) for the duration of the COVID-19 declaration under Section 564(b)(1) of the Act, 21 U.S.C. section 360bbb-3(b)(1), unless the authorization is terminated or revoked sooner.  Performed at [facility], [address] [place], [place] [address]   MRSA PCR Screening     Status: None   Collection Time: 11/20/19  1:04 PM   Specimen: Nasopharyngeal  Result Value Ref Range Status   MRSA by PCR NEGATIVE NEGATIVE Final    Comment:        The GeneXpert MRSA Assay (FDA approved for NASAL specimens only), is one component of a comprehensive MRSA colonization surveillance program. It is not intended to diagnose MRSA infection nor to guide or monitor treatment  for MRSA infections. Performed at [facility], [address] [place], [place] [address]   Culture, blood (routine x 2)     Status: None (Preliminary result)   Collection Time: 11/20/19  2:10 PM   Specimen: Right Antecubital; Blood  Result Value Ref Range Status   Specimen Description   Final    RIGHT ANTECUBITAL BOTTLES DRAWN AEROBIC AND ANAEROBIC   Special Requests   Final    Blood Culture results may not be optimal due to an inadequate volume of blood received in culture bottles   Culture   Final    NO GROWTH 3 DAYS Performed at [facility], [address], [place], [place] [address]    Report Status PENDING  Incomplete  Culture, blood (routine x 2)     Status: None (Preliminary result)   Collection Time: 11/20/19  2:17 PM   Specimen: BLOOD RIGHT HAND  Result Value Ref Range Status   Specimen Description   Final    BLOOD RIGHT HAND BOTTLES DRAWN AEROBIC AND ANAEROBIC   Special Requests   Final    Blood Culture results may not be optimal due to an inadequate volume of blood received in culture bottles   Culture   Final    NO  GROWTH 3 DAYS Performed at [facility], [address] [place], [place] [address]    Report Status PENDING  Incomplete  Surgical pcr screen     Status: None   Collection Time: 11/22/19 10:18 PM   Specimen: Nasal Mucosa; Nasal Swab  Result Value Ref Range Status   MRSA, PCR NEGATIVE NEGATIVE Final   Staphylococcus aureus NEGATIVE NEGATIVE Final    Comment: (NOTE) The Xpert SA Assay (FDA approved for NASAL specimens in patients 33 years of age and older), is one component of a comprehensive surveillance program. It is not intended to diagnose infection nor to guide or monitor treatment. Performed at [facility], [address], [place],  [address]          Radiology Studies: [place] RT LOWER EXTREM LTD SOFT TISSUE NON VASCULAR  Result Date: 11/22/2019 CLINICAL DATA:  Cellulitis of the right groin. Evaluate for abscess. EXAM: ULTRASOUND RIGHT LOWER EXTREMITY LIMITED TECHNIQUE: Ultrasound examination of the lower extremity soft tissues was performed in the area of clinical concern. COMPARISON:  None. FINDINGS: No fluid collection or hematoma.  No solid or cystic mass. Prominent right inguinal lymph node measuring 1.5 cm in short axis with normal morphology. No significant increased Doppler flow. No other enlarged lymph nodes. IMPRESSION: 1. No right inguinal fluid collection to suggest an abscess. 2. Prominent right inguinal lymph node measuring 15 mm in short axis with normal morphology likely reflecting a reactive lymph node. Electronically Signed   By: Kathreen Devoid   On: 11/22/2019 13:16        Scheduled Meds: . aspirin EC  81 mg Oral Daily  . cariprazine  1.5 mg Oral Daily  . DULoxetine  60 mg Oral Daily  . enoxaparin (LOVENOX) injection  40 mg Subcutaneous Q24H  . gabapentin  300 mg Oral QHS  . icosapent Ethyl  2 g Oral BID  . insulin aspart  0-15 Units Subcutaneous TID WC  . insulin aspart  0-5 Units Subcutaneous QHS  . [START ON 11/24/2019] insulin glargine  5 Units  Subcutaneous Daily  . loratadine  10 mg Oral Daily  . montelukast  10 mg Oral Daily  . pantoprazole  40 mg Oral Daily  . potassium chloride  10 mEq Oral Daily  . rosuvastatin  40 mg Oral Daily  . ticagrelor  90 mg Oral Daily    LOS: 3 days    Time spent: 30 minutes    Amaliya Whitelaw Darleen Crocker, DO [facility]  If 7PM-7AM, please contact night-coverage [email or web address] 11/23/2019, 3:11 PM

## 2019-11-23 NOTE — Transfer of Care (Signed)
Immediate Anesthesia Transfer of Care Note  Patient: Ann Lopez  Procedure(s) Performed: INGUINAL LYMPH NODE BIOPSY (Right )  Patient Location: PACU  Anesthesia Type:MAC  Level of Consciousness: awake  Airway & Oxygen Therapy: Patient Spontanous Breathing  Post-op Assessment: Report given to RN and Post -op Vital signs reviewed and stable  Post vital signs: Reviewed and stable  Last Vitals:  Vitals Value Taken Time  BP    Temp    Pulse    Resp    SpO2      Last Pain:  Vitals:   11/23/19 0845  TempSrc: Oral  PainSc: 7       Patients Stated Pain Goal: 8 (91/50/56 9794)  Complications: No complications documented.

## 2019-11-24 ENCOUNTER — Encounter (HOSPITAL_COMMUNITY): Payer: Self-pay | Admitting: General Surgery

## 2019-11-24 DIAGNOSIS — L03314 Cellulitis of groin: Secondary | ICD-10-CM | POA: Diagnosis not present

## 2019-11-24 DIAGNOSIS — R599 Enlarged lymph nodes, unspecified: Secondary | ICD-10-CM | POA: Diagnosis not present

## 2019-11-24 DIAGNOSIS — L03115 Cellulitis of right lower limb: Secondary | ICD-10-CM | POA: Diagnosis not present

## 2019-11-24 LAB — BASIC METABOLIC PANEL
Anion gap: 9 (ref 5–15)
BUN: 10 mg/dL (ref 6–20)
CO2: 24 mmol/L (ref 22–32)
Calcium: 8.5 mg/dL — ABNORMAL LOW (ref 8.9–10.3)
Chloride: 104 mmol/L (ref 98–111)
Creatinine, Ser: 0.81 mg/dL (ref 0.44–1.00)
GFR calc Af Amer: >60 mL/min (ref >60)
GFR calc non Af Amer: >60 mL/min (ref >60)
Glucose, Bld: 133 mg/dL — ABNORMAL HIGH (ref 70–99)
Potassium: 3.9 mmol/L (ref 3.5–5.1)
Sodium: 137 mmol/L (ref 135–145)

## 2019-11-24 LAB — PROCALCITONIN: Procalcitonin: 0.65 ng/mL

## 2019-11-24 LAB — CBC WITH DIFFERENTIAL/PLATELET
Abs Immature Granulocytes: 0.21 10*3/uL — ABNORMAL HIGH (ref 0.00–0.07)
Basophils Absolute: 0.1 10*3/uL (ref 0.0–0.1)
Basophils Relative: 1 %
Eosinophils Absolute: 0.2 10*3/uL (ref 0.0–0.5)
Eosinophils Relative: 1 %
HCT: 36 % (ref 36.0–46.0)
Hemoglobin: 11.4 g/dL — ABNORMAL LOW (ref 12.0–15.0)
Immature Granulocytes: 2 %
Lymphocytes Relative: 12 %
Lymphs Abs: 1.7 10*3/uL (ref 0.7–4.0)
MCH: 27.7 pg (ref 26.0–34.0)
MCHC: 31.7 g/dL (ref 30.0–36.0)
MCV: 87.6 fL (ref 80.0–100.0)
Monocytes Absolute: 1 10*3/uL (ref 0.1–1.0)
Monocytes Relative: 7 %
Neutro Abs: 10.7 10*3/uL — ABNORMAL HIGH (ref 1.7–7.7)
Neutrophils Relative %: 77 %
Platelets: 307 10*3/uL (ref 150–400)
RBC: 4.11 MIL/uL (ref 3.87–5.11)
RDW: 13.8 % (ref 11.5–15.5)
WBC: 13.9 10*3/uL — ABNORMAL HIGH (ref 4.0–10.5)
nRBC: 0 % (ref 0.0–0.2)

## 2019-11-24 LAB — BARTONELLA ANTIBODY PANEL
B Quintana IgM: NEGATIVE titer
B henselae IgG: NEGATIVE titer
B henselae IgM: NEGATIVE titer
B quintana IgG: NEGATIVE titer

## 2019-11-24 LAB — SEDIMENTATION RATE: Sed Rate: 120 mm/hr — ABNORMAL HIGH (ref 0–22)

## 2019-11-24 LAB — C-REACTIVE PROTEIN: CRP: 14.6 mg/dL — ABNORMAL HIGH (ref <1.0)

## 2019-11-24 LAB — ANA W/REFLEX IF POSITIVE: Anti Nuclear Antibody (ANA): NEGATIVE

## 2019-11-24 LAB — GLUCOSE, CAPILLARY
Glucose-Capillary: 109 mg/dL — ABNORMAL HIGH (ref 70–99)
Glucose-Capillary: 104 mg/dL — ABNORMAL HIGH (ref 70–99)

## 2019-11-24 LAB — MAGNESIUM: Magnesium: 2.2 mg/dL (ref 1.7–2.4)

## 2019-11-24 NOTE — Discharge Summary (Signed)
Physician Discharge Summary  Ann Lopez ZTI:[id number] DOB: 1970/05/24 DOA: 11/20/2019  PCP: Hayden Rasmussen, MD  Admit date: 11/20/2019  Discharge date: 11/24/2019  Admitted From:Home  Disposition:  Home  Recommendations for Outpatient Follow-up:  1. Follow up with PCP in 1-2 weeks 2. Lymph node biopsy pathology will need to be followed up as well as labs to include ANA, QuantiFERON, Bartonella serology, Fungitell assay, and fungal blood cultures 3. Patient will receive call from general surgery for follow-up  Home Health: None  Equipment/Devices: None  Discharge Condition: Stable  CODE STATUS: Full  Diet recommendation: Heart Healthy/carb modified  Brief/Interim Summary: Per HPI: Ann Lopez a 49 y.o.femalewith medical history significant forprior right MCA CVA, right ICA stent, hypertension, dyslipidemia, diabetes, obesity, and anxiety who presented to the ED with worsening right-sided groin pain over the last several days. She states that this began Friday of last week and initially started with lack of appetite as well as some nausea and vomiting. She states that the pain in her groin became worse and she went to see her PCP who noted possible infected lymph node on the right side and prescribed her oral doxycycline. She apparently had just finished a course of doxycycline for an infection on her finger as well. She states that her pain began to worsen and she developed a fever which prompted the ED visit. She was also noted to have some chills.  -Patient was admitted with sepsis secondary to nonpurulent cellulitis/lymphadenitis of the right groin. She was given IV clindamycin in the ED and then subsequently switched to IV vancomycin for ongoing treatment.  She has apparently had work-up for tickborne illness as well as STDs in outpatient setting with negative work-up.  8/14: Patient continues to have right-sided groin pain, but it appears improved compared  to yesterday. Seen by general surgery with plans to continue antibiotics for now and consider imaging as needed by a.m. She was noted to have some hypotension yesterday which has improved.  8/15:Patient continues with right-sided groin pain that may be minimally improved. She still has persistent leukocytosis. She has recently had work-up for STDs done by her PCP as well as tickborne illness work-up. Ultrasound without findings of abscess.  8/16: Patient is status post lymph node biopsy this morning.  She appears to overall be doing quite well with no fever noted.  Pain is well controlled.  Plan is to follow leukocytosis which appears to be downward trending.  Continue off antibiotics at this point and follow lab work that has been ordered.  Likely will result during outpatient follow-up.  8/17: Patient is overall doing quite well with no further fevers and leukocytosis that has been improving, down to 13,000 today.  She has remained off antibiotics and has otherwise been doing well after her lymph node biopsy.  She is eager for discharge and states that she will follow-up with her PCP to review lymph node biopsy results as well as the remainder of the results as noted above.  Discharge Diagnoses:  Active Problems:   Sepsis ([place])   Cellulitis   Cellulitis of groin, right   Lymph node enlargement  Principal discharge diagnosis: Sepsis secondary to nonpurulent cellulitis/lymphadenitis of right groin status post right inguinal lymph node biopsy.  Discharge Instructions  Discharge Instructions    Diet - low sodium heart healthy   Complete by: As directed    If the dressing is still on your incision site when you go home, remove it on the third  day after your surgery date. Remove dressing if it begins to fall off, or if it is dirty or damaged before the third day.   Complete by: As directed    Increase activity slowly   Complete by: As directed      Allergies as of 11/24/2019       Reactions   Azithromycin Other (See Comments)   Unknown reaction   Morphine And Related Other (See Comments)   Unknown reaction - Can tolerate hydrocodone and dilaudid without side effects   Nitrofurantoin Other (See Comments)   Unknown reaction   Penicillins Hives, Itching   Has patient had a PCN reaction causing immediate rash, facial/tongue/throat swelling, SOB or lightheadedness with hypotension: Yes Has patient had a PCN reaction causing severe rash involving mucus membranes or skin necrosis: No Has patient had a PCN reaction that required hospitalization No Has patient had a PCN reaction occurring within the last 10 years: No If all of the above answers are "NO", then may proceed with Cephalosporin use.   Sulfonamide Derivatives Hives      Medication List    TAKE these medications   aspirin EC 81 MG tablet Take 1 tablet (81 mg total) by mouth daily.   baclofen 10 MG tablet Commonly known as: LIORESAL Take 10 mg by mouth 3 (three) times daily as needed.   Breztri Aerosphere 160-9-4.8 MCG/ACT Aero Generic drug: Budeson-Glycopyrrol-Formoterol Inhale 2 puffs into the lungs in the morning and at bedtime.   canagliflozin 300 MG Tabs tablet Commonly known as: Invokana Take 1 tablet (300 mg total) by mouth daily before breakfast.   clonazePAM 0.5 MG tablet Commonly known as: KLONOPIN Take 0.5 tablets (0.25 mg total) by mouth 2 (two) times daily as needed for anxiety. What changed: additional instructions   docusate sodium 100 MG capsule Commonly known as: COLACE Take 200 mg by mouth daily as needed.   doxycycline 100 MG capsule Commonly known as: VIBRAMYCIN Take 100 mg by mouth 2 (two) times daily.   DULoxetine 60 MG capsule Commonly known as: CYMBALTA TAKE 1 CAPSULE BY MOUTH EVERY DAY What changed: how much to take   gabapentin 300 MG capsule Commonly known as: NEURONTIN Take 300 mg by mouth at bedtime.   HAIR SKIN & NAILS GUMMIES PO Take 2 Doses by mouth  daily.   HumaLOG KwikPen 100 UNIT/ML KwikPen Generic drug: insulin lispro Inject 8 Units into the skin daily as needed (per sliding scale).   HYDROcodone-acetaminophen 10-325 MG tablet Commonly known as: NORCO Take 1 tablet by mouth every 8 (eight) hours.   icosapent Ethyl 1 g capsule Commonly known as: Vascepa Take 2 capsules (2 g total) by mouth 2 (two) times daily.   insulin glargine 100 UNIT/ML Solostar Pen Commonly known as: LANTUS Inject 15 Units into the skin daily. What changed:   how much to take  when to take this   montelukast 10 MG tablet Commonly known as: SINGULAIR Take 10 mg by mouth daily.   MULTIVITAMIN GUMMIES WOMENS PO Take 2 Doses/Fill by mouth daily.   omeprazole 20 MG capsule Commonly known as: PRILOSEC Take 20 mg by mouth daily.   onetouch ultrasoft lancets Use as instructed; check sugar before meals and at bedtime What changed:   how much to take  how to take this  when to take this  reasons to take this  additional instructions   Pen Needles 32G X 5 MM Misc 1 application by Does not apply route at bedtime.  potassium chloride 10 MEQ tablet Commonly known as: KLOR-CON Take 1 tablet (10 mEq total) by mouth 2 (two) times daily. What changed: when to take this   rosuvastatin 40 MG tablet Commonly known as: CRESTOR Take 1 tablet (40 mg total) by mouth daily.   temazepam 15 MG capsule Commonly known as: RESTORIL Take 1 capsule (15 mg total) by mouth at bedtime as needed for sleep.   ticagrelor 90 MG Tabs tablet Commonly known as: BRILINTA Take 1 tablet (90 mg total) by mouth 2 (two) times daily. What changed: when to take this   Vitamin D (Ergocalciferol) 1.25 MG (50000 UNIT) Caps capsule Commonly known as: DRISDOL Take 50,000 Units by mouth 2 (two) times a week.   Vraylar capsule Generic drug: cariprazine Take 1.5 mg by mouth daily. Pt's PCP has been giving her samples   XYZAL PO Take 1 tablet by mouth daily.             Discharge Care Instructions  (From admission, onward)         Start     Ordered   11/24/19 0000  If the dressing is still on your incision site when you go home, remove it on the third day after your surgery date. Remove dressing if it begins to fall off, or if it is dirty or damaged before the third day.        11/24/19 1006          Follow-up Information    Hayden Rasmussen, MD Follow up in 1 week(s).   Specialty: Family Medicine Contact information: [place] [address] [phone]              Allergies  Allergen Reactions  . Azithromycin Other (See Comments)    Unknown reaction  . Morphine And Related Other (See Comments)    Unknown reaction - Can tolerate hydrocodone and dilaudid without side effects  . Nitrofurantoin Other (See Comments)    Unknown reaction  . Penicillins Hives and Itching    Has patient had a PCN reaction causing immediate rash, facial/tongue/throat swelling, SOB or lightheadedness with hypotension: Yes Has patient had a PCN reaction causing severe rash involving mucus membranes or skin necrosis: No Has patient had a PCN reaction that required hospitalization No Has patient had a PCN reaction occurring within the last 10 years: No If all of the above answers are "NO", then may proceed with Cephalosporin use.   . Sulfonamide Derivatives Hives    Consultations:  General surgery   Procedures/Studies: CT Abdomen Pelvis W Contrast  Result Date: 11/20/2019 CLINICAL DATA:  Right groin pain and swelling. Right lower quadrant abdominal pain. EXAM: CT ABDOMEN AND PELVIS WITH CONTRAST TECHNIQUE: Multidetector CT imaging of the abdomen and pelvis was performed using the standard protocol following bolus administration of intravenous contrast. CONTRAST:  176mL OMNIPAQUE IOHEXOL 300 MG/ML  SOLN COMPARISON:  CT scan 12/19/2015 FINDINGS: Lower chest: The lung bases are clear of acute process. No pleural effusion or pulmonary  lesions. The heart is normal in size. No pericardial effusion. The distal esophagus and aorta are unremarkable. Hepatobiliary: No hepatic lesions or intrahepatic biliary dilatation. Possible small gallstones. No findings for acute cholecystitis. No common bile duct dilatation. Pancreas: No mass, inflammation or ductal dilatation. Spleen: Normal size. No focal lesions. Adrenals/Urinary Tract: The adrenal glands and kidneys are unremarkable. No renal, ureteral or bladder calculi or mass. No significant collecting system abnormalities are identified on the delayed images. Stomach/Bowel: The stomach  demonstrates stable postoperative changes from probable gastric stapling procedure. No complicating features. The duodenum, small bowel and colon are grossly normal. No acute inflammatory changes, mass lesions or obstructive findings. The terminal ileum is normal. The appendix is normal. Vascular/Lymphatic: The aorta is normal in caliber. No dissection. The branch vessels are patent. The major venous structures are patent. No mesenteric or retroperitoneal mass or adenopathy. Small scattered lymph nodes are noted. Reproductive: Surgically absent. Other: There is a slightly larger left inguinal hernia containing small bowel loops but no findings for incarceration or obstruction. Inflammatory process noted in the right inguinal region. Moderate interstitial changes/inflammation/edema without discrete fluid collection/abscess. There are also enlarged right inguinal and femoral lymph nodes. Right inguinal node on image 87/2 measures 14 mm and right external iliac/femoral lymph node on image 80/2 measures 10.5 mm. The inguinal vessels appear normal. No evidence for venous thrombosis. Musculoskeletal: No significant bony findings. IMPRESSION: 1. Nonspecific inflammatory process in the right inguinal region without discrete fluid collection/abscess. There are also enlarged right inguinal and femoral lymph nodes suggesting  lymphadenitis. 2. No findings for venous thrombosis. 3. Slightly larger left inguinal hernia containing small bowel loops but no findings for incarceration or obstruction. 4. Stable postoperative changes from probable gastric stapling procedure. 5. Possible small gallstones. Electronically Signed   By: Marijo Sanes M.D.   On: 11/20/2019 07:22   [place] RT LOWER EXTREM LTD SOFT TISSUE NON VASCULAR  Result Date: 11/22/2019 CLINICAL DATA:  Cellulitis of the right groin. Evaluate for abscess. EXAM: ULTRASOUND RIGHT LOWER EXTREMITY LIMITED TECHNIQUE: Ultrasound examination of the lower extremity soft tissues was performed in the area of clinical concern. COMPARISON:  None. FINDINGS: No fluid collection or hematoma.  No solid or cystic mass. Prominent right inguinal lymph node measuring 1.5 cm in short axis with normal morphology. No significant increased Doppler flow. No other enlarged lymph nodes. IMPRESSION: 1. No right inguinal fluid collection to suggest an abscess. 2. Prominent right inguinal lymph node measuring 15 mm in short axis with normal morphology likely reflecting a reactive lymph node. Electronically Signed   By: Kathreen Devoid   On: 11/22/2019 13:16     Discharge Exam: Vitals:   11/23/19 1947 11/23/19 2116  BP:  94/60  Pulse:  75  Resp:  16  Temp:  98.2 F (36.8 C)  SpO2: 93% 100%   Vitals:   11/23/19 1130 11/23/19 1145 11/23/19 1947 11/23/19 2116  BP: 105/69 107/70  94/60  Pulse: 73 73  75  Resp: 19 16  16   Temp:  99.2 F (37.3 C)  98.2 F (36.8 C)  TempSrc:  Oral    SpO2: 98% 100% 93% 100%  Weight:    94 kg  Height:    5\' 2"  (1.575 m)    General: Pt is alert, awake, not in acute distress Cardiovascular: RRR, S1/S2 +, no rubs, no gallops Respiratory: CTA bilaterally, no wheezing, no rhonchi Abdominal: Soft, NT, ND, bowel sounds + Extremities: no edema, no cyanosis    The results of significant diagnostics from this hospitalization (including imaging, microbiology,  ancillary and laboratory) are listed below for reference.     Microbiology: Recent Results (from the past 240 hour(s))  SARS Coronavirus 2 by RT PCR (hospital order, performed in [facility] hospital lab) Nasopharyngeal Nasopharyngeal Swab     Status: None   Collection Time: 11/20/19  6:32 AM   Specimen: Nasopharyngeal Swab  Result Value Ref Range Status   SARS Coronavirus 2 NEGATIVE NEGATIVE Final  Comment: (NOTE) SARS-CoV-2 target nucleic acids are NOT DETECTED.  The SARS-CoV-2 RNA is generally detectable in upper and lower respiratory specimens during the acute phase of infection. The lowest concentration of SARS-CoV-2 viral copies this assay can detect is 250 copies / mL. A negative result does not preclude SARS-CoV-2 infection and should not be used as the sole basis for treatment or other patient management decisions.  A negative result may occur with improper specimen collection / handling, submission of specimen other than nasopharyngeal swab, presence of viral mutation(s) within the areas targeted by this assay, and inadequate number of viral copies (<250 copies / mL). A negative result must be combined with clinical observations, patient history, and epidemiological information.  Fact Sheet for Patients:   StrictlyIdeas.no  Fact Sheet for Healthcare Providers: BankingDealers.co.za  This test is not yet approved or  cleared by the [place] FDA and has been authorized for detection and/or diagnosis of SARS-CoV-2 by FDA under an Emergency Use Authorization (EUA).  This EUA will remain in effect (meaning this test can be used) for the duration of the COVID-19 declaration under Section 564(b)(1) of the Act, 21 U.S.C. section 360bbb-3(b)(1), unless the authorization is terminated or revoked sooner.  Performed at [facility], [address] [place], [place] [address]   MRSA PCR Screening     Status: None   Collection  Time: 11/20/19  1:04 PM   Specimen: Nasopharyngeal  Result Value Ref Range Status   MRSA by PCR NEGATIVE NEGATIVE Final    Comment:        The GeneXpert MRSA Assay (FDA approved for NASAL specimens only), is one component of a comprehensive MRSA colonization surveillance program. It is not intended to diagnose MRSA infection nor to guide or monitor treatment for MRSA infections. Performed at [facility], [address], [place], [place] [address]   Culture, blood (routine x 2)     Status: None (Preliminary result)   Collection Time: 11/20/19  2:10 PM   Specimen: Right Antecubital; Blood  Result Value Ref Range Status   Specimen Description   Final    RIGHT ANTECUBITAL BOTTLES DRAWN AEROBIC AND ANAEROBIC   Special Requests   Final    Blood Culture results may not be optimal due to an inadequate volume of blood received in culture bottles   Culture   Final    NO GROWTH 3 DAYS Performed at [facility], [address], [place], [place] [address]    Report Status PENDING  Incomplete  Culture, blood (routine x 2)     Status: None (Preliminary result)   Collection Time: 11/20/19  2:17 PM   Specimen: BLOOD RIGHT HAND  Result Value Ref Range Status   Specimen Description   Final    BLOOD RIGHT HAND BOTTLES DRAWN AEROBIC AND ANAEROBIC   Special Requests   Final    Blood Culture results may not be optimal due to an inadequate volume of blood received in culture bottles   Culture   Final    NO GROWTH 3 DAYS Performed at [facility], [address] [place], [place] [address]    Report Status PENDING  Incomplete  Surgical pcr screen     Status: None   Collection Time: 11/22/19 10:18 PM   Specimen: Nasal Mucosa; Nasal Swab  Result Value Ref Range Status   MRSA, PCR NEGATIVE NEGATIVE Final   Staphylococcus aureus NEGATIVE NEGATIVE Final    Comment: (NOTE) The Xpert SA Assay (FDA approved for NASAL specimens in  patients 58 years of age and older), is one component of a  comprehensive surveillance program. It is not intended to diagnose infection nor to guide or monitor treatment. Performed at [facility], [address], [place], [place] [address]      Labs: BNP (last 3 results) No results for input(s): BNP in the last 8760 hours. Basic Metabolic Panel: Recent Labs  Lab 11/20/19 0509 11/21/19 0601 11/22/19 0804 11/23/19 0811 11/24/19 0606  NA 138 137 133* 133* 137  K 3.6 3.6 3.3* 3.5 3.9  CL 102 105 102 102 104  CO2 23 22 22 22 24   GLUCOSE 191* 107* 114* 132* 133*  BUN 10 12 13 10 10   CREATININE 0.66 0.67 0.79 0.78 0.81  CALCIUM 9.5 8.6* 8.6* 8.6* 8.5*  MG  --  1.5* 2.1 2.2 2.2   Liver Function Tests: Recent Labs  Lab 11/20/19 0509 11/21/19 0601  AST 19 14*  ALT 26 18  ALKPHOS 79 69  BILITOT 0.7 1.3*  PROT 9.2* 7.3  ALBUMIN 4.2 3.1*   No results for input(s): LIPASE, AMYLASE in the last 168 hours. No results for input(s): AMMONIA in the last 168 hours. CBC: Recent Labs  Lab 11/20/19 0509 11/21/19 0601 11/22/19 0804 11/23/19 0811 11/24/19 0606  WBC 14.5* 28.4* 27.9* 18.8* 13.9*  NEUTROABS 12.0*  --   --  15.7* 10.7*  HGB 15.3* 12.4 11.2* 11.8* 11.4*  HCT 45.6 39.3 34.9* 36.3 36.0  MCV 87.7 88.7 88.1 87.7 87.6  PLT 245 234 252 278 307   Cardiac Enzymes: No results for input(s): CKTOTAL, CKMB, CKMBINDEX, TROPONINI in the last 168 hours. BNP: Invalid input(s): POCBNP CBG: Recent Labs  Lab 11/23/19 1143 11/23/19 1630 11/23/19 2119 11/24/19 0712 11/24/19 0806  GLUCAP 89 131* 186* 109* 104*   D-Dimer No results for input(s): DDIMER in the last 72 hours. Hgb A1c No results for input(s): HGBA1C in the last 72 hours. Lipid Profile No results for input(s): CHOL, HDL, LDLCALC, TRIG, CHOLHDL, LDLDIRECT in the last 72 hours. Thyroid function studies No results for input(s): TSH, T4TOTAL, T3FREE, THYROIDAB in the last 72 hours.  Invalid input(s): FREET3 Anemia work up No results for input(s): VITAMINB12, FOLATE,  FERRITIN, TIBC, IRON, RETICCTPCT in the last 72 hours. Urinalysis    Component Value Date/Time   COLORURINE YELLOW 11/20/2019 0509   APPEARANCEUR HAZY (A) 11/20/2019 0509   LABSPEC 1.006 11/20/2019 0509   PHURINE 5.0 11/20/2019 0509   GLUCOSEU >=500 (A) 11/20/2019 0509   HGBUR NEGATIVE 11/20/2019 0509   HGBUR negative 12/30/2006 1457   BILIRUBINUR NEGATIVE 11/20/2019 0509   BILIRUBINUR negative 12/06/2015 1204   BILIRUBINUR neg 10/24/2014 1332   KETONESUR NEGATIVE 11/20/2019 0509   PROTEINUR 100 (A) 11/20/2019 0509   UROBILINOGEN 0.2 12/06/2015 1204   UROBILINOGEN 0.2 02/04/2015 1413   NITRITE NEGATIVE 11/20/2019 0509   LEUKOCYTESUR NEGATIVE 11/20/2019 0509   Sepsis Labs Invalid input(s): PROCALCITONIN,  WBC,  LACTICIDVEN Microbiology Recent Results (from the past 240 hour(s))  SARS Coronavirus 2 by RT PCR (hospital order, performed in [facility] hospital lab) Nasopharyngeal Nasopharyngeal Swab     Status: None   Collection Time: 11/20/19  6:32 AM   Specimen: Nasopharyngeal Swab  Result Value Ref Range Status   SARS Coronavirus 2 NEGATIVE NEGATIVE Final    Comment: (NOTE) SARS-CoV-2 target nucleic acids are NOT DETECTED.  The SARS-CoV-2 RNA is generally detectable in upper and lower respiratory specimens during the acute phase of infection. The lowest concentration of SARS-CoV-2 viral  copies this assay can detect is 250 copies / mL. A negative result does not preclude SARS-CoV-2 infection and should not be used as the sole basis for treatment or other patient management decisions.  A negative result may occur with improper specimen collection / handling, submission of specimen other than nasopharyngeal swab, presence of viral mutation(s) within the areas targeted by this assay, and inadequate number of viral copies (<250 copies / mL). A negative result must be combined with clinical observations, patient history, and epidemiological information.  Fact Sheet for Patients:    StrictlyIdeas.no  Fact Sheet for Healthcare Providers: BankingDealers.co.za  This test is not yet approved or  cleared by the [place] FDA and has been authorized for detection and/or diagnosis of SARS-CoV-2 by FDA under an Emergency Use Authorization (EUA).  This EUA will remain in effect (meaning this test can be used) for the duration of the COVID-19 declaration under Section 564(b)(1) of the Act, 21 U.S.C. section 360bbb-3(b)(1), unless the authorization is terminated or revoked sooner.  Performed at [facility], [address], [place], [place] [address]   MRSA PCR Screening     Status: None   Collection Time: 11/20/19  1:04 PM   Specimen: Nasopharyngeal  Result Value Ref Range Status   MRSA by PCR NEGATIVE NEGATIVE Final    Comment:        The GeneXpert MRSA Assay (FDA approved for NASAL specimens only), is one component of a comprehensive MRSA colonization surveillance program. It is not intended to diagnose MRSA infection nor to guide or monitor treatment for MRSA infections. Performed at [facility], [address] [place], [place] [address]   Culture, blood (routine x 2)     Status: None (Preliminary result)   Collection Time: 11/20/19  2:10 PM   Specimen: Right Antecubital; Blood  Result Value Ref Range Status   Specimen Description   Final    RIGHT ANTECUBITAL BOTTLES DRAWN AEROBIC AND ANAEROBIC   Special Requests   Final    Blood Culture results may not be optimal due to an inadequate volume of blood received in culture bottles   Culture   Final    NO GROWTH 3 DAYS Performed at [facility], [address], [place], [place] [address]    Report Status PENDING  Incomplete  Culture, blood (routine x 2)     Status: None (Preliminary result)   Collection Time: 11/20/19  2:17 PM   Specimen: BLOOD RIGHT HAND  Result Value Ref Range Status   Specimen Description   Final    BLOOD RIGHT HAND BOTTLES DRAWN  AEROBIC AND ANAEROBIC   Special Requests   Final    Blood Culture results may not be optimal due to an inadequate volume of blood received in culture bottles   Culture   Final    NO GROWTH 3 DAYS Performed at [facility], [address], [place], [place] [address]    Report Status PENDING  Incomplete  Surgical pcr screen     Status: None   Collection Time: 11/22/19 10:18 PM   Specimen: Nasal Mucosa; Nasal Swab  Result Value Ref Range Status   MRSA, PCR NEGATIVE NEGATIVE Final   Staphylococcus aureus NEGATIVE NEGATIVE Final    Comment: (NOTE) The Xpert SA Assay (FDA approved for NASAL specimens in patients 64 years of age and older), is one component of a comprehensive surveillance program. It is not intended to diagnose infection nor to guide or monitor treatment. Performed at [facility],  [address], [place], [place] [address]      Time coordinating discharge: 35 minutes  SIGNED:   Rodena Goldmann, DO [facility] 11/24/2019, 10:20 AM  If 7PM-7AM, please contact night-coverage [email or web address]

## 2019-11-24 NOTE — Progress Notes (Signed)
1 Day Post-Op  Subjective: No complaints of incisional pain.  Objective: Vital signs in last 24 hours: Temp:  [98 F (36.7 C)-99.2 F (37.3 C)] 98.2 F (36.8 C) (08/16 2116) Pulse Rate:  [68-80] 75 (08/16 2116) Resp:  [16-24] 16 (08/16 2116) BP: (94-119)/(60-72) 94/60 (08/16 2116) SpO2:  [93 %-100 %] 100 % (08/16 2116) Weight:  [94 kg] 94 kg (08/16 2116) Last BM Date: 11/21/19  Intake/Output from previous day: 08/16 0701 - 08/17 0700 In: 750 [P.O.:350; I.V.:400] Out: 2 [Blood:2] Intake/Output this shift: No intake/output data recorded.  General appearance: alert, cooperative and no distress Incision/Wound: Incision healing well.  No hematoma present. Lab Results:  Recent Labs    11/23/19 0811 11/24/19 0606  WBC 18.8* 13.9*  HGB 11.8* 11.4*  HCT 36.3 36.0  PLT 278 307   BMET Recent Labs    11/23/19 0811 11/24/19 0606  NA 133* 137  K 3.5 3.9  CL 102 104  CO2 22 24  GLUCOSE 132* 133*  BUN 10 10  CREATININE 0.78 0.81  CALCIUM 8.6* 8.5*   PT/INR No results for input(s): LABPROT, INR in the last 72 hours.  Studies/Results: [place] RT LOWER EXTREM LTD SOFT TISSUE NON VASCULAR  Result Date: 11/22/2019 CLINICAL DATA:  Cellulitis of the right groin. Evaluate for abscess. EXAM: ULTRASOUND RIGHT LOWER EXTREMITY LIMITED TECHNIQUE: Ultrasound examination of the lower extremity soft tissues was performed in the area of clinical concern. COMPARISON:  None. FINDINGS: No fluid collection or hematoma.  No solid or cystic mass. Prominent right inguinal lymph node measuring 1.5 cm in short axis with normal morphology. No significant increased Doppler flow. No other enlarged lymph nodes. IMPRESSION: 1. No right inguinal fluid collection to suggest an abscess. 2. Prominent right inguinal lymph node measuring 15 mm in short axis with normal morphology likely reflecting a reactive lymph node. Electronically Signed   By: Kathreen Devoid   On: 11/22/2019 13:16     Anti-infectives: Anti-infectives (From admission, onward)   Start     Dose/Rate Route Frequency Ordered Stop   11/22/19 1900  vancomycin (VANCOCIN) IVPB 1000 mg/200 mL premix  Status:  Discontinued        1,000 mg 200 mL/hr over 60 Minutes Intravenous  Once 11/22/19 1847 11/22/19 1856   11/21/19 0100  vancomycin (VANCOCIN) IVPB 1000 mg/200 mL premix  Status:  Discontinued        1,000 mg 200 mL/hr over 60 Minutes Intravenous Every 12 hours 11/20/19 1416 11/22/19 1845   11/20/19 1600  vancomycin (VANCOCIN) IVPB 1000 mg/200 mL premix        1,000 mg 200 mL/hr over 60 Minutes Intravenous  Once 11/20/19 1415 11/20/19 1702   11/20/19 1330  vancomycin (VANCOCIN) IVPB 1000 mg/200 mL premix        1,000 mg 200 mL/hr over 60 Minutes Intravenous  Once 11/20/19 1323 11/20/19 1450   11/20/19 0745  clindamycin (CLEOCIN) IVPB 600 mg        600 mg 100 mL/hr over 30 Minutes Intravenous  Once 11/20/19 0731 11/20/19 0813      Assessment/Plan: s/p Procedure(s): INGUINAL LYMPH NODE BIOPSY Impression: Stable, status post right inguinal lymph node biopsy.  Final pathology pending.  Her leukocytosis has significantly improved.  Okay for discharge from surgery standpoint.  LOS: 4 days    Aviva Signs 11/24/2019

## 2019-11-25 ENCOUNTER — Ambulatory Visit: Payer: Medicare Other

## 2019-11-25 ENCOUNTER — Ambulatory Visit: Payer: Medicare Other | Admitting: Occupational Therapy

## 2019-11-25 LAB — CULTURE, BLOOD (ROUTINE X 2)
Culture: NO GROWTH
Culture: NO GROWTH

## 2019-11-26 ENCOUNTER — Ambulatory Visit: Payer: Medicare Other

## 2019-11-26 ENCOUNTER — Encounter: Payer: Medicare Other | Admitting: Occupational Therapy

## 2019-11-26 LAB — SURGICAL PATHOLOGY

## 2019-12-01 ENCOUNTER — Ambulatory Visit: Payer: Medicare Other

## 2019-12-01 ENCOUNTER — Ambulatory Visit: Payer: Medicare Other | Admitting: Occupational Therapy

## 2019-12-03 ENCOUNTER — Encounter: Payer: Self-pay | Admitting: Occupational Therapy

## 2019-12-03 ENCOUNTER — Ambulatory Visit: Payer: Medicare Other | Admitting: Physical Therapy

## 2019-12-03 ENCOUNTER — Other Ambulatory Visit: Payer: Self-pay

## 2019-12-03 ENCOUNTER — Ambulatory Visit: Payer: Medicare Other | Admitting: Occupational Therapy

## 2019-12-03 DIAGNOSIS — R29818 Other symptoms and signs involving the nervous system: Secondary | ICD-10-CM

## 2019-12-03 DIAGNOSIS — R2681 Unsteadiness on feet: Secondary | ICD-10-CM

## 2019-12-03 DIAGNOSIS — M6281 Muscle weakness (generalized): Secondary | ICD-10-CM | POA: Diagnosis present

## 2019-12-03 DIAGNOSIS — I69354 Hemiplegia and hemiparesis following cerebral infarction affecting left non-dominant side: Secondary | ICD-10-CM

## 2019-12-03 DIAGNOSIS — R278 Other lack of coordination: Secondary | ICD-10-CM | POA: Diagnosis present

## 2019-12-03 DIAGNOSIS — M25512 Pain in left shoulder: Secondary | ICD-10-CM | POA: Diagnosis present

## 2019-12-03 DIAGNOSIS — R2689 Other abnormalities of gait and mobility: Secondary | ICD-10-CM | POA: Diagnosis present

## 2019-12-03 DIAGNOSIS — R293 Abnormal posture: Secondary | ICD-10-CM

## 2019-12-03 DIAGNOSIS — R29898 Other symptoms and signs involving the musculoskeletal system: Secondary | ICD-10-CM | POA: Diagnosis present

## 2019-12-03 NOTE — Therapy (Signed)
[facility] [address] [place], [place], [address] Phone: [phone]   Fax:  [phone]  Occupational Therapy Treatment  Patient Details  Name: Ann Lopez MRN: [id number] Date of Birth: February 09, 1971 Referring Provider (OT): Dr. Leeroy Cha   Encounter Date: 12/03/2019   OT End of Session - 12/03/19 1554    Visit Number 14    Number of Visits 25    Date for OT Re-Evaluation 12/25/19    Authorization Type [employer] Medicare/ Medicaid, covered 100% (medicare guidelines)    Authorization Time Period cert. date 09/25/19-12/24/19.   **Week 6/8    Authorization - Number of Visits 14    Progress Note Due on Visit 20    OT Start Time 7116    OT Stop Time 1530    OT Time Calculation (min) 45 min    Activity Tolerance Patient tolerated treatment well    Behavior During Therapy WFL for tasks assessed/performed           Past Medical History:  Diagnosis Date  . Allergy   . Anxiety   . Asthma   . Back pain   . Breast cancer ([place])   . Cancer ([place])   . Depression   . Diabetes mellitus   . Headache   . Hyperlipemia   . Osteopenia   . Osteoporosis    osteopenia  . Stroke [facility])     Past Surgical History:  Procedure Laterality Date  . ABDOMINAL HYSTERECTOMY     B oophorectomy for BRCA1 gene  . BREAST SURGERY    . CESAREAN SECTION    . INCISION AND DRAINAGE ABSCESS Left 11/14/2012   Procedure: INCISION AND DRAINAGE ABSCESS LEFT GROIN;  Surgeon: Jamesetta So, MD;  Location: [facility];  Service: General;  Laterality: Left;  . INGUINAL LYMPH NODE BIOPSY Right 11/23/2019   Procedure: INGUINAL LYMPH NODE BIOPSY;  Surgeon: Aviva Signs, MD;  Location: [facility];  Service: General;  Laterality: Right;  . IR ANGIO VERTEBRAL SEL SUBCLAVIAN INNOMINATE UNI R MOD SED  03/02/2019  . IR CT HEAD LTD  03/02/2019  . IR INTRAVSC STENT CERV CAROTID W/O EMB-PROT MOD SED INC ANGIO  03/02/2019  . IR PERCUTANEOUS ART THROMBECTOMY/INFUSION  INTRACRANIAL INC DIAG ANGIO  03/02/2019  . LYMPHADENECTOMY    . MASTECTOMY Bilateral   . RADIOLOGY WITH ANESTHESIA N/A 03/02/2019   Procedure: IR WITH ANESTHESIA;  Surgeon: Luanne Bras, MD;  Location: [facility];  Service: Radiology;  Laterality: N/A;    There were no vitals filed for this visit.   Subjective Assessment - 12/03/19 1532    Subjective  Patient was recently hospitalized for surgery for infected inguinal lymph node.    Pertinent History R MCA  stroke with L shoulder pain.  PMH:  diabetes, anxiety, depression hyperlipidemia, smoking, breast cancer s/p bilateral mastectomy followed by chmo in 2009, R carotid stenosis requiring emergent thrombectomy    Limitations fall risk, pain    Patient Stated Goals improve L shoudler pain and ROM, be able to care for grandchild alone    Currently in Pain? No/denies    Pain Score 0-No pain                        OT Treatments/Exercises (OP) - 12/03/19 1540      Neurological Re-education Exercises   Other Exercises 1 Neuromuscular reeducation to address AAROM in left shoulder.  In supine atient able to allow humerus to rest into  dependent position - elevated slightly into flexion supported on pillow - then worked to [employer] internal and extrernal rotation.  In sitting worked on maintaining more neutral rotation of humerus, and aligning shoulder girdle for active assisted shoulder flex with elbow extension.  Patient has difficulty coordination these two motions simultaneously as needed for reach.  Patient today tends to initiate movement with shoulder girdle elevation - used active support (UE Ranger) to encourage more scap depression.      Other Exercises 2 Light weight bearing through left forearm then hand to promote stretching via body on arm.  Patient able to maintain better alignment in closed chain position - sidelying on forearm, and 4 point, rocking backward.  Patient with only moments of ipain with new positions this  session.        Manual Therapy   Manual Therapy Soft tissue mobilization    Soft tissue mobilization Lateral humerus to reduce muscle knots/tension.      Passive ROM To increase humeral rotation.  Patient did best with slight traction throughout motion.                      OT Short Term Goals - 12/03/19 1557      OT SHORT TERM GOAL #1   Title Pt will be independent with initial HEP.--check STGs 11/13/19 (extended due to scheduling)    Time 4    Period Weeks    Status Achieved      OT SHORT TERM GOAL #2   Title Pt will demo at least 70* L shoulder flexion for functional reaching without pain.    Baseline 40*    Time 4    Period Weeks    Status Achieved   10/29/19:  approx 90*     OT SHORT TERM GOAL #3   Title Pt will verbalize understanding of proper positioning of LUE for decr pain.    Time 4    Period Weeks    Status Achieved      OT SHORT TERM GOAL #4   Title Pt will be able to use LUE in simple light home maintenance tasks as non-dominant assist at least 50% of the time with pain less than 3/10 (wiping table, folding clothes, sweeping).    Time 4    Period Weeks    Status Achieved      OT SHORT TERM GOAL #5   Title Pt will demo improved LUE functional reaching for ADLs as shown by improving score on box and blocks test by at least 6.    Baseline 28 blocks    Time 4    Period Weeks    Status Achieved   36     OT SHORT TERM GOAL #6   Title Pt will verbalize understanding of adaptive strategies/AE to incr independence with cooking tasks.    Time 4    Period Weeks    Status Achieved             OT Long Term Goals - 12/03/19 1557      OT LONG TERM GOAL #1   Title Pt will be independent with updated HEP for LUE--check LTGs 11/24/19    Time 8    Period Weeks    Status New      OT LONG TERM GOAL #2   Title Pt will demo at least 90* L shoulder flex without pain for functional reaching.    Baseline 40*    Time 8  Period Weeks    Status New       OT LONG TERM GOAL #3   Title Pt will be able to use LUE as nondominant assist for functional tasks at least 75% of the time for light ADLs and IADLs with pain consistently less than or equal to 2/10.    Time 8    Period Weeks    Status New      OT LONG TERM GOAL #4   Title Pt will improve L grip strength by at least 8lbs to assist in opening containers/home maintenance tasks.    Baseline 42.7lbs    Time 8    Period Weeks    Status New      OT LONG TERM GOAL #5   Title Pt will improve LUE coordination as shown by improving time on 9-hole peg test by at least 8sec.    Baseline 40.81sec    Time 8    Period Weeks    Status New      OT LONG TERM GOAL #6   Title Pt will improve LUE functional reaching for ADLs as shown by improving score on box and blocks test by at least 10.    Baseline 28    Time 8    Period Weeks    Status New                 Plan - 12/03/19 1555    Clinical Impression Statement Patient showing improved tolerance of movement passive to active, and fewer reports of pain.  When she is describing pain, it is fleeting and in newly aquired ranges.    OT Occupational Profile and History Detailed Assessment- Review of Records and additional review of physical, cognitive, psychosocial history related to current functional performance    Occupational performance deficits (Please refer to evaluation for details): IADL's;ADL's;Leisure    Body Structure / Function / Physical Skills ADL;Balance;IADL;ROM;Strength;Tone;FMC;Coordination;UE functional use;Decreased knowledge of precautions;GMC;Decreased knowledge of use of DME;Pain    Rehab Potential Good    Clinical Decision Making Several treatment options, min-mod task modification necessary    Comorbidities Affecting Occupational Performance: May have comorbidities impacting occupational performance    Modification or Assistance to Complete Evaluation  Min-Moderate modification of tasks or assist with assess necessary to  complete eval    OT Frequency 3x / week    OT Duration 8 weeks    OT Treatment/Interventions Self-care/ADL training;Moist Heat;DME and/or AE instruction;Therapeutic activities;Aquatic Therapy;Therapeutic exercise;Ultrasound;Passive range of motion;Functional Mobility Training;Neuromuscular education;Cryotherapy;Electrical Stimulation;Manual Therapy;Patient/family education    Plan ultrasound as needed, neuro re-ed, manual therapy prn.    Consulted and Agree with Plan of Care Patient           Patient will benefit from skilled therapeutic intervention in order to improve the following deficits and impairments:   Body Structure / Function / Physical Skills: ADL, Balance, IADL, ROM, Strength, Tone, FMC, Coordination, UE functional use, Decreased knowledge of precautions, GMC, Decreased knowledge of use of DME, Pain       Visit Diagnosis: Hemiplegia and hemiparesis following cerebral infarction affecting left non-dominant side (HCC)  Abnormal posture  Acute pain of left shoulder  Other symptoms and signs involving the nervous system  Other symptoms and signs involving the musculoskeletal system  Other lack of coordination  Muscle weakness (generalized)  Unsteadiness on feet    Problem List Patient Active Problem List   Diagnosis Date Noted  . Lymph node enlargement   . Cellulitis of groin, right   .  Cellulitis   . Sepsis ([place]) 11/20/2019  . Acute inguinal lymphadenitis   . Vascular headache   . Other chronic pain   . Uncontrolled type 2 diabetes mellitus with hyperglycemia ([place])   . Labile blood glucose   . Panic disorder with agoraphobia and moderate panic attacks   . Major depressive disorder, recurrent episode, moderate ([place])   . MCI (mild cognitive impairment) with memory loss   . Acute ischemic right middle cerebral artery (MCA) stroke ([place]) 03/09/2019  . Left hemiparesis ([place])   . Bradycardia   . Tachypnea   . SIRS (systemic inflammatory response syndrome)  (HCC)   . Hypokalemia   . Diabetes mellitus type 2 in obese ([place])   . Tobacco abuse   . History of breast cancer   . Cognitive deficits   . Dysphagia, post-stroke   . Stroke (cerebrum) ([place]) 03/02/2019  . Middle cerebral artery embolism, right 03/02/2019  . Chest pain 12/19/2015  . Numbness 12/19/2015  . Femoral hernia 03/24/2014  . Dyslipidemia 09/10/2011  . BRCA1 positive 09/10/2011  . Breast cancer ([place]) 09/10/2011  . H/O gastric bypass; 05/14/11([facility]) 09/10/2011  . Essential hypertension 10/28/2008  . DEPRESSION/ANXIETY 02/19/2007  . CARPAL TUNNEL SYNDROME, RIGHT 02/19/2007  . ALLERGIC RHINITIS 02/19/2007  . GERD 02/19/2007  . IRRITABLE BOWEL SYNDROME 02/19/2007  . HERPES GENITALIS 12/30/2006  . Diabetes ([place]) 12/30/2006  . OSTEOPENIA 12/30/2006  . ANOMALY, CONGENITAL, NERVOUS SYSTEM NEC 12/30/2006    Mariah Milling, OTR/L 12/03/2019, 3:58 PM  [facility] [address] [place], [place], [address] Phone: [phone]   Fax:  [phone]  Name: Ann Lopez MRN: [id number] Date of Birth: Jun 18, 1970

## 2019-12-04 NOTE — Therapy (Signed)
[facility] [address] [place], [place], [address] Phone: [phone]   Fax:  [phone]  Physical Therapy Treatment/RE-Eval/PROGRESS NOTE  Patient Details  Name: Ann Lopez MRN: [id number] Date of Birth: December 24, 1970 Referring Provider (PT): Dr. Leeroy Cha   Encounter Date: 12/03/2019   PT End of Session - 12/04/19 1721    Visit Number 24    Number of Visits 34    Date for PT Re-Evaluation 12/23/19    Authorization Type UHC Medicare/Medicaid    Authorization Time Period 09/24/2019-12/23/2019    Progress Note Due on Visit 34   Progress note completed at 12/03/2019 visit, visit#24   PT Start Time 1402    PT Stop Time 1443    PT Time Calculation (min) 41 min    Activity Tolerance Patient tolerated treatment well;Patient limited by fatigue   fatigued at end of 6MWT   Behavior During Therapy [facility] for tasks assessed/performed           Past Medical History:  Diagnosis Date  . Allergy   . Anxiety   . Asthma   . Back pain   . Breast cancer ([place])   . Cancer ([place])   . Depression   . Diabetes mellitus   . Headache   . Hyperlipemia   . Osteopenia   . Osteoporosis    osteopenia  . Stroke [facility])     Past Surgical History:  Procedure Laterality Date  . ABDOMINAL HYSTERECTOMY     B oophorectomy for BRCA1 gene  . BREAST SURGERY    . CESAREAN SECTION    . INCISION AND DRAINAGE ABSCESS Left 11/14/2012   Procedure: INCISION AND DRAINAGE ABSCESS LEFT GROIN;  Surgeon: Jamesetta So, MD;  Location: [facility];  Service: General;  Laterality: Left;  . INGUINAL LYMPH NODE BIOPSY Right 11/23/2019   Procedure: INGUINAL LYMPH NODE BIOPSY;  Surgeon: Aviva Signs, MD;  Location: [facility];  Service: General;  Laterality: Right;  . IR ANGIO VERTEBRAL SEL SUBCLAVIAN INNOMINATE UNI R MOD SED  03/02/2019  . IR CT HEAD LTD  03/02/2019  . IR INTRAVSC STENT CERV CAROTID W/O EMB-PROT MOD SED INC ANGIO  03/02/2019  . IR PERCUTANEOUS  ART THROMBECTOMY/INFUSION INTRACRANIAL INC DIAG ANGIO  03/02/2019  . LYMPHADENECTOMY    . MASTECTOMY Bilateral   . RADIOLOGY WITH ANESTHESIA N/A 03/02/2019   Procedure: IR WITH ANESTHESIA;  Surgeon: Luanne Bras, MD;  Location: [facility];  Service: Radiology;  Laterality: N/A;    There were no vitals filed for this visit.   Subjective Assessment - 12/03/19 1404    Subjective Had to have surgery last week due to infected lymphnode on R groin. Was very tired last week after getting out of the hospital, but better this week.  No restrictions/precautions; brought in letter with PT clearance today.    Pertinent History VZC:HYIFOYDX, hyperlipidemia, smoking, breast cancer, R carotid stenosis requiring emergent thrombectomy; 11/23/2019:  surgery to remove infected lymph node R groin    Patient Stated Goals Pt wants all the movement back in L side.    Currently in Pain? No/denies    Pain Onset In the past 7 days              [facility] PT Assessment - 12/03/19 1408      6 Minute Walk- Baseline   6 Minute Walk- Baseline yes      6 Minute walk- Post Test   6 Minute Walk Post Test yes  HR (bpm) 105    02 Sat (%RA) 98 %    Modified Borg Scale for Dyspnea 1- Very mild shortness of breath    Perceived Rate of Exertion (Borg) 13- Somewhat hard      6 minute walk test results    Aerobic Endurance Distance Walked 1200    Endurance additional comments 1 sitting break, 45 seconds, rates discomfort in R hip/back as 5/10.       Functional Gait  Assessment   Gait assessed  Yes    Gait Level Surface Walks 20 ft in less than 7 sec but greater than 5.5 sec, uses assistive device, slower speed, mild gait deviations, or deviates 6-10 in outside of the 12 in walkway width.   6.09   Change in Gait Speed Able to smoothly change walking speed without loss of balance or gait deviation. Deviate no more than 6 in outside of the 12 in walkway width.    Gait with Horizontal Head Turns Performs head turns smoothly  with no change in gait. Deviates no more than 6 in outside 12 in walkway width    Gait with Vertical Head Turns Performs head turns with no change in gait. Deviates no more than 6 in outside 12 in walkway width.    Gait and Pivot Turn Pivot turns safely within 3 sec and stops quickly with no loss of balance.    Step Over Obstacle Is able to step over one shoe box (4.5 in total height) without changing gait speed. No evidence of imbalance.    Gait with Narrow Base of Support Is able to ambulate for 10 steps heel to toe with no staggering.    Gait with Eyes Closed Walks 20 ft, uses assistive device, slower speed, mild gait deviations, deviates 6-10 in outside 12 in walkway width. Ambulates 20 ft in less than 9 sec but greater than 7 sec.   7.97   Ambulating Backwards Walks 20 ft, uses assistive device, slower speed, mild gait deviations, deviates 6-10 in outside 12 in walkway width.   14   Steps Alternating feet, must use rail.    Total Score 25    FGA comment: Scores <22/30 indicate increased fall risk.                         [facility] Adult PT Treatment/Exercise - 12/03/19 1408      Transfers   Transfers Sit to Stand;Stand to Sit    Sit to Stand 6: Modified independent (Device/Increase time);Without upper extremity assist;From chair/3-in-1    Five time sit to stand comments  10.12    Stand to Sit 6: Modified independent (Device/Increase time);Without upper extremity assist;To chair/3-in-1      Ambulation/Gait   Ambulation/Gait Yes    Ambulation/Gait Assistance 5: Supervision    Assistive device None    Gait Pattern Step-through pattern;Decreased stance time - left;Decreased arm swing - left;Trendelenburg;Lateral trunk lean to left    Ambulation Surface Level;Indoor    Gait velocity 9.16 sec = 3.58 ft/sec    Gait Comments With increased gait distance in 6MWT, pt describes pain in R hip as 4-5/10 .      Self-Care   Self-Care Other Self-Care Comments    Other Self-Care  Comments  Discussed pt's POC, that recert will be completed this visit, since pt has been out of therapy due to illness and infection; she returns today, with goals to continue working on walking and to have less limp,  to be able to play with and keep her 2 yo grandchild (which would be more confident faster walking)                  PT Education - 12/04/19 1718    Education Details POC and renewal for PT to address pt's goals Imore normal walking with less limp, being able to keep up with her grandchild to babysit her)    Person(s) Educated Patient    Methods Explanation    Comprehension Verbalized understanding            PT Short Term Goals - 10/21/19 0853      PT SHORT TERM GOAL #1   Title Pt will be independent with HEP for improved balance, strength, gait.  TARGET 5 weeks for all LTGs: 10/23/2019    Baseline 10/21/19: pt. verbalizes independence with HEP.    Time 5    Period Weeks    Status Achieved      PT SHORT TERM GOAL #2   Title Pt will improve 5x sit<>stand score to less than or equal to 12 seconds for improved functional strength.    Baseline 10/21/19: 12.31 seconds to complete 5x sit<>stand.    Time 5    Period Weeks    Status Partially Met      PT SHORT TERM GOAL #3   Title Pt will improve FGA score to at least 20/30 for decreased fall risk.    Baseline 10/21/19: pt. scored a 24/30 on the FGA today showing a decrease in fall risk.    Time 5    Period Weeks    Status Achieved      PT SHORT TERM GOAL #4   Title Pt will verbalize understanding of fall prevention in home environment.    Baseline 10/21/19: pt. verbalizes fall prevention techniques like using a rubber mat in the shower, and handrails when available.    Time 5    Period Weeks    Status Achieved             PT Long Term Goals - 12/04/19 1728      PT LONG TERM GOAL #1   Title Pt will be independent with progression of HEP for strength, balance, and gait.  TARGET 11/20/2019    Baseline  Verbally reviewed, but did not have time to review 12/03/2019 visit    Time 9    Period Weeks    Status On-going      PT LONG TERM GOAL #2   Title Pt will improve 5x sit<>stand score to less than or equal to 10 seconds for improved functional strength.    Baseline 10.79 11/19/2019; 10.12 sec 12/03/2019    Time 9    Period Weeks    Status Partially Met      PT LONG TERM GOAL #3   Title Pt will improve Functional Gait Assessment score to at least 22/30 for decreased fall risk.    Baseline 24/30 11/19/2019    Time 9    Period Weeks    Status Achieved      PT LONG TERM GOAL #4   Title Pt will improve gait velocity to at least 3 ft/sec for improved gait efficiency for community ambulation.    Baseline 3.27 ft/sec; 3.58 ft/sec 12/03/2019    Time 9    Period Weeks    Status Achieved      PT LONG TERM GOAL #5   Title Pt will ambulate at least 1000 ft,  outdoor and indoor surfaces, appropriate device as needed, modified independently, for improved community gait.    Baseline Not addressed due to heat today; pt does walk 1200 ft in 6 MWT    Time 9    Period Weeks    Status On-going      PT LONG TERM GOAL #6   Title FOTO score to improve by at least 10 % for improved overall reported functional mobility.    Time 9    Period Weeks    Status On-going                 Plan - 12/04/19 1725    Clinical Impression Statement RECERT AND PROGRESS NOTE (covering 5 visits, from 11/05/2019-12/03/2019):  Pt returns today after several weeks out of therapy, due to hospitalization due to infected R groin.  She has return to PT clearance from her physician.  Assessed functional measures and goals this visit:  5x sit<>stand 10.12 sec, gait velocity 3.58 ft/sec, 6 MWT 1200 ft (1 sitting break and 4-5/10 R hip pain), FGA 25/30.  Pt has overall made nice progress in the course of therapy; however, prior to pt's hospitlaization, pt and PT discussed continued therapy, to address pt's updated goals to include  being able to improve trasnsitions and fast gait for playing with her 2yo grandchild as well as continued improvement in gait.  Pt has met LTGs 3 and 4, partially met LTG 2.  LTG 1, 5 and 6 ongoing, not yet fully met/addressed.  Pt will continue to benefit from additional skilled PT to further address strength, posture, balance, gait for improved functional mobility and return to independent activities iwth family and in community.    Personal Factors and Comorbidities Comorbidity 3+    Comorbidities PMH:  diabetes, hyperlipidemia, smoking, breast cancer, R carotid stenosis requiring emergent thrombectomy, anxiety depression    Examination-Activity Limitations Locomotion Level;Transfers;Reach Overhead;Stand;Stairs;Lift;Dressing    Examination-Participation Restrictions Community Activity;Other   caring for grandchild   Stability/Clinical Decision Making Evolving/Moderate complexity    Rehab Potential Good    PT Frequency Other (comment)   1x/wk (wk of 8/27) and then 2x/wk for 5 weeks   PT Duration 6 weeks   total POC = 6 weeks   PT Treatment/Interventions ADLs/Self Care Home Management;Electrical Stimulation;DME Instruction;Gait training;Stair training;Functional mobility training;Therapeutic activities;Therapeutic exercise;Balance training;Neuromuscular re-education;Manual techniques;Orthotic Fit/Training;Patient/family education;Passive range of motion;Aquatic Therapy    PT Next Visit Plan HEP check and upgrade as needed.  LLE and trunk strengthening, balance on compliant surfaces; gait at fast speeds, focus on trunk control with gait    PT Home Exercise Plan Access Code: [email or web address]    Consulted and Agree with Plan of Care Patient           Patient will benefit from skilled therapeutic intervention in order to improve the following deficits and impairments:  Abnormal gait, Decreased coordination, Difficulty walking, Impaired tone, Impaired UE functional use, Decreased activity tolerance, Decreased  balance, Decreased mobility, Decreased strength, Postural dysfunction  Visit Diagnosis: Other abnormalities of gait and mobility  Unsteadiness on feet  Abnormal posture  Muscle weakness (generalized)  Other symptoms and signs involving the nervous system     Problem List Patient Active Problem List   Diagnosis Date Noted  . Lymph node enlargement   . Cellulitis of groin, right   . Cellulitis   . Sepsis ([place]) 11/20/2019  . Acute inguinal lymphadenitis   . Vascular headache   . Other chronic pain   . Uncontrolled type 2  diabetes mellitus with hyperglycemia ([place])   . Labile blood glucose   . Panic disorder with agoraphobia and moderate panic attacks   . Major depressive disorder, recurrent episode, moderate ([place])   . MCI (mild cognitive impairment) with memory loss   . Acute ischemic right middle cerebral artery (MCA) stroke ([place]) 03/09/2019  . Left hemiparesis ([place])   . Bradycardia   . Tachypnea   . SIRS (systemic inflammatory response syndrome) ([facility])   . Hypokalemia   . Diabetes mellitus type 2 in obese ([place])   . Tobacco abuse   . History of breast cancer   . Cognitive deficits   . Dysphagia, post-stroke   . Stroke (cerebrum) ([place]) 03/02/2019  . Middle cerebral artery embolism, right 03/02/2019  . Chest pain 12/19/2015  . Numbness 12/19/2015  . Femoral hernia 03/24/2014  . Dyslipidemia 09/10/2011  . BRCA1 positive 09/10/2011  . Breast cancer ([place]) 09/10/2011  . H/O gastric bypass; 05/14/11([facility]) 09/10/2011  . Essential hypertension 10/28/2008  . DEPRESSION/ANXIETY 02/19/2007  . CARPAL TUNNEL SYNDROME, RIGHT 02/19/2007  . ALLERGIC RHINITIS 02/19/2007  . GERD 02/19/2007  . IRRITABLE BOWEL SYNDROME 02/19/2007  . HERPES GENITALIS 12/30/2006  . Diabetes ([place]) 12/30/2006  . OSTEOPENIA 12/30/2006  . ANOMALY, CONGENITAL, NERVOUS SYSTEM NEC 12/30/2006    Jissel Slavens W. 12/04/2019, 5:39 PM Frazier Butt., PT  [facility] [address] [place], [place], [address] Phone: [phone]   Fax:  [phone]  Name: MACRINA LEHNERT MRN: [id number] Date of Birth: [birth year]    PT Short Term Goals - 12/04/19 1741      PT SHORT TERM GOAL #1   Title Pt will be independent with progression of HEP for improved balance, strength, gait.  TARGET 3 weeks:  12/18/2019    Baseline 10/21/19: pt. verbalizes independence with HEP.    Time 3    Period Weeks    Status Revised      PT SHORT TERM GOAL #2   Title Pt will improve 5x sit<>stand score to less than or equal to 10 seconds for improved functional strength.    Baseline 12/03/2019:  10.12 sec    Time 3    Period Weeks    Status Revised      PT SHORT TERM GOAL #3   Title Pt will ambulate at least 4 ft/sec for improved ability to follow toddler grandchild with caregiving.    Baseline 3.58 ft/sec    Time 3    Period Weeks    Status New           PT Long Term Goals - 12/04/19 1744      PT LONG TERM GOAL #1   Title Pt will verbalize plans for continued community fitness upon d/c from PT, to maximize gains made in PT.  TARGET 6 weeks, = 01/08/2020    Time 6    Period Weeks    Status New      PT LONG TERM GOAL #2   Title Pt will perform 6 MWT, at least 1250 ft, with no more than 2 point increase in pain for improved posture and efficiency with gait.    Baseline 1200 ft, 4-5/10 pain.    Time 6    Period Weeks    Status New      PT LONG TERM GOAL #3   Title Pt will demo ability to jog 20 ft independently, for improved ability to help care for 2 yo grandchild.    Time 6  Period Weeks    Status New      PT LONG TERM GOAL #4   Title FOTO score to improve by at least 10 % for improved overall reported functional mobility.    Time 6    Period Weeks    Status New      PT LONG TERM GOAL #5   Title Pt will ambulate at least 1000 ft, outdoor and indoor surfaces, appropriate device as needed, modified independently, for  improved community gait.    Baseline Not addressed due to heat today; pt does walk 1200 ft in 6 MWT    Time 6    Period Weeks    Status On-going          Mady Haagensen, [place] 12/04/19 5:49 PM Phone: [phone] Fax: [phone]

## 2019-12-07 ENCOUNTER — Ambulatory Visit: Payer: Medicare Other | Admitting: Occupational Therapy

## 2019-12-07 ENCOUNTER — Ambulatory Visit: Payer: Medicare Other | Admitting: Physical Therapy

## 2019-12-07 ENCOUNTER — Encounter: Payer: Self-pay | Admitting: Occupational Therapy

## 2019-12-07 ENCOUNTER — Other Ambulatory Visit: Payer: Self-pay

## 2019-12-07 DIAGNOSIS — R278 Other lack of coordination: Secondary | ICD-10-CM

## 2019-12-07 DIAGNOSIS — R293 Abnormal posture: Secondary | ICD-10-CM

## 2019-12-07 DIAGNOSIS — R2681 Unsteadiness on feet: Secondary | ICD-10-CM

## 2019-12-07 DIAGNOSIS — R29898 Other symptoms and signs involving the musculoskeletal system: Secondary | ICD-10-CM

## 2019-12-07 DIAGNOSIS — I69354 Hemiplegia and hemiparesis following cerebral infarction affecting left non-dominant side: Secondary | ICD-10-CM

## 2019-12-07 DIAGNOSIS — R29818 Other symptoms and signs involving the nervous system: Secondary | ICD-10-CM

## 2019-12-07 DIAGNOSIS — M6281 Muscle weakness (generalized): Secondary | ICD-10-CM

## 2019-12-07 DIAGNOSIS — M25512 Pain in left shoulder: Secondary | ICD-10-CM

## 2019-12-07 NOTE — Patient Instructions (Signed)
      Grip Strengthening (Resistive Putty)   Squeeze putty using thumb and all fingers. Repeat 15 times. Do 1-2 sessions per day.   Extension (Assistive Putty)   Roll putty back and forth, being sure to use all fingertips. Repeat 2 times. Do 1-2 sessions per day.  Then pinch as below.   Palmar Pinch Strengthening (Resistive Putty)   Pinch putty between thumb and each fingertip in turn after rolling out

## 2019-12-07 NOTE — Therapy (Signed)
[facility] [address] [place], [place], [address] Phone: [phone]   Fax:  [phone]  Occupational Therapy Treatment  Patient Details  Name: Ann Lopez MRN: [id number] Date of Birth: [birth year] Referring Provider (OT): Dr. Leeroy Cha   Encounter Date: 12/07/2019   OT End of Session - 12/07/19 1237    Visit Number 15    Number of Visits 25    Date for OT Re-Evaluation 12/25/19    Authorization Type UHC Medicare/ Medicaid, covered 100% (medicare guidelines)    Authorization Time Period cert. date 09/25/19-12/24/19.   **Week 7/8    Authorization - Number of Visits 15    Progress Note Due on Visit 20    OT Start Time 1234    OT Stop Time 1315    OT Time Calculation (min) 41 min    Activity Tolerance Patient tolerated treatment well    Behavior During Therapy WFL for tasks assessed/performed           Past Medical History:  Diagnosis Date  . Allergy   . Anxiety   . Asthma   . Back pain   . Breast cancer ([place])   . Cancer ([place])   . Depression   . Diabetes mellitus   . Headache   . Hyperlipemia   . Osteopenia   . Osteoporosis    osteopenia  . Stroke [facility])     Past Surgical History:  Procedure Laterality Date  . ABDOMINAL HYSTERECTOMY     B oophorectomy for BRCA1 gene  . BREAST SURGERY    . CESAREAN SECTION    . INCISION AND DRAINAGE ABSCESS Left 11/14/2012   Procedure: INCISION AND DRAINAGE ABSCESS LEFT GROIN;  Surgeon: Jamesetta So, MD;  Location: [facility];  Service: General;  Laterality: Left;  . INGUINAL LYMPH NODE BIOPSY Right 11/23/2019   Procedure: INGUINAL LYMPH NODE BIOPSY;  Surgeon: Aviva Signs, MD;  Location: [facility];  Service: General;  Laterality: Right;  . IR ANGIO VERTEBRAL SEL SUBCLAVIAN INNOMINATE UNI R MOD SED  03/02/2019  . IR CT HEAD LTD  03/02/2019  . IR INTRAVSC STENT CERV CAROTID W/O EMB-PROT MOD SED INC ANGIO  03/02/2019  . IR PERCUTANEOUS ART THROMBECTOMY/INFUSION  INTRACRANIAL INC DIAG ANGIO  03/02/2019  . LYMPHADENECTOMY    . MASTECTOMY Bilateral   . RADIOLOGY WITH ANESTHESIA N/A 03/02/2019   Procedure: IR WITH ANESTHESIA;  Surgeon: Luanne Bras, MD;  Location: [facility];  Service: Radiology;  Laterality: N/A;    There were no vitals filed for this visit.   Subjective Assessment - 12/07/19 1236    Subjective  pt reports that shoulder has been good.  I feel like I have to really squeeze something to hold onto it    Pertinent History R MCA  stroke with L shoulder pain.  PMH:  diabetes, anxiety, depression hyperlipidemia, smoking, breast cancer s/p bilateral mastectomy followed by chmo in 2009, R carotid stenosis requiring emergent thrombectomy    Limitations fall risk, pain    Patient Stated Goals improve L shoudler pain and ROM, be able to care for grandchild alone    Currently in Pain? No/denies   treatment performed in pain free range           Soft tissue mobs to shoulder/scapula and gentle joint mobs to shoulder followed by AAROM shoulder flex with therapist providing mod facilitation at scapula for incr mobility (depression/upward rotation) for higher ranges.  Then AAROM shoulder abduction with cane with mod  facilitation at scapula for incr mobility at higher ranges.    Tall kneeling, AAROM shoulder flex to push ball forward/back followed by quadruped forward/backward wt. Shifts for incr shoulder depression and cat/cow positions for incr scapular stability and mobility and decr tone with min cueing.    Standing, AAROM shoulder flex with UE ranger with min-mod facilitation for higher ranges.  Standing shoulder ER wall stretch with min cueing       OT Education - 12/07/19 1321    Education Details Green putty HEP--see pt instructions, focus on positioning/avoiding shoulder compensation and emphasized not to overdo it    Person(s) Educated Patient    Methods Explanation;Demonstration;Handout    Comprehension Verbalized  understanding;Returned demonstration;Verbal cues required            OT Short Term Goals - 12/03/19 1557      OT SHORT TERM GOAL #1   Title Pt will be independent with initial HEP.--check STGs 11/13/19 (extended due to scheduling)    Time 4    Period Weeks    Status Achieved      OT SHORT TERM GOAL #2   Title Pt will demo at least 70* L shoulder flexion for functional reaching without pain.    Baseline 40*    Time 4    Period Weeks    Status Achieved   10/29/19:  approx 90*     OT SHORT TERM GOAL #3   Title Pt will verbalize understanding of proper positioning of LUE for decr pain.    Time 4    Period Weeks    Status Achieved      OT SHORT TERM GOAL #4   Title Pt will be able to use LUE in simple light home maintenance tasks as non-dominant assist at least 50% of the time with pain less than 3/10 (wiping table, folding clothes, sweeping).    Time 4    Period Weeks    Status Achieved      OT SHORT TERM GOAL #5   Title Pt will demo improved LUE functional reaching for ADLs as shown by improving score on box and blocks test by at least 6.    Baseline 28 blocks    Time 4    Period Weeks    Status Achieved   36     OT SHORT TERM GOAL #6   Title Pt will verbalize understanding of adaptive strategies/AE to incr independence with cooking tasks.    Time 4    Period Weeks    Status Achieved             OT Long Term Goals - 12/03/19 1557      OT LONG TERM GOAL #1   Title Pt will be independent with updated HEP for LUE--check LTGs 11/24/19    Time 8    Period Weeks    Status New      OT LONG TERM GOAL #2   Title Pt will demo at least 90* L shoulder flex without pain for functional reaching.    Baseline 40*    Time 8    Period Weeks    Status New      OT LONG TERM GOAL #3   Title Pt will be able to use LUE as nondominant assist for functional tasks at least 75% of the time for light ADLs and IADLs with pain consistently less than or equal to 2/10.    Time 8     Period Weeks  Status New      OT LONG TERM GOAL #4   Title Pt will improve L grip strength by at least 8lbs to assist in opening containers/home maintenance tasks.    Baseline 42.7lbs    Time 8    Period Weeks    Status New      OT LONG TERM GOAL #5   Title Pt will improve LUE coordination as shown by improving time on 9-hole peg test by at least 8sec.    Baseline 40.81sec    Time 8    Period Weeks    Status New      OT LONG TERM GOAL #6   Title Pt will improve LUE functional reaching for ADLs as shown by improving score on box and blocks test by at least 10.    Baseline 28    Time 8    Period Weeks    Status New                 Plan - 12/07/19 1237    Clinical Impression Statement Pt is progressing towards goals with improving pain and less spasticity overall.    OT Occupational Profile and History Detailed Assessment- Review of Records and additional review of physical, cognitive, psychosocial history related to current functional performance    Occupational performance deficits (Please refer to evaluation for details): IADL's;ADL's;Leisure    Body Structure / Function / Physical Skills ADL;Balance;IADL;ROM;Strength;Tone;FMC;Coordination;UE functional use;Decreased knowledge of precautions;GMC;Decreased knowledge of use of DME;Pain    Rehab Potential Good    Clinical Decision Making Several treatment options, min-mod task modification necessary    Comorbidities Affecting Occupational Performance: May have comorbidities impacting occupational performance    Modification or Assistance to Complete Evaluation  Min-Moderate modification of tasks or assist with assess necessary to complete eval    OT Frequency 3x / week    OT Duration 8 weeks    OT Treatment/Interventions Self-care/ADL training;Moist Heat;DME and/or AE instruction;Therapeutic activities;Aquatic Therapy;Therapeutic exercise;Ultrasound;Passive range of motion;Functional Mobility Training;Neuromuscular  education;Cryotherapy;Electrical Stimulation;Manual Therapy;Patient/family education    Plan ultrasound and manual prn, neuro re-ed, coordination HEP/functional reach with focus on shoulder positioning; begin checking goals next week for anticipated renewal    Consulted and Agree with Plan of Care Patient           Patient will benefit from skilled therapeutic intervention in order to improve the following deficits and impairments:   Body Structure / Function / Physical Skills: ADL, Balance, IADL, ROM, Strength, Tone, FMC, Coordination, UE functional use, Decreased knowledge of precautions, GMC, Decreased knowledge of use of DME, Pain       Visit Diagnosis: Abnormal posture  Hemiplegia and hemiparesis following cerebral infarction affecting left non-dominant side (HCC)  Other symptoms and signs involving the nervous system  Acute pain of left shoulder  Other symptoms and signs involving the musculoskeletal system  Other lack of coordination    Problem List Patient Active Problem List   Diagnosis Date Noted  . Lymph node enlargement   . Cellulitis of groin, right   . Cellulitis   . Sepsis ([place]) 11/20/2019  . Acute inguinal lymphadenitis   . Vascular headache   . Other chronic pain   . Uncontrolled type 2 diabetes mellitus with hyperglycemia ([place])   . Labile blood glucose   . Panic disorder with agoraphobia and moderate panic attacks   . Major depressive disorder, recurrent episode, moderate ([place])   . MCI (mild cognitive impairment) with memory loss   . Acute ischemic  right middle cerebral artery (MCA) stroke ([place]) 03/09/2019  . Left hemiparesis ([place])   . Bradycardia   . Tachypnea   . SIRS (systemic inflammatory response syndrome) (HCC)   . Hypokalemia   . Diabetes mellitus type 2 in obese ([place])   . Tobacco abuse   . History of breast cancer   . Cognitive deficits   . Dysphagia, post-stroke   . Stroke (cerebrum) ([place]) 03/02/2019  . Middle cerebral artery  embolism, right 03/02/2019  . Chest pain 12/19/2015  . Numbness 12/19/2015  . Femoral hernia 03/24/2014  . Dyslipidemia 09/10/2011  . BRCA1 positive 09/10/2011  . Breast cancer ([place]) 09/10/2011  . H/O gastric bypass; 05/14/11([facility]) 09/10/2011  . Essential hypertension 10/28/2008  . DEPRESSION/ANXIETY 02/19/2007  . CARPAL TUNNEL SYNDROME, RIGHT 02/19/2007  . ALLERGIC RHINITIS 02/19/2007  . GERD 02/19/2007  . IRRITABLE BOWEL SYNDROME 02/19/2007  . HERPES GENITALIS 12/30/2006  . Diabetes ([place]) 12/30/2006  . OSTEOPENIA 12/30/2006  . ANOMALY, CONGENITAL, NERVOUS SYSTEM NEC 12/30/2006    [facility] 12/07/2019, 1:27 PM  [facility] [address] [place], [place], [address] Phone: [phone]   Fax:  [phone]  Name: Ann Lopez MRN: [id number] Date of Birth: [birth year]   Vianne Bulls, OTR/L [facility] [address] [place], [place]  [address] [phone] phone [phone] 12/07/19 1:27 PM

## 2019-12-07 NOTE — Therapy (Signed)
[facility] [address] [place], [place], [address] Phone: [phone]   Fax:  [phone]  Physical Therapy Treatment  Patient Details  Name: Ann Lopez MRN: [id number] Date of Birth: 09/15/1970 Referring Provider (PT): Dr. Leeroy Cha   Encounter Date: 12/07/2019   PT End of Session - 12/07/19 1509    Visit Number 25    Number of Visits 34    Date for PT Re-Evaluation 62/94/76   per recert 5/46/5035   Authorization Type UHC Medicare/Medicaid    Authorization Time Period 09/24/2019-12/23/2019    Progress Note Due on Visit 34   Progress note completed at 11/05/2019 visit, #19   PT Start Time 1322   finished OT and then went to restroom   PT Stop Time 1400    PT Time Calculation (min) 38 min    Activity Tolerance Patient tolerated treatment well;Patient limited by fatigue   fatigued at end of session   Behavior During Therapy [facility] for tasks assessed/performed           Past Medical History:  Diagnosis Date  . Allergy   . Anxiety   . Asthma   . Back pain   . Breast cancer ([place])   . Cancer ([place])   . Depression   . Diabetes mellitus   . Headache   . Hyperlipemia   . Osteopenia   . Osteoporosis    osteopenia  . Stroke [facility])     Past Surgical History:  Procedure Laterality Date  . ABDOMINAL HYSTERECTOMY     B oophorectomy for BRCA1 gene  . BREAST SURGERY    . CESAREAN SECTION    . INCISION AND DRAINAGE ABSCESS Left 11/14/2012   Procedure: INCISION AND DRAINAGE ABSCESS LEFT GROIN;  Surgeon: Jamesetta So, MD;  Location: [facility];  Service: General;  Laterality: Left;  . INGUINAL LYMPH NODE BIOPSY Right 11/23/2019   Procedure: INGUINAL LYMPH NODE BIOPSY;  Surgeon: Aviva Signs, MD;  Location: [facility];  Service: General;  Laterality: Right;  . IR ANGIO VERTEBRAL SEL SUBCLAVIAN INNOMINATE UNI R MOD SED  03/02/2019  . IR CT HEAD LTD  03/02/2019  . IR INTRAVSC STENT CERV CAROTID W/O EMB-PROT MOD SED INC  ANGIO  03/02/2019  . IR PERCUTANEOUS ART THROMBECTOMY/INFUSION INTRACRANIAL INC DIAG ANGIO  03/02/2019  . LYMPHADENECTOMY    . MASTECTOMY Bilateral   . RADIOLOGY WITH ANESTHESIA N/A 03/02/2019   Procedure: IR WITH ANESTHESIA;  Surgeon: Luanne Bras, MD;  Location: [facility];  Service: Radiology;  Laterality: N/A;    There were no vitals filed for this visit.   Subjective Assessment - 12/07/19 1318    Subjective No new changes, feeling a little better overall.    Pertinent History WSF:KCLEXNTZ, hyperlipidemia, smoking, breast cancer, R carotid stenosis requiring emergent thrombectomy; 11/23/2019:  surgery to remove infected lymph node R groin    Patient Stated Goals Pt wants all the movement back in L side.    Currently in Pain? No/denies    Pain Onset In the past 7 days                      Therapeutic Exercises:(Reviewed pt's full HEP)  -Supine Lower Trunk Rotation - 1 x daily - 7 x weekly - 1 sets - 3 reps - 30 sec hold (pt verbally reviewed) -Sit to stand in stride stance - 1 x daily - 7 x weekly - 1-2 sets - 10 reps (pt performed x  10 reps-cues for hold time upon standing) -Stride Stance Weight Shift - 1 x daily - 7 x weekly - 1-2 sets - 10 reps (pt performed x 10 reps, cues for technique to avoid L knee hyperextension) -Side Stepping with Resistance at Thighs and Counter Support - 1 x daily - 5 x weekly - 1 sets - 3 reps (performed x 3 reps with green band).  Trialed sidestep squats R and L, at counter, with band resistance, with difficulty maintaining equal weightbearing (tends to lean to R) -Walking March - 1 x daily - 5 x weekly - 1 sets - 3 reps (performs x 2 reps along counter) -Tandem Walking with Counter Support - 1 x daily - 5 x weekly - 1 sets - 3 reps (performs x 2 reps along counter; then progressed to tandem march forward/back 2 reps along counter) -Standing Single Leg Stance with Counter Support - 1 x daily - 5 x weekly - 1 sets - 3 reps - 20 hold (performed  x 3 reps) Cues for posture -Step Up - 1 x daily - 5 x weekly - 1 sets - 10 reps (performed x 5 reps)  Cues for posture and hold time        [facility] Adult PT Treatment/Exercise - 12/07/19 0001      Knee/Hip Exercises: Standing   Terminal Knee Extension Strengthening;Left;2 sets;10 reps;Theraband;Limitations;3 sets   3 positions, 1 set each   Theraband Level (Terminal Knee Extension) Level 3 (Green)    Wall Squat 2 sets;10 reps    Wall Squat Limitations Pt fatigueing at end of 2nd rep, needs cues for abdominal activation to avoid trunk lean.    Other Standing Knee Exercises LLE as single limb stance, working on knee control:  RLE step taps to 6" step, then RLE posterior into runner's stretch position x 5, then repeated to 12" step taps x 5.  Tactile cues at left knee to prevent recurvatum      Ankle Exercises: Seated   Toe Raise 10 reps;3 seconds    Other Seated Ankle Exercises Performed ankle eversion, LLE 2 sets x 10 reps no resistance, cues for technique and 3 sec hold.             Also performed seated hip abduction/adduction LLE, single leg step out and in, x 10 reps         PT Short Term Goals - 12/04/19 1741      PT SHORT TERM GOAL #1   Title Pt will be independent with progression of HEP for improved balance, strength, gait.  TARGET 3 weeks:  12/18/2019    Baseline 10/21/19: pt. verbalizes independence with HEP.    Time 3    Period Weeks    Status Revised      PT SHORT TERM GOAL #2   Title Pt will improve 5x sit<>stand score to less than or equal to 10 seconds for improved functional strength.    Baseline 12/03/2019:  10.12 sec    Time 3    Period Weeks    Status Revised      PT SHORT TERM GOAL #3   Title Pt will ambulate at least 4 ft/sec for improved ability to follow toddler grandchild with caregiving.    Baseline 3.58 ft/sec    Time 3    Period Weeks    Status New             PT Long Term Goals - 12/04/19 1744  PT LONG TERM GOAL #1   Title Pt  will verbalize plans for continued community fitness upon d/c from PT, to maximize gains made in PT.  TARGET 6 weeks, = 01/08/2020    Time 6    Period Weeks    Status New      PT LONG TERM GOAL #2   Title Pt will perform 6 MWT, at least 1250 ft, with no more than 2 point increase in pain for improved posture and efficiency with gait.    Baseline 1200 ft, 4-5/10 pain.    Time 6    Period Weeks    Status New      PT LONG TERM GOAL #3   Title Pt will demo ability to jog 20 ft independently, for improved ability to help care for 2 yo grandchild.    Time 6    Period Weeks    Status New      PT LONG TERM GOAL #4   Title FOTO score to improve by at least 10 % for improved overall reported functional mobility.    Time 6    Period Weeks    Status New      PT LONG TERM GOAL #5   Title Pt will ambulate at least 1000 ft, outdoor and indoor surfaces, appropriate device as needed, modified independently, for improved community gait.    Baseline Not addressed due to heat today; pt does walk 1200 ft in 6 MWT    Time 6    Period Weeks    Status On-going                 Plan - 12/07/19 1510    Clinical Impression Statement Fully reviewed pt's HEP today, with pt performed HEP well for the most part.  She needs occasional cues for posture and hold time for maximal efficiency of exercises.  Work on some additional exercises for knee control, as pt is reproting more difficulty with L knee control and PT noting more knee recurvatum in therapy sessions.  With repetition and tactile cues for step ups and SLS activities, she improves L knee control, but continues to fatigue quickly.  She will continue to benefit from skilled PT to work towards functional goals.    Personal Factors and Comorbidities Comorbidity 3+    Comorbidities PMH:  diabetes, hyperlipidemia, smoking, breast cancer, R carotid stenosis requiring emergent thrombectomy, anxiety depression    Examination-Activity Limitations  Locomotion Level;Transfers;Reach Overhead;Stand;Stairs;Lift;Dressing    Examination-Participation Restrictions Community Activity;Other   caring for grandchild   Stability/Clinical Decision Making Evolving/Moderate complexity    Rehab Potential Good    PT Frequency Other (comment)   1x/wk (wk of 8/27) and then 2x/wk for 5 weeks   PT Duration 6 weeks   total POC = 6 weeks   PT Treatment/Interventions ADLs/Self Care Home Management;Electrical Stimulation;DME Instruction;Gait training;Stair training;Functional mobility training;Therapeutic activities;Therapeutic exercise;Balance training;Neuromuscular re-education;Manual techniques;Orthotic Fit/Training;Patient/family education;Passive range of motion;Aquatic Therapy    PT Next Visit Plan (Likely remove stagger stance sit<>stand from HEP); add to HEP (wall squats, terminal knee extension).  LLE and trunk strengthening, balance on compliant surfaces; gait at fast speeds, focus on trunk control with gait    PT Home Exercise Plan Access Code: [email or web address]    Consulted and Agree with Plan of Care Patient           Patient will benefit from skilled therapeutic intervention in order to improve the following deficits and impairments:  Abnormal gait, Decreased coordination, Difficulty walking, Impaired  tone, Impaired UE functional use, Decreased activity tolerance, Decreased balance, Decreased mobility, Decreased strength, Postural dysfunction  Visit Diagnosis: Muscle weakness (generalized)  Unsteadiness on feet  Abnormal posture     Problem List Patient Active Problem List   Diagnosis Date Noted  . Lymph node enlargement   . Cellulitis of groin, right   . Cellulitis   . Sepsis ([place]) 11/20/2019  . Acute inguinal lymphadenitis   . Vascular headache   . Other chronic pain   . Uncontrolled type 2 diabetes mellitus with hyperglycemia ([place])   . Labile blood glucose   . Panic disorder with agoraphobia and moderate panic attacks   . Major  depressive disorder, recurrent episode, moderate ([place])   . MCI (mild cognitive impairment) with memory loss   . Acute ischemic right middle cerebral artery (MCA) stroke ([place]) 03/09/2019  . Left hemiparesis ([place])   . Bradycardia   . Tachypnea   . SIRS (systemic inflammatory response syndrome) (HCC)   . Hypokalemia   . Diabetes mellitus type 2 in obese ([place])   . Tobacco abuse   . History of breast cancer   . Cognitive deficits   . Dysphagia, post-stroke   . Stroke (cerebrum) ([place]) 03/02/2019  . Middle cerebral artery embolism, right 03/02/2019  . Chest pain 12/19/2015  . Numbness 12/19/2015  . Femoral hernia 03/24/2014  . Dyslipidemia 09/10/2011  . BRCA1 positive 09/10/2011  . Breast cancer ([place]) 09/10/2011  . H/O gastric bypass; 05/14/11([facility]) 09/10/2011  . Essential hypertension 10/28/2008  . DEPRESSION/ANXIETY 02/19/2007  . CARPAL TUNNEL SYNDROME, RIGHT 02/19/2007  . ALLERGIC RHINITIS 02/19/2007  . GERD 02/19/2007  . IRRITABLE BOWEL SYNDROME 02/19/2007  . HERPES GENITALIS 12/30/2006  . Diabetes ([place]) 12/30/2006  . OSTEOPENIA 12/30/2006  . ANOMALY, CONGENITAL, NERVOUS SYSTEM NEC 12/30/2006    Ethanjames Fontenot W. 12/07/2019, 3:14 PM  Frazier Butt., PT   [facility] [address] [place], [place], [address] Phone: [phone]   Fax:  [phone]  Name: Ann Lopez MRN: [id number] Date of Birth: 1970-07-16

## 2019-12-09 ENCOUNTER — Ambulatory Visit: Payer: Medicare Other | Admitting: Occupational Therapy

## 2019-12-09 ENCOUNTER — Ambulatory Visit: Payer: Medicare Other | Admitting: Physical Therapy

## 2019-12-10 ENCOUNTER — Encounter: Payer: Medicare Other | Admitting: Occupational Therapy

## 2019-12-10 ENCOUNTER — Ambulatory Visit: Payer: Medicare Other | Admitting: Physical Therapy

## 2019-12-15 ENCOUNTER — Other Ambulatory Visit: Payer: Self-pay

## 2019-12-15 ENCOUNTER — Ambulatory Visit: Payer: Medicare Other | Admitting: Physical Therapy

## 2019-12-15 ENCOUNTER — Ambulatory Visit: Payer: Medicare Other | Attending: Physical Medicine and Rehabilitation | Admitting: Occupational Therapy

## 2019-12-15 ENCOUNTER — Encounter: Payer: Self-pay | Admitting: Occupational Therapy

## 2019-12-15 DIAGNOSIS — M25512 Pain in left shoulder: Secondary | ICD-10-CM | POA: Insufficient documentation

## 2019-12-15 DIAGNOSIS — M6281 Muscle weakness (generalized): Secondary | ICD-10-CM | POA: Diagnosis present

## 2019-12-15 DIAGNOSIS — R2681 Unsteadiness on feet: Secondary | ICD-10-CM | POA: Insufficient documentation

## 2019-12-15 DIAGNOSIS — R278 Other lack of coordination: Secondary | ICD-10-CM | POA: Diagnosis present

## 2019-12-15 DIAGNOSIS — I69354 Hemiplegia and hemiparesis following cerebral infarction affecting left non-dominant side: Secondary | ICD-10-CM

## 2019-12-15 DIAGNOSIS — R2689 Other abnormalities of gait and mobility: Secondary | ICD-10-CM | POA: Insufficient documentation

## 2019-12-15 DIAGNOSIS — R29818 Other symptoms and signs involving the nervous system: Secondary | ICD-10-CM | POA: Insufficient documentation

## 2019-12-15 DIAGNOSIS — R293 Abnormal posture: Secondary | ICD-10-CM | POA: Diagnosis present

## 2019-12-15 DIAGNOSIS — R29898 Other symptoms and signs involving the musculoskeletal system: Secondary | ICD-10-CM | POA: Diagnosis present

## 2019-12-15 NOTE — Therapy (Signed)
[facility] [address] [place], [place], [address] Phone: [phone]   Fax:  [phone]  Physical Therapy Treatment  Patient Details  Name: Ann Lopez MRN: [id number] Date of Birth: Dec 04, 1970 Referring Provider (PT): Dr. Leeroy Cha   Encounter Date: 12/15/2019   PT End of Session - 12/15/19 1340    Visit Number 26    Number of Visits 34    Date for PT Re-Evaluation 38/75/64   per recert 3/32/9518   Authorization Type UHC Medicare/Medicaid    Progress Note Due on Visit 34   Progress note completed at 11/05/2019 visit, #19   PT Start Time 1230    PT Stop Time 1312    PT Time Calculation (min) 42 min    Activity Tolerance Patient tolerated treatment well;Patient limited by fatigue   fatigued at end of session   Behavior During Therapy [facility] for tasks assessed/performed           Past Medical History:  Diagnosis Date  . Allergy   . Anxiety   . Asthma   . Back pain   . Breast cancer ([place])   . Cancer ([place])   . Depression   . Diabetes mellitus   . Headache   . Hyperlipemia   . Osteopenia   . Osteoporosis    osteopenia  . Stroke [facility])     Past Surgical History:  Procedure Laterality Date  . ABDOMINAL HYSTERECTOMY     B oophorectomy for BRCA1 gene  . BREAST SURGERY    . CESAREAN SECTION    . INCISION AND DRAINAGE ABSCESS Left 11/14/2012   Procedure: INCISION AND DRAINAGE ABSCESS LEFT GROIN;  Surgeon: Jamesetta So, MD;  Location: [facility];  Service: General;  Laterality: Left;  . INGUINAL LYMPH NODE BIOPSY Right 11/23/2019   Procedure: INGUINAL LYMPH NODE BIOPSY;  Surgeon: Aviva Signs, MD;  Location: [facility];  Service: General;  Laterality: Right;  . IR ANGIO VERTEBRAL SEL SUBCLAVIAN INNOMINATE UNI R MOD SED  03/02/2019  . IR CT HEAD LTD  03/02/2019  . IR INTRAVSC STENT CERV CAROTID W/O EMB-PROT MOD SED INC ANGIO  03/02/2019  . IR PERCUTANEOUS ART THROMBECTOMY/INFUSION INTRACRANIAL INC DIAG ANGIO   03/02/2019  . LYMPHADENECTOMY    . MASTECTOMY Bilateral   . RADIOLOGY WITH ANESTHESIA N/A 03/02/2019   Procedure: IR WITH ANESTHESIA;  Surgeon: Luanne Bras, MD;  Location: [facility];  Service: Radiology;  Laterality: N/A;    There were no vitals filed for this visit.   Subjective Assessment - 12/15/19 1232    Subjective Had the fever one day last week and felt bad; saw the doctor and she thought it might be the hand/foot/mouth, but I'm okay to return to therapy (no fever for at least 6 days).  Did not wear my foot-up brace today; I think I might be walking better.    Pertinent History ACZ:YSAYTKZS, hyperlipidemia, smoking, breast cancer, R carotid stenosis requiring emergent thrombectomy; 11/23/2019:  surgery to remove infected lymph node R groin    Patient Stated Goals Pt wants all the movement back in L side.    Currently in Pain? No/denies    Pain Onset In the past 7 days                         Discussed continued walking at home, indoors/outdoors several times per day for improved gait endurance.    [facility] Adult PT Treatment/Exercise - 12/15/19 0001  Ambulation/Gait   Ambulation/Gait Yes    Ambulation/Gait Assistance 5: Supervision    Ambulation/Gait Assistance Details Cues for upright posture, abdominal activation with gait to decreased L lateral trunk lean.    Ambulation Distance (Feet) 230 Feet   115 ft x 2; 60 ft x 2   Assistive device None    Gait Pattern Step-through pattern;Decreased stance time - left;Decreased arm swing - left;Trendelenburg;Lateral trunk lean to left    Ambulation Surface Level;Indoor      Neuro Re-ed    Neuro Re-ed Details  Sit to stand on Airex x 10 reps, stagger stance position LLE tucked posteriorly, then sit<>stand x 10 reps with RLE on balance disck to improve LLE weigthbearing.      Knee/Hip Exercises: Aerobic   Nustep BLEs only, Level 3 x 3 minutes, then Level 5 x 2 minutes, attempting to add BUEs, but did not due to pain  in LUE.  Cues throughout for positioning of LLE to avoid LLE external rotation.      Knee/Hip Exercises: Standing   Heel Raises Both;2 sets;10 reps    Heel Raises Limitations Attempted single heel raises, LLE only, 5 reps, then additional 3 reps (stopped due to fatigue)    Forward Step Up Left;1 set;10 reps;Hand Hold: 1;Step Height: 6"    Step Down Left;1 set;10 reps;Hand Hold: 1;Step Height: 6"    Functional Squat 2 sets;10 reps   At counter   Functional Squat Limitations 2nd set up on toes following squat    Wall Squat 2 sets;10 reps    Wall Squat Limitations Pt fatigueing at end of 2nd rep, needs cues for abdominal activation to avoid trunk lean.    Other Standing Knee Exercises LLE as single limb stance, working on knee control:  RLE step taps to 12" step, then RLE posterior into runner's stretch position x 10, RUE support only, cues to not pull through RUE.          Additional NMR: Squat walking at counter, RUE support, 3 reps for hip/knee flexion/ankle dorsiflexion coordination, backwards walking along counter x 3 reps no UE support.       PT Education - 12/15/19 1340    Education Details Additions to HEP-see instructions    Person(s) Educated Patient    Methods Explanation;Demonstration;Handout    Comprehension Verbalized understanding;Returned demonstration            PT Short Term Goals - 12/04/19 1741      PT SHORT TERM GOAL #1   Title Pt will be independent with progression of HEP for improved balance, strength, gait.  TARGET 3 weeks:  12/18/2019    Baseline 10/21/19: pt. verbalizes independence with HEP.    Time 3    Period Weeks    Status Revised      PT SHORT TERM GOAL #2   Title Pt will improve 5x sit<>stand score to less than or equal to 10 seconds for improved functional strength.    Baseline 12/03/2019:  10.12 sec    Time 3    Period Weeks    Status Revised      PT SHORT TERM GOAL #3   Title Pt will ambulate at least 4 ft/sec for improved ability to  follow toddler grandchild with caregiving.    Baseline 3.58 ft/sec    Time 3    Period Weeks    Status New             PT Long Term Goals - 12/04/19 1744  PT LONG TERM GOAL #1   Title Pt will verbalize plans for continued community fitness upon d/c from PT, to maximize gains made in PT.  TARGET 6 weeks, = 01/08/2020    Time 6    Period Weeks    Status New      PT LONG TERM GOAL #2   Title Pt will perform 6 MWT, at least 1250 ft, with no more than 2 point increase in pain for improved posture and efficiency with gait.    Baseline 1200 ft, 4-5/10 pain.    Time 6    Period Weeks    Status New      PT LONG TERM GOAL #3   Title Pt will demo ability to jog 20 ft independently, for improved ability to help care for 2 yo grandchild.    Time 6    Period Weeks    Status New      PT LONG TERM GOAL #4   Title FOTO score to improve by at least 10 % for improved overall reported functional mobility.    Time 6    Period Weeks    Status New      PT LONG TERM GOAL #5   Title Pt will ambulate at least 1000 ft, outdoor and indoor surfaces, appropriate device as needed, modified independently, for improved community gait.    Baseline Not addressed due to heat today; pt does walk 1200 ft in 6 MWT    Time 6    Period Weeks    Status On-going                 Plan - 12/15/19 1343    Clinical Impression Statement Pt only seen one visit last week due to cancelling second visit for not feeling well.  Focused today on NMR and strengthening to LLE.  Pt is not wearing L foot up brace today, and appears to have less recurvatum L knee.  Overall gait pattern with foot clearance is good without foot-up brace; however, by end of session, with fatigue, increased inversion noted in L ankle.  Pt will continue to benefit from further skilled PT to address gait mechanics, strengthening, and NMR for improved overall functional mobility.    Personal Factors and Comorbidities Comorbidity 3+     Comorbidities PMH:  diabetes, hyperlipidemia, smoking, breast cancer, R carotid stenosis requiring emergent thrombectomy, anxiety depression    Examination-Activity Limitations Locomotion Level;Transfers;Reach Overhead;Stand;Stairs;Lift;Dressing    Examination-Participation Restrictions Community Activity;Other   caring for grandchild   Stability/Clinical Decision Making Evolving/Moderate complexity    Rehab Potential Good    PT Frequency Other (comment)   1x/wk (wk of 8/27) and then 2x/wk for 5 weeks   PT Duration 6 weeks   total POC = 6 weeks   PT Treatment/Interventions ADLs/Self Care Home Management;Electrical Stimulation;DME Instruction;Gait training;Stair training;Functional mobility training;Therapeutic activities;Therapeutic exercise;Balance training;Neuromuscular re-education;Manual techniques;Orthotic Fit/Training;Patient/family education;Passive range of motion;Aquatic Therapy    PT Next Visit Plan Review HEP.  Work on squat walking and single LLE heel raises.    LLE and trunk strengthening, balance on compliant surfaces; gait at fast speeds, focus on trunk control with gait    PT Home Exercise Plan Access Code: [email or web address]    Consulted and Agree with Plan of Care Patient           Patient will benefit from skilled therapeutic intervention in order to improve the following deficits and impairments:  Abnormal gait, Decreased coordination, Difficulty walking, Impaired tone, Impaired UE functional use,  Decreased activity tolerance, Decreased balance, Decreased mobility, Decreased strength, Postural dysfunction  Visit Diagnosis: Muscle weakness (generalized)  Unsteadiness on feet  Abnormal posture  Other abnormalities of gait and mobility     Problem List Patient Active Problem List   Diagnosis Date Noted  . Lymph node enlargement   . Cellulitis of groin, right   . Cellulitis   . Sepsis ([place]) 11/20/2019  . Acute inguinal lymphadenitis   . Vascular headache   . Other chronic  pain   . Uncontrolled type 2 diabetes mellitus with hyperglycemia ([place])   . Labile blood glucose   . Panic disorder with agoraphobia and moderate panic attacks   . Major depressive disorder, recurrent episode, moderate ([place])   . MCI (mild cognitive impairment) with memory loss   . Acute ischemic right middle cerebral artery (MCA) stroke ([place]) 03/09/2019  . Left hemiparesis ([place])   . Bradycardia   . Tachypnea   . SIRS (systemic inflammatory response syndrome) (HCC)   . Hypokalemia   . Diabetes mellitus type 2 in obese ([place])   . Tobacco abuse   . History of breast cancer   . Cognitive deficits   . Dysphagia, post-stroke   . Stroke (cerebrum) ([place]) 03/02/2019  . Middle cerebral artery embolism, right 03/02/2019  . Chest pain 12/19/2015  . Numbness 12/19/2015  . Femoral hernia 03/24/2014  . Dyslipidemia 09/10/2011  . BRCA1 positive 09/10/2011  . Breast cancer ([place]) 09/10/2011  . H/O gastric bypass; 05/14/11([facility]) 09/10/2011  . Essential hypertension 10/28/2008  . DEPRESSION/ANXIETY 02/19/2007  . CARPAL TUNNEL SYNDROME, RIGHT 02/19/2007  . ALLERGIC RHINITIS 02/19/2007  . GERD 02/19/2007  . IRRITABLE BOWEL SYNDROME 02/19/2007  . HERPES GENITALIS 12/30/2006  . Diabetes ([place]) 12/30/2006  . OSTEOPENIA 12/30/2006  . ANOMALY, CONGENITAL, NERVOUS SYSTEM NEC 12/30/2006    Kalayla Shadden W. 12/15/2019, 1:55 PM Frazier Butt., PT [facility] [address] [place], [place], [address] Phone: [phone]   Fax:  [phone]  Name: Ann Lopez MRN: [id number] Date of Birth: 05-08-1970

## 2019-12-15 NOTE — Therapy (Signed)
[facility] [address] [place], [place], [address] Phone: [phone]   Fax:  [phone]  Occupational Therapy Treatment  Patient Details  Name: Ann Lopez MRN: [id number] Date of Birth: 1970/05/23 Referring Provider (OT): Dr. Leeroy Cha   Encounter Date: 12/15/2019   OT End of Session - 12/15/19 1409    Visit Number 16    Number of Visits 25    Date for OT Re-Evaluation 12/25/19    Authorization Type UHC Medicare/ Medicaid, covered 100% (medicare guidelines)    Authorization Time Period cert. date 09/25/19-12/24/19.   **Week 8/8    Authorization - Number of Visits 16    Progress Note Due on Visit 20    OT Start Time 1315    OT Stop Time 1358    OT Time Calculation (min) 43 min    Activity Tolerance Patient tolerated treatment well    Behavior During Therapy WFL for tasks assessed/performed           Past Medical History:  Diagnosis Date  . Allergy   . Anxiety   . Asthma   . Back pain   . Breast cancer ([place])   . Cancer ([place])   . Depression   . Diabetes mellitus   . Headache   . Hyperlipemia   . Osteopenia   . Osteoporosis    osteopenia  . Stroke [facility])     Past Surgical History:  Procedure Laterality Date  . ABDOMINAL HYSTERECTOMY     B oophorectomy for BRCA1 gene  . BREAST SURGERY    . CESAREAN SECTION    . INCISION AND DRAINAGE ABSCESS Left 11/14/2012   Procedure: INCISION AND DRAINAGE ABSCESS LEFT GROIN;  Surgeon: Jamesetta So, MD;  Location: [facility];  Service: General;  Laterality: Left;  . INGUINAL LYMPH NODE BIOPSY Right 11/23/2019   Procedure: INGUINAL LYMPH NODE BIOPSY;  Surgeon: Aviva Signs, MD;  Location: [facility];  Service: General;  Laterality: Right;  . IR ANGIO VERTEBRAL SEL SUBCLAVIAN INNOMINATE UNI R MOD SED  03/02/2019  . IR CT HEAD LTD  03/02/2019  . IR INTRAVSC STENT CERV CAROTID W/O EMB-PROT MOD SED INC ANGIO  03/02/2019  . IR PERCUTANEOUS ART THROMBECTOMY/INFUSION  INTRACRANIAL INC DIAG ANGIO  03/02/2019  . LYMPHADENECTOMY    . MASTECTOMY Bilateral   . RADIOLOGY WITH ANESTHESIA N/A 03/02/2019   Procedure: IR WITH ANESTHESIA;  Surgeon: Luanne Bras, MD;  Location: [facility];  Service: Radiology;  Laterality: N/A;    There were no vitals filed for this visit.   Subjective Assessment - 12/15/19 1402    Subjective  My doctor prescribed Robaxin, and she said it should not make me sleepy.    Pertinent History R MCA  stroke with L shoulder pain.  PMH:  diabetes, anxiety, depression hyperlipidemia, smoking, breast cancer s/p bilateral mastectomy followed by chmo in 2009, R carotid stenosis requiring emergent thrombectomy    Limitations fall risk, pain    Patient Stated Goals improve L shoudler pain and ROM, be able to care for grandchild alone    Currently in Pain? No/denies    Pain Score 0-No pain                        OT Treatments/Exercises (OP) - 12/15/19 0001      Neurological Re-education Exercises   Other Exercises 1 Long term goal check - patient progressing toward long term goals.      Other  Exercises 2 Worked on assisted reach pattern on hemi-glide pole.  Patient with very small range of shoulder flexion with elbow extension.  Patient with very limited abduction without pain.        Manual Therapy   Manual Therapy Scapular mobilization    Scapular Mobilization Working to mobilize scapular mobilization with separation of head movement from shoulder motion.  Emphasis on scapular depression, and retraction.  Patient felt this helped her reduce tension in left GH joint.                     OT Short Term Goals - 12/15/19 1412      OT SHORT TERM GOAL #1   Title Pt will be independent with initial HEP.--check STGs 11/13/19 (extended due to scheduling)    Time 4    Period Weeks    Status Achieved      OT SHORT TERM GOAL #2   Title Pt will demo at least 70* L shoulder flexion for functional reaching without pain.     Baseline 40*    Time 4    Period Weeks    Status Achieved   10/29/19:  approx 90*     OT SHORT TERM GOAL #3   Title Pt will verbalize understanding of proper positioning of LUE for decr pain.    Time 4    Period Weeks    Status Achieved      OT SHORT TERM GOAL #4   Title Pt will be able to use LUE in simple light home maintenance tasks as non-dominant assist at least 50% of the time with pain less than 3/10 (wiping table, folding clothes, sweeping).    Time 4    Period Weeks    Status Achieved      OT SHORT TERM GOAL #5   Title Pt will demo improved LUE functional reaching for ADLs as shown by improving score on box and blocks test by at least 6.    Baseline 28 blocks    Time 4    Period Weeks    Status Achieved   36     OT SHORT TERM GOAL #6   Title Pt will verbalize understanding of adaptive strategies/AE to incr independence with cooking tasks.    Time 4    Period Weeks    Status Achieved             OT Long Term Goals - 12/15/19 1412      OT LONG TERM GOAL #1   Title Pt will be independent with updated HEP for LUE--check LTGs 11/24/19    Status On-going      OT LONG TERM GOAL #2   Title Pt will demo at least 90* L shoulder flex without pain for functional reaching.    Baseline 40* initally. 80* 12/15/19 seated.  90* supine 12/15/19    Time 8    Period Weeks    Status On-going      OT LONG TERM GOAL #3   Title Pt will be able to use LUE as nondominant assist for functional tasks at least 75% of the time for light ADLs and IADLs with pain consistently less than or equal to 2/10.    Time 8    Period Weeks    Status On-going      OT LONG TERM GOAL #4   Title Pt will improve L grip strength by at least 8lbs to assist in opening containers/home maintenance tasks.  Baseline 42.7lbs initially, 46.7 12/15/19    Time 8    Period Weeks    Status On-going      OT LONG TERM GOAL #5   Title Pt will improve LUE coordination as shown by improving time on 9-hole peg test by  at least 8sec.    Baseline 40.81sec initally, 38.78 12/15/19    Time 8    Period Weeks    Status On-going      OT LONG TERM GOAL #6   Title Pt will improve LUE functional reaching for ADLs as shown by improving score on box and blocks test by at least 10.    Baseline 28 initially, 39 - 12/15/19    Time 8    Period Weeks    Status Achieved                 Plan - 12/15/19 1410    Clinical Impression Statement Pt is progressing towards goals with improving pain and less spasticity overall.  Patient with improving functional use of LUE.    OT Occupational Profile and History Detailed Assessment- Review of Records and additional review of physical, cognitive, psychosocial history related to current functional performance    Occupational performance deficits (Please refer to evaluation for details): IADL's;ADL's;Leisure    Body Structure / Function / Physical Skills ADL;Balance;IADL;ROM;Strength;Tone;FMC;Coordination;UE functional use;Decreased knowledge of precautions;GMC;Decreased knowledge of use of DME;Pain    Rehab Potential Good    Clinical Decision Making Several treatment options, min-mod task modification necessary    Comorbidities Affecting Occupational Performance: May have comorbidities impacting occupational performance    Modification or Assistance to Complete Evaluation  Min-Moderate modification of tasks or assist with assess necessary to complete eval    OT Frequency 3x / week    OT Duration 8 weeks    OT Treatment/Interventions Self-care/ADL training;Moist Heat;DME and/or AE instruction;Therapeutic activities;Aquatic Therapy;Therapeutic exercise;Ultrasound;Passive range of motion;Functional Mobility Training;Neuromuscular education;Cryotherapy;Electrical Stimulation;Manual Therapy;Patient/family education    Plan ultrasound and manual prn, neuro re-ed, coordination HEP/functional reach with focus on shoulder positioning; begin checking goals next week for anticipated renewal     Consulted and Agree with Plan of Care Patient           Patient will benefit from skilled therapeutic intervention in order to improve the following deficits and impairments:   Body Structure / Function / Physical Skills: ADL, Balance, IADL, ROM, Strength, Tone, FMC, Coordination, UE functional use, Decreased knowledge of precautions, GMC, Decreased knowledge of use of DME, Pain       Visit Diagnosis: Hemiplegia and hemiparesis following cerebral infarction affecting left non-dominant side (HCC)  Abnormal posture  Other symptoms and signs involving the nervous system  Acute pain of left shoulder  Other symptoms and signs involving the musculoskeletal system  Other lack of coordination  Unsteadiness on feet  Muscle weakness (generalized)    Problem List Patient Active Problem List   Diagnosis Date Noted  . Lymph node enlargement   . Cellulitis of groin, right   . Cellulitis   . Sepsis ([place]) 11/20/2019  . Acute inguinal lymphadenitis   . Vascular headache   . Other chronic pain   . Uncontrolled type 2 diabetes mellitus with hyperglycemia ([place])   . Labile blood glucose   . Panic disorder with agoraphobia and moderate panic attacks   . Major depressive disorder, recurrent episode, moderate ([place])   . MCI (mild cognitive impairment) with memory loss   . Acute ischemic right middle cerebral artery (MCA) stroke ([place]) 03/09/2019  .  Left hemiparesis ([place])   . Bradycardia   . Tachypnea   . SIRS (systemic inflammatory response syndrome) (HCC)   . Hypokalemia   . Diabetes mellitus type 2 in obese ([place])   . Tobacco abuse   . History of breast cancer   . Cognitive deficits   . Dysphagia, post-stroke   . Stroke (cerebrum) ([place]) 03/02/2019  . Middle cerebral artery embolism, right 03/02/2019  . Chest pain 12/19/2015  . Numbness 12/19/2015  . Femoral hernia 03/24/2014  . Dyslipidemia 09/10/2011  . BRCA1 positive 09/10/2011  . Breast cancer ([place]) 09/10/2011  . H/O  gastric bypass; 05/14/11([facility]) 09/10/2011  . Essential hypertension 10/28/2008  . DEPRESSION/ANXIETY 02/19/2007  . CARPAL TUNNEL SYNDROME, RIGHT 02/19/2007  . ALLERGIC RHINITIS 02/19/2007  . GERD 02/19/2007  . IRRITABLE BOWEL SYNDROME 02/19/2007  . HERPES GENITALIS 12/30/2006  . Diabetes ([place]) 12/30/2006  . OSTEOPENIA 12/30/2006  . ANOMALY, CONGENITAL, NERVOUS SYSTEM NEC 12/30/2006    Mariah Milling, OTR/L 12/15/2019, 2:14 PM  [facility] [address] [place], [place], [address] Phone: [phone]   Fax:  [phone]  Name: BENEDETTA SUNDSTROM MRN: [id number] Date of Birth: Jan 19, 1971

## 2019-12-15 NOTE — Patient Instructions (Signed)
Access Code: [email or web address] URL: [email or web address] Date: 12/15/2019 Prepared by: Mady Haagensen  Exercises Supine Lower Trunk Rotation - 1 x daily - 7 x weekly - 1 sets - 3 reps - 30 sec hold Sit to stand in stride stance - 1 x daily - 7 x weekly - 1-2 sets - 10 reps Side Stepping with Resistance at Thighs and Counter Support - 1 x daily - 5 x weekly - 1 sets - 3 reps Walking March - 1 x daily - 5 x weekly - 1 sets - 3 reps Tandem Walking with Counter Support - 1 x daily - 5 x weekly - 1 sets - 3 reps Standing Single Leg Stance with Counter Support - 1 x daily - 5 x weekly - 1 sets - 3 reps - 20 hold Step Up - 1 x daily - 5 x weekly - 1 sets - 10 reps  Added 12/15/2019: Wall Quarter Squat - 1 x daily - 5 x weekly - 2-3 sets - 10 reps Heel Toe Raises with Counter Support - 1 x daily - 7 x weekly - 2-3 sets - 10 reps (Removed stride stance rocking forward and back)

## 2019-12-17 ENCOUNTER — Ambulatory Visit: Payer: Medicare Other | Admitting: Physical Therapy

## 2019-12-17 ENCOUNTER — Other Ambulatory Visit: Payer: Self-pay

## 2019-12-17 ENCOUNTER — Ambulatory Visit: Payer: Medicare Other | Admitting: Occupational Therapy

## 2019-12-17 ENCOUNTER — Encounter: Payer: Self-pay | Admitting: Occupational Therapy

## 2019-12-17 DIAGNOSIS — M25512 Pain in left shoulder: Secondary | ICD-10-CM

## 2019-12-17 DIAGNOSIS — R2689 Other abnormalities of gait and mobility: Secondary | ICD-10-CM

## 2019-12-17 DIAGNOSIS — I69354 Hemiplegia and hemiparesis following cerebral infarction affecting left non-dominant side: Secondary | ICD-10-CM | POA: Diagnosis not present

## 2019-12-17 DIAGNOSIS — R293 Abnormal posture: Secondary | ICD-10-CM

## 2019-12-17 DIAGNOSIS — M6281 Muscle weakness (generalized): Secondary | ICD-10-CM

## 2019-12-17 DIAGNOSIS — R2681 Unsteadiness on feet: Secondary | ICD-10-CM

## 2019-12-17 DIAGNOSIS — R278 Other lack of coordination: Secondary | ICD-10-CM

## 2019-12-17 DIAGNOSIS — R29818 Other symptoms and signs involving the nervous system: Secondary | ICD-10-CM

## 2019-12-17 DIAGNOSIS — R29898 Other symptoms and signs involving the musculoskeletal system: Secondary | ICD-10-CM

## 2019-12-17 NOTE — Therapy (Signed)
[facility] [address] [place], [place], [address] Phone: [phone]   Fax:  [phone]  Occupational Therapy Treatment  Patient Details  Name: Ann Lopez MRN: [id number] Date of Birth: [birth year] Referring Provider (OT): Dr. Leeroy Cha   Encounter Date: 12/17/2019   OT End of Session - 12/17/19 1523    Visit Number 17    Number of Visits 25    Date for OT Re-Evaluation 02/15/20    Authorization Type UHC Medicare/ Medicaid, covered 100% (medicare guidelines)    Authorization Time Period cert date 08/13/79-27/5/17    Authorization - Number of Visits 17    Progress Note Due on Visit 20    OT Start Time 1230    OT Stop Time 1315    OT Time Calculation (min) 45 min    Activity Tolerance Patient tolerated treatment well    Behavior During Therapy [facility] for tasks assessed/performed           Past Medical History:  Diagnosis Date  . Allergy   . Anxiety   . Asthma   . Back pain   . Breast cancer ([place])   . Cancer ([place])   . Depression   . Diabetes mellitus   . Headache   . Hyperlipemia   . Osteopenia   . Osteoporosis    osteopenia  . Stroke [facility])     Past Surgical History:  Procedure Laterality Date  . ABDOMINAL HYSTERECTOMY     B oophorectomy for BRCA1 gene  . BREAST SURGERY    . CESAREAN SECTION    . INCISION AND DRAINAGE ABSCESS Left 11/14/2012   Procedure: INCISION AND DRAINAGE ABSCESS LEFT GROIN;  Surgeon: Jamesetta So, MD;  Location: [facility];  Service: General;  Laterality: Left;  . INGUINAL LYMPH NODE BIOPSY Right 11/23/2019   Procedure: INGUINAL LYMPH NODE BIOPSY;  Surgeon: Aviva Signs, MD;  Location: [facility];  Service: General;  Laterality: Right;  . IR ANGIO VERTEBRAL SEL SUBCLAVIAN INNOMINATE UNI R MOD SED  03/02/2019  . IR CT HEAD LTD  03/02/2019  . IR INTRAVSC STENT CERV CAROTID W/O EMB-PROT MOD SED INC ANGIO  03/02/2019  . IR PERCUTANEOUS ART THROMBECTOMY/INFUSION INTRACRANIAL INC  DIAG ANGIO  03/02/2019  . LYMPHADENECTOMY    . MASTECTOMY Bilateral   . RADIOLOGY WITH ANESTHESIA N/A 03/02/2019   Procedure: IR WITH ANESTHESIA;  Surgeon: Luanne Bras, MD;  Location: [facility];  Service: Radiology;  Laterality: N/A;    There were no vitals filed for this visit.   Subjective Assessment - 12/17/19 1238    Subjective  I took the robaxin last night and the night before.  I am nervous to take it during the day.    Pertinent History R MCA  stroke with L shoulder pain.  PMH:  diabetes, anxiety, depression hyperlipidemia, smoking, breast cancer s/p bilateral mastectomy followed by chmo in 2009, R carotid stenosis requiring emergent thrombectomy    Limitations fall risk, pain    Patient Stated Goals improve L shoudler pain and ROM, be able to care for grandchild alone    Currently in Pain? No/denies    Pain Score 0-No pain                        OT Treatments/Exercises (OP) - 12/17/19 0001      Neurological Re-education Exercises   Other Exercises 1 Neuromuscular reeducation to address LUE active assisted to active motion in pain free ranges.  Worked on postural control to isolate arm movement from head and trunk movement.  Patient did well with arm stable and body moving from upright to slumped posture to increase degree of shoulder flexion.  In supine worked on rolling onto left side, then transitioning to active sidelying/sidesitting.  Worked on scapular stailizers in this position.  Transitioned to sitting for guided reach patterns just below 90 degrees of shoulder flexion and with full elbow extension.                      OT Short Term Goals - 12/17/19 1526      OT SHORT TERM GOAL #1   Title Pt will be independent with initial HEP.--check STGs 11/13/19 (extended due to scheduling)    Time 4    Period Weeks    Status Achieved      OT SHORT TERM GOAL #2   Title Pt will demo at least 70* L shoulder flexion for functional reaching without pain.     Baseline 40*    Time 4    Period Weeks    Status Achieved   10/29/19:  approx 90*     OT SHORT TERM GOAL #3   Title Pt will verbalize understanding of proper positioning of LUE for decr pain.    Time 4    Period Weeks    Status Achieved      OT SHORT TERM GOAL #4   Title Pt will be able to use LUE in simple light home maintenance tasks as non-dominant assist at least 50% of the time with pain less than 3/10 (wiping table, folding clothes, sweeping).    Time 4    Period Weeks    Status Achieved      OT SHORT TERM GOAL #5   Title Pt will demo improved LUE functional reaching for ADLs as shown by improving score on box and blocks test by at least 6.    Baseline 28 blocks    Time 4    Period Weeks    Status Achieved   36     OT SHORT TERM GOAL #6   Title Pt will verbalize understanding of adaptive strategies/AE to incr independence with cooking tasks.    Time 4    Period Weeks    Status Achieved             OT Long Term Goals - 12/17/19 1526      OT LONG TERM GOAL #1   Title Pt will be independent with updated HEP for LUE--check LTGs 11/24/19    Status On-going      OT LONG TERM GOAL #2   Title Pt will demo at least 90* L shoulder flex without pain for functional reaching.    Baseline 40* initally. 80* 12/15/19 seated.  90* supine 12/15/19    Time 8    Period Weeks    Status On-going      OT LONG TERM GOAL #3   Title Pt will be able to use LUE as nondominant assist for functional tasks at least 75% of the time for light ADLs and IADLs with pain consistently less than or equal to 2/10.    Time 8    Period Weeks    Status On-going      OT LONG TERM GOAL #4   Title Pt will improve L grip strength by at least 8lbs to assist in opening containers/home maintenance tasks.    Baseline 42.7lbs initially, 46.7 12/15/19  Time 8    Period Weeks    Status On-going      OT LONG TERM GOAL #5   Title Pt will improve LUE coordination as shown by improving time on 9-hole peg test  by at least 8sec.    Baseline 40.81sec initally, 38.78 12/15/19    Time 8    Period Weeks    Status On-going      OT LONG TERM GOAL #6   Title Pt will improve LUE functional reaching for ADLs as shown by improving score on box and blocks test by at least 10.    Baseline 28 initially, 39 - 12/15/19    Time 8    Period Weeks    Status Achieved                 Plan - 12/17/19 1525    Clinical Impression Statement Pt is progressing towards goals with improving pain and less spasticity overall.  Patient with improving functional use of LUE.    Plan ultrasound and manual prn, neuro re-ed, coordination HEP/functional reach with focus on shoulder positioning; begin checking goals next week for anticipated renewal    OT Home Exercise Plan supine bilateral shoulder exercises    Consulted and Agree with Plan of Care Patient           Patient will benefit from skilled therapeutic intervention in order to improve the following deficits and impairments:           Visit Diagnosis: Muscle weakness (generalized) - Plan: Ot plan of care cert/re-cert  Unsteadiness on feet - Plan: Ot plan of care cert/re-cert  Abnormal posture - Plan: Ot plan of care cert/re-cert  Hemiplegia and hemiparesis following cerebral infarction affecting left non-dominant side ([place]) - Plan: Ot plan of care cert/re-cert  Other symptoms and signs involving the nervous system - Plan: Ot plan of care cert/re-cert  Acute pain of left shoulder - Plan: Ot plan of care cert/re-cert  Other symptoms and signs involving the musculoskeletal system - Plan: Ot plan of care cert/re-cert  Other lack of coordination - Plan: Ot plan of care cert/re-cert    Problem List Patient Active Problem List   Diagnosis Date Noted  . Lymph node enlargement   . Cellulitis of groin, right   . Cellulitis   . Sepsis ([place]) 11/20/2019  . Acute inguinal lymphadenitis   . Vascular headache   . Other chronic pain   . Uncontrolled type 2  diabetes mellitus with hyperglycemia ([place])   . Labile blood glucose   . Panic disorder with agoraphobia and moderate panic attacks   . Major depressive disorder, recurrent episode, moderate ([place])   . MCI (mild cognitive impairment) with memory loss   . Acute ischemic right middle cerebral artery (MCA) stroke ([place]) 03/09/2019  . Left hemiparesis ([place])   . Bradycardia   . Tachypnea   . SIRS (systemic inflammatory response syndrome) ([facility])   . Hypokalemia   . Diabetes mellitus type 2 in obese ([place])   . Tobacco abuse   . History of breast cancer   . Cognitive deficits   . Dysphagia, post-stroke   . Stroke (cerebrum) ([place]) 03/02/2019  . Middle cerebral artery embolism, right 03/02/2019  . Chest pain 12/19/2015  . Numbness 12/19/2015  . Femoral hernia 03/24/2014  . Dyslipidemia 09/10/2011  . BRCA1 positive 09/10/2011  . Breast cancer ([place]) 09/10/2011  . H/O gastric bypass; 05/14/11([facility]) 09/10/2011  . Essential hypertension 10/28/2008  . DEPRESSION/ANXIETY 02/19/2007  . CARPAL  TUNNEL SYNDROME, RIGHT 02/19/2007  . ALLERGIC RHINITIS 02/19/2007  . GERD 02/19/2007  . IRRITABLE BOWEL SYNDROME 02/19/2007  . HERPES GENITALIS 12/30/2006  . Diabetes ([place]) 12/30/2006  . OSTEOPENIA 12/30/2006  . ANOMALY, CONGENITAL, NERVOUS SYSTEM NEC 12/30/2006    Mariah Milling, OTR/L 12/17/2019, 3:29 PM  [facility] [address] [place], [place], [address] Phone: [phone]   Fax:  [phone]  Name: Ann Lopez MRN: [id number] Date of Birth: [birth year]

## 2019-12-18 NOTE — Therapy (Signed)
[facility] [address] [place], [place], [address] Phone: [phone]   Fax:  [phone]  Physical Therapy Treatment  Patient Details  Name: Ann Lopez MRN: [id number] Date of Birth: 10/30/1970 Referring Provider (PT): Dr. Leeroy Cha   Encounter Date: 12/17/2019   PT End of Session - 12/18/19 1817    Visit Number 27    Number of Visits 34    Date for PT Re-Evaluation 93/90/30   per recert 0/92/3300   Authorization Type UHC Medicare/Medicaid    Progress Note Due on Visit 34   Progress note completed at 11/05/2019 visit, #19   PT Start Time 7622    PT Stop Time 1358    PT Time Calculation (min) 41 min    Activity Tolerance Patient tolerated treatment well;Patient limited by fatigue;Patient limited by pain   fatigued at end of session; pain in low back   Behavior During Therapy [facility] for tasks assessed/performed           Past Medical History:  Diagnosis Date  . Allergy   . Anxiety   . Asthma   . Back pain   . Breast cancer ([place])   . Cancer ([place])   . Depression   . Diabetes mellitus   . Headache   . Hyperlipemia   . Osteopenia   . Osteoporosis    osteopenia  . Stroke [facility])     Past Surgical History:  Procedure Laterality Date  . ABDOMINAL HYSTERECTOMY     B oophorectomy for BRCA1 gene  . BREAST SURGERY    . CESAREAN SECTION    . INCISION AND DRAINAGE ABSCESS Left 11/14/2012   Procedure: INCISION AND DRAINAGE ABSCESS LEFT GROIN;  Surgeon: Jamesetta So, MD;  Location: [facility];  Service: General;  Laterality: Left;  . INGUINAL LYMPH NODE BIOPSY Right 11/23/2019   Procedure: INGUINAL LYMPH NODE BIOPSY;  Surgeon: Aviva Signs, MD;  Location: [facility];  Service: General;  Laterality: Right;  . IR ANGIO VERTEBRAL SEL SUBCLAVIAN INNOMINATE UNI R MOD SED  03/02/2019  . IR CT HEAD LTD  03/02/2019  . IR INTRAVSC STENT CERV CAROTID W/O EMB-PROT MOD SED INC ANGIO  03/02/2019  . IR PERCUTANEOUS ART  THROMBECTOMY/INFUSION INTRACRANIAL INC DIAG ANGIO  03/02/2019  . LYMPHADENECTOMY    . MASTECTOMY Bilateral   . RADIOLOGY WITH ANESTHESIA N/A 03/02/2019   Procedure: IR WITH ANESTHESIA;  Surgeon: Luanne Bras, MD;  Location: [facility];  Service: Radiology;  Laterality: N/A;    There were no vitals filed for this visit.   Subjective Assessment - 12/17/19 1318    Subjective Started the Robaxin at night, and I'm not sure about how it makes me feel yet.    Pertinent History [id number], hyperlipidemia, smoking, breast cancer, R carotid stenosis requiring emergent thrombectomy; 11/23/2019:  surgery to remove infected lymph node R groin    Patient Stated Goals Pt wants all the movement back in L side.    Currently in Pain? No/denies    Pain Onset In the past 7 days                             [facility] Adult PT Treatment/Exercise - 12/18/19 1807      Transfers   Transfers Sit to Stand;Stand to Sit    Sit to Stand 6: Modified independent (Device/Increase time);Without upper extremity assist;From chair/3-in-1    Stand to Sit 6: Modified independent (Device/Increase time);Without  upper extremity assist;To chair/3-in-1      Ambulation/Gait   Ambulation/Gait Yes    Ambulation/Gait Assistance 5: Supervision    Ambulation/Gait Assistance Details Cues provided for upright posture, abdominal activation; pt continues with L lateral trunk lean    Ambulation Distance (Feet) 50 Feet   x 8 reps; increases back pain to 6/10   Assistive device None    Gait Pattern Step-through pattern;Decreased stance time - left;Decreased arm swing - left;Trendelenburg;Lateral trunk lean to left    Ambulation Surface Level;Indoor    Gait velocity 10.28   8.44 sec at fast   Stairs Yes    Stairs Assistance 5: Supervision    Stair Management Technique One rail Right    Number of Stairs 8    Height of Stairs 6    Gait Comments Attempted gait with increased speed, but pt c/o pain in low back and needed to  take seated breaks.      Neuro Re-ed    Neuro Re-ed Details  Squat walking along counter, 4 reps forward, then backwards walking at counter, to promote improved coordination hip/knee/ankle dorsiflexion      Exercises   Exercises Other Exercises    Other Exercises  During seated breaks, attempted lateral pelvic tilts/lateral trunk stretching and anterior/posterior pelvic tilts to help ease pain in low back, but pt reports no change with exercises, only with rest.      Knee/Hip Exercises: Stretches   Active Hamstring Stretch Right;Left;3 reps;30 seconds    Gastroc Stretch Right;Left;3 reps;20 seconds      Knee/Hip Exercises: Standing   Forward Step Up Left;1 set;10 reps;Hand Hold: 1;Step Height: 6"    Step Down Left;1 set;10 reps;Hand Hold: 1;Step Height: 6"    Functional Squat 2 sets;10 reps    Functional Squat Limitations 2nd set up on toes following squat      Ankle Exercises: Standing   Other Standing Ankle Exercises Single limb heel raises, LLE only, 2 sets x 5 reps             REviewed HEP from last visit: Wall Quarter Squat - 1 x daily - 5 x weekly - 2-3 sets - 10 reps Heel Toe Raises with Counter Support - 1 x daily - 7 x weekly - 2-3 sets - 10 reps       PT Short Term Goals - 12/04/19 1741      PT SHORT TERM GOAL #1   Title Pt will be independent with progression of HEP for improved balance, strength, gait.  TARGET 3 weeks:  12/18/2019    Baseline 10/21/19: pt. verbalizes independence with HEP.    Time 3    Period Weeks    Status Revised      PT SHORT TERM GOAL #2   Title Pt will improve 5x sit<>stand score to less than or equal to 10 seconds for improved functional strength.    Baseline 12/03/2019:  10.12 sec    Time 3    Period Weeks    Status Revised      PT SHORT TERM GOAL #3   Title Pt will ambulate at least 4 ft/sec for improved ability to follow toddler grandchild with caregiving.    Baseline 3.58 ft/sec    Time 3    Period Weeks    Status New              PT Long Term Goals - 12/04/19 1744      PT LONG TERM GOAL #1  Title Pt will verbalize plans for continued community fitness upon d/c from PT, to maximize gains made in PT.  TARGET 6 weeks, = 01/08/2020    Time 6    Period Weeks    Status New      PT LONG TERM GOAL #2   Title Pt will perform 6 MWT, at least 1250 ft, with no more than 2 point increase in pain for improved posture and efficiency with gait.    Baseline 1200 ft, 4-5/10 pain.    Time 6    Period Weeks    Status New      PT LONG TERM GOAL #3   Title Pt will demo ability to jog 20 ft independently, for improved ability to help care for 2 yo grandchild.    Time 6    Period Weeks    Status New      PT LONG TERM GOAL #4   Title FOTO score to improve by at least 10 % for improved overall reported functional mobility.    Time 6    Period Weeks    Status New      PT LONG TERM GOAL #5   Title Pt will ambulate at least 1000 ft, outdoor and indoor surfaces, appropriate device as needed, modified independently, for improved community gait.    Baseline Not addressed due to heat today; pt does walk 1200 ft in 6 MWT    Time 6    Period Weeks    Status On-going                 Plan - 12/18/19 1819    Clinical Impression Statement Pt limited today with gait activities, with increase in low back pain.  Noted continued decreased coordination through trunk LUE and LLE with gait, which is likely contributing, despite work on strength and cues for abdominal activation for more upright posture.    Personal Factors and Comorbidities Comorbidity 3+    Comorbidities PMH:  diabetes, hyperlipidemia, smoking, breast cancer, R carotid stenosis requiring emergent thrombectomy, anxiety depression    Examination-Activity Limitations Locomotion Level;Transfers;Reach Overhead;Stand;Stairs;Lift;Dressing    Examination-Participation Restrictions Community Activity;Other   caring for grandchild   Stability/Clinical Decision  Making Evolving/Moderate complexity    Rehab Potential Good    PT Frequency Other (comment)   1x/wk (wk of 8/27) and then 2x/wk for 5 weeks   PT Duration 6 weeks   total POC = 6 weeks   PT Treatment/Interventions ADLs/Self Care Home Management;Electrical Stimulation;DME Instruction;Gait training;Stair training;Functional mobility training;Therapeutic activities;Therapeutic exercise;Balance training;Neuromuscular re-education;Manual techniques;Orthotic Fit/Training;Patient/family education;Passive range of motion;Aquatic Therapy    PT Next Visit Plan Need to check 3-wk STGs (ask OT about trunk strengthening)Work on squat walking and single LLE heel raises.    LLE and trunk strengthening, balance on compliant surfaces; gait at fast speeds, focus on trunk control with gait    PT Home Exercise Plan Access Code: [email or web address]    Consulted and Agree with Plan of Care Patient           Patient will benefit from skilled therapeutic intervention in order to improve the following deficits and impairments:  Abnormal gait, Decreased coordination, Difficulty walking, Impaired tone, Impaired UE functional use, Decreased activity tolerance, Decreased balance, Decreased mobility, Decreased strength, Postural dysfunction  Visit Diagnosis: Muscle weakness (generalized)  Unsteadiness on feet  Abnormal posture  Other abnormalities of gait and mobility     Problem List Patient Active Problem List   Diagnosis Date Noted  . Lymph node  enlargement   . Cellulitis of groin, right   . Cellulitis   . Sepsis ([place]) 11/20/2019  . Acute inguinal lymphadenitis   . Vascular headache   . Other chronic pain   . Uncontrolled type 2 diabetes mellitus with hyperglycemia ([place])   . Labile blood glucose   . Panic disorder with agoraphobia and moderate panic attacks   . Major depressive disorder, recurrent episode, moderate ([place])   . MCI (mild cognitive impairment) with memory loss   . Acute ischemic right middle cerebral  artery (MCA) stroke ([place]) 03/09/2019  . Left hemiparesis ([place])   . Bradycardia   . Tachypnea   . SIRS (systemic inflammatory response syndrome) ([facility])   . Hypokalemia   . Diabetes mellitus type 2 in obese ([place])   . Tobacco abuse   . History of breast cancer   . Cognitive deficits   . Dysphagia, post-stroke   . Stroke (cerebrum) ([place]) 03/02/2019  . Middle cerebral artery embolism, right 03/02/2019  . Chest pain 12/19/2015  . Numbness 12/19/2015  . Femoral hernia 03/24/2014  . Dyslipidemia 09/10/2011  . BRCA1 positive 09/10/2011  . Breast cancer ([place]) 09/10/2011  . H/O gastric bypass; 05/14/11([facility]) 09/10/2011  . Essential hypertension 10/28/2008  . DEPRESSION/ANXIETY 02/19/2007  . CARPAL TUNNEL SYNDROME, RIGHT 02/19/2007  . ALLERGIC RHINITIS 02/19/2007  . GERD 02/19/2007  . IRRITABLE BOWEL SYNDROME 02/19/2007  . HERPES GENITALIS 12/30/2006  . Diabetes ([place]) 12/30/2006  . OSTEOPENIA 12/30/2006  . ANOMALY, CONGENITAL, NERVOUS SYSTEM NEC 12/30/2006    Emmilyn Crooke W. 12/18/2019, 6:23 PM  [facility]raz[facility] Butt., PT   [facility] [address] [place], [place], [address] Phone: [phone]   Fax:  [phone]  Name: ZAHARA REMBERT MRN: [id number] Date of Birth: [birth year]

## 2019-12-19 ENCOUNTER — Other Ambulatory Visit: Payer: Self-pay | Admitting: Physical Medicine and Rehabilitation

## 2019-12-21 ENCOUNTER — Ambulatory Visit: Payer: Medicare Other | Admitting: Physical Therapy

## 2019-12-21 ENCOUNTER — Other Ambulatory Visit: Payer: Self-pay

## 2019-12-21 DIAGNOSIS — M6281 Muscle weakness (generalized): Secondary | ICD-10-CM

## 2019-12-21 DIAGNOSIS — R2689 Other abnormalities of gait and mobility: Secondary | ICD-10-CM

## 2019-12-21 DIAGNOSIS — R293 Abnormal posture: Secondary | ICD-10-CM

## 2019-12-21 DIAGNOSIS — I69354 Hemiplegia and hemiparesis following cerebral infarction affecting left non-dominant side: Secondary | ICD-10-CM | POA: Diagnosis not present

## 2019-12-22 NOTE — Therapy (Signed)
[facility] [address] [place], [place], [address] Phone: [phone]   Fax:  [phone]  Physical Therapy Treatment  Patient Details  Name: Ann Lopez MRN: [id number] Date of Birth: 08/08/1970 Referring Provider (PT): Dr. Leeroy Cha   Encounter Date: 12/21/2019   PT End of Session - 12/22/19 2042    Visit Number 28    Number of Visits 34    Date for PT Re-Evaluation 81/01/75   per recert 04/10/5850   Authorization Type [employer] Medicare/Medicaid    Progress Note Due on Visit 34   Progress note completed at 11/05/2019 visit, #19   PT Start Time 1402    PT Stop Time 1445    PT Time Calculation (min) 43 min    Activity Tolerance Patient tolerated treatment well;Patient limited by pain   pain in low back 3-4/10 at end of session   Behavior During Therapy [facility] for tasks assessed/performed           Past Medical History:  Diagnosis Date  . Allergy   . Anxiety   . Asthma   . Back pain   . Breast cancer ([place])   . Cancer ([place])   . Depression   . Diabetes mellitus   . Headache   . Hyperlipemia   . Osteopenia   . Osteoporosis    osteopenia  . Stroke [facility])     Past Surgical History:  Procedure Laterality Date  . ABDOMINAL HYSTERECTOMY     B oophorectomy for BRCA1 gene  . BREAST SURGERY    . CESAREAN SECTION    . INCISION AND DRAINAGE ABSCESS Left 11/14/2012   Procedure: INCISION AND DRAINAGE ABSCESS LEFT GROIN;  Surgeon: Jamesetta So, MD;  Location: [facility];  Service: General;  Laterality: Left;  . INGUINAL LYMPH NODE BIOPSY Right 11/23/2019   Procedure: INGUINAL LYMPH NODE BIOPSY;  Surgeon: Aviva Signs, MD;  Location: [facility];  Service: General;  Laterality: Right;  . IR ANGIO VERTEBRAL SEL SUBCLAVIAN INNOMINATE UNI R MOD SED  03/02/2019  . IR CT HEAD LTD  03/02/2019  . IR INTRAVSC STENT CERV CAROTID W/O EMB-PROT MOD SED INC ANGIO  03/02/2019  . IR PERCUTANEOUS ART THROMBECTOMY/INFUSION INTRACRANIAL  INC DIAG ANGIO  03/02/2019  . LYMPHADENECTOMY    . MASTECTOMY Bilateral   . RADIOLOGY WITH ANESTHESIA N/A 03/02/2019   Procedure: IR WITH ANESTHESIA;  Surgeon: Luanne Bras, MD;  Location: [facility];  Service: Radiology;  Laterality: N/A;    There were no vitals filed for this visit.   Subjective Assessment - 12/22/19 2023    Subjective Used a heating pad on my back; took all weekend basically for it to get better.    Pertinent History DPO:EUMPNTIR, hyperlipidemia, smoking, breast cancer, R carotid stenosis requiring emergent thrombectomy; 11/23/2019:  surgery to remove infected lymph node R groin    Patient Stated Goals Pt wants all the movement back in L side.    Currently in Pain? No/denies    Pain Onset In the past 7 days                             [facility] Adult PT Treatment/Exercise - 12/22/19 2023      Ambulation/Gait   Ambulation/Gait Yes    Ambulation/Gait Assistance 5: Supervision    Ambulation/Gait Assistance Details Cues provided for abdominal activation, upright posture to lessen trunk lean    Ambulation Distance (Feet) 300 Feet  outdoors; 50 ft x 2 indoors   Assistive device None    Gait Pattern Step-through pattern;Decreased stance time - left;Decreased arm swing - left;Trendelenburg;Lateral trunk lean to left    Ambulation Surface Level;Indoor;Outdoor;Unlevel    Gait velocity 3.95 ft/sec    Gait Comments Outdoors including hill incline/decline with supervision and cues for posture, foot clearance      Neuro Re-ed    Neuro Re-ed Details  Standing on incline, feet apart head turns x 5 reps, then alternating step taps x 10 reps with min guard assistance.  Partial tandem stance with LLE posterior, with head turns/head nods x 5 reps each with min guard assist.  PT provides tactile cues at left trunk and verbal cues for abodminal activation.  Single limb stance with LLE as SLS and RLE propped at curb step, visual cues of mirror with head turns/head nods x 5  reps for improved trunk control and posture.      Knee/Hip Exercises: Standing   Lateral Step Up Left;1 set;10 reps    Lateral Step Up Limitations visual cues of mirror, tactile cues for upright trunk thorugh L side    Forward Step Up Left;1 set;10 reps   At curb step, mirror in front-visual/tactile cues     Knee/Hip Exercises: Supine   Bridges Strengthening;Both;1 set;10 reps    Bridges with Clamshell Strengthening;Both;5 reps;2 sets    Single Leg Bridge Strengthening;Left;5 reps;2 sets    Other Supine Knee/Hip Exercises hooklying march iwth abdominal activation, 3 reps, x 3 sets; then abdominal activation with hooklying hip abduction 5 reps x 2 sets                    PT Short Term Goals - 12/22/19 2041      PT SHORT TERM GOAL #1   Title Pt will be independent with progression of HEP for improved balance, strength, gait.  TARGET 3 weeks:  12/18/2019    Baseline 10/21/19: pt. verbalizes independence with HEP.    Time 3    Period Weeks    Status Achieved      PT SHORT TERM GOAL #2   Title Pt will improve 5x sit<>stand score to less than or equal to 10 seconds for improved functional strength.    Baseline 12/03/2019:  10.12 sec; 12/21/2019:  10.15 sec; 8.69 sec at best    Time 3    Period Weeks    Status Achieved      PT SHORT TERM GOAL #3   Title Pt will ambulate at least 4 ft/sec for improved ability to follow toddler grandchild with caregiving.    Baseline 3.58 ft/sec; 9/13:  9.43 sec = 3.47 ft/sec; fast: 8.31 sec = 3.95 ft/sec 12/21/2019    Time 3    Period Weeks    Status Partially Met             PT Long Term Goals - 12/04/19 1744      PT LONG TERM GOAL #1   Title Pt will verbalize plans for continued community fitness upon d/c from PT, to maximize gains made in PT.  TARGET 6 weeks, = 01/08/2020    Time 6    Period Weeks    Status New      PT LONG TERM GOAL #2   Title Pt will perform 6 MWT, at least 1250 ft, with no more than 2 point increase in pain for  improved posture and efficiency with gait.    Baseline 1200 ft, 4-5/10  pain.    Time 6    Period Weeks    Status New      PT LONG TERM GOAL #3   Title Pt will demo ability to jog 20 ft independently, for improved ability to help care for 2 yo grandchild.    Time 6    Period Weeks    Status New      PT LONG TERM GOAL #4   Title FOTO score to improve by at least 10 % for improved overall reported functional mobility.    Time 6    Period Weeks    Status New      PT LONG TERM GOAL #5   Title Pt will ambulate at least 1000 ft, outdoor and indoor surfaces, appropriate device as needed, modified independently, for improved community gait.    Baseline Not addressed due to heat today; pt does walk 1200 ft in 6 MWT    Time 6    Period Weeks    Status On-going                 Plan - 12/22/19 2043    Clinical Impression Statement Assessed STGs, with pt meeting STG 1 and 2.  STG 3 partially met for gait vleocity, with improvement noted, but not to goal level.  Focus of today's session was core stability, trunk activation and postural awareness with standing and with gait activities, to try to offset L lateral lean and pain in low back (as it was limiting treatment session last visit).  Pt does better with trunk control when she does not hold onto anything today in PT session (ie-SLS at curb with min guard or on incline).  She will continue to benefit from skilled PT to address balance, strength, NMR to LLE.    Personal Factors and Comorbidities Comorbidity 3+    Comorbidities PMH:  diabetes, hyperlipidemia, smoking, breast cancer, R carotid stenosis requiring emergent thrombectomy, anxiety depression    Examination-Activity Limitations Locomotion Level;Transfers;Reach Overhead;Stand;Stairs;Lift;Dressing    Examination-Participation Restrictions Community Activity;Other   caring for grandchild   Stability/Clinical Decision Making Evolving/Moderate complexity    Rehab Potential Good    PT  Frequency Other (comment)   1x/wk (wk of 8/27) and then 2x/wk for 5 weeks   PT Duration 6 weeks   total POC = 6 weeks   PT Treatment/Interventions ADLs/Self Care Home Management;Electrical Stimulation;DME Instruction;Gait training;Stair training;Functional mobility training;Therapeutic activities;Therapeutic exercise;Balance training;Neuromuscular re-education;Manual techniques;Orthotic Fit/Training;Patient/family education;Passive range of motion;Aquatic Therapy    PT Next Visit Plan Work on trunk strengthening; work on SLS and reliance on LLE with partial tandem on incline/decline and curb step (no UE support to avoid trunk lean); balance on complianat surfaces, gait training and trunk controlw tih gait    PT Home Exercise Plan Access Code: [email or web address]    Consulted and Agree with Plan of Care Patient           Patient will benefit from skilled therapeutic intervention in order to improve the following deficits and impairments:  Abnormal gait, Decreased coordination, Difficulty walking, Impaired tone, Impaired UE functional use, Decreased activity tolerance, Decreased balance, Decreased mobility, Decreased strength, Postural dysfunction  Visit Diagnosis: Abnormal posture  Muscle weakness (generalized)  Other abnormalities of gait and mobility     Problem List Patient Active Problem List   Diagnosis Date Noted  . Lymph node enlargement   . Cellulitis of groin, right   . Cellulitis   . Sepsis ([place]) 11/20/2019  . Acute inguinal lymphadenitis   .  Vascular headache   . Other chronic pain   . Uncontrolled type 2 diabetes mellitus with hyperglycemia ([place])   . Labile blood glucose   . Panic disorder with agoraphobia and moderate panic attacks   . Major depressive disorder, recurrent episode, moderate ([place])   . MCI (mild cognitive impairment) with memory loss   . Acute ischemic right middle cerebral artery (MCA) stroke ([place]) 03/09/2019  . Left hemiparesis ([place])   . Bradycardia   .  Tachypnea   . SIRS (systemic inflammatory response syndrome) ([facility])   . Hypokalemia   . Diabetes mellitus type 2 in obese ([place])   . Tobacco abuse   . History of breast cancer   . Cognitive deficits   . Dysphagia, post-stroke   . Stroke (cerebrum) ([place]) 03/02/2019  . Middle cerebral artery embolism, right 03/02/2019  . Chest pain 12/19/2015  . Numbness 12/19/2015  . Femoral hernia 03/24/2014  . Dyslipidemia 09/10/2011  . BRCA1 positive 09/10/2011  . Breast cancer ([place]) 09/10/2011  . H/O gastric bypass; 05/14/11([facility]) 09/10/2011  . Essential hypertension 10/28/2008  . DEPRESSION/ANXIETY 02/19/2007  . CARPAL TUNNEL SYNDROME, RIGHT 02/19/2007  . ALLERGIC RHINITIS 02/19/2007  . GERD 02/19/2007  . IRRITABLE BOWEL SYNDROME 02/19/2007  . HERPES GENITALIS 12/30/2006  . Diabetes ([place]) 12/30/2006  . OSTEOPENIA 12/30/2006  . ANOMALY, CONGENITAL, NERVOUS SYSTEM NEC 12/30/2006    Truman Aceituno W. 12/22/2019, 8:47 PM  Frazier Butt., PT   [facility] [address] [place], [place], [address] Phone: [phone]   Fax:  [phone]  Name: Ann Lopez MRN: [id number] Date of Birth: 1970-07-16

## 2019-12-23 ENCOUNTER — Other Ambulatory Visit: Payer: Self-pay

## 2019-12-23 ENCOUNTER — Encounter: Payer: Self-pay | Admitting: Physical Medicine and Rehabilitation

## 2019-12-23 ENCOUNTER — Ambulatory Visit: Payer: Medicare Other | Admitting: Physical Therapy

## 2019-12-23 ENCOUNTER — Encounter
Payer: Medicare Other | Attending: Physical Medicine and Rehabilitation | Admitting: Physical Medicine and Rehabilitation

## 2019-12-23 VITALS — BP 101/67 | HR 84 | Temp 98.2°F | Ht 62.75 in | Wt 172.0 lb

## 2019-12-23 DIAGNOSIS — M7502 Adhesive capsulitis of left shoulder: Secondary | ICD-10-CM | POA: Insufficient documentation

## 2019-12-23 DIAGNOSIS — I63511 Cerebral infarction due to unspecified occlusion or stenosis of right middle cerebral artery: Secondary | ICD-10-CM | POA: Diagnosis present

## 2019-12-23 MED ORDER — LIDOCAINE 4 % EX PTCH: 1.0000 | MEDICATED_PATCH | Freq: Every day | CUTANEOUS | 0 refills | Status: DC | PRN

## 2019-12-23 MED ORDER — MELOXICAM 15 MG PO TABS: 15.0000 mg | ORAL_TABLET | Freq: Every day | ORAL | 0 refills | Status: DC

## 2019-12-23 MED ORDER — DICLOFENAC SODIUM 1 % EX GEL: 2.0000 g | Freq: Four times a day (QID) | CUTANEOUS | 3 refills | Status: DC

## 2019-12-23 NOTE — Patient Instructions (Addendum)
[email or web address].[email or web address]   1) Ginger 2) Blueberries 3) Salmon 4) Pumpkin seeds 5) dark chocolate 6) turmeric 7) tart cherries 8) virgin olive oil 9) chilli peppers 10) mint  11) red wine  [employer] app for yoga- free

## 2019-12-23 NOTE — Progress Notes (Signed)
Subjective:    Patient ID: Ann Lopez, female    DOB: 1970/08/30, 49 y.o.   MRN: [id number]  HPI  Ann Lopez is a 49 year old woman who presents for follow-up of shoulder pain.  She has had improved strength in her left shoulder. She loves her therapy. She wants the full movement back in her arm back. Her strength is much improved since she participated in the [employer] trial to improve her mobility. She had injection and nerve block 2 months ago with excellent relief as well.   Insomnia: Improved.   She is going to a Web designer. She has been vaccinated.   She has been able to walk more. She tries to walk as much as she can. She walks 45 minutes per day. She is trying to get rid of her limp.    Pain Inventory Average Pain 5 Pain Right Now 3 My pain is sharp and dull  In the last 24 hours, has pain interfered with the following? General activity 5 Relation with others 5 Enjoyment of life 6 What TIME of day is your pain at its worst? daytime Sleep (in general) Fair  Pain is worse with: some activites Pain improves with: rest and medication Relief from Meds: 8  Family History  Problem Relation Age of Onset  . Cancer Mother 74       breast cancer  . Hypertension Sister   . Hypothyroidism Sister   . Cancer Sister   . Hypothyroidism Brother   . Cancer Maternal Grandmother 48       breast cancer  . Cancer Maternal Grandfather 26       pancreatic cancer   Social History   Socioeconomic History  . Marital status: Single    Spouse name: Not on file  . Number of children: 4  . Years of education: 68  . Highest education level: Not on file  Occupational History  . Occupation: Disabled  Tobacco Use  . Smoking status: Former Smoker    Packs/day: 0.50    Types: Cigarettes    Quit date: 03/02/2019    Years since quitting: 0.8  . Smokeless tobacco: Never Used  Substance and Sexual Activity  . Alcohol use: No    Alcohol/week: 0.0 standard drinks   . Drug use: No  . Sexual activity: Not Currently  Other Topics Concern  . Not on file  Social History Narrative   Marital status: single      Lives: at home with her four sons (12, 32, 48, 24); no grandchildren      Employment: disability for breast cancer, DDD lumbar, anxiety/panic attacks      Tobacco: 1 ppd       Alcohol: none         Right-handed.   2-3 cups caffeine daily.   Social Determinants of Health   Financial Resource Strain:   . Difficulty of Paying Living Expenses: Not on file  Food Insecurity:   . Worried About Charity fundraiser in the Last Year: Not on file  . Ran Out of Food in the Last Year: Not on file  Transportation Needs:   . Lack of Transportation (Medical): Not on file  . Lack of Transportation (Non-Medical): Not on file  Physical Activity:   . Days of Exercise per Week: Not on file  . Minutes of Exercise per Session: Not on file  Stress:   . Feeling of Stress : Not on file  Social  Connections:   . Frequency of Communication with Friends and Family: Not on file  . Frequency of Social Gatherings with Friends and Family: Not on file  . Attends Religious Services: Not on file  . Active Member of Clubs or Organizations: Not on file  . Attends Archivist Meetings: Not on file  . Marital Status: Not on file   Past Surgical History:  Procedure Laterality Date  . ABDOMINAL HYSTERECTOMY     B oophorectomy for BRCA1 gene  . BREAST SURGERY    . CESAREAN SECTION    . INCISION AND DRAINAGE ABSCESS Left 11/14/2012   Procedure: INCISION AND DRAINAGE ABSCESS LEFT GROIN;  Surgeon: Jamesetta So, MD;  Location: [facility];  Service: General;  Laterality: Left;  . INGUINAL LYMPH NODE BIOPSY Right 11/23/2019   Procedure: INGUINAL LYMPH NODE BIOPSY;  Surgeon: Aviva Signs, MD;  Location: [facility];  Service: General;  Laterality: Right;  . IR ANGIO VERTEBRAL SEL SUBCLAVIAN INNOMINATE UNI R MOD SED  03/02/2019  . IR CT HEAD LTD  03/02/2019  . IR INTRAVSC STENT  CERV CAROTID W/O EMB-PROT MOD SED INC ANGIO  03/02/2019  . IR PERCUTANEOUS ART THROMBECTOMY/INFUSION INTRACRANIAL INC DIAG ANGIO  03/02/2019  . LYMPHADENECTOMY    . MASTECTOMY Bilateral   . RADIOLOGY WITH ANESTHESIA N/A 03/02/2019   Procedure: IR WITH ANESTHESIA;  Surgeon: Luanne Bras, MD;  Location: [facility];  Service: Radiology;  Laterality: N/A;   Past Surgical History:  Procedure Laterality Date  . ABDOMINAL HYSTERECTOMY     B oophorectomy for BRCA1 gene  . BREAST SURGERY    . CESAREAN SECTION    . INCISION AND DRAINAGE ABSCESS Left 11/14/2012   Procedure: INCISION AND DRAINAGE ABSCESS LEFT GROIN;  Surgeon: Jamesetta So, MD;  [facility];  Service: General;  Laterality: Left;  . INGUINAL LYMPH NODE BIOPSY Right 11/23/2019   Procedure: INGUINAL LYMPH NODE BIOPSY;  Surgeon: Aviva Signs, MD;  [facility];  Service: General;  Laterality: Right;  . IR ANGIO VERTEBRAL SEL SUBCLAVIAN INNOMINATE UNI R MOD SED  03/02/2019  . IR CT HEAD LTD  03/02/2019  . IR INTRAVSC STENT CERV CAROTID W/O EMB-PROT MOD SED INC ANGIO  03/02/2019  . IR PERCUTANEOUS ART THROMBECTOMY/INFUSION INTRACRANIAL INC DIAG ANGIO  03/02/2019  . LYMPHADENECTOMY    . MASTECTOMY Bilateral   . RADIOLOGY WITH ANESTHESIA N/A 03/02/2019   Procedure: IR WITH ANESTHESIA;  Surgeon: Luanne Bras, MD;  Location: [facility];  Service: Radiology;  Laterality: N/A;   Past Medical History:  Diagnosis Date  . Allergy   . Anxiety   . Asthma   . Back pain   . Breast cancer ([place])   . Cancer ([place])   . Depression   . Diabetes mellitus   . Headache   . Hyperlipemia   . Osteopenia   . Osteoporosis    osteopenia  . Stroke ([place])    BP 101/67   Pulse 84   Temp 98.2 F (36.8 C)   Ht 5' 2.75" (1.594 m)   Wt 172 lb (78 kg)   SpO2 98%   BMI 30.71 kg/m   Opioid Risk Score:   Fall Risk Score:  `1  Depression screen PHQ 2/9  Depression screen [facility] 2/9 01/04/2016 12/06/2015 10/27/2015 09/29/2015 08/30/2015 07/28/2015  06/28/2015  Decreased Interest 0 0 0 0 0 0 0  Down, Depressed, Hopeless 0 0 1 0 0 0 0  PHQ - 2 Score 0 0 1 0 0  0 0  Altered sleeping - - - - - - -  Tired, decreased energy - - - - - - -  Change in appetite - - - - - - -  Feeling bad or failure about yourself  - - - - - - -  Trouble concentrating - - - - - - -  Moving slowly or fidgety/restless - - - - - - -  Suicidal thoughts - - - - - - -  PHQ-9 Score - - - - - - -  Difficult doing work/chores - - - - - - -  Some recent data might be hidden    Review of Systems  Constitutional: Negative.   HENT: Negative.   Eyes: Negative.   Respiratory: Negative.   Cardiovascular: Negative.   Gastrointestinal: Negative.   Endocrine: Negative.   Genitourinary: Negative.   Musculoskeletal: Negative.   Skin: Negative.   Allergic/Immunologic: Negative.   Neurological: Negative.   Hematological: Negative.   Psychiatric/Behavioral: Negative.   All other systems reviewed and are negative.      Objective:   Physical Exam Gen: no distress, normal appearing HEENT: oral mucosa pink and moist, NCAT Cardio: Reg rate Chest: normal effort, normal rate of breathing Abd: soft, non-distended Ext: no edema Skin: intact Neuro: Alert and oriented x3. Musculoskeletal: left shoulder with 4/5 SAB and severely limited external rotation. Otherwise with 5/5 strength throughout which is great improvement from last visit! Tight cervical musculature- no significant trigger points.  Psych: pleasant, normal affect, much more positive than last visit!     Assessment & Plan:  Ann. Campo is a 49 year old woman who presents with shoulder pain post-stroke.   1) Adhesive capsulitis of left shoulder: -He strength is much improved since last visit and she does not exhibit significant LUE spasticity. I think her pain is most likely due to frozen shoulder, especially given her severely limited external rotation.  -Continue current physical therapy.  -Can repeat steroid  injection and/or nerve block in 1 month. -Discussed [employer] PNS system as an option of pain treatment via neuromodulation. Provided following link for patient to learn more about the system: https://www.sprtherapeutics.com/.  Will target left suprascapular nerve and schedule in 4 weeks. Patient has failed more than 3 months of conservative treatment as well as steroid injection. -Prescribed meloxicam, lidocaine patch, and diclofenac gel for pain relief in interim.   2) Impaired mobility and ADLs -Continue daily 45 minute walk -Advised to lift weights to maintain muscle mass and good metabolism.

## 2019-12-25 ENCOUNTER — Ambulatory Visit: Payer: Medicare Other

## 2019-12-25 ENCOUNTER — Other Ambulatory Visit: Payer: Self-pay

## 2019-12-25 DIAGNOSIS — R293 Abnormal posture: Secondary | ICD-10-CM

## 2019-12-25 DIAGNOSIS — R2689 Other abnormalities of gait and mobility: Secondary | ICD-10-CM

## 2019-12-25 DIAGNOSIS — R2681 Unsteadiness on feet: Secondary | ICD-10-CM

## 2019-12-25 DIAGNOSIS — M6281 Muscle weakness (generalized): Secondary | ICD-10-CM

## 2019-12-25 DIAGNOSIS — I69354 Hemiplegia and hemiparesis following cerebral infarction affecting left non-dominant side: Secondary | ICD-10-CM | POA: Diagnosis not present

## 2019-12-25 NOTE — Therapy (Signed)
[facility] [address] [place], [place], [address] Phone: [phone]   Fax:  [phone]  Physical Therapy Treatment  Patient Details  Name: Ann Lopez MRN: [id number] Date of Birth: 1970-12-26 Referring Provider (PT): Dr. Leeroy Cha   Encounter Date: 12/25/2019   PT End of Session - 12/25/19 1019    Visit Number 29    Number of Visits 34    Date for PT Re-Evaluation 71/24/58   per recert 0/99/8338   Authorization Type UHC Medicare/Medicaid    Progress Note Due on Visit 34   Progress note completed at 11/05/2019 visit, #19   PT Start Time 1016    PT Stop Time 1058    PT Time Calculation (min) 42 min    Activity Tolerance Patient tolerated treatment well;Patient limited by pain   pain in low back 3-4/10 at end of session   Behavior During Therapy [facility] for tasks assessed/performed           Past Medical History:  Diagnosis Date  . Allergy   . Anxiety   . Asthma   . Back pain   . Breast cancer ([place])   . Cancer ([place])   . Depression   . Diabetes mellitus   . Headache   . Hyperlipemia   . Osteopenia   . Osteoporosis    osteopenia  . Stroke [facility])     Past Surgical History:  Procedure Laterality Date  . ABDOMINAL HYSTERECTOMY     B oophorectomy for BRCA1 gene  . BREAST SURGERY    . CESAREAN SECTION    . INCISION AND DRAINAGE ABSCESS Left 11/14/2012   Procedure: INCISION AND DRAINAGE ABSCESS LEFT GROIN;  Surgeon: Jamesetta So, MD;  Location: [facility];  Service: General;  Laterality: Left;  . INGUINAL LYMPH NODE BIOPSY Right 11/23/2019   Procedure: INGUINAL LYMPH NODE BIOPSY;  Surgeon: Aviva Signs, MD;  Location: [facility];  Service: General;  Laterality: Right;  . IR ANGIO VERTEBRAL SEL SUBCLAVIAN INNOMINATE UNI R MOD SED  03/02/2019  . IR CT HEAD LTD  03/02/2019  . IR INTRAVSC STENT CERV CAROTID W/O EMB-PROT MOD SED INC ANGIO  03/02/2019  . IR PERCUTANEOUS ART THROMBECTOMY/INFUSION INTRACRANIAL  INC DIAG ANGIO  03/02/2019  . LYMPHADENECTOMY    . MASTECTOMY Bilateral   . RADIOLOGY WITH ANESTHESIA N/A 03/02/2019   Procedure: IR WITH ANESTHESIA;  Surgeon: Luanne Bras, MD;  Location: [facility];  Service: Radiology;  Laterality: N/A;    There were no vitals filed for this visit.   Subjective Assessment - 12/25/19 1020    Subjective Patient reports that back feels good this morning. No other changes/complaints. Patient reports that appointment with Dr. Ranell Patrick went well. May be getting [employer] PNS system as an option of pain treatment in a few weeks.    Pertinent History SNK:[id number], hyperlipidemia, smoking, breast cancer, R carotid stenosis requiring emergent thrombectomy; 11/23/2019:  surgery to remove infected lymph node R groin    Patient Stated Goals Pt wants all the movement back in L side.    Currently in Pain? No/denies    Pain Onset In the past 7 days                             [facility] Adult PT Treatment/Exercise - 12/25/19 0001      Ambulation/Gait   Ambulation/Gait Yes    Ambulation/Gait Assistance 5: Supervision    Ambulation/Gait Assistance Details  completed ambulation x 250 ft, cues for improved posture and abdominal activation with ambulation    Ambulation Distance (Feet) 250 Feet    Assistive device None    Gait Pattern Step-through pattern;Decreased stance time - left;Decreased arm swing - left;Trendelenburg;Lateral trunk lean to left    Ambulation Surface Level;Indoor    Stairs Yes    Stairs Assistance 5: Supervision    Stairs Assistance Details (indicate cue type and reason) completed ascending/descending stairs without rails and alternating pattern    Stair Management Technique No rails;Alternating pattern;Forwards    Number of Stairs 8    Height of Stairs 6      Neuro Re-ed    Neuro Re-ed Details  Completed static standing with mirror placed in front for visual feedback, completed SLS activity with opposite LE placed on soccer ball  completed fwd/back roll 2 x 1 minute each. Increased difficulty noted with RLE on ball and stance on LLE. PT providing manual faciliation at pelvis/trunk for improved alignment, as well as verbal cues for improved posture. Standing on 4" step completed forward step down touch with alternating BLE, with focus on control and improved posture. Completed x 15 reps alternating BLE, without UE support. Increased verbal cues required for posture. Progressed to completing lateral step down with toe touch x 10 reps on each side. Increased challenge noted without UE support and maintaing proper posture.       Exercises   Exercises Other Exercises    Other Exercises  Supine with green therapy ball under BLE, completed alternating trunk rotations to R/L x 15 reps bilaterally, verbal cues for improved abdominal activation with completion. Due to tightness in lumbar paraspinals, attempted to complete seated trunk flexion stretch with therapy ball, completed x 2 reps with RLE on ball, increased pain reported in low back.                    PT Short Term Goals - 12/22/19 2041      PT SHORT TERM GOAL #1   Title Pt will be independent with progression of HEP for improved balance, strength, gait.  TARGET 3 weeks:  12/18/2019    Baseline 10/21/19: pt. verbalizes independence with HEP.    Time 3    Period Weeks    Status Achieved      PT SHORT TERM GOAL #2   Title Pt will improve 5x sit<>stand score to less than or equal to 10 seconds for improved functional strength.    Baseline 12/03/2019:  10.12 sec; 12/21/2019:  10.15 sec; 8.69 sec at best    Time 3    Period Weeks    Status Achieved      PT SHORT TERM GOAL #3   Title Pt will ambulate at least 4 ft/sec for improved ability to follow toddler grandchild with caregiving.    Baseline 3.58 ft/sec; 9/13:  9.43 sec = 3.47 ft/sec; fast: 8.31 sec = 3.95 ft/sec 12/21/2019    Time 3    Period Weeks    Status Partially Met             PT Long Term Goals  - 12/04/19 1744      PT LONG TERM GOAL #1   Title Pt will verbalize plans for continued community fitness upon d/c from PT, to maximize gains made in PT.  TARGET 6 weeks, = 01/08/2020    Time 6    Period Weeks    Status New      PT LONG  TERM GOAL #2   Title Pt will perform 6 MWT, at least 1250 ft, with no more than 2 point increase in pain for improved posture and efficiency with gait.    Baseline 1200 ft, 4-5/10 pain.    Time 6    Period Weeks    Status New      PT LONG TERM GOAL #3   Title Pt will demo ability to jog 20 ft independently, for improved ability to help care for 2 yo grandchild.    Time 6    Period Weeks    Status New      PT LONG TERM GOAL #4   Title FOTO score to improve by at least 10 % for improved overall reported functional mobility.    Time 6    Period Weeks    Status New      PT LONG TERM GOAL #5   Title Pt will ambulate at least 1000 ft, outdoor and indoor surfaces, appropriate device as needed, modified independently, for improved community gait.    Baseline Not addressed due to heat today; pt does walk 1200 ft in 6 MWT    Time 6    Period Weeks    Status On-going                 Plan - 12/25/19 1326    Clinical Impression Statement Today's session included continued balance activites to promote SLS and improved posture/trunk alignment with activites. Patient continues to demonstrate lateral trunk lean requiring verbal/tactile cues from PT. Will continue to progress toward all goals.    Personal Factors and Comorbidities Comorbidity 3+    Comorbidities PMH:  diabetes, hyperlipidemia, smoking, breast cancer, R carotid stenosis requiring emergent thrombectomy, anxiety depression    Examination-Activity Limitations Locomotion Level;Transfers;Reach Overhead;Stand;Stairs;Lift;Dressing    Examination-Participation Restrictions Community Activity;Other   caring for grandchild   Stability/Clinical Decision Making Evolving/Moderate complexity    Rehab  Potential Good    PT Frequency Other (comment)   1x/wk (wk of 8/27) and then 2x/wk for 5 weeks   PT Duration 6 weeks   total POC = 6 weeks   PT Treatment/Interventions ADLs/Self Care Home Management;Electrical Stimulation;DME Instruction;Gait training;Stair training;Functional mobility training;Therapeutic activities;Therapeutic exercise;Balance training;Neuromuscular re-education;Manual techniques;Orthotic Fit/Training;Patient/family education;Passive range of motion;Aquatic Therapy    PT Next Visit Plan Work on trunk strengthening; work on SLS and reliance on LLE with partial tandem on incline/decline and curb step (no UE support to avoid trunk lean); balance on complianat surfaces, gait training and trunk controlw tih gait    PT Home Exercise Plan Access Code: [email or web address]    Consulted and Agree with Plan of Care Patient           Patient will benefit from skilled therapeutic intervention in order to improve the following deficits and impairments:  Abnormal gait, Decreased coordination, Difficulty walking, Impaired tone, Impaired UE functional use, Decreased activity tolerance, Decreased balance, Decreased mobility, Decreased strength, Postural dysfunction  Visit Diagnosis: Abnormal posture  Muscle weakness (generalized)  Other abnormalities of gait and mobility  Unsteadiness on feet     Problem List Patient Active Problem List   Diagnosis Date Noted  . Lymph node enlargement   . Cellulitis of groin, right   . Cellulitis   . Sepsis ([place]) 11/20/2019  . Acute inguinal lymphadenitis   . Vascular headache   . Other chronic pain   . Uncontrolled type 2 diabetes mellitus with hyperglycemia ([place])   . Labile blood glucose   . Panic disorder  with agoraphobia and moderate panic attacks   . Major depressive disorder, recurrent episode, moderate ([place])   . MCI (mild cognitive impairment) with memory loss   . Acute ischemic right middle cerebral artery (MCA) stroke ([place]) 03/09/2019  . Left  hemiparesis ([place])   . Bradycardia   . Tachypnea   . SIRS (systemic inflammatory response syndrome) ([facility])   . Hypokalemia   . Diabetes mellitus type 2 in obese ([place])   . Tobacco abuse   . History of breast cancer   . Cognitive deficits   . Dysphagia, post-stroke   . Stroke (cerebrum) ([place]) 03/02/2019  . Middle cerebral artery embolism, right 03/02/2019  . Chest pain 12/19/2015  . Numbness 12/19/2015  . Femoral hernia 03/24/2014  . Dyslipidemia 09/10/2011  . BRCA1 positive 09/10/2011  . Breast cancer ([place]) 09/10/2011  . H/O gastric bypass; 05/14/11([facility]) 09/10/2011  . Essential hypertension 10/28/2008  . DEPRESSION/ANXIETY 02/19/2007  . CARPAL TUNNEL SYNDROME, RIGHT 02/19/2007  . ALLERGIC RHINITIS 02/19/2007  . GERD 02/19/2007  . IRRITABLE BOWEL SYNDROME 02/19/2007  . HERPES GENITALIS 12/30/2006  . Diabetes ([place]) 12/30/2006  . OSTEOPENIA 12/30/2006  . ANOMALY, CONGENITAL, NERVOUS SYSTEM NEC 12/30/2006    Jones Bales, PT, DPT 12/25/2019, 1:29 PM  [facility] [address] [place], [place], [address] Phone: [phone]   Fax:  [phone]  Name: Ann Lopez MRN: [id number] Date of Birth: 04/13/1970

## 2019-12-29 ENCOUNTER — Ambulatory Visit: Payer: Medicare Other | Admitting: Occupational Therapy

## 2019-12-29 ENCOUNTER — Ambulatory Visit: Payer: Medicare Other | Admitting: Physical Therapy

## 2019-12-29 ENCOUNTER — Other Ambulatory Visit: Payer: Self-pay

## 2019-12-29 ENCOUNTER — Encounter: Payer: Self-pay | Admitting: Occupational Therapy

## 2019-12-29 DIAGNOSIS — R278 Other lack of coordination: Secondary | ICD-10-CM

## 2019-12-29 DIAGNOSIS — M6281 Muscle weakness (generalized): Secondary | ICD-10-CM

## 2019-12-29 DIAGNOSIS — R2681 Unsteadiness on feet: Secondary | ICD-10-CM

## 2019-12-29 DIAGNOSIS — R293 Abnormal posture: Secondary | ICD-10-CM

## 2019-12-29 DIAGNOSIS — I69354 Hemiplegia and hemiparesis following cerebral infarction affecting left non-dominant side: Secondary | ICD-10-CM | POA: Diagnosis not present

## 2019-12-29 DIAGNOSIS — R29898 Other symptoms and signs involving the musculoskeletal system: Secondary | ICD-10-CM

## 2019-12-29 DIAGNOSIS — R29818 Other symptoms and signs involving the nervous system: Secondary | ICD-10-CM

## 2019-12-29 DIAGNOSIS — M25512 Pain in left shoulder: Secondary | ICD-10-CM

## 2019-12-29 NOTE — Therapy (Signed)
[facility] [address] [place], [place], [address] Phone: [phone]   Fax:  [phone]  Physical Therapy Treatment  Patient Details  Name: Ann Lopez MRN: [id number] Date of Birth: [birth year] Referring Provider (PT): Dr. Leeroy Cha   Encounter Date: 12/29/2019   PT End of Session - 12/29/19 2056    Visit Number 30    Number of Visits 34    Date for PT Re-Evaluation 79/02/40   per recert 9/73/5329   Authorization Type UHC Medicare/Medicaid    Progress Note Due on Visit 34   Progress note completed at 11/05/2019 visit, #19   PT Start Time 9242    PT Stop Time 1400    PT Time Calculation (min) 43 min    Activity Tolerance Patient tolerated treatment well;Patient limited by pain   back pain rated as 2/10 at end of session   Behavior During Therapy [facility] for tasks assessed/performed           Past Medical History:  Diagnosis Date  . Allergy   . Anxiety   . Asthma   . Back pain   . Breast cancer ([place])   . Cancer ([place])   . Depression   . Diabetes mellitus   . Headache   . Hyperlipemia   . Osteopenia   . Osteoporosis    osteopenia  . Stroke [facility])     Past Surgical History:  Procedure Laterality Date  . ABDOMINAL HYSTERECTOMY     B oophorectomy for BRCA1 gene  . BREAST SURGERY    . CESAREAN SECTION    . INCISION AND DRAINAGE ABSCESS Left 11/14/2012   Procedure: INCISION AND DRAINAGE ABSCESS LEFT GROIN;  Surgeon: Jamesetta So, MD;  Location: [facility];  Service: General;  Laterality: Left;  . INGUINAL LYMPH NODE BIOPSY Right 11/23/2019   Procedure: INGUINAL LYMPH NODE BIOPSY;  Surgeon: Aviva Signs, MD;  Location: [facility];  Service: General;  Laterality: Right;  . IR ANGIO VERTEBRAL SEL SUBCLAVIAN INNOMINATE UNI R MOD SED  03/02/2019  . IR CT HEAD LTD  03/02/2019  . IR INTRAVSC STENT CERV CAROTID W/O EMB-PROT MOD SED INC ANGIO  03/02/2019  . IR PERCUTANEOUS ART THROMBECTOMY/INFUSION INTRACRANIAL  INC DIAG ANGIO  03/02/2019  . LYMPHADENECTOMY    . MASTECTOMY Bilateral   . RADIOLOGY WITH ANESTHESIA N/A 03/02/2019   Procedure: IR WITH ANESTHESIA;  Surgeon: Luanne Bras, MD;  Location: [facility];  Service: Radiology;  Laterality: N/A;    There were no vitals filed for this visit.   Subjective Assessment - 12/29/19 1318    Subjective Concert went well, was able to walk the distance and the hill without difficulty.  Right now-back is okay.  Pain in L arm is better after OT.    Pertinent History AST:[id number], hyperlipidemia, smoking, breast cancer, R carotid stenosis requiring emergent thrombectomy; 11/23/2019:  surgery to remove infected lymph node R groin    Patient Stated Goals Pt wants all the movement back in L side.    Currently in Pain? No/denies    Pain Onset In the past 7 days                             [facility] Adult PT Treatment/Exercise - 12/29/19 1321      Ambulation/Gait   Ambulation/Gait Yes    Ambulation/Gait Assistance 5: Supervision    Ambulation/Gait Assistance Details clinic distances between activities.  PT provides cues  for upright posture and abdominal activation    Assistive device None    Gait Pattern Step-through pattern;Decreased stance time - left;Decreased arm swing - left;Trendelenburg;Lateral trunk lean to left    Ambulation Surface Level;Indoor      Exercises   Exercises Other Exercises    Other Exercises  Supine with green therapy ball under BLE, completed alternating trunk rotations to R/L x 15 reps bilaterally, verbal cues for improved abdominal activation with completion.   With BLE over green therapy ball, also performed hip flexion/extension with cues for abdominal activaiton, x 15 reps.  Bridging x 10 reps, then bridge position clamshells x 10 reps.  Cues for abdominal activation.  Seated edge of mat rhythmic stabilization with resistance given at shoulders in anterior/posterior direction for core stability, x 10 reps.       Knee/Hip Exercises: Stretches   Press photographer Right;Left;3 reps;20 seconds    Gastroc Stretch Limitations also performed with slight L knee bed for lower stretch at heelcords    Other Knee/Hip Stretches Standing gastroc stretch with L foot propped at step, 2 reps x 20 seconds, cues for technique (pt reports this stretches better)      Knee/Hip Exercises: Aerobic   Nustep BLEs only, Level 3 x 3 minutes, then Level 5 x 2 minutes, attempting to add BUEs, but did not due to pain in LUE.  Cues throughout for positioning of LLE to avoid LLE external rotation.            Neuro Re-education: -Standing on incline/decline, feet apart head turns x 5 reps/head nods x 5 reps, then EC x 10 sec.   -Partial tandem stance with LLE posterior, with head turns/head nods x 5 reps each, then EC x 10 sec with min guard assist.  PT provides tactile cues at left trunk and verbal cues for abodminal activation.   -Single limb stance with LLE as SLS and RLE propped at curb step, visual cues of mirror with head turns/head nods x 5 reps, 2 sets for improved trunk control and posture.   PT provides tactile cues throughout to relax at shoulders.  -LLE as SLS, RLE tapping to curb step, 5 reps, 2 sets with tactile cues provided at L trunk and at shoulders to relax through shoulders.         PT Short Term Goals - 12/22/19 2041      PT SHORT TERM GOAL #1   Title Pt will be independent with progression of HEP for improved balance, strength, gait.  TARGET 3 weeks:  12/18/2019    Baseline 10/21/19: pt. verbalizes independence with HEP.    Time 3    Period Weeks    Status Achieved      PT SHORT TERM GOAL #2   Title Pt will improve 5x sit<>stand score to less than or equal to 10 seconds for improved functional strength.    Baseline 12/03/2019:  10.12 sec; 12/21/2019:  10.15 sec; 8.69 sec at best    Time 3    Period Weeks    Status Achieved      PT SHORT TERM GOAL #3   Title Pt will ambulate at least 4 ft/sec for  improved ability to follow toddler grandchild with caregiving.    Baseline 3.58 ft/sec; 9/13:  9.43 sec = 3.47 ft/sec; fast: 8.31 sec = 3.95 ft/sec 12/21/2019    Time 3    Period Weeks    Status Partially Met  PT Long Term Goals - 12/04/19 1744      PT LONG TERM GOAL #1   Title Pt will verbalize plans for continued community fitness upon d/c from PT, to maximize gains made in PT.  TARGET 6 weeks, = 01/08/2020    Time 6    Period Weeks    Status New      PT LONG TERM GOAL #2   Title Pt will perform 6 MWT, at least 1250 ft, with no more than 2 point increase in pain for improved posture and efficiency with gait.    Baseline 1200 ft, 4-5/10 pain.    Time 6    Period Weeks    Status New      PT LONG TERM GOAL #3   Title Pt will demo ability to jog 20 ft independently, for improved ability to help care for 2 yo grandchild.    Time 6    Period Weeks    Status New      PT LONG TERM GOAL #4   Title FOTO score to improve by at least 10 % for improved overall reported functional mobility.    Time 6    Period Weeks    Status New      PT LONG TERM GOAL #5   Title Pt will ambulate at least 1000 ft, outdoor and indoor surfaces, appropriate device as needed, modified independently, for improved community gait.    Baseline Not addressed due to heat today; pt does walk 1200 ft in 6 MWT    Time 6    Period Weeks    Status On-going                 Plan - 12/29/19 2059    Clinical Impression Statement Today's skilled PT session focused on trunk/core stability,NMR for SLS on LLE for improved posture/trunk alignment, and lower extremity strengthening.  Pt conitnues to need multiple cues at various areas (shoulders, trunk, L hip) to help with postural alignment and stability.    Personal Factors and Comorbidities Comorbidity 3+    Comorbidities PMH:  diabetes, hyperlipidemia, smoking, breast cancer, R carotid stenosis requiring emergent thrombectomy, anxiety depression     Examination-Activity Limitations Locomotion Level;Transfers;Reach Overhead;Stand;Stairs;Lift;Dressing    Examination-Participation Restrictions Community Activity;Other   caring for grandchild   Stability/Clinical Decision Making Evolving/Moderate complexity    Rehab Potential Good    PT Frequency Other (comment)   1x/wk (wk of 8/27) and then 2x/wk for 5 weeks   PT Duration 6 weeks   total POC = 6 weeks   PT Treatment/Interventions ADLs/Self Care Home Management;Electrical Stimulation;DME Instruction;Gait training;Stair training;Functional mobility training;Therapeutic activities;Therapeutic exercise;Balance training;Neuromuscular re-education;Manual techniques;Orthotic Fit/Training;Patient/family education;Passive range of motion;Aquatic Therapy    PT Next Visit Plan Continue to work on trunk strengthening; work on SLS and reliance on LLE with partial tandem on incline/decline and curb step (no UE support to avoid trunk lean); balance on complianat surfaces, gait training and trunk controlw tih gait.  Need to look at how she's doing towards 6 MWT goal    PT Home Exercise Plan Access Code: [email or web address]    Consulted and Agree with Plan of Care Patient           Patient will benefit from skilled therapeutic intervention in order to improve the following deficits and impairments:  Abnormal gait, Decreased coordination, Difficulty walking, Impaired tone, Impaired UE functional use, Decreased activity tolerance, Decreased balance, Decreased mobility, Decreased strength, Postural dysfunction  Visit Diagnosis: Abnormal posture  Unsteadiness on  feet  Muscle weakness (generalized)     Problem List Patient Active Problem List   Diagnosis Date Noted  . Lymph node enlargement   . Cellulitis of groin, right   . Cellulitis   . Sepsis ([place]) 11/20/2019  . Acute inguinal lymphadenitis   . Vascular headache   . Other chronic pain   . Uncontrolled type 2 diabetes mellitus with hyperglycemia ([place])   .  Labile blood glucose   . Panic disorder with agoraphobia and moderate panic attacks   . Major depressive disorder, recurrent episode, moderate ([place])   . MCI (mild cognitive impairment) with memory loss   . Acute ischemic right middle cerebral artery (MCA) stroke ([place]) 03/09/2019  . Left hemiparesis ([place])   . Bradycardia   . Tachypnea   . SIRS (systemic inflammatory response syndrome) ([facility])   . Hypokalemia   . Diabetes mellitus type 2 in obese ([place])   . Tobacco abuse   . History of breast cancer   . Cognitive deficits   . Dysphagia, post-stroke   . Stroke (cerebrum) ([place]) 03/02/2019  . Middle cerebral artery embolism, right 03/02/2019  . Chest pain 12/19/2015  . Numbness 12/19/2015  . Femoral hernia 03/24/2014  . Dyslipidemia 09/10/2011  . BRCA1 positive 09/10/2011  . Breast cancer ([place]) 09/10/2011  . H/O gastric bypass; 05/14/11([facility]) 09/10/2011  . Essential hypertension 10/28/2008  . DEPRESSION/ANXIETY 02/19/2007  . CARPAL TUNNEL SYNDROME, RIGHT 02/19/2007  . ALLERGIC RHINITIS 02/19/2007  . GERD 02/19/2007  . IRRITABLE BOWEL SYNDROME 02/19/2007  . HERPES GENITALIS 12/30/2006  . Diabetes ([place]) 12/30/2006  . OSTEOPENIA 12/30/2006  . ANOMALY, CONGENITAL, NERVOUS SYSTEM NEC 12/30/2006    Karlin Heilman W. 12/29/2019, 9:02 PM  Frazier Butt., PT   [facility] [address] [place], [place], [address] Phone: [phone]   Fax:  [phone]  Name: Ann Lopez MRN: [id number] Date of Birth: 1970-10-17

## 2019-12-29 NOTE — Therapy (Signed)
[facility] [place] [address] [place], [place], [address] Phone: [phone]   Fax:  [phone]  Occupational Therapy Treatment  Patient Details  Name: Ann Lopez MRN: [id number] Date of Birth: [birth year] Referring Provider (OT): Dr. Leeroy Cha   Encounter Date: 12/29/2019   OT End of Session - 12/29/19 1331    OT Start Time 1232    OT Stop Time 1315    OT Time Calculation (min) 43 min    Activity Tolerance Patient tolerated treatment well    Behavior During Therapy [facility] for tasks assessed/performed           Past Medical History:  Diagnosis Date  . Allergy   . Anxiety   . Asthma   . Back pain   . Breast cancer ([place])   . Cancer ([place])   . Depression   . Diabetes mellitus   . Headache   . Hyperlipemia   . Osteopenia   . Osteoporosis    osteopenia  . Stroke [facility])     Past Surgical History:  Procedure Laterality Date  . ABDOMINAL HYSTERECTOMY     B oophorectomy for BRCA1 gene  . BREAST SURGERY    . CESAREAN SECTION    . INCISION AND DRAINAGE ABSCESS Left 11/14/2012   Procedure: INCISION AND DRAINAGE ABSCESS LEFT GROIN;  Surgeon: Jamesetta So, MD;  Location: [facility];  Service: General;  Laterality: Left;  . INGUINAL LYMPH NODE BIOPSY Right 11/23/2019   Procedure: INGUINAL LYMPH NODE BIOPSY;  Surgeon: Aviva Signs, MD;  Location: [facility];  Service: General;  Laterality: Right;  . IR ANGIO VERTEBRAL SEL SUBCLAVIAN INNOMINATE UNI R MOD SED  03/02/2019  . IR CT HEAD LTD  03/02/2019  . IR INTRAVSC STENT CERV CAROTID W/O EMB-PROT MOD SED INC ANGIO  03/02/2019  . IR PERCUTANEOUS ART THROMBECTOMY/INFUSION INTRACRANIAL INC DIAG ANGIO  03/02/2019  . LYMPHADENECTOMY    . MASTECTOMY Bilateral   . RADIOLOGY WITH ANESTHESIA N/A 03/02/2019   Procedure: IR WITH ANESTHESIA;  Surgeon: Luanne Bras, MD;  Location: [place];  Service: Radiology;  Laterality: N/A;    There were no vitals filed for this visit.    Subjective Assessment - 12/29/19 1314    Subjective  My shoulder is hurting again.  The doctor thinks its more frozen shoulder and less spasticity.    Pertinent History R MCA  stroke with L shoulder pain.  PMH:  diabetes, anxiety, depression hyperlipidemia, smoking, breast cancer s/p bilateral mastectomy followed by chmo in 2009, R carotid stenosis requiring emergent thrombectomy    Limitations fall risk, pain    Patient Stated Goals improve L shoudler pain and ROM, be able to care for grandchild alone    Currently in Pain? Yes    Pain Score 4     Pain Location Shoulder    Pain Orientation Left    Pain Descriptors / Indicators Aching    Pain Type Acute pain    Pain Onset In the past 7 days    Pain Frequency Constant    Aggravating Factors  frozen shoulder    Pain Relieving Factors pain meds, heat                        OT Treatments/Exercises (OP) - 12/29/19 0001      Neurological Re-education Exercises   Other Exercises 1 Patient's pain relieved with heat and gentle mobilization.  Followed with active assisted GH joint motion in pain  free ranges.  Patient to follow up with ortho for possible injection, and with PM&R for possible stimulator.      Moist Heat Therapy   Number Minutes Moist Heat 10 Minutes    Moist Heat Location Shoulder      Manual Therapy   Manual Therapy Passive ROM;Scapular mobilization    Scapular Mobilization In all planes to address scapular and GH joint mobility     Passive ROM Gentle motion in dependent humeral position to address small ranges of abductiuon and external rotation.                    OT Education - 12/29/19 1329    Education Details advised patient to try the pain relief cream and new pain medication    Person(s) Educated Patient    Methods Explanation    Comprehension Verbalized understanding            OT Short Term Goals - 12/29/19 1333      OT SHORT TERM GOAL #1   Title Pt will be independent with initial  HEP.--check STGs 11/13/19 (extended due to scheduling)    Time 4    Period Weeks    Status Achieved      OT SHORT TERM GOAL #2   Title Pt will demo at least 70* L shoulder flexion for functional reaching without pain.    Baseline 40*    Time 4    Period Weeks    Status Achieved   10/29/19:  approx 90*     OT SHORT TERM GOAL #3   Title Pt will verbalize understanding of proper positioning of LUE for decr pain.    Time 4    Period Weeks    Status Achieved      OT SHORT TERM GOAL #4   Title Pt will be able to use LUE in simple light home maintenance tasks as non-dominant assist at least 50% of the time with pain less than 3/10 (wiping table, folding clothes, sweeping).    Time 4    Period Weeks    Status Achieved      OT SHORT TERM GOAL #5   Title Pt will demo improved LUE functional reaching for ADLs as shown by improving score on box and blocks test by at least 6.    Baseline 28 blocks    Time 4    Period Weeks    Status Achieved   36     OT SHORT TERM GOAL #6   Title Pt will verbalize understanding of adaptive strategies/AE to incr independence with cooking tasks.    Time 4    Period Weeks    Status Achieved             OT Long Term Goals - 12/29/19 1333      OT LONG TERM GOAL #1   Title Pt will be independent with updated HEP for LUE--check LTGs 11/24/19    Status On-going      OT LONG TERM GOAL #2   Title Pt will demo at least 90* L shoulder flex without pain for functional reaching.    Baseline 40* initally. 80* 12/15/19 seated.  90* supine 12/15/19    Time 8    Period Weeks    Status On-going      OT LONG TERM GOAL #3   Title Pt will be able to use LUE as nondominant assist for functional tasks at least 75% of the time for light ADLs and  IADLs with pain consistently less than or equal to 2/10.    Time 8    Period Weeks    Status On-going      OT LONG TERM GOAL #4   Title Pt will improve L grip strength by at least 8lbs to assist in opening containers/home  maintenance tasks.    Baseline 42.7lbs initially, 46.7 12/15/19    Time 8    Period Weeks    Status On-going      OT LONG TERM GOAL #5   Title Pt will improve LUE coordination as shown by improving time on 9-hole peg test by at least 8sec.    Baseline 40.81sec initally, 38.78 12/15/19    Time 8    Period Weeks    Status On-going      OT LONG TERM GOAL #6   Title Pt will improve LUE functional reaching for ADLs as shown by improving score on box and blocks test by at least 10.    Baseline 28 initially, 39 - 12/15/19    Time 8    Period Weeks    Status Achieved                 Plan - 12/29/19 1331    Clinical Impression Statement Patient limited by shoulder pain today - has not been seen in two weeks due to limited appointment availability - responded well to modalities and mobilization to reduce pain.    OT Occupational Profile and History Detailed Assessment- Review of Records and additional review of physical, cognitive, psychosocial history related to current functional performance    Occupational performance deficits (Please refer to evaluation for details): IADL's;ADL's;Leisure    Body Structure / Function / Physical Skills ADL;Balance;IADL;ROM;Strength;Tone;FMC;Coordination;UE functional use;Decreased knowledge of precautions;GMC;Decreased knowledge of use of DME;Pain    Rehab Potential Good    Clinical Decision Making Several treatment options, min-mod task modification necessary    Comorbidities Affecting Occupational Performance: May have comorbidities impacting occupational performance    Modification or Assistance to Complete Evaluation  Min-Moderate modification of tasks or assist with assess necessary to complete eval    OT Frequency 3x / week    OT Duration 8 weeks    OT Treatment/Interventions Self-care/ADL training;Moist Heat;DME and/or AE instruction;Therapeutic activities;Aquatic Therapy;Therapeutic exercise;Ultrasound;Passive range of motion;Functional Mobility  Training;Neuromuscular education;Cryotherapy;Electrical Stimulation;Manual Therapy;Patient/family education    Plan ultrasound and manual prn, neuro re-ed, coordination HEP/functional reach with focus on shoulder positioning; begin checking goals next week for anticipated renewal    OT Home Exercise Plan supine bilateral shoulder exercises    Consulted and Agree with Plan of Care Patient           Patient will benefit from skilled therapeutic intervention in order to improve the following deficits and impairments:   Body Structure / Function / Physical Skills: ADL, Balance, IADL, ROM, Strength, Tone, FMC, Coordination, UE functional use, Decreased knowledge of precautions, GMC, Decreased knowledge of use of DME, Pain       Visit Diagnosis: Hemiplegia and hemiparesis following cerebral infarction affecting left non-dominant side (HCC)  Abnormal posture  Muscle weakness (generalized)  Acute pain of left shoulder  Unsteadiness on feet  Other symptoms and signs involving the musculoskeletal system  Other lack of coordination  Other symptoms and signs involving the nervous system    Problem List Patient Active Problem List   Diagnosis Date Noted  . Lymph node enlargement   . Cellulitis of groin, right   . Cellulitis   . Sepsis ([place])  11/20/2019  . Acute inguinal lymphadenitis   . Vascular headache   . Other chronic pain   . Uncontrolled type 2 diabetes mellitus with hyperglycemia ([place])   . Labile blood glucose   . Panic disorder with agoraphobia and moderate panic attacks   . Major depressive disorder, recurrent episode, moderate ([place])   . MCI (mild cognitive impairment) with memory loss   . Acute ischemic right middle cerebral artery (MCA) stroke ([place]) 03/09/2019  . Left hemiparesis ([place])   . Bradycardia   . Tachypnea   . SIRS (systemic inflammatory response syndrome) (HCC)   . Hypokalemia   . Diabetes mellitus type 2 in obese ([place])   . Tobacco abuse   . History of  breast cancer   . Cognitive deficits   . Dysphagia, post-stroke   . Stroke (cerebrum) ([place]) 03/02/2019  . Middle cerebral artery embolism, right 03/02/2019  . Chest pain 12/19/2015  . Numbness 12/19/2015  . Femoral hernia 03/24/2014  . Dyslipidemia 09/10/2011  . BRCA1 positive 09/10/2011  . Breast cancer ([place]) 09/10/2011  . H/O gastric bypass; 05/14/11([facility]) 09/10/2011  . Essential hypertension 10/28/2008  . DEPRESSION/ANXIETY 02/19/2007  . CARPAL TUNNEL SYNDROME, RIGHT 02/19/2007  . ALLERGIC RHINITIS 02/19/2007  . GERD 02/19/2007  . IRRITABLE BOWEL SYNDROME 02/19/2007  . HERPES GENITALIS 12/30/2006  . Diabetes ([place]) 12/30/2006  . OSTEOPENIA 12/30/2006  . ANOMALY, CONGENITAL, NERVOUS SYSTEM NEC 12/30/2006    Mariah Milling, OTR/L 12/29/2019, 1:34 PM  [place] [address] [place], [place], [address] Phone: [phone]   Fax:  [phone]  Name: YUNIQUE DEARCOS MRN: [id number] Date of Birth: 15-Aug-1970

## 2019-12-31 ENCOUNTER — Other Ambulatory Visit: Payer: Self-pay

## 2019-12-31 ENCOUNTER — Ambulatory Visit: Payer: Medicare Other | Admitting: Physical Therapy

## 2019-12-31 DIAGNOSIS — R2681 Unsteadiness on feet: Secondary | ICD-10-CM

## 2019-12-31 DIAGNOSIS — R293 Abnormal posture: Secondary | ICD-10-CM

## 2019-12-31 DIAGNOSIS — R2689 Other abnormalities of gait and mobility: Secondary | ICD-10-CM

## 2019-12-31 DIAGNOSIS — I69354 Hemiplegia and hemiparesis following cerebral infarction affecting left non-dominant side: Secondary | ICD-10-CM | POA: Diagnosis not present

## 2019-12-31 NOTE — Therapy (Addendum)
[place] [address] [place], [place], [address] Phone: [phone]   Fax:  [phone]  Physical Therapy Treatment  Patient Details  Name: Ann Lopez MRN: [id number] Date of Birth: Nov 24, 1970 Referring Provider (PT): Dr. Leeroy Cha   Encounter Date: 12/31/2019   PT End of Session - 12/31/19 1630    Visit Number 31    Number of Visits 34    Date for PT Re-Evaluation 41/66/06   per recert 06/07/6008   Authorization Type [employer] Medicare/Medicaid    Progress Note Due on Visit 34   Progress note completed at 11/05/2019 visit, #19   Activity Tolerance Patient tolerated treatment well;Patient limited by pain   back pain rated as 2/10 at end of session   Behavior During Therapy [facility] for tasks assessed/performed           Past Medical History:  Diagnosis Date  . Allergy   . Anxiety   . Asthma   . Back pain   . Breast cancer ([place])   . Cancer ([place])   . Depression   . Diabetes mellitus   . Headache   . Hyperlipemia   . Osteopenia   . Osteoporosis    osteopenia  . Stroke [facility])     Past Surgical History:  Procedure Laterality Date  . ABDOMINAL HYSTERECTOMY     B oophorectomy for BRCA1 gene  . BREAST SURGERY    . CESAREAN SECTION    . INCISION AND DRAINAGE ABSCESS Left 11/14/2012   Procedure: INCISION AND DRAINAGE ABSCESS LEFT GROIN;  Surgeon: Jamesetta So, MD;  Location: [facility];  Service: General;  Laterality: Left;  . INGUINAL LYMPH NODE BIOPSY Right 11/23/2019   Procedure: INGUINAL LYMPH NODE BIOPSY;  Surgeon: Aviva Signs, MD;  Location: [facility];  Service: General;  Laterality: Right;  . IR ANGIO VERTEBRAL SEL SUBCLAVIAN INNOMINATE UNI R MOD SED  03/02/2019  . IR CT HEAD LTD  03/02/2019  . IR INTRAVSC STENT CERV CAROTID W/O EMB-PROT MOD SED INC ANGIO  03/02/2019  . IR PERCUTANEOUS ART THROMBECTOMY/INFUSION INTRACRANIAL INC DIAG ANGIO  03/02/2019  . LYMPHADENECTOMY    . MASTECTOMY Bilateral   .  RADIOLOGY WITH ANESTHESIA N/A 03/02/2019   Procedure: IR WITH ANESTHESIA;  Surgeon: Luanne Bras, MD;  Location: [place];  Service: Radiology;  Laterality: N/A;    There were no vitals filed for this visit.   Subjective Assessment - 12/31/19 1403    Subjective Some pain in the shoulder today, maybe 2/10.  Back pain is okay.    Pertinent History XNA:TFTDDUKG, hyperlipidemia, smoking, breast cancer, R carotid stenosis requiring emergent thrombectomy; 11/23/2019:  surgery to remove infected lymph node R groin    Patient Stated Goals Pt wants all the movement back in L side.    Currently in Pain? Yes    Pain Score 2     Pain Location Shoulder    Pain Orientation Left    Pain Descriptors / Indicators Aching    Pain Onset In the past 7 days    Pain Frequency Intermittent    Aggravating Factors  frozen shoulder    Pain Relieving Factors pain meds, head                             [facility] Adult PT Treatment/Exercise - 12/31/19 0001      Ambulation/Gait   Ambulation/Gait Yes    Ambulation/Gait Assistance 5: Supervision  Ambulation/Gait Assistance Details 1 standing break outdoors; cues to reset posture and activate abdominal muscles.  Pt reports 3/10 pain after gait.    Ambulation Distance (Feet) 1000 Feet   outdoors; 230 ft indoors   Assistive device None    Gait Pattern Step-through pattern;Decreased stance time - left;Decreased arm swing - left;Trendelenburg;Lateral trunk lean to left    Ambulation Surface Level;Indoor;Unlevel;Outdoor    [employer] Activities Standing heelcord stretch, foot propped at 4" cabinet shelf, x 3 reps, 20 sec each leg.    Gait Comments Occasional postural cues with outdoor gait.  Overall less Trendelenburg severity noted throughout gait.      Exercises   Exercises Other Exercises    Other Exercises  Core stability exercises to activate core muscles:  seated at edge of mat with best posture:  rhythmic stabilization with anterior/posterior  resistance given at shoulders, x 10 reps, then x 5 reps.             Neuro Re-education Completed static standing with mirror placed in front for visual feedback,  -completed SLS activity LLE as stance limb, RLE forward taps to 4" aerobic step, 10 reps. (No UE support) -forward step ups LLE leading up/up, down/down x 10, then forward step ups RLE leading up/up, down/down x 10 (no UE support) -Forward step-down taps with pt standing on aerobic step, 2 sets x 10 reps, with LLE as stance, RLE tapping floor.  Then repeated with opposite leg.  (pt eventually needs light R UE support at counter). -Side step up onto aerobic step, 10 reps each side, UE support at counter - PT providing manual faciliation at pelvis/trunk for improved alignment, as well as verbal cues for improved posture. Focus on control/posture throughout standing activities.  Increased challenge noted without UE support and maintaing proper posture.        Additional balance at counter:  Braiding R and L along counter, 2 reps, for improved trunk/pelvic dissociation Marching forward/walk backwards, x 3 reps each at counter; Tandem gait forward/back x 2 reps UE support at counter, then tandem march forward/back x 2 reps each UE support at counter     PT Education - 12/31/19 1629    Education Details Verbally added stand heelcord stretch, foot propped at 4" step, 20-30 second hold    Person(s) Educated Patient    Methods Explanation;Demonstration;Verbal cues    Comprehension Verbalized understanding;Returned demonstration;Verbal cues required            PT Short Term Goals - 12/22/19 2041      PT SHORT TERM GOAL #1   Title Pt will be independent with progression of HEP for improved balance, strength, gait.  TARGET 3 weeks:  12/18/2019    Baseline 10/21/19: pt. verbalizes independence with HEP.    Time 3    Period Weeks    Status Achieved      PT SHORT TERM GOAL #2   Title Pt will improve 5x sit<>stand score to less  than or equal to 10 seconds for improved functional strength.    Baseline 12/03/2019:  10.12 sec; 12/21/2019:  10.15 sec; 8.69 sec at best    Time 3    Period Weeks    Status Achieved      PT SHORT TERM GOAL #3   Title Pt will ambulate at least 4 ft/sec for improved ability to follow toddler grandchild with caregiving.    Baseline 3.58 ft/sec; 9/13:  9.43 sec = 3.47 ft/sec; fast: 8.31 sec = 3.95 ft/sec 12/21/2019  Time 3    Period Weeks    Status Partially Met             PT Long Term Goals - 12/04/19 1744      PT LONG TERM GOAL #1   Title Pt will verbalize plans for continued community fitness upon d/c from PT, to maximize gains made in PT.  TARGET 6 weeks, = 01/08/2020    Time 6    Period Weeks    Status New      PT LONG TERM GOAL #2   Title Pt will perform 6 MWT, at least 1250 ft, with no more than 2 point increase in pain for improved posture and efficiency with gait.    Baseline 1200 ft, 4-5/10 pain.    Time 6    Period Weeks    Status New      PT LONG TERM GOAL #3   Title Pt will demo ability to jog 20 ft independently, for improved ability to help care for 2 yo grandchild.    Time 6    Period Weeks    Status New      PT LONG TERM GOAL #4   Title FOTO score to improve by at least 10 % for improved overall reported functional mobility.    Time 6    Period Weeks    Status New      PT LONG TERM GOAL #5   Title Pt will ambulate at least 1000 ft, outdoor and indoor surfaces, appropriate device as needed, modified independently, for improved community gait.    Baseline Not addressed due to heat today; pt does walk 1200 ft in 6 MWT    Time 6    Period Weeks    Status On-going                 Plan - 12/31/19 1630    Clinical Impression Statement With visual cues of mirror, pt demo improved control on L hip, only needing UE support at very end of standing exercises; decreased shoulder hike and decreased Trendelenburg noted with standing and gait activities  today.  With fatigue at end of long distance gait, pt reports pain 3/10.  Pt will continue to benefit from skilled PT to address strength, trunk control, balance and gait towards overall improved mobility.    Personal Factors and Comorbidities Comorbidity 3+    Comorbidities PMH:  diabetes, hyperlipidemia, smoking, breast cancer, R carotid stenosis requiring emergent thrombectomy, anxiety depression    Examination-Activity Limitations Locomotion Level;Transfers;Reach Overhead;Stand;Stairs;Lift;Dressing    Examination-Participation Restrictions Community Activity;Other   caring for grandchild   Stability/Clinical Decision Making Evolving/Moderate complexity    Rehab Potential Good    PT Frequency Other (comment)   1x/wk (wk of 8/27) and then 2x/wk for 5 weeks   PT Duration 6 weeks   total POC = 6 weeks   PT Treatment/Interventions ADLs/Self Care Home Management;Electrical Stimulation;DME Instruction;Gait training;Stair training;Functional mobility training;Therapeutic activities;Therapeutic exercise;Balance training;Neuromuscular re-education;Manual techniques;Orthotic Fit/Training;Patient/family education;Passive range of motion;Aquatic Therapy    PT Next Visit Plan Check LTGs (pt is scheduled out, but decide renew vs d/c).  Trunk strength, LLE NMR and SLS activities; balance on compliant surfaces; trunk control with gait; ask about/encourage [facility] to [facility]    PT Home Exercise Plan Access Code: [email or web address]    Consulted and Agree with Plan of Care Patient           Patient will benefit from skilled therapeutic intervention in order to improve the following deficits  and impairments:  Abnormal gait, Decreased coordination, Difficulty walking, Impaired tone, Impaired UE functional use, Decreased activity tolerance, Decreased balance, Decreased mobility, Decreased strength, Postural dysfunction  Visit Diagnosis: Unsteadiness on feet  Abnormal posture  Other abnormalities of gait and  mobility     Problem List Patient Active Problem List   Diagnosis Date Noted  . Lymph node enlargement   . Cellulitis of groin, right   . Cellulitis   . Sepsis ([place]) 11/20/2019  . Acute inguinal lymphadenitis   . Vascular headache   . Other chronic pain   . Uncontrolled type 2 diabetes mellitus with hyperglycemia ([place])   . Labile blood glucose   . Panic disorder with agoraphobia and moderate panic attacks   . Major depressive disorder, recurrent episode, moderate ([place])   . MCI (mild cognitive impairment) with memory loss   . Acute ischemic right middle cerebral artery (MCA) stroke ([place]) 03/09/2019  . Left hemiparesis ([place])   . Bradycardia   . Tachypnea   . SIRS (systemic inflammatory response syndrome) (HCC)   . Hypokalemia   . Diabetes mellitus type 2 in obese ([place])   . Tobacco abuse   . History of breast cancer   . Cognitive deficits   . Dysphagia, post-stroke   . Stroke (cerebrum) ([place]) 03/02/2019  . Middle cerebral artery embolism, right 03/02/2019  . Chest pain 12/19/2015  . Numbness 12/19/2015  . Femoral hernia 03/24/2014  . Dyslipidemia 09/10/2011  . BRCA1 positive 09/10/2011  . Breast cancer ([place]) 09/10/2011  . H/O gastric bypass; 05/14/11([facility]) 09/10/2011  . Essential hypertension 10/28/2008  . DEPRESSION/ANXIETY 02/19/2007  . CARPAL TUNNEL SYNDROME, RIGHT 02/19/2007  . ALLERGIC RHINITIS 02/19/2007  . GERD 02/19/2007  . IRRITABLE BOWEL SYNDROME 02/19/2007  . HERPES GENITALIS 12/30/2006  . Diabetes ([place]) 12/30/2006  . OSTEOPENIA 12/30/2006  . ANOMALY, CONGENITAL, NERVOUS SYSTEM NEC 12/30/2006    Eryc Bodey W. 12/31/2019, 4:35 PM Frazier Butt., PT  [place] [address] [place], [place], [address] Phone: [phone]   Fax:  [phone]  Name: KENNETHA PEARMAN MRN: [id number] Date of Birth: 09-17-1970

## 2020-01-04 ENCOUNTER — Ambulatory Visit: Payer: Medicare Other | Admitting: Occupational Therapy

## 2020-01-04 ENCOUNTER — Encounter: Payer: Self-pay | Admitting: Occupational Therapy

## 2020-01-04 ENCOUNTER — Ambulatory Visit: Payer: Medicare Other | Admitting: Physical Therapy

## 2020-01-04 ENCOUNTER — Other Ambulatory Visit: Payer: Self-pay

## 2020-01-04 DIAGNOSIS — M25512 Pain in left shoulder: Secondary | ICD-10-CM

## 2020-01-04 DIAGNOSIS — R293 Abnormal posture: Secondary | ICD-10-CM

## 2020-01-04 DIAGNOSIS — R29898 Other symptoms and signs involving the musculoskeletal system: Secondary | ICD-10-CM

## 2020-01-04 DIAGNOSIS — R278 Other lack of coordination: Secondary | ICD-10-CM

## 2020-01-04 DIAGNOSIS — R29818 Other symptoms and signs involving the nervous system: Secondary | ICD-10-CM

## 2020-01-04 DIAGNOSIS — I69354 Hemiplegia and hemiparesis following cerebral infarction affecting left non-dominant side: Secondary | ICD-10-CM

## 2020-01-04 DIAGNOSIS — M6281 Muscle weakness (generalized): Secondary | ICD-10-CM

## 2020-01-04 DIAGNOSIS — R2689 Other abnormalities of gait and mobility: Secondary | ICD-10-CM

## 2020-01-04 NOTE — Therapy (Signed)
[facility] [address] [place], [place], [address] Phone: [phone]   Fax:  [phone]  Occupational Therapy Treatment  Patient Details  Name: Ann Lopez MRN: [id number] Date of Birth: 10-Aug-1970 Referring Provider (OT): Dr. Leeroy Cha   Encounter Date: 01/04/2020   OT End of Session - 01/04/20 1546    Visit Number 19    Number of Visits 25    Date for OT Re-Evaluation 02/15/20    Authorization Type UHC Medicare/ Medicaid, covered 100% (medicare guidelines)    Authorization Time Period cert date 4/0/08-67/6/19    Authorization - Number of Visits 19    Progress Note Due on Visit 20    OT Start Time [phone]    OT Stop Time 1015    OT Time Calculation (min) 39 min    Activity Tolerance Patient tolerated treatment well    Behavior During Therapy [facility] for tasks assessed/performed           Past Medical History:  Diagnosis Date  . Allergy   . Anxiety   . Asthma   . Back pain   . Breast cancer ([place])   . Cancer ([place])   . Depression   . Diabetes mellitus   . Headache   . Hyperlipemia   . Osteopenia   . Osteoporosis    osteopenia  . Stroke [facility])     Past Surgical History:  Procedure Laterality Date  . ABDOMINAL HYSTERECTOMY     B oophorectomy for BRCA1 gene  . BREAST SURGERY    . CESAREAN SECTION    . INCISION AND DRAINAGE ABSCESS Left 11/14/2012   Procedure: INCISION AND DRAINAGE ABSCESS LEFT GROIN;  Surgeon: Jamesetta So, MD;  Location: [facility];  Service: General;  Laterality: Left;  . INGUINAL LYMPH NODE BIOPSY Right 11/23/2019   Procedure: INGUINAL LYMPH NODE BIOPSY;  Surgeon: Aviva Signs, MD;  Location: [facility];  Service: General;  Laterality: Right;  . IR ANGIO VERTEBRAL SEL SUBCLAVIAN INNOMINATE UNI R MOD SED  03/02/2019  . IR CT HEAD LTD  03/02/2019  . IR INTRAVSC STENT CERV CAROTID W/O EMB-PROT MOD SED INC ANGIO  03/02/2019  . IR PERCUTANEOUS ART THROMBECTOMY/INFUSION INTRACRANIAL INC  DIAG ANGIO  03/02/2019  . LYMPHADENECTOMY    . MASTECTOMY Bilateral   . RADIOLOGY WITH ANESTHESIA N/A 03/02/2019   Procedure: IR WITH ANESTHESIA;  Surgeon: Luanne Bras, MD;  Location: [facility];  Service: Radiology;  Laterality: N/A;    There were no vitals filed for this visit.   Subjective Assessment - 01/04/20 0940    Subjective  Pt reports that she is going to have another nerve block to L shoulder 10/11 and last time MD wanted therapy right after nerve block    Pertinent History R MCA  stroke with L shoulder pain.  PMH:  diabetes, anxiety, depression hyperlipidemia, smoking, breast cancer s/p bilateral mastectomy followed by chmo in 2009, R carotid stenosis requiring emergent thrombectomy    Limitations fall risk, pain    Patient Stated Goals improve L shoudler pain and ROM, be able to care for grandchild alone    Currently in Pain? Yes    Pain Score 2     Pain Location Shoulder    Pain Onset In the past 7 days           Ultrasound to L lateral shoulder/deltoid due to pain/tightness, 1.0wts/cm2, 1.38mz, 50% pulsed with no adverse reactions x850m.  Supine, gentle joint mobs to L shoulder  and soft tissue mobs.    Sidelying, scapular retraction, depression, ER shoulder flex, abduction with scapular facilitation for incr shoulder mobility.          OT Short Term Goals - 12/29/19 1333      OT SHORT TERM GOAL #1   Title Pt will be independent with initial HEP.--check STGs 11/13/19 (extended due to scheduling)    Time 4    Period Weeks    Status Achieved      OT SHORT TERM GOAL #2   Title Pt will demo at least 70* L shoulder flexion for functional reaching without pain.    Baseline 40*    Time 4    Period Weeks    Status Achieved   10/29/19:  approx 90*     OT SHORT TERM GOAL #3   Title Pt will verbalize understanding of proper positioning of LUE for decr pain.    Time 4    Period Weeks    Status Achieved      OT SHORT TERM GOAL #4   Title Pt will be able to use  LUE in simple light home maintenance tasks as non-dominant assist at least 50% of the time with pain less than 3/10 (wiping table, folding clothes, sweeping).    Time 4    Period Weeks    Status Achieved      OT SHORT TERM GOAL #5   Title Pt will demo improved LUE functional reaching for ADLs as shown by improving score on box and blocks test by at least 6.    Baseline 28 blocks    Time 4    Period Weeks    Status Achieved   36     OT SHORT TERM GOAL #6   Title Pt will verbalize understanding of adaptive strategies/AE to incr independence with cooking tasks.    Time 4    Period Weeks    Status Achieved             OT Long Term Goals - 12/29/19 1333      OT LONG TERM GOAL #1   Title Pt will be independent with updated HEP for LUE--check LTGs 11/24/19    Status On-going      OT LONG TERM GOAL #2   Title Pt will demo at least 90* L shoulder flex without pain for functional reaching.    Baseline 40* initally. 80* 12/15/19 seated.  90* supine 12/15/19    Time 8    Period Weeks    Status On-going      OT LONG TERM GOAL #3   Title Pt will be able to use LUE as nondominant assist for functional tasks at least 75% of the time for light ADLs and IADLs with pain consistently less than or equal to 2/10.    Time 8    Period Weeks    Status On-going      OT LONG TERM GOAL #4   Title Pt will improve L grip strength by at least 8lbs to assist in opening containers/home maintenance tasks.    Baseline 42.7lbs initially, 46.7 12/15/19    Time 8    Period Weeks    Status On-going      OT LONG TERM GOAL #5   Title Pt will improve LUE coordination as shown by improving time on 9-hole peg test by at least 8sec.    Baseline 40.81sec initally, 38.78 12/15/19    Time 8    Period Weeks  Status On-going      OT LONG TERM GOAL #6   Title Pt will improve LUE functional reaching for ADLs as shown by improving score on box and blocks test by at least 10.    Baseline 28 initially, 39 - 12/15/19     Time 8    Period Weeks    Status Achieved                 Plan - 01/04/20 1547    Clinical Impression Statement Pt continues to be limited by shoulder pain, but no pain by end of session.  Pt reports upcoming nerve block.  Pt continues to be limited in ER and high range shoulder flex/abduction.    OT Occupational Profile and History Detailed Assessment- Review of Records and additional review of physical, cognitive, psychosocial history related to current functional performance    Occupational performance deficits (Please refer to evaluation for details): IADL's;ADL's;Leisure    Body Structure / Function / Physical Skills ADL;Balance;IADL;ROM;Strength;Tone;FMC;Coordination;UE functional use;Decreased knowledge of precautions;GMC;Decreased knowledge of use of DME;Pain    Rehab Potential Good    Clinical Decision Making Several treatment options, min-mod task modification necessary    Comorbidities Affecting Occupational Performance: May have comorbidities impacting occupational performance    Modification or Assistance to Complete Evaluation  Min-Moderate modification of tasks or assist with assess necessary to complete eval    OT Frequency 3x / week    OT Duration 8 weeks    OT Treatment/Interventions Self-care/ADL training;Moist Heat;DME and/or AE instruction;Therapeutic activities;Aquatic Therapy;Therapeutic exercise;Ultrasound;Passive range of motion;Functional Mobility Training;Neuromuscular education;Cryotherapy;Electrical Stimulation;Manual Therapy;Patient/family education    Plan Progress note; ultrasound and manual prn, neuro re-ed, coordination HEP, functional reach with focus on shoulder positioning    OT Home Exercise Plan supine bilateral shoulder exercises    Consulted and Agree with Plan of Care Patient           Patient will benefit from skilled therapeutic intervention in order to improve the following deficits and impairments:   Body Structure / Function / Physical  Skills: ADL, Balance, IADL, ROM, Strength, Tone, FMC, Coordination, UE functional use, Decreased knowledge of precautions, GMC, Decreased knowledge of use of DME, Pain       Visit Diagnosis: Acute pain of left shoulder  Hemiplegia and hemiparesis following cerebral infarction affecting left non-dominant side (HCC)  Abnormal posture  Other symptoms and signs involving the musculoskeletal system  Other lack of coordination  Other symptoms and signs involving the nervous system    Problem List Patient Active Problem List   Diagnosis Date Noted  . Lymph node enlargement   . Cellulitis of groin, right   . Cellulitis   . Sepsis ([place]) 11/20/2019  . Acute inguinal lymphadenitis   . Vascular headache   . Other chronic pain   . Uncontrolled type 2 diabetes mellitus with hyperglycemia ([place])   . Labile blood glucose   . Panic disorder with agoraphobia and moderate panic attacks   . Major depressive disorder, recurrent episode, moderate ([place])   . MCI (mild cognitive impairment) with memory loss   . Acute ischemic right middle cerebral artery (MCA) stroke ([place]) 03/09/2019  . Left hemiparesis ([place])   . Bradycardia   . Tachypnea   . SIRS (systemic inflammatory response syndrome) (HCC)   . Hypokalemia   . Diabetes mellitus type 2 in obese ([place])   . Tobacco abuse   . History of breast cancer   . Cognitive deficits   . Dysphagia, post-stroke   .  Stroke (cerebrum) ([place]) 03/02/2019  . Middle cerebral artery embolism, right 03/02/2019  . Chest pain 12/19/2015  . Numbness 12/19/2015  . Femoral hernia 03/24/2014  . Dyslipidemia 09/10/2011  . BRCA1 positive 09/10/2011  . Breast cancer ([place]) 09/10/2011  . H/O gastric bypass; 05/14/11([facility]) 09/10/2011  . Essential hypertension 10/28/2008  . DEPRESSION/ANXIETY 02/19/2007  . CARPAL TUNNEL SYNDROME, RIGHT 02/19/2007  . ALLERGIC RHINITIS 02/19/2007  . GERD 02/19/2007  . IRRITABLE BOWEL SYNDROME 02/19/2007  . HERPES GENITALIS 12/30/2006   . Diabetes ([place]) 12/30/2006  . OSTEOPENIA 12/30/2006  . ANOMALY, CONGENITAL, NERVOUS SYSTEM NEC 12/30/2006    [facility] 01/04/2020, 3:50 PM  [facility] [address] [place], [place], [address] Phone: [phone]   Fax:  [phone]  Name: Ann Lopez MRN: [id number] Date of Birth: 25-Jun-1970   Vianne Bulls, OTR/L [facility] [address] [place], [place]  [address] [phone] phone [phone] 01/04/20 4:13 PM

## 2020-01-05 NOTE — Therapy (Signed)
[facility] [address] [place], [place], [address] Phone: [phone]   Fax:  [phone]  Physical Therapy Treatment  Patient Details  Name: Ann Lopez MRN: [id number] Date of Birth: 1970-07-03 Referring Provider (PT): Dr. Leeroy Cha   Encounter Date: 01/04/2020   PT End of Session - 01/05/20 2107    Visit Number 32    Number of Visits 34    Date for PT Re-Evaluation 89/21/19   per recert 07/25/4079   Authorization Type UHC Medicare/Medicaid    Progress Note Due on Visit 34   Progress note completed at 11/05/2019 visit, #19   PT Start Time 1019    PT Stop Time 1058    PT Time Calculation (min) 39 min    Activity Tolerance Patient tolerated treatment well;Patient limited by pain   3/10 back pain at end of session   Behavior During Therapy [facility] for tasks assessed/performed           Past Medical History:  Diagnosis Date  . Allergy   . Anxiety   . Asthma   . Back pain   . Breast cancer ([place])   . Cancer ([place])   . Depression   . Diabetes mellitus   . Headache   . Hyperlipemia   . Osteopenia   . Osteoporosis    osteopenia  . Stroke [facility])     Past Surgical History:  Procedure Laterality Date  . ABDOMINAL HYSTERECTOMY     B oophorectomy for BRCA1 gene  . BREAST SURGERY    . CESAREAN SECTION    . INCISION AND DRAINAGE ABSCESS Left 11/14/2012   Procedure: INCISION AND DRAINAGE ABSCESS LEFT GROIN;  Surgeon: Jamesetta So, MD;  Location: [facility];  Service: General;  Laterality: Left;  . INGUINAL LYMPH NODE BIOPSY Right 11/23/2019   Procedure: INGUINAL LYMPH NODE BIOPSY;  Surgeon: Aviva Signs, MD;  Location: [facility];  Service: General;  Laterality: Right;  . IR ANGIO VERTEBRAL SEL SUBCLAVIAN INNOMINATE UNI R MOD SED  03/02/2019  . IR CT HEAD LTD  03/02/2019  . IR INTRAVSC STENT CERV CAROTID W/O EMB-PROT MOD SED INC ANGIO  03/02/2019  . IR PERCUTANEOUS ART THROMBECTOMY/INFUSION INTRACRANIAL INC DIAG  ANGIO  03/02/2019  . LYMPHADENECTOMY    . MASTECTOMY Bilateral   . RADIOLOGY WITH ANESTHESIA N/A 03/02/2019   Procedure: IR WITH ANESTHESIA;  Surgeon: Luanne Bras, MD;  Location: [facility];  Service: Radiology;  Laterality: N/A;    There were no vitals filed for this visit.   Subjective Assessment - 01/05/20 2055    Subjective Shoulder pain bothering me today.  Back pain and shoulder pain about a 2/10.  Injection scheduled for 10/11 and nerve block.  I think my walking has improved.    Pertinent History KGY:JEHUDJSH, hyperlipidemia, smoking, breast cancer, R carotid stenosis requiring emergent thrombectomy; 11/23/2019:  surgery to remove infected lymph node R groin    Patient Stated Goals Pt wants all the movement back in L side.    Currently in Pain? Yes    Pain Score 2     Pain Location Back    Pain Orientation Mid;Lower    Pain Descriptors / Indicators Aching    Pain Type Acute pain    Pain Onset In the past 7 days    Pain Frequency Intermittent    Aggravating Factors  walking too long    Pain Relieving Factors pain meds, heat  [facility] Adult PT Treatment/Exercise - 01/05/20 2056      Ambulation/Gait   Ambulation/Gait Yes    Ambulation/Gait Assistance 5: Supervision    Ambulation/Gait Assistance Details Gait outdoors, as gym area is too busy.  Performed 6MWT outdoors.  PT provides occasional cues for abdominal activation to maintain less lateral trunk lean.    Ambulation Distance (Feet) 1500 Feet    Gait Comments 6 MWT outdoors:  1115 ft (pain rated at 3/10 in low back).        Knee/Hip Exercises: Standing   Other Standing Knee Exercises Performed alternating heel raises at counter, 2 sets x 10 reps    Other Standing Knee Exercises Standing hip activation in tandem stance, 3 reps 10 seconds each foot position, intermittent UE support at counter. Braiding at counter, 3 reps with intermittent UE support.        Knee/Hip Exercises:  Sidelying   Hip ABduction Strengthening;Left;2 sets    Hip ABduction Limitations 1st set knee straight; 2nd set with knee bent.  PT provides assist for hip positioning to prevent rolling back/external rotation.                  PT Education - 01/05/20 2105    Education Details Verbally added back sidelying hip abduction (pt reports not doing in a while); explained rationale of hip strengthening and connection to improved gait; discussed POC/progress towards goals/possible trial of heelwedge to help offset L knee recurvatum and lateral trunk lean/dip through hip, but pt does not want to try due to L toes curling at times.    Person(s) Educated Patient    Methods Explanation;Demonstration;Verbal cues    Comprehension Verbalized understanding;Returned demonstration;Verbal cues required            PT Short Term Goals - 12/22/19 2041      PT SHORT TERM GOAL #1   Title Pt will be independent with progression of HEP for improved balance, strength, gait.  TARGET 3 weeks:  12/18/2019    Baseline 10/21/19: pt. verbalizes independence with HEP.    Time 3    Period Weeks    Status Achieved      PT SHORT TERM GOAL #2   Title Pt will improve 5x sit<>stand score to less than or equal to 10 seconds for improved functional strength.    Baseline 12/03/2019:  10.12 sec; 12/21/2019:  10.15 sec; 8.69 sec at best    Time 3    Period Weeks    Status Achieved      PT SHORT TERM GOAL #3   Title Pt will ambulate at least 4 ft/sec for improved ability to follow toddler grandchild with caregiving.    Baseline 3.58 ft/sec; 9/13:  9.43 sec = 3.47 ft/sec; fast: 8.31 sec = 3.95 ft/sec 12/21/2019    Time 3    Period Weeks    Status Partially Met             PT Long Term Goals - 01/05/20 2059      PT LONG TERM GOAL #1   Title Pt will verbalize plans for continued community fitness upon d/c from PT, to maximize gains made in PT.  TARGET 6 weeks, = 01/08/2020    Time 6    Period Weeks    Status New       PT LONG TERM GOAL #2   Title Pt will perform 6 MWT, at least 1250 ft, with no more than 2 point increase in pain for improved posture and  efficiency with gait.    Baseline 1200 ft, 4-5/10 pain.; 01/04/2020:  1115 ft outdoors, 2>3/10 pain with gait    Time 6    Period Weeks    Status Partially Met      PT LONG TERM GOAL #3   Title Pt will demo ability to jog 20 ft independently, for improved ability to help care for 2 yo grandchild.    Time 6    Period Weeks    Status New      PT LONG TERM GOAL #4   Title FOTO score to improve by at least 10 % for improved overall reported functional mobility.    Time 6    Period Weeks    Status New      PT LONG TERM GOAL #5   Title Pt will ambulate at least 1000 ft, outdoor and indoor surfaces, appropriate device as needed, modified independently, for improved community gait.    Baseline Not addressed due to heat today; pt does walk 1200 ft in 6 MWT; 1500 ft outdoors no device supervision 01/04/2020    Time 6    Period Weeks    Status Partially Met                 Plan - 01/05/20 2107    Clinical Impression Statement Began assessing LTGs this visit, with pt partially meeting 2 LTGs.  LTG 2 partially met for 6MWT (performed outside today due to busyness in gym; previously was assessed in gym), with pt having slightly decreased distance from previous, but only rates pain increase in 1 point.  STG 5 partially met for outdoor gait distance, with supervision and occasional postural cues.  Will likely recert to fully update HEP and work to transition to [employer], as it appears pt has stopped performing some of her previous HEP.    Personal Factors and Comorbidities Comorbidity 3+    Comorbidities PMH:  diabetes, hyperlipidemia, smoking, breast cancer, R carotid stenosis requiring emergent thrombectomy, anxiety depression    Examination-Activity Limitations Locomotion Level;Transfers;Reach Overhead;Stand;Stairs;Lift;Dressing     Examination-Participation Restrictions Community Activity;Other   caring for grandchild   Stability/Clinical Decision Making Evolving/Moderate complexity    Rehab Potential Good    PT Frequency Other (comment)   1x/wk (wk of 8/27) and then 2x/wk for 5 weeks   PT Duration 6 weeks   total POC = 6 weeks   PT Treatment/Interventions ADLs/Self Care Home Management;Electrical Stimulation;DME Instruction;Gait training;Stair training;Functional mobility training;Therapeutic activities;Therapeutic exercise;Balance training;Neuromuscular re-education;Manual techniques;Orthotic Fit/Training;Patient/family education;Passive range of motion;Aquatic Therapy    PT Next Visit Plan Check remaining LTGs (pt is scheduled out, but decide renew vs d/c); will need PN.  Need to fully update HEP and give pictures again (consider particularly sidelying hip abduction, core strengthening, tandem, braiding)    PT Home Exercise Plan Access Code: [email or web address]    Consulted and Agree with Plan of Care Patient           Patient will benefit from skilled therapeutic intervention in order to improve the following deficits and impairments:  Abnormal gait, Decreased coordination, Difficulty walking, Impaired tone, Impaired UE functional use, Decreased activity tolerance, Decreased balance, Decreased mobility, Decreased strength, Postural dysfunction  Visit Diagnosis: Other abnormalities of gait and mobility  Muscle weakness (generalized)  Abnormal posture     Problem List Patient Active Problem List   Diagnosis Date Noted  . Lymph node enlargement   . Cellulitis of groin, right   . Cellulitis   . Sepsis ([place]) 11/20/2019  .  Acute inguinal lymphadenitis   . Vascular headache   . Other chronic pain   . Uncontrolled type 2 diabetes mellitus with hyperglycemia ([place])   . Labile blood glucose   . Panic disorder with agoraphobia and moderate panic attacks   . Major depressive disorder, recurrent episode, moderate ([place])   .  MCI (mild cognitive impairment) with memory loss   . Acute ischemic right middle cerebral artery (MCA) stroke ([place]) 03/09/2019  . Left hemiparesis ([place])   . Bradycardia   . Tachypnea   . SIRS (systemic inflammatory response syndrome) (HCC)   . Hypokalemia   . Diabetes mellitus type 2 in obese ([place])   . Tobacco abuse   . History of breast cancer   . Cognitive deficits   . Dysphagia, post-stroke   . Stroke (cerebrum) ([place]) 03/02/2019  . Middle cerebral artery embolism, right 03/02/2019  . Chest pain 12/19/2015  . Numbness 12/19/2015  . Femoral hernia 03/24/2014  . Dyslipidemia 09/10/2011  . BRCA1 positive 09/10/2011  . Breast cancer ([place]) 09/10/2011  . H/O gastric bypass; 05/14/11([facility]) 09/10/2011  . Essential hypertension 10/28/2008  . DEPRESSION/ANXIETY 02/19/2007  . CARPAL TUNNEL SYNDROME, RIGHT 02/19/2007  . ALLERGIC RHINITIS 02/19/2007  . GERD 02/19/2007  . IRRITABLE BOWEL SYNDROME 02/19/2007  . HERPES GENITALIS 12/30/2006  . Diabetes ([place]) 12/30/2006  . OSTEOPENIA 12/30/2006  . ANOMALY, CONGENITAL, NERVOUS SYSTEM NEC 12/30/2006    Derel Mcglasson W. 01/05/2020, 9:13 PM  Frazier Butt., PT   [facility] [address] [place], [place], [address] Phone: [phone]   Fax:  [phone]  Name: Ann Lopez MRN: [id number] Date of Birth: 26-Jul-1970

## 2020-01-06 ENCOUNTER — Ambulatory Visit: Payer: Medicare Other | Admitting: Physical Therapy

## 2020-01-06 ENCOUNTER — Ambulatory Visit: Payer: Medicare Other | Admitting: Occupational Therapy

## 2020-01-06 ENCOUNTER — Other Ambulatory Visit: Payer: Self-pay

## 2020-01-06 ENCOUNTER — Encounter: Payer: Self-pay | Admitting: Occupational Therapy

## 2020-01-06 DIAGNOSIS — R29898 Other symptoms and signs involving the musculoskeletal system: Secondary | ICD-10-CM

## 2020-01-06 DIAGNOSIS — I69354 Hemiplegia and hemiparesis following cerebral infarction affecting left non-dominant side: Secondary | ICD-10-CM

## 2020-01-06 DIAGNOSIS — R2689 Other abnormalities of gait and mobility: Secondary | ICD-10-CM

## 2020-01-06 DIAGNOSIS — R2681 Unsteadiness on feet: Secondary | ICD-10-CM

## 2020-01-06 DIAGNOSIS — M6281 Muscle weakness (generalized): Secondary | ICD-10-CM

## 2020-01-06 DIAGNOSIS — R29818 Other symptoms and signs involving the nervous system: Secondary | ICD-10-CM

## 2020-01-06 DIAGNOSIS — M25512 Pain in left shoulder: Secondary | ICD-10-CM

## 2020-01-06 DIAGNOSIS — R278 Other lack of coordination: Secondary | ICD-10-CM

## 2020-01-06 DIAGNOSIS — R293 Abnormal posture: Secondary | ICD-10-CM

## 2020-01-06 NOTE — Therapy (Signed)
[facility] [address] [place], [place], [address] Phone: [phone]   Fax:  [phone]  Occupational Therapy Treatment and Progress Note  Patient Details  Name: Ann Lopez MRN: [id number] Date of Birth: Aug 08, 1970 Referring Provider (OT): Dr. Leeroy Cha  This progress note covers dates of service from 11/11/19-01/06/20  Encounter Date: 01/06/2020   OT End of Session - 01/06/20 1616    Visit Number 20    Number of Visits 25    Date for OT Re-Evaluation 02/15/20    Authorization Type [employer] Medicare/ Medicaid, covered 100% (medicare guidelines)    Authorization Time Period cert date 10/10/51-29/9/24    Authorization - Number of Visits 20    Progress Note Due on Visit 15    OT Start Time 1530    OT Stop Time 1610    OT Time Calculation (min) 40 min    Activity Tolerance Patient tolerated treatment well    Behavior During Therapy Flat affect           Past Medical History:  Diagnosis Date  . Allergy   . Anxiety   . Asthma   . Back pain   . Breast cancer ([place])   . Cancer ([place])   . Depression   . Diabetes mellitus   . Headache   . Hyperlipemia   . Osteopenia   . Osteoporosis    osteopenia  . Stroke [facility])     Past Surgical History:  Procedure Laterality Date  . ABDOMINAL HYSTERECTOMY     B oophorectomy for BRCA1 gene  . BREAST SURGERY    . CESAREAN SECTION    . INCISION AND DRAINAGE ABSCESS Left 11/14/2012   Procedure: INCISION AND DRAINAGE ABSCESS LEFT GROIN;  Surgeon: Jamesetta So, MD;  Location: [facility];  Service: General;  Laterality: Left;  . INGUINAL LYMPH NODE BIOPSY Right 11/23/2019   Procedure: INGUINAL LYMPH NODE BIOPSY;  Surgeon: Aviva Signs, MD;  Location: [facility];  Service: General;  Laterality: Right;  . IR ANGIO VERTEBRAL SEL SUBCLAVIAN INNOMINATE UNI R MOD SED  03/02/2019  . IR CT HEAD LTD  03/02/2019  . IR INTRAVSC STENT CERV CAROTID W/O EMB-PROT MOD SED INC ANGIO  03/02/2019   . IR PERCUTANEOUS ART THROMBECTOMY/INFUSION INTRACRANIAL INC DIAG ANGIO  03/02/2019  . LYMPHADENECTOMY    . MASTECTOMY Bilateral   . RADIOLOGY WITH ANESTHESIA N/A 03/02/2019   Procedure: IR WITH ANESTHESIA;  Surgeon: Luanne Bras, MD;  Location: [facility];  Service: Radiology;  Laterality: N/A;    There were no vitals filed for this visit.   Subjective Assessment - 01/06/20 1537    Subjective  Patient reports that she is stressed with raising her 48 year old.    Pertinent History R MCA  stroke with L shoulder pain.  PMH:  diabetes, anxiety, depression hyperlipidemia, smoking, breast cancer s/p bilateral mastectomy followed by chmo in 2009, R carotid stenosis requiring emergent thrombectomy    Limitations fall risk, pain    Patient Stated Goals improve L shoudler pain and ROM, be able to care for grandchild alone    Currently in Pain? Yes    Pain Score 2     Pain Location Shoulder    Pain Orientation Left    Pain Descriptors / Indicators Aching    Pain Type Acute pain    Pain Onset In the past 7 days    Pain Frequency Intermittent  OT Treatments/Exercises (OP) - 01/06/20 0001      ADLs   ADL Comments Patient reports feeling depressed and stressed specifically regarding raising her 16 yr old son.  Patient needed to call her oldest son today to help manage the teenagers behavior.  Discussed counseling - and patient thought she was ready to speak with a counselor.        Ultrasound   Ultrasound Location lateral humerus and posterior shoulder joint    Ultrasound Parameters 50% pulsed, 0.8 w/cm2, 3mhz, x 8min    Ultrasound Goals Pain      Manual Therapy   Manual Therapy Passive ROM    Scapular Mobilization In all planes to address scapular and GH joint mobility     Passive ROM In seated position, provided stabilization to left humerus and used body rotation over arm to improve range of motion toward external rotation.                        OT Short Term Goals - 01/06/20 1619      OT SHORT TERM GOAL #1   Title Pt will be independent with initial HEP.--check STGs 11/13/19 (extended due to scheduling)    Time 4    Period Weeks    Status Achieved      OT SHORT TERM GOAL #2   Title Pt will demo at least 70* L shoulder flexion for functional reaching without pain.    Baseline 40*    Time 4    Period Weeks    Status Achieved   10/29/19:  approx 90*     OT SHORT TERM GOAL #3   Title Pt will verbalize understanding of proper positioning of LUE for decr pain.    Time 4    Period Weeks    Status Achieved      OT SHORT TERM GOAL #4   Title Pt will be able to use LUE in simple light home maintenance tasks as non-dominant assist at least 50% of the time with pain less than 3/10 (wiping table, folding clothes, sweeping).    Time 4    Period Weeks    Status Achieved      OT SHORT TERM GOAL #5   Title Pt will demo improved LUE functional reaching for ADLs as shown by improving score on box and blocks test by at least 6.    Baseline 28 blocks    Time 4    Period Weeks    Status Achieved   36     OT SHORT TERM GOAL #6   Title Pt will verbalize understanding of adaptive strategies/AE to incr independence with cooking tasks.    Time 4    Period Weeks    Status Achieved             OT Long Term Goals - 01/06/20 1620      OT LONG TERM GOAL #1   Title Pt will be independent with updated HEP for LUE--check LTGs 11/24/19    Status On-going      OT LONG TERM GOAL #2   Title Pt will demo at least 90* L shoulder flex without pain for functional reaching.    Baseline 40* initally. 80* 12/15/19 seated.  90* supine 12/15/19    Time 8    Period Weeks    Status On-going      OT LONG TERM GOAL #3   Title Pt will be able to use LUE as nondominant   assist for functional tasks at least 75% of the time for light ADLs and IADLs with pain consistently less than or equal to 2/10.    Baseline less than 50%  01/06/20     Time 8    Period Weeks    Status On-going      OT LONG TERM GOAL #4   Title Pt will improve L grip strength by at least 8lbs to assist in opening containers/home maintenance tasks.    Baseline 42.7lbs initially, 46.7 12/15/19    Time 8    Period Weeks    Status On-going      OT LONG TERM GOAL #5   Title Pt will improve LUE coordination as shown by improving time on 9-hole peg test by at least 8sec.    Baseline 40.81sec initally, 38.78 12/15/19    Time 8    Period Weeks    Status On-going      OT LONG TERM GOAL #6   Title Pt will improve LUE functional reaching for ADLs as shown by improving score on box and blocks test by at least 10.    Baseline 28 initially, 39 - 12/15/19    Time 8    Period Weeks    Status Achieved                 Plan - 01/06/20 1618    Clinical Impression Statement Pt continues to be limited by shoulder pain, but overall pain is less (fluctuating between 2-4/10).  Pt reports upcoming nerve block.  Pt continues to be limited in ER and high range shoulder flex/abduction, immediate goal of therapy is to reduce or prevent increased pain in left shoulder and provide gentle passive range of motion.    OT Occupational Profile and History Detailed Assessment- Review of Records and additional review of physical, cognitive, psychosocial history related to current functional performance    Occupational performance deficits (Please refer to evaluation for details): IADL's;ADL's;Leisure    Body Structure / Function / Physical Skills ADL;Balance;IADL;ROM;Strength;Tone;FMC;Coordination;UE functional use;Decreased knowledge of precautions;GMC;Decreased knowledge of use of DME;Pain    Rehab Potential Good    Clinical Decision Making Several treatment options, min-mod task modification necessary    Comorbidities Affecting Occupational Performance: May have comorbidities impacting occupational performance    Modification or Assistance to Complete Evaluation  Min-Moderate  modification of tasks or assist with assess necessary to complete eval    OT Frequency 3x / week    OT Duration 8 weeks    OT Treatment/Interventions Self-care/ADL training;Moist Heat;DME and/or AE instruction;Therapeutic activities;Aquatic Therapy;Therapeutic exercise;Ultrasound;Passive range of motion;Functional Mobility Training;Neuromuscular education;Cryotherapy;Electrical Stimulation;Manual Therapy;Patient/family education    Plan ultrasound and manual prn, neuro re-ed, coordination HEP, functional reach with focus on shoulder positioning    OT Home Exercise Plan supine bilateral shoulder exercises    Consulted and Agree with Plan of Care Patient           Patient will benefit from skilled therapeutic intervention in order to improve the following deficits and impairments:   Body Structure / Function / Physical Skills: ADL, Balance, IADL, ROM, Strength, Tone, FMC, Coordination, UE functional use, Decreased knowledge of precautions, GMC, Decreased knowledge of use of DME, Pain       Visit Diagnosis: Hemiplegia and hemiparesis following cerebral infarction affecting left non-dominant side (HCC)  Acute pain of left shoulder  Other lack of coordination  Other symptoms and signs involving the nervous system  Unsteadiness on feet  Abnormal posture  Muscle weakness (generalized)    Other symptoms and signs involving the musculoskeletal system    Problem List Patient Active Problem List   Diagnosis Date Noted  . Lymph node enlargement   . Cellulitis of groin, right   . Cellulitis   . Sepsis ([place]) 11/20/2019  . Acute inguinal lymphadenitis   . Vascular headache   . Other chronic pain   . Uncontrolled type 2 diabetes mellitus with hyperglycemia ([place])   . Labile blood glucose   . Panic disorder with agoraphobia and moderate panic attacks   . Major depressive disorder, recurrent episode, moderate ([place])   . MCI (mild cognitive impairment) with memory loss   . Acute ischemic  right middle cerebral artery (MCA) stroke ([place]) 03/09/2019  . Left hemiparesis ([place])   . Bradycardia   . Tachypnea   . SIRS (systemic inflammatory response syndrome) ([facility])   . Hypokalemia   . Diabetes mellitus type 2 in obese ([place])   . Tobacco abuse   . History of breast cancer   . Cognitive deficits   . Dysphagia, post-stroke   . Stroke (cerebrum) ([place]) 03/02/2019  . Middle cerebral artery embolism, right 03/02/2019  . Chest pain 12/19/2015  . Numbness 12/19/2015  . Femoral hernia 03/24/2014  . Dyslipidemia 09/10/2011  . BRCA1 positive 09/10/2011  . Breast cancer ([place]) 09/10/2011  . H/O gastric bypass; 05/14/11([facility]) 09/10/2011  . Essential hypertension 10/28/2008  . DEPRESSION/ANXIETY 02/19/2007  . CARPAL TUNNEL SYNDROME, RIGHT 02/19/2007  . ALLERGIC RHINITIS 02/19/2007  . GERD 02/19/2007  . IRRITABLE BOWEL SYNDROME 02/19/2007  . HERPES GENITALIS 12/30/2006  . Diabetes ([place]) 12/30/2006  . OSTEOPENIA 12/30/2006  . ANOMALY, CONGENITAL, NERVOUS SYSTEM NEC 12/30/2006    Mariah Milling, OTR/L 01/06/2020, 5:39 PM  [facility] [address] [place], [place], [address] Phone: [phone]   Fax:  [phone]  Name: ARYIA DELIRA MRN: [id number] Date of Birth: [birth year]

## 2020-01-06 NOTE — Patient Instructions (Signed)
Access Code: [email or web address] URL: [email or web address] Date: 01/06/2020 Prepared by: Mady Haagensen  Exercises Side Stepping with Resistance at Thighs and Counter Support - 1 x daily - 5 x weekly - 1 sets - 3 reps Tandem Walking with Counter Support - 1 x daily - 5 x weekly - 1 sets - 3 reps Standing Single Leg Stance with Counter Support - 1 x daily - 5 x weekly - 1 sets - 3 reps - 20 hold Step Up - 1 x daily - 5 x weekly - 1 sets - 10 reps Wall Quarter Squat - 1 x daily - 5 x weekly - 2-3 sets - 10 reps Heel Toe Raises with Counter Support - 1 x daily - 7 x weekly - 2-3 sets - 10 reps Standing Tandem Balance with Counter Support - 1 x daily - 7 x weekly - 3 sets - 10 reps C[place] with Counter Support - 1 x daily - 7 x weekly - 3 sets - 10 reps Sidelying Hip Abduction - 1 x daily - 5 x weekly - 2 sets - 10 reps

## 2020-01-07 NOTE — Addendum Note (Signed)
Addended by: Frazier Butt on: 01/07/2020 10:23 PM   Modules accepted: Orders

## 2020-01-07 NOTE — Therapy (Signed)
[facility] [address] [place], [place], [address] Phone: [phone]   Fax:  [phone]  Physical Therapy Treatment/Progress Note/Recert  Patient Details  Name: Ann Lopez MRN: [id number] Date of Birth: 06-Nov-1970 Referring Provider (PT): Dr. Leeroy Cha   Encounter Date: 01/06/2020   PT End of Session - 01/07/20 2132    Visit Number 33    Number of Visits 41    Date for PT Re-Evaluation 92/92/44   60 day recert for 4 wk POC   Authorization Type [facility] Medicare/Medicaid    Progress Note Due on Visit 43   PN completed 01/06/2020, visit 39   PT Start Time 1446    PT Stop Time 1532    PT Time Calculation (min) 46 min    Activity Tolerance Patient tolerated treatment well;Patient limited by pain   3/10 back pain at end of session   Behavior During Therapy [facility] for tasks assessed/performed           Past Medical History:  Diagnosis Date  . Allergy   . Anxiety   . Asthma   . Back pain   . Breast cancer ([place])   . Cancer ([place])   . Depression   . Diabetes mellitus   . Headache   . Hyperlipemia   . Osteopenia   . Osteoporosis    osteopenia  . Stroke [facility])     Past Surgical History:  Procedure Laterality Date  . ABDOMINAL HYSTERECTOMY     B oophorectomy for BRCA1 gene  . BREAST SURGERY    . CESAREAN SECTION    . INCISION AND DRAINAGE ABSCESS Left 11/14/2012   Procedure: INCISION AND DRAINAGE ABSCESS LEFT GROIN;  Surgeon: Jamesetta So, MD;  Location: [facility];  Service: General;  Laterality: Left;  . INGUINAL LYMPH NODE BIOPSY Right 11/23/2019   Procedure: INGUINAL LYMPH NODE BIOPSY;  Surgeon: Aviva Signs, MD;  Location: [facility];  Service: General;  Laterality: Right;  . IR ANGIO VERTEBRAL SEL SUBCLAVIAN INNOMINATE UNI R MOD SED  03/02/2019  . IR CT HEAD LTD  03/02/2019  . IR INTRAVSC STENT CERV CAROTID W/O EMB-PROT MOD SED INC ANGIO  03/02/2019  . IR PERCUTANEOUS ART THROMBECTOMY/INFUSION INTRACRANIAL  INC DIAG ANGIO  03/02/2019  . LYMPHADENECTOMY    . MASTECTOMY Bilateral   . RADIOLOGY WITH ANESTHESIA N/A 03/02/2019   Procedure: IR WITH ANESTHESIA;  Surgeon: Luanne Bras, MD;  Location: [facility];  Service: Radiology;  Laterality: N/A;    There were no vitals filed for this visit.   Subjective Assessment - 01/06/20 1449    Subjective Shoulder pain bothering me today.    Pertinent History QKM:[id number], hyperlipidemia, smoking, breast cancer, R carotid stenosis requiring emergent thrombectomy; 11/23/2019:  surgery to remove infected lymph node R groin    Patient Stated Goals Pt wants all the movement back in L side.    Currently in Pain? Yes    Pain Score 2     Pain Location Shoulder   Back   Pain Orientation Left    Pain Descriptors / Indicators Aching    Pain Type Acute pain    Pain Onset In the past 7 days    Pain Frequency Intermittent    Aggravating Factors  walking too long aggravates back    Pain Relieving Factors pain meds, heat help  [facility] Adult PT Treatment/Exercise - 01/06/20 1446      Self-Care   Self-Care Other Self-Care Comments    Other Self-Care Comments  Helped pt complete FOTO assessment questions, with pts FOTO score 53%, improved from 40% at eval.  Rates walking one block limited a little.         Discussed pt's improvements on self-rated FOTO score since eval.  Pt reports at times feeling depressed, and she has a counselor that she can see and PT encouraged her to contact them.  Discussed POC and recert for 4 additional weeks.  Gait velocity:  8.65 sec (3.79 ft/sec). (after several attempts)  Worked on short distance gait walking fast and walking slow, change of speeds.  Cues to relax through shoulders and maintain upright posture.  Reviewed HEP/updated  Side Stepping with Resistance at Thighs and Counter Support - 1 x daily - 5 x weekly - 1 sets - 3 reps (demonstrated without band, with cues for upright  posture and staying tall through L hip) Tandem Walking with Counter Support - 1 x daily - 5 x weekly - 1 sets - 3 reps (demo at counter light support, cues for slowed pace and visual cues to look ahead)  Progressed to tandem march along counter forward and back x 2 reps. Standing Single Leg Stance with Counter Support - 1 x daily - 5 x weekly - 1 sets - 3 reps - 20 hold  (attempted no UE support, but pt needs UE support).. Tactile cues provided through L hip and trunk to remain more upright Step Up - 1 x daily - 5 x weekly - 1 sets - 10 reps (demo forward and demo side step ups, forward step downs x 10 reps each -cues to relax shoulder posture) Wall Quarter Squat - 1 x daily - 5 x weekly - 2-3 sets - 10 reps (return demo understanding) Heel Toe Raises with Counter Support - 1 x daily - 7 x weekly - 2-3 sets - 10 reps (return demo understanding)  Performed and added to HEP: Standing Tandem Balance with Counter Support - 1 x daily - 7 x weekly - 3 sets - 10 reps  Carioca with Counter Support - 1 x daily - 7 x weekly - 3 sets - 10 reps Sidelying Hip Abduction - 1 x daily - 5 x weekly - 2 sets - 10 reps (knee bent)         PT Education - 01/07/20 2131    Education Details Updates to HEP; progress towards goals; POC    Person(s) Educated Patient    Methods Explanation;Demonstration;Handout    Comprehension Verbalized understanding;Returned demonstration;Verbal cues required            PT Short Term Goals - 12/22/19 2041      PT SHORT TERM GOAL #1   Title Pt will be independent with progression of HEP for improved balance, strength, gait.  TARGET 3 weeks:  12/18/2019    Baseline 10/21/19: pt. verbalizes independence with HEP.    Time 3    Period Weeks    Status Achieved      PT SHORT TERM GOAL #2   Title Pt will improve 5x sit<>stand score to less than or equal to 10 seconds for improved functional strength.    Baseline 12/03/2019:  10.12 sec; 12/21/2019:  10.15 sec; 8.69 sec at best     Time 3    Period Weeks    Status Achieved      PT  SHORT TERM GOAL #3   Title Pt will ambulate at least 4 ft/sec for improved ability to follow toddler grandchild with caregiving.    Baseline 3.58 ft/sec; 9/13:  9.43 sec = 3.47 ft/sec; fast: 8.31 sec = 3.95 ft/sec 12/21/2019    Time 3    Period Weeks    Status Partially Met             PT Long Term Goals - 01/07/20 2139      PT LONG TERM GOAL #1   Title Pt will verbalize plans for continued community fitness upon d/c from PT, to maximize gains made in PT.  TARGET 6 weeks, = 01/08/2020    Baseline Have initiated conversation on community fitness; pt has not followed up    Time 6    Period Weeks    Status Not Met      PT LONG TERM GOAL #2   Title Pt will perform 6 MWT, at least 1250 ft, with no more than 2 point increase in pain for improved posture and efficiency with gait.    Baseline 1200 ft, 4-5/10 pain.; 01/04/2020:  1115 ft outdoors, 2>3/10 pain with gait    Time 6    Period Weeks    Status Partially Met      PT LONG TERM GOAL #3   Title Pt will demo ability to jog 20 ft independently, for improved ability to help care for 2 yo grandchild.    Time 6    Period Weeks    Status Not Met      PT LONG TERM GOAL #4   Title FOTO score to improve by at least 10 % for improved overall reported functional mobility.    Baseline 40 at eval, 53% 01/06/2020    Time 6    Period Weeks    Status Achieved      PT LONG TERM GOAL #5   Title Pt will ambulate at least 1000 ft, outdoor and indoor surfaces, appropriate device as needed, modified independently, for improved community gait.    Baseline Not addressed due to heat today; pt does walk 1200 ft in 6 MWT; 1500 ft outdoors no device supervision 01/04/2020    Time 6    Period Weeks    Status Partially Met          New goals for recert:   PT Long Term Goals - 01/07/20 2213      PT LONG TERM GOAL #1   Title Pt will verbalize plans for continued community fitness upon d/c from PT,  to maximize gains made in PT.  TARGET 02/05/2020    Baseline Have initiated conversation on community fitness; pt has not followed up    Time 4    Period Weeks    Status On-going      PT LONG TERM GOAL #2   Title Pt will perform 6 MWT, at least 1250 ft, with no more than 2 point increase in pain for improved posture and efficiency with gait.    Baseline 1200 ft, 4-5/10 pain.; 01/04/2020:  1115 ft outdoors, 2>3/10 pain with gait    Time 4    Period Weeks    Status On-going      PT LONG TERM GOAL #3   Title Pt will improve gait velocity to 4 ft/sec for improved gait speed to help care for grandchild.    Baseline 3.79 ft/sec    Time 4    Period Weeks    Status Revised  PT LONG TERM GOAL #4   Title FOTO score to improve to at least 60 % for improved overall reported functional mobility.    Baseline 40 at eval, 53% 01/06/2020    Time 4    Period Weeks    Status Revised      PT LONG TERM GOAL #5   Title --    Baseline --    Time --    Period --    Status --                  Plan - 01/07/20 2154    Clinical Impression Statement 10th visit progress note, covering dates 12/03/2019-01/06/2020: Pt feels like her walking is better.  Pt does report that she sometimes is depressed about not moving as well as she would like.  Pt rates pain as 3/10 with gait >1000 ft on outdoor surfaces, gait velocity 3.79 ft/sec.  6MWT 1115 ft outdoors.  Assessed remaining LTGs, with LTG not yet met for ongoing community fitness, LTG 3 not met for jogging (not attempted due to back pain); LTG 4 met for improved FOTO score.  Pt appears to have overall less trunk sway and lateral lean with gait; however, with challenging activities and with fatigue, trunk sway/lateral lean is noted more.  She will benefit from additional short course of therapy to finalize HEP, make final community fitness recommendations, continueing to address gait, posture/trunk control and LLE strengthening for improved dynamic gait.     Personal Factors and Comorbidities Comorbidity 3+    Comorbidities PMH:  diabetes, hyperlipidemia, smoking, breast cancer, R carotid stenosis requiring emergent thrombectomy, anxiety depression    Examination-Activity Limitations Locomotion Level;Transfers;Reach Overhead;Stand;Stairs;Lift;Dressing    Examination-Participation Restrictions Community Activity;Other   caring for grandchild   Stability/Clinical Decision Making Evolving/Moderate complexity    Rehab Potential Good    PT Frequency 2x / week    PT Duration 4 weeks   per recert 3/78/5885   PT Treatment/Interventions ADLs/Self Care Home Management;Electrical Stimulation;DME Instruction;Gait training;Stair training;Functional mobility training;Therapeutic activities;Therapeutic exercise;Balance training;Neuromuscular re-education;Manual techniques;Orthotic Fit/Training;Patient/family education;Passive range of motion;Aquatic Therapy    PT Next Visit Plan PN completed today.  Review updates and additions to HEP;  continue to work on L hip strength, core stability/strength and gait training; work on changing speeds    PT Home Exercise Plan Access Code: [email or web address]    Consulted and Agree with Plan of Care Patient           Patient will benefit from skilled therapeutic intervention in order to improve the following deficits and impairments:  Abnormal gait, Decreased coordination, Difficulty walking, Impaired tone, Impaired UE functional use, Decreased activity tolerance, Decreased balance, Decreased mobility, Decreased strength, Postural dysfunction  Visit Diagnosis: Other abnormalities of gait and mobility  Muscle weakness (generalized)  Unsteadiness on feet  Abnormal posture     Problem List Patient Active Problem List   Diagnosis Date Noted  . Lymph node enlargement   . Cellulitis of groin, right   . Cellulitis   . Sepsis ([place]) 11/20/2019  . Acute inguinal lymphadenitis   . Vascular headache   . Other chronic pain   .  Uncontrolled type 2 diabetes mellitus with hyperglycemia ([place])   . Labile blood glucose   . Panic disorder with agoraphobia and moderate panic attacks   . Major depressive disorder, recurrent episode, moderate ([place])   . MCI (mild cognitive impairment) with memory loss   . Acute ischemic right middle cerebral artery (MCA) stroke ([place]) 03/09/2019  .  Left hemiparesis ([place])   . Bradycardia   . Tachypnea   . SIRS (systemic inflammatory response syndrome) ([facility])   . Hypokalemia   . Diabetes mellitus type 2 in obese ([place])   . Tobacco abuse   . History of breast cancer   . Cognitive deficits   . Dysphagia, post-stroke   . Stroke (cerebrum) ([place]) 03/02/2019  . Middle cerebral artery embolism, right 03/02/2019  . Chest pain 12/19/2015  . Numbness 12/19/2015  . Femoral hernia 03/24/2014  . Dyslipidemia 09/10/2011  . BRCA1 positive 09/10/2011  . Breast cancer ([place]) 09/10/2011  . H/O gastric bypass; 05/14/11([facility]) 09/10/2011  . Essential hypertension 10/28/2008  . DEPRESSION/ANXIETY 02/19/2007  . CARPAL TUNNEL SYNDROME, RIGHT 02/19/2007  . ALLERGIC RHINITIS 02/19/2007  . GERD 02/19/2007  . IRRITABLE BOWEL SYNDROME 02/19/2007  . HERPES GENITALIS 12/30/2006  . Diabetes ([place]) 12/30/2006  . OSTEOPENIA 12/30/2006  . ANOMALY, CONGENITAL, NERVOUS SYSTEM NEC 12/30/2006    Dryden Tapley W. 01/07/2020, 10:03 PM  Frazier Butt., PT   [facility] [address] [place], [place], [address] Phone: [phone]   Fax:  [phone]  Name: Ann Lopez MRN: [id number] Date of Birth: [birth year]

## 2020-01-12 ENCOUNTER — Encounter: Payer: Self-pay | Admitting: Occupational Therapy

## 2020-01-12 ENCOUNTER — Other Ambulatory Visit: Payer: Self-pay

## 2020-01-12 ENCOUNTER — Ambulatory Visit: Payer: Medicare Other | Attending: Physical Medicine and Rehabilitation | Admitting: Physical Therapy

## 2020-01-12 ENCOUNTER — Ambulatory Visit: Payer: Medicare Other | Admitting: Occupational Therapy

## 2020-01-12 ENCOUNTER — Other Ambulatory Visit: Payer: Self-pay | Admitting: Physical Medicine and Rehabilitation

## 2020-01-12 DIAGNOSIS — R293 Abnormal posture: Secondary | ICD-10-CM | POA: Insufficient documentation

## 2020-01-12 DIAGNOSIS — I69354 Hemiplegia and hemiparesis following cerebral infarction affecting left non-dominant side: Secondary | ICD-10-CM | POA: Insufficient documentation

## 2020-01-12 DIAGNOSIS — M25512 Pain in left shoulder: Secondary | ICD-10-CM | POA: Insufficient documentation

## 2020-01-12 DIAGNOSIS — M6281 Muscle weakness (generalized): Secondary | ICD-10-CM | POA: Diagnosis not present

## 2020-01-12 DIAGNOSIS — R29898 Other symptoms and signs involving the musculoskeletal system: Secondary | ICD-10-CM | POA: Insufficient documentation

## 2020-01-12 DIAGNOSIS — R29818 Other symptoms and signs involving the nervous system: Secondary | ICD-10-CM

## 2020-01-12 DIAGNOSIS — R2689 Other abnormalities of gait and mobility: Secondary | ICD-10-CM | POA: Insufficient documentation

## 2020-01-12 DIAGNOSIS — R278 Other lack of coordination: Secondary | ICD-10-CM

## 2020-01-12 DIAGNOSIS — R2681 Unsteadiness on feet: Secondary | ICD-10-CM | POA: Insufficient documentation

## 2020-01-12 NOTE — Therapy (Signed)
[facility] [address] [place], [place], [address] Phone: [phone]   Fax:  [phone]  Occupational Therapy Treatment  Patient Details  Name: Ann Lopez MRN: [id number] Date of Birth: Dec 13, 1970 Referring Provider (OT): Dr. Leeroy Cha   Encounter Date: 01/12/2020   OT End of Session - 01/12/20 1237    Visit Number 21    Number of Visits 33    Date for OT Re-Evaluation 02/15/20    Authorization Type [employer] Medicare/ Medicaid, covered 100% (medicare guidelines)    Authorization Time Period cert date 4/0/37-12/13/41    Authorization - Number of Visits 21    Progress Note Due on Visit 30    OT Start Time 1150    OT Stop Time 1230    OT Time Calculation (min) 40 min    Activity Tolerance Patient tolerated treatment well    Behavior During Therapy [facility] for tasks assessed/performed           Past Medical History:  Diagnosis Date  . Allergy   . Anxiety   . Asthma   . Back pain   . Breast cancer ([place])   . Cancer ([place])   . Depression   . Diabetes mellitus   . Headache   . Hyperlipemia   . Osteopenia   . Osteoporosis    osteopenia  . Stroke [facility])     Past Surgical History:  Procedure Laterality Date  . ABDOMINAL HYSTERECTOMY     B oophorectomy for BRCA1 gene  . BREAST SURGERY    . CESAREAN SECTION    . INCISION AND DRAINAGE ABSCESS Left 11/14/2012   Procedure: INCISION AND DRAINAGE ABSCESS LEFT GROIN;  Surgeon: Jamesetta So, MD;  Location: [facility];  Service: General;  Laterality: Left;  . INGUINAL LYMPH NODE BIOPSY Right 11/23/2019   Procedure: INGUINAL LYMPH NODE BIOPSY;  Surgeon: Aviva Signs, MD;  Location: [facility];  Service: General;  Laterality: Right;  . IR ANGIO VERTEBRAL SEL SUBCLAVIAN INNOMINATE UNI R MOD SED  03/02/2019  . IR CT HEAD LTD  03/02/2019  . IR INTRAVSC STENT CERV CAROTID W/O EMB-PROT MOD SED INC ANGIO  03/02/2019  . IR PERCUTANEOUS ART THROMBECTOMY/INFUSION INTRACRANIAL INC  DIAG ANGIO  03/02/2019  . LYMPHADENECTOMY    . MASTECTOMY Bilateral   . RADIOLOGY WITH ANESTHESIA N/A 03/02/2019   Procedure: IR WITH ANESTHESIA;  Surgeon: Luanne Bras, MD;  Location: [facility];  Service: Radiology;  Laterality: N/A;    There were no vitals filed for this visit.   Subjective Assessment - 01/12/20 1154    Subjective  Patient reports that things are better with her son.    Pertinent History R MCA  stroke with L shoulder pain.  PMH:  diabetes, anxiety, depression hyperlipidemia, smoking, breast cancer s/p bilateral mastectomy followed by chmo in 2009, R carotid stenosis requiring emergent thrombectomy    Limitations fall risk, pain    Patient Stated Goals improve L shoudler pain and ROM, be able to care for grandchild alone    Currently in Pain? Yes    Pain Score 3     Pain Location Shoulder    Pain Orientation Left    Pain Descriptors / Indicators Aching    Pain Type Acute pain    Pain Onset 1 to 4 weeks ago    Pain Frequency Intermittent    Aggravating Factors  not sure    Pain Relieving Factors Pain meds heat  OT Treatments/Exercises (OP) - 01/12/20 0001      Neurological Re-education Exercises   Other Exercises 1 Patient to receive nerve block next week - working to prevent further increase in shoulder pain until this intervention.  Patient able to tolerate improved [facility] External rotation this session.  Worked on seated movement of body on arm with arms on table top and flexing trunk away to promote greater flexion in [facility] joint.        Ultrasound   Ultrasound Location Lateral humerus superior shoulder joint    Ultrasound Parameters 50% pulsed, 0.8 w/cm2, 44mn    Ultrasound Goals Pain      Manual Therapy   Scapular Mobilization In all planes to address scapular and GDenisonjoint mobility                     OT Short Term Goals - 01/06/20 1619      OT SHORT TERM GOAL #1   Title Pt will be independent with initial  HEP.--check STGs 11/13/19 (extended due to scheduling)    Time 4    Period Weeks    Status Achieved      OT SHORT TERM GOAL #2   Title Pt will demo at least 70* L shoulder flexion for functional reaching without pain.    Baseline 40*    Time 4    Period Weeks    Status Achieved   10/29/19:  approx 90*     OT SHORT TERM GOAL #3   Title Pt will verbalize understanding of proper positioning of LUE for decr pain.    Time 4    Period Weeks    Status Achieved      OT SHORT TERM GOAL #4   Title Pt will be able to use LUE in simple light home maintenance tasks as non-dominant assist at least 50% of the time with pain less than 3/10 (wiping table, folding clothes, sweeping).    Time 4    Period Weeks    Status Achieved      OT SHORT TERM GOAL #5   Title Pt will demo improved LUE functional reaching for ADLs as shown by improving score on box and blocks test by at least 6.    Baseline 28 blocks    Time 4    Period Weeks    Status Achieved   36     OT SHORT TERM GOAL #6   Title Pt will verbalize understanding of adaptive strategies/AE to incr independence with cooking tasks.    Time 4    Period Weeks    Status Achieved             OT Long Term Goals - 01/06/20 1620      OT LONG TERM GOAL #1   Title Pt will be independent with updated HEP for LUE--check LTGs 11/24/19    Status On-going      OT LONG TERM GOAL #2   Title Pt will demo at least 90* L shoulder flex without pain for functional reaching.    Baseline 40* initally. 80* 12/15/19 seated.  90* supine 12/15/19    Time 8    Period Weeks    Status On-going      OT LONG TERM GOAL #3   Title Pt will be able to use LUE as nondominant assist for functional tasks at least 75% of the time for light ADLs and IADLs with pain consistently less than or equal to 2/10.  Baseline less than 50%  01/06/20    Time 8    Period Weeks    Status On-going      OT LONG TERM GOAL #4   Title Pt will improve L grip strength by at least 8lbs to  assist in opening containers/home maintenance tasks.    Baseline 42.7lbs initially, 46.7 12/15/19    Time 8    Period Weeks    Status On-going      OT LONG TERM GOAL #5   Title Pt will improve LUE coordination as shown by improving time on 9-hole peg test by at least 8sec.    Baseline 40.81sec initally, 38.78 12/15/19    Time 8    Period Weeks    Status On-going      OT LONG TERM GOAL #6   Title Pt will improve LUE functional reaching for ADLs as shown by improving score on box and blocks test by at least 10.    Baseline 28 initially, 39 - 12/15/19    Time 8    Period Weeks    Status Achieved                 Plan - 01/12/20 1239    Clinical Impression Statement Patient holding steady with shoulder pain, eagerly awaiting nerve block next week.    OT Occupational Profile and History Detailed Assessment- Review of Records and additional review of physical, cognitive, psychosocial history related to current functional performance    Occupational performance deficits (Please refer to evaluation for details): IADL's;ADL's;Leisure    Body Structure / Function / Physical Skills ADL;Balance;IADL;ROM;Strength;Tone;FMC;Coordination;UE functional use;Decreased knowledge of precautions;GMC;Decreased knowledge of use of DME;Pain    Rehab Potential Good    Clinical Decision Making Several treatment options, min-mod task modification necessary    Comorbidities Affecting Occupational Performance: May have comorbidities impacting occupational performance    Modification or Assistance to Complete Evaluation  Min-Moderate modification of tasks or assist with assess necessary to complete eval    OT Frequency 3x / week    OT Duration 8 weeks    OT Treatment/Interventions Self-care/ADL training;Moist Heat;DME and/or AE instruction;Therapeutic activities;Aquatic Therapy;Therapeutic exercise;Ultrasound;Passive range of motion;Functional Mobility Training;Neuromuscular education;Cryotherapy;Electrical  Stimulation;Manual Therapy;Patient/family education    Plan ultrasound and manual prn, neuro re-ed, coordination HEP, functional reach with focus on shoulder positioning, add seated HEP body on arm arm on table    OT Home Exercise Plan supine bilateral shoulder exercises    Consulted and Agree with Plan of Care Patient           Patient will benefit from skilled therapeutic intervention in order to improve the following deficits and impairments:   Body Structure / Function / Physical Skills: ADL, Balance, IADL, ROM, Strength, Tone, FMC, Coordination, UE functional use, Decreased knowledge of precautions, GMC, Decreased knowledge of use of DME, Pain       Visit Diagnosis: Hemiplegia and hemiparesis following cerebral infarction affecting left non-dominant side (HCC)  Abnormal posture  Unsteadiness on feet  Acute pain of left shoulder  Other lack of coordination  Other symptoms and signs involving the nervous system  Other symptoms and signs involving the musculoskeletal system    Problem List Patient Active Problem List   Diagnosis Date Noted  . Lymph node enlargement   . Cellulitis of groin, right   . Cellulitis   . Sepsis ([facility]CC) 11/20/2019  . Acute inguinal lymphadenitis   . Vascular headache   . Other chronic pain   . Uncontrolled type 2  diabetes mellitus with hyperglycemia ([place])   . Labile blood glucose   . Panic disorder with agoraphobia and moderate panic attacks   . Major depressive disorder, recurrent episode, moderate ([place])   . MCI (mild cognitive impairment) with memory loss   . Acute ischemic right middle cerebral artery (MCA) stroke ([place]) 03/09/2019  . Left hemiparesis ([place])   . Bradycardia   . Tachypnea   . SIRS (systemic inflammatory response syndrome) (HCC)   . Hypokalemia   . Diabetes mellitus type 2 in obese ([place])   . Tobacco abuse   . History of breast cancer   . Cognitive deficits   . Dysphagia, post-stroke   . Stroke (cerebrum) ([place])  03/02/2019  . Middle cerebral artery embolism, right 03/02/2019  . Chest pain 12/19/2015  . Numbness 12/19/2015  . Femoral hernia 03/24/2014  . Dyslipidemia 09/10/2011  . BRCA1 positive 09/10/2011  . Breast cancer ([place]) 09/10/2011  . H/O gastric bypass; 05/14/11([facility]) 09/10/2011  . Essential hypertension 10/28/2008  . DEPRESSION/ANXIETY 02/19/2007  . CARPAL TUNNEL SYNDROME, RIGHT 02/19/2007  . ALLERGIC RHINITIS 02/19/2007  . GERD 02/19/2007  . IRRITABLE BOWEL SYNDROME 02/19/2007  . HERPES GENITALIS 12/30/2006  . Diabetes ([place]) 12/30/2006  . OSTEOPENIA 12/30/2006  . ANOMALY, CONGENITAL, NERVOUS SYSTEM NEC 12/30/2006    Mariah Milling, OTR/L 01/12/2020, 12:41 PM  [facility] [address] [place], [place], [address] Phone: [phone]   Fax:  [phone]  Name: POSIE LILLIBRIDGE MRN: [id number] Date of Birth: 04-06-1971

## 2020-01-13 NOTE — Therapy (Signed)
[facility] [address] [place], [place], [address] Phone: [phone]   Fax:  [phone]  Physical Therapy Treatment  Patient Details  Name: Ann Lopez MRN: [id number] Date of Birth: July 12, 1970 Referring Provider (PT): Dr. Leeroy Cha   Encounter Date: 01/12/2020   PT End of Session - 01/13/20 1538    Visit Number 34    Number of Visits 41    Date for PT Re-Evaluation 58/85/02   60 day recert for 4 wk POC   Authorization Type [facility] Medicare/Medicaid    Progress Note Due on Visit 43   PN completed 01/06/2020, visit 64   PT Start Time 1103    PT Stop Time 1144    PT Time Calculation (min) 41 min    Activity Tolerance Patient tolerated treatment well;Patient limited by pain   3/10 back pain at end of session   Behavior During Therapy [facility] for tasks assessed/performed           Past Medical History:  Diagnosis Date  . Allergy   . Anxiety   . Asthma   . Back pain   . Breast cancer ([place])   . Cancer ([place])   . Depression   . Diabetes mellitus   . Headache   . Hyperlipemia   . Osteopenia   . Osteoporosis    osteopenia  . Stroke [facility])     Past Surgical History:  Procedure Laterality Date  . ABDOMINAL HYSTERECTOMY     B oophorectomy for BRCA1 gene  . BREAST SURGERY    . CESAREAN SECTION    . INCISION AND DRAINAGE ABSCESS Left 11/14/2012   Procedure: INCISION AND DRAINAGE ABSCESS LEFT GROIN;  Surgeon: Jamesetta So, MD;  Location: [facility];  Service: General;  Laterality: Left;  . INGUINAL LYMPH NODE BIOPSY Right 11/23/2019   Procedure: INGUINAL LYMPH NODE BIOPSY;  Surgeon: Aviva Signs, MD;  Location: [facility];  Service: General;  Laterality: Right;  . IR ANGIO VERTEBRAL SEL SUBCLAVIAN INNOMINATE UNI R MOD SED  03/02/2019  . IR CT HEAD LTD  03/02/2019  . IR INTRAVSC STENT CERV CAROTID W/O EMB-PROT MOD SED INC ANGIO  03/02/2019  . IR PERCUTANEOUS ART THROMBECTOMY/INFUSION INTRACRANIAL INC DIAG ANGIO   03/02/2019  . LYMPHADENECTOMY    . MASTECTOMY Bilateral   . RADIOLOGY WITH ANESTHESIA N/A 03/02/2019   Procedure: IR WITH ANESTHESIA;  Surgeon: Luanne Bras, MD;  Location: [place];  Service: Radiology;  Laterality: N/A;    There were no vitals filed for this visit.   Subjective Assessment - 01/12/20 1105    Subjective Nothing new going on.    Pertinent History DXA:JOINOMVE, hyperlipidemia, smoking, breast cancer, R carotid stenosis requiring emergent thrombectomy; 11/23/2019:  surgery to remove infected lymph node R groin    Patient Stated Goals Pt wants all the movement back in L side.    Currently in Pain? Yes    Pain Score 4     Pain Location Shoulder    Pain Orientation Left    Pain Descriptors / Indicators Aching    Pain Onset In the past 7 days    Pain Frequency Intermittent    Aggravating Factors  not sure    Pain Relieving Factors pain meds, heat    Multiple Pain Sites Yes    Pain Score 3    Pain Location Back    Pain Orientation Right;Left;Lower    Pain Descriptors / Indicators Aching    Pain Frequency Intermittent  Aggravating Factors  may have been the way I slept; worse when I work up    Pain Relieving Factors unsure                             [facility] Adult PT Treatment/Exercise - 01/13/20 0001      Self-Care   Self-Care Other Self-Care Comments    Other Self-Care Comments  Discussed pt's toe curling, likely dystonia/increased tone related, with PT educating pt about ways to try to manage (toe separators, toe socks) and to speak to MD regarding tone/pain      Therapeutic Activites    Therapeutic Activities Other Therapeutic Activities    Other Therapeutic Activities Floor ladder negotiation-forward walk, forward march, step out/out, in/in with L foot leading through, then with R foot leading through.  On solid ground (no floor ladder),       Neuro Re-ed    Neuro Re-ed Details  Tandem gait, tandem march performed along counter, cues for  upright posture and abdominal activation, 4 reps each.  Progressed to performing 4 reps each along red mat surface with min guard through hips.        Exercises   Other Exercises  STanding toe stretch (going up on toes) in runner's stretch poistion, 3 reps 10 sec.  Pt c/o pain due to toes curling with standing and gait.  In sitting with shoes off, worked on passive stretch into plantarflexion, into dorsiflexion and toe extension, x 5 reps, with 15-30 sec hold.  Cues to stretch full foot into dorsiflexion.  Seated heel raise stretch to press weight through toes for stretch.  Verbal/demo spreading toes, use of toe socks or toe separators.            Review of HEP from last visit:  Standing Tandem Balance with Counter Support - 1 x daily - 7 x weekly - 3 sets - 10 reps [place] with Counter Support - 1 x daily - 7 x weekly - 3 sets - 10 reps Sidelying Hip Abduction - 1 x daily - 5 x weekly - 2 sets - 10 reps (with bent knee)  Progressed to straight leg hip abduction sidelying lift, 2 sets x 10 reps         PT Short Term Goals - 12/22/19 2041      PT SHORT TERM GOAL #1   Title Pt will be independent with progression of HEP for improved balance, strength, gait.  TARGET 3 weeks:  12/18/2019    Baseline 10/21/19: pt. verbalizes independence with HEP.    Time 3    Period Weeks    Status Achieved      PT SHORT TERM GOAL #2   Title Pt will improve 5x sit<>stand score to less than or equal to 10 seconds for improved functional strength.    Baseline 12/03/2019:  10.12 sec; 12/21/2019:  10.15 sec; 8.69 sec at best    Time 3    Period Weeks    Status Achieved      PT SHORT TERM GOAL #3   Title Pt will ambulate at least 4 ft/sec for improved ability to follow toddler grandchild with caregiving.    Baseline 3.58 ft/sec; 9/13:  9.43 sec = 3.47 ft/sec; fast: 8.31 sec = 3.95 ft/sec 12/21/2019    Time 3    Period Weeks    Status Partially Met             PT Long  Term Goals - 01/07/20 2213       PT LONG TERM GOAL #1   Title Pt will verbalize plans for continued community fitness upon d/c from PT, to maximize gains made in PT.  TARGET 02/05/2020    Baseline Have initiated conversation on community fitness; pt has not followed up    Time 4    Period Weeks    Status On-going      PT LONG TERM GOAL #2   Title Pt will perform 6 MWT, at least 1250 ft, with no more than 2 point increase in pain for improved posture and efficiency with gait.    Baseline 1200 ft, 4-5/10 pain.; 01/04/2020:  1115 ft outdoors, 2>3/10 pain with gait    Time 4    Period Weeks    Status On-going      PT LONG TERM GOAL #3   Title Pt will improve gait velocity to 4 ft/sec for improved gait speed to help care for grandchild.    Baseline 3.79 ft/sec    Time 4    Period Weeks    Status Revised      PT LONG TERM GOAL #4   Title FOTO score to improve to at least 60 % for improved overall reported functional mobility.    Baseline 40 at eval, 53% 01/06/2020    Time 4    Period Weeks    Status Revised      PT LONG TERM GOAL #5   Title --    Baseline --    Time --    Period --    Status --                 Plan - 01/13/20 1539    Clinical Impression Statement Skilled PT session today focused on stretching and exercises for L foot, as toes are curling more frequently, and creating pain.  Demonstrated stretching, range of motion/positioning for L foot and toes, as well as strategies for positioning ina nd out of shoe.  Ultimately, asked pt to talk to MD regarding toe curling, as it is likely increased tone related.  Able to progress to compliant surface dynamic gait and jogging activities, without increased c/o pain.  Pt will continue to benefit from skilled PT to further address working towards [place].    Personal Factors and Comorbidities Comorbidity 3+    Comorbidities PMH:  diabetes, hyperlipidemia, smoking, breast cancer, R carotid stenosis requiring emergent thrombectomy, anxiety depression     Examination-Activity Limitations Locomotion Level;Transfers;Reach Overhead;Stand;Stairs;Lift;Dressing    Examination-Participation Restrictions Community Activity;Other   caring for grandchild   Stability/Clinical Decision Making Evolving/Moderate complexity    Rehab Potential Good    PT Frequency 2x / week    PT Duration 4 weeks   per recert 7/59/1638   PT Treatment/Interventions ADLs/Self Care Home Management;Electrical Stimulation;DME Instruction;Gait training;Stair training;Functional mobility training;Therapeutic activities;Therapeutic exercise;Balance training;Neuromuscular re-education;Manual techniques;Orthotic Fit/Training;Patient/family education;Passive range of motion;Aquatic Therapy    PT Next Visit Plan continue to work on L hip strength, core stability/strength and gait training; work on changing speeds/agility and balance; discussplans for transition to community fitness.    PT Home Exercise Plan Access Code: [email or web address]    Consulted and Agree with Plan of Care Patient           Patient will benefit from skilled therapeutic intervention in order to improve the following deficits and impairments:  Abnormal gait, Decreased coordination, Difficulty walking, Impaired tone, Impaired UE functional use, Decreased activity tolerance, Decreased balance, Decreased mobility,  Decreased strength, Postural dysfunction  Visit Diagnosis: Muscle weakness (generalized)  Unsteadiness on feet  Other abnormalities of gait and mobility     Problem List Patient Active Problem List   Diagnosis Date Noted  . Lymph node enlargement   . Cellulitis of groin, right   . Cellulitis   . Sepsis ([place]) 11/20/2019  . Acute inguinal lymphadenitis   . Vascular headache   . Other chronic pain   . Uncontrolled type 2 diabetes mellitus with hyperglycemia ([place])   . Labile blood glucose   . Panic disorder with agoraphobia and moderate panic attacks   . Major depressive disorder, recurrent episode, moderate  ([place])   . MCI (mild cognitive impairment) with memory loss   . Acute ischemic right middle cerebral artery (MCA) stroke ([place]) 03/09/2019  . Left hemiparesis ([place])   . Bradycardia   . Tachypnea   . SIRS (systemic inflammatory response syndrome) ([facility])   . Hypokalemia   . Diabetes mellitus type 2 in obese ([place])   . Tobacco abuse   . History of breast cancer   . Cognitive deficits   . Dysphagia, post-stroke   . Stroke (cerebrum) ([place]) 03/02/2019  . Middle cerebral artery embolism, right 03/02/2019  . Chest pain 12/19/2015  . Numbness 12/19/2015  . Femoral hernia 03/24/2014  . Dyslipidemia 09/10/2011  . BRCA1 positive 09/10/2011  . Breast cancer ([place]) 09/10/2011  . H/O gastric bypass; 05/14/11([facility]) 09/10/2011  . Essential hypertension 10/28/2008  . DEPRESSION/ANXIETY 02/19/2007  . CARPAL TUNNEL SYNDROME, RIGHT 02/19/2007  . ALLERGIC RHINITIS 02/19/2007  . GERD 02/19/2007  . IRRITABLE BOWEL SYNDROME 02/19/2007  . HERPES GENITALIS 12/30/2006  . Diabetes ([place]) 12/30/2006  . OSTEOPENIA 12/30/2006  . ANOMALY, CONGENITAL, NERVOUS SYSTEM NEC 12/30/2006    Onesti Bonfiglio W. 01/13/2020, 3:47 PM  Frazier Butt., PT   [facility] [address] [place], [place], [address] Phone: [phone]   Fax:  [phone]  Name: Ann Lopez MRN: [id number] Date of Birth: Apr 02, 1971

## 2020-01-14 ENCOUNTER — Encounter: Payer: Self-pay | Admitting: Occupational Therapy

## 2020-01-14 ENCOUNTER — Other Ambulatory Visit: Payer: Self-pay

## 2020-01-14 ENCOUNTER — Ambulatory Visit: Payer: Medicare Other | Admitting: Physical Therapy

## 2020-01-14 ENCOUNTER — Ambulatory Visit: Payer: Medicare Other | Admitting: Occupational Therapy

## 2020-01-14 DIAGNOSIS — R29898 Other symptoms and signs involving the musculoskeletal system: Secondary | ICD-10-CM

## 2020-01-14 DIAGNOSIS — R293 Abnormal posture: Secondary | ICD-10-CM

## 2020-01-14 DIAGNOSIS — R278 Other lack of coordination: Secondary | ICD-10-CM

## 2020-01-14 DIAGNOSIS — R2689 Other abnormalities of gait and mobility: Secondary | ICD-10-CM

## 2020-01-14 DIAGNOSIS — M6281 Muscle weakness (generalized): Secondary | ICD-10-CM

## 2020-01-14 DIAGNOSIS — R29818 Other symptoms and signs involving the nervous system: Secondary | ICD-10-CM

## 2020-01-14 DIAGNOSIS — R2681 Unsteadiness on feet: Secondary | ICD-10-CM

## 2020-01-14 DIAGNOSIS — I69354 Hemiplegia and hemiparesis following cerebral infarction affecting left non-dominant side: Secondary | ICD-10-CM

## 2020-01-14 DIAGNOSIS — M25512 Pain in left shoulder: Secondary | ICD-10-CM

## 2020-01-14 NOTE — Therapy (Signed)
[facility] [address] [place], [place], [address] Phone: [phone]   Fax:  [phone]  Physical Therapy Treatment  Patient Details  Name: Ann Lopez MRN: [id number] Date of Birth: March 03, 1971 Referring Provider (PT): Dr. Leeroy Cha   Encounter Date: 01/14/2020   PT End of Session - 01/14/20 1711    Visit Number 35    Number of Visits 41    Date for PT Re-Evaluation 09/98/33   60 day recert for 4 wk POC   Authorization Type [facility] Medicare/Medicaid    Progress Note Due on Visit 43   PN completed 01/06/2020, visit 56   PT Start Time 1319    PT Stop Time 1400    PT Time Calculation (min) 41 min    Activity Tolerance Patient tolerated treatment well   Pt reports pain "about the same" at end of session   Behavior During Therapy [facility] for tasks assessed/performed;Flat affect           Past Medical History:  Diagnosis Date  . Allergy   . Anxiety   . Asthma   . Back pain   . Breast cancer ([place])   . Cancer ([place])   . Depression   . Diabetes mellitus   . Headache   . Hyperlipemia   . Osteopenia   . Osteoporosis    osteopenia  . Stroke [facility])     Past Surgical History:  Procedure Laterality Date  . ABDOMINAL HYSTERECTOMY     B oophorectomy for BRCA1 gene  . BREAST SURGERY    . CESAREAN SECTION    . INCISION AND DRAINAGE ABSCESS Left 11/14/2012   Procedure: INCISION AND DRAINAGE ABSCESS LEFT GROIN;  Surgeon: Jamesetta So, MD;  Location: [facility];  Service: General;  Laterality: Left;  . INGUINAL LYMPH NODE BIOPSY Right 11/23/2019   Procedure: INGUINAL LYMPH NODE BIOPSY;  Surgeon: Aviva Signs, MD;  Location: [facility];  Service: General;  Laterality: Right;  . IR ANGIO VERTEBRAL SEL SUBCLAVIAN INNOMINATE UNI R MOD SED  03/02/2019  . IR CT HEAD LTD  03/02/2019  . IR INTRAVSC STENT CERV CAROTID W/O EMB-PROT MOD SED INC ANGIO  03/02/2019  . IR PERCUTANEOUS ART THROMBECTOMY/INFUSION INTRACRANIAL INC DIAG ANGIO   03/02/2019  . LYMPHADENECTOMY    . MASTECTOMY Bilateral   . RADIOLOGY WITH ANESTHESIA N/A 03/02/2019   Procedure: IR WITH ANESTHESIA;  Surgeon: Luanne Bras, MD;  Location: [facility];  Service: Radiology;  Laterality: N/A;    There were no vitals filed for this visit.   Subjective Assessment - 01/14/20 1322    Subjective Nothing new.  Just frustrated (about OT appts next week).    Pertinent History ASN:[id number], hyperlipidemia, smoking, breast cancer, R carotid stenosis requiring emergent thrombectomy; 11/23/2019:  surgery to remove infected lymph node R groin    Patient Stated Goals Pt wants all the movement back in L side.    Currently in Pain? No/denies    Pain Onset In the past 7 days                             [facility] Adult PT Treatment/Exercise - 01/14/20 1320      Ambulation/Gait   Ambulation/Gait Yes    Gait Comments Dynamic gait activities:  forward/back walking 20-30 ft x 3 laps, with cues for equal/even step length LLE in posterior direction.  Forward marching 20-30 ft, x 4 reps with min guard assistance.  Jogging 20 ft x 3 laps with no LOB, good foot clearance LLE.      Therapeutic Activites    Therapeutic Activities Other Therapeutic Activities    Other Therapeutic Activities Floor ladder negotiation-forward march x 2 laps, sidestep x 2 laps, step out/out, in/in with L foot leading through, then with R foot leading through, x 2 laps each.  Using floor ladder to step through diagonal change of directions, near LOB with turns/change of direction with pivot on L foot.        Exercises   Exercises Other Exercises    Other Exercises  Heel/toe raises x 10 reps, Stagger stance forward/back rocking x 10 reps with cues for preventing L recurvatum, for improved L ankle dorsiflexion; alternating heel raises x 10 reps, with cues to prevent L knee recurvatum.        Knee/Hip Exercises: Aerobic   Nustep BLEs only, Level 5 x 8 minutes, maintaining 50-60 steps/minute.       Other Aerobic Discussed barriers to returning to [facility] and encouraged pt to reach out to family/friends to have accountability partner for attending [employer] to use aerobic machines.  Pt reports she is planning to do that and return to [facility] upon d/c from PT to replace PT time with [employer] time.      Knee/Hip Exercises: Standing   Lateral Step Up Left;1 set;10 reps;Hand Hold: 1;Step Height: 6"    Forward Step Up Left;10 reps;Right;2 sets;Hand Hold: 1;Step Height: 6"    Forward Step Up Limitations Step up/up, down/ down x 10, then single leg step up x 10    Step Down Left;Right;1 set;10 reps;Hand Hold: 1;Step Height: 6"    Functional Squat 1 set;10 reps   Standing on rockerboard lateral direction   Other Standing Knee Exercises Standing hip excursion exercises on rockerboard, lateral weigthshfiting 2 sets x 10 reps                    PT Short Term Goals - 12/22/19 2041      PT SHORT TERM GOAL #1   Title Pt will be independent with progression of HEP for improved balance, strength, gait.  TARGET 3 weeks:  12/18/2019    Baseline 10/21/19: pt. verbalizes independence with HEP.    Time 3    Period Weeks    Status Achieved      PT SHORT TERM GOAL #2   Title Pt will improve 5x sit<>stand score to less than or equal to 10 seconds for improved functional strength.    Baseline 12/03/2019:  10.12 sec; 12/21/2019:  10.15 sec; 8.69 sec at best    Time 3    Period Weeks    Status Achieved      PT SHORT TERM GOAL #3   Title Pt will ambulate at least 4 ft/sec for improved ability to follow toddler grandchild with caregiving.    Baseline 3.58 ft/sec; 9/13:  9.43 sec = 3.47 ft/sec; fast: 8.31 sec = 3.95 ft/sec 12/21/2019    Time 3    Period Weeks    Status Partially Met             PT Long Term Goals - 01/07/20 2213      PT LONG TERM GOAL #1   Title Pt will verbalize plans for continued community fitness upon d/c from PT, to maximize gains made in PT.  TARGET 02/05/2020    Baseline Have  initiated conversation on community fitness; pt has not followed up  Time 4    Period Weeks    Status On-going      PT LONG TERM GOAL #2   Title Pt will perform 6 MWT, at least 1250 ft, with no more than 2 point increase in pain for improved posture and efficiency with gait.    Baseline 1200 ft, 4-5/10 pain.; 01/04/2020:  1115 ft outdoors, 2>3/10 pain with gait    Time 4    Period Weeks    Status On-going      PT LONG TERM GOAL #3   Title Pt will improve gait velocity to 4 ft/sec for improved gait speed to help care for grandchild.    Baseline 3.79 ft/sec    Time 4    Period Weeks    Status Revised      PT LONG TERM GOAL #4   Title FOTO score to improve to at least 60 % for improved overall reported functional mobility.    Baseline 40 at eval, 53% 01/06/2020    Time 4    Period Weeks    Status Revised      PT LONG TERM GOAL #5   Title --    Baseline --    Time --    Period --    Status --                 Plan - 01/14/20 1711    Clinical Impression Statement Able to work on more dynamic gait and balance activities this session and last, with pt reporting minimal increase in pain.  Took rest breaks between exercises to help pt stretch/relax L toes.  Pt appears to be doing better with trunk dissociation and less RUE reliance with step up exercises.  Slowed pace at times and near LOB with several floor ladder activities.  She will cotninue to benefit from skilled PT to work towards [place].    Personal Factors and Comorbidities Comorbidity 3+    Comorbidities PMH:  diabetes, hyperlipidemia, smoking, breast cancer, R carotid stenosis requiring emergent thrombectomy, anxiety depression    Examination-Activity Limitations Locomotion Level;Transfers;Reach Overhead;Stand;Stairs;Lift;Dressing    Examination-Participation Restrictions Community Activity;Other   caring for grandchild   Stability/Clinical Decision Making Evolving/Moderate complexity    Rehab Potential Good    PT  Frequency 2x / week    PT Duration 4 weeks   per recert 7/62/8315   PT Treatment/Interventions ADLs/Self Care Home Management;Electrical Stimulation;DME Instruction;Gait training;Stair training;Functional mobility training;Therapeutic activities;Therapeutic exercise;Balance training;Neuromuscular re-education;Manual techniques;Orthotic Fit/Training;Patient/family education;Passive range of motion;Aquatic Therapy    PT Next Visit Plan Core stability, L hip strength, trunk dissociation, work on changing speeds, balance, agility; continue to work towards transition to community fitness.    PT Home Exercise Plan Access Code: [email or web address]    Consulted and Agree with Plan of Care Patient           Patient will benefit from skilled therapeutic intervention in order to improve the following deficits and impairments:  Abnormal gait, Decreased coordination, Difficulty walking, Impaired tone, Impaired UE functional use, Decreased activity tolerance, Decreased balance, Decreased mobility, Decreased strength, Postural dysfunction  Visit Diagnosis: Unsteadiness on feet  Muscle weakness (generalized)  Other abnormalities of gait and mobility  Abnormal posture     Problem List Patient Active Problem List   Diagnosis Date Noted  . Lymph node enlargement   . Cellulitis of groin, right   . Cellulitis   . Sepsis ([place]) 11/20/2019  . Acute inguinal lymphadenitis   . Vascular headache   . Other chronic  pain   . Uncontrolled type 2 diabetes mellitus with hyperglycemia ([place])   . Labile blood glucose   . Panic disorder with agoraphobia and moderate panic attacks   . Major depressive disorder, recurrent episode, moderate ([place])   . MCI (mild cognitive impairment) with memory loss   . Acute ischemic right middle cerebral artery (MCA) stroke ([place]) 03/09/2019  . Left hemiparesis ([place])   . Bradycardia   . Tachypnea   . SIRS (systemic inflammatory response syndrome) ([facility])   . Hypokalemia   . Diabetes  mellitus type 2 in obese ([place])   . Tobacco abuse   . History of breast cancer   . Cognitive deficits   . Dysphagia, post-stroke   . Stroke (cerebrum) ([place]) 03/02/2019  . Middle cerebral artery embolism, right 03/02/2019  . Chest pain 12/19/2015  . Numbness 12/19/2015  . Femoral hernia 03/24/2014  . Dyslipidemia 09/10/2011  . BRCA1 positive 09/10/2011  . Breast cancer ([place]) 09/10/2011  . H/O gastric bypass; 05/14/11([facility]) 09/10/2011  . Essential hypertension 10/28/2008  . DEPRESSION/ANXIETY 02/19/2007  . CARPAL TUNNEL SYNDROME, RIGHT 02/19/2007  . ALLERGIC RHINITIS 02/19/2007  . GERD 02/19/2007  . IRRITABLE BOWEL SYNDROME 02/19/2007  . HERPES GENITALIS 12/30/2006  . Diabetes ([place]) 12/30/2006  . OSTEOPENIA 12/30/2006  . ANOMALY, CONGENITAL, NERVOUS SYSTEM NEC 12/30/2006    Soumya Colson W. 01/14/2020, 5:17 PM Frazier Butt., PT  [facility] [address] [place], [place], [address] Phone: [phone]   Fax:  [phone]  Name: Ann Lopez MRN: [id number] Date of Birth: 10/26/1970

## 2020-01-14 NOTE — Therapy (Signed)
[facility] [address] [place], [place], [address] Phone: [phone]   Fax:  [phone]  Occupational Therapy Treatment  Patient Details  Name: Ann Lopez MRN: [id number] Date of Birth: 1970-12-06 Referring Provider (OT): Dr. Leeroy Cha   Encounter Date: 01/14/2020   OT End of Session - 01/14/20 1528    Visit Number 22    Number of Visits 33    Date for OT Re-Evaluation 02/15/20    Authorization Type [employer] Medicare/ Medicaid, covered 100% (medicare guidelines)    Authorization Time Period cert date 11/08/42-81/8/56    Authorization - Number of Visits 22    Progress Note Due on Visit 30    OT Start Time 1230    OT Stop Time 1315    OT Time Calculation (min) 45 min    Activity Tolerance Patient tolerated treatment well    Behavior During Therapy [facility] for tasks assessed/performed           Past Medical History:  Diagnosis Date  . Allergy   . Anxiety   . Asthma   . Back pain   . Breast cancer ([place])   . Cancer ([place])   . Depression   . Diabetes mellitus   . Headache   . Hyperlipemia   . Osteopenia   . Osteoporosis    osteopenia  . Stroke [facility])     Past Surgical History:  Procedure Laterality Date  . ABDOMINAL HYSTERECTOMY     B oophorectomy for BRCA1 gene  . BREAST SURGERY    . CESAREAN SECTION    . INCISION AND DRAINAGE ABSCESS Left 11/14/2012   Procedure: INCISION AND DRAINAGE ABSCESS LEFT GROIN;  Surgeon: Jamesetta So, MD;  Location: [facility];  Service: General;  Laterality: Left;  . INGUINAL LYMPH NODE BIOPSY Right 11/23/2019   Procedure: INGUINAL LYMPH NODE BIOPSY;  Surgeon: Aviva Signs, MD;  Location: [facility];  Service: General;  Laterality: Right;  . IR ANGIO VERTEBRAL SEL SUBCLAVIAN INNOMINATE UNI R MOD SED  03/02/2019  . IR CT HEAD LTD  03/02/2019  . IR INTRAVSC STENT CERV CAROTID W/O EMB-PROT MOD SED INC ANGIO  03/02/2019  . IR PERCUTANEOUS ART THROMBECTOMY/INFUSION INTRACRANIAL INC  DIAG ANGIO  03/02/2019  . LYMPHADENECTOMY    . MASTECTOMY Bilateral   . RADIOLOGY WITH ANESTHESIA N/A 03/02/2019   Procedure: IR WITH ANESTHESIA;  Surgeon: Luanne Bras, MD;  Location: [facility];  Service: Radiology;  Laterality: N/A;    There were no vitals filed for this visit.   Subjective Assessment - 01/14/20 1250    Subjective  Patient upset because OT visit cancelled next week    Patient is accompanied by: Family member    Pertinent History R MCA  stroke with L shoulder pain.  PMH:  diabetes, anxiety, depression hyperlipidemia, smoking, breast cancer s/p bilateral mastectomy followed by chmo in 2009, R carotid stenosis requiring emergent thrombectomy    Limitations fall risk, pain    Patient Stated Goals improve L shoudler pain and ROM, be able to care for grandchild alone    Currently in Pain? Yes    Pain Score 2     Pain Location Shoulder    Pain Orientation Left    Pain Descriptors / Indicators Aching    Pain Type Acute pain    Pain Onset 1 to 4 weeks ago    Pain Frequency Intermittent    Aggravating Factors  particualr movements    Pain Relieving Factors Pain meds,  heat                        OT Treatments/Exercises (OP) - 01/14/20 0001      Neurological Re-education Exercises   Other Exercises 1 Gentle PROM to AAROM to address shoulder flex, abd, ext rotation .  Patient with limited range of motion in those motions, and does bets with very slow input, and slight traction.        Ultrasound   Ultrasound Location LATERAL HUMERUS, SUPERIOR SCAPULA/SHOULDER    Ultrasound Parameters 50% pulsed, 48mhz, 0.8 w/cm2, x 8 min    Ultrasound Goals Pain      Manual Therapy   Scapular Mobilization In all planes to address scapular and [place] joint mobility                     OT Short Term Goals - 01/14/20 1529      OT SHORT TERM GOAL #1   Title Pt will be independent with initial HEP.--check STGs 11/13/19 (extended due to scheduling)    Time 4     Period Weeks    Status Achieved      OT SHORT TERM GOAL #2   Title Pt will demo at least 70* L shoulder flexion for functional reaching without pain.    Baseline 40*    Time 4    Period Weeks    Status Achieved   10/29/19:  approx 90*     OT SHORT TERM GOAL #3   Title Pt will verbalize understanding of proper positioning of LUE for decr pain.    Time 4    Period Weeks    Status Achieved      OT SHORT TERM GOAL #4   Title Pt will be able to use LUE in simple light home maintenance tasks as non-dominant assist at least 50% of the time with pain less than 3/10 (wiping table, folding clothes, sweeping).    Time 4    Period Weeks    Status Achieved      OT SHORT TERM GOAL #5   Title Pt will demo improved LUE functional reaching for ADLs as shown by improving score on box and blocks test by at least 6.    Baseline 28 blocks    Time 4    Period Weeks    Status Achieved   36     OT SHORT TERM GOAL #6   Title Pt will verbalize understanding of adaptive strategies/AE to incr independence with cooking tasks.    Time 4    Period Weeks    Status Achieved             OT Long Term Goals - 01/14/20 1529      OT LONG TERM GOAL #1   Title Pt will be independent with updated HEP for LUE--check LTGs 11/24/19    Status On-going      OT LONG TERM GOAL #2   Title Pt will demo at least 90* L shoulder flex without pain for functional reaching.    Baseline 40* initally. 80* 12/15/19 seated.  90* supine 12/15/19    Time 8    Period Weeks    Status On-going      OT LONG TERM GOAL #3   Title Pt will be able to use LUE as nondominant assist for functional tasks at least 75% of the time for light ADLs and IADLs with pain consistently less than or equal to  2/10.    Baseline less than 50%  01/06/20    Time 8    Period Weeks    Status On-going      OT LONG TERM GOAL #4   Title Pt will improve L grip strength by at least 8lbs to assist in opening containers/home maintenance tasks.    Baseline  42.7lbs initially, 46.7 12/15/19    Time 8    Period Weeks    Status On-going      OT LONG TERM GOAL #5   Title Pt will improve LUE coordination as shown by improving time on 9-hole peg test by at least 8sec.    Baseline 40.81sec initally, 38.78 12/15/19    Time 8    Period Weeks    Status On-going      OT LONG TERM GOAL #6   Title Pt will improve LUE functional reaching for ADLs as shown by improving score on box and blocks test by at least 10.    Baseline 28 initially, 39 - 12/15/19    Time 8    Period Weeks    Status Achieved                 Plan - 01/14/20 1528    Clinical Impression Statement Patient with slightly improved  shoulder pain, eagerly awaiting nerve block next week.    OT Occupational Profile and History Detailed Assessment- Review of Records and additional review of physical, cognitive, psychosocial history related to current functional performance    Occupational performance deficits (Please refer to evaluation for details): IADL's;ADL's;Leisure    Body Structure / Function / Physical Skills ADL;Balance;IADL;ROM;Strength;Tone;FMC;Coordination;UE functional use;Decreased knowledge of precautions;GMC;Decreased knowledge of use of DME;Pain    Rehab Potential Good    Clinical Decision Making Several treatment options, min-mod task modification necessary    Comorbidities Affecting Occupational Performance: May have comorbidities impacting occupational performance    Modification or Assistance to Complete Evaluation  Min-Moderate modification of tasks or assist with assess necessary to complete eval    OT Frequency 3x / week    OT Duration 8 weeks    OT Treatment/Interventions Self-care/ADL training;Moist Heat;DME and/or AE instruction;Therapeutic activities;Aquatic Therapy;Therapeutic exercise;Ultrasound;Passive range of motion;Functional Mobility Training;Neuromuscular education;Cryotherapy;Electrical Stimulation;Manual Therapy;Patient/family education    Plan  ultrasound and manual prn, neuro re-ed, coordination HEP, functional reach with focus on shoulder positioning, add seated HEP body on arm arm on table    OT Home Exercise Plan supine bilateral shoulder exercises    Consulted and Agree with Plan of Care Patient           Patient will benefit from skilled therapeutic intervention in order to improve the following deficits and impairments:   Body Structure / Function / Physical Skills: ADL, Balance, IADL, ROM, Strength, Tone, FMC, Coordination, UE functional use, Decreased knowledge of precautions, GMC, Decreased knowledge of use of DME, Pain       Visit Diagnosis: Hemiplegia and hemiparesis following cerebral infarction affecting left non-dominant side (HCC)  Abnormal posture  Unsteadiness on feet  Acute pain of left shoulder  Other lack of coordination  Other symptoms and signs involving the nervous system  Other symptoms and signs involving the musculoskeletal system  Muscle weakness (generalized)    Problem List Patient Active Problem List   Diagnosis Date Noted  . Lymph node enlargement   . Cellulitis of groin, right   . Cellulitis   . Sepsis ([place]) 11/20/2019  . Acute inguinal lymphadenitis   . Vascular headache   .  Other chronic pain   . Uncontrolled type 2 diabetes mellitus with hyperglycemia ([place])   . Labile blood glucose   . Panic disorder with agoraphobia and moderate panic attacks   . Major depressive disorder, recurrent episode, moderate ([place])   . MCI (mild cognitive impairment) with memory loss   . Acute ischemic right middle cerebral artery (MCA) stroke ([place]) 03/09/2019  . Left hemiparesis ([place])   . Bradycardia   . Tachypnea   . SIRS (systemic inflammatory response syndrome) (HCC)   . Hypokalemia   . Diabetes mellitus type 2 in obese ([place])   . Tobacco abuse   . History of breast cancer   . Cognitive deficits   . Dysphagia, post-stroke   . Stroke (cerebrum) ([place]) 03/02/2019  . Middle cerebral  artery embolism, right 03/02/2019  . Chest pain 12/19/2015  . Numbness 12/19/2015  . Femoral hernia 03/24/2014  . Dyslipidemia 09/10/2011  . BRCA1 positive 09/10/2011  . Breast cancer ([place]) 09/10/2011  . H/O gastric bypass; 05/14/11([facility]) 09/10/2011  . Essential hypertension 10/28/2008  . DEPRESSION/ANXIETY 02/19/2007  . CARPAL TUNNEL SYNDROME, RIGHT 02/19/2007  . ALLERGIC RHINITIS 02/19/2007  . GERD 02/19/2007  . IRRITABLE BOWEL SYNDROME 02/19/2007  . HERPES GENITALIS 12/30/2006  . Diabetes ([place]) 12/30/2006  . OSTEOPENIA 12/30/2006  . ANOMALY, CONGENITAL, NERVOUS SYSTEM NEC 12/30/2006    Mariah Milling, OTR/L 01/14/2020, 3:30 PM  [facility] [address] [place], [place], [address] Phone: [phone]   Fax:  [phone]  Name: Ann Lopez MRN: [id number] Date of Birth: 09/04/1970

## 2020-01-19 ENCOUNTER — Ambulatory Visit: Payer: Medicare Other | Admitting: Physical Therapy

## 2020-01-19 ENCOUNTER — Other Ambulatory Visit: Payer: Self-pay

## 2020-01-19 ENCOUNTER — Encounter: Payer: Medicare Other | Admitting: Occupational Therapy

## 2020-01-19 ENCOUNTER — Ambulatory Visit: Payer: Medicare Other | Admitting: Occupational Therapy

## 2020-01-19 DIAGNOSIS — I69354 Hemiplegia and hemiparesis following cerebral infarction affecting left non-dominant side: Secondary | ICD-10-CM

## 2020-01-19 DIAGNOSIS — M25512 Pain in left shoulder: Secondary | ICD-10-CM

## 2020-01-19 DIAGNOSIS — R2689 Other abnormalities of gait and mobility: Secondary | ICD-10-CM

## 2020-01-19 DIAGNOSIS — M6281 Muscle weakness (generalized): Secondary | ICD-10-CM | POA: Diagnosis not present

## 2020-01-19 DIAGNOSIS — R293 Abnormal posture: Secondary | ICD-10-CM

## 2020-01-19 NOTE — Therapy (Signed)
[facility] [address] [place], [place], [address] Phone: [phone]   Fax:  [phone]  Physical Therapy Treatment  Patient Details  Name: Ann Lopez MRN: [id number] Date of Birth: 1971-02-08 Referring Provider (PT): Dr. Leeroy Cha   Encounter Date: 01/19/2020   PT End of Session - 01/19/20 1706    Visit Number 36    Number of Visits 41    Date for PT Re-Evaluation 75/88/32   60 day recert for 4 wk POC   Authorization Type [facility] Medicare/Medicaid    Progress Note Due on Visit 43   PN completed 01/06/2020, visit 61   PT Start Time 1232    PT Stop Time 1314    PT Time Calculation (min) 42 min    Activity Tolerance Patient tolerated treatment well   Pt reports pain "about the same" at end of session   Behavior During Therapy [facility] for tasks assessed/performed;Flat affect           Past Medical History:  Diagnosis Date  . Allergy   . Anxiety   . Asthma   . Back pain   . Breast cancer ([place])   . Cancer ([place])   . Depression   . Diabetes mellitus   . Headache   . Hyperlipemia   . Osteopenia   . Osteoporosis    osteopenia  . Stroke [facility])     Past Surgical History:  Procedure Laterality Date  . ABDOMINAL HYSTERECTOMY     B oophorectomy for BRCA1 gene  . BREAST SURGERY    . CESAREAN SECTION    . INCISION AND DRAINAGE ABSCESS Left 11/14/2012   Procedure: INCISION AND DRAINAGE ABSCESS LEFT GROIN;  Surgeon: Jamesetta So, MD;  Location: [facility];  Service: General;  Laterality: Left;  . INGUINAL LYMPH NODE BIOPSY Right 11/23/2019   Procedure: INGUINAL LYMPH NODE BIOPSY;  Surgeon: Aviva Signs, MD;  Location: [facility];  Service: General;  Laterality: Right;  . IR ANGIO VERTEBRAL SEL SUBCLAVIAN INNOMINATE UNI R MOD SED  03/02/2019  . IR CT HEAD LTD  03/02/2019  . IR INTRAVSC STENT CERV CAROTID W/O EMB-PROT MOD SED INC ANGIO  03/02/2019  . IR PERCUTANEOUS ART THROMBECTOMY/INFUSION INTRACRANIAL INC DIAG  ANGIO  03/02/2019  . LYMPHADENECTOMY    . MASTECTOMY Bilateral   . RADIOLOGY WITH ANESTHESIA N/A 03/02/2019   Procedure: IR WITH ANESTHESIA;  Surgeon: Luanne Bras, MD;  Location: [facility];  Service: Radiology;  Laterality: N/A;    There were no vitals filed for this visit.   Subjective Assessment - 01/19/20 1234    Subjective No pain, got the nerve block yesterday (Dr. Delilah Shan).  No other changes.    Pertinent History PQD:IYMEBRAX, hyperlipidemia, smoking, breast cancer, R carotid stenosis requiring emergent thrombectomy; 11/23/2019:  surgery to remove infected lymph node R groin    Patient Stated Goals Pt wants all the movement back in L side.    Currently in Pain? No/denies    Pain Onset In the past 7 days                             [facility] Adult PT Treatment/Exercise - 01/19/20 0001      Ambulation/Gait   Ambulation/Gait Yes    Ambulation/Gait Assistance 5: Supervision    Ambulation Distance (Feet) 550 Feet    Assistive device None    Gait Pattern Step-through pattern;Decreased stance time - left;Decreased arm swing -  left;Trendelenburg;Lateral trunk lean to left    Ambulation Surface Level;Indoor    Gait Comments Cues to "zip up" posture through upright trunk and abdominal activation.  Cues to relax through L UE.      High Level Balance   High Level Balance Comments In gym area, 20-30 ft, braiding, with manual cues at hips to help with rotaiton/dissociation at hips.  Progressed to box step activity, with pt taking several steps forward, side, back, side; then reverse directions, performed x 4 reps no LOB.      Exercises   Exercises Knee/Hip      Knee/Hip Exercises: Aerobic   Nustep BLES and RUE, Level 5, x 8 minutes, cues for positioning with LLE to avoid hip external rotation      Knee/Hip Exercises: Standing   Forward Lunges Left;2 sets;10 reps   1st set LLE lunge only, then 2nd set, alternating   Side Lunges Right;Left;2 sets;10 reps   1st set LLE  only, then 2nd set alternating legs   Lateral Step Up Left;1 set;10 reps;Hand Hold: 1;Step Height: 4"   cues for lessening UE support; at aerobic step   Lateral Step Up Limitations sidestep up and over aerobic step, 5 reps each direction    Forward Step Up Left;10 reps;Right;2 sets;Hand Hold: 1;Step Height: 4"   aerobic step, cues for lessening EU support   Forward Step Up Limitations Step up/up, down/ down x 10, then single leg step up x 10    Step Down Left;Right;1 set;10 reps;Hand Hold: 1;Step Height: 4"   aerobic step, cues for lessening UE supprot                   PT Short Term Goals - 12/22/19 2041      PT SHORT TERM GOAL #1   Title Pt will be independent with progression of HEP for improved balance, strength, gait.  TARGET 3 weeks:  12/18/2019    Baseline 10/21/19: pt. verbalizes independence with HEP.    Time 3    Period Weeks    Status Achieved      PT SHORT TERM GOAL #2   Title Pt will improve 5x sit<>stand score to less than or equal to 10 seconds for improved functional strength.    Baseline 12/03/2019:  10.12 sec; 12/21/2019:  10.15 sec; 8.69 sec at best    Time 3    Period Weeks    Status Achieved      PT SHORT TERM GOAL #3   Title Pt will ambulate at least 4 ft/sec for improved ability to follow toddler grandchild with caregiving.    Baseline 3.58 ft/sec; 9/13:  9.43 sec = 3.47 ft/sec; fast: 8.31 sec = 3.95 ft/sec 12/21/2019    Time 3    Period Weeks    Status Partially Met             PT Long Term Goals - 01/07/20 2213      PT LONG TERM GOAL #1   Title Pt will verbalize plans for continued community fitness upon d/c from PT, to maximize gains made in PT.  TARGET 02/05/2020    Baseline Have initiated conversation on community fitness; pt has not followed up    Time 4    Period Weeks    Status On-going      PT LONG TERM GOAL #2   Title Pt will perform 6 MWT, at least 1250 ft, with no more than 2 point increase in pain for improved posture and  efficiency with gait.    Baseline 1200 ft, 4-5/10 pain.; 01/04/2020:  1115 ft outdoors, 2>3/10 pain with gait    Time 4    Period Weeks    Status On-going      PT LONG TERM GOAL #3   Title Pt will improve gait velocity to 4 ft/sec for improved gait speed to help care for grandchild.    Baseline 3.79 ft/sec    Time 4    Period Weeks    Status Revised      PT LONG TERM GOAL #4   Title FOTO score to improve to at least 60 % for improved overall reported functional mobility.    Baseline 40 at eval, 53% 01/06/2020    Time 4    Period Weeks    Status Revised      PT LONG TERM GOAL #5   Title --    Baseline --    Time --    Period --    Status --                 Plan - 01/19/20 1707    Clinical Impression Statement Focused more on strengthening work and dynamic gait today; pt able to maintain fairly upright trunk with lessening UE support with step up and lunge activities.  Pt reports only 1/10 pain at end of session in low back.  She will continue to benefit from skilled PT to work towards [place] for improved mobility.    Personal Factors and Comorbidities Comorbidity 3+    Comorbidities PMH:  diabetes, hyperlipidemia, smoking, breast cancer, R carotid stenosis requiring emergent thrombectomy, anxiety depression    Examination-Activity Limitations Locomotion Level;Transfers;Reach Overhead;Stand;Stairs;Lift;Dressing    Examination-Participation Restrictions Community Activity;Other   caring for grandchild   Stability/Clinical Decision Making Evolving/Moderate complexity    Rehab Potential Good    PT Frequency 2x / week    PT Duration 4 weeks   per recert 04/24/5788   PT Treatment/Interventions ADLs/Self Care Home Management;Electrical Stimulation;DME Instruction;Gait training;Stair training;Functional mobility training;Therapeutic activities;Therapeutic exercise;Balance training;Neuromuscular re-education;Manual techniques;Orthotic Fit/Training;Patient/family education;Passive range  of motion;Aquatic Therapy    PT Next Visit Plan Continue toward LTGs:  Core stability, L hip strength, trunk dissociation, work on changing speeds, balance, agility; continue to work towards transition to community fitness.    PT Home Exercise Plan Access Code: [email or web address]    Consulted and Agree with Plan of Care Patient           Patient will benefit from skilled therapeutic intervention in order to improve the following deficits and impairments:  Abnormal gait, Decreased coordination, Difficulty walking, Impaired tone, Impaired UE functional use, Decreased activity tolerance, Decreased balance, Decreased mobility, Decreased strength, Postural dysfunction  Visit Diagnosis: Muscle weakness (generalized)  Abnormal posture  Other abnormalities of gait and mobility     Problem List Patient Active Problem List   Diagnosis Date Noted  . Lymph node enlargement   . Cellulitis of groin, right   . Cellulitis   . Sepsis ([name]) 11/20/2019  . Acute inguinal lymphadenitis   . Vascular headache   . Other chronic pain   . Uncontrolled type 2 diabetes mellitus with hyperglycemia ([place])   . Labile blood glucose   . Panic disorder with agoraphobia and moderate panic attacks   . Major depressive disorder, recurrent episode, moderate ([place])   . MCI (mild cognitive impairment) with memory loss   . Acute ischemic right middle cerebral artery (MCA) stroke ([place]) 03/09/2019  . Left hemiparesis ([place])   .  Bradycardia   . Tachypnea   . SIRS (systemic inflammatory response syndrome) (HCC)   . Hypokalemia   . Diabetes mellitus type 2 in obese ([place])   . Tobacco abuse   . History of breast cancer   . Cognitive deficits   . Dysphagia, post-stroke   . Stroke (cerebrum) ([place]) 03/02/2019  . Middle cerebral artery embolism, right 03/02/2019  . Chest pain 12/19/2015  . Numbness 12/19/2015  . Femoral hernia 03/24/2014  . Dyslipidemia 09/10/2011  . BRCA1 positive 09/10/2011  . Breast cancer ([place]) 09/10/2011   . H/O gastric bypass; 05/14/11([facility]) 09/10/2011  . Essential hypertension 10/28/2008  . DEPRESSION/ANXIETY 02/19/2007  . CARPAL TUNNEL SYNDROME, RIGHT 02/19/2007  . ALLERGIC RHINITIS 02/19/2007  . GERD 02/19/2007  . IRRITABLE BOWEL SYNDROME 02/19/2007  . HERPES GENITALIS 12/30/2006  . Diabetes ([place]) 12/30/2006  . OSTEOPENIA 12/30/2006  . ANOMALY, CONGENITAL, NERVOUS SYSTEM NEC 12/30/2006    Cleo Santucci W. 01/19/2020, 5:10 PM  Frazier Butt., PT   [facility] [address] [place], [place], [address] Phone: [phone]   Fax:  [phone]  Name: ARIANA JUUL MRN: [id number] Date of Birth: [birth year]

## 2020-01-19 NOTE — Therapy (Signed)
[facility] [address] [place], [place], [address] Phone: [phone]   Fax:  [phone]  Occupational Therapy Treatment  Patient Details  Name: Ann Lopez MRN: [id number] Date of Birth: 12/05/1970 Referring Provider (OT): Dr. Krutika Raulkar   Encounter Date: 01/19/2020   OT End of Session - 01/19/20 1446    Visit Number 23    Number of Visits 33    Date for OT Re-Evaluation 02/15/20    Authorization Type [employer] Medicare/ Medicaid, covered 100% (medicare guidelines)    Authorization Time Period cert date 12/17/19-02/15/20    Authorization - Number of Visits 23    Progress Note Due on Visit 30    OT Start Time 1320    OT Stop Time 1405    OT Time Calculation (min) 45 min    Activity Tolerance Patient tolerated treatment well    Behavior During Therapy WFL for tasks assessed/performed           Past Medical History:  Diagnosis Date  . Allergy   . Anxiety   . Asthma   . Back pain   . Breast cancer (HCC)   . Cancer (HCC)   . Depression   . Diabetes mellitus   . Headache   . Hyperlipemia   . Osteopenia   . Osteoporosis    osteopenia  . Stroke (HCC)     Past Surgical History:  Procedure Laterality Date  . ABDOMINAL HYSTERECTOMY     B oophorectomy for BRCA1 gene  . BREAST SURGERY    . CESAREAN SECTION    . INCISION AND DRAINAGE ABSCESS Left 11/14/2012   Procedure: INCISION AND DRAINAGE ABSCESS LEFT GROIN;  Surgeon: Mark A Jenkins, MD;  Location: [facility];  Service: General;  Laterality: Left;  . INGUINAL LYMPH NODE BIOPSY Right 11/23/2019   Procedure: INGUINAL LYMPH NODE BIOPSY;  Surgeon: Jenkins, Mark, MD;  Location: [facility];  Service: General;  Laterality: Right;  . IR ANGIO VERTEBRAL SEL SUBCLAVIAN INNOMINATE UNI R MOD SED  03/02/2019  . IR CT HEAD LTD  03/02/2019  . IR INTRAVSC STENT CERV CAROTID W/O EMB-PROT MOD SED INC ANGIO  03/02/2019  . IR PERCUTANEOUS ART THROMBECTOMY/INFUSION INTRACRANIAL INC  DIAG ANGIO  03/02/2019  . LYMPHADENECTOMY    . MASTECTOMY Bilateral   . RADIOLOGY WITH ANESTHESIA N/A 03/02/2019   Procedure: IR WITH ANESTHESIA;  Surgeon: Deveshwar, Sanjeev, MD;  Location: [facility];  Service: Radiology;  Laterality: N/A;    There were no vitals filed for this visit.   Subjective Assessment - 01/19/20 1324    Subjective  I got a nerve block yesterday and the doctor wanted therapy to work on it today    Patient is accompanied by: Family member    Pertinent History R MCA  stroke with L shoulder pain.  PMH:  diabetes, anxiety, depression hyperlipidemia, smoking, breast cancer s/p bilateral mastectomy followed by chmo in 2009, R carotid stenosis requiring emergent thrombectomy    Limitations fall risk, pain    Patient Stated Goals improve L shoudler pain and ROM, be able to care for grandchild alone    Currently in Pain? No/denies    Pain Onset 1 to 4 weeks ago            Postponed coordination HEP as pt stressed importance of working shoulder following nerve block per MD recommendation.  Supine: began with self stretch in sh flexion, followed by P/ROM with no significant deficits in P/ROM. Performing bilateral shoulder   flexion with 2 lb weight w/ cues to avoid sh hiking. Also worked on reaching hands towards feet for scapula depression Sidelying: worked on scapula mobs - retraction and protraction w/ AA/ROM in level plane and then progressed to sh flexion and ext w/ joint mobs for scapula depression and upward rotation - pt initially had pain with this using compensations, but was able to improve w/ therapist guidance and joint mobs to scapula.  Prone: scapula retraction bilaterally w/ tactile cues, followed by prone wt bearing on elbows w/ chest lift for scapula depression.  Attempted wt bearing over BUE's in standing w/ A/P wt shifts however pt not performing correctly therefore d/c.  Pt w/ elevated Lt scapula compared to Rt and decreased mobility of scapula in depression.                        OT Short Term Goals - 01/14/20 1529      OT SHORT TERM GOAL #1   Title Pt will be independent with initial HEP.--check STGs 11/13/19 (extended due to scheduling)    Time 4    Period Weeks    Status Achieved      OT SHORT TERM GOAL #2   Title Pt will demo at least 70* L shoulder flexion for functional reaching without pain.    Baseline 40*    Time 4    Period Weeks    Status Achieved   10/29/19:  approx 90*     OT SHORT TERM GOAL #3   Title Pt will verbalize understanding of proper positioning of LUE for decr pain.    Time 4    Period Weeks    Status Achieved      OT SHORT TERM GOAL #4   Title Pt will be able to use LUE in simple light home maintenance tasks as non-dominant assist at least 50% of the time with pain less than 3/10 (wiping table, folding clothes, sweeping).    Time 4    Period Weeks    Status Achieved      OT SHORT TERM GOAL #5   Title Pt will demo improved LUE functional reaching for ADLs as shown by improving score on box and blocks test by at least 6.    Baseline 28 blocks    Time 4    Period Weeks    Status Achieved   36     OT SHORT TERM GOAL #6   Title Pt will verbalize understanding of adaptive strategies/AE to incr independence with cooking tasks.    Time 4    Period Weeks    Status Achieved             OT Long Term Goals - 01/14/20 1529      OT LONG TERM GOAL #1   Title Pt will be independent with updated HEP for LUE--check LTGs 11/24/19    Status On-going      OT LONG TERM GOAL #2   Title Pt will demo at least 90* L shoulder flex without pain for functional reaching.    Baseline 40* initally. 80* 12/15/19 seated.  90* supine 12/15/19    Time 8    Period Weeks    Status On-going      OT LONG TERM GOAL #3   Title Pt will be able to use LUE as nondominant assist for functional tasks at least 75% of the time for light ADLs and IADLs with pain consistently less than or equal  to 2/10.    Baseline less  than 50%  01/06/20    Time 8    Period Weeks    Status On-going      OT LONG TERM GOAL #4   Title Pt will improve L grip strength by at least 8lbs to assist in opening containers/home maintenance tasks.    Baseline 42.7lbs initially, 46.7 12/15/19    Time 8    Period Weeks    Status On-going      OT LONG TERM GOAL #5   Title Pt will improve LUE coordination as shown by improving time on 9-hole peg test by at least 8sec.    Baseline 40.81sec initally, 38.78 12/15/19    Time 8    Period Weeks    Status On-going      OT LONG TERM GOAL #6   Title Pt will improve LUE functional reaching for ADLs as shown by improving score on box and blocks test by at least 10.    Baseline 28 initially, 39 - 12/15/19    Time 8    Period Weeks    Status Achieved                 Plan - 01/19/20 1447    Clinical Impression Statement Pt reports decreased pain after nerve block yesterday. Pt able to tolerate most things today w/ little to no pain    OT Occupational Profile and History Detailed Assessment- Review of Records and additional review of physical, cognitive, psychosocial history related to current functional performance    Occupational performance deficits (Please refer to evaluation for details): IADL's;ADL's;Leisure    Body Structure / Function / Physical Skills ADL;Balance;IADL;ROM;Strength;Tone;FMC;Coordination;UE functional use;Decreased knowledge of precautions;GMC;Decreased knowledge of use of DME;Pain    Rehab Potential Good    Clinical Decision Making Several treatment options, min-mod task modification necessary    Comorbidities Affecting Occupational Performance: May have comorbidities impacting occupational performance    Modification or Assistance to Complete Evaluation  Min-Moderate modification of tasks or assist with assess necessary to complete eval    OT Frequency 3x / week    OT Duration 8 weeks    OT Treatment/Interventions Self-care/ADL training;Moist Heat;DME and/or AE  instruction;Therapeutic activities;Aquatic Therapy;Therapeutic exercise;Ultrasound;Passive range of motion;Functional Mobility Training;Neuromuscular education;Cryotherapy;Electrical Stimulation;Manual Therapy;Patient/family education    Plan ultrasound and manual prn, neuro re-ed, coordination HEP, functional reach with focus on shoulder positioning, add seated HEP body on arm arm on table and prone scapula retraction and on elbows    OT Home Exercise Plan supine bilateral shoulder exercises    Consulted and Agree with Plan of Care Patient           Patient will benefit from skilled therapeutic intervention in order to improve the following deficits and impairments:   Body Structure / Function / Physical Skills: ADL, Balance, IADL, ROM, Strength, Tone, FMC, Coordination, UE functional use, Decreased knowledge of precautions, GMC, Decreased knowledge of use of DME, Pain       Visit Diagnosis: Hemiplegia and hemiparesis following cerebral infarction affecting left non-dominant side (HCC)  Acute pain of left shoulder    Problem List Patient Active Problem List   Diagnosis Date Noted  . Lymph node enlargement   . Cellulitis of groin, right   . Cellulitis   . Sepsis (HCC) 11/20/2019  . Acute inguinal lymphadenitis   . Vascular headache   . Other chronic pain   . Uncontrolled type 2 diabetes mellitus with hyperglycemia (HCC)   . Labile blood   glucose   . Panic disorder with agoraphobia and moderate panic attacks   . Major depressive disorder, recurrent episode, moderate (HCC)   . MCI (mild cognitive impairment) with memory loss   . Acute ischemic right middle cerebral artery (MCA) stroke (HCC) 03/09/2019  . Left hemiparesis (HCC)   . Bradycardia   . Tachypnea   . SIRS (systemic inflammatory response syndrome) (HCC)   . Hypokalemia   . Diabetes mellitus type 2 in obese (HCC)   . Tobacco abuse   . History of breast cancer   . Cognitive deficits   . Dysphagia, post-stroke   .  Stroke (cerebrum) (HCC) 03/02/2019  . Middle cerebral artery embolism, right 03/02/2019  . Chest pain 12/19/2015  . Numbness 12/19/2015  . Femoral hernia 03/24/2014  . Dyslipidemia 09/10/2011  . BRCA1 positive 09/10/2011  . Breast cancer (HCC) 09/10/2011  . H/O gastric bypass; 05/14/11([facility]) 09/10/2011  . Essential hypertension 10/28/2008  . DEPRESSION/ANXIETY 02/19/2007  . CARPAL TUNNEL SYNDROME, RIGHT 02/19/2007  . ALLERGIC RHINITIS 02/19/2007  . GERD 02/19/2007  . IRRITABLE BOWEL SYNDROME 02/19/2007  . HERPES GENITALIS 12/30/2006  . Diabetes (HCC) 12/30/2006  . OSTEOPENIA 12/30/2006  . ANOMALY, CONGENITAL, NERVOUS SYSTEM NEC 12/30/2006    ,  Johnson, OTR/L 01/19/2020, 2:48 PM  [facility] [address] [place], [place], [address] Phone: [phone]   Fax:  [phone]  Name: Ann Lopez MRN: [id number] Date of Birth: 10/11/1970 

## 2020-01-20 ENCOUNTER — Encounter: Payer: Self-pay | Admitting: Occupational Therapy

## 2020-01-26 ENCOUNTER — Encounter: Payer: Self-pay | Admitting: Occupational Therapy

## 2020-01-26 ENCOUNTER — Ambulatory Visit: Payer: Medicare Other | Admitting: Physical Therapy

## 2020-01-26 ENCOUNTER — Other Ambulatory Visit: Payer: Self-pay

## 2020-01-26 ENCOUNTER — Ambulatory Visit: Payer: Medicare Other | Admitting: Occupational Therapy

## 2020-01-26 DIAGNOSIS — R29898 Other symptoms and signs involving the musculoskeletal system: Secondary | ICD-10-CM

## 2020-01-26 DIAGNOSIS — R2689 Other abnormalities of gait and mobility: Secondary | ICD-10-CM

## 2020-01-26 DIAGNOSIS — I69354 Hemiplegia and hemiparesis following cerebral infarction affecting left non-dominant side: Secondary | ICD-10-CM

## 2020-01-26 DIAGNOSIS — R2681 Unsteadiness on feet: Secondary | ICD-10-CM

## 2020-01-26 DIAGNOSIS — R293 Abnormal posture: Secondary | ICD-10-CM

## 2020-01-26 DIAGNOSIS — M6281 Muscle weakness (generalized): Secondary | ICD-10-CM | POA: Diagnosis not present

## 2020-01-26 DIAGNOSIS — M25512 Pain in left shoulder: Secondary | ICD-10-CM

## 2020-01-26 DIAGNOSIS — R278 Other lack of coordination: Secondary | ICD-10-CM

## 2020-01-26 DIAGNOSIS — R29818 Other symptoms and signs involving the nervous system: Secondary | ICD-10-CM

## 2020-01-26 NOTE — Therapy (Signed)
[facility] [address] [place], [place], [address] Phone: [phone]   Fax:  [phone]  Physical Therapy Treatment  Patient Details  Name: Ann Lopez MRN: [id number] Date of Birth: 1970-12-26 Referring Provider (PT): Dr. Leeroy Cha   Encounter Date: 01/26/2020   PT End of Session - 01/26/20 2040    Visit Number 37    Number of Visits 41    Date for PT Re-Evaluation 01/60/10   60 day recert for 4 wk POC   Authorization Type [facility] Medicare/Medicaid    Progress Note Due on Visit 43   PN completed 01/06/2020, visit 20   PT Start Time 1319    PT Stop Time 1401    PT Time Calculation (min) 42 min    Activity Tolerance Patient tolerated treatment well   No pain at end of session   Behavior During Therapy [facility] for tasks assessed/performed;Flat affect           Past Medical History:  Diagnosis Date  . Allergy   . Anxiety   . Asthma   . Back pain   . Breast cancer ([place])   . Cancer ([place])   . Depression   . Diabetes mellitus   . Headache   . Hyperlipemia   . Osteopenia   . Osteoporosis    osteopenia  . Stroke [facility])     Past Surgical History:  Procedure Laterality Date  . ABDOMINAL HYSTERECTOMY     B oophorectomy for BRCA1 gene  . BREAST SURGERY    . CESAREAN SECTION    . INCISION AND DRAINAGE ABSCESS Left 11/14/2012   Procedure: INCISION AND DRAINAGE ABSCESS LEFT GROIN;  Surgeon: Jamesetta So, MD;  Location: [facility];  Service: General;  Laterality: Left;  . INGUINAL LYMPH NODE BIOPSY Right 11/23/2019   Procedure: INGUINAL LYMPH NODE BIOPSY;  Surgeon: Aviva Signs, MD;  Location: [facility];  Service: General;  Laterality: Right;  . IR ANGIO VERTEBRAL SEL SUBCLAVIAN INNOMINATE UNI R MOD SED  03/02/2019  . IR CT HEAD LTD  03/02/2019  . IR INTRAVSC STENT CERV CAROTID W/O EMB-PROT MOD SED INC ANGIO  03/02/2019  . IR PERCUTANEOUS ART THROMBECTOMY/INFUSION INTRACRANIAL INC DIAG ANGIO  03/02/2019  .  LYMPHADENECTOMY    . MASTECTOMY Bilateral   . RADIOLOGY WITH ANESTHESIA N/A 03/02/2019   Procedure: IR WITH ANESTHESIA;  Surgeon: Luanne Bras, MD;  Location: [facility];  Service: Radiology;  Laterality: N/A;    There were no vitals filed for this visit.   Subjective Assessment - 01/26/20 1313    Subjective No pain, the arm has been good.  Lost my balance on small rocks last week and fell forwards on my knees.    Pertinent History XNA:TFTDDUKG, hyperlipidemia, smoking, breast cancer, R carotid stenosis requiring emergent thrombectomy; 11/23/2019:  surgery to remove infected lymph node R groin    Patient Stated Goals Pt wants all the movement back in L side.    Currently in Pain? No/denies    Pain Onset In the past 7 days                             [facility] Adult PT Treatment/Exercise - 01/26/20 0001      Ambulation/Gait   Ambulation/Gait Yes    Ambulation/Gait Assistance 5: Supervision    Ambulation Distance (Feet) 230 Feet   100   Assistive device None    Gait Pattern Step-through pattern;Decreased  stance time - left;Decreased arm swing - left;Trendelenburg;Lateral trunk lean to left    Ambulation Surface Level;Indoor    Gait Comments Cues to "zip up" posture through upright trunk and abdominal activation.  Cues to relax through L UE.  On unlevel blue and red mat surfaces, forward walking, with cues to slow pace, be deliberate with full foot placement to avoid rolling L foot.  Performed x 6 reps, with PT supervision.               Balance Exercises - 01/26/20 0001      Balance Exercises: Standing   Standing Eyes Opened Wide (BOA);Narrow base of support (BOS);Foam/compliant surface;5 reps;Limitations    Standing Eyes Opened Limitations Standing on 2 pillows in corner, chair in front for support as needed; head turns and nods x 10    Standing Eyes Closed Narrow base of support (BOS);Wide (BOA);Head turns;Foam/compliant surface;Other reps (comment);30  secs;Limitations    Standing Eyes Closed Limitations Standing on 2 pillows in corner, UE support, head turns/nods x 10 reps    Stepping Strategy Foam/compliant surface;Anterior;Posterior;10 reps;Lateral   on blue foam beam   Balance Beam Heel/toe raises x 10 reps no UE support in bars.  Minisquats x 10 reps, then minisquats>heel raises x 10 reps.    Tandem Gait Forward;Retro;Upper extremity support;Foam/compliant surface;3 reps;Limitations    Tandem Gait Limitations on blue foam beam for 3 laps each way with light UE support on bars    Partial Tandem Stance Eyes open;Intermittent upper extremity support;5 reps   Head turns, head nods   Other Standing Exercises In parallel bars, resisted forward gait x 5 reps, then resisted side stepping x 3 reps each direction, with PT providing resistance, cues to slow pace and return to start position with less resistance.  No LOB, but hesitant towards end range of resistance.             PT Education - 01/26/20 2039    Education Details Discussed PT plans for d/c at end of current scheduled visits (next week)    Person(s) Educated Patient    Methods Explanation    Comprehension Verbalized understanding               PT Long Term Goals - 01/07/20 2213      PT LONG TERM GOAL #1   Title Pt will verbalize plans for continued community fitness upon d/c from PT, to maximize gains made in PT.  TARGET 02/05/2020    Baseline Have initiated conversation on community fitness; pt has not followed up    Time 4    Period Weeks    Status On-going      PT LONG TERM GOAL #2   Title Pt will perform 6 MWT, at least 1250 ft, with no more than 2 point increase in pain for improved posture and efficiency with gait.    Baseline 1200 ft, 4-5/10 pain.; 01/04/2020:  1115 ft outdoors, 2>3/10 pain with gait    Time 4    Period Weeks    Status On-going      PT LONG TERM GOAL #3   Title Pt will improve gait velocity to 4 ft/sec for improved gait speed to help care  for grandchild.    Baseline 3.79 ft/sec    Time 4    Period Weeks    Status Revised      PT LONG TERM GOAL #4   Title FOTO score to improve to at least 60 % for improved overall  reported functional mobility.    Baseline 40 at eval, 53% 01/06/2020    Time 4    Period Weeks    Status Revised      PT LONG TERM GOAL #5   Title --    Baseline --    Time --    Period --    Status --                 Plan - 01/26/20 2041    Clinical Impression Statement Pt had fall over the weekend on small pea-gravel outside, so focus of today's session was compliant surface standing and dynamic balance.  Cues for foot placement, as pt does have tendency to invert L foot at times (she is no longer wearing foot-up brace).  With activities today, she needs very little UE support, which is improved from compliant surface and single limb activities in sessions past.  She will continue to benefit from skilled PT towards LTGs, with anticipated d/c next week, at end of current POC.    Personal Factors and Comorbidities Comorbidity 3+    Comorbidities PMH:  diabetes, hyperlipidemia, smoking, breast cancer, R carotid stenosis requiring emergent thrombectomy, anxiety depression    Examination-Activity Limitations Locomotion Level;Transfers;Reach Overhead;Stand;Stairs;Lift;Dressing    Examination-Participation Restrictions Community Activity;Other   caring for grandchild   Stability/Clinical Decision Making Evolving/Moderate complexity    Rehab Potential Good    PT Frequency 2x / week    PT Duration 4 weeks   per recert 8/34/1962   PT Treatment/Interventions ADLs/Self Care Home Management;Electrical Stimulation;DME Instruction;Gait training;Stair training;Functional mobility training;Therapeutic activities;Therapeutic exercise;Balance training;Neuromuscular re-education;Manual techniques;Orthotic Fit/Training;Patient/family education;Passive range of motion;Aquatic Therapy    PT Next Visit Plan Continue towards  LTGs; try long distance gait and 6MWT; consider adding corner balance/compliant surface ex to HEP; continue to work towards transition to community fitness.    PT Home Exercise Plan Access Code: [email or web address]    Consulted and Agree with Plan of Care Patient           Patient will benefit from skilled therapeutic intervention in order to improve the following deficits and impairments:  Abnormal gait, Decreased coordination, Difficulty walking, Impaired tone, Impaired UE functional use, Decreased activity tolerance, Decreased balance, Decreased mobility, Decreased strength, Postural dysfunction  Visit Diagnosis: Unsteadiness on feet  Other abnormalities of gait and mobility  Muscle weakness (generalized)     Problem List Patient Active Problem List   Diagnosis Date Noted  . Lymph node enlargement   . Cellulitis of groin, right   . Cellulitis   . Sepsis ([place]) 11/20/2019  . Acute inguinal lymphadenitis   . Vascular headache   . Other chronic pain   . Uncontrolled type 2 diabetes mellitus with hyperglycemia ([place])   . Labile blood glucose   . Panic disorder with agoraphobia and moderate panic attacks   . Major depressive disorder, recurrent episode, moderate ([place])   . MCI (mild cognitive impairment) with memory loss   . Acute ischemic right middle cerebral artery (MCA) stroke ([place]) 03/09/2019  . Left hemiparesis ([place])   . Bradycardia   . Tachypnea   . SIRS (systemic inflammatory response syndrome) (HCC)   . Hypokalemia   . Diabetes mellitus type 2 in obese ([place])   . Tobacco abuse   . History of breast cancer   . Cognitive deficits   . Dysphagia, post-stroke   . Stroke (cerebrum) ([place]) 03/02/2019  . Middle cerebral artery embolism, right 03/02/2019  . Chest pain 12/19/2015  . Numbness 12/19/2015  .  Femoral hernia 03/24/2014  . Dyslipidemia 09/10/2011  . BRCA1 positive 09/10/2011  . Breast cancer ([place]) 09/10/2011  . H/O gastric bypass; 05/14/11([facility]) 09/10/2011  . Essential  hypertension 10/28/2008  . DEPRESSION/ANXIETY 02/19/2007  . CARPAL TUNNEL SYNDROME, RIGHT 02/19/2007  . ALLERGIC RHINITIS 02/19/2007  . GERD 02/19/2007  . IRRITABLE BOWEL SYNDROME 02/19/2007  . HERPES GENITALIS 12/30/2006  . Diabetes ([place]) 12/30/2006  . OSTEOPENIA 12/30/2006  . ANOMALY, CONGENITAL, NERVOUS SYSTEM NEC 12/30/2006    Ann Lopez W. 01/26/2020, 8:45 PM Frazier Butt., PT  [facility] [address] [place], [place], [address] Phone: [phone]   Fax:  [phone]  Name: Ann Lopez MRN: [id number] Date of Birth: [birth year]

## 2020-01-26 NOTE — Patient Instructions (Signed)
° ° ° ° ° °  Scapular Retraction (Prone)   Lie with arms at sides. Pinch shoulder blades together and raise arms a few inches from floor.  (thumbs down) Repeat 10 times per set. Do 1 sessions per day.   OTHER: Scapular Protraction    Use shoulder muscles to lift chest off floor. Do not lift hips. Hold 5 seconds. 10 reps per set, 1 sets per day  All Fours Shoulder Flexion / Extension    Place hands and knees shoulder-width apart, rock back and sit on legs, then rock forward over hands and forearms. Repeat 10 times slowly. Do 1 sessions per day.         SITTING: Reach Across Body    Weight bear on right hand. Reach across body with other hand to reach target.   _10__ reps Hold 10sec per set, _1__ sets per day Do slowly w/ support to Right hand/elbow provided by caregiver.

## 2020-01-26 NOTE — Therapy (Addendum)
[facility] [address] [place], [place], [address] Phone: [phone]   Fax:  [phone]  Occupational Therapy Treatment  Patient Details  Name: Ann Lopez MRN: [id number] Date of Birth: 09/23/1970 Referring Provider (OT): Dr. Leeroy Cha   Encounter Date: 01/26/2020   OT End of Session - 01/26/20 1236    Visit Number 24    Number of Visits 33    Date for OT Re-Evaluation 02/15/20    Authorization Type UHC Medicare/ Medicaid, covered 100% (medicare guidelines)    Authorization Time Period cert date 04/12/46-18/5/63    Authorization - Number of Visits 24    Progress Note Due on Visit 30    OT Start Time 1234    OT Stop Time 1315    OT Time Calculation (min) 41 min    Activity Tolerance Patient tolerated treatment well    Behavior During Therapy [facility] for tasks assessed/performed           Past Medical History:  Diagnosis Date  . Allergy   . Anxiety   . Asthma   . Back pain   . Breast cancer ([place])   . Cancer ([place])   . Depression   . Diabetes mellitus   . Headache   . Hyperlipemia   . Osteopenia   . Osteoporosis    osteopenia  . Stroke [facility])     Past Surgical History:  Procedure Laterality Date  . ABDOMINAL HYSTERECTOMY     B oophorectomy for BRCA1 gene  . BREAST SURGERY    . CESAREAN SECTION    . INCISION AND DRAINAGE ABSCESS Left 11/14/2012   Procedure: INCISION AND DRAINAGE ABSCESS LEFT GROIN;  Surgeon: Jamesetta So, MD;  Location: [facility];  Service: General;  Laterality: Left;  . INGUINAL LYMPH NODE BIOPSY Right 11/23/2019   Procedure: INGUINAL LYMPH NODE BIOPSY;  Surgeon: Aviva Signs, MD;  Location: [facility];  Service: General;  Laterality: Right;  . IR ANGIO VERTEBRAL SEL SUBCLAVIAN INNOMINATE UNI R MOD SED  03/02/2019  . IR CT HEAD LTD  03/02/2019  . IR INTRAVSC STENT CERV CAROTID W/O EMB-PROT MOD SED INC ANGIO  03/02/2019  . IR PERCUTANEOUS ART THROMBECTOMY/INFUSION INTRACRANIAL INC  DIAG ANGIO  03/02/2019  . LYMPHADENECTOMY    . MASTECTOMY Bilateral   . RADIOLOGY WITH ANESTHESIA N/A 03/02/2019   Procedure: IR WITH ANESTHESIA;  Surgeon: Luanne Bras, MD;  Location: [facility];  Service: Radiology;  Laterality: N/A;    There were no vitals filed for this visit.   Subjective Assessment - 01/26/20 1526    Subjective  no pain, it is so much better, but I want to be able to use it to drive, reach in cabinets, turn bra around    Patient is accompanied by: Family member    Pertinent History R MCA  stroke with L shoulder pain.  PMH:  diabetes, anxiety, depression hyperlipidemia, smoking, breast cancer s/p bilateral mastectomy followed by chmo in 2009, R carotid stenosis requiring emergent thrombectomy    Limitations fall risk, pain    Patient Stated Goals improve L shoudler pain and ROM, be able to care for grandchild alone    Currently in Pain? No/denies    Pain Onset 1 to 4 weeks ago              Ultrasound to L lateral shoulder/deltoid due to pain/tightness, 1.0wts/cm2, 1.57mz, continuous with no adverse reactions x839m.  Supine, gentle joint mobs to L shoulder and soft tissue  mobs.  Supine, AAROM shoulder flex, circumduction,  Sidelying, scapular retraction, depression, shoulder ER, shoulder flex with scapular facilitation for incr shoulder mobility.           OT Education - 01/26/20 1527    Education Details Scapular HEP--see pt instructions    Person(s) Educated Patient    Methods Explanation;Demonstration;Verbal cues;Handout    Comprehension Verbalized understanding;Returned demonstration;Verbal cues required            OT Short Term Goals - 01/14/20 1529      OT SHORT TERM GOAL #1   Title Pt will be independent with initial HEP.--check STGs 11/13/19 (extended due to scheduling)    Time 4    Period Weeks    Status Achieved      OT SHORT TERM GOAL #2   Title Pt will demo at least 70* L shoulder flexion for functional reaching without pain.     Baseline 40*    Time 4    Period Weeks    Status Achieved   10/29/19:  approx 90*     OT SHORT TERM GOAL #3   Title Pt will verbalize understanding of proper positioning of LUE for decr pain.    Time 4    Period Weeks    Status Achieved      OT SHORT TERM GOAL #4   Title Pt will be able to use LUE in simple light home maintenance tasks as non-dominant assist at least 50% of the time with pain less than 3/10 (wiping table, folding clothes, sweeping).    Time 4    Period Weeks    Status Achieved      OT SHORT TERM GOAL #5   Title Pt will demo improved LUE functional reaching for ADLs as shown by improving score on box and blocks test by at least 6.    Baseline 28 blocks    Time 4    Period Weeks    Status Achieved   36     OT SHORT TERM GOAL #6   Title Pt will verbalize understanding of adaptive strategies/AE to incr independence with cooking tasks.    Time 4    Period Weeks    Status Achieved             OT Long Term Goals - 01/14/20 1529      OT LONG TERM GOAL #1   Title Pt will be independent with updated HEP for LUE--check LTGs 11/24/19    Status On-going      OT LONG TERM GOAL #2   Title Pt will demo at least 90* L shoulder flex without pain for functional reaching.    Baseline 40* initally. 80* 12/15/19 seated.  90* supine 12/15/19    Time 8    Period Weeks    Status On-going      OT LONG TERM GOAL #3   Title Pt will be able to use LUE as nondominant assist for functional tasks at least 75% of the time for light ADLs and IADLs with pain consistently less than or equal to 2/10.    Baseline less than 50%  01/06/20    Time 8    Period Weeks    Status On-going      OT LONG TERM GOAL #4   Title Pt will improve L grip strength by at least 8lbs to assist in opening containers/home maintenance tasks.    Baseline 42.7lbs initially, 46.7 12/15/19    Time 8    Period  Weeks    Status On-going      OT LONG TERM GOAL #5   Title Pt will improve LUE coordination as shown by  improving time on 9-hole peg test by at least 8sec.    Baseline 40.81sec initally, 38.78 12/15/19    Time 8    Period Weeks    Status On-going      OT LONG TERM GOAL #6   Title Pt will improve LUE functional reaching for ADLs as shown by improving score on box and blocks test by at least 10.    Baseline 28 initially, 39 - 12/15/19    Time 8    Period Weeks    Status Achieved                 Plan - 01/26/20 1236    Clinical Impression Statement Pt tolerated progression of HEP/activity well today very minimal to no pain (min cueing for positioning/adjustment eliminated pain)    OT Occupational Profile and History Detailed Assessment- Review of Records and additional review of physical, cognitive, psychosocial history related to current functional performance    Occupational performance deficits (Please refer to evaluation for details): IADL's;ADL's;Leisure    Body Structure / Function / Physical Skills ADL;Balance;IADL;ROM;Strength;Tone;FMC;Coordination;UE functional use;Decreased knowledge of precautions;GMC;Decreased knowledge of use of DME;Pain    Rehab Potential Good    Clinical Decision Making Several treatment options, min-mod task modification necessary    Comorbidities Affecting Occupational Performance: May have comorbidities impacting occupational performance    Modification or Assistance to Complete Evaluation  Min-Moderate modification of tasks or assist with assess necessary to complete eval    OT Frequency 3x / week    OT Duration 8 weeks    OT Treatment/Interventions Self-care/ADL training;Moist Heat;DME and/or AE instruction;Therapeutic activities;Aquatic Therapy;Therapeutic exercise;Ultrasound;Passive range of motion;Functional Mobility Training;Neuromuscular education;Cryotherapy;Electrical Stimulation;Manual Therapy;Patient/family education    Plan ultrasound and manual prn, neuro re-ed, simple coordination HEP, functional reach with focus on shoulder positioning    OT  Home Exercise Plan supine bilateral shoulder exercises    Consulted and Agree with Plan of Care Patient           Patient will benefit from skilled therapeutic intervention in order to improve the following deficits and impairments:   Body Structure / Function / Physical Skills: ADL, Balance, IADL, ROM, Strength, Tone, FMC, Coordination, UE functional use, Decreased knowledge of precautions, GMC, Decreased knowledge of use of DME, Pain       Visit Diagnosis: Hemiplegia and hemiparesis following cerebral infarction affecting left non-dominant side (HCC)  Acute pain of left shoulder  Abnormal posture  Other lack of coordination  Other symptoms and signs involving the nervous system  Other symptoms and signs involving the musculoskeletal system    Problem List Patient Active Problem List   Diagnosis Date Noted  . Lymph node enlargement   . Cellulitis of groin, right   . Cellulitis   . Sepsis ([place]) 11/20/2019  . Acute inguinal lymphadenitis   . Vascular headache   . Other chronic pain   . Uncontrolled type 2 diabetes mellitus with hyperglycemia ([place])   . Labile blood glucose   . Panic disorder with agoraphobia and moderate panic attacks   . Major depressive disorder, recurrent episode, moderate ([place])   . MCI (mild cognitive impairment) with memory loss   . Acute ischemic right middle cerebral artery (MCA) stroke ([place]) 03/09/2019  . Left hemiparesis ([place])   . Bradycardia   . Tachypnea   . SIRS (systemic inflammatory  response syndrome) ([place])   . Hypokalemia   . Diabetes mellitus type 2 in obese ([place])   . Tobacco abuse   . History of breast cancer   . Cognitive deficits   . Dysphagia, post-stroke   . Stroke (cerebrum) ([place]) 03/02/2019  . Middle cerebral artery embolism, right 03/02/2019  . Chest pain 12/19/2015  . Numbness 12/19/2015  . Femoral hernia 03/24/2014  . Dyslipidemia 09/10/2011  . BRCA1 positive 09/10/2011  . Breast cancer ([place]) 09/10/2011  . H/O  gastric bypass; 05/14/11([facility]) 09/10/2011  . Essential hypertension 10/28/2008  . DEPRESSION/ANXIETY 02/19/2007  . CARPAL TUNNEL SYNDROME, RIGHT 02/19/2007  . ALLERGIC RHINITIS 02/19/2007  . GERD 02/19/2007  . IRRITABLE BOWEL SYNDROME 02/19/2007  . HERPES GENITALIS 12/30/2006  . Diabetes ([place]) 12/30/2006  . OSTEOPENIA 12/30/2006  . ANOMALY, CONGENITAL, NERVOUS SYSTEM NEC 12/30/2006    [facility] 01/26/2020, 3:30 PM  [facility] [address] [place], [place], [address] Phone: [phone]   Fax:  [phone]  Name: Ann Lopez MRN: [id number] Date of Birth: 02/14/1971   Vianne Bulls, OTR/L [facility] [address] [place], [place]  [address] [phone] phone [phone] 01/26/20 3:30 PM

## 2020-01-28 ENCOUNTER — Ambulatory Visit: Payer: Medicare Other | Admitting: Occupational Therapy

## 2020-01-28 ENCOUNTER — Ambulatory Visit: Payer: Medicare Other | Admitting: Physical Therapy

## 2020-01-28 ENCOUNTER — Encounter: Payer: Self-pay | Admitting: Occupational Therapy

## 2020-01-28 ENCOUNTER — Other Ambulatory Visit: Payer: Self-pay

## 2020-01-28 DIAGNOSIS — R29818 Other symptoms and signs involving the nervous system: Secondary | ICD-10-CM

## 2020-01-28 DIAGNOSIS — R29898 Other symptoms and signs involving the musculoskeletal system: Secondary | ICD-10-CM

## 2020-01-28 DIAGNOSIS — R278 Other lack of coordination: Secondary | ICD-10-CM

## 2020-01-28 DIAGNOSIS — M6281 Muscle weakness (generalized): Secondary | ICD-10-CM

## 2020-01-28 DIAGNOSIS — R293 Abnormal posture: Secondary | ICD-10-CM

## 2020-01-28 DIAGNOSIS — M25512 Pain in left shoulder: Secondary | ICD-10-CM

## 2020-01-28 DIAGNOSIS — R2681 Unsteadiness on feet: Secondary | ICD-10-CM

## 2020-01-28 DIAGNOSIS — R2689 Other abnormalities of gait and mobility: Secondary | ICD-10-CM

## 2020-01-28 DIAGNOSIS — I69354 Hemiplegia and hemiparesis following cerebral infarction affecting left non-dominant side: Secondary | ICD-10-CM

## 2020-01-28 NOTE — Patient Instructions (Signed)
  Corner Balance Exercise, with chair in front for support:  Standing on pillows or towel with feet apart, feet together, or partial heel to toe stance:   -Head turns x 10  -Head nods x 10  -You can do this with eyes open and with eyes closed   -Marching in place x 10  -Heel/toe raises x 10  -Forward step taps x 10  -Side step taps x 10  Make sure to pay attention to your L foot placement and avoid turning that foot in.

## 2020-01-28 NOTE — Patient Instructions (Signed)
    Basic Activities:   Use your affected hand to perform the following activities for 20-30 minutes 1 times/day.  Stop activity if you experience pain.  - Wipe table top - Bring plastic cup to mouth, then return to table top and release - Flip playing cards, turn palm up and try not to lean body - Toss ball - Rotate ball in your hand  - Pick up coins until you get 5 in you hand then move from palm to fingertips and place in a container or coin bank - Stack coins - Put empty clothes hangers on a rack, remove, and repeat - Deal cards with your thumb (hold deck in left hand) - Sweep

## 2020-01-28 NOTE — Therapy (Signed)
[facility] [address] [place], [place], [address] Phone: [phone]   Fax:  [phone]  Occupational Therapy Treatment  Patient Details  Name: Ann Lopez MRN: [id number] Date of Birth: [birth year] Referring Provider (OT): Dr. Leeroy Cha   Encounter Date: 01/28/2020   OT End of Session - 01/28/20 1237    Visit Number 25    Number of Visits 33    Date for OT Re-Evaluation 02/15/20    Authorization Type UHC Medicare/ Medicaid, covered 100% (medicare guidelines)    Authorization Time Period cert date 08/12/36-75/6/43    Authorization - Number of Visits 25    Progress Note Due on Visit 30    OT Start Time 1233    OT Stop Time 1315    OT Time Calculation (min) 42 min    Activity Tolerance Patient tolerated treatment well    Behavior During Therapy [facility] for tasks assessed/performed           Past Medical History:  Diagnosis Date  . Allergy   . Anxiety   . Asthma   . Back pain   . Breast cancer ([place])   . Cancer ([place])   . Depression   . Diabetes mellitus   . Headache   . Hyperlipemia   . Osteopenia   . Osteoporosis    osteopenia  . Stroke [facility])     Past Surgical History:  Procedure Laterality Date  . ABDOMINAL HYSTERECTOMY     B oophorectomy for BRCA1 gene  . BREAST SURGERY    . CESAREAN SECTION    . INCISION AND DRAINAGE ABSCESS Left 11/14/2012   Procedure: INCISION AND DRAINAGE ABSCESS LEFT GROIN;  Surgeon: Jamesetta So, MD;  Location: [facility];  Service: General;  Laterality: Left;  . INGUINAL LYMPH NODE BIOPSY Right 11/23/2019   Procedure: INGUINAL LYMPH NODE BIOPSY;  Surgeon: Aviva Signs, MD;  Location: [facility];  Service: General;  Laterality: Right;  . IR ANGIO VERTEBRAL SEL SUBCLAVIAN INNOMINATE UNI R MOD SED  03/02/2019  . IR CT HEAD LTD  03/02/2019  . IR INTRAVSC STENT CERV CAROTID W/O EMB-PROT MOD SED INC ANGIO  03/02/2019  . IR PERCUTANEOUS ART THROMBECTOMY/INFUSION INTRACRANIAL INC  DIAG ANGIO  03/02/2019  . LYMPHADENECTOMY    . MASTECTOMY Bilateral   . RADIOLOGY WITH ANESTHESIA N/A 03/02/2019   Procedure: IR WITH ANESTHESIA;  Surgeon: Luanne Bras, MD;  Location: [facility];  Service: Radiology;  Laterality: N/A;    There were no vitals filed for this visit.   Subjective Assessment - 01/28/20 1424    Subjective  Pt reports mild pain with holding LUE in low range, but no significant pain with therapy tasks (stayed within pain free range)    Patient is accompanied by: Family member    Pertinent History R MCA  stroke with L shoulder pain.  PMH:  diabetes, anxiety, depression hyperlipidemia, smoking, breast cancer s/p bilateral mastectomy followed by chmo in 2009, R carotid stenosis requiring emergent thrombectomy    Limitations fall risk, pain    Patient Stated Goals improve L shoudler pain and ROM, be able to care for grandchild alone    Currently in Pain? No/denies    Pain Onset 1 to 4 weeks ago              Checked progress towards goals and discussed focus of OT for incr LUE functional use with incr normal movement patterns as well as incr scapular stabilization.  Pt verbalized understanding (  see below for updates).  Ultrasound to L lateral shoulder/deltoid due to pain/tightness, 1.0wts/cm2, 1.72mz, continuous with no adverse reactions x852m.          OT Education - 01/28/20 1425    Education Details LUE coordination/functional use HEP    Person(s) Educated Patient    Methods Explanation;Demonstration;Verbal cues;Handout    Comprehension Verbalized understanding;Returned demonstration;Verbal cues required            OT Short Term Goals - 01/14/20 1529      OT SHORT TERM GOAL #1   Title Pt will be independent with initial HEP.--check STGs 11/13/19 (extended due to scheduling)    Time 4    Period Weeks    Status Achieved      OT SHORT TERM GOAL #2   Title Pt will demo at least 70* L shoulder flexion for functional reaching without pain.     Baseline 40*    Time 4    Period Weeks    Status Achieved   10/29/19:  approx 90*     OT SHORT TERM GOAL #3   Title Pt will verbalize understanding of proper positioning of LUE for decr pain.    Time 4    Period Weeks    Status Achieved      OT SHORT TERM GOAL #4   Title Pt will be able to use LUE in simple light home maintenance tasks as non-dominant assist at least 50% of the time with pain less than 3/10 (wiping table, folding clothes, sweeping).    Time 4    Period Weeks    Status Achieved      OT SHORT TERM GOAL #5   Title Pt will demo improved LUE functional reaching for ADLs as shown by improving score on box and blocks test by at least 6.    Baseline 28 blocks    Time 4    Period Weeks    Status Achieved   36     OT SHORT TERM GOAL #6   Title Pt will verbalize understanding of adaptive strategies/AE to incr independence with cooking tasks.    Time 4    Period Weeks    Status Achieved             OT Long Term Goals - 01/28/20 1251      OT LONG TERM GOAL #1   Title Pt will be independent with updated HEP for LUE--check LTGs 11/24/19    Status On-going      OT LONG TERM GOAL #2   Title Pt will demo at least 90* L shoulder flex without pain for functional reaching.--UPGRADED to 100* 01/28/20    Baseline 40* initally. 80* 12/15/19 seated.  90* supine 12/15/19    Time 8    Period Weeks    Status Revised   01/28/20:  90*     OT LONG TERM GOAL #3   Title Pt will be able to use LUE as nondominant assist for functional tasks at least 75% of the time for light ADLs and IADLs with pain consistently less than or equal to 2/10.    Baseline less than 50%  01/06/20    Time 8    Period Weeks    Status On-going   01/28/20:  50% per pt report with pain consistently 2/10 (up to 7/10 with attempts at high reaching)     OT LONG TERM GOAL #4   Title Pt will improve L grip strength by at least 8lbs to  assist in opening containers/home maintenance tasks.    Baseline 42.7lbs  initially, 46.7 12/15/19    Time 8    Period Weeks    Status On-going   01/28/20:  43.6lbs     OT LONG TERM GOAL #5   Title Pt will improve LUE coordination as shown by improving time on 9-hole peg test by at least 8sec.    Baseline 40.81sec initally, 38.78 12/15/19    Time 8    Period Weeks    Status On-going   01/28/20:  38.59sec     OT LONG TERM GOAL #6   Title Pt will improve LUE functional reaching for ADLs as shown by improving score on box and blocks test by at least 10.    Baseline 28 initially, 39 - 12/15/19    Time 8    Period Weeks    Status Achieved                 Plan - 01/28/20 1237    Clinical Impression Statement LTG #2 met and upgraded.  Pt continues to demo compensation patterns with LUE functional use that may be contributing to pain; therefore emphasized movement quality and decr flexor synergy movement patterns with functional tasks and added to HEP.  Pt verbalized understanding.    OT Occupational Profile and History Detailed Assessment- Review of Records and additional review of physical, cognitive, psychosocial history related to current functional performance    Occupational performance deficits (Please refer to evaluation for details): IADL's;ADL's;Leisure    Body Structure / Function / Physical Skills ADL;Balance;IADL;ROM;Strength;Tone;FMC;Coordination;UE functional use;Decreased knowledge of precautions;GMC;Decreased knowledge of use of DME;Pain    Rehab Potential Good    Clinical Decision Making Several treatment options, min-mod task modification necessary    Comorbidities Affecting Occupational Performance: May have comorbidities impacting occupational performance    Modification or Assistance to Complete Evaluation  Min-Moderate modification of tasks or assist with assess necessary to complete eval    OT Frequency 3x / week    OT Duration 8 weeks    OT Treatment/Interventions Self-care/ADL training;Moist Heat;DME and/or AE instruction;Therapeutic  activities;Aquatic Therapy;Therapeutic exercise;Ultrasound;Passive range of motion;Functional Mobility Training;Neuromuscular education;Cryotherapy;Electrical Stimulation;Manual Therapy;Patient/family education    Plan review scapular stabilization HEP;  functional reach with focus on shoulder positioning    OT Home Exercise Plan supine bilateral shoulder exercises    Consulted and Agree with Plan of Care Patient           Patient will benefit from skilled therapeutic intervention in order to improve the following deficits and impairments:   Body Structure / Function / Physical Skills: ADL, Balance, IADL, ROM, Strength, Tone, FMC, Coordination, UE functional use, Decreased knowledge of precautions, GMC, Decreased knowledge of use of DME, Pain       Visit Diagnosis: Hemiplegia and hemiparesis following cerebral infarction affecting left non-dominant side (HCC)  Acute pain of left shoulder  Abnormal posture  Other lack of coordination  Other symptoms and signs involving the nervous system  Other symptoms and signs involving the musculoskeletal system  Unsteadiness on feet    Problem List Patient Active Problem List   Diagnosis Date Noted  . Lymph node enlargement   . Cellulitis of groin, right   . Cellulitis   . Sepsis ([place]) 11/20/2019  . Acute inguinal lymphadenitis   . Vascular headache   . Other chronic pain   . Uncontrolled type 2 diabetes mellitus with hyperglycemia ([place])   . Labile blood glucose   . Panic disorder with agoraphobia  and moderate panic attacks   . Major depressive disorder, recurrent episode, moderate ([place])   . MCI (mild cognitive impairment) with memory loss   . Acute ischemic right middle cerebral artery (MCA) stroke ([place]) 03/09/2019  . Left hemiparesis ([place])   . Bradycardia   . Tachypnea   . SIRS (systemic inflammatory response syndrome) (HCC)   . Hypokalemia   . Diabetes mellitus type 2 in obese ([place])   . Tobacco abuse   . History of breast  cancer   . Cognitive deficits   . Dysphagia, post-stroke   . Stroke (cerebrum) ([place]) 03/02/2019  . Middle cerebral artery embolism, right 03/02/2019  . Chest pain 12/19/2015  . Numbness 12/19/2015  . Femoral hernia 03/24/2014  . Dyslipidemia 09/10/2011  . BRCA1 positive 09/10/2011  . Breast cancer ([place]) 09/10/2011  . H/O gastric bypass; 05/14/11([facility]) 09/10/2011  . Essential hypertension 10/28/2008  . DEPRESSION/ANXIETY 02/19/2007  . CARPAL TUNNEL SYNDROME, RIGHT 02/19/2007  . ALLERGIC RHINITIS 02/19/2007  . GERD 02/19/2007  . IRRITABLE BOWEL SYNDROME 02/19/2007  . HERPES GENITALIS 12/30/2006  . Diabetes ([place]) 12/30/2006  . OSTEOPENIA 12/30/2006  . ANOMALY, CONGENITAL, NERVOUS SYSTEM NEC 12/30/2006    [facility] 01/28/2020, 2:28 PM  [facility] [address] [place], [place], [address] Phone: [phone]   Fax:  [phone]  Name: Ann Lopez MRN: [id number] Date of Birth: 1970-12-30   Vianne Bulls, OTR/L [facility] [address] [place], [place]  [address] [phone] phone [phone] 01/28/20 2:28 PM

## 2020-01-29 ENCOUNTER — Encounter: Payer: Medicare Other | Admitting: Physical Medicine and Rehabilitation

## 2020-01-29 NOTE — Therapy (Signed)
[facility] [address] [place], [place], [address] Phone: [phone]   Fax:  [phone]  Physical Therapy Treatment  Patient Details  Name: Ann Lopez MRN: [id number] Date of Birth: Feb 09, 1971 Referring Provider (PT): Dr. Leeroy Cha   Encounter Date: 01/28/2020   PT End of Session - 01/29/20 1222    Visit Number 38    Number of Visits 41    Date for PT Re-Evaluation 42/87/68   60 day recert for 4 wk POC   Authorization Type [facility] Medicare/Medicaid    Progress Note Due on Visit 43   PN completed 01/06/2020, visit 33   PT Start Time 1321    PT Stop Time 1400    PT Time Calculation (min) 39 min    Activity Tolerance Patient tolerated treatment well    Behavior During Therapy [facility] for tasks assessed/performed;Flat affect           Past Medical History:  Diagnosis Date  . Allergy   . Anxiety   . Asthma   . Back pain   . Breast cancer ([place])   . Cancer ([place])   . Depression   . Diabetes mellitus   . Headache   . Hyperlipemia   . Osteopenia   . Osteoporosis    osteopenia  . Stroke [facility])     Past Surgical History:  Procedure Laterality Date  . ABDOMINAL HYSTERECTOMY     B oophorectomy for BRCA1 gene  . BREAST SURGERY    . CESAREAN SECTION    . INCISION AND DRAINAGE ABSCESS Left 11/14/2012   Procedure: INCISION AND DRAINAGE ABSCESS LEFT GROIN;  Surgeon: Jamesetta So, MD;  Location: [facility];  Service: General;  Laterality: Left;  . INGUINAL LYMPH NODE BIOPSY Right 11/23/2019   Procedure: INGUINAL LYMPH NODE BIOPSY;  Surgeon: Aviva Signs, MD;  Location: [facility];  Service: General;  Laterality: Right;  . IR ANGIO VERTEBRAL SEL SUBCLAVIAN INNOMINATE UNI R MOD SED  03/02/2019  . IR CT HEAD LTD  03/02/2019  . IR INTRAVSC STENT CERV CAROTID W/O EMB-PROT MOD SED INC ANGIO  03/02/2019  . IR PERCUTANEOUS ART THROMBECTOMY/INFUSION INTRACRANIAL INC DIAG ANGIO  03/02/2019  . LYMPHADENECTOMY    . MASTECTOMY  Bilateral   . RADIOLOGY WITH ANESTHESIA N/A 03/02/2019   Procedure: IR WITH ANESTHESIA;  Surgeon: Luanne Bras, MD;  Location: [facility];  Service: Radiology;  Laterality: N/A;    There were no vitals filed for this visit.   Subjective Assessment - 01/28/20 1317    Subjective Not really having any pain today.  No more stumbles.  Just getting frustrated (at slow progress)    Pertinent History TLX:BWIOMBTD, hyperlipidemia, smoking, breast cancer, R carotid stenosis requiring emergent thrombectomy; 11/23/2019:  surgery to remove infected lymph node R groin    Patient Stated Goals Pt wants all the movement back in L side.    Currently in Pain? No/denies    Pain Onset In the past 7 days                             [facility] Adult PT Treatment/Exercise - 01/28/20 1320      Ambulation/Gait   Ambulation/Gait Yes    Ambulation/Gait Assistance 5: Supervision    Ambulation/Gait Assistance Details Gait outdoors, 6MWT, on sidewalk surfaces, 1231 ft no device    Ambulation Distance (Feet) 1500 Feet   total   Assistive device None  Gait Pattern Step-through pattern;Decreased stance time - left;Decreased arm swing - left;Trendelenburg;Lateral trunk lean to left    Ambulation Surface Level;Indoor;Unlevel;Outdoor;Paved      Knee/Hip Exercises: Aerobic   Nustep BLES and RUE, Level 5, x 8 minutes, cues for positioning with LLE to avoid hip external rotation               Balance Exercises - 01/28/20 1325      Balance Exercises: Standing   Standing Eyes Opened Narrow base of support (BOS);Foam/compliant surface;Other reps (comment)    Standing Eyes Opened Limitations Standing on 2 pillows in corner, chair in front for support as needed; head turns and nods x 10    Standing Eyes Closed Narrow base of support (BOS);Wide (BOA);Head turns;Foam/compliant surface;Other reps (comment);30 secs;Limitations    Standing Eyes Closed Limitations Standing on 2 pillows in corner, UE support,  head turns/nods x 10 reps    Partial Tandem Stance Eyes open;Intermittent upper extremity support;5 reps   Head turns/head nods; eyes closed x 10 sec head steady   Other Standing Exercises On Airex:  marching in place x 10 reps, alt step taps x 10 reps, heel/toe raises x 10 reps, alt side step taps x 10 reps, cues for L foot placement, avoiding extra supination              PT Education - 01/29/20 1221    Education Details Updates to HEP for balance    Person(s) Educated Patient    Methods Explanation;Demonstration;Handout    Comprehension Verbalized understanding;Returned demonstration               PT Long Term Goals - 01/29/20 1222      PT LONG TERM GOAL #1   Title Pt will verbalize plans for continued community fitness upon d/c from PT, to maximize gains made in PT.  TARGET 02/05/2020    Baseline Have initiated conversation on community fitness; pt has not followed up    Time 4    Period Weeks    Status On-going      PT LONG TERM GOAL #2   Title Pt will perform 6 MWT, at least 1250 ft, with no more than 2 point increase in pain for improved posture and efficiency with gait.    Baseline 1200 ft, 4-5/10 pain.; 01/04/2020:  1115 ft outdoors, 2>3/10 pain with gait; outdoors 1231 ft 01/28/2020    Time 4    Period Weeks    Status On-going      PT LONG TERM GOAL #3   Title Pt will improve gait velocity to 4 ft/sec for improved gait speed to help care for grandchild.    Baseline 3.79 ft/sec    Time 4    Period Weeks    Status Revised      PT LONG TERM GOAL #4   Title FOTO score to improve to at least 60 % for improved overall reported functional mobility.    Baseline 40 at eval, 53% 01/06/2020    Time 4    Period Weeks    Status Revised                 Plan - 01/29/20 1224    Clinical Impression Statement Pt doing a good job this week with gait activities indoors and outdoor surfaces with little to no increase in pain.  She is progressing towards goal of 6MWT  distance 1250 ft (outdoors this visit 1231 ft).  Updated HEP to include compliant surface exercises, given  pt's fall earlier this week.  She is progressing towards LTGs and anticipate d/c from PT next week.    Personal Factors and Comorbidities Comorbidity 3+    Comorbidities PMH:  diabetes, hyperlipidemia, smoking, breast cancer, R carotid stenosis requiring emergent thrombectomy, anxiety depression    Examination-Activity Limitations Locomotion Level;Transfers;Reach Overhead;Stand;Stairs;Lift;Dressing    Examination-Participation Restrictions Community Activity;Other   caring for grandchild   Stability/Clinical Decision Making Evolving/Moderate complexity    Rehab Potential Good    PT Frequency 2x / week    PT Duration 4 weeks   per recert 1/54/0086   PT Treatment/Interventions ADLs/Self Care Home Management;Electrical Stimulation;DME Instruction;Gait training;Stair training;Functional mobility training;Therapeutic activities;Therapeutic exercise;Balance training;Neuromuscular re-education;Manual techniques;Orthotic Fit/Training;Patient/family education;Passive range of motion;Aquatic Therapy    PT Next Visit Plan Check LTGs; review HEP and plan for d/c next week    PT Home Exercise Plan Access Code: [email or web address]    Consulted and Agree with Plan of Care Patient           Patient will benefit from skilled therapeutic intervention in order to improve the following deficits and impairments:  Abnormal gait, Decreased coordination, Difficulty walking, Impaired tone, Impaired UE functional use, Decreased activity tolerance, Decreased balance, Decreased mobility, Decreased strength, Postural dysfunction  Visit Diagnosis: Other abnormalities of gait and mobility  Unsteadiness on feet  Muscle weakness (generalized)     Problem List Patient Active Problem List   Diagnosis Date Noted  . Lymph node enlargement   . Cellulitis of groin, right   . Cellulitis   . Sepsis ([place]) 11/20/2019  . Acute  inguinal lymphadenitis   . Vascular headache   . Other chronic pain   . Uncontrolled type 2 diabetes mellitus with hyperglycemia ([place])   . Labile blood glucose   . Panic disorder with agoraphobia and moderate panic attacks   . Major depressive disorder, recurrent episode, moderate ([place])   . MCI (mild cognitive impairment) with memory loss   . Acute ischemic right middle cerebral artery (MCA) stroke ([place]) 03/09/2019  . Left hemiparesis ([place])   . Bradycardia   . Tachypnea   . SIRS (systemic inflammatory response syndrome) ([facility])   . Hypokalemia   . Diabetes mellitus type 2 in obese ([place])   . Tobacco abuse   . History of breast cancer   . Cognitive deficits   . Dysphagia, post-stroke   . Stroke (cerebrum) ([place]) 03/02/2019  . Middle cerebral artery embolism, right 03/02/2019  . Chest pain 12/19/2015  . Numbness 12/19/2015  . Femoral hernia 03/24/2014  . Dyslipidemia 09/10/2011  . BRCA1 positive 09/10/2011  . Breast cancer ([place]) 09/10/2011  . H/O gastric bypass; 05/14/11([facility]) 09/10/2011  . Essential hypertension 10/28/2008  . DEPRESSION/ANXIETY 02/19/2007  . CARPAL TUNNEL SYNDROME, RIGHT 02/19/2007  . ALLERGIC RHINITIS 02/19/2007  . GERD 02/19/2007  . IRRITABLE BOWEL SYNDROME 02/19/2007  . HERPES GENITALIS 12/30/2006  . Diabetes ([place]) 12/30/2006  . OSTEOPENIA 12/30/2006  . ANOMALY, CONGENITAL, NERVOUS SYSTEM NEC 12/30/2006    Ann Lopez W. 01/29/2020, 12:27 PM Frazier Butt., PT  [facility] [address] [place], [place], [address] Phone: [phone]   Fax:  [phone]  Name: AMIE COWENS MRN: [id number] Date of Birth: Oct 04, 1970

## 2020-02-01 ENCOUNTER — Other Ambulatory Visit: Payer: Self-pay | Admitting: Physical Medicine and Rehabilitation

## 2020-02-02 ENCOUNTER — Ambulatory Visit: Payer: Medicare Other | Admitting: Occupational Therapy

## 2020-02-02 ENCOUNTER — Ambulatory Visit: Payer: Medicare Other | Admitting: Physical Therapy

## 2020-02-04 ENCOUNTER — Ambulatory Visit: Payer: Medicare Other | Admitting: Occupational Therapy

## 2020-02-04 ENCOUNTER — Ambulatory Visit: Payer: Medicare Other | Admitting: Physical Therapy

## 2020-02-04 ENCOUNTER — Encounter: Payer: Self-pay | Admitting: Occupational Therapy

## 2020-02-04 ENCOUNTER — Other Ambulatory Visit: Payer: Self-pay

## 2020-02-04 DIAGNOSIS — I69354 Hemiplegia and hemiparesis following cerebral infarction affecting left non-dominant side: Secondary | ICD-10-CM

## 2020-02-04 DIAGNOSIS — R293 Abnormal posture: Secondary | ICD-10-CM

## 2020-02-04 DIAGNOSIS — R2681 Unsteadiness on feet: Secondary | ICD-10-CM

## 2020-02-04 DIAGNOSIS — R278 Other lack of coordination: Secondary | ICD-10-CM

## 2020-02-04 DIAGNOSIS — R2689 Other abnormalities of gait and mobility: Secondary | ICD-10-CM

## 2020-02-04 DIAGNOSIS — M6281 Muscle weakness (generalized): Secondary | ICD-10-CM | POA: Diagnosis not present

## 2020-02-04 DIAGNOSIS — R29818 Other symptoms and signs involving the nervous system: Secondary | ICD-10-CM

## 2020-02-04 DIAGNOSIS — M25512 Pain in left shoulder: Secondary | ICD-10-CM

## 2020-02-04 DIAGNOSIS — R29898 Other symptoms and signs involving the musculoskeletal system: Secondary | ICD-10-CM

## 2020-02-04 NOTE — Therapy (Signed)
[facility] [address] [place], [place], [address] Phone: [phone]   Fax:  [phone]  Occupational Therapy Treatment  Patient Details  Name: Ann Lopez MRN: [id number] Date of Birth: [birth year] Referring Provider (OT): Dr. Leeroy Cha   Encounter Date: 02/04/2020   OT End of Session - 02/04/20 1335    Visit Number 26    Number of Visits 33    Date for OT Re-Evaluation 02/15/20    Authorization Type [employer] Medicare/ Medicaid, covered 100% (medicare guidelines)    Authorization Time Period cert date 06/12/60-94/7/65    Authorization - Number of Visits 26    Progress Note Due on Visit 30    OT Start Time 1321    OT Stop Time 1400    OT Time Calculation (min) 39 min    Activity Tolerance Patient tolerated treatment well    Behavior During Therapy [facility] for tasks assessed/performed           Past Medical History:  Diagnosis Date  . Allergy   . Anxiety   . Asthma   . Back pain   . Breast cancer ([place])   . Cancer ([place])   . Depression   . Diabetes mellitus   . Headache   . Hyperlipemia   . Osteopenia   . Osteoporosis    osteopenia  . Stroke [facility])     Past Surgical History:  Procedure Laterality Date  . ABDOMINAL HYSTERECTOMY     B oophorectomy for BRCA1 gene  . BREAST SURGERY    . CESAREAN SECTION    . INCISION AND DRAINAGE ABSCESS Left 11/14/2012   Procedure: INCISION AND DRAINAGE ABSCESS LEFT GROIN;  Surgeon: Jamesetta So, MD;  Location: [facility];  Service: General;  Laterality: Left;  . INGUINAL LYMPH NODE BIOPSY Right 11/23/2019   Procedure: INGUINAL LYMPH NODE BIOPSY;  Surgeon: Aviva Signs, MD;  Location: [facility];  Service: General;  Laterality: Right;  . IR ANGIO VERTEBRAL SEL SUBCLAVIAN INNOMINATE UNI R MOD SED  03/02/2019  . IR CT HEAD LTD  03/02/2019  . IR INTRAVSC STENT CERV CAROTID W/O EMB-PROT MOD SED INC ANGIO  03/02/2019  . IR PERCUTANEOUS ART THROMBECTOMY/INFUSION INTRACRANIAL INC  DIAG ANGIO  03/02/2019  . LYMPHADENECTOMY    . MASTECTOMY Bilateral   . RADIOLOGY WITH ANESTHESIA N/A 03/02/2019   Procedure: IR WITH ANESTHESIA;  Surgeon: Luanne Bras, MD;  Location: [facility];  Service: Radiology;  Laterality: N/A;    There were no vitals filed for this visit.   Subjective Assessment - 02/04/20 1321    Patient is accompanied by: Family member    Pertinent History R MCA  stroke with L shoulder pain.  PMH:  diabetes, anxiety, depression hyperlipidemia, smoking, breast cancer s/p bilateral mastectomy followed by chmo in 2009, R carotid stenosis requiring emergent thrombectomy    Limitations fall risk, pain    Patient Stated Goals improve L shoudler pain and ROM, be able to care for grandchild alone    Currently in Pain? No/denies    Pain Onset 1 to 4 weeks ago             Reviewed scapular stabilization HEP (scapular retraction in prone with shoulders in ext, wt. Bearing through L hand with body on arm movements, wt. Bearing in quadruped with forward/backward wt. Shifts, prone on elbows for scapular depression) with min cueing.  Sitting, closed-chain shoulder flex and diagonals with ball with BUEs for stretch with min cueing/faciliation at scapula.  AAROM shoulder flex with BUEs with ball on wall for higher range with min cueing.  Tall kneeling, AAROM shoulder flex with ball on mat rolling forward/backward with min cueing.    Arm bike x51mn level 1 for reciprocal movement with focus on elbow extension (controlled) with min cueing.          OT Short Term Goals - 01/14/20 1529      OT SHORT TERM GOAL #1   Title Pt will be independent with initial HEP.--check STGs 11/13/19 (extended due to scheduling)    Time 4    Period Weeks    Status Achieved      OT SHORT TERM GOAL #2   Title Pt will demo at least 70* L shoulder flexion for functional reaching without pain.    Baseline 40*    Time 4    Period Weeks    Status Achieved   10/29/19:  approx 90*     OT  SHORT TERM GOAL #3   Title Pt will verbalize understanding of proper positioning of LUE for decr pain.    Time 4    Period Weeks    Status Achieved      OT SHORT TERM GOAL #4   Title Pt will be able to use LUE in simple light home maintenance tasks as non-dominant assist at least 50% of the time with pain less than 3/10 (wiping table, folding clothes, sweeping).    Time 4    Period Weeks    Status Achieved      OT SHORT TERM GOAL #5   Title Pt will demo improved LUE functional reaching for ADLs as shown by improving score on box and blocks test by at least 6.    Baseline 28 blocks    Time 4    Period Weeks    Status Achieved   36     OT SHORT TERM GOAL #6   Title Pt will verbalize understanding of adaptive strategies/AE to incr independence with cooking tasks.    Time 4    Period Weeks    Status Achieved             OT Long Term Goals - 01/28/20 1251      OT LONG TERM GOAL #1   Title Pt will be independent with updated HEP for LUE--check LTGs 11/24/19    Status On-going      OT LONG TERM GOAL #2   Title Pt will demo at least 90* L shoulder flex without pain for functional reaching.--UPGRADED to 100* 01/28/20    Baseline 40* initally. 80* 12/15/19 seated.  90* supine 12/15/19    Time 8    Period Weeks    Status Revised   01/28/20:  90*     OT LONG TERM GOAL #3   Title Pt will be able to use LUE as nondominant assist for functional tasks at least 75% of the time for light ADLs and IADLs with pain consistently less than or equal to 2/10.    Baseline less than 50%  01/06/20    Time 8    Period Weeks    Status On-going   01/28/20:  50% per pt report with pain consistently 2/10 (up to 7/10 with attempts at high reaching)     OT LONG TERM GOAL #4   Title Pt will improve L grip strength by at least 8lbs to assist in opening containers/home maintenance tasks.    Baseline 42.7lbs initially, 46.7 12/15/19  Time 8    Period Weeks    Status On-going   01/28/20:  43.6lbs     OT  LONG TERM GOAL #5   Title Pt will improve LUE coordination as shown by improving time on 9-hole peg test by at least 8sec.    Baseline 40.81sec initally, 38.78 12/15/19    Time 8    Period Weeks    Status On-going   01/28/20:  38.59sec     OT LONG TERM GOAL #6   Title Pt will improve LUE functional reaching for ADLs as shown by improving score on box and blocks test by at least 10.    Baseline 28 initially, 39 - 12/15/19    Time 8    Period Weeks    Status Achieved                 Plan - 02/04/20 1335    Clinical Impression Statement Pt reports no significant pain today--just tired.  Pt is progressing with improved tolerance to wt. bearing activities and demo improving ROM.    OT Occupational Profile and History Detailed Assessment- Review of Records and additional review of physical, cognitive, psychosocial history related to current functional performance    Occupational performance deficits (Please refer to evaluation for details): IADL's;ADL's;Leisure    Body Structure / Function / Physical Skills ADL;Balance;IADL;ROM;Strength;Tone;FMC;Coordination;UE functional use;Decreased knowledge of precautions;GMC;Decreased knowledge of use of DME;Pain    Rehab Potential Good    Clinical Decision Making Several treatment options, min-mod task modification necessary    Comorbidities Affecting Occupational Performance: May have comorbidities impacting occupational performance    Modification or Assistance to Complete Evaluation  Min-Moderate modification of tasks or assist with assess necessary to complete eval    OT Frequency 3x / week    OT Duration 8 weeks    OT Treatment/Interventions Self-care/ADL training;Moist Heat;DME and/or AE instruction;Therapeutic activities;Aquatic Therapy;Therapeutic exercise;Ultrasound;Passive range of motion;Functional Mobility Training;Neuromuscular education;Cryotherapy;Electrical Stimulation;Manual Therapy;Patient/family education    Plan rows and tricep ext  with light resistance?, functional reach with focus on shoulder positioning, wt. bearing/scapular stabilization and mobilization    OT Home Exercise Plan supine bilateral shoulder exercises    Consulted and Agree with Plan of Care Patient           Patient will benefit from skilled therapeutic intervention in order to improve the following deficits and impairments:   Body Structure / Function / Physical Skills: ADL, Balance, IADL, ROM, Strength, Tone, FMC, Coordination, UE functional use, Decreased knowledge of precautions, GMC, Decreased knowledge of use of DME, Pain       Visit Diagnosis: Hemiplegia and hemiparesis following cerebral infarction affecting left non-dominant side (HCC)  Acute pain of left shoulder  Abnormal posture  Other lack of coordination  Other symptoms and signs involving the nervous system  Other symptoms and signs involving the musculoskeletal system  Unsteadiness on feet  Other abnormalities of gait and mobility    Problem List Patient Active Problem List   Diagnosis Date Noted  . Lymph node enlargement   . Cellulitis of groin, right   . Cellulitis   . Sepsis ([place]) 11/20/2019  . Acute inguinal lymphadenitis   . Vascular headache   . Other chronic pain   . Uncontrolled type 2 diabetes mellitus with hyperglycemia ([place])   . Labile blood glucose   . Panic disorder with agoraphobia and moderate panic attacks   . Major depressive disorder, recurrent episode, moderate ([place])   . MCI (mild cognitive impairment) with memory loss   .  Acute ischemic right middle cerebral artery (MCA) stroke ([place]) 03/09/2019  . Left hemiparesis ([place])   . Bradycardia   . Tachypnea   . SIRS (systemic inflammatory response syndrome) (HCC)   . Hypokalemia   . Diabetes mellitus type 2 in obese ([place])   . Tobacco abuse   . History of breast cancer   . Cognitive deficits   . Dysphagia, post-stroke   . Stroke (cerebrum) ([place]) 03/02/2019  . Middle cerebral artery  embolism, right 03/02/2019  . Chest pain 12/19/2015  . Numbness 12/19/2015  . Femoral hernia 03/24/2014  . Dyslipidemia 09/10/2011  . BRCA1 positive 09/10/2011  . Breast cancer ([place]) 09/10/2011  . H/O gastric bypass; 05/14/11([facility]) 09/10/2011  . Essential hypertension 10/28/2008  . DEPRESSION/ANXIETY 02/19/2007  . CARPAL TUNNEL SYNDROME, RIGHT 02/19/2007  . ALLERGIC RHINITIS 02/19/2007  . GERD 02/19/2007  . IRRITABLE BOWEL SYNDROME 02/19/2007  . HERPES GENITALIS 12/30/2006  . Diabetes ([place]) 12/30/2006  . OSTEOPENIA 12/30/2006  . ANOMALY, CONGENITAL, NERVOUS SYSTEM NEC 12/30/2006    [facility] 02/04/2020, 3:54 PM  [facility] [address] [place], [place], [address] Phone: [phone]   Fax:  [phone]  Name: Ann Lopez MRN: [id number] Date of Birth: 15-Oct-1970   Vianne Bulls, OTR/L [facility] [address] [place], [place]  [address] [phone] phone [phone] 02/04/20 3:54 PM

## 2020-02-04 NOTE — Patient Instructions (Addendum)
Access Code: [email or web address] URL: [email or web address] Date: 02/04/2020 Prepared by: Mady Haagensen  Exercises Supine Lower Trunk Rotation - 1 x daily - 7 x weekly - 1 sets - 3 reps - 30 sec hold Sit to stand in stride stance - 1 x daily - 7 x weekly - 1-2 sets - 10 reps Side Stepping with Resistance at Thighs and Counter Support - 1 x daily - 5 x weekly - 1 sets - 3 reps Tandem Walking with Counter Support - 1 x daily - 5 x weekly - 1 sets - 3 reps Standing Single Leg Stance with Counter Support - 1 x daily - 5 x weekly - 1 sets - 3 reps - 20 hold Step Up - 1 x daily - 5 x weekly - 2-3 sets - 10 reps Wall Quarter Squat - 1 x daily - 5 x weekly - 2-3 sets - 10 reps Heel Toe Raises with Counter Support - 1 x daily - 7 x weekly - 2-3 sets - 10 reps Standing Tandem Balance with Counter Support - 1 x daily - 7 x weekly - 1 sets - 3 reps - 15-30 hold Carioca with Counter Support - 1 x daily - 7 x weekly - 1 sets - 3-5 reps Sidelying Hip Abduction - 1 x daily - 5 x weekly - 2 sets - 10 reps

## 2020-02-05 NOTE — Therapy (Signed)
[facility] [address] [place], [place], [address] Phone: [phone]   Fax:  [phone]  Physical Therapy Treatment/Discharge Summary  Patient Details  Name: Ann Lopez MRN: [id number] Date of Birth: 10-26-1970 Referring Provider (PT): Dr. Leeroy Cha   Encounter Date: 02/04/2020   PT End of Session - 02/05/20 1438    Visit Number 39    Number of Visits 41    Date for PT Re-Evaluation 93/26/71   60 day recert for 4 wk POC   Authorization Type [employer] Medicare/Medicaid    Progress Note Due on Visit 43   PN completed 01/06/2020, visit 40   PT Start Time 1236    PT Stop Time 1310   end of d/c session, goals assesed and session completed   PT Time Calculation (min) 34 min    Activity Tolerance Patient tolerated treatment well   No increase in pain at end of session   Behavior During Therapy [facility] for tasks assessed/performed;Flat affect           Past Medical History:  Diagnosis Date   Allergy    Anxiety    Asthma    Back pain    Breast cancer ([place])    Cancer ([place])    Depression    Diabetes mellitus    Headache    Hyperlipemia    Osteopenia    Osteoporosis    osteopenia   Stroke [facility])     Past Surgical History:  Procedure Laterality Date   ABDOMINAL HYSTERECTOMY     B oophorectomy for BRCA1 gene   BREAST SURGERY     CESAREAN SECTION     INCISION AND DRAINAGE ABSCESS Left 11/14/2012   Procedure: INCISION AND DRAINAGE ABSCESS LEFT GROIN;  Surgeon: Jamesetta So, MD;  Location: [facility];  Service: General;  Laterality: Left;   INGUINAL LYMPH NODE BIOPSY Right 11/23/2019   Procedure: INGUINAL LYMPH NODE BIOPSY;  Surgeon: Aviva Signs, MD;  Location: [facility];  Service: General;  Laterality: Right;   IR ANGIO VERTEBRAL SEL SUBCLAVIAN INNOMINATE UNI R MOD SED  03/02/2019   IR CT HEAD LTD  03/02/2019   IR INTRAVSC STENT CERV CAROTID W/O EMB-PROT MOD SED INC ANGIO  03/02/2019   IR  PERCUTANEOUS ART THROMBECTOMY/INFUSION INTRACRANIAL INC DIAG ANGIO  03/02/2019   LYMPHADENECTOMY     MASTECTOMY Bilateral    RADIOLOGY WITH ANESTHESIA N/A 03/02/2019   Procedure: IR WITH ANESTHESIA;  Surgeon: Luanne Bras, MD;  Location: [facility];  Service: Radiology;  Laterality: N/A;    There were no vitals filed for this visit.   Subjective Assessment - 02/04/20 1241    Subjective Not really having any pain today.  I'm fine.  Want to go through the exercises.    Pertinent History IWP:YKDXIPJA, hyperlipidemia, smoking, breast cancer, R carotid stenosis requiring emergent thrombectomy; 11/23/2019:  surgery to remove infected lymph node R groin    Patient Stated Goals Pt wants all the movement back in L side.    Currently in Pain? No/denies    Pain Onset In the past 7 days                             [facility] Adult PT Treatment/Exercise - 02/04/20 1240      Ambulation/Gait   Ambulation/Gait Yes    Ambulation/Gait Assistance 6: Modified independent (Device/Increase time)    Ambulation/Gait Assistance Details Indoor clinic distances    Assistive  device None    Gait Pattern Step-through pattern;Decreased stance time - left;Decreased arm swing - left;Trendelenburg;Lateral trunk lean to left    Ambulation Surface Level;Indoor    Gait velocity 8.37 sec =3.92 ft/sec ; fast walking speed 6.9 esc = 4.75 ft/sec    Stairs Yes    Stairs Assistance 6: Modified independent (Device/Increase time)    Stair Management Technique No rails;Alternating pattern;Forwards    Number of Stairs 8    Height of Stairs 6    Gait Comments Short distance gait with changes in speed, normal>fast, then jogging 30 ft, no LOB with supervision      Self-Care   Self-Care Other Self-Care Comments    Other Self-Care Comments  Discussed POC and progress towards goals, especially since beginning of therapy.  Discussed importance of continuing to perform HEP consistently, transition to community  fitness.  Also discussed considerations that would warrant return to PT in 3-6 months if needed.           Therapeutic Exercise REviewed full HEP, with pt performing exercises or verbalizing correct performance of exercises.    Exercises Supine Lower Trunk Rotation - 1 x daily - 7 x weekly - 1 sets - 3 reps - 30 sec hold Sit to stand in stride stance - 1 x daily - 7 x weekly - 1-2 sets - 10 reps Side Stepping with Resistance at Thighs and Counter Support - 1 x daily - 5 x weekly - 1 sets - 3 reps (performed today and provided with next level of green band) Tandem Walking with Counter Support - 1 x daily - 5 x weekly - 1 sets - 3 reps Standing Single Leg Stance with Counter Support - 1 x daily - 5 x weekly - 1 sets - 3 reps - 20 hold Step Up - 1 x daily - 5 x weekly - 2-3 sets - 10 reps Wall Quarter Squat - 1 x daily - 5 x weekly - 2-3 sets - 10 reps Heel Toe Raises with Counter Support - 1 x daily - 7 x weekly - 2-3 sets - 10 reps Standing Tandem Balance with Counter Support - 1 x daily - 7 x weekly - 1 sets - 3 reps - 15-30 hold Carioca with Counter Support - 1 x daily - 7 x weekly - 1 sets - 3-5 reps Sidelying Hip Abduction - 1 x daily - 5 x weekly - 2 sets - 10 reps  Neuro Re-education: Reviewed updates to HEP given at least visit.  Pt performs/return demo understanding.   Corner Balance Exercise, with chair in front for support:   Standing on pillows or towel with feet apart, feet together, or partial heel to toe stance:               -Head turns x 10             -Head nods x 10             -You can do this with eyes open and with eyes closed               -Marching in place x 10             -Heel/toe raises x 10             -Forward step taps x 10             -Side step taps x 10     PT Education - 02/05/20  [place]    Education Details POC, progress to goals, plans for d/c from PT    Person(s) Educated Patient    Methods Explanation    Comprehension Verbalized  understanding               PT Long Term Goals - 02/05/20 1439      PT LONG TERM GOAL #1   Title Pt will verbalize plans for continued community fitness upon d/c from PT, to maximize gains made in PT.  TARGET 02/05/2020    Baseline Pt plans to transition to [facility] for use of machines    Time 4    Period Weeks    Status Achieved      PT LONG TERM GOAL #2   Title Pt will perform 6 MWT, at least 1250 ft, with no more than 2 point increase in pain for improved posture and efficiency with gait.    Baseline 1200 ft, 4-5/10 pain.; 01/04/2020:  1115 ft outdoors, 2>3/10 pain with gait; outdoors 1231 ft 01/28/2020    Time 4    Period Weeks    Status Not Met      PT LONG TERM GOAL #3   Title Pt will improve gait velocity to 4 ft/sec for improved gait speed to help care for grandchild.    Baseline 3.79 ft/sec, 3.92>4.75 ft/sec 02/04/2020    Time 4    Period Weeks    Status Achieved      PT LONG TERM GOAL #4   Title FOTO score to improve to at least 60 % for improved overall reported functional mobility.    Baseline 40 at eval, 53% 01/06/2020    Time 4    Period Weeks    Status Deferred   staff forgot to capture at d/c                 Patient will benefit from skilled therapeutic intervention in order to improve the following deficits and impairments:     Visit Diagnosis: Other abnormalities of gait and mobility  Unsteadiness on feet  Muscle weakness (generalized)     Problem List Patient Active Problem List   Diagnosis Date Noted   Lymph node enlargement    Cellulitis of groin, right    Cellulitis    Sepsis ([place]) 11/20/2019   Acute inguinal lymphadenitis    Vascular headache    Other chronic pain    Uncontrolled type 2 diabetes mellitus with hyperglycemia (HCC)    Labile blood glucose    Panic disorder with agoraphobia and moderate panic attacks    Major depressive disorder, recurrent episode, moderate (HCC)    MCI (mild cognitive impairment)  with memory loss    Acute ischemic right middle cerebral artery (MCA) stroke ([place]) 03/09/2019   Left hemiparesis (HCC)    Bradycardia    Tachypnea    SIRS (systemic inflammatory response syndrome) (HCC)    Hypokalemia    Diabetes mellitus type 2 in obese ([place])    Tobacco abuse    History of breast cancer    Cognitive deficits    Dysphagia, post-stroke    Stroke (cerebrum) ([place]) 03/02/2019   Middle cerebral artery embolism, right 03/02/2019   Chest pain 12/19/2015   Numbness 12/19/2015   Femoral hernia 03/24/2014   Dyslipidemia 09/10/2011   BRCA1 positive 09/10/2011   Breast cancer ([place]) 09/10/2011   H/O gastric bypass; 05/14/11([facility]) 09/10/2011   Essential hypertension 10/28/2008   DEPRESSION/ANXIETY 02/19/2007   CARPAL TUNNEL SYNDROME, RIGHT 02/19/2007  ALLERGIC RHINITIS 02/19/2007   GERD 02/19/2007   IRRITABLE BOWEL SYNDROME 02/19/2007   HERPES GENITALIS 12/30/2006   Diabetes ([place]) 12/30/2006   OSTEOPENIA 12/30/2006   ANOMALY, CONGENITAL, NERVOUS SYSTEM NEC 12/30/2006    Vincenzo Stave W. 02/05/2020, 2:45 PM  Frazier Butt., PT   [facility] [address] [place], [place], [address] Phone: [phone]   Fax:  [phone]  Name: Ann Lopez MRN: [id number] Date of Birth: 1970/12/01   PHYSICAL THERAPY DISCHARGE SUMMARY  Visits from Start of Care: 39  Current functional level related to goals / functional outcomes:  PT Long Term Goals - 02/05/20 1439      PT LONG TERM GOAL #1   Title Pt will verbalize plans for continued community fitness upon d/c from PT, to maximize gains made in PT.  TARGET 02/05/2020    Baseline Pt plans to transition to [facility] for use of machines    Time 4    Period Weeks    Status Achieved      PT LONG TERM GOAL #2   Title Pt will perform 6 MWT, at least 1250 ft, with no more than 2 point increase in pain for improved posture and efficiency with  gait.    Baseline 1200 ft, 4-5/10 pain.; 01/04/2020:  1115 ft outdoors, 2>3/10 pain with gait; outdoors 1231 ft 01/28/2020    Time 4    Period Weeks    Status Not Met      PT LONG TERM GOAL #3   Title Pt will improve gait velocity to 4 ft/sec for improved gait speed to help care for grandchild.    Baseline 3.79 ft/sec, 3.92>4.75 ft/sec 02/04/2020    Time 4    Period Weeks    Status Achieved      PT LONG TERM GOAL #4   Title FOTO score to improve to at least 60 % for improved overall reported functional mobility.    Baseline 40 at eval, 53% 01/06/2020    Time 4    Period Weeks    Status Deferred   staff forgot to capture at d/c         Pt has met 2 of 4 LTGs.  Objective measures of note:  Gait velocity 3.92>4.75 ft/sec (improved from 2.82 ft/sec), 1231 ft (no breaks, no increase in pain) in 6MWT.  Pt overall demonstrates improved posture, decreased trunk lean with gait.   Remaining deficits: Decreased LLE strength, decreased high level balance   Education / Equipment: Educated in [employer] and progression, community fitness  Plan: Patient agrees to discharge.  Patient goals were partially met. Patient is being discharged due to being pleased with the current functional level.  ?????    Mady Haagensen, PT 02/05/20 2:49 PM Phone: [phone] Fax: [phone]

## 2020-02-09 ENCOUNTER — Ambulatory Visit: Payer: Medicare Other | Attending: Physical Medicine and Rehabilitation | Admitting: Occupational Therapy

## 2020-02-09 ENCOUNTER — Encounter: Payer: Self-pay | Admitting: Occupational Therapy

## 2020-02-09 ENCOUNTER — Other Ambulatory Visit: Payer: Self-pay

## 2020-02-09 DIAGNOSIS — R2689 Other abnormalities of gait and mobility: Secondary | ICD-10-CM | POA: Diagnosis present

## 2020-02-09 DIAGNOSIS — R293 Abnormal posture: Secondary | ICD-10-CM | POA: Diagnosis present

## 2020-02-09 DIAGNOSIS — R278 Other lack of coordination: Secondary | ICD-10-CM | POA: Diagnosis present

## 2020-02-09 DIAGNOSIS — R29818 Other symptoms and signs involving the nervous system: Secondary | ICD-10-CM | POA: Diagnosis present

## 2020-02-09 DIAGNOSIS — R29898 Other symptoms and signs involving the musculoskeletal system: Secondary | ICD-10-CM

## 2020-02-09 DIAGNOSIS — R2681 Unsteadiness on feet: Secondary | ICD-10-CM | POA: Diagnosis present

## 2020-02-09 DIAGNOSIS — I69354 Hemiplegia and hemiparesis following cerebral infarction affecting left non-dominant side: Secondary | ICD-10-CM | POA: Diagnosis not present

## 2020-02-09 DIAGNOSIS — M6281 Muscle weakness (generalized): Secondary | ICD-10-CM | POA: Insufficient documentation

## 2020-02-09 DIAGNOSIS — M25512 Pain in left shoulder: Secondary | ICD-10-CM | POA: Diagnosis present

## 2020-02-09 NOTE — Patient Instructions (Addendum)
    Shoulder Retraction / External Rotation With Band  EXTENSION: Standing - Resistance Band: Stable (Active)    Stand, arm at side. Against yellow resistance band, draw arm backward, as far as possible, keeping elbow straight. Complete 10 repetitions. Perform  1 sessions per day.  HEAD AND CHEST UP, PERFORM WITH BOTH ARMS AT THE SAME TIME       With band held in front, keep elbows at side while rotating hands apart and pulling shoulder blades back. Hold 2 seconds. Repeat 10 times. Do _2_ sessions per day.  HEAD UP    Resisted Horizontal Abduction: Bilateral   Sit or stand, tubing in both hands, PALMS DOWN  and arms out in front. Keeping arms straight, pinch shoulder blades together and stretch arms out  HOLD AT BELLY BUTTON LEVEL. Repeat 10times per set.  Do 1-2 sessions per day.

## 2020-02-09 NOTE — Therapy (Signed)
[facility] [address] [place], [place], [address] Phone: [phone]   Fax:  [phone]  Occupational Therapy Treatment  Patient Details  Name: Ann Lopez MRN: [id number] Date of Birth: [birth year] Referring Provider (OT): Dr. Leeroy Cha   Encounter Date: 02/09/2020   OT End of Session - 02/09/20 1239    Visit Number 27    Number of Visits 33    Date for OT Re-Evaluation 02/15/20    Authorization Type [employer] Medicare/ Medicaid, covered 100% (medicare guidelines)    Authorization Time Period cert date 08/12/36-75/6/43    Authorization - Number of Visits 27    Progress Note Due on Visit 30    OT Start Time 1234    OT Stop Time 1315    OT Time Calculation (min) 41 min    Activity Tolerance Patient tolerated treatment well    Behavior During Therapy [facility] for tasks assessed/performed           Past Medical History:  Diagnosis Date  . Allergy   . Anxiety   . Asthma   . Back pain   . Breast cancer ([place])   . Cancer ([place])   . Depression   . Diabetes mellitus   . Headache   . Hyperlipemia   . Osteopenia   . Osteoporosis    osteopenia  . Stroke [facility])     Past Surgical History:  Procedure Laterality Date  . ABDOMINAL HYSTERECTOMY     B oophorectomy for BRCA1 gene  . BREAST SURGERY    . CESAREAN SECTION    . INCISION AND DRAINAGE ABSCESS Left 11/14/2012   Procedure: INCISION AND DRAINAGE ABSCESS LEFT GROIN;  Surgeon: Jamesetta So, MD;  Location: [facility];  Service: General;  Laterality: Left;  . INGUINAL LYMPH NODE BIOPSY Right 11/23/2019   Procedure: INGUINAL LYMPH NODE BIOPSY;  Surgeon: Aviva Signs, MD;  Location: [facility];  Service: General;  Laterality: Right;  . IR ANGIO VERTEBRAL SEL SUBCLAVIAN INNOMINATE UNI R MOD SED  03/02/2019  . IR CT HEAD LTD  03/02/2019  . IR INTRAVSC STENT CERV CAROTID W/O EMB-PROT MOD SED INC ANGIO  03/02/2019  . IR PERCUTANEOUS ART THROMBECTOMY/INFUSION INTRACRANIAL INC  DIAG ANGIO  03/02/2019  . LYMPHADENECTOMY    . MASTECTOMY Bilateral   . RADIOLOGY WITH ANESTHESIA N/A 03/02/2019   Procedure: IR WITH ANESTHESIA;  Surgeon: Luanne Bras, MD;  Location: [facility];  Service: Radiology;  Laterality: N/A;    There were no vitals filed for this visit.   Subjective Assessment - 02/09/20 1235    Subjective  Pt reports some continued mild pain (none currently).  Pt reports that after reaching in bathtub with RUE last week she has chest/rib pain (but improved)--none at rest, but touch or with walking 6/10    Patient is accompanied by: Family member    Pertinent History R MCA  stroke with L shoulder pain.  PMH:  diabetes, anxiety, depression hyperlipidemia, smoking, breast cancer s/p bilateral mastectomy followed by chmo in 2009, R carotid stenosis requiring emergent thrombectomy    Limitations fall risk, pain    Patient Stated Goals improve L shoudler pain and ROM, be able to care for grandchild alone    Currently in Pain? No/denies    Pain Onset 1 to 4 weeks ago             Supine, closed-chain shoulder flex, abduction and ER with BUEs with min cueing/facilitation.   Prone, wt. Bearing on  elbows with head/chest lift for scapular depression.  Scapular exercises with shoulders in extension.  Quadruped, forward/backward wt. Shifts for incr scapular stability/mobility.  Tall kneeling, shoulder flex with ball on mat with min cueing for compensation.  Standing, UE ranger for closed-chain shoulder flex, circumduction, abduction (with step) with min facilitation for improved control.  Arm bike x29mn level 1 for reciprocal movement (adjusted position for decr pain), forward/backwards.        OT Education - 02/09/20 1458    Education Details Yellow theraband HEP--see pt instructions    Person(s) Educated Patient    Methods Explanation;Demonstration;Verbal cues;Handout;Tactile cues    Comprehension Verbalized understanding;Returned demonstration;Verbal  cues required;Need further instruction            OT Short Term Goals - 01/14/20 1529      OT SHORT TERM GOAL #1   Title Pt will be independent with initial HEP.--check STGs 11/13/19 (extended due to scheduling)    Time 4    Period Weeks    Status Achieved      OT SHORT TERM GOAL #2   Title Pt will demo at least 70* L shoulder flexion for functional reaching without pain.    Baseline 40*    Time 4    Period Weeks    Status Achieved   10/29/19:  approx 90*     OT SHORT TERM GOAL #3   Title Pt will verbalize understanding of proper positioning of LUE for decr pain.    Time 4    Period Weeks    Status Achieved      OT SHORT TERM GOAL #4   Title Pt will be able to use LUE in simple light home maintenance tasks as non-dominant assist at least 50% of the time with pain less than 3/10 (wiping table, folding clothes, sweeping).    Time 4    Period Weeks    Status Achieved      OT SHORT TERM GOAL #5   Title Pt will demo improved LUE functional reaching for ADLs as shown by improving score on box and blocks test by at least 6.    Baseline 28 blocks    Time 4    Period Weeks    Status Achieved   36     OT SHORT TERM GOAL #6   Title Pt will verbalize understanding of adaptive strategies/AE to incr independence with cooking tasks.    Time 4    Period Weeks    Status Achieved             OT Long Term Goals - 01/28/20 1251      OT LONG TERM GOAL #1   Title Pt will be independent with updated HEP for LUE--check LTGs 11/24/19    Status On-going      OT LONG TERM GOAL #2   Title Pt will demo at least 90* L shoulder flex without pain for functional reaching.--UPGRADED to 100* 01/28/20    Baseline 40* initally. 80* 12/15/19 seated.  90* supine 12/15/19    Time 8    Period Weeks    Status Revised   01/28/20:  90*     OT LONG TERM GOAL #3   Title Pt will be able to use LUE as nondominant assist for functional tasks at least 75% of the time for light ADLs and IADLs with pain  consistently less than or equal to 2/10.    Baseline less than 50%  01/06/20    Time 8  Period Weeks    Status On-going   01/28/20:  50% per pt report with pain consistently 2/10 (up to 7/10 with attempts at high reaching)     OT LONG TERM GOAL #4   Title Pt will improve L grip strength by at least 8lbs to assist in opening containers/home maintenance tasks.    Baseline 42.7lbs initially, 46.7 12/15/19    Time 8    Period Weeks    Status On-going   01/28/20:  43.6lbs     OT LONG TERM GOAL #5   Title Pt will improve LUE coordination as shown by improving time on 9-hole peg test by at least 8sec.    Baseline 40.81sec initally, 38.78 12/15/19    Time 8    Period Weeks    Status On-going   01/28/20:  38.59sec     OT LONG TERM GOAL #6   Title Pt will improve LUE functional reaching for ADLs as shown by improving score on box and blocks test by at least 10.    Baseline 28 initially, 39 - 12/15/19    Time 8    Period Weeks    Status Achieved                 Plan - 02/09/20 1240    Clinical Impression Statement Pt is progressing with improved tolerance to HEP progression with no significant pain.  Emphasized importance of staying in pain-free range.    OT Occupational Profile and History Detailed Assessment- Review of Records and additional review of physical, cognitive, psychosocial history related to current functional performance    Occupational performance deficits (Please refer to evaluation for details): IADL's;ADL's;Leisure    Body Structure / Function / Physical Skills ADL;Balance;IADL;ROM;Strength;Tone;FMC;Coordination;UE functional use;Decreased knowledge of precautions;GMC;Decreased knowledge of use of DME;Pain    Rehab Potential Good    Clinical Decision Making Several treatment options, min-mod task modification necessary    Comorbidities Affecting Occupational Performance: May have comorbidities impacting occupational performance    Modification or Assistance to Complete  Evaluation  Min-Moderate modification of tasks or assist with assess necessary to complete eval    OT Frequency 3x / week    OT Duration 8 weeks    OT Treatment/Interventions Self-care/ADL training;Moist Heat;DME and/or AE instruction;Therapeutic activities;Aquatic Therapy;Therapeutic exercise;Ultrasound;Passive range of motion;Functional Mobility Training;Neuromuscular education;Cryotherapy;Electrical Stimulation;Manual Therapy;Patient/family education    Plan review theraband HEP; functional reach with focus on shoulder positioning, wt. bearing/scapular stabilization and mobilization, begin checking goals    OT Home Exercise Plan supine bilateral shoulder exercises    Consulted and Agree with Plan of Care Patient           Patient will benefit from skilled therapeutic intervention in order to improve the following deficits and impairments:   Body Structure / Function / Physical Skills: ADL, Balance, IADL, ROM, Strength, Tone, FMC, Coordination, UE functional use, Decreased knowledge of precautions, GMC, Decreased knowledge of use of DME, Pain       Visit Diagnosis: Hemiplegia and hemiparesis following cerebral infarction affecting left non-dominant side (HCC)  Acute pain of left shoulder  Abnormal posture  Other lack of coordination  Other symptoms and signs involving the nervous system  Other symptoms and signs involving the musculoskeletal system  Unsteadiness on feet  Other abnormalities of gait and mobility    Problem List Patient Active Problem List   Diagnosis Date Noted  . Lymph node enlargement   . Cellulitis of groin, right   . Cellulitis   . Sepsis ([place]) 11/20/2019  .  Acute inguinal lymphadenitis   . Vascular headache   . Other chronic pain   . Uncontrolled type 2 diabetes mellitus with hyperglycemia ([place])   . Labile blood glucose   . Panic disorder with agoraphobia and moderate panic attacks   . Major depressive disorder, recurrent episode, moderate  ([place])   . MCI (mild cognitive impairment) with memory loss   . Acute ischemic right middle cerebral artery (MCA) stroke ([place]) 03/09/2019  . Left hemiparesis ([place])   . Bradycardia   . Tachypnea   . SIRS (systemic inflammatory response syndrome) (HCC)   . Hypokalemia   . Diabetes mellitus type 2 in obese ([place])   . Tobacco abuse   . History of breast cancer   . Cognitive deficits   . Dysphagia, post-stroke   . Stroke (cerebrum) ([place]) 03/02/2019  . Middle cerebral artery embolism, right 03/02/2019  . Chest pain 12/19/2015  . Numbness 12/19/2015  . Femoral hernia 03/24/2014  . Dyslipidemia 09/10/2011  . BRCA1 positive 09/10/2011  . Breast cancer ([place]) 09/10/2011  . H/O gastric bypass; 05/14/11([facility]) 09/10/2011  . Essential hypertension 10/28/2008  . DEPRESSION/ANXIETY 02/19/2007  . CARPAL TUNNEL SYNDROME, RIGHT 02/19/2007  . ALLERGIC RHINITIS 02/19/2007  . GERD 02/19/2007  . IRRITABLE BOWEL SYNDROME 02/19/2007  . HERPES GENITALIS 12/30/2006  . Diabetes ([place]) 12/30/2006  . OSTEOPENIA 12/30/2006  . ANOMALY, CONGENITAL, NERVOUS SYSTEM NEC 12/30/2006    [facility] 02/09/2020, 2:59 PM  [facility] [address] [place], [place], [address] Phone: [phone]   Fax:  [phone]  Name: HILDEGARDE DUNAWAY MRN: [id number] Date of Birth: 1970/10/11   Vianne Bulls, OTR/L [facility] [address] [place], [place]  [address] [phone] phone [phone] 02/09/20 2:59 PM

## 2020-02-11 ENCOUNTER — Ambulatory Visit: Payer: Medicare Other | Admitting: Occupational Therapy

## 2020-02-16 ENCOUNTER — Other Ambulatory Visit: Payer: Self-pay

## 2020-02-16 ENCOUNTER — Encounter: Payer: Self-pay | Admitting: Occupational Therapy

## 2020-02-16 ENCOUNTER — Ambulatory Visit: Payer: Medicare Other | Admitting: Occupational Therapy

## 2020-02-16 DIAGNOSIS — R29818 Other symptoms and signs involving the nervous system: Secondary | ICD-10-CM

## 2020-02-16 DIAGNOSIS — R293 Abnormal posture: Secondary | ICD-10-CM

## 2020-02-16 DIAGNOSIS — M6281 Muscle weakness (generalized): Secondary | ICD-10-CM

## 2020-02-16 DIAGNOSIS — I69354 Hemiplegia and hemiparesis following cerebral infarction affecting left non-dominant side: Secondary | ICD-10-CM

## 2020-02-16 DIAGNOSIS — R29898 Other symptoms and signs involving the musculoskeletal system: Secondary | ICD-10-CM

## 2020-02-16 DIAGNOSIS — M25512 Pain in left shoulder: Secondary | ICD-10-CM

## 2020-02-16 DIAGNOSIS — R278 Other lack of coordination: Secondary | ICD-10-CM

## 2020-02-16 NOTE — Therapy (Signed)
[facility] [address] [place], [place], [address] Phone: [phone]   Fax:  [phone]  Occupational Therapy Treatment  Patient Details  Name: Ann Lopez MRN: [id number] Date of Birth: [birth year] Referring Provider (OT): Dr. Leeroy Cha   Encounter Date: 02/16/2020   OT End of Session - 02/16/20 1505    Visit Number 28    Number of Visits 40    Date for OT Re-Evaluation 02/15/20    Authorization Type UHC Medicare/ Medicaid, covered 100% (medicare guidelines)    Authorization Time Period cert date 97/9/89-21/19/41    Authorization - Number of Visits 28    Progress Note Due on Visit 30    OT Start Time 1323    OT Stop Time 1402    OT Time Calculation (min) 39 min    Activity Tolerance Patient tolerated treatment well    Behavior During Therapy [facility] for tasks assessed/performed           Past Medical History:  Diagnosis Date  . Allergy   . Anxiety   . Asthma   . Back pain   . Breast cancer ([place])   . Cancer ([place])   . Depression   . Diabetes mellitus   . Headache   . Hyperlipemia   . Osteopenia   . Osteoporosis    osteopenia  . Stroke [facility])     Past Surgical History:  Procedure Laterality Date  . ABDOMINAL HYSTERECTOMY     B oophorectomy for BRCA1 gene  . BREAST SURGERY    . CESAREAN SECTION    . INCISION AND DRAINAGE ABSCESS Left 11/14/2012   Procedure: INCISION AND DRAINAGE ABSCESS LEFT GROIN;  Surgeon: Jamesetta So, MD;  Location: [facility];  Service: General;  Laterality: Left;  . INGUINAL LYMPH NODE BIOPSY Right 11/23/2019   Procedure: INGUINAL LYMPH NODE BIOPSY;  Surgeon: Aviva Signs, MD;  Location: [facility];  Service: General;  Laterality: Right;  . IR ANGIO VERTEBRAL SEL SUBCLAVIAN INNOMINATE UNI R MOD SED  03/02/2019  . IR CT HEAD LTD  03/02/2019  . IR INTRAVSC STENT CERV CAROTID W/O EMB-PROT MOD SED INC ANGIO  03/02/2019  . IR PERCUTANEOUS ART THROMBECTOMY/INFUSION INTRACRANIAL  INC DIAG ANGIO  03/02/2019  . LYMPHADENECTOMY    . MASTECTOMY Bilateral   . RADIOLOGY WITH ANESTHESIA N/A 03/02/2019   Procedure: IR WITH ANESTHESIA;  Surgeon: Luanne Bras, MD;  Location: [facility];  Service: Radiology;  Laterality: N/A;    There were no vitals filed for this visit.   Subjective Assessment - 02/16/20 1326    Subjective  Patient reports no pian currently.  Reports only a little pain now and then    Pertinent History R MCA  stroke with L shoulder pain.  PMH:  diabetes, anxiety, depression hyperlipidemia, smoking, breast cancer s/p bilateral mastectomy followed by chmo in 2009, R carotid stenosis requiring emergent thrombectomy    Limitations fall risk, pain    Patient Stated Goals improve L shoudler pain and ROM, be able to care for grandchild alone    Currently in Pain? No/denies    Pain Score 0-No pain                        OT Treatments/Exercises (OP) - 02/16/20 0001      ADLs   ADL Comments Reviewed remaining goals and discussed new goals for recertification.        Neurological Re-education Exercises   Other Exercises  1 Rolling to sidelying on Left side to address shoulder range of motion, weight bearing on LEft forearm in side sitting to address strengthening and address body on arm motion to increase range of motion.  Patient with initial discomfort, but quickly resolved with sustained activation.        Manual Therapy   Passive ROM PROM and self ROM to address external rotation of left shoulder.  Patient able to tolerate gentle stretch, and does best with prolonged stretch to increase range in minute degrees.  If stretch too aggressive pstient with pain.                    OT Education - 02/16/20 1505    Education Details Reviewed reamining goals for recert    Person(s) Educated Patient    Methods Explanation    Comprehension Verbalized understanding            OT Short Term Goals - 02/16/20 1327      OT SHORT TERM GOAL #1    Title Pt will be independent with initial HEP.--check STGs 11/13/19 (extended due to scheduling)    Time 4    Period Weeks    Status Achieved      OT SHORT TERM GOAL #2   Title Pt will demo at least 70* L shoulder flexion for functional reaching without pain.    Baseline 40*    Time 4    Period Weeks    Status Achieved   10/29/19:  approx 90*     OT SHORT TERM GOAL #3   Title Pt will verbalize understanding of proper positioning of LUE for decr pain.    Time 4    Period Weeks    Status Achieved      OT SHORT TERM GOAL #4   Title Pt will be able to use LUE in simple light home maintenance tasks as non-dominant assist at least 50% of the time with pain less than 3/10 (wiping table, folding clothes, sweeping).    Time 4    Period Weeks    Status Achieved      OT SHORT TERM GOAL #5   Title Pt will demo improved LUE functional reaching for ADLs as shown by improving score on box and blocks test by at least 6.    Baseline 28 blocks    Time 4    Period Weeks    Status Achieved   36     OT SHORT TERM GOAL #6   Title Pt will verbalize understanding of adaptive strategies/AE to incr independence with cooking tasks.    Time 4    Period Weeks    Status Achieved             OT Long Term Goals - 02/16/20 1515      OT LONG TERM GOAL #1   Title Pt will be independent with updated HEP for LUE--check LTGs 11/24/19    Time 8    Period Weeks    Status Achieved      OT LONG TERM GOAL #2   Title Pt will demo at least 90* L shoulder flex without pain for functional reaching.--UPGRADED to 100* 01/28/20    Baseline 40* initally. 80* 12/15/19 seated.  90* supine 12/15/19    Time 8    Period Weeks    Status Revised      OT LONG TERM GOAL #3   Title Pt will be able to use LUE as nondominant assist for  functional tasks at least 75% of the time for light ADLs and IADLs with pain consistently less than or equal to 2/10.    Baseline less than 50%  01/06/20, patient reports 50% 02/16/20    Time 8     Period Weeks    Status On-going      OT LONG TERM GOAL #4   Title Pt will improve L grip strength by at least 8lbs to assist in opening containers/home maintenance tasks.    Baseline 42.7lbs initially, 46.7 12/15/19    Time 8    Period Weeks    Status On-going      OT LONG TERM GOAL #5   Title Pt will improve LUE coordination as shown by improving time on 9-hole peg test by at least 8sec.    Baseline 40.81sec initally, 38.78 12/15/19, 02/16/20 - 32.00    Time 8    Period Weeks    Status Achieved      OT LONG TERM GOAL #6   Title Pt will improve LUE functional reaching for ADLs as shown by improving score on box and blocks test by at least 10.    Baseline 28 initially, 39 - 12/15/19    Time 8    Period Weeks    Status Achieved      OT LONG TERM GOAL #7   Title Patient will report improved perfomance in donning a bra using BUE method and increased time due 03/29/20    Time 6    Period Weeks    Status New      OT LONG TERM GOAL #8   Title Patient will self report improved performance of left hand effectively on steering wheel when driving    Time 6    Period Weeks    Status New      OT LONG TERM GOAL  #9   TITLE Patient will demonstrate improved sustained shoulder flexion/scaption to allow her to comb, dry, style hair with LUE    Time 6    Period Weeks    Status New                 Plan - 02/16/20 1507    Clinical Impression Statement Patient with recent nerve block which has significantly reduced pain at top of left shoulder.  Patient still with pain left lateral arm, although significantly less.  Patient tolerating range of motion and even light strengthening, and has goals of increased functional use of LUE.    OT Occupational Profile and History Detailed Assessment- Review of Records and additional review of physical, cognitive, psychosocial history related to current functional performance    Occupational performance deficits (Please refer to evaluation for  details): IADL's;ADL's;Leisure    Body Structure / Function / Physical Skills ADL;Balance;IADL;ROM;Strength;Tone;FMC;Coordination;UE functional use;Decreased knowledge of precautions;GMC;Decreased knowledge of use of DME;Pain    Rehab Potential Good    Clinical Decision Making Several treatment options, min-mod task modification necessary    Comorbidities Affecting Occupational Performance: May have comorbidities impacting occupational performance    Modification or Assistance to Complete Evaluation  Min-Moderate modification of tasks or assist with assess necessary to complete eval    OT Frequency 2x / week    OT Duration 6 weeks    OT Treatment/Interventions Self-care/ADL training;Moist Heat;DME and/or AE instruction;Therapeutic activities;Aquatic Therapy;Therapeutic exercise;Ultrasound;Passive range of motion;Functional Mobility Training;Neuromuscular education;Cryotherapy;Electrical Stimulation;Manual Therapy;Patient/family education    Plan review theraband HEP; functional reach with focus on shoulder positioning, wt. bearing/scapular stabilization and mobilization, begin checking goals  OT Home Exercise Plan supine bilateral shoulder exercises    Consulted and Agree with Plan of Care Patient           Patient will benefit from skilled therapeutic intervention in order to improve the following deficits and impairments:   Body Structure / Function / Physical Skills: ADL, Balance, IADL, ROM, Strength, Tone, FMC, Coordination, UE functional use, Decreased knowledge of precautions, GMC, Decreased knowledge of use of DME, Pain       Visit Diagnosis: Hemiplegia and hemiparesis following cerebral infarction affecting left non-dominant side (HCC)  Acute pain of left shoulder  Abnormal posture  Other lack of coordination  Other symptoms and signs involving the nervous system  Other symptoms and signs involving the musculoskeletal system  Muscle weakness (generalized)    Problem  List Patient Active Problem List   Diagnosis Date Noted  . Lymph node enlargement   . Cellulitis of groin, right   . Cellulitis   . Sepsis ([place]) 11/20/2019  . Acute inguinal lymphadenitis   . Vascular headache   . Other chronic pain   . Uncontrolled type 2 diabetes mellitus with hyperglycemia ([place])   . Labile blood glucose   . Panic disorder with agoraphobia and moderate panic attacks   . Major depressive disorder, recurrent episode, moderate ([place])   . MCI (mild cognitive impairment) with memory loss   . Acute ischemic right middle cerebral artery (MCA) stroke ([place]) 03/09/2019  . Left hemiparesis ([place])   . Bradycardia   . Tachypnea   . SIRS (systemic inflammatory response syndrome) (HCC)   . Hypokalemia   . Diabetes mellitus type 2 in obese ([place])   . Tobacco abuse   . History of breast cancer   . Cognitive deficits   . Dysphagia, post-stroke   . Stroke (cerebrum) ([place]) 03/02/2019  . Middle cerebral artery embolism, right 03/02/2019  . Chest pain 12/19/2015  . Numbness 12/19/2015  . Femoral hernia 03/24/2014  . Dyslipidemia 09/10/2011  . BRCA1 positive 09/10/2011  . Breast cancer ([place]) 09/10/2011  . H/O gastric bypass; 05/14/11([facility]) 09/10/2011  . Essential hypertension 10/28/2008  . DEPRESSION/ANXIETY 02/19/2007  . CARPAL TUNNEL SYNDROME, RIGHT 02/19/2007  . ALLERGIC RHINITIS 02/19/2007  . GERD 02/19/2007  . IRRITABLE BOWEL SYNDROME 02/19/2007  . HERPES GENITALIS 12/30/2006  . Diabetes ([place]) 12/30/2006  . OSTEOPENIA 12/30/2006  . ANOMALY, CONGENITAL, NERVOUS SYSTEM NEC 25/36/6440   Recert 34/7/42-59/56/38 Ann Lopez, OTR/L 02/16/2020, 3:16 PM  [facility] [address] [place], [place], [address] Phone: [phone]   Fax:  [phone]  Name: Ann Lopez MRN: [id number] Date of Birth: 1971/01/31

## 2020-02-18 ENCOUNTER — Other Ambulatory Visit: Payer: Self-pay

## 2020-02-18 ENCOUNTER — Ambulatory Visit: Payer: Medicare Other | Admitting: Occupational Therapy

## 2020-02-18 ENCOUNTER — Encounter: Payer: Self-pay | Admitting: Physical Medicine and Rehabilitation

## 2020-02-18 ENCOUNTER — Encounter
Payer: Medicare Other | Attending: Physical Medicine and Rehabilitation | Admitting: Physical Medicine and Rehabilitation

## 2020-02-18 ENCOUNTER — Encounter: Payer: Self-pay | Admitting: Occupational Therapy

## 2020-02-18 VITALS — BP 101/70 | HR 80 | Temp 98.1°F | Ht 62.75 in | Wt 166.0 lb

## 2020-02-18 DIAGNOSIS — R278 Other lack of coordination: Secondary | ICD-10-CM

## 2020-02-18 DIAGNOSIS — R29898 Other symptoms and signs involving the musculoskeletal system: Secondary | ICD-10-CM

## 2020-02-18 DIAGNOSIS — R2681 Unsteadiness on feet: Secondary | ICD-10-CM

## 2020-02-18 DIAGNOSIS — M7502 Adhesive capsulitis of left shoulder: Secondary | ICD-10-CM | POA: Diagnosis present

## 2020-02-18 DIAGNOSIS — M6281 Muscle weakness (generalized): Secondary | ICD-10-CM

## 2020-02-18 DIAGNOSIS — R29818 Other symptoms and signs involving the nervous system: Secondary | ICD-10-CM

## 2020-02-18 DIAGNOSIS — I69354 Hemiplegia and hemiparesis following cerebral infarction affecting left non-dominant side: Secondary | ICD-10-CM

## 2020-02-18 DIAGNOSIS — M25512 Pain in left shoulder: Secondary | ICD-10-CM

## 2020-02-18 DIAGNOSIS — I63511 Cerebral infarction due to unspecified occlusion or stenosis of right middle cerebral artery: Secondary | ICD-10-CM | POA: Diagnosis present

## 2020-02-18 DIAGNOSIS — R293 Abnormal posture: Secondary | ICD-10-CM

## 2020-02-18 NOTE — Therapy (Signed)
[facility] [address] [place], [place], [address] Phone: [phone]   Fax:  [phone]  Occupational Therapy Treatment  Patient Details  Name: Ann Lopez MRN: [id number] Date of Birth: Aug 22, 1970 Referring Provider (OT): Dr. Leeroy Cha   Encounter Date: 02/18/2020   OT End of Session - 02/18/20 1700    Visit Number 29    Number of Visits 40    Date for OT Re-Evaluation 03/29/20    Authorization Type UHC Medicare/ Medicaid, covered 100% (medicare guidelines)    Authorization Time Period cert date 76/7/20-94/70/96    Authorization - Number of Visits 29    Progress Note Due on Visit 30    OT Start Time 1615    OT Stop Time 1700    OT Time Calculation (min) 45 min    Activity Tolerance Patient tolerated treatment well    Behavior During Therapy [facility] for tasks assessed/performed           Past Medical History:  Diagnosis Date  . Allergy   . Anxiety   . Asthma   . Back pain   . Breast cancer ([place])   . Cancer ([place])   . Depression   . Diabetes mellitus   . Headache   . Hyperlipemia   . Osteopenia   . Osteoporosis    osteopenia  . Stroke [facility])     Past Surgical History:  Procedure Laterality Date  . ABDOMINAL HYSTERECTOMY     B oophorectomy for BRCA1 gene  . BREAST SURGERY    . CESAREAN SECTION    . INCISION AND DRAINAGE ABSCESS Left 11/14/2012   Procedure: INCISION AND DRAINAGE ABSCESS LEFT GROIN;  Surgeon: Jamesetta So, MD;  Location: [facility];  Service: General;  Laterality: Left;  . INGUINAL LYMPH NODE BIOPSY Right 11/23/2019   Procedure: INGUINAL LYMPH NODE BIOPSY;  Surgeon: Aviva Signs, MD;  Location: [facility];  Service: General;  Laterality: Right;  . IR ANGIO VERTEBRAL SEL SUBCLAVIAN INNOMINATE UNI R MOD SED  03/02/2019  . IR CT HEAD LTD  03/02/2019  . IR INTRAVSC STENT CERV CAROTID W/O EMB-PROT MOD SED INC ANGIO  03/02/2019  . IR PERCUTANEOUS ART THROMBECTOMY/INFUSION INTRACRANIAL  INC DIAG ANGIO  03/02/2019  . LYMPHADENECTOMY    . MASTECTOMY Bilateral   . RADIOLOGY WITH ANESTHESIA N/A 03/02/2019   Procedure: IR WITH ANESTHESIA;  Surgeon: Luanne Bras, MD;  Location: [facility];  Service: Radiology;  Laterality: N/A;    There were no vitals filed for this visit.   Subjective Assessment - 02/18/20 1628    Subjective  Patient received Sprint PNS - axillary nerve placement    Pertinent History R MCA  stroke with L shoulder pain.  PMH:  diabetes, anxiety, depression hyperlipidemia, smoking, breast cancer s/p bilateral mastectomy followed by chmo in 2009, R carotid stenosis requiring emergent thrombectomy    Limitations fall risk, pain    Patient Stated Goals improve L shoudler pain and ROM, be able to care for grandchild alone    Currently in Pain? No/denies    Pain Score 0-No pain              [facility] OT Assessment - 02/18/20 0001      AROM   AROM Assessment Site Shoulder    Right/Left Shoulder Left    Left Shoulder Flexion 150 Degrees   supine, 128 seated   Left Shoulder ABduction 109 Degrees   seated   Left Shoulder External Rotation 50 Degrees  PROM   PROM Assessment Site Shoulder    Right/Left Shoulder Left                    OT Treatments/Exercises (OP) - 02/18/20 0001      Neurological Re-education Exercises   Other Exercises 1 Supine to address shoulder range of motion passive to active with emphasis on external/internal rotation.  Patient with new implanted nerve stimulator - as of today.  Patient able to tolerate greater ranges of rotation with less pain this session.  Followed with sitting exercises to address shoulder flexion/abduction and GH rotation.  Followed with 4 point with emphasis on ext rotation of humerus, activation of scap stabilizers, and shoulder flexion stretch in child's pose                    OT Short Term Goals - 02/18/20 1702      OT SHORT TERM GOAL #1   Title Pt will be independent with initial  HEP.--check STGs 11/13/19 (extended due to scheduling)    Time 4    Period Weeks    Status Achieved      OT SHORT TERM GOAL #2   Title Pt will demo at least 70* L shoulder flexion for functional reaching without pain.    Baseline 40*    Time 4    Period Weeks    Status Achieved   10/29/19:  approx 90*     OT SHORT TERM GOAL #3   Title Pt will verbalize understanding of proper positioning of LUE for decr pain.    Time 4    Period Weeks    Status Achieved      OT SHORT TERM GOAL #4   Title Pt will be able to use LUE in simple light home maintenance tasks as non-dominant assist at least 50% of the time with pain less than 3/10 (wiping table, folding clothes, sweeping).    Time 4    Period Weeks    Status Achieved      OT SHORT TERM GOAL #5   Title Pt will demo improved LUE functional reaching for ADLs as shown by improving score on box and blocks test by at least 6.    Baseline 28 blocks    Time 4    Period Weeks    Status Achieved   36     OT SHORT TERM GOAL #6   Title Pt will verbalize understanding of adaptive strategies/AE to incr independence with cooking tasks.    Time 4    Period Weeks    Status Achieved             OT Long Term Goals - 02/18/20 1702      OT LONG TERM GOAL #1   Title Pt will be independent with updated HEP for LUE--check LTGs 11/24/19    Time 8    Period Weeks    Status Achieved      OT LONG TERM GOAL #2   Title Pt will demo at least 90* L shoulder flex without pain for functional reaching.--UPGRADED to 100* 01/28/20    Baseline 40* initally. 80* 12/15/19 seated.  90* supine 12/15/19    Time 8    Period Weeks    Status Revised      OT LONG TERM GOAL #3   Title Pt will be able to use LUE as nondominant assist for functional tasks at least 75% of the time for light ADLs and IADLs with  pain consistently less than or equal to 2/10.    Baseline less than 50%  01/06/20, patient reports 50% 02/16/20    Time 8    Period Weeks    Status On-going       OT LONG TERM GOAL #4   Title Pt will improve L grip strength by at least 8lbs to assist in opening containers/home maintenance tasks.    Baseline 42.7lbs initially, 46.7 12/15/19    Time 8    Period Weeks    Status On-going      OT LONG TERM GOAL #5   Title Pt will improve LUE coordination as shown by improving time on 9-hole peg test by at least 8sec.    Baseline 40.81sec initally, 38.78 12/15/19, 02/16/20 - 32.00    Time 8    Period Weeks    Status Achieved      OT LONG TERM GOAL #6   Title Pt will improve LUE functional reaching for ADLs as shown by improving score on box and blocks test by at least 10.    Baseline 28 initially, 39 - 12/15/19    Time 8    Period Weeks    Status Achieved      OT LONG TERM GOAL #7   Title Patient will report improved perfomance in donning a bra using BUE method and increased time due 03/29/20    Time 6    Period Weeks    Status New      OT LONG TERM GOAL #8   Title Patient will self report improved performance of left hand effectively on steering wheel when driving    Time 6    Period Weeks    Status New      OT LONG TERM GOAL  #9   TITLE Patient will demonstrate improved sustained shoulder flexion/scaption to allow her to comb, dry, style hair with LUE    Time 6    Period Weeks    Status New                 Plan - 02/18/20 1701    Clinical Impression Statement Patient now with implantable nerve stimulator which appears to be reducing pain with external rotation    OT Occupational Profile and History Detailed Assessment- Review of Records and additional review of physical, cognitive, psychosocial history related to current functional performance    Occupational performance deficits (Please refer to evaluation for details): IADL's;ADL's;Leisure    Body Structure / Function / Physical Skills ADL;Balance;IADL;ROM;Strength;Tone;FMC;Coordination;UE functional use;Decreased knowledge of precautions;GMC;Decreased knowledge of use of DME;Pain     Rehab Potential Good    Clinical Decision Making Several treatment options, min-mod task modification necessary    Comorbidities Affecting Occupational Performance: May have comorbidities impacting occupational performance    Modification or Assistance to Complete Evaluation  Min-Moderate modification of tasks or assist with assess necessary to complete eval    OT Frequency 2x / week    OT Duration 6 weeks    OT Treatment/Interventions Self-care/ADL training;Moist Heat;DME and/or AE instruction;Therapeutic activities;Aquatic Therapy;Therapeutic exercise;Ultrasound;Passive range of motion;Functional Mobility Training;Neuromuscular education;Cryotherapy;Electrical Stimulation;Manual Therapy;Patient/family education    Plan review theraband HEP; functional reach with focus on shoulder positioning, wt. bearing/scapular stabilization and mobilization, begin checking goals    OT Home Exercise Plan supine bilateral shoulder exercises    Consulted and Agree with Plan of Care Patient           Patient will benefit from skilled therapeutic intervention in order to improve the  following deficits and impairments:   Body Structure / Function / Physical Skills: ADL, Balance, IADL, ROM, Strength, Tone, FMC, Coordination, UE functional use, Decreased knowledge of precautions, GMC, Decreased knowledge of use of DME, Pain       Visit Diagnosis: Hemiplegia and hemiparesis following cerebral infarction affecting left non-dominant side (HCC)  Acute pain of left shoulder  Abnormal posture  Other lack of coordination  Other symptoms and signs involving the nervous system  Other symptoms and signs involving the musculoskeletal system  Muscle weakness (generalized)  Unsteadiness on feet    Problem List Patient Active Problem List   Diagnosis Date Noted  . Lymph node enlargement   . Cellulitis of groin, right   . Cellulitis   . Sepsis ([place]) 11/20/2019  . Acute inguinal lymphadenitis   .  Vascular headache   . Other chronic pain   . Uncontrolled type 2 diabetes mellitus with hyperglycemia ([place])   . Labile blood glucose   . Panic disorder with agoraphobia and moderate panic attacks   . Major depressive disorder, recurrent episode, moderate ([place])   . MCI (mild cognitive impairment) with memory loss   . Acute ischemic right middle cerebral artery (MCA) stroke ([place]) 03/09/2019  . Left hemiparesis ([place])   . Bradycardia   . Tachypnea   . SIRS (systemic inflammatory response syndrome) (HCC)   . Hypokalemia   . Diabetes mellitus type 2 in obese ([place])   . Tobacco abuse   . History of breast cancer   . Cognitive deficits   . Dysphagia, post-stroke   . Stroke (cerebrum) ([place]) 03/02/2019  . Middle cerebral artery embolism, right 03/02/2019  . Chest pain 12/19/2015  . Numbness 12/19/2015  . Femoral hernia 03/24/2014  . Dyslipidemia 09/10/2011  . BRCA1 positive 09/10/2011  . Breast cancer ([place]) 09/10/2011  . H/O gastric bypass; 05/14/11([facility]) 09/10/2011  . Essential hypertension 10/28/2008  . DEPRESSION/ANXIETY 02/19/2007  . CARPAL TUNNEL SYNDROME, RIGHT 02/19/2007  . ALLERGIC RHINITIS 02/19/2007  . GERD 02/19/2007  . IRRITABLE BOWEL SYNDROME 02/19/2007  . HERPES GENITALIS 12/30/2006  . Diabetes ([place]) 12/30/2006  . OSTEOPENIA 12/30/2006  . ANOMALY, CONGENITAL, NERVOUS SYSTEM NEC 12/30/2006    Mariah Milling, OTR/L 02/18/2020, 5:03 PM  [facility] [address] [place], [place], [address] Phone: [phone]   Fax:  [phone]  Name: Ann Lopez MRN: [id number] Date of Birth: 24-Oct-1970

## 2020-02-18 NOTE — Progress Notes (Signed)
Procedure: SPRINT PNS placement. Location: Left Axillary nerve Indication: Left Shoulder pain in axillary nerve distribution  Risks and benefits of procedure were discussed with the patient and consent was obtained.  The site was anesthetized with 1 cc lidocaine. Needle injected lateral to medial and stimulation resulted in deltoid movement and paresthesias in the appropriate distribution. Pulse generator was placed on the lateral arm.  Patient tolerated the procedure well and post-procedure instructions were given.

## 2020-02-23 ENCOUNTER — Ambulatory Visit: Payer: Medicare Other | Admitting: Occupational Therapy

## 2020-03-01 ENCOUNTER — Ambulatory Visit: Payer: Medicare Other | Admitting: Occupational Therapy

## 2020-03-02 ENCOUNTER — Encounter: Payer: Self-pay | Admitting: Occupational Therapy

## 2020-03-02 ENCOUNTER — Ambulatory Visit: Payer: Medicare Other | Admitting: Occupational Therapy

## 2020-03-02 ENCOUNTER — Other Ambulatory Visit: Payer: Self-pay

## 2020-03-02 DIAGNOSIS — R29898 Other symptoms and signs involving the musculoskeletal system: Secondary | ICD-10-CM

## 2020-03-02 DIAGNOSIS — M6281 Muscle weakness (generalized): Secondary | ICD-10-CM

## 2020-03-02 DIAGNOSIS — R2681 Unsteadiness on feet: Secondary | ICD-10-CM

## 2020-03-02 DIAGNOSIS — I69354 Hemiplegia and hemiparesis following cerebral infarction affecting left non-dominant side: Secondary | ICD-10-CM | POA: Diagnosis not present

## 2020-03-02 DIAGNOSIS — M25512 Pain in left shoulder: Secondary | ICD-10-CM

## 2020-03-02 DIAGNOSIS — R293 Abnormal posture: Secondary | ICD-10-CM

## 2020-03-02 DIAGNOSIS — R29818 Other symptoms and signs involving the nervous system: Secondary | ICD-10-CM

## 2020-03-02 DIAGNOSIS — R278 Other lack of coordination: Secondary | ICD-10-CM

## 2020-03-02 NOTE — Therapy (Signed)
[facility] [address] [place], [place], [address] Phone: [phone]   Fax:  [phone]  Occupational Therapy Treatment and Progress Note  Patient Details  Name: Ann Lopez MRN: [id number] Date of Birth: [birth year] Referring Provider (OT): Dr. Leeroy Cha  This progress note covers dates of service from 9/29-11/24/21.    Encounter Date: 03/02/2020   OT End of Session - 03/02/20 1753    Visit Number 30    Number of Visits 40    Date for OT Re-Evaluation 03/29/20    Authorization Type UHC Medicare/ Medicaid, covered 100% (medicare guidelines)    Authorization Time Period cert date 27/5/17-00/17/49    Authorization - Number of Visits 30    Progress Note Due on Visit 24    OT Start Time 1700    OT Stop Time 1743    OT Time Calculation (min) 43 min    Equipment Utilized During Treatment hot pack    Activity Tolerance Patient tolerated treatment well    Behavior During Therapy WFL for tasks assessed/performed           Past Medical History:  Diagnosis Date  . Allergy   . Anxiety   . Asthma   . Back pain   . Breast cancer ([place])   . Cancer ([place])   . Depression   . Diabetes mellitus   . Headache   . Hyperlipemia   . Osteopenia   . Osteoporosis    osteopenia  . Stroke [facility])     Past Surgical History:  Procedure Laterality Date  . ABDOMINAL HYSTERECTOMY     B oophorectomy for BRCA1 gene  . BREAST SURGERY    . CESAREAN SECTION    . INCISION AND DRAINAGE ABSCESS Left 11/14/2012   Procedure: INCISION AND DRAINAGE ABSCESS LEFT GROIN;  Surgeon: Jamesetta So, MD;  Location: [facility];  Service: General;  Laterality: Left;  . INGUINAL LYMPH NODE BIOPSY Right 11/23/2019   Procedure: INGUINAL LYMPH NODE BIOPSY;  Surgeon: Aviva Signs, MD;  Location: [facility];  Service: General;  Laterality: Right;  . IR ANGIO VERTEBRAL SEL SUBCLAVIAN INNOMINATE UNI R MOD SED  03/02/2019  . IR CT HEAD LTD  03/02/2019  .  IR INTRAVSC STENT CERV CAROTID W/O EMB-PROT MOD SED INC ANGIO  03/02/2019  . IR PERCUTANEOUS ART THROMBECTOMY/INFUSION INTRACRANIAL INC DIAG ANGIO  03/02/2019  . LYMPHADENECTOMY    . MASTECTOMY Bilateral   . RADIOLOGY WITH ANESTHESIA N/A 03/02/2019   Procedure: IR WITH ANESTHESIA;  Surgeon: Luanne Bras, MD;  Location: [place] [facility] [place];  Service: Radiology;  Laterality: N/A;    There were no vitals filed for this visit.   Subjective Assessment - 03/02/20 1704    Subjective  I cannot tell a difference really    Patient is accompanied by: Family member    Pertinent History R MCA  stroke with L shoulder pain.  PMH:  diabetes, anxiety, depression hyperlipidemia, smoking, breast cancer s/p bilateral mastectomy followed by chmo in 2009, R carotid stenosis requiring emergent thrombectomy    Limitations fall risk, pain    Patient Stated Goals improve L shoudler pain and ROM, be able to care for grandchild alone    Currently in Pain? No/denies    Pain Score 0-No pain                        OT Treatments/Exercises (OP) - 03/02/20 0001      Neurological Re-education Exercises  Other Exercises 1 Supine to sidelying to address body on arm motion to increase stretch to left shoulder capsule.  Worked on rolling up onto left forearm.  Worked on actively pushing up onto left hand to transition from sidelying to sitting.        Moist Heat Therapy   Number Minutes Moist Heat 8 Minutes    Moist Heat Location Shoulder      Manual Therapy   Manual therapy comments Tight across both shoulders superiorly    Scapular Mobilization In all planes to address scapular and [place] joint mobility                     OT Short Term Goals - 03/02/20 1755      OT SHORT TERM GOAL #1   Title Pt will be independent with initial HEP.--check STGs 11/13/19 (extended due to scheduling)    Time 4    Period Weeks    Status Achieved      OT SHORT TERM GOAL #2   Title Pt will demo at least 70* L shoulder  flexion for functional reaching without pain.    Baseline 40*    Time 4    Period Weeks    Status Achieved   10/29/19:  approx 90*     OT SHORT TERM GOAL #3   Title Pt will verbalize understanding of proper positioning of LUE for decr pain.    Time 4    Period Weeks    Status Achieved      OT SHORT TERM GOAL #4   Title Pt will be able to use LUE in simple light home maintenance tasks as non-dominant assist at least 50% of the time with pain less than 3/10 (wiping table, folding clothes, sweeping).    Time 4    Period Weeks    Status Achieved      OT SHORT TERM GOAL #5   Title Pt will demo improved LUE functional reaching for ADLs as shown by improving score on box and blocks test by at least 6.    Baseline 28 blocks    Time 4    Period Weeks    Status Achieved   36     OT SHORT TERM GOAL #6   Title Pt will verbalize understanding of adaptive strategies/AE to incr independence with cooking tasks.    Time 4    Period Weeks    Status Achieved             OT Long Term Goals - 03/02/20 1755      OT LONG TERM GOAL #1   Title Pt will be independent with updated HEP for LUE--check LTGs 11/24/19    Time 8    Period Weeks    Status Achieved      OT LONG TERM GOAL #2   Title Pt will demo at least 90* L shoulder flex without pain for functional reaching.--UPGRADED to 100* 01/28/20    Baseline 40* initally. 80* 12/15/19 seated.  90* supine 12/15/19    Time 8    Period Weeks    Status On-going      OT LONG TERM GOAL #3   Title Pt will be able to use LUE as nondominant assist for functional tasks at least 75% of the time for light ADLs and IADLs with pain consistently less than or equal to 2/10.    Baseline less than 50%  01/06/20, patient reports 50% 02/16/20    Time 8  Period Weeks    Status On-going      OT LONG TERM GOAL #4   Title Pt will improve L grip strength by at least 8lbs to assist in opening containers/home maintenance tasks.    Baseline 42.7lbs initially, 46.7  12/15/19    Time 8    Period Weeks    Status On-going      OT LONG TERM GOAL #5   Title Pt will improve LUE coordination as shown by improving time on 9-hole peg test by at least 8sec.    Baseline 40.81sec initally, 38.78 12/15/19, 02/16/20 - 32.00    Time 8    Period Weeks    Status Achieved      OT LONG TERM GOAL #6   Title Pt will improve LUE functional reaching for ADLs as shown by improving score on box and blocks test by at least 10.    Baseline 28 initially, 39 - 12/15/19    Time 8    Period Weeks    Status Achieved      OT LONG TERM GOAL #7   Title Patient will report improved perfomance in donning a bra using BUE method and increased time due 03/29/20    Time 6    Period Weeks    Status On-going      OT LONG TERM GOAL #8   Title Patient will self report improved performance of left hand effectively on steering wheel when driving    Time 6    Period Weeks    Status On-going      OT LONG TERM GOAL  #9   TITLE Patient will demonstrate improved sustained shoulder flexion/scaption to allow her to comb, dry, style hair with LUE    Time 6    Period Weeks    Status Achieved                 Plan - 03/02/20 1704    Clinical Impression Statement Patient with slowly improving functional use of left arm for higher reach tasks, e.g. washing hair    OT Occupational Profile and History Detailed Assessment- Review of Records and additional review of physical, cognitive, psychosocial history related to current functional performance    Occupational performance deficits (Please refer to evaluation for details): IADL's;ADL's;Leisure    Body Structure / Function / Physical Skills ADL;Balance;IADL;ROM;Strength;Tone;FMC;Coordination;UE functional use;Decreased knowledge of precautions;GMC;Decreased knowledge of use of DME;Pain    Rehab Potential Good    Clinical Decision Making Several treatment options, min-mod task modification necessary    Comorbidities Affecting Occupational  Performance: May have comorbidities impacting occupational performance    Modification or Assistance to Complete Evaluation  Min-Moderate modification of tasks or assist with assess necessary to complete eval    OT Frequency 2x / week    OT Duration 6 weeks    OT Treatment/Interventions Self-care/ADL training;Moist Heat;DME and/or AE instruction;Therapeutic activities;Aquatic Therapy;Therapeutic exercise;Ultrasound;Passive range of motion;Functional Mobility Training;Neuromuscular education;Cryotherapy;Electrical Stimulation;Manual Therapy;Patient/family education    Plan Continued focus on shoulder versus body for higher reach, and external rotation, scapular depression    OT Home Exercise Plan supine bilateral shoulder exercises           Patient will benefit from skilled therapeutic intervention in order to improve the following deficits and impairments:   Body Structure / Function / Physical Skills: ADL, Balance, IADL, ROM, Strength, Tone, FMC, Coordination, UE functional use, Decreased knowledge of precautions, GMC, Decreased knowledge of use of DME, Pain       Visit  Diagnosis: Hemiplegia and hemiparesis following cerebral infarction affecting left non-dominant side (HCC)  Acute pain of left shoulder  Abnormal posture  Other lack of coordination  Other symptoms and signs involving the nervous system  Other symptoms and signs involving the musculoskeletal system  Muscle weakness (generalized)  Unsteadiness on feet    Problem List Patient Active Problem List   Diagnosis Date Noted  . Lymph node enlargement   . Cellulitis of groin, right   . Cellulitis   . Sepsis ([place]) 11/20/2019  . Acute inguinal lymphadenitis   . Vascular headache   . Other chronic pain   . Uncontrolled type 2 diabetes mellitus with hyperglycemia ([place])   . Labile blood glucose   . Panic disorder with agoraphobia and moderate panic attacks   . Major depressive disorder, recurrent episode, moderate  ([place])   . MCI (mild cognitive impairment) with memory loss   . Acute ischemic right middle cerebral artery (MCA) stroke ([place]) 03/09/2019  . Left hemiparesis ([place])   . Bradycardia   . Tachypnea   . SIRS (systemic inflammatory response syndrome) (HCC)   . Hypokalemia   . Diabetes mellitus type 2 in obese ([place])   . Tobacco abuse   . History of breast cancer   . Cognitive deficits   . Dysphagia, post-stroke   . Stroke (cerebrum) ([place]) 03/02/2019  . Middle cerebral artery embolism, right 03/02/2019  . Chest pain 12/19/2015  . Numbness 12/19/2015  . Femoral hernia 03/24/2014  . Dyslipidemia 09/10/2011  . BRCA1 positive 09/10/2011  . Breast cancer ([place]) 09/10/2011  . H/O gastric bypass; 05/14/11([facility]) 09/10/2011  . Essential hypertension 10/28/2008  . DEPRESSION/ANXIETY 02/19/2007  . CARPAL TUNNEL SYNDROME, RIGHT 02/19/2007  . ALLERGIC RHINITIS 02/19/2007  . GERD 02/19/2007  . IRRITABLE BOWEL SYNDROME 02/19/2007  . HERPES GENITALIS 12/30/2006  . Diabetes ([place]) 12/30/2006  . OSTEOPENIA 12/30/2006  . ANOMALY, CONGENITAL, NERVOUS SYSTEM NEC 12/30/2006    Mariah Milling, OTR/L 03/02/2020, 5:57 PM  [facility] [address] [place], [place], [address] Phone: [phone]   Fax:  [phone]  Name: Ann Lopez MRN: [id number] Date of Birth: 05-17-1970

## 2020-03-08 ENCOUNTER — Other Ambulatory Visit: Payer: Self-pay

## 2020-03-08 ENCOUNTER — Encounter: Payer: Self-pay | Admitting: Occupational Therapy

## 2020-03-08 ENCOUNTER — Ambulatory Visit: Payer: Medicare Other | Admitting: Occupational Therapy

## 2020-03-08 DIAGNOSIS — R278 Other lack of coordination: Secondary | ICD-10-CM

## 2020-03-08 DIAGNOSIS — R29898 Other symptoms and signs involving the musculoskeletal system: Secondary | ICD-10-CM

## 2020-03-08 DIAGNOSIS — R293 Abnormal posture: Secondary | ICD-10-CM

## 2020-03-08 DIAGNOSIS — I69354 Hemiplegia and hemiparesis following cerebral infarction affecting left non-dominant side: Secondary | ICD-10-CM | POA: Diagnosis not present

## 2020-03-08 DIAGNOSIS — M25512 Pain in left shoulder: Secondary | ICD-10-CM

## 2020-03-08 DIAGNOSIS — R29818 Other symptoms and signs involving the nervous system: Secondary | ICD-10-CM

## 2020-03-08 NOTE — Therapy (Signed)
[facility] [address] [place], [place], [address] Phone: [phone]   Fax:  [phone]  Occupational Therapy Treatment  Patient Details  Name: Ann Lopez MRN: [id number] Date of Birth: 05-26-1970 Referring Provider (OT): Dr. Leeroy Cha   Encounter Date: 03/08/2020   OT End of Session - 03/08/20 1025    Visit Number 31    Number of Visits 40    Date for OT Re-Evaluation 03/29/20    Authorization Type UHC Medicare/ Medicaid, covered 100% (medicare guidelines)    Authorization Time Period cert date 99/3/71-69/67/89    Authorization - Number of Visits 31    Progress Note Due on Visit 73    OT Start Time 1021    OT Stop Time 1100    OT Time Calculation (min) 39 min    Equipment Utilized During Treatment --    Activity Tolerance Patient tolerated treatment well    Behavior During Therapy [facility] for tasks assessed/performed           Past Medical History:  Diagnosis Date  . Allergy   . Anxiety   . Asthma   . Back pain   . Breast cancer ([place])   . Cancer ([place])   . Depression   . Diabetes mellitus   . Headache   . Hyperlipemia   . Osteopenia   . Osteoporosis    osteopenia  . Stroke [facility])     Past Surgical History:  Procedure Laterality Date  . ABDOMINAL HYSTERECTOMY     B oophorectomy for BRCA1 gene  . BREAST SURGERY    . CESAREAN SECTION    . INCISION AND DRAINAGE ABSCESS Left 11/14/2012   Procedure: INCISION AND DRAINAGE ABSCESS LEFT GROIN;  Surgeon: Jamesetta So, MD;  Location: [facility];  Service: General;  Laterality: Left;  . INGUINAL LYMPH NODE BIOPSY Right 11/23/2019   Procedure: INGUINAL LYMPH NODE BIOPSY;  Surgeon: Aviva Signs, MD;  Location: [facility];  Service: General;  Laterality: Right;  . IR ANGIO VERTEBRAL SEL SUBCLAVIAN INNOMINATE UNI R MOD SED  03/02/2019  . IR CT HEAD LTD  03/02/2019  . IR INTRAVSC STENT CERV CAROTID W/O EMB-PROT MOD SED INC ANGIO  03/02/2019  . IR  PERCUTANEOUS ART THROMBECTOMY/INFUSION INTRACRANIAL INC DIAG ANGIO  03/02/2019  . LYMPHADENECTOMY    . MASTECTOMY Bilateral   . RADIOLOGY WITH ANESTHESIA N/A 03/02/2019   Procedure: IR WITH ANESTHESIA;  Surgeon: Luanne Bras, MD;  Location: [facility];  Service: Radiology;  Laterality: N/A;    There were no vitals filed for this visit.   Subjective Assessment - 03/08/20 1023    Subjective  I cannot tell a difference really (stimulator).  Pt reports that she can use LUE to help turn bra--"I can do it, but it's hard"    Patient is accompanied by: Family member    Pertinent History R MCA  stroke with L shoulder pain.  PMH:  diabetes, anxiety, depression hyperlipidemia, smoking, breast cancer s/p bilateral mastectomy followed by chmo in 2009, R carotid stenosis requiring emergent thrombectomy    Limitations fall risk, pain    Patient Stated Goals improve L shoudler pain and ROM, be able to care for grandchild alone    Currently in Pain? Yes    Pain Score --   1-2/10   Pain Location Shoulder    Pain Orientation Left    Pain Descriptors / Indicators Aching    Pain Type Acute pain    Pain Onset More  than a month ago    Pain Frequency Intermittent    Aggravating Factors  particular movments    Pain Relieving Factors pain meds, heat             Supine gentle joint mobs to L shoulder and soft tissue mobilization to upper traps and boarder of scapula, PROM in circumduction, ER, and shoulder flex with ER.  Supine, shoulder flex and abduction with cane with min facilitation for positioning and to avoid IR in higher ranges.  Sitting, ER with min cueing with cane.  Wt. Bearing through L hand on mat with body on arm movements for scapular stabilization followed by wt. Bearing through wall in abduction with body on arm movements with min cueing.  Standing, UE ranger for high range shoulder flex and abduction with min cueing for scapular depression.  Standing, functional reaching to  place/remove clothespins with 1-8lb resistance on vertical pole for incr hand strength and ROM.  Pt demo 120* L shoulder flex today--see goal status below.         OT Short Term Goals - 03/02/20 1755      OT SHORT TERM GOAL #1   Title Pt will be independent with initial HEP.--check STGs 11/13/19 (extended due to scheduling)    Time 4    Period Weeks    Status Achieved      OT SHORT TERM GOAL #2   Title Pt will demo at least 70* L shoulder flexion for functional reaching without pain.    Baseline 40*    Time 4    Period Weeks    Status Achieved   10/29/19:  approx 90*     OT SHORT TERM GOAL #3   Title Pt will verbalize understanding of proper positioning of LUE for decr pain.    Time 4    Period Weeks    Status Achieved      OT SHORT TERM GOAL #4   Title Pt will be able to use LUE in simple light home maintenance tasks as non-dominant assist at least 50% of the time with pain less than 3/10 (wiping table, folding clothes, sweeping).    Time 4    Period Weeks    Status Achieved      OT SHORT TERM GOAL #5   Title Pt will demo improved LUE functional reaching for ADLs as shown by improving score on box and blocks test by at least 6.    Baseline 28 blocks    Time 4    Period Weeks    Status Achieved   36     OT SHORT TERM GOAL #6   Title Pt will verbalize understanding of adaptive strategies/AE to incr independence with cooking tasks.    Time 4    Period Weeks    Status Achieved             OT Long Term Goals - 03/08/20 1057      OT LONG TERM GOAL #1   Title Pt will be independent with updated HEP for LUE--check LTGs 11/24/19    Time 8    Period Weeks    Status Achieved      OT LONG TERM GOAL #2   Title Pt will demo at least 90* L shoulder flex without pain for functional reaching.--UPGRADED to 100* 01/28/20    Baseline 40* initally. 80* 12/15/19 seated.  90* supine 12/15/19    Time 8    Period Weeks    Status Achieved  03/08/20:  120*     OT LONG TERM GOAL  #3   Title Pt will be able to use LUE as nondominant assist for functional tasks at least 75% of the time for light ADLs and IADLs with pain consistently less than or equal to 2/10.    Baseline less than 50%  01/06/20, patient reports 50% 02/16/20    Time 8    Period Weeks    Status On-going      OT LONG TERM GOAL #4   Title Pt will improve L grip strength by at least 8lbs to assist in opening containers/home maintenance tasks.    Baseline 42.7lbs initially, 46.7 12/15/19    Time 8    Period Weeks    Status On-going      OT LONG TERM GOAL #5   Title Pt will improve LUE coordination as shown by improving time on 9-hole peg test by at least 8sec.    Baseline 40.81sec initally, 38.78 12/15/19, 02/16/20 - 32.00    Time 8    Period Weeks    Status Achieved      OT LONG TERM GOAL #6   Title Pt will improve LUE functional reaching for ADLs as shown by improving score on box and blocks test by at least 10.    Baseline 28 initially, 39 - 12/15/19    Time 8    Period Weeks    Status Achieved      OT LONG TERM GOAL #7   Title Patient will report improved perfomance in donning a bra using BUE method and increased time due 03/29/20    Time 6    Period Weeks    Status Achieved   03/08/20     OT LONG TERM GOAL #8   Title Patient will self report improved performance of left hand effectively on steering wheel when driving    Time 6    Period Weeks    Status On-going      OT LONG TERM GOAL  #9   TITLE Patient will demonstrate improved sustained shoulder flexion/scaption to allow her to comb, dry, style hair with LUE    Time 6    Period Weeks    Status Achieved                 Plan - 03/08/20 1025    Clinical Impression Statement Pt met LTG#2 and #7 with improved LUE ROM for functional reach.  Pt progressing slowly toward remaining goals.    OT Occupational Profile and History Detailed Assessment- Review of Records and additional review of physical, cognitive, psychosocial history  related to current functional performance    Occupational performance deficits (Please refer to evaluation for details): IADL's;ADL's;Leisure    Body Structure / Function / Physical Skills ADL;Balance;IADL;ROM;Strength;Tone;FMC;Coordination;UE functional use;Decreased knowledge of precautions;GMC;Decreased knowledge of use of DME;Pain    Rehab Potential Good    Clinical Decision Making Several treatment options, min-mod task modification necessary    Comorbidities Affecting Occupational Performance: May have comorbidities impacting occupational performance    Modification or Assistance to Complete Evaluation  Min-Moderate modification of tasks or assist with assess necessary to complete eval    OT Frequency 2x / week    OT Duration 6 weeks    OT Treatment/Interventions Self-care/ADL training;Moist Heat;DME and/or AE instruction;Therapeutic activities;Aquatic Therapy;Therapeutic exercise;Ultrasound;Passive range of motion;Functional Mobility Training;Neuromuscular education;Cryotherapy;Electrical Stimulation;Manual Therapy;Patient/family education    Plan Continued focus on shoulder versus body for higher reach, and external rotation, scapular depression  OT Home Exercise Plan supine bilateral shoulder exercises    Consulted and Agree with Plan of Care Patient           Patient will benefit from skilled therapeutic intervention in order to improve the following deficits and impairments:   Body Structure / Function / Physical Skills: ADL, Balance, IADL, ROM, Strength, Tone, FMC, Coordination, UE functional use, Decreased knowledge of precautions, GMC, Decreased knowledge of use of DME, Pain       Visit Diagnosis: Hemiplegia and hemiparesis following cerebral infarction affecting left non-dominant side (HCC)  Acute pain of left shoulder  Abnormal posture  Other lack of coordination  Other symptoms and signs involving the nervous system  Other symptoms and signs involving the  musculoskeletal system    Problem List Patient Active Problem List   Diagnosis Date Noted  . Lymph node enlargement   . Cellulitis of groin, right   . Cellulitis   . Sepsis ([place]) 11/20/2019  . Acute inguinal lymphadenitis   . Vascular headache   . Other chronic pain   . Uncontrolled type 2 diabetes mellitus with hyperglycemia ([place])   . Labile blood glucose   . Panic disorder with agoraphobia and moderate panic attacks   . Major depressive disorder, recurrent episode, moderate ([place])   . MCI (mild cognitive impairment) with memory loss   . Acute ischemic right middle cerebral artery (MCA) stroke ([place]) 03/09/2019  . Left hemiparesis ([place])   . Bradycardia   . Tachypnea   . SIRS (systemic inflammatory response syndrome) (HCC)   . Hypokalemia   . Diabetes mellitus type 2 in obese ([place])   . Tobacco abuse   . History of breast cancer   . Cognitive deficits   . Dysphagia, post-stroke   . Stroke (cerebrum) ([place]) 03/02/2019  . Middle cerebral artery embolism, right 03/02/2019  . Chest pain 12/19/2015  . Numbness 12/19/2015  . Femoral hernia 03/24/2014  . Dyslipidemia 09/10/2011  . BRCA1 positive 09/10/2011  . Breast cancer ([place]) 09/10/2011  . H/O gastric bypass; 05/14/11([facility]) 09/10/2011  . Essential hypertension 10/28/2008  . DEPRESSION/ANXIETY 02/19/2007  . CARPAL TUNNEL SYNDROME, RIGHT 02/19/2007  . ALLERGIC RHINITIS 02/19/2007  . GERD 02/19/2007  . IRRITABLE BOWEL SYNDROME 02/19/2007  . HERPES GENITALIS 12/30/2006  . Diabetes ([place]) 12/30/2006  . OSTEOPENIA 12/30/2006  . ANOMALY, CONGENITAL, NERVOUS SYSTEM NEC 12/30/2006    [facility] 03/08/2020, 12:25 PM  [place] [address] [place], [place], [address] Phone: [phone]   Fax:  [phone]  Name: Ann Lopez MRN: [id number] Date of Birth: 1970-06-08   Vianne Bulls, OTR/L [facility] [address] [place], [place]  [address] [phone] phone [phone] 03/08/20 12:25 PM

## 2020-03-10 ENCOUNTER — Ambulatory Visit: Payer: Medicare Other | Attending: Physical Medicine and Rehabilitation | Admitting: Occupational Therapy

## 2020-03-10 DIAGNOSIS — M6281 Muscle weakness (generalized): Secondary | ICD-10-CM | POA: Insufficient documentation

## 2020-03-10 DIAGNOSIS — R278 Other lack of coordination: Secondary | ICD-10-CM | POA: Insufficient documentation

## 2020-03-10 DIAGNOSIS — I69354 Hemiplegia and hemiparesis following cerebral infarction affecting left non-dominant side: Secondary | ICD-10-CM | POA: Insufficient documentation

## 2020-03-10 DIAGNOSIS — R2681 Unsteadiness on feet: Secondary | ICD-10-CM | POA: Insufficient documentation

## 2020-03-10 DIAGNOSIS — R29818 Other symptoms and signs involving the nervous system: Secondary | ICD-10-CM | POA: Insufficient documentation

## 2020-03-10 DIAGNOSIS — R293 Abnormal posture: Secondary | ICD-10-CM | POA: Insufficient documentation

## 2020-03-10 DIAGNOSIS — R29898 Other symptoms and signs involving the musculoskeletal system: Secondary | ICD-10-CM | POA: Insufficient documentation

## 2020-03-10 DIAGNOSIS — M25512 Pain in left shoulder: Secondary | ICD-10-CM | POA: Insufficient documentation

## 2020-03-15 ENCOUNTER — Ambulatory Visit: Payer: Medicare Other | Admitting: Occupational Therapy

## 2020-03-15 ENCOUNTER — Other Ambulatory Visit: Payer: Self-pay

## 2020-03-15 ENCOUNTER — Other Ambulatory Visit (HOSPITAL_COMMUNITY): Payer: Self-pay | Admitting: Student

## 2020-03-15 DIAGNOSIS — R278 Other lack of coordination: Secondary | ICD-10-CM

## 2020-03-15 DIAGNOSIS — R29898 Other symptoms and signs involving the musculoskeletal system: Secondary | ICD-10-CM | POA: Diagnosis present

## 2020-03-15 DIAGNOSIS — M6281 Muscle weakness (generalized): Secondary | ICD-10-CM | POA: Diagnosis present

## 2020-03-15 DIAGNOSIS — R293 Abnormal posture: Secondary | ICD-10-CM

## 2020-03-15 DIAGNOSIS — M25512 Pain in left shoulder: Secondary | ICD-10-CM | POA: Diagnosis present

## 2020-03-15 DIAGNOSIS — I69354 Hemiplegia and hemiparesis following cerebral infarction affecting left non-dominant side: Secondary | ICD-10-CM

## 2020-03-15 DIAGNOSIS — R29818 Other symptoms and signs involving the nervous system: Secondary | ICD-10-CM

## 2020-03-15 DIAGNOSIS — R2681 Unsteadiness on feet: Secondary | ICD-10-CM | POA: Diagnosis present

## 2020-03-15 MED ORDER — TICAGRELOR 90 MG PO TABS: 90.0000 mg | ORAL_TABLET | Freq: Two times a day (BID) | ORAL | 2 refills | Status: DC

## 2020-03-15 NOTE — Therapy (Signed)
[facility] [address] [place], [place], [address] Phone: [phone]   Fax:  [phone]  Occupational Therapy Treatment  Patient Details  Name: Ann Lopez MRN: [id number] Date of Birth: September 13, 1970 Referring Provider (OT): Dr. Leeroy Cha   Encounter Date: 03/15/2020   OT End of Session - 03/15/20 1022    Visit Number 32    Number of Visits 40    Date for OT Re-Evaluation 03/29/20    Authorization Type UHC Medicare/ Medicaid, covered 100% (medicare guidelines)    Authorization Time Period cert date 85/0/27-74/12/87    Authorization - Number of Visits 32    Progress Note Due on Visit 75    OT Start Time 1020    OT Stop Time 1100    OT Time Calculation (min) 40 min    Activity Tolerance Patient tolerated treatment well    Behavior During Therapy [name] for tasks assessed/performed           Past Medical History:  Diagnosis Date  . Allergy   . Anxiety   . Asthma   . Back pain   . Breast cancer ([place])   . Cancer ([place])   . Depression   . Diabetes mellitus   . Headache   . Hyperlipemia   . Osteopenia   . Osteoporosis    osteopenia  . Stroke [facility])     Past Surgical History:  Procedure Laterality Date  . ABDOMINAL HYSTERECTOMY     B oophorectomy for BRCA1 gene  . BREAST SURGERY    . CESAREAN SECTION    . INCISION AND DRAINAGE ABSCESS Left 11/14/2012   Procedure: INCISION AND DRAINAGE ABSCESS LEFT GROIN;  Surgeon: Jamesetta So, MD;  Location: [facility];  Service: General;  Laterality: Left;  . INGUINAL LYMPH NODE BIOPSY Right 11/23/2019   Procedure: INGUINAL LYMPH NODE BIOPSY;  Surgeon: Aviva Signs, MD;  Location: [facility];  Service: General;  Laterality: Right;  . IR ANGIO VERTEBRAL SEL SUBCLAVIAN INNOMINATE UNI R MOD SED  03/02/2019  . IR CT HEAD LTD  03/02/2019  . IR INTRAVSC STENT CERV CAROTID W/O EMB-PROT MOD SED INC ANGIO  03/02/2019  . IR PERCUTANEOUS ART THROMBECTOMY/INFUSION INTRACRANIAL  INC DIAG ANGIO  03/02/2019  . LYMPHADENECTOMY    . MASTECTOMY Bilateral   . RADIOLOGY WITH ANESTHESIA N/A 03/02/2019   Procedure: IR WITH ANESTHESIA;  Surgeon: Luanne Bras, MD;  Location: [facility];  Service: Radiology;  Laterality: N/A;    There were no vitals filed for this visit.   Subjective Assessment - 03/15/20 1019    Subjective  nothing new    Patient is accompanied by: Family member    Pertinent History R MCA  stroke with L shoulder pain.  PMH:  diabetes, anxiety, depression hyperlipidemia, smoking, breast cancer s/p bilateral mastectomy followed by chmo in 2009, R carotid stenosis requiring emergent thrombectomy    Limitations fall risk, pain    Patient Stated Goals improve L shoudler pain and ROM, be able to care for grandchild alone    Currently in Pain? No/denies   at rest, occasional mild pain reported but goal was pain free ranges   Pain Onset --              Supine gentle joint mobs to L shoulder and soft tissue mobilization to upper traps and boarder of scapula  Supine AROM in diagonal patterns/across body with wt shift for incr scapular movement.  LUE stabilized with wt. Bearing and performing  body on arm movements for roll.    Sitting,  Wt. Bearing through L hand on mat with body on arm movements for scapular stabilization followed by wt. Bearing through wall in standing in abduction with body on arm movements with min cueing.  Standing, UE ranger for high range shoulder flex and abduction with min cueing for scapular depression.  Standing, functional  Overhead reaching to place large pegs in pegboard.  Quadruped, forward/backward wt. Shift for incr scapular depression/mobility and decr tone.             OT Education - 03/15/20 1057    Education Details Reviewed yellow theraband HEP    Person(s) Educated Patient    Methods Explanation;Demonstration;Verbal cues    Comprehension Verbalized understanding;Returned demonstration            OT  Short Term Goals - 03/02/20 1755      OT SHORT TERM GOAL #1   Title Pt will be independent with initial HEP.--check STGs 11/13/19 (extended due to scheduling)    Time 4    Period Weeks    Status Achieved      OT SHORT TERM GOAL #2   Title Pt will demo at least 70* L shoulder flexion for functional reaching without pain.    Baseline 40*    Time 4    Period Weeks    Status Achieved   10/29/19:  approx 90*     OT SHORT TERM GOAL #3   Title Pt will verbalize understanding of proper positioning of LUE for decr pain.    Time 4    Period Weeks    Status Achieved      OT SHORT TERM GOAL #4   Title Pt will be able to use LUE in simple light home maintenance tasks as non-dominant assist at least 50% of the time with pain less than 3/10 (wiping table, folding clothes, sweeping).    Time 4    Period Weeks    Status Achieved      OT SHORT TERM GOAL #5   Title Pt will demo improved LUE functional reaching for ADLs as shown by improving score on box and blocks test by at least 6.    Baseline 28 blocks    Time 4    Period Weeks    Status Achieved   36     OT SHORT TERM GOAL #6   Title Pt will verbalize understanding of adaptive strategies/AE to incr independence with cooking tasks.    Time 4    Period Weeks    Status Achieved             OT Long Term Goals - 03/15/20 1023      OT LONG TERM GOAL #1   Title Pt will be independent with updated HEP for LUE--check LTGs 11/24/19    Time 8    Period Weeks    Status Achieved      OT LONG TERM GOAL #2   Title Pt will demo at least 90* L shoulder flex without pain for functional reaching.--UPGRADED to 100* 01/28/20    Baseline 40* initally. 80* 12/15/19 seated.  90* supine 12/15/19    Time 8    Period Weeks    Status Achieved   03/08/20:  120*     OT LONG TERM GOAL #3   Title Pt will be able to use LUE as nondominant assist for functional tasks at least 75% of the time for light ADLs and IADLs with  pain consistently less than or equal to  2/10.    Baseline less than 50%  01/06/20, patient reports 50% 02/16/20    Time 8    Period Weeks    Status Partially Met   03/15/20:  approx at this level, 3-4/10 pain at times     OT LONG TERM GOAL #4   Title Pt will improve L grip strength by at least 8lbs to assist in opening containers/home maintenance tasks.    Baseline 42.7lbs initially, 46.7 12/15/19    Time 8    Period Weeks    Status Not Met   03/15/20:  42.1lbs     OT LONG TERM GOAL #5   Title Pt will improve LUE coordination as shown by improving time on 9-hole peg test by at least 8sec.    Baseline 40.81sec initally, 38.78 12/15/19, 02/16/20 - 32.00    Time 8    Period Weeks    Status Achieved      OT LONG TERM GOAL #6   Title Pt will improve LUE functional reaching for ADLs as shown by improving score on box and blocks test by at least 10.    Baseline 28 initially, 39 - 12/15/19    Time 8    Period Weeks    Status Achieved      OT LONG TERM GOAL #7   Title Patient will report improved perfomance in donning a bra using BUE method and increased time due 03/29/20    Time 6    Period Weeks    Status Achieved   03/08/20     OT LONG TERM GOAL #8   Title Patient will self report improved performance of left hand effectively on steering wheel when driving    Time 6    Period Weeks    Status On-going      OT LONG TERM GOAL  #9   TITLE Patient will demonstrate improved sustained shoulder flexion/scaption to allow her to comb, dry, style hair with LUE    Time 6    Period Weeks    Status Achieved                 Plan - 03/15/20 1053    Clinical Impression Statement Pt continues to report L lateral shoulder pain with LUE use, but is using LUE more.    OT Occupational Profile and History Detailed Assessment- Review of Records and additional review of physical, cognitive, psychosocial history related to current functional performance    Occupational performance deficits (Please refer to evaluation for details):  IADL's;ADL's;Leisure    Body Structure / Function / Physical Skills ADL;Balance;IADL;ROM;Strength;Tone;FMC;Coordination;UE functional use;Decreased knowledge of precautions;GMC;Decreased knowledge of use of DME;Pain    Rehab Potential Good    Clinical Decision Making Several treatment options, min-mod task modification necessary    Comorbidities Affecting Occupational Performance: May have comorbidities impacting occupational performance    Modification or Assistance to Complete Evaluation  Min-Moderate modification of tasks or assist with assess necessary to complete eval    OT Frequency 2x / week    OT Duration 6 weeks    OT Treatment/Interventions Self-care/ADL training;Moist Heat;DME and/or AE instruction;Therapeutic activities;Aquatic Therapy;Therapeutic exercise;Ultrasound;Passive range of motion;Functional Mobility Training;Neuromuscular education;Cryotherapy;Electrical Stimulation;Manual Therapy;Patient/family education    Plan check remaining goals and d/c OT    OT Home Exercise Plan supine bilateral shoulder exercises    Consulted and Agree with Plan of Care Patient           Patient will benefit from  skilled therapeutic intervention in order to improve the following deficits and impairments:   Body Structure / Function / Physical Skills: ADL, Balance, IADL, ROM, Strength, Tone, FMC, Coordination, UE functional use, Decreased knowledge of precautions, GMC, Decreased knowledge of use of DME, Pain       Visit Diagnosis: Hemiplegia and hemiparesis following cerebral infarction affecting left non-dominant side (HCC)  Acute pain of left shoulder  Abnormal posture  Other lack of coordination  Other symptoms and signs involving the nervous system  Other symptoms and signs involving the musculoskeletal system    Problem List Patient Active Problem List   Diagnosis Date Noted  . Lymph node enlargement   . Cellulitis of groin, right   . Cellulitis   . Sepsis ([place])  11/20/2019  . Acute inguinal lymphadenitis   . Vascular headache   . Other chronic pain   . Uncontrolled type 2 diabetes mellitus with hyperglycemia ([place])   . Labile blood glucose   . Panic disorder with agoraphobia and moderate panic attacks   . Major depressive disorder, recurrent episode, moderate ([place])   . MCI (mild cognitive impairment) with memory loss   . Acute ischemic right middle cerebral artery (MCA) stroke ([place]) 03/09/2019  . Left hemiparesis ([place])   . Bradycardia   . Tachypnea   . SIRS (systemic inflammatory response syndrome) (HCC)   . Hypokalemia   . Diabetes mellitus type 2 in obese ([place])   . Tobacco abuse   . History of breast cancer   . Cognitive deficits   . Dysphagia, post-stroke   . Stroke (cerebrum) ([place]) 03/02/2019  . Middle cerebral artery embolism, right 03/02/2019  . Chest pain 12/19/2015  . Numbness 12/19/2015  . Femoral hernia 03/24/2014  . Dyslipidemia 09/10/2011  . BRCA1 positive 09/10/2011  . Breast cancer ([place]) 09/10/2011  . H/O gastric bypass; 05/14/11([facility]) 09/10/2011  . Essential hypertension 10/28/2008  . DEPRESSION/ANXIETY 02/19/2007  . CARPAL TUNNEL SYNDROME, RIGHT 02/19/2007  . ALLERGIC RHINITIS 02/19/2007  . GERD 02/19/2007  . IRRITABLE BOWEL SYNDROME 02/19/2007  . HERPES GENITALIS 12/30/2006  . Diabetes ([place]) 12/30/2006  . OSTEOPENIA 12/30/2006  . ANOMALY, CONGENITAL, NERVOUS SYSTEM NEC 12/30/2006    [facility] 03/15/2020, 2:03 PM  [facility] [address] [place], [place], [address] Phone: [phone]   Fax:  [phone]  Name: Ann Lopez MRN: [id number] Date of Birth: 1970/08/04   Vianne Bulls, OTR/L [facility] [address] [place] [place], [place]  [address] [phone] phone [phone] 03/15/20 2:03 PM

## 2020-03-17 ENCOUNTER — Encounter: Payer: Self-pay | Admitting: Occupational Therapy

## 2020-03-17 ENCOUNTER — Telehealth: Payer: Self-pay

## 2020-03-17 ENCOUNTER — Ambulatory Visit: Payer: Medicare Other | Admitting: Occupational Therapy

## 2020-03-17 ENCOUNTER — Other Ambulatory Visit: Payer: Self-pay

## 2020-03-17 DIAGNOSIS — I69354 Hemiplegia and hemiparesis following cerebral infarction affecting left non-dominant side: Secondary | ICD-10-CM

## 2020-03-17 DIAGNOSIS — R293 Abnormal posture: Secondary | ICD-10-CM

## 2020-03-17 DIAGNOSIS — M25512 Pain in left shoulder: Secondary | ICD-10-CM

## 2020-03-17 DIAGNOSIS — R29898 Other symptoms and signs involving the musculoskeletal system: Secondary | ICD-10-CM

## 2020-03-17 DIAGNOSIS — M6281 Muscle weakness (generalized): Secondary | ICD-10-CM

## 2020-03-17 DIAGNOSIS — R29818 Other symptoms and signs involving the nervous system: Secondary | ICD-10-CM

## 2020-03-17 DIAGNOSIS — R278 Other lack of coordination: Secondary | ICD-10-CM

## 2020-03-17 DIAGNOSIS — R2681 Unsteadiness on feet: Secondary | ICD-10-CM

## 2020-03-17 NOTE — Therapy (Signed)
[facility] [address] [place], [place], [address] Phone: [phone]   Fax:  [phone]  Occupational Therapy Treatment  Patient Details  Name: Ann Lopez MRN: [id number] Date of Birth: 08-21-1970 Referring Provider (OT): Dr. Leeroy Cha   Encounter Date: 03/17/2020   OT End of Session - 03/17/20 1614    Visit Number 33    Number of Visits 40    Date for OT Re-Evaluation 03/29/20    Authorization Type UHC Medicare/ Medicaid, covered 100% (medicare guidelines)    Authorization Time Period cert date 25/8/52-77/82/42    Authorization - Number of Visits 33    Progress Note Due on Visit 33    OT Start Time 1531    OT Stop Time 1610    OT Time Calculation (min) 39 min    Activity Tolerance Patient tolerated treatment well    Behavior During Therapy [facility] for tasks assessed/performed           Past Medical History:  Diagnosis Date  . Allergy   . Anxiety   . Asthma   . Back pain   . Breast cancer ([place])   . Cancer ([place])   . Depression   . Diabetes mellitus   . Headache   . Hyperlipemia   . Osteopenia   . Osteoporosis    osteopenia  . Stroke [facility])     Past Surgical History:  Procedure Laterality Date  . ABDOMINAL HYSTERECTOMY     B oophorectomy for BRCA1 gene  . BREAST SURGERY    . CESAREAN SECTION    . INCISION AND DRAINAGE ABSCESS Left 11/14/2012   Procedure: INCISION AND DRAINAGE ABSCESS LEFT GROIN;  Surgeon: Jamesetta So, MD;  Location: [facility];  Service: General;  Laterality: Left;  . INGUINAL LYMPH NODE BIOPSY Right 11/23/2019   Procedure: INGUINAL LYMPH NODE BIOPSY;  Surgeon: Aviva Signs, MD;  Location: [facility];  Service: General;  Laterality: Right;  . IR ANGIO VERTEBRAL SEL SUBCLAVIAN INNOMINATE UNI R MOD SED  03/02/2019  . IR CT HEAD LTD  03/02/2019  . IR INTRAVSC STENT CERV CAROTID W/O EMB-PROT MOD SED INC ANGIO  03/02/2019  . IR PERCUTANEOUS ART THROMBECTOMY/INFUSION INTRACRANIAL  INC DIAG ANGIO  03/02/2019  . LYMPHADENECTOMY    . MASTECTOMY Bilateral   . RADIOLOGY WITH ANESTHESIA N/A 03/02/2019   Procedure: IR WITH ANESTHESIA;  Surgeon: Luanne Bras, MD;  Location: [facility];  Service: Radiology;  Laterality: N/A;    There were no vitals filed for this visit.   Subjective Assessment - 03/17/20 1540    Subjective  I wish I was further ahead    Pertinent History R MCA  stroke with L shoulder pain.  PMH:  diabetes, anxiety, depression hyperlipidemia, smoking, breast cancer s/p bilateral mastectomy followed by chmo in 2009, R carotid stenosis requiring emergent thrombectomy    Limitations pain    Patient Stated Goals improve L shoudler pain and ROM, be able to care for grandchild alone    Currently in Pain? No/denies    Pain Score 0-No pain                        OT Treatments/Exercises (OP) - 03/17/20 0001      ADLs   ADL Comments Reviewed remaining goals.  Patient has met all goals and is agreeable to OT discharge.      Manual Therapy   Manual therapy comments Soft tissue mobilization to address humeral  head rotation in supine. Patient able to tolerate 50% ER this visit.                  OT Education - 03/17/20 1614    Education Details reviewed reamining goals and plan for discharge    Person(s) Educated Patient    Methods Explanation    Comprehension Verbalized understanding            OT Short Term Goals - 03/17/20 1615      OT SHORT TERM GOAL #1   Title Pt will be independent with initial HEP.--check STGs 11/13/19 (extended due to scheduling)    Time 4    Period Weeks    Status Achieved      OT SHORT TERM GOAL #2   Title Pt will demo at least 70* L shoulder flexion for functional reaching without pain.    Baseline 40*    Time 4    Period Weeks    Status Achieved   10/29/19:  approx 90*     OT SHORT TERM GOAL #3   Title Pt will verbalize understanding of proper positioning of LUE for decr pain.    Time 4    Period  Weeks    Status Achieved      OT SHORT TERM GOAL #4   Title Pt will be able to use LUE in simple light home maintenance tasks as non-dominant assist at least 50% of the time with pain less than 3/10 (wiping table, folding clothes, sweeping).    Time 4    Period Weeks    Status Achieved      OT SHORT TERM GOAL #5   Title Pt will demo improved LUE functional reaching for ADLs as shown by improving score on box and blocks test by at least 6.    Baseline 28 blocks    Time 4    Period Weeks    Status Achieved   36     OT SHORT TERM GOAL #6   Title Pt will verbalize understanding of adaptive strategies/AE to incr independence with cooking tasks.    Time 4    Period Weeks    Status Achieved             OT Long Term Goals - 03/17/20 1543      OT LONG TERM GOAL #1   Title Pt will be independent with updated HEP for LUE--check LTGs 11/24/19    Time 8    Period Weeks    Status Achieved      OT LONG TERM GOAL #2   Title Pt will demo at least 90* L shoulder flex without pain for functional reaching.--UPGRADED to 100* 01/28/20    Baseline 40* initally. 80* 12/15/19 seated.  90* supine 12/15/19    Time 8    Period Weeks    Status Achieved      OT LONG TERM GOAL #3   Title Pt will be able to use LUE as nondominant assist for functional tasks at least 75% of the time for light ADLs and IADLs with pain consistently less than or equal to 2/10.    Baseline less than 50%  01/06/20, patient reports 50% 02/16/20, 03/17/20 - 75%    Time 8    Period Weeks    Status Achieved      OT LONG TERM GOAL #4   Title Pt will improve L grip strength by at least 8lbs to assist in opening containers/home maintenance tasks.  Baseline 42.7lbs initially, 46.7 12/15/19, 55 lb 12/9    Time 8    Period Weeks    Status Achieved      OT LONG TERM GOAL #5   Title Pt will improve LUE coordination as shown by improving time on 9-hole peg test by at least 8sec.    Baseline 40.81sec initally, 38.78 12/15/19, 02/16/20 -  32.00    Time 8    Period Weeks    Status Achieved      OT LONG TERM GOAL #6   Title Pt will improve LUE functional reaching for ADLs as shown by improving score on box and blocks test by at least 10.    Baseline 28 initially, 39 - 12/15/19    Time 8    Period Weeks    Status Achieved      OT LONG TERM GOAL #7   Title Patient will report improved perfomance in donning a bra using BUE method and increased time due 03/29/20    Time 6    Period Weeks    Status Achieved      OT LONG TERM GOAL #8   Title Patient will self report improved performance of left hand effectively on steering wheel when driving    Time 6    Period Weeks    Status Achieved      OT LONG TERM GOAL  #9   TITLE Patient will demonstrate improved sustained shoulder flexion/scaption to allow her to comb, dry, style hair with LUE    Time 6    Period Weeks    Status Achieved                 Plan - 03/17/20 1614    Clinical Impression Statement Pt continues to report L lateral shoulder pain with LUE use, but is using LUE more.  All OT goals met.  Agreeable to discharge    OT Occupational Profile and History Detailed Assessment- Review of Records and additional review of physical, cognitive, psychosocial history related to current functional performance    Occupational performance deficits (Please refer to evaluation for details): IADL's;ADL's;Leisure    Body Structure / Function / Physical Skills ADL;Balance;IADL;ROM;Strength;Tone;FMC;Coordination;UE functional use;Decreased knowledge of precautions;GMC;Decreased knowledge of use of DME;Pain    Rehab Potential Good    Clinical Decision Making Several treatment options, min-mod task modification necessary    Comorbidities Affecting Occupational Performance: May have comorbidities impacting occupational performance    Modification or Assistance to Complete Evaluation  Min-Moderate modification of tasks or assist with assess necessary to complete eval    OT  Frequency 2x / week    OT Duration 6 weeks    OT Treatment/Interventions Self-care/ADL training;Moist Heat;DME and/or AE instruction;Therapeutic activities;Aquatic Therapy;Therapeutic exercise;Ultrasound;Passive range of motion;Functional Mobility Training;Neuromuscular education;Cryotherapy;Electrical Stimulation;Manual Therapy;Patient/family education    Plan d/c OT    OT Home Exercise Plan supine bilateral shoulder exercises    Consulted and Agree with Plan of Care Patient           Patient will benefit from skilled therapeutic intervention in order to improve the following deficits and impairments:   Body Structure / Function / Physical Skills: ADL,Balance,IADL,ROM,Strength,Tone,FMC,Coordination,UE functional use,Decreased knowledge of precautions,GMC,Decreased knowledge of use of DME,Pain       Visit Diagnosis: Hemiplegia and hemiparesis following cerebral infarction affecting left non-dominant side (HCC)  Acute pain of left shoulder  Abnormal posture  Other lack of coordination  Other symptoms and signs involving the nervous system  Other symptoms and signs involving  the musculoskeletal system  Muscle weakness (generalized)  Unsteadiness on feet    Problem List Patient Active Problem List   Diagnosis Date Noted  . Lymph node enlargement   . Cellulitis of groin, right   . Cellulitis   . Sepsis ([place]) 11/20/2019  . Acute inguinal lymphadenitis   . Vascular headache   . Other chronic pain   . Uncontrolled type 2 diabetes mellitus with hyperglycemia ([place])   . Labile blood glucose   . Panic disorder with agoraphobia and moderate panic attacks   . Major depressive disorder, recurrent episode, moderate ([place])   . MCI (mild cognitive impairment) with memory loss   . Acute ischemic right middle cerebral artery (MCA) stroke ([place]) 03/09/2019  . Left hemiparesis ([place])   . Bradycardia   . Tachypnea   . SIRS (systemic inflammatory response syndrome) (HCC)   . Hypokalemia    . Diabetes mellitus type 2 in obese ([place])   . Tobacco abuse   . History of breast cancer   . Cognitive deficits   . Dysphagia, post-stroke   . Stroke (cerebrum) ([place]) 03/02/2019  . Middle cerebral artery embolism, right 03/02/2019  . Chest pain 12/19/2015  . Numbness 12/19/2015  . Femoral hernia 03/24/2014  . Dyslipidemia 09/10/2011  . BRCA1 positive 09/10/2011  . Breast cancer ([place]) 09/10/2011  . H/O gastric bypass; 05/14/11([facility]) 09/10/2011  . Essential hypertension 10/28/2008  . DEPRESSION/ANXIETY 02/19/2007  . CARPAL TUNNEL SYNDROME, RIGHT 02/19/2007  . ALLERGIC RHINITIS 02/19/2007  . GERD 02/19/2007  . IRRITABLE BOWEL SYNDROME 02/19/2007  . HERPES GENITALIS 12/30/2006  . Diabetes ([place]) 12/30/2006  . OSTEOPENIA 12/30/2006  . ANOMALY, CONGENITAL, NERVOUS SYSTEM NEC 12/30/2006  OCCUPATIONAL THERAPY DISCHARGE SUMMARY  Visits from Start of Care: 33  Current functional level related to goals / functional outcomes:  All goals met   Remaining deficits: Pain and reduced range of motion Left shoulder   Education / Equipment: HEP Plan: Patient agrees to discharge.  Patient goals were met. Patient is being discharged due to meeting the stated rehab goals.  ?????      Mariah Milling 03/17/2020, 4:17 PM  [facility] [address] [place], [place], [address] Phone: [phone]   Fax:  [phone]  Name: KATHERLEEN FOLKES MRN: [id number] Date of Birth: [birth year]

## 2020-03-17 NOTE — Telephone Encounter (Signed)
Pharmacy sent fax pt is requesting new prescription Duloxetine HCL DR 60 mg cap take 1 capsule by mouth every day #90

## 2020-03-21 ENCOUNTER — Other Ambulatory Visit: Payer: Self-pay | Admitting: Cardiovascular Disease

## 2020-03-23 ENCOUNTER — Other Ambulatory Visit: Payer: Self-pay | Admitting: *Deleted

## 2020-03-23 MED ORDER — DULOXETINE HCL 60 MG PO CPEP: 60.0000 mg | ORAL_CAPSULE | Freq: Every day | ORAL | 0 refills | Status: DC

## 2020-03-23 NOTE — Telephone Encounter (Signed)
Rx has been sent to the pharmacy electronically. ° °

## 2020-03-24 ENCOUNTER — Other Ambulatory Visit: Payer: Self-pay | Admitting: Physical Medicine and Rehabilitation

## 2020-04-12 ENCOUNTER — Other Ambulatory Visit (HOSPITAL_COMMUNITY): Payer: Self-pay | Admitting: Interventional Radiology

## 2020-04-12 DIAGNOSIS — I639 Cerebral infarction, unspecified: Secondary | ICD-10-CM

## 2020-04-18 ENCOUNTER — Ambulatory Visit (HOSPITAL_COMMUNITY)
Admission: RE | Admit: 2020-04-18 | Discharge: 2020-04-18 | Disposition: A | Discharge status: Home / Self Care | Payer: Medicare Other | Source: Ambulatory Visit | Attending: Interventional Radiology | Admitting: Interventional Radiology

## 2020-04-18 ENCOUNTER — Other Ambulatory Visit: Payer: Self-pay

## 2020-04-18 DIAGNOSIS — I639 Cerebral infarction, unspecified: Secondary | ICD-10-CM | POA: Diagnosis not present

## 2020-04-19 ENCOUNTER — Telehealth (HOSPITAL_COMMUNITY): Payer: Self-pay

## 2020-04-19 NOTE — Telephone Encounter (Signed)
Called pt regarding recent imaging, no answer, left vm. AW  

## 2020-04-21 ENCOUNTER — Encounter: Payer: Self-pay | Admitting: Physical Medicine and Rehabilitation

## 2020-04-21 ENCOUNTER — Other Ambulatory Visit: Payer: Self-pay

## 2020-04-21 ENCOUNTER — Encounter
Payer: Medicare Other | Attending: Physical Medicine and Rehabilitation | Admitting: Physical Medicine and Rehabilitation

## 2020-04-21 VITALS — BP 114/74 | HR 77 | Temp 97.9°F | Ht 63.0 in | Wt 157.0 lb

## 2020-04-21 DIAGNOSIS — F341 Dysthymic disorder: Secondary | ICD-10-CM | POA: Diagnosis not present

## 2020-04-21 DIAGNOSIS — G4701 Insomnia due to medical condition: Secondary | ICD-10-CM | POA: Insufficient documentation

## 2020-04-21 DIAGNOSIS — M7502 Adhesive capsulitis of left shoulder: Secondary | ICD-10-CM | POA: Insufficient documentation

## 2020-04-21 DIAGNOSIS — I63511 Cerebral infarction due to unspecified occlusion or stenosis of right middle cerebral artery: Secondary | ICD-10-CM | POA: Diagnosis present

## 2020-04-21 NOTE — Patient Instructions (Addendum)
Blue emu oil ° °-Discussed following foods that may reduce pain: °1) Ginger °2) Blueberries °3) Salmon °4) Pumpkin seeds °5) dark chocolate °6) turmeric °7) tart cherries °8) virgin olive oil °9) chilli peppers °10) mint ° ° °

## 2020-04-21 NOTE — Progress Notes (Signed)
Subjective:    Patient ID: Ann Lopez, female    DOB: Oct 06, 1970, 50 y.o.   MRN: [id number]  HPI  Ann Lopez is a 50 year old woman who presents for follow-up of shoulder pain.  1) Left shoulder adhesive capsulitis: -SPRINT PNS removed without complications -She has noted benefit in pain in axillary distribution. -She continues to have pain in the distribution of the suprascapular nerve.  -She has had improved strength in her left shoulder. She loves her therapy, from which she gas graduated.  -She wants the full movement back in her arm back. Her strength is much improved since she participated in the [employer] trial to improve her mobility.  -She had injection and nerve block previously with excellent relief as well.   2) Insomnia: Improved.   3) Impaired mobility and ADLs: She has been able to walk more. She tries to walk as much as she can. She walks 45 minutes per day. She is trying to get rid of her limp  4) Depression: has been stable.    Pain Inventory Average Pain 0 Pain Right Now 0 My pain is no psain  In the last 24 hours, has pain interfered with the following? General activity 0 Relation with others 0 Enjoyment of life 0 What TIME of day is your pain at its worst? varies Sleep (in general) Fair  Pain is worse with: no pain Pain improves with: no pain Relief from Meds: no pain  Family History  Problem Relation Age of Onset  . Cancer Mother 90       breast cancer  . Hypertension Sister   . Hypothyroidism Sister   . Cancer Sister   . Hypothyroidism Brother   . Cancer Maternal Grandmother 18       breast cancer  . Cancer Maternal Grandfather 74       pancreatic cancer   Social History   Socioeconomic History  . Marital status: Single    Spouse name: Not on file  . Number of children: 4  . Years of education: 48  . Highest education level: Not on file  Occupational History  . Occupation: Disabled  Tobacco Use  . Smoking status: Former  Smoker    Packs/day: 0.50    Types: Cigarettes    Quit date: 03/02/2019    Years since quitting: 1.1  . Smokeless tobacco: Never Used  Substance and Sexual Activity  . Alcohol use: No    Alcohol/week: 0.0 standard drinks  . Drug use: No  . Sexual activity: Not Currently  Other Topics Concern  . Not on file  Social History Narrative   Marital status: single      Lives: at home with her four sons (12, 40, 65, 18); no grandchildren      Employment: disability for breast cancer, DDD lumbar, anxiety/panic attacks      Tobacco: 1 ppd       Alcohol: none         Right-handed.   2-3 cups caffeine daily.   Social Determinants of Health   Financial Resource Strain: Not on file  Food Insecurity: Not on file  Transportation Needs: Not on file  Physical Activity: Not on file  Stress: Not on file  Social Connections: Not on file   Past Surgical History:  Procedure Laterality Date  . ABDOMINAL HYSTERECTOMY     B oophorectomy for BRCA1 gene  . BREAST SURGERY    . CESAREAN SECTION    . INCISION AND  DRAINAGE ABSCESS Left 11/14/2012   Procedure: INCISION AND DRAINAGE ABSCESS LEFT GROIN;  Surgeon: Jamesetta So, MD;  Location: [facility];  Service: General;  Laterality: Left;  . INGUINAL LYMPH NODE BIOPSY Right 11/23/2019   Procedure: INGUINAL LYMPH NODE BIOPSY;  Surgeon: Aviva Signs, MD;  Location: [facility];  Service: General;  Laterality: Right;  . IR ANGIO VERTEBRAL SEL SUBCLAVIAN INNOMINATE UNI R MOD SED  03/02/2019  . IR CT HEAD LTD  03/02/2019  . IR INTRAVSC STENT CERV CAROTID W/O EMB-PROT MOD SED INC ANGIO  03/02/2019  . IR PERCUTANEOUS ART THROMBECTOMY/INFUSION INTRACRANIAL INC DIAG ANGIO  03/02/2019  . LYMPHADENECTOMY    . MASTECTOMY Bilateral   . RADIOLOGY WITH ANESTHESIA N/A 03/02/2019   Procedure: IR WITH ANESTHESIA;  Surgeon: Luanne Bras, MD;  Location: [place];  Service: Radiology;  Laterality: N/A;   Past Surgical History:  Procedure Laterality Date  . ABDOMINAL  HYSTERECTOMY     B oophorectomy for BRCA1 gene  . BREAST SURGERY    . CESAREAN SECTION    . INCISION AND DRAINAGE ABSCESS Left 11/14/2012   Procedure: INCISION AND DRAINAGE ABSCESS LEFT GROIN;  Surgeon: Jamesetta So, MD;  Location: [facility];  Service: General;  Laterality: Left;  . INGUINAL LYMPH NODE BIOPSY Right 11/23/2019   Procedure: INGUINAL LYMPH NODE BIOPSY;  Surgeon: Aviva Signs, MD;  Location: [facility];  Service: General;  Laterality: Right;  . IR ANGIO VERTEBRAL SEL SUBCLAVIAN INNOMINATE UNI R MOD SED  03/02/2019  . IR CT HEAD LTD  03/02/2019  . IR INTRAVSC STENT CERV CAROTID W/O EMB-PROT MOD SED INC ANGIO  03/02/2019  . IR PERCUTANEOUS ART THROMBECTOMY/INFUSION INTRACRANIAL INC DIAG ANGIO  03/02/2019  . LYMPHADENECTOMY    . MASTECTOMY Bilateral   . RADIOLOGY WITH ANESTHESIA N/A 03/02/2019   Procedure: IR WITH ANESTHESIA;  Surgeon: Luanne Bras, MD;  Location: [place];  Service: Radiology;  Laterality: N/A;   Past Medical History:  Diagnosis Date  . Allergy   . Anxiety   . Asthma   . Back pain   . Breast cancer ([place])   . Cancer ([place])   . Depression   . Diabetes mellitus   . Headache   . Hyperlipemia   . Osteopenia   . Osteoporosis    osteopenia  . Stroke [facility])    There were no vitals taken for this visit.  Opioid Risk Score:   Fall Risk Score:  `1  Depression screen PHQ 2/9  Depression screen [facility] 2/9 01/04/2016 12/06/2015 10/27/2015 09/29/2015 08/30/2015 07/28/2015 06/28/2015  Decreased Interest 0 0 0 0 0 0 0  Down, Depressed, Hopeless 0 0 1 0 0 0 0  PHQ - 2 Score 0 0 1 0 0 0 0  Altered sleeping - - - - - - -  Tired, decreased energy - - - - - - -  Change in appetite - - - - - - -  Feeling bad or failure about yourself  - - - - - - -  Trouble concentrating - - - - - - -  Moving slowly or fidgety/restless - - - - - - -  Suicidal thoughts - - - - - - -  PHQ-9 Score - - - - - - -  Difficult doing work/chores - - - - - - -  Some recent data might be hidden     Review of Systems  Constitutional: Negative.   HENT: Negative.   Eyes: Negative.   Respiratory: Negative.  Cardiovascular: Negative.   Gastrointestinal: Negative.   Endocrine: Negative.   Genitourinary: Negative.   Musculoskeletal: Negative.   Skin: Negative.   Allergic/Immunologic: Negative.   Neurological: Negative.   Hematological: Negative.   Psychiatric/Behavioral: Negative.   All other systems reviewed and are negative.      Objective:   Physical Exam Gen: no distress, normal appearing HEENT: oral mucosa pink and moist, NCAT Cardio: Reg rate Chest: normal effort, normal rate of breathing Abd: soft, non-distended Ext: no edema Psych: pleasant, normal affect Skin: Residual scab and small opening at SPRINT PNS removal site Neuro: Alert and oriented x3. Musculoskeletal: left shoulder with 4/5 SAB and severely limited external rotation. Otherwise with 5/5 strength throughout which is great improvement from last visit! Tight cervical musculature- no significant trigger points.  Psych: pleasant, normal affect, much more positive than last visit!     Assessment & Plan:  Ann Lopez is a 50 year old woman who presents with shoulder pain post-stroke.   1) Adhesive capsulitis of left shoulder: -He strength is much improved since last visit and she does not exhibit significant LUE spasticity. I think her pain is most likely due to frozen shoulder, especially given her severely limited external rotation.  -Continue current physical therapy.  -Can repeat steroid injection and/or nerve block in 1 month. Discussed risks of repeat steroid injections. -SPRINT PNS has provided improvement in pain in axillary distribution. Device removed successfully today. Discussed that we can consider suprascapular nerve in the future if pain becomes severe.  -Continue PRN meloxicam, lidocaine patch, and diclofenac gel for pain relief in interim.   2) Impaired mobility and ADLs -Continue daily  45 minute walk -Advised to lift weights to maintain muscle mass and good metabolism.   3) Insomnia: -Recommended walk outside every morning to help reset circadian rhythm -Commended on weaning off Temazepam  4) Depression: -Advised that exercise will help with her depression as well.

## 2020-05-23 ENCOUNTER — Ambulatory Visit (INDEPENDENT_AMBULATORY_CARE_PROVIDER_SITE_OTHER): Payer: Medicare Other | Admitting: Adult Health

## 2020-05-23 ENCOUNTER — Encounter: Payer: Self-pay | Admitting: Adult Health

## 2020-05-23 VITALS — BP 114/79 | HR 67 | Ht 62.0 in | Wt 155.0 lb

## 2020-05-23 DIAGNOSIS — I6521 Occlusion and stenosis of right carotid artery: Secondary | ICD-10-CM | POA: Diagnosis not present

## 2020-05-23 DIAGNOSIS — Z8673 Personal history of transient ischemic attack (TIA), and cerebral infarction without residual deficits: Secondary | ICD-10-CM

## 2020-05-23 NOTE — Patient Instructions (Signed)
Continue aspirin 81 mg daily  and Crestor for secondary stroke prevention  Continue Brilinta per Dr. Arlean Hopping recommendation after stent placement - I will attempt to reach out to them requesting to follow up with you in regards to recent carotid ultrasound, follow-up visit and repeat carotid ultrasound recommendations and ongoing need of Brilinta  Continue to follow up with PCP regarding cholesterol, blood pressure and diabetes management  Maintain strict control of hypertension with blood pressure goal below 130/90, diabetes with hemoglobin A1c goal below 6.5% and cholesterol with LDL cholesterol (bad cholesterol) goal below 70 mg/dL.       Followup in the future with me in 6 months or call earlier if needed       Thank you for coming to see [place] at [facility]. I hope we have been able to provide you high quality care today.  You may receive a patient satisfaction survey over the next few weeks. We would appreciate your feedback and comments so that we may continue to improve ourselves and the health of our patients.

## 2020-05-23 NOTE — Progress Notes (Signed)
[facility] [address] [place]. [place] [address] [phone]       OFFICE FOLLOW-UP NOTE  Ann Lopez Date of Birth:  July 08, 1970 Medical Record Number:  [id number]    Reason for visit: Stroke   Chief complaint: Chief Complaint  Patient presents with  . Follow-up    TR alone Pt is well, L arm still limited to movement       HPI:   Today, 05/23/2020, Ann Lopez returns for stroke follow up unaccompanied. Stable since prior visit without new stroke/TIA symptoms and reports residual left frozen shoulder but otherwise excellent improvement of left-sided weakness and gait.  She ambulates now without assistive device and denies any recent falls.  Completed OT as she met stated rehab goals with residual pain and reduced left shoulder ROM.  Followed by PMR Dr. Ranell Patrick for left shoulder adhesive capsulitis with plans on repeating steroid injection and/or nerve block at follow-up visit.  Remains on aspirin and Crestor for secondary stroke prevention of side effects.  Blood pressure today 114/79.  Glucose levels have greatly improved with reporting recent A1c 6.6 (unable to personally view via epic).  Remains on Brilinta per IR recommendations s/p stent with recent carotid duplex showing stable appearance -she is not sure of ongoing need of Brilinta or plans on repeat ultrasound.  She does admit to taking Brilinta once daily as she was experiencing excessive bruising on twice daily dosing.  No further concerns at this time.    History provided for reference purposes only Update 11/18/2019 JM: Ann Lopez returns for stroke follow-up. Residual deficits left sided weakness with gait impairment  Currently participating in outpatient therapy with ongoing improvement but has been having difficulties with left frozen shoulder.  Shoulder has been slowly improving.  Recently started on baclofen by PCP Participated in [facility] trial with excellent improvement of left arm  strength Ambulates without assistive device and denies any recent falls Denies new or worsening stroke/TIA symptoms Continues on aspirin and Crestor for secondary stroke prevention without side effects.  Blood pressure today 106/71.  Glucose levels stable.  Continues to follow with PCP for HTN, HLD and DM management. Follow-up carotid ultrasound 09/18/2019 showed patent right ICA stent and recommended 59-month follow-up per Dr. Estanislado Pandy.  She remains on Brilinta with mild bruising but no bleeding -she is unsure if this needs to be continued No further concerns  Initial visit 05/18/2019 Dr. Leonie Man: Ann Lopez is a 50 year old Caucasian lady seen for initial office follow-up visit following hospital admission for stroke in November 2020.  History is obtained from the patient, review of electronic medical records and I personally reviewed imaging films in PACS.  She has past medical history of diabetes, hyperlipidemia, smoking, breast cancer who presented on 03/02/2019 with sudden onset of left-sided weakness with neglect.  Last seen normal was unclear possibly the night prior and she was not a TPA candidate.  On exam she had left sided weakness with right gaze preference and was taken for emergent CT scan which was unremarkable but CT perfusion showed a relatively large right-sided penumbra with right M1 occlusion.  She was taken for emergent thrombectomy and was found to have proximal right carotid stenosis which required rescue ICA stenting and there was successful revascularization of the terminal right ICA, right ACA and MCA with TICI 2C reperfusion by Dr. Estanislado Pandy.  She was kept in the intensive care unit her blood pressure is tightly controlled and she was extubated and did well.  She had left hemiparesis but gradually improved.  She was placed on aspirin and Brilinta and transferred to inpatient rehab where she stayed for a few weeks and is currently at home.  She still getting therapy at home and is  about to finish occupational therapy.  She has graduated from walking with a walker and now walks with a cane which she uses mostly for outdoors and indoors she can walk without it.  She is have pain with improvement in the left leg but her left arm is still significantly weak and she has trouble with bending her fingers and fine motor skills.  She is tolerating aspirin and Brilinta with minor bruising but no major bleeding.  She has followed up with Dr. Estanislado Pandy who plans to do a follow-up carotid ultrasound in May at next visit.  She has quit smoking completely and is proud of this fact.  She states her sugars are doing much better and fasting sugars are usually in the 1 10-1 20 range last A1c was 6.  Her lipids are also improving and she is on Crestor and her primary care physician has recently prescribed Repatha injections which she has not yet started.  She has been evaluated for sleep apnea in the past and does not have it.  She wants to start driving and is requesting a handicap parking sticker.     ROS:   14 system review of systems is positive for those listed in HPI and all other systems negative   PMH:  Past Medical History:  Diagnosis Date  . Allergy   . Anxiety   . Asthma   . Back pain   . Breast cancer ([place])   . Cancer ([place])   . Depression   . Diabetes mellitus   . Headache   . Hyperlipemia   . Osteopenia   . Osteoporosis    osteopenia  . Stroke [facility])     Social History:  Social History   Socioeconomic History  . Marital status: Single    Spouse name: Not on file  . Number of children: 4  . Years of education: 49  . Highest education level: Not on file  Occupational History  . Occupation: Disabled  Tobacco Use  . Smoking status: Former Smoker    Packs/day: 0.50    Types: Cigarettes    Quit date: 03/02/2019    Years since quitting: 1.2  . Smokeless tobacco: Never Used  Substance and Sexual Activity  . Alcohol use: No    Alcohol/week: 0.0 standard drinks   . Drug use: No  . Sexual activity: Not Currently  Other Topics Concern  . Not on file  Social History Narrative   Marital status: single      Lives: at home with her four sons (12, 8, 15, 32); no grandchildren      Employment: disability for breast cancer, DDD lumbar, anxiety/panic attacks      Tobacco: 1 ppd       Alcohol: none         Right-handed.   2-3 cups caffeine daily.   Social Determinants of Health   Financial Resource Strain: Not on file  Food Insecurity: Not on file  Transportation Needs: Not on file  Physical Activity: Not on file  Stress: Not on file  Social Connections: Not on file  Intimate Partner Violence: Not on file    Medications:   Current Outpatient Medications on File Prior to Visit  Medication Sig Dispense Refill  . albuterol (  VENTOLIN HFA) 108 (90 Base) MCG/ACT inhaler Inhale into the lungs.    Marland Kitchen aspirin EC 81 MG tablet Take 1 tablet (81 mg total) by mouth daily. 30 tablet 0  . Budeson-Glycopyrrol-Formoterol (BREZTRI AEROSPHERE) 160-9-4.8 MCG/ACT AERO Inhale 2 puffs into the lungs in the morning and at bedtime.     . clonazePAM (KLONOPIN) 0.5 MG tablet Take 0.5 tablets (0.25 mg total) by mouth 2 (two) times daily as needed for anxiety. (Patient taking differently: Take 0.25 mg by mouth 2 (two) times daily as needed for anxiety. 1/2 tab(0.25mg ) at bedtime and 1/2 tab(0.25mg ) during the day if needed.) 30 tablet 0  . diclofenac Sodium (VOLTAREN) 1 % GEL Apply 2 g topically 4 (four) times daily. 100 g 3  . docusate sodium (COLACE) 100 MG capsule Take 200 mg by mouth daily as needed.     . DULoxetine (CYMBALTA) 60 MG capsule Take 1 capsule (60 mg total) by mouth daily. 90 capsule 0  . gabapentin (NEURONTIN) 300 MG capsule Take 300 mg by mouth at bedtime.    Marland Kitchen HUMALOG KWIKPEN 100 UNIT/ML KwikPen Inject 8 Units into the skin daily as needed (per sliding scale).     Marland Kitchen HYDROcodone-acetaminophen (NORCO) 10-325 MG tablet Take 1 tablet by mouth every 8 (eight)  hours.    Marland Kitchen icosapent Ethyl (VASCEPA) 1 g capsule Take 2 capsules (2 g total) by mouth 2 (two) times daily. 180 capsule 3  . Insulin Pen Needle (PEN NEEDLES) 32G X 5 MM MISC 1 application by Does not apply route at bedtime. 100 each 0  . ipratropium-albuterol (DUONEB) 0.5-2.5 (3) MG/3ML SOLN     . Lancets (ONETOUCH ULTRASOFT) lancets Use as instructed; check sugar before meals and at bedtime (Patient taking differently: 1 each by Other route as needed. Use as instructed; check sugar before meals and at bedtime. Patient uses test strips and lancets.) 100 each 12  . Levocetirizine Dihydrochloride (XYZAL PO) Take 1 tablet by mouth daily.     . methocarbamol (ROBAXIN) 500 MG tablet Take 500 mg by mouth every 8 (eight) hours as needed for muscle spasms.    . montelukast (SINGULAIR) 10 MG tablet Take 10 mg by mouth daily.     . Multiple Vitamins-Minerals (MULTIVITAMIN GUMMIES WOMENS PO) Take 2 Doses/Fill by mouth daily.     Marland Kitchen omeprazole (PRILOSEC) 20 MG capsule Take 20 mg by mouth daily.     Marland Kitchen OZEMPIC, 0.25 OR 0.5 MG/DOSE, 2 MG/1.5ML SOPN Inject into the skin.    . rosuvastatin (CRESTOR) 40 MG tablet Take by mouth.    . Semaglutide (OZEMPIC, 0.25 OR 0.5 MG/DOSE, [place]) Inject 0.25 mg into the skin once a week.    . ticagrelor (BRILINTA) 90 MG TABS tablet Take 1 tablet (90 mg total) by mouth 2 (two) times daily. 60 tablet 2  . Vitamin D, Ergocalciferol, (DRISDOL) 1.25 MG (50000 UNIT) CAPS capsule Take 50,000 Units by mouth 2 (two) times a week.    Marland Kitchen VRAYLAR capsule Take 3 mg by mouth daily.    . [DISCONTINUED] fluticasone (FLONASE) 50 MCG/ACT nasal spray Place 2 sprays into the nose daily. (Patient not taking: Reported on 03/10/2014) 1 g 12   No current facility-administered medications on file prior to visit.    Allergies:   Allergies  Allergen Reactions  . Azithromycin Other (See Comments)    Unknown reaction  . Morphine And Related Other (See Comments)    Unknown reaction - Can tolerate  hydrocodone and dilaudid without side effects  .  Nitrofurantoin Other (See Comments)    Unknown reaction  . Penicillins Hives and Itching    Has patient had a PCN reaction causing immediate rash, facial/tongue/throat swelling, SOB or lightheadedness with hypotension: Yes Has patient had a PCN reaction causing severe rash involving mucus membranes or skin necrosis: No Has patient had a PCN reaction that required hospitalization No Has patient had a PCN reaction occurring within the last 10 years: No If all of the above answers are "NO", then may proceed with Cephalosporin use.   . Sulfonamide Derivatives Hives      Today's Vitals   05/23/20 0957  BP: 114/79  Pulse: 67  Weight: 155 lb (70.3 kg)  Height: $Remove'5\' 2"'[id number]$  (1.575 m)   Body mass index is 28.35 kg/m.   Physical Exam General: well developed, well nourished pleasant middle-aged Caucasian lady, seated, in no evident distress Neck: supple with no carotid or supraclavicular bruits Cardiovascular: regular rate and rhythm, no murmurs Vascular:  Normal pulses all extremities  Neurologic Exam Mental Status: Awake and fully alert.  Fluent speech and language.  Oriented to place and time. Recent and remote memory intact. Attention span, concentration and fund of knowledge appropriate. Mood and affect appropriate.  Cranial Nerves: Pupils equal, briskly reactive to light. Extraocular movements full without nystagmus. Visual fields full to confrontation. Hearing intact. Facial sensation intact. Face, tongue, palate moves normally and symmetrically.  Motor: Normal strength and tone on the right.  Full strength left upper and lower extremity with decreased shoulder ROM 2/2 pain Sensory.: intact to touch ,pinprick .position and vibratory sensation.  Coordination: Rapid alternating movements normal in all extremities except slightly decreased left hand. Finger-to-nose performed accurately RUE (unable to adequately assess LUE d/t shoulder pain) and  heel-to-shin performed accurately BLE. Gait and Station: Arises from chair without difficulty. Stance is normal.  Gait demonstrates normal stride length and balance without use of assistive device.  Able to tandem walk and heel toe with mild difficulty. Reflexes: 1+ and symmetric. Toes downgoing.     ASSESSMENT/PLAN: 50 year old lady with right MCA infarct in November 2020 secondary to terminal right ICA, MCA and ACA occlusion status post mechanical thrombectomy and rescue right proximal ICA stent placement.  Vascular risk factors of diabetes, hyper lipidemia, smoking and carotid stenosis     1. Right MCA stroke:  a. Residual deficit: Unable to appreciate residual weakness of left side.  Left adhesive capsulitis slowly improving with decreased pain and increased ROM monitored by PMR Dr. Ranell Patrick  b. Continue aspirin 81 mg daily  and Crestor 40 mg daily for secondary stroke prevention.  c. Discussed secondary stroke prevention measures and importance of close PCP f/u for aggressive stroke risk factor management  2. R ICA s/p stent:  a. Remains on Brilinta per IR recommendations - pt reports only taking once daily (despite twice daily dosing recommendation) due to excessive bruising -advised to f/u with IR regarding ongoing need of Brilinta as she is currently 15 mo post stent placement b. Carotid ultrasound 04/18/2020 stable c. Continues to follow with Dr. Estanislado Pandy for monitoring and management 3. HLD:  a. LDL goal <70.  Reports recent lipid panel satisfactory (unable to personally view via epic).  On Crestor 40 mg daily per PCP 4. DMII:  a. A1c goal <7.0.  Reports recent A1c 6.6 (unable to personally view via epic) down from 8.4 6 mo ago.  Monitored/managed by PCP    Follow-up in 6 months or call earlier if needed    CC:  [facility]  provider: Dr. Oswaldo Done, Maebelle Munroe, MD    I spent 30 minutes of face-to-face and non-face-to-face time with patient.  This included previsit chart review,  lab review, study review, order entry, electronic health record documentation, patient education regarding history of stroke, residual deficits and improvement from prior visit, ongoing use of Brilinta post stent, importance of managing stroke risk factors and answered all other questions to patient satisfaction   Frann Rider, [facility]  [facility] [address] [place], [place] [address]  Phone [phone] Fax [phone] Note: This document was prepared with digital dictation and possible smart phrase technology. Any transcriptional errors that result from this process are unintentional.

## 2020-05-24 NOTE — Progress Notes (Signed)
I agree with the above plan 

## 2020-06-03 ENCOUNTER — Encounter: Payer: Medicare Other | Admitting: Physical Medicine and Rehabilitation

## 2020-07-11 ENCOUNTER — Encounter: Payer: Medicare Other | Admitting: Physical Medicine and Rehabilitation

## 2020-07-29 ENCOUNTER — Other Ambulatory Visit: Payer: Self-pay | Admitting: Physical Medicine and Rehabilitation

## 2020-08-18 ENCOUNTER — Encounter
Payer: Medicare Other | Attending: Physical Medicine and Rehabilitation | Admitting: Physical Medicine and Rehabilitation

## 2020-08-18 ENCOUNTER — Other Ambulatory Visit: Payer: Self-pay

## 2020-08-18 ENCOUNTER — Encounter: Payer: Self-pay | Admitting: Physical Medicine and Rehabilitation

## 2020-08-18 VITALS — BP 104/69 | HR 71 | Temp 98.2°F | Ht 62.0 in | Wt 150.0 lb

## 2020-08-18 DIAGNOSIS — Z853 Personal history of malignant neoplasm of breast: Secondary | ICD-10-CM | POA: Insufficient documentation

## 2020-08-18 DIAGNOSIS — M7502 Adhesive capsulitis of left shoulder: Secondary | ICD-10-CM | POA: Insufficient documentation

## 2020-08-18 MED ORDER — BACLOFEN 5 MG PO TABS: 5.0000 mg | ORAL_TABLET | Freq: Two times a day (BID) | ORAL | 3 refills | Status: DC | PRN

## 2020-08-18 NOTE — Patient Instructions (Signed)
Diabetes: 1) cinnamon- imitates effects of insulin, increasing glucose transport into cells ([place] or [place] cinnamon is best, least processed) 2) nuts- can slow down the blood sugar response of carbohydrate rich foods 3) oatmeal- contains and anti-inflammatory compound avenanthramide 4) whole-milk yogurt (best types are no sugar, Greek yogurt, or goat/sheep yogurt) 5) beans- high in protein, fiber, and vitamins, low glycemic index 6) broccoli- great source of vitamin A and C 7) quinoa- higher in protein and fiber than other grains 8) spinach- high in vitamin A, fiber, and protein 9) olive oil- reduces glucose levels, LDL, and triglycerides 10) salmon- excellent amount of omega-3-fatty acids 11) walnuts- rich in antioxidants 12) apples- high in fiber and quercetin 13) carrots- highly nutritious with low impact on blood sugar 14) eggs- improve HDL (good cholesterol), high in protein, keep you satiated 15) turmeric: improves blood sugars, cardiovascular disease, and protects kidney health 16) garlic: improves blood sugar, blood pressure, pain 17) tomatoes: highly nutritious with low impact on blood sugar 

## 2020-08-18 NOTE — Progress Notes (Signed)
Subjective:    Patient ID: Ann Lopez, female    DOB: [birth year], 50 y.o.   MRN: [id number]  HPI  Ann Lopez is a 50 year old woman who presents for f/u shoulder pain.   1) Left shoulder adhesive capsulitis: -SPRINT PNS removed without complications -She has noted benefit in pain in axillary distribution. -She continues to have pain in the distribution of the suprascapular nerve.  -She has had improved strength in her left shoulder. She loves her therapy, from which she gas graduated.  -She wants the full movement back in her arm back. Her strength is much improved since she participated in the [employer] [facility] trial to improve her mobility.  -She had injection and nerve block previously with excellent relief as well.  -pain is much improved, intermittent.   2) Insomnia: Improved.  -sleeps on and off.  -takes restoril sparingly.   3) Impaired mobility and ADLs: She has been able to walk more. She tries to walk as much as she can. She walks 45 minutes per day. She is trying to get rid of her limp  4) Depression: has been stable.  -she has been taking the cymbalta   5) Spasticity:  -she gets toe curling, it is worst in the morning.  -baclofen knocks her out.   6) Decreased emotion: -she now feels that things that used to upset her no longer do.    Pain Inventory Average Pain 4 Pain Right Now 0 My pain is no pain  In the last 24 hours, has pain interfered with the following? General activity 3 Relation with others 0 Enjoyment of life 2 What TIME of day is your pain at its worst? varies Sleep (in general) Fair  Pain is worse with: some activites Pain improves with: rest and medication Relief from Meds: 7  Family History  Problem Relation Age of Onset  . Cancer Mother 98       breast cancer  . Hypertension Sister   . Hypothyroidism Sister   . Cancer Sister   . Hypothyroidism Brother   . Cancer Maternal Grandmother 45       breast cancer  . Cancer Maternal  Grandfather 66       pancreatic cancer   Social History   Socioeconomic History  . Marital status: Single    Spouse name: Not on file  . Number of children: 4  . Years of education: 56  . Highest education level: Not on file  Occupational History  . Occupation: Disabled  Tobacco Use  . Smoking status: Former Smoker    Packs/day: 0.50    Types: Cigarettes    Quit date: 03/02/2019    Years since quitting: 1.4  . Smokeless tobacco: Never Used  Substance and Sexual Activity  . Alcohol use: No    Alcohol/week: 0.0 standard drinks  . Drug use: No  . Sexual activity: Not Currently  Other Topics Concern  . Not on file  Social History Narrative   Marital status: single      Lives: at home with her four sons (12, 22, 81, 41); no grandchildren      Employment: disability for breast cancer, DDD lumbar, anxiety/panic attacks      Tobacco: 1 ppd       Alcohol: none         Right-handed.   2-3 cups caffeine daily.   Social Determinants of Health   Financial Resource Strain: Not on file  Food Insecurity: Not on file  Transportation Needs: Not on file  Physical Activity: Not on file  Stress: Not on file  Social Connections: Not on file   Past Surgical History:  Procedure Laterality Date  . ABDOMINAL HYSTERECTOMY     B oophorectomy for BRCA1 gene  . BREAST SURGERY    . CESAREAN SECTION    . INCISION AND DRAINAGE ABSCESS Left 11/14/2012   Procedure: INCISION AND DRAINAGE ABSCESS LEFT GROIN;  Surgeon: Jamesetta So, MD;  Location: [facility];  Service: General;  Laterality: Left;  . INGUINAL LYMPH NODE BIOPSY Right 11/23/2019   Procedure: INGUINAL LYMPH NODE BIOPSY;  Surgeon: Aviva Signs, MD;  Location: [facility];  Service: General;  Laterality: Right;  . IR ANGIO VERTEBRAL SEL SUBCLAVIAN INNOMINATE UNI R MOD SED  03/02/2019  . IR CT HEAD LTD  03/02/2019  . IR INTRAVSC STENT CERV CAROTID W/O EMB-PROT MOD SED INC ANGIO  03/02/2019  . IR PERCUTANEOUS ART THROMBECTOMY/INFUSION  INTRACRANIAL INC DIAG ANGIO  03/02/2019  . LYMPHADENECTOMY    . MASTECTOMY Bilateral   . RADIOLOGY WITH ANESTHESIA N/A 03/02/2019   Procedure: IR WITH ANESTHESIA;  Surgeon: Luanne Bras, MD;  Location: [facility];  Service: Radiology;  Laterality: N/A;   Past Surgical History:  Procedure Laterality Date  . ABDOMINAL HYSTERECTOMY     B oophorectomy for BRCA1 gene  . BREAST SURGERY    . CESAREAN SECTION    . INCISION AND DRAINAGE ABSCESS Left 11/14/2012   Procedure: INCISION AND DRAINAGE ABSCESS LEFT GROIN;  Surgeon: Jamesetta So, MD;  Location: [facility];  Service: General;  Laterality: Left;  . INGUINAL LYMPH NODE BIOPSY Right 11/23/2019   Procedure: INGUINAL LYMPH NODE BIOPSY;  Surgeon: Aviva Signs, MD;  Location: [facility];  Service: General;  Laterality: Right;  . IR ANGIO VERTEBRAL SEL SUBCLAVIAN INNOMINATE UNI R MOD SED  03/02/2019  . IR CT HEAD LTD  03/02/2019  . IR INTRAVSC STENT CERV CAROTID W/O EMB-PROT MOD SED INC ANGIO  03/02/2019  . IR PERCUTANEOUS ART THROMBECTOMY/INFUSION INTRACRANIAL INC DIAG ANGIO  03/02/2019  . LYMPHADENECTOMY    . MASTECTOMY Bilateral   . RADIOLOGY WITH ANESTHESIA N/A 03/02/2019   Procedure: IR WITH ANESTHESIA;  Surgeon: Luanne Bras, MD;  Location: [facility];  Service: Radiology;  Laterality: N/A;   Past Medical History:  Diagnosis Date  . Allergy   . Anxiety   . Asthma   . Back pain   . Breast cancer ([place])   . Cancer ([place])   . Depression   . Diabetes mellitus   . Headache   . Hyperlipemia   . Osteopenia   . Osteoporosis    osteopenia  . Stroke ([facility])    BP 104/69   Pulse 71   Temp 98.2 F (36.8 C)   Ht 5' 2"  (1.575 m)   Wt 150 lb (68 kg)   SpO2 92%   BMI 27.44 kg/m   Opioid Risk Score:   Fall Risk Score:  `1  Depression screen PHQ 2/9  Depression screen [facility] 2/9 01/04/2016 12/06/2015 10/27/2015 09/29/2015 08/30/2015 07/28/2015 06/28/2015  Decreased Interest 0 0 0 0 0 0 0  Down, Depressed, Hopeless 0 0 1 0 0 0 0  PHQ - 2 Score 0 0 1  0 0 0 0  Altered sleeping - - - - - - -  Tired, decreased energy - - - - - - -  Change in appetite - - - - - - -  Feeling bad or failure about yourself  - - - - - - -  Trouble concentrating - - - - - - -  Moving slowly or fidgety/restless - - - - - - -  Suicidal thoughts - - - - - - -  PHQ-9 Score - - - - - - -  Difficult doing work/chores - - - - - - -  Some recent data might be hidden    Review of Systems  Constitutional: Negative.   HENT: Negative.   Eyes: Positive for visual disturbance.  Respiratory: Negative.   Cardiovascular: Negative.   Gastrointestinal: Negative.   Endocrine: Negative.   Genitourinary: Negative.   Musculoskeletal: Positive for arthralgias.  Skin: Negative.   Allergic/Immunologic: Negative.   Neurological: Negative.   Hematological: Negative.   Psychiatric/Behavioral: Negative.   All other systems reviewed and are negative.      Objective:   Physical Exam Gen: no distress, normal appearing HEENT: oral mucosa pink and moist, NCAT Cardio: Reg rate Chest: normal effort, normal rate of breathing Abd: soft, non-distended Ext: no edema Psych: pleasant, normal affect Skin: intact Neuro: Alert and oriented x3. Musculoskeletal: left shoulder with 4/5 SAB and severely limited external rotation. Otherwise with 5/5 strength throughout which is great improvement from last visit! Tight cervical musculature- no significant trigger points.  Psych: pleasant, normal affect, much more positive than last visit!     Assessment & Plan:  Ann Lopez is a 50 year old woman who presents for f/u of shoulder pain post-stroke.   1) Adhesive capsulitis of left shoulder: -He strength is much improved since last visit and she does not exhibit significant LUE spasticity. I think her pain is most likely due to frozen shoulder, especially given her severely limited external rotation.  -Continue HEP -Can repeat steroid injection and/or nerve block in 1 month. Discussed risks  of repeat steroid injections. -SPRINT PNS has provided improvement in pain in axillary distribution. Device removed successfully today. Discussed that we can consider suprascapular nerve in the future if pain becomes severe.  -Continue PRN meloxicam, lidocaine patch, and diclofenac gel for pain relief in interim.   2) Impaired mobility and ADLs -Continue daily 45 minute walk -Advised to lift weights to maintain muscle mass and good metabolism.   3) Insomnia: -Recommended walk outside every morning to help reset circadian rhythm -Commended on weaning off Temazepam  4) Depression: -Advised that exercise will help with her depression as well.   5) Spasticity:  -start baclofen 59m at night. Dicussed potential side effects.   6) Breast cancer: -13 years in remission -discussed benefits of ketogenic diet.   7) Prediabetes: -try to incorporate into your diet some of the following foods which are good for diabetes: 1) cinnamon- imitates effects of insulin, increasing glucose transport into cells ([place] V[place]innamon is best, least processed) 2) nuts- can slow down the blood sugar response of carbohydrate rich foods 3) oatmeal- contains and anti-inflammatory compound avenanthramide 4) whole-milk yogurt (best types are no sugar, G[place]urt, or goat/sheep yogurt) 5) beans- high in protein, fiber, and vitamins, low glycemic index 6) broccoli- great source of vitamin A and C 7) quinoa- higher in protein and fiber than other grains 8) spinach- high in vitamin A, fiber, and protein 9) olive oil- reduces glucose levels, LDL, and triglycerides 10) salmon- excellent amount of omega-3-fatty acids 11) walnuts- rich in antioxidants 12) apples- high in fiber and quercetin 13) carrots- highly nutritious with low impact on blood sugar 14) eggs- improve HDL (good cholesterol), high in protein, keep you satiated 15) turmeric: improves blood sugars, cardiovascular  disease, and protects kidney  health 16) garlic: improves blood sugar, blood pressure, pain 17) tomatoes: highly nutritious with low impact on blood sugar

## 2020-11-17 ENCOUNTER — Ambulatory Visit: Payer: Medicare Other | Admitting: Physical Medicine and Rehabilitation

## 2020-11-22 ENCOUNTER — Ambulatory Visit (INDEPENDENT_AMBULATORY_CARE_PROVIDER_SITE_OTHER): Payer: Medicare Other | Admitting: Adult Health

## 2020-11-22 ENCOUNTER — Telehealth: Payer: Self-pay | Admitting: Physical Medicine and Rehabilitation

## 2020-11-22 ENCOUNTER — Other Ambulatory Visit: Payer: Self-pay | Admitting: Physical Medicine and Rehabilitation

## 2020-11-22 ENCOUNTER — Other Ambulatory Visit (HOSPITAL_COMMUNITY): Payer: Self-pay | Admitting: Interventional Radiology

## 2020-11-22 ENCOUNTER — Other Ambulatory Visit: Payer: Self-pay

## 2020-11-22 ENCOUNTER — Encounter: Payer: Self-pay | Admitting: Adult Health

## 2020-11-22 VITALS — BP 97/63 | HR 68 | Ht 62.0 in | Wt 145.0 lb

## 2020-11-22 DIAGNOSIS — E1169 Type 2 diabetes mellitus with other specified complication: Secondary | ICD-10-CM

## 2020-11-22 DIAGNOSIS — Z8673 Personal history of transient ischemic attack (TIA), and cerebral infarction without residual deficits: Secondary | ICD-10-CM

## 2020-11-22 DIAGNOSIS — I6521 Occlusion and stenosis of right carotid artery: Secondary | ICD-10-CM

## 2020-11-22 DIAGNOSIS — F341 Dysthymic disorder: Secondary | ICD-10-CM

## 2020-11-22 DIAGNOSIS — E785 Hyperlipidemia, unspecified: Secondary | ICD-10-CM | POA: Diagnosis not present

## 2020-11-22 DIAGNOSIS — E669 Obesity, unspecified: Secondary | ICD-10-CM

## 2020-11-22 DIAGNOSIS — I771 Stricture of artery: Secondary | ICD-10-CM

## 2020-11-22 DIAGNOSIS — R4589 Other symptoms and signs involving emotional state: Secondary | ICD-10-CM

## 2020-11-22 NOTE — Progress Notes (Signed)
[facility] [address] [place]. [place] [address] [phone]       OFFICE FOLLOW-UP NOTE  [name] Date of Birth:  08-Dec-1970 Medical Record Number:  [id number]    Reason for visit: Stroke   Chief complaint: Chief Complaint  Patient presents with   Stroke    Rm 2, 6 month FU "want to discuss not having any emotions, feeling numb"       HPI:   Today, 11/22/2020, Ms. Tamplin returns for 49-monthstroke follow-up accompanied by her granddaughter.  Overall stable.  Residual limited movement of left shoulder.  Routinely follows with Dr. RRanell PatrickPMR for left shoulder adhesive capsulitis. Pain has improved since injection and SPRINT PNS and reports improved ROM but still some limitation especially when reaching behind her.  Tries to use left arm as much as she can but will find her self using her right arm more which has been causing some increased pain in right shoulder. Has f/u with Dr. RArchie Patten8/25.  She also struggles with "lack of emotion" which has been present since her stroke.  She has since self discontinued Cymbalta with concern this was contributing to symptoms but no improvement.  PCP recently referred her to neuropsych (unsure who or location as unable to view via epic) but doesn't have appt until October.  She does endorse longstanding history of depression and saw psychology before without benefit. She feels like these symptoms are different from her normal depression.  She is extremely frustrated as she feels like she just needs to cry but can't. Reports a good friend passed in May and even though she felt sadness, she was not able to cry. Feels like she is "numb" to certain situations.  Otherwise, no other complaints and denies new stroke/TIA symptoms.  Compliant on aspirin and Crestor as well as Brilinta s/p stent -denies associated side effects.  Blood pressure today 97/63.  No further concerns at this time      History provided for reference  purposes only Update 05/23/2020 JM: Ms. OStalzerreturns for stroke follow up unaccompanied. Stable since prior visit without new stroke/TIA symptoms and reports residual left frozen shoulder but otherwise excellent improvement of left-sided weakness and gait.  She ambulates now without assistive device and denies any recent falls.  Completed OT as she met stated rehab goals with residual pain and reduced left shoulder ROM.  Followed by PMR Dr. RRanell Patrickfor left shoulder adhesive capsulitis with plans on repeating steroid injection and/or nerve block at follow-up visit.  Remains on aspirin and Crestor for secondary stroke prevention of side effects.  Blood pressure today 114/79.  Glucose levels have greatly improved with reporting recent A1c 6.6 (unable to personally view via epic).  Remains on Brilinta per IR recommendations s/p stent with recent carotid duplex showing stable appearance -she is not sure of ongoing need of Brilinta or plans on repeat ultrasound.  She does admit to taking Brilinta once daily as she was experiencing excessive bruising on twice daily dosing.  No further concerns at this time.  Update 11/18/2019 JM: Ms. OHaggardreturns for stroke follow-up. Residual deficits left sided weakness with gait impairment  Currently participating in outpatient therapy with ongoing improvement but has been having difficulties with left frozen shoulder.  Shoulder has been slowly improving.  Recently started on baclofen by PCP [facility] of left arm strength Ambulates without assistive device and denies any recent falls Denies new or worsening stroke/TIA symptoms  Continues on aspirin and Crestor for secondary stroke prevention without side effects.  Blood pressure today 106/71.  Glucose levels stable.  Continues to follow with PCP for HTN, HLD and DM management. Follow-up carotid ultrasound 09/18/2019 showed patent right ICA stent and recommended 39-month follow-up per Dr. DEstanislado Pandy  She remains on Brilinta with mild bruising but no bleeding -she is unsure if this needs to be continued No further concerns  Initial visit 05/18/2019 Dr. SLeonie Man Ms. Teyla 075en is a [age]year old Caucasian lady seen for initial office follow-up visit following hospital admission for stroke in November 2020.  History is obtained from the patient, review of electronic medical records and I personally reviewed imaging films in PACS.  She has past medical history of diabetes, hyperlipidemia, smoking, breast cancer who presented on 03/02/2019 with sudden onset of left-sided weakness with neglect.  Last seen normal was unclear possibly the night prior and she was not a TPA candidate.  On exam she had left sided weakness with right gaze preference and was taken for emergent CT scan which was unremarkable but CT perfusion showed a relatively large right-sided penumbra with right M1 occlusion.  She was taken for emergent thrombectomy and was found to have proximal right carotid stenosis which required rescue ICA stenting and there was successful revascularization of the terminal right ICA, right ACA and MCA with TICI 2C reperfusion by Dr. DEstanislado Pandy  She was kept in the intensive care unit her blood pressure is tightly controlled and she was extubated and did well.  She had left hemiparesis but gradually improved.  She was placed on aspirin and Brilinta and transferred to inpatient rehab where she stayed for a few weeks and is currently at home.  She still getting therapy at home and is about to finish occupational therapy.  She has graduated from walking with a walker and now walks with a cane which she uses mostly for outdoors and indoors she can walk without it.  She is have pain with improvement in the left leg but her left arm is still significantly weak and she has trouble with bending her fingers and fine motor skills.  She is tolerating aspirin and Brilinta with minor bruising but no  major bleeding.  She has followed up with Dr. DEstanislado Pandywho plans to do a follow-up carotid ultrasound in May at next visit.  She has quit smoking completely and is proud of this fact.  She states her sugars are doing much better and fasting sugars are usually in the 1 10-1 20 range last A1c was 6.  Her lipids are also improving and she is on Crestor and her primary care physician has recently prescribed Repatha injections which she has not yet started.  She has been evaluated for sleep apnea in the past and does not have it.  She wants to start driving and is requesting a handicap parking sticker.     ROS:   14 system review of systems is positive for those listed in HPI and all other systems negative   PMH:  Past Medical History:  Diagnosis Date   Allergy    Anxiety    Asthma    Back pain    Breast cancer ([facility]    Depression    Diabetes mellitus    Headache    Hyperlipemia    Osteopenia    Osteoporosis    osteopenia   Stroke ([facility]     Social History:  Social History  Socioeconomic History   Marital status: Single    Spouse name: Not on file   Number of children: 4   Years of education: 26   Highest education level: Not on file  Occupational History   Occupation: Disabled  Tobacco Use   Smoking status: Former    Packs/day: 0.50    Types: Cigarettes    Quit date: 03/02/2019    Years since quitting: 1.7   Smokeless tobacco: Never  Substance and Sexual Activity   Alcohol use: No    Alcohol/week: 0.0 standard drinks   Drug use: No   Sexual activity: Not Currently  Other Topics Concern   Not on file  Social History Narrative   Marital status: single      Lives: at home with her four sons (67, 32, 27, 62)      Employment: disability for breast cancer, DDD lumbar, anxiety/panic attacks      Tobacco: 1 ppd       Alcohol: none         Right-handed.   2-3 cups caffeine daily.   Social Determinants of Health   Financial Resource Strain: Not on  file  Food Insecurity: Not on file  Transportation Needs: Not on file  Physical Activity: Not on file  Stress: Not on file  Social Connections: Not on file  Intimate Partner Violence: Not on file    Medications:   Current Outpatient Medications on File Prior to Visit  Medication Sig Dispense Refill   albuterol (VENTOLIN HFA) 108 (90 Base) MCG/ACT inhaler Inhale into the lungs.     aspirin EC 81 MG tablet Take 1 tablet (81 mg total) by mouth daily. 30 tablet 0   Baclofen 5 MG TABS Take 5 mg by mouth 2 (two) times daily as needed. 60 tablet 3   Budeson-Glycopyrrol-Formoterol (BREZTRI AEROSPHERE) 160-9-4.8 MCG/ACT AERO Inhale 2 puffs into the lungs in the morning and at bedtime.      clonazePAM (KLONOPIN) 0.5 MG tablet Take 0.5 tablets (0.25 mg total) by mouth 2 (two) times daily as needed for anxiety. (Patient taking differently: Take 0.25 mg by mouth 2 (two) times daily as needed for anxiety. 1/2 tab(0.74m) at bedtime and 1/2 tab(0.240m during the day if needed.) 30 tablet 0   docusate sodium (COLACE) 100 MG capsule Take 200 mg by mouth daily as needed.      ergocalciferol (VITAMIN D2) 1.25 MG (50000 UT) capsule Take 1 capsule by mouth once a week.     HYDROcodone-acetaminophen (NORCO) 10-325 MG tablet Take 1 tablet by mouth every 8 (eight) hours.     icosapent Ethyl (VASCEPA) 1 g capsule Take 2 capsules (2 g total) by mouth 2 (two) times daily. 180 capsule 3   ipratropium-albuterol (DUONEB) 0.5-2.5 (3) MG/3ML SOLN      Levocetirizine Dihydrochloride (XYZAL PO) Take 1 tablet by mouth daily.      montelukast (SINGULAIR) 10 MG tablet Take 10 mg by mouth daily.      omeprazole (PRILOSEC) 20 MG capsule Take 20 mg by mouth daily.      OZEMPIC, 0.25 OR 0.5 MG/DOSE, 2 MG/1.5ML SOPN Inject into the skin.     rosuvastatin (CRESTOR) 40 MG tablet Take by mouth.     temazepam (RESTORIL) 15 MG capsule Take 15 mg by mouth at bedtime.     ticagrelor (BRILINTA) 90 MG TABS tablet Take 1 tablet (90 mg  total) by mouth 2 (two) times daily. 60 tablet 2   DULoxetine (CYMBALTA) 60 MG capsule  TAKE 1 CAPSULE BY MOUTH EVERY DAY (Patient not taking: Reported on 11/22/2020) 90 capsule 0   gabapentin (NEURONTIN) 300 MG capsule Take 300 mg by mouth at bedtime. (Patient not taking: Reported on 11/22/2020)     HUMALOG KWIKPEN 100 UNIT/ML KwikPen Inject 8 Units into the skin daily as needed (per sliding scale).  (Patient not taking: Reported on 11/22/2020)     Insulin Pen Needle (PEN NEEDLES) 32G X 5 MM MISC 1 application by Does not apply route at bedtime. (Patient not taking: Reported on 11/22/2020) 100 each 0   Lancets (ONETOUCH ULTRASOFT) lancets Use as instructed; check sugar before meals and at bedtime (Patient not taking: Reported on 11/22/2020) 100 each 12   methocarbamol (ROBAXIN) 500 MG tablet Take 500 mg by mouth every 8 (eight) hours as needed for muscle spasms. (Patient not taking: Reported on 11/22/2020)     VRAYLAR capsule Take 3 mg by mouth daily. (Patient not taking: Reported on 11/22/2020)     [DISCONTINUED] fluticasone (FLONASE) 50 MCG/ACT nasal spray Place 2 sprays into the nose daily. (Patient not taking: Reported on 03/10/2014) 1 g 12   No current facility-administered medications on file prior to visit.    Allergies:   Allergies  Allergen Reactions   Azithromycin Other (See Comments)    Unknown reaction   Morphine And Related Other (See Comments)    Unknown reaction - Can tolerate hydrocodone and dilaudid without side effects   Nitrofurantoin Other (See Comments)    Unknown reaction   Penicillins Hives and Itching    Has patient had a PCN reaction causing immediate rash, facial/tongue/throat swelling, SOB or lightheadedness with hypotension: Yes Has patient had a PCN reaction causing severe rash involving mucus membranes or skin necrosis: No Has patient had a PCN reaction that required hospitalization No Has patient had a PCN reaction occurring within the last 10 years: No If all of the  above answers are "NO", then may proceed with Cephalosporin use.    Sulfonamide Derivatives Hives      Today's Vitals   11/22/20 0839  BP: 97/63  Pulse: 68  Weight: 145 lb (65.8 kg)  Height: $Remove'5\' 2"'mwfGSyc$  (1.575 m)    Body mass index is 26.52 kg/m.   Physical Exam General: well developed, well nourished pleasant middle-aged Caucasian lady, seated, in no evident distress Neck: supple with no carotid or supraclavicular bruits Cardiovascular: regular rate and rhythm, no murmurs Vascular:  Normal pulses all extremities  Neurologic Exam Mental Status: Awake and fully alert.  Fluent speech and language.  Oriented to place and time. Recent and remote memory intact. Attention span, concentration and fund of knowledge appropriate. Mood and affect appropriate.  Cranial Nerves: Pupils equal, briskly reactive to light. Extraocular movements full without nystagmus. Visual fields full to confrontation. Hearing intact. Facial sensation intact. Face, tongue, palate moves normally and symmetrically.  Motor: Normal strength and tone on the right.  Full strength left upper and lower extremity with slightly decreased ROM due to stiffness/pain although improved since prior visit Sensory.: intact to touch ,pinprick .position and vibratory sensation.  Coordination: Rapid alternating movements normal in all extremities except slightly decreased left hand. Finger-to-nose performed accurately RUE and heel-to-shin performed accurately BLE. Gait and Station: Arises from chair without difficulty. Stance is normal.  Gait demonstrates normal stride length and balance without use of assistive device.  Able to tandem walk and heel toe with mild difficulty. Reflexes: 1+ and symmetric. Toes downgoing.     ASSESSMENT/PLAN: 50 year old lady with right  MCA infarct in November 2020 secondary to terminal right ICA, MCA and ACA occlusion status post mechanical thrombectomy and rescue right proximal ICA stent placement.  Vascular  risk factors of diabetes, hyperlipidemia, smoking and carotid stenosis     Right MCA stroke:  Residual deficit: left shoulder adhesive capsulitis and changes in emotion. Possibly post stroke with underlying depression. Agree with evaluation by neuropsych - referral completed by PCP - initial eval October (unable to view via epic). Discussed potential benefit of medications such as SSRI's but patient declines.  Frustrated with agreement for neuropsych as prior counseling not beneficial.  Discussed post stroke mood/emotional changes and use of counseling which can be very beneficial and limited further options. Discussed stress relaxation techniques at home to help with extreme feelings of frustrations during the interval time.  She was advised to continue to follow with PCP for depression/anxiety Continue aspirin 81 mg daily  and Crestor 40 mg daily for secondary stroke prevention.  Discussed secondary stroke prevention measures and importance of close PCP f/u for aggressive stroke risk factor management  R ICA s/p stent:  Remains on Brilinta per IR recommendations - has not yet had f/u imaging with prior CUS 04/2020 Will reach out to IR to assist with scheduling patient for f/u as well as discuss ongoing duration of Brilinta HLD:  LDL goal <70.  Reports recent lipid panel satisfactory (unable to personally view via epic).  On Crestor 40 mg daily per PCP DMII:  A1c goal <7.0.  Reports recent A1c 5.6 down from 6.6 (unable to personally view via epic).  Monitored/managed by PCP    Follow-up in 6 months or call earlier if needed    CC:  [facility] provider: Dr. Oswaldo Done, Maebelle Munroe, MD    I spent 36 minutes of face-to-face and non-face-to-face time with patient.  This included previsit chart review, lab review, study review, electronic health record documentation, patient education regarding history of stroke, residual deficits including prolonged discussion regarding emotional concerns, secondary  stroke prevention measures and aggressive stroke risk factor management, ongoing use of Brilinta post stent and need for f/u with IR and answered all other questions to patient satisfaction   Frann Rider, [facility]  [facility] [address] [place], [place] [address]  Phone [phone] Fax [phone] Note: This document was prepared with digital dictation and possible smart phrase technology. Any transcriptional errors that result from this process are unintentional.

## 2020-11-22 NOTE — Patient Instructions (Addendum)
Continue aspirin 81 mg daily  and Crestor  for secondary stroke prevention  Continue to follow up with PCP regarding cholesterol, diabetes and blood pressure management  Maintain strict control of hypertension with blood pressure goal below 130/90, diabetes with hemoglobin A1c goal below 7% and cholesterol with LDL cholesterol (bad cholesterol) goal below 70 mg/dL.   Ensure follow up with neuropsychology for emotional changes after your stroke - sometimes medications can be beneficial but I believe your best way to feel better would be to try counseling. As well as relaxation techniques provided below     Followup in the future with me in 6 months or call earlier if needed       Thank you for coming to see [place] at [facility]. I hope we have been able to provide you high quality care today.  You may receive a patient satisfaction survey over the next few weeks. We would appreciate your feedback and comments so that we may continue to improve ourselves and the health of our patients.    Emotional Health After Stroke Your emotional health may change after a stroke. You may have fear, anxiety, and other feelings. Some of these changes happen because a stroke can damage your brain and nervous system. You may also have these feelings because copingwith a change in your health can feel overwhelming. Depression and other emotional changes can slow your recovery after a stroke. It is important to recognize the symptoms so that you can take steps tostrengthen your emotional health. What are some common emotions after a stroke? You may feel: Fear. Anxiety. Anger. Frustration. Sadness. Loss or grief. You may cry or laugh at the wrong time or in the wrong situation (pseudobulbar affect, orPBA). How to recognize symptoms of depression Depression is common after a stroke. It can happen right away, or it can show up later. Symptoms of depression may include: Physical changes,  such as: Sleep problems. Weight gain or weight loss. Not having energy or enthusiasm (lethargy). Feeling very tired (fatigue). Emotional changes, such as: Irritability. Crying more than usual. Mood swings. Feeling hopeless. Hating yourself. Having suicidal thoughts. Behavioral changes such as: Avoiding people and activities (social withdrawal). Not being able to concentrate. Changes in eating habits, such as eating too much or too little. What increases my risk of depression? Having a stroke raises the risk of depression. The risk also goes up if you: Are socially isolated. Have a family history of depression. Have a personal history of depression or other mental health problems before the stroke. Are unable to work or do activities that you previously enjoyed. Feel dependent on others for daily activities. Use drugs or alcohol. What are some coping methods I can use? Ask your health care provider for help. He or she may recommend treatments for depression, such as: Counseling with a mental health professional to help with depression and stroke recovery. Medical therapies, such as brain stimulation or light therapy. Lifestyle changes, such as eating a healthy diet and avoiding alcohol. Antidepressant medicines. Alternative therapies, such as meditation, acupuncture, music therapy, or pet therapy. Other coping strategies may include: Physical therapy or exercises every day to help you with movement. Writing your thoughts in a journal. An example might be keeping track of negative thoughts or keeping a log of things you feel grateful for. Practicing good sleep habits, such as going to bed and getting up at the same time every day. Following a routine each day. Participating in activities that make you  laugh. Mindfulness therapy. This may include meditation and other techniques to lower your stress. Joining a support group for people who are recovering from a stroke. These groups  provide social interaction and help you feel connected to others. Your health care team can help you find a support group in your area. Where to find more information American Stroke Association: www.stroke.org Contact a health care provider if: You have emotions and feelings that deepen and are cause for concern. Get help right away if: You feel hopeless. You have thoughts of hurting yourself or someone else. You feel suicidal. If you ever feel like you may hurt yourself or others, or have thoughts about taking your own life, get help right away. Go to your nearest emergency department or: Call your local emergency services (911 in the [place] Call a suicide crisis helpline, such as the [place] at 423-618-3806. This is open 24 hours a day. Text the Crisis Text Line at 609-131-3147 (in the [place].). Summary Your emotional health may change after a stroke. You may have an increase in fear, anxiety, anger, sadness or other feelings. It is important to recognize the symptoms of depression and other emotional problems and to seek help from professionals. Lifestyle changes, counseling, physical therapy, and exercising can help you cope with the effects of stroke and help you stay as emotionally healthy as possible. This information is not intended to replace advice given to you by your health care provider. Make sure you discuss any questions you have with your healthcare provider. Document Revised: 03/12/2020 Document Reviewed: 03/12/2020 Elsevier Patient Education  2022 [employer].

## 2020-11-22 NOTE — Telephone Encounter (Signed)
Patient would like to see Dr Sima Matas, she remembers seeing him at the hospital. Having some issues with emotions since the stroke feels like she would be better if she could sit down and cry but she can't and leads to her feeling like she is going to explode.  Did advise once referral is placed we can schedule a visit with him.

## 2020-11-29 NOTE — Progress Notes (Signed)
I agree with the above plan 

## 2020-12-01 ENCOUNTER — Encounter: Payer: Self-pay | Admitting: Physical Medicine and Rehabilitation

## 2020-12-01 ENCOUNTER — Encounter
Payer: Medicare Other | Attending: Physical Medicine and Rehabilitation | Admitting: Physical Medicine and Rehabilitation

## 2020-12-01 ENCOUNTER — Other Ambulatory Visit: Payer: Self-pay

## 2020-12-01 VITALS — BP 104/72 | HR 78 | Temp 97.8°F | Ht 62.0 in | Wt 143.6 lb

## 2020-12-01 DIAGNOSIS — F341 Dysthymic disorder: Secondary | ICD-10-CM | POA: Insufficient documentation

## 2020-12-01 DIAGNOSIS — F482 Pseudobulbar affect: Secondary | ICD-10-CM | POA: Diagnosis present

## 2020-12-01 DIAGNOSIS — R531 Weakness: Secondary | ICD-10-CM | POA: Diagnosis present

## 2020-12-01 MED ORDER — NUEDEXTA 20-10 MG PO CAPS: 1.0000 | ORAL_CAPSULE | Freq: Every day | ORAL | 3 refills | Status: AC

## 2020-12-01 NOTE — Progress Notes (Addendum)
Subjective:    Patient ID: Ann Lopez, female    DOB: 08/26/1970, 50 y.o.   MRN: [id number]  HPI  Mrs Cavalieri is a 50 year old woman who presents for f/u shoulder pain.   1) Left shoulder adhesive capsulitis: -SPRINT PNS removed without complications -She has noted benefit in pain in axillary distribution. -She continues to have pain in the distribution of the suprascapular nerve.  -She has had improved strength in her left shoulder. She loves her therapy, from which she gas graduated.  -She wants the full movement back in her arm back. Her strength is much improved since she participated in the [employer] trial to improve her mobility.  -She had injection and nerve block previously with excellent relief as well.  -pain is much improved, intermittent.  -shoulder pain has greatly improved- wants to do OT to work on her range of motion.   2) Insomnia: Improved.  -sleeps on and off.  -takes restoril sparingly.   3) Impaired mobility and ADLs: She has been able to walk more. She tries to walk as much as she can. She walks 45 minutes per day. She is trying to get rid of her limp  4) Depression: has been stable.  -she has been taking the cymbalta- stopped this -would like to see neuropsych  5) Spasticity:  -she gets toe curling, it is worst in the morning.  -baclofen knocks her out.   6) Decreased emotion: -she now feels that things that used to upset her no longer do.  -she no long is able to cry.  -she has been laughing in serious situations and this really bothers her -she has always been a very sympathetic person   Pain Inventory Average Pain 0 Pain Right Now 0 My pain is  no pain  In the last 24 hours, has pain interfered with the following? General activity 0 Relation with others 0 Enjoyment of life 0 What TIME of day is your pain at its worst? No pain Sleep (in general) Fair  Pain is worse with: some activites Pain improves with: rest and  medication Relief from Meds: 7  Family History  Problem Relation Age of Onset   Cancer Mother 64       breast cancer   Hypertension Sister    Hypothyroidism Sister    Cancer Sister    Hypothyroidism Brother    Cancer Maternal Grandmother 1       breast cancer   Cancer Maternal Grandfather 49       pancreatic cancer   Social History   Socioeconomic History   Marital status: Single    Spouse name: Not on file   Number of children: 4   Years of education: 13   Highest education level: Not on file  Occupational History   Occupation: Disabled  Tobacco Use   Smoking status: Former    Packs/day: 0.50    Types: Cigarettes    Quit date: 03/02/2019    Years since quitting: 1.7   Smokeless tobacco: Never  Substance and Sexual Activity   Alcohol use: No    Alcohol/week: 0.0 standard drinks   Drug use: No   Sexual activity: Not Currently  Other Topics Concern   Not on file  Social History Narrative   Marital status: single      Lives: at home with her four sons (12, 29, 26, 33)      Employment: disability for breast cancer, DDD lumbar, anxiety/panic attacks  Tobacco: 1 ppd       Alcohol: none         Right-handed.   2-3 cups caffeine daily.   Social Determinants of Health   Financial Resource Strain: Not on file  Food Insecurity: Not on file  Transportation Needs: Not on file  Physical Activity: Not on file  Stress: Not on file  Social Connections: Not on file   Past Surgical History:  Procedure Laterality Date   ABDOMINAL HYSTERECTOMY     B oophorectomy for BRCA1 gene   BREAST SURGERY     CESAREAN SECTION     INCISION AND DRAINAGE ABSCESS Left 11/14/2012   Procedure: INCISION AND DRAINAGE ABSCESS LEFT GROIN;  Surgeon: Jamesetta So, MD;  Location: [facility];  Service: General;  Laterality: Left;   INGUINAL LYMPH NODE BIOPSY Right 11/23/2019   Procedure: INGUINAL LYMPH NODE BIOPSY;  Surgeon: Aviva Signs, MD;  Location: [facility];  Service: General;  Laterality:  Right;   IR ANGIO VERTEBRAL SEL SUBCLAVIAN INNOMINATE UNI R MOD SED  03/02/2019   IR CT HEAD LTD  03/02/2019   IR INTRAVSC STENT CERV CAROTID W/O EMB-PROT MOD SED INC ANGIO  03/02/2019   IR PERCUTANEOUS ART THROMBECTOMY/INFUSION INTRACRANIAL INC DIAG ANGIO  03/02/2019   LYMPHADENECTOMY     MASTECTOMY Bilateral    RADIOLOGY WITH ANESTHESIA N/A 03/02/2019   Procedure: IR WITH ANESTHESIA;  Surgeon: Luanne Bras, MD;  Location: [facility];  Service: Radiology;  Laterality: N/A;   Past Surgical History:  Procedure Laterality Date   ABDOMINAL HYSTERECTOMY     B oophorectomy for BRCA1 gene   BREAST SURGERY     CESAREAN SECTION     INCISION AND DRAINAGE ABSCESS Left 11/14/2012   Procedure: INCISION AND DRAINAGE ABSCESS LEFT GROIN;  Surgeon: Jamesetta So, MD;  Location: [facility];  Service: General;  Laterality: Left;   INGUINAL LYMPH NODE BIOPSY Right 11/23/2019   Procedure: INGUINAL LYMPH NODE BIOPSY;  Surgeon: Aviva Signs, MD;  Location: [facility];  Service: General;  Laterality: Right;   IR ANGIO VERTEBRAL SEL SUBCLAVIAN INNOMINATE UNI R MOD SED  03/02/2019   IR CT HEAD LTD  03/02/2019   IR INTRAVSC STENT CERV CAROTID W/O EMB-PROT MOD SED INC ANGIO  03/02/2019   IR PERCUTANEOUS ART THROMBECTOMY/INFUSION INTRACRANIAL INC DIAG ANGIO  03/02/2019   LYMPHADENECTOMY     MASTECTOMY Bilateral    RADIOLOGY WITH ANESTHESIA N/A 03/02/2019   Procedure: IR WITH ANESTHESIA;  Surgeon: Luanne Bras, MD;  Location: [facility];  Service: Radiology;  Laterality: N/A;   Past Medical History:  Diagnosis Date   Allergy    Anxiety    Asthma    Back pain    Breast cancer (HCC)    Cancer ([place])    Depression    Diabetes mellitus    Headache    Hyperlipemia    Osteopenia    Osteoporosis    osteopenia   Stroke (HCC)    BP 104/72   Pulse 78   Temp 97.8 F (36.6 C)   Ht $R'5\' 2"'Jz$  (1.575 m)   Wt 143 lb 9.6 oz (65.1 kg)   SpO2 97%   BMI 26.26 kg/m   Opioid Risk Score:   Fall Risk Score:   `1  Depression screen PHQ 2/9  Depression screen [facility] 2/9 12/01/2020 01/04/2016 12/06/2015 10/27/2015 09/29/2015 08/30/2015 07/28/2015  Decreased Interest 3 0 0 0 0 0 0  Down, Depressed, Hopeless 3 0 0 1 0 0 0  PHQ -  2 Score 6 0 0 1 0 0 0  Altered sleeping - - - - - - -  Tired, decreased energy - - - - - - -  Change in appetite - - - - - - -  Feeling bad or failure about yourself  - - - - - - -  Trouble concentrating - - - - - - -  Moving slowly or fidgety/restless - - - - - - -  Suicidal thoughts - - - - - - -  PHQ-9 Score - - - - - - -  Difficult doing work/chores - - - - - - -  Some recent data might be hidden    Review of Systems  Constitutional: Negative.   HENT: Negative.    Eyes:  Positive for visual disturbance.  Respiratory: Negative.    Cardiovascular: Negative.   Gastrointestinal: Negative.   Endocrine: Negative.   Genitourinary: Negative.   Musculoskeletal:  Positive for arthralgias.  Skin: Negative.   Allergic/Immunologic: Negative.   Neurological: Negative.   Hematological:  Bruises/bleeds easily.       Brilinta  Psychiatric/Behavioral:  Positive for dysphoric mood.   All other systems reviewed and are negative.     Objective:   Physical Exam Gen: no distress, normal appearing HEENT: oral mucosa pink and moist, NCAT Cardio: Reg rate Chest: normal effort, normal rate of breathing Abd: soft, non-distended Ext: no edema Psych: pleasant, normal affect Skin: intact Neuro: Alert and oriented x3. Musculoskeletal: left shoulder with 4/5 SAB and severely limited external rotation. Otherwise with 5/5 strength throughout which is great improvement from last visit! Tight cervical musculature- no significant trigger points. Decrease range of motion in left shoulder.  Psych: pleasant, normal affect, much more positive than last visit!     Assessment & Plan:  Mrs. Lachapelle is a 50 year old woman who presents for f/u of shoulder pain post-stroke.   1) Adhesive capsulitis of  left shoulder: -He strength is much improved since last visit and she does not exhibit significant LUE spasticity. I think her pain is most likely due to frozen shoulder, especially given her severely limited external rotation.  -Can repeat steroid injection and/or nerve block in 1 month. Discussed risks of repeat steroid injections. -SPRINT PNS has provided improvement in pain in axillary distribution. Device removed successfully today. Discussed that we can consider suprascapular nerve in the future if pain becomes severe.  -Continue PRN meloxicam, lidocaine patch, and diclofenac gel for pain relief in interim.  -prescribed OT for range of motion for her left shoulder  2) Impaired mobility and ADLs -Continue daily 45 minute walk -Advised to lift weights to maintain muscle mass and good metabolism.   3) Insomnia: -Recommended walk outside every morning to help reset circadian rhythm -Commended on weaning off Temazepam  4) Depression: -Advised that exercise will help with her depression as well.   5) Spasticity:  -100U Botox to left toe flexors to help with toe curling -keep up stretching -cannot tolerate side effects of the baclofen.   6) Breast cancer: -13 years in remission -discussed benefits of ketogenic diet.   7) Prediabetes: -try to incorporate into your diet some of the following foods which are good for diabetes: 1) cinnamon- imitates effects of insulin, increasing glucose transport into cells ([place] or [place] cinnamon is best, least processed) 2) nuts- can slow down the blood sugar response of carbohydrate rich foods 3) oatmeal- contains and anti-inflammatory compound avenanthramide 4) whole-milk yogurt (best types are no sugar, [place]  yogurt, or goat/sheep yogurt) 5) beans- high in protein, fiber, and vitamins, low glycemic index 6) broccoli- great source of vitamin A and C 7) quinoa- higher in protein and fiber than other grains 8) spinach- high in vitamin A,  fiber, and protein 9) olive oil- reduces glucose levels, LDL, and triglycerides 10) salmon- excellent amount of omega-3-fatty acids 11) walnuts- rich in antioxidants 12) apples- high in fiber and quercetin 13) carrots- highly nutritious with low impact on blood sugar 14) eggs- improve HDL (good cholesterol), high in protein, keep you satiated 15) turmeric: improves blood sugars, cardiovascular disease, and protects kidney health 16) garlic: improves blood sugar, blood pressure, pain 17) tomatoes: highly nutritious with low impact on blood sugar  8) Dysthymia -neurospych appointment scheduled with dr. Sima Matas  9) Pseuobulbar affect -discussed Nuedexta- she would like to try- prescribed

## 2020-12-02 ENCOUNTER — Telehealth: Payer: Self-pay | Admitting: *Deleted

## 2020-12-02 NOTE — Telephone Encounter (Signed)
arkie martucci   (Key: [place]) [id number] 20-'10MG'$  capsules     Status: PA Response - Approved  Through 04/08/2021

## 2020-12-06 ENCOUNTER — Other Ambulatory Visit: Payer: Self-pay

## 2020-12-06 ENCOUNTER — Telehealth (HOSPITAL_COMMUNITY): Payer: Self-pay | Admitting: Student

## 2020-12-06 ENCOUNTER — Ambulatory Visit (HOSPITAL_COMMUNITY)
Admission: RE | Admit: 2020-12-06 | Discharge: 2020-12-06 | Disposition: A | Discharge status: Home / Self Care | Payer: Medicare Other | Source: Ambulatory Visit | Attending: Interventional Radiology | Admitting: Interventional Radiology

## 2020-12-06 DIAGNOSIS — I771 Stricture of artery: Secondary | ICD-10-CM

## 2020-12-06 NOTE — Progress Notes (Signed)
Carotid duplex bilateral study completed.   Called and left message with IR on patient's behalf. Patient concerned she is running low on anticoagulants.   Please see CV Proc for preliminary results.   Darlin Coco, RDMS, RVT

## 2020-12-06 NOTE — Progress Notes (Signed)
Encounter opened in error

## 2020-12-13 ENCOUNTER — Encounter (HOSPITAL_COMMUNITY): Payer: Self-pay | Admitting: Emergency Medicine

## 2020-12-13 ENCOUNTER — Emergency Department (HOSPITAL_COMMUNITY): Payer: Medicare Other

## 2020-12-13 ENCOUNTER — Emergency Department (HOSPITAL_COMMUNITY)
Admission: EM | Admit: 2020-12-13 | Discharge: 2020-12-13 | Disposition: A | Discharge status: Home / Self Care | Payer: Medicare Other | Attending: Emergency Medicine | Admitting: Emergency Medicine

## 2020-12-13 ENCOUNTER — Other Ambulatory Visit: Payer: Self-pay

## 2020-12-13 DIAGNOSIS — S2241XA Multiple fractures of ribs, right side, initial encounter for closed fracture: Secondary | ICD-10-CM | POA: Diagnosis not present

## 2020-12-13 DIAGNOSIS — S299XXA Unspecified injury of thorax, initial encounter: Secondary | ICD-10-CM | POA: Diagnosis present

## 2020-12-13 DIAGNOSIS — Z79899 Other long term (current) drug therapy: Secondary | ICD-10-CM | POA: Diagnosis not present

## 2020-12-13 DIAGNOSIS — I10 Essential (primary) hypertension: Secondary | ICD-10-CM | POA: Diagnosis not present

## 2020-12-13 DIAGNOSIS — S139XXA Sprain of joints and ligaments of unspecified parts of neck, initial encounter: Secondary | ICD-10-CM

## 2020-12-13 DIAGNOSIS — Z87891 Personal history of nicotine dependence: Secondary | ICD-10-CM | POA: Insufficient documentation

## 2020-12-13 DIAGNOSIS — S134XXA Sprain of ligaments of cervical spine, initial encounter: Secondary | ICD-10-CM | POA: Diagnosis not present

## 2020-12-13 DIAGNOSIS — J45909 Unspecified asthma, uncomplicated: Secondary | ICD-10-CM | POA: Insufficient documentation

## 2020-12-13 DIAGNOSIS — E119 Type 2 diabetes mellitus without complications: Secondary | ICD-10-CM | POA: Insufficient documentation

## 2020-12-13 DIAGNOSIS — Z7982 Long term (current) use of aspirin: Secondary | ICD-10-CM | POA: Insufficient documentation

## 2020-12-13 DIAGNOSIS — Y92410 Unspecified street and highway as the place of occurrence of the external cause: Secondary | ICD-10-CM | POA: Insufficient documentation

## 2020-12-13 DIAGNOSIS — Z853 Personal history of malignant neoplasm of breast: Secondary | ICD-10-CM | POA: Insufficient documentation

## 2020-12-13 MED ORDER — HYDROMORPHONE HCL 1 MG/ML IJ SOLN
1.0000 mg | Freq: Once | INTRAMUSCULAR | Status: AC
  Administered 2020-12-13: 1 mg via INTRAMUSCULAR
  Filled 2020-12-13: qty 1

## 2020-12-13 MED ORDER — HYDROMORPHONE HCL 2 MG PO TABS: 2.0000 mg | ORAL_TABLET | ORAL | 0 refills | Status: DC | PRN
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
## 2020-12-13 NOTE — Discharge Instructions (Addendum)
You have broken what is likely to ribs on the right side.  Take the pain medicine to help.  Call your pain management doctor to help adjust your pain protocol.  Watch for worsening shortness of breath or more trouble breathing.

## 2020-12-13 NOTE — ED Notes (Signed)
Rt call for incentive spirometry teaching.
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
## 2020-12-21 ENCOUNTER — Other Ambulatory Visit: Payer: Self-pay

## 2020-12-21 ENCOUNTER — Telehealth (HOSPITAL_COMMUNITY): Payer: Self-pay

## 2020-12-21 ENCOUNTER — Encounter: Payer: Medicare Other | Attending: Physical Medicine and Rehabilitation | Admitting: Psychology

## 2020-12-21 DIAGNOSIS — F341 Dysthymic disorder: Secondary | ICD-10-CM | POA: Diagnosis not present

## 2020-12-21 DIAGNOSIS — F482 Pseudobulbar affect: Secondary | ICD-10-CM | POA: Insufficient documentation

## 2020-12-21 DIAGNOSIS — Z8673 Personal history of transient ischemic attack (TIA), and cerebral infarction without residual deficits: Secondary | ICD-10-CM

## 2020-12-21 DIAGNOSIS — R531 Weakness: Secondary | ICD-10-CM | POA: Diagnosis not present

## 2020-12-21 DIAGNOSIS — G4701 Insomnia due to medical condition: Secondary | ICD-10-CM

## 2020-12-21 NOTE — Telephone Encounter (Signed)
Pt agreed to f/u in 4 months with US carotid. AW - has concerns about stopping Brilinta, she is terrified. Will reach out to PA to discuss with Deveshwar.

## 2020-12-22 ENCOUNTER — Telehealth: Payer: Self-pay | Admitting: Student

## 2020-12-22 ENCOUNTER — Other Ambulatory Visit: Payer: Self-pay | Admitting: Student

## 2020-12-22 DIAGNOSIS — I63231 Cerebral infarction due to unspecified occlusion or stenosis of right carotid arteries: Secondary | ICD-10-CM

## 2020-12-22 MED ORDER — CLOPIDOGREL BISULFATE 75 MG PO TABS: 75.0000 mg | ORAL_TABLET | Freq: Every day | ORAL | 1 refills | Status: AC

## 2020-12-22 NOTE — Telephone Encounter (Signed)
Ann Lopez was contacted re: prescription refill request for Brilinta as well as to scheduler her US Carotid.  Due to stability clinically and by imaging for approx 2 years, discussion held re: discontinuing DAPT and continuing with aspirin '81mg'$  daily.  Patient reports anxiety related to stopping her DAPT therapy.  She feels her stability for the past 2 years is, in part, due to her medication management.  Transition to Plavix '75mg'$  PO daily, aspirin 81 mg daily.  [facility] called [facility] [address] per her request. May d/c Brilinta.   Patient verbalizes understanding.   Brynda Greathouse, MS RD PA-C

## 2021-01-04 ENCOUNTER — Ambulatory Visit: Payer: Medicare Other | Attending: Occupational Therapy | Admitting: Occupational Therapy

## 2021-01-29 IMAGING — DX DG ABDOMEN 1V
1 series · 1 of 1 positions shown · non-contrast
Comparison: None.

CLINICAL DATA: NG tube placement

EXAM:
ABDOMEN - 1 VIEW

[abdomen kub]
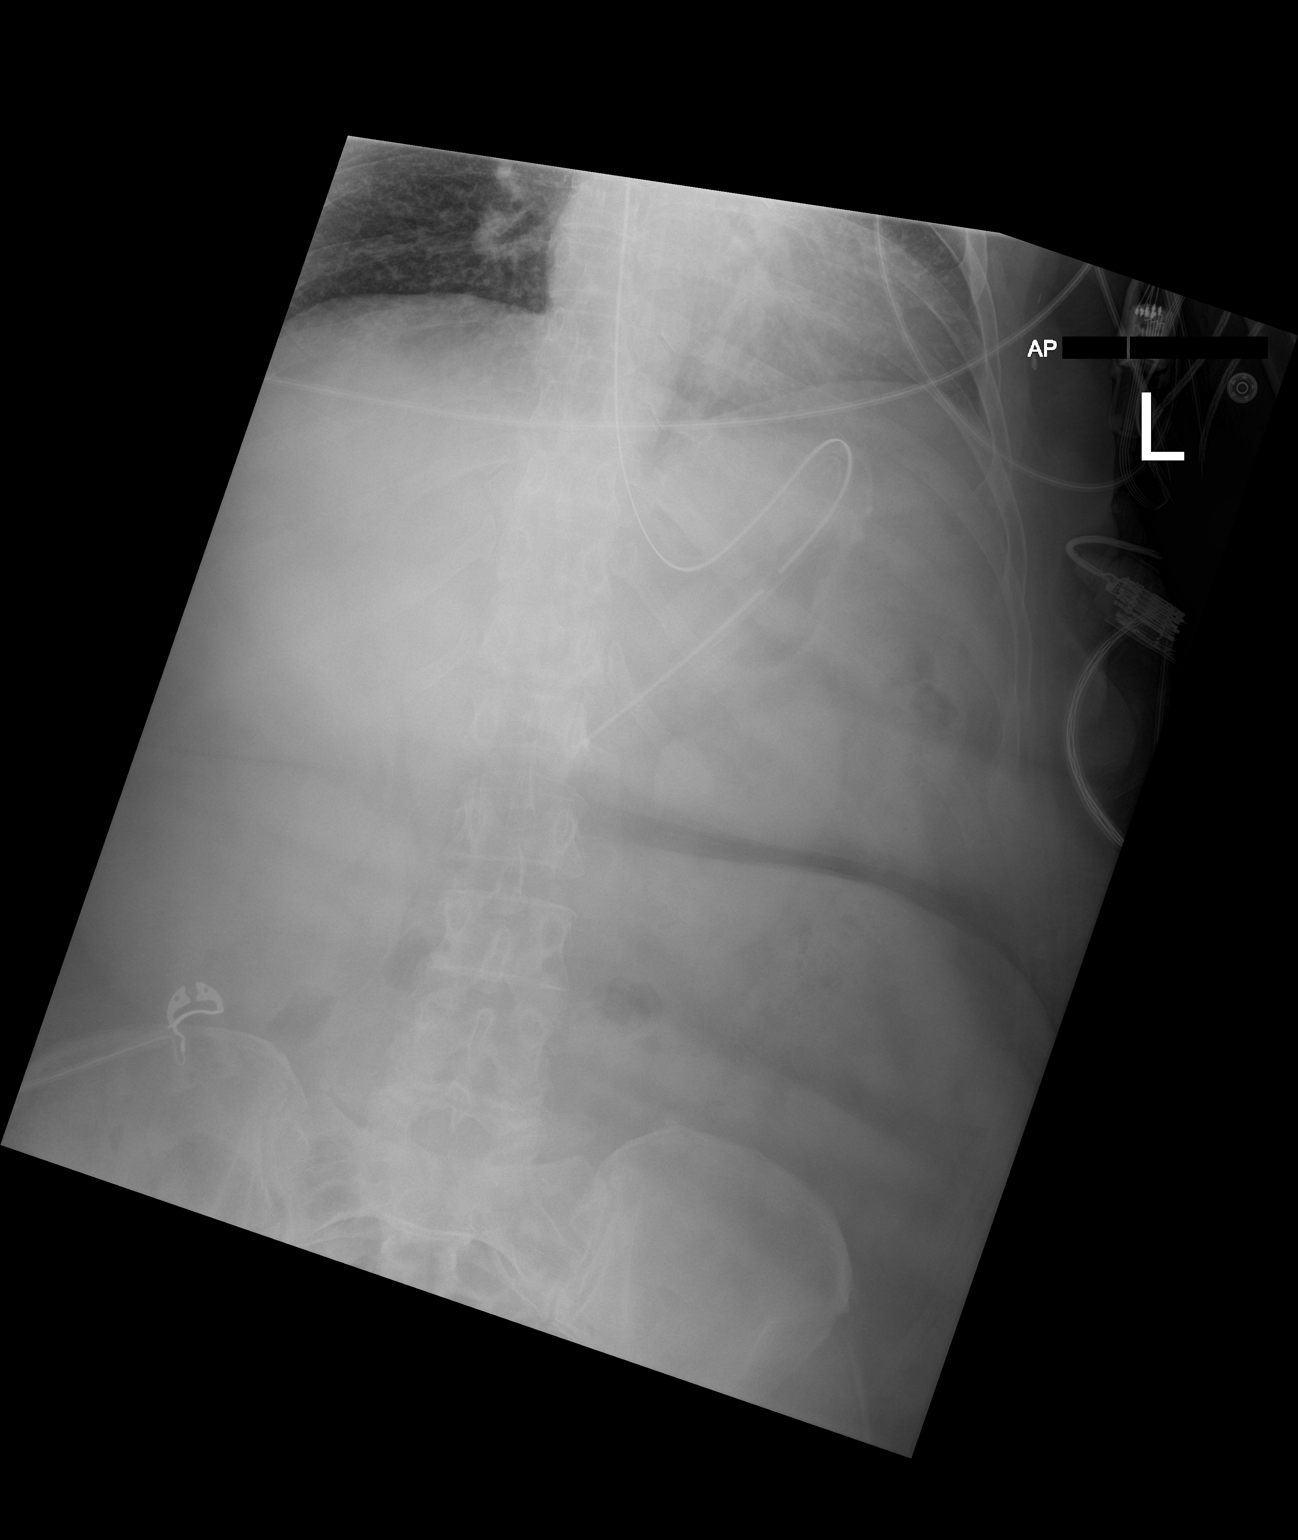

[1 of 1 positions shown; findings below may reference images not displayed]

FINDINGS: Esophageal tube tip overlies the gastric body. Nonobstructed
bowel-gas pattern
IMPRESSION: Esophageal tube tip overlies the gastric body

## 2021-01-29 IMAGING — CT CT HEAD W/O CM
4 series · 16 of 47 positions shown, 18 images · non-contrast
Comparison: CT head and CT perfusion 03/02/2019

CLINICAL DATA: Status post revascularization of the occluded right
ICA terminus.

EXAM:
CT HEAD WITHOUT CONTRAST
TECHNIQUE: Contiguous axial images were obtained from the base of the skull
through the vertex without intravenous contrast.

[Series 3: head wo · axial · 0.41mm/px · z∈[+1034,+1154]mm · 7 of 34 slices shown, 9 images]
[im 5/34  brain]
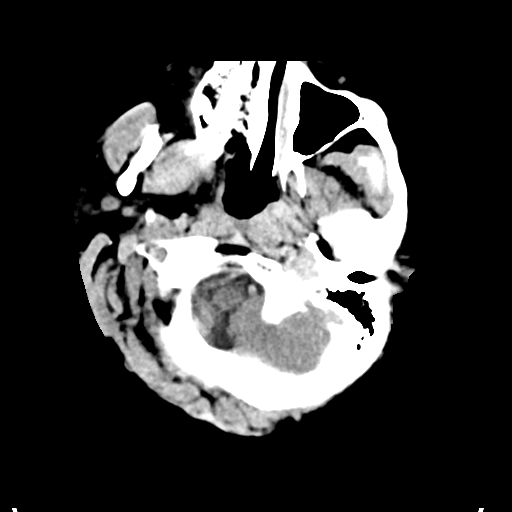
[im 5/34  bone]
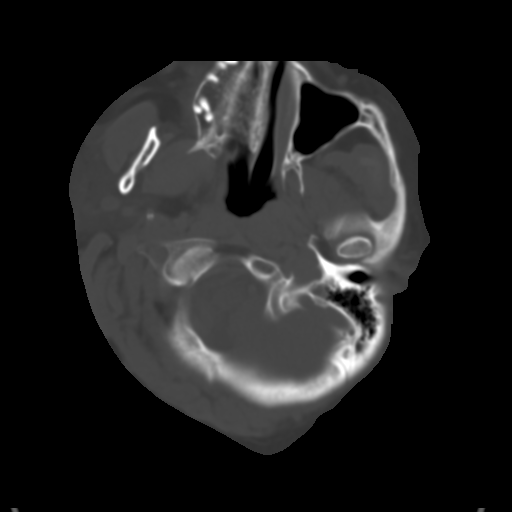
[im 9/34  brain]
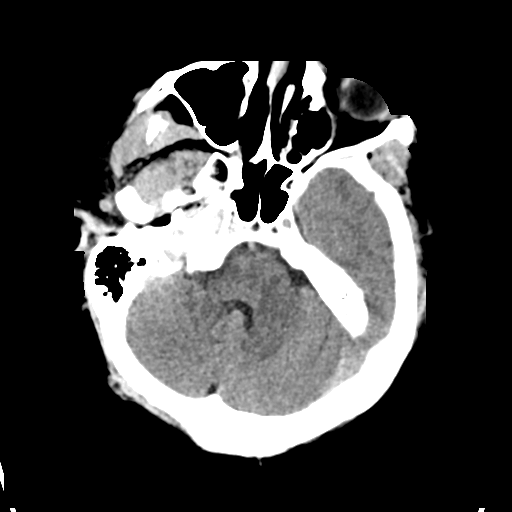
[im 13/34  brain]
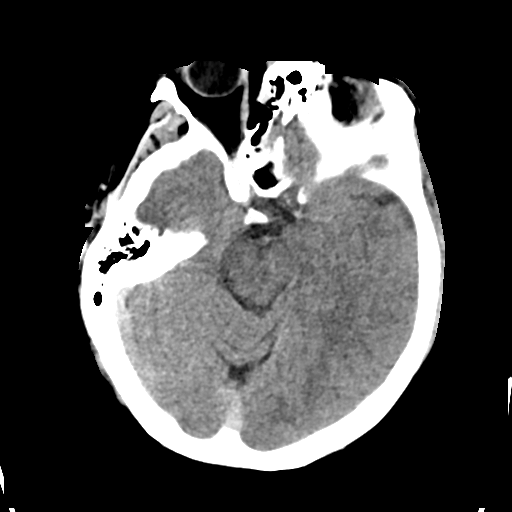
[im 17/34  brain]
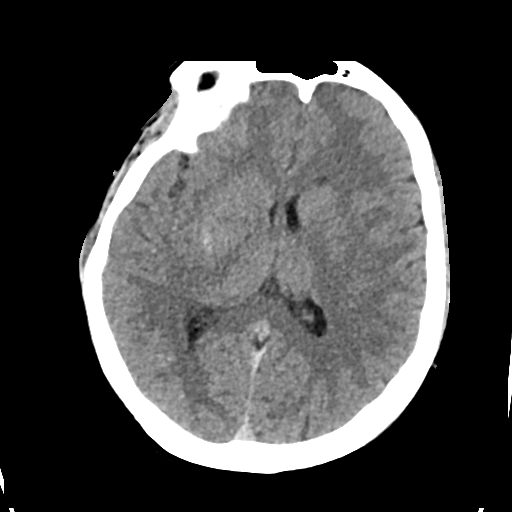
[im 21/34  brain]
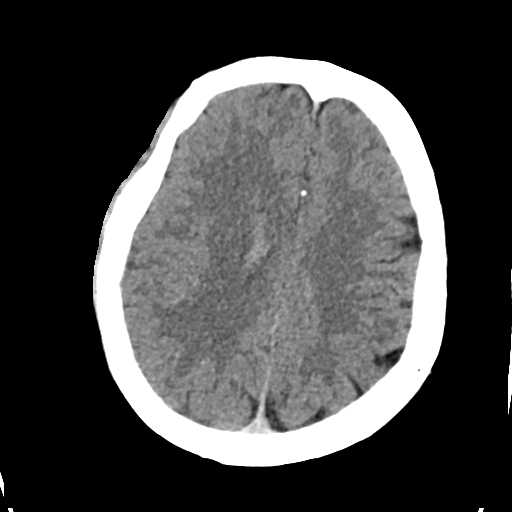
[im 21/34  bone]
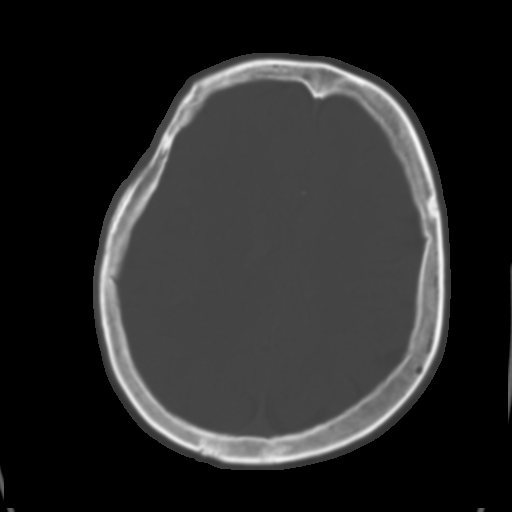
[im 25/34  brain]
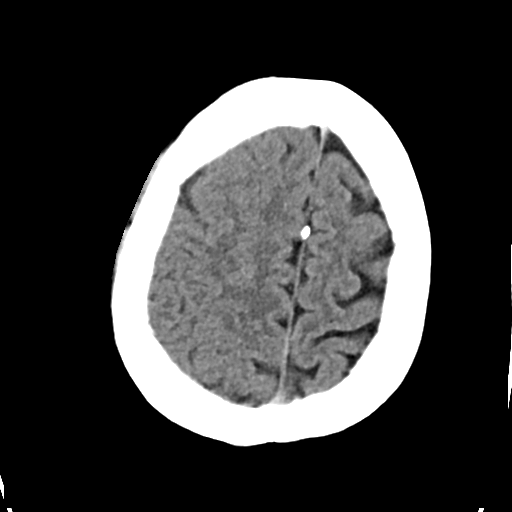
[im 29/34  brain]
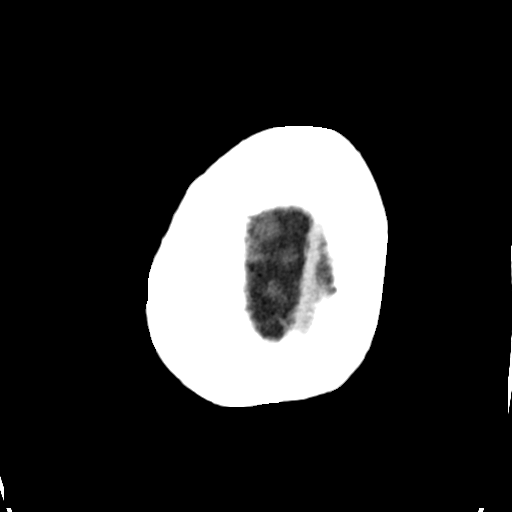

[Series 4: head bone · axial · 0.41mm/px · z∈[+1030,+1062]mm · 3 of 84 slices shown]
[im 9/84  bone]
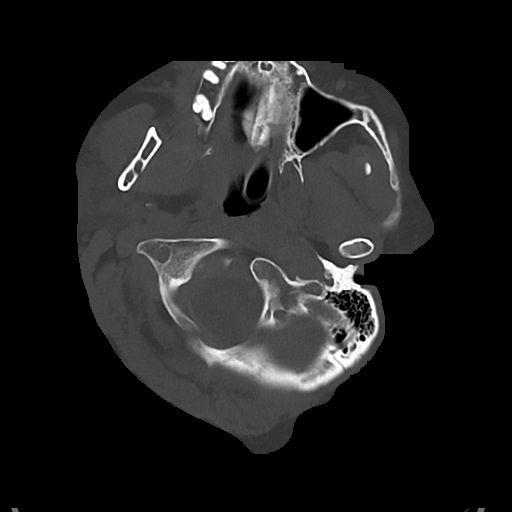
[im 17/84  bone]
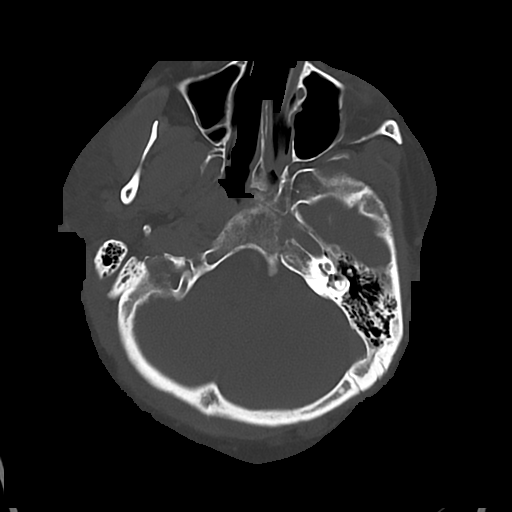
[im 25/84  bone]
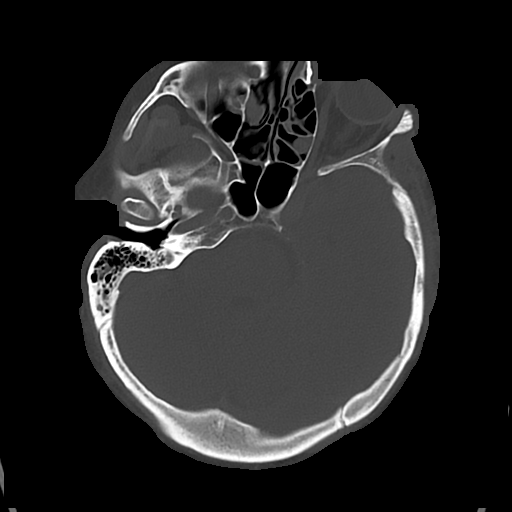

[Series 5: cor soft · coronal · 0.38mm/px · 3 of 65 slices shown]
[im 22/65  brain]
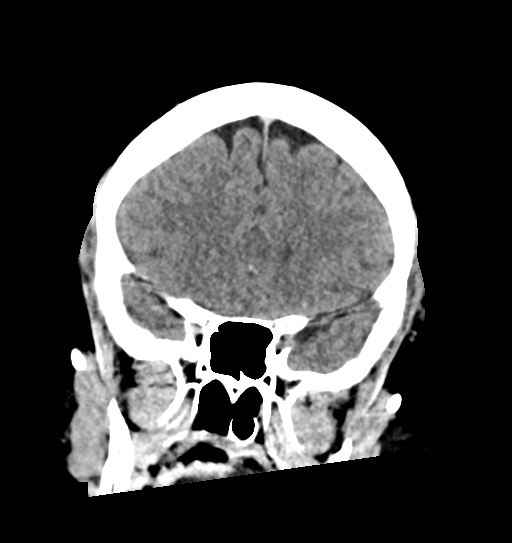
[im 29/65  brain]
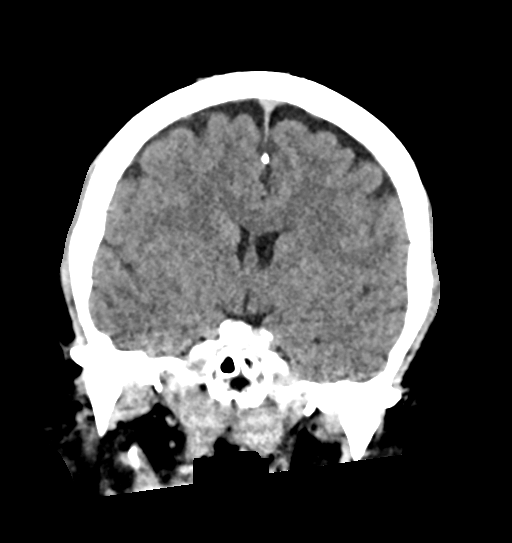
[im 36/65  brain]
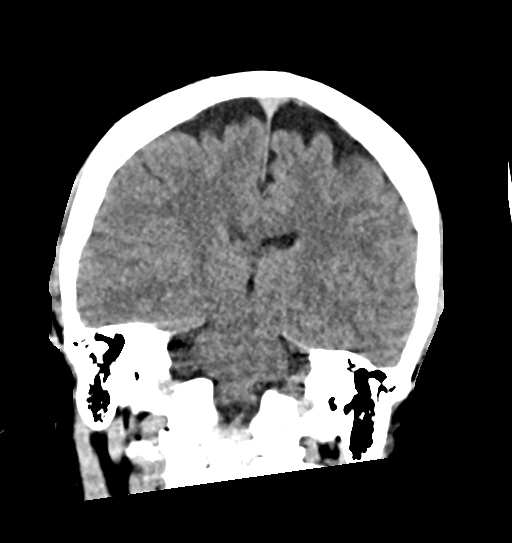

[Series 6: sag soft · sagittal · 0.36mm/px · 3 of 55 slices shown]
[im 23/55  brain]
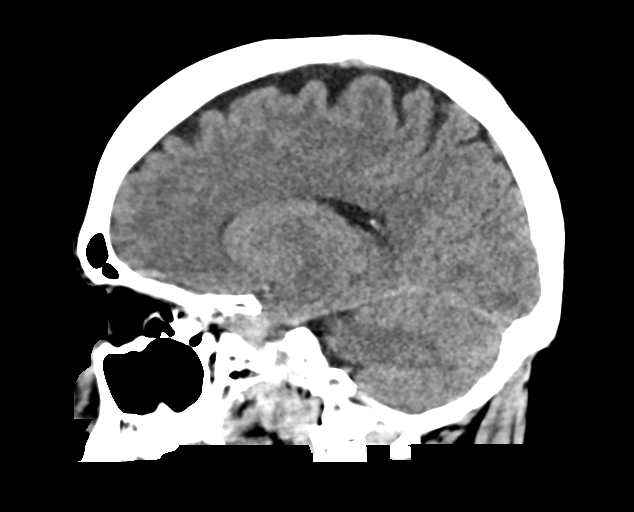
[im 28/55  brain]
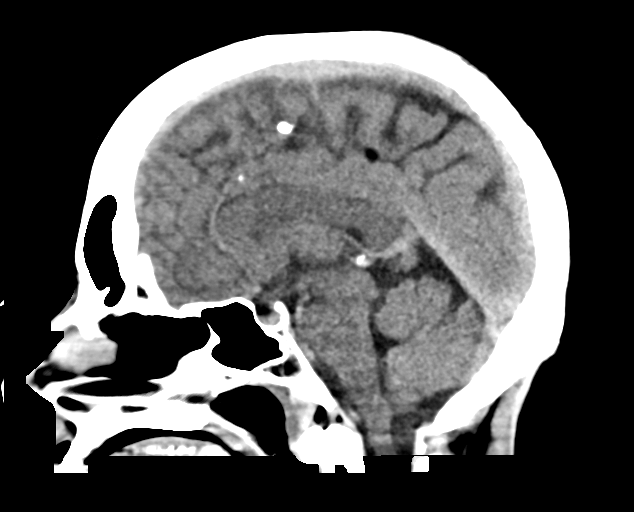
[im 32/55  brain]
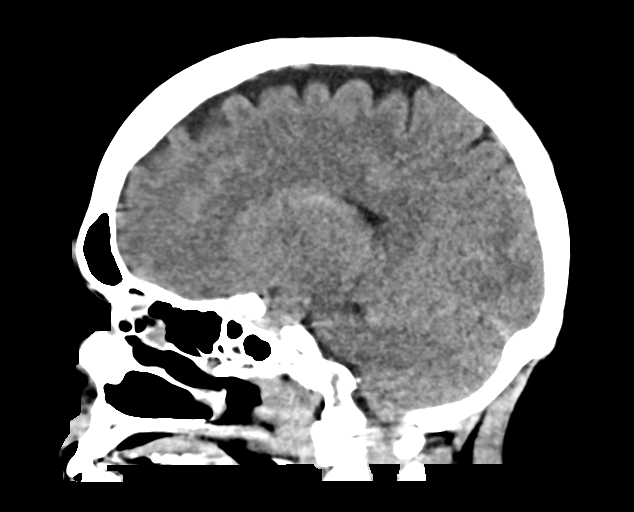

[16 of 47 positions shown; findings below may reference images not displayed]

FINDINGS: Brain: There is contrast staining within the core infarct area of
the right lentiform nucleus. There is also some contrast staining
within the body of the right caudate lobe. No other focal areas of
contrast enhancement is present.

Gray-white differentiation is otherwise normal. Acute hemorrhage or
mass lesion is present. There is some mass effect on the right
lateral ventricle.

Vascular: No hyperdense vessel or unexpected calcification.  The

Skull: Calvarium is intact. No focal lytic or blastic lesions are
present.

Sinuses/Orbits: Mild mucosal thickening is present inferior right
maxillary sinus. Is scattered ethmoid opacification and some mucosal
thickening in the inferior right frontal sinus. No fluid levels are
present. Mastoid air cells are clear. Globes and orbits are within
normal limits.
IMPRESSION: 1. Contrast staining in the right lentiform nucleus and caudate
following revascularization. This is consistent with areas
infarction.
2. Remainder of right MCA territory is within normal limits.
3. No acute hemorrhage.
4. Mild sinus disease.

## 2021-02-26 ENCOUNTER — Encounter: Payer: Self-pay | Admitting: Psychology

## 2021-02-26 NOTE — Progress Notes (Signed)
Neuropsychological Consultation   Patient:   Ann Lopez   DOB:   09-24-1970  MR Number:  [id number]  Location:  [facility] [place], [place] [id number] [address] [place] [address] Dept: [phone]           Date of Service:   12/21/2020  Start Time:   1 PM End Time:   3 PM  Today's visit was an in person visit was conducted in outpatient clinic office.  The first hour and 15 minutes were spent in formal face-to-face clinical interview and the other 45 minutes was spent with record review, report writing and setting up testing protocols.  Provider/Observer:  Ilean Skill, Psy.D.       Clinical Neuropsychologist       Billing Code/Service: [id number]  Chief Complaint:    Ann Lopez is a 50 year old female referred by Leeroy Cha, MD for neuropsychological consultation due to ongoing emotional distress after her stroke.  The patient is also had left-sided weakness with gait impairments but has been improving.  The patient complains of issues not only having very flat/numb feeling state but also times when she is having a great deal of emotional distress, laughing at inappropriate times and being tearful.  The patient is also being treated for left shoulder adhesive meant capsulitis and is receiving injections and other therapies.  The patient had past issues with depression but feels that her depressive and emotional symptomatology are different than before.  The patient was hospitalized in November 2020 with sudden onset of left-sided weakness with neglect.  Patient with left-sided weakness as well as right gaze preference with CT scan showing a relatively large right-sided penumbra with right M1 segment occlusion.  Patient had emergent thrombectomy and required rescue ICA stenting and successful revascularization of the terminal right ICA, right ACA and MCA with TICI 2C  reperfusion.  Reason for Service:  Ann Lopez is a 50 year old female referred by Leeroy Cha, MD for neuropsychological consultation due to ongoing emotional distress after her stroke.  The patient is also had left-sided weakness with gait impairments but has been improving.  The patient complains of issues not only having very flat/numb feeling state but also times when she is having a great deal of emotional distress, laughing at inappropriate times and being tearful.  The patient is also being treated for left shoulder adhesive meant capsulitis and is receiving injections and other therapies.  The patient had past issues with depression but feels that her depressive and emotional symptomatology are different than before.  The patient was hospitalized in November 2020 with sudden onset of left-sided weakness with neglect.  Patient with left-sided weakness as well as right gaze preference with CT scan showing a relatively large right-sided penumbra with right M1 segment occlusion.  Patient had emergent thrombectomy and required rescue ICA stenting and successful revascularization of the terminal right ICA, right ACA and MCA with TICI 2C reperfusion.  Patient describes significant changes in her mood state.  She reports that she now feels like things that used to upset her no longer do and that she can no longer cry.  The patient reports that she has been laughing in serious situations and this really bothers her.  The patient reports that she is always been very sympathetic person.  The patient reports that her past medical history included diagnosis of breast cancer and her stroke is primary prior hospitalizations.  She  reports that she does have some issues with memory but they are not severe.  She also reports that she does not have very good appetite.  She reports that she has insomnia but it is improved and at times she takes Restoril to help with her sleep pattern.  Behavioral  Observation: Ann Lopez  presents as a 50 y.o.-year-old Right handed Caucasian Female who appeared her stated age. her dress was Appropriate and she was Well Groomed and her manners were Appropriate to the situation.  her participation was indicative of Appropriate behaviors.  There were physical disabilities noted.  she displayed an appropriate level of cooperation and motivation.     Interactions:    Active Appropriate  Attention:   within normal limits and attention span and concentration were age appropriate  Memory:   within normal limits; recent and remote memory intact  Visuo-spatial:  not examined  Speech (Volume):  normal  Speech:   normal; normal  Thought Process:  Coherent and Relevant  Though Content:  WNL; not suicidal and not homicidal  Orientation:   person, place, time/date, and situation  Judgment:   Good  Planning:   Good  Affect:    Appropriate  Mood:    Dysphoric  Insight:   Good  Intelligence:   normal  Marital Status/Living: The patient was born and raised in [place] along with 4 siblings.  The patient lives with her boyfriend.  The patient has 46 female children ages 57, 48, 35 and 47.  They are all in good mental and medical health.  Current Employment: Currently working.  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  Substance Use:  No concerns of substance abuse are reported.  The patient admits to some tobacco use.  Education:   The patient completed her GED and has taken some college courses.  Medical History:   Past Medical History:  Diagnosis Date   Allergy    Anxiety    Asthma    Back pain    Breast cancer ([place])    Cancer ([place])    Depression    Diabetes mellitus    Headache    Hyperlipemia    Osteopenia    Osteoporosis    osteopenia   Stroke [facility])          Patient Active Problem List   Diagnosis Date Noted   Lymph node enlargement    Cellulitis of groin,  right    Cellulitis    Sepsis ([place]) 11/20/2019   Acute inguinal lymphadenitis    Vascular headache    Other chronic pain    Uncontrolled type 2 diabetes mellitus with hyperglycemia (HCC)    Labile blood glucose    Panic disorder with agoraphobia and moderate panic attacks    Major depressive disorder, recurrent episode, moderate (HCC)    MCI (mild cognitive impairment) with memory loss    Acute ischemic right middle cerebral artery (MCA) stroke ([place]) 03/09/2019   Left hemiparesis (HCC)    Bradycardia    Tachypnea    SIRS (systemic inflammatory response syndrome) (HCC)    Hypokalemia    Diabetes mellitus type 2 in obese ([place])    Tobacco abuse    History of breast cancer    Cognitive deficits    Dysphagia, post-stroke    Stroke (cerebrum) ([place]) 03/02/2019   Middle cerebral artery embolism, right 03/02/2019   Chest pain 12/19/2015   Numbness 12/19/2015  Femoral hernia 03/24/2014   Dyslipidemia 09/10/2011   BRCA1 positive 09/10/2011   Breast cancer ([place]) 09/10/2011   H/O gastric bypass; 05/14/11([facility]) 09/10/2011   Essential hypertension 10/28/2008   DEPRESSION/ANXIETY 02/19/2007   CARPAL TUNNEL SYNDROME, RIGHT 02/19/2007   ALLERGIC RHINITIS 02/19/2007   GERD 02/19/2007   IRRITABLE BOWEL SYNDROME 02/19/2007   HERPES GENITALIS 12/30/2006   Diabetes ([place]) 12/30/2006   OSTEOPENIA 12/30/2006   ANOMALY, CONGENITAL, NERVOUS SYSTEM NEC 12/30/2006         Psychiatric History:  No prior psychiatric history  Family Med/Psych History:  Family History  Problem Relation Age of Onset   Cancer Mother 40       breast cancer   Hypertension Sister    Hypothyroidism Sister    Cancer Sister    Hypothyroidism Brother    Cancer Maternal Grandmother 27       breast cancer   Cancer Maternal Grandfather 55       pancreatic cancer    Impression/DX:  Ann Lopez is a 50 year old female referred by Leeroy Cha, MD for neuropsychological consultation due to ongoing emotional  distress after her stroke.  The patient is also had left-sided weakness with gait impairments but has been improving.  The patient complains of issues not only having very flat/numb feeling state but also times when she is having a great deal of emotional distress, laughing at inappropriate times and being tearful.  The patient is also being treated for left shoulder adhesive meant capsulitis and is receiving injections and other therapies.  The patient had past issues with depression but feels that her depressive and emotional symptomatology are different than before.  The patient was hospitalized in November 2020 with sudden onset of left-sided weakness with neglect.  Patient with left-sided weakness as well as right gaze preference with CT scan showing a relatively large right-sided penumbra with right M1 segment occlusion.  Patient had emergent thrombectomy and required rescue ICA stenting and successful revascularization of the terminal right ICA, right ACA and MCA with TICI 2C reperfusion.  Disposition/Plan:  We set the patient up for individual psychotherapeutic interventions to help her with some of the coping and adjustment issues and confusion she has around changes in emotional status and response patterns.  Diagnosis:    Pseudobulbar affect  Dysthymic disorder  Weakness  Insomnia due to medical condition         Electronically Signed   _______________________ Ilean Skill, Psy.D. Clinical Neuropsychologist

## 2021-03-09 ENCOUNTER — Encounter: Payer: Medicare Other | Admitting: Physical Medicine and Rehabilitation

## 2021-03-28 ENCOUNTER — Telehealth: Payer: Self-pay

## 2021-03-28 NOTE — Telephone Encounter (Signed)
Nuedexta PA sent to patient's plan

## 2021-03-29 ENCOUNTER — Encounter: Payer: Self-pay | Admitting: *Deleted

## 2021-03-29 ENCOUNTER — Other Ambulatory Visit (HOSPITAL_COMMUNITY): Payer: Self-pay | Admitting: Interventional Radiology

## 2021-03-29 ENCOUNTER — Telehealth (HOSPITAL_COMMUNITY): Payer: Self-pay

## 2021-03-29 ENCOUNTER — Other Ambulatory Visit (HOSPITAL_COMMUNITY): Payer: Self-pay | Admitting: Student

## 2021-03-29 DIAGNOSIS — I639 Cerebral infarction, unspecified: Secondary | ICD-10-CM

## 2021-03-29 DIAGNOSIS — I771 Stricture of artery: Secondary | ICD-10-CM

## 2021-03-29 NOTE — Telephone Encounter (Addendum)
NUEDEXTA CAP 20-10MG , use as directed, is approved through 04/08/2022 under your Medicare Part D benefit. Pharmacy and Ms Mckenna notified.

## 2021-03-29 NOTE — Telephone Encounter (Signed)
Called to schedule us carotid, no answer, left vm. AW  

## 2021-04-04 ENCOUNTER — Other Ambulatory Visit: Payer: Self-pay

## 2021-04-04 ENCOUNTER — Encounter
Payer: Medicare Other | Attending: Physical Medicine and Rehabilitation | Admitting: Physical Medicine and Rehabilitation

## 2021-04-04 ENCOUNTER — Encounter: Payer: Self-pay | Admitting: Physical Medicine and Rehabilitation

## 2021-04-04 VITALS — Ht 62.0 in | Wt 145.2 lb

## 2021-04-04 DIAGNOSIS — R252 Cramp and spasm: Secondary | ICD-10-CM | POA: Diagnosis not present

## 2021-04-04 DIAGNOSIS — I69398 Other sequelae of cerebral infarction: Secondary | ICD-10-CM | POA: Diagnosis present

## 2021-04-04 NOTE — Progress Notes (Signed)
Botox Injection for spasticity using needle EMG guidance  Indication: Severe spasticity which interferes with ADL,mobility and/or  hygiene and is unresponsive to medication management and other conservative care Informed consent was obtained after describing risks and benefits of the procedure with the patient. This includes bleeding, bruising, infection, excessive weakness, or medication side effects. A REMS form is on file and signed.  100U in left lower extremity flexor digitorum longus  All injections were done after obtaining appropriate EMG activity and after negative drawback for blood. The patient tolerated the procedure well. Post procedure instructions were given. A followup appointment was made.

## 2021-04-14 ENCOUNTER — Other Ambulatory Visit: Payer: Self-pay | Admitting: Cardiovascular Disease

## 2021-04-24 ENCOUNTER — Telehealth (HOSPITAL_COMMUNITY): Payer: Self-pay

## 2021-04-24 NOTE — Telephone Encounter (Signed)
Called to schedule us carotid, no answer, left vm. AW  

## 2021-05-29 ENCOUNTER — Telehealth (HOSPITAL_COMMUNITY): Payer: Self-pay

## 2021-05-29 ENCOUNTER — Ambulatory Visit: Payer: Medicare Other | Admitting: Adult Health

## 2021-05-29 NOTE — Telephone Encounter (Signed)
Called to reschedule US carotid, no answer, left vm. AW

## 2021-05-30 ENCOUNTER — Other Ambulatory Visit: Payer: Self-pay

## 2021-05-30 ENCOUNTER — Encounter: Payer: Medicare Other | Attending: Physical Medicine and Rehabilitation | Admitting: Psychology

## 2021-05-30 DIAGNOSIS — F482 Pseudobulbar affect: Secondary | ICD-10-CM | POA: Insufficient documentation

## 2021-05-30 DIAGNOSIS — G4701 Insomnia due to medical condition: Secondary | ICD-10-CM | POA: Diagnosis present

## 2021-05-30 DIAGNOSIS — I69398 Other sequelae of cerebral infarction: Secondary | ICD-10-CM | POA: Insufficient documentation

## 2021-05-30 DIAGNOSIS — F341 Dysthymic disorder: Secondary | ICD-10-CM | POA: Diagnosis not present

## 2021-05-30 DIAGNOSIS — R252 Cramp and spasm: Secondary | ICD-10-CM | POA: Diagnosis not present

## 2021-05-30 DIAGNOSIS — Z8673 Personal history of transient ischemic attack (TIA), and cerebral infarction without residual deficits: Secondary | ICD-10-CM | POA: Diagnosis not present

## 2021-06-01 ENCOUNTER — Encounter: Payer: Self-pay | Admitting: Psychology

## 2021-06-01 NOTE — Progress Notes (Signed)
Neuropsychological Consultation   Patient:   Ann Lopez   DOB:   22-Sep-1970  MR Number:  [id number]  Location:  [facility] [place], [place] [id number] [address] [place] [address] Dept: [phone]           Date of Service:   05/30/2021  Start Time:   10 AM End Time:   11 AM  Today's visit was an in person visit that was conducted in outpatient clinic office.  Today we work specifically on therapeutic interventions.  The visit was 1 hour in duration.  Provider/Observer:  Ilean Skill, Psy.D.       Clinical Neuropsychologist       Billing Code/Service: [id number]/[id number]  Chief Complaint:    Ann Lopez is a 51 year old female referred by Leeroy Cha, MD for neuropsychological consultation due to ongoing emotional distress after her stroke.  The patient is also had left-sided weakness with gait impairments but has been improving.  The patient complains of issues not only having very flat/numb feeling state but also times when she is having a great deal of emotional distress, laughing at inappropriate times and being tearful.  The patient is also being treated for left shoulder adhesive meant capsulitis and is receiving injections and other therapies.  The patient had past issues with depression but feels that her depressive and emotional symptomatology are different than before.  The patient was hospitalized in November 2020 with sudden onset of left-sided weakness with neglect.  Patient with left-sided weakness as well as right gaze preference with CT scan showing a relatively large right-sided penumbra with right M1 segment occlusion.  Patient had emergent thrombectomy and required rescue ICA stenting and successful revascularization of the terminal right ICA, right ACA and MCA with TICI 2C reperfusion.  Reason for Service:  Ann Lopez is a 51 year old female referred by  Leeroy Cha, MD for neuropsychological consultation due to ongoing emotional distress after her stroke.  The patient is also had left-sided weakness with gait impairments but has been improving.  The patient complains of issues not only having very flat/numb feeling state but also times when she is having a great deal of emotional distress, laughing at inappropriate times and being tearful.  The patient is also being treated for left shoulder adhesive meant capsulitis and is receiving injections and other therapies.  The patient had past issues with depression but feels that her depressive and emotional symptomatology are different than before.  The patient was hospitalized in November 2020 with sudden onset of left-sided weakness with neglect.  Patient with left-sided weakness as well as right gaze preference with CT scan showing a relatively large right-sided penumbra with right M1 segment occlusion.  Patient had emergent thrombectomy and required rescue ICA stenting and successful revascularization of the terminal right ICA, right ACA and MCA with TICI 2C reperfusion.  Patient describes significant changes in her mood state.  She reports that she now feels like things that used to upset her no longer do and that she can no longer cry.  The patient reports that she has been laughing in serious situations and this really bothers her.  The patient reports that she is always been very sympathetic person.  The patient reports that her past medical history included diagnosis of breast cancer and her stroke is primary prior hospitalizations.  She reports that she does have some issues with memory but they are not severe.  She also reports that she does not have very good appetite.  She reports that she has insomnia but it is improved and at times she takes Restoril to help with her sleep pattern.  Behavioral Observation: Ann Lopez  presents as a 51 y.o.-year-old Right handed Caucasian Female who appeared  her stated age. her dress was Appropriate and she was Well Groomed and her manners were Appropriate to the situation.  her participation was indicative of Appropriate behaviors.  There were physical disabilities noted.  she displayed an appropriate level of cooperation and motivation.     Interactions:    Active Appropriate  Attention:   within normal limits and attention span and concentration were age appropriate  Memory:   within normal limits; recent and remote memory intact  Visuo-spatial:  not examined  Speech (Volume):  normal  Speech:   normal; normal  Thought Process:  Coherent and Relevant  Though Content:  WNL; not suicidal and not homicidal  Orientation:   person, place, time/date, and situation  Judgment:   Good  Planning:   Good  Affect:    Appropriate  Mood:    Dysphoric  Insight:   Good  Intelligence:   normal  Marital Status/Living: The patient was born and raised in [place] along with 4 siblings.  The patient lives with her boyfriend.  The patient has 24 female children ages 62, 37, 22 and 56.  They are all in good mental and medical health.  Current Employment: Currently working.  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  Substance Use:  No concerns of substance abuse are reported.  The patient admits to some tobacco use.  Education:   The patient completed her GED and has taken some college courses.  Medical History:   Past Medical History:  Diagnosis Date   Allergy    Anxiety    Asthma    Back pain    Breast cancer ([place])    Cancer ([place])    Depression    Diabetes mellitus    Headache    Hyperlipemia    Osteopenia    Osteoporosis    osteopenia   Stroke [facility])          Patient Active Problem List   Diagnosis Date Noted   Lymph node enlargement    Cellulitis of groin, right    Cellulitis    Sepsis ([place]) 11/20/2019   Acute inguinal lymphadenitis    Vascular headache     Other chronic pain    Uncontrolled type 2 diabetes mellitus with hyperglycemia (HCC)    Labile blood glucose    Panic disorder with agoraphobia and moderate panic attacks    Major depressive disorder, recurrent episode, moderate (HCC)    MCI (mild cognitive impairment) with memory loss    Acute ischemic right middle cerebral artery (MCA) stroke ([place]) 03/09/2019   Left hemiparesis (HCC)    Bradycardia    Tachypnea    SIRS (systemic inflammatory response syndrome) (HCC)    Hypokalemia    Diabetes mellitus type 2 in obese ([place])    Tobacco abuse    History of breast cancer    Cognitive deficits    Dysphagia, post-stroke    Stroke (cerebrum) ([place]) 03/02/2019   Middle cerebral artery embolism, right 03/02/2019   Chest pain 12/19/2015   Numbness 12/19/2015   Femoral hernia 03/24/2014   Dyslipidemia 09/10/2011   BRCA1 positive 09/10/2011  Breast cancer ([place]) 09/10/2011   H/O gastric bypass; 05/14/11([facility]) 09/10/2011   Essential hypertension 10/28/2008   DEPRESSION/ANXIETY 02/19/2007   CARPAL TUNNEL SYNDROME, RIGHT 02/19/2007   ALLERGIC RHINITIS 02/19/2007   GERD 02/19/2007   IRRITABLE BOWEL SYNDROME 02/19/2007   HERPES GENITALIS 12/30/2006   Diabetes ([place]) 12/30/2006   OSTEOPENIA 12/30/2006   ANOMALY, CONGENITAL, NERVOUS SYSTEM NEC 12/30/2006         Psychiatric History:  No prior psychiatric history  Family Med/Psych History:  Family History  Problem Relation Age of Onset   Cancer Mother 22       breast cancer   Hypertension Sister    Hypothyroidism Sister    Cancer Sister    Hypothyroidism Brother    Cancer Maternal Grandmother 84       breast cancer   Cancer Maternal Grandfather 55       pancreatic cancer    Impression/DX:  LANISA ISHLER is a 51 year old female referred by Leeroy Cha, MD for neuropsychological consultation due to ongoing emotional distress after her stroke.  The patient is also had left-sided weakness with gait impairments but has been  improving.  The patient complains of issues not only having very flat/numb feeling state but also times when she is having a great deal of emotional distress, laughing at inappropriate times and being tearful.  The patient is also being treated for left shoulder adhesive meant capsulitis and is receiving injections and other therapies.  The patient had past issues with depression but feels that her depressive and emotional symptomatology are different than before.  The patient was hospitalized in November 2020 with sudden onset of left-sided weakness with neglect.  Patient with left-sided weakness as well as right gaze preference with CT scan showing a relatively large right-sided penumbra with right M1 segment occlusion.  Patient had emergent thrombectomy and required rescue ICA stenting and successful revascularization of the terminal right ICA, right ACA and MCA with TICI 2C reperfusion.  Disposition/Plan:  05/30/2021: Today was our first therapeutic intervention and we work specifically around understanding and managing one of her most challenging residual symptoms related to pseudobulbar affect.  The patient reports that it is very distressing to her and that she will laugh when trying to have serious discussions with her son or will have sudden onset of crying and tearing and various situations that do not match with what she has traditionally done or expected from her emotional response.  She reports that she may be crying and not feeling particularly sad or the situation will not warranted from her perception.  The patient reports that she is improving motor function and does have some residual motor weakness in her arm and leg but has made significant improvement around these areas.  Pseudobulbar affect continue to be the most problematic symptom that she is experiencing.  Diagnosis:    Spasticity as late effect of cerebrovascular accident (CVA)  History of ischemic right MCA stroke  Pseudobulbar  affect  Dysthymic disorder  Insomnia due to medical condition         Electronically Signed   _______________________ Ilean Skill, Psy.D. Clinical Neuropsychologist

## 2021-06-13 ENCOUNTER — Encounter: Payer: Medicare Other | Admitting: Psychology

## 2021-06-27 ENCOUNTER — Encounter: Payer: Medicare Other | Attending: Physical Medicine and Rehabilitation | Admitting: Psychology

## 2021-06-27 ENCOUNTER — Other Ambulatory Visit: Payer: Self-pay

## 2021-06-27 DIAGNOSIS — I69398 Other sequelae of cerebral infarction: Secondary | ICD-10-CM | POA: Diagnosis present

## 2021-06-27 DIAGNOSIS — Z8673 Personal history of transient ischemic attack (TIA), and cerebral infarction without residual deficits: Secondary | ICD-10-CM | POA: Insufficient documentation

## 2021-06-27 DIAGNOSIS — F482 Pseudobulbar affect: Secondary | ICD-10-CM | POA: Insufficient documentation

## 2021-06-27 DIAGNOSIS — R252 Cramp and spasm: Secondary | ICD-10-CM | POA: Insufficient documentation

## 2021-06-27 DIAGNOSIS — F341 Dysthymic disorder: Secondary | ICD-10-CM | POA: Insufficient documentation

## 2021-06-27 DIAGNOSIS — G4701 Insomnia due to medical condition: Secondary | ICD-10-CM | POA: Insufficient documentation

## 2021-07-05 ENCOUNTER — Encounter: Payer: Self-pay | Admitting: Psychology

## 2021-07-05 NOTE — Progress Notes (Signed)
?[facility] ?[address] ?[place]. [place] [address] ?[phone] ? ?     OFFICE FOLLOW-UP NOTE ? ?[name] ?Date of Birth:  Mar 23, 1971 ?Medical Record Number:  [id number]  ? ? ?Reason for visit: Stroke ? ? ?Chief complaint: ?Chief Complaint  ?Patient presents with  ? Follow-up  ?  Rm 3 alone ?Pt is well and stable, no concerns   ? ?  ? ? ?HPI:  ? ?Update 07/05/2021 JM: patient returns for stroke follow up after prior visit 7 months ago.  Overall stable without new stroke/TIA symptoms.  Currently being followed by Dr. Sima Matas for worsening depression post stroke and also on Nuedexta for pseudobulbar affect - she believe limited benefit with Nuedexta - she recently just restarted it about 2 weeks ago taking 1 cap nightly.  She believes her mood has been about the same but denies worsening although during visit, mood seems more upbeat and positive since prior visit. Continues to follow with PMR for left-sided spasticity poststroke - received 1 dose of botox in 03/2021 in left foot distally but denies much benefit, concerned that she felt more weakness since injection. She does not have a follow-up visit until May and does not believe she wants to repeat botox.  PCP referred to foot/ankle specialist and has initial evaluation next week. She has been previously seen by ortho for left shoulder injection for adhesive capsulitis with injection with great improvement but no recent visit. Still some limited shoulder movement.  ? ?Compliant on aspirin and Crestor, denies side effects. IR recommended discontinuing Brilinta but patient fearful of a recurrent stroke on just aspirin therefore recommended transitioning to Plavix back in September - she has not yet transitioned to Plavix and has been taking 1 tab of Brlinta daily as she still has an old prescription. It was recommend she have repeat carotid ultrasound around January but not yet completed (IR unable to reach patient, per patient she  keeps missing their calls and playing phone tag).  Blood pressure today 96/59. She does endorse at time symptomatic hypotension. Does admit to limited water/fluid intake - will purchase a large sweet tea which will last her the entire day. No further concerns at this time.  ? ? ? ? ?History provided for reference purposes only  ?Update 11/22/2020 JM: Ann Lopez returns for 81-monthstroke follow-up accompanied by her granddaughter.  Overall stable.  Residual limited movement of left shoulder.  Routinely follows with Dr. RRanell PatrickPMR for left shoulder adhesive capsulitis. Pain has improved since injection and SPRINT PNS and reports improved ROM but still some limitation especially when reaching behind her.  Tries to use left arm as much as she can but will find her self using her right arm more which has been causing some increased pain in right shoulder. Has f/u with Dr. RArchie Patten8/25.  She also struggles with "lack of emotion" which has been present since her stroke.  She has since self discontinued Cymbalta with concern this was contributing to symptoms but no improvement.  PCP recently referred her to neuropsych (unsure who or location as unable to view via epic) but doesn't have appt until October.  She does endorse longstanding history of depression and saw psychology before without benefit. She feels like these symptoms are different from her normal depression.  She is extremely frustrated as she feels like she just needs to cry but can't. Reports a good friend passed in May and even though she felt sadness, she was not able  to cry. Feels like she is "numb" to certain situations.  Otherwise, no other complaints and denies new stroke/TIA symptoms.  Compliant on aspirin and Crestor as well as Brilinta s/p stent -denies associated side effects.  Blood pressure today 97/63.  No further concerns at this time ? ?Update 05/23/2020 JM: Ann Lopez returns for stroke follow up unaccompanied. Stable since prior visit without new  stroke/TIA symptoms and reports residual left frozen shoulder but otherwise excellent improvement of left-sided weakness and gait.  She ambulates now without assistive device and denies any recent falls.  Completed OT as she met stated rehab goals with residual pain and reduced left shoulder ROM.  Followed by PMR Dr. Ranell Patrick for left shoulder adhesive capsulitis with plans on repeating steroid injection and/or nerve block at follow-up visit.  Remains on aspirin and Crestor for secondary stroke prevention of side effects.  Blood pressure today 114/79.  Glucose levels have greatly improved with reporting recent A1c 6.6 (unable to personally view via epic).  Remains on Brilinta per IR recommendations s/p stent with recent carotid duplex showing stable appearance -she is not sure of ongoing need of Brilinta or plans on repeat ultrasound.  She does admit to taking Brilinta once daily as she was experiencing excessive bruising on twice daily dosing.  No further concerns at this time. ? ?Update 11/18/2019 JM: Ann Lopez returns for stroke follow-up. ?Residual deficits left sided weakness with gait impairment  ?Currently participating in outpatient therapy with ongoing improvement but has been having difficulties with left frozen shoulder.  Shoulder has been slowly improving.  Recently started on baclofen by PCP ?Participated in [place] trial with excellent improvement of left arm strength ?Ambulates without assistive device and denies any recent falls ?Denies new or worsening stroke/TIA symptoms ?Continues on aspirin and Crestor for secondary stroke prevention without side effects.  Blood pressure today 106/71.  Glucose levels stable.  Continues to follow with PCP for HTN, HLD and DM management. ?Follow-up carotid ultrasound 09/18/2019 showed patent right ICA stent and recommended 30-month follow-up per Dr. Estanislado Pandy.  She remains on Brilinta with mild bruising but no bleeding -she is unsure if this needs to be  continued ?No further concerns ? ?Initial visit 05/18/2019 Dr. Leonie Man: Ann Lopez is a 51 year old Caucasian lady seen for initial office follow-up visit following hospital admission for stroke in November 2020.  History is obtained from the patient, review of electronic medical records and I personally reviewed imaging films in PACS.  She has past medical history of diabetes, hyperlipidemia, smoking, breast cancer who presented on 03/02/2019 with sudden onset of left-sided weakness with neglect.  Last seen normal was unclear possibly the night prior and she was not a TPA candidate.  On exam she had left sided weakness with right gaze preference and was taken for emergent CT scan which was unremarkable but CT perfusion showed a relatively large right-sided penumbra with right M1 occlusion.  She was taken for emergent thrombectomy and was found to have proximal right carotid stenosis which required rescue ICA stenting and there was successful revascularization of the terminal right ICA, right ACA and MCA with TICI 2C reperfusion by Dr. Estanislado Pandy.  She was kept in the intensive care unit her blood pressure is tightly controlled and she was extubated and did well.  She had left hemiparesis but gradually improved.  She was placed on aspirin and Brilinta and transferred to inpatient rehab where she stayed for a few weeks and is currently at home.  She still  getting therapy at home and is about to finish occupational therapy.  She has graduated from walking with a walker and now walks with a cane which she uses mostly for outdoors and indoors she can walk without it.  She is have pain with improvement in the left leg but her left arm is still significantly weak and she has trouble with bending her fingers and fine motor skills.  She is tolerating aspirin and Brilinta with minor bruising but no major bleeding.  She has followed up with Dr. Estanislado Pandy who plans to do a follow-up carotid ultrasound in May at next visit.   She has quit smoking completely and is proud of this fact.  She states her sugars are doing much better and fasting sugars are usually in the 1 10-1 20 range last A1c was 6.  Her lipids are also improving a

## 2021-07-05 NOTE — Progress Notes (Signed)
Neuropsychological Consultation ? ? ?Patient:   Ann Lopez  ? ?DOB:   [birth year] ? ?MR Number:  [id number] ? ?Location:  [facility] ?[place] [place] ?[id number] ?[place] [address] ?Dept: [phone] ?          ?Date of Service:   06/27/2021 ? ?Start Time:   11 AM ?End Time:   12 PM ? ?Today's visit was an in person visit that was conducted in outpatient clinic office.  Today we work specifically on therapeutic interventions.  The visit was 1 hour in duration. ? ?Provider/Observer:  Ilean Skill, Psy.D.   ?    Clinical Neuropsychologist ?     ? ?Billing Code/Service: 96158/96159 ? ?Chief Complaint:    Ann Lopez is a 51 year old female referred by Leeroy Cha, MD for neuropsychological consultation due to ongoing emotional distress after her stroke.  The patient is also had left-sided weakness with gait impairments but has been improving.  The patient complains of issues not only having very flat/numb feeling state but also times when she is having a great deal of emotional distress, laughing at inappropriate times and being tearful.  The patient is also being treated for left shoulder adhesive meant capsulitis and is receiving injections and other therapies.  The patient had past issues with depression but feels that her depressive and emotional symptomatology are different than before.  The patient was hospitalized in November 2020 with sudden onset of left-sided weakness with neglect.  Patient with left-sided weakness as well as right gaze preference with CT scan showing a relatively large right-sided penumbra with right M1 segment occlusion.  Patient had emergent thrombectomy and required rescue ICA stenting and successful revascularization of the terminal right ICA, right ACA and MCA with TICI 2C reperfusion. ? ?Reason for Service:  Ann Lopez is a 51 year old female referred by  Leeroy Cha, MD for neuropsychological consultation due to ongoing emotional distress after her stroke.  The patient is also had left-sided weakness with gait impairments but has been improving.  The patient complains of issues not only having very flat/numb feeling state but also times when she is having a great deal of emotional distress, laughing at inappropriate times and being tearful.  The patient is also being treated for left shoulder adhesive meant capsulitis and is receiving injections and other therapies.  The patient had past issues with depression but feels that her depressive and emotional symptomatology are different than before.  The patient was hospitalized in November 2020 with sudden onset of left-sided weakness with neglect.  Patient with left-sided weakness as well as right gaze preference with CT scan showing a relatively large right-sided penumbra with right M1 segment occlusion.  Patient had emergent thrombectomy and required rescue ICA stenting and successful revascularization of the terminal right ICA, right ACA and MCA with TICI 2C reperfusion. ? ?Patient describes significant changes in her mood state.  She reports that she now feels like things that used to upset her no longer do and that she can no longer cry.  The patient reports that she has been laughing in serious situations and this really bothers her.  The patient reports that she is always been very sympathetic person. ? ?The patient reports that her past medical history included diagnosis of breast cancer and her stroke is primary prior hospitalizations.  She reports that she does have some issues with memory but they are not severe.  She also reports that she does not have very good appetite.  She reports that she has insomnia but it is improved and at times she takes Restoril to help with her sleep pattern. ? ?Behavioral Observation: Ann Lopez  presents as a 51 y.o.-year-old Right handed Caucasian Female who appeared  her stated age. her dress was Appropriate and she was Well Groomed and her manners were Appropriate to the situation.  her participation was indicative of Appropriate behaviors.  There were physical disabilities noted.  she displayed an appropriate level of cooperation and motivation.   ? ? ?Interactions:    Active Appropriate ? ?Attention:   within normal limits and attention span and concentration were age appropriate ? ?Memory:   within normal limits; recent and remote memory intact ? ?Visuo-spatial:  not examined ? ?Speech (Volume):  normal ? ?Speech:   normal; normal ? ?Thought Process:  Coherent and Relevant ? ?Though Content:  WNL; not suicidal and not homicidal ? ?Orientation:   person, place, time/date, and situation ? ?Judgment:   Good ? ?Planning:   Good ? ?Affect:    Appropriate ? ?Mood:    Dysphoric ? ?Insight:   Good ? ?Intelligence:   normal ? ?Marital Status/Living: The patient was born and raised in [place] along with 4 siblings.  The patient lives with her boyfriend.  The patient has 57 female children ages 74, 59, 26 and 65.  They are all in good mental and medical health. ? ?Current Employment: Currently working. ? 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 ? ?Substance Use:  No concerns of substance abuse are reported.  The patient admits to some tobacco use. ? ?Education:   The patient completed her GED and has taken some college courses. ? ?Medical History:   ?Past Medical History:  ?Diagnosis Date  ? Allergy   ? Anxiety   ? Asthma   ? Back pain   ? Breast cancer ([place])   ? Cancer [facility])   ? Depression   ? Diabetes mellitus   ? Headache   ? Hyperlipemia   ? Osteopenia   ? Osteoporosis   ? osteopenia  ? Stroke [facility])   ? ? ?     ?Patient Active Problem List  ? Diagnosis Date Noted  ? Lymph node enlargement   ? Cellulitis of groin, right   ? Cellulitis   ? Sepsis ([place]) 11/20/2019  ? Acute inguinal lymphadenitis   ? Vascular headache   ?  Other chronic pain   ? Uncontrolled type 2 diabetes mellitus with hyperglycemia ([place])   ? Labile blood glucose   ? Panic disorder with agoraphobia and moderate panic attacks   ? Major depressive disorder, recurrent episode, moderate ([place])   ? MCI (mild cognitive impairment) with memory loss   ? Acute ischemic right middle cerebral artery (MCA) stroke ([place]) 03/09/2019  ? Left hemiparesis ([place])   ? Bradycardia   ? Tachypnea   ? SIRS (systemic inflammatory response syndrome) ([facility])   ? Hypokalemia   ? Diabetes mellitus type 2 in obese [facility])   ? Tobacco abuse   ? History of breast cancer   ? Cognitive deficits   ? Dysphagia, post-stroke   ? Stroke (cerebrum) ([place]) 03/02/2019  ? Middle cerebral artery embolism, right 03/02/2019  ? Chest pain 12/19/2015  ? Numbness 12/19/2015  ? Femoral hernia 03/24/2014  ? Dyslipidemia 09/10/2011  ? BRCA1 positive 09/10/2011  ?  Breast cancer ([place]) 09/10/2011  ? H/O gastric bypass; 05/14/11([facility]) 09/10/2011  ? Essential hypertension 10/28/2008  ? DEPRESSION/ANXIETY 02/19/2007  ? CARPAL TUNNEL SYNDROME, RIGHT 02/19/2007  ? ALLERGIC RHINITIS 02/19/2007  ? GERD 02/19/2007  ? IRRITABLE BOWEL SYNDROME 02/19/2007  ? HERPES GENITALIS 12/30/2006  ? Diabetes ([place]) 12/30/2006  ? OSTEOPENIA 12/30/2006  ? ANOMALY, CONGENITAL, NERVOUS SYSTEM NEC 12/30/2006  ? ?     ? ?Psychiatric History:  No prior psychiatric history ? ?Family Med/Psych History:  ?Family History  ?Problem Relation Age of Onset  ? Cancer Mother 78  ?     breast cancer  ? Hypertension Sister   ? Hypothyroidism Sister   ? Cancer Sister   ? Hypothyroidism Brother   ? Cancer Maternal Grandmother 74  ?     breast cancer  ? Cancer Maternal Grandfather 57  ?     pancreatic cancer  ? ? ?Impression/DX:  Ann Lopez is a 51 year old female referred by Leeroy Cha, MD for neuropsychological consultation due to ongoing emotional distress after her stroke.  The patient is also had left-sided weakness with gait impairments but has been  improving.  The patient complains of issues not only having very flat/numb feeling state but also times when she is having a great deal of emotional distress, laughing at inappropriate times and being tear

## 2021-07-06 ENCOUNTER — Ambulatory Visit (INDEPENDENT_AMBULATORY_CARE_PROVIDER_SITE_OTHER): Payer: Medicare Other | Admitting: Adult Health

## 2021-07-06 ENCOUNTER — Encounter: Payer: Self-pay | Admitting: Adult Health

## 2021-07-06 VITALS — BP 96/59 | HR 62 | Ht 62.0 in | Wt 142.0 lb

## 2021-07-06 DIAGNOSIS — I6521 Occlusion and stenosis of right carotid artery: Secondary | ICD-10-CM

## 2021-07-06 DIAGNOSIS — Z8673 Personal history of transient ischemic attack (TIA), and cerebral infarction without residual deficits: Secondary | ICD-10-CM | POA: Diagnosis not present

## 2021-07-06 NOTE — Patient Instructions (Addendum)
Continue to follow with Dr. Sima Matas and physical medicine and rehab ? ?Continue aspirin 81 mg daily  and Crestor for secondary stroke prevention ? ?Please contact Dr. Arlean Hopping office to schedule follow-up carotid ultrasound and discuss ongoing need of Plavix. You can contact them at [phone]. Ongoing need of plavix not indicated from a stroke standpoint.  ? ?Continue to follow up with PCP regarding cholesterol and blood pressure management  ?Maintain strict control of hypertension with blood pressure goal below 130/90 and cholesterol with LDL cholesterol (bad cholesterol) goal below 70 mg/dL.  ? ?Signs of a Stroke? Follow the BEFAST method:  ?Balance Watch for a sudden loss of balance, trouble with coordination or vertigo ?Eyes Is there a sudden loss of vision in one or both eyes? Or double vision?  ?Face: Ask the person to smile. Does one side of the face droop or is it numb?  ?Arms: Ask the person to raise both arms. Does one arm drift downward? Is there weakness or numbness of a leg? ?Speech: Ask the person to repeat a simple phrase. Does the speech sound slurred/strange? Is the person confused ? ?Time: If you observe any of these signs, call 911. ? ? ? ? ? ? ?Thank you for coming to see [place] at [facility]. I hope we have been able to provide you high quality care today. ? ?You may receive a patient satisfaction survey over the next few weeks. We would appreciate your feedback and comments so that we may continue to improve ourselves and the health of our patients. ? ?

## 2021-07-11 ENCOUNTER — Encounter: Payer: Medicare Other | Admitting: Psychology

## 2021-07-26 ENCOUNTER — Encounter: Payer: Self-pay | Admitting: Adult Health

## 2021-07-27 ENCOUNTER — Encounter: Payer: Self-pay | Admitting: Physical Medicine and Rehabilitation

## 2021-08-14 ENCOUNTER — Other Ambulatory Visit (HOSPITAL_COMMUNITY): Payer: Self-pay | Admitting: Interventional Radiology

## 2021-08-14 DIAGNOSIS — I639 Cerebral infarction, unspecified: Secondary | ICD-10-CM

## 2021-08-15 ENCOUNTER — Encounter
Payer: Medicare Other | Attending: Physical Medicine and Rehabilitation | Admitting: Physical Medicine and Rehabilitation

## 2021-08-15 ENCOUNTER — Encounter: Payer: Self-pay | Admitting: Physical Medicine and Rehabilitation

## 2021-08-15 ENCOUNTER — Ambulatory Visit: Payer: Medicare Other | Admitting: Physical Medicine and Rehabilitation

## 2021-08-15 VITALS — BP 95/63 | HR 72 | Temp 98.3°F | Ht 62.0 in | Wt 138.0 lb

## 2021-08-15 DIAGNOSIS — R252 Cramp and spasm: Secondary | ICD-10-CM

## 2021-08-15 DIAGNOSIS — I69398 Other sequelae of cerebral infarction: Secondary | ICD-10-CM | POA: Diagnosis not present

## 2021-08-15 NOTE — Patient Instructions (Signed)
-  provided a link to a pdf of Pete Escogue's musculoskeletal alignment exercises: [email or web address]  ?

## 2021-08-15 NOTE — Progress Notes (Signed)
Botox Injection for spasticity using US guidance ? ?Indication: Severe spasticity which interferes with ADL,mobility and/or  hygiene and is unresponsive to medication management and other conservative care ?Informed consent was obtained after describing risks and benefits of the procedure with the patient. This includes bleeding, bruising, infection, excessive weakness, or medication side effects. A REMS form is on file and signed. ? ?100U in left lower extremity flexor digitorum longus ? ?All injections were done after obtaining appropriate [place] visualization and after negative drawback for blood. The patient tolerated the procedure well. Post procedure instructions were given. A followup appointment was made.   ?

## 2021-08-22 ENCOUNTER — Ambulatory Visit (HOSPITAL_COMMUNITY)
Admission: RE | Admit: 2021-08-22 | Discharge: 2021-08-22 | Disposition: A | Discharge status: Home / Self Care | Payer: Medicare Other | Source: Ambulatory Visit | Attending: Interventional Radiology | Admitting: Interventional Radiology

## 2021-08-22 DIAGNOSIS — I639 Cerebral infarction, unspecified: Secondary | ICD-10-CM | POA: Diagnosis present

## 2021-08-22 NOTE — Progress Notes (Signed)
Carotid artery duplex completed. ?Refer to "CV Proc" under chart review to view preliminary results. ? ?08/22/2021 9:34 AM ?Kelby Aline., MHA, RVT, RDCS, RDMS   ?

## 2021-08-31 ENCOUNTER — Telehealth (HOSPITAL_COMMUNITY): Payer: Self-pay

## 2021-08-31 NOTE — Telephone Encounter (Signed)
Pt agreed to f/u in 6 months with us carotid. AW 

## 2021-09-21 ENCOUNTER — Ambulatory Visit: Payer: Medicare Other | Admitting: Physical Medicine and Rehabilitation

## 2021-09-26 ENCOUNTER — Encounter: Payer: Medicare Other | Admitting: Psychology

## 2021-10-30 ENCOUNTER — Encounter: Payer: Self-pay | Admitting: Physical Medicine and Rehabilitation

## 2021-10-30 ENCOUNTER — Other Ambulatory Visit: Payer: Self-pay | Admitting: Physical Medicine and Rehabilitation

## 2021-10-30 DIAGNOSIS — R252 Cramp and spasm: Secondary | ICD-10-CM

## 2021-11-02 ENCOUNTER — Ambulatory Visit: Payer: Medicare Other | Attending: Physical Medicine and Rehabilitation | Admitting: Physical Therapy

## 2021-11-02 DIAGNOSIS — I69398 Other sequelae of cerebral infarction: Secondary | ICD-10-CM | POA: Diagnosis not present

## 2021-11-02 DIAGNOSIS — Z9181 History of falling: Secondary | ICD-10-CM | POA: Diagnosis present

## 2021-11-02 DIAGNOSIS — R2681 Unsteadiness on feet: Secondary | ICD-10-CM | POA: Diagnosis not present

## 2021-11-02 DIAGNOSIS — R2689 Other abnormalities of gait and mobility: Secondary | ICD-10-CM | POA: Diagnosis present

## 2021-11-02 DIAGNOSIS — R252 Cramp and spasm: Secondary | ICD-10-CM | POA: Diagnosis not present

## 2021-11-02 NOTE — Therapy (Signed)
OUTPATIENT PHYSICAL THERAPY NEURO EVALUATION   Patient Name: Ann Lopez MRN: [id number] DOB:1970/12/13, 51 y.o., female Today's Date: 11/02/2021   PCP: Hayden Rasmussen, MD REFERRING PROVIDER: Izora Ribas, MD    PT End of Session - 11/02/21 0809     Visit Number 1    Number of Visits 9   Plus eval   Date for PT Re-Evaluation 01/04/22    Authorization Type UHC Medicare/Medicaid    PT Start Time [phone]   Pt arrived late   PT Stop Time 0852    PT Time Calculation (min) 45 min    Activity Tolerance Patient tolerated treatment well    Behavior During Therapy WFL for tasks assessed/performed             Past Medical History:  Diagnosis Date   Allergy    Anxiety    Asthma    Back pain    Breast cancer ([place])    Cancer ([place])    Depression    Diabetes mellitus    Headache    Hyperlipemia    Osteopenia    Osteoporosis    osteopenia   Stroke [facility])    Past Surgical History:  Procedure Laterality Date   ABDOMINAL HYSTERECTOMY     B oophorectomy for BRCA1 gene   BREAST SURGERY     CESAREAN SECTION     INCISION AND DRAINAGE ABSCESS Left 11/14/2012   Procedure: INCISION AND DRAINAGE ABSCESS LEFT GROIN;  Surgeon: Jamesetta So, MD;  Location: [facility];  Service: General;  Laterality: Left;   INGUINAL LYMPH NODE BIOPSY Right 11/23/2019   Procedure: INGUINAL LYMPH NODE BIOPSY;  Surgeon: Aviva Signs, MD;  Location: [facility];  Service: General;  Laterality: Right;   IR ANGIO VERTEBRAL SEL SUBCLAVIAN INNOMINATE UNI R MOD SED  03/02/2019   IR CT HEAD LTD  03/02/2019   IR INTRAVSC STENT CERV CAROTID W/O EMB-PROT MOD SED INC ANGIO  03/02/2019   IR PERCUTANEOUS ART THROMBECTOMY/INFUSION INTRACRANIAL INC DIAG ANGIO  03/02/2019   LYMPHADENECTOMY     MASTECTOMY Bilateral    RADIOLOGY WITH ANESTHESIA N/A 03/02/2019   Procedure: IR WITH ANESTHESIA;  Surgeon: Luanne Bras, MD;  Location: [facility];  Service: Radiology;  Laterality: N/A;   Patient Active Problem List    Diagnosis Date Noted   Lymph node enlargement    Cellulitis of groin, right    Cellulitis    Sepsis ([place]) 11/20/2019   Acute inguinal lymphadenitis    Vascular headache    Other chronic pain    Uncontrolled type 2 diabetes mellitus with hyperglycemia (HCC)    Labile blood glucose    Panic disorder with agoraphobia and moderate panic attacks    Major depressive disorder, recurrent episode, moderate (HCC)    MCI (mild cognitive impairment) with memory loss    Acute ischemic right middle cerebral artery (MCA) stroke ([place]) 03/09/2019   Left hemiparesis (HCC)    Bradycardia    Tachypnea    SIRS (systemic inflammatory response syndrome) (HCC)    Hypokalemia    Diabetes mellitus type 2 in obese ([place])    Tobacco abuse    History of breast cancer    Cognitive deficits    Dysphagia, post-stroke    Stroke (cerebrum) ([place]) 03/02/2019   Middle cerebral artery embolism, right 03/02/2019   Chest pain 12/19/2015   Numbness 12/19/2015   Femoral hernia 03/24/2014   Dyslipidemia 09/10/2011   BRCA1 positive 09/10/2011   Breast cancer ([place]) 09/10/2011  H/O gastric bypass; 05/14/11([facility]) 09/10/2011   Essential hypertension 10/28/2008   DEPRESSION/ANXIETY 02/19/2007   CARPAL TUNNEL SYNDROME, RIGHT 02/19/2007   ALLERGIC RHINITIS 02/19/2007   GERD 02/19/2007   IRRITABLE BOWEL SYNDROME 02/19/2007   HERPES GENITALIS 12/30/2006   Diabetes ([place]) 12/30/2006   OSTEOPENIA 12/30/2006   ANOMALY, CONGENITAL, NERVOUS SYSTEM NEC 12/30/2006    ONSET DATE: 10/30/2021 (referral)  REFERRING DIAG: Y40.347,Q25.9 (ICD-10-CM) - Spasticity as late effect of cerebrovascular accident (CVA)   THERAPY DIAG:  Unsteadiness on feet  Other abnormalities of gait and mobility  History of falling  Rationale for Evaluation and Treatment Rehabilitation  SUBJECTIVE:                                                                                                                                                                                               SUBJECTIVE STATEMENT: "I've got different issues in my L foot. My toes are all curled up and I think that is what is causing my foot to turn and me to fall". Pt reported the spasticity started about a year ago but has gotten significantly worse in past 49mo   Pt accompanied by: self  PERTINENT HISTORY: right MCA infarct in November 2020 secondary to terminal right ICA, MCA and ACA occlusion status post mechanical thrombectomy and rescue right proximal ICA stent placement with residual left sided spasticity and mood disorder with pseudobulbar affect.  Vascular risk factors of diabetes, hyperlipidemia, smoking and carotid stenosis. Osteopenia   PAIN:  Are you having pain? No  PRECAUTIONS: Fall  WEIGHT BEARING RESTRICTIONS No  FALLS: Has patient fallen in last 6 months? Yes. Number of falls multiple, always occurs during walking. Most recent fall occurred a week ago. Pt reports she can often catch herself   LIVING ENVIRONMENT: Lives with: lives with their family and lives with their son Lives in: Mobile home Stairs: Yes: External: 4 steps; bilateral but cannot reach both Has following equipment at home: QProgrammer, multimedia WEnvironmental consultant- 2 wheeled, Wheelchair (manual), and Grab bars  PLOF: Independent with basic ADLs and requires assistance carrying groceries into her home, taking out trash, moving garbage cans, etc.   PATIENT GOALS "To get my walking back"  OBJECTIVE:   COGNITION: Overall cognitive status: Within functional limits for tasks assessed   COORDINATION: Heel to shin test: WFL bilaterally   EDEMA: Mild edema noted in LLE, pt reports swelling is worse w/weightbearing    MUSCLE TONE: LLE: spastic, flexor tone at rest and w/gait (supination, PF)   POSTURE: rounded shoulders and forward head. Flexor tone noted on L hemibody    LOWER  EXTREMITY MMT:  Tested in seated position  MMT Right Eval Left Eval  Hip flexion 5 4+  Hip  extension    Hip abduction 5 4+  Hip adduction 5 4+  Hip internal rotation    Hip external rotation    Knee flexion 5 4+  Knee extension 5 4+  Ankle dorsiflexion 5 4-  Ankle plantarflexion    Ankle inversion    Ankle eversion    (Blank rows = not tested)  BED MOBILITY:  Independent per pt  TRANSFERS: Assistive device utilized: None  Sit to stand: Complete Independence Stand to sit: Complete Independence  GAIT: Gait pattern:  flexor tone of LLE (supination, adduction, PF), step through pattern, decreased step length- Right, decreased stance time- Left, decreased ankle dorsiflexion- Left, scissoring, and poor foot clearance- Left Distance walked: Various clinic distances  Assistive device utilized: None Level of assistance: Modified independence Comments: Noted scissoring of LLE and occasional catching of L foot on R heel   FUNCTIONAL TESTs:   [facility] PT Assessment - 11/02/21 0838       Transfers   Five time sit to stand comments  10.22s without UE support      Ambulation/Gait   Gait velocity 32.8' over 6.44s = 5.09 ft/s with no AD              PATIENT SURVEYS:  FOTO 54  TODAY'S TREATMENT:  Next session   PATIENT EDUCATION: Education details: eval findings, POC, informed pt to bring in old AFO and foot-up brace next session Person educated: Patient Education method: Explanation and Demonstration Education comprehension: verbalized understanding   HOME EXERCISE PROGRAM: To be established next session     GOALS: Goals reviewed with patient? Yes  SHORT TERM GOALS: Target date: 11/30/2021  Pt will be independent with initial HEP for improved ankle strength, balance, transfers and gait.  Baseline: not established on eval Goal status: INITIAL  2.  Pt will trial various orthotics/braces to facilitate improved pronation of L foot and reduce excessive supination.  Baseline:  Goal status: INITIAL   LONG TERM GOALS: Target date: 12/28/2021  Pt will be  independent with final HEP for improved strength, balance, transfers and gait.  Baseline:  Goal status: INITIAL  2.  Berg to be assessed and goal written Baseline:  Goal status: INITIAL  3.  Pt will improve FOTO to 55 for improved functional mobility  Baseline: 54 Goal status: INITIAL  4.  MCTSIB to be assessed and goal written Baseline:  Goal status: INITIAL  5.  Pt will report 2 or fewer falls since evaluation for reduced fall risk and improved safety w/mobility  Baseline: >20 falls in past few months  Goal status: INITIAL  ASSESSMENT:  CLINICAL IMPRESSION: Patient is a 51 year old female referred to Neuro OPPT for CVA.   Pt's PMH is significant for: right MCA infarct in November 2020 secondary to terminal right ICA, MCA and ACA occlusion status post mechanical thrombectomy and rescue right proximal ICA stent placement with residual left sided spasticity and mood disorder with pseudobulbar affect.  Vascular risk factors of diabetes, hyperlipidemia, smoking and carotid stenosis. The following deficits were present during the exam: flexor tone of LLE, decreased balance and fear-avoidance behavior. Based on fall history and increased spasticity of L foot, pt is an incr risk for falls. Balance to be further assessed next session. Pt would benefit from skilled PT to address these impairments and functional limitations to maximize functional mobility independence.   OBJECTIVE IMPAIRMENTS  Abnormal gait, decreased balance, decreased coordination, decreased mobility, difficulty walking, and impaired tone.   ACTIVITY LIMITATIONS carrying, lifting, stairs, transfers, reach over head, locomotion level, and caring for others  PARTICIPATION LIMITATIONS: meal prep, cleaning, laundry, shopping, community activity, and yard work  [place], Past/current experiences, Time since onset of injury/illness/exacerbation, and 1 comorbidity: LLE spasticity  are also affecting patient's  functional outcome.   REHAB POTENTIAL: Good  CLINICAL DECISION MAKING: Stable/uncomplicated  EVALUATION COMPLEXITY: Low  PLAN: PT FREQUENCY: 1x/week  PT DURATION: 8 weeks  PLANNED INTERVENTIONS: Therapeutic exercises, Therapeutic activity, Neuromuscular re-education, Balance training, Gait training, Patient/Family education, Self Care, Orthotic/Fit training, Electrical stimulation, Manual therapy, and Re-evaluation  PLAN FOR NEXT SESSION: Berg, MCTSIB, establish HEP for ankle strength and mobility, dynamic balance, trial AFOs/Foot-up/lateral ankle braces    Angus Amini E Tevyn Codd, PT, DPT 11/02/2021, 8:55 AM

## 2021-11-09 ENCOUNTER — Ambulatory Visit: Payer: Medicare Other | Attending: Physical Medicine and Rehabilitation | Admitting: Physical Therapy

## 2021-11-09 DIAGNOSIS — R2689 Other abnormalities of gait and mobility: Secondary | ICD-10-CM | POA: Diagnosis present

## 2021-11-09 DIAGNOSIS — R2681 Unsteadiness on feet: Secondary | ICD-10-CM | POA: Insufficient documentation

## 2021-11-09 DIAGNOSIS — M6281 Muscle weakness (generalized): Secondary | ICD-10-CM | POA: Diagnosis present

## 2021-11-09 NOTE — Therapy (Signed)
OUTPATIENT PHYSICAL THERAPY NEURO TREATMENT   Patient Name: Ann Lopez MRN: [id number] DOB:July 10, 1970, 51 y.o., female Today's Date: 11/09/2021   PCP: Hayden Rasmussen, MD REFERRING PROVIDER: Izora Ribas, MD    PT End of Session - 11/09/21 1450     Visit Number 2    Number of Visits 9   Plus eval   Date for PT Re-Evaluation 01/04/22    Authorization Type UHC Medicare/Medicaid    PT Start Time 1449    PT Stop Time 1530    PT Time Calculation (min) 41 min    Activity Tolerance Patient tolerated treatment well    Behavior During Therapy WFL for tasks assessed/performed              Past Medical History:  Diagnosis Date   Allergy    Anxiety    Asthma    Back pain    Breast cancer ([place])    Cancer ([place])    Depression    Diabetes mellitus    Headache    Hyperlipemia    Osteopenia    Osteoporosis    osteopenia   Stroke [facility])    Past Surgical History:  Procedure Laterality Date   ABDOMINAL HYSTERECTOMY     B oophorectomy for BRCA1 gene   BREAST SURGERY     CESAREAN SECTION     INCISION AND DRAINAGE ABSCESS Left 11/14/2012   Procedure: INCISION AND DRAINAGE ABSCESS LEFT GROIN;  Surgeon: Jamesetta So, MD;  Location: [facility];  Service: General;  Laterality: Left;   INGUINAL LYMPH NODE BIOPSY Right 11/23/2019   Procedure: INGUINAL LYMPH NODE BIOPSY;  Surgeon: Aviva Signs, MD;  Location: [facility];  Service: General;  Laterality: Right;   IR ANGIO VERTEBRAL SEL SUBCLAVIAN INNOMINATE UNI R MOD SED  03/02/2019   IR CT HEAD LTD  03/02/2019   IR INTRAVSC STENT CERV CAROTID W/O EMB-PROT MOD SED INC ANGIO  03/02/2019   IR PERCUTANEOUS ART THROMBECTOMY/INFUSION INTRACRANIAL INC DIAG ANGIO  03/02/2019   LYMPHADENECTOMY     MASTECTOMY Bilateral    RADIOLOGY WITH ANESTHESIA N/A 03/02/2019   Procedure: IR WITH ANESTHESIA;  Surgeon: Luanne Bras, MD;  Location: [facility];  Service: Radiology;  Laterality: N/A;   Patient Active Problem List   Diagnosis Date Noted    Lymph node enlargement    Cellulitis of groin, right    Cellulitis    Sepsis ([place]) 11/20/2019   Acute inguinal lymphadenitis    Vascular headache    Other chronic pain    Uncontrolled type 2 diabetes mellitus with hyperglycemia (HCC)    Labile blood glucose    Panic disorder with agoraphobia and moderate panic attacks    Major depressive disorder, recurrent episode, moderate (HCC)    MCI (mild cognitive impairment) with memory loss    Acute ischemic right middle cerebral artery (MCA) stroke ([place]) 03/09/2019   Left hemiparesis (HCC)    Bradycardia    Tachypnea    SIRS (systemic inflammatory response syndrome) (HCC)    Hypokalemia    Diabetes mellitus type 2 in obese ([place])    Tobacco abuse    History of breast cancer    Cognitive deficits    Dysphagia, post-stroke    Stroke (cerebrum) ([place]) 03/02/2019   Middle cerebral artery embolism, right 03/02/2019   Chest pain 12/19/2015   Numbness 12/19/2015   Femoral hernia 03/24/2014   Dyslipidemia 09/10/2011   BRCA1 positive 09/10/2011   Breast cancer ([place]) 09/10/2011   H/O gastric bypass;  05/14/11([facility]) 09/10/2011   Essential hypertension 10/28/2008   DEPRESSION/ANXIETY 02/19/2007   CARPAL TUNNEL SYNDROME, RIGHT 02/19/2007   ALLERGIC RHINITIS 02/19/2007   GERD 02/19/2007   IRRITABLE BOWEL SYNDROME 02/19/2007   HERPES GENITALIS 12/30/2006   Diabetes ([place]) 12/30/2006   OSTEOPENIA 12/30/2006   ANOMALY, CONGENITAL, NERVOUS SYSTEM NEC 12/30/2006    ONSET DATE: 10/30/2021 (referral)  REFERRING DIAG: O71.219,X58.8 (ICD-10-CM) - Spasticity as late effect of cerebrovascular accident (CVA)   THERAPY DIAG:  Unsteadiness on feet  Other abnormalities of gait and mobility  Muscle weakness (generalized)  Rationale for Evaluation and Treatment Rehabilitation  SUBJECTIVE:                                                                                                                                                                                               SUBJECTIVE STATEMENT: Pt reports having "near falls" daily due to L foot inverting. No complaints of pain. Pt reports she no longer has issues with drop foot just with L toes curling and inversion with standing and gait.  Pt accompanied by: self  PERTINENT HISTORY: right MCA infarct in November 2020 secondary to terminal right ICA, MCA and ACA occlusion status post mechanical thrombectomy and rescue right proximal ICA stent placement with residual left sided spasticity and mood disorder with pseudobulbar affect.  Vascular risk factors of diabetes, hyperlipidemia, smoking and carotid stenosis. Osteopenia   PAIN:  Are you having pain? No  PRECAUTIONS: Fall  FALLS: Has patient fallen in last 6 months? Yes. Number of falls multiple, always occurs during walking. Most recent fall occurred a week ago. Pt reports she can often catch herself    PATIENT GOALS "To get my walking back"  OBJECTIVE:    TODAY'S TREATMENT:  GAIT:  Gait pattern:  flexor tone of LLE (supination, adduction, PF), step through pattern, decreased step length- Right, decreased stance time- Left, decreased ankle dorsiflexion- Left, scissoring, and poor foot clearance- Left Distance walked: Various clinic distances  Assistive device utilized: None Level of assistance: Modified independence Comments: Noted scissoring of LLE and occasional catching of L foot on R heel    THER ACTMerrilee Jansky  [facility] PT Assessment - 11/09/21 1454       Standardized Balance Assessment   Standardized Balance Assessment Berg Balance Test      Berg Balance Test   Sit to Stand Able to stand without using hands and stabilize independently    Standing Unsupported Able to stand safely 2 minutes    Sitting with Back Unsupported but Feet Supported on Floor or Stool Able to sit safely and securely 2 minutes  Stand to Sit Sits safely with minimal use of hands    Transfers Able to transfer safely, minor use of hands    Standing  Unsupported with Eyes Closed Able to stand 10 seconds safely    Standing Unsupported with Feet Together Able to place feet together independently and stand 1 minute safely    From Standing, Reach Forward with Outstretched Arm Can reach forward >12 cm safely (5")    From Standing Position, Pick up Object from Floor Able to pick up shoe safely and easily    From Standing Position, Turn to Look Behind Over each Shoulder Looks behind from both sides and weight shifts well    Turn 360 Degrees Able to turn 360 degrees safely in 4 seconds or less    Standing Unsupported, Alternately Place Feet on Step/Stool Able to stand independently and complete 8 steps >20 seconds    Standing Unsupported, One Foot in Front Able to place foot tandem independently and hold 30 seconds    Standing on One Leg Able to lift leg independently and hold equal to or more than 3 seconds    Total Score 52            MCTSIB: 107/120 Most difficulty maintaining balance with EC on foam surface    THER EX: Initiated HEP, see below   PATIENT EDUCATION: Education details: initiated HEP, informed pt to bring in old AFO and foot-up brace next session Person educated: Patient Education method: Explanation, Demonstration, and Handouts Education comprehension: verbalized understanding   HOME EXERCISE PROGRAM: Access Code: [email or web address] URL: [email or web address] Date: 11/09/2021 Prepared by: Excell Seltzer  Exercises - Towel Scrunches  - 1 x daily - 7 x weekly - 3 sets - 10 reps - Long Sitting Ankle Plantar Flexion with Resistance  - 1 x daily - 7 x weekly - 1 sets - 5 reps - 30 hold - Ankle Inversion Eversion PROM in Dorsiflexion  - 1 x daily - 7 x weekly - 1 sets - 5 reps - 30 hold    GOALS: Goals reviewed with patient? Yes  SHORT TERM GOALS: Target date: 11/30/2021  Pt will be independent with initial HEP for improved ankle strength, balance, transfers and gait.  Baseline: not established on  eval Goal status: INITIAL  2.  Pt will trial various orthotics/braces to facilitate improved pronation of L foot and reduce excessive supination.  Baseline:  Goal status: INITIAL   LONG TERM GOALS: Target date: 01/04/2022  Pt will be independent with final HEP for improved strength, balance, transfers and gait.  Baseline:  Goal status: INITIAL  2.  Pt will improve Berg score to 56/56 for decreased fall risk  Baseline: 52 (8/3) Goal status: INITIAL  3.  Pt will improve FOTO to 55 for improved functional mobility  Baseline: 54 Goal status: INITIAL  4.  Pt to score 120/120 on MCTSIB tfor improved balance and decreased fall risk Baseline: 107/120 (8/3) Goal status: INITIAL  5.  Pt will report 2 or fewer falls since evaluation for reduced fall risk and improved safety w/mobility  Baseline: >20 falls in past few months  Goal status: INITIAL  ASSESSMENT:  CLINICAL IMPRESSION: Emphasis of skilled PT session on assessing balance with objective measures and initiating L ankle strength and ROM HEP. Pt scores 52/56 on Berg and 107/120 on the MCTSIB, indicating a low fall risk but does exhibit deficits in some areas including stance on foam with EC, tandem stance, and SLS. Provided pt with HEP to encourage  eversion ROM and for strengthening. Encouraged pt bring in her foot-up brace and AFO to next PT appointment so that the braces she currently has can be assessed and it can be determine if she would benefit from another style of brace. Pt remains adamant she will not wear her AFO as she no longer believes she has foot drop and perseverates on inversion of her foot. Again encouraged pt to bring in braces for assessment. Continue POC.   OBJECTIVE IMPAIRMENTS Abnormal gait, decreased balance, decreased coordination, decreased mobility, difficulty walking, and impaired tone.   ACTIVITY LIMITATIONS carrying, lifting, stairs, transfers, reach over head, locomotion level, and caring for  others  PARTICIPATION LIMITATIONS: meal prep, cleaning, laundry, shopping, community activity, and yard work  [place], Past/current experiences, Time since onset of injury/illness/exacerbation, and 1 comorbidity: LLE spasticity  are also affecting patient's functional outcome.   REHAB POTENTIAL: Good  CLINICAL DECISION MAKING: Stable/uncomplicated  EVALUATION COMPLEXITY: Low  PLAN: PT FREQUENCY: 1x/week  PT DURATION: 8 weeks  PLANNED INTERVENTIONS: Therapeutic exercises, Therapeutic activity, Neuromuscular re-education, Balance training, Gait training, Patient/Family education, Self Care, Orthotic/Fit training, Electrical stimulation, Manual therapy, and Re-evaluation  PLAN FOR NEXT SESSION: HEP for ankle strength and mobility, dynamic balance, trial AFOs/Foot-up/lateral ankle braces (see if pt brings in her foot up brace and AFO, otherwise assess with samples), tandem stance, SLS, stance on foam with eyes closed, rocker board, dynadisc     Excell Seltzer, PT, DPT, CSRS 11/09/2021, 3:34 PM

## 2021-11-14 ENCOUNTER — Ambulatory Visit: Payer: Medicare Other | Admitting: Physical Therapy

## 2021-11-16 ENCOUNTER — Encounter: Payer: Self-pay | Admitting: Physical Medicine and Rehabilitation

## 2021-11-16 ENCOUNTER — Encounter
Payer: Medicare Other | Attending: Physical Medicine and Rehabilitation | Admitting: Physical Medicine and Rehabilitation

## 2021-11-16 VITALS — BP 95/60 | HR 75 | Ht 62.0 in | Wt 141.0 lb

## 2021-11-16 DIAGNOSIS — R252 Cramp and spasm: Secondary | ICD-10-CM

## 2021-11-16 DIAGNOSIS — I69398 Other sequelae of cerebral infarction: Secondary | ICD-10-CM | POA: Diagnosis present

## 2021-11-16 NOTE — Progress Notes (Addendum)
Botox Injection for spasticity using US guidance  Indication: Severe spasticity which interferes with ADL,mobility and/or  hygiene and is unresponsive to medication management and other conservative care Informed consent was obtained after describing risks and benefits of the procedure with the patient. This includes bleeding, bruising, infection, excessive weakness, or medication side effects. A REMS form is on file and signed.  100U in left lower extremity tibialis anterior  All injections were done after obtaining appropriate [place] visualization and after negative drawback for blood. The patient tolerated the procedure well. Post procedure instructions were given. A followup appointment was made.   Would benefit from a custom heel wedge for her spasticity

## 2021-11-21 ENCOUNTER — Ambulatory Visit: Payer: Medicare Other | Admitting: Physical Therapy

## 2021-11-21 DIAGNOSIS — R2689 Other abnormalities of gait and mobility: Secondary | ICD-10-CM

## 2021-11-21 DIAGNOSIS — R2681 Unsteadiness on feet: Secondary | ICD-10-CM | POA: Diagnosis not present

## 2021-11-21 NOTE — Therapy (Signed)
OUTPATIENT PHYSICAL THERAPY NEURO TREATMENT   Patient Name: Ann Lopez MRN: [id number] DOB:[birth year], 51 y.o., female Today's Date: 11/21/2021   PCP: Hayden Rasmussen, MD REFERRING PROVIDER: Izora Ribas, MD    PT End of Session - 11/21/21 [phone]     Visit Number 3    Number of Visits 9   Plus eval   Date for PT Re-Evaluation 01/04/22    Authorization Type UHC Medicare/Medicaid    PT Start Time 0802    PT Stop Time 0846    PT Time Calculation (min) 44 min    Activity Tolerance Patient tolerated treatment well    Behavior During Therapy WFL for tasks assessed/performed              Past Medical History:  Diagnosis Date   Allergy    Anxiety    Asthma    Back pain    Breast cancer ([place])    Cancer ([place])    Depression    Diabetes mellitus    Headache    Hyperlipemia    Osteopenia    Osteoporosis    osteopenia   Stroke [facility])    Past Surgical History:  Procedure Laterality Date   ABDOMINAL HYSTERECTOMY     B oophorectomy for BRCA1 gene   BREAST SURGERY     CESAREAN SECTION     INCISION AND DRAINAGE ABSCESS Left 11/14/2012   Procedure: INCISION AND DRAINAGE ABSCESS LEFT GROIN;  Surgeon: Jamesetta So, MD;  Location: [facility];  Service: General;  Laterality: Left;   INGUINAL LYMPH NODE BIOPSY Right 11/23/2019   Procedure: INGUINAL LYMPH NODE BIOPSY;  Surgeon: Aviva Signs, MD;  Location: [facility];  Service: General;  Laterality: Right;   IR ANGIO VERTEBRAL SEL SUBCLAVIAN INNOMINATE UNI R MOD SED  03/02/2019   IR CT HEAD LTD  03/02/2019   IR INTRAVSC STENT CERV CAROTID W/O EMB-PROT MOD SED INC ANGIO  03/02/2019   IR PERCUTANEOUS ART THROMBECTOMY/INFUSION INTRACRANIAL INC DIAG ANGIO  03/02/2019   LYMPHADENECTOMY     MASTECTOMY Bilateral    RADIOLOGY WITH ANESTHESIA N/A 03/02/2019   Procedure: IR WITH ANESTHESIA;  Surgeon: Luanne Bras, MD;  Location: [place];  Service: Radiology;  Laterality: N/A;   Patient Active Problem List   Diagnosis Date Noted    Lymph node enlargement    Cellulitis of groin, right    Cellulitis    Sepsis ([place]) 11/20/2019   Acute inguinal lymphadenitis    Vascular headache    Other chronic pain    Uncontrolled type 2 diabetes mellitus with hyperglycemia (HCC)    Labile blood glucose    Panic disorder with agoraphobia and moderate panic attacks    Major depressive disorder, recurrent episode, moderate (HCC)    MCI (mild cognitive impairment) with memory loss    Acute ischemic right middle cerebral artery (MCA) stroke ([place]) 03/09/2019   Left hemiparesis (HCC)    Bradycardia    Tachypnea    SIRS (systemic inflammatory response syndrome) (HCC)    Hypokalemia    Diabetes mellitus type 2 in obese ([place])    Tobacco abuse    History of breast cancer    Cognitive deficits    Dysphagia, post-stroke    Stroke (cerebrum) ([place]) 03/02/2019   Middle cerebral artery embolism, right 03/02/2019   Chest pain 12/19/2015   Numbness 12/19/2015   Femoral hernia 03/24/2014   Dyslipidemia 09/10/2011   BRCA1 positive 09/10/2011   Breast cancer ([place]) 09/10/2011   H/O gastric bypass;  05/14/11([facility]) 09/10/2011   Essential hypertension 10/28/2008   DEPRESSION/ANXIETY 02/19/2007   CARPAL TUNNEL SYNDROME, RIGHT 02/19/2007   ALLERGIC RHINITIS 02/19/2007   GERD 02/19/2007   IRRITABLE BOWEL SYNDROME 02/19/2007   HERPES GENITALIS 12/30/2006   Diabetes ([place]) 12/30/2006   OSTEOPENIA 12/30/2006   ANOMALY, CONGENITAL, NERVOUS SYSTEM NEC 12/30/2006    ONSET DATE: 10/30/2021 (referral)  REFERRING DIAG: O87.579,J28.2 (ICD-10-CM) - Spasticity as late effect of cerebrovascular accident (CVA)   THERAPY DIAG:  Unsteadiness on feet  Other abnormalities of gait and mobility  Rationale for Evaluation and Treatment Rehabilitation  SUBJECTIVE:                                                                                                                                                                                               SUBJECTIVE STATEMENT: Pt reports her most recent botox injection has been helpful already (placed medially instead of laterally in distal proximal anterior calf). No new falls or near-misses. Has not been doing HEP  Pt accompanied by: self  PERTINENT HISTORY: right MCA infarct in November 2020 secondary to terminal right ICA, MCA and ACA occlusion status post mechanical thrombectomy and rescue right proximal ICA stent placement with residual left sided spasticity and mood disorder with pseudobulbar affect.  Vascular risk factors of diabetes, hyperlipidemia, smoking and carotid stenosis. Osteopenia   PAIN:  Are you having pain? No  PRECAUTIONS: Fall  FALLS: Has patient fallen in last 6 months? Yes. Number of falls multiple, always occurs during walking. Most recent fall occurred >week ago. Pt reports she can often catch herself    PATIENT GOALS "To get my walking back"  OBJECTIVE:    TODAY'S TREATMENT: NMR  Lengthy discussion regarding most recent botox injection (insertion, muscle targeted) using anatomical photos to explain muscle's action/origin/insertion to pt.  Pt brought in her old AFO, but has posteromedial strut so will not assist w/excessive extensor tone (inversion).  Had pt walk barefoot x50' to assess L ankle/foot movement throughout gait cycle. Noted significant inversion/supination from IC to TO of LLE, resulting in L foot catching on RLE during swing phase. Trialed posterolateral AFO (Thuasne SpryStep) to assess if external lateral support will reduce excessive extensor tone of LLE. Pt ambulated ~75' w/AFO and noted reduced supination throughout stance phase of LLE and decreased catching of L foot on R foot, but AFO provided too much assistance w/DF, causing pt to fight the AFO during stance phase.  Regressed to 1/2" thick black wedge placed in posterolateral aspect of L shoe and pt ambulated >100' and demonstrated minimal extensor tone in stance phase w/reduced tone in  swing  phase and adequate abduction of BLE feet.  Encouraged pt to wear wedge in shoe for the week to determine if a shoe insert paired w/botox will be beneficial for pt long term. Pt verbalized understanding.     PATIENT EDUCATION: Education details: Wearing wedge in shoe this week, continue HEP Person educated: Patient Education method: Explanation, Demonstration, and Handouts Education comprehension: verbalized understanding   HOME EXERCISE PROGRAM: Access Code: [email or web address] URL: [email or web address] Date: 11/09/2021 Prepared by: Excell Seltzer  Exercises - Towel Scrunches  - 1 x daily - 7 x weekly - 3 sets - 10 reps - Long Sitting Ankle Plantar Flexion with Resistance  - 1 x daily - 7 x weekly - 1 sets - 5 reps - 30 hold - Ankle Inversion Eversion PROM in Dorsiflexion  - 1 x daily - 7 x weekly - 1 sets - 5 reps - 30 hold    GOALS: Goals reviewed with patient? Yes  SHORT TERM GOALS: Target date: 11/30/2021  Pt will be independent with initial HEP for improved ankle strength, balance, transfers and gait.  Baseline: not established on eval Goal status: INITIAL  2.  Pt will trial various orthotics/braces to facilitate improved pronation of L foot and reduce excessive supination.  Baseline:  Goal status: INITIAL   LONG TERM GOALS: Target date: 01/16/2022  Pt will be independent with final HEP for improved strength, balance, transfers and gait.  Baseline:  Goal status: INITIAL  2.  Pt will improve Berg score to 56/56 for decreased fall risk  Baseline: 52 (8/3) Goal status: INITIAL  3.  Pt will improve FOTO to 55 for improved functional mobility  Baseline: 54 Goal status: INITIAL  4.  Pt to score 120/120 on MCTSIB tfor improved balance and decreased fall risk Baseline: 107/120 (8/3) Goal status: INITIAL  5.  Pt will report 2 or fewer falls since evaluation for reduced fall risk and improved safety w/mobility  Baseline: >20 falls in past few months   Goal status: INITIAL  ASSESSMENT:  CLINICAL IMPRESSION: Emphasis of skilled PT session on assessing various orthotics/wedges w/gait to determine less restrictive option that reduces pt's extensor tone w/stance phase. Pt tolerates 1/2" posterolateral wedge in L shoe well, stating she cannot feel it and demonstrating reduced supination throughout stance phase and improved abduction of BLEs. Pt satisfied w/her recent botox injection as well, stating it has been the most helpful so far. Continue POC.    OBJECTIVE IMPAIRMENTS Abnormal gait, decreased balance, decreased coordination, decreased mobility, difficulty walking, and impaired tone.   ACTIVITY LIMITATIONS carrying, lifting, stairs, transfers, reach over head, locomotion level, and caring for others  PARTICIPATION LIMITATIONS: meal prep, cleaning, laundry, shopping, community activity, and yard work  [place], Past/current experiences, Time since onset of injury/illness/exacerbation, and 1 comorbidity: LLE spasticity  are also affecting patient's functional outcome.   REHAB POTENTIAL: Good  CLINICAL DECISION MAKING: Stable/uncomplicated  EVALUATION COMPLEXITY: Low  PLAN: PT FREQUENCY: 1x/week  PT DURATION: 8 weeks  PLANNED INTERVENTIONS: Therapeutic exercises, Therapeutic activity, Neuromuscular re-education, Balance training, Gait training, Patient/Family education, Self Care, Orthotic/Fit training, Electrical stimulation, Manual therapy, and Re-evaluation  PLAN FOR NEXT SESSION: Did pt wear wedge in shoe? How did it go? Potential to get a custom shoe insert. Add to HEP for ankle strength and mobility, dynamic balance,  tandem stance, SLS, stance on foam with eyes closed, rocker board, dynadisc     Wallis Vancott E Wiletta Bermingham, PT, DPT 11/21/2021, 8:47 AM

## 2021-11-28 ENCOUNTER — Ambulatory Visit: Payer: Medicare Other

## 2021-12-05 ENCOUNTER — Ambulatory Visit: Payer: Medicare Other | Admitting: Physical Therapy

## 2021-12-05 DIAGNOSIS — R2681 Unsteadiness on feet: Secondary | ICD-10-CM | POA: Diagnosis not present

## 2021-12-05 DIAGNOSIS — R2689 Other abnormalities of gait and mobility: Secondary | ICD-10-CM

## 2021-12-05 DIAGNOSIS — M6281 Muscle weakness (generalized): Secondary | ICD-10-CM

## 2021-12-05 NOTE — Therapy (Signed)
OUTPATIENT PHYSICAL THERAPY NEURO TREATMENT   Patient Name: Ann Lopez MRN: [id number] DOB:1970/11/23, 51 y.o., female Today's Date: 12/05/2021   PCP: Hayden Rasmussen, MD REFERRING PROVIDER: Izora Ribas, MD    PT End of Session - 12/05/21 0802     Visit Number 4    Number of Visits 9   Plus eval   Date for PT Re-Evaluation 01/04/22    Authorization Type UHC Medicare/Medicaid    PT Start Time 0800    PT Stop Time 0838    PT Time Calculation (min) 38 min    Activity Tolerance Patient tolerated treatment well    Behavior During Therapy WFL for tasks assessed/performed               Past Medical History:  Diagnosis Date   Allergy    Anxiety    Asthma    Back pain    Breast cancer ([place])    Cancer ([place])    Depression    Diabetes mellitus    Headache    Hyperlipemia    Osteopenia    Osteoporosis    osteopenia   Stroke [facility])    Past Surgical History:  Procedure Laterality Date   ABDOMINAL HYSTERECTOMY     B oophorectomy for BRCA1 gene   BREAST SURGERY     CESAREAN SECTION     INCISION AND DRAINAGE ABSCESS Left 11/14/2012   Procedure: INCISION AND DRAINAGE ABSCESS LEFT GROIN;  Surgeon: Jamesetta So, MD;  Location: [facility];  Service: General;  Laterality: Left;   INGUINAL LYMPH NODE BIOPSY Right 11/23/2019   Procedure: INGUINAL LYMPH NODE BIOPSY;  Surgeon: Aviva Signs, MD;  Location: [facility];  Service: General;  Laterality: Right;   IR ANGIO VERTEBRAL SEL SUBCLAVIAN INNOMINATE UNI R MOD SED  03/02/2019   IR CT HEAD LTD  03/02/2019   IR INTRAVSC STENT CERV CAROTID W/O EMB-PROT MOD SED INC ANGIO  03/02/2019   IR PERCUTANEOUS ART THROMBECTOMY/INFUSION INTRACRANIAL INC DIAG ANGIO  03/02/2019   LYMPHADENECTOMY     MASTECTOMY Bilateral    RADIOLOGY WITH ANESTHESIA N/A 03/02/2019   Procedure: IR WITH ANESTHESIA;  Surgeon: Luanne Bras, MD;  Location: [place];  Service: Radiology;  Laterality: N/A;   Patient Active Problem List   Diagnosis Date Noted    Lymph node enlargement    Cellulitis of groin, right    Cellulitis    Sepsis ([place]) 11/20/2019   Acute inguinal lymphadenitis    Vascular headache    Other chronic pain    Uncontrolled type 2 diabetes mellitus with hyperglycemia (HCC)    Labile blood glucose    Panic disorder with agoraphobia and moderate panic attacks    Major depressive disorder, recurrent episode, moderate (HCC)    MCI (mild cognitive impairment) with memory loss    Acute ischemic right middle cerebral artery (MCA) stroke ([place]) 03/09/2019   Left hemiparesis (HCC)    Bradycardia    Tachypnea    SIRS (systemic inflammatory response syndrome) (HCC)    Hypokalemia    Diabetes mellitus type 2 in obese ([place])    Tobacco abuse    History of breast cancer    Cognitive deficits    Dysphagia, post-stroke    Stroke (cerebrum) ([place]) 03/02/2019   Middle cerebral artery embolism, right 03/02/2019   Chest pain 12/19/2015   Numbness 12/19/2015   Femoral hernia 03/24/2014   Dyslipidemia 09/10/2011   BRCA1 positive 09/10/2011   Breast cancer ([place]) 09/10/2011   H/O gastric  bypass; 05/14/11([facility]) 09/10/2011   Essential hypertension 10/28/2008   DEPRESSION/ANXIETY 02/19/2007   CARPAL TUNNEL SYNDROME, RIGHT 02/19/2007   ALLERGIC RHINITIS 02/19/2007   GERD 02/19/2007   IRRITABLE BOWEL SYNDROME 02/19/2007   HERPES GENITALIS 12/30/2006   Diabetes ([place]) 12/30/2006   OSTEOPENIA 12/30/2006   ANOMALY, CONGENITAL, NERVOUS SYSTEM NEC 12/30/2006    ONSET DATE: 10/30/2021 (referral)  REFERRING DIAG: T95.396,D28.9 (ICD-10-CM) - Spasticity as late effect of cerebrovascular accident (CVA)   THERAPY DIAG:  Unsteadiness on feet  Other abnormalities of gait and mobility  Muscle weakness (generalized)  Rationale for Evaluation and Treatment Rehabilitation  SUBJECTIVE:                                                                                                                                                                                               SUBJECTIVE STATEMENT: Pt reports she saw the benefit from her Botox injection right away for the first few days then that went away. Per MD [name] see max benefit at 30 days. Pt tried the wedge in her shoe after her last PT session but by the end of the day she was having heel pain and was not able to tolerate the wedge.  Pt accompanied by: self  PERTINENT HISTORY: right MCA infarct in November 2020 secondary to terminal right ICA, MCA and ACA occlusion status post mechanical thrombectomy and rescue right proximal ICA stent placement with residual left sided spasticity and mood disorder with pseudobulbar affect.  Vascular risk factors of diabetes, hyperlipidemia, smoking and carotid stenosis. Osteopenia   PAIN:  Are you having pain? No  PRECAUTIONS: Fall  FALLS: Has patient fallen in last 6 months? Yes. Number of falls multiple, always occurs during walking. Most recent fall occurred >week ago. Pt reports she can often catch herself    PATIENT GOALS "To get my walking back"  OBJECTIVE:    TODAY'S TREATMENT: THER EX: Reviewed pt's HEP and adjusted exercises based on patient response. See below for updated HEP. Provided new handout and theraband for patient.  Discussion regarding pt's current deficits and frustration with ongoing spasticity. Educated pt on spasticity and management from a PT perspective including stretching/ROM, strengthening, and bracing and that medical team manages it via Botox injections and other medications.   PATIENT EDUCATION: Education details: continue HEP, spasticity management Person educated: Patient Education method: Explanation, Demonstration, and Handouts Education comprehension: verbalized understanding   HOME EXERCISE PROGRAM: Access Code: [email or web address] URL: [email or web address] Date: 11/09/2021 Prepared by: Excell Seltzer  Exercises - Long Sitting Ankle Plantar Flexion with Resistance  - 1 x daily - 7 x  weekly - 1  sets - 5 reps - 30 hold - Ankle Inversion Eversion PROM in Dorsiflexion  - 1 x daily - 7 x weekly - 1 sets - 5 reps - 30 hold - Seated Self Great Toe and Other Toes Stretch  - 1 x daily - 7 x weekly - 3 sets - 10 reps    GOALS: Goals reviewed with patient? Yes  SHORT TERM GOALS: Target date: 11/30/2021  [name] be independent with initial HEP for improved ankle strength, balance, transfers and gait.  Baseline: not established on eval Goal status: MET  2.  [name] trial various orthotics/braces to facilitate improved pronation of L foot and reduce excessive supination.  Baseline:  Goal status: IN PROGRESS   LONG TERM GOALS: Target date: 01/30/2022  [name] be independent with final HEP for improved strength, balance, transfers and gait.  Baseline:  Goal status: INITIAL  2.  [name] improve Berg score to 56/56 for decreased fall risk  Baseline: 52 (8/3) Goal status: INITIAL  3.  [name] improve FOTO to 55 for improved functional mobility  Baseline: 54 Goal status: INITIAL  4.  Pt to score 120/120 on MCTSIB tfor improved balance and decreased fall risk Baseline: 107/120 (8/3) Goal status: INITIAL  5.  [name] report 2 or fewer falls since evaluation for reduced fall risk and improved safety w/mobility  Baseline: >20 falls in past few months  Goal status: INITIAL  ASSESSMENT:  CLINICAL IMPRESSION: Emphasis of skilled PT session on addressing ongoing spasticity in L ankle invertors and flexor digitorum longus leading to ankle inversion and toes 2-4 "curling" in WB positions. Pt continues to be frustrated by ongoing spasticity, felt that most recent Botox injection was helpful for a few days and she did feel that her toes relaxed a bit initially but she is no longer feeling the effects of the injection. Education with patient during session regarding spasticity management from a PT perspective including stretching/ROM, strengthening, and trial of possible bracing  options. Per last PT session heel wedge was helpful at correcting gait deviations but pt exhibited decreased tolerance for wearing of heel wedge. Discussed possibility of a custom wedge with patient and Lopez further discuss next session. Updated pt's HEP based on toe scrunches no longer being appropriate for patient, added in toe stretches. Also addressed STG, pt meeting 1/2 goals due to ongoing assessment of custom bracing needs. Continue POC.    OBJECTIVE IMPAIRMENTS Abnormal gait, decreased balance, decreased coordination, decreased mobility, difficulty walking, and impaired tone.   ACTIVITY LIMITATIONS carrying, lifting, stairs, transfers, reach over head, locomotion level, and caring for others  PARTICIPATION LIMITATIONS: meal prep, cleaning, laundry, shopping, community activity, and yard work  [place], Past/current experiences, Time since onset of injury/illness/exacerbation, and 1 comorbidity: LLE spasticity  are also affecting patient's functional outcome.   REHAB POTENTIAL: Good  CLINICAL DECISION MAKING: Stable/uncomplicated  EVALUATION COMPLEXITY: Low  PLAN: PT FREQUENCY: 1x/week  PT DURATION: 8 weeks  PLANNED INTERVENTIONS: Therapeutic exercises, Therapeutic activity, Neuromuscular re-education, Balance training, Gait training, Patient/Family education, Self Care, Orthotic/Fit training, Electrical stimulation, Manual therapy, and Re-evaluation  PLAN FOR NEXT SESSION: pt did not tolerate wedge in shoe, can she get a custom heel wedge that is more comfortable/tolerable? Add to HEP for ankle strength and mobility, dynamic balance,  tandem stance, SLS, stance on foam with eyes closed, rocker board, dynadisc    Excell Seltzer, PT, DPT, CSRS 12/05/2021, 9:00 AM

## 2021-12-12 ENCOUNTER — Ambulatory Visit: Payer: Medicare Other | Attending: Physical Medicine and Rehabilitation | Admitting: Physical Therapy

## 2021-12-12 DIAGNOSIS — M6281 Muscle weakness (generalized): Secondary | ICD-10-CM | POA: Insufficient documentation

## 2021-12-12 DIAGNOSIS — R2681 Unsteadiness on feet: Secondary | ICD-10-CM | POA: Insufficient documentation

## 2021-12-12 DIAGNOSIS — R2689 Other abnormalities of gait and mobility: Secondary | ICD-10-CM | POA: Insufficient documentation

## 2021-12-12 DIAGNOSIS — Z9181 History of falling: Secondary | ICD-10-CM | POA: Insufficient documentation

## 2021-12-19 ENCOUNTER — Other Ambulatory Visit: Payer: Self-pay | Admitting: Physical Medicine and Rehabilitation

## 2021-12-19 ENCOUNTER — Ambulatory Visit: Payer: Medicare Other | Admitting: Physical Therapy

## 2021-12-19 ENCOUNTER — Telehealth: Payer: Self-pay | Admitting: Physical Therapy

## 2021-12-19 DIAGNOSIS — M6281 Muscle weakness (generalized): Secondary | ICD-10-CM

## 2021-12-19 DIAGNOSIS — R2689 Other abnormalities of gait and mobility: Secondary | ICD-10-CM

## 2021-12-19 DIAGNOSIS — Z9181 History of falling: Secondary | ICD-10-CM

## 2021-12-19 DIAGNOSIS — R2681 Unsteadiness on feet: Secondary | ICD-10-CM

## 2021-12-19 DIAGNOSIS — I69398 Other sequelae of cerebral infarction: Secondary | ICD-10-CM

## 2021-12-19 NOTE — Therapy (Signed)
OUTPATIENT PHYSICAL THERAPY NEURO TREATMENT   Patient Name: Ann Lopez MRN: [id number] DOB:[birth year], 51 y.o., female Today's Date: 12/19/2021   PCP: Hayden Rasmussen, MD REFERRING PROVIDER: Izora Ribas, MD    PT End of Session - 12/19/21 0805     Visit Number 5    Number of Visits 9   Plus eval   Date for PT Re-Evaluation 01/04/22    Authorization Type UHC Medicare/Medicaid    PT Start Time 0805   pt late   PT Stop Time 0850    PT Time Calculation (min) 45 min    Activity Tolerance Patient tolerated treatment well    Behavior During Therapy WFL for tasks assessed/performed                Past Medical History:  Diagnosis Date   Allergy    Anxiety    Asthma    Back pain    Breast cancer ([place])    Cancer ([place])    Depression    Diabetes mellitus    Headache    Hyperlipemia    Osteopenia    Osteoporosis    osteopenia   Stroke [facility])    Past Surgical History:  Procedure Laterality Date   ABDOMINAL HYSTERECTOMY     B oophorectomy for BRCA1 gene   BREAST SURGERY     CESAREAN SECTION     INCISION AND DRAINAGE ABSCESS Left 11/14/2012   Procedure: INCISION AND DRAINAGE ABSCESS LEFT GROIN;  Surgeon: Jamesetta So, MD;  Location: [facility];  Service: General;  Laterality: Left;   INGUINAL LYMPH NODE BIOPSY Right 11/23/2019   Procedure: INGUINAL LYMPH NODE BIOPSY;  Surgeon: Aviva Signs, MD;  Location: [facility];  Service: General;  Laterality: Right;   IR ANGIO VERTEBRAL SEL SUBCLAVIAN INNOMINATE UNI R MOD SED  03/02/2019   IR CT HEAD LTD  03/02/2019   IR INTRAVSC STENT CERV CAROTID W/O EMB-PROT MOD SED INC ANGIO  03/02/2019   IR PERCUTANEOUS ART THROMBECTOMY/INFUSION INTRACRANIAL INC DIAG ANGIO  03/02/2019   LYMPHADENECTOMY     MASTECTOMY Bilateral    RADIOLOGY WITH ANESTHESIA N/A 03/02/2019   Procedure: IR WITH ANESTHESIA;  Surgeon: Luanne Bras, MD;  Location: [place];  Service: Radiology;  Laterality: N/A;   Patient Active Problem List   Diagnosis  Date Noted   Lymph node enlargement    Cellulitis of groin, right    Cellulitis    Sepsis ([place]) 11/20/2019   Acute inguinal lymphadenitis    Vascular headache    Other chronic pain    Uncontrolled type 2 diabetes mellitus with hyperglycemia (HCC)    Labile blood glucose    Panic disorder with agoraphobia and moderate panic attacks    Major depressive disorder, recurrent episode, moderate (HCC)    MCI (mild cognitive impairment) with memory loss    Acute ischemic right middle cerebral artery (MCA) stroke ([place]) 03/09/2019   Left hemiparesis (HCC)    Bradycardia    Tachypnea    SIRS (systemic inflammatory response syndrome) (HCC)    Hypokalemia    Diabetes mellitus type 2 in obese ([place])    Tobacco abuse    History of breast cancer    Cognitive deficits    Dysphagia, post-stroke    Stroke (cerebrum) ([place]) 03/02/2019   Middle cerebral artery embolism, right 03/02/2019   Chest pain 12/19/2015   Numbness 12/19/2015   Femoral hernia 03/24/2014   Dyslipidemia 09/10/2011   BRCA1 positive 09/10/2011   Breast cancer ([place]) 09/10/2011  H/O gastric bypass; 05/14/11([facility]) 09/10/2011   Essential hypertension 10/28/2008   DEPRESSION/ANXIETY 02/19/2007   CARPAL TUNNEL SYNDROME, RIGHT 02/19/2007   ALLERGIC RHINITIS 02/19/2007   GERD 02/19/2007   IRRITABLE BOWEL SYNDROME 02/19/2007   HERPES GENITALIS 12/30/2006   Diabetes ([place]) 12/30/2006   OSTEOPENIA 12/30/2006   ANOMALY, CONGENITAL, NERVOUS SYSTEM NEC 12/30/2006    ONSET DATE: 10/30/2021 (referral)  REFERRING DIAG: Q67.619,J09.3 (ICD-10-CM) - Spasticity as late effect of cerebrovascular accident (CVA)   THERAPY DIAG:  Unsteadiness on feet  Other abnormalities of gait and mobility  Muscle weakness (generalized)  History of falling  Rationale for Evaluation and Treatment Rehabilitation  SUBJECTIVE:                                                                                                                                                                                               SUBJECTIVE STATEMENT: Pt reports ongoing frustration with ongoing spasticity in L foot/ankle. No falls since last visit but 3-4 near falls due to L ankle inversion spasticity. No pain.  Pt feels like her tennis shoes get "warped" due to the way she is walking with her spasticity and then her tennis shoes get worn out and aren't helpful at all anymore when she is walking, wearing down the lateral edges of L shoe.  Pt also reports some frustration during session as she notices that her 2nd toe is starting to "curl" along with her 3-5 toes in WB position.  Pt accompanied by: self  PERTINENT HISTORY: right MCA infarct in November 2020 secondary to terminal right ICA, MCA and ACA occlusion status post mechanical thrombectomy and rescue right proximal ICA stent placement with residual left sided spasticity and mood disorder with pseudobulbar affect.  Vascular risk factors of diabetes, hyperlipidemia, smoking and carotid stenosis. Osteopenia   PAIN:  Are you having pain? No  PRECAUTIONS: Fall  FALLS: Has patient fallen in last 6 months? Yes. Number of falls multiple, always occurs during walking. Most recent fall occurred >week ago. Pt reports she can often catch herself    PATIENT GOALS "To get my walking back"  OBJECTIVE:    TODAY'S TREATMENT: THER ACT: Discussed process of getting a custom heel wedge with patient. Sent Telephone Encounter to referring provider requesting an order for a custom heel wedge. Once order obtained will fax information to [facility] and pt will be contacted about setting up an appointment. Pt also concerned with cost of a custom heel wedge, encouraged her to speak with [facility] when setting up her appointment about cost and insurance coverage.  GAIT/ORTHOTIC FIT: Trial of Dr. Felicie Morn insole cut into heel  wedge initially. Pt reports with just heel wedge portion of this insole she feels that the wedge is pushing  her into more inversion during gait.  Cut heel wedge again with medial portion removed. During gait with new configuration of heel wedge pt feels that inversion is better controlled but still feels that she needs something along lateral portion of her foot. Added a long, lateral piece of insole into shoe along with heel wedge. Pt reports most control with this configuration of wedging. Despite decreased space in her shoe with wedges and with pt's insole she has more comfort with use of her own insole on top of wedges vs not using her insole. Encouraged pt to wear this configuration for the next few days then report back if she feels it was beneficial at controlling her inversion spasticity during gait and if it was more comfortable than previous wedge trialed.   PATIENT EDUCATION: Education details: continue HEP, spasticity management, custom AFO procedure Person educated: Patient Education method: Explanation, Demonstration, and Handouts Education comprehension: verbalized understanding   HOME EXERCISE PROGRAM: Access Code: [email or web address] URL: [email or web address] Date: 11/09/2021 Prepared by: Excell Seltzer  Exercises - Long Sitting Ankle Plantar Flexion with Resistance  - 1 x daily - 7 x weekly - 1 sets - 5 reps - 30 hold - Ankle Inversion Eversion PROM in Dorsiflexion  - 1 x daily - 7 x weekly - 1 sets - 5 reps - 30 hold - Seated Self Great Toe and Other Toes Stretch  - 1 x daily - 7 x weekly - 3 sets - 10 reps    GOALS: Goals reviewed with patient? Yes  SHORT TERM GOALS: Target date: 11/30/2021  Pt will be independent with initial HEP for improved ankle strength, balance, transfers and gait.  Baseline: not established on eval Goal status: MET  2.  Pt will trial various orthotics/braces to facilitate improved pronation of L foot and reduce excessive supination.  Baseline:  Goal status: IN PROGRESS   LONG TERM GOALS: Target date: 02/13/2022  Pt will be independent  with final HEP for improved strength, balance, transfers and gait.  Baseline:  Goal status: INITIAL  2.  Pt will improve Berg score to 56/56 for decreased fall risk  Baseline: 52 (8/3) Goal status: INITIAL  3.  Pt will improve FOTO to 55 for improved functional mobility  Baseline: 54 Goal status: INITIAL  4.  Pt to score 120/120 on MCTSIB tfor improved balance and decreased fall risk Baseline: 107/120 (8/3) Goal status: INITIAL  5.  Pt will report 2 or fewer falls since evaluation for reduced fall risk and improved safety w/mobility  Baseline: >20 falls in past few months  Goal status: INITIAL  ASSESSMENT:  CLINICAL IMPRESSION: Emphasis of skilled PT session on continuing to discuss spasticity management and trialing custom wedging options for patient. Pt exhibits most benefit as well as comfort from a heel wedge cut out of a Dr. Felicie Morn insole with medial aspect removed as well as a long, lateral piece placed in her shoe under her insole. Initiated process of obtaining an order for a new custom AFO/wedge so that pt can have an appointment with [facility] to further assess custom options for spasticity management. Continue POC.   OBJECTIVE IMPAIRMENTS Abnormal gait, decreased balance, decreased coordination, decreased mobility, difficulty walking, and impaired tone.   ACTIVITY LIMITATIONS carrying, lifting, stairs, transfers, reach over head, locomotion level, and caring for others  PARTICIPATION LIMITATIONS: meal prep, cleaning, laundry, shopping, community activity, and yard work  PERSONAL FACTORS Fitness, Past/current experiences, Time since onset of injury/illness/exacerbation, and 1 comorbidity: LLE spasticity  are also affecting patient's functional outcome.   REHAB POTENTIAL: Good  CLINICAL DECISION MAKING: Stable/uncomplicated  EVALUATION COMPLEXITY: Low  PLAN: PT FREQUENCY: 1x/week  PT DURATION: 8 weeks  PLANNED INTERVENTIONS: Therapeutic exercises, Therapeutic  activity, Neuromuscular re-education, Balance training, Gait training, Patient/Family education, Self Care, Orthotic/Fit training, Electrical stimulation, Manual therapy, and Re-evaluation  PLAN FOR NEXT SESSION: is there an order for custom wedge yet? Add to HEP for ankle strength and mobility, dynamic balance,  tandem stance, SLS, stance on foam with eyes closed, rocker board, dynadisc    Excell Seltzer, PT, DPT, CSRS 12/19/2021, 8:52 AM

## 2021-12-19 NOTE — Telephone Encounter (Signed)
Dr. Ranell Patrick,  Ann Lopez was evaluated by PT on 11/02/2021.  The patient would benefit from an evaluation for a custom heel wedge for her LLE to control her spasticity.    If you agree, please place an order in [facility] workque in [facility] or fax the order to [phone].  Thank you, Excell Seltzer, PT, DPT, [facility] [address] [place], [place]  [address] Phone:  [phone] Fax:  [phone]

## 2021-12-26 ENCOUNTER — Ambulatory Visit: Payer: Medicare Other | Admitting: Physical Therapy

## 2021-12-26 DIAGNOSIS — R2681 Unsteadiness on feet: Secondary | ICD-10-CM | POA: Diagnosis not present

## 2021-12-26 DIAGNOSIS — Z9181 History of falling: Secondary | ICD-10-CM

## 2021-12-26 DIAGNOSIS — R2689 Other abnormalities of gait and mobility: Secondary | ICD-10-CM

## 2021-12-26 NOTE — Therapy (Signed)
OUTPATIENT PHYSICAL THERAPY NEURO TREATMENT- DISCHARGE SUMMARY   Patient Name: Ann Lopez MRN: [id number] DOB:Oct 18, 1970, 51 y.o., female Today's Date: 12/26/2021   PCP: Hayden Rasmussen, MD REFERRING PROVIDER: Izora Ribas, MD   PHYSICAL THERAPY DISCHARGE SUMMARY  Visits from Start of Care: 6  Current functional level related to goals / functional outcomes: See below   Remaining deficits: Instability w/gait due to continued spasticity of LLE, recurrent falls due to spasticity of L foot   Education / Equipment: HEP, sent orthotic order to [facility]    Patient agrees to discharge. Patient goals were not met. Patient is being discharged due to did not respond to therapy.     PT End of Session - 12/26/21 0809     Visit Number 6    Number of Visits 9   Plus eval   Date for PT Re-Evaluation 01/04/22    Authorization Type UHC Medicare/Medicaid    PT Start Time [phone]   Pt arrived late   PT Stop Time 0835   DC   PT Time Calculation (min) 29 min    Activity Tolerance Patient tolerated treatment well    Behavior During Therapy WFL for tasks assessed/performed                 Past Medical History:  Diagnosis Date   Allergy    Anxiety    Asthma    Back pain    Breast cancer ([place])    Cancer ([place])    Depression    Diabetes mellitus    Headache    Hyperlipemia    Osteopenia    Osteoporosis    osteopenia   Stroke [facility])    Past Surgical History:  Procedure Laterality Date   ABDOMINAL HYSTERECTOMY     B oophorectomy for BRCA1 gene   BREAST SURGERY     CESAREAN SECTION     INCISION AND DRAINAGE ABSCESS Left 11/14/2012   Procedure: INCISION AND DRAINAGE ABSCESS LEFT GROIN;  Surgeon: Jamesetta So, MD;  Location: [facility];  Service: General;  Laterality: Left;   INGUINAL LYMPH NODE BIOPSY Right 11/23/2019   Procedure: INGUINAL LYMPH NODE BIOPSY;  Surgeon: Aviva Signs, MD;  [facility];  Service: General;  Laterality: Right;   IR ANGIO VERTEBRAL SEL  SUBCLAVIAN INNOMINATE UNI R MOD SED  03/02/2019   IR CT HEAD LTD  03/02/2019   IR INTRAVSC STENT CERV CAROTID W/O EMB-PROT MOD SED INC ANGIO  03/02/2019   IR PERCUTANEOUS ART THROMBECTOMY/INFUSION INTRACRANIAL INC DIAG ANGIO  03/02/2019   LYMPHADENECTOMY     MASTECTOMY Bilateral    RADIOLOGY WITH ANESTHESIA N/A 03/02/2019   Procedure: IR WITH ANESTHESIA;  Surgeon: Luanne Bras, MD;  Location: [place];  Service: Radiology;  Laterality: N/A;   Patient Active Problem List   Diagnosis Date Noted   Lymph node enlargement    Cellulitis of groin, right    Cellulitis    Sepsis ([place]) 11/20/2019   Acute inguinal lymphadenitis    Vascular headache    Other chronic pain    Uncontrolled type 2 diabetes mellitus with hyperglycemia (HCC)    Labile blood glucose    Panic disorder with agoraphobia and moderate panic attacks    Major depressive disorder, recurrent episode, moderate (HCC)    MCI (mild cognitive impairment) with memory loss    Acute ischemic right middle cerebral artery (MCA) stroke ([place]) 03/09/2019   Left hemiparesis (HCC)    Bradycardia    Tachypnea  SIRS (systemic inflammatory response syndrome) (HCC)    Hypokalemia    Diabetes mellitus type 2 in obese (HCC)    Tobacco abuse    History of breast cancer    Cognitive deficits    Dysphagia, post-stroke    Stroke (cerebrum) ([place]) 03/02/2019   Middle cerebral artery embolism, right 03/02/2019   Chest pain 12/19/2015   Numbness 12/19/2015   Femoral hernia 03/24/2014   Dyslipidemia 09/10/2011   BRCA1 positive 09/10/2011   Breast cancer ([place]) 09/10/2011   H/O gastric bypass; 05/14/11([facility]) 09/10/2011   Essential hypertension 10/28/2008   DEPRESSION/ANXIETY 02/19/2007   CARPAL TUNNEL SYNDROME, RIGHT 02/19/2007   ALLERGIC RHINITIS 02/19/2007   GERD 02/19/2007   IRRITABLE BOWEL SYNDROME 02/19/2007   HERPES GENITALIS 12/30/2006   Diabetes ([place]) 12/30/2006   OSTEOPENIA 12/30/2006   ANOMALY, CONGENITAL, NERVOUS SYSTEM NEC  12/30/2006    ONSET DATE: 10/30/2021 (referral)  REFERRING DIAG: I95.188,C16.6 (ICD-10-CM) - Spasticity as late effect of cerebrovascular accident (CVA)   THERAPY DIAG:  Unsteadiness on feet  Other abnormalities of gait and mobility  History of falling  Rationale for Evaluation and Treatment Rehabilitation  SUBJECTIVE:                                                                                                                                                                                              SUBJECTIVE STATEMENT: Pt reports ongoing frustration with ongoing spasticity in L foot/ankle. Pt fell 3 times on same day of last appointment due to insole in shoe. "I feel like coming here has been a waste of time". Pt reports she is seeking out a second opinion regarding her foot, is going to try to go to [facility] and see a neurologist. Pt states she would prefer to DC today due to not feeling as though PT has helped her.   Pt accompanied by: self  PERTINENT HISTORY: right MCA infarct in November 2020 secondary to terminal right ICA, MCA and ACA occlusion status post mechanical thrombectomy and rescue right proximal ICA stent placement with residual left sided spasticity and mood disorder with pseudobulbar affect.  Vascular risk factors of diabetes, hyperlipidemia, smoking and carotid stenosis. Osteopenia   PAIN:  Are you having pain? No  PRECAUTIONS: Fall  FALLS: Has patient fallen in last 6 months? Yes. Number of falls multiple, always occurs during walking. Most recent fall occurred >week ago. Pt reports she can often catch herself    PATIENT GOALS "To get my walking back"  OBJECTIVE:    TODAY'S TREATMENT: THER ACT: LTG assessment:  MCTSIB: Condition 1: Avg of 3 trials: 30 sec, Condition 2: Avg  of 3 trials: 30 sec, Condition 3: Avg of 3 trials: 30 sec, Condition 4: Avg of 3 trials: 30 sec, and Total Score: 120/120  Discussed plan to DC today and options that pt has  moving forward. Pt looking into going to [facility] to receive second opinion regarding spasticity in L foot, "I am about ready to cut this thing off". Informed pt that therapist faxed orthotic order to [facility] last week and they should be in contact with her soon. Also informed pt she can always return to PT if her mobility needs change. Pt verbalized understanding.    PATIENT EDUCATION: Education details: See above  Person educated: Patient Education method: Explanation, Demonstration, and Handouts Education comprehension: verbalized understanding   HOME EXERCISE PROGRAM: Access Code: [email or web address] URL: [email or web address] Date: 11/09/2021 Prepared by: Excell Seltzer  Exercises - Long Sitting Ankle Plantar Flexion with Resistance  - 1 x daily - 7 x weekly - 1 sets - 5 reps - 30 hold - Ankle Inversion Eversion PROM in Dorsiflexion  - 1 x daily - 7 x weekly - 1 sets - 5 reps - 30 hold - Seated Self Great Toe and Other Toes Stretch  - 1 x daily - 7 x weekly - 3 sets - 10 reps    GOALS: Goals reviewed with patient? Yes  SHORT TERM GOALS: Target date: 11/30/2021  Pt will be independent with initial HEP for improved ankle strength, balance, transfers and gait.  Baseline: not established on eval Goal status: MET  2.  Pt will trial various orthotics/braces to facilitate improved pronation of L foot and reduce excessive supination.  Baseline:  Goal status: IN PROGRESS   LONG TERM GOALS: Target date: 12/28/2021  Pt will be independent with final HEP for improved strength, balance, transfers and gait.  Baseline:  Goal status: MET  2.  Pt will improve Berg score to 56/56 for decreased fall risk  Baseline: 52 (8/3) Goal status: DISCONTINUED   3.  Pt will improve FOTO to 55 for improved functional mobility  Baseline: 54 Goal status: DISCONTINUED - not acute CVA  4.  Pt to score 120/120 on MCTSIB tfor improved balance and decreased fall risk Baseline: 107/120 (8/3);  120/120 on 9/19 Goal status: MET  5.  Pt will report 2 or fewer falls since evaluation for reduced fall risk and improved safety w/mobility  Baseline: >20 falls in past few months  Goal status: NOT MET  ASSESSMENT:  CLINICAL IMPRESSION: Emphasis of skilled PT session on LTG assessment and DC from PT. P has met 2/5, discontinued 2/5 and did not meet 1/5 goals. Pt's goals discontinued due to pt not showing progress in PT and not being applicable to pt's current situation. Pt has been performing her HEP and had notably improved vestibular input with balance today compared to eval. Pt continues to have frequent falls, experiencing 3 last week alone due to inversion of her L foot. Pt verbalized agreement to DC today.   OBJECTIVE IMPAIRMENTS Abnormal gait, decreased balance, decreased coordination, decreased mobility, difficulty walking, and impaired tone.   ACTIVITY LIMITATIONS carrying, lifting, stairs, transfers, reach over head, locomotion level, and caring for others  PARTICIPATION LIMITATIONS: meal prep, cleaning, laundry, shopping, community activity, and yard work  [place], Past/current experiences, Time since onset of injury/illness/exacerbation, and 1 comorbidity: LLE spasticity  are also affecting patient's functional outcome.   REHAB POTENTIAL: Good  CLINICAL DECISION MAKING: Stable/uncomplicated  EVALUATION COMPLEXITY: Low  PLAN: PT FREQUENCY: 1x/week  PT DURATION: 8 weeks  PLANNED INTERVENTIONS: Therapeutic exercises, Therapeutic activity, Neuromuscular re-education, Balance training, Gait training, Patient/Family education, Self Care, Orthotic/Fit training, Electrical stimulation, Manual therapy, and Re-evaluation     Geraldean Walen E Vivan Vanderveer, PT, DPT 12/26/2021, 8:46 AM

## 2022-01-02 ENCOUNTER — Ambulatory Visit: Payer: Medicare Other | Admitting: Physical Therapy

## 2022-01-05 ENCOUNTER — Other Ambulatory Visit: Payer: Self-pay | Admitting: Physical Medicine and Rehabilitation

## 2022-01-05 DIAGNOSIS — R252 Cramp and spasm: Secondary | ICD-10-CM

## 2022-02-19 ENCOUNTER — Encounter: Payer: Self-pay | Admitting: Physical Medicine and Rehabilitation

## 2022-02-19 ENCOUNTER — Encounter
Payer: Medicare Other | Attending: Physical Medicine and Rehabilitation | Admitting: Physical Medicine and Rehabilitation

## 2022-02-19 VITALS — BP 111/73 | HR 69 | Temp 97.6°F | Ht 62.0 in | Wt 150.0 lb

## 2022-02-19 DIAGNOSIS — R252 Cramp and spasm: Secondary | ICD-10-CM | POA: Insufficient documentation

## 2022-02-19 DIAGNOSIS — I69398 Other sequelae of cerebral infarction: Secondary | ICD-10-CM | POA: Insufficient documentation

## 2022-02-19 DIAGNOSIS — G4701 Insomnia due to medical condition: Secondary | ICD-10-CM | POA: Diagnosis present

## 2022-02-19 MED ORDER — TIZANIDINE HCL 4 MG PO TABS: 2.0000 mg | ORAL_TABLET | Freq: Every day | ORAL | 0 refills | Status: DC

## 2022-02-19 NOTE — Patient Instructions (Addendum)
Extracorporeal shockwave therapy for spasticity  Insomnia: -Try to go outside near sunrise -Get exercise during the day.  -Turn off all devices an hour before bedtime.  -Teas that can benefit: chamomile, valerian root, Brahmi (Bacopa) -Can consider over the counter melatonin, magnesium, and/or L-theanine. Melatonin is an anti-oxidant with multiple health benefits. Magnesium is involved in greater than 300 enzymatic reactions in the body and most of [place] are deficient as our soil is often depleted. There are 7 different types of magnesium- Bioptemizer's is a supplement with all 7 types, and each has unique benefits. Magnesium can also help with constipation and anxiety.  -Pistachios naturally increase the production of melatonin -[employer]zy [employer] bamboo bed sheets are free from toxic chemicals.  -Tart cherry juice or a tart cherry supplement can improve sleep and soreness post-workout

## 2022-02-19 NOTE — Progress Notes (Signed)
Subjective:    Patient ID: Ann Lopez, female    DOB: 07-31-1970, 51 y.o.   MRN: [id number]  HPI  Ann Lopez is a 51 year old woman who presents for f/u shoulder pain, right foot spasticity.   1) Left shoulder adhesive capsulitis: -SPRINT PNS removed without complications -She has noted benefit in pain in axillary distribution. -She continues to have pain in the distribution of the suprascapular nerve.  -She has had improved strength in her left shoulder. She loves her therapy, from which she gas graduated.  -She wants the full movement back in her arm back. Her strength is much improved since she participated in the [employer] trial to improve her mobility.  -She had injection and nerve block previously with excellent relief as well.  -pain is much improved, intermittent.  -shoulder pain has greatly improved- wants to do OT to work on her range of motion.   2) Insomnia: Improved.  -sleeps on and off.  -takes restoril sparingly.   3) Impaired mobility and ADLs: She has been able to walk more. She tries to walk as much as she can. She walks 45 minutes per day. She is trying to get rid of her limp  4) Depression: has been stable.  -she has been taking the cymbalta- stopped this -would like to see neuropsych  5) Spasticity:  -she gets toe curling, it is worst in the morning.  -baclofen knocks her out.   6) Decreased emotion: -she now feels that things that used to upset her no longer do.  -she no long is able to cry.  -she has been laughing in serious situations and this really bothers her -she has always been a very sympathetic person  7) Left foot toe curling -Botox was not effective -Baclofen did not help -she has not tried Tizanidine -she asks if she takes it early in the night then would she feel too sleepy in the morning -surgery was recommended to her by ortho   Pain Inventory Average Pain 6 Pain Right Now 0 My pain is intermittent, sharp, and  cramp  In the last 24 hours, has pain interfered with the following? General activity 0 Relation with others 0 Enjoyment of life 0 What TIME of day is your pain at its worst? random Sleep (in general) Poor  Pain is worse with: unsure Pain improves with:  heat Relief from Meds: 7 (pain medicine for back)  Family History  Problem Relation Age of Onset   Cancer Mother 54       breast cancer   Hypertension Sister    Hypothyroidism Sister    Cancer Sister    Hypothyroidism Brother    Cancer Maternal Grandmother 47       breast cancer   Cancer Maternal Grandfather 38       pancreatic cancer   Social History   Socioeconomic History   Marital status: Single    Spouse name: Not on file   Number of children: 4   Years of education: 13   Highest education level: Not on file  Occupational History   Occupation: Disabled  Tobacco Use   Smoking status: Former    Packs/day: 0.50    Types: Cigarettes    Quit date: 03/02/2019    Years since quitting: 2.9   Smokeless tobacco: Never  Vaping Use   Vaping Use: Never used  Substance and Sexual Activity   Alcohol use: No    Alcohol/week: 0.0 standard drinks of alcohol  Drug use: No   Sexual activity: Not Currently  Other Topics Concern   Not on file  Social History Narrative   Marital status: single      Lives: at home with her four sons (12, 22, 87, 72)      Employment: disability for breast cancer, DDD lumbar, anxiety/panic attacks      Tobacco: 1 ppd       Alcohol: none         Right-handed.   2-3 cups caffeine daily.   Social Determinants of Health   Financial Resource Strain: Not on file  Food Insecurity: Not on file  Transportation Needs: Not on file  Physical Activity: Not on file  Stress: Not on file  Social Connections: Not on file   Past Surgical History:  Procedure Laterality Date   ABDOMINAL HYSTERECTOMY     B oophorectomy for BRCA1 gene   BREAST SURGERY     CESAREAN SECTION     INCISION AND DRAINAGE  ABSCESS Left 11/14/2012   Procedure: INCISION AND DRAINAGE ABSCESS LEFT GROIN;  Surgeon: Jamesetta So, MD;  Location: [facility];  Service: General;  Laterality: Left;   INGUINAL LYMPH NODE BIOPSY Right 11/23/2019   Procedure: INGUINAL LYMPH NODE BIOPSY;  Surgeon: Aviva Signs, MD;  Location: [facility];  Service: General;  Laterality: Right;   IR ANGIO VERTEBRAL SEL SUBCLAVIAN INNOMINATE UNI R MOD SED  03/02/2019   IR CT HEAD LTD  03/02/2019   IR INTRAVSC STENT CERV CAROTID W/O EMB-PROT MOD SED INC ANGIO  03/02/2019   IR PERCUTANEOUS ART THROMBECTOMY/INFUSION INTRACRANIAL INC DIAG ANGIO  03/02/2019   LYMPHADENECTOMY     MASTECTOMY Bilateral    RADIOLOGY WITH ANESTHESIA N/A 03/02/2019   Procedure: IR WITH ANESTHESIA;  Surgeon: Luanne Bras, MD;  Location: [facility];  Service: Radiology;  Laterality: N/A;   Past Surgical History:  Procedure Laterality Date   ABDOMINAL HYSTERECTOMY     B oophorectomy for BRCA1 gene   BREAST SURGERY     CESAREAN SECTION     INCISION AND DRAINAGE ABSCESS Left 11/14/2012   Procedure: INCISION AND DRAINAGE ABSCESS LEFT GROIN;  Surgeon: Jamesetta So, MD;  Location: [facility];  Service: General;  Laterality: Left;   INGUINAL LYMPH NODE BIOPSY Right 11/23/2019   Procedure: INGUINAL LYMPH NODE BIOPSY;  Surgeon: Aviva Signs, MD;  Location: [facility];  Service: General;  Laterality: Right;   IR ANGIO VERTEBRAL SEL SUBCLAVIAN INNOMINATE UNI R MOD SED  03/02/2019   IR CT HEAD LTD  03/02/2019   IR INTRAVSC STENT CERV CAROTID W/O EMB-PROT MOD SED INC ANGIO  03/02/2019   IR PERCUTANEOUS ART THROMBECTOMY/INFUSION INTRACRANIAL INC DIAG ANGIO  03/02/2019   LYMPHADENECTOMY     MASTECTOMY Bilateral    RADIOLOGY WITH ANESTHESIA N/A 03/02/2019   Procedure: IR WITH ANESTHESIA;  Surgeon: Luanne Bras, MD;  Location: [place];  Service: Radiology;  Laterality: N/A;   Past Medical History:  Diagnosis Date   Allergy    Anxiety    Asthma    Back pain    Breast cancer (HCC)     Cancer ([place])    Depression    Diabetes mellitus    Headache    Hyperlipemia    Osteopenia    Osteoporosis    osteopenia   Stroke (HCC)    BP 111/73   Pulse 69   Temp 97.6 F (36.4 C)   Ht _0  (1.575 m)   Wt 150 lb (68 kg)  SpO2 99%   BMI 27.44 kg/m   Opioid Risk Score:   Fall Risk Score:  `1  Depression screen [facility] 2/9     02/19/2022    2:39 PM 11/16/2021   11:12 AM 08/15/2021   10:48 AM 12/01/2020   10:15 AM 01/04/2016   10:17 AM 12/06/2015   11:13 AM 10/27/2015    6:14 PM  Depression screen PHQ 2/9  Decreased Interest _0 0 0 0  Down, Depressed, Hopeless _1 0 0 1  PHQ - 2 Score _2 0 0 1    Review of Systems  Constitutional: Negative.   HENT: Negative.    Eyes:  Positive for visual disturbance.  Respiratory: Negative.    Cardiovascular: Negative.   Gastrointestinal: Negative.   Endocrine: Negative.   Genitourinary: Negative.   Musculoskeletal:  Positive for arthralgias.  Skin: Negative.   Allergic/Immunologic: Negative.   Neurological: Negative.   Hematological:  Bruises/bleeds easily.       [place]  Psychiatric/Behavioral:  Positive for dysphoric mood.   All other systems reviewed and are negative.     Objective:   Physical Exam Gen: no distress, normal appearing HEENT: oral mucosa pink and moist, NCAT Cardio: Reg rate Chest: normal effort, normal rate of breathing Abd: soft, non-distended Ext: no edema Psych: pleasant, normal affect Skin: intact Neuro: Alert and oriented x3. Musculoskeletal: left shoulder with 4/5 SAB and severely limited external rotation. Otherwise with 5/5 strength throughout which is great improvement from last visit! Tight cervical musculature- no significant trigger points. Decrease range of motion in left shoulder.  Psych: pleasant, normal affect, much more positive than last visit!     Assessment & Plan:  Ann. Sachs is a 51 year old woman who presents for f/u of shoulder pain post-stroke.   1) Adhesive  capsulitis of left shoulder: -He strength is much improved since last visit and she does not exhibit significant LUE spasticity. I think her pain is most likely due to frozen shoulder, especially given her severely limited external rotation.  -Can repeat steroid injection and/or nerve block in 1 month. Discussed risks of repeat steroid injections. -SPRINT PNS has provided improvement in pain in axillary distribution. Device removed successfully today. Discussed that we can consider suprascapular nerve in the future if pain becomes severe.  -Continue PRN meloxicam, lidocaine patch, and diclofenac gel for pain relief in interim.  -prescribed OT for range of motion for her left shoulder  2) Impaired mobility and ADLs -Continue daily 45 minute walk -Advised to lift weights to maintain muscle mass and good metabolism.   3) Insomnia: -Recommended walk outside every morning to help reset circadian rhythm -Commended on weaning off Temazepam -tizanidine 81m HS Insomnia: -Try to go outside near sunrise -Get exercise during the day.  -Turn off all devices an hour before bedtime.  -Teas that can benefit: chamomile, valerian root, Brahmi (Bacopa) -Can consider over the counter melatonin, magnesium, and/or L-theanine. Melatonin is an anti-oxidant with multiple health benefits. Magnesium is involved in greater than 300 enzymatic reactions in the body and most of [place] deficient as our soil is often depleted. There are 7 different types of magnesium- Bioptemizer's is a supplement with all 7 types, and each has unique benefits. Magnesium can also help with constipation and anxiety.  -Pistachios naturally increase the production of melatonin -[employer] bamboo bed sheets are free from toxic chemicals.  -Tart cherry juice or a tart cherry supplement can improve sleep and  soreness post-workout    4) Depression: -Advised that exercise will help with her depression as well.   5) Spasticity:  -100U Botox to  left toe flexors to help with toe curling -keep up stretching -cannot tolerate side effects of the baclofen.  Discussed extracorporeal shockwave therapy as a modality for treatment. Discussed that the device looks and feels like a massage gun and I would move it over the area of pain for about 10 minutes. The device releases sound waves to the area of pain and helps to improve blood flow and circulation to improve the healing process. Discuss that this initially induces inflammation and can sometimes cause short-term increase in pain. Discussed that we typically do three weekly treatments, but sometimes up to 6 if needed, and after 6 weeks long term benefits can sometimes be achieved. Discussed that this is an FDA approved device, but not covered by insurance and would cost $60 per session. Will scheduled patient for 6 consecutive appointments and can cancel latter three if benefits are achieved after first three sessions.    6) Breast cancer: -13 years in remission -discussed benefits of ketogenic diet.   7) Prediabetes: -try to incorporate into your diet some of the following foods which are good for diabetes: 1) cinnamon- imitates effects of insulin, increasing glucose transport into cells ([place] or [place] cinnamon is best, least processed) 2) nuts- can slow down the blood sugar response of carbohydrate rich foods 3) oatmeal- contains and anti-inflammatory compound avenanthramide 4) whole-milk yogurt (best types are no sugar, [place] yogurt, or goat/sheep yogurt) 5) beans- high in protein, fiber, and vitamins, low glycemic index 6) broccoli- great source of vitamin A and C 7) quinoa- higher in protein and fiber than other grains 8) spinach- high in vitamin A, fiber, and protein 9) olive oil- reduces glucose levels, LDL, and triglycerides 10) salmon- excellent amount of omega-3-fatty acids 11) walnuts- rich in antioxidants 12) apples- high in fiber and quercetin 13) carrots- highly  nutritious with low impact on blood sugar 14) eggs- improve HDL (good cholesterol), high in protein, keep you satiated 15) turmeric: improves blood sugars, cardiovascular disease, and protects kidney health 16) garlic: improves blood sugar, blood pressure, pain 17) tomatoes: highly nutritious with low impact on blood sugar  8) Dysthymia -neurospych appointment scheduled with dr. Sima Matas  9) Pseuobulbar affect -discussed Nuedexta- she would like to try- prescribed

## 2022-03-23 ENCOUNTER — Telehealth: Payer: Self-pay

## 2022-03-23 NOTE — Telephone Encounter (Signed)
PA for Nuedexta submitted.

## 2022-04-10 ENCOUNTER — Telehealth: Payer: Self-pay | Admitting: Physical Medicine and Rehabilitation

## 2022-04-10 NOTE — Telephone Encounter (Signed)
Needing to review medication appeal request for Nuedexta, please call [phone] ref case #[id number]

## 2022-04-11 NOTE — Telephone Encounter (Signed)
done

## 2022-04-26 ENCOUNTER — Encounter: Payer: Self-pay | Admitting: Physical Medicine and Rehabilitation

## 2022-05-03 ENCOUNTER — Encounter: Payer: Self-pay | Admitting: Physical Medicine and Rehabilitation

## 2022-05-08 ENCOUNTER — Telehealth (HOSPITAL_COMMUNITY): Payer: Self-pay

## 2022-05-08 NOTE — Telephone Encounter (Signed)
Called to schedule US carotid, no answer, left vm. AB

## 2022-05-10 ENCOUNTER — Encounter: Payer: Self-pay | Admitting: Physical Medicine and Rehabilitation

## 2022-05-17 ENCOUNTER — Encounter: Payer: Self-pay | Admitting: Physical Medicine and Rehabilitation

## 2022-05-24 ENCOUNTER — Encounter: Payer: Self-pay | Admitting: Physical Medicine and Rehabilitation

## 2022-05-29 ENCOUNTER — Other Ambulatory Visit (HOSPITAL_COMMUNITY): Payer: Self-pay | Admitting: Interventional Radiology

## 2022-05-29 DIAGNOSIS — I639 Cerebral infarction, unspecified: Secondary | ICD-10-CM

## 2022-05-31 ENCOUNTER — Encounter: Payer: Self-pay | Admitting: Physical Medicine and Rehabilitation

## 2022-06-20 ENCOUNTER — Ambulatory Visit (HOSPITAL_COMMUNITY): Payer: 59

## 2022-06-25 ENCOUNTER — Ambulatory Visit (HOSPITAL_COMMUNITY)
Admission: RE | Admit: 2022-06-25 | Discharge: 2022-06-25 | Disposition: A | Discharge status: Home / Self Care | Payer: 59 | Source: Ambulatory Visit | Attending: Interventional Radiology | Admitting: Interventional Radiology

## 2022-06-25 DIAGNOSIS — I639 Cerebral infarction, unspecified: Secondary | ICD-10-CM | POA: Diagnosis present

## 2022-06-25 NOTE — Progress Notes (Signed)
VASCULAR LAB    Carotid duplex has been performed.  See CV proc for preliminary results.   Monna Crean, RVT 06/25/2022, 4:04 PM

## 2022-07-03 ENCOUNTER — Telehealth (HOSPITAL_COMMUNITY): Payer: Self-pay

## 2022-07-03 NOTE — Telephone Encounter (Signed)
Called pt regarding recent imaging, no answer, left vm. AB  

## 2022-07-04 ENCOUNTER — Telehealth (HOSPITAL_COMMUNITY): Payer: Self-pay

## 2022-07-04 NOTE — Telephone Encounter (Signed)
Pt agreed to f/u in 6 months with a US carotid. Per Dr. Estanislado Pandy, if hat scan is stable then he will start doing a yearly f/u. AB

## 2022-07-17 ENCOUNTER — Encounter: Payer: 59 | Attending: Physical Medicine and Rehabilitation | Admitting: Physical Medicine and Rehabilitation

## 2022-07-17 VITALS — BP 114/75 | HR 70 | Ht 62.0 in | Wt 151.2 lb

## 2022-07-17 DIAGNOSIS — R252 Cramp and spasm: Secondary | ICD-10-CM

## 2022-07-17 DIAGNOSIS — I69398 Other sequelae of cerebral infarction: Secondary | ICD-10-CM

## 2022-07-17 NOTE — Progress Notes (Signed)
5-10 Hz Power level 60-90MJ  Currently toe curling is constant and foot is inverted

## 2022-07-24 ENCOUNTER — Encounter: Payer: 59 | Admitting: Physical Medicine and Rehabilitation

## 2022-07-30 ENCOUNTER — Encounter: Payer: 59 | Admitting: Physical Medicine and Rehabilitation

## 2022-08-01 ENCOUNTER — Other Ambulatory Visit: Payer: Self-pay

## 2022-08-01 ENCOUNTER — Encounter (HOSPITAL_COMMUNITY): Payer: Self-pay

## 2022-08-01 ENCOUNTER — Emergency Department (HOSPITAL_COMMUNITY)
Admission: EM | Admit: 2022-08-01 | Discharge: 2022-08-01 | Disposition: A | Discharge status: Home / Self Care | Payer: 59 | Attending: Emergency Medicine | Admitting: Emergency Medicine

## 2022-08-01 ENCOUNTER — Emergency Department (HOSPITAL_COMMUNITY): Payer: 59

## 2022-08-01 DIAGNOSIS — M5441 Lumbago with sciatica, right side: Secondary | ICD-10-CM | POA: Diagnosis not present

## 2022-08-01 DIAGNOSIS — Z7982 Long term (current) use of aspirin: Secondary | ICD-10-CM | POA: Insufficient documentation

## 2022-08-01 DIAGNOSIS — M25511 Pain in right shoulder: Secondary | ICD-10-CM | POA: Diagnosis not present

## 2022-08-01 DIAGNOSIS — Y92410 Unspecified street and highway as the place of occurrence of the external cause: Secondary | ICD-10-CM | POA: Insufficient documentation

## 2022-08-01 DIAGNOSIS — S161XXA Strain of muscle, fascia and tendon at neck level, initial encounter: Secondary | ICD-10-CM | POA: Insufficient documentation

## 2022-08-01 DIAGNOSIS — Z8673 Personal history of transient ischemic attack (TIA), and cerebral infarction without residual deficits: Secondary | ICD-10-CM | POA: Insufficient documentation

## 2022-08-01 DIAGNOSIS — Z7902 Long term (current) use of antithrombotics/antiplatelets: Secondary | ICD-10-CM | POA: Insufficient documentation

## 2022-08-01 DIAGNOSIS — M25512 Pain in left shoulder: Secondary | ICD-10-CM | POA: Insufficient documentation

## 2022-08-01 DIAGNOSIS — E119 Type 2 diabetes mellitus without complications: Secondary | ICD-10-CM | POA: Diagnosis not present

## 2022-08-01 DIAGNOSIS — S199XXA Unspecified injury of neck, initial encounter: Secondary | ICD-10-CM | POA: Diagnosis present

## 2022-08-01 MED ORDER — PREDNISONE 10 MG PO TABS: ORAL_TABLET | ORAL | 0 refills | Status: AC

## 2022-08-01 MED ORDER — METHOCARBAMOL 500 MG PO TABS: 500.0000 mg | ORAL_TABLET | Freq: Three times a day (TID) | ORAL | 0 refills | Status: AC

## 2022-08-01 MED ORDER — KETOROLAC TROMETHAMINE 30 MG/ML IJ SOLN
30.0000 mg | Freq: Once | INTRAMUSCULAR | Status: AC
  Administered 2022-08-01: 30 mg via INTRAMUSCULAR
  Filled 2022-08-01: qty 1

## 2022-08-01 NOTE — ED Notes (Signed)
Dc instructions and scripts reviewed with pt no questions or concerns at this time. Will follow up as needed ?

## 2022-08-01 NOTE — Discharge Instructions (Signed)
Try alternating ice and heat to your lower back and neck.  Avoid bending twisting or heavy lifting for at least 1 week.  Take the medication as directed.  Please follow-up with your pain management provider or your primary care physician.  I have also listed your orthopedic provider that you may arrange follow-up with as well.  Please return to the emergency department for any new or worsening symptoms.
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
## 2022-08-07 ENCOUNTER — Encounter: Payer: 59 | Admitting: Physical Medicine and Rehabilitation

## 2022-08-14 ENCOUNTER — Ambulatory Visit: Payer: 59 | Admitting: Physical Medicine and Rehabilitation

## 2022-08-21 ENCOUNTER — Ambulatory Visit: Payer: 59 | Admitting: Physical Medicine and Rehabilitation

## 2022-12-13 ENCOUNTER — Other Ambulatory Visit (HOSPITAL_COMMUNITY): Payer: Self-pay | Admitting: Interventional Radiology

## 2022-12-13 DIAGNOSIS — I771 Stricture of artery: Secondary | ICD-10-CM

## 2023-01-03 ENCOUNTER — Ambulatory Visit (HOSPITAL_COMMUNITY)
Admission: RE | Admit: 2023-01-03 | Discharge: 2023-01-03 | Disposition: A | Discharge status: Home / Self Care | Payer: 59 | Source: Ambulatory Visit | Attending: Interventional Radiology | Admitting: Interventional Radiology

## 2023-01-03 DIAGNOSIS — I771 Stricture of artery: Secondary | ICD-10-CM | POA: Diagnosis present

## 2023-01-03 NOTE — Progress Notes (Signed)
VASCULAR LAB    Carotid duplex has been performed.  See CV proc for preliminary results.   Ever Gustafson, RVT 01/03/2023, 9:32 AM

## 2023-01-14 ENCOUNTER — Telehealth (HOSPITAL_COMMUNITY): Payer: Self-pay

## 2023-01-14 NOTE — Telephone Encounter (Signed)
Pt agreed to f/u in 1 year with an ultrasound carotid. AB

## 2023-01-14 NOTE — Telephone Encounter (Signed)
Called pt regarding recent imaging, no answer, left vm. AB  

## 2023-02-05 ENCOUNTER — Other Ambulatory Visit (HOSPITAL_COMMUNITY): Payer: Self-pay | Admitting: Nurse Practitioner

## 2023-02-05 DIAGNOSIS — Z122 Encounter for screening for malignant neoplasm of respiratory organs: Secondary | ICD-10-CM

## 2023-02-08 ENCOUNTER — Other Ambulatory Visit: Payer: Self-pay | Admitting: Family Medicine

## 2023-02-08 DIAGNOSIS — E8940 Asymptomatic postprocedural ovarian failure: Secondary | ICD-10-CM

## 2023-02-14 ENCOUNTER — Ambulatory Visit (HOSPITAL_COMMUNITY)
Admission: RE | Admit: 2023-02-14 | Discharge: 2023-02-14 | Disposition: A | Discharge status: Home / Self Care | Payer: 59 | Source: Ambulatory Visit | Attending: Nurse Practitioner | Admitting: Nurse Practitioner

## 2023-02-14 DIAGNOSIS — K8020 Calculus of gallbladder without cholecystitis without obstruction: Secondary | ICD-10-CM | POA: Diagnosis not present

## 2023-02-14 DIAGNOSIS — Z87891 Personal history of nicotine dependence: Secondary | ICD-10-CM | POA: Insufficient documentation

## 2023-02-14 DIAGNOSIS — J439 Emphysema, unspecified: Secondary | ICD-10-CM | POA: Diagnosis not present

## 2023-02-14 DIAGNOSIS — Z122 Encounter for screening for malignant neoplasm of respiratory organs: Secondary | ICD-10-CM | POA: Diagnosis present

## 2023-08-21 ENCOUNTER — Other Ambulatory Visit: Payer: 59

## 2023-10-04 ENCOUNTER — Encounter (HOSPITAL_COMMUNITY): Payer: Self-pay | Admitting: Interventional Radiology

## 2023-12-19 ENCOUNTER — Ambulatory Visit (HOSPITAL_COMMUNITY)
Admission: RE | Admit: 2023-12-19 | Discharge: 2023-12-19 | Disposition: A | Discharge status: Home / Self Care | Source: Ambulatory Visit | Attending: Vascular Surgery | Admitting: Vascular Surgery

## 2023-12-19 ENCOUNTER — Other Ambulatory Visit (HOSPITAL_COMMUNITY): Payer: Self-pay | Admitting: Family Medicine

## 2023-12-19 DIAGNOSIS — I639 Cerebral infarction, unspecified: Secondary | ICD-10-CM

## 2023-12-19 DIAGNOSIS — I69354 Hemiplegia and hemiparesis following cerebral infarction affecting left non-dominant side: Secondary | ICD-10-CM | POA: Diagnosis not present
